# Patient Record
Sex: Female | Born: 1945 | Race: White | Hispanic: No | Smoking: Former smoker
Health system: Southern US, Community
[De-identification: ages and names within clinical notes are randomized; demographics above are authoritative.]

## PROBLEM LIST (undated history)

## (undated) DIAGNOSIS — I82409 Acute embolism and thrombosis of unspecified deep veins of unspecified lower extremity: Secondary | ICD-10-CM

## (undated) DIAGNOSIS — R112 Nausea with vomiting, unspecified: Secondary | ICD-10-CM

## (undated) DIAGNOSIS — S3992XA Unspecified injury of lower back, initial encounter: Secondary | ICD-10-CM

## (undated) DIAGNOSIS — K3189 Other diseases of stomach and duodenum: Secondary | ICD-10-CM

## (undated) DIAGNOSIS — I10 Essential (primary) hypertension: Secondary | ICD-10-CM

## (undated) DIAGNOSIS — I484 Atypical atrial flutter: Secondary | ICD-10-CM

## (undated) DIAGNOSIS — D490 Neoplasm of unspecified behavior of digestive system: Secondary | ICD-10-CM

## (undated) DIAGNOSIS — D689 Coagulation defect, unspecified: Secondary | ICD-10-CM

## (undated) DIAGNOSIS — M109 Gout, unspecified: Secondary | ICD-10-CM

## (undated) DIAGNOSIS — R1013 Epigastric pain: Secondary | ICD-10-CM

## (undated) DIAGNOSIS — M069 Rheumatoid arthritis, unspecified: Secondary | ICD-10-CM

## (undated) DIAGNOSIS — K269 Duodenal ulcer, unspecified as acute or chronic, without hemorrhage or perforation: Secondary | ICD-10-CM

## (undated) DIAGNOSIS — F411 Generalized anxiety disorder: Secondary | ICD-10-CM

## (undated) DIAGNOSIS — K8309 Other cholangitis: Secondary | ICD-10-CM

## (undated) DIAGNOSIS — E039 Hypothyroidism, unspecified: Secondary | ICD-10-CM

## (undated) DIAGNOSIS — I4891 Unspecified atrial fibrillation: Secondary | ICD-10-CM

## (undated) DIAGNOSIS — K219 Gastro-esophageal reflux disease without esophagitis: Secondary | ICD-10-CM

## (undated) HISTORY — PX: ABDOMINAL HYSTERECTOMY: SHX81

## (undated) HISTORY — DX: Coagulation defect, unspecified: D68.9

## (undated) HISTORY — DX: Duodenal ulcer, unspecified as acute or chronic, without hemorrhage or perforation: K26.9

## (undated) HISTORY — DX: Other diseases of stomach and duodenum: K31.89

## (undated) HISTORY — DX: Unspecified injury of lower back, initial encounter: S39.92XA

## (undated) HISTORY — DX: Epigastric pain: R10.13

## (undated) HISTORY — PX: CHOLECYSTECTOMY: SHX55

## (undated) HISTORY — DX: Nausea with vomiting, unspecified: R11.2

## (undated) HISTORY — DX: Essential (primary) hypertension: I10

## (undated) HISTORY — DX: Other cholangitis: K83.09

## (undated) HISTORY — DX: Neoplasm of unspecified behavior of digestive system: D49.0

## (undated) HISTORY — PX: HERNIA REPAIR: SHX51

## (undated) HISTORY — DX: Atypical atrial flutter: I48.4

---

## 2015-08-18 ENCOUNTER — Encounter (HOSPITAL_COMMUNITY): Payer: Self-pay | Admitting: Emergency Medicine

## 2015-08-18 ENCOUNTER — Emergency Department (HOSPITAL_COMMUNITY)
Admission: EM | Admit: 2015-08-18 | Discharge: 2015-08-18 | Disposition: A | Discharge status: Home / Self Care | Payer: Medicare Other | Attending: Emergency Medicine | Admitting: Emergency Medicine

## 2015-08-18 ENCOUNTER — Other Ambulatory Visit (HOSPITAL_COMMUNITY): Payer: Self-pay | Admitting: Rheumatology

## 2015-08-18 ENCOUNTER — Ambulatory Visit (HOSPITAL_BASED_OUTPATIENT_CLINIC_OR_DEPARTMENT_OTHER)
Admission: RE | Admit: 2015-08-18 | Discharge: 2015-08-18 | Disposition: A | Discharge status: Home / Self Care | Payer: Medicare Other | Source: Ambulatory Visit | Attending: Rheumatology | Admitting: Rheumatology

## 2015-08-18 ENCOUNTER — Other Ambulatory Visit: Payer: Self-pay | Admitting: Rheumatology

## 2015-08-18 DIAGNOSIS — M79661 Pain in right lower leg: Secondary | ICD-10-CM

## 2015-08-18 DIAGNOSIS — M79604 Pain in right leg: Secondary | ICD-10-CM | POA: Insufficient documentation

## 2015-08-18 DIAGNOSIS — I82431 Acute embolism and thrombosis of right popliteal vein: Secondary | ICD-10-CM

## 2015-08-18 DIAGNOSIS — M7989 Other specified soft tissue disorders: Principal | ICD-10-CM

## 2015-08-18 DIAGNOSIS — I82491 Acute embolism and thrombosis of other specified deep vein of right lower extremity: Secondary | ICD-10-CM | POA: Insufficient documentation

## 2015-08-18 DIAGNOSIS — I82441 Acute embolism and thrombosis of right tibial vein: Secondary | ICD-10-CM | POA: Insufficient documentation

## 2015-08-18 DIAGNOSIS — R609 Edema, unspecified: Secondary | ICD-10-CM

## 2015-08-18 DIAGNOSIS — I82411 Acute embolism and thrombosis of right femoral vein: Secondary | ICD-10-CM | POA: Insufficient documentation

## 2015-08-18 DIAGNOSIS — I10 Essential (primary) hypertension: Secondary | ICD-10-CM | POA: Insufficient documentation

## 2015-08-18 DIAGNOSIS — L539 Erythematous condition, unspecified: Secondary | ICD-10-CM

## 2015-08-18 HISTORY — DX: Essential (primary) hypertension: I10

## 2015-08-18 HISTORY — DX: Rheumatoid arthritis, unspecified: M06.9

## 2015-08-18 HISTORY — DX: Gout, unspecified: M10.9

## 2015-08-18 NOTE — ED Notes (Signed)
Pt. reports right leg DVT with pain for 4 days , vascular ultrasound done this evening , denies fever / no SOB . Right pedal pulses present.

## 2015-08-18 NOTE — ED Notes (Signed)
This RN went to pull patient from the lobby, and patient's daughter was with her.  [name] was on phone with PCP and discussed patient's symptoms.  Patient now has an appointment for tomorrow morning with her PCP and daughter states "prescriptions are being called in".  This RN asked patient and daughter if they were sure they wanted to leave, and patient and daughter agreed.

## 2015-08-18 NOTE — Progress Notes (Signed)
VASCULAR LAB PRELIMINARY  PRELIMINARY  PRELIMINARY  PRELIMINARY  Bilateral lower extremity venous duplex completed.     Right:acute thrombus  DVT noted in femoral vein popliteal vein PTV and peroneal veins .  No evidence of superficial thrombosis.  No Baker's cyst.  Left:  No evidence of DVT, superficial thrombosis, or Baker's cyst. Called doctor with results @6 :30 pm  Kedric Bumgarner, RVT, RDMS 08/18/2015, 6:27 PM

## 2015-09-02 ENCOUNTER — Telehealth: Payer: Self-pay | Admitting: Hematology and Oncology

## 2015-09-02 NOTE — Telephone Encounter (Signed)
Completed pt intake. Pt aware of appt 09/07/15 @2 :00

## 2015-09-07 ENCOUNTER — Encounter: Payer: Self-pay | Admitting: Hematology and Oncology

## 2015-09-07 ENCOUNTER — Ambulatory Visit (HOSPITAL_BASED_OUTPATIENT_CLINIC_OR_DEPARTMENT_OTHER): Payer: Medicare Other | Admitting: Hematology and Oncology

## 2015-09-07 VITALS — BP 136/73 | HR 69 | Temp 98.0°F | Resp 17 | Ht 61.0 in | Wt 231.4 lb

## 2015-09-07 DIAGNOSIS — I82401 Acute embolism and thrombosis of unspecified deep veins of right lower extremity: Secondary | ICD-10-CM | POA: Diagnosis not present

## 2015-09-07 DIAGNOSIS — M0579 Rheumatoid arthritis with rheumatoid factor of multiple sites without organ or systems involvement: Secondary | ICD-10-CM | POA: Diagnosis not present

## 2015-09-07 DIAGNOSIS — M069 Rheumatoid arthritis, unspecified: Secondary | ICD-10-CM | POA: Insufficient documentation

## 2015-09-07 NOTE — Assessment & Plan Note (Signed)
The recent right lower extremity DVT could be provoked by mild dehydration and possibly presence of lupus anticoagulant, although unproven. Even though I did not order the blood test to confirm this, the presence of strong evidence of rheumatoid factor and anti-CCP antibiotic in 2015 suggested that she may have lingering lupus anticoagulant, especially the fact that she is not treated. We discussed about the pros and cons about testing for thrombophilia disorder/lupus anticoagulant teating. her current anticoagulation therapy will interfere with some the tests and it is not possible to interpret the test results.  I do not see a reason to order excessive testing to screen for thrombophilia disorder as it would not change our management. With her significant obesity, recurrent dehydration and untreated rheumatoid arthritis made me very concerned about recurrent blood clots. Her risk of recurrence of blood clot if we discontinue anticoagulation therapy after 3 months could be as high as 20% or more. After prolonged discussion, where in agreement for her to remain on anticoagulation indefinitely. First 3 months of anticoagulation therapy is to treat her recent blood clot and any pain beyond that is for secondary prevention. She needs minimum twice a year CBC and renal function study for close monitoring as Eliquis can be associated with 3% risk of bleeding and is cleared by the kidneys. If her creatinine clearance dropped under 35 ml/min, I recommend reducing the dose to 2.5 mg twice a day. Finally, at the end of our consultation today, I reinforced the importance of preventive strategies such as avoiding hormonal supplement, avoiding cigarette smoking, keeping up-to-date with screening programs for early cancer detection, frequent ambulation for long distance travel and aggressive DVT prophylaxis in all surgical settings. I have not made a return appointment for the patient to come back.

## 2015-09-07 NOTE — Progress Notes (Signed)
[facility] NOTE  Patient Care Team: Thurman Coyer, MD as PCP - General (Internal Medicine) Valinda Party, MD as Consulting Physician (Rheumatology)  CHIEF COMPLAINTS/PURPOSE OF CONSULTATION:  Acute right lower extremity DVT  HISTORY OF PRESENTING ILLNESS:  Peggy Armstrong 70 y.o. 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 In 2015, she tested positive for rheumatoid factor and anti-CCP antibodies and saw a rheumatologist but was never started on treatment for rheumatoid arthritis She has been complaining of intermittent right lower extremity pain around the knee and the back of the knee since January 2017. She was originally diagnosed with an acute gout attack but subsequently saw a different physician for follow-up and venous Doppler was ordered on 08/18/2015 which showed findings consistent with acute deep vein thrombosis involving the right lower extremity. Acute thrombus identified in right femoral vein, popliteal vein, posterior tibial veins and peroneal veins. She denies immobility, history of trauma, long distance travel, recent surgery, smoking or prolonged immobilization. She was anticoagulated with Eliquis. The patient denies any recent signs or symptoms of bleeding such as spontaneous epistaxis, hematuria or hematochezia.  She denies further lower extremity swelling, warmth, tenderness & erythema.  She denies recent chest pain on exertion, shortness of breath on minimal exertion, pre-syncopal episodes, hemoptysis, or palpitation. She had no prior history or diagnosis of cancer. Her age appropriate screening programs are not up-to-date as she refuses mammogram and colonoscopy. She had prior surgeries before and never had perioperative thromboembolic events. The patient had been exposed to birth control pills and never had thrombotic events. The patient had been  pregnant before and denies history of peripartum thromboembolic event or history of recurrent miscarriages. There is no family history of blood clots or miscarriages.  MEDICAL HISTORY:    SURGICAL HISTORY: Past Surgical History  Procedure Laterality Date  . Cholecystectomy    . Abdominal hysterectomy    . Hernia repair      SOCIAL HISTORY: Social History   Social History  . Marital Status: Divorced    Spouse Name: N/A  . Number of Children: N/A  . Years of Education: N/A   Occupational History  . Not on file.   Social History Main Topics  . Smoking status: Never Smoker   . Smokeless tobacco: Never Used  . Alcohol Use: No  . Drug Use: No  . 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   Other Topics Concern  . Not on file   Social History Narrative    FAMILY HISTORY: Family History  Problem Relation Age of Onset  . Cancer Father     prostate ca    ALLERGIES:  is allergic to codeine.  MEDICATIONS:  Current Outpatient Prescriptions  Medication Sig Dispense Refill  . allopurinol (ZYLOPRIM) 300 MG tablet Take 450 mg by mouth daily.    Marland Kitchen ALPRAZolam (XANAX) 0.25 MG tablet Take 0.25 mg by mouth 3 (three) times daily as needed.  2  . apixaban (ELIQUIS) 5 MG TABS tablet Take 5 mg by mouth 2 (two) times daily.    . chlorthalidone (HYGROTON) 25 MG tablet Take 25 mg by mouth daily.    . cholecalciferol (VITAMIN D) 1000 units tablet Take 1,000 Units by mouth daily.    . cloNIDine (CATAPRES) 0.2 MG tablet Take 0.2 mg by mouth 2 (two) times daily.    Marland Kitchen diltiazem (CARDIZEM  CD) 300 MG 24 hr capsule Take 300 mg by mouth daily.    Marland Kitchen lisinopril (PRINIVIL,ZESTRIL) 10 MG tablet Take 10 mg by mouth daily.    . vitamin B-12 (CYANOCOBALAMIN) 1000 MCG tablet Take 1,000 mcg by mouth daily.     No current facility-administered medications for this visit.    REVIEW OF SYSTEMS:   Constitutional: Denies fevers, chills or abnormal night sweats Eyes: Denies  blurriness of vision, double vision or watery eyes Ears, nose, mouth, throat, and face: Denies mucositis or sore throat Respiratory: Denies cough, dyspnea or wheezes Cardiovascular: Denies palpitation, chest discomfort  Gastrointestinal:  Denies nausea, heartburn or change in bowel habits Skin: Denies abnormal skin rashes Lymphatics: Denies new lymphadenopathy or easy bruising Neurological:Denies numbness, tingling or new weaknesses Behavioral/Psych: Mood is stable, no new changes  All other systems were reviewed with the patient and are negative.  PHYSICAL EXAMINATION: ECOG PERFORMANCE STATUS: 1 - Symptomatic but completely ambulatory  Filed Vitals:   09/07/15 1407  BP: 136/73  Pulse: 69  Temp: 98 F (36.7 C)  Resp: 17   Filed Weights   09/07/15 1407  Weight: 231 lb 6.4 oz (104.962 kg)    GENERAL:alert, no distress and comfortable. She is morbidly obese SKIN: skin color, texture, turgor are normal, no rashes or significant lesions EYES: normal, conjunctiva are pink and non-injected, sclera clear OROPHARYNX:no exudate, no erythema and lips, buccal mucosa, and tongue normal  NECK: supple, thyroid normal size, non-tender, without nodularity LYMPH:  no palpable lymphadenopathy in the cervical, axillary or inguinal LUNGS: clear to auscultation and percussion with normal breathing effort HEART: regular rate & rhythm and no murmurs and no lower extremity edema ABDOMEN:abdomen soft, non-tender and normal bowel sounds Musculoskeletal:no cyanosis of digits and no clubbing  PSYCH: alert & oriented x 3 with fluent speech NEURO: no focal motor/sensory deficits  LABORATORY DATA:  I have reviewed the data as listed  ASSESSMENT & PLAN:  I reviewed with the patient about the plan for care for acute right DVT.  Right leg DVT (HCC) The recent right lower extremity DVT could be provoked by mild dehydration and possibly presence of lupus anticoagulant, although unproven. Even though I did  not order the blood test to confirm this, the presence of strong evidence of rheumatoid factor and anti-CCP antibiotic in 2015 suggested that she may have lingering lupus anticoagulant, especially the fact that she is not treated. We discussed about the pros and cons about testing for thrombophilia disorder/lupus anticoagulant teating. her current anticoagulation therapy will interfere with some the tests and it is not possible to interpret the test results.  I do not see a reason to order excessive testing to screen for thrombophilia disorder as it would not change our management. With her significant obesity, recurrent dehydration and untreated rheumatoid arthritis made me very concerned about recurrent blood clots. Her risk of recurrence of blood clot if we discontinue anticoagulation therapy after 3 months could be as high as 20% or more. After prolonged discussion, where in agreement for her to remain on anticoagulation indefinitely. First 3 months of anticoagulation therapy is to treat her recent blood clot and any pain beyond that is for secondary prevention. She needs minimum twice a year CBC and renal function study for close monitoring as Eliquis can be associated with 3% risk of bleeding and is cleared by the kidneys. If her creatinine clearance dropped under 35 ml/min, I recommend reducing the dose to 2.5 mg twice a day. Finally, at the end  of our consultation today, I reinforced the importance of preventive strategies such as avoiding hormonal supplement, avoiding cigarette smoking, keeping up-to-date with screening programs for early cancer detection, frequent ambulation for long distance travel and aggressive DVT prophylaxis in all surgical settings. I have not made a return appointment for the patient to come back.   RA (rheumatoid arthritis) ([place]) She is not symptomatic is currently taking calcium and vitamin D only. I would defer to her primary care doctor and rheumatologist for further  follow-up. Even though not proven, I suspect she may have lupus anticoagulant that may have precipitated recent diagnosis of acute DVT.    All questions were answered. The patient knows to call the clinic with any problems, questions or concerns. I spent 40 minutes counseling the patient face to face. The total time spent in the appointment was 55 minutes and more than 50% was on counseling.     [facility], Jaramiah Bossard, MD 09/07/2015 3:07 PM

## 2015-09-07 NOTE — Assessment & Plan Note (Signed)
She is not symptomatic is currently taking calcium and vitamin D only. I would defer to her primary care doctor and rheumatologist for further follow-up. Even though not proven, I suspect she may have lupus anticoagulant that may have precipitated recent diagnosis of acute DVT.

## 2016-08-01 ENCOUNTER — Telehealth: Payer: Self-pay

## 2016-08-01 NOTE — Telephone Encounter (Signed)
Patient called and states  Dr. Thea Silversmith wants to refer her to get IV iron, and made a referral to [facility] But she does not want to see anyone in [place]. She has seen Dr. Alvy Bimler in the past and would like to come her to get her iron. Dr. Alvy Bimler last saw her in March 2017.

## 2016-08-02 ENCOUNTER — Telehealth: Payer: Self-pay | Admitting: Hematology and Oncology

## 2016-08-02 ENCOUNTER — Other Ambulatory Visit: Payer: Self-pay | Admitting: Hematology and Oncology

## 2016-08-02 DIAGNOSIS — D500 Iron deficiency anemia secondary to blood loss (chronic): Secondary | ICD-10-CM

## 2016-08-02 DIAGNOSIS — D509 Iron deficiency anemia, unspecified: Secondary | ICD-10-CM

## 2016-08-02 HISTORY — DX: Iron deficiency anemia, unspecified: D50.9

## 2016-08-02 NOTE — Telephone Encounter (Signed)
I have placed scheduling msg to see her on 2/15 for labs, see me and IV iron

## 2016-08-02 NOTE — Telephone Encounter (Signed)
sw pt to confirm 2/15 appts at 915 am per LOS

## 2016-08-02 NOTE — Telephone Encounter (Signed)
"  My family Practice Dr. Thea Silversmith in [place] says I need sequential  iron infusions. My iron level is low and keeps dropping.  My Hgb = 10 in November and last week Hgb = 10.8.  He was going to send me to a [place] provider but [facility] is convenient.  I live in [place] and my daughter works at [employer].   Dr. Thea Silversmith thinks it's fine for me to see Dr. Alvy Bimler."  Denies chest pain, S.O.B., diaphoresis.  "I feel blah for the past several months.  I have to rest and take breaks after thirty minutes walking in [place], I'm worn out.  I vacuum and rest immediately.   Rest after doing things around the house.  I started iron 65 mg once a day with vitamin C last week.  Return number [phone]."    Will notify provider and also asked she contact PCP for labs, records, treatment ideas/orders to be faxed to [facility] for this new diagnosis.

## 2016-08-03 ENCOUNTER — Telehealth: Payer: Self-pay | Admitting: Hematology and Oncology

## 2016-08-03 NOTE — Telephone Encounter (Signed)
Girard and made aware of 02/15 appt.  Moved office note over into EPIC.

## 2016-08-09 ENCOUNTER — Telehealth: Payer: Self-pay | Admitting: *Deleted

## 2016-08-09 NOTE — Telephone Encounter (Signed)
Pt is very nervous about her upcoming appt. She has a rash on the back of her neck. A shower feels good, she is using dermacell lotion. She will be getting some benadryl cream today. She will see Dr Alvy Bimler on 2/15 before her infusion. She states she feels better for having talked to [place].

## 2016-08-09 NOTE — Telephone Encounter (Signed)
"  How long will I be there for treatment?  My daughter needs to work and if It's not an injection, she can drop me off to go to work." Peggy Armstrong will be in a 100 ml bag.  Appointments begin at 9:15 am and should end by 1:00 pm.

## 2016-08-11 ENCOUNTER — Ambulatory Visit (HOSPITAL_BASED_OUTPATIENT_CLINIC_OR_DEPARTMENT_OTHER): Payer: Medicare Other

## 2016-08-11 ENCOUNTER — Encounter: Payer: Self-pay | Admitting: Hematology and Oncology

## 2016-08-11 ENCOUNTER — Ambulatory Visit (HOSPITAL_BASED_OUTPATIENT_CLINIC_OR_DEPARTMENT_OTHER): Payer: Medicare Other | Admitting: Hematology and Oncology

## 2016-08-11 ENCOUNTER — Other Ambulatory Visit (HOSPITAL_BASED_OUTPATIENT_CLINIC_OR_DEPARTMENT_OTHER): Payer: Medicare Other

## 2016-08-11 ENCOUNTER — Other Ambulatory Visit: Payer: Self-pay | Admitting: Hematology and Oncology

## 2016-08-11 VITALS — BP 117/54 | HR 51 | Temp 97.5°F | Resp 18

## 2016-08-11 DIAGNOSIS — D500 Iron deficiency anemia secondary to blood loss (chronic): Secondary | ICD-10-CM

## 2016-08-11 DIAGNOSIS — K089 Disorder of teeth and supporting structures, unspecified: Secondary | ICD-10-CM

## 2016-08-11 DIAGNOSIS — I82401 Acute embolism and thrombosis of unspecified deep veins of right lower extremity: Secondary | ICD-10-CM

## 2016-08-11 DIAGNOSIS — D509 Iron deficiency anemia, unspecified: Secondary | ICD-10-CM | POA: Diagnosis not present

## 2016-08-11 DIAGNOSIS — I82501 Chronic embolism and thrombosis of unspecified deep veins of right lower extremity: Secondary | ICD-10-CM | POA: Diagnosis not present

## 2016-08-11 LAB — COMPREHENSIVE METABOLIC PANEL
ALT: 18 U/L (ref 0–55)
AST: 16 U/L (ref 5–34)
Albumin: 3.3 g/dL — ABNORMAL LOW (ref 3.5–5.0)
Alkaline Phosphatase: 121 U/L (ref 40–150)
Anion Gap: 10 mEq/L (ref 3–11)
BUN: 23.3 mg/dL (ref 7.0–26.0)
CALCIUM: 9.6 mg/dL (ref 8.4–10.4)
CHLORIDE: 106 meq/L (ref 98–109)
CO2: 25 meq/L (ref 22–29)
Creatinine: 1.3 mg/dL — ABNORMAL HIGH (ref 0.6–1.1)
EGFR: 42 mL/min/{1.73_m2} — ABNORMAL LOW (ref >90)
Glucose: 119 mg/dl (ref 70–140)
POTASSIUM: 3.4 meq/L — AB (ref 3.5–5.1)
SODIUM: 142 meq/L (ref 136–145)
Total Bilirubin: 0.36 mg/dL (ref 0.20–1.20)
Total Protein: 6.8 g/dL (ref 6.4–8.3)

## 2016-08-11 LAB — CBC & DIFF AND RETIC
BASO%: 0.1 % (ref 0.0–2.0)
Basophils Absolute: 0 10*3/uL (ref 0.0–0.1)
EOS%: 5.9 % (ref 0.0–7.0)
Eosinophils Absolute: 0.4 10*3/uL (ref 0.0–0.5)
HCT: 32.8 % — ABNORMAL LOW (ref 34.8–46.6)
HGB: 10.2 g/dL — ABNORMAL LOW (ref 11.6–15.9)
Immature Retic Fract: 11.5 % — ABNORMAL HIGH (ref 1.60–10.00)
LYMPH#: 1 10*3/uL (ref 0.9–3.3)
LYMPH%: 14.4 % (ref 14.0–49.7)
MCH: 26.2 pg (ref 25.1–34.0)
MCHC: 31.1 g/dL — AB (ref 31.5–36.0)
MCV: 84.1 fL (ref 79.5–101.0)
MONO#: 0.6 10*3/uL (ref 0.1–0.9)
MONO%: 8.8 % (ref 0.0–14.0)
NEUT%: 70.8 % (ref 38.4–76.8)
NEUTROS ABS: 5 10*3/uL (ref 1.5–6.5)
PLATELETS: 227 10*3/uL (ref 145–400)
RBC: 3.9 10*6/uL (ref 3.70–5.45)
RDW: 18.5 % — AB (ref 11.2–14.5)
RETIC %: 2.11 % — AB (ref 0.70–2.10)
RETIC CT ABS: 82.29 10*3/uL (ref 33.70–90.70)
WBC: 7.1 10*3/uL (ref 3.9–10.3)

## 2016-08-11 LAB — FERRITIN: FERRITIN: 20 ng/mL (ref 9–269)

## 2016-08-11 MED ORDER — SODIUM CHLORIDE 0.9 % IV SOLN
Freq: Once | INTRAVENOUS | Status: AC
  Administered 2016-08-11: 11:00:00 via INTRAVENOUS

## 2016-08-11 MED ORDER — SODIUM CHLORIDE 0.9 % IV SOLN
510.0000 mg | Freq: Once | INTRAVENOUS | Status: AC
  Administered 2016-08-11: 510 mg via INTRAVENOUS
  Filled 2016-08-11: qty 17

## 2016-08-11 NOTE — Patient Instructions (Signed)
Ferumoxytol injection What is this medicine? FERUMOXYTOL is an iron complex. Iron is used to make healthy red blood cells, which carry oxygen and nutrients throughout the body. This medicine is used to treat iron deficiency anemia in people with chronic kidney disease. COMMON BRAND NAME(S): Feraheme What should I tell my health care provider before I take this medicine? They need to know if you have any of these conditions: -anemia not caused by low iron levels -high levels of iron in the blood -magnetic resonance imaging (MRI) test scheduled -an unusual or allergic reaction to iron, other medicines, foods, dyes, or preservatives -pregnant or trying to get pregnant -breast-feeding How should I use this medicine? This medicine is for injection into a vein. It is given by a health care professional in a hospital or clinic setting. Talk to your pediatrician regarding the use of this medicine in children. Special care may be needed. What if I miss a dose? It is important not to miss your dose. Call your doctor or health care professional if you are unable to keep an appointment. What may interact with this medicine? This medicine may interact with the following medications: -other iron products What should I watch for while using this medicine? Visit your doctor or healthcare professional regularly. Tell your doctor or healthcare professional if your symptoms do not start to get better or if they get worse. You may need blood work done while you are taking this medicine. You may need to follow a special diet. Talk to your doctor. Foods that contain iron include: whole grains/cereals, dried fruits, beans, or peas, leafy green vegetables, and organ meats (liver, kidney). What side effects may I notice from receiving this medicine? Side effects that you should report to your doctor or health care professional as soon as possible: -allergic reactions like skin rash, itching or hives, swelling of the  face, lips, or tongue -breathing problems -changes in blood pressure -feeling faint or lightheaded, falls -fever or chills -flushing, sweating, or hot feelings -swelling of the ankles or feet Side effects that usually do not require medical attention (report to your doctor or health care professional if they continue or are bothersome): -diarrhea -headache -nausea, vomiting -stomach pain Where should I keep my medicine? This drug is given in a hospital or clinic and will not be stored at home.  2017 Elsevier/Gold Standard (2015-07-16 12:41:49)  

## 2016-08-14 DIAGNOSIS — K089 Disorder of teeth and supporting structures, unspecified: Secondary | ICD-10-CM

## 2016-08-14 HISTORY — DX: Disorder of teeth and supporting structures, unspecified: K08.9

## 2016-08-14 NOTE — Assessment & Plan Note (Addendum)
The recent right lower extremity DVT could be provoked by mild dehydration and possibly presence of lupus anticoagulant, although unproven. Even though I did not order the blood test to confirm this, the presence of strong evidence of rheumatoid factor and anti-CCP antibiotic in 2015 suggested that she may have lingering lupus anticoagulant, especially the fact that she is not treated. We discussed about the pros and cons about testing for thrombophilia disorder/lupus anticoagulant teating. her current anticoagulation therapy will interfere with some the tests and it is not possible to interpret the test results.  I do not see a reason to order excessive testing to screen for thrombophilia disorder as it would not change our management. With her significant obesity, recurrent dehydration and untreated rheumatoid arthritis made me very concerned about recurrent blood clots. Her risk of recurrence of blood clot if we discontinue anticoagulation therapy after 3 months could be as high as 20% or more. After prolonged discussion, we are in agreement for her to remain on anticoagulation indefinitely. First 3 months of anticoagulation therapy is to treat her recent blood clot and any pain beyond that is for secondary prevention. She needs minimum twice a year CBC and renal function study for close monitoring as Eliquis can be associated with 3% risk of bleeding and is cleared by the kidneys. Due to recent bleeding and borderline kidney function, I recommend reducing the dose of Eliquis to 2.5 mg twice a day, indefinitely Finally, at the end of our consultation today, I reinforced the importance of preventive strategies such as avoiding hormonal supplement, avoiding cigarette smoking, keeping up-to-date with screening programs for early cancer detection, frequent ambulation for long distance travel and aggressive DVT prophylaxis in all surgical settings. I have not made a return appointment for the patient to come  back.

## 2016-08-14 NOTE — Assessment & Plan Note (Signed)
The most likely cause of her anemia is due to chronic blood loss/malabsorption syndrome. We discussed some of the risks, benefits, and alternatives of intravenous iron infusions. The patient is symptomatic from anemia and the iron level is critically low. She tolerated oral iron supplement poorly and desires to achieved higher levels of iron faster for adequate hematopoesis. Some of the side-effects to be expected including risks of infusion reactions, phlebitis, headaches, nausea and fatigue.  The patient is willing to proceed. Patient education material was dispensed.  Goal is to keep ferritin level greater than 50 After 2 doses of IV iron, I recommend close follow-up with primary care doctor for blood count monitoring. I have not made a return appointment to see her back but will see her back again in the future if needed for further IV iron treatment

## 2016-08-14 NOTE — Progress Notes (Signed)
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 In 2015, she tested positive for rheumatoid factor and anti-CCP antibodies and saw a rheumatologist but was never started on treatment for rheumatoid arthritis She has been complaining of intermittent right lower extremity pain around the knee and the back of the knee since January 2017. She was originally diagnosed with an acute gout attack but subsequently saw a different physician for follow-up and venous Doppler was ordered on 08/18/2015 which showed findings consistent with acute deep vein thrombosis involving the right lower extremity. Acute thrombus identified in right femoral vein, popliteal vein, posterior tibial veins and peroneal veins. She denies immobility, history of trauma, long distance travel, recent surgery, smoking or prolonged immobilization. She was anticoagulated with Eliquis. The patient denies any recent signs or symptoms of bleeding such as spontaneous epistaxis, hematuria or hematochezia.  She denies further lower extremity swelling, warmth, tenderness & erythema.  She denies recent chest pain on exertion, shortness of breath on minimal exertion, pre-syncopal episodes, hemoptysis, or palpitation. She had no prior history or diagnosis of cancer. Her age appropriate screening programs are not up-to-date as she refuses mammogram and colonoscopy. She had prior surgeries before and never had perioperative thromboembolic events. The patient had been exposed to birth control pills and never had thrombotic events. The patient had been pregnant before and denies history of peripartum  thromboembolic event or history of recurrent miscarriages. There is no family history of blood clots or miscarriages.  INTERVAL HISTORY: Peggy Armstrong 71 y.o. female returns for further follow-up. She was recently found to have iron deficiency anemia. She wake up with blood on her pillow.  Subsequently, it was found that she has very poor dentition requiring dental extraction. She attempted to take oral iron supplement but unable to tolerate iron supplement.  She is seen for possible intravenous iron infusion. Apart from oral bleeding, she denies epistaxis, hematuria or hematochezia. Her mobility is poor.  She is morbidly obese with chronic rheumatoid arthritis/joint pain  I have reviewed the past medical history, past surgical history, social history and family history with the patient and they are unchanged from previous note.  ALLERGIES:  is allergic to codeine.  MEDICATIONS:  Current Outpatient Prescriptions  Medication Sig Dispense Refill  . allopurinol (ZYLOPRIM) 300 MG tablet Take 450 mg by mouth daily.    Marland Kitchen ALPRAZolam (XANAX) 0.25 MG tablet Take 0.25 mg by mouth 3 (three) times daily as needed.  2  . apixaban (ELIQUIS) 5 MG TABS tablet Take 5 mg by mouth 2 (two) times daily.    Marland Kitchen atorvastatin (LIPITOR) 10 MG tablet Take 10 mg by mouth daily.  3  . chlorthalidone (HYGROTON) 25 MG tablet Take 25 mg by mouth daily.    . cholecalciferol (VITAMIN D) 1000 units tablet Take 1,000 Units by mouth daily.    . cloNIDine (CATAPRES) 0.2 MG tablet Take 0.2 mg by mouth 2 (two) times daily.    Marland Kitchen diltiazem (CARDIZEM CD) 300 MG 24 hr capsule Take 300 mg by mouth daily.    Marland Kitchen lisinopril (PRINIVIL,ZESTRIL) 10 MG tablet Take 10 mg by mouth daily.    . penicillin v potassium (VEETID) 500 MG tablet Take  500 mg by mouth 4 (four) times daily.  0  . vitamin B-12 (CYANOCOBALAMIN) 1000 MCG tablet Take 1,000 mcg by mouth daily.    . sertraline (ZOLOFT) 50 MG tablet Take 50 mg by mouth daily.     No  current facility-administered medications for this visit.      REVIEW OF SYSTEMS:   Constitutional: Denies fevers, chills or night sweats Eyes: Denies blurriness of vision Ears, nose, mouth, throat, and face: Denies mucositis or sore throat Respiratory: Denies cough, dyspnea or wheezes Cardiovascular: Denies palpitation, chest discomfort or lower extremity swelling Gastrointestinal:  Denies nausea, heartburn or change in bowel habits Skin: Denies abnormal skin rashes Lymphatics: Denies new lymphadenopathy or easy bruising Neurological:Denies numbness, tingling or new weaknesses Behavioral/Psych: Mood is stable, no new changes  All other systems were reviewed with the patient and are negative.  PHYSICAL EXAMINATION: ECOG PERFORMANCE STATUS: 1 - Symptomatic but completely ambulatory  Vitals:   08/11/16 0948  BP: 122/70  Pulse: 79  Resp: 18  Temp: 98.4 F (36.9 C)   Filed Weights   08/11/16 0948  Weight: 239 lb 8 oz (108.6 kg)    GENERAL:alert, no distress and comfortable SKIN: skin color, texture, turgor are normal, no rashes or significant lesions EYES: normal, Conjunctiva are pink and non-injected, sclera clear OROPHARYNX:no exudate, no erythema and lips, buccal mucosa, and tongue normal  NECK: supple, thyroid normal size, non-tender, without nodularity LYMPH:  no palpable lymphadenopathy in the cervical, axillary or inguinal LUNGS: clear to auscultation and percussion with normal breathing effort HEART: regular rate & rhythm and no murmurs and no lower extremity edema ABDOMEN:abdomen soft, non-tender and normal bowel sounds Musculoskeletal:no cyanosis of digits and no clubbing  NEURO: alert & oriented x 3 with fluent speech, no focal motor/sensory deficits  LABORATORY DATA:  I have reviewed the data as listed     Component Value Date/Time   NA 142 08/11/2016 0936   K 3.4 (L) 08/11/2016 0936   CO2 25 08/11/2016 0936   GLUCOSE 119 08/11/2016 0936   BUN 23.3  08/11/2016 0936   CREATININE 1.3 (H) 08/11/2016 0936   CALCIUM 9.6 08/11/2016 0936   PROT 6.8 08/11/2016 0936   ALBUMIN 3.3 (L) 08/11/2016 0936   AST 16 08/11/2016 0936   ALT 18 08/11/2016 0936   ALKPHOS 121 08/11/2016 0936   BILITOT 0.36 08/11/2016 0936    No results found for: SPEP, UPEP  Lab Results  Component Value Date   WBC 7.1 08/11/2016   NEUTROABS 5.0 08/11/2016   HGB 10.2 (L) 08/11/2016   HCT 32.8 (L) 08/11/2016   MCV 84.1 08/11/2016   PLT 227 08/11/2016      Chemistry      Component Value Date/Time   NA 142 08/11/2016 0936   K 3.4 (L) 08/11/2016 0936   CO2 25 08/11/2016 0936   BUN 23.3 08/11/2016 0936   CREATININE 1.3 (H) 08/11/2016 0936      Component Value Date/Time   CALCIUM 9.6 08/11/2016 0936   ALKPHOS 121 08/11/2016 0936   AST 16 08/11/2016 0936   ALT 18 08/11/2016 0936   BILITOT 0.36 08/11/2016 0936      ASSESSMENT & PLAN:  Iron deficiency anemia The most likely cause of her anemia is due to chronic blood loss/malabsorption syndrome. We discussed some of the risks, benefits, and alternatives of intravenous iron infusions. The patient is symptomatic from anemia and the iron level is critically low. She tolerated oral iron supplement poorly and desires to achieved  higher levels of iron faster for adequate hematopoesis. Some of the side-effects to be expected including risks of infusion reactions, phlebitis, headaches, nausea and fatigue.  The patient is willing to proceed. Patient education material was dispensed.  Goal is to keep ferritin level greater than 50 After 2 doses of IV iron, I recommend close follow-up with primary care doctor for blood count monitoring. I have not made a return appointment to see her back but will see her back again in the future if needed for further IV iron treatment   Right leg DVT ([place]) The recent right lower extremity DVT could be provoked by mild dehydration and possibly presence of lupus anticoagulant, although  unproven. Even though I did not order the blood test to confirm this, the presence of strong evidence of rheumatoid factor and anti-CCP antibiotic in 2015 suggested that she may have lingering lupus anticoagulant, especially the fact that she is not treated. We discussed about the pros and cons about testing for thrombophilia disorder/lupus anticoagulant teating. her current anticoagulation therapy will interfere with some the tests and it is not possible to interpret the test results.  I do not see a reason to order excessive testing to screen for thrombophilia disorder as it would not change our management. With her significant obesity, recurrent dehydration and untreated rheumatoid arthritis made me very concerned about recurrent blood clots. Her risk of recurrence of blood clot if we discontinue anticoagulation therapy after 3 months could be as high as 20% or more. After prolonged discussion, we are in agreement for her to remain on anticoagulation indefinitely. First 3 months of anticoagulation therapy is to treat her recent blood clot and any pain beyond that is for secondary prevention. She needs minimum twice a year CBC and renal function study for close monitoring as Eliquis can be associated with 3% risk of bleeding and is cleared by the kidneys. Due to recent bleeding and borderline kidney function, I recommend reducing the dose of Eliquis to 2.5 mg twice a day, indefinitely Finally, at the end of our consultation today, I reinforced the importance of preventive strategies such as avoiding hormonal supplement, avoiding cigarette smoking, keeping up-to-date with screening programs for early cancer detection, frequent ambulation for long distance travel and aggressive DVT prophylaxis in all surgical settings. I have not made a return appointment for the patient to come back.   Poor dentition She has significant poor dentition.  Dental extraction is recommended. She requested a letter to bring to  her dentist for clearance. I furnished her a letter of clearance from the hematology standpoint. I recommend discontinuation of oral anticoagulation therapy for at least 48 hours before surgery and resume the following day. There is no bridging therapy needed   No orders of the defined types were placed in this encounter.   All questions were answered. The patient knows to call the clinic with any problems, questions or concerns. No barriers to learning was detected.  I spent 30 minutes counseling the patient face to face. The total time spent in the appointment was 40 minutes and more than 50% was on counseling.     Heath Lark, MD 2/18/20185:53 AM

## 2016-08-14 NOTE — Assessment & Plan Note (Signed)
She has significant poor dentition.  Dental extraction is recommended. She requested a letter to bring to her dentist for clearance. I furnished her a letter of clearance from the hematology standpoint. I recommend discontinuation of oral anticoagulation therapy for at least 48 hours before surgery and resume the following day. There is no bridging therapy needed

## 2016-08-18 ENCOUNTER — Ambulatory Visit (HOSPITAL_BASED_OUTPATIENT_CLINIC_OR_DEPARTMENT_OTHER): Payer: Medicare Other

## 2016-08-18 VITALS — BP 124/75 | HR 62 | Temp 98.1°F | Resp 18

## 2016-08-18 DIAGNOSIS — D500 Iron deficiency anemia secondary to blood loss (chronic): Secondary | ICD-10-CM

## 2016-08-18 DIAGNOSIS — D509 Iron deficiency anemia, unspecified: Secondary | ICD-10-CM

## 2016-08-18 MED ORDER — SODIUM CHLORIDE 0.9 % IV SOLN
510.0000 mg | Freq: Once | INTRAVENOUS | Status: AC
  Administered 2016-08-18: 510 mg via INTRAVENOUS
  Filled 2016-08-18: qty 17

## 2016-08-18 MED ORDER — SODIUM CHLORIDE 0.9 % IV SOLN
Freq: Once | INTRAVENOUS | Status: AC
  Administered 2016-08-18: 16:00:00 via INTRAVENOUS

## 2016-08-18 NOTE — Patient Instructions (Signed)
Ferumoxytol injection What is this medicine? FERUMOXYTOL is an iron complex. Iron is used to make healthy red blood cells, which carry oxygen and nutrients throughout the body. This medicine is used to treat iron deficiency anemia in people with chronic kidney disease. COMMON BRAND NAME(S): Feraheme What should I tell my health care provider before I take this medicine? They need to know if you have any of these conditions: -anemia not caused by low iron levels -high levels of iron in the blood -magnetic resonance imaging (MRI) test scheduled -an unusual or allergic reaction to iron, other medicines, foods, dyes, or preservatives -pregnant or trying to get pregnant -breast-feeding How should I use this medicine? This medicine is for injection into a vein. It is given by a health care professional in a hospital or clinic setting. Talk to your pediatrician regarding the use of this medicine in children. Special care may be needed. What if I miss a dose? It is important not to miss your dose. Call your doctor or health care professional if you are unable to keep an appointment. What may interact with this medicine? This medicine may interact with the following medications: -other iron products What should I watch for while using this medicine? Visit your doctor or healthcare professional regularly. Tell your doctor or healthcare professional if your symptoms do not start to get better or if they get worse. You may need blood work done while you are taking this medicine. You may need to follow a special diet. Talk to your doctor. Foods that contain iron include: whole grains/cereals, dried fruits, beans, or peas, leafy green vegetables, and organ meats (liver, kidney). What side effects may I notice from receiving this medicine? Side effects that you should report to your doctor or health care professional as soon as possible: -allergic reactions like skin rash, itching or hives, swelling of the  face, lips, or tongue -breathing problems -changes in blood pressure -feeling faint or lightheaded, falls -fever or chills -flushing, sweating, or hot feelings -swelling of the ankles or feet Side effects that usually do not require medical attention (report to your doctor or health care professional if they continue or are bothersome): -diarrhea -headache -nausea, vomiting -stomach pain Where should I keep my medicine? This drug is given in a hospital or clinic and will not be stored at home.  2017 Elsevier/Gold Standard (2015-07-16 12:41:49)  

## 2017-02-28 ENCOUNTER — Other Ambulatory Visit: Payer: Self-pay | Admitting: Hematology and Oncology

## 2017-02-28 ENCOUNTER — Telehealth: Payer: Self-pay | Admitting: *Deleted

## 2017-02-28 NOTE — Telephone Encounter (Signed)
LM to see if pt can come in on 9/14 @ 1030 for labs, see Dr Alvy Bimler at 1115 and then have iron infusion.  Requested return call to confirm

## 2017-03-01 NOTE — Telephone Encounter (Signed)
Called patient regarding lab results. Dr Alvy Bimler would like her to come on 9/14 @ 1015 for labs, see Dr Alvy Bimler and then receive IV Iron. Pt verbalized understanding. She will call her daughter to arrange transportation  Message to scheduler

## 2017-03-03 ENCOUNTER — Telehealth: Payer: Self-pay | Admitting: Hematology and Oncology

## 2017-03-03 NOTE — Telephone Encounter (Signed)
Spoke with patient regarding having to schedule her iv [facility].

## 2017-03-10 ENCOUNTER — Non-Acute Institutional Stay (HOSPITAL_COMMUNITY): Payer: Medicare Other

## 2017-03-10 ENCOUNTER — Encounter: Payer: Self-pay | Admitting: Hematology and Oncology

## 2017-03-10 ENCOUNTER — Telehealth: Payer: Self-pay | Admitting: Hematology and Oncology

## 2017-03-10 ENCOUNTER — Other Ambulatory Visit (HOSPITAL_BASED_OUTPATIENT_CLINIC_OR_DEPARTMENT_OTHER): Payer: Medicare Other

## 2017-03-10 ENCOUNTER — Other Ambulatory Visit: Payer: Self-pay | Admitting: Hematology and Oncology

## 2017-03-10 ENCOUNTER — Ambulatory Visit (HOSPITAL_BASED_OUTPATIENT_CLINIC_OR_DEPARTMENT_OTHER): Payer: Medicare Other | Admitting: Hematology and Oncology

## 2017-03-10 ENCOUNTER — Ambulatory Visit (HOSPITAL_BASED_OUTPATIENT_CLINIC_OR_DEPARTMENT_OTHER): Payer: Medicare Other

## 2017-03-10 VITALS — BP 103/62 | HR 55 | Temp 97.8°F | Resp 18

## 2017-03-10 DIAGNOSIS — M0579 Rheumatoid arthritis with rheumatoid factor of multiple sites without organ or systems involvement: Secondary | ICD-10-CM

## 2017-03-10 DIAGNOSIS — D500 Iron deficiency anemia secondary to blood loss (chronic): Secondary | ICD-10-CM

## 2017-03-10 DIAGNOSIS — D509 Iron deficiency anemia, unspecified: Secondary | ICD-10-CM | POA: Diagnosis not present

## 2017-03-10 DIAGNOSIS — I82501 Chronic embolism and thrombosis of unspecified deep veins of right lower extremity: Secondary | ICD-10-CM

## 2017-03-10 LAB — CBC WITH DIFFERENTIAL/PLATELET
BASO%: 0.3 % (ref 0.0–2.0)
Basophils Absolute: 0 10*3/uL (ref 0.0–0.1)
EOS ABS: 0.3 10*3/uL (ref 0.0–0.5)
EOS%: 3.9 % (ref 0.0–7.0)
HEMATOCRIT: 32.5 % — AB (ref 34.8–46.6)
HEMOGLOBIN: 10 g/dL — AB (ref 11.6–15.9)
LYMPH#: 1 10*3/uL (ref 0.9–3.3)
LYMPH%: 14 % (ref 14.0–49.7)
MCH: 25.5 pg (ref 25.1–34.0)
MCHC: 30.8 g/dL — ABNORMAL LOW (ref 31.5–36.0)
MCV: 82.9 fL (ref 79.5–101.0)
MONO#: 0.7 10*3/uL (ref 0.1–0.9)
MONO%: 9.8 % (ref 0.0–14.0)
NEUT%: 72 % (ref 38.4–76.8)
NEUTROS ABS: 5.3 10*3/uL (ref 1.5–6.5)
PLATELETS: 251 10*3/uL (ref 145–400)
RBC: 3.92 10*6/uL (ref 3.70–5.45)
RDW: 16.3 % — AB (ref 11.2–14.5)
WBC: 7.4 10*3/uL (ref 3.9–10.3)

## 2017-03-10 LAB — FERRITIN: Ferritin: 14 ng/ml (ref 9–269)

## 2017-03-10 MED ORDER — SODIUM CHLORIDE 0.9 % IV SOLN
510.0000 mg | Freq: Once | INTRAVENOUS | Status: AC
  Administered 2017-03-10: 510 mg via INTRAVENOUS
  Filled 2017-03-10: qty 17

## 2017-03-10 MED ORDER — SODIUM CHLORIDE 0.9 % IV SOLN
Freq: Once | INTRAVENOUS | Status: AC
  Administered 2017-03-10: 11:00:00 via INTRAVENOUS

## 2017-03-10 NOTE — Assessment & Plan Note (Signed)
She is not symptomatic is currently taking calcium and vitamin D only. I would defer to her primary care doctor and rheumatologist for further follow-up. Even though not proven, I suspect she may have lupus anticoagulant that may have precipitated recent diagnosis of DVT. With her poor mobility from rheumatoid arthritis, I felt that it is best to leave her on low-dose chronic anticoagulation therapy indefinitely

## 2017-03-10 NOTE — Patient Instructions (Signed)
Ferumoxytol injection What is this medicine? FERUMOXYTOL is an iron complex. Iron is used to make healthy red blood cells, which carry oxygen and nutrients throughout the body. This medicine is used to treat iron deficiency anemia in people with chronic kidney disease. COMMON BRAND NAME(S): Feraheme What should I tell my health care provider before I take this medicine? They need to know if you have any of these conditions: -anemia not caused by low iron levels -high levels of iron in the blood -magnetic resonance imaging (MRI) test scheduled -an unusual or allergic reaction to iron, other medicines, foods, dyes, or preservatives -pregnant or trying to get pregnant -breast-feeding How should I use this medicine? This medicine is for injection into a vein. It is given by a health care professional in a hospital or clinic setting. Talk to your pediatrician regarding the use of this medicine in children. Special care may be needed. What if I miss a dose? It is important not to miss your dose. Call your doctor or health care professional if you are unable to keep an appointment. What may interact with this medicine? This medicine may interact with the following medications: -other iron products What should I watch for while using this medicine? Visit your doctor or healthcare professional regularly. Tell your doctor or healthcare professional if your symptoms do not start to get better or if they get worse. You may need blood work done while you are taking this medicine. You may need to follow a special diet. Talk to your doctor. Foods that contain iron include: whole grains/cereals, dried fruits, beans, or peas, leafy green vegetables, and organ meats (liver, kidney). What side effects may I notice from receiving this medicine? Side effects that you should report to your doctor or health care professional as soon as possible: -allergic reactions like skin rash, itching or hives, swelling of the  face, lips, or tongue -breathing problems -changes in blood pressure -feeling faint or lightheaded, falls -fever or chills -flushing, sweating, or hot feelings -swelling of the ankles or feet Side effects that usually do not require medical attention (report to your doctor or health care professional if they continue or are bothersome): -diarrhea -headache -nausea, vomiting -stomach pain Where should I keep my medicine? This drug is given in a hospital or clinic and will not be stored at home.  2017 Elsevier/Gold Standard (2015-07-16 12:41:49)  

## 2017-03-10 NOTE — Assessment & Plan Note (Signed)
The recent right lower extremity DVT could be provoked by mild dehydration and possibly presence of lupus anticoagulant, although unproven. Even though I did not order the blood test to confirm this, the presence of strong evidence of rheumatoid factor and anti-CCP antibiotic in 2015 suggested that she may have lingering lupus anticoagulant, especially the fact that she is not treated. We discussed about the pros and cons about testing for thrombophilia disorder/lupus anticoagulant teating. her current anticoagulation therapy will interfere with some the tests and it is not possible to interpret the test results.  I do not see a reason to order excessive testing to screen for thrombophilia disorder as it would not change our management. With her significant obesity, recurrent dehydration and untreated rheumatoid arthritis made me very concerned about recurrent blood clots. Her risk of recurrence of blood clot if we discontinue anticoagulation therapy after 3 months could be as high as 20% or more. After prolonged discussion, we are in agreement for her to remain on anticoagulation indefinitely. First 3 months of anticoagulation therapy is to treat her recent blood clot and any pain beyond that is for secondary prevention. She needs minimum twice a year CBC and renal function study for close monitoring as Eliquis can be associated with 3% risk of bleeding and is cleared by the kidneys. Due to recent bleeding and borderline kidney function, I recommend reducing the dose of Eliquis to 2.5 mg twice a day, indefinitely Finally, at the end of our consultation today, I reinforced the importance of preventive strategies such as avoiding hormonal supplement, avoiding cigarette smoking, keeping up-to-date with screening programs for early cancer detection, frequent ambulation for long distance travel and aggressive DVT prophylaxis in all surgical settings. I told her that the benefit of chronic anticoagulation therapy  will outweigh the risk of not being on anticoagulation therapy

## 2017-03-10 NOTE — Assessment & Plan Note (Signed)
The most likely cause of her anemia is due to chronic blood loss/malabsorption syndrome. We discussed some of the risks, benefits, and alternatives of intravenous iron infusions. The patient is symptomatic from anemia and the iron level is critically low. She tolerated oral iron supplement poorly and desires to achieved higher levels of iron faster for adequate hematopoesis. Some of the side-effects to be expected including risks of infusion reactions, phlebitis, headaches, nausea and fatigue.  The patient is willing to proceed. Patient education material was dispensed.  Goal is to keep ferritin level greater than 50 and resolution of anemia She will get second dose of IV iron in 2 weeks and a plan to recheck her blood count again in December

## 2017-03-10 NOTE — Telephone Encounter (Signed)
Gave patient avs and calendar with appts.  °

## 2017-03-10 NOTE — Progress Notes (Signed)
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 In 2015, she tested positive for rheumatoid factor and anti-CCP antibodies and saw a rheumatologist but was never started on treatment for rheumatoid arthritis She has been complaining of intermittent right lower extremity pain around the knee and the back of the knee since January 2017. She was originally diagnosed with an acute gout attack but subsequently saw a different physician for follow-up and venous Doppler was ordered on 08/18/2015 which showed findings consistent with acute deep vein thrombosis involving the right lower extremity. Acute thrombus identified in right femoral vein, popliteal vein, posterior tibial veins and peroneal veins. She denies immobility, history of trauma, long distance travel, recent surgery, smoking or prolonged immobilization. She was anticoagulated with Eliquis. The patient denies any recent signs or symptoms of bleeding such as spontaneous epistaxis, hematuria or hematochezia.  She denies further lower extremity swelling, warmth, tenderness & erythema.  She denies recent chest pain on exertion, shortness of breath on minimal exertion, pre-syncopal episodes, hemoptysis, or palpitation. She had no prior history or diagnosis of cancer. Her age appropriate screening programs are not up-to-date as she refuses mammogram and colonoscopy. She had prior surgeries before and never had perioperative thromboembolic events. The patient had been exposed to birth control pills and never had thrombotic events. The patient had been pregnant before and denies history of peripartum  thromboembolic event or history of recurrent miscarriages. There is no family history of blood clots or miscarriages. The cause of DVT is likely due to possible lupus anticoagulant and prolonged immobilization.  She was ultimately placed on long-term treatment with Eliquis, low-dose The patient also receive intermittent IV iron for iron deficiency anemia INTERVAL HISTORY: Peggy Armstrong 71 y.o. female returns for further follow-up. She felt fatigue She have concerned of chronic intermittent leg discomfort but recent ultrasound venous Doppler excluded DVT The patient denies any recent signs or symptoms of bleeding such as spontaneous epistaxis, hematuria or hematochezia. She has poor baseline mobility due to chronic arthritis She denies recent chest pain or shortness of breath  I have reviewed the past medical history, past surgical history, social history and family history with the patient and they are unchanged from previous note.  ALLERGIES:  is allergic to codeine.  MEDICATIONS:  Current Outpatient Prescriptions  Medication Sig Dispense Refill  . allopurinol (ZYLOPRIM) 300 MG tablet Take 450 mg by mouth daily.    Marland Kitchen ALPRAZolam (XANAX) 0.25 MG tablet Take 0.25 mg by mouth 3 (three) times daily as needed.  2  . apixaban (ELIQUIS) 5 MG TABS tablet Take 5 mg by mouth 2 (two) times daily.    Marland Kitchen atorvastatin (LIPITOR) 10 MG tablet Take 10 mg by mouth daily.  3  . chlorthalidone (HYGROTON) 25 MG tablet Take 25 mg by mouth daily.    . cholecalciferol (VITAMIN D) 1000 units tablet Take 1,000 Units by mouth daily.    . cloNIDine (CATAPRES) 0.2 MG tablet Take 0.2 mg by mouth 2 (two) times daily.    Marland Kitchen diltiazem (CARDIZEM CD) 300 MG 24 hr capsule Take 300 mg by mouth daily.    Marland Kitchen lisinopril (PRINIVIL,ZESTRIL) 10 MG tablet Take 10 mg by mouth  daily.    . penicillin v potassium (VEETID) 500 MG tablet Take 500 mg by mouth 4 (four) times daily.  0  . sertraline (ZOLOFT) 50 MG tablet Take 50 mg by  mouth daily.    . vitamin B-12 (CYANOCOBALAMIN) 1000 MCG tablet Take 1,000 mcg by mouth daily.     No current facility-administered medications for this visit.      REVIEW OF SYSTEMS:   Constitutional: Denies fevers, chills or night sweats Eyes: Denies blurriness of vision Ears, nose, mouth, throat, and face: Denies mucositis or sore throat Respiratory: Denies cough, dyspnea or wheezes Cardiovascular: Denies palpitation, chest discomfort or lower extremity swelling Gastrointestinal:  Denies nausea, heartburn or change in bowel habits Skin: Denies abnormal skin rashes Lymphatics: Denies new lymphadenopathy or easy bruising Neurological:Denies numbness, tingling or new weaknesses Behavioral/Psych: Mood is stable, no new changes  All other systems were reviewed with the patient and are negative.  PHYSICAL EXAMINATION: ECOG PERFORMANCE STATUS: 1 - Symptomatic but completely ambulatory  Vitals:   03/10/17 0954  BP: 103/72  Pulse: 83  Resp: 18  Temp: 98.4 F (36.9 C)  SpO2: 100%   Filed Weights   03/10/17 0954  Weight: 241 lb 11.2 oz (109.6 kg)    GENERAL:alert, no distress and comfortable.  She is moderately obese and frail in appearance SKIN: skin color, texture, turgor are normal, no rashes or significant lesions EYES: normal, Conjunctiva are pink and non-injected, sclera clear OROPHARYNX:no exudate, no erythema and lips, buccal mucosa, and tongue normal  NECK: supple, thyroid normal size, non-tender, without nodularity LYMPH:  no palpable lymphadenopathy in the cervical, axillary or inguinal LUNGS: clear to auscultation and percussion with normal breathing effort HEART: regular rate & rhythm and no murmurs and no lower extremity edema ABDOMEN:abdomen soft, non-tender and normal bowel sounds Musculoskeletal:no cyanosis of digits and no clubbing  NEURO: alert & oriented x 3 with fluent speech, no focal motor/sensory deficits  LABORATORY DATA:  I have reviewed the data as  listed     Component Value Date/Time   NA 142 08/11/2016 0936   K 3.4 (L) 08/11/2016 0936   CO2 25 08/11/2016 0936   GLUCOSE 119 08/11/2016 0936   BUN 23.3 08/11/2016 0936   CREATININE 1.3 (H) 08/11/2016 0936   CALCIUM 9.6 08/11/2016 0936   PROT 6.8 08/11/2016 0936   ALBUMIN 3.3 (L) 08/11/2016 0936   AST 16 08/11/2016 0936   ALT 18 08/11/2016 0936   ALKPHOS 121 08/11/2016 0936   BILITOT 0.36 08/11/2016 0936    No results found for: SPEP, UPEP  Lab Results  Component Value Date   WBC 7.4 03/10/2017   NEUTROABS 5.3 03/10/2017   HGB 10.0 (L) 03/10/2017   HCT 32.5 (L) 03/10/2017   MCV 82.9 03/10/2017   PLT 251 03/10/2017      Chemistry      Component Value Date/Time   NA 142 08/11/2016 0936   K 3.4 (L) 08/11/2016 0936   CO2 25 08/11/2016 0936   BUN 23.3 08/11/2016 0936   CREATININE 1.3 (H) 08/11/2016 0936      Component Value Date/Time   CALCIUM 9.6 08/11/2016 0936   ALKPHOS 121 08/11/2016 0936   AST 16 08/11/2016 0936   ALT 18 08/11/2016 0936   BILITOT 0.36 08/11/2016 0936       ASSESSMENT & PLAN:  Iron deficiency anemia The most likely cause of her anemia is due to chronic blood loss/malabsorption syndrome. We discussed some of the risks, benefits, and alternatives of  intravenous iron infusions. The patient is symptomatic from anemia and the iron level is critically low. She tolerated oral iron supplement poorly and desires to achieved higher levels of iron faster for adequate hematopoesis. Some of the side-effects to be expected including risks of infusion reactions, phlebitis, headaches, nausea and fatigue.  The patient is willing to proceed. Patient education material was dispensed.  Goal is to keep ferritin level greater than 50 and resolution of anemia She will get second dose of IV iron in 2 weeks and a plan to recheck her blood count again in December   Right leg DVT ([place]) The recent right lower extremity DVT could be provoked by mild dehydration and  possibly presence of lupus anticoagulant, although unproven. Even though I did not order the blood test to confirm this, the presence of strong evidence of rheumatoid factor and anti-CCP antibiotic in 2015 suggested that she may have lingering lupus anticoagulant, especially the fact that she is not treated. We discussed about the pros and cons about testing for thrombophilia disorder/lupus anticoagulant teating. her current anticoagulation therapy will interfere with some the tests and it is not possible to interpret the test results.  I do not see a reason to order excessive testing to screen for thrombophilia disorder as it would not change our management. With her significant obesity, recurrent dehydration and untreated rheumatoid arthritis made me very concerned about recurrent blood clots. Her risk of recurrence of blood clot if we discontinue anticoagulation therapy after 3 months could be as high as 20% or more. After prolonged discussion, we are in agreement for her to remain on anticoagulation indefinitely. First 3 months of anticoagulation therapy is to treat her recent blood clot and any pain beyond that is for secondary prevention. She needs minimum twice a year CBC and renal function study for close monitoring as Eliquis can be associated with 3% risk of bleeding and is cleared by the kidneys. Due to recent bleeding and borderline kidney function, I recommend reducing the dose of Eliquis to 2.5 mg twice a day, indefinitely Finally, at the end of our consultation today, I reinforced the importance of preventive strategies such as avoiding hormonal supplement, avoiding cigarette smoking, keeping up-to-date with screening programs for early cancer detection, frequent ambulation for long distance travel and aggressive DVT prophylaxis in all surgical settings. I told her that the benefit of chronic anticoagulation therapy will outweigh the risk of not being on anticoagulation therapy  RA (rheumatoid  arthritis) ([place]) She is not symptomatic is currently taking calcium and vitamin D only. I would defer to her primary care doctor and rheumatologist for further follow-up. Even though not proven, I suspect she may have lupus anticoagulant that may have precipitated recent diagnosis of DVT. With her poor mobility from rheumatoid arthritis, I felt that it is best to leave her on low-dose chronic anticoagulation therapy indefinitely   Orders Placed This Encounter  Procedures  . CBC with Differential/Platelet    Standing Status:   Standing    Number of Occurrences:   22    Standing Expiration Date:   03/10/2018  . Ferritin    Standing Status:   Standing    Number of Occurrences:   9    Standing Expiration Date:   03/10/2018  . Basic metabolic panel    Standing Status:   Standing    Number of Occurrences:   9    Standing Expiration Date:   03/10/2018    All questions were answered. The patient knows  to call the clinic with any problems, questions or concerns. No barriers to learning was detected.  I spent 15 minutes counseling the patient face to face. The total time spent in the appointment was 20 minutes and more than 50% was on counseling.     Heath Lark, MD 9/14/201810:14 AM

## 2017-03-22 ENCOUNTER — Inpatient Hospital Stay (HOSPITAL_COMMUNITY)
Admission: EM | Admit: 2017-03-22 | Discharge: 2017-03-30 | DRG: 871 | Disposition: A | Discharge status: Home Health | Payer: Medicare Other | Attending: Internal Medicine | Admitting: Internal Medicine

## 2017-03-22 ENCOUNTER — Emergency Department (HOSPITAL_COMMUNITY): Payer: Medicare Other

## 2017-03-22 ENCOUNTER — Encounter (HOSPITAL_COMMUNITY): Payer: Self-pay

## 2017-03-22 DIAGNOSIS — R12 Heartburn: Secondary | ICD-10-CM | POA: Diagnosis not present

## 2017-03-22 DIAGNOSIS — K831 Obstruction of bile duct: Secondary | ICD-10-CM | POA: Diagnosis present

## 2017-03-22 DIAGNOSIS — R7989 Other specified abnormal findings of blood chemistry: Secondary | ICD-10-CM

## 2017-03-22 DIAGNOSIS — D509 Iron deficiency anemia, unspecified: Secondary | ICD-10-CM | POA: Diagnosis present

## 2017-03-22 DIAGNOSIS — Z79899 Other long term (current) drug therapy: Secondary | ICD-10-CM

## 2017-03-22 DIAGNOSIS — N179 Acute kidney failure, unspecified: Secondary | ICD-10-CM | POA: Diagnosis present

## 2017-03-22 DIAGNOSIS — E876 Hypokalemia: Secondary | ICD-10-CM | POA: Diagnosis present

## 2017-03-22 DIAGNOSIS — D132 Benign neoplasm of duodenum: Secondary | ICD-10-CM

## 2017-03-22 DIAGNOSIS — M0579 Rheumatoid arthritis with rheumatoid factor of multiple sites without organ or systems involvement: Secondary | ICD-10-CM | POA: Diagnosis not present

## 2017-03-22 DIAGNOSIS — R1013 Epigastric pain: Secondary | ICD-10-CM

## 2017-03-22 DIAGNOSIS — I82401 Acute embolism and thrombosis of unspecified deep veins of right lower extremity: Secondary | ICD-10-CM | POA: Diagnosis present

## 2017-03-22 DIAGNOSIS — K449 Diaphragmatic hernia without obstruction or gangrene: Secondary | ICD-10-CM | POA: Diagnosis present

## 2017-03-22 DIAGNOSIS — I10 Essential (primary) hypertension: Secondary | ICD-10-CM | POA: Diagnosis present

## 2017-03-22 DIAGNOSIS — K317 Polyp of stomach and duodenum: Secondary | ICD-10-CM | POA: Diagnosis present

## 2017-03-22 DIAGNOSIS — J189 Pneumonia, unspecified organism: Secondary | ICD-10-CM | POA: Diagnosis not present

## 2017-03-22 DIAGNOSIS — Z7901 Long term (current) use of anticoagulants: Secondary | ICD-10-CM | POA: Diagnosis not present

## 2017-03-22 DIAGNOSIS — D696 Thrombocytopenia, unspecified: Secondary | ICD-10-CM | POA: Diagnosis not present

## 2017-03-22 DIAGNOSIS — K219 Gastro-esophageal reflux disease without esophagitis: Secondary | ICD-10-CM | POA: Diagnosis present

## 2017-03-22 DIAGNOSIS — Z86718 Personal history of other venous thrombosis and embolism: Secondary | ICD-10-CM | POA: Diagnosis not present

## 2017-03-22 DIAGNOSIS — I440 Atrioventricular block, first degree: Secondary | ICD-10-CM | POA: Diagnosis present

## 2017-03-22 DIAGNOSIS — K8050 Calculus of bile duct without cholangitis or cholecystitis without obstruction: Secondary | ICD-10-CM | POA: Diagnosis present

## 2017-03-22 DIAGNOSIS — R000 Tachycardia, unspecified: Secondary | ICD-10-CM | POA: Diagnosis present

## 2017-03-22 DIAGNOSIS — M79609 Pain in unspecified limb: Secondary | ICD-10-CM | POA: Diagnosis not present

## 2017-03-22 DIAGNOSIS — K8309 Other cholangitis: Secondary | ICD-10-CM | POA: Diagnosis present

## 2017-03-22 DIAGNOSIS — R6510 Systemic inflammatory response syndrome (SIRS) of non-infectious origin without acute organ dysfunction: Secondary | ICD-10-CM | POA: Diagnosis present

## 2017-03-22 DIAGNOSIS — I82501 Chronic embolism and thrombosis of unspecified deep veins of right lower extremity: Secondary | ICD-10-CM | POA: Diagnosis not present

## 2017-03-22 DIAGNOSIS — Z6841 Body Mass Index (BMI) 40.0 and over, adult: Secondary | ICD-10-CM

## 2017-03-22 DIAGNOSIS — M109 Gout, unspecified: Secondary | ICD-10-CM | POA: Diagnosis present

## 2017-03-22 DIAGNOSIS — A4159 Other Gram-negative sepsis: Secondary | ICD-10-CM | POA: Diagnosis not present

## 2017-03-22 DIAGNOSIS — Z23 Encounter for immunization: Secondary | ICD-10-CM | POA: Diagnosis present

## 2017-03-22 DIAGNOSIS — K830 Cholangitis: Secondary | ICD-10-CM | POA: Diagnosis not present

## 2017-03-22 DIAGNOSIS — I484 Atypical atrial flutter: Secondary | ICD-10-CM | POA: Diagnosis present

## 2017-03-22 DIAGNOSIS — K3189 Other diseases of stomach and duodenum: Secondary | ICD-10-CM | POA: Diagnosis not present

## 2017-03-22 DIAGNOSIS — Y95 Nosocomial condition: Secondary | ICD-10-CM | POA: Diagnosis not present

## 2017-03-22 DIAGNOSIS — K315 Obstruction of duodenum: Secondary | ICD-10-CM | POA: Diagnosis not present

## 2017-03-22 DIAGNOSIS — R0600 Dyspnea, unspecified: Secondary | ICD-10-CM

## 2017-03-22 DIAGNOSIS — I4892 Unspecified atrial flutter: Secondary | ICD-10-CM | POA: Diagnosis not present

## 2017-03-22 DIAGNOSIS — R933 Abnormal findings on diagnostic imaging of other parts of digestive tract: Secondary | ICD-10-CM

## 2017-03-22 DIAGNOSIS — R945 Abnormal results of liver function studies: Secondary | ICD-10-CM

## 2017-03-22 DIAGNOSIS — R112 Nausea with vomiting, unspecified: Secondary | ICD-10-CM

## 2017-03-22 DIAGNOSIS — B961 Klebsiella pneumoniae [K. pneumoniae] as the cause of diseases classified elsewhere: Secondary | ICD-10-CM | POA: Diagnosis present

## 2017-03-22 DIAGNOSIS — R1011 Right upper quadrant pain: Secondary | ICD-10-CM | POA: Diagnosis not present

## 2017-03-22 DIAGNOSIS — M069 Rheumatoid arthritis, unspecified: Secondary | ICD-10-CM | POA: Diagnosis present

## 2017-03-22 HISTORY — DX: Calculus of bile duct without cholangitis or cholecystitis without obstruction: K80.50

## 2017-03-22 HISTORY — DX: Systemic inflammatory response syndrome (sirs) of non-infectious origin without acute organ dysfunction: R65.10

## 2017-03-22 LAB — CBC WITH DIFFERENTIAL/PLATELET
BASOS PCT: 0 %
Basophils Absolute: 0 10*3/uL (ref 0.0–0.1)
Eosinophils Absolute: 0 10*3/uL (ref 0.0–0.7)
Eosinophils Relative: 0 %
HEMATOCRIT: 34.9 % — AB (ref 36.0–46.0)
HEMOGLOBIN: 10.9 g/dL — AB (ref 12.0–15.0)
LYMPHS PCT: 3 %
Lymphs Abs: 0.5 10*3/uL — ABNORMAL LOW (ref 0.7–4.0)
MCH: 25.7 pg — AB (ref 26.0–34.0)
MCHC: 31.2 g/dL (ref 30.0–36.0)
MCV: 82.3 fL (ref 78.0–100.0)
MONOS PCT: 1 %
Monocytes Absolute: 0.2 10*3/uL (ref 0.1–1.0)
NEUTROS ABS: 16.2 10*3/uL — AB (ref 1.7–7.7)
NEUTROS PCT: 96 %
Platelets: 262 10*3/uL (ref 150–400)
RBC: 4.24 MIL/uL (ref 3.87–5.11)
RDW: 18.1 % — ABNORMAL HIGH (ref 11.5–15.5)
WBC: 16.9 10*3/uL — ABNORMAL HIGH (ref 4.0–10.5)

## 2017-03-22 LAB — URINALYSIS, ROUTINE W REFLEX MICROSCOPIC
Bilirubin Urine: NEGATIVE
Glucose, UA: NEGATIVE mg/dL
Hgb urine dipstick: NEGATIVE
Ketones, ur: NEGATIVE mg/dL
Leukocytes, UA: NEGATIVE
Nitrite: NEGATIVE
Protein, ur: NEGATIVE mg/dL
Specific Gravity, Urine: 1.017 (ref 1.005–1.030)
pH: 5 (ref 5.0–8.0)

## 2017-03-22 LAB — COMPREHENSIVE METABOLIC PANEL
ALT: 215 U/L — ABNORMAL HIGH (ref 14–54)
AST: 396 U/L — ABNORMAL HIGH (ref 15–41)
Albumin: 3.5 g/dL (ref 3.5–5.0)
Alkaline Phosphatase: 318 U/L — ABNORMAL HIGH (ref 38–126)
Anion gap: 12 (ref 5–15)
BUN: 28 mg/dL — ABNORMAL HIGH (ref 6–20)
CO2: 26 mmol/L (ref 22–32)
Calcium: 9.3 mg/dL (ref 8.9–10.3)
Chloride: 101 mmol/L (ref 101–111)
Creatinine, Ser: 1.39 mg/dL — ABNORMAL HIGH (ref 0.44–1.00)
GFR calc Af Amer: 43 mL/min — ABNORMAL LOW (ref >60)
GFR calc non Af Amer: 37 mL/min — ABNORMAL LOW (ref >60)
Glucose, Bld: 153 mg/dL — ABNORMAL HIGH (ref 65–99)
Potassium: 3 mmol/L — ABNORMAL LOW (ref 3.5–5.1)
Sodium: 139 mmol/L (ref 135–145)
Total Bilirubin: 1.8 mg/dL — ABNORMAL HIGH (ref 0.3–1.2)
Total Protein: 7.3 g/dL (ref 6.5–8.1)

## 2017-03-22 LAB — LIPASE, BLOOD: Lipase: 37 U/L (ref 11–51)

## 2017-03-22 MED ORDER — IOPAMIDOL (ISOVUE-300) INJECTION 61%
80.0000 mL | Freq: Once | INTRAVENOUS | Status: AC | PRN
  Administered 2017-03-22: 80 mL via INTRAVENOUS

## 2017-03-22 MED ORDER — SODIUM CHLORIDE 0.9 % IV SOLN
INTRAVENOUS | Status: AC
  Administered 2017-03-23 (×3): via INTRAVENOUS

## 2017-03-22 MED ORDER — IOPAMIDOL (ISOVUE-300) INJECTION 61%
INTRAVENOUS | Status: AC
  Administered 2017-03-22: 30 mL via ORAL
  Filled 2017-03-22: qty 30

## 2017-03-22 MED ORDER — GI COCKTAIL ~~LOC~~
30.0000 mL | Freq: Once | ORAL | Status: AC
  Administered 2017-03-22: 30 mL via ORAL
  Filled 2017-03-22: qty 30

## 2017-03-22 MED ORDER — ONDANSETRON HCL 4 MG/2ML IJ SOLN
4.0000 mg | Freq: Once | INTRAMUSCULAR | Status: AC
  Administered 2017-03-22: 4 mg via INTRAVENOUS
  Filled 2017-03-22: qty 2

## 2017-03-22 MED ORDER — PIPERACILLIN-TAZOBACTAM 3.375 G IVPB 30 MIN
3.3750 g | Freq: Once | INTRAVENOUS | Status: AC
  Administered 2017-03-23: 3.375 g via INTRAVENOUS
  Filled 2017-03-22: qty 50

## 2017-03-22 MED ORDER — ONDANSETRON HCL 4 MG/2ML IJ SOLN: 4.0000 mg | Freq: Once | INTRAMUSCULAR | Status: DC | PRN

## 2017-03-22 MED ORDER — ALBUTEROL SULFATE (2.5 MG/3ML) 0.083% IN NEBU
2.5000 mg | INHALATION_SOLUTION | Freq: Four times a day (QID) | RESPIRATORY_TRACT | Status: DC | PRN
  Administered 2017-03-25: 2.5 mg via RESPIRATORY_TRACT
  Filled 2017-03-22: qty 3

## 2017-03-22 MED ORDER — FAMOTIDINE IN NACL 20-0.9 MG/50ML-% IV SOLN
20.0000 mg | Freq: Once | INTRAVENOUS | Status: AC
  Administered 2017-03-22: 20 mg via INTRAVENOUS
  Filled 2017-03-22: qty 50

## 2017-03-22 MED ORDER — IOPAMIDOL (ISOVUE-300) INJECTION 61%
INTRAVENOUS | Status: AC
  Filled 2017-03-22: qty 100

## 2017-03-22 MED ORDER — ACETAMINOPHEN 325 MG PO TABS
650.0000 mg | ORAL_TABLET | Freq: Four times a day (QID) | ORAL | Status: DC | PRN
  Administered 2017-03-28 – 2017-03-29 (×4): 650 mg via ORAL
  Filled 2017-03-22 (×5): qty 2

## 2017-03-22 MED ORDER — IOPAMIDOL (ISOVUE-300) INJECTION 61%
30.0000 mL | Freq: Once | INTRAVENOUS | Status: AC | PRN
  Administered 2017-03-22: 30 mL via ORAL

## 2017-03-22 MED ORDER — SODIUM CHLORIDE 0.9 % IV BOLUS (SEPSIS)
1000.0000 mL | Freq: Once | INTRAVENOUS | Status: AC
  Administered 2017-03-22: 1000 mL via INTRAVENOUS

## 2017-03-22 MED ORDER — ONDANSETRON HCL 4 MG/2ML IJ SOLN
4.0000 mg | Freq: Four times a day (QID) | INTRAMUSCULAR | Status: DC | PRN
  Administered 2017-03-24 – 2017-03-27 (×6): 4 mg via INTRAVENOUS
  Filled 2017-03-22 (×5): qty 2

## 2017-03-22 MED ORDER — ACETAMINOPHEN 650 MG RE SUPP: 650.0000 mg | Freq: Four times a day (QID) | RECTAL | Status: DC | PRN

## 2017-03-22 MED ORDER — ALBUTEROL SULFATE HFA 108 (90 BASE) MCG/ACT IN AERS: 2.0000 | INHALATION_SPRAY | Freq: Four times a day (QID) | RESPIRATORY_TRACT | Status: DC | PRN

## 2017-03-22 MED ORDER — HYDROMORPHONE HCL 1 MG/ML IJ SOLN
0.5000 mg | Freq: Once | INTRAMUSCULAR | Status: AC
  Administered 2017-03-22: 0.5 mg via INTRAVENOUS
  Filled 2017-03-22: qty 1

## 2017-03-22 MED ORDER — ONDANSETRON HCL 4 MG PO TABS
4.0000 mg | ORAL_TABLET | Freq: Four times a day (QID) | ORAL | Status: DC | PRN
  Administered 2017-03-23: 4 mg via ORAL
  Filled 2017-03-22: qty 1

## 2017-03-22 NOTE — ED Triage Notes (Addendum)
Patient BIB EMS from home. Patient c/o abd pain starting at 0900. Patient has history of abdominal pain and "milk usually helps it." Patient reports milk did not help the pain this time. Patient reports taking "half a yellow pain pill that belongs to my daughter, I dont know the name of it" at 1230. Patient vomited once for EMS with clear output. Patient reports 10/10 pain in triage. CBG for EMS = 101.

## 2017-03-22 NOTE — H&P (Addendum)
History and Physical    Peggy Armstrong OEV:[id number] DOB: 1945/11/21 DOA: 03/22/2017  PCP: Thurman Coyer, MD  Patient coming from: Home.  Chief Complaint: Abdominal pain.  HPI: Peggy Armstrong is a 71 y.o. female with history of DVT, rheumatoid arthritis, hypertension presents to the ER with complaints of epigastric pain since today morning. Has had some nausea vomiting. Denies any diarrhea. Pain is mostly in the epigastric area radiating to the right upper quadrant. Patient otherwise denies any chest pain or shortness of breath.   ED Course: In the ER patient's labs show elevated liver function tests. CT of the abdomen and pelvis done shows mild intra-and extrahepatic biliary ductal dilation. On-call gastroenterologist Dr. Ardis Hughs was consulted. Dr. Ardis Hughs requested MRCP. Patient blood pressures in the low normal concerning for developing sepsis. Blood pressure improved with fluids.  Review of Systems: As per HPI, rest all negative.   Past Medical History:  Diagnosis Date  . Clotting disorder ([place])    right DVT  . Gout   . Gout   . Hypertension   . RA (rheumatoid arthritis) ([place])     Past Surgical History:  Procedure Laterality Date  . ABDOMINAL HYSTERECTOMY    . CHOLECYSTECTOMY    . HERNIA REPAIR       reports that she has never smoked. She has never used smokeless tobacco. She reports that she does not drink alcohol or use drugs.  Allergies  Allergen Reactions  . Codeine Nausea And Vomiting    Made "very sick"    Family History  Problem Relation Age of Onset  . Cancer Father        prostate ca    Prior to Admission medications   Medication Sig Start Date End Date Taking? Authorizing Provider  albuterol (PROVENTIL HFA;VENTOLIN HFA) 108 (90 Base) MCG/ACT inhaler Inhale 2 puffs into the lungs every 6 (six) hours as needed for wheezing or shortness of breath.  10/28/16 10/28/17 Yes [provider]  allopurinol (ZYLOPRIM) 300 MG tablet Take 300 mg by  mouth daily.  05/06/15  Yes [provider]  ALPRAZolam (XANAX) 0.25 MG tablet Take 0.25 mg by mouth at bedtime.  07/21/15  Yes [provider]  apixaban (ELIQUIS) 2.5 MG TABS tablet Take 2.5 mg by mouth 2 (two) times daily.   Yes [provider]  atorvastatin (LIPITOR) 10 MG tablet Take 10 mg by mouth daily. 07/12/16  Yes [provider]  chlorthalidone (HYGROTON) 25 MG tablet Take 25 mg by mouth daily. 07/22/15  Yes [provider]  cholecalciferol (VITAMIN D) 1000 units tablet Take 1,000 Units by mouth daily.   Yes [provider]  cloNIDine (CATAPRES) 0.2 MG tablet Take 0.2 mg by mouth 2 (two) times daily. 07/22/15  Yes [provider]  diltiazem (CARDIZEM CD) 300 MG 24 hr capsule Take 300 mg by mouth daily. 07/22/15  Yes [provider]  lisinopril (PRINIVIL,ZESTRIL) 10 MG tablet Take 10 mg by mouth daily. 07/22/15  Yes [provider]  vitamin B-12 (CYANOCOBALAMIN) 1000 MCG tablet Take 1,000 mcg by mouth daily.   Yes [provider]    Physical Exam: Vitals:   03/22/17 2000 03/22/17 2100 03/22/17 2200 03/22/17 2300  BP: (!) 118/45 (!) 102/54 (!) 118/58 99/69  Pulse: 74 88 95 92  Resp: (!) 25 (!) 26 (!) 23 (!) 36  Temp:      TempSrc:      SpO2: (!) 87% 90% (!) 89% 90%  Constitutional: Moderately built and nourished. Vitals:   03/22/17 2000 03/22/17 2100 03/22/17 2200 03/22/17 2300  BP: (!) 118/45 (!) 102/54 (!) 118/58 99/69  Pulse: 74 88 95 92  Resp: (!) 25 (!) 26 (!) 23 (!) 36  Temp:      TempSrc:      SpO2: (!) 87% 90% (!) 89% 90%   Eyes: Anicteric no pallor. ENMT: No discharge from the ears eyes nose or mouth. Neck: No mass felt. No JVD appreciated. Respiratory: No rhonchi or crepitations. Cardiovascular: S1-S2 regular no murmurs appreciated. Abdomen: Soft nontender bowel sounds present. Musculoskeletal: No edema. No joint effusion. Skin: No rash. Skin appears warm. Neurologic:  Alert awake oriented to time place and person. Moves all extremities. Psychiatric: Appears normal. Normal affect.   Labs on Admission: I have personally reviewed following labs and imaging studies  CBC:  Recent Labs Lab 03/22/17 1712  WBC 16.9*  NEUTROABS 16.2*  HGB 10.9*  HCT 34.9*  MCV 82.3  PLT 810   Basic Metabolic Panel:  Recent Labs Lab 03/22/17 1530  NA 139  K 3.0*  CL 101  CO2 26  GLUCOSE 153*  BUN 28*  CREATININE 1.39*  CALCIUM 9.3   GFR: Estimated Creatinine Clearance: 42.5 mL/min (A) (by C-G formula based on SCr of 1.39 mg/dL (H)). Liver Function Tests:  Recent Labs Lab 03/22/17 1530  AST 396*  ALT 215*  ALKPHOS 318*  BILITOT 1.8*  PROT 7.3  ALBUMIN 3.5    Recent Labs Lab 03/22/17 1530  LIPASE 37   No results for input(s): AMMONIA in the last 168 hours. Coagulation Profile: No results for input(s): INR, PROTIME in the last 168 hours. Cardiac Enzymes: No results for input(s): CKTOTAL, CKMB, CKMBINDEX, TROPONINI in the last 168 hours. BNP (last 3 results) No results for input(s): PROBNP in the last 8760 hours. HbA1C: No results for input(s): HGBA1C in the last 72 hours. CBG: No results for input(s): GLUCAP in the last 168 hours. Lipid Profile: No results for input(s): CHOL, HDL, LDLCALC, TRIG, CHOLHDL, LDLDIRECT in the last 72 hours. Thyroid Function Tests: No results for input(s): TSH, T4TOTAL, FREET4, T3FREE, THYROIDAB in the last 72 hours. Anemia Panel: No results for input(s): VITAMINB12, FOLATE, FERRITIN, TIBC, IRON, RETICCTPCT in the last 72 hours. Urine analysis:    Component Value Date/Time   COLORURINE AMBER (A) 03/22/2017 1530   APPEARANCEUR HAZY (A) 03/22/2017 1530   LABSPEC 1.017 03/22/2017 1530   PHURINE 5.0 03/22/2017 1530   GLUCOSEU NEGATIVE 03/22/2017 1530   HGBUR NEGATIVE 03/22/2017 1530   BILIRUBINUR NEGATIVE 03/22/2017 1530   KETONESUR NEGATIVE 03/22/2017 1530   PROTEINUR NEGATIVE 03/22/2017 1530   NITRITE  NEGATIVE 03/22/2017 1530   LEUKOCYTESUR NEGATIVE 03/22/2017 1530   Sepsis Labs: @LABRCNTIP (procalcitonin:4,lacticidven:4) )No results found for this or any previous visit (from the past 240 hour(s)).   Radiological Exams on Admission: Ct Abdomen Pelvis W Contrast  Result Date: 03/22/2017 CLINICAL DATA:  Mid abdominal pain and right upper quadrant pain with nausea and vomiting EXAM: CT ABDOMEN AND PELVIS WITH CONTRAST TECHNIQUE: Multidetector CT imaging of the abdomen and pelvis was performed using the standard protocol following bolus administration of intravenous contrast. CONTRAST:  27mL ISOVUE-300 IOPAMIDOL (ISOVUE-300) INJECTION 61% COMPARISON:  None. FINDINGS: Lower chest: Lung bases demonstrate linear scarring. No acute consolidation or pleural effusion. Normal heart size. Hepatobiliary: Mild intra hepatic and moderate extrahepatic biliary dilatation with common duct measuring up to 18 mm. Surgical absence of the gallbladder. Pancreas: Unremarkable. No pancreatic ductal dilatation  or surrounding inflammatory changes. Spleen: Normal in size without focal abnormality. Adrenals/Urinary Tract: Adrenal glands are within normal limits. Small cyst in the left kidney. No hydronephrosis. The bladder is normal Stomach/Bowel: The stomach is nonenlarged. No dilated small bowel. No colon wall thickening. Sigmoid colon diverticular disease without acute inflammation Vascular/Lymphatic: Aortic atherosclerosis. No enlarged abdominal or pelvic lymph nodes. Reproductive: Status post hysterectomy. No adnexal masses. Other: Negative for free air or free fluid. Musculoskeletal: Degenerative changes. 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 IMPRESSION: 1. Mild intra hepatic and moderate extrahepatic biliary dilatation, post cholecystectomy. Recommend correlation with laboratory values, follow-up MR or ERCP as indicated 2. Sigmoid colon diverticular disease without acute inflammation. Electronically Signed   By: Donavan Foil M.D.   On: 03/22/2017 21:48     Assessment/Plan Principal Problem:   SIRS (systemic inflammatory response syndrome) ([facility]) Active Problems:   Right leg DVT ([facility])   RA (rheumatoid arthritis) ([facility])   Iron deficiency anemia   Choledocholithiasis    1. SIRS/developing sepsis likely from intra-abdominal cause likely biliary in origin concerning for cholangitis - patient has been placed on Zosyn. Follow blood cultures. Will keep patient nothing by mouth in anticipation of possible GI procedure. Follow MRCP. 2. History of DVT on Apixaban - patient had taken Apixaban this morning. Will await MRCP results and if no procedure planned then we will start patient on heparin while inpatient as soon as possible. 3. History of hypertension presently in the low-normal blood pressure holding off antihypertensive since patient looks like being septic. Will keep patient on when necessary IV hydralazine. 4. Chronic kidney disease stage III - creatinine appears to be at baseline. 5. History of rheumatoid arthritis presently not on any medications. 6. History of I deficiency anemia. Patient is to receive iron infusion later in the week.  I have reviewed patient's old charts and labs.   DVT prophylaxis: Has taken Apixaban this morning. Will start heparin if no procedure anticipated after MRCP. Avoiding SCDs due to history of DVT. Code Status: Full code.  Family Communication: Daughter.  Disposition Plan: Home.  Consults called: Gastroenterology.  Admission status: Inpatient patient.    Rise Patience MD [facility] Pager [phone].  If 7PM-7AM, please contact night-coverage [email or web address] Password [email or web address]  03/22/2017, 11:40 PM

## 2017-03-22 NOTE — ED Provider Notes (Addendum)
Medical screening examination/treatment/procedure(s) were conducted as a shared visit with non-physician practitioner(s) and myself.  I personally evaluated the patient during the encounter.   EKG Interpretation None      71yF with abdominal pain. Epigastric but tenderness seems more periumbilical on exam. Protuberant abdomen, doesn't necessarily seems distended. N/v. S/o chole. She seems pretty uncomfortable. No improvement with GI cocktail. Pain meds. Needs imaging. Complained several times of being cold while I was in the room. She is afebrile though.    CT with intra and extrahepatic dilatation. LFTs elevated. Leukocytosis. Likely choledocholithiasis. Abx. BP a little soft. Additional IVF. Lactic acid. Blood cultures. GI consultation. Admission.   Ct Abdomen Pelvis W Contrast  Result Date: 03/22/2017 CLINICAL DATA:  Mid abdominal pain and right upper quadrant pain with nausea and vomiting EXAM: CT ABDOMEN AND PELVIS WITH CONTRAST TECHNIQUE: Multidetector CT imaging of the abdomen and pelvis was performed using the standard protocol following bolus administration of intravenous contrast. CONTRAST:  105mL ISOVUE-300 IOPAMIDOL (ISOVUE-300) INJECTION 61% COMPARISON:  None. FINDINGS: Lower chest: Lung bases demonstrate linear scarring. No acute consolidation or pleural effusion. Normal heart size. Hepatobiliary: Mild intra hepatic and moderate extrahepatic biliary dilatation with common duct measuring up to 18 mm. Surgical absence of the gallbladder. Pancreas: Unremarkable. No pancreatic ductal dilatation or surrounding inflammatory changes. Spleen: Normal in size without focal abnormality. Adrenals/Urinary Tract: Adrenal glands are within normal limits. Small cyst in the left kidney. No hydronephrosis. The bladder is normal Stomach/Bowel: The stomach is nonenlarged. No dilated small bowel. No colon wall thickening. Sigmoid colon diverticular disease without acute inflammation Vascular/Lymphatic: Aortic  atherosclerosis. No enlarged abdominal or pelvic lymph nodes. Reproductive: Status post hysterectomy. No adnexal masses. Other: Negative for free air or free fluid. Musculoskeletal: Degenerative changes. 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 IMPRESSION: 1. Mild intra hepatic and moderate extrahepatic biliary dilatation, post cholecystectomy. Recommend correlation with laboratory values, follow-up MR or ERCP as indicated 2. Sigmoid colon diverticular disease without acute inflammation. Electronically Signed   By: Donavan Foil M.D.   On: 03/22/2017 21:48     Virgel Manifold, MD 03/22/17 2216

## 2017-03-22 NOTE — ED Notes (Signed)
Hospital phlebotomy called for patient's lab draw. EDPA aware.

## 2017-03-22 NOTE — ED Notes (Signed)
Attempted to call report to floor [phone] unable to take at this time

## 2017-03-22 NOTE — ED Provider Notes (Signed)
[place] DEPT Provider Note   CSN: [id number] Arrival date & time: 03/22/17  1503     History   Chief Complaint Chief Complaint  Patient presents with  . Abdominal Pain    HPI Peggy Armstrong is a 71 y.o. female with history of upper abdominal pain who presents with epigastric and RUQ abdominal pain and vomiting. She reports no usually helps her pain, however that did not help this time. She also took hydrocodone 10 mg without significant relief of her pain. Patient denies any urinary symptoms or diarrhea. She also denies any bloody stools. She denies fever, chest pain, shortness of breath. She is status post cholecystectomy.   Abdominal Pain   Associated symptoms include nausea and vomiting. Pertinent negatives include fever, diarrhea, dysuria and headaches.    Past Medical History:  Diagnosis Date  . Clotting disorder ([place])    right DVT  . Gout   . Gout   . Hypertension   . RA (rheumatoid arthritis) [facility])     Patient Active Problem List   Diagnosis Date Noted  . Choledocholithiasis 03/22/2017  . SIRS (systemic inflammatory response syndrome) ([place]) 03/22/2017  . Poor dentition 08/14/2016  . Iron deficiency anemia 08/02/2016  . 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 RA (rheumatoid arthritis) ([place]) 09/07/2015    Past Surgical History:  Procedure Laterality Date  . ABDOMINAL HYSTERECTOMY    . CHOLECYSTECTOMY    . HERNIA REPAIR      OB History    No data available       Home Medications    Prior to Admission medications   Medication Sig Start Date End Date Taking? Authorizing Provider  albuterol (PROVENTIL HFA;VENTOLIN HFA) 108 (90 Base) MCG/ACT inhaler Inhale 2 puffs into the lungs every 6 (six) hours as needed for wheezing or shortness of breath.  10/28/16 10/28/17 Yes [provider]  allopurinol (ZYLOPRIM) 300 MG tablet Take 300 mg by mouth daily.  05/06/15  Yes [provider]  ALPRAZolam (XANAX) 0.25 MG tablet Take 0.25 mg by mouth at  bedtime.  07/21/15  Yes [provider]  apixaban (ELIQUIS) 2.5 MG TABS tablet Take 2.5 mg by mouth 2 (two) times daily.   Yes [provider]  atorvastatin (LIPITOR) 10 MG tablet Take 10 mg by mouth daily. 07/12/16  Yes [provider]  chlorthalidone (HYGROTON) 25 MG tablet Take 25 mg by mouth daily. 07/22/15  Yes [provider]  cholecalciferol (VITAMIN D) 1000 units tablet Take 1,000 Units by mouth daily.   Yes [provider]  cloNIDine (CATAPRES) 0.2 MG tablet Take 0.2 mg by mouth 2 (two) times daily. 07/22/15  Yes [provider]  diltiazem (CARDIZEM CD) 300 MG 24 hr capsule Take 300 mg by mouth daily. 07/22/15  Yes [provider]  lisinopril (PRINIVIL,ZESTRIL) 10 MG tablet Take 10 mg by mouth daily. 07/22/15  Yes [provider]  vitamin B-12 (CYANOCOBALAMIN) 1000 MCG tablet Take 1,000 mcg by mouth daily.   Yes [provider]    Family History Family History  Problem Relation Age of Onset  . Cancer Father        prostate ca    Social History Social History  Substance Use Topics  . Smoking status: Never Smoker  . Smokeless tobacco: Never Used  . Alcohol use No     Allergies   Codeine   Review of Systems Review of Systems  Constitutional: Negative for chills and fever.  HENT: Negative for facial swelling  and sore throat.   Respiratory: Negative for shortness of breath.   Cardiovascular: Negative for chest pain.  Gastrointestinal: Positive for abdominal pain, nausea and vomiting. Negative for blood in stool and diarrhea.  Genitourinary: Negative for dysuria.  Musculoskeletal: Negative for back pain.  Skin: Negative for rash and wound.  Neurological: Negative for headaches.  Psychiatric/Behavioral: The patient is not nervous/anxious.      Physical Exam Updated Vital Signs BP 99/69   Pulse 92   Temp 99.1 F (37.3 C) (Oral)   Resp (!) 36   SpO2 90%   Physical Exam  Constitutional:  She appears well-developed and well-nourished. No distress.  HENT:  Head: Normocephalic and atraumatic.  Mouth/Throat: Oropharynx is clear and moist. No oropharyngeal exudate.  Eyes: Pupils are equal, round, and reactive to light. Conjunctivae are normal. Right eye exhibits no discharge. Left eye exhibits no discharge. No scleral icterus.  Neck: Normal range of motion. Neck supple. No thyromegaly present.  Cardiovascular: Normal rate, regular rhythm, normal heart sounds and intact distal pulses.  Exam reveals no gallop and no friction rub.   No murmur heard. Pulmonary/Chest: Effort normal and breath sounds normal. No stridor. No respiratory distress. She has no wheezes. She has no rales.  Abdominal: Soft. Bowel sounds are normal. She exhibits no distension. There is tenderness in the right upper quadrant and epigastric area. There is no rebound and no guarding.  Musculoskeletal: She exhibits no edema.  Lymphadenopathy:    She has no cervical adenopathy.  Neurological: She is alert. Coordination normal.  Skin: Skin is warm and dry. No rash noted. She is not diaphoretic. No pallor.  Psychiatric: She has a normal mood and affect.  Nursing note and vitals reviewed.    ED Treatments / Results  Labs (all labs ordered are listed, but only abnormal results are displayed) Labs Reviewed  COMPREHENSIVE METABOLIC PANEL - Abnormal; Notable for the following:       Result Value   Potassium 3.0 (*)    Glucose, Bld 153 (*)    BUN 28 (*)    Creatinine, Ser 1.39 (*)    AST 396 (*)    ALT 215 (*)    Alkaline Phosphatase 318 (*)    Total Bilirubin 1.8 (*)    GFR calc non Af Amer 37 (*)    GFR calc Af Amer 43 (*)    All other components within normal limits  URINALYSIS, ROUTINE W REFLEX MICROSCOPIC - Abnormal; Notable for the following:    Color, Urine AMBER (*)    APPearance HAZY (*)    All other components within normal limits  CBC WITH DIFFERENTIAL/PLATELET - Abnormal; Notable for the  following:    WBC 16.9 (*)    Hemoglobin 10.9 (*)    HCT 34.9 (*)    MCH 25.7 (*)    RDW 18.1 (*)    Neutro Abs 16.2 (*)    Lymphs Abs 0.5 (*)    All other components within normal limits  CBG MONITORING, ED - Abnormal; Notable for the following:    Glucose-Capillary 107 (*)    All other components within normal limits  CULTURE, BLOOD (ROUTINE X 2)  CULTURE, BLOOD (ROUTINE X 2)  LIPASE, BLOOD  CBC WITH DIFFERENTIAL/PLATELET  CBC WITH DIFFERENTIAL/PLATELET  LACTIC ACID, PLASMA  LACTIC ACID, PLASMA  PROCALCITONIN  PROTIME-INR  APTT  BASIC METABOLIC PANEL  HEPATIC FUNCTION PANEL  TYPE AND SCREEN    EKG  EKG Interpretation None  Radiology Ct Abdomen Pelvis W Contrast  Result Date: 03/22/2017 CLINICAL DATA:  Mid abdominal pain and right upper quadrant pain with nausea and vomiting EXAM: CT ABDOMEN AND PELVIS WITH CONTRAST TECHNIQUE: Multidetector CT imaging of the abdomen and pelvis was performed using the standard protocol following bolus administration of intravenous contrast. CONTRAST:  66mL ISOVUE-300 IOPAMIDOL (ISOVUE-300) INJECTION 61% COMPARISON:  None. FINDINGS: Lower chest: Lung bases demonstrate linear scarring. No acute consolidation or pleural effusion. Normal heart size. Hepatobiliary: Mild intra hepatic and moderate extrahepatic biliary dilatation with common duct measuring up to 18 mm. Surgical absence of the gallbladder. Pancreas: Unremarkable. No pancreatic ductal dilatation or surrounding inflammatory changes. Spleen: Normal in size without focal abnormality. Adrenals/Urinary Tract: Adrenal glands are within normal limits. Small cyst in the left kidney. No hydronephrosis. The bladder is normal Stomach/Bowel: The stomach is nonenlarged. No dilated small bowel. No colon wall thickening. Sigmoid colon diverticular disease without acute inflammation Vascular/Lymphatic: Aortic atherosclerosis. No enlarged abdominal or pelvic lymph nodes. Reproductive: Status post  hysterectomy. No adnexal masses. Other: Negative for free air or free fluid. Musculoskeletal: Degenerative changes. 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 IMPRESSION: 1. Mild intra hepatic and moderate extrahepatic biliary dilatation, post cholecystectomy. Recommend correlation with laboratory values, follow-up MR or ERCP as indicated 2. Sigmoid colon diverticular disease without acute inflammation. Electronically Signed   By: Donavan Foil M.D.   On: 03/22/2017 21:48    Procedures Procedures (including critical care time)  Medications Ordered in ED Medications  iopamidol (ISOVUE-300) 61 % injection (not administered)  piperacillin-tazobactam (ZOSYN) IVPB 3.375 g (not administered)  acetaminophen (TYLENOL) tablet 650 mg (not administered)    Or  acetaminophen (TYLENOL) suppository 650 mg (not administered)  ondansetron (ZOFRAN) tablet 4 mg (not administered)    Or  ondansetron (ZOFRAN) injection 4 mg (not administered)  0.9 %  sodium chloride infusion (not administered)  albuterol (PROVENTIL) (2.5 MG/3ML) 0.083% nebulizer solution 2.5 mg (not administered)  sodium chloride 0.9 % bolus 1,000 mL (0 mLs Intravenous Stopped 03/22/17 2147)  ondansetron (ZOFRAN) injection 4 mg (4 mg Intravenous Given 03/22/17 1620)  famotidine (PEPCID) IVPB 20 mg premix (0 mg Intravenous Stopped 03/22/17 1654)  gi cocktail (Maalox,Lidocaine,Donnatal) (30 mLs Oral Given 03/22/17 1620)  HYDROmorphone (DILAUDID) injection 0.5 mg (0.5 mg Intravenous Given 03/22/17 1737)  iopamidol (ISOVUE-300) 61 % injection 30 mL (30 mLs Oral Contrast Given 03/22/17 1733)  iopamidol (ISOVUE-300) 61 % injection 80 mL (80 mLs Intravenous Contrast Given 03/22/17 2111)  sodium chloride 0.9 % bolus 1,000 mL (1,000 mLs Intravenous New Bag/Given 03/22/17 2313)     Initial Impression / Assessment and Plan / ED Course  I have reviewed the triage vital signs and the nursing notes.  Pertinent labs & imaging results that were available during  my care of the patient were reviewed by me and considered in my medical decision making (see chart for details).     Patient with symptoms and findings concerning for choledocholithiasis. LFTs elevated in intrahepatic and extrahepatic dilatation found on CT scan. WBC 16.9. Pain and nausea controlled with GI cocktail, Pepcid, Zofran, Dilaudid. I discussed patient care with gastroenterologist, Dr. Ardis Hughs, who advised hospital admission and will evaluated in the morning for probable MRCP. Zosyn initiated. Blood cultures and Lactic acid pending, as blood pressures becoming softer in the ED. Fluids initiated. I consulted [facility], Dr Hal Hope, who will admit the patient for further evaluation and treatment. Patient also evaluated by Dr. Wilson Singer who guided the patient's management and agrees with plan.  Final Clinical Impressions(s) / ED Diagnoses   Final diagnoses:  Epigastric pain  Non-intractable vomiting with nausea, unspecified vomiting type  Elevated LFTs    New Prescriptions New Prescriptions   No medications on file     Caryl Ada 03/23/17 Karmen Bongo, MD 03/24/17 1446

## 2017-03-23 ENCOUNTER — Inpatient Hospital Stay (HOSPITAL_COMMUNITY): Payer: Medicare Other

## 2017-03-23 DIAGNOSIS — R112 Nausea with vomiting, unspecified: Secondary | ICD-10-CM

## 2017-03-23 DIAGNOSIS — K8309 Other cholangitis: Secondary | ICD-10-CM

## 2017-03-23 DIAGNOSIS — R7989 Other specified abnormal findings of blood chemistry: Secondary | ICD-10-CM

## 2017-03-23 DIAGNOSIS — R1013 Epigastric pain: Secondary | ICD-10-CM

## 2017-03-23 LAB — PROTIME-INR
INR: 1.52
PROTHROMBIN TIME: 18.2 s — AB (ref 11.4–15.2)

## 2017-03-23 LAB — CBC WITH DIFFERENTIAL/PLATELET
BASOS ABS: 0 10*3/uL (ref 0.0–0.1)
Basophils Relative: 0 %
Eosinophils Absolute: 0 10*3/uL (ref 0.0–0.7)
Eosinophils Relative: 0 %
HEMATOCRIT: 32.5 % — AB (ref 36.0–46.0)
HEMOGLOBIN: 10.2 g/dL — AB (ref 12.0–15.0)
LYMPHS PCT: 3 %
Lymphs Abs: 0.8 10*3/uL (ref 0.7–4.0)
MCH: 26.1 pg (ref 26.0–34.0)
MCHC: 31.4 g/dL (ref 30.0–36.0)
MCV: 83.1 fL (ref 78.0–100.0)
MONO ABS: 1.3 10*3/uL — AB (ref 0.1–1.0)
Monocytes Relative: 5 %
NEUTROS ABS: 22.1 10*3/uL — AB (ref 1.7–7.7)
Neutrophils Relative %: 91 %
Platelets: 210 10*3/uL (ref 150–400)
RBC: 3.91 MIL/uL (ref 3.87–5.11)
RDW: 18.3 % — ABNORMAL HIGH (ref 11.5–15.5)
WBC: 24.2 10*3/uL — AB (ref 4.0–10.5)

## 2017-03-23 LAB — BASIC METABOLIC PANEL
Anion gap: 12 (ref 5–15)
BUN: 24 mg/dL — AB (ref 6–20)
CALCIUM: 8.6 mg/dL — AB (ref 8.9–10.3)
CO2: 24 mmol/L (ref 22–32)
Chloride: 102 mmol/L (ref 101–111)
Creatinine, Ser: 1.48 mg/dL — ABNORMAL HIGH (ref 0.44–1.00)
GFR calc Af Amer: 40 mL/min — ABNORMAL LOW (ref >60)
GFR, EST NON AFRICAN AMERICAN: 34 mL/min — AB (ref >60)
GLUCOSE: 118 mg/dL — AB (ref 65–99)
Potassium: 3.3 mmol/L — ABNORMAL LOW (ref 3.5–5.1)
Sodium: 138 mmol/L (ref 135–145)

## 2017-03-23 LAB — BLOOD CULTURE ID PANEL (REFLEXED)

## 2017-03-23 LAB — HEPATIC FUNCTION PANEL
ALK PHOS: 302 U/L — AB (ref 38–126)
ALT: 232 U/L — AB (ref 14–54)
AST: 318 U/L — AB (ref 15–41)
Albumin: 3.1 g/dL — ABNORMAL LOW (ref 3.5–5.0)
BILIRUBIN DIRECT: 2 mg/dL — AB (ref 0.1–0.5)
BILIRUBIN INDIRECT: 1.2 mg/dL — AB (ref 0.3–0.9)
Total Bilirubin: 3.2 mg/dL — ABNORMAL HIGH (ref 0.3–1.2)
Total Protein: 6.4 g/dL — ABNORMAL LOW (ref 6.5–8.1)

## 2017-03-23 LAB — CBG MONITORING, ED: Glucose-Capillary: 107 mg/dL — ABNORMAL HIGH (ref 65–99)

## 2017-03-23 LAB — TYPE AND SCREEN
ABO/RH(D): O POS
Antibody Screen: NEGATIVE

## 2017-03-23 LAB — GLUCOSE, CAPILLARY
GLUCOSE-CAPILLARY: 99 mg/dL (ref 65–99)
Glucose-Capillary: 116 mg/dL — ABNORMAL HIGH (ref 65–99)
Glucose-Capillary: 88 mg/dL (ref 65–99)

## 2017-03-23 LAB — ABO/RH: ABO/RH(D): O POS

## 2017-03-23 LAB — LACTIC ACID, PLASMA
LACTIC ACID, VENOUS: 1.6 mmol/L (ref 0.5–1.9)
Lactic Acid, Venous: 2 mmol/L (ref 0.5–1.9)

## 2017-03-23 LAB — APTT: APTT: 39 s — AB (ref 24–36)

## 2017-03-23 LAB — PROCALCITONIN: PROCALCITONIN: 34.82 ng/mL

## 2017-03-23 MED ORDER — INDOMETHACIN 50 MG RE SUPP
50.0000 mg | Freq: Once | RECTAL | Status: DC
  Filled 2017-03-23: qty 1

## 2017-03-23 MED ORDER — LIP MEDEX EX OINT
TOPICAL_OINTMENT | CUTANEOUS | Status: AC
  Administered 2017-03-23: 05:00:00
  Filled 2017-03-23: qty 7

## 2017-03-23 MED ORDER — PIPERACILLIN-TAZOBACTAM 3.375 G IVPB
3.3750 g | Freq: Three times a day (TID) | INTRAVENOUS | Status: DC
  Administered 2017-03-23 (×3): 3.375 g via INTRAVENOUS
  Filled 2017-03-23 (×4): qty 50

## 2017-03-23 MED ORDER — INFLUENZA VAC SPLIT HIGH-DOSE 0.5 ML IM SUSY
0.5000 mL | PREFILLED_SYRINGE | INTRAMUSCULAR | Status: AC
  Administered 2017-03-26: 0.5 mL via INTRAMUSCULAR
  Filled 2017-03-23 (×2): qty 0.5

## 2017-03-23 MED ORDER — HYDRALAZINE HCL 20 MG/ML IJ SOLN: 10.0000 mg | INTRAMUSCULAR | Status: DC | PRN

## 2017-03-23 MED ORDER — LORAZEPAM 2 MG/ML IJ SOLN
1.0000 mg | Freq: Once | INTRAMUSCULAR | Status: AC
  Administered 2017-03-23: 1 mg via INTRAVENOUS
  Filled 2017-03-23: qty 1

## 2017-03-23 MED ORDER — CEFTRIAXONE SODIUM 2 G IJ SOLR
2.0000 g | Freq: Every day | INTRAMUSCULAR | Status: DC
Start: 2017-03-23 — End: 2017-03-29
  Administered 2017-03-24 – 2017-03-28 (×6): 2 g via INTRAVENOUS
  Filled 2017-03-23 (×7): qty 2

## 2017-03-23 MED ORDER — POTASSIUM CHLORIDE CRYS ER 20 MEQ PO TBCR
40.0000 meq | EXTENDED_RELEASE_TABLET | Freq: Once | ORAL | Status: AC
  Administered 2017-03-23: 40 meq via ORAL
  Filled 2017-03-23: qty 2

## 2017-03-23 MED ORDER — BENZOCAINE 10 % MT GEL
Freq: Four times a day (QID) | OROMUCOSAL | Status: DC | PRN
  Administered 2017-03-23 – 2017-03-24 (×2): via OROMUCOSAL
  Filled 2017-03-23: qty 9.4

## 2017-03-23 MED ORDER — HYDROMORPHONE HCL-NACL 0.5-0.9 MG/ML-% IV SOSY
0.5000 mg | PREFILLED_SYRINGE | INTRAVENOUS | Status: DC | PRN
  Administered 2017-03-24 (×3): 0.5 mg via INTRAVENOUS
  Filled 2017-03-23 (×3): qty 1

## 2017-03-23 MED ORDER — OXYCODONE HCL 5 MG PO TABS
5.0000 mg | ORAL_TABLET | ORAL | Status: DC | PRN
  Administered 2017-03-23 – 2017-03-25 (×3): 5 mg via ORAL
  Filled 2017-03-23 (×3): qty 1

## 2017-03-23 NOTE — ED Notes (Signed)
Gave report to Magda Paganini, RN for assigned room 1521.

## 2017-03-23 NOTE — Progress Notes (Signed)
Pharmacy Antibiotic Note  Peggy Armstrong is a 71 y.o. female admitted on 03/22/2017 with Intra-abdominal infection.  Pharmacy has been consulted for zosyn dosing.  Plan: Zosyn 3.375g IV q8h (4 hour infusion).     Temp (24hrs), Avg:99 F (37.2 C), Min:98.8 F (37.1 C), Max:99.1 F (37.3 C)   Recent Labs Lab 03/22/17 1530 03/22/17 1712 03/23/17 0121 03/23/17 0236  WBC  --  16.9*  --  24.2*  CREATININE 1.39*  --   --  1.48*  LATICACIDVEN  --   --  2.0* 1.6    Estimated Creatinine Clearance: 39.9 mL/min (A) (by C-G formula based on SCr of 1.48 mg/dL (H)).    Allergies  Allergen Reactions  . Codeine Nausea And Vomiting    Made "very sick"    Antimicrobials this admission: Zosyn 03/22/2017 >>   Dose adjustments this admission: -  Microbiology results: pending  Thank you for allowing pharmacy to be a part of this patient's care.  Nani Skillern Crowford 03/23/2017 4:17 AM

## 2017-03-23 NOTE — Consult Note (Signed)
Consultation  Referring Provider:  [facility]/ Hal Hope Primary Care Physician:  Thea Silversmith [name], MD Primary Gastroenterologist:  none  Reason for Consultation:  Acute abdominal pain, elevated LFT's   HPI: Peggy Armstrong is a 71 y.o. female who presented to the emergency room last evening after onset of acute epigastric pain yesterday morning which persisted. This was associated with nausea and vomiting, no documented fever, no diarrhea. Patient has history of DVT, had been on Eliquis at home, history of rheumatoid arthritis, gout, hypertension chronic kidney disease stage III and iron deficiency anemia. She is status post cholecystectomy and hysterectomy.  Evaluation in the emergency room revealed elevated WBC of 16.9, hemoglobin 10.9 lactate 2, INR 1.5 , T bili of 1.8, AST 396, ALT 205, alkaline phosphatase of 318. Potassium was 3 creatinine 1.39. She had CT of the abdomen and pelvis done last night which showed mild intra-and extrahepatic ductal dilation and a CBD of 18 mm, pancreas and liver read as normal. She's been started on Zosyn and blood cultures are pending This morning WBC up to 24.2, T bili up to 3.2, AST 396, ALT 232, alkaline phosphatase 302. Patient has just finished MRCP this morning which shows moderate intrahepatic ductal dilation with a CBD of 1.6 cm and also has left intrahepatic ductal dilation to 10 mm, there is no stone or mass seen, CBD tapers gradually.  Patient is feeling better this morning, she denies any abdominal pain nausea or vomiting. She is hungry. No fever or chills. He is not aware of any fever or chills at onset of her symptoms yesterday. Interestingly she says that over the past year she has had very frequent episodes in the evenings where she is very cold and has chills for 2-3 hours. Patient last took her eliquis yesterday morning, 2.5 mg.   Past Medical History:  Diagnosis Date  . Clotting disorder ([place])    right DVT  . Gout   .  Gout   . Hypertension   . RA (rheumatoid arthritis) ([place])     Past Surgical History:  Procedure Laterality Date  . ABDOMINAL HYSTERECTOMY    . CHOLECYSTECTOMY    . HERNIA REPAIR      Prior to Admission medications   Medication Sig Start Date End Date Taking? Authorizing Provider  albuterol (PROVENTIL HFA;VENTOLIN HFA) 108 (90 Base) MCG/ACT inhaler Inhale 2 puffs into the lungs every 6 (six) hours as needed for wheezing or shortness of breath.  10/28/16 10/28/17 Yes [provider]  allopurinol (ZYLOPRIM) 300 MG tablet Take 300 mg by mouth daily.  05/06/15  Yes [provider]  ALPRAZolam (XANAX) 0.25 MG tablet Take 0.25 mg by mouth at bedtime.  07/21/15  Yes [provider]  apixaban (ELIQUIS) 2.5 MG TABS tablet Take 2.5 mg by mouth 2 (two) times daily.   Yes [provider]  atorvastatin (LIPITOR) 10 MG tablet Take 10 mg by mouth daily. 07/12/16  Yes [provider]  chlorthalidone (HYGROTON) 25 MG tablet Take 25 mg by mouth daily. 07/22/15  Yes [provider]  cholecalciferol (VITAMIN D) 1000 units tablet Take 1,000 Units by mouth daily.   Yes [provider]  cloNIDine (CATAPRES) 0.2 MG tablet Take 0.2 mg by mouth 2 (two) times daily. 07/22/15  Yes [provider]  diltiazem (CARDIZEM CD) 300 MG 24 hr capsule Take 300 mg by mouth daily. 07/22/15  Yes [provider]  lisinopril (PRINIVIL,ZESTRIL) 10 MG tablet Take 10 mg by mouth daily.  07/22/15  Yes [provider]  vitamin B-12 (CYANOCOBALAMIN) 1000 MCG tablet Take 1,000 mcg by mouth daily.   Yes [provider]    Current Facility-Administered Medications  Medication Dose Route Frequency Provider Last Rate Last Dose  . 0.9 %  sodium chloride infusion   Intravenous Continuous Rise Patience, MD 125 mL/hr at 03/23/17 0042    . acetaminophen (TYLENOL) tablet 650 mg  650 mg Oral Q6H PRN Rise Patience, MD       Or  . acetaminophen  (TYLENOL) suppository 650 mg  650 mg Rectal Q6H PRN Rise Patience, MD      . albuterol (PROVENTIL) (2.5 MG/3ML) 0.083% nebulizer solution 2.5 mg  2.5 mg Nebulization Q6H PRN Rise Patience, MD      . hydrALAZINE (APRESOLINE) injection 10 mg  10 mg Intravenous Q4H PRN Rise Patience, MD      . Derrill Memo ON 03/24/2017] Influenza vac split quadrivalent PF (FLUZONE HIGH-DOSE) injection 0.5 mL  0.5 mL Intramuscular Tomorrow-1000 Theodis Blaze, MD      . ondansetron [facility]) tablet 4 mg  4 mg Oral Q6H PRN Rise Patience, MD       Or  . ondansetron [facility]) injection 4 mg  4 mg Intravenous Q6H PRN Rise Patience, MD      . piperacillin-tazobactam (ZOSYN) IVPB 3.375 g  3.375 g Intravenous Q8H Rise Patience, MD 12.5 mL/hr at 03/23/17 0815 3.375 g at 03/23/17 0815    Allergies as of 03/22/2017 - Review Complete 03/22/2017  Allergen Reaction Noted  . Codeine Nausea And Vomiting 08/18/2015    Family History  Problem Relation Age of Onset  . Cancer Father        prostate ca    Social History   Social History  . Marital status: Divorced    Spouse name: N/A  . Number of children: N/A  . Years of education: N/A   Occupational History  . Not on file.   Social History Main Topics  . Smoking status: Never Smoker  . Smokeless tobacco: Never Used  . Alcohol use No  . Drug use: No  . 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   Other Topics Concern  . Not on file   Social History Narrative  . No narrative on file    Review of Systems: Pertinent positive and negative review of systems were noted in the above HPI section.  All other review of systems was otherwise negative. Physical Exam: Vital signs in last 24 hours: Temp:  [97.9 F (36.6 C)-99.1 F (37.3 C)] 97.9 F (36.6 C) (09/27 0532) Pulse Rate:  [71-95] 73 (09/27 0532) Resp:  [17-36] 20 (09/27 0532) BP: (99-142)/(45-108) 111/61 (09/27 0532) SpO2:  [87 %-96 %] 96 %  (09/27 0532) Weight:  [244 lb 11.4 oz (111 kg)] 244 lb 11.4 oz (111 kg) (09/27 0618) Last BM Date: 03/22/17 General:   Alert,  Well-developed, well-nourished, pleasant and cooperative in NAD Head:  Normocephalic and atraumatic. Eyes:  Sclera clear, no icterus.   Conjunctiva pink. Ears:  Normal auditory acuity. Nose:  No deformity, discharge,  or lesions. Mouth:  No deformity or lesions.   Neck:  Supple; no masses or thyromegaly. Lungs:  Clear throughout to auscultation.   occas wheeze. Heart:  Regular rate and rhythm; no murmurs, clicks, rubs,  or gallops. Abdomen:  Soft, basically nontender, BS active,nonpalp mass or hsm.   Rectal:  Deferred  Msk:  Symmetrical without gross deformities. . Pulses:  Normal pulses noted. Extremities:  Without clubbing or edema. Neurologic:  Alert and  oriented x4;  grossly normal neurologically. Skin:  Intact without significant lesions or rashes.. Psych:  Alert and cooperative. Normal mood and affect.  Intake/Output from previous day: 09/26 0701 - 09/27 0700 In: 666.7 [I.V.:616.7; IV Piggyback:50] Out: -  Intake/Output this shift: No intake/output data recorded.  Lab Results:  Recent Labs  03/22/17 1712 03/23/17 0236  WBC 16.9* 24.2*  HGB 10.9* 10.2*  HCT 34.9* 32.5*  PLT 262 210   BMET  Recent Labs  03/22/17 1530 03/23/17 0236  NA 139 138  K 3.0* 3.3*  CL 101 102  CO2 26 24  GLUCOSE 153* 118*  BUN 28* 24*  CREATININE 1.39* 1.48*  CALCIUM 9.3 8.6*   LFT  Recent Labs  03/23/17 0236  PROT 6.4*  ALBUMIN 3.1*  AST 318*  ALT 232*  ALKPHOS 302*  BILITOT 3.2*  BILIDIR 2.0*  IBILI 1.2*   PT/INR  Recent Labs  03/23/17 0121  LABPROT 18.2*  INR 1.52     IMPRESSION:  #25 71 year old white female status post remote cholecystectomy presenting with acute epigastric pain nausea and vomiting onset yesterday morning and progressive. Patient has leukocytosis and elevated LFTs with rise in WBC and total bili today. She is  hemodynamically stable, afebrile and symptomatically feels better today.  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 Wilfrid Lund tomorrow at 12:30. Procedure discussed in detail with the patient including risks and benefits. We discussed potential risk of bleeding and perforation and 5% risk of pancreatitis. She is agreeable to proceed. Patient is stable and currently feeling better, therefore preferred to do procedure tomorrow to allow eliquis to wash out. Should she develop any change in her status, concerning for sepsis would require emergency procedure later today with stenting. We will follow closely.   Lerlene Treadwell  03/23/2017, 9:28 AM

## 2017-03-23 NOTE — Progress Notes (Signed)
CRITICAL VALUE ALERT  Critical Value:  Lactic 2.0  Date & Time Notied:  03/23/17 0240  Provider Notified: Lamar Blinks  Orders Received/Actions taken: awaiting cb

## 2017-03-23 NOTE — Progress Notes (Signed)
Attempted to get report from ED x2 times. On hold with no answer.

## 2017-03-23 NOTE — ED Notes (Signed)
Assisted patient to the restroom and back to stretcher. Patient tolerated it well. Changed patients sheets and incontinence pad. Provided a gown.

## 2017-03-23 NOTE — Progress Notes (Signed)
Patient arrived to room 1521 from ED. Patient is from home, lives with daughter, Alert and Oriented x4. Patient has left forearm IV site infusing NS at 125. Patient has bruises to arms, 1+ edema to lower ext. Patient uses cane at home to ambulate and is fairly steady on her feet. Patient has glasses with her. Patient oriented to room, call bell, bed use and plan of care at this point. Reminded she is NPO. Patient verbalized understanding. Will c/t monitor.

## 2017-03-23 NOTE — Progress Notes (Signed)
MRI called to take patient down for MRI. Advised that patient did say she is claustrophobic so MRI requested that provider on call be paged to request Ativan so test can be performed. Awaiting callback.

## 2017-03-23 NOTE — Progress Notes (Signed)
Patient ID: NEFTALY INZUNZA, female   DOB: 08-Jun-1946, 71 y.o.   MRN: [id number]    PROGRESS NOTE  DAELYNN BLOWER  IWP:[id number] DOB: 1945/10/11 DOA: 03/22/2017  PCP: Thurman Coyer, MD   Brief Narrative:  71 y.o. female with history of DVT, rheumatoid arthritis, hypertension presented to the ER with epigastric pain associated with nausea and non bloody vomiting, poor oral intake. CT abd in ED notable for mild intra and extrahepatic biliary ductal dilation. GI consulted.  Assessment & Plan:   Principal Problem:   SIRS (systemic inflammatory response syndrome) (HCC), transaminitis, cholangitis  - secondary to likely cholangitis - MRCP notable for dilated CBD and left intrahepatic biliary tree - LFT's still elevated and WBC is trending up - pt has been started on Zosyn and will continue same regimen for now, day #2 - GI consulted, plan for ERCP but as pt was on Eliquis, this has been held and timing of ERCP to be determined - will provide analgesia and antiemetics as needed - appreciate GI team following  - will repeat CMET and CBC in AM  Active Problems:   Right leg DVT ([place]) - Eliquis has been on hold in an anticipation of an intervention     Acute kidney injury - appears to be pre renal in etiology in the setting of acute illness - keep on IVF - BMP in AM    Hypokalemia - supplement - BMP in AM    RA (rheumatoid arthritis) ([place]) - outpatient follow up     Iron deficiency anemia - pt reports intermittent bleeding in the past requiring transfusions - Hg overall stable, will repeat CBC in AM    Morbid obesity  - Body mass index is 46.24 kg/m  DVT prophylaxis: Eliquis has been on hold  Code Status: Full  Family Communication: Patient at bedside  Disposition Plan: to be determined   Consultants:   GI  Procedures:   MRCP 9/27 - results below   Antimicrobials:   Zosyn 9/26 -->  Subjective: Pt reports feeling better but still with intermittent  discomfort in epigastric area  Objective: Vitals:   03/23/17 0250 03/23/17 0532 03/23/17 0618 03/23/17 1317  BP: (!) 113/57 111/61  130/75  Pulse: 81 73  81  Resp: 20 20  20   Temp: 99.1 F (37.3 C) 97.9 F (36.6 C)  98.2 F (36.8 C)  TempSrc: Oral Oral  Oral  SpO2: 96% 96%  95%  Weight:   111 kg (244 lb 11.4 oz)   Height:   5\' 1"  (1.549 m)     Intake/Output Summary (Last 24 hours) at 03/23/17 1545 Last data filed at 03/23/17 1200  Gross per 24 hour  Intake           906.67 ml  Output                0 ml  Net           906.67 ml   Filed Weights   03/23/17 0618  Weight: 111 kg (244 lb 11.4 oz)    Examination:  General exam: Appears calm and comfortable  Respiratory system: Clear to auscultation. Respiratory effort normal. Cardiovascular system: S1 & S2 heard, RRR. No JVD, rubs, gallops or clicks. No pedal edema. Gastrointestinal system: Abdomen is soft and nontender. No organomegaly or masses felt.  Central nervous system: Alert and oriented. No focal neurological deficits. Extremities: Symmetric 5 x 5 power. Skin: No rashes, lesions or ulcers Psychiatry: Judgement and insight  appear normal. Mood & affect appropriate.    Data Reviewed: I have personally reviewed following labs and imaging studies  CBC:  Recent Labs Lab 03/22/17 1712 03/23/17 0236  WBC 16.9* 24.2*  NEUTROABS 16.2* 22.1*  HGB 10.9* 10.2*  HCT 34.9* 32.5*  MCV 82.3 83.1  PLT 262 073   Basic Metabolic Panel:  Recent Labs Lab 03/22/17 1530 03/23/17 0236  NA 139 138  K 3.0* 3.3*  CL 101 102  CO2 26 24  GLUCOSE 153* 118*  BUN 28* 24*  CREATININE 1.39* 1.48*  CALCIUM 9.3 8.6*   Liver Function Tests:  Recent Labs Lab 03/22/17 1530 03/23/17 0236  AST 396* 318*  ALT 215* 232*  ALKPHOS 318* 302*  BILITOT 1.8* 3.2*  PROT 7.3 6.4*  ALBUMIN 3.5 3.1*    Recent Labs Lab 03/22/17 1530  LIPASE 37   Coagulation Profile:  Recent Labs Lab 03/23/17 0121  INR 1.52    CBG:  Recent Labs Lab 03/23/17 0010 03/23/17 0538 03/23/17 1137  GLUCAP 107* 116* 99   Urine analysis:    Component Value Date/Time   COLORURINE AMBER (A) 03/22/2017 1530   APPEARANCEUR HAZY (A) 03/22/2017 1530   LABSPEC 1.017 03/22/2017 1530   PHURINE 5.0 03/22/2017 1530   GLUCOSEU NEGATIVE 03/22/2017 1530   HGBUR NEGATIVE 03/22/2017 1530   BILIRUBINUR NEGATIVE 03/22/2017 1530   [place] 03/22/2017 1530   PROTEINUR NEGATIVE 03/22/2017 1530   NITRITE NEGATIVE 03/22/2017 1530   [place] 03/22/2017 1530   Radiology Studies: Ct Abdomen Pelvis W Contrast  Result Date: 03/22/2017 CLINICAL DATA:  Mid abdominal pain and right upper quadrant pain with nausea and vomiting EXAM: CT ABDOMEN AND PELVIS WITH CONTRAST TECHNIQUE: Multidetector CT imaging of the abdomen and pelvis was performed using the standard protocol following bolus administration of intravenous contrast. CONTRAST:  77mL ISOVUE-300 IOPAMIDOL (ISOVUE-300) INJECTION 61% COMPARISON:  None. FINDINGS: Lower chest: Lung bases demonstrate linear scarring. No acute consolidation or pleural effusion. Normal heart size. Hepatobiliary: Mild intra hepatic and moderate extrahepatic biliary dilatation with common duct measuring up to 18 mm. Surgical absence of the gallbladder. Pancreas: Unremarkable. No pancreatic ductal dilatation or surrounding inflammatory changes. Spleen: Normal in size without focal abnormality. Adrenals/Urinary Tract: Adrenal glands are within normal limits. Small cyst in the left kidney. No hydronephrosis. The bladder is normal Stomach/Bowel: The stomach is nonenlarged. No dilated small bowel. No colon wall thickening. Sigmoid colon diverticular disease without acute inflammation Vascular/Lymphatic: Aortic atherosclerosis. No enlarged abdominal or pelvic lymph nodes. Reproductive: Status post hysterectomy. No adnexal masses. Other: Negative for free air or free fluid. Musculoskeletal: Degenerative  changes. 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 IMPRESSION: 1. Mild intra hepatic and moderate extrahepatic biliary dilatation, post cholecystectomy. Recommend correlation with laboratory values, follow-up MR or ERCP as indicated 2. Sigmoid colon diverticular disease without acute inflammation. Electronically Signed   By: Donavan Foil M.D.   On: 03/22/2017 21:48   Mr Abdomen Mrcp Wo Contrast  Result Date: 03/23/2017 CLINICAL DATA:  Abdominal pain with nausea and vomiting. Biliary duct dilatation on CT. EXAM: MRI ABDOMEN WITHOUT CONTRAST  (INCLUDING MRCP) TECHNIQUE: Multiplanar multisequence MR imaging of the abdomen was performed. Heavily T2-weighted images of the biliary and pancreatic ducts were obtained, and three-dimensional MRCP images were rendered by post processing. COMPARISON:  Yesterday CT FINDINGS: Mild to moderate motion degradation throughout. Lower chest: Mild cardiomegaly.  Small hiatal hernia. Hepatobiliary: No focal liver lesion. Mild decreased signal on inphase imaging, suggesting iron deposition in the liver.  Moderate intrahepatic biliary duct dilatation. The left hepatic duct measures 10 mm on image 81/series 4. Moderate common duct dilatation, with the common duct measuring maximally 1.6 cm in the porta hepatis on image 86/ series 4. Tapers gradually distally. No choledocholithiasis. No well-defined obstructive mass. Pancreas: Pancreatic atrophy. Duct size upper normal, including at 4 mm on image 71/ series 4. No acute pancreatitis. Spleen:  Normal in size, without focal abnormality. Adrenals/Urinary Tract: Normal adrenal glands. Mild renal cortical thinning bilaterally. Upper pole 1.6 cm left renal cyst. An interpolar 6 mm T1 hyperintense left renal lesion on image 66/series 12 is likely a complex cyst, but is incompletely characterized. Stomach/Bowel: Normal remainder of the stomach and abdominal bowel loops. Vascular/Lymphatic: Aortic and branch vessel atherosclerosis. Upper normal  size porta hepatis nodes are likely reactive. Multiple small retroperitoneal nodes, none of which are pathologic by size criteria. Other:  No ascites. Musculoskeletal: No acute osseous abnormality. IMPRESSION: 1. Mild to moderately motion degraded exam. 2. Biliary duct dilatation after cholecystectomy. No choledocholithiasis or well-defined obstructive mass. Given the elevated bilirubin, consider further evaluation with ERCP to exclude ampullary stricture or otherwise occult ampullary lesion. 3. Hiatal hernia. 4. 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 Electronically Signed   By: Abigail Miyamoto M.D.   On: 03/23/2017 07:52   Mr 3d Recon At Scanner  Result Date: 03/23/2017 CLINICAL DATA:  Abdominal pain with nausea and vomiting. Biliary duct dilatation on CT. EXAM: MRI ABDOMEN WITHOUT CONTRAST  (INCLUDING MRCP) TECHNIQUE: Multiplanar multisequence MR imaging of the abdomen was performed. Heavily T2-weighted images of the biliary and pancreatic ducts were obtained, and three-dimensional MRCP images were rendered by post processing. COMPARISON:  Yesterday CT FINDINGS: Mild to moderate motion degradation throughout. Lower chest: Mild cardiomegaly.  Small hiatal hernia. Hepatobiliary: No focal liver lesion. Mild decreased signal on inphase imaging, suggesting iron deposition in the liver. Moderate intrahepatic biliary duct dilatation. The left hepatic duct measures 10 mm on image 81/series 4. Moderate common duct dilatation, with the common duct measuring maximally 1.6 cm in the porta hepatis on image 86/ series 4. Tapers gradually distally. No choledocholithiasis. No well-defined obstructive mass. Pancreas: Pancreatic atrophy. Duct size upper normal, including at 4 mm on image 71/ series 4. No acute pancreatitis. Spleen:  Normal in size, without focal abnormality. Adrenals/Urinary Tract: Normal adrenal glands. Mild renal cortical thinning bilaterally. Upper pole 1.6 cm left renal cyst. An interpolar 6 mm T1 hyperintense left  renal lesion on image 66/series 12 is likely a complex cyst, but is incompletely characterized. Stomach/Bowel: Normal remainder of the stomach and abdominal bowel loops. Vascular/Lymphatic: Aortic and branch vessel atherosclerosis. Upper normal size porta hepatis nodes are likely reactive. Multiple small retroperitoneal nodes, none of which are pathologic by size criteria. Other:  No ascites. Musculoskeletal: No acute osseous abnormality. IMPRESSION: 1. Mild to moderately motion degraded exam. 2. Biliary duct dilatation after cholecystectomy. No choledocholithiasis or well-defined obstructive mass. Given the elevated bilirubin, consider further evaluation with ERCP to exclude ampullary stricture or otherwise occult ampullary lesion. 3. Hiatal hernia. 4. 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 Electronically Signed   By: Abigail Miyamoto M.D.   On: 03/23/2017 07:52   Scheduled Meds: . [START ON 03/24/2017] indomethacin  50 mg Rectal Once  . [START ON 03/24/2017] Influenza vac split quadrivalent PF  0.5 mL Intramuscular Tomorrow-1000   Continuous Infusions: . sodium chloride 125 mL/hr at 03/23/17 0948  . piperacillin-tazobactam (ZOSYN)  IV 3.375 g (03/23/17 1442)    LOS: 1 day   Time spent:  35 minutes   Faye Ramsay, MD [facility] Pager [phone]  If 7PM-7AM, please contact night-coverage [email or web address] Password [facility] 03/23/2017, 3:45 PM

## 2017-03-23 NOTE — Care Management Note (Signed)
Case Management Note  Patient Details  Name: Peggy Armstrong MRN: [id number] Date of Birth: January 15, 1946  Subjective/Objective:                  abd pain   Action/Plan: Date:  March 23, 2017 Chart reviewed for concurrent status and case management needs.  Will continue to follow patient progress.  Discharge Planning: following for needs  Expected discharge date: [id number]  Velva Harman, BSN, [place], [place]   Expected Discharge Date:                  Expected Discharge Plan:  Home/Self Care  In-House Referral:     Discharge planning Services  CM Consult  Post Acute Care Choice:    Choice offered to:     DME Arranged:    DME Agency:     HH Arranged:    HH Agency:     Status of Service:  In process, will continue to follow  If discussed at Long Length of Stay Meetings, dates discussed:    Additional Comments:  Leeroy Cha, RN 03/23/2017, 1:44 PM

## 2017-03-24 ENCOUNTER — Inpatient Hospital Stay (HOSPITAL_COMMUNITY): Payer: Medicare Other

## 2017-03-24 ENCOUNTER — Inpatient Hospital Stay (HOSPITAL_COMMUNITY): Payer: Medicare Other | Admitting: Certified Registered Nurse Anesthetist

## 2017-03-24 ENCOUNTER — Encounter (HOSPITAL_COMMUNITY)
Admission: EM | Disposition: A | Discharge status: Home Health | Payer: Self-pay | Source: Home / Self Care | Attending: Internal Medicine

## 2017-03-24 ENCOUNTER — Ambulatory Visit: Payer: Medicare Other

## 2017-03-24 DIAGNOSIS — K317 Polyp of stomach and duodenum: Secondary | ICD-10-CM

## 2017-03-24 DIAGNOSIS — Z7901 Long term (current) use of anticoagulants: Secondary | ICD-10-CM

## 2017-03-24 DIAGNOSIS — I82501 Chronic embolism and thrombosis of unspecified deep veins of right lower extremity: Secondary | ICD-10-CM

## 2017-03-24 DIAGNOSIS — K830 Cholangitis: Secondary | ICD-10-CM

## 2017-03-24 DIAGNOSIS — M0579 Rheumatoid arthritis with rheumatoid factor of multiple sites without organ or systems involvement: Secondary | ICD-10-CM

## 2017-03-24 DIAGNOSIS — K3189 Other diseases of stomach and duodenum: Secondary | ICD-10-CM

## 2017-03-24 HISTORY — PX: ESOPHAGOGASTRODUODENOSCOPY (EGD) WITH PROPOFOL: SHX5813

## 2017-03-24 LAB — COMPREHENSIVE METABOLIC PANEL
ALBUMIN: 3 g/dL — AB (ref 3.5–5.0)
ALT: 148 U/L — ABNORMAL HIGH (ref 14–54)
ANION GAP: 11 (ref 5–15)
AST: 139 U/L — ABNORMAL HIGH (ref 15–41)
Alkaline Phosphatase: 267 U/L — ABNORMAL HIGH (ref 38–126)
BILIRUBIN TOTAL: 3.4 mg/dL — AB (ref 0.3–1.2)
BUN: 18 mg/dL (ref 6–20)
CO2: 25 mmol/L (ref 22–32)
Calcium: 8.4 mg/dL — ABNORMAL LOW (ref 8.9–10.3)
Chloride: 103 mmol/L (ref 101–111)
Creatinine, Ser: 1.43 mg/dL — ABNORMAL HIGH (ref 0.44–1.00)
GFR calc Af Amer: 42 mL/min — ABNORMAL LOW (ref >60)
GFR calc non Af Amer: 36 mL/min — ABNORMAL LOW (ref >60)
GLUCOSE: 114 mg/dL — AB (ref 65–99)
POTASSIUM: 2.9 mmol/L — AB (ref 3.5–5.1)
Sodium: 139 mmol/L (ref 135–145)
Total Protein: 6.4 g/dL — ABNORMAL LOW (ref 6.5–8.1)

## 2017-03-24 LAB — CBC
HCT: 34.1 % — ABNORMAL LOW (ref 36.0–46.0)
HEMOGLOBIN: 10.6 g/dL — AB (ref 12.0–15.0)
MCH: 25.5 pg — ABNORMAL LOW (ref 26.0–34.0)
MCHC: 31.1 g/dL (ref 30.0–36.0)
MCV: 82 fL (ref 78.0–100.0)
Platelets: 171 10*3/uL (ref 150–400)
RBC: 4.16 MIL/uL (ref 3.87–5.11)
RDW: 18.4 % — AB (ref 11.5–15.5)
WBC: 12.5 10*3/uL — AB (ref 4.0–10.5)

## 2017-03-24 LAB — GLUCOSE, CAPILLARY
GLUCOSE-CAPILLARY: 117 mg/dL — AB (ref 65–99)
GLUCOSE-CAPILLARY: 136 mg/dL — AB (ref 65–99)
Glucose-Capillary: 102 mg/dL — ABNORMAL HIGH (ref 65–99)
Glucose-Capillary: 120 mg/dL — ABNORMAL HIGH (ref 65–99)

## 2017-03-24 LAB — MAGNESIUM: MAGNESIUM: 1.5 mg/dL — AB (ref 1.7–2.4)

## 2017-03-24 LAB — PROTIME-INR
INR: 1.3
Prothrombin Time: 16.1 seconds — ABNORMAL HIGH (ref 11.4–15.2)

## 2017-03-24 LAB — POTASSIUM: Potassium: 3.4 mmol/L — ABNORMAL LOW (ref 3.5–5.1)

## 2017-03-24 SURGERY — ESOPHAGOGASTRODUODENOSCOPY (EGD) WITH PROPOFOL
Anesthesia: General

## 2017-03-24 MED ORDER — MIDAZOLAM HCL 2 MG/2ML IJ SOLN
INTRAMUSCULAR | Status: AC
  Filled 2017-03-24: qty 2

## 2017-03-24 MED ORDER — LIDOCAINE HCL 1 % IJ SOLN
INTRAMUSCULAR | Status: AC | PRN
  Administered 2017-03-24: 10 mL

## 2017-03-24 MED ORDER — IOPAMIDOL (ISOVUE-300) INJECTION 61%
50.0000 mL | Freq: Once | INTRAVENOUS | Status: AC | PRN
  Administered 2017-03-24: 20 mL via INTRAVENOUS

## 2017-03-24 MED ORDER — LORAZEPAM 2 MG/ML IJ SOLN
0.5000 mg | Freq: Once | INTRAMUSCULAR | Status: AC
  Administered 2017-03-24: 0.5 mg via INTRAVENOUS
  Filled 2017-03-24: qty 1

## 2017-03-24 MED ORDER — LACTATED RINGERS IV SOLN
INTRAVENOUS | Status: DC | PRN
  Administered 2017-03-24: 12:00:00 via INTRAVENOUS

## 2017-03-24 MED ORDER — PROPOFOL 10 MG/ML IV BOLUS
INTRAVENOUS | Status: AC
  Filled 2017-03-24: qty 20

## 2017-03-24 MED ORDER — MIDAZOLAM HCL 2 MG/2ML IJ SOLN
INTRAMUSCULAR | Status: AC
  Filled 2017-03-24: qty 4

## 2017-03-24 MED ORDER — INDOMETHACIN 50 MG RE SUPP
RECTAL | Status: AC
  Filled 2017-03-24: qty 2

## 2017-03-24 MED ORDER — FENTANYL CITRATE (PF) 100 MCG/2ML IJ SOLN
INTRAMUSCULAR | Status: DC | PRN
  Administered 2017-03-24 (×2): 25 ug via INTRAVENOUS

## 2017-03-24 MED ORDER — FENTANYL CITRATE (PF) 100 MCG/2ML IJ SOLN
INTRAMUSCULAR | Status: AC
  Filled 2017-03-24: qty 4

## 2017-03-24 MED ORDER — LIDOCAINE 2% (20 MG/ML) 5 ML SYRINGE
INTRAMUSCULAR | Status: DC | PRN
  Administered 2017-03-24: 60 mg via INTRAVENOUS

## 2017-03-24 MED ORDER — SODIUM CHLORIDE 0.9 % IV SOLN
INTRAVENOUS | Status: DC
  Administered 2017-03-24: 10:00:00 via INTRAVENOUS
  Filled 2017-03-24 (×2): qty 1000

## 2017-03-24 MED ORDER — ONDANSETRON HCL 4 MG/2ML IJ SOLN
INTRAMUSCULAR | Status: AC
  Filled 2017-03-24: qty 2

## 2017-03-24 MED ORDER — ONDANSETRON HCL 4 MG/2ML IJ SOLN
INTRAMUSCULAR | Status: AC
  Administered 2017-03-24: 4 mg via INTRAVENOUS
  Filled 2017-03-24: qty 2

## 2017-03-24 MED ORDER — PROPOFOL 10 MG/ML IV BOLUS
INTRAVENOUS | Status: DC | PRN
  Administered 2017-03-24 (×2): 50 mg via INTRAVENOUS

## 2017-03-24 MED ORDER — SUCCINYLCHOLINE CHLORIDE 200 MG/10ML IV SOSY
PREFILLED_SYRINGE | INTRAVENOUS | Status: DC | PRN
  Administered 2017-03-24: 120 mg via INTRAVENOUS

## 2017-03-24 MED ORDER — MIDAZOLAM HCL 2 MG/2ML IJ SOLN
INTRAMUSCULAR | Status: DC | PRN
  Administered 2017-03-24 (×2): 1 mg via INTRAVENOUS

## 2017-03-24 MED ORDER — FENTANYL CITRATE (PF) 100 MCG/2ML IJ SOLN
INTRAMUSCULAR | Status: AC
  Filled 2017-03-24: qty 2

## 2017-03-24 MED ORDER — ESMOLOL HCL 100 MG/10ML IV SOLN
INTRAVENOUS | Status: AC
  Filled 2017-03-24: qty 10

## 2017-03-24 MED ORDER — MAGNESIUM SULFATE 4 GM/100ML IV SOLN
4.0000 g | Freq: Once | INTRAVENOUS | Status: AC
  Administered 2017-03-24: 4 g via INTRAVENOUS
  Filled 2017-03-24: qty 100

## 2017-03-24 MED ORDER — IOPAMIDOL (ISOVUE-300) INJECTION 61%
INTRAVENOUS | Status: AC
  Filled 2017-03-24: qty 100

## 2017-03-24 MED ORDER — DILTIAZEM HCL ER COATED BEADS 180 MG PO CP24
300.0000 mg | ORAL_CAPSULE | Freq: Every day | ORAL | Status: DC
  Administered 2017-03-25: 300 mg via ORAL
  Filled 2017-03-24: qty 1

## 2017-03-24 MED ORDER — SODIUM CHLORIDE 0.9% FLUSH
5.0000 mL | Freq: Three times a day (TID) | INTRAVENOUS | Status: DC
  Administered 2017-03-26 – 2017-03-30 (×12): 5 mL via INTRAVENOUS

## 2017-03-24 MED ORDER — PIPERACILLIN-TAZOBACTAM 3.375 G IVPB
3.3750 g | Freq: Once | INTRAVENOUS | Status: AC
  Administered 2017-03-24: 3.375 g via INTRAVENOUS
  Filled 2017-03-24: qty 50

## 2017-03-24 MED ORDER — POTASSIUM CHLORIDE 10 MEQ/100ML IV SOLN
10.0000 meq | INTRAVENOUS | Status: AC
  Administered 2017-03-24 (×4): 10 meq via INTRAVENOUS
  Filled 2017-03-24 (×4): qty 100

## 2017-03-24 MED ORDER — GLUCAGON HCL RDNA (DIAGNOSTIC) 1 MG IJ SOLR
INTRAMUSCULAR | Status: AC
  Filled 2017-03-24: qty 1

## 2017-03-24 MED ORDER — PHENYLEPHRINE 40 MCG/ML (10ML) SYRINGE FOR IV PUSH (FOR BLOOD PRESSURE SUPPORT)
PREFILLED_SYRINGE | INTRAVENOUS | Status: AC
  Filled 2017-03-24: qty 10

## 2017-03-24 MED ORDER — FENTANYL CITRATE (PF) 100 MCG/2ML IJ SOLN
INTRAMUSCULAR | Status: AC | PRN
  Administered 2017-03-24 (×4): 50 ug via INTRAVENOUS

## 2017-03-24 MED ORDER — LACTATED RINGERS IV SOLN
INTRAVENOUS | Status: DC
  Administered 2017-03-24: 19:00:00 via INTRAVENOUS

## 2017-03-24 MED ORDER — POTASSIUM CHLORIDE IN NACL 20-0.9 MEQ/L-% IV SOLN
INTRAVENOUS | Status: DC
  Filled 2017-03-24: qty 1000

## 2017-03-24 MED ORDER — DILTIAZEM HCL 25 MG/5ML IV SOLN
10.0000 mg | Freq: Once | INTRAVENOUS | Status: AC
  Administered 2017-03-24: 10 mg via INTRAVENOUS
  Filled 2017-03-24: qty 5

## 2017-03-24 MED ORDER — IOPAMIDOL (ISOVUE-300) INJECTION 61%
INTRAVENOUS | Status: AC
  Administered 2017-03-24: 20 mL via INTRAVENOUS
  Filled 2017-03-24: qty 50

## 2017-03-24 MED ORDER — LIDOCAINE 2% (20 MG/ML) 5 ML SYRINGE
INTRAMUSCULAR | Status: AC
  Filled 2017-03-24: qty 5

## 2017-03-24 MED ORDER — MIDAZOLAM HCL 2 MG/2ML IJ SOLN
INTRAMUSCULAR | Status: AC | PRN
  Administered 2017-03-24 (×2): 1 mg via INTRAVENOUS

## 2017-03-24 MED ORDER — LIDOCAINE HCL 1 % IJ SOLN
INTRAMUSCULAR | Status: AC
  Filled 2017-03-24: qty 20

## 2017-03-24 MED ORDER — ALLOPURINOL 300 MG PO TABS
300.0000 mg | ORAL_TABLET | Freq: Every day | ORAL | Status: DC
  Administered 2017-03-25 – 2017-03-30 (×6): 300 mg via ORAL
  Filled 2017-03-24 (×2): qty 1
  Filled 2017-03-24 (×2): qty 3
  Filled 2017-03-24: qty 1
  Filled 2017-03-24: qty 3

## 2017-03-24 MED ORDER — PIPERACILLIN-TAZOBACTAM 3.375 G IVPB
INTRAVENOUS | Status: AC
  Administered 2017-03-24: 3.375 g via INTRAVENOUS
  Filled 2017-03-24: qty 50

## 2017-03-24 MED ORDER — METOPROLOL TARTRATE 5 MG/5ML IV SOLN
5.0000 mg | Freq: Once | INTRAVENOUS | Status: AC
  Administered 2017-03-24: 5 mg via INTRAVENOUS
  Filled 2017-03-24: qty 5

## 2017-03-24 NOTE — Procedures (Signed)
Interventional Radiology Procedure Note  Procedure: US guided left bile duct access, with cholangiogram and placement of externalized 62F biliary drain.   Complications: None  Recommendations:  - To gravity drain - Do not submerge  - Routine drain care, with TID sterile saline flush.  Do not aspirate the drain.  - Will schedule for drain injection and drain exchange for placement of int/ext drain next week.    Signed,  Dulcy Fanny. Earleen Newport, DO

## 2017-03-24 NOTE — Anesthesia Preprocedure Evaluation (Signed)
Anesthesia Evaluation  Patient identified by MRN, date of birth, ID band Patient awake    Reviewed: Allergy & Precautions, NPO status , Patient's Chart, lab work & pertinent test results  Airway Mallampati: II  TM Distance: >3 FB Neck ROM: Full    Dental no notable dental hx.    Pulmonary neg pulmonary ROS,    Pulmonary exam normal breath sounds clear to auscultation       Cardiovascular hypertension, Pt. on medications negative cardio ROS Normal cardiovascular exam Rhythm:Regular Rate:Normal     Neuro/Psych negative neurological ROS  negative psych ROS   GI/Hepatic negative GI ROS, Neg liver ROS,   Endo/Other  negative endocrine ROSMorbid obesity  Renal/GU negative Renal ROS  negative genitourinary   Musculoskeletal negative musculoskeletal ROS (+) Arthritis ,   Abdominal   Peds negative pediatric ROS (+)  Hematology negative hematology ROS (+)   Anesthesia Other Findings   Reproductive/Obstetrics negative OB ROS                             Anesthesia Physical Anesthesia Plan  ASA: III  Anesthesia Plan: General   Post-op Pain Management:    Induction: Intravenous  PONV Risk Score and Plan: 3 and Ondansetron, Dexamethasone, Midazolam and Treatment may vary due to age or medical condition  Airway Management Planned: Oral ETT  Additional Equipment:   Intra-op Plan:   Post-operative Plan: Extubation in OR  Informed Consent: I have reviewed the patients History and Physical, chart, labs and discussed the procedure including the risks, benefits and alternatives for the proposed anesthesia with the patient or authorized representative who has indicated his/her understanding and acceptance.   Dental advisory given  Plan Discussed with: CRNA  Anesthesia Plan Comments:         Anesthesia Quick Evaluation

## 2017-03-24 NOTE — Anesthesia Postprocedure Evaluation (Signed)
Anesthesia Post Note  Patient: Peggy Armstrong  Procedure(s) Performed: Procedure(s) (LRB): ESOPHAGOGASTRODUODENOSCOPY (EGD) WITH PROPOFOL (N/A)     Patient location during evaluation: PACU Anesthesia Type: General Level of consciousness: awake and alert Pain management: pain level controlled Vital Signs Assessment: post-procedure vital signs reviewed and stable Respiratory status: spontaneous breathing, nonlabored ventilation and respiratory function stable Cardiovascular status: blood pressure returned to baseline and stable Postop Assessment: no apparent nausea or vomiting Anesthetic complications: no    Last Vitals:  Vitals:   03/24/17 1410 03/24/17 1420  BP: (!) 144/106 139/87  Pulse: (!) 102 (!) 101  Resp: (!) 31 (!) 21  Temp:    SpO2: 95% 94%    Last Pain:  Vitals:   03/24/17 1400  TempSrc: Oral  PainSc:                  Lynda Rainwater

## 2017-03-24 NOTE — Anesthesia Procedure Notes (Signed)
Procedure Name: Intubation Date/Time: 03/24/2017 1:06 PM Performed by: Dione Booze Pre-anesthesia Checklist: Suction available, Patient being monitored, Emergency Drugs available and Patient identified Patient Re-evaluated:Patient Re-evaluated prior to induction Oxygen Delivery Method: Circle system utilized Preoxygenation: Pre-oxygenation with 100% oxygen Induction Type: IV induction Grade View: Grade I Tube type: Subglottic suction tube Tube size: 7.5 mm Number of attempts: 1 Airway Equipment and Method: Stylet Placement Confirmation: ETT inserted through vocal cords under direct vision,  positive ETCO2 and breath sounds checked- equal and bilateral Secured at: 20 cm Tube secured with: Tape Dental Injury: Teeth and Oropharynx as per pre-operative assessment

## 2017-03-24 NOTE — H&P (View-Only) (Signed)
Progress Note   Subjective  Patient reports feeling okay this AM. No abdominal pain. Her main complaint is her mouth is sore, gums bothering her. Otherwise has had some sinus tachycardia overnight, BP normal. She reports feeling quite anxious about ERCP.   Objective   Vital signs in last 24 hours: Temp:  [98.2 F (36.8 C)-99 F (37.2 C)] 98.4 F (36.9 C) (09/28 0555) Pulse Rate:  [81-106] 106 (09/27 2113) Resp:  [20-22] 22 (09/28 0555) BP: (130-145)/(75-95) 145/95 (09/28 0555) SpO2:  [91 %-95 %] 91 % (09/28 0555) Weight:  [245 lb 14.4 oz (111.5 kg)] 245 lb 14.4 oz (111.5 kg) (09/28 0555) Last BM Date: 03/22/17 General:    white female in NAD Heart:  tachycardic Lungs: Respirations even and unlabored Abdomen:  Soft, nontender, protuberant Extremities:  Without edema. Neurologic:  Alert and oriented,  grossly normal neurologically. Psych:  Cooperative. Normal mood and affect.  Intake/Output from previous day: 09/27 0701 - 09/28 0700 In: 740 [P.O.:240; I.V.:500] Out: -  Intake/Output this shift: No intake/output data recorded.  Lab Results:  Recent Labs  03/22/17 1712 03/23/17 0236 03/24/17 0544  WBC 16.9* 24.2* 12.5*  HGB 10.9* 10.2* 10.6*  HCT 34.9* 32.5* 34.1*  PLT 262 210 171   BMET  Recent Labs  03/22/17 1530 03/23/17 0236 03/24/17 0544  NA 139 138 139  K 3.0* 3.3* 2.9*  CL 101 102 103  CO2 26 24 25   GLUCOSE 153* 118* 114*  BUN 28* 24* 18  CREATININE 1.39* 1.48* 1.43*  CALCIUM 9.3 8.6* 8.4*   LFT  Recent Labs  03/23/17 0236 03/24/17 0544  PROT 6.4* 6.4*  ALBUMIN 3.1* 3.0*  AST 318* 139*  ALT 232* 148*  ALKPHOS 302* 267*  BILITOT 3.2* 3.4*  BILIDIR 2.0*  --   IBILI 1.2*  --    PT/INR  Recent Labs  03/23/17 0121 03/24/17 0717  LABPROT 18.2* 16.1*  INR 1.52 1.30    Studies/Results: Ct Abdomen Pelvis W Contrast  Result Date: 03/22/2017 CLINICAL DATA:  Mid abdominal pain and right upper quadrant pain with nausea and  vomiting EXAM: CT ABDOMEN AND PELVIS WITH CONTRAST TECHNIQUE: Multidetector CT imaging of the abdomen and pelvis was performed using the standard protocol following bolus administration of intravenous contrast. CONTRAST:  33mL ISOVUE-300 IOPAMIDOL (ISOVUE-300) INJECTION 61% COMPARISON:  None. FINDINGS: Lower chest: Lung bases demonstrate linear scarring. No acute consolidation or pleural effusion. Normal heart size. Hepatobiliary: Mild intra hepatic and moderate extrahepatic biliary dilatation with common duct measuring up to 18 mm. Surgical absence of the gallbladder. Pancreas: Unremarkable. No pancreatic ductal dilatation or surrounding inflammatory changes. Spleen: Normal in size without focal abnormality. Adrenals/Urinary Tract: Adrenal glands are within normal limits. Small cyst in the left kidney. No hydronephrosis. The bladder is normal Stomach/Bowel: The stomach is nonenlarged. No dilated small bowel. No colon wall thickening. Sigmoid colon diverticular disease without acute inflammation Vascular/Lymphatic: Aortic atherosclerosis. No enlarged abdominal or pelvic lymph nodes. Reproductive: Status post hysterectomy. No adnexal masses. Other: Negative for free air or free fluid. Musculoskeletal: Degenerative changes. 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 IMPRESSION: 1. Mild intra hepatic and moderate extrahepatic biliary dilatation, post cholecystectomy. Recommend correlation with laboratory values, follow-up MR or ERCP as indicated 2. Sigmoid colon diverticular disease without acute inflammation. Electronically Signed   By: Donavan Foil M.D.   On: 03/22/2017 21:48   Mr Abdomen Mrcp Wo Contrast  Result Date: 03/23/2017 CLINICAL DATA:  Abdominal pain with nausea and vomiting.  Biliary duct dilatation on CT. EXAM: MRI ABDOMEN WITHOUT CONTRAST  (INCLUDING MRCP) TECHNIQUE: Multiplanar multisequence MR imaging of the abdomen was performed. Heavily T2-weighted images of the biliary and pancreatic ducts were  obtained, and three-dimensional MRCP images were rendered by post processing. COMPARISON:  Yesterday CT FINDINGS: Mild to moderate motion degradation throughout. Lower chest: Mild cardiomegaly.  Small hiatal hernia. Hepatobiliary: No focal liver lesion. Mild decreased signal on inphase imaging, suggesting iron deposition in the liver. Moderate intrahepatic biliary duct dilatation. The left hepatic duct measures 10 mm on image 81/series 4. Moderate common duct dilatation, with the common duct measuring maximally 1.6 cm in the porta hepatis on image 86/ series 4. Tapers gradually distally. No choledocholithiasis. No well-defined obstructive mass. Pancreas: Pancreatic atrophy. Duct size upper normal, including at 4 mm on image 71/ series 4. No acute pancreatitis. Spleen:  Normal in size, without focal abnormality. Adrenals/Urinary Tract: Normal adrenal glands. Mild renal cortical thinning bilaterally. Upper pole 1.6 cm left renal cyst. An interpolar 6 mm T1 hyperintense left renal lesion on image 66/series 12 is likely a complex cyst, but is incompletely characterized. Stomach/Bowel: Normal remainder of the stomach and abdominal bowel loops. Vascular/Lymphatic: Aortic and branch vessel atherosclerosis. Upper normal size porta hepatis nodes are likely reactive. Multiple small retroperitoneal nodes, none of which are pathologic by size criteria. Other:  No ascites. Musculoskeletal: No acute osseous abnormality. IMPRESSION: 1. Mild to moderately motion degraded exam. 2. Biliary duct dilatation after cholecystectomy. No choledocholithiasis or well-defined obstructive mass. Given the elevated bilirubin, consider further evaluation with ERCP to exclude ampullary stricture or otherwise occult ampullary lesion. 3. Hiatal hernia. 4. 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 Electronically Signed   By: Abigail Miyamoto M.D.   On: 03/23/2017 07:52   Mr 3d Recon At Scanner  Result Date: 03/23/2017 CLINICAL DATA:  Abdominal pain with nausea  and vomiting. Biliary duct dilatation on CT. EXAM: MRI ABDOMEN WITHOUT CONTRAST  (INCLUDING MRCP) TECHNIQUE: Multiplanar multisequence MR imaging of the abdomen was performed. Heavily T2-weighted images of the biliary and pancreatic ducts were obtained, and three-dimensional MRCP images were rendered by post processing. COMPARISON:  Yesterday CT FINDINGS: Mild to moderate motion degradation throughout. Lower chest: Mild cardiomegaly.  Small hiatal hernia. Hepatobiliary: No focal liver lesion. Mild decreased signal on inphase imaging, suggesting iron deposition in the liver. Moderate intrahepatic biliary duct dilatation. The left hepatic duct measures 10 mm on image 81/series 4. Moderate common duct dilatation, with the common duct measuring maximally 1.6 cm in the porta hepatis on image 86/ series 4. Tapers gradually distally. No choledocholithiasis. No well-defined obstructive mass. Pancreas: Pancreatic atrophy. Duct size upper normal, including at 4 mm on image 71/ series 4. No acute pancreatitis. Spleen:  Normal in size, without focal abnormality. Adrenals/Urinary Tract: Normal adrenal glands. Mild renal cortical thinning bilaterally. Upper pole 1.6 cm left renal cyst. An interpolar 6 mm T1 hyperintense left renal lesion on image 66/series 12 is likely a complex cyst, but is incompletely characterized. Stomach/Bowel: Normal remainder of the stomach and abdominal bowel loops. Vascular/Lymphatic: Aortic and branch vessel atherosclerosis. Upper normal size porta hepatis nodes are likely reactive. Multiple small retroperitoneal nodes, none of which are pathologic by size criteria. Other:  No ascites. Musculoskeletal: No acute osseous abnormality. IMPRESSION: 1. Mild to moderately motion degraded exam. 2. Biliary duct dilatation after cholecystectomy. No choledocholithiasis or well-defined obstructive mass. Given the elevated bilirubin, consider further evaluation with ERCP to exclude ampullary stricture or otherwise  occult ampullary lesion. 3. Hiatal hernia. 4.  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 Electronically Signed   By: Abigail Miyamoto M.D.   On: 03/23/2017 07:52       Assessment / Plan:   71 y/o female with history of DVT on Eliquis (last dose 6 AM on 9/26), s/p cholescystectomy, who presented with labs and symptoms of cholangitis. MRCP shows dilated CBD and left intrahepatic biliary tree - no obvious mass lesions or stones noted on MRCP, although ddx includes stones, malignancy, ampullary lesion, etc. LAEs stable, WBC much improved after antibiotics. Her blood cultures are positive for klebsiella / enterobacter. She warrants ERCP however it was not done yesterday given her Eliquis use and her GFR around 40, higher risk for bleeding with sphincterotomy. Now > 2 days since her last dose of Eliquis, she is tentatively scheduled for an ERCP tentatively with Dr. Loletha Carrow at 1230. I have explained the procedure with the patient and her daughter, outlined potential risks with them to include bleeding, pancreatitis, perforation, bile duct injury, etc, and they wish to proceed.   Otherwise, patients has been tachycardic overnight to 130-140s. BP normal and WBC downtrending - unclear if this is due to her infection, or perhaps related to anxiety, she didn't sleep at all and is quite anxious about her procedure today. Will discuss with primary service. Continue antibiotics.   Call with questions.  Hickman Cellar, MD [facility] Pager [phone]

## 2017-03-24 NOTE — Progress Notes (Signed)
PHARMACY - PHYSICIAN COMMUNICATION CRITICAL VALUE ALERT - BLOOD CULTURE IDENTIFICATION (BCID)  Results for orders placed or performed during the hospital encounter of 03/22/17  Blood Culture ID Panel (Reflexed) (Collected: 03/23/2017  1:20 AM)  Result Value Ref Range   Enterococcus species NOT DETECTED NOT DETECTED   Listeria monocytogenes NOT DETECTED NOT DETECTED   Staphylococcus species NOT DETECTED NOT DETECTED   Staphylococcus aureus NOT DETECTED NOT DETECTED   Streptococcus species NOT DETECTED NOT DETECTED   Streptococcus agalactiae NOT DETECTED NOT DETECTED   Streptococcus pneumoniae NOT DETECTED NOT DETECTED   Streptococcus pyogenes NOT DETECTED NOT DETECTED   Acinetobacter baumannii NOT DETECTED NOT DETECTED   Enterobacteriaceae species DETECTED (A) NOT DETECTED   Enterobacter cloacae complex NOT DETECTED NOT DETECTED   Escherichia coli NOT DETECTED NOT DETECTED   Klebsiella oxytoca NOT DETECTED NOT DETECTED   Klebsiella pneumoniae DETECTED (A) NOT DETECTED   Proteus species NOT DETECTED NOT DETECTED   Serratia marcescens NOT DETECTED NOT DETECTED   Carbapenem resistance NOT DETECTED NOT DETECTED   Haemophilus influenzae NOT DETECTED NOT DETECTED   Neisseria meningitidis NOT DETECTED NOT DETECTED   Pseudomonas aeruginosa NOT DETECTED NOT DETECTED   Candida albicans NOT DETECTED NOT DETECTED   Candida glabrata NOT DETECTED NOT DETECTED   Candida krusei NOT DETECTED NOT DETECTED   Candida parapsilosis NOT DETECTED NOT DETECTED   Candida tropicalis NOT DETECTED NOT DETECTED    Name of physician (or Provider) Contacted: X. Blount  Changes to prescribed antibiotics required: Changed to Ceftriaxone 2gm iv q24hr per algorithm.  Peggy Armstrong 03/24/2017  3:25 AM

## 2017-03-24 NOTE — Consult Note (Signed)
Chief Complaint: Patient was seen in consultation today for percutaneous transhepatic cholangiogram with biliary drainage Chief Complaint  Patient presents with  . Abdominal Pain    Referring Physician(s): Danis,H  Supervising Physician: Corrie Mckusick  Patient Status: [facility] - In-pt  History of Present Illness: Peggy Armstrong is a 71 y.o. 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 CT scan revealed mild intrahepatic and moderate extrahepatic biliary dilatation as well as sigmoid colon diverticular disease. She was evaluated by GI service and underwent ERCP today with unsuccessful cannulation secondary to mass in second portion of the duodenum causing high-grade biliary obstruction. Lab studies also significant for blood cultures with Klebsiella and Enterobacter . Current labs include WBC 12.5, hemoglobin 10.6, platelets 171k, creatinine 1.43, PT 16.1, INR 1.3, total bilirubin 3.4. She is currently afebrile. Request now received from GI for PTC with biliary drain placement  Past Medical History:  Diagnosis Date  . Clotting disorder ([place])    right DVT  . Gout   . Gout   . Hypertension   . RA (rheumatoid arthritis) ([place])     Past Surgical History:  Procedure Laterality Date  . ABDOMINAL HYSTERECTOMY    . CHOLECYSTECTOMY    . HERNIA REPAIR      Allergies: Codeine  Medications: Prior to Admission medications   Medication Sig Start Date End Date Taking? Authorizing Provider  albuterol (PROVENTIL HFA;VENTOLIN HFA) 108 (90 Base) MCG/ACT inhaler Inhale 2 puffs into the lungs every 6 (six) hours as needed for wheezing or shortness of breath.  10/28/16 10/28/17 Yes [provider]  allopurinol (ZYLOPRIM) 300 MG tablet Take 300 mg by mouth daily.  05/06/15  Yes [provider]    ALPRAZolam (XANAX) 0.25 MG tablet Take 0.25 mg by mouth at bedtime.  07/21/15  Yes [provider]  apixaban (ELIQUIS) 2.5 MG TABS tablet Take 2.5 mg by mouth 2 (two) times daily.   Yes [provider]  atorvastatin (LIPITOR) 10 MG tablet Take 10 mg by mouth daily. 07/12/16  Yes [provider]  chlorthalidone (HYGROTON) 25 MG tablet Take 25 mg by mouth daily. 07/22/15  Yes [provider]  cholecalciferol (VITAMIN D) 1000 units tablet Take 1,000 Units by mouth daily.   Yes [provider]  cloNIDine (CATAPRES) 0.2 MG tablet Take 0.2 mg by mouth 2 (two) times daily. 07/22/15  Yes [provider]  diltiazem (CARDIZEM CD) 300 MG 24 hr capsule Take 300 mg by mouth daily. 07/22/15  Yes [provider]  lisinopril (PRINIVIL,ZESTRIL) 10 MG tablet Take 10 mg by mouth daily. 07/22/15  Yes [provider]  vitamin B-12 (CYANOCOBALAMIN) 1000 MCG tablet Take 1,000 mcg by mouth daily.   Yes [provider]     Family History  Problem Relation Age of Onset  . Cancer Father        prostate ca    Social History   Social History  . Marital status: Divorced    Spouse name: N/A  . Number of children: N/A  . Years of education: N/A   Social History Main Topics  . Smoking status: Never Smoker  . Smokeless tobacco: Never Used  . Alcohol use No  . Drug use: No  . 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   Other Topics Concern  .  None   Social History Narrative  . None      Review of Systems see above; currently denies HA,CP,cough, worsening abd/back pain,N/V or bleeding; she cont to have some dyspnea with exertion.  Vital Signs: BP 139/87   Pulse (!) 101   Temp 98.6 F (37 C) (Oral)   Resp (!) 21   Ht '5\' 1"'  (1.549 m)   Wt 245 lb (111.1 kg)   SpO2 94%   BMI 46.29 kg/m   Physical Exam awake, answers questions appropriately; chest- sl dim BS bases; heart- sl tachy but regular rhythm;  abd- obese, soft,+BS, currently NT; ext- FROM  Imaging: Ct Abdomen Pelvis W Contrast  Result Date: 03/22/2017 CLINICAL DATA:  Mid abdominal pain and right upper quadrant pain with nausea and vomiting EXAM: CT ABDOMEN AND PELVIS WITH CONTRAST TECHNIQUE: Multidetector CT imaging of the abdomen and pelvis was performed using the standard protocol following bolus administration of intravenous contrast. CONTRAST:  62m ISOVUE-300 IOPAMIDOL (ISOVUE-300) INJECTION 61% COMPARISON:  None. FINDINGS: Lower chest: Lung bases demonstrate linear scarring. No acute consolidation or pleural effusion. Normal heart size. Hepatobiliary: Mild intra hepatic and moderate extrahepatic biliary dilatation with common duct measuring up to 18 mm. Surgical absence of the gallbladder. Pancreas: Unremarkable. No pancreatic ductal dilatation or surrounding inflammatory changes. Spleen: Normal in size without focal abnormality. Adrenals/Urinary Tract: Adrenal glands are within normal limits. Small cyst in the left kidney. No hydronephrosis. The bladder is normal Stomach/Bowel: The stomach is nonenlarged. No dilated small bowel. No colon wall thickening. Sigmoid colon diverticular disease without acute inflammation Vascular/Lymphatic: Aortic atherosclerosis. No enlarged abdominal or pelvic lymph nodes. Reproductive: Status post hysterectomy. No adnexal masses. Other: Negative for free air or free fluid. Musculoskeletal: Degenerative changes. 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 IMPRESSION: 1. Mild intra hepatic and moderate extrahepatic biliary dilatation, post cholecystectomy. Recommend correlation with laboratory values, follow-up MR or ERCP as indicated 2. Sigmoid colon diverticular disease without acute inflammation. Electronically Signed   By: KDonavan FoilM.D.   On: 03/22/2017 21:48   Mr Abdomen Mrcp Wo Contrast  Result Date: 03/23/2017 CLINICAL DATA:  Abdominal pain with nausea and vomiting. Biliary duct dilatation on CT. EXAM:  MRI ABDOMEN WITHOUT CONTRAST  (INCLUDING MRCP) TECHNIQUE: Multiplanar multisequence MR imaging of the abdomen was performed. Heavily T2-weighted images of the biliary and pancreatic ducts were obtained, and three-dimensional MRCP images were rendered by post processing. COMPARISON:  Yesterday CT FINDINGS: Mild to moderate motion degradation throughout. Lower chest: Mild cardiomegaly.  Small hiatal hernia. Hepatobiliary: No focal liver lesion. Mild decreased signal on inphase imaging, suggesting iron deposition in the liver. Moderate intrahepatic biliary duct dilatation. The left hepatic duct measures 10 mm on image 81/series 4. Moderate common duct dilatation, with the common duct measuring maximally 1.6 cm in the porta hepatis on image 86/ series 4. Tapers gradually distally. No choledocholithiasis. No well-defined obstructive mass. Pancreas: Pancreatic atrophy. Duct size upper normal, including at 4 mm on image 71/ series 4. No acute pancreatitis. Spleen:  Normal in size, without focal abnormality. Adrenals/Urinary Tract: Normal adrenal glands. Mild renal cortical thinning bilaterally. Upper pole 1.6 cm left renal cyst. An interpolar 6 mm T1 hyperintense left renal lesion on image 66/series 12 is likely a complex cyst, but is incompletely characterized. Stomach/Bowel: Normal remainder of the stomach and abdominal bowel loops. Vascular/Lymphatic: Aortic and branch vessel atherosclerosis. Upper normal size porta hepatis nodes are likely reactive. Multiple small retroperitoneal nodes, none of which are pathologic by size criteria. Other:  No ascites. Musculoskeletal: No acute osseous abnormality. IMPRESSION: 1. Mild to moderately motion degraded exam. 2. Biliary duct dilatation after cholecystectomy. No choledocholithiasis or well-defined obstructive mass. Given the elevated bilirubin, consider further evaluation with ERCP to exclude ampullary stricture or otherwise occult ampullary lesion. 3. Hiatal hernia. 4. Iron  deposition in the liver. Electronically Signed   By: Abigail Miyamoto M.D.   On: 03/23/2017 07:52   Mr 3d Recon At Scanner  Result Date: 03/23/2017 CLINICAL DATA:  Abdominal pain with nausea and vomiting. Biliary duct dilatation on CT. EXAM: MRI ABDOMEN WITHOUT CONTRAST  (INCLUDING MRCP) TECHNIQUE: Multiplanar multisequence MR imaging of the abdomen was performed. Heavily T2-weighted images of the biliary and pancreatic ducts were obtained, and three-dimensional MRCP images were rendered by post processing. COMPARISON:  Yesterday CT FINDINGS: Mild to moderate motion degradation throughout. Lower chest: Mild cardiomegaly.  Small hiatal hernia. Hepatobiliary: No focal liver lesion. Mild decreased signal on inphase imaging, suggesting iron deposition in the liver. Moderate intrahepatic biliary duct dilatation. The left hepatic duct measures 10 mm on image 81/series 4. Moderate common duct dilatation, with the common duct measuring maximally 1.6 cm in the porta hepatis on image 86/ series 4. Tapers gradually distally. No choledocholithiasis. No well-defined obstructive mass. Pancreas: Pancreatic atrophy. Duct size upper normal, including at 4 mm on image 71/ series 4. No acute pancreatitis. Spleen:  Normal in size, without focal abnormality. Adrenals/Urinary Tract: Normal adrenal glands. Mild renal cortical thinning bilaterally. Upper pole 1.6 cm left renal cyst. An interpolar 6 mm T1 hyperintense left renal lesion on image 66/series 12 is likely a complex cyst, but is incompletely characterized. Stomach/Bowel: Normal remainder of the stomach and abdominal bowel loops. Vascular/Lymphatic: Aortic and branch vessel atherosclerosis. Upper normal size porta hepatis nodes are likely reactive. Multiple small retroperitoneal nodes, none of which are pathologic by size criteria. Other:  No ascites. Musculoskeletal: No acute osseous abnormality. IMPRESSION: 1. Mild to moderately motion degraded exam. 2. Biliary duct dilatation  after cholecystectomy. No choledocholithiasis or well-defined obstructive mass. Given the elevated bilirubin, consider further evaluation with ERCP to exclude ampullary stricture or otherwise occult ampullary lesion. 3. Hiatal hernia. 4. 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 Electronically Signed   By: Abigail Miyamoto M.D.   On: 03/23/2017 07:52    Labs:  CBC:  Recent Labs  03/10/17 0928 03/22/17 1712 03/23/17 0236 03/24/17 0544  WBC 7.4 16.9* 24.2* 12.5*  HGB 10.0* 10.9* 10.2* 10.6*  HCT 32.5* 34.9* 32.5* 34.1*  PLT 251 262 210 171    COAGS:  Recent Labs  03/23/17 0121 03/24/17 0717  INR 1.52 1.30  APTT 39*  --     BMP:  Recent Labs  08/11/16 0936 03/22/17 1530 03/23/17 0236 03/24/17 0544 03/24/17 1131  NA 142 139 138 139  --   K 3.4* 3.0* 3.3* 2.9* 3.4*  CL  --  101 102 103  --   CO2 '25 26 24 25  ' --   GLUCOSE 119 153* 118* 114*  --   BUN 23.3 28* 24* 18  --   CALCIUM 9.6 9.3 8.6* 8.4*  --   CREATININE 1.3* 1.39* 1.48* 1.43*  --   GFRNONAA  --  37* 34* 36*  --   GFRAA  --  43* 40* 42*  --     LIVER FUNCTION TESTS:  Recent Labs  08/11/16 0936 03/22/17 1530 03/23/17 0236 03/24/17 0544  BILITOT 0.36 1.8* 3.2* 3.4*  AST 16 396* 318* 139*  ALT 18 215* 232* 148*  ALKPHOS 121 318* 302* 267*  PROT 6.8 7.3 6.4* 6.4*  ALBUMIN 3.3* 3.5 3.1* 3.0*    TUMOR MARKERS: No results for input(s): AFPTM, CEA, CA199, CHROMGRNA in the last 8760 hours.  Assessment and Plan: 71 y.o. 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 CT scan revealed mild intrahepatic and moderate extrahepatic biliary dilatation as well as sigmoid colon diverticular disease. She was evaluated by GI service and underwent ERCP today with unsuccessful cannulation secondary to mass in second portion of the duodenum causing  high-grade biliary obstruction. Lab studies also significant for blood cultures with Klebsiella and Enterobacter . Current labs include WBC 12.5, hemoglobin 10.6, platelets 171k, creatinine 1.43, PT 16.1, INR 1.3, total bilirubin 3.4. She is currently afebrile. Request now received from GI for PTC with biliary drain placement. Case and imaging studies have been reviewed by Dr. Earleen Newport. Details/risks of procedure, including but not limited to, internal bleeding, infection, injury to adjacent structures, need for prolonged drainage discussed with patient and family with their understanding and consent. Procedure scheduled for this afternoon.   Thank you for this interesting consult.  I greatly enjoyed meeting ARIAL GALLIGAN and look forward to participating in their care.  A copy of this report was sent to the requesting provider on this date.  Electronically Signed: D. Rowe Robert, PA-C 03/24/2017, 3:21 PM   I spent a total of 40 minutes  in face to face in clinical consultation, greater than 50% of which was counseling/coordinating care for percutaneous transhepatic cholangiogram with biliary drain

## 2017-03-24 NOTE — Progress Notes (Signed)
Patient ID: PIERRE CUMPTON, female   DOB: [birth year], 71 y.o.   MRN: [id number]    PROGRESS NOTE  GWENN TEODORO  WUJ:[id number] DOB: [birth year] DOA: 03/22/2017  PCP: Thurman Coyer, MD   Brief Narrative:  71 y.o. female with history of DVT, rheumatoid arthritis, hypertension presented to the ER with epigastric pain associated with nausea and non bloody vomiting, poor oral intake. CT abd in ED notable for mild intra and extrahepatic biliary ductal dilation. GI consulted.  Assessment & Plan:   Principal Problem:   SIRS (systemic inflammatory response syndrome) (HCC), transaminitis, cholangitis  - secondary to likely cholangitis - MRCP notable for dilated CBD and left intrahepatic biliary tree - LFT's still elevated although improving and WBC is trending down - pt is currently on ceftriaxone and it is day #3 of antibiotics. Blood cultures positive for GNR - GI consulted, plans on ERCP today. Eliquis on hold - will provide analgesia and antiemetics as needed - appreciate GI team following  - will repeat CMET and CBC in AM  Active Problems:   Right leg DVT (HCC) - Eliquis has been on hold in an anticipation of an intervention     Acute kidney injury - appears to be pre renal in etiology in the setting of acute illness - keep on IVF - BMP in AM    Hypokalemia - supplement, check Mg - BMP in AM    RA (rheumatoid arthritis) ([place]) - outpatient follow up     Iron deficiency anemia - pt reports intermittent bleeding in the past requiring transfusions - Hg overall stable, will repeat CBC in AM    Morbid obesity  - Body mass index is 46.24 kg/m  Sinus Tachycardia -likely multifactorial -heart rate in 140-150s overnight -she is not in any distress, no shortness of breath or chest pain - blood pressure is elevated -restart home dose of dilitiazem -treat anxiety that may be contributing -continue to follow  DVT prophylaxis: Eliquis has been on hold  Code Status: Full    Family Communication: discussed with daughter at the bedside  Disposition Plan: to be determined   Consultants:   GI  Procedures:   MRCP 9/27 - results below   Antimicrobials:   Zosyn 9/26 -->9/27  Ceftriaxone 9/27>  Subjective: Feels a little anxious. No abdominal pain. No chest pain. She is short of breath on exertion  Objective: Vitals:   03/23/17 0618 03/23/17 1317 03/23/17 2113 03/24/17 0555  BP:  130/75 (!) 145/87 (!) 145/95  Pulse:  81 (!) 106   Resp:  20 20 (!) 22  Temp:  98.2 F (36.8 C) 99 F (37.2 C) 98.4 F (36.9 C)  TempSrc:  Oral Oral Oral  SpO2:  95% 93% 91%  Weight: 111 kg (244 lb 11.4 oz)   111.5 kg (245 lb 14.4 oz)  Height: 5\' 1"  (1.549 m)       Intake/Output Summary (Last 24 hours) at 03/24/17 0911 Last data filed at 03/24/17 0835  Gross per 24 hour  Intake              740 ml  Output                0 ml  Net              740 ml   Filed Weights   03/23/17 0618 03/24/17 0555  Weight: 111 kg (244 lb 11.4 oz) 111.5 kg (245 lb 14.4 oz)    Examination:  General  exam: Appears calm and comfortable  Respiratory system: Clear to auscultation. Respiratory effort normal. Cardiovascular system: S1 & S2 heard, tachycardic. No JVD, rubs, gallops or clicks. No pedal edema. Gastrointestinal system: Abdomen is soft and nontender. No organomegaly or masses felt.  Central nervous system: Alert and oriented. No focal neurological deficits. Extremities: Symmetric 5 x 5 power. Skin: No rashes, lesions or ulcers Psychiatry: Judgement and insight appear normal. Mood & affect appropriate.    Data Reviewed: I have personally reviewed following labs and imaging studies  CBC:  Recent Labs Lab 03/22/17 1712 03/23/17 0236 03/24/17 0544  WBC 16.9* 24.2* 12.5*  NEUTROABS 16.2* 22.1*  --   HGB 10.9* 10.2* 10.6*  HCT 34.9* 32.5* 34.1*  MCV 82.3 83.1 82.0  PLT 262 210 413   Basic Metabolic Panel:  Recent Labs Lab 03/22/17 1530 03/23/17 0236  03/24/17 0544  NA 139 138 139  K 3.0* 3.3* 2.9*  CL 101 102 103  CO2 26 24 25   GLUCOSE 153* 118* 114*  BUN 28* 24* 18  CREATININE 1.39* 1.48* 1.43*  CALCIUM 9.3 8.6* 8.4*   Liver Function Tests:  Recent Labs Lab 03/22/17 1530 03/23/17 0236 03/24/17 0544  AST 396* 318* 139*  ALT 215* 232* 148*  ALKPHOS 318* 302* 267*  BILITOT 1.8* 3.2* 3.4*  PROT 7.3 6.4* 6.4*  ALBUMIN 3.5 3.1* 3.0*    Recent Labs Lab 03/22/17 1530  LIPASE 37   Coagulation Profile:  Recent Labs Lab 03/23/17 0121 03/24/17 0717  INR 1.52 1.30   CBG:  Recent Labs Lab 03/23/17 0538 03/23/17 1137 03/23/17 1819 03/24/17 0007 03/24/17 0549  GLUCAP 116* 99 88 136* 117*   Urine analysis:    Component Value Date/Time   COLORURINE AMBER (A) 03/22/2017 1530   APPEARANCEUR HAZY (A) 03/22/2017 1530   LABSPEC 1.017 03/22/2017 1530   PHURINE 5.0 03/22/2017 1530   GLUCOSEU NEGATIVE 03/22/2017 1530   HGBUR NEGATIVE 03/22/2017 1530   BILIRUBINUR NEGATIVE 03/22/2017 1530   [place] 03/22/2017 1530   PROTEINUR NEGATIVE 03/22/2017 1530   NITRITE NEGATIVE 03/22/2017 1530   [place] 03/22/2017 1530   Radiology Studies: Ct Abdomen Pelvis W Contrast  Result Date: 03/22/2017 CLINICAL DATA:  Mid abdominal pain and right upper quadrant pain with nausea and vomiting EXAM: CT ABDOMEN AND PELVIS WITH CONTRAST TECHNIQUE: Multidetector CT imaging of the abdomen and pelvis was performed using the standard protocol following bolus administration of intravenous contrast. CONTRAST:  39mL ISOVUE-300 IOPAMIDOL (ISOVUE-300) INJECTION 61% COMPARISON:  None. FINDINGS: Lower chest: Lung bases demonstrate linear scarring. No acute consolidation or pleural effusion. Normal heart size. Hepatobiliary: Mild intra hepatic and moderate extrahepatic biliary dilatation with common duct measuring up to 18 mm. Surgical absence of the gallbladder. Pancreas: Unremarkable. No pancreatic ductal dilatation or  surrounding inflammatory changes. Spleen: Normal in size without focal abnormality. Adrenals/Urinary Tract: Adrenal glands are within normal limits. Small cyst in the left kidney. No hydronephrosis. The bladder is normal Stomach/Bowel: The stomach is nonenlarged. No dilated small bowel. No colon wall thickening. Sigmoid colon diverticular disease without acute inflammation Vascular/Lymphatic: Aortic atherosclerosis. No enlarged abdominal or pelvic lymph nodes. Reproductive: Status post hysterectomy. No adnexal masses. Other: Negative for free air or free fluid. Musculoskeletal: Degenerative changes. 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 IMPRESSION: 1. Mild intra hepatic and moderate extrahepatic biliary dilatation, post cholecystectomy. Recommend correlation with laboratory values, follow-up MR or ERCP as indicated 2. Sigmoid colon diverticular disease without acute inflammation. Electronically Signed   By: Maudie Mercury  Francoise Ceo M.D.   On: 03/22/2017 21:48   Mr Abdomen Mrcp Wo Contrast  Result Date: 03/23/2017 CLINICAL DATA:  Abdominal pain with nausea and vomiting. Biliary duct dilatation on CT. EXAM: MRI ABDOMEN WITHOUT CONTRAST  (INCLUDING MRCP) TECHNIQUE: Multiplanar multisequence MR imaging of the abdomen was performed. Heavily T2-weighted images of the biliary and pancreatic ducts were obtained, and three-dimensional MRCP images were rendered by post processing. COMPARISON:  Yesterday CT FINDINGS: Mild to moderate motion degradation throughout. Lower chest: Mild cardiomegaly.  Small hiatal hernia. Hepatobiliary: No focal liver lesion. Mild decreased signal on inphase imaging, suggesting iron deposition in the liver. Moderate intrahepatic biliary duct dilatation. The left hepatic duct measures 10 mm on image 81/series 4. Moderate common duct dilatation, with the common duct measuring maximally 1.6 cm in the porta hepatis on image 86/ series 4. Tapers gradually distally. No choledocholithiasis. No well-defined  obstructive mass. Pancreas: Pancreatic atrophy. Duct size upper normal, including at 4 mm on image 71/ series 4. No acute pancreatitis. Spleen:  Normal in size, without focal abnormality. Adrenals/Urinary Tract: Normal adrenal glands. Mild renal cortical thinning bilaterally. Upper pole 1.6 cm left renal cyst. An interpolar 6 mm T1 hyperintense left renal lesion on image 66/series 12 is likely a complex cyst, but is incompletely characterized. Stomach/Bowel: Normal remainder of the stomach and abdominal bowel loops. Vascular/Lymphatic: Aortic and branch vessel atherosclerosis. Upper normal size porta hepatis nodes are likely reactive. Multiple small retroperitoneal nodes, none of which are pathologic by size criteria. Other:  No ascites. Musculoskeletal: No acute osseous abnormality. IMPRESSION: 1. Mild to moderately motion degraded exam. 2. Biliary duct dilatation after cholecystectomy. No choledocholithiasis or well-defined obstructive mass. Given the elevated bilirubin, consider further evaluation with ERCP to exclude ampullary stricture or otherwise occult ampullary lesion. 3. Hiatal hernia. 4. 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 Electronically Signed   By: Abigail Miyamoto M.D.   On: 03/23/2017 07:52   Mr 3d Recon At Scanner  Result Date: 03/23/2017 CLINICAL DATA:  Abdominal pain with nausea and vomiting. Biliary duct dilatation on CT. EXAM: MRI ABDOMEN WITHOUT CONTRAST  (INCLUDING MRCP) TECHNIQUE: Multiplanar multisequence MR imaging of the abdomen was performed. Heavily T2-weighted images of the biliary and pancreatic ducts were obtained, and three-dimensional MRCP images were rendered by post processing. COMPARISON:  Yesterday CT FINDINGS: Mild to moderate motion degradation throughout. Lower chest: Mild cardiomegaly.  Small hiatal hernia. Hepatobiliary: No focal liver lesion. Mild decreased signal on inphase imaging, suggesting iron deposition in the liver. Moderate intrahepatic biliary duct dilatation. The left  hepatic duct measures 10 mm on image 81/series 4. Moderate common duct dilatation, with the common duct measuring maximally 1.6 cm in the porta hepatis on image 86/ series 4. Tapers gradually distally. No choledocholithiasis. No well-defined obstructive mass. Pancreas: Pancreatic atrophy. Duct size upper normal, including at 4 mm on image 71/ series 4. No acute pancreatitis. Spleen:  Normal in size, without focal abnormality. Adrenals/Urinary Tract: Normal adrenal glands. Mild renal cortical thinning bilaterally. Upper pole 1.6 cm left renal cyst. An interpolar 6 mm T1 hyperintense left renal lesion on image 66/series 12 is likely a complex cyst, but is incompletely characterized. Stomach/Bowel: Normal remainder of the stomach and abdominal bowel loops. Vascular/Lymphatic: Aortic and branch vessel atherosclerosis. Upper normal size porta hepatis nodes are likely reactive. Multiple small retroperitoneal nodes, none of which are pathologic by size criteria. Other:  No ascites. Musculoskeletal: No acute osseous abnormality. IMPRESSION: 1. Mild to moderately motion degraded exam. 2. Biliary duct dilatation after cholecystectomy. No choledocholithiasis  or well-defined obstructive mass. Given the elevated bilirubin, consider further evaluation with ERCP to exclude ampullary stricture or otherwise occult ampullary lesion. 3. Hiatal hernia. 4. 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 Electronically Signed   By: Abigail Miyamoto M.D.   On: 03/23/2017 07:52   Scheduled Meds: . allopurinol  300 mg Oral Daily  . diltiazem  300 mg Oral Daily  . diltiazem  10 mg Intravenous Once  . indomethacin  50 mg Rectal Once  . Influenza vac split quadrivalent PF  0.5 mL Intramuscular Tomorrow-1000  . LORazepam  0.5 mg Intravenous Once   Continuous Infusions: . cefTRIAXone (ROCEPHIN)  IV Stopped (03/24/17 0132)  . potassium chloride    . sodium chloride 0.9 % 1,000 mL with potassium chloride 20 mEq infusion      LOS: 2 days   Time spent:  35 minutes   Duc Crocket, MD [facility] Pager [phone]  If 7PM-7AM, please contact night-coverage [email or web address] Password [email or web address] 03/24/2017, 9:11 AM

## 2017-03-24 NOTE — Interval H&P Note (Signed)
History and Physical Interval Note:  03/24/2017 12:37 PM  Peggy Armstrong  has presented today for surgery, with the diagnosis of cholangitis, dilated CBD  The various methods of treatment have been discussed with the patient and family. After consideration of risks, benefits and other options for treatment, the patient has consented to  Procedure(s): ENDOSCOPIC RETROGRADE CHOLANGIOPANCREATOGRAPHY (ERCP) (N/A) as a surgical intervention .  The patient's history has been reviewed, patient examined.  Potassium 2.9 earlier this AM, 2 doses of 10 meQ have been given, IVF with potassium running.  Repeat potassium level has been sent and currently awaiting anesthesiologist evaluation.  She is anxious-appearing. I have reviewed the patient's chart and labs.  Questions were answered to the patient's satisfaction.     [name]

## 2017-03-24 NOTE — Transfer of Care (Signed)
Immediate Anesthesia Transfer of Care Note  Patient: Peggy Armstrong  Procedure(s) Performed: Procedure(s): ESOPHAGOGASTRODUODENOSCOPY (EGD) WITH PROPOFOL (N/A)  Patient Location: PACU and Endoscopy Unit  Anesthesia Type:General  Level of Consciousness: awake and patient cooperative  Airway & Oxygen Therapy: Patient Spontanous Breathing and Patient connected to face mask oxygen  Post-op Assessment: Report given to RN and Post -op Vital signs reviewed and stable  Post vital signs: Reviewed and stable  Last Vitals:  Vitals:   03/24/17 1211 03/24/17 1400  BP: (!) 136/99 (!) 137/98  Pulse: (!) 103 (!) 105  Resp: (!) 22 (!) 29  Temp: 37.1 C   SpO2: 93% 99%    Last Pain:  Vitals:   03/24/17 1400  TempSrc: Oral  PainSc:       Patients Stated Pain Goal: 0 (67/70/34 0352)  Complications: No apparent anesthesia complications

## 2017-03-24 NOTE — Progress Notes (Signed)
Progress Note   Subjective  Patient reports feeling okay this AM. No abdominal pain. Her main complaint is her mouth is sore, gums bothering her. Otherwise has had some sinus tachycardia overnight, BP normal. She reports feeling quite anxious about ERCP.   Objective   Vital signs in last 24 hours: Temp:  [98.2 F (36.8 C)-99 F (37.2 C)] 98.4 F (36.9 C) (09/28 0555) Pulse Rate:  [81-106] 106 (09/27 2113) Resp:  [20-22] 22 (09/28 0555) BP: (130-145)/(75-95) 145/95 (09/28 0555) SpO2:  [91 %-95 %] 91 % (09/28 0555) Weight:  [245 lb 14.4 oz (111.5 kg)] 245 lb 14.4 oz (111.5 kg) (09/28 0555) Last BM Date: 03/22/17 General:    white female in NAD Heart:  tachycardic Lungs: Respirations even and unlabored Abdomen:  Soft, nontender, protuberant Extremities:  Without edema. Neurologic:  Alert and oriented,  grossly normal neurologically. Psych:  Cooperative. Normal mood and affect.  Intake/Output from previous day: 09/27 0701 - 09/28 0700 In: 740 [P.O.:240; I.V.:500] Out: -  Intake/Output this shift: No intake/output data recorded.  Lab Results:  Recent Labs  03/22/17 1712 03/23/17 0236 03/24/17 0544  WBC 16.9* 24.2* 12.5*  HGB 10.9* 10.2* 10.6*  HCT 34.9* 32.5* 34.1*  PLT 262 210 171   BMET  Recent Labs  03/22/17 1530 03/23/17 0236 03/24/17 0544  NA 139 138 139  K 3.0* 3.3* 2.9*  CL 101 102 103  CO2 26 24 25   GLUCOSE 153* 118* 114*  BUN 28* 24* 18  CREATININE 1.39* 1.48* 1.43*  CALCIUM 9.3 8.6* 8.4*   LFT  Recent Labs  03/23/17 0236 03/24/17 0544  PROT 6.4* 6.4*  ALBUMIN 3.1* 3.0*  AST 318* 139*  ALT 232* 148*  ALKPHOS 302* 267*  BILITOT 3.2* 3.4*  BILIDIR 2.0*  --   IBILI 1.2*  --    PT/INR  Recent Labs  03/23/17 0121 03/24/17 0717  LABPROT 18.2* 16.1*  INR 1.52 1.30    Studies/Results: Ct Abdomen Pelvis W Contrast  Result Date: 03/22/2017 CLINICAL DATA:  Mid abdominal pain and right upper quadrant pain with nausea and  vomiting EXAM: CT ABDOMEN AND PELVIS WITH CONTRAST TECHNIQUE: Multidetector CT imaging of the abdomen and pelvis was performed using the standard protocol following bolus administration of intravenous contrast. CONTRAST:  68mL ISOVUE-300 IOPAMIDOL (ISOVUE-300) INJECTION 61% COMPARISON:  None. FINDINGS: Lower chest: Lung bases demonstrate linear scarring. No acute consolidation or pleural effusion. Normal heart size. Hepatobiliary: Mild intra hepatic and moderate extrahepatic biliary dilatation with common duct measuring up to 18 mm. Surgical absence of the gallbladder. Pancreas: Unremarkable. No pancreatic ductal dilatation or surrounding inflammatory changes. Spleen: Normal in size without focal abnormality. Adrenals/Urinary Tract: Adrenal glands are within normal limits. Small cyst in the left kidney. No hydronephrosis. The bladder is normal Stomach/Bowel: The stomach is nonenlarged. No dilated small bowel. No colon wall thickening. Sigmoid colon diverticular disease without acute inflammation Vascular/Lymphatic: Aortic atherosclerosis. No enlarged abdominal or pelvic lymph nodes. Reproductive: Status post hysterectomy. No adnexal masses. Other: Negative for free air or free fluid. Musculoskeletal: Degenerative changes. 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 IMPRESSION: 1. Mild intra hepatic and moderate extrahepatic biliary dilatation, post cholecystectomy. Recommend correlation with laboratory values, follow-up MR or ERCP as indicated 2. Sigmoid colon diverticular disease without acute inflammation. Electronically Signed   By: Donavan Foil M.D.   On: 03/22/2017 21:48   Mr Abdomen Mrcp Wo Contrast  Result Date: 03/23/2017 CLINICAL DATA:  Abdominal pain with nausea and vomiting.  Biliary duct dilatation on CT. EXAM: MRI ABDOMEN WITHOUT CONTRAST  (INCLUDING MRCP) TECHNIQUE: Multiplanar multisequence MR imaging of the abdomen was performed. Heavily T2-weighted images of the biliary and pancreatic ducts were  obtained, and three-dimensional MRCP images were rendered by post processing. COMPARISON:  Yesterday CT FINDINGS: Mild to moderate motion degradation throughout. Lower chest: Mild cardiomegaly.  Small hiatal hernia. Hepatobiliary: No focal liver lesion. Mild decreased signal on inphase imaging, suggesting iron deposition in the liver. Moderate intrahepatic biliary duct dilatation. The left hepatic duct measures 10 mm on image 81/series 4. Moderate common duct dilatation, with the common duct measuring maximally 1.6 cm in the porta hepatis on image 86/ series 4. Tapers gradually distally. No choledocholithiasis. No well-defined obstructive mass. Pancreas: Pancreatic atrophy. Duct size upper normal, including at 4 mm on image 71/ series 4. No acute pancreatitis. Spleen:  Normal in size, without focal abnormality. Adrenals/Urinary Tract: Normal adrenal glands. Mild renal cortical thinning bilaterally. Upper pole 1.6 cm left renal cyst. An interpolar 6 mm T1 hyperintense left renal lesion on image 66/series 12 is likely a complex cyst, but is incompletely characterized. Stomach/Bowel: Normal remainder of the stomach and abdominal bowel loops. Vascular/Lymphatic: Aortic and branch vessel atherosclerosis. Upper normal size porta hepatis nodes are likely reactive. Multiple small retroperitoneal nodes, none of which are pathologic by size criteria. Other:  No ascites. Musculoskeletal: No acute osseous abnormality. IMPRESSION: 1. Mild to moderately motion degraded exam. 2. Biliary duct dilatation after cholecystectomy. No choledocholithiasis or well-defined obstructive mass. Given the elevated bilirubin, consider further evaluation with ERCP to exclude ampullary stricture or otherwise occult ampullary lesion. 3. Hiatal hernia. 4. 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 Electronically Signed   By: Abigail Miyamoto M.D.   On: 03/23/2017 07:52   Mr 3d Recon At Scanner  Result Date: 03/23/2017 CLINICAL DATA:  Abdominal pain with nausea  and vomiting. Biliary duct dilatation on CT. EXAM: MRI ABDOMEN WITHOUT CONTRAST  (INCLUDING MRCP) TECHNIQUE: Multiplanar multisequence MR imaging of the abdomen was performed. Heavily T2-weighted images of the biliary and pancreatic ducts were obtained, and three-dimensional MRCP images were rendered by post processing. COMPARISON:  Yesterday CT FINDINGS: Mild to moderate motion degradation throughout. Lower chest: Mild cardiomegaly.  Small hiatal hernia. Hepatobiliary: No focal liver lesion. Mild decreased signal on inphase imaging, suggesting iron deposition in the liver. Moderate intrahepatic biliary duct dilatation. The left hepatic duct measures 10 mm on image 81/series 4. Moderate common duct dilatation, with the common duct measuring maximally 1.6 cm in the porta hepatis on image 86/ series 4. Tapers gradually distally. No choledocholithiasis. No well-defined obstructive mass. Pancreas: Pancreatic atrophy. Duct size upper normal, including at 4 mm on image 71/ series 4. No acute pancreatitis. Spleen:  Normal in size, without focal abnormality. Adrenals/Urinary Tract: Normal adrenal glands. Mild renal cortical thinning bilaterally. Upper pole 1.6 cm left renal cyst. An interpolar 6 mm T1 hyperintense left renal lesion on image 66/series 12 is likely a complex cyst, but is incompletely characterized. Stomach/Bowel: Normal remainder of the stomach and abdominal bowel loops. Vascular/Lymphatic: Aortic and branch vessel atherosclerosis. Upper normal size porta hepatis nodes are likely reactive. Multiple small retroperitoneal nodes, none of which are pathologic by size criteria. Other:  No ascites. Musculoskeletal: No acute osseous abnormality. IMPRESSION: 1. Mild to moderately motion degraded exam. 2. Biliary duct dilatation after cholecystectomy. No choledocholithiasis or well-defined obstructive mass. Given the elevated bilirubin, consider further evaluation with ERCP to exclude ampullary stricture or otherwise  occult ampullary lesion. 3. Hiatal hernia. 4.  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 Electronically Signed   By: Abigail Miyamoto M.D.   On: 03/23/2017 07:52       Assessment / Plan:   71 y/o female with history of DVT on Eliquis (last dose 6 AM on 9/26), s/p cholescystectomy, who presented with labs and symptoms of cholangitis. MRCP shows dilated CBD and left intrahepatic biliary tree - no obvious mass lesions or stones noted on MRCP, although ddx includes stones, malignancy, ampullary lesion, etc. LAEs stable, WBC much improved after antibiotics. Her blood cultures are positive for klebsiella / enterobacter. She warrants ERCP however it was not done yesterday given her Eliquis use and her GFR around 40, higher risk for bleeding with sphincterotomy. Now > 2 days since her last dose of Eliquis, she is tentatively scheduled for an ERCP tentatively with Dr. Loletha Carrow at 1230. I have explained the procedure with the patient and her daughter, outlined potential risks with them to include bleeding, pancreatitis, perforation, bile duct injury, etc, and they wish to proceed.   Otherwise, patients has been tachycardic overnight to 130-140s. BP normal and WBC downtrending - unclear if this is due to her infection, or perhaps related to anxiety, she didn't sleep at all and is quite anxious about her procedure today. Will discuss with primary service. Continue antibiotics.   Call with questions.  Auglaize Cellar, MD [facility] Pager [phone]

## 2017-03-24 NOTE — Op Note (Signed)
[facility] Patient Name: Peggy Armstrong Procedure Date: 03/24/2017 MRN: [id number] Attending MD: Estill Cotta. Loletha Carrow , MD Date of Birth: 1945/08/16 CSN: [id number] Age: 71 Admit Type: Inpatient Procedure:                ERCP was intended, but findings change this to an                            upper endoscopy with biopsy Indications:              Ascending cholangitis, Jaundice, Klebsiella                            bacteremia Providers:                Mallie Mussel L. Loletha Carrow, MD, Burtis Junes, RN, Cherylynn Ridges,                            Technician, Dione Booze, CRNA Referring MD:              Medicines:                General Anesthesia Complications:            No immediate complications. Estimated Blood Loss:     Estimated blood loss was minimal. Procedure:                Pre-Anesthesia Assessment:                           - Prior to the procedure, a History and Physical                            was performed, and patient medications and                            allergies were reviewed. The patient's tolerance of                            previous anesthesia was also reviewed. The risks                            and benefits of the procedure and the sedation                            options and risks were discussed with the patient.                            All questions were answered, and informed consent                            was obtained. Prior Anticoagulants: The patient has                            taken Eliquis (apixaban), last dose was 2 days  prior to procedure. ASA Grade Assessment: IV - A                            patient with severe systemic disease that is a                            constant threat to life. After reviewing the risks                            and benefits, the patient was deemed in                            satisfactory condition to undergo the procedure.                           After  obtaining informed consent, the scope was                            passed under direct vision. Throughout the                            procedure, the patient's blood pressure, pulse, and                            oxygen saturations were monitored continuously. The                            KX-[id number] ([id number]) scope was introduced through                            the mouth, The procedure was aborted. The scope was                            not inserted. bile ducts Medications were duodenum.                            The EG-[id number] ([id number]) scope was introduced through                            the and used to inject contrast into. The ERCP was                            accomplished without difficulty. The patient                            tolerated the procedure well. Scope In: Scope Out: Findings:      The esophagus was normal other than a small hiatal hernia.      The stomach was normal in its entirety, including on retroflexion.      A large polypoid and ulcerated mass with no bleeding was found in the       second portion of the duodenum. Part of its surface was ulcerated and       with a few small red spots,  but no active bleeding. It was polypoid and       on an apparent broad base arising from the expected location of the       major papilla. Unfortunately, the major papilla could not be located       with the duodenoscope. That scope was removed, and the regular upper       endoscopy scope was passed to the second portion of the duodenum. That       scope was able to get past this lesion, so it is only partially       obstructing. The major papilla still could not be identified with the       scope.      The polypoid mass was biopsied with a cold forceps for histology. 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                           - Interventional radiology will be consulted for a                            percutaneous cholangiogram and external drain to be                            placed today.                           Continue current antibiotics                           A liquid diet Procedure Code(s):        --- Professional ---                           [phone], Esophagogastroduodenoscopy, flexible,                            transoral; with biopsy, single or multiple Diagnosis Code(s):        --- Professional ---                           K83.0, Cholangitis                           R17, Unspecified jaundice CPT copyright 2016 [employer]. All rights reserved. The codes documented in this report are preliminary and upon coder review may  be revised to meet current compliance requirements. Keaundra Stehle L. Loletha Carrow, MD 03/24/2017 1:59:39 PM This report has been signed electronically. Number of Addenda: 0

## 2017-03-25 ENCOUNTER — Encounter (HOSPITAL_COMMUNITY): Payer: Self-pay | Admitting: Interventional Radiology

## 2017-03-25 DIAGNOSIS — R1011 Right upper quadrant pain: Secondary | ICD-10-CM

## 2017-03-25 DIAGNOSIS — R945 Abnormal results of liver function studies: Secondary | ICD-10-CM

## 2017-03-25 HISTORY — PX: IR BILIARY DRAIN PLACEMENT WITH CHOLANGIOGRAM: IMG6043

## 2017-03-25 LAB — GLUCOSE, CAPILLARY
GLUCOSE-CAPILLARY: 122 mg/dL — AB (ref 65–99)
GLUCOSE-CAPILLARY: 127 mg/dL — AB (ref 65–99)
Glucose-Capillary: 106 mg/dL — ABNORMAL HIGH (ref 65–99)
Glucose-Capillary: 136 mg/dL — ABNORMAL HIGH (ref 65–99)

## 2017-03-25 LAB — CULTURE, BLOOD (ROUTINE X 2): SPECIAL REQUESTS: ADEQUATE

## 2017-03-25 LAB — COMPREHENSIVE METABOLIC PANEL
ALK PHOS: 200 U/L — AB (ref 38–126)
ALT: 99 U/L — AB (ref 14–54)
ANION GAP: 9 (ref 5–15)
AST: 65 U/L — ABNORMAL HIGH (ref 15–41)
Albumin: 2.5 g/dL — ABNORMAL LOW (ref 3.5–5.0)
BUN: 16 mg/dL (ref 6–20)
CALCIUM: 8.1 mg/dL — AB (ref 8.9–10.3)
CO2: 28 mmol/L (ref 22–32)
CREATININE: 1.45 mg/dL — AB (ref 0.44–1.00)
Chloride: 99 mmol/L — ABNORMAL LOW (ref 101–111)
GFR, EST AFRICAN AMERICAN: 41 mL/min — AB (ref >60)
GFR, EST NON AFRICAN AMERICAN: 35 mL/min — AB (ref >60)
Glucose, Bld: 105 mg/dL — ABNORMAL HIGH (ref 65–99)
Potassium: 3.2 mmol/L — ABNORMAL LOW (ref 3.5–5.1)
SODIUM: 136 mmol/L (ref 135–145)
TOTAL PROTEIN: 5.6 g/dL — AB (ref 6.5–8.1)
Total Bilirubin: 1.8 mg/dL — ABNORMAL HIGH (ref 0.3–1.2)

## 2017-03-25 LAB — CBC
HCT: 32.8 % — ABNORMAL LOW (ref 36.0–46.0)
HEMOGLOBIN: 10.1 g/dL — AB (ref 12.0–15.0)
MCH: 25.8 pg — AB (ref 26.0–34.0)
MCHC: 30.8 g/dL (ref 30.0–36.0)
MCV: 83.7 fL (ref 78.0–100.0)
PLATELETS: 120 10*3/uL — AB (ref 150–400)
RBC: 3.92 MIL/uL (ref 3.87–5.11)
RDW: 18.5 % — ABNORMAL HIGH (ref 11.5–15.5)
WBC: 13.2 10*3/uL — AB (ref 4.0–10.5)

## 2017-03-25 LAB — MAGNESIUM: Magnesium: 2.5 mg/dL — ABNORMAL HIGH (ref 1.7–2.4)

## 2017-03-25 MED ORDER — METOPROLOL TARTRATE 5 MG/5ML IV SOLN
5.0000 mg | Freq: Once | INTRAVENOUS | Status: AC
  Administered 2017-03-25: 5 mg via INTRAVENOUS

## 2017-03-25 MED ORDER — METOPROLOL TARTRATE 5 MG/5ML IV SOLN
5.0000 mg | Freq: Once | INTRAVENOUS | Status: AC
  Administered 2017-03-25: 5 mg via INTRAVENOUS
  Filled 2017-03-25: qty 5

## 2017-03-25 MED ORDER — LEVALBUTEROL HCL 1.25 MG/0.5ML IN NEBU
1.2500 mg | INHALATION_SOLUTION | Freq: Three times a day (TID) | RESPIRATORY_TRACT | Status: DC
  Administered 2017-03-26 – 2017-03-28 (×7): 1.25 mg via RESPIRATORY_TRACT
  Filled 2017-03-25 (×7): qty 0.5

## 2017-03-25 MED ORDER — METOPROLOL TARTRATE 5 MG/5ML IV SOLN
5.0000 mg | Freq: Four times a day (QID) | INTRAVENOUS | Status: DC | PRN
  Administered 2017-03-25 – 2017-03-29 (×2): 5 mg via INTRAVENOUS
  Filled 2017-03-25 (×3): qty 5

## 2017-03-25 MED ORDER — DILTIAZEM HCL 25 MG/5ML IV SOLN
10.0000 mg | Freq: Once | INTRAVENOUS | Status: AC
  Administered 2017-03-25: 10 mg via INTRAVENOUS
  Filled 2017-03-25: qty 5

## 2017-03-25 MED ORDER — LEVALBUTEROL HCL 1.25 MG/0.5ML IN NEBU
1.2500 mg | INHALATION_SOLUTION | Freq: Three times a day (TID) | RESPIRATORY_TRACT | Status: DC
  Administered 2017-03-25 (×2): 1.25 mg via RESPIRATORY_TRACT
  Filled 2017-03-25 (×2): qty 0.5

## 2017-03-25 MED ORDER — FUROSEMIDE 10 MG/ML IJ SOLN
20.0000 mg | Freq: Once | INTRAMUSCULAR | Status: AC
  Administered 2017-03-25: 20 mg via INTRAVENOUS
  Filled 2017-03-25: qty 2

## 2017-03-25 MED ORDER — DILTIAZEM HCL 100 MG IV SOLR
5.0000 mg/h | INTRAVENOUS | Status: DC
  Administered 2017-03-26: 5 mg/h via INTRAVENOUS
  Filled 2017-03-25: qty 100

## 2017-03-25 NOTE — Progress Notes (Signed)
[facility] Progress Note  Chief Complaint: cholangitis  Subjective  History:  Peggy Armstrong has done well overnight since placement of a external biliary drain by interventional radiology late yesterday afternoon. She has manageable pain at the tube site. She reports that her deeper right upper quadrant pain has significantly improved, she still feels somewhat short of breath. She has been feeling weak, has not yet gotten out of bed, but is hungry.  ROS: Cardiovascular:  no chest pain  Objective:  Med list reviewed  Vital signs in last 24 hrs: Vitals:   03/25/17 0809 03/25/17 0830  BP:  112/65  Pulse:  80  Resp:  18  Temp:  99 F (37.2 C)  SpO2: 92% 100%    Physical Exam  She is still acutely ill appearing but nontoxic. She is currently receiving a breathing treatment for wheezing, but is breathing comfortably and is speaking full sentences in no distress. 2 family members are at the bedside.  HEENT: sclera mildly icteric, oral mucosa moist without lesions  Neck: supple, no thyromegaly, JVD or lymphadenopathy. Upper airway rhonchorous sounds auscultated in the neck  Cardiac: RRR without murmurs, S1S2 heard, no peripheral edema  Pulm: transmitted upper airway sounds, normal RR and effort noted  Abdomen: soft, obese, no tenderness, with active bowel sounds. annot assess hepatomegaly due to BMI. External biliary drain is exiting the epigastrium,, from the left lobe of the liver. It is draining clear bile into the collection bag.  Skin; warm and dry, mild jaundice , no rash  Recent Labs:   Recent Labs Lab 03/23/17 0236 03/24/17 0544 03/25/17 0623  WBC 24.2* 12.5* 13.2*  HGB 10.2* 10.6* 10.1*  HCT 32.5* 34.1* 32.8*  PLT 210 171 120*    Recent Labs Lab 03/25/17 0623  NA 136  K 3.2*  CL 99*  CO2 28  BUN 16  ALBUMIN 2.5*  ALKPHOS 200*  ALT 99*  AST 65*  GLUCOSE 105*    Recent Labs Lab 03/24/17 0717  INR 1.30    Radiologic studies:  Interventional  radiology report reviewed.  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 It was a large polypoid mass apparently arising from the area around the major papilla,and that structure could not be located. Thus, it is clearly involving the ampulla and causing the high-grade biliary obstruction. Its nature is unclear, biopsies will probably return on Monday. At that point,we will need to decide if further imaging such as endoscopic ultrasound is necessary after a consultation with hepatobiliary surgery. Review of the initial CT abdomen and pelvis reveals this abnormality in the second portion of the duodenum, but there is no current or reported adjacent inflammation, local extension or adenopathy.   Plan: Regular diet Continue IV antibiotics for control of Klebsiella sepsis Daily CBC and CMP We will follow her, further plans pending results of biopsies.  Patient, family and hospital physician updated.  Total time 30 minutes, including chart and imaging review as well as discussion with patient and family and care team.   [name] Pager [phone] Mon-Fri 8a-5p [phone] after 5p, weekends, holidays

## 2017-03-25 NOTE — Progress Notes (Addendum)
Patient ID: Peggy Armstrong, female   DOB: 1945/12/08, 71 y.o.   MRN: [id number]    PROGRESS NOTE  MARJON DOXTATER  AGT:[id number] DOB: [birth year] DOA: 03/22/2017  PCP: Thurman Coyer, MD   Brief Narrative:  71 y.o. female with history of DVT, rheumatoid arthritis, hypertension presented to the ER with epigastric pain associated with nausea and non bloody vomiting, poor oral intake. CT abd in ED notable for mild intra and extrahepatic biliary ductal dilation. GI consulted.  Assessment & Plan:   Principal Problem:   Sepsis due to ascending cholangitis, klebsiella bacteremia  - MRCP notable for dilated CBD and left intrahepatic biliary tree - ERCP 9/28 notable for duodenal mass, causing high grade biliary obstruction, biopsied - pt also underwent percutaneous cholangiogram and ext drain placed 9/28 - pt clinically stable this AM, LFT's trending down  - OK to advance diet - continue rocephin for Klebsiella bacteremia and follow up on sensitivity report - repeat blood cultures in AM to ensure clearing (as WBC is up this AM) - repeat CMET in AM, CBC in AM - appreciate GI team following  Active Problems:   Right leg DVT ([place]) - Eliquis has been on hold and would continue to hold as additional interventions may be needed     Acute kidney injury - appears to be pre renal in etiology in the setting of acute illness - will stop IVF as pt is more dyspneic this AM - repeat BMP in AM    Dyspnea - some crackles on exam and wheezing - stop IVF  - allow BD's as needed - give one dose of lasix     Hypokalemia - with low Mg - will supplement and repeat electrolytes in AM    RA (rheumatoid arthritis) ([place]) - outpatient follow up     Iron deficiency anemia - pt reports intermittent bleeding in the past requiring transfusions, last iron infusion was about two weeks ago - no need for iron infusion at this time - Hg overall stable and no indication for blood transfusion either    Thrombocytopenia - suspect reactive from the above procedures and acute illness - will monitor  - CBC in AM    Morbid obesity  - Body mass index is 46.24 kg/m    Sinus Tachycardia - HR stable this AM - pt on Cardizem   DVT prophylaxis: Eliquis has been on hold  Code Status: Full  Family Communication: discussed with pt and family at bedside  Disposition Plan: to be determined   Consultants:   GI  Procedures:   MRCP 9/27 - results below   ERCP 9/28 - mass in the second portion of the duodenum causing high grade biliary obstruction   Percutaneous cholangiogram and external biliary drain placed 9/28   Antimicrobials:   Zosyn 9/26 --> 9/27   Ceftriaxone 9/27 -->  Subjective: Pt reports feeling better this AM but more dyspnea.   Objective: Vitals:   03/25/17 0641 03/25/17 0809 03/25/17 0830 03/25/17 1219  BP: (!) 144/85  112/65 130/86  Pulse: 96  80   Resp: (!) 24  18   Temp: 98.2 F (36.8 C)  99 F (37.2 C)   TempSrc: Oral  Oral   SpO2: 99% 92% 100%   Weight: 115 kg (253 lb 8.5 oz)     Height:        Intake/Output Summary (Last 24 hours) at 03/25/17 1343 Last data filed at 03/25/17 1108  Gross per 24 hour  Intake  1605 ml  Output             1075 ml  Net              530 ml   Filed Weights   03/24/17 0555 03/24/17 1211 03/25/17 0641  Weight: 111.5 kg (245 lb 14.4 oz) 111.1 kg (245 lb) 115 kg (253 lb 8.5 oz)   Physical Exam  Constitutional: Appears calm, NAD CVS: RRR, S1/S2 +, no murmurs, no gallops, no carotid bruit.  Pulmonary: Effort and breath sounds normal, mild bibasilar crackles with exp wheezing  Abdominal: Soft. BS +,  no distension, ext biliary drain in place, draining clear bile Musculoskeletal: Normal range of motion. No edema and no tenderness.   Data Reviewed: I have personally reviewed following labs and imaging studies  CBC:  Recent Labs Lab 03/22/17 1712 03/23/17 0236 03/24/17 0544 03/25/17 0623  WBC 16.9* 24.2*  12.5* 13.2*  NEUTROABS 16.2* 22.1*  --   --   HGB 10.9* 10.2* 10.6* 10.1*  HCT 34.9* 32.5* 34.1* 32.8*  MCV 82.3 83.1 82.0 83.7  PLT 262 210 171 301*   Basic Metabolic Panel:  Recent Labs Lab 03/22/17 1530 03/23/17 0236 03/24/17 0544 03/24/17 1131 03/25/17 0623  NA 139 138 139  --  136  K 3.0* 3.3* 2.9* 3.4* 3.2*  CL 101 102 103  --  99*  CO2 '26 24 25  ' --  28  GLUCOSE 153* 118* 114*  --  105*  BUN 28* 24* 18  --  16  CREATININE 1.39* 1.48* 1.43*  --  1.45*  CALCIUM 9.3 8.6* 8.4*  --  8.1*  MG  --   --  1.5*  --   --    Liver Function Tests:  Recent Labs Lab 03/22/17 1530 03/23/17 0236 03/24/17 0544 03/25/17 0623  AST 396* 318* 139* 65*  ALT 215* 232* 148* 99*  ALKPHOS 318* 302* 267* 200*  BILITOT 1.8* 3.2* 3.4* 1.8*  PROT 7.3 6.4* 6.4* 5.6*  ALBUMIN 3.5 3.1* 3.0* 2.5*    Recent Labs Lab 03/22/17 1530  LIPASE 37   Coagulation Profile:  Recent Labs Lab 03/23/17 0121 03/24/17 0717  INR 1.52 1.30   CBG:  Recent Labs Lab 03/24/17 1121 03/24/17 1751 03/25/17 0007 03/25/17 0709 03/25/17 1210  GLUCAP 102* 120* 127* 122* 106*   Urine analysis:    Component Value Date/Time   COLORURINE AMBER (A) 03/22/2017 1530   APPEARANCEUR HAZY (A) 03/22/2017 1530   LABSPEC 1.017 03/22/2017 1530   PHURINE 5.0 03/22/2017 1530   GLUCOSEU NEGATIVE 03/22/2017 1530   HGBUR NEGATIVE 03/22/2017 1530   BILIRUBINUR NEGATIVE 03/22/2017 1530   [place] 03/22/2017 1530   PROTEINUR NEGATIVE 03/22/2017 1530   NITRITE NEGATIVE 03/22/2017 1530   LEUKOCYTESUR NEGATIVE 03/22/2017 1530   Radiology Studies: Ir Biliary Drain Placement With Cholangiogram  Result Date: 03/25/2017 INDICATION: 71 year old female with a history of cholangitis 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 ANESTHESIA/SEDATION: Moderate (conscious) sedation was employed during this procedure. A total of Versed 2.0 mg and Fentanyl 100 mcg was administered intravenously. Moderate Sedation Time: 50 minutes. The patient's level of consciousness and vital signs were monitored continuously by radiology nursing throughout the procedure under my direct supervision. FLUOROSCOPY TIME:  Fluoroscopy Time: 5 minutes 12 seconds (218 mGy). COMPLICATIONS: None PROCEDURE: The procedure, risks, benefits, and  alternatives were explained to the patient and the patient's family. A complete informed consent was performed, with risk benefit analysis. Specific risks that were discussed for the procedure include bleeding, infection, biliary sepsis, IC use day, organ injury, need for further procedure, need for further surgery, long-term drain placement, cardiopulmonary collapse, death. Questions regarding the procedure were encouraged and answered. The patient understands and consents to the procedure. Patient is position in supine position on the fluoroscopy table, and the upper abdomen was prepped and draped in the usual sterile fashion. Maximum barrier sterile technique with sterile gowns and gloves were used for the procedure. A timeout was performed prior to the initiation of the procedure. Local anesthesia was provided with 1% lidocaine with epinephrine. Ultrasound survey of the left liver lobe was performed, with then ultrasound of the right liver lobe. 1% lidocaine was used for local anesthesia, with generous infiltration of the skin and subcutaneous tissues in intercostal location. A Chiba needle was advanced under ultrasound guidance into the right liver lobe. Traditional right-sided PTC unsuccessful with 3 attempts of identifying biliary system. Ultrasound guided access was then used for access into the left-sided biliary system, subcostal approach. Once the tip of this needle was confirmed within the biliary system, an  018 wire was advanced centrally. The needle was removed, a small incision was made with an 11 blade scalpel, and then a triaxial Accustick system was advanced into the biliary system. The metal stiffener and dilator were removed, we confirmed placement with contrast infusion. A coaxial Glidewire and 4 French glide cath were then used to navigate across the obstruction at the hilum of the liver. Once the catheter was presumed to be within the duodenum, the wire was removed and contrast confirmed location. A Coons wire was advanced through the system, and the Accustick and Glidewire were removed. Dilation of the subcutaneous tissue tracks was performed with an 8 [place] and then 10 [place] dilator, and then a 10 [place] biliary drain was placed. Upon positioning of the catheter, the pigtail withdrew into the distal common bile duct, which demonstrated tortuosity. The patient tolerated the procedure well and remained hemodynamically stable throughout. No complications were encountered and no significant blood loss was encountered. FINDINGS: Dilated intrahepatic ductal system. Tortuous extrahepatic biliary ductal system with obstruction the distal common bile duct. Upon placement of a standard 10 [place] biliary tube, the pigtail catheter withdrew into the distal common bile duct. In order to adequately position sideholes within the dilated intrahepatic ductal system for decompression, we elected to leave the pigtail catheter as an external drain only. The patient will likely need a modified drain for additional length and sideholes after recovery from her episode of acute cholangitis. IMPRESSION: Status post percutaneous transhepatic cholangiogram with placement of a left approach externalized biliary drain. Signed, Dulcy Fanny. Earleen Newport, DO [facility] Radiology PLAN: Given the tortuosity in length of the intrahepatic and extrahepatic biliary ductal system, the patient will  likely need a modified drain placed via the left biliary approach, in order to position an adequate number of sideholes within the intrahepatic ductal system, and position a drain across the lesion into the duodenum. Plan for repeat through the tube cholangiogram and drain exchange after her initial recovery in a few days to a week. Electronically Signed   By: Corrie Mckusick D.O.   On: 03/25/2017 09:58   Scheduled Meds: . allopurinol  300 mg Oral Daily  . diltiazem  300 mg Oral Daily  . furosemide  20  mg Intravenous Once  . indomethacin  50 mg Rectal Once  . Influenza vac split quadrivalent PF  0.5 mL Intramuscular Tomorrow-1000  . levalbuterol  1.25 mg Nebulization Q8H  . sodium chloride flush  5 mL Intravenous Q8H   Continuous Infusions: . cefTRIAXone (ROCEPHIN)  IV Stopped (03/24/17 2346)    LOS: 3 days   Time spent: 25 minutes   Faye Ramsay, MD [facility] Pager [phone]  If 7PM-7AM, please contact night-coverage [email or web address] Password [email or web address] 03/25/2017, 1:43 PM

## 2017-03-26 DIAGNOSIS — I4892 Unspecified atrial flutter: Secondary | ICD-10-CM

## 2017-03-26 LAB — BASIC METABOLIC PANEL
Anion gap: 9 (ref 5–15)
BUN: 15 mg/dL (ref 6–20)
CALCIUM: 8.5 mg/dL — AB (ref 8.9–10.3)
CHLORIDE: 98 mmol/L — AB (ref 101–111)
CO2: 29 mmol/L (ref 22–32)
CREATININE: 1.29 mg/dL — AB (ref 0.44–1.00)
GFR, EST AFRICAN AMERICAN: 47 mL/min — AB (ref >60)
GFR, EST NON AFRICAN AMERICAN: 41 mL/min — AB (ref >60)
Glucose, Bld: 106 mg/dL — ABNORMAL HIGH (ref 65–99)
Potassium: 3.5 mmol/L (ref 3.5–5.1)
SODIUM: 136 mmol/L (ref 135–145)

## 2017-03-26 LAB — CBC
HEMATOCRIT: 34.8 % — AB (ref 36.0–46.0)
HEMOGLOBIN: 10.6 g/dL — AB (ref 12.0–15.0)
MCH: 25.4 pg — ABNORMAL LOW (ref 26.0–34.0)
MCHC: 30.5 g/dL (ref 30.0–36.0)
MCV: 83.5 fL (ref 78.0–100.0)
Platelets: 186 10*3/uL (ref 150–400)
RBC: 4.17 MIL/uL (ref 3.87–5.11)
RDW: 18.3 % — AB (ref 11.5–15.5)
WBC: 14 10*3/uL — ABNORMAL HIGH (ref 4.0–10.5)

## 2017-03-26 LAB — GLUCOSE, CAPILLARY
GLUCOSE-CAPILLARY: 119 mg/dL — AB (ref 65–99)
GLUCOSE-CAPILLARY: 116 mg/dL — AB (ref 65–99)
Glucose-Capillary: 105 mg/dL — ABNORMAL HIGH (ref 65–99)
Glucose-Capillary: 109 mg/dL — ABNORMAL HIGH (ref 65–99)
Glucose-Capillary: 117 mg/dL — ABNORMAL HIGH (ref 65–99)

## 2017-03-26 LAB — COMPREHENSIVE METABOLIC PANEL
ALBUMIN: 2.8 g/dL — AB (ref 3.5–5.0)
ALT: 74 U/L — ABNORMAL HIGH (ref 14–54)
ANION GAP: 11 (ref 5–15)
AST: 33 U/L (ref 15–41)
Alkaline Phosphatase: 188 U/L — ABNORMAL HIGH (ref 38–126)
BILIRUBIN TOTAL: 1.2 mg/dL (ref 0.3–1.2)
BUN: 16 mg/dL (ref 6–20)
CALCIUM: 8.5 mg/dL — AB (ref 8.9–10.3)
CO2: 32 mmol/L (ref 22–32)
Chloride: 95 mmol/L — ABNORMAL LOW (ref 101–111)
Creatinine, Ser: 1.43 mg/dL — ABNORMAL HIGH (ref 0.44–1.00)
GFR calc Af Amer: 42 mL/min — ABNORMAL LOW (ref >60)
GFR calc non Af Amer: 36 mL/min — ABNORMAL LOW (ref >60)
GLUCOSE: 101 mg/dL — AB (ref 65–99)
Potassium: 2.6 mmol/L — CL (ref 3.5–5.1)
Sodium: 138 mmol/L (ref 135–145)
TOTAL PROTEIN: 6.4 g/dL — AB (ref 6.5–8.1)

## 2017-03-26 LAB — MRSA PCR SCREENING: MRSA BY PCR: NEGATIVE

## 2017-03-26 LAB — TSH: TSH: 4.457 u[IU]/mL (ref 0.350–4.500)

## 2017-03-26 LAB — MAGNESIUM: MAGNESIUM: 1.8 mg/dL (ref 1.7–2.4)

## 2017-03-26 MED ORDER — SODIUM CHLORIDE 0.9 % IV BOLUS (SEPSIS)
500.0000 mL | Freq: Once | INTRAVENOUS | Status: AC
  Administered 2017-03-26: 500 mL via INTRAVENOUS

## 2017-03-26 MED ORDER — SENNOSIDES-DOCUSATE SODIUM 8.6-50 MG PO TABS
1.0000 | ORAL_TABLET | Freq: Two times a day (BID) | ORAL | Status: DC
  Administered 2017-03-26 – 2017-03-29 (×6): 1 via ORAL
  Filled 2017-03-26 (×8): qty 1

## 2017-03-26 MED ORDER — PROMETHAZINE HCL 25 MG/ML IJ SOLN
12.5000 mg | Freq: Once | INTRAMUSCULAR | Status: AC
  Administered 2017-03-26: 12.5 mg via INTRAVENOUS
  Filled 2017-03-26: qty 1

## 2017-03-26 MED ORDER — AMIODARONE LOAD VIA INFUSION
150.0000 mg | Freq: Once | INTRAVENOUS | Status: DC
  Filled 2017-03-26: qty 83.34

## 2017-03-26 MED ORDER — METOPROLOL TARTRATE 5 MG/5ML IV SOLN
10.0000 mg | Freq: Once | INTRAVENOUS | Status: AC
  Administered 2017-03-26: 5 mg via INTRAVENOUS
  Filled 2017-03-26: qty 10

## 2017-03-26 MED ORDER — AMIODARONE HCL IN DEXTROSE 360-4.14 MG/200ML-% IV SOLN
60.0000 mg/h | INTRAVENOUS | Status: DC
  Filled 2017-03-26: qty 200

## 2017-03-26 MED ORDER — AMIODARONE HCL IN DEXTROSE 360-4.14 MG/200ML-% IV SOLN
30.0000 mg/h | INTRAVENOUS | Status: DC
  Administered 2017-03-26: 30 mg/h via INTRAVENOUS

## 2017-03-26 MED ORDER — KETOROLAC TROMETHAMINE 30 MG/ML IJ SOLN
30.0000 mg | Freq: Four times a day (QID) | INTRAMUSCULAR | Status: DC | PRN
  Administered 2017-03-26 – 2017-03-27 (×2): 30 mg via INTRAVENOUS
  Filled 2017-03-26 (×2): qty 1

## 2017-03-26 MED ORDER — MAGNESIUM SULFATE 2 GM/50ML IV SOLN
2.0000 g | Freq: Once | INTRAVENOUS | Status: AC
  Administered 2017-03-26: 2 g via INTRAVENOUS
  Filled 2017-03-26: qty 50

## 2017-03-26 MED ORDER — METOPROLOL TARTRATE 5 MG/5ML IV SOLN
5.0000 mg | Freq: Once | INTRAVENOUS | Status: AC
  Administered 2017-03-26: 5 mg via INTRAVENOUS

## 2017-03-26 MED ORDER — DIGOXIN 0.25 MG/ML IJ SOLN
0.2500 mg | Freq: Once | INTRAMUSCULAR | Status: AC
Start: 2017-03-26 — End: 2017-03-26
  Administered 2017-03-26: 0.25 mg via INTRAVENOUS
  Filled 2017-03-26: qty 1

## 2017-03-26 MED ORDER — POTASSIUM CHLORIDE 10 MEQ/100ML IV SOLN
10.0000 meq | INTRAVENOUS | Status: AC
  Administered 2017-03-26 (×4): 10 meq via INTRAVENOUS
  Filled 2017-03-26 (×4): qty 100

## 2017-03-26 MED ORDER — POTASSIUM CHLORIDE CRYS ER 20 MEQ PO TBCR
40.0000 meq | EXTENDED_RELEASE_TABLET | Freq: Once | ORAL | Status: AC
  Administered 2017-03-26: 40 meq via ORAL
  Filled 2017-03-26: qty 2

## 2017-03-26 MED ORDER — TRAMADOL HCL 50 MG PO TABS: 50.0000 mg | ORAL_TABLET | Freq: Four times a day (QID) | ORAL | Status: DC | PRN

## 2017-03-26 MED ORDER — POLYETHYLENE GLYCOL 3350 17 G PO PACK
17.0000 g | PACK | Freq: Every day | ORAL | Status: DC
  Administered 2017-03-27 – 2017-03-29 (×3): 17 g via ORAL
  Filled 2017-03-26 (×3): qty 1

## 2017-03-26 MED ORDER — SODIUM CHLORIDE 0.9 % IV SOLN
1.0000 g | Freq: Once | INTRAVENOUS | Status: AC
  Administered 2017-03-26: 1 g via INTRAVENOUS
  Filled 2017-03-26: qty 10

## 2017-03-26 MED ORDER — AMIODARONE HCL IN DEXTROSE 360-4.14 MG/200ML-% IV SOLN
30.0000 mg/h | INTRAVENOUS | Status: DC
  Administered 2017-03-26 – 2017-03-28 (×4): 30 mg/h via INTRAVENOUS
  Filled 2017-03-26 (×4): qty 200

## 2017-03-26 NOTE — Progress Notes (Signed)
Patient ID: Peggy Armstrong, female   DOB: [birth year], 71 y.o.   MRN: [id number]    Progress Note   Subjective  Patient was transferred to stepdown last evening after she developed tachycardia secondary to atrial fibrillation. Now with A. Fib/flutter-remains tachycardic, cardiology has seen this a.m. and starting amiodarone drip No current c/o abdominal  pain , able to eat solid food  Biliary drain intact- draining bilious 885 cc yesterday BC + Klebsiella Ampullary mass bx - P   Objective   Vital signs in last 24 hours: Temp:  [98.1 F (36.7 C)-99 F (37.2 C)] 98.8 F (37.1 C) (09/30 0810) Pulse Rate:  [95-157] 150 (09/30 0630) Resp:  [18-33] 32 (09/30 0630) BP: (86-139)/(29-89) 96/58 (09/30 0630) SpO2:  [93 %-99 %] 98 % (09/30 0842) Weight:  [241 lb 13.5 oz (109.7 kg)] 241 lb 13.5 oz (109.7 kg) (09/30 0103) Last BM Date: 03/22/17 General: older white female in NAD chatting with family, Heart:  Very tachy no murmurs Lungs: Respirations even and unlabored, lungs CTA bilaterally Abdomen:  Soft, mildly tenderRUQ at drain site, and nondistended. Normal bowel sounds. Extremities:  Without edema. Neurologic:  Alert and oriented,  grossly normal neurologically. Psych:  Cooperative. Normal mood and affect.  Intake/Output from previous day: 09/29 0701 - 09/30 0700 In: 745 [P.O.:540; IV Piggyback:200] Out: 6378 [Urine:1650; Drains:885] Intake/Output this shift: No intake/output data recorded.  Lab Results:  Recent Labs  03/24/17 0544 03/25/17 0623 03/26/17 0149  WBC 12.5* 13.2* 14.0*  HGB 10.6* 10.1* 10.6*  HCT 34.1* 32.8* 34.8*  PLT 171 120* 186   BMET  Recent Labs  03/25/17 0623 03/26/17 0149 03/26/17 0810  NA 136 138 136  K 3.2* 2.6* 3.5  CL 99* 95* 98*  CO2 28 32 29  GLUCOSE 105* 101* 106*  BUN '16 16 15  ' CREATININE 1.45* 1.43* 1.29*  CALCIUM 8.1* 8.5* 8.5*   LFT  Recent Labs  03/26/17 0149  PROT 6.4*  ALBUMIN 2.8*  AST 33  ALT 74*  ALKPHOS 188*    BILITOT 1.2   PT/INR  Recent Labs  03/24/17 0717  LABPROT 16.1*  INR 1.30    Studies/Results: Ir Biliary Drain Placement With Cholangiogram  Result Date: 03/25/2017 INDICATION: 71 year old female with a history of cholangitis 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 ANESTHESIA/SEDATION: Moderate (conscious) sedation was employed during this procedure. A total of Versed 2.0 mg and Fentanyl 100 mcg was administered intravenously. Moderate Sedation Time: 50 minutes. The patient's level of consciousness and vital signs were monitored continuously by radiology nursing throughout the procedure under my direct supervision. FLUOROSCOPY TIME:  Fluoroscopy Time: 5 minutes 12 seconds (218 mGy). COMPLICATIONS: None PROCEDURE: The procedure, risks, benefits, and alternatives were explained to the patient and the patient's family. A complete informed consent was performed, with risk benefit analysis. Specific risks that were discussed for the procedure include bleeding, infection, biliary sepsis, IC use day, organ injury, need for further procedure, need for further surgery, long-term drain placement, cardiopulmonary collapse, death. Questions regarding the procedure were encouraged and answered. The patient understands and consents to the procedure. Patient is position in supine position on the fluoroscopy table, and the upper abdomen was prepped and draped in the usual sterile fashion. Maximum barrier sterile technique with sterile gowns and gloves were used for the procedure. A timeout was performed prior to  the initiation of the procedure. Local anesthesia was provided with 1% lidocaine with epinephrine. Ultrasound survey of the left liver lobe was performed, with then ultrasound of the right liver lobe. 1%  lidocaine was used for local anesthesia, with generous infiltration of the skin and subcutaneous tissues in intercostal location. A Chiba needle was advanced under ultrasound guidance into the right liver lobe. Traditional right-sided PTC unsuccessful with 3 attempts of identifying biliary system. Ultrasound guided access was then used for access into the left-sided biliary system, subcostal approach. Once the tip of this needle was confirmed within the biliary system, an 018 wire was advanced centrally. The needle was removed, a small incision was made with an 11 blade scalpel, and then a triaxial Accustick system was advanced into the biliary system. The metal stiffener and dilator were removed, we confirmed placement with contrast infusion. A coaxial Glidewire and 4 French glide cath were then used to navigate across the obstruction at the hilum of the liver. Once the catheter was presumed to be within the duodenum, the wire was removed and contrast confirmed location. A Coons wire was advanced through the system, and the Accustick and Glidewire were removed. Dilation of the subcutaneous tissue tracks was performed with an 8 [place] and then 10 [place] dilator, and then a 10 [place] biliary drain was placed. Upon positioning of the catheter, the pigtail withdrew into the distal common bile duct, which demonstrated tortuosity. The patient tolerated the procedure well and remained hemodynamically stable throughout. No complications were encountered and no significant blood loss was encountered. FINDINGS: Dilated intrahepatic ductal system. Tortuous extrahepatic biliary ductal system with obstruction the distal common bile duct. Upon placement of a standard 10 [place] biliary tube, the pigtail catheter withdrew into the distal common bile duct. In order to adequately position sideholes within the dilated intrahepatic ductal system for decompression, we elected to leave the pigtail catheter as an external drain only. The  patient will likely need a modified drain for additional length and sideholes after recovery from her episode of acute cholangitis. IMPRESSION: Status post percutaneous transhepatic cholangiogram with placement of a left approach externalized biliary drain. Signed, Dulcy Fanny. Earleen Newport, DO [facility] PLAN: Given the tortuosity in length of the intrahepatic and extrahepatic biliary ductal system, the patient will likely need a modified drain placed via the left biliary approach, in order to position an adequate number of sideholes within the intrahepatic ductal system, and position a drain across the lesion into the duodenum. Plan for repeat through the tube cholangiogram and drain exchange after her initial recovery in a few days to a week. Electronically Signed   By: Corrie Mckusick D.O.   On: 03/25/2017 09:58       Assessment / Plan:    #30 71 year old female with biliary obstruction, and  ascending cholangitis with Klebsiella/Enterobacter bacteremia. 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    Contact  Amy Esterwood, P.A.-C               [phone]      Principal Problem:   SIRS (systemic inflammatory response syndrome) (HCC) Active Problems:   Right leg DVT (HCC)   RA (rheumatoid arthritis) (HCC)   Iron deficiency anemia   Choledocholithiasis   Cholangitis   Epigastric pain   Duodenal  mass     LOS: 4 days   Amy Esterwood  03/26/2017, 10:15 AM   I have discussed the case with the PA, and that is the plan I formulated. I personally interviewed and examined the patient. Of note, the diagnosis of choledocholithiasis above is erroneous. It was a possible cause of the biliary obstruction, but not established on imaging.the distal common bile duct was noted to be tortuous in the percutaneous cholangiogram, but no stones were described.  This patient's sepsis has resolved, her bacteremia is on appropriate antibiotic coverage. The biliary drain is functioning well with clear bilious output. She is under cardiology care for new onset A. Fib/flutter overnight, probably precipitated by the physiologic stress of this sepsis. I updated her and her family on the plan.I expect that her biopsies will return tomorrow, then we will need a consultation from hepatobiliary surgery. She might need an endoscopic ultrasound, depending upon the biopsy results. She also needs follow-up by interventional radiology this week in case this drain is required after discharge while we are engaging the workup and plans for management of this duodenal mass.  Dr. Fuller Plan will assume the consult service tomorrow, and I will sign this patient out to him in detail.  [name] Pager [phone]  Mon-Fri 8a-5p [phone] after 5p, weekends, holidays

## 2017-03-26 NOTE — Progress Notes (Signed)
This shift RN noted pts  HR was sustaining in 150's.  Call from central telemetry that pt converted to AFIB.Pt asymptomatic with no complaints of chest pain or heart racing. Sitting having a conversation with family in the room. EKG done, rapid response contacted after . BP WNL. No decrease to  HR when metoprolol given. Hospitalist also contacted and new orders given for additional dose of metoprolol. No change. Rapid respone RN paged attending Blount, and received new orders  for IV cardizem push. If no changes advised to transfer to stepdown. No change reported to attending, Blount and order placed to transfer pt. Pt and family advised that room transfer to 1238 will occur.  Will continue to monitor

## 2017-03-26 NOTE — Progress Notes (Signed)
CRITICAL VALUE ALERT  Critical Value: K= 2.6  Date & Time Notied:  03/26/17 @ 0228  Provider Notified: Jeannette Corpus., NP  Orders Received/Actions taken: order for K replacement received

## 2017-03-26 NOTE — Consult Note (Addendum)
Primary cardiologist: n/a Consulting cardiologist: Dr Carlyle Dolly Requesting physician: Dr Doyle Askew Indication: tachycardia  Clinical Summary Peggy Armstrong is a 71 y.o.female history of DVT on eliquis, RA, HTN admitted with epigastric pain and N/V. Diagnosed with sepsis due to ascending cholangitis and klebsiella bacteremia managed by primary team and GI. Admission has also been complicated by AKI, thrombocytopenia, hypokalemia, and tachycardia and afb we are consulted on to help manage.  She denies any prior cardiac history, no prior arrhythmias she is aware of. Denies any palpitations, today just notices some hot flashes.       Allergies  Allergen Reactions  . Codeine Nausea And Vomiting    Made "very sick"    Medications Scheduled Medications: . allopurinol  300 mg Oral Daily  . indomethacin  50 mg Rectal Once  . Influenza vac split quadrivalent PF  0.5 mL Intramuscular Tomorrow-1000  . levalbuterol  1.25 mg Nebulization TID  . sodium chloride flush  5 mL Intravenous Q8H     Infusions: . cefTRIAXone (ROCEPHIN)  IV Stopped (03/25/17 2214)  . diltiazem (CARDIZEM) infusion 15 mg/hr (03/26/17 0230)     PRN Medications:  acetaminophen **OR** acetaminophen, benzocaine, hydrALAZINE, HYDROmorphone (DILAUDID) injection, ketorolac, metoprolol tartrate, ondansetron **OR** ondansetron (ZOFRAN) IV, oxyCODONE, traMADol   Past Medical History:  Diagnosis Date  . Clotting disorder ([place])    right DVT  . Gout   . Gout   . Hypertension   . RA (rheumatoid arthritis) ([place])     Past Surgical History:  Procedure Laterality Date  . ABDOMINAL HYSTERECTOMY    . CHOLECYSTECTOMY    . HERNIA REPAIR    . IR BILIARY DRAIN PLACEMENT WITH CHOLANGIOGRAM  03/25/2017    Family History  Problem Relation Age of Onset  . Cancer Father        prostate ca    Social History Peggy Armstrong reports that she has never smoked. She has never used smokeless tobacco. Peggy Armstrong reports that she  does not drink alcohol.  Review of Systems CONSTITUTIONAL: No weight loss, fever, chills, weakness or fatigue.  HEENT: Eyes: No visual loss, blurred vision, double vision or yellow sclerae. No hearing loss, sneezing, congestion, runny nose or sore throat.  SKIN: No rash or itching.  CARDIOVASCULAR: No chest pain, chest pressure or chest discomfort. No palpitations or edema.  RESPIRATORY: No shortness of breath, cough or sputum.  GASTROINTESTINAL: No anorexia, nausea, vomiting or diarrhea. No abdominal pain or blood.  GENITOURINARY: no polyuria, no dysuria NEUROLOGICAL: No headache, dizziness, syncope, paralysis, ataxia, numbness or tingling in the extremities. No change in bowel or bladder control.  MUSCULOSKELETAL: No muscle, back pain, joint pain or stiffness.  HEMATOLOGIC: No anemia, bleeding or bruising.  LYMPHATICS: No enlarged nodes. No history of splenectomy.  PSYCHIATRIC: No history of depression or anxiety.      Physical Examination Blood pressure (!) 96/58, pulse (!) 150, temperature 98.8 F (37.1 C), temperature source Oral, resp. rate (!) 32, height 5\' 1"  (1.549 m), weight 241 lb 13.5 oz (109.7 kg), SpO2 98 %.  Intake/Output Summary (Last 24 hours) at 03/26/17 0843 Last data filed at 03/26/17 0612  Gross per 24 hour  Intake              745 ml  Output             2135 ml  Net            -1390 ml    HEENT: sclera clear, throat clear  Cardiovascular: regular, tachy 150, no m/r/g, n jvd  Respiratory: CTAB  GI: abdomen soft, NT, ND  MSK: no LE edema  Neuro: no focal deficits  Psych: appropriate affect   Lab Results  Basic Metabolic Panel:  Recent Labs Lab 03/22/17 1530 03/23/17 0236 03/24/17 0544  03/25/17 0623 03/25/17 1430 03/26/17 0149  NA 139 138 139  --  136  --  138  K 3.0* 3.3* 2.9*  < > 3.2*  --  2.6*  CL 101 102 103  --  99*  --  95*  CO2 26 24 25   --  28  --  32  GLUCOSE 153* 118* 114*  --  105*  --  101*  BUN 28* 24* 18  --  16  --  16   CREATININE 1.39* 1.48* 1.43*  --  1.45*  --  1.43*  CALCIUM 9.3 8.6* 8.4*  --  8.1*  --  8.5*  MG  --   --  1.5*  --   --  2.5* 1.8  < > = values in this interval not displayed.  Liver Function Tests:  Recent Labs Lab 03/22/17 1530 03/23/17 0236 03/24/17 0544 03/25/17 0623 03/26/17 0149  AST 396* 318* 139* 65* 33  ALT 215* 232* 148* 99* 74*  ALKPHOS 318* 302* 267* 200* 188*  BILITOT 1.8* 3.2* 3.4* 1.8* 1.2  PROT 7.3 6.4* 6.4* 5.6* 6.4*  ALBUMIN 3.5 3.1* 3.0* 2.5* 2.8*    CBC:  Recent Labs Lab 03/22/17 1712 03/23/17 0236 03/24/17 0544 03/25/17 0623 03/26/17 0149  WBC 16.9* 24.2* 12.5* 13.2* 14.0*  NEUTROABS 16.2* 22.1*  --   --   --   HGB 10.9* 10.2* 10.6* 10.1* 10.6*  HCT 34.9* 32.5* 34.1* 32.8* 34.8*  MCV 82.3 83.1 82.0 83.7 83.5  PLT 262 210 171 120* 186    Cardiac Enzymes: No results for input(s): CKTOTAL, CKMB, CKMBINDEX, TROPONINI in the last 168 hours.  BNP: Invalid input(s): POCBNP      Impression/Recommendations 1. Afib/aflutter with RVR - 03/25/17  22:37 EKG with afib. EKG this AM shows aflutter with RVR - patient has been on dilt drip with poorly controlled rates and soft bp's - K is 2.6, Mg 1.8  - patient with aflutter in setting of sepsis and hypokalemia, rates not controlled with dilt gtt and now with soft bp's - no known prior history of afib/aflutter. Has been on eliquis at home for DVT, on hold due to ongoing invasive GI procedures. - we will start amiodarone drip, likely short course as suspect her drive for tachcyardia will decreas as her infection improves. - short duration of aflutter thus far, off anticoag for needed GI procedures. Will need to complete eliquis course for her DVT, and after that will need to be evaluated to see if indication to continue for afib/aflutter - will need echo once rates better controlled, f/u TSH   2. Hypokalemia - replacement per primary team.   Carlyle Dolly, M.D.

## 2017-03-26 NOTE — Progress Notes (Signed)
Patient ID: Peggy Armstrong, female   DOB: 1946/06/08, 71 y.o.   MRN: [id number]    PROGRESS NOTE  Peggy Armstrong  GYJ:[id number] DOB: January 17, 1946 DOA: 03/22/2017  PCP: Thurman Coyer, MD   Brief Narrative:  71 y.o. female with history of DVT, rheumatoid arthritis, hypertension presented to the ER with epigastric pain associated with nausea and non bloody vomiting, poor oral intake. CT abd in ED notable for mild intra and extrahepatic biliary ductal dilation. GI consulted.  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, causing high grade biliary obstruction, biopsied - pt also underwent percutaneous cholangiogram and ext drain placed 9/28 - continue rocephin for Klebsiella bacteremia and follow up on sensitivity report - repeat blood cultures obtained this AM 9/30 - LFT's trending down - repeat CMET in AM - pt tolerating diet well so far, can keep on same diet  - appreciate GI team following   Active Problems:   A-fib, flutter with RVR 9/29 - 9/30 - attempted to give metoprolol and even started on Cardizem drip but with poor response - cardiology consulted  - pt started on Amiodarone drip - K and Mg are on low end of normal, try to keep K ~4, Mg ~2 - will give additional dose of K and Mg this AM - appreciate cardio assistance     Right leg DVT ([place]) - Eliquis has been on hold and would continue to hold as additional interventions may be needed     Acute kidney injury - appears to be pre renal in etiology in the setting of acute illness - Cr is trending down  - BMP In AM   Dyspnea - some crackles on exam and wheezing 9/29 - IVF stopped and dose of Lasix given - resp status stable this AM     Hypokalemia - on low end of normal - will give additional dose this AM  - BMP and Mg in AM    RA (rheumatoid arthritis) ([place]) - outpatient follow up     Iron deficiency anemia - pt reports intermittent bleeding in the past requiring transfusions, last iron infusion was about two weeks ago - no need for iron or blood transfusion at this time     Thrombocytopenia - suspect reactive from the above procedures and acute illness - resolved     Morbid obesity  - Body mass index is 46.24 kg/m  DVT prophylaxis: Eliquis has been on hold  Code Status: Full  Family Communication: pt and family at bedside  Disposition Plan: to be determined   Consultants:   GI  Cardiology  IR  Procedures:   MRCP 9/27 - results below   ERCP 9/28 - mass in the second portion of the duodenum causing high grade biliary obstruction   Percutaneous cholangiogram and external biliary drain placed 9/28   Antimicrobials:   Zosyn 9/26 --> 9/27   Ceftriaxone 9/27 -->  Subjective: Pt reports feeling anxious about her elevated HR.  Objective: Vitals:   03/26/17 0600 03/26/17 0630 03/26/17 0810 03/26/17 0842  BP:  (!) 96/58    Pulse: (!) 157 Marland Kitchen)  150    Resp: (!) 27 (!) 32    Temp:   98.8 F (37.1 C)   TempSrc:   Oral   SpO2: 95% 95%  98%  Weight:      Height:        Intake/Output Summary (Last 24 hours) at 03/26/17 1017 Last data filed at 03/26/17 0612  Gross per 24 hour  Intake              565 ml  Output             2135 ml  Net            -1570 ml   Filed Weights   03/24/17 1211 03/25/17 0641 03/26/17 0103  Weight: 111.1 kg (245 lb) 115 kg (253 lb 8.5 oz) 109.7 kg (241 lb 13.5 oz)   Physical Exam  Constitutional: Appears stable, NAD CVS: tachycardia, no gallops, no carotid bruit.  Pulmonary: Effort and breath sounds normal, diminished breath sounds at bases   Abdominal: Soft. BS +,  no distension, tenderness, rebound or guarding.  Musculoskeletal: Normal range of motion. No tenderness.   Data Reviewed: I have personally reviewed following labs and imaging studies  CBC:  Recent Labs Lab 03/22/17 1712 03/23/17 0236 03/24/17 0544 03/25/17 0623 03/26/17 0149  WBC 16.9* 24.2* 12.5* 13.2* 14.0*  NEUTROABS 16.2* 22.1*  --   --   --   HGB 10.9* 10.2* 10.6* 10.1* 10.6*  HCT 34.9* 32.5* 34.1* 32.8* 34.8*  MCV 82.3 83.1 82.0 83.7 83.5  PLT 262 210 171 120* 161   Basic Metabolic Panel:  Recent Labs Lab 03/23/17 0236 03/24/17 0544 03/24/17 1131 03/25/17 0623 03/25/17 1430 03/26/17 0149 03/26/17 0810  NA 138 139  --  136  --  138 136  K 3.3* 2.9* 3.4* 3.2*  --  2.6* 3.5  CL 102 103  --  99*  --  95* 98*  CO2 24 25  --  28  --  32 29  GLUCOSE 118* 114*  --  105*  --  101* 106*  BUN 24* 18  --  16  --  16 15  CREATININE 1.48* 1.43*  --  1.45*  --  1.43* 1.29*  CALCIUM 8.6* 8.4*  --  8.1*  --  8.5* 8.5*  MG  --  1.5*  --   --  2.5* 1.8  --    Liver Function Tests:  Recent Labs Lab 03/22/17 1530 03/23/17 0236 03/24/17 0544 03/25/17 0623 03/26/17 0149  AST 396* 318* 139* 65* 33  ALT 215* 232* 148* 99* 74*  ALKPHOS 318* 302* 267* 200* 188*  BILITOT 1.8* 3.2* 3.4* 1.8* 1.2  PROT 7.3 6.4* 6.4* 5.6* 6.4*  ALBUMIN 3.5 3.1* 3.0* 2.5* 2.8*    Recent Labs Lab 03/22/17 1530  LIPASE 37   Coagulation Profile:  Recent Labs Lab 03/23/17 0121 03/24/17 0717  INR 1.52 1.30   CBG:  Recent Labs Lab 03/25/17 0709 03/25/17 1210 03/25/17 1811 03/26/17 0008 03/26/17 0756  GLUCAP 122* 106* 136* 109* 119*   Urine analysis:    Component Value Date/Time   COLORURINE AMBER (A) 03/22/2017 1530   APPEARANCEUR HAZY (A) 03/22/2017 1530   LABSPEC 1.017 03/22/2017 [place] 5.0 03/22/2017 1530   GLUCOSEU NEGATIVE 03/22/2017 1530   HGBUR NEGATIVE 03/22/2017 1530   BILIRUBINUR NEGATIVE 03/22/2017 [place] NEGATIVE  03/22/2017 1530   PROTEINUR NEGATIVE 03/22/2017 1530   NITRITE NEGATIVE 03/22/2017 1530   LEUKOCYTESUR  NEGATIVE 03/22/2017 1530   Radiology Studies: Ir Biliary Drain Placement With Cholangiogram  Result Date: 03/25/2017 INDICATION: 71 year old female with a history of cholangitis 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 ANESTHESIA/SEDATION: Moderate (conscious) sedation was employed during this procedure. A total of Versed 2.0 mg and Fentanyl 100 mcg was administered intravenously. Moderate Sedation Time: 50 minutes. The patient's level of consciousness and vital signs were monitored continuously by radiology nursing throughout the procedure under my direct supervision. FLUOROSCOPY TIME:  Fluoroscopy Time: 5 minutes 12 seconds (218 mGy). COMPLICATIONS: None PROCEDURE: The procedure, risks, benefits, and alternatives were explained to the patient and the patient's family. A complete informed consent was performed, with risk benefit analysis. Specific risks that were discussed for the procedure include bleeding, infection, biliary sepsis, IC use day, organ injury, need for further procedure, need for further surgery, long-term drain placement, cardiopulmonary collapse, death. Questions regarding the procedure were encouraged and answered. The patient understands and consents to the procedure. Patient is position in supine position on the fluoroscopy table, and the upper abdomen was prepped and draped in the usual sterile fashion. Maximum barrier sterile technique with sterile gowns and gloves were used for the procedure. A timeout was performed prior to the initiation of the procedure. Local anesthesia was provided with 1% lidocaine with epinephrine. Ultrasound survey of the left liver lobe was performed,  with then ultrasound of the right liver lobe. 1% lidocaine was used for local anesthesia, with generous infiltration of the skin and subcutaneous tissues in intercostal location. A Chiba needle was advanced under ultrasound guidance into the right liver lobe. Traditional right-sided PTC unsuccessful with 3 attempts of identifying biliary system. Ultrasound guided access was then used for access into the left-sided biliary system, subcostal approach. Once the tip of this needle was confirmed within the biliary system, an 018 wire was advanced centrally. The needle was removed, a small incision was made with an 11 blade scalpel, and then a triaxial Accustick system was advanced into the biliary system. The metal stiffener and dilator were removed, we confirmed placement with contrast infusion. A coaxial Glidewire and 4 French glide cath were then used to navigate across the obstruction at the hilum of the liver. Once the catheter was presumed to be within the duodenum, the wire was removed and contrast confirmed location. A Coons wire was advanced through the system, and the Accustick and Glidewire were removed. Dilation of the subcutaneous tissue tracks was performed with an 8 [place] and then 10 [place] dilator, and then a 10 [place] biliary drain was placed. Upon positioning of the catheter, the pigtail withdrew into the distal common bile duct, which demonstrated tortuosity. The patient tolerated the procedure well and remained hemodynamically stable throughout. No complications were encountered and no significant blood loss was encountered. FINDINGS: Dilated intrahepatic ductal system. Tortuous extrahepatic biliary ductal system with obstruction the distal common bile duct. Upon placement of a standard 10 [place] biliary tube, the pigtail catheter withdrew into the distal common bile duct. In order to adequately position sideholes within the dilated intrahepatic ductal system for decompression, we elected to leave  the pigtail catheter as an external drain only. The patient will likely need a modified drain for additional length and sideholes after recovery from her episode of acute cholangitis. IMPRESSION: Status post percutaneous transhepatic cholangiogram with placement of a left approach externalized biliary  drain. Signed, Dulcy Fanny. Earleen Newport, DO [facility] Radiology PLAN: Given the tortuosity in length of the intrahepatic and extrahepatic biliary ductal system, the patient will likely need a modified drain placed via the left biliary approach, in order to position an adequate number of sideholes within the intrahepatic ductal system, and position a drain across the lesion into the duodenum. Plan for repeat through the tube cholangiogram and drain exchange after her initial recovery in a few days to a week. Electronically Signed   By: Corrie Mckusick D.O.   On: 03/25/2017 09:58   Scheduled Meds: . allopurinol  300 mg Oral Daily  . amiodarone  150 mg Intravenous Once  . indomethacin  50 mg Rectal Once  . Influenza vac split quadrivalent PF  0.5 mL Intramuscular Tomorrow-1000  . levalbuterol  1.25 mg Nebulization TID  . sodium chloride flush  5 mL Intravenous Q8H   Continuous Infusions: . amiodarone     Followed by  . amiodarone    . cefTRIAXone (ROCEPHIN)  IV Stopped (03/25/17 2214)    LOS: 4 days   Time spent: 35 minutes   Faye Ramsay, MD [facility] Pager [phone]  If 7PM-7AM, please contact night-coverage [email or web address] Password [email or web address] 03/26/2017, 10:17 AM

## 2017-03-27 ENCOUNTER — Encounter (HOSPITAL_COMMUNITY): Payer: Self-pay | Admitting: Gastroenterology

## 2017-03-27 DIAGNOSIS — K3189 Other diseases of stomach and duodenum: Secondary | ICD-10-CM

## 2017-03-27 DIAGNOSIS — R12 Heartburn: Secondary | ICD-10-CM

## 2017-03-27 DIAGNOSIS — I484 Atypical atrial flutter: Secondary | ICD-10-CM

## 2017-03-27 DIAGNOSIS — K8309 Other cholangitis: Secondary | ICD-10-CM

## 2017-03-27 DIAGNOSIS — R6510 Systemic inflammatory response syndrome (SIRS) of non-infectious origin without acute organ dysfunction: Secondary | ICD-10-CM

## 2017-03-27 LAB — CBC
HCT: 33.9 % — ABNORMAL LOW (ref 36.0–46.0)
Hemoglobin: 10.4 g/dL — ABNORMAL LOW (ref 12.0–15.0)
MCH: 25.4 pg — AB (ref 26.0–34.0)
MCHC: 30.7 g/dL (ref 30.0–36.0)
MCV: 82.9 fL (ref 78.0–100.0)
PLATELETS: 212 10*3/uL (ref 150–400)
RBC: 4.09 MIL/uL (ref 3.87–5.11)
RDW: 18.2 % — AB (ref 11.5–15.5)
WBC: 11.3 10*3/uL — AB (ref 4.0–10.5)

## 2017-03-27 LAB — BASIC METABOLIC PANEL
Anion gap: 11 (ref 5–15)
BUN: 18 mg/dL (ref 6–20)
CALCIUM: 8.4 mg/dL — AB (ref 8.9–10.3)
CO2: 29 mmol/L (ref 22–32)
Chloride: 98 mmol/L — ABNORMAL LOW (ref 101–111)
Creatinine, Ser: 1.39 mg/dL — ABNORMAL HIGH (ref 0.44–1.00)
GFR calc Af Amer: 43 mL/min — ABNORMAL LOW (ref >60)
GFR, EST NON AFRICAN AMERICAN: 37 mL/min — AB (ref >60)
Glucose, Bld: 90 mg/dL (ref 65–99)
POTASSIUM: 3.2 mmol/L — AB (ref 3.5–5.1)
SODIUM: 138 mmol/L (ref 135–145)

## 2017-03-27 LAB — GLUCOSE, CAPILLARY
GLUCOSE-CAPILLARY: 135 mg/dL — AB (ref 65–99)
GLUCOSE-CAPILLARY: 108 mg/dL — AB (ref 65–99)
Glucose-Capillary: 123 mg/dL — ABNORMAL HIGH (ref 65–99)
Glucose-Capillary: 120 mg/dL — ABNORMAL HIGH (ref 65–99)

## 2017-03-27 LAB — MAGNESIUM: MAGNESIUM: 2.2 mg/dL (ref 1.7–2.4)

## 2017-03-27 MED ORDER — ENOXAPARIN SODIUM 60 MG/0.6ML ~~LOC~~ SOLN
55.0000 mg | SUBCUTANEOUS | Status: DC
  Administered 2017-03-27 – 2017-03-28 (×2): 55 mg via SUBCUTANEOUS
  Filled 2017-03-27: qty 0.55
  Filled 2017-03-27: qty 0.6

## 2017-03-27 MED ORDER — METOCLOPRAMIDE HCL 5 MG/ML IJ SOLN
10.0000 mg | Freq: Once | INTRAMUSCULAR | Status: AC
Start: 2017-03-27 — End: 2017-03-27
  Administered 2017-03-27: 10 mg via INTRAVENOUS
  Filled 2017-03-27: qty 2

## 2017-03-27 MED ORDER — POTASSIUM CHLORIDE CRYS ER 20 MEQ PO TBCR
40.0000 meq | EXTENDED_RELEASE_TABLET | Freq: Once | ORAL | Status: AC
  Administered 2017-03-27: 40 meq via ORAL
  Filled 2017-03-27: qty 2

## 2017-03-27 MED ORDER — PANTOPRAZOLE SODIUM 40 MG PO TBEC
40.0000 mg | DELAYED_RELEASE_TABLET | Freq: Every day | ORAL | Status: DC
  Administered 2017-03-27 – 2017-03-30 (×4): 40 mg via ORAL
  Filled 2017-03-27 (×4): qty 1

## 2017-03-27 NOTE — Progress Notes (Signed)
Referring Physician(s): Danis,H  Supervising Physician: Daryll Brod  Patient Status:  [facility] - In-pt  Chief Complaint: Biliary obstruction, abdominal pain   Subjective: Pt looks and appears much better today; denies sig abd pain, N/V, CP or worsening cough; occ heartburn   Allergies: Codeine  Medications: Prior to Admission medications   Medication Sig Start Date End Date Taking? Authorizing Provider  albuterol (PROVENTIL HFA;VENTOLIN HFA) 108 (90 Base) MCG/ACT inhaler Inhale 2 puffs into the lungs every 6 (six) hours as needed for wheezing or shortness of breath.  10/28/16 10/28/17 Yes [provider]  allopurinol (ZYLOPRIM) 300 MG tablet Take 300 mg by mouth daily.  05/06/15  Yes [provider]  ALPRAZolam (XANAX) 0.25 MG tablet Take 0.25 mg by mouth at bedtime.  07/21/15  Yes [provider]  apixaban (ELIQUIS) 2.5 MG TABS tablet Take 2.5 mg by mouth 2 (two) times daily.   Yes [provider]  atorvastatin (LIPITOR) 10 MG tablet Take 10 mg by mouth daily. 07/12/16  Yes [provider]  chlorthalidone (HYGROTON) 25 MG tablet Take 25 mg by mouth daily. 07/22/15  Yes [provider]  cholecalciferol (VITAMIN D) 1000 units tablet Take 1,000 Units by mouth daily.   Yes [provider]  cloNIDine (CATAPRES) 0.2 MG tablet Take 0.2 mg by mouth 2 (two) times daily. 07/22/15  Yes [provider]  diltiazem (CARDIZEM CD) 300 MG 24 hr capsule Take 300 mg by mouth daily. 07/22/15  Yes [provider]  lisinopril (PRINIVIL,ZESTRIL) 10 MG tablet Take 10 mg by mouth daily. 07/22/15  Yes [provider]  vitamin B-12 (CYANOCOBALAMIN) 1000 MCG tablet Take 1,000 mcg by mouth daily.   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 ANESTHESIA/SEDATION: Moderate (conscious) sedation was employed during this procedure. A total of Versed 2.0 mg and Fentanyl 100 mcg was administered intravenously. Moderate Sedation Time: 50 minutes. The patient's level of consciousness and vital signs were monitored continuously by radiology nursing throughout the procedure under my direct supervision. FLUOROSCOPY TIME:  Fluoroscopy Time: 5 minutes 12 seconds (218 mGy). COMPLICATIONS: None PROCEDURE: The procedure, risks, benefits, and alternatives were explained to the patient and the patient's family. A complete informed consent was performed, with risk benefit analysis. Specific risks that were discussed for the procedure include bleeding, infection, biliary sepsis, IC use day, organ injury, need for further procedure, need for further surgery, long-term drain placement, cardiopulmonary collapse, death. Questions regarding the procedure were encouraged and answered. The patient understands and consents to the procedure. Patient is position in supine position on the fluoroscopy table, and the upper abdomen was prepped and draped in the usual sterile fashion. Maximum barrier sterile technique with sterile gowns and gloves were used for the procedure. A timeout was performed prior to the initiation of the procedure.  Local anesthesia was provided with 1% lidocaine with epinephrine. Ultrasound survey of the left liver lobe was performed, with then ultrasound of the right liver lobe. 1% lidocaine was  used for local anesthesia, with generous infiltration of the skin and subcutaneous tissues in intercostal location. A Chiba needle was advanced under ultrasound guidance into the right liver lobe. Traditional right-sided PTC unsuccessful with 3 attempts of identifying biliary system. Ultrasound guided access was then used for access into the left-sided biliary system, subcostal approach. Once the tip of this needle was confirmed within the biliary system, an 018 wire was advanced centrally. The needle was removed, a small incision was made with an 11 blade scalpel, and then a triaxial Accustick system was advanced into the biliary system. The metal stiffener and dilator were removed, we confirmed placement with contrast infusion. A coaxial Glidewire and 4 French glide cath were then used to navigate across the obstruction at the hilum of the liver. Once the catheter was presumed to be within the duodenum, the wire was removed and contrast confirmed location. A Coons wire was advanced through the system, and the Accustick and Glidewire were removed. Dilation of the subcutaneous tissue tracks was performed with an 8 [place] and then 10 [place] dilator, and then a 10 [place] biliary drain was placed. Upon positioning of the catheter, the pigtail withdrew into the distal common bile duct, which demonstrated tortuosity. The patient tolerated the procedure well and remained hemodynamically stable throughout. No complications were encountered and no significant blood loss was encountered. FINDINGS: Dilated intrahepatic ductal system. Tortuous extrahepatic biliary ductal system with obstruction the distal common bile duct. Upon placement of a standard 10 [place] biliary tube, the pigtail catheter withdrew into the distal common bile duct. In order to adequately position sideholes within the dilated intrahepatic ductal system for decompression, we elected to leave the pigtail catheter as an external drain only. The patient will  likely need a modified drain for additional length and sideholes after recovery from her episode of acute cholangitis. IMPRESSION: Status post percutaneous transhepatic cholangiogram with placement of a left approach externalized biliary drain. Signed, Dulcy Fanny. Earleen Newport, DO [facility] Radiology PLAN: Given the tortuosity in length of the intrahepatic and extrahepatic biliary ductal system, the patient will likely need a modified drain placed via the left biliary approach, in order to position an adequate number of sideholes within the intrahepatic ductal system, and position a drain across the lesion into the duodenum. Plan for repeat through the tube cholangiogram and drain exchange after her initial recovery in a few days to a week. Electronically Signed   By: Corrie Mckusick D.O.   On: 03/25/2017 09:58    Labs:  CBC:  Recent Labs  03/24/17 0544 03/25/17 0623 03/26/17 0149 03/27/17 0320  WBC 12.5* 13.2* 14.0* 11.3*  HGB 10.6* 10.1* 10.6* 10.4*  HCT 34.1* 32.8* 34.8* 33.9*  PLT 171 120* 186 212    COAGS:  Recent Labs  03/23/17 0121 03/24/17 0717  INR 1.52 1.30  APTT 39*  --     BMP:  Recent Labs  03/25/17 0623 03/26/17 0149 03/26/17 0810 03/27/17 0320  NA 136 138 136 138  K 3.2* 2.6* 3.5 3.2*  CL 99* 95* 98* 98*  CO2 28 32 29 29  GLUCOSE 105* 101* 106* 90  BUN '16 16 15 18  ' CALCIUM 8.1* 8.5* 8.5* 8.4*  CREATININE 1.45* 1.43* 1.29* 1.39*  GFRNONAA 35* 36* 41* 37*  GFRAA 41* 42* 47* 43*  LIVER FUNCTION TESTS:  Recent Labs  03/23/17 0236 03/24/17 0544 03/25/17 0623 03/26/17 0149  BILITOT 3.2* 3.4* 1.8* 1.2  AST 318* 139* 65* 33  ALT 232* 148* 99* 74*  ALKPHOS 302* 267* 200* 188*  PROT 6.4* 6.4* 5.6* 6.4*  ALBUMIN 3.1* 3.0* 2.5* 2.8*    Assessment and Plan: Pt with hx abd pain, biliary obstruction/cholangitis secondary to duodenal mass, failed ERCP cannulation ; s/p left external biliary drain on 9/28;  afebrile; WBC 11.3(14),hgb 10.4 stable, K 3.2,creat 1.39(1.29), repeat blood cx neg to date (initially enterobacter/klebsiella); duodenal mass bx- adenoma; for EUS with additional bx on 10/4; cont with drain irrigation, monitor LFT's, send bile for cx/cyt; f/u cholangiogram with poss I/E drain after EUS/new bx results finalized .   Electronically Signed: D. Rowe Robert, PA-C 03/27/2017, 2:40 PM   I spent a total of 15 minutes at the the patient's bedside AND on the patient's hospital floor or unit, greater than 50% of which was counseling/coordinating care for biliary drain    Patient ID: Randel Books, female   DOB: 1946/01/27, 71 y.o.   MRN: [id number]

## 2017-03-27 NOTE — Progress Notes (Signed)
Patient ID: Peggy Armstrong, female   DOB: March 18, 1946, 71 y.o.   MRN: [id number]    PROGRESS NOTE  LIVIAN VANDERBECK  HKV:[id number] DOB: [birth year] DOA: 03/22/2017  PCP: Thurman Coyer, MD   Brief Narrative:  71 y.o. female with history of DVT, rheumatoid arthritis, hypertension presented to the ER with epigastric pain associated with nausea and non bloody vomiting, poor oral intake. CT abd in ED notable for mild intra and extrahepatic biliary ductal dilation. GI consulted.  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, causing high grade biliary obstruction, biopsied - pt also underwent percutaneous cholangiogram and ext drain placed 9/28 - continue rocephin for Klebsiella bacteremia and follow up on sensitivity report - repeat blood cultures obtained 9/30 and NGTD - repeat CMET in AM - pt tolerating diet well so far, can keep on same diet  - appreciate GI team following, plan for EUS sometimes this week    Active Problems:   A-fib, flutter with RVR 9/29 - 9/30 - has not responded to Cardizem or metoprolol initially  - cardiology consulted  - pt started on Amiodarone drip and currently in NSR this AM with HR in 80's - appreciate cardiology team following     Right leg DVT ([place]) - Eliquis has been on hold and would continue to hold as additional interventions may be needed     Acute kidney injury - appears to be pre renal in etiology in the setting of acute illness - will monitor     Dyspnea - some crackles on exam and wheezing 9/29 - resp status stable this AM     Hypokalemia -  still low, will supplement  - BMP In MA    RA (rheumatoid arthritis) ([place]) - outpatient follow up     Iron deficiency anemia - pt reports intermittent bleeding in the past requiring transfusions, last iron infusion was about two weeks ago - no need for iron or blood transfusion at this time     Thrombocytopenia - suspect reactive from the above procedures and acute illness - resolved     Morbid obesity  - Body mass index is 46.24 kg/m  DVT prophylaxis: Eliquis has been on hold  Code Status: Full  Family Communication: pt and family at bedside   Disposition Plan: to be determined   Consultants:   GI  Cardiology  IR  Surgery   Procedures:   MRCP 9/27 - results below   ERCP 9/28 - mass in the second portion of the duodenum causing high grade biliary obstruction   Percutaneous cholangiogram and external biliary drain placed 9/28   Antimicrobials:   Zosyn 9/26 --> 9/27   Ceftriaxone 9/27 -->  Subjective: Pt reports feeling better.   Objective: Vitals:   03/27/17 1352 03/27/17 1400 03/27/17 1500 03/27/17 1600  BP:  127/78  139/72  Pulse:  89 91 90  Resp:  16 (!) 27 19  Temp:    98.5 F (36.9 C)  TempSrc:    Oral  SpO2: 93% 94% 93% 92%  Weight:      Height:  Intake/Output Summary (Last 24 hours) at 03/27/17 1637 Last data filed at 03/27/17 1600  Gross per 24 hour  Intake           878.09 ml  Output             1410 ml  Net          -531.91 ml   Filed Weights   03/25/17 0641 03/26/17 0103 03/27/17 0430  Weight: 115 kg (253 lb 8.5 oz) 109.7 kg (241 lb 13.5 oz) 109.5 kg (241 lb 6.5 oz)   Physical Exam  Constitutional: Appears NAD  CVS: RRR, S1/S2 +, no murmurs, no gallops, no carotid bruit.  Pulmonary: Effort and breath sounds normal, diminished breath sounds at bases  Abdominal: Soft. BS +,  no distension.  Musculoskeletal: Normal range of motion. No edema and no tenderness.  Neuro: Alert. Normal reflexes, muscle tone coordination. No  cranial nerve deficit. Skin: Skin is warm and dry. No rash noted. Not diaphoretic. No erythema. No pallor.  Psychiatric: Normal mood and affect. Behavior, judgment, thought content normal.   Data Reviewed: I have personally reviewed following labs and imaging studies  CBC:  Recent Labs Lab 03/22/17 1712 03/23/17 0236 03/24/17 0544 03/25/17 0623 03/26/17 0149 03/27/17 0320  WBC 16.9* 24.2* 12.5* 13.2* 14.0* 11.3*  NEUTROABS 16.2* 22.1*  --   --   --   --   HGB 10.9* 10.2* 10.6* 10.1* 10.6* 10.4*  HCT 34.9* 32.5* 34.1* 32.8* 34.8* 33.9*  MCV 82.3 83.1 82.0 83.7 83.5 82.9  PLT 262 210 171 120* 186 833   Basic Metabolic Panel:  Recent Labs Lab 03/24/17 0544 03/24/17 1131 03/25/17 0623 03/25/17 1430 03/26/17 0149 03/26/17 0810 03/27/17 0320  NA 139  --  136  --  138 136 138  K 2.9* 3.4* 3.2*  --  2.6* 3.5 3.2*  CL 103  --  99*  --  95* 98* 98*  CO2 25  --  28  --  32 29 29  GLUCOSE 114*  --  105*  --  101* 106* 90  BUN 18  --  16  --  16 15 18   CREATININE 1.43*  --  1.45*  --  1.43* 1.29* 1.39*  CALCIUM 8.4*  --  8.1*  --  8.5* 8.5* 8.4*  MG 1.5*  --   --  2.5* 1.8  --  2.2   Liver Function Tests:  Recent Labs Lab 03/22/17 1530 03/23/17 0236 03/24/17 0544 03/25/17 0623 03/26/17 0149  AST 396* 318* 139* 65* 33  ALT 215* 232* 148* 99* 74*  ALKPHOS 318* 302* 267* 200* 188*  BILITOT 1.8* 3.2* 3.4* 1.8* 1.2  PROT 7.3 6.4* 6.4* 5.6* 6.4*  ALBUMIN 3.5 3.1* 3.0* 2.5* 2.8*    Recent Labs Lab 03/22/17 1530  LIPASE 37   Coagulation Profile:  Recent Labs Lab 03/23/17 0121 03/24/17 0717  INR 1.52 1.30   CBG:  Recent Labs Lab 03/26/17 1329 03/26/17 1600 03/26/17 2312 03/27/17 0553 03/27/17 1126  GLUCAP 116* 117* 105* 123* 135*   Urine analysis:    Component Value Date/Time   COLORURINE AMBER (A) 03/22/2017 1530   APPEARANCEUR HAZY (A) 03/22/2017 1530   LABSPEC 1.017 03/22/2017 1530   PHURINE 5.0 03/22/2017 1530   GLUCOSEU NEGATIVE 03/22/2017  1530   HGBUR NEGATIVE 03/22/2017 [place] 03/22/2017 1530   KETONESUR NEGATIVE 03/22/2017 1530   PROTEINUR NEGATIVE 03/22/2017 1530   NITRITE NEGATIVE 03/22/2017 1530   LEUKOCYTESUR NEGATIVE  03/22/2017 1530   Radiology Studies: No results found. Scheduled Meds: . allopurinol  300 mg Oral Daily  . amiodarone  150 mg Intravenous Once  . indomethacin  50 mg Rectal Once  . levalbuterol  1.25 mg Nebulization TID  . pantoprazole  40 mg Oral Daily  . polyethylene glycol  17 g Oral Daily  . senna-docusate  1 tablet Oral BID  . sodium chloride flush  5 mL Intravenous Q8H   Continuous Infusions: . amiodarone 30 mg/hr (03/27/17 1524)  . cefTRIAXone (ROCEPHIN)  IV Stopped (03/26/17 2209)    LOS: 5 days   Time spent: 25 minutes   Faye Ramsay, MD [facility] Pager [phone]  If 7PM-7AM, please contact night-coverage [email or web address] Password [facility] 03/27/2017, 4:37 PM

## 2017-03-27 NOTE — Progress Notes (Signed)
[facility] Progress Note  Chief Complaint:   Jaundice, nausea, vomiting   Subjective: Feels okay today. No abdominal pain. She has intermittent vomiting following inhalers / nebulizers but sounds like vomiting follows vigorous cough. Complains of heartburn.   Objective:  Vital signs in last 24 hours: Temp:  [97.8 F (36.6 C)-98.5 F (36.9 C)] 98.4 F (36.9 C) (10/01 0700) Pulse Rate:  [84-154] 84 (10/01 0700) Resp:  [17-30] 19 (10/01 0700) BP: (72-126)/(30-87) 111/64 (10/01 0350) SpO2:  [91 %-99 %] 99 % (10/01 0806) Weight:  [241 lb 6.5 oz (109.5 kg)] 241 lb 6.5 oz (109.5 kg) (10/01 0430) Last BM Date: 03/22/17   General:   Alert, overweight white female in NAD EENT:  Normal hearing, non icteric sclera, conjunctive pink.  Heart:  Regular rate and rhythm nolower extremity edema Pulm: Normal respiratory effort, lungs CTA bilaterally without wheezes or crackles. Abdomen:  Soft, nondistended, nontender.  Normal bowel sounds, no masses felt. Neurologic:  Alert and  oriented x4;  grossly normal neurologically. Psych:  Pleasant, cooperative.  Normal mood and affect.   Intake/Output from previous day: 09/30 0701 - 10/01 0700 In: 467.4 [I.V.:362.4; IV Piggyback:100] Out: 975 [Urine:500; Drains:475] Intake/Output this shift: No intake/output data recorded.  Lab Results:  Recent Labs  03/25/17 0623 03/26/17 0149 03/27/17 0320  WBC 13.2* 14.0* 11.3*  HGB 10.1* 10.6* 10.4*  HCT 32.8* 34.8* 33.9*  PLT 120* 186 212   BMET  Recent Labs  03/26/17 0149 03/26/17 0810 03/27/17 0320  NA 138 136 138  K 2.6* 3.5 3.2*  CL 95* 98* 98*  CO2 32 29 29  GLUCOSE 101* 106* 90  BUN '16 15 18  ' CREATININE 1.43* 1.29* 1.39*  CALCIUM 8.5* 8.5* 8.4*   LFT  Recent Labs  03/26/17 0149  PROT 6.4*  ALBUMIN 2.8*  AST 33  ALT 74*  ALKPHOS 188*  BILITOT 1.2     Ir Biliary Drain Placement With Cholangiogram  Result Date: 03/25/2017 INDICATION: 71 year old  female with a history of cholangitis 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 ANESTHESIA/SEDATION: Moderate (conscious) sedation was employed during this procedure. A total of Versed 2.0 mg and Fentanyl 100 mcg was administered intravenously. Moderate Sedation Time: 50 minutes. The patient's level of consciousness and vital signs were monitored continuously by radiology nursing throughout the procedure under my direct supervision. FLUOROSCOPY TIME:  Fluoroscopy Time: 5 minutes 12 seconds (218 mGy). COMPLICATIONS: None PROCEDURE: The procedure, risks, benefits, and alternatives were explained to the patient and the patient's family. A complete informed consent was performed, with risk benefit analysis. Specific risks that were discussed for the procedure include bleeding, infection, biliary sepsis, IC use day, organ injury, need for further procedure, need for further surgery, long-term drain placement, cardiopulmonary collapse, death. Questions regarding the procedure were encouraged and answered. The patient understands and consents to the procedure. Patient is position in supine position on the fluoroscopy table, and the upper abdomen was prepped and draped in the usual sterile fashion. Maximum barrier sterile technique with sterile gowns and gloves were used for the procedure. A timeout was performed prior to the initiation of the procedure. Local anesthesia was provided with 1% lidocaine with epinephrine. Ultrasound survey of the left liver lobe was performed, with then ultrasound of the right liver lobe. 1% lidocaine was used for local anesthesia, with generous  infiltration of the skin and subcutaneous tissues in intercostal location. A Chiba needle was advanced under ultrasound guidance into the right liver  lobe. Traditional right-sided PTC unsuccessful with 3 attempts of identifying biliary system. Ultrasound guided access was then used for access into the left-sided biliary system, subcostal approach. Once the tip of this needle was confirmed within the biliary system, an 018 wire was advanced centrally. The needle was removed, a small incision was made with an 11 blade scalpel, and then a triaxial Accustick system was advanced into the biliary system. The metal stiffener and dilator were removed, we confirmed placement with contrast infusion. A coaxial Glidewire and 4 French glide cath were then used to navigate across the obstruction at the hilum of the liver. Once the catheter was presumed to be within the duodenum, the wire was removed and contrast confirmed location. A Coons wire was advanced through the system, and the Accustick and Glidewire were removed. Dilation of the subcutaneous tissue tracks was performed with an 8 [place] and then 10 [place] dilator, and then a 10 [place] biliary drain was placed. Upon positioning of the catheter, the pigtail withdrew into the distal common bile duct, which demonstrated tortuosity. The patient tolerated the procedure well and remained hemodynamically stable throughout. No complications were encountered and no significant blood loss was encountered. FINDINGS: Dilated intrahepatic ductal system. Tortuous extrahepatic biliary ductal system with obstruction the distal common bile duct. Upon placement of a standard 10 [place] biliary tube, the pigtail catheter withdrew into the distal common bile duct. In order to adequately position sideholes within the dilated intrahepatic ductal system for decompression, we elected to leave the pigtail catheter as an external drain only. The patient will likely need a modified drain for additional length and sideholes after recovery from her episode of acute cholangitis. IMPRESSION: Status post percutaneous transhepatic cholangiogram with  placement of a left approach externalized biliary drain. Signed, Dulcy Fanny. Earleen Newport, DO [facility] PLAN: Given the tortuosity in length of the intrahepatic and extrahepatic biliary ductal system, the patient will likely need a modified drain placed via the left biliary approach, in order to position an adequate number of sideholes within the intrahepatic ductal system, and position a drain across the lesion into the duodenum. Plan for repeat through the tube cholangiogram and drain exchange after her initial recovery in a few days to a week. Electronically Signed   By: Corrie Mckusick D.O.   On: 03/25/2017 09:58    ASSESSMENT / PLAN:   38. 71 yo female with abnormal liver chemistries, CBD duct dilation. 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 She is s/p perc drain by IR. Biopsies pending. If unrevealing then may need repeat EGD with biopsies or may just proceed with EUS.   2. New AFib / flutter with RVR. Cardiology following  3. Heartburn. -will start Protonix  Principal Problem:   SIRS (systemic inflammatory response syndrome) (HCC) Active Problems:   Right leg DVT (HCC)   RA (rheumatoid arthritis) (HCC)   Iron deficiency anemia   Choledocholithiasis   Cholangitis   Epigastric pain   Duodenal mass    LOS: 5 days   Peggy Armstrong ,NP 03/27/2017, 8:55 AM  Pager number [phone]     Attending physician's note   I have taken an interval history, reviewed the chart and examined the patient. I agree with the Advanced Practitioner's note, impression and recommendations. Biliary drainage is working well - IR managing. Awaiting  biopsies of duodenal mass. If biopsies negative may need repeat EGD. May need EUS to further evaluate, stage - will review with Dr. Ardis Hughs. Adding PPI for heartburn. Surgical consult to evaluate options for the mass.   Lucio Edward, MD [facility] [phone] Mon-Fri 8a-5p [phone] after  5p, weekends, holidays

## 2017-03-27 NOTE — Consult Note (Signed)
[facility] Surgery Consult/Admission Note  Peggy Armstrong 07/13/1945  [id number].    Requesting MD: Tye Savoy, GI Chief Complaint/Reason for Consult: duodenal mass  HPI:   Pt is a 71 year old female with a history of iron deficiency anemia, DVT on Eliquis, HTN, who presented to the ED with complaints of progressively worsening, severe, epigastric abdominal pain, with nausea and vomiting that began last Wednesday (09/26). Pain, nausea and vomiting has resolved, she is currently comfortable. Pt states a history of heartburn. She has had decreased appetite, chills followed by night sweats, and fatigue for the last few months. Bowel movements have been normal. She is being treated for iron deficiency anemia by Dr. Alvy Bimler. Pt denies HA, SOB, CP, fevers, blood in her stool, melena, hematochezia, diarrhea, weight loss, urinary symptoms.   Pt was found to have elevated LFT's, mild intra and extrahepatic ductal dilation. IR placed a biliary drain and preformed a cholangiogram which showed Tortuous extrahepatic biliary ductal system with obstruction the distal common bile duct. ERCP was performed which showed a partially obstructing polypoid and ulcerated mass in the second portion of the duodenum and the major papilla could not be visualized. Biopsy revealed adenoma. GI plans an EUS this Thursday.    ROS:  Review of Systems  Constitutional: Positive for chills, diaphoresis and malaise/fatigue. Negative for fever and weight loss.  Respiratory: Negative for shortness of breath.   Cardiovascular: Positive for leg swelling (chronic). Negative for chest pain.  Gastrointestinal: Positive for abdominal pain, nausea and vomiting. Negative for blood in stool, constipation, diarrhea and melena.  Genitourinary: Negative for dysuria and hematuria.  Skin: Negative for rash.  Neurological: Negative for dizziness, focal weakness and loss of consciousness.  All other systems reviewed and are  negative.    Family History  Problem Relation Age of Onset  . Cancer Father        prostate ca    Past Medical History:  Diagnosis Date  . Clotting disorder ([place])    right DVT  . Gout   . Gout   . Hypertension   . RA (rheumatoid arthritis) ([place])     Past Surgical History:  Procedure Laterality Date  . ABDOMINAL HYSTERECTOMY    . CHOLECYSTECTOMY    . ESOPHAGOGASTRODUODENOSCOPY (EGD) WITH PROPOFOL N/A 03/24/2017   Procedure: ESOPHAGOGASTRODUODENOSCOPY (EGD) WITH PROPOFOL;  Surgeon: Doran Stabler, MD;  Location: [facility] ENDOSCOPY;  Service: Gastroenterology;  Laterality: N/A;  . HERNIA REPAIR    . IR BILIARY DRAIN PLACEMENT WITH CHOLANGIOGRAM  03/25/2017    Social History:  reports that she has never smoked. She has never used smokeless tobacco. She reports that she does not drink alcohol or use drugs.  Allergies:  Allergies  Allergen Reactions  . Codeine Nausea And Vomiting    Made "very sick"    Medications Prior to Admission  Medication Sig Dispense Refill  . albuterol (PROVENTIL HFA;VENTOLIN HFA) 108 (90 Base) MCG/ACT inhaler Inhale 2 puffs into the lungs every 6 (six) hours as needed for wheezing or shortness of breath.     . allopurinol (ZYLOPRIM) 300 MG tablet Take 300 mg by mouth daily.     Marland Kitchen ALPRAZolam (XANAX) 0.25 MG tablet Take 0.25 mg by mouth at bedtime.   2  . apixaban (ELIQUIS) 2.5 MG TABS tablet Take 2.5 mg by mouth 2 (two) times daily.    Marland Kitchen atorvastatin (LIPITOR) 10 MG tablet Take 10 mg by mouth daily.  3  . chlorthalidone (HYGROTON) 25 MG tablet Take  25 mg by mouth daily.    . cholecalciferol (VITAMIN D) 1000 units tablet Take 1,000 Units by mouth daily.    . cloNIDine (CATAPRES) 0.2 MG tablet Take 0.2 mg by mouth 2 (two) times daily.    Marland Kitchen diltiazem (CARDIZEM CD) 300 MG 24 hr capsule Take 300 mg by mouth daily.    Marland Kitchen lisinopril (PRINIVIL,ZESTRIL) 10 MG tablet Take 10 mg by mouth daily.    . vitamin B-12 (CYANOCOBALAMIN) 1000 MCG tablet Take 1,000 mcg by  mouth daily.      Blood pressure 125/68, pulse 84, temperature 97.7 F (36.5 C), temperature source Oral, resp. rate (!) 23, height '5\' 1"'  (1.549 m), weight 241 lb 6.5 oz (109.5 kg), SpO2 93 %.  Physical Exam  Constitutional: She is oriented to person, place, and time and well-developed, well-nourished, and in no distress. Vital signs are normal. No distress.  HENT:  Head: Normocephalic and atraumatic.  Nose: Nose normal.  Mouth/Throat: Oropharynx is clear and moist. No oropharyngeal exudate.  Eyes: Pupils are equal, round, and reactive to light. Conjunctivae and EOM are normal. Right eye exhibits no discharge. Left eye exhibits no discharge. No scleral icterus.  Neck: Normal range of motion. Neck supple. No tracheal deviation present. No thyromegaly present.  Cardiovascular: Normal rate, regular rhythm, normal heart sounds and intact distal pulses.  Exam reveals no gallop and no friction rub.   No murmur heard. Pulses:      Radial pulses are 2+ on the right side, and 2+ on the left side.       Dorsalis pedis pulses are 2+ on the right side, and 2+ on the left side.  Pulmonary/Chest: Effort normal and breath sounds normal. No respiratory distress. She has no decreased breath sounds. She has no wheezes. She has no rhonchi. She has no rales.  Abdominal: Soft. Bowel sounds are normal. She exhibits no distension and no mass. There is no tenderness. There is no rebound and no guarding. No hernia.  Perc drain in place and site dressed  Musculoskeletal: Normal range of motion. She exhibits edema (mild of BLE). She exhibits no deformity.  Lymphadenopathy:    She has no cervical adenopathy.  Neurological: She is alert and oriented to person, place, and time.  Skin: Skin is warm and dry. No rash noted. She is not diaphoretic.  Psychiatric: Mood and affect normal.  Nursing note and vitals reviewed.   Results for orders placed or performed during the hospital encounter of 03/22/17 (from the past 48  hour(s))  Magnesium     Status: Abnormal   Collection Time: 03/25/17  2:30 PM  Result Value Ref Range   Magnesium 2.5 (H) 1.7 - 2.4 mg/dL  Glucose, capillary     Status: Abnormal   Collection Time: 03/25/17  6:11 PM  Result Value Ref Range   Glucose-Capillary 136 (H) 65 - 99 mg/dL   Comment 1 Notify RN   Glucose, capillary     Status: Abnormal   Collection Time: 03/26/17 12:08 AM  Result Value Ref Range   Glucose-Capillary 109 (H) 65 - 99 mg/dL  MRSA PCR Screening     Status: None   Collection Time: 03/26/17  1:12 AM  Result Value Ref Range   MRSA by PCR NEGATIVE NEGATIVE    Comment:        The GeneXpert MRSA Assay (FDA approved for NASAL specimens only), is one component of a comprehensive MRSA colonization surveillance program. It is not intended to diagnose MRSA infection nor  to guide or monitor treatment for MRSA infections.   CBC     Status: Abnormal   Collection Time: 03/26/17  1:49 AM  Result Value Ref Range   WBC 14.0 (H) 4.0 - 10.5 K/uL   RBC 4.17 3.87 - 5.11 MIL/uL   Hemoglobin 10.6 (L) 12.0 - 15.0 g/dL   HCT 34.8 (L) 36.0 - 46.0 %   MCV 83.5 78.0 - 100.0 fL   MCH 25.4 (L) 26.0 - 34.0 pg   MCHC 30.5 30.0 - 36.0 g/dL   RDW 18.3 (H) 11.5 - 15.5 %   Platelets 186 150 - 400 K/uL  Comprehensive metabolic panel     Status: Abnormal   Collection Time: 03/26/17  1:49 AM  Result Value Ref Range   Sodium 138 135 - 145 mmol/L   Potassium 2.6 (LL) 3.5 - 5.1 mmol/L    Comment: DELTA CHECK NOTED CRITICAL RESULT CALLED TO, READ BACK BY AND VERIFIED WITH: Q MBENEMA AT 0228 ON 09.30.2018 BY NBROOKS    Chloride 95 (L) 101 - 111 mmol/L   CO2 32 22 - 32 mmol/L   Glucose, Bld 101 (H) 65 - 99 mg/dL   BUN 16 6 - 20 mg/dL   Creatinine, Ser 1.43 (H) 0.44 - 1.00 mg/dL   Calcium 8.5 (L) 8.9 - 10.3 mg/dL   Total Protein 6.4 (L) 6.5 - 8.1 g/dL   Albumin 2.8 (L) 3.5 - 5.0 g/dL   AST 33 15 - 41 U/L   ALT 74 (H) 14 - 54 U/L   Alkaline Phosphatase 188 (H) 38 - 126 U/L   Total  Bilirubin 1.2 0.3 - 1.2 mg/dL   GFR calc non Af Amer 36 (L) >60 mL/min   GFR calc Af Amer 42 (L) >60 mL/min    Comment: (NOTE) The eGFR has been calculated using the CKD EPI equation. This calculation has not been validated in all clinical situations. eGFR's persistently <60 mL/min signify possible Chronic Kidney Disease.    Anion gap 11 5 - 15  Magnesium     Status: None   Collection Time: 03/26/17  1:49 AM  Result Value Ref Range   Magnesium 1.8 1.7 - 2.4 mg/dL  Glucose, capillary     Status: Abnormal   Collection Time: 03/26/17  7:56 AM  Result Value Ref Range   Glucose-Capillary 119 (H) 65 - 99 mg/dL   Comment 1 Notify RN    Comment 2 Document in Chart   Culture, blood (routine x 2)     Status: None (Preliminary result)   Collection Time: 03/26/17  8:05 AM  Result Value Ref Range   Specimen Description BLOOD RIGHT ARM    Special Requests IN PEDIATRIC BOTTLE Blood Culture adequate volume    Culture      NO GROWTH < 24 HOURS Performed at [facility], [address] [place], [place] [address]    Report Status PENDING   Culture, blood (routine x 2)     Status: None (Preliminary result)   Collection Time: 03/26/17  8:10 AM  Result Value Ref Range   Specimen Description BLOOD RIGHT ARM    Special Requests IN PEDIATRIC BOTTLE Blood Culture adequate volume    Culture      NO GROWTH < 24 HOURS Performed at [facility] [address] [place],  [address]    Report Status PENDING   Basic metabolic panel     Status: Abnormal   Collection Time: 03/26/17  8:10 AM  Result Value Ref Range   Sodium 136 135 - 145 mmol/L   Potassium 3.5 3.5 - 5.1 mmol/L    Comment: DELTA CHECK NOTED   Chloride 98 (L) 101 - 111 mmol/L   CO2 29 22 - 32 mmol/L   Glucose, Bld 106 (H) 65 - 99 mg/dL   BUN 15 6 - 20 mg/dL   Creatinine, Ser 1.29 (H) 0.44 - 1.00 mg/dL   Calcium 8.5 (L) 8.9 - 10.3 mg/dL   GFR calc non Af Amer 41 (L) >60 mL/min   GFR calc Af Amer 47 (L) >60 mL/min     Comment: (NOTE) The eGFR has been calculated using the CKD EPI equation. This calculation has not been validated in all clinical situations. eGFR's persistently <60 mL/min signify possible Chronic Kidney Disease.    Anion gap 9 5 - 15  TSH     Status: None   Collection Time: 03/26/17 12:34 PM  Result Value Ref Range   TSH 4.457 0.350 - 4.500 uIU/mL    Comment: Performed by a 3rd Generation assay with a functional sensitivity of <=0.01 uIU/mL.  Glucose, capillary     Status: Abnormal   Collection Time: 03/26/17  1:29 PM  Result Value Ref Range   Glucose-Capillary 116 (H) 65 - 99 mg/dL  Glucose, capillary     Status: Abnormal   Collection Time: 03/26/17  4:00 PM  Result Value Ref Range   Glucose-Capillary 117 (H) 65 - 99 mg/dL  Glucose, capillary     Status: Abnormal   Collection Time: 03/26/17 11:12 PM  Result Value Ref Range   Glucose-Capillary 105 (H) 65 - 99 mg/dL  CBC     Status: Abnormal   Collection Time: 03/27/17  3:20 AM  Result Value Ref Range   WBC 11.3 (H) 4.0 - 10.5 K/uL   RBC 4.09 3.87 - 5.11 MIL/uL   Hemoglobin 10.4 (L) 12.0 - 15.0 g/dL   HCT 33.9 (L) 36.0 - 46.0 %   MCV 82.9 78.0 - 100.0 fL   MCH 25.4 (L) 26.0 - 34.0 pg   MCHC 30.7 30.0 - 36.0 g/dL   RDW 18.2 (H) 11.5 - 15.5 %   Platelets 212 150 - 400 K/uL  Basic metabolic panel     Status: Abnormal   Collection Time: 03/27/17  3:20 AM  Result Value Ref Range   Sodium 138 135 - 145 mmol/L   Potassium 3.2 (L) 3.5 - 5.1 mmol/L   Chloride 98 (L) 101 - 111 mmol/L   CO2 29 22 - 32 mmol/L   Glucose, Bld 90 65 - 99 mg/dL   BUN 18 6 - 20 mg/dL   Creatinine, Ser 1.39 (H) 0.44 - 1.00 mg/dL   Calcium 8.4 (L) 8.9 - 10.3 mg/dL   GFR calc non Af Amer 37 (L) >60 mL/min   GFR calc Af Amer 43 (L) >60 mL/min    Comment: (NOTE) The eGFR has been calculated using the CKD EPI equation. This calculation has not been validated in all clinical situations. eGFR's persistently <60 mL/min signify possible Chronic  Kidney Disease.    Anion gap 11 5 - 15  Magnesium     Status: None   Collection Time: 03/27/17  3:20 AM  Result Value Ref Range   Magnesium 2.2 1.7 - 2.4 mg/dL  Glucose, capillary     Status: Abnormal   Collection Time: 03/27/17  5:53 AM  Result Value Ref Range   Glucose-Capillary 123 (H) 65 - 99 mg/dL  Comment 1 Notify RN   Glucose, capillary     Status: Abnormal   Collection Time: 03/27/17 11:26 AM  Result Value Ref Range   Glucose-Capillary 135 (H) 65 - 99 mg/dL   No results found.    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 We will continue to follow. Thank you for the consult   Kalman Drape, [facility] Surgery 03/27/2017, 1:58 PM Pager: [phone] Consults: [phone] Mon-Fri 7:00 am-4:30 pm Sat-Sun 7:00 am-11:30 am

## 2017-03-27 NOTE — Progress Notes (Signed)
Progress Note  Patient Name: Peggy Armstrong Date of Encounter: 03/27/2017  Primary Cardiologist: new ([place])  Subjective   She feels well. No further nausea and vomiting. Denies abdominal pain, chest pain or shortness of breath. Converted to sinus rhythm last night. Most of the day has been normal sinus at 85-90 bpm, while I was examining her had a roughly 1 minute episode of probable atrial flutter with 2:1 AV block and ventricular rate 140 bpm. Spontaneously returned to sinus rhythm. Still on amiodarone drip. Still hypokalemic potassium 3.2. 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  Inpatient Medications    Scheduled Meds: . allopurinol  300 mg Oral Daily  . amiodarone  150 mg Intravenous Once  . indomethacin  50 mg Rectal Once  . levalbuterol  1.25 mg Nebulization TID  . pantoprazole  40 mg Oral Daily  . polyethylene glycol  17 g Oral Daily  . potassium chloride  40 mEq Oral Once  . senna-docusate  1 tablet Oral BID  . sodium chloride flush  5 mL Intravenous Q8H   Continuous Infusions: . amiodarone 30 mg/hr (03/27/17 1524)  . cefTRIAXone (ROCEPHIN)  IV Stopped (03/26/17 2209)   PRN Meds: acetaminophen **OR** acetaminophen, benzocaine, hydrALAZINE, HYDROmorphone (DILAUDID) injection, ketorolac, metoprolol tartrate, ondansetron **OR** ondansetron (ZOFRAN) IV, oxyCODONE, traMADol   Vital Signs    Vitals:   03/27/17 1352 03/27/17 1400 03/27/17 1500 03/27/17 1600  BP:  127/78  139/72  Pulse:  89 91 90  Resp:  16 (!) 27 19  Temp:    98.5 F (36.9 C)  TempSrc:    Oral  SpO2: 93% 94% 93% 92%  Weight:      Height:        Intake/Output Summary (Last 24 hours) at 03/27/17 1737 Last data filed at 03/27/17 1600  Gross per 24 hour  Intake           844.79 ml  Output              985 ml  Net          -140.21 ml    Filed Weights   03/25/17 0641 03/26/17 0103 03/27/17 0430  Weight: 253 lb 8.5 oz (115 kg) 241 lb 13.5 oz (109.7 kg) 241 lb 6.5 oz (109.5 kg)    Telemetry    Sinus rhythm with a 1 minute episode of paroxysmal atrial flutter with 2:1 AV block - Personally Reviewed  ECG    Ectopic atrial tachycardia versus atrial flutter with 2:1 AV block - Personally Reviewed  Physical Exam  ooks comfortable, calm. Obesity limits exam GEN: No acute distress.   Neck: No JVD Cardiac: RRR, no murmurs, rubs, or gallops.  Respiratory: Clear to auscultation bilaterally. GI: Soft, nontender, non-distended  MS: No edema; No deformity. Neuro:  Nonfocal  Psych: Normal affect   Labs    Chemistry Recent Labs Lab 03/24/17 0544  03/25/17 0623 03/26/17 0149 03/26/17 0810 03/27/17 0320  NA 139  --  136 138 136 138  K 2.9*  < > 3.2* 2.6* 3.5 3.2*  CL 103  --  99* 95* 98* 98*  CO2 25  --  28 32 29 29  GLUCOSE 114*  --  105* 101* 106* 90  BUN 18  --  16 16 15 18   CREATININE 1.43*  --  1.45* 1.43* 1.29* 1.39*  CALCIUM 8.4*  --  8.1* 8.5* 8.5* 8.4*  PROT 6.4*  --  5.6* 6.4*  --   --   ALBUMIN 3.0*  --  2.5* 2.8*  --   --   AST 139*  --  65* 33  --   --   ALT 148*  --  99* 74*  --   --   ALKPHOS 267*  --  200* 188*  --   --   BILITOT 3.4*  --  1.8* 1.2  --   --   GFRNONAA 36*  --  35* 36* 41* 37*  GFRAA 42*  --  41* 42* 47* 43*  ANIONGAP 11  --  9 11 9 11   < > = values in this interval not displayed.   Hematology Recent Labs Lab 03/25/17 0623 03/26/17 0149 03/27/17 0320  WBC 13.2* 14.0* 11.3*  RBC 3.92 4.17 4.09  HGB 10.1* 10.6* 10.4*  HCT 32.8* 34.8* 33.9*  MCV 83.7 83.5 82.9  MCH 25.8* 25.4* 25.4*  MCHC 30.8 30.5 30.7  RDW 18.5* 18.3* 18.2*  PLT 120* 186 212    Radiology    No results found.  Cardiac Studies   Echo ordered, pending  Patient Profile     71 y.o. female with new onset atrial flutter with 2:1 AV block in the setting of sepsis/cholangitis, hypokalemia.  Previously on Eliquis for DVT, anticoagulation on hold for biopsy of duodenal mass, to be repeated with ultrasound guidance on Thursday.  Assessment & Plan    1. Atrial flutter:I was about recommend discontinuation of amiodarone IV, when she had another 1 minute episode of arrhythmia. We'll keep on the amiodarone intravenously overnight. Plan to switch to oral amiodarone tomorrow morning, if no arrhythmia recurrence. Continue potassium replenishment. She reports being told that she will remain on lifelong anticoagulation for DVT anyway. Echo ordered. 2. 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 She is now off anticoagulants. Recommend prophylactic doses of subcutaneous heparin or enoxaparin until she has her repeat biopsy on Thursday. 3. Duodenal mass/ascending cholangitis: repeat biopsy on Thursday.   For questions or updates, please contact [facility] Please consult [email or web address] for contact info under Cardiology/STEMI.      Signed, Sanda Klein, MD  03/27/2017, 5:37 PM

## 2017-03-27 NOTE — Progress Notes (Signed)
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 Given appearance and size we are still concerned for underlying malignancy. Additional biopsies needed. Plan is for upper EUS with biopsy on Thursday by Dr. Ardis Hughs. Additionally, we have consulted Surgery.

## 2017-03-27 NOTE — Care Management Note (Signed)
Case Management Note  Patient Details  Name: Peggy Armstrong MRN: [id number] Date of Birth: [birth year]  Subjective/Objective:                  71 y.o.femalewith history of DVT, rheumatoid arthritis, hypertension presented to the ER with epigastric pain associated with nausea and non bloody vomiting, poor oral intake. CT abd in ED notable for mild intra and extrahepatic biliary ductal dilation. GI consulted. Iv Amiodarone started  Major events since admission: 9/28 - ERCP, please see results below 9/29 - overnight, a-fib, a-futter, not responding to Metoprolol and Cardizem, pt moved from tele unit to SDU  9/30 - remains with HR in 150's, cardiology consulted   Action/Plan: Date:  March 27, 2017 Chart reviewed for concurrent status and case management needs.  Will continue to follow patient progress.  Discharge Planning: following for needs  Expected discharge date: March 30, 2017  Velva Harman, BSN, [place], [place]   Expected Discharge Date:                  Expected Discharge Plan:  Home/Self Care  In-House Referral:     Discharge planning Services  CM Consult  Post Acute Care Choice:    Choice offered to:     DME Arranged:    DME Agency:     HH Arranged:    HH Agency:     Status of Service:  In process, will continue to follow  If discussed at Long Length of Stay Meetings, dates discussed:    Additional Comments:  Leeroy Cha, RN 03/27/2017, 9:52 AM

## 2017-03-27 NOTE — Progress Notes (Signed)
ANTICOAGULATION CONSULT NOTE - Initial Consult  Pharmacy Consult for enoxaparin Indication: VTE prophylaxis  Allergies  Allergen Reactions  . Codeine Nausea And Vomiting    Made "very sick"    Patient Measurements: Height: 5\' 1"  (154.9 cm) Weight: 241 lb 6.5 oz (109.5 kg) IBW/kg (Calculated) : 47.8 Heparin Dosing Weight:   Vital Signs: Temp: 98.5 F (36.9 C) (10/01 1600) Temp Source: Oral (10/01 1600) BP: 152/102 (10/01 1800) Pulse Rate: 93 (10/01 1800)  Labs:  Recent Labs  03/25/17 0623 03/26/17 0149 03/26/17 0810 03/27/17 0320  HGB 10.1* 10.6*  --  10.4*  HCT 32.8* 34.8*  --  33.9*  PLT 120* 186  --  212  CREATININE 1.45* 1.43* 1.29* 1.39*    Estimated Creatinine Clearance: 42.5 mL/min (A) (by C-G formula based on SCr of 1.39 mg/dL (H)).   Medical History: Past Medical History:  Diagnosis Date  . Clotting disorder ([place])    right DVT  . Gout   . Gout   . Hypertension   . RA (rheumatoid arthritis) (HCC)     Medications:  Scheduled:  . allopurinol  300 mg Oral Daily  . amiodarone  150 mg Intravenous Once  . enoxaparin (LOVENOX) injection  55 mg Subcutaneous Q24H  . indomethacin  50 mg Rectal Once  . levalbuterol  1.25 mg Nebulization TID  . pantoprazole  40 mg Oral Daily  . polyethylene glycol  17 g Oral Daily  . senna-docusate  1 tablet Oral BID  . sodium chloride flush  5 mL Intravenous Q8H    Assessment: Pharmacy is consulted to dose enoxaparin in 71 yo female for VTE prophylaxis. Pt was on apixaban 2.5 mg PO BID PTA , which is currently on hold for upcoming biopsy of duodenum planned for Thursday.     Goal of Therapy:  Anti-Xa level 0.6-1 units/ml 4hrs after LMWH dose given Monitor platelets by anticoagulation protocol: Yes   Plan:   Enoxaparin 55 mg [place] q24h ( 0.5 mg/kg daily)   Monitor for signs and symptoms of bleeding    Royetta Asal, PharmD, BCPS Pager [phone] 03/27/2017 7:04 PM

## 2017-03-28 ENCOUNTER — Inpatient Hospital Stay (HOSPITAL_COMMUNITY): Payer: Medicare Other

## 2017-03-28 DIAGNOSIS — I4892 Unspecified atrial flutter: Secondary | ICD-10-CM

## 2017-03-28 LAB — CBC
HEMATOCRIT: 31.6 % — AB (ref 36.0–46.0)
HEMOGLOBIN: 9.8 g/dL — AB (ref 12.0–15.0)
MCH: 25.5 pg — AB (ref 26.0–34.0)
MCHC: 31 g/dL (ref 30.0–36.0)
MCV: 82.3 fL (ref 78.0–100.0)
PLATELETS: 220 10*3/uL (ref 150–400)
RBC: 3.84 MIL/uL — AB (ref 3.87–5.11)
RDW: 18.6 % — ABNORMAL HIGH (ref 11.5–15.5)
WBC: 9.6 10*3/uL (ref 4.0–10.5)

## 2017-03-28 LAB — COMPREHENSIVE METABOLIC PANEL
ALBUMIN: 2.6 g/dL — AB (ref 3.5–5.0)
ALK PHOS: 143 U/L — AB (ref 38–126)
ALT: 32 U/L (ref 14–54)
ANION GAP: 10 (ref 5–15)
AST: 16 U/L (ref 15–41)
BUN: 18 mg/dL (ref 6–20)
CALCIUM: 8.2 mg/dL — AB (ref 8.9–10.3)
CHLORIDE: 99 mmol/L — AB (ref 101–111)
CO2: 28 mmol/L (ref 22–32)
Creatinine, Ser: 1.22 mg/dL — ABNORMAL HIGH (ref 0.44–1.00)
GFR calc Af Amer: 50 mL/min — ABNORMAL LOW (ref >60)
GFR calc non Af Amer: 43 mL/min — ABNORMAL LOW (ref >60)
GLUCOSE: 87 mg/dL (ref 65–99)
Potassium: 3.2 mmol/L — ABNORMAL LOW (ref 3.5–5.1)
Sodium: 137 mmol/L (ref 135–145)
Total Bilirubin: 0.9 mg/dL (ref 0.3–1.2)
Total Protein: 6 g/dL — ABNORMAL LOW (ref 6.5–8.1)

## 2017-03-28 LAB — GLUCOSE, CAPILLARY
GLUCOSE-CAPILLARY: 92 mg/dL (ref 65–99)
Glucose-Capillary: 96 mg/dL (ref 65–99)
Glucose-Capillary: 103 mg/dL — ABNORMAL HIGH (ref 65–99)

## 2017-03-28 LAB — CULTURE, BLOOD (ROUTINE X 2)
CULTURE: NO GROWTH
Special Requests: ADEQUATE

## 2017-03-28 LAB — ECHOCARDIOGRAM COMPLETE
HEIGHTINCHES: 61 in
Weight: 3862.46 oz

## 2017-03-28 MED ORDER — LEVALBUTEROL HCL 1.25 MG/0.5ML IN NEBU: 1.2500 mg | INHALATION_SOLUTION | Freq: Two times a day (BID) | RESPIRATORY_TRACT | Status: DC

## 2017-03-28 MED ORDER — ENSURE ENLIVE PO LIQD
237.0000 mL | Freq: Two times a day (BID) | ORAL | Status: DC
  Administered 2017-03-29 (×2): 237 mL via ORAL

## 2017-03-28 MED ORDER — POTASSIUM CHLORIDE 10 MEQ/100ML IV SOLN
10.0000 meq | INTRAVENOUS | Status: AC
  Administered 2017-03-28 (×2): 10 meq via INTRAVENOUS
  Filled 2017-03-28 (×2): qty 100

## 2017-03-28 MED ORDER — AMIODARONE HCL 200 MG PO TABS
400.0000 mg | ORAL_TABLET | Freq: Two times a day (BID) | ORAL | Status: DC
  Administered 2017-03-28 – 2017-03-30 (×5): 400 mg via ORAL
  Filled 2017-03-28 (×6): qty 2

## 2017-03-28 MED ORDER — LEVALBUTEROL HCL 0.63 MG/3ML IN NEBU
0.6300 mg | INHALATION_SOLUTION | Freq: Four times a day (QID) | RESPIRATORY_TRACT | Status: DC | PRN
  Administered 2017-03-28 – 2017-03-29 (×2): 0.63 mg via RESPIRATORY_TRACT
  Filled 2017-03-28 (×2): qty 3

## 2017-03-28 MED ORDER — POTASSIUM CHLORIDE CRYS ER 20 MEQ PO TBCR
40.0000 meq | EXTENDED_RELEASE_TABLET | Freq: Once | ORAL | Status: AC
  Administered 2017-03-28: 40 meq via ORAL
  Filled 2017-03-28: qty 2

## 2017-03-28 NOTE — Progress Notes (Signed)
    Progress Note   Subjective   No abdominal pain. Heartburn resolving. Poor appetite.    Objective  Vital signs in last 24 hours: Temp:  [97.7 F (36.5 C)-100.4 F (38 C)] 99.3 F (37.4 C) (10/02 0800) Pulse Rate:  [82-101] 84 (10/02 0600) Resp:  [15-32] 25 (10/02 0532) BP: (118-152)/(55-102) 138/59 (10/02 0600) SpO2:  [91 %-98 %] 97 % (10/02 0800) Last BM Date: 03/22/17  General: Alert, well-developed, in NAD Heart:  Regular rate and rhythm; no murmurs Chest: Clear to ascultation bilaterally Abdomen:  Soft, nontender and nondistended. Normal bowel sounds, without guarding, and without rebound.   Extremities:  Without edema. Neurologic:  Alert and  oriented x4; grossly normal neurologically. Psych:  Alert and cooperative. Normal mood and affect.  Intake/Output from previous day: 10/01 0701 - 10/02 0700 In: 1454.8 [P.O.:780; I.V.:514.8; IV Piggyback:150] Out: 1910 [Urine:875; Drains:1035] Intake/Output this shift: Total I/O In: -  Out: 150 [Urine:150]  Lab Results:  Recent Labs  03/26/17 0149 03/27/17 0320 03/28/17 0316  WBC 14.0* 11.3* 9.6  HGB 10.6* 10.4* 9.8*  HCT 34.8* 33.9* 31.6*  PLT 186 212 220   BMET  Recent Labs  03/26/17 0810 03/27/17 0320 03/28/17 0316  NA 136 138 137  K 3.5 3.2* 3.2*  CL 98* 98* 99*  CO2 29 29 28   GLUCOSE 106* 90 87  BUN 15 18 18   CREATININE 1.29* 1.39* 1.22*  CALCIUM 8.5* 8.4* 8.2*   LFT  Recent Labs  03/28/17 0316  PROT 6.0*  ALBUMIN 2.6*  AST 16  ALT 32  ALKPHOS 143*  BILITOT 0.9      Assessment & Plan   1. Biliary obstruction by duodenal mass.  LFTs improving. Obstruction adequately managed. Mass was adenoma on pathology. For EUS on Thursday with Dr. Ardis Hughs. Surgery following for duodenal mass mgmt options. IR managing biliary stents, drains.    2. Cholangitis with bacteremia, resolving. Continue IV antibiotics.  3. GERD. Heartburn improved. Continue PPI.   4. New afib.   Pricilla Riffle. Fuller Plan MD   03/28/2017, 11:22 AM

## 2017-03-28 NOTE — Progress Notes (Addendum)
Patient ID: Peggy Armstrong, female   DOB: 06-Mar-1946, 71 y.o.   MRN: [id number]    PROGRESS NOTE  Peggy Armstrong  ZHY:[id number] DOB: Jan 20, 1946 DOA: 03/22/2017  PCP: Thurman Coyer, MD   Brief Narrative:  71 y.o. female with history of DVT, rheumatoid arthritis, hypertension presented to the ER with epigastric pain associated with nausea and non bloody vomiting, poor oral intake. CT abd in ED notable for mild intra and extrahepatic biliary ductal dilation. GI consulted.  Major events since admission: 09/28 - ERCP, please see results below 09/29 - overnight, a-fib, a-futter, not responding to Metoprolol and Cardizem, pt moved from tele unit to SDU  09/30 - remains with HR in 150's, cardiology consulted  10/02 - in NSR, transfer from SDU to [place]:   Principal Problem:   Sepsis due to ascending cholangitis, klebsiella bacteremia, duodenal mass - MRCP notable for dilated CBD and left intrahepatic biliary tree - ERCP 9/28 notable for duodenal mass, causing high grade biliary obstruction, biopsied - pt also underwent percutaneous cholangiogram and ext drain placed 9/28 - continue rocephin for Klebsiella bacteremia and follow up on sensitivity report - repeat blood cultures obtained 9/30, so far NGTD - LFT's trending down, WBC is now WNL  - so far, pt tolerating diet well  - appreciate GI team following, plan for EUS on Thursday, cont to hold Eliquis   Active Problems:   A-fib, flutter with RVR 9/29 - 9/30 - has not responded to Cardizem or metoprolol initially  - Cardiology assistance is greatly appreciated - Has been on amiodarone IV but now transitioned to 400 mg by mouth twice a day    Right leg DVT ([place]) - Eliquis has been on hold and would continue to hold as additional interventions may be needed  - Placed on prophylactic Lovenox in the meantime    Acute kidney injury - appears to be pre renal in etiology in the setting of acute illness - Overall  improving, will repeat BMP in the morning    Dyspnea - some crackles on exam and wheezing 9/29 - Still congested, I will ask for chest x-ray for clear evaluation - Oxygen saturation is stable and at target range    Hypokalemia - Still low, continue to supplement    RA (rheumatoid arthritis) ([place]) - outpatient follow up     Iron deficiency anemia - pt reports intermittent bleeding in the past requiring transfusions, last iron infusion was about two weeks ago - hg is slightly down in the past 24 hours but no evidence of active bleeding - CBC In AM    Thrombocytopenia - suspect reactive from the above procedures and acute illness - Resolved    Morbid obesity  - Body mass index is 46.24 kg/m  DVT prophylaxis: Eliquis has been on hold, started prophylactic Lovenox Code Status: Full  Family Communication: Patient and family at bedside Disposition Plan: To be determined, for now we can transfer to telemetry unit  Consultants:   GI  Cardiology  IR  Surgery   Procedures:   MRCP 9/27 - results below   ERCP 9/28 - mass in the second portion of the duodenum causing high grade biliary obstruction   Percutaneous cholangiogram and external biliary drain placed 9/28   Antimicrobials:   Zosyn 9/26 --> 9/27   Ceftriaxone 9/27 -->  Subjective: Patient reports feeling better.  Objective: Vitals:   03/28/17 0532 03/28/17 0600 03/28/17 0800 03/28/17 1200  BP:  (!) 138/59  Pulse: 82 84    Resp: (!) 25     Temp:   99.3 F (37.4 C) 100.3 F (37.9 C)  TempSrc:   Oral Oral  SpO2: 97% 98% 97%   Weight:      Height:        Intake/Output Summary (Last 24 hours) at 03/28/17 1220 Last data filed at 03/28/17 1100  Gross per 24 hour  Intake          1454.83 ml  Output             2051 ml  Net          -596.17 ml   Filed Weights   03/25/17 0641 03/26/17 0103 03/27/17 0430  Weight: 115 kg (253 lb 8.5 oz) 109.7 kg (241 lb 13.5 oz) 109.5 kg (241 lb 6.5 oz)   Physical  Exam  Constitutional: Appears Calm, not in acute distress CVS: RRR, S1/S2 +, no gallops, no carotid bruit.  Pulmonary: Rhonchi at bases, diminished breath sounds at bases Abdominal: Soft. BS +,  no distension, tenderness, rebound or guarding.  Neuro: Alert. Normal reflexes, muscle tone coordination. No cranial nerve deficit. Skin: Skin is warm and dry. No rash noted. Not diaphoretic. No erythema. No pallor.  Psychiatric: Normal mood and affect. Behavior, judgment, thought content normal.   Data Reviewed: I have personally reviewed following labs and imaging studies  CBC:  Recent Labs Lab 03/22/17 1712 03/23/17 0236 03/24/17 0544 03/25/17 2706 03/26/17 0149 03/27/17 0320 03/28/17 0316  WBC 16.9* 24.2* 12.5* 13.2* 14.0* 11.3* 9.6  NEUTROABS 16.2* 22.1*  --   --   --   --   --   HGB 10.9* 10.2* 10.6* 10.1* 10.6* 10.4* 9.8*  HCT 34.9* 32.5* 34.1* 32.8* 34.8* 33.9* 31.6*  MCV 82.3 83.1 82.0 83.7 83.5 82.9 82.3  PLT 262 210 171 120* 186 212 237   Basic Metabolic Panel:  Recent Labs Lab 03/24/17 0544  03/25/17 0623 03/25/17 1430 03/26/17 0149 03/26/17 0810 03/27/17 0320 03/28/17 0316  NA 139  --  136  --  138 136 138 137  K 2.9*  < > 3.2*  --  2.6* 3.5 3.2* 3.2*  CL 103  --  99*  --  95* 98* 98* 99*  CO2 25  --  28  --  32 29 29 28   GLUCOSE 114*  --  105*  --  101* 106* 90 87  BUN 18  --  16  --  16 15 18 18   CREATININE 1.43*  --  1.45*  --  1.43* 1.29* 1.39* 1.22*  CALCIUM 8.4*  --  8.1*  --  8.5* 8.5* 8.4* 8.2*  MG 1.5*  --   --  2.5* 1.8  --  2.2  --   < > = values in this interval not displayed. Liver Function Tests:  Recent Labs Lab 03/23/17 0236 03/24/17 0544 03/25/17 0623 03/26/17 0149 03/28/17 0316  AST 318* 139* 65* 33 16  ALT 232* 148* 99* 74* 32  ALKPHOS 302* 267* 200* 188* 143*  BILITOT 3.2* 3.4* 1.8* 1.2 0.9  PROT 6.4* 6.4* 5.6* 6.4* 6.0*  ALBUMIN 3.1* 3.0* 2.5* 2.8* 2.6*    Recent Labs Lab 03/22/17 1530  LIPASE 37   Coagulation  Profile:  Recent Labs Lab 03/23/17 0121 03/24/17 0717  INR 1.52 1.30   CBG:  Recent Labs Lab 03/27/17 1126 03/27/17 1805 03/27/17 2307 03/28/17 0611 03/28/17 1140  GLUCAP 135* 108* 120* 96 103*   Urine  analysis:    Component Value Date/Time   COLORURINE AMBER (A) 03/22/2017 1530   APPEARANCEUR HAZY (A) 03/22/2017 1530   LABSPEC 1.017 03/22/2017 1530   PHURINE 5.0 03/22/2017 1530   GLUCOSEU NEGATIVE 03/22/2017 1530   HGBUR NEGATIVE 03/22/2017 1530   BILIRUBINUR NEGATIVE 03/22/2017 [place] 03/22/2017 1530   PROTEINUR NEGATIVE 03/22/2017 1530   NITRITE NEGATIVE 03/22/2017 1530   [place] 03/22/2017 1530   Radiology Studies: No results found. Scheduled Meds: . allopurinol  300 mg Oral Daily  . amiodarone  400 mg Oral BID  . enoxaparin (LOVENOX) injection  55 mg Subcutaneous Q24H  . feeding supplement (ENSURE ENLIVE)  237 mL Oral BID BM  . indomethacin  50 mg Rectal Once  . levalbuterol  1.25 mg Nebulization TID  . pantoprazole  40 mg Oral Daily  . polyethylene glycol  17 g Oral Daily  . senna-docusate  1 tablet Oral BID  . sodium chloride flush  5 mL Intravenous Q8H   Continuous Infusions: . cefTRIAXone (ROCEPHIN)  IV Stopped (03/27/17 2145)    LOS: 6 days   Time spent: 25 minutes   Faye Ramsay, MD [facility] Pager [phone]  If 7PM-7AM, please contact night-coverage [email or web address] Password [email or web address] 03/28/2017, 12:20 PM

## 2017-03-28 NOTE — Progress Notes (Signed)
Patient transferred from ICU.  Agree with previous RN's assessment of patient.  Patient stable at this time with family at bedside.  Oriented to unit and equipment, placed on telemetry and confirmed with CMT.  Will continue to monitor.

## 2017-03-28 NOTE — Progress Notes (Signed)
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 We will continue to follow.     LOS: 6 days    Subjective: CC: cough  Pt is not having abdominal pain. She is having flatus. No nausea or vomiting. Tolerating diet.   Objective: Vital signs in last 24 hours: Temp:  [97.7 F (36.5 C)-100.4 F (38 C)] 100.4 F (38 C) (10/02 0300) Pulse Rate:  [82-101] 84 (10/02 0600) Resp:  [15-32] 25 (10/02 0532) BP: (118-152)/(55-102) 138/59 (10/02 0600) SpO2:  [91 %-98 %] 97 % (10/02 0800) Last BM Date: 03/22/17  Intake/Output from previous day: 10/01 0701 - 10/02 0700 In: 1454.8 [P.O.:780; I.V.:514.8; IV Piggyback:150] Out: 1910 [Urine:875; Drains:1035] Intake/Output this shift: No intake/output data recorded.  PE: Gen:  Alert, NAD, pleasant, cooperative Card:  RRR, no M/G/R heard Pulm:  Rate and effort normal Abd: Soft, NT/ND, +BS, no hernia appreciated, perc drain in place and site dressed, drainage is bilious Skin: no rashes noted, warm and dry   Anti-infectives: Anti-infectives    Start     Dose/Rate Route Frequency Ordered Stop   03/24/17 1530  piperacillin-tazobactam (ZOSYN) IVPB 3.375 g     3.375 g 12.5 mL/hr over 240 Minutes Intravenous  Once 03/24/17 1515 03/24/17  2036   03/23/17 2345  cefTRIAXone (ROCEPHIN) 2 g in dextrose 5 % 50 mL IVPB     2 g 100 mL/hr over 30 Minutes Intravenous Daily at bedtime 03/23/17 2335     03/23/17 0600  piperacillin-tazobactam (ZOSYN) IVPB 3.375 g  Status:  Discontinued     3.375 g 12.5 mL/hr over 240 Minutes Intravenous Every 8 hours 03/23/17 0417 03/23/17 2335   03/22/17 2215  piperacillin-tazobactam (ZOSYN) IVPB 3.375 g     3.375 g 100 mL/hr over 30 Minutes Intravenous  Once 03/22/17 2206 03/23/17 0211      Lab Results:   Recent Labs  03/27/17 0320 03/28/17 0316  WBC 11.3* 9.6  HGB 10.4* 9.8*  HCT 33.9* 31.6*  PLT 212 220   BMET  Recent Labs  03/27/17 0320 03/28/17 0316  NA 138 137  K 3.2* 3.2*  CL 98* 99*  CO2 29 28  GLUCOSE 90 87  BUN 18 18  CREATININE 1.39* 1.22*  CALCIUM 8.4* 8.2*   PT/INR No results for input(s): LABPROT, INR in the last 72 hours. CMP     Component Value Date/Time   NA 137 03/28/2017 0316   NA 142 08/11/2016 0936   K 3.2 (L) 03/28/2017 0316   K 3.4 (L) 08/11/2016 0936   CL 99 (L) 03/28/2017 0316   CO2 28 03/28/2017 0316   CO2 25 08/11/2016 0936   GLUCOSE 87 03/28/2017 0316   GLUCOSE 119 08/11/2016 0936   BUN 18 03/28/2017 0316   BUN 23.3 08/11/2016 0936   CREATININE 1.22 (H) 03/28/2017 0316  CREATININE 1.3 (H) 08/11/2016 0936   CALCIUM 8.2 (L) 03/28/2017 0316   CALCIUM 9.6 08/11/2016 0936   PROT 6.0 (L) 03/28/2017 0316   PROT 6.8 08/11/2016 0936   ALBUMIN 2.6 (L) 03/28/2017 0316   ALBUMIN 3.3 (L) 08/11/2016 0936   AST 16 03/28/2017 0316   AST 16 08/11/2016 0936   ALT 32 03/28/2017 0316   ALT 18 08/11/2016 0936   ALKPHOS 143 (H) 03/28/2017 0316   ALKPHOS 121 08/11/2016 0936   BILITOT 0.9 03/28/2017 0316   BILITOT 0.36 08/11/2016 0936   GFRNONAA 43 (L) 03/28/2017 0316   GFRAA 50 (L) 03/28/2017 0316   Lipase     Component Value Date/Time   LIPASE 37 03/22/2017 1530    Studies/Results: No results found.    Kalman Drape , [facility] 03/28/2017, 8:10 AM Pager: [phone] Consults: [phone] Mon-Fri 7:00 am-4:30 pm Sat-Sun 7:00 am-11:30 am

## 2017-03-28 NOTE — Progress Notes (Addendum)
  Echocardiogram 2D Echocardiogram has been performed.  Patient experiencing skin sensitivity during length of exam. Due to drain in RUQ, unable to accurately image IVC collapse.   Peggy Armstrong 03/28/2017, 12:42 PM

## 2017-03-28 NOTE — Progress Notes (Signed)
Progress Note  Patient Name: Peggy Armstrong Date of Encounter: 03/28/2017  Primary Cardiologist: New to [facility] - Seen initially by Dr. Harl Bowie but lives in [place], [place]  Subjective   No recurrent chest pain or palpitations. Breathing at baseline. Sat in the chair for several hours yesterday.   Inpatient Medications    Scheduled Meds: . allopurinol  300 mg Oral Daily  . amiodarone  150 mg Intravenous Once  . enoxaparin (LOVENOX) injection  55 mg Subcutaneous Q24H  . feeding supplement (ENSURE ENLIVE)  237 mL Oral BID BM  . indomethacin  50 mg Rectal Once  . levalbuterol  1.25 mg Nebulization TID  . pantoprazole  40 mg Oral Daily  . polyethylene glycol  17 g Oral Daily  . potassium chloride  40 mEq Oral Once  . senna-docusate  1 tablet Oral BID  . sodium chloride flush  5 mL Intravenous Q8H   Continuous Infusions: . amiodarone 30 mg/hr (03/28/17 0406)  . cefTRIAXone (ROCEPHIN)  IV Stopped (03/27/17 2145)   PRN Meds: acetaminophen **OR** acetaminophen, benzocaine, hydrALAZINE, HYDROmorphone (DILAUDID) injection, ketorolac, metoprolol tartrate, ondansetron **OR** ondansetron (ZOFRAN) IV, oxyCODONE, traMADol   Vital Signs    Vitals:   03/28/17 0500 03/28/17 0532 03/28/17 0600 03/28/17 0800  BP:   (!) 138/59   Pulse: 88 82 84   Resp: (!) 23 (!) 25    Temp:    99.3 F (37.4 C)  TempSrc:    Oral  SpO2: 96% 97% 98% 97%  Weight:      Height:        Intake/Output Summary (Last 24 hours) at 03/28/17 0933 Last data filed at 03/28/17 3976  Gross per 24 hour  Intake          1454.83 ml  Output             1750 ml  Net          -295.17 ml   Filed Weights   03/25/17 0641 03/26/17 0103 03/27/17 0430  Weight: 253 lb 8.5 oz (115 kg) 241 lb 13.5 oz (109.7 kg) 241 lb 6.5 oz (109.5 kg)    Telemetry    NSR, HR in 80's - 90's. No ectopic events. - Personally Reviewed  ECG    No new tracings.   Physical Exam   General: Well developed, overweight Caucasian female  appearing in no acute distress. Head: Normocephalic, atraumatic.  Neck: Supple without bruits, JVD difficult to assess secondary to body habitus. Lungs:  Resp regular and unlabored, CTA without wheezing or rales. Heart: RRR, S1, S2, no S3, S4, or murmur; no rub. Abdomen: Soft, non-tender, non-distended with normoactive bowel sounds. No hepatomegaly. No rebound/guarding. No obvious abdominal masses. Extremities: No clubbing, cyanosis, or edema. Distal pedal pulses are 2+ bilaterally. Neuro: Alert and oriented X 3. Moves all extremities spontaneously. Psych: Normal affect.  Labs    Chemistry Recent Labs Lab 03/25/17 [phone] 03/26/17 0149 03/26/17 0810 03/27/17 0320 03/28/17 0316  NA 136 138 136 138 137  K 3.2* 2.6* 3.5 3.2* 3.2*  CL 99* 95* 98* 98* 99*  CO2 28 32 29 29 28   GLUCOSE 105* 101* 106* 90 87  BUN 16 16 15 18 18   CREATININE 1.45* 1.43* 1.29* 1.39* 1.22*  CALCIUM 8.1* 8.5* 8.5* 8.4* 8.2*  PROT 5.6* 6.4*  --   --  6.0*  ALBUMIN 2.5* 2.8*  --   --  2.6*  AST 65* 33  --   --  16  ALT  99* 74*  --   --  32  ALKPHOS 200* 188*  --   --  143*  BILITOT 1.8* 1.2  --   --  0.9  GFRNONAA 35* 36* 41* 37* 43*  GFRAA 41* 42* 47* 43* 50*  ANIONGAP 9 11 9 11 10      Hematology Recent Labs Lab 03/26/17 0149 03/27/17 0320 03/28/17 0316  WBC 14.0* 11.3* 9.6  RBC 4.17 4.09 3.84*  HGB 10.6* 10.4* 9.8*  HCT 34.8* 33.9* 31.6*  MCV 83.5 82.9 82.3  MCH 25.4* 25.4* 25.5*  MCHC 30.5 30.7 31.0  RDW 18.3* 18.2* 18.6*  PLT 186 212 220    Cardiac EnzymesNo results for input(s): TROPONINI in the last 168 hours. No results for input(s): TROPIPOC in the last 168 hours.   BNPNo results for input(s): BNP, PROBNP in the last 168 hours.   DDimer No results for input(s): DDIMER in the last 168 hours.   Radiology    No results found.  Cardiac Studies   Echocardiogram: Pending  Patient Profile     71 y.o. female w/ PMH of DVT (on Eliquis), RA, and HTN admitted for sepsis felt to be  secondary to ascending cholangitis and Klebsiella bacteremia. Cardiology consulted for atrial fibrillation with RVR.  Assessment & Plan    1. New Onset Atrial Flutter - admitted for sepsis secondary to ascending cholangitis and noted to have gone into atrial flutter with RVR, likely triggered by her acute illness.  - she had been started on IV Amiodarone secondary to hypotension and converted to SR overnight on 9/30 with a brief episode of PAF on 10/1. Has maintained NSR since by review of telemetry. Currently still on IV Amiodarone 30mg /hr. Will switch to PO Amiodarone 400mg  BID.   2. Prior DVT - on Eliquis PTA due to the episode being unprovoked. Has been started on full-dose Lovenox until her procedures are performed.   3. Duodenal Mass/ Ascending Cholangitis - underwent cholangiogram and drain placement on 9/28. Plan is for EUS on Thursday with biopsy at that time.  - GI and Surgery following.   4. Hypokalemia - K+ 3.2 this AM.  - supplementation has been ordered by the admitting team.   Signed, [name] , PA-C 9:33 AM 03/28/2017 Pager: [phone]  I have seen and examined the patient along with [name] , PA-C.  I have reviewed the chart, notes and new data.  I agree with PA/NP's note.  Key new complaints: feels well, stronger, no CV complaints Key examination changes: no signs of CHF, RRR Key new findings / data: K 3.2  PLAN: Echo being done right now. Amiodarone for about 30 days. Resume Eliquis when all GI procedures are completed. Reevaluate burden of atrial arrhythmia with 30 day event monitor after we stop amiodarone. If atrial flutter is also seen beyond electrolyte abnormalities/critical illness, can refer for ablation.  Sanda Klein, MD, [place] [phone] 03/28/2017, 12:18 PM

## 2017-03-29 ENCOUNTER — Other Ambulatory Visit: Payer: Self-pay | Admitting: Radiology

## 2017-03-29 ENCOUNTER — Telehealth: Payer: Self-pay

## 2017-03-29 ENCOUNTER — Other Ambulatory Visit: Payer: Self-pay

## 2017-03-29 ENCOUNTER — Inpatient Hospital Stay (HOSPITAL_COMMUNITY): Payer: Medicare Other

## 2017-03-29 DIAGNOSIS — D132 Benign neoplasm of duodenum: Secondary | ICD-10-CM

## 2017-03-29 DIAGNOSIS — K831 Obstruction of bile duct: Secondary | ICD-10-CM

## 2017-03-29 LAB — CBC
HEMATOCRIT: 33.4 % — AB (ref 36.0–46.0)
HEMOGLOBIN: 10 g/dL — AB (ref 12.0–15.0)
MCH: 25.1 pg — ABNORMAL LOW (ref 26.0–34.0)
MCHC: 29.9 g/dL — AB (ref 30.0–36.0)
MCV: 83.9 fL (ref 78.0–100.0)
Platelets: 198 10*3/uL (ref 150–400)
RBC: 3.98 MIL/uL (ref 3.87–5.11)
RDW: 18.6 % — AB (ref 11.5–15.5)
WBC: 7.9 10*3/uL (ref 4.0–10.5)

## 2017-03-29 LAB — COMPREHENSIVE METABOLIC PANEL
ALT: 27 U/L (ref 14–54)
ANION GAP: 11 (ref 5–15)
AST: 22 U/L (ref 15–41)
Albumin: 2.8 g/dL — ABNORMAL LOW (ref 3.5–5.0)
Alkaline Phosphatase: 133 U/L — ABNORMAL HIGH (ref 38–126)
BUN: 16 mg/dL (ref 6–20)
CALCIUM: 8.2 mg/dL — AB (ref 8.9–10.3)
CHLORIDE: 100 mmol/L — AB (ref 101–111)
CO2: 27 mmol/L (ref 22–32)
CREATININE: 1.17 mg/dL — AB (ref 0.44–1.00)
GFR, EST AFRICAN AMERICAN: 53 mL/min — AB (ref >60)
GFR, EST NON AFRICAN AMERICAN: 46 mL/min — AB (ref >60)
Glucose, Bld: 92 mg/dL (ref 65–99)
Potassium: 3.9 mmol/L (ref 3.5–5.1)
SODIUM: 138 mmol/L (ref 135–145)
Total Bilirubin: 1 mg/dL (ref 0.3–1.2)
Total Protein: 6.1 g/dL — ABNORMAL LOW (ref 6.5–8.1)

## 2017-03-29 LAB — STREP PNEUMONIAE URINARY ANTIGEN: STREP PNEUMO URINARY ANTIGEN: NEGATIVE

## 2017-03-29 LAB — MAGNESIUM: MAGNESIUM: 1.8 mg/dL (ref 1.7–2.4)

## 2017-03-29 LAB — GLUCOSE, CAPILLARY
GLUCOSE-CAPILLARY: 97 mg/dL (ref 65–99)
GLUCOSE-CAPILLARY: 100 mg/dL — AB (ref 65–99)
Glucose-Capillary: 92 mg/dL (ref 65–99)
Glucose-Capillary: 96 mg/dL (ref 65–99)

## 2017-03-29 LAB — EXPECTORATED SPUTUM ASSESSMENT W REFEX TO RESP CULTURE

## 2017-03-29 LAB — EXPECTORATED SPUTUM ASSESSMENT W GRAM STAIN, RFLX TO RESP C

## 2017-03-29 MED ORDER — SODIUM CHLORIDE 0.9 % IV SOLN
INTRAVENOUS | Status: DC
  Administered 2017-03-29: 20:00:00 via INTRAVENOUS

## 2017-03-29 MED ORDER — DILTIAZEM HCL 25 MG/5ML IV SOLN
10.0000 mg | Freq: Once | INTRAVENOUS | Status: AC
  Administered 2017-03-29: 10 mg via INTRAVENOUS
  Filled 2017-03-29: qty 5

## 2017-03-29 MED ORDER — VANCOMYCIN HCL 10 G IV SOLR
1250.0000 mg | INTRAVENOUS | Status: DC
  Administered 2017-03-30: 1250 mg via INTRAVENOUS
  Filled 2017-03-29: qty 1250

## 2017-03-29 MED ORDER — METOPROLOL TARTRATE 5 MG/5ML IV SOLN
5.0000 mg | Freq: Once | INTRAVENOUS | Status: AC
  Administered 2017-03-29: 5 mg via INTRAVENOUS
  Filled 2017-03-29: qty 5

## 2017-03-29 MED ORDER — DILTIAZEM LOAD VIA INFUSION: 10.0000 mg | Freq: Once | INTRAVENOUS | Status: DC

## 2017-03-29 MED ORDER — VANCOMYCIN HCL 10 G IV SOLR
2000.0000 mg | Freq: Once | INTRAVENOUS | Status: AC
  Administered 2017-03-29: 2000 mg via INTRAVENOUS
  Filled 2017-03-29: qty 2000

## 2017-03-29 MED ORDER — DEXTROSE 5 % IV SOLN
1.0000 g | Freq: Three times a day (TID) | INTRAVENOUS | Status: DC
  Administered 2017-03-29 – 2017-03-30 (×4): 1 g via INTRAVENOUS
  Filled 2017-03-29 (×5): qty 1

## 2017-03-29 NOTE — Progress Notes (Signed)
Patient ID: Peggy Armstrong, female   DOB: 10/17/1945, 71 y.o.   MRN: [id number]    PROGRESS NOTE  Peggy Armstrong  GEX:[id number] DOB: 1945-10-30 DOA: 03/22/2017  PCP: Thurman Coyer, MD   Brief Narrative:  71 y.o. female with history of DVT, rheumatoid arthritis, hypertension presented to the ER with epigastric pain associated with nausea and non bloody vomiting, poor oral intake. CT abd in ED notable for mild intra and extrahepatic biliary ductal dilation. GI consulted.  Major events since admission: 09/28 - ERCP, please see results below 09/29 - overnight, a-fib, a-futter, not responding to Metoprolol and Cardizem, pt moved from tele unit to SDU  09/30 - remains with HR in 150's, cardiology consulted  10/02 - in NSR, transfer from SDU to [place]:   Principal Problem:   Sepsis due to ascending cholangitis, klebsiella bacteremia, duodenal mass - MRCP notable for dilated CBD and left intrahepatic biliary tree - ERCP 9/28 notable for duodenal mass, causing high grade biliary obstruction, biopsied - pt also underwent percutaneous cholangiogram and ext drain placed 9/28 - continue rocephin for Klebsiella bacteremia and follow up on sensitivity report - repeat blood cultures obtained 9/30, so far NGTD - LFT's trending down, WBC remains WNL  - so far, pt tolerating diet well  - appreciate GI team following, plan for EUS on Thursday if no fevers - cont to hold Eliquis   Active Problems:   A-fib, flutter with RVR 9/29 - 9/30 - has not responded to Cardizem or metoprolol initially  - Per cardiology, continue oral amiodarone 400 mg twice a day through 04/05/2017 - At that time plan to reduce dose to 400 mg by mouth daily for 1 week, continue 200 mg daily until outpatient follow-up - Plan to resume anticoagulation once GI procedures have been completed     Right leg DVT ([place]) - Eliquis has been on hold and would continue to hold as additional interventions may be  needed  - Placed on prophylactic Lovenox in the meantime    Acute kidney injury - appears to be pre renal in etiology in the setting of acute illness - Overall improving, repeat BMP in the morning    Dyspnea, fevers started 03/28/2009 - chest x-ray worrisome for bibasilar pneumonia - Will broaden antibiotic coverage to vancomycin and Maxipime - Request sputum culture and narrow antibiotics as clinically indicated    Hypokalemia - Supplemented and within normal limits this morning    RA (rheumatoid arthritis) ([place]) - outpatient follow up     Iron deficiency anemia - pt reports intermittent bleeding in the past requiring transfusions, last iron infusion was about two weeks ago - Hemoglobin overall stable, CBC in the morning    Thrombocytopenia - suspect reactive from the above procedures and acute illness - Resolved    Morbid obesity  - Body mass index is 46.24 kg/m  DVT prophylaxis: Eliquis has been on hold, started prophylactic Lovenox Code Status: Full  Family Communication: Patient and family at bedside Disposition Plan: To be determined  Consultants:   GI  Cardiology  IR  Surgery   Procedures:   MRCP 9/27 - results below   ERCP 9/28 - mass in the second portion of the duodenum causing high grade biliary obstruction   Percutaneous cholangiogram and external biliary drain placed 9/28   Antimicrobials:   Zosyn 9/26 --> 9/27   Ceftriaxone 9/27 -->  Subjective: Patient reports ongoing dyspnea, mostly exertional.   Objective: Vitals:  03/29/17 0420 03/29/17 0500 03/29/17 0800 03/29/17 1414  BP: 105/70   135/62  Pulse: 90   85  Resp: 20   18  Temp: 98 F (36.7 C)  (!) 101.2 F (38.4 C) 98.1 F (36.7 C)  TempSrc: Oral  Oral Oral  SpO2: 95%   96%  Weight:  109.5 kg (241 lb 6.5 oz)    Height:        Intake/Output Summary (Last 24 hours) at 03/29/17 1717 Last data filed at 03/29/17 1400  Gross per 24 hour  Intake              295 ml  Output               775 ml  Net             -480 ml   Filed Weights   03/26/17 0103 03/27/17 0430 03/29/17 0500  Weight: 109.7 kg (241 lb 13.5 oz) 109.5 kg (241 lb 6.5 oz) 109.5 kg (241 lb 6.5 oz)   Physical Exam  Constitutional: Appears Calm, not in acute distress CVS: RRR, S1/S2 +, no murmurs, no gallops, no carotid bruit.  Pulmonary: Effort and breath sounds normal, rhonchi at bases Abdominal: Soft. BS +,  no distension, tenderness, rebound or guarding.  Musculoskeletal: Normal range of motion. No edema and no tenderness.   Data Reviewed: I have personally reviewed following labs and imaging studies  CBC:  Recent Labs Lab 03/23/17 0236  03/25/17 0623 03/26/17 0149 03/27/17 0320 03/28/17 0316 03/29/17 0459  WBC 24.2*  < > 13.2* 14.0* 11.3* 9.6 7.9  NEUTROABS 22.1*  --   --   --   --   --   --   HGB 10.2*  < > 10.1* 10.6* 10.4* 9.8* 10.0*  HCT 32.5*  < > 32.8* 34.8* 33.9* 31.6* 33.4*  MCV 83.1  < > 83.7 83.5 82.9 82.3 83.9  PLT 210  < > 120* 186 212 220 198  < > = values in this interval not displayed. Basic Metabolic Panel:  Recent Labs Lab 03/24/17 0544  03/25/17 1430 03/26/17 0149 03/26/17 0810 03/27/17 0320 03/28/17 0316 03/29/17 0459  NA 139  < >  --  138 136 138 137 138  K 2.9*  < >  --  2.6* 3.5 3.2* 3.2* 3.9  CL 103  < >  --  95* 98* 98* 99* 100*  CO2 25  < >  --  32 29 29 28 27   GLUCOSE 114*  < >  --  101* 106* 90 87 92  BUN 18  < >  --  16 15 18 18 16   CREATININE 1.43*  < >  --  1.43* 1.29* 1.39* 1.22* 1.17*  CALCIUM 8.4*  < >  --  8.5* 8.5* 8.4* 8.2* 8.2*  MG 1.5*  --  2.5* 1.8  --  2.2  --  1.8  < > = values in this interval not displayed. Liver Function Tests:  Recent Labs Lab 03/24/17 0544 03/25/17 0623 03/26/17 0149 03/28/17 0316 03/29/17 0459  AST 139* 65* 33 16 22  ALT 148* 99* 74* 32 27  ALKPHOS 267* 200* 188* 143* 133*  BILITOT 3.4* 1.8* 1.2 0.9 1.0  PROT 6.4* 5.6* 6.4* 6.0* 6.1*  ALBUMIN 3.0* 2.5* 2.8* 2.6* 2.8*   Coagulation  Profile:  Recent Labs Lab 03/23/17 0121 03/24/17 0717  INR 1.52 1.30   CBG:  Recent Labs Lab 03/28/17 2340 03/29/17 0515 03/29/17 0753 03/29/17 1155  03/29/17 1658  GLUCAP 92 92 97 96 100*   Urine analysis:    Component Value Date/Time   COLORURINE AMBER (A) 03/22/2017 1530   APPEARANCEUR HAZY (A) 03/22/2017 1530   LABSPEC 1.017 03/22/2017 1530   PHURINE 5.0 03/22/2017 1530   GLUCOSEU NEGATIVE 03/22/2017 1530   HGBUR NEGATIVE 03/22/2017 1530   BILIRUBINUR NEGATIVE 03/22/2017 1530   KETONESUR NEGATIVE 03/22/2017 1530   PROTEINUR NEGATIVE 03/22/2017 1530   NITRITE NEGATIVE 03/22/2017 [place] 03/22/2017 1530   Radiology Studies: Dg Chest 2 View  Result Date: 03/28/2017 CLINICAL DATA:  Dyspnea and weakness EXAM: CHEST  2 VIEW COMPARISON:  None in PACs FINDINGS: The lungs are mildly hypoinflated. The lung markings are coarse at both bases greatest on the left. There are small bilateral pleural effusions. Cardiac silhouette is enlarged. The pulmonary vascularity is not clearly engorged. There is calcification in the wall of the aortic arch. There is multilevel degenerative disc disease of the thoracic spine. IMPRESSION: Mild cardiomegaly without significant pulmonary vascular congestion. Bibasilar atelectasis or pneumonia with small bilateral pleural effusions. Thoracic aortic atherosclerosis. Electronically Signed   By: David  Martinique M.D.   On: 03/28/2017 13:19   Ct Chest Wo Contrast  Result Date: 03/29/2017 CLINICAL DATA:  71 year old female with fever of unknown origin. Shortness of breath. Intermittent cough. Cholangitis status post percutaneous transhepatic external drainage on 03/24/2017. EXAM: CT CHEST WITHOUT CONTRAST TECHNIQUE: Multidetector CT imaging of the chest was performed following the standard protocol without IV contrast. COMPARISON:  Chest radiographs 03/28/2017. Abdomen MRI 03/23/2017. CT Abdomen and Pelvis 03/22/2017. FINDINGS:  Cardiovascular: Stable cardiomegaly. Calcified coronary artery and aortic atherosclerosis is evident. Vascular patency is not evaluated in the absence of IV contrast. Mild enlargement of the central pulmonary arteries. No pathologic pericardial effusion. Mediastinum/Nodes: 3 cm rounded area of Low-density (16 Hounsfield units) in the mediastinum at the anterior carina space might be an unusual accumulation of fluid in the superior pericardial recess. Other mediastinal lymph nodes appear within normal limits. No definite hilar lymphadenopathy. Mostly fat containing hiatal hernia is stable from the recent abdomen CT. Lungs/Pleura: New small to moderate size layering bilateral pleural effusions since 03/23/2017. Major airways are patent. There is linear atelectasis in the left upper lobe, and compressive atelectasis in both lower lobes with minimal air bronchograms. Lung parenchyma elsewhere is within normal limits. Upper Abdomen: Left lobe approach percutaneous biliary drain is partially visible. No upper abdominal free fluid or free air. The visible intrahepatic bile ducts now appear decompressed. Otherwise stable visualized upper abdominal viscera. Musculoskeletal: No acute osseous abnormality identified. IMPRESSION: 1. Small to moderate layering bilateral pleural effusions are new since 03/23/2017. Superimposed pulmonary atelectasis with no definite pneumonia. 2. Rounded but fluid density 3 cm area in the mediastinum anterior to the carina is favored to be pericardial fluid in a superior pericardial recess (normal variant). A follow-up Chest CT with IV contrast (such as in 3 months time) is recommended to document stability. 3. Partially visible upper abdomen transhepatic biliary drain with no adverse features. 4. Cardiomegaly. Electronically Signed   By: Genevie Ann M.D.   On: 03/29/2017 14:48   Scheduled Meds: . allopurinol  300 mg Oral Daily  . amiodarone  400 mg Oral BID  . feeding supplement (ENSURE ENLIVE)   237 mL Oral BID BM  . pantoprazole  40 mg Oral Daily  . polyethylene glycol  17 g Oral Daily  . senna-docusate  1 tablet Oral BID  . sodium chloride flush  5 mL Intravenous Q8H   Continuous Infusions: . ceFEPime (MAXIPIME) IV    . [START ON 03/30/2017] vancomycin      LOS: 7 days   Time spent: 25 minutes   Faye Ramsay, MD [facility] Pager [phone]  If 7PM-7AM, please contact night-coverage [email or web address] Password [email or web address] 03/29/2017, 5:17 PM

## 2017-03-29 NOTE — Progress Notes (Signed)
Pharmacy Antibiotic Note  Peggy Armstrong is a 71 y.o. female admitted on 03/22/2017 with jaundice, nausea and vomiting .  Pharmacy has been consulted for vancomycin dosing for HCAP.  Antibiotic day #7.  Zosyn>>Rocepin>>vanc/cefeipme.  WBC WNL, temp 101.2, increase cough, 10/2 CXR: bibasilar atelectasis for PNA w/ small B pleural effusions.   Plan: Vancomycin 2 gm loading dose Vancomycin 1250 mg IV every 24 hours.  Goal trough 15-20 mcg/mL.  Cefepime 1 gm IV q8h per MD F/u renal function, WBC, temp, culture data, CXR Vancomycin trough as needed  Height: 5\' 1"  (154.9 cm) Weight: 241 lb 6.5 oz (109.5 kg) IBW/kg (Calculated) : 47.8  Temp (24hrs), Avg:100 F (37.8 C), Min:98 F (36.7 C), Max:101.2 F (38.4 C)   Recent Labs Lab 03/23/17 0121 03/23/17 0236  03/25/17 4917 03/26/17 0149 03/26/17 0810 03/27/17 0320 03/28/17 0316 03/29/17 0459  WBC  --  24.2*  < > 13.2* 14.0*  --  11.3* 9.6 7.9  CREATININE  --  1.48*  < > 1.45* 1.43* 1.29* 1.39* 1.22* 1.17*  LATICACIDVEN 2.0* 1.6  --   --   --   --   --   --   --   < > = values in this interval not displayed.  Estimated Creatinine Clearance: 50.5 mL/min (A) (by C-G formula based on SCr of 1.17 mg/dL (H)).    Allergies  Allergen Reactions  . Codeine Nausea And Vomiting    Made "very sick"  Antimicrobials this admission:  9/27 zosyn>>9/28 9/28 CTX>>10/3 10/3 cefepime >> 10/3 vanc >>  Dose adjustments this admission:   Microbiology results:  9/27 BCx: 1/2 klebsiella pneumo (R= amp) 9/30 BCx x2: ngtd 9/30 MRSA PCR: neg 10/1 Bile: NGTD 10/2 Bcx1: NGTD 10/3 BCx2: 10/3 sputum: 10/3 strep Uag>>   Thank you for allowing pharmacy to be a part of this patient's care.  Eudelia Bunch, Pharm.D. [phone] 03/29/2017 10:45 AM

## 2017-03-29 NOTE — Progress Notes (Signed)
Pt's HR currently 144, on call Dr. Hal Hope notified and verbally ordered 10mg  IV cardizem and lactic acid to be drawn. This RN will administer IV cardizem as soon as pharmacy verifies medication. This RN will continue to monitor pt.

## 2017-03-29 NOTE — Progress Notes (Signed)
Pt's IV infiltrated. Pt is difficult stick, IV team at bedside recommends PICC line. Will continue to monitor.

## 2017-03-29 NOTE — Progress Notes (Signed)
[facility] Progress Note  Chief Complaint:    Biliary obstruction  Subjective: Productive cough, otherwise feels okay. No abdominal pain. Not really SOB except when moving about.   Objective:  Vital signs in last 24 hours: Temp:  [98 F (36.7 C)-101.2 F (38.4 C)] 101.2 F (38.4 C) (10/03 0800) Pulse Rate:  [90-107] 90 (10/03 0420) Resp:  [20-34] 20 (10/03 0420) BP: (105-156)/(69-81) 105/70 (10/03 0420) SpO2:  [94 %-95 %] 95 % (10/03 0420) Weight:  [241 lb 6.5 oz (109.5 kg)] 241 lb 6.5 oz (109.5 kg) (10/03 0500) Last BM Date: 03/28/17 General:   Alert, obese, white female PNA in NAD EENT:  Normal hearing, non icteric sclera, conjunctive pink.  Heart:  Regular rate and rhythm, trace BLE edema Pulm: Normal respiratory effort, coughing. A few bibasilar crackles and occas wheeze.  Abdomen:  Soft, nondistended, nontender.  Normal bowel sounds, biliary drainage non-purulent.  Neurologic:  Alert and  oriented x4;  grossly normal neurologically. Psych:  Pleasant, cooperative.  Normal mood and affect.   Intake/Output from previous day: 10/02 0701 - 10/03 0700 In: 145.7 [I.V.:90.7; IV Piggyback:50] Out: 1401 [Urine:750; Drains:650; Stool:1] Intake/Output this shift: No intake/output data recorded.  Lab Results:  Recent Labs  03/27/17 0320 03/28/17 0316 03/29/17 0459  WBC 11.3* 9.6 7.9  HGB 10.4* 9.8* 10.0*  HCT 33.9* 31.6* 33.4*  PLT 212 220 198   BMET  Recent Labs  03/27/17 0320 03/28/17 0316 03/29/17 0459  NA 138 137 138  K 3.2* 3.2* 3.9  CL 98* 99* 100*  CO2 '29 28 27  ' GLUCOSE 90 87 92  BUN '18 18 16  ' CREATININE 1.39* 1.22* 1.17*  CALCIUM 8.4* 8.2* 8.2*   LFT  Recent Labs  03/29/17 0459  PROT 6.1*  ALBUMIN 2.8*  AST 22  ALT 27  ALKPHOS 133*  BILITOT 1.0   Dg Chest 2 View  Result Date: 03/28/2017 CLINICAL DATA:  Dyspnea and weakness EXAM: CHEST  2 VIEW COMPARISON:  None in PACs FINDINGS: The lungs are mildly hypoinflated. The  lung markings are coarse at both bases greatest on the left. There are small bilateral pleural effusions. Cardiac silhouette is enlarged. The pulmonary vascularity is not clearly engorged. There is calcification in the wall of the aortic arch. There is multilevel degenerative disc disease of the thoracic spine. IMPRESSION: Mild cardiomegaly without significant pulmonary vascular congestion. Bibasilar atelectasis or pneumonia with small bilateral pleural effusions. Thoracic aortic atherosclerosis. Electronically Signed   By: David  Martinique M.D.   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 She is s/p PTC. Liver chemistries nearly normal now.  -EUS with biopsies by Dr. Ardis Hughs in am. She is febrile and CXR raises concern for PNA. Only on Rocephin for the cholangitis. I discussed with Hospitalist who is already in process of writing orders for HCAP. I am concerned that fever / pna may preclude EUS in am.  -I stopped Lovenox in preparation for EUS, next dose was due tonight.   Cholangitis with Klebsiella bacteremia.  On Rocephin. WBC normal - Contact IR regarding I/E biliary drain timing.   Afib / flutter with RVR, new this admission. RRR now, HR 90's. On Amiodarone  RLE DVT, Eliquis on hold. Getting prophylactic lovenox.   AKI, improving.   Productive cough. CXR>>> bibasilar atelectasis vrs PNA. Hospitalist is activating orders for HCAP. 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 I  agree with the Advanced Practitioner's note, impression and recommendations. New fever and worsening cough - suspected HCAP which may delay EUS and I/E biliary drain. Fever is not related to biliary drainage/cholangitis. Hospitalist is aware and changing antibiotics. EUS could be rescheduled for next week as inpatient or outpatient pending course today.   Lucio Edward, MD [facility] [phone] Mon-Fri 8a-5p [phone] after 5p, weekends, holidays

## 2017-03-29 NOTE — Progress Notes (Signed)
One 13-beat nonsustained episode of atrial tachycardia seen today. Otherwise maintaining normal rhythm. Continue oral amiodarone 400 mg BID through 04/05/2017, then reduce dose to 400 mg once daily for another  week, then 200 mg daily until outpatient follow up.Resume oral anticoagulant once all GI procedures/surgeries have been completed. Will be available for additional questions, but will sign off for now. Please advise [place] of patient's discharge so we can arrange follow up.  Sanda Klein, MD, [facility] [phone] office [phone] pager

## 2017-03-29 NOTE — Telephone Encounter (Signed)
Per Nevin Bloodgood the pt has been inpatient scheduled for EUS but, developed Pneumonia and will need to be rescheduled.  I have scheduled her for 04/06/17 at 11:30 am.  She is to remain off of Eliquis and arrive at [facility] at 10 am.  She will be D/C today and will need to be called on Friday.

## 2017-03-29 NOTE — Care Management Important Message (Signed)
Important Message  Patient Details  Name: Peggy Armstrong MRN: [id number] Date of Birth: [birth year]   Medicare Important Message Given:  Yes    Kerin Salen 03/29/2017, 1:11 [place] Message  Patient Details  Name: Peggy Armstrong MRN: [id number] Date of Birth: [birth year]   Medicare Important Message Given:  Yes    Kerin Salen 03/29/2017, 1:11 PM

## 2017-03-29 NOTE — Progress Notes (Addendum)
Pt's HR at beginning of shift sustained 160s , asymptomatic and this RN gave PRN IV lopressor and scheduled PO amiodarone. Pt's HR came down to 140s but is now sustaining 140-145 and asymptomatic. On call Dr. Hal Hope notified. Awaiting orders. Will continue to monitor pt closely.   @2155  On call Dr. Hal Hope verbally ordered 5mg  IV lopressor. This medication was given by this RN. Pt's current HR 138. Will continue to monitor pt and update MD as necessary.

## 2017-03-29 NOTE — Progress Notes (Signed)
Referring Physician(s): Danis,H  Supervising Physician: Sandi Mariscal  Patient Status:  [facility] - In-pt  Chief Complaint:  Biliary obstruction, abdominal pain  Subjective: Pt feels better today than yesterday; still has occ chill with dyspnea with exertion/occ cough ; denies sig abd pain now or N/V   Allergies: Codeine  Medications: Prior to Admission medications   Medication Sig Start Date End Date Taking? Authorizing Provider  albuterol (PROVENTIL HFA;VENTOLIN HFA) 108 (90 Base) MCG/ACT inhaler Inhale 2 puffs into the lungs every 6 (six) hours as needed for wheezing or shortness of breath.  10/28/16 10/28/17 Yes [provider]  allopurinol (ZYLOPRIM) 300 MG tablet Take 300 mg by mouth daily.  05/06/15  Yes [provider]  ALPRAZolam (XANAX) 0.25 MG tablet Take 0.25 mg by mouth at bedtime.  07/21/15  Yes [provider]  apixaban (ELIQUIS) 2.5 MG TABS tablet Take 2.5 mg by mouth 2 (two) times daily.   Yes [provider]  atorvastatin (LIPITOR) 10 MG tablet Take 10 mg by mouth daily. 07/12/16  Yes [provider]  chlorthalidone (HYGROTON) 25 MG tablet Take 25 mg by mouth daily. 07/22/15  Yes [provider]  cholecalciferol (VITAMIN D) 1000 units tablet Take 1,000 Units by mouth daily.   Yes [provider]  cloNIDine (CATAPRES) 0.2 MG tablet Take 0.2 mg by mouth 2 (two) times daily. 07/22/15  Yes [provider]  diltiazem (CARDIZEM CD) 300 MG 24 hr capsule Take 300 mg by mouth daily. 07/22/15  Yes [provider]  lisinopril (PRINIVIL,ZESTRIL) 10 MG tablet Take 10 mg by mouth daily. 07/22/15  Yes [provider]  vitamin B-12 (CYANOCOBALAMIN) 1000 MCG tablet Take 1,000 mcg by mouth daily.   Yes [provider]     Vital Signs: BP 105/70 (BP Location: Left Arm)   Pulse 90   Temp (!) 101.2 F (38.4 C) (Oral)   Resp 20   Ht 5\' 1"  (1.549 m)   Wt 241 lb 6.5 oz (109.5 kg)   SpO2 95%    BMI 45.61 kg/m   Physical Exam awake/alert; SR in 90's; few bibas crackles, occ wheeze; biliary drain intact, output 125 cc; insertion site ok, not sig tender; abd soft,+BS  Imaging: Dg Chest 2 View  Result Date: 03/28/2017 CLINICAL DATA:  Dyspnea and weakness EXAM: CHEST  2 VIEW COMPARISON:  None in PACs FINDINGS: The lungs are mildly hypoinflated. The lung markings are coarse at both bases greatest on the left. There are small bilateral pleural effusions. Cardiac silhouette is enlarged. The pulmonary vascularity is not clearly engorged. There is calcification in the wall of the aortic arch. There is multilevel degenerative disc disease of the thoracic spine. IMPRESSION: Mild cardiomegaly without significant pulmonary vascular congestion. Bibasilar atelectasis or pneumonia with small bilateral pleural effusions. Thoracic aortic atherosclerosis. Electronically Signed   By: David  Martinique M.D.   On: 03/28/2017 13:19    Labs:  CBC:  Recent Labs  03/26/17 0149 03/27/17 0320 03/28/17 0316 03/29/17 0459  WBC 14.0* 11.3* 9.6 7.9  HGB 10.6* 10.4* 9.8* 10.0*  HCT 34.8* 33.9* 31.6* 33.4*  PLT 186 212 220 198    COAGS:  Recent Labs  03/23/17 0121 03/24/17 0717  INR 1.52 1.30  APTT 39*  --     BMP:  Recent Labs  03/26/17 0810 03/27/17 0320 03/28/17 0316 03/29/17 0459  NA 136 138 137 138  K 3.5 3.2* 3.2* 3.9  CL 98* 98* 99* 100*  CO2 29 29  28 27  GLUCOSE 106* 90 87 92  BUN 15 18 18 16   CALCIUM 8.5* 8.4* 8.2* 8.2*  CREATININE 1.29* 1.39* 1.22* 1.17*  GFRNONAA 41* 37* 43* 46*  GFRAA 47* 43* 50* 53*    LIVER FUNCTION TESTS:  Recent Labs  03/25/17 0623 03/26/17 0149 03/28/17 0316 03/29/17 0459  BILITOT 1.8* 1.2 0.9 1.0  AST 65* 33 16 22  ALT 99* 74* 32 27  ALKPHOS 200* 188* 143* 133*  PROT 5.6* 6.4* 6.0* 6.1*  ALBUMIN 2.5* 2.8* 2.6* 2.8*    Assessment and Plan: Pt with hx abd pain, biliary obstruction/cholangitis secondary to duodenal mass, failed ERCP  cannulation ; s/p left external biliary drain on 9/28; Tm 101.2 today; WBC nl; hgb stable at 10, creat 1.17(1.2), t bili nl, bile cx neg to date, blood cx pend; CXR with small bilat eff/atx/?PNA; duodenal mass bx- adenoma;  EUS with additional bx has been rescheduled for 10/11 as OP secondary to concern for PNA at present time; until then rec that pt cont with external biliary drain with once daily irrigation with 5 cc sterile NS (NO ASPIRATING), recording of output and dressing change every 1-2 days. Maintain adequate hydration. She will tent be scheduled for conversion to I/E biliary drain next week as OP if discharge planned within next 24-48 hrs; if pt remains inhouse then can place drain later this week as long as afebrile.  Electronically Signed: D. Rowe Robert, PA-C 03/29/2017, 1:49 PM   I spent a total of 20 minutes at the the patient's bedside AND on the patient's hospital floor or unit, greater than 50% of which was counseling/coordinating care for biliary drain    Patient ID: Peggy Armstrong, female   DOB: 1946/05/14, 71 y.o.   MRN: [id number]

## 2017-03-30 ENCOUNTER — Inpatient Hospital Stay (HOSPITAL_COMMUNITY): Payer: Medicare Other

## 2017-03-30 DIAGNOSIS — M79609 Pain in unspecified limb: Secondary | ICD-10-CM

## 2017-03-30 LAB — BODY FLUID CULTURE
CULTURE: NO GROWTH
Gram Stain: NONE SEEN

## 2017-03-30 LAB — BASIC METABOLIC PANEL
Anion gap: 7 (ref 5–15)
BUN: 14 mg/dL (ref 6–20)
CALCIUM: 8 mg/dL — AB (ref 8.9–10.3)
CO2: 28 mmol/L (ref 22–32)
CREATININE: 1.16 mg/dL — AB (ref 0.44–1.00)
Chloride: 103 mmol/L (ref 101–111)
GFR calc Af Amer: 54 mL/min — ABNORMAL LOW (ref >60)
GFR calc non Af Amer: 46 mL/min — ABNORMAL LOW (ref >60)
Glucose, Bld: 98 mg/dL (ref 65–99)
Potassium: 3.5 mmol/L (ref 3.5–5.1)
Sodium: 138 mmol/L (ref 135–145)

## 2017-03-30 LAB — CBC
HEMATOCRIT: 31.2 % — AB (ref 36.0–46.0)
Hemoglobin: 9.8 g/dL — ABNORMAL LOW (ref 12.0–15.0)
MCH: 26.3 pg (ref 26.0–34.0)
MCHC: 31.4 g/dL (ref 30.0–36.0)
MCV: 83.6 fL (ref 78.0–100.0)
Platelets: 188 10*3/uL (ref 150–400)
RBC: 3.73 MIL/uL — ABNORMAL LOW (ref 3.87–5.11)
RDW: 18.6 % — AB (ref 11.5–15.5)
WBC: 6.6 10*3/uL (ref 4.0–10.5)

## 2017-03-30 LAB — LACTIC ACID, PLASMA
LACTIC ACID, VENOUS: 1.1 mmol/L (ref 0.5–1.9)
Lactic Acid, Venous: 0.9 mmol/L (ref 0.5–1.9)

## 2017-03-30 LAB — GLUCOSE, CAPILLARY
Glucose-Capillary: 129 mg/dL — ABNORMAL HIGH (ref 65–99)
Glucose-Capillary: 145 mg/dL — ABNORMAL HIGH (ref 65–99)

## 2017-03-30 MED ORDER — LEVOFLOXACIN 500 MG PO TABS: 500.0000 mg | ORAL_TABLET | Freq: Every day | ORAL | 0 refills | Status: DC

## 2017-03-30 MED ORDER — LEVALBUTEROL HCL 0.63 MG/3ML IN NEBU: 0.6300 mg | INHALATION_SOLUTION | Freq: Four times a day (QID) | RESPIRATORY_TRACT | 12 refills | Status: DC | PRN

## 2017-03-30 MED ORDER — ONDANSETRON HCL 4 MG PO TABS: 4.0000 mg | ORAL_TABLET | Freq: Four times a day (QID) | ORAL | 0 refills | Status: DC | PRN

## 2017-03-30 MED ORDER — APIXABAN 5 MG PO TABS
5.0000 mg | ORAL_TABLET | Freq: Two times a day (BID) | ORAL | Status: DC
  Administered 2017-03-30: 5 mg via ORAL
  Filled 2017-03-30: qty 1

## 2017-03-30 MED ORDER — OXYCODONE HCL 5 MG PO TABS: 5.0000 mg | ORAL_TABLET | ORAL | 0 refills | Status: DC | PRN

## 2017-03-30 MED ORDER — POLYETHYLENE GLYCOL 3350 17 G PO PACK: 17.0000 g | PACK | Freq: Every day | ORAL | 0 refills | Status: DC

## 2017-03-30 MED ORDER — PANTOPRAZOLE SODIUM 40 MG PO TBEC: 40.0000 mg | DELAYED_RELEASE_TABLET | Freq: Every day | ORAL | 1 refills | Status: DC

## 2017-03-30 MED ORDER — AMIODARONE HCL 400 MG PO TABS: ORAL_TABLET | ORAL | 1 refills | Status: DC

## 2017-03-30 MED ORDER — SENNOSIDES-DOCUSATE SODIUM 8.6-50 MG PO TABS: 1.0000 | ORAL_TABLET | Freq: Every evening | ORAL | Status: DC | PRN

## 2017-03-30 MED ORDER — APIXABAN 5 MG PO TABS: 5.0000 mg | ORAL_TABLET | Freq: Two times a day (BID) | ORAL | 0 refills | Status: DC

## 2017-03-30 NOTE — Progress Notes (Signed)
6 Days Post-Op    CC:  Abdominal pain  Subjective: Pt says pulmonary congestion is better and if she does not have a clot in her arm she is going home tonight.  EUS to be done as an OP on 04/06/17.    Objective: Vital signs in last 24 hours: Temp:  [98.1 F (36.7 C)-99.1 F (37.3 C)] 99.1 F (37.3 C) (10/04 0551) Pulse Rate:  [75-147] 87 (10/04 0551) Resp:  [18] 18 (10/04 0551) BP: (112-135)/(62-89) 128/74 (10/04 0551) SpO2:  [96 %-100 %] 100 % (10/04 0551) Last BM Date: 03/29/17 360 Po 200 IV Urine x 1 recorded BM x 1 recorded TM 102 7 AM yesterday, no fever since that time. HR up a few times yesterday Labs stable, Creatinine 1.17 WBC 6.6    Intake/Output from previous day: 10/03 0701 - 10/04 0700 In: 528.7 [P.O.:360; I.V.:118.7; IV Piggyback:50] Out: 445 [Drains:445] Intake/Output this shift: No intake/output data recorded.  General appearance: alert, cooperative and no distress GI: soft, drain in place on a regular diet + BM  Lab Results:   Recent Labs  03/29/17 0459 03/30/17 0201  WBC 7.9 6.6  HGB 10.0* 9.8*  HCT 33.4* 31.2*  PLT 198 188    BMET  Recent Labs  03/29/17 0459 03/30/17 0201  NA 138 138  K 3.9 3.5  CL 100* 103  CO2 27 28  GLUCOSE 92 98  BUN 16 14  CREATININE 1.17* 1.16*  CALCIUM 8.2* 8.0*   PT/INR No results for input(s): LABPROT, INR in the last 72 hours.   Recent Labs Lab 03/24/17 0544 03/25/17 0623 03/26/17 0149 03/28/17 0316 03/29/17 0459  AST 139* 65* 33 16 22  ALT 148* 99* 74* 32 27  ALKPHOS 267* 200* 188* 143* 133*  BILITOT 3.4* 1.8* 1.2 0.9 1.0  PROT 6.4* 5.6* 6.4* 6.0* 6.1*  ALBUMIN 3.0* 2.5* 2.8* 2.6* 2.8*     Lipase     Component Value Date/Time   LIPASE 37 03/22/2017 1530     Medications: . allopurinol  300 mg Oral Daily  . amiodarone  400 mg Oral BID  . feeding supplement (ENSURE ENLIVE)  237 mL Oral BID BM  . pantoprazole  40 mg Oral Daily  . polyethylene glycol  17 g Oral Daily  .  senna-docusate  1 tablet Oral BID  . sodium chloride flush  5 mL Intravenous Q8H   . sodium chloride 20 mL/hr at 03/29/17 2004  . ceFEPime (MAXIPIME) IV Stopped (03/30/17 0550)  . vancomycin     Anti-infectives    Start     Dose/Rate Route Frequency Ordered Stop   03/30/17 1200  vancomycin (VANCOCIN) 1,250 mg in sodium chloride 0.9 % 250 mL IVPB     1,250 mg 166.7 mL/hr over 90 Minutes Intravenous Every 24 hours 03/29/17 1051     03/29/17 1400  ceFEPIme (MAXIPIME) 1 g in dextrose 5 % 50 mL IVPB     1 g 100 mL/hr over 30 Minutes Intravenous Every 8 hours 03/29/17 1009 04/06/17 1359   03/29/17 1100  vancomycin (VANCOCIN) 2,000 mg in sodium chloride 0.9 % 500 mL IVPB     2,000 mg 250 mL/hr over 120 Minutes Intravenous  Once 03/29/17 1020 03/29/17 1300   03/24/17 1530  piperacillin-tazobactam (ZOSYN) IVPB 3.375 g     3.375 g 12.5 mL/hr over 240 Minutes Intravenous  Once 03/24/17 1515 03/24/17 2036   03/23/17 2345  cefTRIAXone (ROCEPHIN) 2 g in dextrose 5 % 50 mL IVPB  Status:  Discontinued     2 g 100 mL/hr over 30 Minutes Intravenous Daily at bedtime 03/23/17 2335 03/29/17 1010   03/23/17 0600  piperacillin-tazobactam (ZOSYN) IVPB 3.375 g  Status:  Discontinued     3.375 g 12.5 mL/hr over 240 Minutes Intravenous Every 8 hours 03/23/17 0417 03/23/17 2335   03/22/17 2215  piperacillin-tazobactam (ZOSYN) IVPB 3.375 g     3.375 g 100 mL/hr over 30 Minutes Intravenous  Once 03/22/17 2206 03/23/17 0211      Assessment/Plan Duodenal Mass with biliary obstruction/ascending cholangitis MRCP notable for dilated CBD and left intrahepatic biliary tree - ERCP 9/28 notable for duodenal mass, causing high grade biliary obstruction, biopsied - pt also underwent percutaneous cholangiogram and ext drain placed 9/28 Bacteremia/Sepsis Klebsiella Questionable pneumonia/dyspnea and fever CT 10/3: bilateral pleural effusions/atelectasis - no definite pneumonia AF with RVR  - Eliquis - on hold DVT:   RLE RA Anemia/thrombocytopenia BMI 46 FEN: Regular diet ID:  Zosyn 9/26-28/18, Rocephin 9/27, Maxipime 10/3-10/11/18,  Vancomycin 10/3 =>> day 2 DVT:  SCD   Plan:  I told her to call the office after she has her EUS and be sure Dr. Barry Dienes knows so they can set up a follow up appointment for her.         LOS: 8 days    Peggy Armstrong 03/30/2017 [phone]

## 2017-03-30 NOTE — Progress Notes (Signed)
[facility] Progress Note  CC:  Biliary obstruction  Subjective:  Says that she feels night and day better from yesterday in regards to her cough.  Had a temp of 101.2 this AM, however.  Antibiotics switched to cefepime yesterday.  Also tachy in the 140's earlier.  Wants to go home today.  Objective:  Vital signs in last 24 hours: Temp:  [98.1 F (36.7 C)-99.1 F (37.3 C)] 99.1 F (37.3 C) (10/04 0551) Pulse Rate:  [75-147] 87 (10/04 0551) Resp:  [18] 18 (10/04 0551) BP: (112-135)/(62-89) 128/74 (10/04 0551) SpO2:  [96 %-100 %] 100 % (10/04 0551) Last BM Date: 03/29/17 General:  Alert, Well-developed, in NAD Heart:  Regular rate and rhythm; no murmurs Pulm:  Some B/L wheezing noted posteriorly. Abdomen:  Soft, non-distended.  BS present.  Non-tender.  Biliary drain noted.  Extremities:  B/L LE edema with skin changes noted. Neurologic:  Alert and oriented x 4;  grossly normal neurologically. Psych:  Alert and cooperative. Normal mood and affect.  Intake/Output from previous day: 10/03 0701 - 10/04 0700 In: 528.7 [P.O.:360; I.V.:118.7; IV Piggyback:50] Out: 445 [Drains:445]  Lab Results:  Recent Labs  03/28/17 0316 03/29/17 0459 03/30/17 0201  WBC 9.6 7.9 6.6  HGB 9.8* 10.0* 9.8*  HCT 31.6* 33.4* 31.2*  PLT 220 198 188   BMET  Recent Labs  03/28/17 0316 03/29/17 0459 03/30/17 0201  NA 137 138 138  K 3.2* 3.9 3.5  CL 99* 100* 103  CO2 '28 27 28  ' GLUCOSE 87 92 98  BUN '18 16 14  ' CREATININE 1.22* 1.17* 1.16*  CALCIUM 8.2* 8.2* 8.0*   LFT  Recent Labs  03/29/17 0459  PROT 6.1*  ALBUMIN 2.8*  AST 22  ALT 27  ALKPHOS 133*  BILITOT 1.0   Dg Chest 2 View  Result Date: 03/28/2017 CLINICAL DATA:  Dyspnea and weakness EXAM: CHEST  2 VIEW COMPARISON:  None in PACs FINDINGS: The lungs are mildly hypoinflated. The lung markings are coarse at both bases greatest on the left. There are small bilateral pleural effusions. Cardiac silhouette is  enlarged. The pulmonary vascularity is not clearly engorged. There is calcification in the wall of the aortic arch. There is multilevel degenerative disc disease of the thoracic spine. IMPRESSION: Mild cardiomegaly without significant pulmonary vascular congestion. Bibasilar atelectasis or pneumonia with small bilateral pleural effusions. Thoracic aortic atherosclerosis. Electronically Signed   By: David  Martinique M.D.   On: 03/28/2017 13:19   Ct Chest Wo Contrast  Result Date: 03/29/2017 CLINICAL DATA:  72 year old female with fever of unknown origin. Shortness of breath. Intermittent cough. Cholangitis status post percutaneous transhepatic external drainage on 03/24/2017. EXAM: CT CHEST WITHOUT CONTRAST TECHNIQUE: Multidetector CT imaging of the chest was performed following the standard protocol without IV contrast. COMPARISON:  Chest radiographs 03/28/2017. Abdomen MRI 03/23/2017. CT Abdomen and Pelvis 03/22/2017. FINDINGS: Cardiovascular: Stable cardiomegaly. Calcified coronary artery and aortic atherosclerosis is evident. Vascular patency is not evaluated in the absence of IV contrast. Mild enlargement of the central pulmonary arteries. No pathologic pericardial effusion. Mediastinum/Nodes: 3 cm rounded area of Low-density (16 Hounsfield units) in the mediastinum at the anterior carina space might be an unusual accumulation of fluid in the superior pericardial recess. Other mediastinal lymph nodes appear within normal limits. No definite hilar lymphadenopathy. Mostly fat containing hiatal hernia is stable from the recent abdomen CT. Lungs/Pleura: New small to moderate size layering bilateral pleural effusions since 03/23/2017. Major airways are patent. There is linear  atelectasis in the left upper lobe, and compressive atelectasis in both lower lobes with minimal air bronchograms. Lung parenchyma elsewhere is within normal limits. Upper Abdomen: Left lobe approach percutaneous biliary drain is partially  visible. No upper abdominal free fluid or free air. The visible intrahepatic bile ducts now appear decompressed. Otherwise stable visualized upper abdominal viscera. Musculoskeletal: No acute osseous abnormality identified. IMPRESSION: 1. Small to moderate layering bilateral pleural effusions are new since 03/23/2017. Superimposed pulmonary atelectasis with no definite pneumonia. 2. Rounded but fluid density 3 cm area in the mediastinum anterior to the carina is favored to be pericardial fluid in a superior pericardial recess (normal variant). A follow-up Chest CT with IV contrast (such as in 3 months time) is recommended to document stability. 3. Partially visible upper abdomen transhepatic biliary drain with no adverse features. 4. Cardiomegaly. Electronically Signed   By: Genevie Ann M.D.   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 She is s/p PTC. Liver chemistries nearly normal now.  -EUS with biopsies by Dr. Ardis Hughs has been rescheduled for outpatient on 10/11.  *Cholangitis with Klebsiella bacteremia.  Was on Rocephin but switched to cefepime on 10/3 for HCAP.  *Afib / flutter with RVR, new this admission.  HR in 140's this AM.  On Amiodarone.  *RLE DVT, on Eliquis as outpatient.  *AKI, improving.   *Productive cough. CXR>>> bibasilar atelectasis vrs PNA.  Now on cefepime.  Temp of 101.2 this AM.    LOS: 8 days   ZEHR, JESSICA D.  03/30/2017, 9:14 AM  Pager number [phone]     Attending physician's note   I have taken an interval history, reviewed the chart and examined the patient. I agree with the Advanced Practitioner's note, impression and recommendations.  Biliary obstruction mgmt per IR.  Cholangitis, resolved.  HCAP and afib treatment continues. EUS with biopsies on Thursday 10/11 with Dr. Ardis Hughs. If Eliquis is restarted will need to stop it 2 days prior to EUS.   GI signing off. Please call if needed.     Lucio Edward, MD [facility] [phone] Mon-Fri 8a-5p [phone] after 5p, weekends, holidays

## 2017-03-30 NOTE — Discharge Summary (Signed)
Physician Discharge Summary  Peggy Armstrong IOX:[id number] DOB: [birth year] DOA: 03/22/2017  PCP: Thurman Coyer, MD  Admit date: 03/22/2017 Discharge date: 03/30/2017  Recommendations for Outpatient Follow-up:  1. Pt will have outpatient EUS scheduled for this coming Thursday Oct 11th, 2018 2. Pt advised to stop taking eliquis on Tuesday Oct 9th, 2018, two days prior to scheduled EUS 3. Please note that dose of Eliquis was adjusted as pt also diagnosed with a-fib 4. Tentative plan at this time is to have patient undergo exchange of external biliary drain for internal/external biliary drain early next week as outpatient. Eliquis will need to be held for 48 hrs before procedure. Biliary drain should be flushed (not aspirated) once daily as outpatient, output recorded and dressing changed as needed.  5. Surgery team will arrange follow up for pt 6. 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 CT abd in ED notable for mild intra and extrahepatic biliary ductal dilation. GI consulted.  Major events since admission: 09/28 - ERCP, please see results below 09/29 - overnight, a-fib, a-futter, not responding to Metoprolol and Cardizem, pt moved from tele unit to SDU  09/30 - remains with HR in 150's, cardiology consulted  10/02 - in NSR,  transfer from SDU to [place]:   Principal Problem:   Sepsis due to ascending cholangitis, klebsiella bacteremia, duodenal mass - MRCP notable for dilated CBD and left intrahepatic biliary tree - ERCP 9/28 notable for duodenal mass, causing high grade biliary obstruction, biopsied - pt also underwent percutaneous cholangiogram and ext drain placed 9/28 - continued rocephin for Klebsiella bacteremia, pt was initially treated with Zosyn and transitioned to rocephin on 10/03 abx broadened to Vanc and Maxipime due to concern of PNA - blood culture reports indicates sensitivity to the above mentioned abx and pt has therefore completed total of 9 days of ABX for Klebsiella bacteremia, per ID doctor, this is sufficient - repeat blood cultures obtained 9/30, so far NGTD - LFT's trending down, WBC remains WNL  - so far, pt tolerating diet well   Active Problems:   A-fib, flutter with RVR 9/29 - 9/30 - has not responded to Cardizem or metoprolol initially  - Per cardiology, continue oral amiodarone 400 mg twice a day through 04/05/2017 - At that time plan to reduce dose to 400 mg by mouth daily for 1 week, continue 200 mg daily until outpatient follow-up - resume eliquis but stop two days prior to procedure     Hx of Right leg DVT ([place]), superficial vein thrombosis noted on this admission in the left cephalic vein - visualized veins of the left upper extremity appear negative for deep vein thrombosis - Superficial vein thrombosis is noted in the left cephalic vein. - will continue Eliquis as noted above     Acute kidney injury - appears to be pre  renal in etiology in the setting of acute illness - Overall improving    Dyspnea, fevers started 03/28/2009 - chest x-ray worrisome for bibasilar pneumonia - has been on broader antibiotic coverage vancomycin and Maxipime - pt wants to be discharged so will change ABX to oral ABX, can not do FQ due to interaction with  Amiodarone - will place on doxycycline for pt to complete therapy     Hypokalemia - Supplemented     RA (rheumatoid arthritis) ([place]) - outpatient follow up     Iron deficiency anemia - pt reports intermittent bleeding in the past requiring transfusions, last iron infusion was about two weeks ago - Hemoglobin overall stable    Thrombocytopenia - suspect reactive from the above procedures and acute illness - Resolved    Morbid obesity  - Body mass index is 46.24 kg/m  DVT prophylaxis: Eliquis has been on hold, started prophylactic Lovenox Code Status: Full  Family Communication: Patient and family at bedside Disposition Plan: pt wants to go home   Consultants:   GI  Cardiology  IR  Surgery   Procedures:   MRCP 9/27 - results below   ERCP 9/28 - mass in the second portion of the duodenum causing high grade biliary obstruction   Percutaneous cholangiogram and external biliary drain placed 9/28   Antimicrobials:   Zosyn 9/26 --> 9/27   Ceftriaxone 9/27 -->  Procedures/Studies: Dg Chest 2 View  Result Date: 03/28/2017 CLINICAL DATA:  Dyspnea and weakness EXAM: CHEST  2 VIEW COMPARISON:  None in PACs FINDINGS: The lungs are mildly hypoinflated. The lung markings are coarse at both bases greatest on the left. There are small bilateral pleural effusions. Cardiac silhouette is enlarged. The pulmonary vascularity is not clearly engorged. There is calcification in the wall of the aortic arch. There is multilevel degenerative disc disease of the thoracic spine. IMPRESSION: Mild cardiomegaly without significant pulmonary vascular congestion. Bibasilar atelectasis or pneumonia with small bilateral pleural effusions. Thoracic aortic atherosclerosis. Electronically Signed   By: David  Martinique M.D.   On: 03/28/2017 13:19   Ct Chest Wo Contrast  Result Date: 03/29/2017 CLINICAL DATA:  71 year old female with fever of unknown origin. Shortness of breath. Intermittent cough.  Cholangitis status post percutaneous transhepatic external drainage on 03/24/2017. EXAM: CT CHEST WITHOUT CONTRAST TECHNIQUE: Multidetector CT imaging of the chest was performed following the standard protocol without IV contrast. COMPARISON:  Chest radiographs 03/28/2017. Abdomen MRI 03/23/2017. CT Abdomen and Pelvis 03/22/2017. FINDINGS: Cardiovascular: Stable cardiomegaly. Calcified coronary artery and aortic atherosclerosis is evident. Vascular patency is not evaluated in the absence of IV contrast. Mild enlargement of the central pulmonary arteries. No pathologic pericardial effusion. Mediastinum/Nodes: 3 cm rounded area of Low-density (16 Hounsfield units) in the mediastinum at the anterior carina space might be an unusual accumulation of fluid in the superior pericardial recess. Other mediastinal lymph nodes appear within normal limits. No definite hilar lymphadenopathy. Mostly fat containing hiatal hernia is stable from the recent abdomen CT. Lungs/Pleura: New small to moderate size layering bilateral pleural effusions since 03/23/2017. Major airways are patent. There is linear atelectasis in the left upper lobe, and compressive atelectasis in both lower lobes with minimal air bronchograms. Lung parenchyma elsewhere is within normal limits. Upper Abdomen: Left lobe approach percutaneous biliary drain is partially visible. No upper abdominal free fluid or free air. The visible intrahepatic bile ducts now appear decompressed. Otherwise stable visualized upper abdominal viscera. Musculoskeletal: No acute osseous abnormality identified. IMPRESSION: 1. Small to moderate layering bilateral  pleural effusions are new since 03/23/2017. Superimposed pulmonary atelectasis with no definite pneumonia. 2. Rounded but fluid density 3 cm area in the mediastinum anterior to the carina is favored to be pericardial fluid in a superior pericardial recess (normal variant). A follow-up Chest CT with IV contrast (such as in 3  months time) is recommended to document stability. 3. Partially visible upper abdomen transhepatic biliary drain with no adverse features. 4. Cardiomegaly. Electronically Signed   By: Genevie Ann M.D.   On: 03/29/2017 14:48   Ct Abdomen Pelvis W Contrast  Result Date: 03/22/2017 CLINICAL DATA:  Mid abdominal pain and right upper quadrant pain with nausea and vomiting EXAM: CT ABDOMEN AND PELVIS WITH CONTRAST TECHNIQUE: Multidetector CT imaging of the abdomen and pelvis was performed using the standard protocol following bolus administration of intravenous contrast. CONTRAST:  43m ISOVUE-300 IOPAMIDOL (ISOVUE-300) INJECTION 61% COMPARISON:  None. FINDINGS: Lower chest: Lung bases demonstrate linear scarring. No acute consolidation or pleural effusion. Normal heart size. Hepatobiliary: Mild intra hepatic and moderate extrahepatic biliary dilatation with common duct measuring up to 18 mm. Surgical absence of the gallbladder. Pancreas: Unremarkable. No pancreatic ductal dilatation or surrounding inflammatory changes. Spleen: Normal in size without focal abnormality. Adrenals/Urinary Tract: Adrenal glands are within normal limits. Small cyst in the left kidney. No hydronephrosis. The bladder is normal Stomach/Bowel: The stomach is nonenlarged. No dilated small bowel. No colon wall thickening. Sigmoid colon diverticular disease without acute inflammation Vascular/Lymphatic: Aortic atherosclerosis. No enlarged abdominal or pelvic lymph nodes. Reproductive: Status post hysterectomy. No adnexal masses. Other: Negative for free air or free fluid. Musculoskeletal: Degenerative changes. 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 IMPRESSION: 1. Mild intra hepatic and moderate extrahepatic biliary dilatation, post cholecystectomy. Recommend correlation with laboratory values, follow-up MR or ERCP as indicated 2. Sigmoid colon diverticular disease without acute inflammation. Electronically Signed   By: KDonavan FoilM.D.   On:  03/22/2017 21:48   Mr Abdomen Mrcp Wo Contrast  Result Date: 03/23/2017 CLINICAL DATA:  Abdominal pain with nausea and vomiting. Biliary duct dilatation on CT. EXAM: MRI ABDOMEN WITHOUT CONTRAST  (INCLUDING MRCP) TECHNIQUE: Multiplanar multisequence MR imaging of the abdomen was performed. Heavily T2-weighted images of the biliary and pancreatic ducts were obtained, and three-dimensional MRCP images were rendered by post processing. COMPARISON:  Yesterday CT FINDINGS: Mild to moderate motion degradation throughout. Lower chest: Mild cardiomegaly.  Small hiatal hernia. Hepatobiliary: No focal liver lesion. Mild decreased signal on inphase imaging, suggesting iron deposition in the liver. Moderate intrahepatic biliary duct dilatation. The left hepatic duct measures 10 mm on image 81/series 4. Moderate common duct dilatation, with the common duct measuring maximally 1.6 cm in the porta hepatis on image 86/ series 4. Tapers gradually distally. No choledocholithiasis. No well-defined obstructive mass. Pancreas: Pancreatic atrophy. Duct size upper normal, including at 4 mm on image 71/ series 4. No acute pancreatitis. Spleen:  Normal in size, without focal abnormality. Adrenals/Urinary Tract: Normal adrenal glands. Mild renal cortical thinning bilaterally. Upper pole 1.6 cm left renal cyst. An interpolar 6 mm T1 hyperintense left renal lesion on image 66/series 12 is likely a complex cyst, but is incompletely characterized. Stomach/Bowel: Normal remainder of the stomach and abdominal bowel loops. Vascular/Lymphatic: Aortic and branch vessel atherosclerosis. Upper normal size porta hepatis nodes are likely reactive. Multiple small retroperitoneal nodes, none of which are pathologic by size criteria. Other:  No ascites. Musculoskeletal: No acute osseous abnormality. IMPRESSION: 1. Mild to moderately motion degraded exam. 2. Biliary duct dilatation after  cholecystectomy. No choledocholithiasis or well-defined obstructive  mass. Given the elevated bilirubin, consider further evaluation with ERCP to exclude ampullary stricture or otherwise occult ampullary lesion. 3. Hiatal hernia. 4. 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 Electronically Signed   By: Abigail Miyamoto M.D.   On: 03/23/2017 07:52   Mr 3d Recon At Scanner  Result Date: 03/23/2017 CLINICAL DATA:  Abdominal pain with nausea and vomiting. Biliary duct dilatation on CT. EXAM: MRI ABDOMEN WITHOUT CONTRAST  (INCLUDING MRCP) TECHNIQUE: Multiplanar multisequence MR imaging of the abdomen was performed. Heavily T2-weighted images of the biliary and pancreatic ducts were obtained, and three-dimensional MRCP images were rendered by post processing. COMPARISON:  Yesterday CT FINDINGS: Mild to moderate motion degradation throughout. Lower chest: Mild cardiomegaly.  Small hiatal hernia. Hepatobiliary: No focal liver lesion. Mild decreased signal on inphase imaging, suggesting iron deposition in the liver. Moderate intrahepatic biliary duct dilatation. The left hepatic duct measures 10 mm on image 81/series 4. Moderate common duct dilatation, with the common duct measuring maximally 1.6 cm in the porta hepatis on image 86/ series 4. Tapers gradually distally. No choledocholithiasis. No well-defined obstructive mass. Pancreas: Pancreatic atrophy. Duct size upper normal, including at 4 mm on image 71/ series 4. No acute pancreatitis. Spleen:  Normal in size, without focal abnormality. Adrenals/Urinary Tract: Normal adrenal glands. Mild renal cortical thinning bilaterally. Upper pole 1.6 cm left renal cyst. An interpolar 6 mm T1 hyperintense left renal lesion on image 66/series 12 is likely a complex cyst, but is incompletely characterized. Stomach/Bowel: Normal remainder of the stomach and abdominal bowel loops. Vascular/Lymphatic: Aortic and branch vessel atherosclerosis. Upper normal size porta hepatis nodes are likely reactive. Multiple small retroperitoneal nodes, none of which are pathologic  by size criteria. Other:  No ascites. Musculoskeletal: No acute osseous abnormality. IMPRESSION: 1. Mild to moderately motion degraded exam. 2. Biliary duct dilatation after cholecystectomy. No choledocholithiasis or well-defined obstructive mass. Given the elevated bilirubin, consider further evaluation with ERCP to exclude ampullary stricture or otherwise occult ampullary lesion. 3. Hiatal hernia. 4. 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 Electronically Signed   By: Abigail Miyamoto M.D.   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 ANESTHESIA/SEDATION: Moderate (conscious) sedation was employed during this procedure. A total of Versed 2.0 mg and Fentanyl 100 mcg was administered intravenously. Moderate Sedation Time: 50 minutes. The patient's level of consciousness and vital signs were monitored continuously by radiology nursing throughout the procedure under my direct supervision. FLUOROSCOPY TIME:  Fluoroscopy Time: 5 minutes 12 seconds (218 mGy). COMPLICATIONS: None PROCEDURE: The procedure, risks, benefits, and alternatives were explained to the patient and the patient's family. A complete informed consent was performed, with risk benefit analysis. Specific risks that were discussed for the procedure include bleeding, infection, biliary sepsis, IC use day, organ injury, need for further procedure, need for further surgery, long-term drain placement, cardiopulmonary collapse, death. Questions regarding the procedure were encouraged and answered. The patient understands and consents to the procedure. Patient is position in supine position on the fluoroscopy table, and  the upper abdomen was prepped and draped in the usual sterile fashion. Maximum barrier sterile technique with sterile gowns and gloves were used for the procedure. A timeout was performed prior to the  initiation of the procedure. Local anesthesia was provided with 1% lidocaine with epinephrine. Ultrasound survey of the left liver lobe was performed, with then ultrasound of the right liver lobe. 1% lidocaine was used for local anesthesia, with generous infiltration of the skin and subcutaneous tissues in intercostal location. A Chiba needle was advanced under ultrasound guidance into the right liver lobe. Traditional right-sided PTC unsuccessful with 3 attempts of identifying biliary system. Ultrasound guided access was then used for access into the left-sided biliary system, subcostal approach. Once the tip of this needle was confirmed within the biliary system, an 018 wire was advanced centrally. The needle was removed, a small incision was made with an 11 blade scalpel, and then a triaxial Accustick system was advanced into the biliary system. The metal stiffener and dilator were removed, we confirmed placement with contrast infusion. A coaxial Glidewire and 4 French glide cath were then used to navigate across the obstruction at the hilum of the liver. Once the catheter was presumed to be within the duodenum, the wire was removed and contrast confirmed location. A Coons wire was advanced through the system, and the Accustick and Glidewire were removed. Dilation of the subcutaneous tissue tracks was performed with an 8 [place] and then 10 [place] dilator, and then a 10 [place] biliary drain was placed. Upon positioning of the catheter, the pigtail withdrew into the distal common bile duct, which demonstrated tortuosity. The patient tolerated the procedure well and remained hemodynamically stable throughout. No complications were encountered and no significant blood loss was encountered. FINDINGS: Dilated intrahepatic  ductal system. Tortuous extrahepatic biliary ductal system with obstruction the distal common bile duct. Upon placement of a standard 10 [place] biliary tube, the pigtail catheter withdrew into the distal common bile duct. In order to adequately position sideholes within the dilated intrahepatic ductal system for decompression, we elected to leave the pigtail catheter as an external drain only. The patient will likely need a modified drain for additional length and sideholes after recovery from her episode of acute cholangitis. IMPRESSION: Status post percutaneous transhepatic cholangiogram with placement of a left approach externalized biliary drain. Signed, Dulcy Fanny. Earleen Newport, DO [facility] Radiology PLAN: Given the tortuosity in length of the intrahepatic and extrahepatic biliary ductal system, the patient will likely need a modified drain placed via the left biliary approach, in order to position an adequate number of sideholes within the intrahepatic ductal system, and position a drain across the lesion into the duodenum. Plan for repeat through the tube cholangiogram and drain exchange after her initial recovery in a few days to a week. Electronically Signed   By: Corrie Mckusick D.O.   On: 03/25/2017 09:58     Discharge Exam: Vitals:   03/30/17 0551 03/30/17 1514  BP: 128/74 (!) 144/79  Pulse: 87 75  Resp: 18 18  Temp: 99.1 F (37.3 C) 98.4 F (36.9 C)  SpO2: 100% 97%   Vitals:   03/29/17 2339 03/29/17 2347 03/30/17 0551 03/30/17 1514  BP: 112/78  128/74 (!) 144/79  Pulse: (!) 144 75 87 75  Resp:   18 18  Temp:   99.1 F (37.3 C) 98.4 F (36.9 C)  TempSrc:   Oral Oral  SpO2:   100% 97%  Weight:      Height:        General: Pt is alert, follows commands appropriately, not in acute distress Cardiovascular: Regular rate and rhythm, S1/S2 +, no murmurs, no rubs, no gallops Respiratory:  Clear to auscultation bilaterally, no wheezing, no  crackles, no rhonchi Abdominal: Soft, non tender, non distended, bowel sounds +, no guarding   Discharge Instructions  Discharge Instructions    Diet - low sodium heart healthy    Complete by:  As directed    Increase activity slowly    Complete by:  As directed      Allergies as of 03/30/2017      Reactions   Codeine Nausea And Vomiting   Made "very sick"      Medication List    STOP taking these medications   atorvastatin 10 MG tablet Commonly known as:  LIPITOR   cloNIDine 0.2 MG tablet Commonly known as:  CATAPRES   lisinopril 10 MG tablet Commonly known as:  PRINIVIL,ZESTRIL     TAKE these medications   albuterol 108 (90 Base) MCG/ACT inhaler Commonly known as:  PROVENTIL HFA;VENTOLIN HFA Inhale 2 puffs into the lungs every 6 (six) hours as needed for wheezing or shortness of breath.   allopurinol 300 MG tablet Commonly known as:  ZYLOPRIM Take 300 mg by mouth daily.   ALPRAZolam 0.25 MG tablet Commonly known as:  XANAX Take 0.25 mg by mouth at bedtime.   amiodarone 400 MG tablet Commonly known as:  PACERONE - continue oral amiodarone 400 mg twice a day through 04/05/2017 - At that time plan to reduce dose to 400 mg by mouth daily for 1 week, continue 200 mg daily until outpatient follow-up   apixaban 5 MG Tabs tablet Commonly known as:  ELIQUIS Take 1 tablet (5 mg total) by mouth 2 (two) times daily. Stop taking this medication on October 9th, 2018. What changed:  medication strength  how much to take  additional instructions   chlorthalidone 25 MG tablet Commonly known as:  HYGROTON Take 25 mg by mouth daily.   cholecalciferol 1000 units tablet Commonly known as:  VITAMIN D Take 1,000 Units by mouth daily.   diltiazem 300 MG 24 hr capsule Commonly known as:  CARDIZEM CD Take 300 mg by mouth daily.   doxycycline 100 MG tablet Commonly known as:  VIBRA-TABS Take 1 tablet (100 mg total) by mouth 2 (two) times daily.   levalbuterol 0.63  MG/3ML nebulizer solution Commonly known as:  XOPENEX Take 3 mLs (0.63 mg total) by nebulization every 6 (six) hours as needed for wheezing or shortness of breath.   ondansetron 4 MG tablet Commonly known as:  ZOFRAN Take 1 tablet (4 mg total) by mouth every 6 (six) hours as needed for nausea.   oxyCODONE 5 MG immediate release tablet Commonly known as:  Oxy IR/ROXICODONE Take 1 tablet (5 mg total) by mouth every 4 (four) hours as needed for moderate pain.   pantoprazole 40 MG tablet Commonly known as:  PROTONIX Take 1 tablet (40 mg total) by mouth daily.   polyethylene glycol packet Commonly known as:  MIRALAX / GLYCOLAX Take 17 g by mouth daily.   senna-docusate 8.6-50 MG tablet Commonly known as:  Senokot-S Take 1 tablet by mouth at bedtime as needed for mild constipation.   vitamin B-12 1000 MCG tablet Commonly known as:  CYANOCOBALAMIN Take 1,000 mcg by mouth daily.       Follow-up Information    Barrett, Evelene Croon, PA-C Follow up on 04/20/2017.   Specialties:  Cardiology, Radiology Why:  Cardiology Follow-Up on 04/20/2017 at 3:00PM (with Dr. Victorino December Physicain Assistant) Contact information: [address] [place] [address] [phone]  Stark Klein, MD Follow up.   Specialty:  General Surgery Why:  Call after your GI procedure and set up an appointment with Dr. Barry Dienes.   Contact information: [address] [place] [address] [phone]        [name], [name], MD Follow up.   Specialty:  Internal Medicine Contact information: [address] [place] [address] [phone]            The results of significant diagnostics from this hospitalization (including imaging, microbiology, ancillary and laboratory) are listed below for reference.     Microbiology: Recent Results (from the past 240 hour(s))  Culture, blood (routine x 2)     Status: Abnormal   Collection Time: 03/23/17  1:20 AM  Result  Value Ref Range Status   Specimen Description BLOOD RIGHT FOREARM  Final   Special Requests   Final    BOTTLES DRAWN AEROBIC AND ANAEROBIC Blood Culture adequate volume   Culture  Setup Time   Final    GRAM NEGATIVE RODS ANAEROBIC BOTTLE ONLY CRITICAL RESULT CALLED TO, READ BACK BY AND VERIFIED WITH: J.GRIMSLEY PHARMD 03/23/17 2325 L.CHAMPION Performed at [facility], [place] [address], [place], [place] [address]    Culture KLEBSIELLA PNEUMONIAE (A)  Final   Report Status 03/25/2017 FINAL  Final   Organism ID, Bacteria KLEBSIELLA PNEUMONIAE  Final      Susceptibility   Klebsiella pneumoniae - MIC*    AMPICILLIN >=32 RESISTANT Resistant     CEFAZOLIN <=4 SENSITIVE Sensitive     CEFEPIME <=1 SENSITIVE Sensitive     CEFTAZIDIME <=1 SENSITIVE Sensitive     CEFTRIAXONE <=1 SENSITIVE Sensitive     CIPROFLOXACIN <=0.25 SENSITIVE Sensitive     GENTAMICIN <=1 SENSITIVE Sensitive     IMIPENEM <=0.25 SENSITIVE Sensitive     TRIMETH/SULFA <=20 SENSITIVE Sensitive     AMPICILLIN/SULBACTAM 4 SENSITIVE Sensitive     PIP/TAZO <=4 SENSITIVE Sensitive     Extended ESBL NEGATIVE Sensitive     * KLEBSIELLA PNEUMONIAE  Blood Culture ID Panel (Reflexed)     Status: Abnormal   Collection Time: 03/23/17  1:20 AM  Result Value Ref Range Status   Enterococcus species NOT DETECTED NOT DETECTED Final   Listeria monocytogenes NOT DETECTED NOT DETECTED Final   Staphylococcus species NOT DETECTED NOT DETECTED Final   Staphylococcus aureus NOT DETECTED NOT DETECTED Final   Streptococcus species NOT DETECTED NOT DETECTED Final   Streptococcus agalactiae NOT DETECTED NOT DETECTED Final   Streptococcus pneumoniae NOT DETECTED NOT DETECTED Final   Streptococcus pyogenes NOT DETECTED NOT DETECTED Final   Acinetobacter baumannii NOT DETECTED NOT DETECTED Final   Enterobacteriaceae species DETECTED (A) NOT DETECTED Final    Comment: Enterobacteriaceae represent a large family of gram-negative bacteria, not a  single organism. CRITICAL RESULT CALLED TO, READ BACK BY AND VERIFIED WITH: J.GRIMSLEY PHARMD 03/23/17 2325 L.CHAMPION    Enterobacter cloacae complex NOT DETECTED NOT DETECTED Final   Escherichia coli NOT DETECTED NOT DETECTED Final   Klebsiella oxytoca NOT DETECTED NOT DETECTED Final   Klebsiella pneumoniae DETECTED (A) NOT DETECTED Final    Comment: CRITICAL RESULT CALLED TO, READ BACK BY AND VERIFIED WITH: J.GRIMSLEY PHARMD 03/23/17 2325 L.CHAMPION    Proteus species NOT DETECTED NOT DETECTED Final   Serratia marcescens NOT DETECTED NOT DETECTED Final   Carbapenem resistance NOT DETECTED NOT DETECTED Final   Haemophilus influenzae NOT DETECTED NOT DETECTED Final   Neisseria meningitidis NOT DETECTED NOT  DETECTED Final   Pseudomonas aeruginosa NOT DETECTED NOT DETECTED Final   Candida albicans NOT DETECTED NOT DETECTED Final   Candida glabrata NOT DETECTED NOT DETECTED Final   Candida krusei NOT DETECTED NOT DETECTED Final   Candida parapsilosis NOT DETECTED NOT DETECTED Final   Candida tropicalis NOT DETECTED NOT DETECTED Final    Comment: Performed at [facility], [place] [address] [place], [place] [address]  Culture, blood (routine x 2)     Status: None   Collection Time: 03/23/17  2:36 AM  Result Value Ref Range Status   Specimen Description BLOOD RIGHT HAND  Final   Special Requests   Final    BOTTLES DRAWN AEROBIC ONLY Blood Culture adequate volume   Culture   Final    NO GROWTH 5 DAYS Performed at [facility], [place] [address] [place], [place] [address]    Report Status 03/28/2017 FINAL  Final  MRSA PCR Screening     Status: None   Collection Time: 03/26/17  1:12 AM  Result Value Ref Range Status   MRSA by PCR NEGATIVE NEGATIVE Final    Comment:        The GeneXpert MRSA Assay (FDA approved for NASAL specimens only), is one component of a comprehensive MRSA colonization surveillance program. It is not intended to diagnose MRSA infection nor to guide  or monitor treatment for MRSA infections.   Culture, blood (routine x 2)     Status: None (Preliminary result)   Collection Time: 03/26/17  8:05 AM  Result Value Ref Range Status   Specimen Description BLOOD RIGHT ARM  Final   Special Requests IN PEDIATRIC BOTTLE Blood Culture adequate volume  Final   Culture   Final    NO GROWTH 4 DAYS Performed at [facility], [place] [address], [place], [place] [address]    Report Status PENDING  Incomplete  Culture, blood (routine x 2)     Status: None (Preliminary result)   Collection Time: 03/26/17  8:10 AM  Result Value Ref Range Status   Specimen Description BLOOD RIGHT ARM  Final   Special Requests IN PEDIATRIC BOTTLE Blood Culture adequate volume  Final   Culture   Final    NO GROWTH 4 DAYS Performed at [facility], [place] [address] [place], [place] [address]    Report Status PENDING  Incomplete  Body fluid culture     Status: None   Collection Time: 03/27/17  3:36 PM  Result Value Ref Range Status   Specimen Description BILE  Final   Special Requests NONE  Final   Gram Stain NO WBC SEEN NO ORGANISMS SEEN   Final   Culture   Final    NO GROWTH 3 DAYS Performed at [facility], [place] [address], [place], [place] [address]    Report Status 03/30/2017 FINAL  Final  Culture, blood (single)     Status: None (Preliminary result)   Collection Time: 03/28/17  4:28 PM  Result Value Ref Range Status   Specimen Description BLOOD RIGHT ARM  Final   Special Requests IN PEDIATRIC BOTTLE Blood Culture adequate volume  Final   Culture   Final    NO GROWTH 2 DAYS Performed at [facility], [place] [address], [place], [place] [address]    Report Status PENDING  Incomplete  Culture, blood (routine x 2) Call MD if unable to obtain prior to antibiotics being given  Status: None (Preliminary result)   Collection Time: 03/29/17 10:43 AM  Result Value Ref Range Status   Specimen Description BLOOD LEFT HAND  Final   Special  Requests   Final    BOTTLES DRAWN AEROBIC AND ANAEROBIC Blood Culture adequate volume   Culture   Final    NO GROWTH 1 DAY Performed at [facility], [address], [place], [place] [address]    Report Status PENDING  Incomplete  Culture, blood (routine x 2) Call MD if unable to obtain prior to antibiotics being given     Status: None (Preliminary result)   Collection Time: 03/29/17 10:53 AM  Result Value Ref Range Status   Specimen Description BLOOD LEFT HAND  Final   Special Requests   Final    BOTTLES DRAWN AEROBIC AND ANAEROBIC Blood Culture adequate volume   Culture   Final    NO GROWTH 1 DAY Performed at [facility], [place] [address], [place], [place] [address]    Report Status PENDING  Incomplete  Culture, sputum-assessment     Status: None   Collection Time: 03/29/17 11:03 AM  Result Value Ref Range Status   Specimen Description SPU EXPECTORATED  Final   Special Requests NONE  Final   Sputum evaluation   Final    Sputum specimen not acceptable for testing.  Please recollect.   NOTIFIED A.POWERS 03/29/2017 1300 BY J.ROCK    Report Status 03/29/2017 FINAL  Final     Labs: Basic Metabolic Panel:  Recent Labs Lab 03/24/17 0544  03/25/17 1430 03/26/17 0149 03/26/17 0810 03/27/17 0320 03/28/17 0316 03/29/17 0459 03/30/17 0201  NA 139  < >  --  138 136 138 137 138 138  K 2.9*  < >  --  2.6* 3.5 3.2* 3.2* 3.9 3.5  CL 103  < >  --  95* 98* 98* 99* 100* 103  CO2 25  < >  --  32 '29 29 28 27 28  ' GLUCOSE 114*  < >  --  101* 106* 90 87 92 98  BUN 18  < >  --  '16 15 18 18 16 14  ' CREATININE 1.43*  < >  --  1.43* 1.29* 1.39* 1.22* 1.17* 1.16*  CALCIUM 8.4*  < >  --  8.5* 8.5* 8.4* 8.2* 8.2* 8.0*  MG 1.5*  --  2.5* 1.8  --  2.2  --  1.8  --   < > = values in this interval not displayed. Liver Function Tests:  Recent Labs Lab 03/24/17 0544 03/25/17 0623 03/26/17 0149 03/28/17 0316 03/29/17 0459  AST 139* 65* 33 16 22  ALT 148* 99* 74* 32 27  ALKPHOS  267* 200* 188* 143* 133*  BILITOT 3.4* 1.8* 1.2 0.9 1.0  PROT 6.4* 5.6* 6.4* 6.0* 6.1*  ALBUMIN 3.0* 2.5* 2.8* 2.6* 2.8*   CBC:  Recent Labs Lab 03/26/17 0149 03/27/17 0320 03/28/17 0316 03/29/17 0459 03/30/17 0201  WBC 14.0* 11.3* 9.6 7.9 6.6  HGB 10.6* 10.4* 9.8* 10.0* 9.8*  HCT 34.8* 33.9* 31.6* 33.4* 31.2*  MCV 83.5 82.9 82.3 83.9 83.6  PLT 186 212 220 198 188   CBG:  Recent Labs Lab 03/29/17 0753 03/29/17 1155 03/29/17 1658 03/30/17 0625 03/30/17 1239  GLUCAP 97 96 100* 129* 145*     SIGNED: Time coordinating discharge: 60 minutes  Faye Ramsay, MD  [facility] 03/30/2017, 4:29 PM Pager [phone]  If 7PM-7AM, please contact night-coverage [email or web address] Password [email or web address]

## 2017-03-30 NOTE — Progress Notes (Signed)
*  PRELIMINARY RESULTS* Vascular Ultrasound Left upper extremity venous duplex has been completed.  Preliminary findings: The visualized veins of the left upper extremity appear negative for deep vein thrombosis.  Superficial vein thrombosis is noted in the left cephalic vein.  Preliminary results given to patients nurse @ 15:10  Everrett Coombe 03/30/2017, 3:12 PM

## 2017-03-30 NOTE — Care Management Note (Signed)
Case Management Note  Patient Details  Name: Peggy Armstrong MRN: [id number] Date of Birth: 1945/09/11  Subjective/Objective:    Transfer from SDU. SIRS. From home.                Action/Plan:d/c plan home.   Expected Discharge Date:                  Expected Discharge Plan:  Home/Self Care  In-House Referral:     Discharge planning Services  CM Consult  Post Acute Care Choice:    Choice offered to:     DME Arranged:    DME Agency:     HH Arranged:    HH Agency:     Status of Service:  In process, will continue to follow  If discussed at Long Length of Stay Meetings, dates discussed:    Additional Comments:  Dessa Phi, RN 03/30/2017, 12:53 PM

## 2017-03-30 NOTE — Telephone Encounter (Signed)
Pt still admitted as of 10:17 am this morning.  Will check status on Friday.

## 2017-03-30 NOTE — Discharge Instructions (Signed)
Stop taking Eliquis on October 9th, 2018 Stop taking cholesterol medication until your primary doctor approves   Cholangitis Cholangitis is inflammation of the group of tubes (ducts) that carry digestive juices from the liver, gallbladder, and pancreas to the small intestine. This group of ducts is called the biliary tract. Cholangitis can cause fever, abdominal pain, and yellowish discoloration of the skin, the whites of the eyes, and mucous membranes (jaundice). Cholangitis can get worse very quickly and cause infection throughout the body (sepsis). It is important to diagnose and treat cholangitis as soon as possible. What are the causes? This condition is usually caused by a blockage (obstruction) in the biliary tract. The most common causes of obstruction are:  Formation of hard particles (stones) in the biliary tract.  Damage to the biliary tract from a previous surgical or diagnostic procedure.  Other causes of an obstruction include:  Cysts or tumors in the biliary tract.  A type of liver disease that affects the biliary tract (primary sclerosing cholangitis).  Being born with a narrow biliary tract.  When the flow of digestive juices is blocked, bacteria that normally live in the intestine can grow and spread inside the biliary tract. What increases the risk? The following factors may make you more likely to develop this condition:  Being 42?71 years old.  Having a history of stones in the biliary tract.  Having had cholangitis in the past.  Having HIV.  Having another condition that affects the biliary tract.  Having had a procedure to diagnose or treat problems with the biliary tract, especially endoscopic retrograde cholangiopancreatography (ERCP). These types of procedures may cause scarring and obstruction that can lead to infection.  What are the signs or symptoms? The most common symptoms of this condition are fever, abdominal pain, and jaundice. Most of the  time, all of these symptoms are present. Other symptoms may include:  Chills.  Tiredness.  Nausea.  Dark-colored urine.  Clay-colored stools.  Confusion.  Itchy skin.  How is this diagnosed? This condition may be diagnosed based on:  Your symptoms.  A physical exam.  Your medical history. Your health care provider may ask whether you have had stones, ERCP, or other procedures involving the biliary tract in the past.  Blood tests.  Imaging studies, such as: ? An ultrasound. This uses sound waves to make an image of any obstructions that you have. ? An MRI. ? A CT scan.  ERCP to check the biliary tract for possible causes of cholangitis. During ERCP, a thin, lighted tube (endoscope) is passed through your mouth and down your throat into the first part of your small intestine (duodenum). A small, plastic tube (cannula) is then passed through the endoscope and directed into your bile duct or pancreatic duct. Dye is then injected through the cannula and X-rays are taken.  How is this treated?  This condition is usually treated at a hospital. Treatment may include:  Receiving fluids, nutrition, and antibiotic medicines through an IV tube. You may be given antibiotics that kill most of the bacteria known to cause cholangitis (broad spectrumantibiotics).  ERCP or another surgical procedure to open and drain the biliary tract.  Follow these instructions at home: Medicines  Take over-the-counter and prescription medicines only as told by your health care provider.  Take your antibiotic medicine as told by your health care provider. Do not stop taking the antibiotic even if you start to feel better. General instructions  Return to your normal activities as told by  your health care provider. Ask your health care provider what activities are safe for you.  Follow instructions from your health care provider about eating or drinking restrictions.  Maintain a healthy  weight.  Exercise regularly, as told by your health care provider.  Keep all follow-up visits as told by your health care provider. This is important. Contact a health care provider if:  You have a fever.  Your symptoms return or become more severe.  You suddenly lose weight. This information is not intended to replace advice given to you by your health care provider. Make sure you discuss any questions you have with your health care provider. Document Released: 07/10/2015 Document Revised: 11/19/2015 Document Reviewed: 07/02/2014 Elsevier Interactive Patient Education  [employer].

## 2017-03-30 NOTE — Care Management Note (Signed)
Case Management Note  Patient Details  Name: Peggy Armstrong MRN: [id number] Date of Birth: 03/12/1946  Subjective/Objective: Noted HHC orders placed. TC patient about [place] choice-patient chose [facility] HHR[facility]/PT/aide ordered-rep Santiago Glad aware of d/c & able to accept. No further CM needs.                   Action/Plan:d/c home w/HHC   Expected Discharge Date:  03/30/17               Expected Discharge Plan:  [place]  In-House Referral:     Discharge planning Services  CM Consult  Post Acute Care Choice:   (cane) Choice offered to:  Patient  DME Arranged:    DME Agency:     HH Arranged:  RN, PT, Nurse's Aide [place] Agency:  [facility]  Status of Service:  Completed, signed off  If discussed at [place] of Stay Meetings, dates discussed:    Additional Comments:  Dessa Phi, RN 03/30/2017, 4:53 PM

## 2017-03-30 NOTE — Progress Notes (Signed)
Referring Physician(s): Danis,H  Supervising Physician: Markus Daft  Patient Status:  [facility] - In-pt  Chief Complaint: Biliary obstruction, abdominal pain   Subjective: Patient doing okay today; still some dyspnea with exertion, less cough; no fever or chills; no significant abdominal pain, nausea or vomiting; left forearm with edema   Allergies: Codeine  Medications: Prior to Admission medications   Medication Sig Start Date End Date Taking? Authorizing Provider  albuterol (PROVENTIL HFA;VENTOLIN HFA) 108 (90 Base) MCG/ACT inhaler Inhale 2 puffs into the lungs every 6 (six) hours as needed for wheezing or shortness of breath.  10/28/16 10/28/17 Yes [provider]  allopurinol (ZYLOPRIM) 300 MG tablet Take 300 mg by mouth daily.  05/06/15  Yes [provider]  ALPRAZolam (XANAX) 0.25 MG tablet Take 0.25 mg by mouth at bedtime.  07/21/15  Yes [provider]  apixaban (ELIQUIS) 2.5 MG TABS tablet Take 2.5 mg by mouth 2 (two) times daily.   Yes [provider]  atorvastatin (LIPITOR) 10 MG tablet Take 10 mg by mouth daily. 07/12/16  Yes [provider]  chlorthalidone (HYGROTON) 25 MG tablet Take 25 mg by mouth daily. 07/22/15  Yes [provider]  cholecalciferol (VITAMIN D) 1000 units tablet Take 1,000 Units by mouth daily.   Yes [provider]  cloNIDine (CATAPRES) 0.2 MG tablet Take 0.2 mg by mouth 2 (two) times daily. 07/22/15  Yes [provider]  diltiazem (CARDIZEM CD) 300 MG 24 hr capsule Take 300 mg by mouth daily. 07/22/15  Yes [provider]  lisinopril (PRINIVIL,ZESTRIL) 10 MG tablet Take 10 mg by mouth daily. 07/22/15  Yes [provider]  vitamin B-12 (CYANOCOBALAMIN) 1000 MCG tablet Take 1,000 mcg by mouth daily.   Yes [provider]     Vital Signs: BP 128/74 (BP Location: Right Arm)   Pulse 87   Temp 99.1 F (37.3 C) (Oral)   Resp 18   Ht 5\' 1"  (1.549 m)   Wt 241 lb  6.5 oz (109.5 kg)   SpO2 100%   BMI 45.61 kg/m   Physical [place], alert. Biliary drain intact, dressing dry, insertion site not significantly tender, output 200 mL, bile cultures negative  Imaging: Dg Chest 2 View  Result Date: 03/28/2017 CLINICAL DATA:  Dyspnea and weakness EXAM: CHEST  2 VIEW COMPARISON:  None in PACs FINDINGS: The lungs are mildly hypoinflated. The lung markings are coarse at both bases greatest on the left. There are small bilateral pleural effusions. Cardiac silhouette is enlarged. The pulmonary vascularity is not clearly engorged. There is calcification in the wall of the aortic arch. There is multilevel degenerative disc disease of the thoracic spine. IMPRESSION: Mild cardiomegaly without significant pulmonary vascular congestion. Bibasilar atelectasis or pneumonia with small bilateral pleural effusions. Thoracic aortic atherosclerosis. Electronically Signed   By: David  Martinique M.D.   On: 03/28/2017 13:19   Ct Chest Wo Contrast  Result Date: 03/29/2017 CLINICAL DATA:  71 year old female with fever of unknown origin. Shortness of breath. Intermittent cough. Cholangitis status post percutaneous transhepatic external drainage on 03/24/2017. EXAM: CT CHEST WITHOUT CONTRAST TECHNIQUE: Multidetector CT imaging of the chest was performed following the standard protocol without IV contrast. COMPARISON:  Chest radiographs 03/28/2017. Abdomen MRI 03/23/2017. CT Abdomen and Pelvis 03/22/2017. FINDINGS: Cardiovascular: Stable cardiomegaly. Calcified coronary artery and aortic atherosclerosis is evident. Vascular patency is not evaluated in the absence of IV contrast. Mild enlargement of the central pulmonary arteries. No pathologic pericardial effusion. Mediastinum/Nodes: 3 cm rounded  area of Low-density (16 Hounsfield units) in the mediastinum at the anterior carina space might be an unusual accumulation of fluid in the superior pericardial recess. Other mediastinal lymph nodes appear  within normal limits. No definite hilar lymphadenopathy. Mostly fat containing hiatal hernia is stable from the recent abdomen CT. Lungs/Pleura: New small to moderate size layering bilateral pleural effusions since 03/23/2017. Major airways are patent. There is linear atelectasis in the left upper lobe, and compressive atelectasis in both lower lobes with minimal air bronchograms. Lung parenchyma elsewhere is within normal limits. Upper Abdomen: Left lobe approach percutaneous biliary drain is partially visible. No upper abdominal free fluid or free air. The visible intrahepatic bile ducts now appear decompressed. Otherwise stable visualized upper abdominal viscera. Musculoskeletal: No acute osseous abnormality identified. IMPRESSION: 1. Small to moderate layering bilateral pleural effusions are new since 03/23/2017. Superimposed pulmonary atelectasis with no definite pneumonia. 2. Rounded but fluid density 3 cm area in the mediastinum anterior to the carina is favored to be pericardial fluid in a superior pericardial recess (normal variant). A follow-up Chest CT with IV contrast (such as in 3 months time) is recommended to document stability. 3. Partially visible upper abdomen transhepatic biliary drain with no adverse features. 4. Cardiomegaly. Electronically Signed   By: Genevie Ann M.D.   On: 03/29/2017 14:48    Labs:  CBC:  Recent Labs  03/27/17 0320 03/28/17 0316 03/29/17 0459 03/30/17 0201  WBC 11.3* 9.6 7.9 6.6  HGB 10.4* 9.8* 10.0* 9.8*  HCT 33.9* 31.6* 33.4* 31.2*  PLT 212 220 198 188    COAGS:  Recent Labs  03/23/17 0121 03/24/17 0717  INR 1.52 1.30  APTT 39*  --     BMP:  Recent Labs  03/27/17 0320 03/28/17 0316 03/29/17 0459 03/30/17 0201  NA 138 137 138 138  K 3.2* 3.2* 3.9 3.5  CL 98* 99* 100* 103  CO2 29 28 27 28   GLUCOSE 90 87 92 98  BUN 18 18 16 14   CALCIUM 8.4* 8.2* 8.2* 8.0*  CREATININE 1.39* 1.22* 1.17* 1.16*  GFRNONAA 37* 43* 46* 46*  GFRAA 43* 50* 53*  54*    LIVER FUNCTION TESTS:  Recent Labs  03/25/17 0623 03/26/17 0149 03/28/17 0316 03/29/17 0459  BILITOT 1.8* 1.2 0.9 1.0  AST 65* 33 16 22  ALT 99* 74* 32 27  ALKPHOS 200* 188* 143* 133*  PROT 5.6* 6.4* 6.0* 6.1*  ALBUMIN 2.5* 2.8* 2.6* 2.8*    Assessment and Plan: Pt with hx abd pain, 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 Tentative plan at this time is to have patient undergo exchange of external biliary drain for internal/external biliary drain early next week as outpatient. Eliquis will need to be held for 48 hrs before procedure. Biliary drain should be flushed (not aspirated) once daily as outpatient, output recorded and dressing changed as needed. Pt can contact radiology service at [phone] (speak with IR MD on call) with any questions in interim regarding drain management following discharge.   Electronically Signed: D. Rowe Robert, PA-C 03/30/2017, 1:43 PM   I spent a total of 15 minutes at the the patient's bedside AND on the patient's hospital floor or unit, greater than 50% of which was counseling/coordinating care for biliary  drain    Patient ID: Peggy Armstrong, female   DOB: [birth year], 71 y.o.   MRN: [id number]

## 2017-03-30 NOTE — Progress Notes (Addendum)
ANTICOAGULATION CONSULT NOTE - Initial Consult  Pharmacy Consult for apixaban (resuming home medication) Indication: DVT, atrial fibrillation (new diagnosis of afib this admission)  Allergies  Allergen Reactions  . Codeine Nausea And Vomiting    Made "very sick"    Patient Measurements: Height: 5\' 1"  (154.9 cm) Weight: 241 lb 6.5 oz (109.5 kg) IBW/kg (Calculated) : 47.8 Heparin Dosing Weight:   Vital Signs: Temp: 99.1 F (37.3 C) (10/04 0551) Temp Source: Oral (10/04 0551) BP: 128/74 (10/04 0551) Pulse Rate: 87 (10/04 0551)  Labs:  Recent Labs  03/28/17 0316 03/29/17 0459 03/30/17 0201  HGB 9.8* 10.0* 9.8*  HCT 31.6* 33.4* 31.2*  PLT 220 198 188  CREATININE 1.22* 1.17* 1.16*    Estimated Creatinine Clearance: 50.9 mL/min (A) (by C-G formula based on SCr of 1.16 mg/dL (H)).   Medical History: Past Medical History:  Diagnosis Date  . Clotting disorder ([place])    right DVT  . Gout   . Gout   . Hypertension   . RA (rheumatoid arthritis) (H[facility])    Assessment: 89 YOF with history of DVT and new diagnosis of atrial fibrillation during this admission.  Prior to admission she was on apixaban 2.5mg  PO BID.  Her dose was decreased to 2.5mg  BID by Dr. Alvy Bimler due to bleeding and borderline CrCl.  Pharmacy asked to resume her home apixaban dose as plans for procedure have been postponed  Today, 03/30/2017  CBC: Hgb stable, pltc WNL  SCr elevated but improved (SCr = 1.16)  Currently she does not meet any criteria for dose reduction  Goal of Therapy:  - dose for indication and patient specific parameters   Plan:   Since atrial fibrillation is new diagnosis during this admission, resume apixaban at FDA-approved dose of 5mg  PO BID  Discussed with Cardiology and hospitalist  Monitor for bleeding  Stop apixaban at least 36h prior to rescheduled EUS 10/11  Pharmacy to sign-off from note writing unless change is indicated  Doreene Eland, PharmD, BCPS.   Pager:  [phone] 03/30/2017 1:10 PM

## 2017-03-31 ENCOUNTER — Other Ambulatory Visit: Payer: Self-pay | Admitting: Radiology

## 2017-03-31 LAB — CULTURE, BLOOD (ROUTINE X 2)
CULTURE: NO GROWTH
Culture: NO GROWTH
Special Requests: ADEQUATE
Special Requests: ADEQUATE

## 2017-03-31 NOTE — Telephone Encounter (Signed)
I spoke with the pt and she verbalized understanding of the EUS.  She will call with any concerns

## 2017-04-01 MED ORDER — DOXYCYCLINE HYCLATE 100 MG PO TABS: 100.0000 mg | ORAL_TABLET | Freq: Two times a day (BID) | ORAL | 0 refills | Status: DC

## 2017-04-02 LAB — CULTURE, BLOOD (SINGLE)
CULTURE: NO GROWTH
SPECIAL REQUESTS: ADEQUATE

## 2017-04-03 ENCOUNTER — Ambulatory Visit (HOSPITAL_COMMUNITY)
Admission: RE | Admit: 2017-04-03 | Discharge: 2017-04-03 | Disposition: A | Discharge status: Home / Self Care | Payer: Medicare Other | Source: Ambulatory Visit | Attending: Radiology | Admitting: Radiology

## 2017-04-03 ENCOUNTER — Other Ambulatory Visit: Payer: Self-pay | Admitting: Radiology

## 2017-04-03 ENCOUNTER — Ambulatory Visit (HOSPITAL_COMMUNITY): Admission: RE | Admit: 2017-04-03 | Payer: Medicare Other | Source: Ambulatory Visit

## 2017-04-03 ENCOUNTER — Encounter (HOSPITAL_COMMUNITY): Payer: Self-pay

## 2017-04-03 DIAGNOSIS — Z7901 Long term (current) use of anticoagulants: Secondary | ICD-10-CM | POA: Diagnosis not present

## 2017-04-03 DIAGNOSIS — K831 Obstruction of bile duct: Secondary | ICD-10-CM | POA: Diagnosis not present

## 2017-04-03 DIAGNOSIS — M069 Rheumatoid arthritis, unspecified: Secondary | ICD-10-CM | POA: Diagnosis not present

## 2017-04-03 DIAGNOSIS — Z4682 Encounter for fitting and adjustment of non-vascular catheter: Secondary | ICD-10-CM | POA: Insufficient documentation

## 2017-04-03 DIAGNOSIS — M109 Gout, unspecified: Secondary | ICD-10-CM | POA: Diagnosis not present

## 2017-04-03 DIAGNOSIS — I10 Essential (primary) hypertension: Secondary | ICD-10-CM | POA: Diagnosis not present

## 2017-04-03 HISTORY — PX: IR CONVERT BILIARY DRAIN TO INT EXT BILIARY DRAIN: IMG6045

## 2017-04-03 LAB — COMPREHENSIVE METABOLIC PANEL
ALBUMIN: 2.8 g/dL — AB (ref 3.5–5.0)
ALK PHOS: 145 U/L — AB (ref 38–126)
ALT: 42 U/L (ref 14–54)
AST: 36 U/L (ref 15–41)
Anion gap: 12 (ref 5–15)
BUN: 20 mg/dL (ref 6–20)
CALCIUM: 8.5 mg/dL — AB (ref 8.9–10.3)
CHLORIDE: 98 mmol/L — AB (ref 101–111)
CO2: 25 mmol/L (ref 22–32)
CREATININE: 1.39 mg/dL — AB (ref 0.44–1.00)
GFR calc Af Amer: 43 mL/min — ABNORMAL LOW (ref >60)
GFR calc non Af Amer: 37 mL/min — ABNORMAL LOW (ref >60)
GLUCOSE: 99 mg/dL (ref 65–99)
Potassium: 2.8 mmol/L — ABNORMAL LOW (ref 3.5–5.1)
SODIUM: 135 mmol/L (ref 135–145)
Total Bilirubin: 1.2 mg/dL (ref 0.3–1.2)
Total Protein: 6.3 g/dL — ABNORMAL LOW (ref 6.5–8.1)

## 2017-04-03 LAB — CBC
HCT: 33.4 % — ABNORMAL LOW (ref 36.0–46.0)
Hemoglobin: 10.3 g/dL — ABNORMAL LOW (ref 12.0–15.0)
MCH: 25.3 pg — ABNORMAL LOW (ref 26.0–34.0)
MCHC: 30.8 g/dL (ref 30.0–36.0)
MCV: 82.1 fL (ref 78.0–100.0)
PLATELETS: 304 10*3/uL (ref 150–400)
RBC: 4.07 MIL/uL (ref 3.87–5.11)
RDW: 18.3 % — ABNORMAL HIGH (ref 11.5–15.5)
WBC: 10.3 10*3/uL (ref 4.0–10.5)

## 2017-04-03 LAB — CULTURE, BLOOD (ROUTINE X 2)
CULTURE: NO GROWTH
Culture: NO GROWTH
Special Requests: ADEQUATE
Special Requests: ADEQUATE

## 2017-04-03 LAB — PROTIME-INR
INR: 1.3
Prothrombin Time: 16.1 seconds — ABNORMAL HIGH (ref 11.4–15.2)

## 2017-04-03 LAB — APTT: aPTT: 34 seconds (ref 24–36)

## 2017-04-03 MED ORDER — LIDOCAINE HCL (PF) 1 % IJ SOLN
INTRAMUSCULAR | Status: AC | PRN
  Administered 2017-04-03: 8 mL

## 2017-04-03 MED ORDER — SODIUM CHLORIDE 0.9 % IV SOLN: INTRAVENOUS | Status: DC

## 2017-04-03 MED ORDER — MIDAZOLAM HCL 2 MG/2ML IJ SOLN
INTRAMUSCULAR | Status: AC
  Filled 2017-04-03: qty 2

## 2017-04-03 MED ORDER — IOPAMIDOL (ISOVUE-300) INJECTION 61%
INTRAVENOUS | Status: AC
  Administered 2017-04-03: 20 mL
  Filled 2017-04-03: qty 50

## 2017-04-03 MED ORDER — MIDAZOLAM HCL 2 MG/2ML IJ SOLN
INTRAMUSCULAR | Status: AC | PRN
  Administered 2017-04-03 (×2): 1 mg via INTRAVENOUS

## 2017-04-03 MED ORDER — PIPERACILLIN-TAZOBACTAM 3.375 G IVPB
3.3750 g | INTRAVENOUS | Status: AC
  Administered 2017-04-03: 3.375 g via INTRAVENOUS
  Filled 2017-04-03 (×2): qty 50

## 2017-04-03 MED ORDER — FENTANYL CITRATE (PF) 100 MCG/2ML IJ SOLN
INTRAMUSCULAR | Status: AC
  Filled 2017-04-03: qty 2

## 2017-04-03 MED ORDER — SODIUM CHLORIDE 0.9 % IV SOLN
INTRAVENOUS | Status: AC | PRN
  Administered 2017-04-03: 50 mL/h via INTRAVENOUS

## 2017-04-03 MED ORDER — LIDOCAINE HCL 1 % IJ SOLN
INTRAMUSCULAR | Status: AC
  Filled 2017-04-03: qty 20

## 2017-04-03 MED ORDER — SODIUM CHLORIDE 0.9% FLUSH: 5.0000 mL | Freq: Three times a day (TID) | INTRAVENOUS | Status: DC

## 2017-04-03 MED ORDER — FENTANYL CITRATE (PF) 100 MCG/2ML IJ SOLN
INTRAMUSCULAR | Status: AC | PRN
  Administered 2017-04-03 (×2): 50 ug via INTRAVENOUS

## 2017-04-03 NOTE — Procedures (Signed)
Interventional Radiology Procedure Note  Procedure: Exchange of externalized biliary drain for a new, modified, int/ext 48F biliary drain.  The new catheter was modified with additional side-holes proximal to the radio-opaque marker.  Complications: None Recommendations:  - Drain is capped.  - If any new fever or pain in the abdomen, may uncap and place to new gravity drainage.  Call for follow up if this is the case.  - Routine exchange in  Weeks. - observe next GI appointment.  - Do not submerge - Once daily saline flushes then re-cap the drain.  - Routine care   Signed,  Dulcy Fanny. Earleen Newport, DO

## 2017-04-03 NOTE — Sedation Documentation (Signed)
Patient is resting comfortably. 

## 2017-04-03 NOTE — Discharge Instructions (Signed)
Biliary Drainage Catheter Home Guide A biliary drainage catheter is a thin, flexible tube that is inserted through your skin into the bile ducts in your liver. Bile is a thick yellow or green fluid that helps digest fat in foods. The purpose of a biliary drainage catheter is to keep bile from backing up into your liver. Backup of bile can occur when there is a blockage that prevents bile from moving from the bile ducts into the small intestine as it should. The blockage can be caused by gallstones, a tumor, or scar tissue. There are three types of biliary drainage:  External biliary drainage. With this type, bile is only drained into a collection bag outside your body (external collection bag).  Internal-external biliary drainage. With this type, bile is drained to an external collection bag as well as into your small intestine.  Internal biliary drainage. With this type, bile is only drained into your small intestine.  General home care includes these daily actions:  Inspection of your drainage catheter.  Flushing your drainage catheter with saline.  Emptying drainage from the collection bag (if present).  Recording the amount of drainage.  Checking the catheter insertion site for signs of infection. Check for: ? Redness, swelling, or pain. ? Fluid or blood. ? Warmth. ? Pus or a bad smell. How do I inspect my drainage catheter?  Check the dressing to make sure that it is dry and clean.  Look at the skin around the drainage catheter when changing the dressing for any problems such as redness, rash, or skin breakdown.  Check the drainage bag to make sure that drainage fluid is flowing into the bag well. Note the color and amount compared to other days.  Check the drainage catheter and bag for any cracks or kinks in the tubing. How do I change my dressing? The dressing over the drainage catheter should be changed every other day, or more often if needed to keep the dressing dry. Your  health care provider will instruct you about how often to change your dressing. Supplies needed:  Mild soap and warm water.  Split gauze pads, 4 x 4 inches (10 x 10 cm) to use as a dressing sponge.  Gauze pads, 4 x 4 inches (10 x 10 cm) or adhesive dressing cover.  Paper tape. How to change the dressing: 1. Wash your hands with soap and water. 2. Gently remove the old dressing. Avoid using scissors to remove the dressing because they may damage the drainage catheter. 3. Wash the skin around the insertion site with mild soap and warm water, rinse well, then pat the area dry with a clean cloth. 4. Check the skin around the drainage catheter for redness or swelling, or for yellow or green discharge that has a bad smell. 5. If the drainage catheter was stitched (sutured) to the skin, inspect the suture to make sure it is still anchored in the skin. 6. Do not apply creams, ointments, or alcohol to the site. Allow the skin to air-dry completely before you apply a new dressing. 7. Place the drainage catheter through the slit in a dressing sponge. The dressing sponge should slide under the disk that holds the drainage catheter in place. 8. Cover the drainage catheter and the dressing sponge with a 4 x 4 inch (10 x 10 cm) gauze. The drainage catheter should rest on the gauze and not on the skin. 9. Tape the dressing to the skin. 10. You may be instructed to use an  adhesive dressing covering over the top of this in place of the gauze and tape. 11. Wash your hands with soap and water. How do I flush my drainage catheter? Biliary drainagecatheters should be flushed daily, or as often as told by your health care provider. The end of the drainage catheter is closed using an IV cap. A syringe can be directly connected to the IV cap. Supplies needed:  Alcohol swab.  10 mL prefilled normal saline syringe. How to flush the drainage catheter: 1. Wash your hands with soap and water. 2. If your drainage  catheter has a stopcock attached to it, turn the stopcock toward the drainage bag. This will allow the saline to flow in the direction of your body. 3. Clean the IV cap with an alcohol swab. 4. Screw the tip of a 10 mL normal saline syringe onto the IV cap. 5. Inject the saline over 5-10 seconds. If you feel resistance while injecting, stop immediately. Avoid  pulling back on the plunger. Doing that could increase your risk of infection. 6. Remove the syringe from the cap. Turn the stopcock so that fluid flows from your body into the drainage bag. You may notice more fluid flowing into the bag after you have completed the flush. How do I attach a bag to my drainage catheter? If you are having trouble with your internal biliary drain, you may be directed by your health care provider to use bag drainage until you can be seen to fix the problem. For this reason, you should always have a collection bag and connecting tubing at home. If you do not have these supplies, remember to ask for them at your next appointment. 1. Remove the bag and the connecting tubing from their packaging. 2. Connect the funnel end of the tubing to the bag's cone-shaped stem. 3. Remove the IV cap from the biliary drain. To do this, unscrew it and replace it with the screw-on end of the tubing. 4.   How do I empty my collection bag? Empty the collection bag whenever it becomes 2/3 full. Also empty it before you go to sleep. Most collection bags have a drainage valve at the bottom so the bag can be that allows them to be emptied easily. 1. Wash your hands with soap and water. 2. Hold the collection bag over the toilet, basin, or collection container. Use a measuring container if your health care provider told you to measure the drainage. 3. Unscrew the valve to open it, and allow the bag to drain. 4. Close the valve securely to avoid leakage. 5. Use a tissue or disposable napkin to wipe the valve clean. 6. Wash the measuring  container with soap and water. 7. Record the amount of drainage as told by your health care provider.  Contact a health care provider if:  Your pain gets worse after it had improved, and it is not relieved with pain medicines.  You have any questions about caring for your drainage catheter or collection bag.  You have any of these around your catheter insertion site or coming from it: ? Skin breakdown. ? Redness, swelling, or pain. ? Fluid or blood. ? Warmth to the touch. ? Pus or a bad smell. Get help right away if:  You have a fever or chills.  Your redness, swelling, or pain at the catheter insertion site gets worse, even though you are cleaning it well.  You have leakage of bile around the drainage catheter.  Your drainage catheter becomes  blocked or clogged.  Your drainage catheter comes out. This information is not intended to replace advice given to you by your health care provider. Make sure you discuss any questions you have with your health care provider. Document Released: 04/03/2013 Document Revised: 05/02/2016 Document Reviewed: 05/02/2016 Elsevier Interactive Patient Education  2017 [place].   Moderate Conscious Sedation, Adult, Care After These instructions provide you with information about caring for yourself after your procedure. Your health care provider may also give you more specific instructions. Your treatment has been planned according to current medical practices, but problems sometimes occur. Call your health care provider if you have any problems or questions after your procedure. What can I expect after the procedure? After your procedure, it is common:  To feel sleepy for several hours.  To feel clumsy and have poor balance for several hours.  To have poor judgment for several hours.  To vomit if you eat too soon.  Follow these instructions at home: For at least 24 hours after the procedure:   Do not: ? Participate in activities  where you could fall or become injured. ? Drive. ? Use heavy machinery. ? Drink alcohol. ? Take sleeping pills or medicines that cause drowsiness. ? Make important decisions or sign legal documents. ? Take care of children on your own.  Rest. Eating and drinking  Follow the diet recommended by your health care provider.  If you vomit: ? Drink water, juice, or soup when you can drink without vomiting. ? Make sure you have little or no nausea before eating solid foods. General instructions  Have a responsible adult stay with you until you are awake and alert.  Take over-the-counter and prescription medicines only as told by your health care provider.  If you smoke, do not smoke without supervision.  Keep all follow-up visits as told by your health care provider. This is important. Contact a health care provider if:  You keep feeling nauseous or you keep vomiting.  You feel light-headed.  You develop a rash.  You have a fever. Get help right away if:  You have trouble breathing. This information is not intended to replace advice given to you by your health care provider. Make sure you discuss any questions you have with your health care provider. Document Released: 04/03/2013 Document Revised: 11/16/2015 Document Reviewed: 10/03/2015 Elsevier Interactive Patient Education  [employer].

## 2017-04-03 NOTE — H&P (Signed)
Chief Complaint: Patient was seen in consultation today for biliary drain exchange at the request of Dr Madelon Lips  Referring Physician(s): Dr Madelon Lips Supervising Physician: Corrie Mckusick  Patient Status: [facility] - Out-pt  History of Present Illness: Peggy Armstrong is a 71 y.o. female   Hx cholangitis Duodenal mass Biliary obstruction with external Biliary drain placed 03/24/17 in IR Scheduled now for exchange of drain ---possible internal /external drain placement  Pt doing well Denies N/V Keeping  drain clean and flushing daily   Past Medical History:  Diagnosis Date  . Clotting disorder ([place])    right DVT  . Gout   . Gout   . Hypertension   . RA (rheumatoid arthritis) ([place])     Past Surgical History:  Procedure Laterality Date  . ABDOMINAL HYSTERECTOMY    . CHOLECYSTECTOMY    . ESOPHAGOGASTRODUODENOSCOPY (EGD) WITH PROPOFOL N/A 03/24/2017   Procedure: ESOPHAGOGASTRODUODENOSCOPY (EGD) WITH PROPOFOL;  Surgeon: Doran Stabler, MD;  Location: [facility] ENDOSCOPY;  Service: Gastroenterology;  Laterality: N/A;  . HERNIA REPAIR    . IR BILIARY DRAIN PLACEMENT WITH CHOLANGIOGRAM  03/25/2017    Allergies: Codeine  Medications: Prior to Admission medications   Medication Sig Start Date End Date Taking? Authorizing Provider  albuterol (PROVENTIL HFA;VENTOLIN HFA) 108 (90 Base) MCG/ACT inhaler Inhale 2 puffs into the lungs every 6 (six) hours as needed for wheezing or shortness of breath.  10/28/16 10/28/17  [provider]  allopurinol (ZYLOPRIM) 300 MG tablet Take 300 mg by mouth daily.  05/06/15   [provider]  ALPRAZolam Duanne Moron) 0.25 MG tablet Take 0.25 mg by mouth at bedtime.  07/21/15   [provider]  amiodarone (PACERONE) 400 MG tablet - continue oral amiodarone 400 mg twice a day through 04/05/2017 - At that time plan to reduce dose to 400 mg by mouth daily for 1 week, continue 200 mg daily until outpatient follow-up 03/30/17   Theodis Blaze,  MD  apixaban (ELIQUIS) 5 MG TABS tablet Take 1 tablet (5 mg total) by mouth 2 (two) times daily. Stop taking this medication on October 9th, 2018. 03/30/17   Theodis Blaze, MD  chlorthalidone (HYGROTON) 25 MG tablet Take 25 mg by mouth daily. 07/22/15   [provider]  cholecalciferol (VITAMIN D) 1000 units tablet Take 1,000 Units by mouth daily.    [provider]  diltiazem (CARDIZEM CD) 300 MG 24 hr capsule Take 300 mg by mouth daily. 07/22/15   [provider]  doxycycline (VIBRA-TABS) 100 MG tablet Take 1 tablet (100 mg total) by mouth 2 (two) times daily. 04/01/17   Theodis Blaze, MD  levalbuterol Penne Lash) 0.63 MG/3ML nebulizer solution Take 3 mLs (0.63 mg total) by nebulization every 6 (six) hours as needed for wheezing or shortness of breath. 03/30/17   Theodis Blaze, MD  ondansetron (ZOFRAN) 4 MG tablet Take 1 tablet (4 mg total) by mouth every 6 (six) hours as needed for nausea. 03/30/17   Theodis Blaze, MD  oxyCODONE (OXY IR/ROXICODONE) 5 MG immediate release tablet Take 1 tablet (5 mg total) by mouth every 4 (four) hours as needed for moderate pain. 03/30/17   Theodis Blaze, MD  pantoprazole (PROTONIX) 40 MG tablet Take 1 tablet (40 mg total) by mouth daily. 03/31/17   Theodis Blaze, MD  polyethylene glycol [facility] / Floria Raveling) packet Take 17 g by mouth daily. 03/31/17   Theodis Blaze, MD  senna-docusate (SENOKOT-S) 8.6-50 MG  tablet Take 1 tablet by mouth at bedtime as needed for mild constipation. 03/30/17   Theodis Blaze, MD  vitamin B-12 (CYANOCOBALAMIN) 1000 MCG tablet Take 1,000 mcg by mouth daily.    [provider]     Family History  Problem Relation Age of Onset  . Cancer Father        prostate ca    Social History   Social History  . Marital status: Divorced    Spouse name: N/A  . Number of children: N/A  . Years of education: N/A   Social History Main Topics  . Smoking status: Never Smoker  . Smokeless tobacco: Never Used  .  Alcohol use No  . Drug use: No  . 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   Other Topics Concern  . None   Social History Narrative  . None    Review of Systems: A 12 point ROS discussed and pertinent positives are indicated in the HPI above.  All other systems are negative.  Review of Systems  Constitutional: Positive for fatigue. Negative for activity change.  Respiratory: Positive for cough.   Cardiovascular: Negative for chest pain.  Gastrointestinal: Negative for abdominal pain.  Neurological: Positive for weakness.  Psychiatric/Behavioral: Negative for behavioral problems and confusion.    Vital Signs: BP 139/89   Pulse 79   Temp 97.8 F (36.6 C)   Resp 18   Ht '5\' 1"'  (1.549 m)   Wt 241 lb (109.3 kg)   SpO2 97%   BMI 45.54 kg/m   Physical Exam  Constitutional: She is oriented to person, place, and time.  Cardiovascular: Regular rhythm.   irreg rate  Pulmonary/Chest: Effort normal. She has wheezes.  Right upper wheezes  Abdominal: Soft. Bowel sounds are normal. There is no tenderness.  Musculoskeletal: Normal range of motion.  Neurological: She is alert and oriented to person, place, and time.  Skin: Skin is warm and dry.  Psychiatric: She has a normal mood and affect. Her behavior is normal. Judgment and thought content normal.  Nursing note and vitals reviewed.   Imaging: Dg Chest 2 View  Result Date: 03/28/2017 CLINICAL DATA:  Dyspnea and weakness EXAM: CHEST  2 VIEW COMPARISON:  None in PACs FINDINGS: The lungs are mildly hypoinflated. The lung markings are coarse at both bases greatest on the left. There are small bilateral pleural effusions. Cardiac silhouette is enlarged. The pulmonary vascularity is not clearly engorged. There is calcification in the wall of the aortic arch. There is multilevel degenerative disc disease of the thoracic spine. IMPRESSION: Mild cardiomegaly without significant pulmonary vascular congestion.  Bibasilar atelectasis or pneumonia with small bilateral pleural effusions. Thoracic aortic atherosclerosis. Electronically Signed   By: David  Martinique M.D.   On: 03/28/2017 13:19   Ct Chest Wo Contrast  Result Date: 03/29/2017 CLINICAL DATA:  71 year old female with fever of unknown origin. Shortness of breath. Intermittent cough. Cholangitis status post percutaneous transhepatic external drainage on 03/24/2017. EXAM: CT CHEST WITHOUT CONTRAST TECHNIQUE: Multidetector CT imaging of the chest was performed following the standard protocol without IV contrast. COMPARISON:  Chest radiographs 03/28/2017. Abdomen MRI 03/23/2017. CT Abdomen and Pelvis 03/22/2017. FINDINGS: Cardiovascular: Stable cardiomegaly. Calcified coronary artery and aortic atherosclerosis is evident. Vascular patency is not evaluated in the absence of IV contrast. Mild enlargement of the central pulmonary arteries. No pathologic pericardial effusion. Mediastinum/Nodes: 3 cm rounded area of Low-density (16 Hounsfield units) in the mediastinum at the anterior carina  space might be an unusual accumulation of fluid in the superior pericardial recess. Other mediastinal lymph nodes appear within normal limits. No definite hilar lymphadenopathy. Mostly fat containing hiatal hernia is stable from the recent abdomen CT. Lungs/Pleura: New small to moderate size layering bilateral pleural effusions since 03/23/2017. Major airways are patent. There is linear atelectasis in the left upper lobe, and compressive atelectasis in both lower lobes with minimal air bronchograms. Lung parenchyma elsewhere is within normal limits. Upper Abdomen: Left lobe approach percutaneous biliary drain is partially visible. No upper abdominal free fluid or free air. The visible intrahepatic bile ducts now appear decompressed. Otherwise stable visualized upper abdominal viscera. Musculoskeletal: No acute osseous abnormality identified. IMPRESSION: 1. Small to moderate layering  bilateral pleural effusions are new since 03/23/2017. Superimposed pulmonary atelectasis with no definite pneumonia. 2. Rounded but fluid density 3 cm area in the mediastinum anterior to the carina is favored to be pericardial fluid in a superior pericardial recess (normal variant). A follow-up Chest CT with IV contrast (such as in 3 months time) is recommended to document stability. 3. Partially visible upper abdomen transhepatic biliary drain with no adverse features. 4. Cardiomegaly. Electronically Signed   By: Genevie Ann M.D.   On: 03/29/2017 14:48   Ct Abdomen Pelvis W Contrast  Result Date: 03/22/2017 CLINICAL DATA:  Mid abdominal pain and right upper quadrant pain with nausea and vomiting EXAM: CT ABDOMEN AND PELVIS WITH CONTRAST TECHNIQUE: Multidetector CT imaging of the abdomen and pelvis was performed using the standard protocol following bolus administration of intravenous contrast. CONTRAST:  61m ISOVUE-300 IOPAMIDOL (ISOVUE-300) INJECTION 61% COMPARISON:  None. FINDINGS: Lower chest: Lung bases demonstrate linear scarring. No acute consolidation or pleural effusion. Normal heart size. Hepatobiliary: Mild intra hepatic and moderate extrahepatic biliary dilatation with common duct measuring up to 18 mm. Surgical absence of the gallbladder. Pancreas: Unremarkable. No pancreatic ductal dilatation or surrounding inflammatory changes. Spleen: Normal in size without focal abnormality. Adrenals/Urinary Tract: Adrenal glands are within normal limits. Small cyst in the left kidney. No hydronephrosis. The bladder is normal Stomach/Bowel: The stomach is nonenlarged. No dilated small bowel. No colon wall thickening. Sigmoid colon diverticular disease without acute inflammation Vascular/Lymphatic: Aortic atherosclerosis. No enlarged abdominal or pelvic lymph nodes. Reproductive: Status post hysterectomy. No adnexal masses. Other: Negative for free air or free fluid. Musculoskeletal: Degenerative changes. 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 IMPRESSION: 1. Mild intra hepatic and moderate extrahepatic biliary dilatation, post cholecystectomy. Recommend correlation with laboratory values, follow-up MR or ERCP as indicated 2. Sigmoid colon diverticular disease without acute inflammation. Electronically Signed   By: KDonavan FoilM.D.   On: 03/22/2017 21:48   Mr Abdomen Mrcp Wo Contrast  Result Date: 03/23/2017 CLINICAL DATA:  Abdominal pain with nausea and vomiting. Biliary duct dilatation on CT. EXAM: MRI ABDOMEN WITHOUT CONTRAST  (INCLUDING MRCP) TECHNIQUE: Multiplanar multisequence MR imaging of the abdomen was performed. Heavily T2-weighted images of the biliary and pancreatic ducts were obtained, and three-dimensional MRCP images were rendered by post processing. COMPARISON:  Yesterday CT FINDINGS: Mild to moderate motion degradation throughout. Lower chest: Mild cardiomegaly.  Small hiatal hernia. Hepatobiliary: No focal liver lesion. Mild decreased signal on inphase imaging, suggesting iron deposition in the liver. Moderate intrahepatic biliary duct dilatation. The left hepatic duct measures 10 mm on image 81/series 4. Moderate common duct dilatation, with the common duct measuring maximally 1.6 cm in the porta hepatis on image 86/ series 4. Tapers gradually distally. No choledocholithiasis. No well-defined obstructive  mass. Pancreas: Pancreatic atrophy. Duct size upper normal, including at 4 mm on image 71/ series 4. No acute pancreatitis. Spleen:  Normal in size, without focal abnormality. Adrenals/Urinary Tract: Normal adrenal glands. Mild renal cortical thinning bilaterally. Upper pole 1.6 cm left renal cyst. An interpolar 6 mm T1 hyperintense left renal lesion on image 66/series 12 is likely a complex cyst, but is incompletely characterized. Stomach/Bowel: Normal remainder of the stomach and abdominal bowel loops. Vascular/Lymphatic: Aortic and branch vessel atherosclerosis. Upper normal size porta hepatis  nodes are likely reactive. Multiple small retroperitoneal nodes, none of which are pathologic by size criteria. Other:  No ascites. Musculoskeletal: No acute osseous abnormality. IMPRESSION: 1. Mild to moderately motion degraded exam. 2. Biliary duct dilatation after cholecystectomy. No choledocholithiasis or well-defined obstructive mass. Given the elevated bilirubin, consider further evaluation with ERCP to exclude ampullary stricture or otherwise occult ampullary lesion. 3. Hiatal hernia. 4. 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 Electronically Signed   By: Abigail Miyamoto M.D.   On: 03/23/2017 07:52   Mr 3d Recon At Scanner  Result Date: 03/23/2017 CLINICAL DATA:  Abdominal pain with nausea and vomiting. Biliary duct dilatation on CT. EXAM: MRI ABDOMEN WITHOUT CONTRAST  (INCLUDING MRCP) TECHNIQUE: Multiplanar multisequence MR imaging of the abdomen was performed. Heavily T2-weighted images of the biliary and pancreatic ducts were obtained, and three-dimensional MRCP images were rendered by post processing. COMPARISON:  Yesterday CT FINDINGS: Mild to moderate motion degradation throughout. Lower chest: Mild cardiomegaly.  Small hiatal hernia. Hepatobiliary: No focal liver lesion. Mild decreased signal on inphase imaging, suggesting iron deposition in the liver. Moderate intrahepatic biliary duct dilatation. The left hepatic duct measures 10 mm on image 81/series 4. Moderate common duct dilatation, with the common duct measuring maximally 1.6 cm in the porta hepatis on image 86/ series 4. Tapers gradually distally. No choledocholithiasis. No well-defined obstructive mass. Pancreas: Pancreatic atrophy. Duct size upper normal, including at 4 mm on image 71/ series 4. No acute pancreatitis. Spleen:  Normal in size, without focal abnormality. Adrenals/Urinary Tract: Normal adrenal glands. Mild renal cortical thinning bilaterally. Upper pole 1.6 cm left renal cyst. An interpolar 6 mm T1 hyperintense left renal lesion on image  66/series 12 is likely a complex cyst, but is incompletely characterized. Stomach/Bowel: Normal remainder of the stomach and abdominal bowel loops. Vascular/Lymphatic: Aortic and branch vessel atherosclerosis. Upper normal size porta hepatis nodes are likely reactive. Multiple small retroperitoneal nodes, none of which are pathologic by size criteria. Other:  No ascites. Musculoskeletal: No acute osseous abnormality. IMPRESSION: 1. Mild to moderately motion degraded exam. 2. Biliary duct dilatation after cholecystectomy. No choledocholithiasis or well-defined obstructive mass. Given the elevated bilirubin, consider further evaluation with ERCP to exclude ampullary stricture or otherwise occult ampullary lesion. 3. Hiatal hernia. 4. 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 Electronically Signed   By: Abigail Miyamoto M.D.   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 ANESTHESIA/SEDATION: Moderate (conscious) sedation was employed during this procedure. A total of Versed 2.0 mg and Fentanyl 100 mcg was administered intravenously. Moderate Sedation Time: 50 minutes. The patient's level of consciousness and vital signs were monitored continuously by radiology nursing throughout the procedure under my direct supervision. FLUOROSCOPY TIME:  Fluoroscopy Time: 5  minutes 12 seconds (218 mGy). COMPLICATIONS: None PROCEDURE: The procedure, risks, benefits, and alternatives were explained to the patient and the patient's family. A complete informed consent was performed, with risk benefit analysis. Specific risks that were discussed for the procedure include bleeding, infection, biliary  sepsis, IC use day, organ injury, need for further procedure, need for further surgery, long-term drain placement, cardiopulmonary collapse, death. Questions regarding the procedure were encouraged and answered. The patient understands and consents to the procedure. Patient is position in supine position on the fluoroscopy table, and the upper abdomen was prepped and draped in the usual sterile fashion. Maximum barrier sterile technique with sterile gowns and gloves were used for the procedure. A timeout was performed prior to the initiation of the procedure. Local anesthesia was provided with 1% lidocaine with epinephrine. Ultrasound survey of the left liver lobe was performed, with then ultrasound of the right liver lobe. 1% lidocaine was used for local anesthesia, with generous infiltration of the skin and subcutaneous tissues in intercostal location. A Chiba needle was advanced under ultrasound guidance into the right liver lobe. Traditional right-sided PTC unsuccessful with 3 attempts of identifying biliary system. Ultrasound guided access was then used for access into the left-sided biliary system, subcostal approach. Once the tip of this needle was confirmed within the biliary system, an 018 wire was advanced centrally. The needle was removed, a small incision was made with an 11 blade scalpel, and then a triaxial Accustick system was advanced into the biliary system. The metal stiffener and dilator were removed, we confirmed placement with contrast infusion. A coaxial Glidewire and 4 French glide cath were then used to navigate across the obstruction at the hilum of the liver. Once the catheter was presumed to be within the duodenum, the wire was removed and contrast confirmed location. A Coons wire was advanced through the system, and the Accustick and Glidewire were removed. Dilation of the subcutaneous tissue tracks was performed with an 8 [place] and then 10 [place] dilator, and then a 10 [place] biliary  drain was placed. Upon positioning of the catheter, the pigtail withdrew into the distal common bile duct, which demonstrated tortuosity. The patient tolerated the procedure well and remained hemodynamically stable throughout. No complications were encountered and no significant blood loss was encountered. FINDINGS: Dilated intrahepatic ductal system. Tortuous extrahepatic biliary ductal system with obstruction the distal common bile duct. Upon placement of a standard 10 [place] biliary tube, the pigtail catheter withdrew into the distal common bile duct. In order to adequately position sideholes within the dilated intrahepatic ductal system for decompression, we elected to leave the pigtail catheter as an external drain only. The patient will likely need a modified drain for additional length and sideholes after recovery from her episode of acute cholangitis. IMPRESSION: Status post percutaneous transhepatic cholangiogram with placement of a left approach externalized biliary drain. Signed, Dulcy Fanny. Earleen Newport, DO [facility] PLAN: Given the tortuosity in length of the intrahepatic and extrahepatic biliary ductal system, the patient will likely need a modified drain placed via the left biliary approach, in order to position an adequate number of sideholes within the intrahepatic ductal system, and position a drain across the lesion into the duodenum. Plan for repeat through the tube cholangiogram and drain exchange after her initial recovery in a few days to a week. Electronically Signed   By: Corrie Mckusick D.O.   On: 03/25/2017 09:58    Labs:  CBC:  Recent Labs  03/27/17  0320 03/28/17 0316 03/29/17 0459 03/30/17 0201  WBC 11.3* 9.6 7.9 6.6  HGB 10.4* 9.8* 10.0* 9.8*  HCT 33.9* 31.6* 33.4* 31.2*  PLT 212 220 198 188    COAGS:  Recent Labs  03/23/17 0121 03/24/17 0717  INR 1.52 1.30  APTT 39*  --     BMP:  Recent Labs   03/27/17 0320 03/28/17 0316 03/29/17 0459 03/30/17 0201  NA 138 137 138 138  K 3.2* 3.2* 3.9 3.5  CL 98* 99* 100* 103  CO2 '29 28 27 28  ' GLUCOSE 90 87 92 98  BUN '18 18 16 14  ' CALCIUM 8.4* 8.2* 8.2* 8.0*  CREATININE 1.39* 1.22* 1.17* 1.16*  GFRNONAA 37* 43* 46* 46*  GFRAA 43* 50* 53* 54*    LIVER FUNCTION TESTS:  Recent Labs  03/25/17 0623 03/26/17 0149 03/28/17 0316 03/29/17 0459  BILITOT 1.8* 1.2 0.9 1.0  AST 65* 33 16 22  ALT 99* 74* 32 27  ALKPHOS 200* 188* 143* 133*  PROT 5.6* 6.4* 6.0* 6.1*  ALBUMIN 2.5* 2.8* 2.6* 2.8*    TUMOR MARKERS: No results for input(s): AFPTM, CEA, CA199, CHROMGRNA in the last 8760 hours.  Assessment and Plan:  Cholangitis Duodenal mass Biliary obstruction with external biliary drain placed 9/28 Scheduled now for replacement and possible internal/external drain placement Pt and family aware of procedure benefits and risks including but not limited to Infection; bleeding; damage to biliary ducts or surrounding tissues. Agreeable to proceed Consent signed andin chart  Thank you for this interesting consult.  I greatly enjoyed meeting Peggy Armstrong and look forward to participating in their care.  A copy of this report was sent to the requesting provider on this date.  Electronically Signed: Lavonia Drafts, PA-C 04/03/2017, 10:37 AM   I spent a total of  30 Minutes   in face to face in clinical consultation, greater than 50% of which was counseling/coordinating care for external biliary drain exchange

## 2017-04-04 ENCOUNTER — Encounter (HOSPITAL_COMMUNITY): Payer: Self-pay | Admitting: *Deleted

## 2017-04-04 ENCOUNTER — Other Ambulatory Visit (HOSPITAL_COMMUNITY): Payer: Self-pay | Admitting: *Deleted

## 2017-04-04 NOTE — Progress Notes (Signed)
Spoke with dr Gwyndolyn Saxon fitzgerald anesthesia made aware patient medical history, labs from 04-03-17 cnc, cmet, pt, ptt, ekg from 03-26-17, chest ct from 03-29-17. Order received from dr Gwyndolyn Saxon fitzgerald to repeat bmet and do ekg day of procedure 04-06-17. Orders placed in epic.

## 2017-04-06 ENCOUNTER — Ambulatory Visit (HOSPITAL_COMMUNITY): Payer: Medicare Other | Admitting: Anesthesiology

## 2017-04-06 ENCOUNTER — Encounter (HOSPITAL_COMMUNITY): Payer: Self-pay | Admitting: Emergency Medicine

## 2017-04-06 ENCOUNTER — Ambulatory Visit (HOSPITAL_COMMUNITY)
Admission: RE | Admit: 2017-04-06 | Discharge: 2017-04-06 | Disposition: A | Discharge status: Home / Self Care | Payer: Medicare Other | Source: Ambulatory Visit | Attending: Gastroenterology | Admitting: Gastroenterology

## 2017-04-06 ENCOUNTER — Encounter (HOSPITAL_COMMUNITY)
Admission: RE | Disposition: A | Discharge status: Home / Self Care | Payer: Self-pay | Source: Ambulatory Visit | Attending: Gastroenterology

## 2017-04-06 DIAGNOSIS — K317 Polyp of stomach and duodenum: Secondary | ICD-10-CM | POA: Diagnosis not present

## 2017-04-06 DIAGNOSIS — B961 Klebsiella pneumoniae [K. pneumoniae] as the cause of diseases classified elsewhere: Secondary | ICD-10-CM | POA: Insufficient documentation

## 2017-04-06 DIAGNOSIS — K8309 Other cholangitis: Secondary | ICD-10-CM | POA: Diagnosis not present

## 2017-04-06 DIAGNOSIS — Z86718 Personal history of other venous thrombosis and embolism: Secondary | ICD-10-CM | POA: Diagnosis not present

## 2017-04-06 DIAGNOSIS — K829 Disease of gallbladder, unspecified: Secondary | ICD-10-CM | POA: Diagnosis present

## 2017-04-06 DIAGNOSIS — K833 Fistula of bile duct: Secondary | ICD-10-CM

## 2017-04-06 DIAGNOSIS — D132 Benign neoplasm of duodenum: Secondary | ICD-10-CM | POA: Diagnosis not present

## 2017-04-06 DIAGNOSIS — R7881 Bacteremia: Secondary | ICD-10-CM | POA: Insufficient documentation

## 2017-04-06 DIAGNOSIS — Z6841 Body Mass Index (BMI) 40.0 and over, adult: Secondary | ICD-10-CM | POA: Diagnosis not present

## 2017-04-06 DIAGNOSIS — I4892 Unspecified atrial flutter: Secondary | ICD-10-CM | POA: Insufficient documentation

## 2017-04-06 DIAGNOSIS — I4891 Unspecified atrial fibrillation: Secondary | ICD-10-CM | POA: Diagnosis not present

## 2017-04-06 DIAGNOSIS — M109 Gout, unspecified: Secondary | ICD-10-CM | POA: Diagnosis not present

## 2017-04-06 DIAGNOSIS — Z7901 Long term (current) use of anticoagulants: Secondary | ICD-10-CM | POA: Insufficient documentation

## 2017-04-06 DIAGNOSIS — M069 Rheumatoid arthritis, unspecified: Secondary | ICD-10-CM | POA: Diagnosis not present

## 2017-04-06 DIAGNOSIS — D649 Anemia, unspecified: Secondary | ICD-10-CM | POA: Diagnosis not present

## 2017-04-06 DIAGNOSIS — I10 Essential (primary) hypertension: Secondary | ICD-10-CM | POA: Insufficient documentation

## 2017-04-06 DIAGNOSIS — K3189 Other diseases of stomach and duodenum: Secondary | ICD-10-CM | POA: Diagnosis not present

## 2017-04-06 DIAGNOSIS — D490 Neoplasm of unspecified behavior of digestive system: Secondary | ICD-10-CM

## 2017-04-06 HISTORY — PX: EUS: SHX5427

## 2017-04-06 SURGERY — UPPER ENDOSCOPIC ULTRASOUND (EUS) LINEAR
Anesthesia: Monitor Anesthesia Care

## 2017-04-06 MED ORDER — PROPOFOL 10 MG/ML IV BOLUS
INTRAVENOUS | Status: DC | PRN
  Administered 2017-04-06 (×3): 20 mg via INTRAVENOUS

## 2017-04-06 MED ORDER — ONDANSETRON HCL 4 MG/2ML IJ SOLN
INTRAMUSCULAR | Status: AC
  Filled 2017-04-06: qty 2

## 2017-04-06 MED ORDER — MIDAZOLAM HCL 5 MG/5ML IJ SOLN
INTRAMUSCULAR | Status: DC | PRN
  Administered 2017-04-06: 1 mg via INTRAVENOUS

## 2017-04-06 MED ORDER — LACTATED RINGERS IV SOLN
INTRAVENOUS | Status: DC
  Administered 2017-04-06: 10:00:00 via INTRAVENOUS

## 2017-04-06 MED ORDER — MIDAZOLAM HCL 2 MG/2ML IJ SOLN
INTRAMUSCULAR | Status: AC
  Filled 2017-04-06: qty 2

## 2017-04-06 MED ORDER — PROPOFOL 10 MG/ML IV BOLUS
INTRAVENOUS | Status: AC
  Filled 2017-04-06: qty 60

## 2017-04-06 MED ORDER — LIDOCAINE 2% (20 MG/ML) 5 ML SYRINGE
INTRAMUSCULAR | Status: DC | PRN
  Administered 2017-04-06: 100 mg via INTRAVENOUS

## 2017-04-06 MED ORDER — ONDANSETRON HCL 4 MG/2ML IJ SOLN
INTRAMUSCULAR | Status: DC | PRN
  Administered 2017-04-06: 4 mg via INTRAVENOUS

## 2017-04-06 MED ORDER — LIDOCAINE 2% (20 MG/ML) 5 ML SYRINGE
INTRAMUSCULAR | Status: AC
  Filled 2017-04-06: qty 5

## 2017-04-06 MED ORDER — PROPOFOL 500 MG/50ML IV EMUL
INTRAVENOUS | Status: DC | PRN
  Administered 2017-04-06: 140 ug/kg/min via INTRAVENOUS

## 2017-04-06 NOTE — Anesthesia Procedure Notes (Signed)
Date/Time: 04/06/2017 10:40 AM Performed by: Danley Danker L Oxygen Delivery Method: Simple face mask

## 2017-04-06 NOTE — Transfer of Care (Signed)
Immediate Anesthesia Transfer of Care Note  Patient: Peggy Armstrong  Procedure(s) Performed: UPPER ENDOSCOPIC ULTRASOUND (EUS) LINEAR (N/A )  Patient Location: Endoscopy Unit  Anesthesia Type:MAC  Level of Consciousness: awake, alert  and oriented  Airway & Oxygen Therapy: Patient Spontanous Breathing and Patient connected to nasal cannula oxygen  Post-op Assessment: Report given to RN and Post -op Vital signs reviewed and stable  Post vital signs: Reviewed and stable  Last Vitals:  Vitals:   04/06/17 0959  BP: (!) 144/75  Pulse: 68  Resp: (!) 21  Temp: (!) 36.1 C  SpO2: 94%    Last Pain:  Vitals:   04/06/17 0959  TempSrc: Oral         Complications: No apparent anesthesia complications

## 2017-04-06 NOTE — Anesthesia Preprocedure Evaluation (Addendum)
Anesthesia Evaluation  Patient identified by MRN, date of birth, ID band Patient awake    Reviewed: Allergy & Precautions, NPO status , Patient's Chart, lab work & pertinent test results  Airway Mallampati: II  TM Distance: >3 FB Neck ROM: Full    Dental  (+) Edentulous Upper, Edentulous Lower   Pulmonary neg pulmonary ROS,    Pulmonary exam normal breath sounds clear to auscultation       Cardiovascular hypertension, Pt. on medications Normal cardiovascular exam Rhythm:Regular Rate:Normal  ECHO: LV EF: 60% -   65%   Neuro/Psych negative neurological ROS  negative psych ROS   GI/Hepatic negative GI ROS, Neg liver ROS,   Endo/Other  Morbid obesity  Renal/GU negative Renal ROS     Musculoskeletal  (+) Arthritis , Rheumatoid disorders,    Abdominal   Peds  Hematology  (+) anemia , right DVT   Anesthesia Other Findings Gout  Reproductive/Obstetrics                            Anesthesia Physical  Anesthesia Plan  ASA: III  Anesthesia Plan: MAC   Post-op Pain Management:    Induction: Intravenous  PONV Risk Score and Plan: 3 and Propofol infusion  Airway Management Planned: Natural Airway  Additional Equipment:   Intra-op Plan:   Post-operative Plan:   Informed Consent: I have reviewed the patients History and Physical, chart, labs and discussed the procedure including the risks, benefits and alternatives for the proposed anesthesia with the patient or authorized representative who has indicated his/her understanding and acceptance.   Dental advisory given  Plan Discussed with: CRNA  Anesthesia Plan Comments:         Anesthesia Quick Evaluation

## 2017-04-06 NOTE — Discharge Instructions (Signed)

## 2017-04-06 NOTE — Anesthesia Postprocedure Evaluation (Signed)
Anesthesia Post Note  Patient: Peggy Armstrong  Procedure(s) Performed: UPPER ENDOSCOPIC ULTRASOUND (EUS) LINEAR (N/A )     Patient location during evaluation: PACU Anesthesia Type: MAC Level of consciousness: awake and alert Pain management: pain level controlled Vital Signs Assessment: post-procedure vital signs reviewed and stable Respiratory status: spontaneous breathing, nonlabored ventilation, respiratory function stable and patient connected to nasal cannula oxygen Cardiovascular status: stable and blood pressure returned to baseline Postop Assessment: no apparent nausea or vomiting Anesthetic complications: no    Last Vitals:  Vitals:   04/06/17 1150 04/06/17 1200  BP: 118/64 127/63  Pulse: 61 63  Resp: (!) 23 20  Temp:    SpO2: 94% 93%    Last Pain:  Vitals:   04/06/17 1137  TempSrc: Oral                 Ryan P Ellender

## 2017-04-06 NOTE — Interval H&P Note (Signed)
History and Physical Interval Note:  04/06/2017 10:12 AM  Peggy Armstrong  has presented today for surgery, with the diagnosis of Duodenal adenoma  The various methods of treatment have been discussed with the patient and family. After consideration of risks, benefits and other options for treatment, the patient has consented to  Procedure(s) with comments: UPPER ENDOSCOPIC ULTRASOUND (EUS) LINEAR (N/A) - to evaluate duodenal mass as a surgical intervention .  The patient's history has been reviewed, patient examined, no change in status, stable for surgery.  I have reviewed the patient's chart and labs.  Questions were answered to the patient's satisfaction.     [name]

## 2017-04-06 NOTE — H&P (View-Only) (Signed)
[facility] Progress Note  CC:  Biliary obstruction  Subjective:  Says that she feels night and day better from yesterday in regards to her cough.  Had a temp of 101.2 this AM, however.  Antibiotics switched to cefepime yesterday.  Also tachy in the 140's earlier.  Wants to go home today.  Objective:  Vital signs in last 24 hours: Temp:  [98.1 F (36.7 C)-99.1 F (37.3 C)] 99.1 F (37.3 C) (10/04 0551) Pulse Rate:  [75-147] 87 (10/04 0551) Resp:  [18] 18 (10/04 0551) BP: (112-135)/(62-89) 128/74 (10/04 0551) SpO2:  [96 %-100 %] 100 % (10/04 0551) Last BM Date: 03/29/17 General:  Alert, Well-developed, in NAD Heart:  Regular rate and rhythm; no murmurs Pulm:  Some B/L wheezing noted posteriorly. Abdomen:  Soft, non-distended.  BS present.  Non-tender.  Biliary drain noted.  Extremities:  B/L LE edema with skin changes noted. Neurologic:  Alert and oriented x 4;  grossly normal neurologically. Psych:  Alert and cooperative. Normal mood and affect.  Intake/Output from previous day: 10/03 0701 - 10/04 0700 In: 528.7 [P.O.:360; I.V.:118.7; IV Piggyback:50] Out: 445 [Drains:445]  Lab Results:  Recent Labs  03/28/17 0316 03/29/17 0459 03/30/17 0201  WBC 9.6 7.9 6.6  HGB 9.8* 10.0* 9.8*  HCT 31.6* 33.4* 31.2*  PLT 220 198 188   BMET  Recent Labs  03/28/17 0316 03/29/17 0459 03/30/17 0201  NA 137 138 138  K 3.2* 3.9 3.5  CL 99* 100* 103  CO2 '28 27 28  ' GLUCOSE 87 92 98  BUN '18 16 14  ' CREATININE 1.22* 1.17* 1.16*  CALCIUM 8.2* 8.2* 8.0*   LFT  Recent Labs  03/29/17 0459  PROT 6.1*  ALBUMIN 2.8*  AST 22  ALT 27  ALKPHOS 133*  BILITOT 1.0   Dg Chest 2 View  Result Date: 03/28/2017 CLINICAL DATA:  Dyspnea and weakness EXAM: CHEST  2 VIEW COMPARISON:  None in PACs FINDINGS: The lungs are mildly hypoinflated. The lung markings are coarse at both bases greatest on the left. There are small bilateral pleural effusions. Cardiac silhouette is  enlarged. The pulmonary vascularity is not clearly engorged. There is calcification in the wall of the aortic arch. There is multilevel degenerative disc disease of the thoracic spine. IMPRESSION: Mild cardiomegaly without significant pulmonary vascular congestion. Bibasilar atelectasis or pneumonia with small bilateral pleural effusions. Thoracic aortic atherosclerosis. Electronically Signed   By: David  Martinique M.D.   On: 03/28/2017 13:19   Ct Chest Wo Contrast  Result Date: 03/29/2017 CLINICAL DATA:  71 year old female with fever of unknown origin. Shortness of breath. Intermittent cough. Cholangitis status post percutaneous transhepatic external drainage on 03/24/2017. EXAM: CT CHEST WITHOUT CONTRAST TECHNIQUE: Multidetector CT imaging of the chest was performed following the standard protocol without IV contrast. COMPARISON:  Chest radiographs 03/28/2017. Abdomen MRI 03/23/2017. CT Abdomen and Pelvis 03/22/2017. FINDINGS: Cardiovascular: Stable cardiomegaly. Calcified coronary artery and aortic atherosclerosis is evident. Vascular patency is not evaluated in the absence of IV contrast. Mild enlargement of the central pulmonary arteries. No pathologic pericardial effusion. Mediastinum/Nodes: 3 cm rounded area of Low-density (16 Hounsfield units) in the mediastinum at the anterior carina space might be an unusual accumulation of fluid in the superior pericardial recess. Other mediastinal lymph nodes appear within normal limits. No definite hilar lymphadenopathy. Mostly fat containing hiatal hernia is stable from the recent abdomen CT. Lungs/Pleura: New small to moderate size layering bilateral pleural effusions since 03/23/2017. Major airways are patent. There is linear  atelectasis in the left upper lobe, and compressive atelectasis in both lower lobes with minimal air bronchograms. Lung parenchyma elsewhere is within normal limits. Upper Abdomen: Left lobe approach percutaneous biliary drain is partially  visible. No upper abdominal free fluid or free air. The visible intrahepatic bile ducts now appear decompressed. Otherwise stable visualized upper abdominal viscera. Musculoskeletal: No acute osseous abnormality identified. IMPRESSION: 1. Small to moderate layering bilateral pleural effusions are new since 03/23/2017. Superimposed pulmonary atelectasis with no definite pneumonia. 2. Rounded but fluid density 3 cm area in the mediastinum anterior to the carina is favored to be pericardial fluid in a superior pericardial recess (normal variant). A follow-up Chest CT with IV contrast (such as in 3 months time) is recommended to document stability. 3. Partially visible upper abdomen transhepatic biliary drain with no adverse features. 4. Cardiomegaly. Electronically Signed   By: Genevie Ann M.D.   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 She is s/p PTC. Liver chemistries nearly normal now.  -EUS with biopsies by Dr. Ardis Hughs has been rescheduled for outpatient on 10/11.  *Cholangitis with Klebsiella bacteremia.  Was on Rocephin but switched to cefepime on 10/3 for HCAP.  *Afib / flutter with RVR, new this admission.  HR in 140's this AM.  On Amiodarone.  *RLE DVT, on Eliquis as outpatient.  *AKI, improving.   *Productive cough. CXR>>> bibasilar atelectasis vrs PNA.  Now on cefepime.  Temp of 101.2 this AM.    LOS: 8 days   ZEHR, JESSICA D.  03/30/2017, 9:14 AM  Pager number [phone]     Attending physician's note   I have taken an interval history, reviewed the chart and examined the patient. I agree with the Advanced Practitioner's note, impression and recommendations.  Biliary obstruction mgmt per IR.  Cholangitis, resolved.  HCAP and afib treatment continues. EUS with biopsies on Thursday 10/11 with Dr. Ardis Hughs. If Eliquis is restarted will need to stop it 2 days prior to EUS.   GI signing off. Please call if needed.     Lucio Edward, MD [facility] [phone] Mon-Fri 8a-5p [phone] after 5p, weekends, holidays

## 2017-04-06 NOTE — Op Note (Addendum)
[facility] Patient Name: Peggy Armstrong Procedure Date: 04/06/2017 MRN: [id number] Attending MD: [name] , MD Date of Birth: 04/01/1946 CSN: [id number] Age: 71 Admit Type: Outpatient Procedure:                Upper EUS Indications:              Common bile duct dilation (acquired) seen on ERCP Providers:                [name], MD, Elmer Ramp. Tilden Dome, RN,                            William Dalton, Technician Referring MD:             Wilfrid Lund, MD Medicines:                Monitored Anesthesia Care Complications:            No immediate complications. Estimated blood loss:                            None. Estimated Blood Loss:     Estimated blood loss: none. Procedure:                Pre-Anesthesia Assessment:                           - Prior to the procedure, a History and Physical                            was performed, and patient medications and                            allergies were reviewed. The patient's tolerance of                            previous anesthesia was also reviewed. The risks                            and benefits of the procedure and the sedation                            options and risks were discussed with the patient.                            All questions were answered, and informed consent                            was obtained. Prior Anticoagulants: The patient has                            taken Eliquis (apixaban), last dose was 3 days                            prior to procedure. ASA Grade Assessment: III - A  patient with severe systemic disease. After                            reviewing the risks and benefits, the patient was                            deemed in satisfactory condition to undergo the                            procedure.                           After obtaining informed consent, the endoscope was                            passed under direct vision.  Throughout the                            procedure, the patient's blood pressure, pulse, and                            oxygen saturations were monitored continuously. The                            was introduced through the mouth, and advanced to                            the second part of duodenum. The [id number]                            ([id number]) scope was introduced through the mouth,                            and advanced to the second part of duodenum. The                            [id number]) scope was introduced through                            the mouth, and advanced to the second part of                            duodenum. The upper EUS was accomplished without                            difficulty. The patient tolerated the procedure                            well. Scope In: Scope Out: Findings:      Endoscopic Finding: :      There was a large, round, soft mucosal mass at the region of the major       papilla. The recently placed, internalized biliary drain extends outward       from the mass (see pictures). The mass  is fairly mobile, there is       clearly extension of this neoplastic lesion along one of the side walls       of the duodenum. Mucosal biopsies were taken through the side viewing       duodenoscope.      EUS findings:      1. The ampullary mass decribed above correlated with a 3.5cm maximum       dimension heterogeneous (mixed isoechoic, hypoechoic) lesion that does       not show obvious signs of deep invasion into the pancreas, pancreatic       duct or bile duct. Fine needle aspiration for cytology was performed       from the central region of the mass. Color Doppler imaging was utilized       prior to needle puncture to confirm a lack of significant vascular       structures within the needle path. Two passes were made with the 25       gauge needle using a transduodenal approach. A cytotechnologist was       present to evaluate the  adequacy of the specimen. Final cytology results       are pending.      2. The internal/external biliary drain lays within a slightly dilated       main bile duct (32mm).      3. The pancreatic parenchyma was normal.      4. The main pancreatic duct was slightly dilated into the ampullary mass       above (4-63mm in head, neck).      5. No periportal adenopathy.      6. Limited views of the liver, spleen, portal and splenic vessels were       all normal. 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 I repeated mucosal biopsies of the mass which                            previously showed adenoma without HGD. I also                            performed EUS FNA from deeper within the mass,                            await final cytology results.                           - Capped internal/external biliary drain is in good                            position extending into the duodenum lumen from                            within the mass.  Moderate Sedation:      N/A- Per Anesthesia Care Recommendation:           - Discharge patient to home (ambulatory).                           - Await final cytology results. Procedure Code(s):        --- Professional ---                           [id number], Esophagogastroduodenoscopy, flexible,                            transoral; with transendoscopic ultrasound-guided                            intramural or transmural fine needle                            aspiration/biopsy(s), (includes endoscopic                            ultrasound examination limited to the esophagus,                            stomach or duodenum, and adjacent structures) Diagnosis Code(s):        --- Professional ---                           K31.89, Other diseases of stomach and duodenum                           K83.8, Other specified  diseases of biliary tract CPT copyright 2016 American [employer] Association. All rights reserved. The codes documented in this report are preliminary and upon coder review may  be revised to meet current compliance requirements. [name], MD 04/06/2017 12:04:04 PM This report has been signed electronically. Number of Addenda: 0

## 2017-04-07 ENCOUNTER — Encounter (HOSPITAL_COMMUNITY): Payer: Self-pay | Admitting: Gastroenterology

## 2017-04-10 ENCOUNTER — Telehealth: Payer: Self-pay | Admitting: Gastroenterology
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
## 2017-04-14 ENCOUNTER — Other Ambulatory Visit: Payer: Self-pay | Admitting: General Surgery

## 2017-04-17 ENCOUNTER — Telehealth: Payer: Self-pay

## 2017-04-17 NOTE — Telephone Encounter (Signed)
Thanks Julie

## 2017-04-17 NOTE — Telephone Encounter (Signed)
Hi all,  I just wanted to follow up for this patient and see if she had a referral in to see Dr. Lysle Rubens at [facility] for endoscopic attempt at removing large duodenal polyp.   Patty do you know if she has been referred yet? Dr. Ardis Hughs may have mentioned it a few weeks ago. If not, Almyra Free can you help initiate the consult? Thanks    I could not see where a referral has been sent to [facility] attn: Dr. Lysle Rubens. Faxed over information and referral form to [phone].

## 2017-04-18 ENCOUNTER — Telehealth: Payer: Self-pay

## 2017-04-18 NOTE — Telephone Encounter (Signed)
-----   Message from Yetta Flock, MD sent at 04/16/2017  9:32 PM EDT ----- Hi all, I just wanted to follow up for this patient and see if she had a referral in to see Dr. Lysle Rubens at [facility] for endoscopic attempt at removing large duodenal polyp.   Lanea Vankirk do you know if she has been referred yet? Dr. Ardis Hughs may have mentioned it a few weeks ago. If not, Almyra Free can you help initiate the consult? Thanks   ----- Message ----- From: [name], MD Sent: 04/14/2017   9:59 AM To: Stark Klein, MD, Yetta Flock, MD  Margie Ege working on referral to [facility].  It is a very big polyp, not even sure that the cowboys at [facility] will be willing but we'll see.  Thanks   ----- Message ----- From: Stark Klein, MD Sent: 04/14/2017   9:48 AM To: [name], MD  I would try to get the endoscopic removal since it has no dysplasia and she is currently not a good surgical candidate.  I am going to send her to PT in order to try to get her in better shape so she can be a surgical candidate in the future if needed.    tx FB

## 2017-04-19 ENCOUNTER — Telehealth: Payer: Self-pay | Admitting: Cardiovascular Disease

## 2017-04-19 NOTE — Telephone Encounter (Signed)
Closed encounter °

## 2017-04-20 ENCOUNTER — Ambulatory Visit: Payer: Medicare Other | Admitting: Physician Assistant

## 2017-04-24 ENCOUNTER — Encounter: Payer: Self-pay | Admitting: Physician Assistant

## 2017-04-24 ENCOUNTER — Ambulatory Visit (INDEPENDENT_AMBULATORY_CARE_PROVIDER_SITE_OTHER): Payer: Medicare Other | Admitting: Physician Assistant

## 2017-04-24 VITALS — BP 104/76 | HR 78 | Ht 61.0 in | Wt 236.0 lb

## 2017-04-24 DIAGNOSIS — I480 Paroxysmal atrial fibrillation: Secondary | ICD-10-CM

## 2017-04-24 DIAGNOSIS — I5032 Chronic diastolic (congestive) heart failure: Secondary | ICD-10-CM

## 2017-04-24 DIAGNOSIS — I10 Essential (primary) hypertension: Secondary | ICD-10-CM

## 2017-04-24 DIAGNOSIS — Z7901 Long term (current) use of anticoagulants: Secondary | ICD-10-CM | POA: Diagnosis not present

## 2017-04-24 MED ORDER — DILTIAZEM HCL ER COATED BEADS 240 MG PO CP24: 240.0000 mg | ORAL_CAPSULE | Freq: Every day | ORAL | 5 refills | Status: DC

## 2017-04-24 MED ORDER — AMIODARONE HCL 200 MG PO TABS: ORAL_TABLET | ORAL | 5 refills | Status: DC

## 2017-04-24 NOTE — Patient Instructions (Signed)
Medication Instructions:  CHANGE DILTIAZEM 240MG  DAILY If you need a refill on your cardiac medications before your next appointment, please call your pharmacy.  Follow-Up: Your physician wants you to follow-up in: [place].   Thank you for choosing [facility] at [facility]!!

## 2017-04-24 NOTE — Telephone Encounter (Signed)
Per Almyra Free the pt has been referred to [facility]

## 2017-04-24 NOTE — Progress Notes (Signed)
Cardiology Office Note   Date:  04/24/2017   ID:  Peggy Armstrong, DOB [birth year], MRN [id number]  PCP:  Thurman Coyer, MD  Cardiologist:  Dr Sallyanne Kuster, 03/29/2017 in-hospital   [place], [address]   Chief Complaint  Patient presents with  . Follow-up    PT HAS NO COMPLAINTS    History of Present Illness: Peggy Armstrong is a 71 y.o. female with a history of DVT on Eliquis, RA, HTN  Admitted 09/26-10/09/2016 w/ SIRS and Sepsis due to ascending cholangitis, klebsiella bacteremia, duodenal mass. Cards saw for rapid atrial fib/flutter not responsive to metoprolol or Cardizem>>amio 400 mg bid thru 10/10, then 400 mg qd x 1 week, then 200 mg qd  Peggy Armstrong presents for cardiology follow up  Her breathing has gradually gotten better. She had a great deal of congestion at first but that has gotten better. She is starting to get out and walk a little with her granddaughter.   She had been told to drink Gatorade, that is when the LE swelling started. She is not drinking Gatorade any more. Lower extremity edema has improved. She has chronic dyspnea on exertion that has improved some from discharge. She denies orthopnea or PND. She has scales at home and can do daily weights.  She has not had chest pain. She has not had palpitations.   She has been a little lightheaded or dizzy at times, orthostatic in nature. She has a blood pressure cuff at home and can check her blood pressure. She is no longer taking the Lasix, but is still taking the potassium, her PCP told her her potassium level was still low.  No bleeding issues on the Eliquis.    Past Medical History:  Diagnosis Date  . Clotting disorder ([place])    right DVT  . Gout   . Gout   . Hypertension   . RA (rheumatoid arthritis) ([place])     Past Surgical History:  Procedure Laterality Date  . ABDOMINAL HYSTERECTOMY    . CHOLECYSTECTOMY    . ESOPHAGOGASTRODUODENOSCOPY (EGD) WITH PROPOFOL N/A 03/24/2017   Procedure: ESOPHAGOGASTRODUODENOSCOPY (EGD) WITH PROPOFOL;  Surgeon: Doran Stabler, MD;  Location: [facility];  Service: Gastroenterology;  Laterality: N/A;  . EUS N/A 04/06/2017   Procedure: UPPER ENDOSCOPIC ULTRASOUND (EUS) LINEAR;  Surgeon: [name], MD;  Location: [facility];  Service: Endoscopy;  Laterality: N/A;  to evaluate duodenal mass  . HERNIA REPAIR    . IR BILIARY DRAIN PLACEMENT WITH CHOLANGIOGRAM  03/25/2017  . IR CONVERT BILIARY DRAIN TO INT EXT BILIARY DRAIN  04/03/2017    Current Outpatient Prescriptions  Medication Sig Dispense Refill  . acetaminophen (TYLENOL) 650 MG CR tablet Take 650 mg by mouth every 8 (eight) hours as needed for pain.    Marland Kitchen albuterol (PROVENTIL HFA;VENTOLIN HFA) 108 (90 Base) MCG/ACT inhaler Inhale 2 puffs into the lungs every 6 (six) hours as needed for wheezing or shortness of breath.     . allopurinol (ZYLOPRIM) 300 MG tablet Take 450 mg by mouth daily.     Marland Kitchen ALPRAZolam (XANAX) 0.25 MG tablet Take 0.25 mg by mouth at bedtime.   2  . amiodarone (PACERONE) 400 MG tablet - continue oral amiodarone 400 mg twice a day through 04/05/2017 - At that time plan to reduce dose to 400 mg by mouth daily for 1 week, continue 200 mg daily until outpatient follow-up 20 tablet 1  . apixaban (ELIQUIS) 5 MG TABS tablet  Take 1 tablet (5 mg total) by mouth 2 (two) times daily. Stop taking this medication on October 9th, 2018. 60 tablet 0  . chlorthalidone (HYGROTON) 25 MG tablet Take 25 mg by mouth daily.    . cholecalciferol (VITAMIN D) 1000 units tablet Take 1,000 Units by mouth daily.    Marland Kitchen diltiazem (CARDIZEM CD) 300 MG 24 hr capsule Take 300 mg by mouth daily.    . furosemide (LASIX) 20 MG tablet Take 20 mg by mouth.    . levalbuterol (XOPENEX) 0.63 MG/3ML nebulizer solution Take 3 mLs (0.63 mg total) by nebulization every 6 (six) hours as needed for wheezing or shortness of breath. 3 mL 12  . losartan (COZAAR) 25 MG tablet Take 25 mg by mouth daily.      . ondansetron (ZOFRAN) 4 MG tablet Take 1 tablet (4 mg total) by mouth every 6 (six) hours as needed for nausea. 20 tablet 0  . oxyCODONE (OXY IR/ROXICODONE) 5 MG immediate release tablet Take 1 tablet (5 mg total) by mouth every 4 (four) hours as needed for moderate pain. 30 tablet 0  . pantoprazole (PROTONIX) 40 MG tablet Take 1 tablet (40 mg total) by mouth daily. 30 tablet 1  . potassium chloride (K-DUR) 10 MEQ tablet Take 10 mEq by mouth daily. TAKE WITH LASIX    . senna-docusate (SENOKOT-S) 8.6-50 MG tablet Take 1 tablet by mouth at bedtime as needed for mild constipation.    . vitamin B-12 (CYANOCOBALAMIN) 1000 MCG tablet Take 1,000 mcg by mouth daily.     No current facility-administered medications for this visit.     Allergies:   Codeine    Social History:  The patient  reports that she has never smoked. She has never used smokeless tobacco. She reports that she does not drink alcohol or use drugs.   Family History:  The patient's family history includes Cancer in her father.    ROS:  Please see the history of present illness. All other systems are reviewed and negative.    PHYSICAL EXAM: VS:  BP 104/76   Pulse 78   Ht 5\' 1"  (1.549 m)   Wt 236 lb (107 kg)   BMI 44.59 kg/m  , BMI Body mass index is 44.59 kg/m. GEN: Well nourished, well developed, female in no acute distress  HEENT: normal for age  Neck: no JVD Seen, no carotid bruit, no masses Cardiac: RRR; no murmur, no rubs, or gallops Respiratory:  Decreased breath sounds bases bilaterally, normal work of breathing GI: soft, nontender, nondistended, + BS MS: no deformity or atrophy; no edema; distal pulses are 2+ in all 4 extremities   Skin: warm and dry, no rash Neuro:  Strength and sensation are intact Psych: euthymic mood, full affect   EKG:  EKG is not ordered today. ECG from 04/06/2017 is reviewed and is sinus rhythm  ECHO: 03/28/2017 - Left ventricle: The cavity size was normal. Wall thickness was    increased in a pattern of mild LVH. Systolic function was normal.   The estimated ejection fraction was in the range of 60% to 65%.   Wall motion was normal; there were no regional wall motion   abnormalities. Features are consistent with a pseudonormal left   ventricular filling pattern, with concomitant abnormal relaxation   and increased filling pressure (grade 2 diastolic dysfunction). - Aortic valve: Valve area (VTI): 1.61 cm^2. Valve area (Vmax):   1.64 cm^2. Valve area (Vmean): 1.41 cm^2. - Right ventricle: The cavity size  was moderately dilated. Systolic   function was moderately reduced. - Right atrium: The atrium was mildly dilated.   Recent Labs: 03/26/2017: TSH 4.457 03/29/2017: Magnesium 1.8 04/03/2017: ALT 42; BUN 20; Creatinine, Ser 1.39; Hemoglobin 10.3; Platelets 304; Potassium 2.8; Sodium 135    Lipid Panel No results found for: CHOL, TRIG, HDL, CHOLHDL, VLDL, LDLCALC, LDLDIRECT   Wt Readings from Last 3 Encounters:  04/24/17 236 lb (107 kg)  04/06/17 241 lb (109.3 kg)  04/03/17 241 lb (109.3 kg)     Other studies Reviewed: Additional studies/ records that were reviewed today include: Hospital records and testing.  ASSESSMENT AND PLAN:  1.  PAF: She is not currently having any palpitations. She is not having any presyncope. She agrees to check her blood pressure and heart rate occasionally on her she. She is to let us know if her heart rate is elevated or if she is having any palpitations or presyncope.  Since she has an upcoming procedure at [facility], I will hold off on the event monitor for now. She seems a bit overwhelmed by all of her medical issues. Continue amiodarone at 200 mg daily.  2. Chronic anticoagulation: She is not having any bleeding issues on the full strength Eliquis.  3. Hypertension: Her blood pressures on the low side of normal. She's had some orthostatic dizziness. I'll decrease the Cardizem from 300 mg to 240 mg daily, follow her blood  pressure.  4. Chronic diastolic CHF: She had grade 2 diastolic dysfunction on her echocardiogram. She is not taking the Lasix daily anymore. Her volume status is good by exam. Continue the chlorthalidone. Start daily weights.   Current medicines are reviewed at length with the patient today.  The patient does not have concerns regarding medicines.  The following changes have been made:  Decrease Cardizem  Labs/ tests ordered today include:  No orders of the defined types were placed in this encounter.    Disposition:   FU with Dr Sallyanne Kuster  Signed, Lenoard Aden  04/24/2017 9:27 AM    [place] Phone: [phone]; Fax: [phone]  This note was written with the assistance of speech recognition software. Please excuse any transcriptional errors.

## 2017-05-05 ENCOUNTER — Encounter: Payer: Self-pay | Admitting: Gastroenterology

## 2017-05-05 DIAGNOSIS — Z9889 Other specified postprocedural states: Secondary | ICD-10-CM

## 2017-05-05 HISTORY — DX: Other specified postprocedural states: Z98.890

## 2017-05-10 ENCOUNTER — Telehealth: Payer: Self-pay | Admitting: Cardiovascular Disease

## 2017-05-10 MED ORDER — AMIODARONE HCL 200 MG PO TABS: ORAL_TABLET | ORAL | 2 refills | Status: DC

## 2017-05-10 NOTE — Telephone Encounter (Signed)
Returned the call to the patient. Message was left, per dpr, that the prescription has been filled per the patient's request.

## 2017-05-10 NOTE — Telephone Encounter (Signed)
New message     *STAT* If patient is at the pharmacy, call can be transferred to refill team.   1. Which medications need to be refilled? (please list name of each medication and dose if known) amiodarone (PACERONE) 200 MG tablet  2. Which pharmacy/location (including street and city if local pharmacy) is medication to be sent to? [facility] - [phone] [address]   3. Do they need a 30 day or 90 day supply?[place]

## 2017-05-11 ENCOUNTER — Telehealth: Payer: Self-pay

## 2017-05-11 ENCOUNTER — Telehealth: Payer: Self-pay | Admitting: Gastroenterology

## 2017-05-11 NOTE — Telephone Encounter (Signed)
Received records and they are in your office. These have not been scanned yet.

## 2017-05-11 NOTE — Telephone Encounter (Signed)
Called regarding appts scheduled in December. She recently had surgery at 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 She is not sure that she will be able to make appts. She will call when she knows more.

## 2017-05-11 NOTE — Telephone Encounter (Signed)
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  I'm not sure about the margins or if she was left with any stents.  He was going to fax the information over but it's probably a good idea that we reach out to their office for all that to make sure it gets here for review and then referrals as appropriate. Thanks   Patty, See above.  Thanks

## 2017-05-11 NOTE — Telephone Encounter (Signed)
Thanks for letting me know Linna Hoff. We will follow up on it.   Almyra Free can you reach out to Dr. Glenna Durand office at [facility] to obtain the results from his endoscopy and pathology results? Thanks much

## 2017-05-12 ENCOUNTER — Other Ambulatory Visit: Payer: Self-pay

## 2017-05-12 DIAGNOSIS — D490 Neoplasm of unspecified behavior of digestive system: Secondary | ICD-10-CM

## 2017-05-12 NOTE — Telephone Encounter (Signed)
Called and left below message. Ask to call nurse back with questions.

## 2017-05-12 NOTE — Telephone Encounter (Signed)
Based on that findings she will likely need chemo. I would probably refer her to either Dr. Learta Codding or Dr. Burr Medico if she wants to be treated here. Let me know what she thinks

## 2017-05-12 NOTE — Telephone Encounter (Signed)
Discussed with Dr. Ardis Hughs - patient is going to be presented at GI cancer conference, she will need a follow up appointment with surgery and also consultation Oncology, as well as likely repeat endoscopy / ERCP. Appointments to be coordinated. He is going to take over her GI care given her need for follow up ERCPs, etc.

## 2017-05-17 ENCOUNTER — Other Ambulatory Visit: Payer: Self-pay

## 2017-05-17 ENCOUNTER — Telehealth: Payer: Self-pay | Admitting: Gastroenterology

## 2017-05-17 DIAGNOSIS — D490 Neoplasm of unspecified behavior of digestive system: Secondary | ICD-10-CM

## 2017-05-17 NOTE — Telephone Encounter (Signed)
Gi tumor board meeting today:    Peggy Armstrong: She needs follow up apt with Dr. Barry Dienes (consider whipple) She needs sooner office visit with me (early to mid December, double book in afternoon if needed and add to wait list).  Planning on repeat ERCP in 6-7 weeks from now, remove stent (?replace with new one?), biopsy periampullary to attempt to confirm if still cancer present in her.  Thank s

## 2017-05-17 NOTE — Telephone Encounter (Signed)
Patient's daughter Jenny Reichmann calling back states she has other questions regarding this and requesting a call back. Best call back # is [phone].

## 2017-05-17 NOTE — Telephone Encounter (Signed)
Dr Barry Dienes 11/27 @ 11:30 am arrive at 11 am  Follow up with Ardis Hughs on 06/12/17 @ 2 pm  Needs CBC, CMET 11/29  ERCP 12/20 730 am (is pt on Eliquis)  The pt's daughter has been advised of the above information.  She was also given instructions for ERCP.  The pt is on Eliquis I will send letter to PCP regarding holding Eliquis.

## 2017-05-17 NOTE — Progress Notes (Signed)
  Oncology Nurse Navigator Documentation  Navigator Location: [facility] (05/17/17 1200)   )Navigator Encounter Type: Telephone (05/17/17 1200) Telephone: Incoming Call (05/17/17 1200)                           Interventions: Psycho-social support (05/17/17 1200)    Patient's daughter called to ask me if patient could be set up to see a medical oncologist. I explained to patient that Dr. Alvy Bimler is an oncologist. Patient is scheduled to see Dr. Alvy Bimler on 06/09/17.     Acuity: Level 1 (05/17/17 1200) Acuity Level 1: Initial guidance, education and coordination as needed (05/17/17 1200)       Time Spent with Patient: 15 (05/17/17 1200)

## 2017-05-17 NOTE — Telephone Encounter (Signed)
I spoke with the daughter Jenny Reichmann and she will advise her mother of the appts and will call back if she needs to change anything.

## 2017-05-17 NOTE — Telephone Encounter (Signed)
Thanks for your help with her case Linna Hoff

## 2017-05-24 NOTE — Telephone Encounter (Signed)
The pt's daughter was advised that the pt can hold Eliquis 2 days prior to procedure

## 2017-05-25 ENCOUNTER — Other Ambulatory Visit (INDEPENDENT_AMBULATORY_CARE_PROVIDER_SITE_OTHER): Payer: Medicare Other

## 2017-05-25 DIAGNOSIS — D490 Neoplasm of unspecified behavior of digestive system: Secondary | ICD-10-CM | POA: Diagnosis not present

## 2017-05-25 LAB — CBC WITH DIFFERENTIAL/PLATELET
BASOS ABS: 0.1 10*3/uL (ref 0.0–0.1)
Basophils Relative: 1.1 % (ref 0.0–3.0)
EOS PCT: 2.7 % (ref 0.0–5.0)
Eosinophils Absolute: 0.2 10*3/uL (ref 0.0–0.7)
HEMATOCRIT: 34.5 % — AB (ref 36.0–46.0)
HEMOGLOBIN: 10.8 g/dL — AB (ref 12.0–15.0)
LYMPHS PCT: 13 % (ref 12.0–46.0)
Lymphs Abs: 1 10*3/uL (ref 0.7–4.0)
MCHC: 31.4 g/dL (ref 30.0–36.0)
MCV: 79.6 fl (ref 78.0–100.0)
MONOS PCT: 11.1 % (ref 3.0–12.0)
Monocytes Absolute: 0.9 10*3/uL (ref 0.1–1.0)
NEUTROS PCT: 72.1 % (ref 43.0–77.0)
Neutro Abs: 5.8 10*3/uL (ref 1.4–7.7)
Platelets: 307 10*3/uL (ref 150.0–400.0)
RBC: 4.33 Mil/uL (ref 3.87–5.11)
RDW: 19.4 % — ABNORMAL HIGH (ref 11.5–15.5)
WBC: 8.1 10*3/uL (ref 4.0–10.5)

## 2017-05-25 LAB — COMPREHENSIVE METABOLIC PANEL
ALBUMIN: 3.7 g/dL (ref 3.5–5.2)
ALK PHOS: 110 U/L (ref 39–117)
ALT: 9 U/L (ref 0–35)
AST: 10 U/L (ref 0–37)
BILIRUBIN TOTAL: 0.4 mg/dL (ref 0.2–1.2)
BUN: 31 mg/dL — ABNORMAL HIGH (ref 6–23)
CALCIUM: 9.1 mg/dL (ref 8.4–10.5)
CO2: 31 mEq/L (ref 19–32)
Chloride: 98 mEq/L (ref 96–112)
Creatinine, Ser: 1.93 mg/dL — ABNORMAL HIGH (ref 0.40–1.20)
GFR: 27.14 mL/min — AB (ref >60.00)
GLUCOSE: 96 mg/dL (ref 70–99)
POTASSIUM: 3.3 meq/L — AB (ref 3.5–5.1)
Sodium: 137 mEq/L (ref 135–145)
TOTAL PROTEIN: 7.3 g/dL (ref 6.0–8.3)

## 2017-05-30 ENCOUNTER — Other Ambulatory Visit: Payer: Self-pay

## 2017-05-30 DIAGNOSIS — D500 Iron deficiency anemia secondary to blood loss (chronic): Secondary | ICD-10-CM

## 2017-05-31 ENCOUNTER — Other Ambulatory Visit: Payer: Self-pay

## 2017-05-31 DIAGNOSIS — D500 Iron deficiency anemia secondary to blood loss (chronic): Secondary | ICD-10-CM

## 2017-06-02 ENCOUNTER — Other Ambulatory Visit: Payer: Medicare Other

## 2017-06-02 ENCOUNTER — Other Ambulatory Visit (INDEPENDENT_AMBULATORY_CARE_PROVIDER_SITE_OTHER): Payer: Medicare Other

## 2017-06-02 DIAGNOSIS — D500 Iron deficiency anemia secondary to blood loss (chronic): Secondary | ICD-10-CM

## 2017-06-02 LAB — CBC WITH DIFFERENTIAL/PLATELET
BASOS PCT: 1.5 % (ref 0.0–3.0)
Basophils Absolute: 0.1 10*3/uL (ref 0.0–0.1)
EOS ABS: 0.2 10*3/uL (ref 0.0–0.7)
Eosinophils Relative: 2.7 % (ref 0.0–5.0)
HEMATOCRIT: 35.4 % — AB (ref 36.0–46.0)
Hemoglobin: 11 g/dL — ABNORMAL LOW (ref 12.0–15.0)
LYMPHS ABS: 0.9 10*3/uL (ref 0.7–4.0)
LYMPHS PCT: 12.8 % (ref 12.0–46.0)
MCHC: 31.2 g/dL (ref 30.0–36.0)
MCV: 80.2 fl (ref 78.0–100.0)
Monocytes Absolute: 0.9 10*3/uL (ref 0.1–1.0)
Monocytes Relative: 13 % — ABNORMAL HIGH (ref 3.0–12.0)
NEUTROS ABS: 4.9 10*3/uL (ref 1.4–7.7)
NEUTROS PCT: 70 % (ref 43.0–77.0)
PLATELETS: 323 10*3/uL (ref 150.0–400.0)
RBC: 4.41 Mil/uL (ref 3.87–5.11)
RDW: 18.9 % — AB (ref 11.5–15.5)
WBC: 7 10*3/uL (ref 4.0–10.5)

## 2017-06-02 LAB — BASIC METABOLIC PANEL
BUN: 23 mg/dL (ref 6–23)
CO2: 32 mEq/L (ref 19–32)
CREATININE: 1.36 mg/dL — AB (ref 0.40–1.20)
Calcium: 9.2 mg/dL (ref 8.4–10.5)
Chloride: 99 mEq/L (ref 96–112)
GFR: 40.64 mL/min — AB (ref >60.00)
Glucose, Bld: 89 mg/dL (ref 70–99)
Potassium: 3.1 mEq/L — ABNORMAL LOW (ref 3.5–5.1)
Sodium: 139 mEq/L (ref 135–145)

## 2017-06-05 ENCOUNTER — Encounter (HOSPITAL_COMMUNITY): Payer: Self-pay | Admitting: Emergency Medicine

## 2017-06-05 ENCOUNTER — Other Ambulatory Visit: Payer: Self-pay

## 2017-06-09 ENCOUNTER — Ambulatory Visit: Payer: Medicare Other

## 2017-06-09 ENCOUNTER — Ambulatory Visit (HOSPITAL_BASED_OUTPATIENT_CLINIC_OR_DEPARTMENT_OTHER): Payer: Medicare Other | Admitting: Hematology and Oncology

## 2017-06-09 ENCOUNTER — Encounter: Payer: Self-pay | Admitting: Hematology and Oncology

## 2017-06-09 ENCOUNTER — Telehealth: Payer: Self-pay | Admitting: Hematology and Oncology

## 2017-06-09 DIAGNOSIS — D509 Iron deficiency anemia, unspecified: Secondary | ICD-10-CM | POA: Diagnosis not present

## 2017-06-09 DIAGNOSIS — C241 Malignant neoplasm of ampulla of Vater: Secondary | ICD-10-CM

## 2017-06-09 DIAGNOSIS — I82501 Chronic embolism and thrombosis of unspecified deep veins of right lower extremity: Secondary | ICD-10-CM

## 2017-06-09 DIAGNOSIS — N183 Chronic kidney disease, stage 3 unspecified: Secondary | ICD-10-CM

## 2017-06-09 DIAGNOSIS — D500 Iron deficiency anemia secondary to blood loss (chronic): Secondary | ICD-10-CM

## 2017-06-09 HISTORY — DX: Chronic kidney disease, stage 3 unspecified: N18.30

## 2017-06-09 HISTORY — DX: Malignant neoplasm of ampulla of Vater: C24.1

## 2017-06-09 NOTE — Telephone Encounter (Signed)
Gave avs and calendar for January 2019 °

## 2017-06-09 NOTE — Assessment & Plan Note (Signed)
She has history of blood clot in the past and is currently taking anticoagulation therapy She has been instructed to hold treatment in view of anticipated invasive procedure in the near future

## 2017-06-09 NOTE — Assessment & Plan Note (Signed)
She will be undergoing further evaluation next week Based on recent imaging study, she have early stage disease The patient has been evaluated by general surgery and is deemed not a surgical candidate We discussed briefly about the role of palliative chemotherapy and I will bring her back after the new year to review test results and plan of care

## 2017-06-09 NOTE — Assessment & Plan Note (Addendum)
This is stable.  She will continue medical management

## 2017-06-09 NOTE — Progress Notes (Signed)
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 Mild intra hepatic and moderate extrahepatic biliary dilatation, post cholecystectomy. Recommend correlation with laboratory values, follow-up MR or ERCP as indicated 2. Sigmoid colon diverticular disease without acute inflammation.       03/22/2017 - 03/30/2017 Hospital Admission    She was admitted for management of biliary obstruction      03/23/2017 Imaging    1. Mild to moderately motion degraded exam. 2. Biliary duct dilatation after cholecystectomy. No choledocholithiasis or well-defined obstructive mass. Given the elevated bilirubin, consider further evaluation with ERCP to exclude ampullary stricture or otherwise occult ampullary lesion. 3. Hiatal hernia. 4. Iron deposition in the liver      03/24/2017 Procedure    She underwent EGD which revealed ulcerated mass in 2nd portion of duodenum      03/24/2017 Pathology Results    Duodenum, Biopsy, duodenum mass - ADENOMA. - NO HIGH GRADE DYSPLASIA OR MALIGNANCY      03/24/2017 Procedure    Status post percutaneous transhepatic cholangiogram with placement of a left approach externalized biliary drain.      03/29/2017 Imaging    CT chest 1. Small to moderate layering bilateral pleural effusions are new since 03/23/2017. Superimposed pulmonary atelectasis with no definite pneumonia. 2. Rounded but fluid density 3 cm area in the mediastinum anterior to the carina is favored to be pericardial fluid in a superior pericardial recess (normal variant). A follow-up Chest CT with IV contrast (such as in 3 months time) is recommended to document stability. 3. Partially visible upper abdomen transhepatic biliary drain with no adverse features. 4.  Cardiomegaly.      04/03/2017 Procedure    Status post routine exchange of left hepatic approach external biliary drain for a new, modified, 10 [place] internal/ external drain.       04/06/2017 Pathology Results    Duodenum, Biopsy, peri ampullary mass - DUODENAL TUBULOVILLOUS ADENOMA. - NO HIGH GRADE DYSPLASIA OR INVASIVE CARCINOMA      04/06/2017 Procedure    EUS - 3.5cm soft, round ampullary neoplasm without evident deep invasion into the pancreas, duodenal wall. I repeated mucosal biopsies of the mass which previously showed adenoma without HGD. I also performed EUS FNA from deeper within the mass, await final cytology results. - Capped internal/external biliary drain is in good position extending into the duodenum lumen from within the mass.      04/06/2017 Pathology Results    FINE NEEDLE ASPIRATION, ENDOSCOPIC PERI AMPULLARY MASS ( SPECIMEN 1 OF 1 COLLECTED 04/06/2017) ATYPICAL GLANDULAR CELLS, SEE COMMENT. Preliminary Diagnosis Intraoperative Diagnosis: No overt malignant features (JM)      05/02/2017 Pathology Results    Biopsy showed high grade dysplasia/intramucosal adenocarcinoma with areas suspicious for submucosal invasion      05/02/2017 Procedure    She underwent sphincterotomy, biopsy and biliary stent placement      This patient is originally seen because of history of DVT, possible lupus anticoagulant, on chronic anticoagulation therapy. 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 In 2015, she tested positive for rheumatoid factor and anti-CCP antibodies and saw a rheumatologist but was  never started on treatment for rheumatoid arthritis She has been complaining of intermittent right lower extremity pain around the knee and the back of the knee since January 2017. She was originally diagnosed with an acute gout attack but  subsequently saw a different physician for follow-up and venous Doppler was ordered on 08/18/2015 which showed findings consistent with acute deep vein thrombosis involving the right lower extremity. Acute thrombus identified in right femoral vein, popliteal vein, posterior tibial veins and peroneal veins. She denies immobility, history of trauma, long distance travel, recent surgery, smoking or prolonged immobilization. She was anticoagulated with Eliquis. The patient denies any recent signs or symptoms of bleeding such as spontaneous epistaxis, hematuria or hematochezia.  She denies further lower extremity swelling, warmth, tenderness & erythema.  She denies recent chest pain on exertion, shortness of breath on minimal exertion, pre-syncopal episodes, hemoptysis, or palpitation. She had no prior history or diagnosis of cancer. Her age appropriate screening programs are not up-to-date as she refuses mammogram and colonoscopy. She had prior surgeries before and never had perioperative thromboembolic events. The patient had been exposed to birth control pills and never had thrombotic events. The patient had been pregnant before and denies history of peripartum thromboembolic event or history of recurrent miscarriages. There is no family history of blood clots or miscarriages. The cause of DVT is likely due to possible lupus anticoagulant and prolonged immobilization.  She was ultimately placed on long-term treatment with Eliquis, low-dose The patient also receive intermittent IV iron for iron deficiency anemia  INTERVAL HISTORY: I have reviewed numerous outside records Please see below for problem oriented charting. She was recently found to have recurrent iron deficiency anemia again and has received IV iron in September Further workup revealed duodenal mass when she presented with obstructive jaundice She had numerous investigation as outlined above and the most recent biopsy from [facility]  showed evidence of malignancy At present time, she feels well Denies abdominal pain, nausea or changes in bowel habits The patient denies any recent signs or symptoms of bleeding such as spontaneous epistaxis, hematuria or hematochezia. Her appetite is stable  REVIEW OF SYSTEMS:   Constitutional: Denies fevers, chills or abnormal weight loss Eyes: Denies blurriness of vision Ears, nose, mouth, throat, and face: Denies mucositis or sore throat Respiratory: Denies cough, dyspnea or wheezes Cardiovascular: Denies palpitation, chest discomfort or lower extremity swelling Gastrointestinal:  Denies nausea, heartburn or change in bowel habits Skin: Denies abnormal skin rashes Lymphatics: Denies new lymphadenopathy or easy bruising Neurological:Denies numbness, tingling or new weaknesses Behavioral/Psych: Mood is stable, no new changes  All other systems were reviewed with the patient and are negative.  I have reviewed the past medical history, past surgical history, social history and family history with the patient and they are unchanged from previous note.  ALLERGIES:  is allergic to codeine.  MEDICATIONS:  Current Outpatient Medications  Medication Sig Dispense Refill  . acetaminophen (TYLENOL) 650 MG CR tablet Take 650 mg by mouth every 8 (eight) hours as needed for pain.    Marland Kitchen albuterol (PROVENTIL HFA;VENTOLIN HFA) 108 (90 Base) MCG/ACT inhaler Inhale 2 puffs into the lungs every 6 (six) hours as needed for wheezing or shortness of breath.     . allopurinol (ZYLOPRIM) 300 MG tablet Take 450 mg by mouth daily.     Marland Kitchen ALPRAZolam (XANAX) 0.25 MG tablet Take 0.25 mg by mouth at bedtime.   2  . amiodarone (PACERONE) 200 MG tablet One tablet daily 30 tablet  2  . apixaban (ELIQUIS) 5 MG TABS tablet Take 1 tablet (5 mg total) by mouth 2 (two) times daily. Stop taking this medication on October 9th, 2018. 60 tablet 0  . chlorthalidone (HYGROTON) 25 MG tablet Take 25 mg by mouth daily.    .  cholecalciferol (VITAMIN D) 1000 units tablet Take 1,000 Units by mouth daily.    Marland Kitchen diltiazem (CARDIZEM CD) 240 MG 24 hr capsule Take 1 capsule (240 mg total) by mouth daily. 30 capsule 5  . losartan (COZAAR) 25 MG tablet Take 25 mg by mouth daily.    . pantoprazole (PROTONIX) 40 MG tablet Take 1 tablet (40 mg total) by mouth daily. 30 tablet 1  . vitamin B-12 (CYANOCOBALAMIN) 1000 MCG tablet Take 1,000 mcg by mouth daily.     No current facility-administered medications for this visit.     PHYSICAL EXAMINATION: ECOG PERFORMANCE STATUS: 2 - Symptomatic, <50% confined to bed  Vitals:   06/09/17 1053  BP: 134/81  Pulse: 83  Resp: 18  Temp: 97.6 F (36.4 C)  SpO2: 100%   Filed Weights   06/09/17 1053  Weight: 244 lb 12.8 oz (111 kg)    GENERAL:alert, no distress and comfortable.  She appears frail.  She is moderately obese SKIN: skin color, texture, turgor are normal, no rashes or significant lesions EYES: normal, Conjunctiva are pink and non-injected, sclera clear OROPHARYNX:no exudate, no erythema and lips, buccal mucosa, and tongue normal  NECK: supple, thyroid normal size, non-tender, without nodularity LYMPH:  no palpable lymphadenopathy in the cervical, axillary or inguinal LUNGS: clear to auscultation and percussion with normal breathing effort HEART: regular rate & rhythm and no murmurs and no lower extremity edema ABDOMEN:abdomen soft, non-tender and normal bowel sounds Musculoskeletal:no cyanosis of digits and no clubbing  NEURO: alert & oriented x 3 with fluent speech, no focal motor/sensory deficits  LABORATORY DATA:  I have reviewed the data as listed    Component Value Date/Time   NA 139 06/02/2017 1247   NA 142 08/11/2016 0936   K 3.1 (L) 06/02/2017 1247   K 3.4 (L) 08/11/2016 0936   CL 99 06/02/2017 1247   CO2 32 06/02/2017 1247   CO2 25 08/11/2016 0936   GLUCOSE 89 06/02/2017 1247   GLUCOSE 119 08/11/2016 0936   BUN 23 06/02/2017 1247   BUN 23.3  08/11/2016 0936   CREATININE 1.36 (H) 06/02/2017 1247   CREATININE 1.3 (H) 08/11/2016 0936   CALCIUM 9.2 06/02/2017 1247   CALCIUM 9.6 08/11/2016 0936   PROT 7.3 05/25/2017 1549   PROT 6.8 08/11/2016 0936   ALBUMIN 3.7 05/25/2017 1549   ALBUMIN 3.3 (L) 08/11/2016 0936   AST 10 05/25/2017 1549   AST 16 08/11/2016 0936   ALT 9 05/25/2017 1549   ALT 18 08/11/2016 0936   ALKPHOS 110 05/25/2017 1549   ALKPHOS 121 08/11/2016 0936   BILITOT 0.4 05/25/2017 1549   BILITOT 0.36 08/11/2016 0936   GFRNONAA 37 (L) 04/03/2017 1030   GFRAA 43 (L) 04/03/2017 1030    No results found for: SPEP, UPEP  Lab Results  Component Value Date   WBC 7.0 06/02/2017   NEUTROABS 4.9 06/02/2017   HGB 11.0 (L) 06/02/2017   HCT 35.4 (L) 06/02/2017   MCV 80.2 06/02/2017   PLT 323.0 06/02/2017      Chemistry      Component Value Date/Time   NA 139 06/02/2017 1247   NA 142 08/11/2016 0936   K 3.1 (L) 06/02/2017  1247   K 3.4 (L) 08/11/2016 0936   CL 99 06/02/2017 1247   CO2 32 06/02/2017 1247   CO2 25 08/11/2016 0936   BUN 23 06/02/2017 1247   BUN 23.3 08/11/2016 0936   CREATININE 1.36 (H) 06/02/2017 1247   CREATININE 1.3 (H) 08/11/2016 0936      Component Value Date/Time   CALCIUM 9.2 06/02/2017 1247   CALCIUM 9.6 08/11/2016 0936   ALKPHOS 110 05/25/2017 1549   ALKPHOS 121 08/11/2016 0936   AST 10 05/25/2017 1549   AST 16 08/11/2016 0936   ALT 9 05/25/2017 1549   ALT 18 08/11/2016 0936   BILITOT 0.4 05/25/2017 1549   BILITOT 0.36 08/11/2016 0936       RADIOGRAPHIC STUDIES: I have reviewed her recent imaging studyI have reviewed her recent CT imaginghave reviewed her recent imaging I have personally reviewed the radiological images as listed and agreed with the findings in the report.   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[place]) She will be undergoing further evaluation next week Based on recent imaging study, she have early stage disease The patient has been evaluated by  general surgery and is deemed not a surgical candidate We discussed briefly about the role of palliative chemotherapy and I will bring her back after the new year to review test results and plan of care  Iron deficiency anemia Since recent intravenous iron, anemia has almost completely resolved I will cancel her intravenous iron treatment today in view of possible MRI imaging in the future She is not symptomatic from mild anemia  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  She will continue medical management   No orders of the defined types were placed in this encounter.  All questions were answered. The patient knows to call the clinic with any problems, questions or concerns. No barriers to learning was detected. I spent 25 minutes counseling the patient face to face. The total time spent in the appointment was 40 minutes and more than 50% was on counseling and review of test results     Heath Lark, MD 06/09/2017 5:55 PM

## 2017-06-09 NOTE — Assessment & Plan Note (Signed)
Since recent intravenous iron, anemia has almost completely resolved I will cancel her intravenous iron treatment today in view of possible MRI imaging in the future She is not symptomatic from mild anemia

## 2017-06-12 ENCOUNTER — Encounter: Payer: Self-pay | Admitting: Gastroenterology

## 2017-06-12 ENCOUNTER — Ambulatory Visit (INDEPENDENT_AMBULATORY_CARE_PROVIDER_SITE_OTHER): Payer: Medicare Other | Admitting: Gastroenterology

## 2017-06-12 VITALS — HR 82 | Ht 60.0 in | Wt 246.0 lb

## 2017-06-12 DIAGNOSIS — C241 Malignant neoplasm of ampulla of Vater: Secondary | ICD-10-CM

## 2017-06-12 NOTE — Progress Notes (Signed)
HPI: This is a very pleasant 71 year old woman who is here with her daughter.  She presented 2 or 3 months ago with relatively painless jaundice and was found to have a large periampullary mass.  She underwent incomplete ERCP due to the mass.  Eventual interventional radiology drain and then stenting of the distal bile duct stricture caused by the large ampullary mass.  She was referred to [facility] Dr. Gayleen Orem who did piecemeal polypectomy of the mass.  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  Margin is indeterminant, specimen is received fragmented.  Evaluation is limited by cautery effect" he placed an ERCP biliary stent at the end of the case.  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  She sought medical oncology last week and there is a discussion about potential palliative chemotherapy.   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  She is eating normally.  She is having no GI bleeding symptoms.  She is not having any significant abdominal pains.  No nausea or vomiting.  I recently recommended that she discuss potassium supplementation with her PCP and she was given a prescription for K-Lor, 10 mEq pills.  She has not started them yet.  ROS: complete GI ROS as described in HPI, all other review negative.  Constitutional:  No unintentional weight loss   Past Medical History:  Diagnosis Date  . Clotting disorder ([place])    right DVT  . GERD (gastroesophageal reflux disease)   . Gout   . Gout   . Hypertension   . RA (rheumatoid arthritis) ([place])     Past Surgical History:  Procedure Laterality Date  . ABDOMINAL HYSTERECTOMY    .  CHOLECYSTECTOMY    . ESOPHAGOGASTRODUODENOSCOPY (EGD) WITH PROPOFOL N/A 03/24/2017   Procedure: ESOPHAGOGASTRODUODENOSCOPY (EGD) WITH PROPOFOL;  Surgeon: Doran Stabler, MD;  Location: [facility] ENDOSCOPY;  Service: Gastroenterology;  Laterality: N/A;  . EUS N/A 04/06/2017   Procedure: UPPER ENDOSCOPIC ULTRASOUND (EUS) LINEAR;  Surgeon: [name], MD;  Location: [facility] ENDOSCOPY;  Service: Endoscopy;  Laterality: N/A;  to evaluate duodenal mass  . HERNIA REPAIR    . IR BILIARY DRAIN PLACEMENT WITH CHOLANGIOGRAM  03/25/2017  . IR CONVERT BILIARY DRAIN TO INT EXT BILIARY DRAIN  04/03/2017    Current Outpatient Medications  Medication Sig Dispense Refill  . allopurinol (ZYLOPRIM) 300 MG tablet Take 300 mg by mouth daily.     Marland Kitchen ALPRAZolam (XANAX) 0.25 MG tablet Take 0.25 mg by mouth at bedtime.   2  . amiodarone (PACERONE) 200 MG tablet One tablet daily 30 tablet 2  . apixaban (ELIQUIS) 5 MG TABS tablet Take 1 tablet (5 mg total) by mouth 2 (two) times daily. Stop taking this medication on October 9th, 2018. 60 tablet 0  . chlorthalidone (HYGROTON) 25 MG tablet Take 25 mg by mouth daily.    . cholecalciferol (VITAMIN D) 1000 units tablet Take 1,000 Units by mouth daily.    Marland Kitchen diltiazem (CARDIZEM CD) 240 MG 24 hr capsule Take 1 capsule (240 mg total) by mouth daily. 30 capsule 5  . losartan (COZAAR) 25 MG tablet Take 25 mg by mouth daily.    . pantoprazole (PROTONIX) 40 MG tablet Take 1 tablet (40 mg total) by  mouth daily. 30 tablet 1  . vitamin B-12 (CYANOCOBALAMIN) 1000 MCG tablet Take 1,000 mcg by mouth daily.     No current facility-administered medications for this visit.     Allergies as of 06/12/2017 - Review Complete 06/12/2017  Allergen Reaction Noted  . Codeine Nausea And Vomiting 08/18/2015    Family History  Problem Relation Age of Onset  . Cancer Father        prostate ca    Social History   Socioeconomic History  . Marital status: Divorced    Spouse name: Not on file  .  Number of children: 2  . Years of education: Not on file  . Highest education level: Not on file  Social Needs  . Financial resource strain: Not on file  . Food insecurity - worry: Not on file  . Food insecurity - inability: Not on file  . Transportation needs - medical: Not on file  . Transportation needs - non-medical: Not on file  Occupational History  . Not on file  Tobacco Use  . Smoking status: Never Smoker  . Smokeless tobacco: Never Used  Substance and Sexual Activity  . Alcohol use: No  . Drug use: No  . 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  Other Topics Concern  . Not on file  Social History Narrative  . 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  She will hold her Eliquis starting today.  My goal is to biopsy the ampullary region to see if we can prove whether or not she still has neoplasm in situ.  She understands there is a good chance that that is the case given the pathological findings in the size of the lesion endoscopically removed 2 months ago.  I will also plan to take the ERCP placed biliary stent out and reexamine with cholangiogram to see if she is developing a stricture or if she can go without a stent.  Please see the "Patient Instructions" section for addition details about the plan.  Owens Loffler, MD [facility] 06/12/2017, 2:30 PM

## 2017-06-12 NOTE — Patient Instructions (Addendum)
ERCP later this week.  Hold eliquis for now.  Start the potassium supplement that was previously prescribed by your PCP.  Normal BMI (Body Mass Index- based on height and weight) is between 23 and 30. Your BMI today is Body mass index is 48.04 kg/m. Marland Kitchen Please consider follow up  regarding your BMI with your Primary Care Provider.

## 2017-06-12 NOTE — H&P (View-Only) (Signed)
HPI: This is a very pleasant 71 year old woman who is here with her daughter.  She presented 2 or 3 months ago with relatively painless jaundice and was found to have a large periampullary mass.  She underwent incomplete ERCP due to the mass.  Eventual interventional radiology drain and then stenting of the distal bile duct stricture caused by the large ampullary mass.  She was referred to [facility] Dr. Gayleen Orem who did piecemeal polypectomy of the mass.  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  Margin is indeterminant, specimen is received fragmented.  Evaluation is limited by cautery effect" he placed an ERCP biliary stent at the end of the case.  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  She sought medical oncology last week and there is a discussion about potential palliative chemotherapy.   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  She is eating normally.  She is having no GI bleeding symptoms.  She is not having any significant abdominal pains.  No nausea or vomiting.  I recently recommended that she discuss potassium supplementation with her PCP and she was given a prescription for K-Lor, 10 mEq pills.  She has not started them yet.  ROS: complete GI ROS as described in HPI, all other review negative.  Constitutional:  No unintentional weight loss   Past Medical History:  Diagnosis Date  . Clotting disorder ([place])    right DVT  . GERD (gastroesophageal reflux disease)   . Gout   . Gout   . Hypertension   . RA (rheumatoid arthritis) ([place])     Past Surgical History:  Procedure Laterality Date  . ABDOMINAL HYSTERECTOMY    .  CHOLECYSTECTOMY    . ESOPHAGOGASTRODUODENOSCOPY (EGD) WITH PROPOFOL N/A 03/24/2017   Procedure: ESOPHAGOGASTRODUODENOSCOPY (EGD) WITH PROPOFOL;  Surgeon: Doran Stabler, MD;  Location: [facility];  Service: Gastroenterology;  Laterality: N/A;  . EUS N/A 04/06/2017   Procedure: UPPER ENDOSCOPIC ULTRASOUND (EUS) LINEAR;  Surgeon: [name], MD;  Location: [facility];  Service: Endoscopy;  Laterality: N/A;  to evaluate duodenal mass  . HERNIA REPAIR    . IR BILIARY DRAIN PLACEMENT WITH CHOLANGIOGRAM  03/25/2017  . IR CONVERT BILIARY DRAIN TO INT EXT BILIARY DRAIN  04/03/2017    Current Outpatient Medications  Medication Sig Dispense Refill  . allopurinol (ZYLOPRIM) 300 MG tablet Take 300 mg by mouth daily.     Marland Kitchen ALPRAZolam (XANAX) 0.25 MG tablet Take 0.25 mg by mouth at bedtime.   2  . amiodarone (PACERONE) 200 MG tablet One tablet daily 30 tablet 2  . apixaban (ELIQUIS) 5 MG TABS tablet Take 1 tablet (5 mg total) by mouth 2 (two) times daily. Stop taking this medication on October 9th, 2018. 60 tablet 0  . chlorthalidone (HYGROTON) 25 MG tablet Take 25 mg by mouth daily.    . cholecalciferol (VITAMIN D) 1000 units tablet Take 1,000 Units by mouth daily.    Marland Kitchen diltiazem (CARDIZEM CD) 240 MG 24 hr capsule Take 1 capsule (240 mg total) by mouth daily. 30 capsule 5  . losartan (COZAAR) 25 MG tablet Take 25 mg by mouth daily.    . pantoprazole (PROTONIX) 40 MG tablet Take 1 tablet (40 mg total) by  mouth daily. 30 tablet 1  . vitamin B-12 (CYANOCOBALAMIN) 1000 MCG tablet Take 1,000 mcg by mouth daily.     No current facility-administered medications for this visit.     Allergies as of 06/12/2017 - Review Complete 06/12/2017  Allergen Reaction Noted  . Codeine Nausea And Vomiting 08/18/2015    Family History  Problem Relation Age of Onset  . Cancer Father        prostate ca    Social History   Socioeconomic History  . Marital status: Divorced    Spouse name: Not on file  .  Number of children: 2  . Years of education: Not on file  . Highest education level: Not on file  Social Needs  . Financial resource strain: Not on file  . Food insecurity - worry: Not on file  . Food insecurity - inability: Not on file  . Transportation needs - medical: Not on file  . Transportation needs - non-medical: Not on file  Occupational History  . Not on file  Tobacco Use  . Smoking status: Never Smoker  . Smokeless tobacco: Never Used  Substance and Sexual Activity  . Alcohol use: No  . Drug use: No  . 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  Other Topics Concern  . Not on file  Social History Narrative  . 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  She will hold her Eliquis starting today.  My goal is to biopsy the ampullary region to see if we can prove whether or not she still has neoplasm in situ.  She understands there is a good chance that that is the case given the pathological findings in the size of the lesion endoscopically removed 2 months ago.  I will also plan to take the ERCP placed biliary stent out and reexamine with cholangiogram to see if she is developing a stricture or if she can go without a stent.  Please see the "Patient Instructions" section for addition details about the plan.  Owens Loffler, MD [facility] 06/12/2017, 2:30 PM

## 2017-06-15 ENCOUNTER — Encounter (HOSPITAL_COMMUNITY)
Admission: RE | Disposition: A | Discharge status: Home / Self Care | Payer: Self-pay | Source: Ambulatory Visit | Attending: Gastroenterology

## 2017-06-15 ENCOUNTER — Ambulatory Visit (HOSPITAL_COMMUNITY): Payer: Medicare Other | Admitting: Registered Nurse

## 2017-06-15 ENCOUNTER — Ambulatory Visit (HOSPITAL_COMMUNITY): Payer: Medicare Other

## 2017-06-15 ENCOUNTER — Other Ambulatory Visit: Payer: Self-pay

## 2017-06-15 ENCOUNTER — Ambulatory Visit (HOSPITAL_COMMUNITY)
Admission: RE | Admit: 2017-06-15 | Discharge: 2017-06-15 | Disposition: A | Discharge status: Home / Self Care | Payer: Medicare Other | Source: Ambulatory Visit | Attending: Gastroenterology | Admitting: Gastroenterology

## 2017-06-15 ENCOUNTER — Telehealth: Payer: Self-pay

## 2017-06-15 ENCOUNTER — Encounter (HOSPITAL_COMMUNITY): Payer: Self-pay | Admitting: Registered Nurse

## 2017-06-15 DIAGNOSIS — M069 Rheumatoid arthritis, unspecified: Secondary | ICD-10-CM | POA: Diagnosis not present

## 2017-06-15 DIAGNOSIS — K219 Gastro-esophageal reflux disease without esophagitis: Secondary | ICD-10-CM | POA: Insufficient documentation

## 2017-06-15 DIAGNOSIS — M109 Gout, unspecified: Secondary | ICD-10-CM | POA: Diagnosis not present

## 2017-06-15 DIAGNOSIS — Z6841 Body Mass Index (BMI) 40.0 and over, adult: Secondary | ICD-10-CM | POA: Diagnosis not present

## 2017-06-15 DIAGNOSIS — K8309 Other cholangitis: Secondary | ICD-10-CM

## 2017-06-15 DIAGNOSIS — K8050 Calculus of bile duct without cholangitis or cholecystitis without obstruction: Secondary | ICD-10-CM

## 2017-06-15 DIAGNOSIS — Z79899 Other long term (current) drug therapy: Secondary | ICD-10-CM | POA: Insufficient documentation

## 2017-06-15 DIAGNOSIS — I82401 Acute embolism and thrombosis of unspecified deep veins of right lower extremity: Secondary | ICD-10-CM | POA: Insufficient documentation

## 2017-06-15 DIAGNOSIS — Z85068 Personal history of other malignant neoplasm of small intestine: Secondary | ICD-10-CM | POA: Diagnosis not present

## 2017-06-15 DIAGNOSIS — R17 Unspecified jaundice: Secondary | ICD-10-CM | POA: Insufficient documentation

## 2017-06-15 DIAGNOSIS — Z8042 Family history of malignant neoplasm of prostate: Secondary | ICD-10-CM | POA: Diagnosis not present

## 2017-06-15 DIAGNOSIS — D689 Coagulation defect, unspecified: Secondary | ICD-10-CM | POA: Insufficient documentation

## 2017-06-15 DIAGNOSIS — D490 Neoplasm of unspecified behavior of digestive system: Secondary | ICD-10-CM

## 2017-06-15 DIAGNOSIS — Z90710 Acquired absence of both cervix and uterus: Secondary | ICD-10-CM | POA: Diagnosis not present

## 2017-06-15 DIAGNOSIS — I10 Essential (primary) hypertension: Secondary | ICD-10-CM | POA: Diagnosis not present

## 2017-06-15 DIAGNOSIS — K831 Obstruction of bile duct: Secondary | ICD-10-CM | POA: Diagnosis not present

## 2017-06-15 DIAGNOSIS — R1013 Epigastric pain: Secondary | ICD-10-CM

## 2017-06-15 DIAGNOSIS — Z9049 Acquired absence of other specified parts of digestive tract: Secondary | ICD-10-CM | POA: Insufficient documentation

## 2017-06-15 DIAGNOSIS — K319 Disease of stomach and duodenum, unspecified: Secondary | ICD-10-CM

## 2017-06-15 DIAGNOSIS — Z885 Allergy status to narcotic agent status: Secondary | ICD-10-CM | POA: Insufficient documentation

## 2017-06-15 DIAGNOSIS — D132 Benign neoplasm of duodenum: Secondary | ICD-10-CM | POA: Insufficient documentation

## 2017-06-15 DIAGNOSIS — D500 Iron deficiency anemia secondary to blood loss (chronic): Secondary | ICD-10-CM

## 2017-06-15 DIAGNOSIS — C801 Malignant (primary) neoplasm, unspecified: Secondary | ICD-10-CM

## 2017-06-15 DIAGNOSIS — K3189 Other diseases of stomach and duodenum: Secondary | ICD-10-CM

## 2017-06-15 DIAGNOSIS — I739 Peripheral vascular disease, unspecified: Secondary | ICD-10-CM | POA: Insufficient documentation

## 2017-06-15 HISTORY — PX: ENDOSCOPIC RETROGRADE CHOLANGIOPANCREATOGRAPHY (ERCP) WITH PROPOFOL: SHX5810

## 2017-06-15 HISTORY — DX: Gastro-esophageal reflux disease without esophagitis: K21.9

## 2017-06-15 SURGERY — ENDOSCOPIC RETROGRADE CHOLANGIOPANCREATOGRAPHY (ERCP) WITH PROPOFOL
Anesthesia: General

## 2017-06-15 MED ORDER — LIDOCAINE 2% (20 MG/ML) 5 ML SYRINGE
INTRAMUSCULAR | Status: DC | PRN
  Administered 2017-06-15: 100 mg via INTRAVENOUS

## 2017-06-15 MED ORDER — ONDANSETRON HCL 4 MG/2ML IJ SOLN
INTRAMUSCULAR | Status: DC | PRN
  Administered 2017-06-15: 4 mg via INTRAVENOUS

## 2017-06-15 MED ORDER — PROPOFOL 10 MG/ML IV BOLUS
INTRAVENOUS | Status: DC | PRN
  Administered 2017-06-15: 150 mg via INTRAVENOUS
  Administered 2017-06-15: 50 mg via INTRAVENOUS

## 2017-06-15 MED ORDER — INDOMETHACIN 50 MG RE SUPP
RECTAL | Status: AC
  Filled 2017-06-15: qty 2

## 2017-06-15 MED ORDER — DEXAMETHASONE SODIUM PHOSPHATE 10 MG/ML IJ SOLN
INTRAMUSCULAR | Status: DC | PRN
  Administered 2017-06-15: 10 mg via INTRAVENOUS

## 2017-06-15 MED ORDER — INDOMETHACIN 50 MG RE SUPP
RECTAL | Status: DC | PRN
  Administered 2017-06-15: 100 mg via RECTAL

## 2017-06-15 MED ORDER — SODIUM CHLORIDE 0.9 % IV SOLN: INTRAVENOUS | Status: DC

## 2017-06-15 MED ORDER — FENTANYL CITRATE (PF) 100 MCG/2ML IJ SOLN
INTRAMUSCULAR | Status: AC
  Filled 2017-06-15: qty 2

## 2017-06-15 MED ORDER — GLUCAGON HCL RDNA (DIAGNOSTIC) 1 MG IJ SOLR
INTRAMUSCULAR | Status: AC
  Filled 2017-06-15: qty 1

## 2017-06-15 MED ORDER — SODIUM CHLORIDE 0.9 % IV SOLN
INTRAVENOUS | Status: DC | PRN
  Administered 2017-06-15: 30 mL

## 2017-06-15 MED ORDER — FENTANYL CITRATE (PF) 250 MCG/5ML IJ SOLN
INTRAMUSCULAR | Status: DC | PRN
Start: 2017-06-15 — End: 2017-06-15
  Administered 2017-06-15 (×2): 50 ug via INTRAVENOUS

## 2017-06-15 MED ORDER — MIDAZOLAM HCL 5 MG/5ML IJ SOLN
INTRAMUSCULAR | Status: DC | PRN
  Administered 2017-06-15: 2 mg via INTRAVENOUS

## 2017-06-15 MED ORDER — SUCCINYLCHOLINE CHLORIDE 200 MG/10ML IV SOSY
PREFILLED_SYRINGE | INTRAVENOUS | Status: DC | PRN
  Administered 2017-06-15: 100 mg via INTRAVENOUS

## 2017-06-15 MED ORDER — PROPOFOL 10 MG/ML IV BOLUS
INTRAVENOUS | Status: AC
  Filled 2017-06-15: qty 40

## 2017-06-15 MED ORDER — MIDAZOLAM HCL 2 MG/2ML IJ SOLN
INTRAMUSCULAR | Status: AC
  Filled 2017-06-15: qty 2

## 2017-06-15 MED ORDER — LACTATED RINGERS IV SOLN
INTRAVENOUS | Status: DC
  Administered 2017-06-15: 1000 mL via INTRAVENOUS

## 2017-06-15 NOTE — Discharge Instructions (Signed)
Monitored Anesthesia Care, Care After These instructions provide you with information about caring for yourself after your procedure. Your health care provider may also give you more specific instructions. Your treatment has been planned according to current medical practices, but problems sometimes occur. Call your health care provider if you have any problems or questions after your procedure. What can I expect after the procedure? After your procedure, it is common to:  Feel sleepy for several hours.  Feel clumsy and have poor balance for several hours.  Feel forgetful about what happened after the procedure.  Have poor judgment for several hours.  Feel nauseous or vomit.  Have a sore throat if you had a breathing tube during the procedure.  Follow these instructions at home: For at least 24 hours after the procedure:   Do not: ? Participate in activities in which you could fall or become injured. ? Drive. ? Use heavy machinery. ? Drink alcohol. ? Take sleeping pills or medicines that cause drowsiness. ? Make important decisions or sign legal documents. ? Take care of children on your own.  Rest. Eating and drinking  Follow the diet that is recommended by your health care provider.  If you vomit, drink water, juice, or soup when you can drink without vomiting.  Make sure you have little or no nausea before eating solid foods. General instructions  Have a responsible adult stay with you until you are awake and alert.  Take over-the-counter and prescription medicines only as told by your health care provider.  If you smoke, do not smoke without supervision.  Keep all follow-up visits as told by your health care provider. This is important. Contact a health care provider if:  You keep feeling nauseous or you keep vomiting.  You feel light-headed.  You develop a rash.  You have a fever. Get help right away if:  You have trouble breathing. This information is  not intended to replace advice given to you by your health care provider. Make sure you discuss any questions you have with your health care provider. Document Released: 10/04/2015 Document Revised: 02/03/2016 Document Reviewed: 10/04/2015 Elsevier Interactive Patient Education  2018 [employer]. Endoscopic Retrograde Cholangiopancreatogram, Care After This sheet gives you information about how to care for yourself after your procedure. Your health care provider may also give you more specific instructions. If you have problems or questions, contact your health care provider. What can I expect after the procedure? After the procedure, it is common to have:  Soreness in your throat.  Nausea.  Bloating.  Dizziness.  Tiredness (fatigue).  Follow these instructions at home:  Take over-the-counter and prescription medicines only as told by your health care provider.  Do not drive for 24 hours if you were given a medicine to help you relax (sedative) during your procedure. Have someone stay with you for 24 hours after the procedure.  Return to your normal activities as told by your health care provider. Ask your health care provider what activities are safe for you.  Return to eating what you normally do as soon as you feel well enough or as told by your health care provider.  Keep all follow-up visits as told by your health care provider. This is important. Contact a health care provider if:  You have pain in your abdomen that does not get better with medicine.  You develop signs of infection, such as: ? Chills. ? Feeling unwell. Get help right away if:  You have difficulty swallowing.  You have worsening pain in your throat, chest, or abdomen.  You vomit bright red blood or a substance that looks like coffee grounds.  You have bloody or very black stools.  You have a fever.  You have a sudden increase in swelling (bloating) in your abdomen. Summary  After the  procedure, it is common to feel tired and to have some discomfort in your throat.  Contact your health care provider if you have signs of infection--such as chills or feeling unwell--or if you have pain that does not improve with medicine.  Get help right away if you have trouble swallowing, worsening pain, bloody or black vomit, bloody or black stools, a fever, or increased swelling in your abdomen.  Keep all follow-up visits as told by your health care provider. This is important. This information is not intended to replace advice given to you by your health care provider. Make sure you discuss any questions you have with your health care provider. Document Released: 04/03/2013 Document Revised: 05/02/2016 Document Reviewed: 05/02/2016 Elsevier Interactive Patient Education  2017 [employer].

## 2017-06-15 NOTE — Transfer of Care (Signed)
Immediate Anesthesia Transfer of Care Note  Patient: Peggy Armstrong  Procedure(s) Performed: ENDOSCOPIC RETROGRADE CHOLANGIOPANCREATOGRAPHY (ERCP) WITH PROPOFOL (N/A )  Patient Location: PACU  Anesthesia Type:General  Level of Consciousness: sedated  Airway & Oxygen Therapy: Patient Spontanous Breathing and Patient connected to face mask oxygen  Post-op Assessment: Report given to RN and Post -op Vital signs reviewed and stable  Post vital signs: Reviewed and stable  Last Vitals:  Vitals:   06/15/17 0622  BP: 131/73  Pulse: 74  Resp: 20  Temp: 36.6 C  SpO2: 97%    Last Pain:  Vitals:   06/15/17 0622  TempSrc: Oral         Complications: No apparent anesthesia complications

## 2017-06-15 NOTE — Anesthesia Postprocedure Evaluation (Signed)
Anesthesia Post Note  Patient: Peggy Armstrong  Procedure(s) Performed: ENDOSCOPIC RETROGRADE CHOLANGIOPANCREATOGRAPHY (ERCP) WITH PROPOFOL (N/A )     Patient location during evaluation: PACU Anesthesia Type: General Level of consciousness: awake and alert Pain management: pain level controlled Vital Signs Assessment: post-procedure vital signs reviewed and stable Respiratory status: spontaneous breathing, nonlabored ventilation, respiratory function stable and patient connected to nasal cannula oxygen Cardiovascular status: blood pressure returned to baseline and stable Postop Assessment: no apparent nausea or vomiting Anesthetic complications: no    Last Vitals:  Vitals:   06/15/17 0930 06/15/17 0940  BP: 139/65 138/89  Pulse:  (!) 58  Resp:  19  Temp:    SpO2:  93%    Last Pain:  Vitals:   06/15/17 0840  TempSrc: Oral                 Lennette Fader S

## 2017-06-15 NOTE — Op Note (Signed)
[facility] Patient Name: Peggy Armstrong Procedure Date: 06/15/2017 MRN: [id number] Attending MD: [name] , MD Date of Birth: 1945/09/26 CSN: [id number] Age: 71 Admit Type: Outpatient Procedure:                ERCP Indications:              large ampullary neoplasm presented with painless                            jaundice, ERCP unable to cannulate, eventual IR                            in/ext biliary drain; mucosal biopsies and FNA of                            the ampullary mass showed adenomatous change but                            not frank cancer; Dr. Zenia Resides ERCP, polypectomy                            at [facility]; pathology showed adenomatous lesion with                            extensive HGD, intramucosal cancer with areas                            suspicious for submucosal invasion (extensive                            cautery effect). Very poor surgical candidate. Providers:                [name], MD, Cleda Daub, RN, Alan Mulder, Technician Referring MD:              Medicines:                General Anesthesia, Indomethacin 408 mg PR Complications:            No immediate complications. Estimated blood loss:                            None Estimated Blood Loss:     Estimated blood loss: none. Procedure:                Pre-Anesthesia Assessment:                           - Prior to the procedure, a History and Physical                            was performed, and patient medications and                            allergies were  reviewed. The patient's tolerance of                            previous anesthesia was also reviewed. The risks                            and benefits of the procedure and the sedation                            options and risks were discussed with the patient.                            All questions were answered, and informed consent                            was  obtained. Prior Anticoagulants: The patient has                            taken Eliquis (apixaban), last dose was 3 days                            prior to procedure. ASA Grade Assessment: III - A                            patient with severe systemic disease. After                            reviewing the risks and benefits, the patient was                            deemed in satisfactory condition to undergo the                            procedure.                           After obtaining informed consent, the scope was                            passed under direct vision. Throughout the                            procedure, the patient's blood pressure, pulse, and                            oxygen saturations were monitored continuously. The                            Duodenoscope was introduced through the mouth, and                            used to inject contrast into and used to inject  contrast into the bile duct. The ERCP was                            accomplished without difficulty. The patient                            tolerated the procedure well. Scope In: Scope Out: Findings:      The previously placed plastic biliary stent was extending into the       duodenum in good position. There was a single endoscopic clip in place       from previous polypectomy procedure. There was clear remaining       neoplastic mucosa at the site (approximately 1.5cm) which I sampled       extensively with biospy forceps. This was not overly malignant       appearing. I removed the previous plastic biliary stent and sent if for       cytology testing. I used a 4 Autotome over a .035 hydrawire to       cannulate the bile duct and injected contrast. Cholangiogram revealed a       diffusely dilated extrahepatic biliary tree (CBD 41mm, CHD 39mm) which       tapered into the head of pancreas until a point of stricture that was       about 5-27mm long in the most  distal CBD. I used a 12-38mm biliary       retrieval balloon to clear the bile duct of a small amount of       biodrebris, sludge. I then placed a 4cm long, 93mm diameter covered SEMS       across the distal CBD stricture extending about 1.5cm into the duodenum       in good position. The main pancreatic duct was never cannulated with       wire or injected with dye. Impression:               - 1.5cm residual neoplastic periampullary lesion                            with adjacent previously placed endoclip. The                            lesion was sampled with forceps extensively.                           - The previously placed platic biliary stent was                            removed with a snare and sent for cytology review.                           - Cholangiogram revealed diffusely dilated                            extrahepatic bile duct with a distal 5-77mm CBD                            stricture.                           -  4cm long, 90mm covered SEMS was placed across the                            stricture in good position Moderate Sedation:      N/A- Per Anesthesia Care Recommendation:           - Discharge patient to home (ambulatory).                           - Await final pathology, cytology.                           - Follow liver tests serially (my office will order                            for 1 month from now).                           - Will forward these results to Drs. Arnell Asal.                           - Could consider further polypectomy pending                            clinical course, pathology results. Procedure Code(s):        --- Professional ---                           [phone], Endoscopic retrograde                            cholangiopancreatography (ERCP); with removal and                            exchange of stent(s), biliary or pancreatic duct,                            including pre- and  post-dilation and guide wire                            passage, when performed, including sphincterotomy,                            when performed, each stent exchanged                           [phone], Unlisted procedure, biliary tract Diagnosis Code(s):        --- Professional ---                           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 All rights reserved. The codes documented in this report are preliminary and upon coder review may  be revised to  meet current compliance requirements. [name], MD 06/15/2017 8:51:40 AM This report has been signed electronically. Number of Addenda: 0

## 2017-06-15 NOTE — Interval H&P Note (Signed)
History and Physical Interval Note:  06/15/2017 7:12 AM  Peggy Armstrong  has presented today for surgery, with the diagnosis of remove/replace stent biopsy periampullar  The various methods of treatment have been discussed with the patient and family. After consideration of risks, benefits and other options for treatment, the patient has consented to  Procedure(s): ENDOSCOPIC RETROGRADE CHOLANGIOPANCREATOGRAPHY (ERCP) WITH PROPOFOL (N/A) as a surgical intervention .  The patient's history has been reviewed, patient examined, no change in status, stable for surgery.  I have reviewed the patient's chart and labs.  Questions were answered to the patient's satisfaction.     [name]

## 2017-06-15 NOTE — Anesthesia Preprocedure Evaluation (Signed)
Anesthesia Evaluation  Patient identified by MRN, date of birth, ID band Patient awake    Reviewed: Allergy & Precautions, NPO status , Patient's Chart, lab work & pertinent test results  Airway Mallampati: II   Neck ROM: full    Dental   Pulmonary neg pulmonary ROS,    breath sounds clear to auscultation       Cardiovascular hypertension, + Peripheral Vascular Disease and + DVT   Rhythm:regular Rate:Normal  Takes eliquis   Neuro/Psych    GI/Hepatic GERD  ,  Endo/Other  Morbid obesity  Renal/GU      Musculoskeletal  (+) Arthritis , Rheumatoid disorders,    Abdominal   Peds  Hematology   Anesthesia Other Findings   Reproductive/Obstetrics                             Anesthesia Physical Anesthesia Plan  ASA: III  Anesthesia Plan: General   Post-op Pain Management:    Induction: Intravenous  PONV Risk Score and Plan: 3 and Ondansetron, Dexamethasone and Treatment may vary due to age or medical condition  Airway Management Planned: Oral ETT  Additional Equipment:   Intra-op Plan:   Post-operative Plan: Extubation in OR  Informed Consent: I have reviewed the patients History and Physical, chart, labs and discussed the procedure including the risks, benefits and alternatives for the proposed anesthesia with the patient or authorized representative who has indicated his/her understanding and acceptance.     Plan Discussed with: CRNA, Anesthesiologist and Surgeon  Anesthesia Plan Comments:         Anesthesia Quick Evaluation

## 2017-06-15 NOTE — Telephone Encounter (Signed)
ERCP has been faxed to Dr Lysle Rubens, also labs in [place] for 1 month

## 2017-06-15 NOTE — Telephone Encounter (Signed)
-----   Message from [name], MD sent at 06/15/2017  8:53 AM EST ----- Dorris Fetch and Ni, See ERCP report.  Thanks. I'll let you know about final path   Lex Linhares, Can you forward a copy of this ERCP to Dr. Zenia Resides at [facility].  Also she needs LFTs in 1 month.  Thanks

## 2017-06-15 NOTE — Anesthesia Procedure Notes (Signed)
Procedure Name: Intubation Date/Time: 06/15/2017 7:45 AM Performed by: Talbot Grumbling, CRNA Pre-anesthesia Checklist: Patient identified, Emergency Drugs available, Suction available and Patient being monitored Patient Re-evaluated:Patient Re-evaluated prior to induction Oxygen Delivery Method: Circle system utilized Preoxygenation: Pre-oxygenation with 100% oxygen Induction Type: IV induction Ventilation: Mask ventilation without difficulty Laryngoscope Size: Mac and 3 Grade View: Grade I Tube size: 7.5 mm Number of attempts: 1 Airway Equipment and Method: Stylet Placement Confirmation: ETT inserted through vocal cords under direct vision,  positive ETCO2 and breath sounds checked- equal and bilateral Secured at: 21 cm Tube secured with: Tape Dental Injury: Teeth and Oropharynx as per pre-operative assessment

## 2017-06-18 ENCOUNTER — Encounter (HOSPITAL_COMMUNITY): Payer: Self-pay | Admitting: Gastroenterology

## 2017-06-21 NOTE — Progress Notes (Signed)
The pt's daughter has been advised of the appt and will wait to hear from Dr Stephania Fragmin office.  ( I sent a message to Dr Okey Dupre regarding appt)

## 2017-06-26 DIAGNOSIS — D689 Coagulation defect, unspecified: Secondary | ICD-10-CM | POA: Insufficient documentation

## 2017-06-26 DIAGNOSIS — K219 Gastro-esophageal reflux disease without esophagitis: Secondary | ICD-10-CM | POA: Insufficient documentation

## 2017-06-26 DIAGNOSIS — I10 Essential (primary) hypertension: Secondary | ICD-10-CM | POA: Insufficient documentation

## 2017-06-26 DIAGNOSIS — M109 Gout, unspecified: Secondary | ICD-10-CM | POA: Insufficient documentation

## 2017-07-03 ENCOUNTER — Encounter: Payer: Self-pay | Admitting: Hematology and Oncology

## 2017-07-03 ENCOUNTER — Inpatient Hospital Stay: Payer: Medicare Other | Attending: Hematology and Oncology | Admitting: Hematology and Oncology

## 2017-07-03 ENCOUNTER — Inpatient Hospital Stay: Payer: Medicare Other

## 2017-07-03 ENCOUNTER — Telehealth: Payer: Self-pay | Admitting: Hematology and Oncology

## 2017-07-03 DIAGNOSIS — M0579 Rheumatoid arthritis with rheumatoid factor of multiple sites without organ or systems involvement: Secondary | ICD-10-CM

## 2017-07-03 DIAGNOSIS — Z7901 Long term (current) use of anticoagulants: Secondary | ICD-10-CM | POA: Diagnosis not present

## 2017-07-03 DIAGNOSIS — C241 Malignant neoplasm of ampulla of Vater: Secondary | ICD-10-CM | POA: Diagnosis present

## 2017-07-03 DIAGNOSIS — I82501 Chronic embolism and thrombosis of unspecified deep veins of right lower extremity: Secondary | ICD-10-CM

## 2017-07-03 DIAGNOSIS — D500 Iron deficiency anemia secondary to blood loss (chronic): Secondary | ICD-10-CM

## 2017-07-03 DIAGNOSIS — D509 Iron deficiency anemia, unspecified: Secondary | ICD-10-CM | POA: Diagnosis not present

## 2017-07-03 DIAGNOSIS — E786 Lipoprotein deficiency: Secondary | ICD-10-CM | POA: Diagnosis not present

## 2017-07-03 DIAGNOSIS — E876 Hypokalemia: Secondary | ICD-10-CM | POA: Insufficient documentation

## 2017-07-03 DIAGNOSIS — Z79899 Other long term (current) drug therapy: Secondary | ICD-10-CM

## 2017-07-03 DIAGNOSIS — Z885 Allergy status to narcotic agent status: Secondary | ICD-10-CM | POA: Diagnosis not present

## 2017-07-03 DIAGNOSIS — N183 Chronic kidney disease, stage 3 unspecified: Secondary | ICD-10-CM

## 2017-07-03 HISTORY — DX: Hypokalemia: E87.6

## 2017-07-03 LAB — CBC WITH DIFFERENTIAL/PLATELET
ABS GRANULOCYTE: 5.1 10*3/uL (ref 1.5–6.5)
BASOS PCT: 0 %
Basophils Absolute: 0 10*3/uL (ref 0.0–0.1)
Eosinophils Absolute: 0.2 10*3/uL (ref 0.0–0.5)
Eosinophils Relative: 3 %
HEMATOCRIT: 35.1 % (ref 34.8–46.6)
HEMOGLOBIN: 10.7 g/dL — AB (ref 11.6–15.9)
Lymphocytes Relative: 13 %
Lymphs Abs: 0.9 10*3/uL (ref 0.9–3.3)
MCH: 24.6 pg — ABNORMAL LOW (ref 25.1–34.0)
MCHC: 30.5 g/dL — AB (ref 31.5–36.0)
MCV: 80.7 fL (ref 79.5–101.0)
MONOS PCT: 9 %
Monocytes Absolute: 0.6 10*3/uL (ref 0.1–0.9)
NEUTROS ABS: 5.1 10*3/uL (ref 1.5–6.5)
Neutrophils Relative %: 75 %
Platelets: 244 10*3/uL (ref 145–400)
RBC: 4.35 MIL/uL (ref 3.70–5.45)
RDW: 17.8 % — ABNORMAL HIGH (ref 11.2–16.1)
WBC: 6.8 10*3/uL (ref 3.9–10.3)

## 2017-07-03 LAB — BASIC METABOLIC PANEL
ANION GAP: 11 (ref 3–11)
BUN: 23 mg/dL (ref 7–26)
CO2: 28 mmol/L (ref 22–29)
Calcium: 9.3 mg/dL (ref 8.4–10.4)
Chloride: 101 mmol/L (ref 98–109)
Creatinine, Ser: 1.69 mg/dL — ABNORMAL HIGH (ref 0.60–1.10)
GFR calc Af Amer: 34 mL/min — ABNORMAL LOW (ref >60)
GFR, EST NON AFRICAN AMERICAN: 29 mL/min — AB (ref >60)
GLUCOSE: 102 mg/dL (ref 70–140)
POTASSIUM: 3.1 mmol/L — AB (ref 3.3–4.7)
Sodium: 140 mmol/L (ref 136–145)

## 2017-07-03 MED ORDER — OXYCODONE HCL 5 MG PO TABS: 5.0000 mg | ORAL_TABLET | ORAL | 0 refills | Status: DC | PRN

## 2017-07-03 NOTE — Assessment & Plan Note (Addendum)
Fortunately, repeat ERCP and biopsy excluded malignancy although a residual polyp is present after the biopsy. She will continue close follow-up with GI service

## 2017-07-03 NOTE — Telephone Encounter (Signed)
Gave patient AVS and calendar of upcoming April appointments.  °

## 2017-07-03 NOTE — Assessment & Plan Note (Signed)
She has mild persistent anemia I will call her once iron studies available She may need future intravenous iron treatment if she have recurrence of iron deficiency anemia again in the future I plan to see her back in 3 months

## 2017-07-03 NOTE — Progress Notes (Signed)
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 Mild intra hepatic and moderate extrahepatic biliary dilatation, post cholecystectomy. Recommend correlation with laboratory values, follow-up MR or ERCP as indicated 2. Sigmoid colon diverticular disease without acute inflammation.       03/22/2017 - 03/30/2017 Hospital Admission    She was admitted for management of biliary obstruction      03/23/2017 Imaging    1. Mild to moderately motion degraded exam. 2. Biliary duct dilatation after cholecystectomy. No choledocholithiasis or well-defined obstructive mass. Given the elevated bilirubin, consider further evaluation with ERCP to exclude ampullary stricture or otherwise occult ampullary lesion. 3. Hiatal hernia. 4. Iron deposition in the liver      03/24/2017 Procedure    She underwent EGD which revealed ulcerated mass in 2nd portion of duodenum      03/24/2017 Pathology Results    Duodenum, Biopsy, duodenum mass - ADENOMA. - NO HIGH GRADE DYSPLASIA OR MALIGNANCY      03/24/2017 Procedure    Status post percutaneous transhepatic cholangiogram with placement of a left approach externalized biliary drain.      03/29/2017 Imaging    CT chest 1. Small to moderate layering bilateral pleural effusions are new since 03/23/2017. Superimposed pulmonary atelectasis with no definite pneumonia. 2. Rounded but fluid density 3 cm area in the mediastinum anterior to the carina is favored to be pericardial fluid in a superior pericardial recess (normal variant). A follow-up Chest CT with IV contrast (such as in 3 months time) is recommended to document stability. 3. Partially visible upper abdomen transhepatic biliary drain with no adverse features. 4. Cardiomegaly.       04/03/2017 Procedure    Status post routine exchange of left hepatic approach external biliary drain for a new, modified, 10 [place] internal/ external drain.       04/06/2017 Pathology Results    Duodenum, Biopsy, peri ampullary mass - DUODENAL TUBULOVILLOUS ADENOMA. - NO HIGH GRADE DYSPLASIA OR INVASIVE CARCINOMA      04/06/2017 Procedure    EUS - 3.5cm soft, round ampullary neoplasm without evident deep invasion into the pancreas, duodenal wall. I repeated mucosal biopsies of the mass which previously showed adenoma without HGD. I also performed EUS FNA from deeper within the mass, await final cytology results. - Capped internal/external biliary drain is in good position extending into the duodenum lumen from within the mass.      04/06/2017 Pathology Results    FINE NEEDLE ASPIRATION, ENDOSCOPIC PERI AMPULLARY MASS ( SPECIMEN 1 OF 1 COLLECTED 04/06/2017) ATYPICAL GLANDULAR CELLS, SEE COMMENT. Preliminary Diagnosis Intraoperative Diagnosis: No overt malignant features (JM)      05/02/2017 Pathology Results    Biopsy showed high grade dysplasia/intramucosal adenocarcinoma with areas suspicious for submucosal invasion      05/02/2017 Procedure    She underwent sphincterotomy, biopsy and biliary stent placement      06/15/2017 Procedure    - 1.5cm residual neoplastic periampullary lesion with adjacent previously placed endoclip. The lesion was sampled with forceps extensively.  - The previously placed platic biliary stent was removed with a snare and sent for cytology review. 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 - 4cm long, 88mm covered SEMS was placed across the stricture  in good position        06/16/2017 Pathology Results    Duodenum, Biopsy, ampullary - DUODENAL TUBULOVILLOUS ADENOMA. - THERE IS NO EVIDENCE OF HIGH GRADE DYSPLASIA.       INTERVAL HISTORY: Please see below for problem oriented  charting. She returns with her daughter for further follow-up She has mild intermittent pain once in a while but denies significant nausea, epigastric pain or right upper quadrant pain. She tolerated recent ERCP well The patient denies any recent signs or symptoms of bleeding such as spontaneous epistaxis, hematuria or hematochezia.  REVIEW OF SYSTEMS:   Constitutional: Denies fevers, chills or abnormal weight loss Eyes: Denies blurriness of vision Ears, nose, mouth, throat, and face: Denies mucositis or sore throat Respiratory: Denies cough, dyspnea or wheezes Cardiovascular: Denies palpitation, chest discomfort or lower extremity swelling Gastrointestinal:  Denies nausea, heartburn or change in bowel habits Skin: Denies abnormal skin rashes Lymphatics: Denies new lymphadenopathy or easy bruising Neurological:Denies numbness, tingling or new weaknesses Behavioral/Psych: Mood is stable, no new changes  All other systems were reviewed with the patient and are negative.  I have reviewed the past medical history, past surgical history, social history and family history with the patient and they are unchanged from previous note.  ALLERGIES:  is allergic to codeine.  MEDICATIONS:  Current Outpatient Medications  Medication Sig Dispense Refill  . allopurinol (ZYLOPRIM) 300 MG tablet Take 300 mg by mouth daily.     Marland Kitchen ALPRAZolam (XANAX) 0.25 MG tablet Take 0.25 mg by mouth at bedtime.   2  . amiodarone (PACERONE) 200 MG tablet One tablet daily 30 tablet 2  . apixaban (ELIQUIS) 5 MG TABS tablet Take 1 tablet (5 mg total) by mouth 2 (two) times daily. Stop taking this medication on October 9th, 2018. 60 tablet 0  . chlorthalidone (HYGROTON) 25 MG tablet Take 25 mg by mouth daily.    . cholecalciferol (VITAMIN D) 1000 units tablet Take 1,000 Units by mouth daily.    Marland Kitchen diltiazem (CARDIZEM CD) 240 MG 24 hr capsule Take 1 capsule (240 mg total) by mouth daily. 30 capsule 5  . losartan (COZAAR) 25  MG tablet Take 25 mg by mouth daily.    Marland Kitchen oxyCODONE (OXY IR/ROXICODONE) 5 MG immediate release tablet Take 1 tablet (5 mg total) by mouth every 4 (four) hours as needed for severe pain. 30 tablet 0  . pantoprazole (PROTONIX) 40 MG tablet Take 1 tablet (40 mg total) by mouth daily. 30 tablet 1  . potassium chloride (K-DUR,KLOR-CON) 10 MEQ tablet Take 10 mEq by mouth daily.    . vitamin B-12 (CYANOCOBALAMIN) 1000 MCG tablet Take 1,000 mcg by mouth daily.     No current facility-administered medications for this visit.     PHYSICAL EXAMINATION: ECOG PERFORMANCE STATUS: 1 - Symptomatic but completely ambulatory  Vitals:   07/03/17 1524  BP: (!) 155/78  Pulse: 68  Resp: 18  Temp: 97.7 F (36.5 C)  SpO2: 100%   Filed Weights   07/03/17 1524  Weight: 245 lb 9.6 oz (111.4 kg)    GENERAL:alert, no distress and comfortable SKIN: skin color, texture, turgor are normal, no rashes or significant lesions EYES: normal, Conjunctiva are pink and non-injected, sclera clear OROPHARYNX:no exudate, no erythema and lips, buccal mucosa, and tongue normal  NECK: supple, thyroid normal size, non-tender, without nodularity LYMPH:  no palpable lymphadenopathy in the cervical, axillary or inguinal LUNGS: clear to auscultation and percussion with normal breathing effort HEART: regular rate &  rhythm and no murmurs and no lower extremity edema ABDOMEN:abdomen soft, non-tender and normal bowel sounds Musculoskeletal:no cyanosis of digits and no clubbing  NEURO: alert & oriented x 3 with fluent speech, no focal motor/sensory deficits  LABORATORY DATA:  I have reviewed the data as listed    Component Value Date/Time   NA 140 07/03/2017 1540   NA 142 08/11/2016 0936   K 3.1 (L) 07/03/2017 1540   K 3.4 (L) 08/11/2016 0936   CL 101 07/03/2017 1540   CO2 28 07/03/2017 1540   CO2 25 08/11/2016 0936   GLUCOSE 102 07/03/2017 1540   GLUCOSE 119 08/11/2016 0936   BUN 23 07/03/2017 1540   BUN 23.3 08/11/2016  0936   CREATININE 1.69 (H) 07/03/2017 1540   CREATININE 1.3 (H) 08/11/2016 0936   CALCIUM 9.3 07/03/2017 1540   CALCIUM 9.6 08/11/2016 0936   PROT 7.3 05/25/2017 1549   PROT 6.8 08/11/2016 0936   ALBUMIN 3.7 05/25/2017 1549   ALBUMIN 3.3 (L) 08/11/2016 0936   AST 10 05/25/2017 1549   AST 16 08/11/2016 0936   ALT 9 05/25/2017 1549   ALT 18 08/11/2016 0936   ALKPHOS 110 05/25/2017 1549   ALKPHOS 121 08/11/2016 0936   BILITOT 0.4 05/25/2017 1549   BILITOT 0.36 08/11/2016 0936   GFRNONAA 29 (L) 07/03/2017 1540   GFRAA 34 (L) 07/03/2017 1540    No results found for: SPEP, UPEP  Lab Results  Component Value Date   WBC 6.8 07/03/2017   NEUTROABS 5.1 07/03/2017   HGB 10.7 (L) 07/03/2017   HCT 35.1 07/03/2017   MCV 80.7 07/03/2017   PLT 244 07/03/2017      Chemistry      Component Value Date/Time   NA 140 07/03/2017 1540   NA 142 08/11/2016 0936   K 3.1 (L) 07/03/2017 1540   K 3.4 (L) 08/11/2016 0936   CL 101 07/03/2017 1540   CO2 28 07/03/2017 1540   CO2 25 08/11/2016 0936   BUN 23 07/03/2017 1540   BUN 23.3 08/11/2016 0936   CREATININE 1.69 (H) 07/03/2017 1540   CREATININE 1.3 (H) 08/11/2016 0936      Component Value Date/Time   CALCIUM 9.3 07/03/2017 1540   CALCIUM 9.6 08/11/2016 0936   ALKPHOS 110 05/25/2017 1549   ALKPHOS 121 08/11/2016 0936   AST 10 05/25/2017 1549   AST 16 08/11/2016 0936   ALT 9 05/25/2017 1549   ALT 18 08/11/2016 0936   BILITOT 0.4 05/25/2017 1549   BILITOT 0.36 08/11/2016 0936       RADIOGRAPHIC STUDIES: I have personally reviewed the radiological images as listed and agreed with the findings in the report. Dg Ercp  Result Date: 06/15/2017 CLINICAL DATA:  History of obstructing ampullary mass. Biliary obstruction EXAM: ERCP with removal of the endoscopic stent and insertion of a covered self expanding stent. TECHNIQUE: Multiple spot images obtained with the fluoroscopic device and submitted for interpretation post-procedure.  FLUOROSCOPY TIME:  Fluoroscopy Time:  3 minutes 48 seconds COMPARISON:  04/03/2017 FINDINGS: Spot fluoroscopic intraoperative views during the ERCP demonstrate removal of the endoscopic stent. Cholangiogram demonstrates biliary dilatation extending to the ampulla. Successful insertion of a distal CBD self expanding covered stent. IMPRESSION: Limited imaging during ERCP with stent exchange as above. These images were submitted for radiologic interpretation only. Please see the procedural report for the amount of contrast and the fluoroscopy time utilized. Electronically Signed   By: Jerilynn Mages.  Shick M.D.   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  All questions were answered. The patient knows to call the clinic with any problems, questions or concerns. No barriers to learning was detected. I spent 15 minutes counseling the patient face to face. The total time spent in the appointment was 20 minutes and more than 50% was on counseling and review of test results     Heath Lark, MD 07/03/2017 4:46 PM

## 2017-07-03 NOTE — Assessment & Plan Note (Signed)
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

## 2017-07-03 NOTE — Assessment & Plan Note (Signed)
This is stable.  She will continue medical management

## 2017-07-04 ENCOUNTER — Telehealth: Payer: Self-pay | Admitting: *Deleted

## 2017-07-04 ENCOUNTER — Other Ambulatory Visit: Payer: Self-pay

## 2017-07-04 LAB — FERRITIN: Ferritin: 15 ng/mL (ref 9–269)

## 2017-07-04 MED ORDER — AMIODARONE HCL 200 MG PO TABS: ORAL_TABLET | ORAL | 0 refills | Status: DC

## 2017-07-04 NOTE — Telephone Encounter (Signed)
-----   Message from Heath Lark, MD sent at 07/04/2017 10:43 AM EST ----- Regarding: labs Pls call daughter/pt. She is still anemic and low potassium.  Call if she is interested for repeat IV iron? Also, recommend potassium rich diet. I think she is already on potassium supplement

## 2017-07-04 NOTE — Telephone Encounter (Signed)
LM for daughter with note below. Requested call back to discuss

## 2017-07-05 ENCOUNTER — Other Ambulatory Visit: Payer: Self-pay | Admitting: Hematology and Oncology

## 2017-07-05 ENCOUNTER — Telehealth: Payer: Self-pay | Admitting: *Deleted

## 2017-07-05 NOTE — Telephone Encounter (Signed)
-----   Message from Heath Lark, MD sent at 07/04/2017 10:43 AM EST ----- Regarding: labs Pls call daughter/pt. She is still anemic and low potassium.  Call if she is interested for repeat IV iron? Also, recommend potassium rich diet. I think she is already on potassium supplement

## 2017-07-05 NOTE — Telephone Encounter (Signed)
Pt is willing to come in for iron. Please have schedulers call daughter to make appt due to her work schedule.

## 2017-07-05 NOTE — Telephone Encounter (Signed)
I placed scheduling msg 

## 2017-07-07 ENCOUNTER — Telehealth: Payer: Self-pay | Admitting: Hematology and Oncology

## 2017-07-07 NOTE — Telephone Encounter (Signed)
Spoke to patients daughter regarding upcoming January appointments per 1/9 sch message.

## 2017-07-10 ENCOUNTER — Inpatient Hospital Stay: Payer: Medicare Other

## 2017-07-10 VITALS — BP 146/59 | HR 55 | Resp 18

## 2017-07-10 DIAGNOSIS — C241 Malignant neoplasm of ampulla of Vater: Secondary | ICD-10-CM | POA: Diagnosis not present

## 2017-07-10 DIAGNOSIS — D500 Iron deficiency anemia secondary to blood loss (chronic): Secondary | ICD-10-CM

## 2017-07-10 MED ORDER — SODIUM CHLORIDE 0.9 % IV SOLN
Freq: Once | INTRAVENOUS | Status: AC
  Administered 2017-07-10: 16:00:00 via INTRAVENOUS

## 2017-07-10 MED ORDER — SODIUM CHLORIDE 0.9 % IV SOLN
510.0000 mg | Freq: Once | INTRAVENOUS | Status: AC
  Administered 2017-07-10: 510 mg via INTRAVENOUS
  Filled 2017-07-10: qty 17

## 2017-07-11 ENCOUNTER — Ambulatory Visit: Payer: Medicare Other | Admitting: Gastroenterology

## 2017-07-17 ENCOUNTER — Inpatient Hospital Stay: Payer: Medicare Other

## 2017-07-17 ENCOUNTER — Telehealth: Payer: Self-pay | Admitting: Hematology and Oncology

## 2017-07-17 ENCOUNTER — Other Ambulatory Visit: Payer: Self-pay | Admitting: Hematology and Oncology

## 2017-07-17 VITALS — BP 131/63 | HR 51 | Temp 98.2°F | Resp 18

## 2017-07-17 DIAGNOSIS — D500 Iron deficiency anemia secondary to blood loss (chronic): Secondary | ICD-10-CM

## 2017-07-17 DIAGNOSIS — M0579 Rheumatoid arthritis with rheumatoid factor of multiple sites without organ or systems involvement: Secondary | ICD-10-CM

## 2017-07-17 DIAGNOSIS — C241 Malignant neoplasm of ampulla of Vater: Secondary | ICD-10-CM | POA: Diagnosis not present

## 2017-07-17 DIAGNOSIS — I82501 Chronic embolism and thrombosis of unspecified deep veins of right lower extremity: Secondary | ICD-10-CM

## 2017-07-17 LAB — CBC WITH DIFFERENTIAL/PLATELET
BASOS ABS: 0 10*3/uL (ref 0.0–0.1)
Basophils Relative: 0 %
EOS ABS: 0.1 10*3/uL (ref 0.0–0.5)
EOS PCT: 2 %
HCT: 36.3 % (ref 34.8–46.6)
Hemoglobin: 11 g/dL — ABNORMAL LOW (ref 11.6–15.9)
Lymphocytes Relative: 9 %
Lymphs Abs: 0.6 10*3/uL — ABNORMAL LOW (ref 0.9–3.3)
MCH: 25.1 pg (ref 25.1–34.0)
MCHC: 30.3 g/dL — ABNORMAL LOW (ref 31.5–36.0)
MCV: 82.7 fL (ref 79.5–101.0)
MONO ABS: 0.6 10*3/uL (ref 0.1–0.9)
Monocytes Relative: 9 %
Neutro Abs: 5.4 10*3/uL (ref 1.5–6.5)
Neutrophils Relative %: 80 %
Platelets: 218 10*3/uL (ref 145–400)
RBC: 4.39 MIL/uL (ref 3.70–5.45)
RDW: 19.8 % — AB (ref 11.2–16.1)
WBC: 6.7 10*3/uL (ref 3.9–10.3)

## 2017-07-17 LAB — BASIC METABOLIC PANEL
ANION GAP: 11 (ref 3–11)
BUN: 22 mg/dL (ref 7–26)
CALCIUM: 9.2 mg/dL (ref 8.4–10.4)
CO2: 26 mmol/L (ref 22–29)
Chloride: 103 mmol/L (ref 98–109)
Creatinine, Ser: 1.52 mg/dL — ABNORMAL HIGH (ref 0.60–1.10)
GFR calc Af Amer: 39 mL/min — ABNORMAL LOW (ref >60)
GFR, EST NON AFRICAN AMERICAN: 33 mL/min — AB (ref >60)
Glucose, Bld: 116 mg/dL (ref 70–140)
POTASSIUM: 3.4 mmol/L (ref 3.3–4.7)
SODIUM: 140 mmol/L (ref 136–145)

## 2017-07-17 MED ORDER — SODIUM CHLORIDE 0.9 % IV SOLN
510.0000 mg | Freq: Once | INTRAVENOUS | Status: AC
  Administered 2017-07-17: 510 mg via INTRAVENOUS
  Filled 2017-07-17: qty 17

## 2017-07-17 NOTE — Patient Instructions (Addendum)
Anemia Anemia is a condition in which you do not have enough red blood cells or hemoglobin. Hemoglobin is a substance in red blood cells that carries oxygen. When you do not have enough red blood cells or hemoglobin (are anemic), your body cannot get enough oxygen and your organs may not work properly. As a result, you may feel very tired or have other problems. What are the causes? Common causes of anemia include:  Excessive bleeding. Anemia can be caused by excessive bleeding inside or outside the body, including bleeding from the intestine or from periods in women.  Poor nutrition.  Long-lasting (chronic) kidney, thyroid, and liver disease.  Bone marrow disorders.  Cancer and treatments for cancer.  HIV (human immunodeficiency virus) and AIDS (acquired immunodeficiency syndrome).  Treatments for HIV and AIDS.  Spleen problems.  Blood disorders.  Infections, medicines, and autoimmune disorders that destroy red blood cells.  What are the signs or symptoms? Symptoms of this condition include:  Minor weakness.  Dizziness.  Headache.  Feeling heartbeats that are irregular or faster than normal (palpitations).  Shortness of breath, especially with exercise.  Paleness.  Cold sensitivity.  Indigestion.  Nausea.  Difficulty sleeping.  Difficulty concentrating.  Symptoms may occur suddenly or develop slowly. If your anemia is mild, you may not have symptoms. How is this diagnosed? This condition is diagnosed based on:  Blood tests.  Your medical history.  A physical exam.  Bone marrow biopsy.  Your health care provider may also check your stool (feces) for blood and may do additional testing to look for the cause of your bleeding. You may also have other tests, including:  Imaging tests, such as a CT scan or MRI.  Endoscopy.  Colonoscopy.  How is this treated? Treatment for this condition depends on the cause. If you continue to lose a lot of blood,  you may need to be treated at a hospital. Treatment may include:  Taking supplements of iron, vitamin B12, or folic acid.  Taking a hormone medicine (erythropoietin) that can help to stimulate red blood cell growth.  Having a blood transfusion. This may be needed if you lose a lot of blood.  Making changes to your diet.  Having surgery to remove your spleen.  Follow these instructions at home:  Take over-the-counter and prescription medicines only as told by your health care provider.  Take supplements only as told by your health care provider.  Follow any diet instructions that you were given.  Keep all follow-up visits as told by your health care provider. This is important. Contact a health care provider if:  You develop new bleeding anywhere in the body. Get help right away if:  You are very weak.  You are short of breath.  You have pain in your abdomen or chest.  You are dizzy or feel faint.  You have trouble concentrating.  You have bloody or black, tarry stools.  You vomit repeatedly or you vomit up blood. Summary  Anemia is a condition in which you do not have enough red blood cells or enough of a substance in your red blood cells that carries oxygen (hemoglobin).  Symptoms may occur suddenly or develop slowly.  If your anemia is mild, you may not have symptoms.  This condition is diagnosed with blood tests as well as a medical history and physical exam. Other tests may be needed.  Treatment for this condition depends on the cause of the anemia. This information is not intended to replace advice   given to you by your health care provider. Make sure you discuss any questions you have with your health care provider. Document Released: 07/21/2004 Document Revised: 07/15/2016 Document Reviewed: 07/15/2016 Elsevier Interactive Patient Education  2018 [employer].   Ferumoxytol injection (feraheme) What is this medicine? FERUMOXYTOL is an iron complex. Iron  is used to make healthy red blood cells, which carry oxygen and nutrients throughout the body. This medicine is used to treat iron deficiency anemia in people with chronic kidney disease. This medicine may be used for other purposes; ask your health care provider or pharmacist if you have questions. COMMON BRAND NAME(S): Feraheme What should I tell my health care provider before I take this medicine? They need to know if you have any of these conditions: -anemia not caused by low iron levels -high levels of iron in the blood -magnetic resonance imaging (MRI) test scheduled -an unusual or allergic reaction to iron, other medicines, foods, dyes, or preservatives -pregnant or trying to get pregnant -breast-feeding How should I use this medicine? This medicine is for injection into a vein. It is given by a health care professional in a hospital or clinic setting. Talk to your pediatrician regarding the use of this medicine in children. Special care may be needed. Overdosage: If you think you have taken too much of this medicine contact a poison control center or emergency room at once. NOTE: This medicine is only for you. Do not share this medicine with others. What if I miss a dose? It is important not to miss your dose. Call your doctor or health care professional if you are unable to keep an appointment. What may interact with this medicine? This medicine may interact with the following medications: -other iron products This list may not describe all possible interactions. Give your health care provider a list of all the medicines, herbs, non-prescription drugs, or dietary supplements you use. Also tell them if you smoke, drink alcohol, or use illegal drugs. Some items may interact with your medicine. What should I watch for while using this medicine? Visit your doctor or healthcare professional regularly. Tell your doctor or healthcare professional if your symptoms do not start to get better  or if they get worse. You may need blood work done while you are taking this medicine. You may need to follow a special diet. Talk to your doctor. Foods that contain iron include: whole grains/cereals, dried fruits, beans, or peas, leafy green vegetables, and organ meats (liver, kidney). What side effects may I notice from receiving this medicine? Side effects that you should report to your doctor or health care professional as soon as possible: -allergic reactions like skin rash, itching or hives, swelling of the face, lips, or tongue -breathing problems -changes in blood pressure -feeling faint or lightheaded, falls -fever or chills -flushing, sweating, or hot feelings -swelling of the ankles or feet Side effects that usually do not require medical attention (report to your doctor or health care professional if they continue or are bothersome): -diarrhea -headache -nausea, vomiting -stomach pain This list may not describe all possible side effects. Call your doctor for medical advice about side effects. You may report side effects to FDA at 1-800-FDA-1088. Where should I keep my medicine? This drug is given in a hospital or clinic and will not be stored at home. NOTE: This sheet is a summary. It may not cover all possible information. If you have questions about this medicine, talk to your doctor, pharmacist, or health care provider.  2018 Elsevier/Gold Standard (2015-07-16 12:41:49) Ferumoxytol injection What is this medicine? FERUMOXYTOL is an iron complex. Iron is used to make healthy red blood cells, which carry oxygen and nutrients throughout the body. This medicine is used to treat iron deficiency anemia in people with chronic kidney disease. This medicine may be used for other purposes; ask your health care provider or pharmacist if you have questions. COMMON BRAND NAME(S): Feraheme What should I tell my health care provider before I take this medicine? They need to know if you  have any of these conditions: -anemia not caused by low iron levels -high levels of iron in the blood -magnetic resonance imaging (MRI) test scheduled -an unusual or allergic reaction to iron, other medicines, foods, dyes, or preservatives -pregnant or trying to get pregnant -breast-feeding How should I use this medicine? This medicine is for injection into a vein. It is given by a health care professional in a hospital or clinic setting. Talk to your pediatrician regarding the use of this medicine in children. Special care may be needed. Overdosage: If you think you have taken too much of this medicine contact a poison control center or emergency room at once. NOTE: This medicine is only for you. Do not share this medicine with others. What if I miss a dose? It is important not to miss your dose. Call your doctor or health care professional if you are unable to keep an appointment. What may interact with this medicine? This medicine may interact with the following medications: -other iron products This list may not describe all possible interactions. Give your health care provider a list of all the medicines, herbs, non-prescription drugs, or dietary supplements you use. Also tell them if you smoke, drink alcohol, or use illegal drugs. Some items may interact with your medicine. What should I watch for while using this medicine? Visit your doctor or healthcare professional regularly. Tell your doctor or healthcare professional if your symptoms do not start to get better or if they get worse. You may need blood work done while you are taking this medicine. You may need to follow a special diet. Talk to your doctor. Foods that contain iron include: whole grains/cereals, dried fruits, beans, or peas, leafy green vegetables, and organ meats (liver, kidney). What side effects may I notice from receiving this medicine? Side effects that you should report to your doctor or health care professional as  soon as possible: -allergic reactions like skin rash, itching or hives, swelling of the face, lips, or tongue -breathing problems -changes in blood pressure -feeling faint or lightheaded, falls -fever or chills -flushing, sweating, or hot feelings -swelling of the ankles or feet Side effects that usually do not require medical attention (report to your doctor or health care professional if they continue or are bothersome): -diarrhea -headache -nausea, vomiting -stomach pain This list may not describe all possible side effects. Call your doctor for medical advice about side effects. You may report side effects to FDA at 1-800-FDA-1088. Where should I keep my medicine? This drug is given in a hospital or clinic and will not be stored at home. NOTE: This sheet is a summary. It may not cover all possible information. If you have questions about this medicine, talk to your doctor, pharmacist, or health care provider.  2018 Elsevier/Gold Standard (2015-07-16 12:41:49)

## 2017-07-17 NOTE — Telephone Encounter (Signed)
Patient called in she can not come in earlier but she will try

## 2017-07-18 LAB — FERRITIN: Ferritin: 294 ng/mL — ABNORMAL HIGH (ref 9–269)

## 2017-07-21 ENCOUNTER — Ambulatory Visit: Payer: Medicare Other | Admitting: Cardiovascular Disease

## 2017-08-07 ENCOUNTER — Encounter: Payer: Self-pay | Admitting: Physician Assistant

## 2017-08-07 ENCOUNTER — Ambulatory Visit (INDEPENDENT_AMBULATORY_CARE_PROVIDER_SITE_OTHER): Payer: Medicare Other | Admitting: Physician Assistant

## 2017-08-07 ENCOUNTER — Other Ambulatory Visit: Payer: Self-pay | Admitting: Physician Assistant

## 2017-08-07 VITALS — BP 140/76 | HR 55 | Ht 61.0 in | Wt 247.0 lb

## 2017-08-07 DIAGNOSIS — I10 Essential (primary) hypertension: Secondary | ICD-10-CM | POA: Diagnosis not present

## 2017-08-07 DIAGNOSIS — N183 Chronic kidney disease, stage 3 unspecified: Secondary | ICD-10-CM

## 2017-08-07 DIAGNOSIS — E876 Hypokalemia: Secondary | ICD-10-CM | POA: Diagnosis not present

## 2017-08-07 DIAGNOSIS — I480 Paroxysmal atrial fibrillation: Secondary | ICD-10-CM | POA: Diagnosis not present

## 2017-08-07 MED ORDER — LOSARTAN POTASSIUM 50 MG PO TABS: 50.0000 mg | ORAL_TABLET | Freq: Every day | ORAL | 3 refills | Status: DC

## 2017-08-07 MED ORDER — DILTIAZEM HCL ER COATED BEADS 120 MG PO CP24: 120.0000 mg | ORAL_CAPSULE | Freq: Every day | ORAL | 3 refills | Status: DC

## 2017-08-07 NOTE — Progress Notes (Signed)
Cardiology Office Note   Date:  08/07/2017   ID:  Peggy Armstrong, DOB 14-Apr-1946, MRN [id number]  PCP:  Peggy Coyer, MD  Cardiologist: Dr. Sallyanne Armstrong, 03/29/2017 Peggy Ferries, PA-C 04/24/2017  Chief Complaint  Patient presents with  . Follow-up    History of Present Illness: Peggy Armstrong is a 72 y.o. female with a history of DVT on Eliquis, RA, HTN, persistent A. Fib dx 03/2017, patient started on amiodarone and was in sinus rhythm at follow-up, started on Eliquis, grade 2 diastolic dysfunction on echo  04/24/2017 office visit, patient seemed a bit overwhelmed so was not given an event monitor, continue amiodarone, continue Eliquis, Cardizem decreased from 300 mg daily to 240 mg daily, do daily weights and continue chlorthalidone  Peggy Armstrong presents for cardiology follow up.  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 She is anemic, followed by Dr Peggy Armstrong.  Her breathing has been good, she was told she was wheezing last week, got better after a glass of water. Her throat was sore after the last procedure.   Her K+ has been low, oral and dietary supplement recommended.   She does not get chest pain.   She is trying to eat better.   She feels tired quite a bit, feels she can sleep all day.  She is taking hydrocodone prn, does not take > 1 per day.  Her activity level is getting a little better, she gets tired easily.  She still has some DOE, chronic. No LE edema, no orthopnea or PND. She sleeps on 2 pillows since her GI issues started.    Past Medical History:  Diagnosis Date  . Clotting disorder ([place])    right DVT  . GERD (gastroesophageal reflux disease)   . Gout   . Gout   . Hypertension   . RA (rheumatoid arthritis) ([place])     Past Surgical History:  Procedure Laterality Date  . ABDOMINAL HYSTERECTOMY    .  CHOLECYSTECTOMY    . ENDOSCOPIC RETROGRADE CHOLANGIOPANCREATOGRAPHY (ERCP) WITH PROPOFOL N/A 06/15/2017   Procedure: ENDOSCOPIC RETROGRADE CHOLANGIOPANCREATOGRAPHY (ERCP) WITH PROPOFOL;  Surgeon: [name], MD;  Location: [facility];  Service: Endoscopy;  Laterality: N/A;  . ESOPHAGOGASTRODUODENOSCOPY (EGD) WITH PROPOFOL N/A 03/24/2017   Procedure: ESOPHAGOGASTRODUODENOSCOPY (EGD) WITH PROPOFOL;  Surgeon: Doran Stabler, MD;  Location: [facility];  Service: Gastroenterology;  Laterality: N/A;  . EUS N/A 04/06/2017   Procedure: UPPER ENDOSCOPIC ULTRASOUND (EUS) LINEAR;  Surgeon: [name], MD;  Location: [facility];  Service: Endoscopy;  Laterality: N/A;  to evaluate duodenal mass  . HERNIA REPAIR    . IR BILIARY DRAIN PLACEMENT WITH CHOLANGIOGRAM  03/25/2017  . IR CONVERT BILIARY DRAIN TO INT EXT BILIARY DRAIN  04/03/2017    Current Outpatient Medications  Medication Sig Dispense Refill  . allopurinol (ZYLOPRIM) 300 MG tablet Take 300 mg by mouth daily.     Marland Kitchen ALPRAZolam (XANAX) 0.25 MG tablet Take 0.25 mg by mouth at bedtime.   2  . amiodarone (PACERONE) 200 MG tablet One tablet daily 90 tablet 0  . apixaban (ELIQUIS) 5 MG TABS tablet Take 1 tablet (5 mg total) by mouth 2 (two) times daily. Stop taking this medication on October 9th, 2018. 60 tablet 0  . chlorthalidone (HYGROTON) 25 MG tablet Take 25 mg by mouth daily.    . cholecalciferol (VITAMIN D) 1000  units tablet Take 1,000 Units by mouth daily.    Marland Kitchen diltiazem (CARDIZEM CD) 240 MG 24 hr capsule Take 1 capsule (240 mg total) by mouth daily. 30 capsule 5  . losartan (COZAAR) 25 MG tablet Take 25 mg by mouth daily.    Marland Kitchen oxyCODONE (OXY IR/ROXICODONE) 5 MG immediate release tablet Take 1 tablet (5 mg total) by mouth every 4 (four) hours as needed for severe pain. 30 tablet 0  . pantoprazole (PROTONIX) 40 MG tablet Take 1 tablet (40 mg total) by mouth daily. 30 tablet 1  . potassium chloride (K-DUR,KLOR-CON) 10 MEQ tablet  Take 10 mEq by mouth daily.    . sertraline (ZOLOFT) 25 MG tablet Take by mouth.    . vitamin B-12 (CYANOCOBALAMIN) 1000 MCG tablet Take 1,000 mcg by mouth daily.     No current facility-administered medications for this visit.     Allergies:   Codeine    Social History:  The patient  reports that  has never smoked. she has never used smokeless tobacco. She reports that she does not drink alcohol or use drugs.   Family History:  The patient's family history includes Cancer in her father.    ROS:  Please see the history of present illness. All other systems are reviewed and negative.    PHYSICAL EXAM: VS:  BP 140/76   Pulse (!) 55   Ht 5\' 1"  (1.549 m)   Wt 247 lb (112 kg)   SpO2 98%   BMI 46.67 kg/m  , BMI Body mass index is 46.67 kg/m. GEN: Well nourished, well developed, female in no acute distress  HEENT: normal for age  Neck: no JVD seen, difficult to assess 2nd body habitus, no carotid bruit, no masses Cardiac: RRR; no murmur, no rubs, or gallops Respiratory:  clear to auscultation bilaterally, normal work of breathing GI: soft, nontender, nondistended, + BS MS: no deformity or atrophy; no edema; distal pulses are 2+ in all 4 extremities   Skin: warm and dry, no rash Neuro:  Strength and sensation are intact Psych: euthymic mood, full affect   EKG:  EKG is not ordered today. The ekg ordered today demonstrates sinus bradycardia, HR 55, no acute ischemic changes. Minor morphology changes from 04/06/2017   Recent Labs: 03/26/2017: TSH 4.457 03/29/2017: Magnesium 1.8 05/25/2017: ALT 9 07/17/2017: BUN 22; Creatinine, Ser 1.52; Hemoglobin 11.0; Platelets 218; Potassium 3.4; Sodium 140    Lipid Panel No results found for: CHOL, TRIG, HDL, CHOLHDL, VLDL, LDLCALC, LDLDIRECT   Wt Readings from Last 3 Encounters:  08/07/17 247 lb (112 kg)  07/03/17 245 lb 9.6 oz (111.4 kg)  06/15/17 246 lb (111.6 kg)     Other studies Reviewed: Additional studies/ records that were  reviewed today include: office notes, hospital records and testing.  ASSESSMENT AND PLAN:  1.  PAF: Pt maintaining SR on amio and Diltiazem 240 mg qd.  However, she is describing fatigue and her resting heart rate is in the 50s.  I will decrease the diltiazem further to 180 mg a day to try and get her resting heart rate up and possibly help her fatigue.  2.  Hypertension: She is on losartan 25 mg, will increase this to 50 mg.  Continue chlorthalidone 25 mg daily.  If her renal function requires this to be stopped or the losartan to be stopped, would start amlodipine and hydralazine.  3.  CKD 3: Her GFR is in the 30s.  With the increase in Cozaar, she must  get labs done within 2 weeks.  She has an appointment on February 20 with Dr. Ardis Hughs.  She is asked to come by our office first and get the blood work done.   4.  Hypokalemia: She is on potassium 10 mEq daily.  She has trouble swallowing this.  I advised that it is okay to dissolve it in applesauce.  Her potassium was barely below normal the last time it was checked.  She is encouraged to continue to search out high potassium foods that she likes and eat them regularly.  Current medicines are reviewed at length with the patient today.  The patient does not have concerns regarding medicines.  The following changes have been made: Decrease diltiazem, increase Cozaar  Labs/ tests ordered today include:   Orders Placed This Encounter  Procedures  . Basic metabolic panel  . EKG 12-Lead     Disposition:   FU with Dr. Sallyanne Armstrong  Signed, Peggy Ferries, PA-C  08/07/2017 6:08 PM    [facility] Phone: [phone]; Fax: [phone]  This note was written with the assistance of speech recognition software. Please excuse any transcriptional errors.

## 2017-08-07 NOTE — Patient Instructions (Signed)
Medication Instructions:   DECREASE DILTIAZEM TO 120 MG ONCE DAILY  INCREASE LOSARTAN TO 50 MG ONCE DAILY= 2 OF THE 25 MG TABLETS ONCE DAILY  Labwork:  Your physician recommends that you return for lab work in: ONE WEEK  Follow-Up:  Your physician recommends that you schedule a follow-up appointment in: [place] A BLOOD PRESSURE MONITOR AND CHECK YOU BLOOD PRESSURE WHEN YOU FEEL BAD=CALL IF HEART RATE IS <55 OR >120

## 2017-08-15 LAB — BASIC METABOLIC PANEL
BUN / CREAT RATIO: 14 (ref 12–28)
BUN: 20 mg/dL (ref 8–27)
CHLORIDE: 102 mmol/L (ref 96–106)
CO2: 24 mmol/L (ref 20–29)
Calcium: 9.2 mg/dL (ref 8.7–10.3)
Creatinine, Ser: 1.38 mg/dL — ABNORMAL HIGH (ref 0.57–1.00)
GFR calc non Af Amer: 38 mL/min/{1.73_m2} — ABNORMAL LOW (ref >59)
GFR, EST AFRICAN AMERICAN: 44 mL/min/{1.73_m2} — AB (ref >59)
GLUCOSE: 85 mg/dL (ref 65–99)
Potassium: 3.3 mmol/L — ABNORMAL LOW (ref 3.5–5.2)
Sodium: 144 mmol/L (ref 134–144)

## 2017-08-16 ENCOUNTER — Ambulatory Visit: Payer: Medicare Other | Admitting: Gastroenterology

## 2017-09-06 ENCOUNTER — Telehealth: Payer: Self-pay

## 2017-09-06 DIAGNOSIS — N183 Chronic kidney disease, stage 3 unspecified: Secondary | ICD-10-CM

## 2017-09-06 NOTE — Telephone Encounter (Signed)
Attempted to contact patient x2. Detailed letter mailed to patient. Repeat lab slips enclosed in letter.

## 2017-09-06 NOTE — Telephone Encounter (Signed)
-----   Message from Sanda Klein, MD sent at 08/15/2017 12:01 PM EST ----- Kidney parameters are better and seem to be at baseline.  Potassium is again low.  Please confirm the dose of potassium that she is taking and have her double it (she is currently reportedly on 10 mEq, please have her take 28mEq and recheck in a month).

## 2017-09-19 ENCOUNTER — Other Ambulatory Visit: Payer: Self-pay

## 2017-09-19 DIAGNOSIS — I480 Paroxysmal atrial fibrillation: Secondary | ICD-10-CM

## 2017-09-19 MED ORDER — DILTIAZEM HCL ER COATED BEADS 120 MG PO CP24: 120.0000 mg | ORAL_CAPSULE | Freq: Every day | ORAL | 3 refills | Status: DC

## 2017-10-02 ENCOUNTER — Other Ambulatory Visit: Payer: Self-pay | Admitting: Cardiovascular Disease

## 2017-10-02 ENCOUNTER — Inpatient Hospital Stay: Payer: Medicare Other | Attending: Hematology and Oncology

## 2017-10-02 ENCOUNTER — Telehealth: Payer: Self-pay | Admitting: Hematology and Oncology

## 2017-10-02 ENCOUNTER — Inpatient Hospital Stay (HOSPITAL_BASED_OUTPATIENT_CLINIC_OR_DEPARTMENT_OTHER): Payer: Medicare Other | Admitting: Hematology and Oncology

## 2017-10-02 ENCOUNTER — Encounter: Payer: Self-pay | Admitting: Hematology and Oncology

## 2017-10-02 DIAGNOSIS — K449 Diaphragmatic hernia without obstruction or gangrene: Secondary | ICD-10-CM | POA: Diagnosis not present

## 2017-10-02 DIAGNOSIS — Z79899 Other long term (current) drug therapy: Secondary | ICD-10-CM | POA: Diagnosis not present

## 2017-10-02 DIAGNOSIS — N183 Chronic kidney disease, stage 3 unspecified: Secondary | ICD-10-CM

## 2017-10-02 DIAGNOSIS — D500 Iron deficiency anemia secondary to blood loss (chronic): Secondary | ICD-10-CM

## 2017-10-02 DIAGNOSIS — D509 Iron deficiency anemia, unspecified: Secondary | ICD-10-CM | POA: Diagnosis not present

## 2017-10-02 DIAGNOSIS — Z885 Allergy status to narcotic agent status: Secondary | ICD-10-CM

## 2017-10-02 DIAGNOSIS — C241 Malignant neoplasm of ampulla of Vater: Secondary | ICD-10-CM | POA: Insufficient documentation

## 2017-10-02 DIAGNOSIS — I82501 Chronic embolism and thrombosis of unspecified deep veins of right lower extremity: Secondary | ICD-10-CM

## 2017-10-02 DIAGNOSIS — M0579 Rheumatoid arthritis with rheumatoid factor of multiple sites without organ or systems involvement: Secondary | ICD-10-CM

## 2017-10-02 LAB — BASIC METABOLIC PANEL
ANION GAP: 11 (ref 3–11)
BUN: 17 mg/dL (ref 7–26)
CALCIUM: 9.5 mg/dL (ref 8.4–10.4)
CO2: 27 mmol/L (ref 22–29)
Chloride: 103 mmol/L (ref 98–109)
Creatinine, Ser: 1.33 mg/dL — ABNORMAL HIGH (ref 0.60–1.10)
GFR, EST AFRICAN AMERICAN: 45 mL/min — AB (ref >60)
GFR, EST NON AFRICAN AMERICAN: 39 mL/min — AB (ref >60)
GLUCOSE: 95 mg/dL (ref 70–140)
POTASSIUM: 3.7 mmol/L (ref 3.5–5.1)
SODIUM: 141 mmol/L (ref 136–145)

## 2017-10-02 LAB — CBC WITH DIFFERENTIAL/PLATELET
BASOS ABS: 0 10*3/uL (ref 0.0–0.1)
BASOS PCT: 0 %
Eosinophils Absolute: 0.2 10*3/uL (ref 0.0–0.5)
Eosinophils Relative: 3 %
HEMATOCRIT: 41.1 % (ref 34.8–46.6)
HEMOGLOBIN: 13.3 g/dL (ref 11.6–15.9)
Lymphocytes Relative: 15 %
Lymphs Abs: 1 10*3/uL (ref 0.9–3.3)
MCH: 28.6 pg (ref 25.1–34.0)
MCHC: 32.4 g/dL (ref 31.5–36.0)
MCV: 88.4 fL (ref 79.5–101.0)
MONOS PCT: 9 %
Monocytes Absolute: 0.6 10*3/uL (ref 0.1–0.9)
NEUTROS ABS: 4.9 10*3/uL (ref 1.5–6.5)
NEUTROS PCT: 73 %
Platelets: 179 10*3/uL (ref 145–400)
RBC: 4.65 MIL/uL (ref 3.70–5.45)
RDW: 17.3 % — ABNORMAL HIGH (ref 11.2–14.5)
WBC: 6.7 10*3/uL (ref 3.9–10.3)

## 2017-10-02 LAB — FERRITIN: FERRITIN: 65 ng/mL (ref 9–269)

## 2017-10-02 NOTE — Assessment & Plan Note (Signed)
She is undergoing close follow-up between [facility] and GI service locally Recent evaluation did not show recurrence of disease I would defer to GI service for further follow-up

## 2017-10-02 NOTE — Telephone Encounter (Signed)
Gave avs and calendar ° °

## 2017-10-02 NOTE — Assessment & Plan Note (Signed)
She has received numerous doses of intravenous iron recently Recheck iron studies and blood count today is within normal limits Plan to see her back in 3 months for further assessment and follow-up

## 2017-10-02 NOTE — Assessment & Plan Note (Signed)
This is stable.  She will continue medical management

## 2017-10-02 NOTE — Progress Notes (Signed)
[place] OFFICE PROGRESS NOTE  Patient Care Team: [name], [name], MD as PCP - General (Internal Medicine) Croitoru, Dani Gobble, MD as PCP - Cardiology (Cardiology) Valinda Party, MD as Consulting Physician (Rheumatology)  ASSESSMENT & PLAN:  Iron deficiency anemia She has received numerous doses of intravenous iron recently Recheck iron studies and blood count today is within normal limits Plan to see her back in 3 months for further assessment and follow-up  Cancer of ampulla of Vater [facility]) She is undergoing close follow-up between [facility] and GI service locally Recent evaluation did not show recurrence of disease I would defer to GI service for further follow-up  CKD (chronic kidney disease), stage III ([place]) This is stable.  She will continue medical management   No orders of the defined types were placed in this encounter.   INTERVAL HISTORY: Please see below for problem oriented charting. She returns for further follow-up Her recent evaluation at [facility] was negative for malignancy The patient denies any recent signs or symptoms of bleeding such as spontaneous epistaxis, hematuria or hematochezia.   SUMMARY OF ONCOLOGIC HISTORY:   Cancer of ampulla of Vater ([place])   03/22/2017 Imaging    1. Mild intra hepatic and moderate extrahepatic biliary dilatation, post cholecystectomy. Recommend correlation with laboratory values, follow-up MR or ERCP as indicated 2. Sigmoid colon diverticular disease without acute inflammation.       03/22/2017 - 03/30/2017 Hospital Admission    She was admitted for management of biliary obstruction      03/23/2017 Imaging    1. Mild to moderately motion degraded exam. 2. Biliary duct dilatation after cholecystectomy. No choledocholithiasis or well-defined obstructive mass. Given the elevated bilirubin, consider further evaluation with ERCP to exclude ampullary stricture or otherwise occult ampullary lesion. 3. Hiatal  hernia. 4. Iron deposition in the liver      03/24/2017 Procedure    She underwent EGD which revealed ulcerated mass in 2nd portion of duodenum      03/24/2017 Pathology Results    Duodenum, Biopsy, duodenum mass - ADENOMA. - NO HIGH GRADE DYSPLASIA OR MALIGNANCY      03/24/2017 Procedure    Status post percutaneous transhepatic cholangiogram with placement of a left approach externalized biliary drain.      03/29/2017 Imaging    CT chest 1. Small to moderate layering bilateral pleural effusions are new since 03/23/2017. Superimposed pulmonary atelectasis with no definite pneumonia. 2. Rounded but fluid density 3 cm area in the mediastinum anterior to the carina is favored to be pericardial fluid in a superior pericardial recess (normal variant). A follow-up Chest CT with IV contrast (such as in 3 months time) is recommended to document stability. 3. Partially visible upper abdomen transhepatic biliary drain with no adverse features. 4. Cardiomegaly.      04/03/2017 Procedure    Status post routine exchange of left hepatic approach external biliary drain for a new, modified, 10 [place] internal/ external drain.       04/06/2017 Pathology Results    Duodenum, Biopsy, peri ampullary mass - DUODENAL TUBULOVILLOUS ADENOMA. - NO HIGH GRADE DYSPLASIA OR INVASIVE CARCINOMA      04/06/2017 Procedure    EUS - 3.5cm soft, round ampullary neoplasm without evident deep invasion into the pancreas, duodenal wall. I repeated mucosal biopsies of the mass which previously showed adenoma without HGD. I also performed EUS FNA from deeper within the mass, await final cytology results. - Capped internal/external biliary drain is in good position extending  into the duodenum lumen from within the mass.      04/06/2017 Pathology Results    FINE NEEDLE ASPIRATION, ENDOSCOPIC PERI AMPULLARY MASS ( SPECIMEN 1 OF 1 COLLECTED 04/06/2017) ATYPICAL GLANDULAR CELLS, SEE COMMENT. Preliminary  Diagnosis Intraoperative Diagnosis: No overt malignant features ([facility])      05/02/2017 Pathology Results    Biopsy showed high grade dysplasia/intramucosal adenocarcinoma with areas suspicious for submucosal invasion      05/02/2017 Procedure    She underwent sphincterotomy, biopsy and biliary stent placement      06/15/2017 Procedure    - 1.5cm residual neoplastic periampullary lesion with adjacent previously placed endoclip. The lesion was sampled with forceps extensively.  - The previously placed platic biliary stent was removed with a snare and sent for cytology review. 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 - 4cm long, 57mm covered SEMS was placed across the stricture in good position        06/16/2017 Pathology Results    Duodenum, Biopsy, ampullary - DUODENAL TUBULOVILLOUS ADENOMA. - THERE IS NO EVIDENCE OF HIGH GRADE DYSPLASIA.       REVIEW OF SYSTEMS:   Constitutional: Denies fevers, chills or abnormal weight loss Eyes: Denies blurriness of vision Ears, nose, mouth, throat, and face: Denies mucositis or sore throat Respiratory: Denies cough, dyspnea or wheezes Cardiovascular: Denies palpitation, chest discomfort or lower extremity swelling Gastrointestinal:  Denies nausea, heartburn or change in bowel habits Skin: Denies abnormal skin rashes Lymphatics: Denies new lymphadenopathy or easy bruising Neurological:Denies numbness, tingling or new weaknesses Behavioral/Psych: Mood is stable, no new changes  All other systems were reviewed with the patient and are negative.  I have reviewed the past medical history, past surgical history, social history and family history with the patient and they are unchanged from previous note.  ALLERGIES:  is allergic to codeine.  MEDICATIONS:  Current Outpatient Medications  Medication Sig Dispense Refill  . allopurinol (ZYLOPRIM) 300 MG tablet Take 300 mg by mouth  daily.     Marland Kitchen ALPRAZolam (XANAX) 0.25 MG tablet Take 0.25 mg by mouth at bedtime.   2  . amiodarone (PACERONE) 200 MG tablet One tablet daily 90 tablet 0  . apixaban (ELIQUIS) 5 MG TABS tablet Take 1 tablet (5 mg total) by mouth 2 (two) times daily. Stop taking this medication on October 9th, 2018. 60 tablet 0  . chlorthalidone (HYGROTON) 25 MG tablet Take 25 mg by mouth daily.    . cholecalciferol (VITAMIN D) 1000 units tablet Take 1,000 Units by mouth daily.    Marland Kitchen diltiazem (CARDIZEM CD) 120 MG 24 hr capsule Take 1 capsule (120 mg total) by mouth daily. 90 capsule 3  . losartan (COZAAR) 50 MG tablet Take 1 tablet (50 mg total) by mouth daily. 90 tablet 3  . oxyCODONE (OXY IR/ROXICODONE) 5 MG immediate release tablet Take 1 tablet (5 mg total) by mouth every 4 (four) hours as needed for severe pain. 30 tablet 0  . pantoprazole (PROTONIX) 40 MG tablet Take 1 tablet (40 mg total) by mouth daily. 30 tablet 1  . potassium chloride (K-DUR,KLOR-CON) 10 MEQ tablet Take 10 mEq by mouth daily.    . sertraline (ZOLOFT) 25 MG tablet Take by mouth.    . vitamin B-12 (CYANOCOBALAMIN) 1000 MCG tablet Take 1,000 mcg by mouth daily.     No current facility-administered medications for this visit.     PHYSICAL EXAMINATION: ECOG PERFORMANCE STATUS: 1 - Symptomatic but completely ambulatory  Vitals:   10/02/17 1450  BP: (!) 159/97  Pulse: 65  Resp: 18  Temp: 98 F (36.7 C)  SpO2: 98%   Filed Weights   10/02/17 1450  Weight: 253 lb 1.6 oz (114.8 kg)    GENERAL:alert, no distress and comfortable SKIN: skin color, texture, turgor are normal, no rashes or significant lesions EYES: normal, Conjunctiva are pink and non-injected, sclera clear OROPHARYNX:no exudate, no erythema and lips, buccal mucosa, and tongue normal  NECK: supple, thyroid normal size, non-tender, without nodularity LYMPH:  no palpable lymphadenopathy in the cervical, axillary or inguinal LUNGS: clear to auscultation and percussion  with normal breathing effort HEART: regular rate & rhythm and no murmurs and no lower extremity edema ABDOMEN:abdomen soft, non-tender and normal bowel sounds Musculoskeletal:no cyanosis of digits and no clubbing  NEURO: alert & oriented x 3 with fluent speech, no focal motor/sensory deficits  LABORATORY DATA:  I have reviewed the data as listed    Component Value Date/Time   NA 141 10/02/2017 1414   NA 144 08/14/2017 1651   NA 142 08/11/2016 0936   K 3.7 10/02/2017 1414   K 3.4 (L) 08/11/2016 0936   CL 103 10/02/2017 1414   CO2 27 10/02/2017 1414   CO2 25 08/11/2016 0936   GLUCOSE 95 10/02/2017 1414   GLUCOSE 119 08/11/2016 0936   BUN 17 10/02/2017 1414   BUN 20 08/14/2017 1651   BUN 23.3 08/11/2016 0936   CREATININE 1.33 (H) 10/02/2017 1414   CREATININE 1.3 (H) 08/11/2016 0936   CALCIUM 9.5 10/02/2017 1414   CALCIUM 9.6 08/11/2016 0936   PROT 7.3 05/25/2017 1549   PROT 6.8 08/11/2016 0936   ALBUMIN 3.7 05/25/2017 1549   ALBUMIN 3.3 (L) 08/11/2016 0936   AST 10 05/25/2017 1549   AST 16 08/11/2016 0936   ALT 9 05/25/2017 1549   ALT 18 08/11/2016 0936   ALKPHOS 110 05/25/2017 1549   ALKPHOS 121 08/11/2016 0936   BILITOT 0.4 05/25/2017 1549   BILITOT 0.36 08/11/2016 0936   GFRNONAA 39 (L) 10/02/2017 1414   GFRAA 45 (L) 10/02/2017 1414    No results found for: SPEP, UPEP  Lab Results  Component Value Date   WBC 6.7 10/02/2017   NEUTROABS 4.9 10/02/2017   HGB 13.3 10/02/2017   HCT 41.1 10/02/2017   MCV 88.4 10/02/2017   PLT 179 10/02/2017      Chemistry      Component Value Date/Time   NA 141 10/02/2017 1414   NA 144 08/14/2017 1651   NA 142 08/11/2016 0936   K 3.7 10/02/2017 1414   K 3.4 (L) 08/11/2016 0936   CL 103 10/02/2017 1414   CO2 27 10/02/2017 1414   CO2 25 08/11/2016 0936   BUN 17 10/02/2017 1414   BUN 20 08/14/2017 1651   BUN 23.3 08/11/2016 0936   CREATININE 1.33 (H) 10/02/2017 1414   CREATININE 1.3 (H) 08/11/2016 0936      Component  Value Date/Time   CALCIUM 9.5 10/02/2017 1414   CALCIUM 9.6 08/11/2016 0936   ALKPHOS 110 05/25/2017 1549   ALKPHOS 121 08/11/2016 0936   AST 10 05/25/2017 1549   AST 16 08/11/2016 0936   ALT 9 05/25/2017 1549   ALT 18 08/11/2016 0936   BILITOT 0.4 05/25/2017 1549   BILITOT 0.36 08/11/2016 0936       All questions were answered. The patient knows to call the clinic with any problems, questions or concerns. No barriers to learning was detected.  I spent 10 minutes counseling the patient face to face. The total time spent in the appointment was 15 minutes and more than 50% was on counseling and review of test results  Heath Lark, MD 10/02/2017 4:15 PM

## 2017-10-03 NOTE — Telephone Encounter (Signed)
REFILL 

## 2017-10-16 ENCOUNTER — Other Ambulatory Visit: Payer: Self-pay | Admitting: Physician Assistant

## 2017-10-16 NOTE — Telephone Encounter (Signed)
Rx request sent to pharmacy.  

## 2017-10-23 ENCOUNTER — Ambulatory Visit: Payer: Medicare Other | Admitting: Gastroenterology

## 2017-11-04 ENCOUNTER — Encounter (HOSPITAL_COMMUNITY): Payer: Self-pay | Admitting: Emergency Medicine

## 2017-11-04 ENCOUNTER — Other Ambulatory Visit: Payer: Self-pay

## 2017-11-04 ENCOUNTER — Inpatient Hospital Stay (HOSPITAL_COMMUNITY)
Admission: EM | Admit: 2017-11-04 | Discharge: 2017-11-09 | DRG: 919 | Disposition: A | Discharge status: Home / Self Care | Payer: Medicare Other | Attending: Internal Medicine | Admitting: Internal Medicine

## 2017-11-04 DIAGNOSIS — F419 Anxiety disorder, unspecified: Secondary | ICD-10-CM | POA: Diagnosis present

## 2017-11-04 DIAGNOSIS — I82401 Acute embolism and thrombosis of unspecified deep veins of right lower extremity: Secondary | ICD-10-CM | POA: Diagnosis present

## 2017-11-04 DIAGNOSIS — E861 Hypovolemia: Secondary | ICD-10-CM | POA: Diagnosis present

## 2017-11-04 DIAGNOSIS — K219 Gastro-esophageal reflux disease without esophagitis: Secondary | ICD-10-CM | POA: Diagnosis present

## 2017-11-04 DIAGNOSIS — Z90710 Acquired absence of both cervix and uterus: Secondary | ICD-10-CM

## 2017-11-04 DIAGNOSIS — E876 Hypokalemia: Secondary | ICD-10-CM | POA: Diagnosis present

## 2017-11-04 DIAGNOSIS — Y838 Other surgical procedures as the cause of abnormal reaction of the patient, or of later complication, without mention of misadventure at the time of the procedure: Secondary | ICD-10-CM | POA: Diagnosis present

## 2017-11-04 DIAGNOSIS — K449 Diaphragmatic hernia without obstruction or gangrene: Secondary | ICD-10-CM | POA: Diagnosis present

## 2017-11-04 DIAGNOSIS — F329 Major depressive disorder, single episode, unspecified: Secondary | ICD-10-CM | POA: Diagnosis present

## 2017-11-04 DIAGNOSIS — D539 Nutritional anemia, unspecified: Secondary | ICD-10-CM | POA: Diagnosis present

## 2017-11-04 DIAGNOSIS — K91840 Postprocedural hemorrhage and hematoma of a digestive system organ or structure following a digestive system procedure: Principal | ICD-10-CM | POA: Diagnosis present

## 2017-11-04 DIAGNOSIS — N183 Chronic kidney disease, stage 3 unspecified: Secondary | ICD-10-CM | POA: Diagnosis present

## 2017-11-04 DIAGNOSIS — K269 Duodenal ulcer, unspecified as acute or chronic, without hemorrhage or perforation: Secondary | ICD-10-CM

## 2017-11-04 DIAGNOSIS — K264 Chronic or unspecified duodenal ulcer with hemorrhage: Secondary | ICD-10-CM | POA: Diagnosis present

## 2017-11-04 DIAGNOSIS — Z79899 Other long term (current) drug therapy: Secondary | ICD-10-CM

## 2017-11-04 DIAGNOSIS — C241 Malignant neoplasm of ampulla of Vater: Secondary | ICD-10-CM | POA: Diagnosis present

## 2017-11-04 DIAGNOSIS — D62 Acute posthemorrhagic anemia: Secondary | ICD-10-CM | POA: Diagnosis present

## 2017-11-04 DIAGNOSIS — M109 Gout, unspecified: Secondary | ICD-10-CM | POA: Diagnosis present

## 2017-11-04 DIAGNOSIS — K921 Melena: Secondary | ICD-10-CM | POA: Diagnosis not present

## 2017-11-04 DIAGNOSIS — I129 Hypertensive chronic kidney disease with stage 1 through stage 4 chronic kidney disease, or unspecified chronic kidney disease: Secondary | ICD-10-CM | POA: Diagnosis present

## 2017-11-04 DIAGNOSIS — Z6841 Body Mass Index (BMI) 40.0 and over, adult: Secondary | ICD-10-CM

## 2017-11-04 DIAGNOSIS — Z885 Allergy status to narcotic agent status: Secondary | ICD-10-CM

## 2017-11-04 DIAGNOSIS — I951 Orthostatic hypotension: Secondary | ICD-10-CM | POA: Diagnosis present

## 2017-11-04 DIAGNOSIS — R112 Nausea with vomiting, unspecified: Secondary | ICD-10-CM

## 2017-11-04 DIAGNOSIS — M069 Rheumatoid arthritis, unspecified: Secondary | ICD-10-CM | POA: Diagnosis present

## 2017-11-04 DIAGNOSIS — Z7901 Long term (current) use of anticoagulants: Secondary | ICD-10-CM

## 2017-11-04 DIAGNOSIS — Z86718 Personal history of other venous thrombosis and embolism: Secondary | ICD-10-CM

## 2017-11-04 DIAGNOSIS — I484 Atypical atrial flutter: Secondary | ICD-10-CM | POA: Diagnosis present

## 2017-11-04 DIAGNOSIS — D649 Anemia, unspecified: Secondary | ICD-10-CM | POA: Diagnosis present

## 2017-11-04 DIAGNOSIS — K920 Hematemesis: Secondary | ICD-10-CM | POA: Diagnosis present

## 2017-11-04 DIAGNOSIS — D509 Iron deficiency anemia, unspecified: Secondary | ICD-10-CM | POA: Diagnosis present

## 2017-11-04 HISTORY — DX: Acute embolism and thrombosis of unspecified deep veins of unspecified lower extremity: I82.409

## 2017-11-04 LAB — CBC
HCT: 34.9 % — ABNORMAL LOW (ref 36.0–46.0)
HEMOGLOBIN: 11.1 g/dL — AB (ref 12.0–15.0)
MCH: 28.4 pg (ref 26.0–34.0)
MCHC: 31.8 g/dL (ref 30.0–36.0)
MCV: 89.3 fL (ref 78.0–100.0)
PLATELETS: 220 10*3/uL (ref 150–400)
RBC: 3.91 MIL/uL (ref 3.87–5.11)
RDW: 15.7 % — ABNORMAL HIGH (ref 11.5–15.5)
WBC: 6.8 10*3/uL (ref 4.0–10.5)

## 2017-11-04 LAB — COMPREHENSIVE METABOLIC PANEL
ALT: 14 U/L (ref 14–54)
ANION GAP: 12 (ref 5–15)
AST: 16 U/L (ref 15–41)
Albumin: 3.4 g/dL — ABNORMAL LOW (ref 3.5–5.0)
Alkaline Phosphatase: 94 U/L (ref 38–126)
BUN: 33 mg/dL — ABNORMAL HIGH (ref 6–20)
CHLORIDE: 103 mmol/L (ref 101–111)
CO2: 28 mmol/L (ref 22–32)
Calcium: 8.6 mg/dL — ABNORMAL LOW (ref 8.9–10.3)
Creatinine, Ser: 1.25 mg/dL — ABNORMAL HIGH (ref 0.44–1.00)
GFR, EST AFRICAN AMERICAN: 49 mL/min — AB (ref >60)
GFR, EST NON AFRICAN AMERICAN: 42 mL/min — AB (ref >60)
Glucose, Bld: 110 mg/dL — ABNORMAL HIGH (ref 65–99)
Potassium: 3.7 mmol/L (ref 3.5–5.1)
SODIUM: 143 mmol/L (ref 135–145)
Total Bilirubin: 0.5 mg/dL (ref 0.3–1.2)
Total Protein: 6.8 g/dL (ref 6.5–8.1)

## 2017-11-04 LAB — POC OCCULT BLOOD, ED: FECAL OCCULT BLD: POSITIVE — AB

## 2017-11-04 LAB — PROTIME-INR
INR: 1.1
Prothrombin Time: 14.2 seconds (ref 11.4–15.2)

## 2017-11-04 NOTE — ED Provider Notes (Addendum)
[place] DEPT Provider Note  CSN: [id number] Arrival date & time: 11/04/17 2209  Chief Complaint(s) Hematemesis and Post-op Problem  HPI Peggy Armstrong is a 72 y.o. female  extensive past medical history listed below including DVT on Eliquis, mass in the  ampulla [place] that was biopsied yesterday by [facility], presents to the emergency department with one episode of hematemesis and episode of melenic stool. Last episode was this am, but states that she has been leaking small amount of stools.  Patient contacted [facility] who recommended she present to the nearest emergency department.  She reports that she is been off her Eliquis for several days in anticipation for the biopsy.  Denies any lightheadedness, headache, current nausea, chest pain, shortness of breath, abdominal pain, urinary symptoms.   The history is provided by the patient and a relative.    Past Medical History Past Medical History:  Diagnosis Date  . DVT (deep venous thrombosis) (HCC)    right DVT  . GERD (gastroesophageal reflux disease)   . Gout   . Gout   . Hypertension   . RA (rheumatoid arthritis) [facility])    Patient Active Problem List   Diagnosis Date Noted  . Hematemesis 11/05/2017  . Hypokalemia due to inadequate potassium intake 07/03/2017  . Hypertension   . Gout   . Clotting disorder ([place])   . GERD (gastroesophageal reflux disease)   . 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 CKD (chronic kidney disease), stage III ([place]) 06/09/2017  . Peri-ampullary neoplasm   . 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 Duodenal mass   . Cholangitis   . Epigastric pain   . Choledocholithiasis 03/22/2017  . SIRS (systemic inflammatory response syndrome) ([place]) 03/22/2017  . Poor dentition 08/14/2016  . Iron deficiency anemia 08/02/2016  . Right leg DVT ([place]) 09/07/2015  . RA (rheumatoid arthritis) ([place]) 09/07/2015   Home Medication(s) Prior to Admission medications   Medication Sig  Start Date End Date Taking? Authorizing Provider  allopurinol (ZYLOPRIM) 300 MG tablet Take 300 mg by mouth daily.  05/06/15  Yes [provider]  ALPRAZolam (XANAX) 0.25 MG tablet Take 0.25 mg by mouth at bedtime.  07/21/15  Yes [provider]  amiodarone (PACERONE) 200 MG tablet TAKE 1 TABLET BY MOUTH EVERY DAY 10/03/17  Yes Croitoru, Mihai, MD  apixaban (ELIQUIS) 5 MG TABS tablet Take 1 tablet (5 mg total) by mouth 2 (two) times daily. Stop taking this medication on October 9th, 2018. 03/30/17  Yes Theodis Blaze, MD  chlorthalidone (HYGROTON) 25 MG tablet Take 25 mg by mouth daily. 07/22/15  Yes [provider]  cholecalciferol (VITAMIN D) 1000 units tablet Take 1,000 Units by mouth daily.   Yes [provider]  diltiazem (CARDIZEM CD) 120 MG 24 hr capsule Take 1 capsule (120 mg total) by mouth daily. 09/19/17  Yes Croitoru, Mihai, MD  furosemide (LASIX) 20 MG tablet Take 20 mg by mouth 2 (two) times daily.   Yes [provider]  losartan (COZAAR) 50 MG tablet Take 1 tablet (50 mg total) by mouth daily. 08/07/17  Yes Croitoru, Mihai, MD  pantoprazole (PROTONIX) 40 MG tablet Take 1 tablet (40 mg total) by mouth daily. 03/31/17  Yes Theodis Blaze, MD  potassium chloride (K-DUR,KLOR-CON) 10 MEQ tablet Take 20 mEq by mouth daily.    Yes [provider]  PROAIR HFA 108 (90 Base) MCG/ACT inhaler Inhale 2 puffs into the lungs every 6 (six)  hours as needed for wheezing or shortness of breath.  10/24/17  Yes [provider]  sertraline (ZOLOFT) 25 MG tablet Take 25 mg by mouth daily.  07/07/17  Yes [provider]  vitamin B-12 (CYANOCOBALAMIN) 1000 MCG tablet Take 1,000 mcg by mouth daily.   Yes [provider]  diltiazem (CARDIZEM CD) 240 MG 24 hr capsule TAKE 1 CAPSULE BY MOUTH EVERY DAY Patient not taking: Reported on 11/05/2017 10/16/17   Croitoru, Mihai, MD  oxyCODONE (OXY IR/ROXICODONE) 5 MG immediate release tablet Take 1  tablet (5 mg total) by mouth every 4 (four) hours as needed for severe pain. Patient not taking: Reported on 11/05/2017 07/03/17   Heath Lark, MD                                                                                                                                    Past Surgical History Past Surgical History:  Procedure Laterality Date  . ABDOMINAL HYSTERECTOMY    . CHOLECYSTECTOMY    . ENDOSCOPIC RETROGRADE CHOLANGIOPANCREATOGRAPHY (ERCP) WITH PROPOFOL N/A 06/15/2017   Procedure: ENDOSCOPIC RETROGRADE CHOLANGIOPANCREATOGRAPHY (ERCP) WITH PROPOFOL;  Surgeon: [name], MD;  Location: [facility] ENDOSCOPY;  Service: Endoscopy;  Laterality: N/A;  . ESOPHAGOGASTRODUODENOSCOPY (EGD) WITH PROPOFOL N/A 03/24/2017   Procedure: ESOPHAGOGASTRODUODENOSCOPY (EGD) WITH PROPOFOL;  Surgeon: Doran Stabler, MD;  Location: [facility] ENDOSCOPY;  Service: Gastroenterology;  Laterality: N/A;  . EUS N/A 04/06/2017   Procedure: UPPER ENDOSCOPIC ULTRASOUND (EUS) LINEAR;  Surgeon: [name], MD;  Location: [facility] ENDOSCOPY;  Service: Endoscopy;  Laterality: N/A;  to evaluate duodenal mass  . HERNIA REPAIR    . IR BILIARY DRAIN PLACEMENT WITH CHOLANGIOGRAM  03/25/2017  . IR CONVERT BILIARY DRAIN TO INT EXT BILIARY DRAIN  04/03/2017   Family History Family History  Problem Relation Age of Onset  . Cancer Father        prostate ca    Social History Social History   Tobacco Use  . Smoking status: Never Smoker  . Smokeless tobacco: Never Used  Substance Use Topics  . Alcohol use: No  . Drug use: No   Allergies Codeine  Review of Systems Review of Systems All other systems are reviewed and are negative for acute change except as noted in the HPI  Physical Exam Vital Signs  I have reviewed the triage vital signs BP (!) 141/104 (BP Location: Right Arm)   Pulse 71   Temp 97.9 F (36.6 C) (Oral)   Resp 16   Ht 5\' 1"  (1.549 m)   Wt 108.9 kg (240 lb)   SpO2 97%   BMI 45.35 kg/m   Physical  Exam  Constitutional: She is oriented to person, place, and time. She appears well-developed and well-nourished. No distress.  HENT:  Head: Normocephalic and atraumatic.  Nose: Nose normal.  Eyes: Pupils are equal, round, and reactive to light. Conjunctivae and EOM are normal.  Right eye exhibits no discharge. Left eye exhibits no discharge. No scleral icterus.  Neck: Normal range of motion. Neck supple.  Cardiovascular: Normal rate and regular rhythm. Exam reveals no gallop and no friction rub.  No murmur heard. Pulmonary/Chest: Effort normal and breath sounds normal. No stridor. No respiratory distress. She has no rales.  Abdominal: Soft. She exhibits no distension. There is no tenderness.  Musculoskeletal: She exhibits no edema or tenderness.  Neurological: She is alert and oriented to person, place, and time.  Skin: Skin is warm and dry. No rash noted. She is not diaphoretic. No erythema.  Psychiatric: She has a normal mood and affect.  Vitals reviewed.   ED Results and Treatments Labs (all labs ordered are listed, but only abnormal results are displayed) Labs Reviewed  COMPREHENSIVE METABOLIC PANEL - Abnormal; Notable for the following components:      Result Value   Glucose, Bld 110 (*)    BUN 33 (*)    Creatinine, Ser 1.25 (*)    Calcium 8.6 (*)    Albumin 3.4 (*)    GFR calc non Af Amer 42 (*)    GFR calc Af Amer 49 (*)    All other components within normal limits  CBC - Abnormal; Notable for the following components:   Hemoglobin 11.1 (*)    HCT 34.9 (*)    RDW 15.7 (*)    All other components within normal limits  POC OCCULT BLOOD, ED - Abnormal; Notable for the following components:   Fecal Occult Bld POSITIVE (*)    All other components within normal limits  PROTIME-INR  CBC  COMPREHENSIVE METABOLIC PANEL  TYPE AND SCREEN                                                                                                                         EKG  EKG  Interpretation  Date/Time:    Ventricular Rate:    PR Interval:    QRS Duration:   QT Interval:    QTC Calculation:   R Axis:     Text Interpretation:        Radiology No results found. Pertinent labs & imaging results that were available during my care of the patient were reviewed by me and considered in my medical decision making (see chart for details).  Medications Ordered in ED Medications - No data to display  Procedures Procedures  (including critical care time)  Medical Decision Making / ED Course I have reviewed the nursing notes for this encounter and the patient's prior records (if available in EHR or on provided paperwork).    Patient here with melenic stools status post EGD for tumor biopsy.  Hemoccult positive.  Hemoglobin with a 2 g drop from 1 month ago.   Patient is hemodynamically stable.  Asymptomatic.  No active bleeding at this time but will require admission for hemoglobin trending. No need for transfusion at this time.  Will discuss with medicine for admission.  GI consult paged per medicine request. No response after 2 pages.  Final Clinical Impression(s) / ED Diagnoses Final diagnoses:  Melena      This chart was dictated using voice recognition software.  Despite best efforts to proofread,  errors can occur which can change the documentation meaning.   Fatima Blank, MD 11/04/17 2344    Fatima Blank, MD 11/05/17 [phone]

## 2017-11-04 NOTE — ED Notes (Signed)
Pt with near syncopal episode. Patient pale and clammy and unable to respond. Charge nurse and EDP made aware. EDPA Mia at bedside

## 2017-11-04 NOTE — ED Triage Notes (Signed)
Pt comes in with complaints of blood in stool and vomit s/p endoscopy yesterday 5/10. Patient stated her bowel movements started out normal and then began to turn black. Patient stated she vomited one time today and it was brown but no coffee ground emesis. Patient does endorse melena. Patient has no complaints of abdominal pain.

## 2017-11-05 ENCOUNTER — Encounter (HOSPITAL_COMMUNITY): Payer: Self-pay | Admitting: Internal Medicine

## 2017-11-05 ENCOUNTER — Inpatient Hospital Stay (HOSPITAL_COMMUNITY): Payer: Medicare Other

## 2017-11-05 DIAGNOSIS — K449 Diaphragmatic hernia without obstruction or gangrene: Secondary | ICD-10-CM | POA: Diagnosis present

## 2017-11-05 DIAGNOSIS — K920 Hematemesis: Secondary | ICD-10-CM | POA: Diagnosis present

## 2017-11-05 DIAGNOSIS — K264 Chronic or unspecified duodenal ulcer with hemorrhage: Secondary | ICD-10-CM | POA: Diagnosis present

## 2017-11-05 DIAGNOSIS — K269 Duodenal ulcer, unspecified as acute or chronic, without hemorrhage or perforation: Secondary | ICD-10-CM | POA: Diagnosis not present

## 2017-11-05 DIAGNOSIS — D509 Iron deficiency anemia, unspecified: Secondary | ICD-10-CM | POA: Diagnosis present

## 2017-11-05 DIAGNOSIS — Z90710 Acquired absence of both cervix and uterus: Secondary | ICD-10-CM | POA: Diagnosis not present

## 2017-11-05 DIAGNOSIS — D62 Acute posthemorrhagic anemia: Secondary | ICD-10-CM | POA: Diagnosis present

## 2017-11-05 DIAGNOSIS — K921 Melena: Secondary | ICD-10-CM | POA: Diagnosis not present

## 2017-11-05 DIAGNOSIS — I82401 Acute embolism and thrombosis of unspecified deep veins of right lower extremity: Secondary | ICD-10-CM | POA: Diagnosis present

## 2017-11-05 DIAGNOSIS — E876 Hypokalemia: Secondary | ICD-10-CM | POA: Diagnosis present

## 2017-11-05 DIAGNOSIS — Z86718 Personal history of other venous thrombosis and embolism: Secondary | ICD-10-CM | POA: Diagnosis not present

## 2017-11-05 DIAGNOSIS — Z6841 Body Mass Index (BMI) 40.0 and over, adult: Secondary | ICD-10-CM | POA: Diagnosis not present

## 2017-11-05 DIAGNOSIS — E861 Hypovolemia: Secondary | ICD-10-CM | POA: Diagnosis present

## 2017-11-05 DIAGNOSIS — D539 Nutritional anemia, unspecified: Secondary | ICD-10-CM

## 2017-11-05 DIAGNOSIS — K219 Gastro-esophageal reflux disease without esophagitis: Secondary | ICD-10-CM | POA: Diagnosis present

## 2017-11-05 DIAGNOSIS — K91840 Postprocedural hemorrhage and hematoma of a digestive system organ or structure following a digestive system procedure: Secondary | ICD-10-CM | POA: Diagnosis present

## 2017-11-05 DIAGNOSIS — D649 Anemia, unspecified: Secondary | ICD-10-CM | POA: Diagnosis present

## 2017-11-05 DIAGNOSIS — R112 Nausea with vomiting, unspecified: Secondary | ICD-10-CM | POA: Diagnosis not present

## 2017-11-05 DIAGNOSIS — Z885 Allergy status to narcotic agent status: Secondary | ICD-10-CM | POA: Diagnosis not present

## 2017-11-05 DIAGNOSIS — K922 Gastrointestinal hemorrhage, unspecified: Secondary | ICD-10-CM | POA: Diagnosis not present

## 2017-11-05 DIAGNOSIS — I484 Atypical atrial flutter: Secondary | ICD-10-CM | POA: Diagnosis present

## 2017-11-05 DIAGNOSIS — M069 Rheumatoid arthritis, unspecified: Secondary | ICD-10-CM | POA: Diagnosis present

## 2017-11-05 DIAGNOSIS — I129 Hypertensive chronic kidney disease with stage 1 through stage 4 chronic kidney disease, or unspecified chronic kidney disease: Secondary | ICD-10-CM | POA: Diagnosis present

## 2017-11-05 DIAGNOSIS — F329 Major depressive disorder, single episode, unspecified: Secondary | ICD-10-CM | POA: Diagnosis present

## 2017-11-05 DIAGNOSIS — Y838 Other surgical procedures as the cause of abnormal reaction of the patient, or of later complication, without mention of misadventure at the time of the procedure: Secondary | ICD-10-CM | POA: Diagnosis present

## 2017-11-05 DIAGNOSIS — M109 Gout, unspecified: Secondary | ICD-10-CM | POA: Diagnosis present

## 2017-11-05 DIAGNOSIS — N183 Chronic kidney disease, stage 3 (moderate): Secondary | ICD-10-CM | POA: Diagnosis present

## 2017-11-05 DIAGNOSIS — C241 Malignant neoplasm of ampulla of Vater: Secondary | ICD-10-CM | POA: Diagnosis present

## 2017-11-05 DIAGNOSIS — F419 Anxiety disorder, unspecified: Secondary | ICD-10-CM | POA: Diagnosis present

## 2017-11-05 DIAGNOSIS — I951 Orthostatic hypotension: Secondary | ICD-10-CM | POA: Diagnosis present

## 2017-11-05 HISTORY — DX: Nutritional anemia, unspecified: D53.9

## 2017-11-05 HISTORY — DX: Hematemesis: K92.0

## 2017-11-05 HISTORY — DX: Melena: K92.1

## 2017-11-05 LAB — CBC
HCT: 31.8 % — ABNORMAL LOW (ref 36.0–46.0)
HCT: 29.3 % — ABNORMAL LOW (ref 36.0–46.0)
HEMATOCRIT: 30.6 % — AB (ref 36.0–46.0)
HEMOGLOBIN: 9.4 g/dL — AB (ref 12.0–15.0)
Hemoglobin: 10.1 g/dL — ABNORMAL LOW (ref 12.0–15.0)
Hemoglobin: 9.7 g/dL — ABNORMAL LOW (ref 12.0–15.0)
MCH: 28.2 pg (ref 26.0–34.0)
MCH: 28.7 pg (ref 26.0–34.0)
MCH: 28.4 pg (ref 26.0–34.0)
MCHC: 31.8 g/dL (ref 30.0–36.0)
MCHC: 32.1 g/dL (ref 30.0–36.0)
MCHC: 31.7 g/dL (ref 30.0–36.0)
MCV: 88.8 fL (ref 78.0–100.0)
MCV: 89.6 fL (ref 78.0–100.0)
MCV: 89.5 fL (ref 78.0–100.0)
PLATELETS: 168 10*3/uL (ref 150–400)
PLATELETS: 159 10*3/uL (ref 150–400)
Platelets: 172 10*3/uL (ref 150–400)
RBC: 3.58 MIL/uL — ABNORMAL LOW (ref 3.87–5.11)
RBC: 3.27 MIL/uL — AB (ref 3.87–5.11)
RBC: 3.42 MIL/uL — ABNORMAL LOW (ref 3.87–5.11)
RDW: 15.6 % — AB (ref 11.5–15.5)
RDW: 15.5 % (ref 11.5–15.5)
RDW: 15.6 % — AB (ref 11.5–15.5)
WBC: 6 10*3/uL (ref 4.0–10.5)
WBC: 6.2 10*3/uL (ref 4.0–10.5)
WBC: 6.1 10*3/uL (ref 4.0–10.5)

## 2017-11-05 LAB — COMPREHENSIVE METABOLIC PANEL
ALT: 13 U/L — ABNORMAL LOW (ref 14–54)
AST: 15 U/L (ref 15–41)
Albumin: 3.1 g/dL — ABNORMAL LOW (ref 3.5–5.0)
Alkaline Phosphatase: 84 U/L (ref 38–126)
Anion gap: 10 (ref 5–15)
BUN: 36 mg/dL — ABNORMAL HIGH (ref 6–20)
CHLORIDE: 104 mmol/L (ref 101–111)
CO2: 27 mmol/L (ref 22–32)
Calcium: 8.4 mg/dL — ABNORMAL LOW (ref 8.9–10.3)
Creatinine, Ser: 1.25 mg/dL — ABNORMAL HIGH (ref 0.44–1.00)
GFR, EST AFRICAN AMERICAN: 49 mL/min — AB (ref >60)
GFR, EST NON AFRICAN AMERICAN: 42 mL/min — AB (ref >60)
Glucose, Bld: 106 mg/dL — ABNORMAL HIGH (ref 65–99)
POTASSIUM: 3.5 mmol/L (ref 3.5–5.1)
Sodium: 141 mmol/L (ref 135–145)
Total Bilirubin: 0.7 mg/dL (ref 0.3–1.2)
Total Protein: 6 g/dL — ABNORMAL LOW (ref 6.5–8.1)

## 2017-11-05 LAB — MRSA PCR SCREENING: MRSA BY PCR: NEGATIVE

## 2017-11-05 MED ORDER — FUROSEMIDE 20 MG PO TABS
20.0000 mg | ORAL_TABLET | Freq: Two times a day (BID) | ORAL | Status: DC
  Filled 2017-11-05: qty 1

## 2017-11-05 MED ORDER — VITAMIN B-12 1000 MCG PO TABS
1000.0000 ug | ORAL_TABLET | Freq: Every day | ORAL | Status: DC
  Administered 2017-11-05 – 2017-11-09 (×5): 1000 ug via ORAL
  Filled 2017-11-05 (×5): qty 1

## 2017-11-05 MED ORDER — CHLORTHALIDONE 25 MG PO TABS
25.0000 mg | ORAL_TABLET | Freq: Every day | ORAL | Status: DC
Start: 2017-11-06 — End: 2017-11-06
  Filled 2017-11-05: qty 1

## 2017-11-05 MED ORDER — AMIODARONE HCL 200 MG PO TABS
200.0000 mg | ORAL_TABLET | Freq: Every day | ORAL | Status: DC
  Administered 2017-11-05 – 2017-11-09 (×5): 200 mg via ORAL
  Filled 2017-11-05 (×5): qty 1

## 2017-11-05 MED ORDER — ALPRAZOLAM 0.25 MG PO TABS
0.2500 mg | ORAL_TABLET | Freq: Every day | ORAL | Status: DC
  Administered 2017-11-05 – 2017-11-08 (×4): 0.25 mg via ORAL
  Filled 2017-11-05 (×4): qty 1

## 2017-11-05 MED ORDER — LOSARTAN POTASSIUM 50 MG PO TABS
50.0000 mg | ORAL_TABLET | Freq: Every day | ORAL | Status: DC
  Filled 2017-11-05: qty 1

## 2017-11-05 MED ORDER — DILTIAZEM HCL ER COATED BEADS 120 MG PO CP24
120.0000 mg | ORAL_CAPSULE | Freq: Every day | ORAL | Status: DC
  Administered 2017-11-05 – 2017-11-09 (×5): 120 mg via ORAL
  Filled 2017-11-05 (×5): qty 1

## 2017-11-05 MED ORDER — POTASSIUM CHLORIDE CRYS ER 20 MEQ PO TBCR
20.0000 meq | EXTENDED_RELEASE_TABLET | Freq: Every day | ORAL | Status: DC
  Administered 2017-11-05 – 2017-11-06 (×2): 20 meq via ORAL
  Filled 2017-11-05 (×2): qty 1

## 2017-11-05 MED ORDER — FUROSEMIDE 20 MG PO TABS
20.0000 mg | ORAL_TABLET | Freq: Two times a day (BID) | ORAL | Status: DC
  Administered 2017-11-05 – 2017-11-06 (×2): 20 mg via ORAL
  Filled 2017-11-05 (×2): qty 1

## 2017-11-05 MED ORDER — VITAMIN D3 25 MCG (1000 UNIT) PO TABS
1000.0000 [IU] | ORAL_TABLET | Freq: Every day | ORAL | Status: DC
Start: 2017-11-05 — End: 2017-11-09
  Administered 2017-11-05 – 2017-11-09 (×5): 1000 [IU] via ORAL
  Filled 2017-11-05 (×5): qty 1

## 2017-11-05 MED ORDER — SERTRALINE HCL 50 MG PO TABS
25.0000 mg | ORAL_TABLET | Freq: Every day | ORAL | Status: DC
  Administered 2017-11-05 – 2017-11-09 (×5): 25 mg via ORAL
  Filled 2017-11-05 (×5): qty 1

## 2017-11-05 MED ORDER — SODIUM CHLORIDE 0.9 % IV SOLN
8.0000 mg/h | INTRAVENOUS | Status: DC
  Administered 2017-11-05 (×3): 8 mg/h via INTRAVENOUS
  Filled 2017-11-05 (×6): qty 80

## 2017-11-05 MED ORDER — ALLOPURINOL 100 MG PO TABS
300.0000 mg | ORAL_TABLET | Freq: Every day | ORAL | Status: DC
  Administered 2017-11-05 – 2017-11-09 (×5): 300 mg via ORAL
  Filled 2017-11-05 (×5): qty 3

## 2017-11-05 MED ORDER — CHLORTHALIDONE 25 MG PO TABS
25.0000 mg | ORAL_TABLET | Freq: Every day | ORAL | Status: DC
  Administered 2017-11-05: 25 mg via ORAL
  Filled 2017-11-05: qty 1

## 2017-11-05 MED ORDER — SODIUM CHLORIDE 0.9 % IV SOLN
INTRAVENOUS | Status: AC
  Administered 2017-11-05: 03:00:00 via INTRAVENOUS

## 2017-11-05 MED ORDER — SODIUM CHLORIDE 0.9 % IV SOLN
80.0000 mg | Freq: Once | INTRAVENOUS | Status: AC
  Administered 2017-11-05: 80 mg via INTRAVENOUS
  Filled 2017-11-05: qty 80

## 2017-11-05 MED ORDER — LOSARTAN POTASSIUM 50 MG PO TABS
50.0000 mg | ORAL_TABLET | Freq: Every day | ORAL | Status: DC
  Administered 2017-11-05: 50 mg via ORAL
  Filled 2017-11-05: qty 1

## 2017-11-05 MED ORDER — FUROSEMIDE 20 MG PO TABS: 20.0000 mg | ORAL_TABLET | Freq: Two times a day (BID) | ORAL | Status: DC

## 2017-11-05 NOTE — Progress Notes (Signed)
Patient admitted early this morning after midnight and H&P has been reviewed and I am in current agreement with assessment and plan done by Dr. Jani Gravel.  Additional changes to the plan of care been made accordingly.  Patient is a 72 year old female with a past medical history of hypertension, CKD stage III, History of DVT, History of of Cancer of the Ampulla of Vater, Gout, Rhuematoid Arthritis, and other comorbidities who presented to [facility] emergency room with chief complaint of melanotic stools as well as one episode of coffee-ground emesis.  She recently underwent biopsy of her ampulla of Vater at [facility] on 11/03/2017 and subsequently presented with the above complaints. She was admitted for likely upper GI bleeding from where she had a biopsy site and was placed on a Protonix drip.  Gastroenterology was consulted for further evaluation and recommendations.  Dr. Collene Mares evaluated the patient and placed patient on a full liquid diet currently upper endoscopic evaluation and felt that the patient to be closely monitored.  We will continue Protonix drip for now and continue to monitor patient's response to clinical intervention and repeat blood work in the AM..

## 2017-11-05 NOTE — ED Notes (Signed)
ED TO INPATIENT HANDOFF REPORT  Name/Age/Gender Peggy Armstrong 72 y.o. female  Code Status Code Status History    Date Active Date Inactive Code Status Order ID Comments User Context   03/22/2017 2340 03/30/2017 2110 Full Code [id number]  Rise Patience, MD ED      Home/SNF/Other Home  Chief Complaint vomiting blood, blood in stool  Level of Care/Admitting Diagnosis ED Disposition    ED Disposition Condition [place]: [place] [100102]  Level of Care: Stepdown [14]  Admit to SDU based on following criteria: Hemodynamic compromise or significant risk of instability:  Patient requiring short term acute titration and management of vasoactive drips, and invasive monitoring (i.e., CVP and Arterial line).  Diagnosis: Hematemesis [578.0.ICD-9-CM]  Admitting Physician: Jani Gravel [[id number]]  Attending Physician: Jani Gravel [phone]  Estimated length of stay: past midnight tomorrow  Certification:: I certify this patient will need inpatient services for at least 2 midnights  PT Class (Do Not Modify): Inpatient [101]  PT Acc Code (Do Not Modify): Private [1]       Medical History Past Medical History:  Diagnosis Date  . DVT (deep venous thrombosis) (HCC)    right DVT  . GERD (gastroesophageal reflux disease)   . Gout   . Gout   . Hypertension   . RA (rheumatoid arthritis) (HCC)     Allergies Allergies  Allergen Reactions  . Codeine Nausea And Vomiting    Made "very sick"    IV Location/Drains/Wounds Patient Lines/Drains/Airways Status   Active Line/Drains/Airways    Name:   Placement date:   Placement time:   Site:   Days:   Peripheral IV 11/04/17 Left Forearm   11/04/17    2257    Forearm   1   Biliary Tube Cook slip-coat 10.2 Fr. Other (comment)   04/03/17    1242    Other (comment)   216   GI Stent    06/15/17    0830    -   143          Labs/Imaging Results for orders placed or performed during the hospital  encounter of 11/04/17 (from the past 48 hour(s))  Comprehensive metabolic panel     Status: Abnormal   Collection Time: 11/04/17 10:40 PM  Result Value Ref Range   Sodium 143 135 - 145 mmol/L   Potassium 3.7 3.5 - 5.1 mmol/L   Chloride 103 101 - 111 mmol/L   CO2 28 22 - 32 mmol/L   Glucose, Bld 110 (H) 65 - 99 mg/dL   BUN 33 (H) 6 - 20 mg/dL   Creatinine, Ser 1.25 (H) 0.44 - 1.00 mg/dL   Calcium 8.6 (L) 8.9 - 10.3 mg/dL   Total Protein 6.8 6.5 - 8.1 g/dL   Albumin 3.4 (L) 3.5 - 5.0 g/dL   AST 16 15 - 41 U/L   ALT 14 14 - 54 U/L   Alkaline Phosphatase 94 38 - 126 U/L   Total Bilirubin 0.5 0.3 - 1.2 mg/dL   GFR calc non Af Amer 42 (L) >60 mL/min   GFR calc Af Amer 49 (L) >60 mL/min    Comment: (NOTE) The eGFR has been calculated using the CKD EPI equation. This calculation has not been validated in all clinical situations. eGFR's persistently <60 mL/min signify possible Chronic Kidney Disease.    Anion gap 12 5 - 15    Comment: Performed at [facility], [place]  [address], [place], [place] [address]  CBC     Status: Abnormal   Collection Time: 11/04/17 10:40 PM  Result Value Ref Range   WBC 6.8 4.0 - 10.5 K/uL   RBC 3.91 3.87 - 5.11 MIL/uL   Hemoglobin 11.1 (L) 12.0 - 15.0 g/dL   HCT 34.9 (L) 36.0 - 46.0 %   MCV 89.3 78.0 - 100.0 fL   MCH 28.4 26.0 - 34.0 pg   MCHC 31.8 30.0 - 36.0 g/dL   RDW 15.7 (H) 11.5 - 15.5 %   Platelets 220 150 - 400 K/uL    Comment: Performed at [facility], [place] [address], [place], [place] [address]  Protime-INR - (order if Patient is taking Coumadin / Warfarin)     Status: None   Collection Time: 11/04/17 10:40 PM  Result Value Ref Range   Prothrombin Time 14.2 11.4 - 15.2 seconds   INR 1.10     Comment: Performed at [facility], [place] [address], [place], [place] [address]  POC occult blood, ED     Status: Abnormal   Collection Time: 11/04/17 11:20 PM  Result Value Ref Range   Fecal  Occult Bld POSITIVE (A) NEGATIVE   No results found.  Pending Labs Unresulted Labs (From admission, onward)   Start     Ordered   11/05/17 0500  CBC  Tomorrow morning,   R     11/05/17 0002   11/05/17 0500  Comprehensive metabolic panel  Tomorrow morning,   R     11/05/17 0002   11/04/17 2229  Type and screen [place]  STAT,   STAT    Comments:  [place]    11/04/17 2228      Vitals/Pain Today's Vitals   11/04/17 2228 11/04/17 2230 11/05/17 0002  BP: (!) 141/104  110/74  Pulse: 71  68  Resp: 16  20  Temp: 97.9 F (36.6 C)    TempSrc: Oral    SpO2: 97%  96%  Weight:  240 lb (108.9 kg)   Height:  _0  (1.549 m)   PainSc:  0-No pain 4     Isolation Precautions No active isolations  Medications Medications - No data to display  Mobility walks

## 2017-11-05 NOTE — ED Notes (Signed)
ED TO INPATIENT HANDOFF REPORT  Name/Age/Gender Peggy Armstrong 72 y.o. female  Code Status    Code Status Orders  (From admission, onward)        Start     Ordered   11/05/17 0126  Full code  Continuous     11/05/17 0127    Code Status History    Date Active Date Inactive Code Status Order ID Comments User Context   03/22/2017 2340 03/30/2017 2110 Full Code [id number]  Rise Patience, MD ED      Home/SNF/Other Home  Chief Complaint vomiting blood, blood in stool  Level of Care/Admitting Diagnosis ED Disposition    ED Disposition Condition [place]: [place] [100102]  Level of Care: Stepdown [14]  Admit to SDU based on following criteria: Hemodynamic compromise or significant risk of instability:  Patient requiring short term acute titration and management of vasoactive drips, and invasive monitoring (i.e., CVP and Arterial line).  Diagnosis: Melena [170500]  Admitting Physician: Jani Gravel [place]  Attending Physician: Jani Gravel [phone]  Estimated length of stay: past midnight tomorrow  Certification:: I certify this patient will need inpatient services for at least 2 midnights  PT Class (Do Not Modify): Inpatient [101]  PT Acc Code (Do Not Modify): Private [1]       Medical History Past Medical History:  Diagnosis Date  . DVT (deep venous thrombosis) (HCC)    right DVT  . GERD (gastroesophageal reflux disease)   . Gout   . Gout   . Hypertension   . RA (rheumatoid arthritis) (HCC)     Allergies Allergies  Allergen Reactions  . Codeine Nausea And Vomiting    Made "very sick"    IV Location/Drains/Wounds Patient Lines/Drains/Airways Status   Active Line/Drains/Airways    Name:   Placement date:   Placement time:   Site:   Days:   Peripheral IV 11/04/17 Left Forearm   11/04/17    2257    Forearm   1   Biliary Tube Cook slip-coat 10.2 Fr. Other (comment)   04/03/17    1242    Other (comment)   216   GI Stent    06/15/17    0830    -   143          Labs/Imaging Results for orders placed or performed during the hospital encounter of 11/04/17 (from the past 48 hour(s))  Comprehensive metabolic panel     Status: Abnormal   Collection Time: 11/04/17 10:40 PM  Result Value Ref Range   Sodium 143 135 - 145 mmol/L   Potassium 3.7 3.5 - 5.1 mmol/L   Chloride 103 101 - 111 mmol/L   CO2 28 22 - 32 mmol/L   Glucose, Bld 110 (H) 65 - 99 mg/dL   BUN 33 (H) 6 - 20 mg/dL   Creatinine, Ser 1.25 (H) 0.44 - 1.00 mg/dL   Calcium 8.6 (L) 8.9 - 10.3 mg/dL   Total Protein 6.8 6.5 - 8.1 g/dL   Albumin 3.4 (L) 3.5 - 5.0 g/dL   AST 16 15 - 41 U/L   ALT 14 14 - 54 U/L   Alkaline Phosphatase 94 38 - 126 U/L   Total Bilirubin 0.5 0.3 - 1.2 mg/dL   GFR calc non Af Amer 42 (L) >60 mL/min   GFR calc Af Amer 49 (L) >60 mL/min    Comment: (NOTE) The eGFR has been calculated using the CKD EPI equation.  This calculation has not been validated in all clinical situations. eGFR's persistently <60 mL/min signify possible Chronic Kidney Disease.    Anion gap 12 5 - 15    Comment: Performed at [facility], [place] [address], [place], [place] [address]  CBC     Status: Abnormal   Collection Time: 11/04/17 10:40 PM  Result Value Ref Range   WBC 6.8 4.0 - 10.5 K/uL   RBC 3.91 3.87 - 5.11 MIL/uL   Hemoglobin 11.1 (L) 12.0 - 15.0 g/dL   HCT 34.9 (L) 36.0 - 46.0 %   MCV 89.3 78.0 - 100.0 fL   MCH 28.4 26.0 - 34.0 pg   MCHC 31.8 30.0 - 36.0 g/dL   RDW 15.7 (H) 11.5 - 15.5 %   Platelets 220 150 - 400 K/uL    Comment: Performed at [facility], [place] [address], [place], [place] [address]  Protime-INR - (order if Patient is taking Coumadin / Warfarin)     Status: None   Collection Time: 11/04/17 10:40 PM  Result Value Ref Range   Prothrombin Time 14.2 11.4 - 15.2 seconds   INR 1.10     Comment: Performed at [facility], [place] [address] [place], [place]  [address]  Type and screen [place]     Status: None   Collection Time: 11/04/17 10:42 PM  Result Value Ref Range   ABO/RH(D) O POS    Antibody Screen NEG    Sample Expiration      11/07/2017 Performed at [facility], [place] [address], [place], [place] [address]   POC occult blood, ED     Status: Abnormal   Collection Time: 11/04/17 11:20 PM  Result Value Ref Range   Fecal Occult Bld POSITIVE (A) NEGATIVE   No results found.  Pending Labs Unresulted Labs (From admission, onward)   Start     Ordered   11/05/17 1200  CBC  Once,   R     11/05/17 0127   11/05/17 0500  CBC  Tomorrow morning,   R     11/05/17 0002   11/05/17 0500  Comprehensive metabolic panel  Tomorrow morning,   R     11/05/17 0002      Vitals/Pain Today's Vitals   11/04/17 2228 11/04/17 2230 11/05/17 0002 11/05/17 0107  BP: (!) 141/104  110/74   Pulse: 71  68   Resp: 16  20   Temp: 97.9 F (36.6 C)     TempSrc: Oral     SpO2: 97%  96%   Weight:  240 lb (108.9 kg)    Height:  '5\' 1"'  (1.549 m)    PainSc:  0-No pain 4  0-No pain    Isolation Precautions No active isolations  Medications Medications  pantoprazole (PROTONIX) 80 mg in sodium chloride 0.9 % 100 mL IVPB (has no administration in time range)  pantoprazole (PROTONIX) 80 mg in sodium chloride 0.9 % 250 mL (0.32 mg/mL) infusion (has no administration in time range)  0.9 %  sodium chloride infusion (has no administration in time range)    Mobility walks with device

## 2017-11-05 NOTE — Progress Notes (Signed)
Patient had very large black ,tarry stool. Reported to Dr. Alfredia Ferguson and cbc ordered for 1800. Peggy Armstrong

## 2017-11-05 NOTE — H&P (Signed)
TRH H&P   Patient Demographics:    Peggy Armstrong, is a 72 y.o. female  MRN: [id number]   DOB - 07-01-1945  Admit Date - 11/04/2017  Outpatient Primary MD for the patient is [name], [name], MD  Referring MD/NP/PA: Addison Lank  Outpatient Specialists:    [facility]  Patient coming from: home  Chief Complaint  Patient presents with  . Hematemesis  . Post-op Problem      HPI:    Peggy Armstrong  is a 72 y.o. female, w Hypertension, ckd 3,  ? Aflutter, DVT, Jerrye Bushy, cancer of ampulla of vater, presents with black stool this am and then n/v later in the day x1, ? Brown liquid.  No bright red blood.  Pt just had biopsy yesterday of her ampulla of vater at [facility].  She apparently called [facility] with her symptoms and was told to come to ER.   In Ed,  Xray abd/ pending   Na 143, K 3.7, Bun 33, Creatinine 1.25, Ast 16, Alt 14 Wbc 6.8, Hgb 11.1, Plt 220 INR 1.10  I requested that ED notify [facility] that patient admitted for ? GI bleeding.   Pt will be admitted for ? Bleeding from biopsy site.      Review of systems:    In addition to the HPI above,  No Fever-chills, No Headache, No changes with Vision or hearing, No problems swallowing food or Liquids, No Chest pain, Cough or Shortness of Breath, Slight abdominal discomfort since procedure epigastric No Blood in  Urine, No dysuria, No new skin rashes or bruises, No new joints pains-aches,  No new weakness, tingling, numbness in any extremity, No recent weight gain or loss, No polyuria, polydypsia or polyphagia, No significant Mental Stressors.  A full 10 point Review of Systems was done, except as stated above, all other Review of Systems were negative.   With Past History of the following :    Past Medical History:  Diagnosis Date  . DVT (deep venous thrombosis)  (HCC)    right DVT  . GERD (gastroesophageal reflux disease)   . Gout   . Gout   . Hypertension   . RA (rheumatoid arthritis) ([place])       Past Surgical History:  Procedure Laterality Date  . ABDOMINAL HYSTERECTOMY    . CHOLECYSTECTOMY    . ENDOSCOPIC RETROGRADE CHOLANGIOPANCREATOGRAPHY (ERCP) WITH PROPOFOL N/A 06/15/2017   Procedure: ENDOSCOPIC RETROGRADE CHOLANGIOPANCREATOGRAPHY (ERCP) WITH PROPOFOL;  Surgeon: [name], MD;  [facility];  Service: Endoscopy;  Laterality: N/A;  . ESOPHAGOGASTRODUODENOSCOPY (EGD) WITH PROPOFOL N/A 03/24/2017   Procedure: ESOPHAGOGASTRODUODENOSCOPY (EGD) WITH PROPOFOL;  Surgeon: Doran Stabler, MD;  [facility];  Service: Gastroenterology;  Laterality: N/A;  . EUS N/A 04/06/2017   Procedure: UPPER ENDOSCOPIC ULTRASOUND (EUS) LINEAR;  Surgeon: [name], MD;  [facility];  Service: Endoscopy;  Laterality: N/A;  to evaluate duodenal mass  . HERNIA REPAIR    . IR BILIARY DRAIN PLACEMENT WITH CHOLANGIOGRAM  03/25/2017  . IR CONVERT BILIARY DRAIN TO INT EXT BILIARY DRAIN  04/03/2017      Social History:     Social History   Tobacco Use  . Smoking status: Never Smoker  . Smokeless tobacco: Never Used  Substance Use Topics  . Alcohol use: No     Lives - at home  Mobility - walks by self   Family History :     Family History  Problem Relation Age of Onset  . Cancer Father        prostate ca      Home Medications:   Prior to Admission medications   Medication Sig Start Date End Date Taking? Authorizing Provider  allopurinol (ZYLOPRIM) 300 MG tablet Take 300 mg by mouth daily.  05/06/15  Yes [provider]  ALPRAZolam (XANAX) 0.25 MG tablet Take 0.25 mg by mouth at bedtime.  07/21/15  Yes [provider]  amiodarone (PACERONE) 200 MG tablet TAKE 1 TABLET BY MOUTH EVERY DAY 10/03/17  Yes Croitor[facility], Mihai, MD  apixaban (ELIQUIS) 5 MG TABS tablet Take 1 tablet (5 mg total) by  mouth 2 (two) times daily. Stop taking this medication on October 9th, 2018. 03/30/17  Yes Theodis Blaze, MD  chlorthalidone (HYGROTON) 25 MG tablet Take 25 mg by mouth daily. 07/22/15  Yes [provider]  cholecalciferol (VITAMIN D) 1000 units tablet Take 1,000 Units by mouth daily.   Yes [provider]  diltiazem (CARDIZEM CD) 120 MG 24 hr capsule Take 1 capsule (120 mg total) by mouth daily. 09/19/17  Yes Croitoru, Mihai, MD  furosemide (LASIX) 20 MG tablet Take 20 mg by mouth 2 (two) times daily.   Yes [provider]  losartan (COZAAR) 50 MG tablet Take 1 tablet (50 mg total) by mouth daily. 08/07/17  Yes Croitoru, Mihai, MD  pantoprazole (PROTONIX) 40 MG tablet Take 1 tablet (40 mg total) by mouth daily. 03/31/17  Yes Theodis Blaze, MD  potassium chloride (K-DUR,KLOR-CON) 10 MEQ tablet Take 20 mEq by mouth daily.    Yes [provider]  PROAIR HFA 108 (90 Base) MCG/ACT inhaler Inhale 2 puffs into the lungs every 6 (six) hours as needed for wheezing or shortness of breath.  10/24/17  Yes [provider]  sertraline (ZOLOFT) 25 MG tablet Take 25 mg by mouth daily.  07/07/17  Yes [provider]  vitamin B-12 (CYANOCOBALAMIN) 1000 MCG tablet Take 1,000 mcg by mouth daily.   Yes [provider]  diltiazem (CARDIZEM CD) 240 MG 24 hr capsule TAKE 1 CAPSULE BY MOUTH EVERY DAY Patient not taking: Reported on 11/05/2017 10/16/17   Croitoru, Mihai, MD  oxyCODONE (OXY IR/ROXICODONE) 5 MG immediate release tablet Take 1 tablet (5 mg total) by mouth every 4 (four) hours as needed for severe pain. Patient not taking: Reported on 11/05/2017 07/03/17   Heath Lark, MD     Allergies:     Allergies  Allergen Reactions  . Codeine Nausea And Vomiting    Made "very sick"     Physical Exam:   Vitals  Blood pressure 110/74, pulse 68, temperature 97.9 F (36.6 C), temperature source Oral, resp. rate 20, height 5\' 1"  (1.549 m), weight 108.9 kg (240  lb), SpO2 96 %.   1. General lying in bed in NAD,  2. Normal affect and insight, Not Suicidal or Homicidal, Awake Alert, Oriented X 3.  3. No F.N deficits, ALL C.Nerves Intact, Strength 5/5 all 4 extremities, Sensation intact all 4 extremities, Plantars down going.  4. Ears and Eyes appear Normal, Conjunctivae clear, PERRLA. Moist Oral Mucosa.  5. Supple Neck, No JVD, No cervical lymphadenopathy appriciated, No Carotid Bruits.  6. Symmetrical Chest wall movement, Good air movement bilaterally, CTAB.  7. RRR, No Gallops, Rubs or Murmurs, No Parasternal Heave.  8. Positive Bowel Sounds, Abdomen Soft, No tenderness, No organomegaly appriciated,No rebound -guarding or rigidity.  9.  No Cyanosis, Normal Skin Turgor, No Skin Rash or Bruise.  10. Good muscle tone,  joints appear normal , no effusions, Normal ROM.  11. No Palpable Lymph Nodes in Neck or Axillae Morbid obeisity Varicose veins 1+ edema     Data Review:    CBC Recent Labs  Lab 11/04/17 2240  WBC 6.8  HGB 11.1*  HCT 34.9*  PLT 220  MCV 89.3  MCH 28.4  MCHC 31.8  RDW 15.7*   ------------------------------------------------------------------------------------------------------------------  Chemistries  Recent Labs  Lab 11/04/17 2240  NA 143  K 3.7  CL 103  CO2 28  GLUCOSE 110*  BUN 33*  CREATININE 1.25*  CALCIUM 8.6*  AST 16  ALT 14  ALKPHOS 94  BILITOT 0.5   ------------------------------------------------------------------------------------------------------------------ estimated creatinine clearance is 46.4 mL/min (A) (by C-G formula based on SCr of 1.25 mg/dL (H)). ------------------------------------------------------------------------------------------------------------------ No results for input(s): TSH, T4TOTAL, T3FREE, THYROIDAB in the last 72 hours.  Invalid input(s): FREET3  Coagulation profile Recent Labs  Lab 11/04/17 2240  INR 1.10    ------------------------------------------------------------------------------------------------------------------- No results for input(s): DDIMER in the last 72 hours. -------------------------------------------------------------------------------------------------------------------  Cardiac Enzymes No results for input(s): CKMB, TROPONINI, MYOGLOBIN in the last 168 hours.  Invalid input(s): CK ------------------------------------------------------------------------------------------------------------------ No results found for: BNP   ---------------------------------------------------------------------------------------------------------------  Urinalysis    Component Value Date/Time   COLORURINE AMBER (A) 03/22/2017 1530   APPEARANCEUR HAZY (A) 03/22/2017 1530   LABSPEC 1.017 03/22/2017 1530   PHURINE 5.0 03/22/2017 1530   GLUCOSEU NEGATIVE 03/22/2017 1530   HGBUR NEGATIVE 03/22/2017 1530   BILIRUBINUR NEGATIVE 03/22/2017 1530   [place] 03/22/2017 1530   PROTEINUR NEGATIVE 03/22/2017 1530   NITRITE NEGATIVE 03/22/2017 1530   LEUKOCYTESUR NEGATIVE 03/22/2017 1530    ----------------------------------------------------------------------------------------------------------------   Imaging Results:    No results found.    Assessment & Plan:    Principal Problem:   Hematemesis Active Problems:   Atypical atrial flutter (HCC)   CKD (chronic kidney disease), stage III (HCC)   Anemia    Black stool N/v? hematemesis NPO except for medication Type and screen in place Zofran 4mg  iv q6h prn Protonix 80mg  iv x1 then 8mg / hr Please contact [facility] in am to ensure she is on consult list Cbc in am [facility] at [facility]  Anemia Check cbc in am If worsening, consider transfusion  Aflutter ? Cont Amiodarone Cont Cardizem  HOLD Eliquis, do not resume since ? GI bleeding  DVT HOLD Eliquis, do not resume since ? GI bleeding  Hypertension Cont  chlorthalidone 25mg  po qday Cont Lasix 20mg  po bid Cont Losartan 50mg  po qday Check cmp in am  CKD stage 3 Check cmp in am  Anxiety Cont Zoloft Cont xanax 0.25mg  po qhs  DVT Prophylaxis  SCDs   AM Labs Ordered, also please review Full Orders  Family Communication: Admission, patients condition and plan of care including tests being ordered  have been discussed with the patient  who indicate understanding and agree with the plan and Code Status.  Code Status FULL CODE  Likely DC to  home  Condition GUARDED    Consults called: GI by ED, please call GI in AM   Admission status: inpatient  Time spent in minutes : 45   Jani Gravel M.D on 11/05/2017 at 1:04 AM  Between 7am to 7pm - Pager - [phone]   After 7pm go to [email or web address] - password [email or web address]  [facility] - Office  [phone]

## 2017-11-05 NOTE — Consult Note (Signed)
Cross cover [facility] Reason for Consult: Melena with hematemesis. Referring Physician: THP  Peggy Armstrong is an 72 y.o. female.  HPI: as Peggy Armstrong is a 71 year old white female with multiple medical problems listed below was recently referred to [facility] for a mass at the [facility]. The the biopsies as per the patient were done on Friday05/03/2018 and the patient was advised to call them back if she at any melena or hematemesis. Patient claims she did well till the next morning after her biopsy and when she noted a melenic stool. She denied having any abdominal pain at that time yesterday evening she felt queasy and nauseated and then vomited a large amount of black emesis and had some melenic stools as well. This prompted her to come to the emergency room for further evaluation and treatment. Her history is complicated by a right leg DVT requiring anticoagulation. She's been on Eliquis which has been held over the last week as she was scheduled to undergo biopsy at the ampullary mass at [facility].   Past Medical History:  Diagnosis Date  . DVT (deep venous thrombosis) (HCC)    right DVT  . GERD (gastroesophageal reflux disease)   . Gout   . Hypertension   . RA (rheumatoid arthritis) ([place])    Past Surgical History:  Procedure Laterality Date  . ABDOMINAL HYSTERECTOMY    . CHOLECYSTECTOMY    . ENDOSCOPIC RETROGRADE CHOLANGIOPANCREATOGRAPHY (ERCP) WITH PROPOFOL N/A 06/15/2017   Procedure: ENDOSCOPIC RETROGRADE CHOLANGIOPANCREATOGRAPHY (ERCP) WITH PROPOFOL;  Surgeon: [name], MD;  [facility];  Service: Endoscopy;  Laterality: N/A;  . ESOPHAGOGASTRODUODENOSCOPY (EGD) WITH PROPOFOL N/A 03/24/2017   Procedure: ESOPHAGOGASTRODUODENOSCOPY (EGD) WITH PROPOFOL;  Surgeon: Doran Stabler, MD;  [facility];  Service: Gastroenterology;  Laterality: N/A;  . EUS N/A 04/06/2017   Procedure: UPPER ENDOSCOPIC ULTRASOUND (EUS) LINEAR;  Surgeon: [name], MD;  [facility];  Service: Endoscopy;  Laterality: N/A;  to evaluate duodenal mass  . HERNIA REPAIR    . IR BILIARY DRAIN PLACEMENT WITH CHOLANGIOGRAM  03/25/2017  . IR CONVERT BILIARY DRAIN TO INT EXT BILIARY DRAIN  04/03/2017   Family History  Problem Relation Age of Onset  . Cancer Father        prostate ca   Social History:  reports that she has never smoked. She has never used smokeless tobacco. She reports that she does not drink alcohol or use drugs.  Allergies:  Allergies  Allergen Reactions  . Codeine Nausea And Vomiting    Made "very sick"   Medications: I have reviewed the patient's current medications.  Results for orders placed or performed during the hospital encounter of 11/04/17 (from the past 48 hour(s))  Comprehensive metabolic panel     Status: Abnormal   Collection Time: 11/04/17 10:40 PM  Result Value Ref Range   Sodium 143 135 - 145 mmol/L   Potassium 3.7 3.5 - 5.1 mmol/L   Chloride 103 101 - 111 mmol/L   CO2 28 22 - 32 mmol/L   Glucose, Bld 110 (H) 65 - 99 mg/dL   BUN 33 (H) 6 - 20 mg/dL   Creatinine, Ser 1.25 (H) 0.44 - 1.00 mg/dL   Calcium 8.6 (L) 8.9 - 10.3 mg/dL   Total Protein 6.8 6.5 - 8.1 g/dL   Albumin 3.4 (L) 3.5 - 5.0 g/dL   AST 16 15 - 41 U/L   ALT 14 14 - 54 U/L  Alkaline Phosphatase 94 38 - 126 U/L   Total Bilirubin 0.5 0.3 - 1.2 mg/dL   GFR calc non Af Amer 42 (L) >60 mL/min   GFR calc Af Amer 49 (L) >60 mL/min    Comment: (NOTE) The eGFR has been calculated using the CKD EPI equation. This calculation has not been validated in all clinical situations. eGFR's persistently <60 mL/min signify possible Chronic Kidney Disease.    Anion gap 12 5 - 15    Comment: Performed at [facility], [place] [address] [place], [place] [address]  CBC     Status: Abnormal   Collection Time: 11/04/17 10:40 PM  Result Value Ref Range   WBC 6.8 4.0 - 10.5 K/uL   RBC 3.91 3.87 - 5.11 MIL/uL   Hemoglobin 11.1 (L)  12.0 - 15.0 g/dL   HCT 34.9 (L) 36.0 - 46.0 %   MCV 89.3 78.0 - 100.0 fL   MCH 28.4 26.0 - 34.0 pg   MCHC 31.8 30.0 - 36.0 g/dL   RDW 15.7 (H) 11.5 - 15.5 %   Platelets 220 150 - 400 K/uL    Comment: Performed at [facility], [place] [address], [place], [place] [address]  Protime-INR - (order if Patient is taking Coumadin / Warfarin)     Status: None   Collection Time: 11/04/17 10:40 PM  Result Value Ref Range   Prothrombin Time 14.2 11.4 - 15.2 seconds   INR 1.10     Comment: Performed at [facility], [place] [address], [place], [place] [address]  Type and screen [place]     Status: None   Collection Time: 11/04/17 10:42 PM  Result Value Ref Range   ABO/RH(D) O POS    Antibody Screen NEG    Sample Expiration      11/07/2017 Performed at [facility], [place] [address] [place], [place] [address]   POC occult blood, ED     Status: Abnormal   Collection Time: 11/04/17 11:20 PM  Result Value Ref Range   Fecal Occult Bld POSITIVE (A) NEGATIVE  MRSA PCR Screening     Status: None   Collection Time: 11/05/17  2:02 AM  Result Value Ref Range   MRSA by PCR NEGATIVE NEGATIVE    Comment:        The GeneXpert MRSA Assay (FDA approved for NASAL specimens only), is one component of a comprehensive MRSA colonization surveillance program. It is not intended to diagnose MRSA infection nor to guide or monitor treatment for MRSA infections. Performed at [facility], [place] [address] [place], [place] [address]   CBC     Status: Abnormal   Collection Time: 11/05/17  3:35 AM  Result Value Ref Range   WBC 6.0 4.0 - 10.5 K/uL   RBC 3.58 (L) 3.87 - 5.11 MIL/uL   Hemoglobin 10.1 (L) 12.0 - 15.0 g/dL   HCT 31.8 (L) 36.0 - 46.0 %   MCV 88.8 78.0 - 100.0 fL   MCH 28.2 26.0 - 34.0 pg   MCHC 31.8 30.0 - 36.0 g/dL   RDW 15.6 (H) 11.5 - 15.5 %   Platelets 168 150 - 400 K/uL    Comment: Performed at  [facility], [place] [address] [place], [place] [address]  Comprehensive metabolic panel     Status: Abnormal   Collection Time: 11/05/17  3:35 AM  Result Value Ref Range   Sodium 141 135 - 145  mmol/L   Potassium 3.5 3.5 - 5.1 mmol/L   Chloride 104 101 - 111 mmol/L   CO2 27 22 - 32 mmol/L   Glucose, Bld 106 (H) 65 - 99 mg/dL   BUN 36 (H) 6 - 20 mg/dL   Creatinine, Ser 1.25 (H) 0.44 - 1.00 mg/dL   Calcium 8.4 (L) 8.9 - 10.3 mg/dL   Total Protein 6.0 (L) 6.5 - 8.1 g/dL   Albumin 3.1 (L) 3.5 - 5.0 g/dL   AST 15 15 - 41 U/L   ALT 13 (L) 14 - 54 U/L   Alkaline Phosphatase 84 38 - 126 U/L   Total Bilirubin 0.7 0.3 - 1.2 mg/dL   GFR calc non Af Amer 42 (L) >60 mL/min   GFR calc Af Amer 49 (L) >60 mL/min    Comment: (NOTE) The eGFR has been calculated using the CKD EPI equation. This calculation has not been validated in all clinical situations. eGFR's persistently <60 mL/min signify possible Chronic Kidney Disease.    Anion gap 10 5 - 15    Comment: Performed at [facility], [place] [address], [place],  [address]    Dg Abd 2 Views  Result Date: 11/05/2017 CLINICAL DATA:  72 year old female with nausea vomiting. EXAM: ABDOMEN - 2 VIEW COMPARISON:  CT of the abdomen pelvis dated 03/22/2017 FINDINGS: There is moderate stool throughout the colon. No bowel dilatation or evidence of obstruction. No free air or radiopaque calculi. Aortoiliac atherosclerotic disease. The soft tissues and osseous structures are grossly unremarkable. IMPRESSION: No evidence of bowel obstruction. Electronically Signed   By: Anner Crete M.D.   On: 11/05/2017 01:58   Review of Systems  Constitutional: Negative.   HENT: Negative.   Eyes: Negative.   Respiratory: Negative.   Cardiovascular: Negative.   Gastrointestinal: Positive for blood in stool, heartburn, melena, nausea and vomiting. Negative for abdominal pain, constipation and diarrhea.  Musculoskeletal:  Positive for joint pain.  Skin: Negative.   Neurological: Negative.   Endo/Heme/Allergies: Negative.   Psychiatric/Behavioral: Negative.    Blood pressure (!) 165/66, pulse 60, temperature 98 F (36.7 C), temperature source Oral, resp. rate 20, height '5\' 1"'  (1.549 m), weight 108.9 kg (240 lb), SpO2 97 %. Physical Exam  Constitutional: She is oriented to person, place, and time. She appears well-developed and well-nourished.  HENT:  Head: Normocephalic and atraumatic.  Eyes: Pupils are equal, round, and reactive to light. Conjunctivae and EOM are normal.  Neck: Neck supple.  Cardiovascular: Normal rate and regular rhythm.  Respiratory: Effort normal and breath sounds normal.  GI: Soft. Bowel sounds are normal.  Neurological: She is alert and oriented to person, place, and time.  Skin: Skin is warm and dry.  Psychiatric: She has a normal mood and affect. Her behavior is normal. Judgment and thought content normal.   Assessment/Plan: 1) Melena and hematemesis after biopsy of an ampullary mass-recommend supportive care for now transfuse as needed; will do serial CBCs.  2) GERD.  3) Iron deficiency anemia.  4) HTN. 5) Right leg DVT-was on Eliquis till recently. 6) Rheumatoid arthritis/Gout. 7) CKD. Marland KitchenMANN,Waylon Hershey 11/05/2017, 9:48 AM

## 2017-11-06 DIAGNOSIS — K922 Gastrointestinal hemorrhage, unspecified: Secondary | ICD-10-CM

## 2017-11-06 LAB — CBC WITH DIFFERENTIAL/PLATELET
BASOS ABS: 0 10*3/uL (ref 0.0–0.1)
BASOS PCT: 0 %
BASOS PCT: 0 %
Basophils Absolute: 0 10*3/uL (ref 0.0–0.1)
EOS ABS: 0.2 10*3/uL (ref 0.0–0.7)
EOS PCT: 2 %
Eosinophils Absolute: 0.2 10*3/uL (ref 0.0–0.7)
Eosinophils Relative: 3 %
HCT: 30.2 % — ABNORMAL LOW (ref 36.0–46.0)
HEMATOCRIT: 27.3 % — AB (ref 36.0–46.0)
HEMOGLOBIN: 8.7 g/dL — AB (ref 12.0–15.0)
Hemoglobin: 9.5 g/dL — ABNORMAL LOW (ref 12.0–15.0)
Lymphocytes Relative: 16 %
Lymphocytes Relative: 11 %
Lymphs Abs: 0.9 10*3/uL (ref 0.7–4.0)
Lymphs Abs: 0.8 10*3/uL (ref 0.7–4.0)
MCH: 28.6 pg (ref 26.0–34.0)
MCH: 28.4 pg (ref 26.0–34.0)
MCHC: 31.9 g/dL (ref 30.0–36.0)
MCHC: 31.5 g/dL (ref 30.0–36.0)
MCV: 89.8 fL (ref 78.0–100.0)
MCV: 90.4 fL (ref 78.0–100.0)
MONO ABS: 0.5 10*3/uL (ref 0.1–1.0)
Monocytes Absolute: 0.5 10*3/uL (ref 0.1–1.0)
Monocytes Relative: 8 %
Monocytes Relative: 7 %
NEUTROS ABS: 4.1 10*3/uL (ref 1.7–7.7)
NEUTROS PCT: 73 %
Neutro Abs: 6 10*3/uL (ref 1.7–7.7)
Neutrophils Relative %: 80 %
PLATELETS: 186 10*3/uL (ref 150–400)
Platelets: 155 10*3/uL (ref 150–400)
RBC: 3.04 MIL/uL — AB (ref 3.87–5.11)
RBC: 3.34 MIL/uL — ABNORMAL LOW (ref 3.87–5.11)
RDW: 15.7 % — ABNORMAL HIGH (ref 11.5–15.5)
RDW: 15.6 % — AB (ref 11.5–15.5)
WBC: 5.6 10*3/uL (ref 4.0–10.5)
WBC: 7.5 10*3/uL (ref 4.0–10.5)

## 2017-11-06 LAB — COMPREHENSIVE METABOLIC PANEL
ALK PHOS: 77 U/L (ref 38–126)
ALT: 13 U/L — AB (ref 14–54)
AST: 14 U/L — AB (ref 15–41)
Albumin: 3 g/dL — ABNORMAL LOW (ref 3.5–5.0)
Anion gap: 13 (ref 5–15)
BILIRUBIN TOTAL: 0.8 mg/dL (ref 0.3–1.2)
BUN: 36 mg/dL — AB (ref 6–20)
CALCIUM: 8.7 mg/dL — AB (ref 8.9–10.3)
CO2: 27 mmol/L (ref 22–32)
CREATININE: 1.31 mg/dL — AB (ref 0.44–1.00)
Chloride: 103 mmol/L (ref 101–111)
GFR calc Af Amer: 46 mL/min — ABNORMAL LOW (ref >60)
GFR calc non Af Amer: 40 mL/min — ABNORMAL LOW (ref >60)
Glucose, Bld: 83 mg/dL (ref 65–99)
Potassium: 2.9 mmol/L — ABNORMAL LOW (ref 3.5–5.1)
Sodium: 143 mmol/L (ref 135–145)
TOTAL PROTEIN: 5.9 g/dL — AB (ref 6.5–8.1)

## 2017-11-06 LAB — MAGNESIUM: Magnesium: 1.6 mg/dL — ABNORMAL LOW (ref 1.7–2.4)

## 2017-11-06 LAB — HEMOGLOBIN AND HEMATOCRIT, BLOOD
HCT: 30.4 % — ABNORMAL LOW (ref 36.0–46.0)
Hemoglobin: 10 g/dL — ABNORMAL LOW (ref 12.0–15.0)

## 2017-11-06 LAB — PHOSPHORUS: Phosphorus: 3.6 mg/dL (ref 2.5–4.6)

## 2017-11-06 LAB — PREPARE RBC (CROSSMATCH)

## 2017-11-06 MED ORDER — MAGNESIUM SULFATE 2 GM/50ML IV SOLN
2.0000 g | Freq: Once | INTRAVENOUS | Status: AC
  Administered 2017-11-06: 2 g via INTRAVENOUS
  Filled 2017-11-06: qty 50

## 2017-11-06 MED ORDER — SODIUM CHLORIDE 0.9 % IV BOLUS
1000.0000 mL | Freq: Once | INTRAVENOUS | Status: AC
  Administered 2017-11-06: 1000 mL via INTRAVENOUS

## 2017-11-06 MED ORDER — SODIUM CHLORIDE 0.9 % IV SOLN
INTRAVENOUS | Status: DC
  Administered 2017-11-06: 1000 mL via INTRAVENOUS
  Administered 2017-11-07 – 2017-11-08 (×3): via INTRAVENOUS

## 2017-11-06 MED ORDER — PANTOPRAZOLE SODIUM 40 MG IV SOLR
40.0000 mg | Freq: Two times a day (BID) | INTRAVENOUS | Status: DC
  Administered 2017-11-06 – 2017-11-07 (×2): 40 mg via INTRAVENOUS
  Filled 2017-11-06 (×2): qty 40

## 2017-11-06 MED ORDER — POTASSIUM CHLORIDE 10 MEQ/100ML IV SOLN
10.0000 meq | INTRAVENOUS | Status: AC
  Administered 2017-11-06 (×4): 10 meq via INTRAVENOUS
  Filled 2017-11-06 (×4): qty 100

## 2017-11-06 MED ORDER — POTASSIUM CHLORIDE CRYS ER 20 MEQ PO TBCR
40.0000 meq | EXTENDED_RELEASE_TABLET | Freq: Every day | ORAL | Status: DC
  Administered 2017-11-07 – 2017-11-09 (×3): 40 meq via ORAL
  Filled 2017-11-06 (×3): qty 2

## 2017-11-06 MED ORDER — PANTOPRAZOLE SODIUM 40 MG PO TBEC
40.0000 mg | DELAYED_RELEASE_TABLET | Freq: Two times a day (BID) | ORAL | Status: DC
  Administered 2017-11-06: 40 mg via ORAL
  Filled 2017-11-06: qty 1

## 2017-11-06 MED ORDER — SODIUM CHLORIDE 0.9 % IV SOLN
Freq: Once | INTRAVENOUS | Status: AC
  Administered 2017-11-06: 16:00:00 via INTRAVENOUS

## 2017-11-06 NOTE — Progress Notes (Signed)
Patient said she felt like she had earlier. She was seeing spots, clammy and pale. Denied pain. Sb/p 57. MD called- 1000 ml NS bolus started. B/p improved and patient said she felt better.She did receive one unit of blood. EKG was done.

## 2017-11-06 NOTE — Progress Notes (Signed)
[facility] Progress Note    Since last GI note: Slept fairly well.  Last BM was melenic, 14 hours ago. None since then. No abdominal pains.  Objective: Vital signs in last 24 hours: Temp:  [97.6 F (36.4 C)-98.7 F (37.1 C)] 97.9 F (36.6 C) (05/13 0312) Pulse Rate:  [55-71] 71 (05/13 0400) Resp:  [15-27] 27 (05/13 0400) BP: (126-168)/(54-81) 126/61 (05/13 0400) SpO2:  [96 %-97 %] 97 % (05/13 0400) Weight:  [250 lb 3.6 oz (113.5 kg)] 250 lb 3.6 oz (113.5 kg) (05/13 0500) Last BM Date: 11/05/17 General: alert and oriented times 3 Heart: regular rate and rythm Abdomen: soft, non-tender, non-distended, normal bowel sounds   Lab Results: Recent Labs    11/05/17 0335 11/05/17 1156 11/05/17 1803  WBC 6.0 6.2 6.1  HGB 10.1* 9.4* 9.7*  PLT 168 159 172  MCV 88.8 89.6 89.5   Recent Labs    11/04/17 2240 11/05/17 0335  NA 143 141  K 3.7 3.5  CL 103 104  CO2 28 27  GLUCOSE 110* 106*  BUN 33* 36*  CREATININE 1.25* 1.25*  CALCIUM 8.6* 8.4*   Recent Labs    11/04/17 2240 11/05/17 0335  PROT 6.8 6.0*  ALBUMIN 3.4* 3.1*  AST 16 15  ALT 14 13*  ALKPHOS 94 84  BILITOT 0.5 0.7   Recent Labs    11/04/17 2240  INR 1.10    Medications: Scheduled Meds: . allopurinol  300 mg Oral Daily  . ALPRAZolam  0.25 mg Oral QHS  . amiodarone  200 mg Oral Daily  . chlorthalidone  25 mg Oral Daily  . cholecalciferol  1,000 Units Oral Daily  . diltiazem  120 mg Oral Daily  . furosemide  20 mg Oral BID  . losartan  50 mg Oral Daily  . potassium chloride  20 mEq Oral Daily  . sertraline  25 mg Oral Daily  . vitamin B-12  1,000 mcg Oral Daily   Continuous Infusions: . pantoprozole (PROTONIX) infusion 8 mg/hr (11/05/17 2100)   PRN Meds:.    Assessment/Plan: 72 y.o. female with GI bleeding.  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 ERCP 05/2017 Dr. Ardis Hughs removed [facility] placed plastic biliary stent, observed 1.5cm residual periamp polyp (TVA on path), noted ovescoe clip at the site.  EUS/ERCP [facility] Dr. Okey Dupre 07/2017 biliary stent noted (removed?), ovesco clip removed, APC applied to the ampulla; EGD with side viewing duodenoscope [facility] Dr. Okey Dupre 10/2017 snare/cautery 1.5cm polyp at ampulla, also biopsies from site.    The EGD from Friday with snare/cautery and biopsy was complicated by delayed bleeding (1 day post op). She has not resumed her eliquis since the EGD.  She has been HD stable and has not required blood transfusion.    Would continue to hold her eliquis for now.  Contineu full liquids today, follow clinically. PPI IBD orally, will stop IV PPI infusion for now given critical pharmacy needs. No plans for endoscopy unless she has overt rebleeding.    [name], MD  11/06/2017, 8:09 AM [facility] Pager [phone]

## 2017-11-06 NOTE — H&P (View-Only) (Signed)
[facility] Progress Note    Since last GI note: Slept fairly well.  Last BM was melenic, 14 hours ago. None since then. No abdominal pains.  Objective: Vital signs in last 24 hours: Temp:  [97.6 F (36.4 C)-98.7 F (37.1 C)] 97.9 F (36.6 C) (05/13 0312) Pulse Rate:  [55-71] 71 (05/13 0400) Resp:  [15-27] 27 (05/13 0400) BP: (126-168)/(54-81) 126/61 (05/13 0400) SpO2:  [96 %-97 %] 97 % (05/13 0400) Weight:  [250 lb 3.6 oz (113.5 kg)] 250 lb 3.6 oz (113.5 kg) (05/13 0500) Last BM Date: 11/05/17 General: alert and oriented times 3 Heart: regular rate and rythm Abdomen: soft, non-tender, non-distended, normal bowel sounds   Lab Results: Recent Labs    11/05/17 0335 11/05/17 1156 11/05/17 1803  WBC 6.0 6.2 6.1  HGB 10.1* 9.4* 9.7*  PLT 168 159 172  MCV 88.8 89.6 89.5   Recent Labs    11/04/17 2240 11/05/17 0335  NA 143 141  K 3.7 3.5  CL 103 104  CO2 28 27  GLUCOSE 110* 106*  BUN 33* 36*  CREATININE 1.25* 1.25*  CALCIUM 8.6* 8.4*   Recent Labs    11/04/17 2240 11/05/17 0335  PROT 6.8 6.0*  ALBUMIN 3.4* 3.1*  AST 16 15  ALT 14 13*  ALKPHOS 94 84  BILITOT 0.5 0.7   Recent Labs    11/04/17 2240  INR 1.10    Medications: Scheduled Meds: . allopurinol  300 mg Oral Daily  . ALPRAZolam  0.25 mg Oral QHS  . amiodarone  200 mg Oral Daily  . chlorthalidone  25 mg Oral Daily  . cholecalciferol  1,000 Units Oral Daily  . diltiazem  120 mg Oral Daily  . furosemide  20 mg Oral BID  . losartan  50 mg Oral Daily  . potassium chloride  20 mEq Oral Daily  . sertraline  25 mg Oral Daily  . vitamin B-12  1,000 mcg Oral Daily   Continuous Infusions: . pantoprozole (PROTONIX) infusion 8 mg/hr (11/05/17 2100)   PRN Meds:.    Assessment/Plan: 72 y.o. female with GI bleeding.  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 Zenia Resides ERCP 04/2017, polypectomy at [facility]; pathology showed adenomatous lesion with extensive HGD, intramucosal cancer with areas suspicious for submucosal invasion (however extensive cautery effect). ERCP 05/2017 Dr. Ardis Hughs removed [facility] placed plastic biliary stent, observed 1.5cm residual periamp polyp (TVA on path), noted ovescoe clip at the site.  EUS/ERCP [facility] Dr. Okey Dupre 07/2017 biliary stent noted (removed?), ovesco clip removed, APC applied to the ampulla; EGD with side viewing duodenoscope [facility] Dr. Okey Dupre 10/2017 snare/cautery 1.5cm polyp at ampulla, also biopsies from site.    The EGD from Friday with snare/cautery and biopsy was complicated by delayed bleeding (1 day post op). She has not resumed her eliquis since the EGD.  She has been HD stable and has not required blood transfusion.    Would continue to hold her eliquis for now.  Contineu full liquids today, follow clinically. PPI IBD orally, will stop IV PPI infusion for now given critical pharmacy needs. No plans for endoscopy unless she has overt rebleeding.    [name], MD  11/06/2017, 8:09 AM [facility] Pager [phone]

## 2017-11-06 NOTE — Progress Notes (Signed)
Patient was in the recliner. She said she was seeing spots. She was clammy, became pale. She wanted to lie down in bed. I was unable to get a b/p. Patient sat up on edge of recliner, became unresponsive for 2-3 minutes. She was repositioned in the recliner. She was talking, alert. Her b/p was then 90/43. Dr Alfredia Ferguson was called. One liter NS bolus was started. She denied pain. She said she felt better. Her b/p improved.

## 2017-11-06 NOTE — Progress Notes (Signed)
PROGRESS NOTE    Peggy Armstrong  ZGY:[id number] DOB: [birth year] DOA: 11/04/2017 PCP: Thurman Coyer, MD   Brief Narrative:  Peggy Armstrong  is a 72 y.o. female, w Hypertension, ckd 3,  ? Aflutter, DVT, Gerd, Cancer of ampulla of vater, presents with black stool this am and then n/v later in the day x1 with coffee ground emesis  No bright red blood.  Pt just had biopsy 11/03/17 of her Ampulla of Vater at [facility].  She apparently called [facility] with her symptoms and was told to come to ER. Admitted for her Melena and bleeding suspected from Recent Biopsy site.  Oncology is been consulted and following and may do EGD in morning if hemoglobin hematocrit still continues to drop.  Hospitalization has been complicated by orthostatic hypotension all patient's antihypertensives have been stopped except the ones for rate control for her heart.  Assessment & Plan:   Principal Problem:   Hematemesis Active Problems:   Atypical atrial flutter (HCC)   CKD (chronic kidney disease), stage III (HCC)   Anemia   Melena  Acute Melena with Coffee Ground Emesis likely 2/2 to GIB from Biopsy Site at Ampulla of Vater -C/w Full Liquid Diet and NPO at [place] in case for EGD -Type and screen in place and will transfuse 1 unit of pRBC's -C/w Zofran 4mg  iv q6h prn -Protonix 80mg  iv x1 then 8mg / hr changed to po by Gastroenterology but now back on IV 40 mg q12h given drop of Hb/Hct -[facility] following and appreciate further evaluation recommendations\ -Continue to monitor for further signs of bleeding and if patient's hemoglobin hematocrit dropped further than we will likely pursue endoscopy in the morning if she has overt bleeding  Normocytic Anemia -Globin hematocrit went from 11.1/34.9 and dropped all the way down to 8.7/27.3 -We will type and screen and transfuse 1 unit of PRBCs -Follows up with Dr. need for such for intravenous iron infusions -Continue to monitor signs and symptoms of bleeding  and trend H&H every 6 -Repeat CMP in a.m.  ? A Flutter -C/w Amiodarone 200 mg p.o. daily, as well as Diltiazem 120 mg p.o. daily -Continue with Telemetry -Continue to HOLD Eliquis for now   Right Leg DVT -Continue to HOLD Eliquis, do not resume since likely GI bleeding  Orthostatic Hypotension with a Hx of HTN -Chlorthalidone 25mg  po qday, Lasix 20mg  po bid, and  Losartan 50mg  po qday stopped given her Orthostasis -Bolused 2 L of IV fluid and continue maintenance fluid at 75 mL's per hour -She has blood pressure keeps dropping and likely is from hypovolemia secondary to GI bleeding. -Recheck orthostatic vital signs after adequately hydrated  Hypokalemia -Patient's potassium level this morning was 2.9 -Replete with IV potassium chloride 40 mEq -Continue monitor and replete as necessary -Repeat CMP in AM  Hypomagnesemia -Patient's magnesium level this morning is 1.6 -Replete with IV mag sulfate 2 g -Continue to monitor and replete as necessary -Repeat magnesium level in a.m.  CKD stage 3 -Patient's BUN/creatinine is stable currently.  On admission BUN/creatinine was 33/1.25 and is now 36/1.31 -Continue to hold nephrotoxic medications including Chlorthalidone, Losartan, and Furosemide -Repeat CMP in a.m.  Depression and Anxiety -C/w Sertaline 25 mg po Daily and Alprazolam 0.25 mg po qHS   Cancer/Neoplasm of the Ampulla of Vater -Recently biopsied at [facility] had 1.5 cm polyp at the ampulla that was removed and biopsied -Continues to have follow-up between [facility] with the GI service with the  [facility] locally -Appreciate GI evaluation and follow-up.  DVT prophylaxis: SCD's Code Status: FULL CODE Family Communication: Discussed with family at bedside  Disposition Plan: Remain Inpatient for   Consultants:   Gastroenterology    Procedures: None   Antimicrobials:  Anti-infectives (From admission, onward)   None     Subjective: Seen  and examined at bedside and feels okay however later on the day she became very clammy and dropped her blood pressure twice throughout the day.  She was bolused 2 L normal saline will be transfused 1 unit of blood.  Patient felt like this whenever she got up to ambulate to the restroom.  Will continue IV fluid rehydration and because of this and concern for her continuous bleeding she is placed back on IV Protonix.  Patient denies any shortness of breath at this time.   Objective: Vitals:   11/06/17 1410 11/06/17 1412 11/06/17 1545 11/06/17 1609  BP: (!) 99/46 (!) 101/53 (!) 151/66 (!) 150/62  Pulse: (!) 59 (!) 58 (!) 58 63  Resp: 14 20 20 18   Temp:   98.1 F (36.7 C) 97.6 F (36.4 C)  TempSrc:   Oral Oral  SpO2: 97% 98% 97% 100%  Weight:      Height:        Intake/Output Summary (Last 24 hours) at 11/06/2017 1720 Last data filed at 11/06/2017 1609 Gross per 24 hour  Intake 562.5 ml  Output 1600 ml  Net -1037.5 ml   Filed Weights   11/04/17 2230 11/06/17 0500  Weight: 108.9 kg (240 lb) 113.5 kg (250 lb 3.6 oz)   Examination: Physical Exam:  Constitutional: WN/WD morbidly obese Caucasian female in NAD and appears calm  Eyes: Lids and conjunctivae normal, sclerae anicteric  ENMT: External Ears, Nose appear normal. Grossly normal hearing. Mucous membranes are moist.  Neck: Appears normal, supple, no cervical masses, normal ROM, no appreciable thyromegaly, no JVD Respiratory: Diminished to auscultation bilaterally, no wheezing, rales, rhonchi or crackles. Normal respiratory effort and patient is not tachypenic. No accessory muscle use.  Cardiovascular: RRR, no murmurs / rubs / gallops. S1 and S2 auscultated. No extremity edema.   Abdomen: Soft, non-tender, Distended due to body habitus. No masses palpated. No appreciable hepatosplenomegaly. Bowel sounds positive.  GU: Deferred. Musculoskeletal: No clubbing / cyanosis of digits/nails. No joint deformity upper and lower  extremities. Skin: No rashes, lesions, ulcers on a limited skin evaluation. No induration; Warm and dry.  Neurologic: CN 2-12 grossly intact with no focal deficits. Romberg sign and cerebellar reflexes not assessed.  Psychiatric: Normal judgment and insight. Alert and oriented x 3. Normal mood and appropriate affect.   Data Reviewed: I have personally reviewed following labs and imaging studies  CBC: Recent Labs  Lab 11/05/17 0335 11/05/17 1156 11/05/17 1803 11/06/17 0853 11/06/17 1440  WBC 6.0 6.2 6.1 5.6 7.5  NEUTROABS  --   --   --  4.1 6.0  HGB 10.1* 9.4* 9.7* 8.7* 9.5*  HCT 31.8* 29.3* 30.6* 27.3* 30.2*  MCV 88.8 89.6 89.5 89.8 90.4  PLT 168 159 172 155 341   Basic Metabolic Panel: Recent Labs  Lab 11/04/17 2240 11/05/17 0335 11/06/17 0853  NA 143 141 143  K 3.7 3.5 2.9*  CL 103 104 103  CO2 28 27 27   GLUCOSE 110* 106* 83  BUN 33* 36* 36*  CREATININE 1.25* 1.25* 1.31*  CALCIUM 8.6* 8.4* 8.7*  MG  --   --  1.6*  PHOS  --   --  3.6   GFR: Estimated Creatinine Clearance: 45.4 mL/min (A) (by C-G formula based on SCr of 1.31 mg/dL (H)). Liver Function Tests: Recent Labs  Lab 11/04/17 2240 11/05/17 0335 11/06/17 0853  AST 16 15 14*  ALT 14 13* 13*  ALKPHOS 94 84 77  BILITOT 0.5 0.7 0.8  PROT 6.8 6.0* 5.9*  ALBUMIN 3.4* 3.1* 3.0*   No results for input(s): LIPASE, AMYLASE in the last 168 hours. No results for input(s): AMMONIA in the last 168 hours. Coagulation Profile: Recent Labs  Lab 11/04/17 2240  INR 1.10   Cardiac Enzymes: No results for input(s): CKTOTAL, CKMB, CKMBINDEX, TROPONINI in the last 168 hours. BNP (last 3 results) No results for input(s): PROBNP in the last 8760 hours. HbA1C: No results for input(s): HGBA1C in the last 72 hours. CBG: No results for input(s): GLUCAP in the last 168 hours. Lipid Profile: No results for input(s): CHOL, HDL, LDLCALC, TRIG, CHOLHDL, LDLDIRECT in the last 72 hours. Thyroid Function Tests: No results  for input(s): TSH, T4TOTAL, FREET4, T3FREE, THYROIDAB in the last 72 hours. Anemia Panel: No results for input(s): VITAMINB12, FOLATE, FERRITIN, TIBC, IRON, RETICCTPCT in the last 72 hours. Sepsis Labs: No results for input(s): PROCALCITON, LATICACIDVEN in the last 168 hours.  Recent Results (from the past 240 hour(s))  MRSA PCR Screening     Status: None   Collection Time: 11/05/17  2:02 AM  Result Value Ref Range Status   MRSA by PCR NEGATIVE NEGATIVE Final    Comment:        The GeneXpert MRSA Assay (FDA approved for NASAL specimens only), is one component of a comprehensive MRSA colonization surveillance program. It is not intended to diagnose MRSA infection nor to guide or monitor treatment for MRSA infections. Performed at [facility], [place] [address], [place], [place] [address]     Radiology Studies: Dg Abd 2 Views  Result Date: 11/05/2017 CLINICAL DATA:  72 year old female with nausea vomiting. EXAM: ABDOMEN - 2 VIEW COMPARISON:  CT of the abdomen pelvis dated 03/22/2017 FINDINGS: There is moderate stool throughout the colon. No bowel dilatation or evidence of obstruction. No free air or radiopaque calculi. Aortoiliac atherosclerotic disease. The soft tissues and osseous structures are grossly unremarkable. IMPRESSION: No evidence of bowel obstruction. Electronically Signed   By: Anner Crete M.D.   On: 11/05/2017 01:58   Scheduled Meds: . allopurinol  300 mg Oral Daily  . ALPRAZolam  0.25 mg Oral QHS  . amiodarone  200 mg Oral Daily  . cholecalciferol  1,000 Units Oral Daily  . diltiazem  120 mg Oral Daily  . pantoprazole (PROTONIX) IV  40 mg Intravenous Q12H  . [START ON 11/07/2017] potassium chloride  40 mEq Oral Daily  . sertraline  25 mg Oral Daily  . vitamin B-12  1,000 mcg Oral Daily   Continuous Infusions: . sodium chloride 1,000 mL (11/06/17 1010)  . potassium chloride 10 mEq (11/06/17 1629)    LOS: 1 day   Kerney Elbe,  DO [facility] Pager [phone]  If 7PM-7AM, please contact night-coverage [email or web address] Password [email or web address] 11/06/2017, 5:20 PM

## 2017-11-06 NOTE — Anesthesia Preprocedure Evaluation (Signed)
Anesthesia Evaluation  Patient identified by MRN, date of birth, ID band Patient awake    Reviewed: Allergy & Precautions, NPO status , Patient's Chart, lab work & pertinent test results  Airway Mallampati: II   Neck ROM: full    Dental   Pulmonary neg pulmonary ROS,    breath sounds clear to auscultation       Cardiovascular hypertension, + Peripheral Vascular Disease and + DVT   Rhythm:regular Rate:Normal  Takes eliquis   Neuro/Psych    GI/Hepatic GERD  ,  Endo/Other  Morbid obesity  Renal/GU Renal disease     Musculoskeletal  (+) Arthritis , Rheumatoid disorders,    Abdominal   Peds  Hematology  (+) anemia ,   Anesthesia Other Findings   Reproductive/Obstetrics                             Anesthesia Physical  Anesthesia Plan  ASA: III  Anesthesia Plan: MAC   Post-op Pain Management:    Induction: Intravenous  PONV Risk Score and Plan: 3 and Ondansetron, Dexamethasone and Treatment may vary due to age or medical condition  Airway Management Planned: Natural Airway  Additional Equipment:   Intra-op Plan:   Post-operative Plan: Extubation in OR  Informed Consent: I have reviewed the patients History and Physical, chart, labs and discussed the procedure including the risks, benefits and alternatives for the proposed anesthesia with the patient or authorized representative who has indicated his/her understanding and acceptance.     Plan Discussed with: CRNA, Anesthesiologist and Surgeon  Anesthesia Plan Comments:         Anesthesia Quick Evaluation

## 2017-11-07 ENCOUNTER — Ambulatory Visit: Payer: Medicare Other | Admitting: Cardiovascular Disease

## 2017-11-07 ENCOUNTER — Encounter (HOSPITAL_COMMUNITY): Payer: Self-pay | Admitting: *Deleted

## 2017-11-07 ENCOUNTER — Inpatient Hospital Stay (HOSPITAL_COMMUNITY): Payer: Medicare Other | Admitting: Anesthesiology

## 2017-11-07 ENCOUNTER — Encounter (HOSPITAL_COMMUNITY)
Admission: EM | Disposition: A | Discharge status: Home / Self Care | Payer: Self-pay | Source: Home / Self Care | Attending: Internal Medicine

## 2017-11-07 DIAGNOSIS — K269 Duodenal ulcer, unspecified as acute or chronic, without hemorrhage or perforation: Secondary | ICD-10-CM

## 2017-11-07 DIAGNOSIS — K449 Diaphragmatic hernia without obstruction or gangrene: Secondary | ICD-10-CM

## 2017-11-07 HISTORY — PX: ESOPHAGOGASTRODUODENOSCOPY (EGD) WITH PROPOFOL: SHX5813

## 2017-11-07 LAB — COMPREHENSIVE METABOLIC PANEL
ALK PHOS: 74 U/L (ref 38–126)
ALT: 17 U/L (ref 14–54)
AST: 22 U/L (ref 15–41)
Albumin: 2.9 g/dL — ABNORMAL LOW (ref 3.5–5.0)
Anion gap: 10 (ref 5–15)
BILIRUBIN TOTAL: 0.9 mg/dL (ref 0.3–1.2)
BUN: 24 mg/dL — ABNORMAL HIGH (ref 6–20)
CALCIUM: 8.6 mg/dL — AB (ref 8.9–10.3)
CHLORIDE: 107 mmol/L (ref 101–111)
CO2: 24 mmol/L (ref 22–32)
CREATININE: 1.17 mg/dL — AB (ref 0.44–1.00)
GFR, EST AFRICAN AMERICAN: 53 mL/min — AB (ref >60)
GFR, EST NON AFRICAN AMERICAN: 45 mL/min — AB (ref >60)
Glucose, Bld: 87 mg/dL (ref 65–99)
Potassium: 3.5 mmol/L (ref 3.5–5.1)
Sodium: 141 mmol/L (ref 135–145)
TOTAL PROTEIN: 5.5 g/dL — AB (ref 6.5–8.1)

## 2017-11-07 LAB — CBC WITH DIFFERENTIAL/PLATELET
Basophils Absolute: 0 10*3/uL (ref 0.0–0.1)
Basophils Relative: 0 %
EOS ABS: 0.2 10*3/uL (ref 0.0–0.7)
EOS PCT: 4 %
HEMATOCRIT: 20.6 % — AB (ref 36.0–46.0)
Hemoglobin: 6.4 g/dL — CL (ref 12.0–15.0)
LYMPHS ABS: 0.6 10*3/uL (ref 0.7–4.0)
Lymphocytes Relative: 13 %
MCH: 27.4 pg (ref 26.0–34.0)
MCHC: 31.1 g/dL (ref 30.0–36.0)
MCV: 88 fL (ref 78.0–100.0)
Monocytes Absolute: 0.4 10*3/uL (ref 0.1–1.0)
Monocytes Relative: 9 %
Neutro Abs: 3.5 10*3/uL (ref 1.7–7.7)
Neutrophils Relative %: 74 %
PLATELETS: 119 10*3/uL — AB (ref 150–400)
RBC: 2.34 MIL/uL — ABNORMAL LOW (ref 3.87–5.11)
RDW: 15.3 % (ref 11.5–15.5)
WBC: 4.7 10*3/uL (ref 4.0–10.5)

## 2017-11-07 LAB — PREPARE RBC (CROSSMATCH)

## 2017-11-07 LAB — HEMOGLOBIN AND HEMATOCRIT, BLOOD
HCT: 33.9 % — ABNORMAL LOW (ref 36.0–46.0)
HEMATOCRIT: 36.4 % (ref 36.0–46.0)
Hemoglobin: 11.4 g/dL — ABNORMAL LOW (ref 12.0–15.0)
Hemoglobin: 12.1 g/dL (ref 12.0–15.0)

## 2017-11-07 LAB — PHOSPHORUS: PHOSPHORUS: 3.3 mg/dL (ref 2.5–4.6)

## 2017-11-07 LAB — MAGNESIUM: MAGNESIUM: 1.8 mg/dL (ref 1.7–2.4)

## 2017-11-07 SURGERY — ESOPHAGOGASTRODUODENOSCOPY (EGD) WITH PROPOFOL
Anesthesia: Monitor Anesthesia Care

## 2017-11-07 MED ORDER — PROPOFOL 10 MG/ML IV BOLUS
INTRAVENOUS | Status: DC | PRN
  Administered 2017-11-07: 30 mg via INTRAVENOUS
  Administered 2017-11-07: 20 mg via INTRAVENOUS
  Administered 2017-11-07 (×3): 30 mg via INTRAVENOUS
  Administered 2017-11-07: 40 mg via INTRAVENOUS
  Administered 2017-11-07 (×2): 30 mg via INTRAVENOUS

## 2017-11-07 MED ORDER — PROPOFOL 10 MG/ML IV BOLUS
INTRAVENOUS | Status: AC
  Filled 2017-11-07: qty 40

## 2017-11-07 MED ORDER — ACETAMINOPHEN 325 MG PO TABS
650.0000 mg | ORAL_TABLET | Freq: Four times a day (QID) | ORAL | Status: DC | PRN
  Administered 2017-11-07 – 2017-11-09 (×6): 650 mg via ORAL
  Filled 2017-11-07 (×7): qty 2

## 2017-11-07 MED ORDER — SODIUM CHLORIDE 0.9 % IV SOLN
8.0000 mg/h | INTRAVENOUS | Status: DC
  Administered 2017-11-07 – 2017-11-09 (×5): 8 mg/h via INTRAVENOUS
  Filled 2017-11-07 (×7): qty 80

## 2017-11-07 MED ORDER — HYDRALAZINE HCL 20 MG/ML IJ SOLN
10.0000 mg | Freq: Once | INTRAMUSCULAR | Status: AC
  Administered 2017-11-07: 10 mg via INTRAVENOUS
  Filled 2017-11-07: qty 1

## 2017-11-07 MED ORDER — PHENYLEPHRINE 40 MCG/ML (10ML) SYRINGE FOR IV PUSH (FOR BLOOD PRESSURE SUPPORT)
PREFILLED_SYRINGE | INTRAVENOUS | Status: DC | PRN
  Administered 2017-11-07: 80 ug via INTRAVENOUS

## 2017-11-07 MED ORDER — SODIUM CHLORIDE 0.9 % IV SOLN: INTRAVENOUS | Status: DC

## 2017-11-07 MED ORDER — PROPOFOL 10 MG/ML IV BOLUS
INTRAVENOUS | Status: AC
  Filled 2017-11-07: qty 80

## 2017-11-07 MED ORDER — PANTOPRAZOLE SODIUM 40 MG IV SOLR: 40.0000 mg | Freq: Two times a day (BID) | INTRAVENOUS | Status: DC

## 2017-11-07 MED ORDER — PROPOFOL 10 MG/ML IV BOLUS
INTRAVENOUS | Status: AC
  Filled 2017-11-07: qty 20

## 2017-11-07 MED ORDER — SODIUM CHLORIDE 0.9 % IV SOLN
Freq: Once | INTRAVENOUS | Status: AC
  Administered 2017-11-07: 06:00:00 via INTRAVENOUS

## 2017-11-07 MED ORDER — ACETAMINOPHEN 650 MG RE SUPP: 650.0000 mg | Freq: Four times a day (QID) | RECTAL | Status: DC | PRN

## 2017-11-07 MED ORDER — LIDOCAINE 2% (20 MG/ML) 5 ML SYRINGE
INTRAMUSCULAR | Status: DC | PRN
  Administered 2017-11-07: 60 mg via INTRAVENOUS

## 2017-11-07 SURGICAL SUPPLY — 15 items

## 2017-11-07 NOTE — Anesthesia Postprocedure Evaluation (Signed)
Anesthesia Post Note  Patient: Peggy Armstrong  Procedure(s) Performed: ESOPHAGOGASTRODUODENOSCOPY (EGD) WITH PROPOFOL (N/A )     Patient location during evaluation: PACU Anesthesia Type: MAC Level of consciousness: awake and alert Pain management: pain level controlled Vital Signs Assessment: post-procedure vital signs reviewed and stable Respiratory status: spontaneous breathing Cardiovascular status: stable Anesthetic complications: no    Last Vitals:  Vitals:   11/07/17 1130 11/07/17 1150  BP:  (!) 150/60  Pulse:  (!) 57  Resp:  20  Temp: 36.7 C 36.9 C  SpO2:  97%    Last Pain:  Vitals:   11/07/17 1150  TempSrc: Oral  PainSc:                  Nolon Nations

## 2017-11-07 NOTE — Interval H&P Note (Signed)
History and Physical Interval Note:  11/07/2017 7:21 AM  Peggy Armstrong  has presented today for surgery, with the diagnosis of Upper Gi Bleed  The various methods of treatment have been discussed with the patient and family. After consideration of risks, benefits and other options for treatment, the patient has consented to  Procedure(s): ESOPHAGOGASTRODUODENOSCOPY (EGD) WITH PROPOFOL (N/A) as a surgical intervention .  The patient's history has been reviewed, patient examined, no change in status, stable for surgery.  I have reviewed the patient's chart and labs.  Questions were answered to the patient's satisfaction.     [name]

## 2017-11-07 NOTE — Progress Notes (Signed)
PROGRESS NOTE    Peggy Armstrong  EHU:[id number] DOB: 05-19-1946 DOA: 11/04/2017 PCP: Thurman Coyer, MD   Brief Narrative:  Peggy Armstrong  is a 72 y.o. female, w Hypertension, ckd 3,  ? Aflutter, DVT, Gerd, Cancer of ampulla of vater, presents with black stool this am and then n/v later in the day x1 with coffee ground emesis  No bright red blood.  Pt just had biopsy 11/03/17 of her Ampulla of Vater at [facility].  She apparently called [facility] with her symptoms and was told to come to ER. Admitted for her Melena and bleeding suspected from Recent Biopsy site.  Oncology is been consulted and following and may do EGD in morning if hemoglobin hematocrit still continues to drop.  Hospitalization has been complicated by orthostatic hypotension all patient's antihypertensives have been stopped except the ones for rate control for her heart. It was found that patient continued to be bleeding and Hb/Hct dropped overnight to 6.4/20.6. She was taken for EGD this AM and transfused 2 units of pRBC's; EGD showed ulcer at the site of the recent duodenal periampullary polypectomy and a medium sized hiatal hernia.   Assessment & Plan:   Principal Problem:   Hematemesis Active Problems:   Atypical atrial flutter (HCC)   CKD (chronic kidney disease), stage III (HCC)   Anemia   Melena   Duodenal ulcer  Acute Melena with Coffee Ground Emesis likely 2/2 to GIB from Biopsy Site at Ampulla of Vater -Was on Full Liquid Diet and NPO at [place] for EGD this AM; Now on Clear Liquid Diet  -Type and scree in place and  transfusef 1 unit of pRBC's yesterday and will be given 2 units of pRBC's today as her Hb/Hct dropped overnight to 6.4/20.6; Repeat Hb/Hct this Evening was 12.1/36.4 -C/w Zofran 4mg  iv q6h prn -Protonix 80mg  iv x1 then 8mg / hr changed to po by Gastroenterology yesterday but now back IV Protonix gtt -[facility] following and appreciate further evaluation recommendations -Continue to monitor for  further signs of bleeding and she had a melanotic stool yesterday and because Hb/Hct dropped acutely EGD done this AM -EGD per Dr. Ardis Hughs showed "Medium sized hiatal hernia. Ulcer from recent duodenal periampulla  polypectomy: this is clearly the site of her recent UGI bleeding." Currently not bleeding and given the limitations of the typical ampullary landmarks being obliterated by previous polypectomies, Dr. Ginger Organ was uncertain that he would avoid the pancreatic duct, bile duct with clip or cautery.  -Per Gastroenterology continue IV PPI infusion and clear liquids for now as well as continue to transfuse as needed.  Patient to have a repeat CBC this evening and tomorrow a.m. and will need to continue to hold blood thinners  Normocytic Anemia with Acute Blood Loss Anemia -Globin hematocrit went from 11.1/34.9 and dropped all the way down to 6.4/20.6 -S/p Transfuse 1 unit of PRBCs already and will get 2 more -Follows up with Dr. Heath Lark for intravenous iron infusions -Continue to monitor signs and symptoms of bleeding and trend H&H every 6; Repeat Hb/Hct this Evening was 12.1/36.4  -Repeat CBC in a.m.  ? A Flutter -C/w Amiodarone 200 mg p.o. daily, as well as Diltiazem 120 mg p.o. daily -Continue with Telemetry -Continue to HOLD Eliquis for now given Bleeding   Right Leg DVT -Continue to HOLD Eliquis, do not resume since Melanotic Stool   Orthostatic Hypotension with a Hx of HTN -Chlorthalidone 25mg  po qday, Lasix 20mg  po bid, and  Losartan 50mg  po  qday stopped given her Orthostasis -Bolused 2 L of IV fluid and continue maintenance fluid at 75 mL's per hour -She has blood pressure keeps dropping and likely is from hypovolemia secondary to GI bleeding. Hb/Hct Dropped to 6.4/20.6 -Recheck orthostatic vital signs after adequately hydrated -PT OT to evaluate  Hypokalemia -Patient's potassium level was 2.9 and improved to 3.5 -Replete with IV potassium chloride 40 mEq  yesterday -Continue monitor and replete as necessary -Repeat CMP in AM  Hypomagnesemia -Patient's magnesium level was 1.6 improved to 1.8 -Replete with IV mag sulfate 2 g yesterday -Continue to monitor and replete as necessary -Repeat magnesium level in a.m.  CKD stage 3 -Patient's BUN/creatinine is stable currently.  On admission BUN/creatinine was 33/1.25 and is now 24/1.17 -Continue to hold nephrotoxic medications including Chlorthalidone, Losartan, and Furosemide given hypotension -Repeat CMP in a.m.  Depression and Anxiety -C/w Sertaline 25 mg po Daily and Alprazolam 0.25 mg po qHS   Cancer/Neoplasm of the Ampulla of Vater -[facility]ops[facility] had 1.5 cm polyp at the ampulla that was removed and biopsied -Continues to have follow-up between [facility] with the GI service with the [place] locally -Appreciate GI evaluation and follow-up. -Continue to monitor and follow GI recommendations  DVT prophylaxis: SCD's Code Status: FULL CODE Family Communication: Discussed with family at bedside  Disposition Plan: Remain Inpatient for work-up and treatment  Consultants:   Gastroenterology    Procedures: EGD Findings:      Medium sized hiatal hernia.      1.5-2cm non-bleeding, cratered ulcer in the region of the major papilla.       There was a small pigmented protuberance within the ulcer crater that       may represent a visible vessel. The mucosa surrounding the ulcer was       edematous, somewhat neoplastic appearing. I did not treat the ulcer       because it was not bleeding and the typical ampullary landmarks were       obliterated by previous polypectomies; I was was not certain that I       would avoid pancreatic duct, bile duct with clip or cautery.      The exam was otherwise without abnormality. Impression:               - Medium sized hiatal hernia.                           - Ulcer from recent duodenal periampulla                             polypectomy: this is clearly the site of her recent                            UGI bleeding. Currently not bleeding and given the                            limitations noted above I did not treat the ulcer.   Antimicrobials:  Anti-infectives (From admission, onward)   None     Subjective: Seen and examined at bedside and was feeling better.  Had a melanotic stool yesterday.  No chest pain, shortness breath, nausea, vomiting today.  Had just come back from the EGD and was happy that the  ulcer was not bleeding.  No other concerns or complaints at this time and is getting blood transfusions.  Objective: Vitals:   11/07/17 1100 11/07/17 1130 11/07/17 1150 11/07/17 1600  BP: (!) 154/61  (!) 150/60 (!) 169/85  Pulse: 60  (!) 57 (!) 56  Resp: (!) 26  20 (!) 21  Temp:  98 F (36.7 C) 98.5 F (36.9 C) 97.6 F (36.4 C)  TempSrc:  Oral Oral Oral  SpO2: 95%  97% 97%  Weight:      Height:        Intake/Output Summary (Last 24 hours) at 11/07/2017 1718 Last data filed at 11/07/2017 1600 Gross per 24 hour  Intake 3805.83 ml  Output 3400 ml  Net 405.83 ml   Filed Weights   11/06/17 0500 11/07/17 0500 11/07/17 0715  Weight: 113.5 kg (250 lb 3.6 oz) 120 kg (264 lb 8.8 oz) 120 kg (264 lb 8.8 oz)   Examination: Physical Exam:  Constitutional: Well-nourished, well-developed morbidly obese Caucasian female who is currently in no acute distress appears calm and has just come back from her upper endoscopy Eyes: Lids and conjunctive are normal.  Sclera are anicteric ENMT: External ears and nose appear normal.  Grossly normal hearing Neck: Supple with no JVD  Respiratory: Diminished to auscultation bilaterally with no appreciable wheezing, rales, rhonchi.  Patient is not tachypneic or using any accessory muscles to breathe Cardiovascular: Regular rate and rhythm.  No appreciable murmurs, rubs, gallops.  No extremity edema noted Abdomen: Soft, nontender, distended secondary to  body habitus.  Bowel sounds positive GU: Deferred Musculoskeletal: No contractures or cyanosis.  No joint deformities noted Skin: Warm and dry with no appreciable rashes or lesions on limited skin evaluation Neurologic: Cranial nerves II through XII grossly intact with no appreciable focal deficits Psychiatric: Normal judgment and insight.  Awake and alert and oriented x3.  Normal mood and affect  Data Reviewed: I have personally reviewed following labs and imaging studies  CBC: Recent Labs  Lab 11/05/17 1156 11/05/17 1803 11/06/17 0853 11/06/17 1440 11/06/17 2044 11/07/17 0332 11/07/17 1354  WBC 6.2 6.1 5.6 7.5  --  4.7  --   NEUTROABS  --   --  4.1 6.0  --  3.5  --   HGB 9.4* 9.7* 8.7* 9.5* 10.0* 6.4* 11.4*  HCT 29.3* 30.6* 27.3* 30.2* 30.4* 20.6* 33.9*  MCV 89.6 89.5 89.8 90.4  --  88.0  --   PLT 159 172 155 186  --  119*  --    Basic Metabolic Panel: Recent Labs  Lab 11/04/17 2240 11/05/17 0335 11/06/17 0853 11/07/17 0332  NA 143 141 143 141  K 3.7 3.5 2.9* 3.5  CL 103 104 103 107  CO2 28 27 27 24   GLUCOSE 110* 106* 83 87  BUN 33* 36* 36* 24*  CREATININE 1.25* 1.25* 1.31* 1.17*  CALCIUM 8.6* 8.4* 8.7* 8.6*  MG  --   --  1.6* 1.8  PHOS  --   --  3.6 3.3   GFR: Estimated Creatinine Clearance: 52.6 mL/min (A) (by C-G formula based on SCr of 1.17 mg/dL (H)). Liver Function Tests: Recent Labs  Lab 11/04/17 2240 11/05/17 0335 11/06/17 0853 11/07/17 0332  AST 16 15 14* 22  ALT 14 13* 13* 17  ALKPHOS 94 84 77 74  BILITOT 0.5 0.7 0.8 0.9  PROT 6.8 6.0* 5.9* 5.5*  ALBUMIN 3.4* 3.1* 3.0* 2.9*   No results for input(s): LIPASE, AMYLASE in the last 168 hours.  No results for input(s): AMMONIA in the last 168 hours. Coagulation Profile: Recent Labs  Lab 11/04/17 2240  INR 1.10   Cardiac Enzymes: No results for input(s): CKTOTAL, CKMB, CKMBINDEX, TROPONINI in the last 168 hours. BNP (last 3 results) No results for input(s): PROBNP in the last 8760  hours. HbA1C: No results for input(s): HGBA1C in the last 72 hours. CBG: No results for input(s): GLUCAP in the last 168 hours. Lipid Profile: No results for input(s): CHOL, HDL, LDLCALC, TRIG, CHOLHDL, LDLDIRECT in the last 72 hours. Thyroid Function Tests: No results for input(s): TSH, T4TOTAL, FREET4, T3FREE, THYROIDAB in the last 72 hours. Anemia Panel: No results for input(s): VITAMINB12, FOLATE, FERRITIN, TIBC, IRON, RETICCTPCT in the last 72 hours. Sepsis Labs: No results for input(s): PROCALCITON, LATICACIDVEN in the last 168 hours.  Recent Results (from the past 240 hour(s))  MRSA PCR Screening     Status: None   Collection Time: 11/05/17  2:02 AM  Result Value Ref Range Status   MRSA by PCR NEGATIVE NEGATIVE Final    Comment:        The GeneXpert MRSA Assay (FDA approved for NASAL specimens only), is one component of a comprehensive MRSA colonization surveillance program. It is not intended to diagnose MRSA infection nor to guide or monitor treatment for MRSA infections. Performed at [facility], [place] [address], [place], [place] [address]     Radiology Studies: No results found. Scheduled Meds: . allopurinol  300 mg Oral Daily  . ALPRAZolam  0.25 mg Oral QHS  . amiodarone  200 mg Oral Daily  . cholecalciferol  1,000 Units Oral Daily  . diltiazem  120 mg Oral Daily  . [START ON 11/10/2017] pantoprazole  40 mg Intravenous Q12H  . potassium chloride  40 mEq Oral Daily  . sertraline  25 mg Oral Daily  . vitamin B-12  1,000 mcg Oral Daily   Continuous Infusions: . sodium chloride 75 mL/hr at 11/07/17 1124  . pantoprozole (PROTONIX) infusion 8 mg/hr (11/07/17 1053)    LOS: 2 days   Kerney Elbe, DO [facility] Pager [phone]  If 7PM-7AM, please contact night-coverage [email or web address] Password [email or web address] 11/07/2017, 5:18 PM

## 2017-11-07 NOTE — Discharge Instructions (Signed)
Esophagogastroduodenoscopy, Care After °Refer to this sheet in the next few weeks. These instructions provide you with information about caring for yourself after your procedure. Your health care provider may also give you more specific instructions. Your treatment has been planned according to current medical practices, but problems sometimes occur. Call your health care provider if you have any problems or questions after your procedure. °What can I expect after the procedure? °After the procedure, it is common to have: °· A sore throat. °· Nausea. °· Bloating. °· Dizziness. °· Fatigue. ° °Follow these instructions at home: °· Do not eat or drink anything until the numbing medicine (local anesthetic) has worn off and your gag reflex has returned. You will know that the local anesthetic has worn off when you can swallow comfortably. °· Do not drive for 24 hours if you received a medicine to help you relax (sedative). °· If your health care provider took a tissue sample for testing during the procedure, make sure to get your test results. This is your responsibility. Ask your health care provider or the department performing the test when your results will be ready. °· Keep all follow-up visits as told by your health care provider. This is important. °Contact a health care provider if: °· You cannot stop coughing. °· You are not urinating. °· You are urinating less than usual. °Get help right away if: °· You have trouble swallowing. °· You cannot eat or drink. °· You have throat or chest pain that gets worse. °· You are dizzy or light-headed. °· You faint. °· You have nausea or vomiting. °· You have chills. °· You have a fever. °· You have severe abdominal pain. °· You have black, tarry, or bloody stools. °This information is not intended to replace advice given to you by your health care provider. Make sure you discuss any questions you have with your health care provider. °Document Released: 05/30/2012 Document  Revised: 11/19/2015 Document Reviewed: 05/07/2015 °Elsevier Interactive Patient Education © 2018 [employer] ° °

## 2017-11-07 NOTE — Op Note (Signed)
[facility] Patient Name: Peggy Armstrong Procedure Date: 11/07/2017 MRN: [id number] Attending MD: [name] , MD Date of Birth: 05-28-1946 CSN: [id number] Age: 72 Admit Type: Inpatient Procedure:                Upper GI endoscopy Indications:              Melena; 11/03/2017 periampullary duodenal                            polypectomy at [facility] Providers:                [name], MD, Cleda Daub, RN, Tinnie Gens, Technician Referring MD:              Medicines:                Monitored Anesthesia Care Complications:            No immediate complications. Estimated blood loss:                            None. Estimated Blood Loss:     Estimated blood loss: none. Procedure:                Pre-Anesthesia Assessment:                           - Prior to the procedure, a History and Physical                            was performed, and patient medications and                            allergies were reviewed. The patient's tolerance of                            previous anesthesia was also reviewed. The risks                            and benefits of the procedure and the sedation                            options and risks were discussed with the patient.                            All questions were answered, and informed consent                            was obtained. Prior Anticoagulants: The patient has                            taken Eliquis (apixaban), last dose was 7 days                            prior to procedure. ASA  Grade Assessment: IV - A                            patient with severe systemic disease that is a                            constant threat to life. After reviewing the risks                            and benefits, the patient was deemed in                            satisfactory condition to undergo the procedure.                           After obtaining informed consent, the endoscope was                        passed under direct vision. Throughout the                            procedure, the patient's blood pressure, pulse, and                            oxygen saturations were monitored continuously. The                            [id number]) scope was introduced through the                            mouth, and advanced to the second part of duodenum.                            The [id number] ([id number]) scope was introduced                            through the and advanced to the. The upper GI                            endoscopy was accomplished without difficulty. The                            patient tolerated the procedure well. Scope In: Scope Out: Findings:      Medium sized hiatal hernia.      1.5-2cm non-bleeding, cratered ulcer in the region of the major papilla.       There was a small pigmented protuberance within the ulcer crater that       may represent a visible vessel. The mucosa surrounding the ulcer was       edematous, somewhat neoplastic appearing. I did not treat the ulcer       because it was not bleeding and the typical ampullary landmarks were       obliterated by previous polypectomies; I was was not certain that I       would avoid pancreatic duct, bile  duct with clip or cautery.      The exam was otherwise without abnormality. Impression:               - Medium sized hiatal hernia.                           - Ulcer from recent duodenal periampulla                            polypectomy: this is clearly the site of her recent                            UGI bleeding. Currently not bleeding and given the                            limitations noted above I did not treat the ulcer. Moderate Sedation:      N/A- Per Anesthesia Care Recommendation:           - Return patient to hospital ward for ongoing care.                           - IV PPI infusion                           - Clear liquids for now.                           - Complete  her blood tranfusions ordered this                            morning.                           - CBC 6pm and tomorrow AM.                           - Continue holding blood thinners. Procedure Code(s):        --- Professional ---                           [phone], Esophagogastroduodenoscopy, flexible,                            transoral; diagnostic, including collection of                            specimen(s) by brushing or washing, when performed                            (separate procedure) Diagnosis Code(s):        --- Professional ---                           K26.9, Duodenal ulcer, unspecified as acute or  chronic, without hemorrhage or perforation                           K92.1, Melena (includes Hematochezia) CPT copyright 2017 American Medical Association. All rights reserved. The codes documented in this report are preliminary and upon coder review may  be revised to meet current compliance requirements. [name], MD 11/07/2017 8:25:50 AM This report has been signed electronically. Number of Addenda: 0

## 2017-11-07 NOTE — Transfer of Care (Signed)
Immediate Anesthesia Transfer of Care Note  Patient: Peggy Armstrong  Procedure(s) Performed: ESOPHAGOGASTRODUODENOSCOPY (EGD) WITH PROPOFOL (N/A )  Patient Location: Endoscopy Unit  Anesthesia Type:MAC  Level of Consciousness: awake, alert , oriented and patient cooperative  Airway & Oxygen Therapy: Patient Spontanous Breathing and Patient connected to face mask oxygen  Post-op Assessment: Report given to RN, Post -op Vital signs reviewed and stable and Patient moving all extremities  Post vital signs: Reviewed and stable  Last Vitals:  Vitals Value Taken Time  BP    Temp    Pulse 66 11/07/2017  8:20 AM  Resp 21 11/07/2017  8:20 AM  SpO2 96 % 11/07/2017  8:20 AM  Vitals shown include unvalidated device data.  Last Pain:  Vitals:   11/07/17 0715  TempSrc: Oral  PainSc: 0-No pain         Complications: No apparent anesthesia complications

## 2017-11-08 ENCOUNTER — Encounter (HOSPITAL_COMMUNITY): Payer: Self-pay | Admitting: Gastroenterology

## 2017-11-08 DIAGNOSIS — N183 Chronic kidney disease, stage 3 (moderate): Secondary | ICD-10-CM

## 2017-11-08 DIAGNOSIS — D649 Anemia, unspecified: Secondary | ICD-10-CM

## 2017-11-08 DIAGNOSIS — K920 Hematemesis: Secondary | ICD-10-CM

## 2017-11-08 DIAGNOSIS — I484 Atypical atrial flutter: Secondary | ICD-10-CM

## 2017-11-08 DIAGNOSIS — K921 Melena: Secondary | ICD-10-CM

## 2017-11-08 LAB — BPAM RBC
Blood Product Expiration Date: 201906042359
Blood Product Expiration Date: 201906042359
Blood Product Expiration Date: 201906042359
Blood Product Expiration Date: 201906042359
ISSUE DATE / TIME: 201905140544
ISSUE DATE / TIME: 201905140915
ISSUE DATE / TIME: 201905131544
UNIT TYPE AND RH: 5100
UNIT TYPE AND RH: 5100
Unit Type and Rh: 5100
Unit Type and Rh: 5100

## 2017-11-08 LAB — HEMOGLOBIN AND HEMATOCRIT, BLOOD
HCT: 34.9 % — ABNORMAL LOW (ref 36.0–46.0)
HCT: 35.4 % — ABNORMAL LOW (ref 36.0–46.0)
HEMATOCRIT: 36 % (ref 36.0–46.0)
HEMATOCRIT: 38.2 % (ref 36.0–46.0)
HEMOGLOBIN: 11.5 g/dL — AB (ref 12.0–15.0)
HEMOGLOBIN: 11.7 g/dL — AB (ref 12.0–15.0)
Hemoglobin: 12 g/dL (ref 12.0–15.0)
Hemoglobin: 12.8 g/dL (ref 12.0–15.0)

## 2017-11-08 LAB — BASIC METABOLIC PANEL
ANION GAP: 10 (ref 5–15)
BUN: 13 mg/dL (ref 6–20)
CO2: 23 mmol/L (ref 22–32)
Calcium: 8.8 mg/dL — ABNORMAL LOW (ref 8.9–10.3)
Chloride: 108 mmol/L (ref 101–111)
Creatinine, Ser: 1.14 mg/dL — ABNORMAL HIGH (ref 0.44–1.00)
GFR, EST AFRICAN AMERICAN: 54 mL/min — AB (ref >60)
GFR, EST NON AFRICAN AMERICAN: 47 mL/min — AB (ref >60)
Glucose, Bld: 83 mg/dL (ref 65–99)
POTASSIUM: 3.4 mmol/L — AB (ref 3.5–5.1)
SODIUM: 141 mmol/L (ref 135–145)

## 2017-11-08 LAB — TYPE AND SCREEN
ABO/RH(D): O POS
Antibody Screen: NEGATIVE
UNIT DIVISION: 0
UNIT DIVISION: 0
UNIT DIVISION: 0
Unit division: 0

## 2017-11-08 MED ORDER — HYDRALAZINE HCL 20 MG/ML IJ SOLN
10.0000 mg | Freq: Four times a day (QID) | INTRAMUSCULAR | Status: DC | PRN
  Administered 2017-11-08: 10 mg via INTRAVENOUS
  Filled 2017-11-08: qty 1

## 2017-11-08 MED ORDER — TRAMADOL HCL 50 MG PO TABS
50.0000 mg | ORAL_TABLET | Freq: Once | ORAL | Status: AC
  Administered 2017-11-08: 50 mg via ORAL
  Filled 2017-11-08: qty 1

## 2017-11-08 MED ORDER — ACETAMINOPHEN 325 MG PO TABS
650.0000 mg | ORAL_TABLET | Freq: Once | ORAL | Status: AC
  Administered 2017-11-08: 650 mg via ORAL

## 2017-11-08 MED ORDER — HYDRALAZINE HCL 20 MG/ML IJ SOLN: 5.0000 mg | Freq: Four times a day (QID) | INTRAMUSCULAR | Status: DC | PRN

## 2017-11-08 NOTE — Evaluation (Signed)
Physical Therapy Evaluation Patient Details Name: Peggy Armstrong MRN: [id number] DOB: 1946/05/19 Today's Date: 11/08/2017   History of Present Illness  72 y.o. female, w Hypertension, ckd 3,  ? Aflutter, DVT, Jerrye Bushy, cancer of ampulla of vater and admitted for Acute blood loss anemia/normocytic anemia/GI Beed/Acute melena with hematemesis likely secondary to GI bleed from recent biopsy site at the ampulla of Vator  Clinical Impression  Pt admitted with above diagnosis. Pt currently with functional limitations due to the deficits listed below (see PT Problem List).  Pt will benefit from skilled PT to increase their independence and safety with mobility to allow discharge to the venue listed below.  Pt reports feeling much better today and tolerated ambulating around unit well.  Pt feels close to her mobility baseline except for use of RW today (for safety since she has been in bed, hx of low blood this admission however received PRBCs today).  Pt typically uses cane for community ambulation however does have RW at home if needed.  Pt hopeful for d/c home tomorrow.     Follow Up Recommendations No PT follow up    Equipment Recommendations  None recommended by PT    Recommendations for Other Services       Precautions / Restrictions Precautions Precautions: Fall Restrictions Weight Bearing Restrictions: No      Mobility  Bed Mobility Overal bed mobility: Needs Assistance Bed Mobility: Supine to Sit     Supine to sit: HOB elevated     General bed mobility comments: provided a hand for pt to self assist trunk upright  Transfers Overall transfer level: Needs assistance Equipment used: Rolling walker (2 wheeled) Transfers: Sit to/from Stand Sit to Stand: Min guard         General transfer comment: verbal cues for hand placement with RW  Ambulation/Gait Ambulation/Gait assistance: Min guard Ambulation Distance (Feet): 360 Feet Assistive device: Rolling walker (2  wheeled) Gait Pattern/deviations: Step-through pattern;Decreased stride length     General Gait Details: verbal cues for slowing pace, HR and SpO2 WNL, RR 23-38 during session  Stairs            Wheelchair Mobility    Modified Rankin (Stroke Patients Only)       Balance Overall balance assessment: Needs assistance         Standing balance support: Bilateral upper extremity supported Standing balance-Leahy Scale: Poor Standing balance comment: steady with RW, denies hx of falls                             Pertinent Vitals/Pain Pain Assessment: No/denies pain    Home Living Family/patient expects to be discharged to:: Private residence Living Arrangements: Children(granddaughter helps pt most) Available Help at Discharge: Family Type of Home: House Home Access: Stairs to enter Entrance Stairs-Rails: None Entrance Stairs-Number of Steps: 1 Home Layout: One level Home Equipment: Environmental consultant - 2 wheels;Cane - single point      Prior Function Level of Independence: Independent with assistive device(s)         Comments: typically ambulates in home without assistive device but uses SPC for community     Hand Dominance        Extremity/Trunk Assessment        Lower Extremity Assessment Lower Extremity Assessment: Generalized weakness       Communication   Communication: No difficulties  Cognition Arousal/Alertness: Awake/alert Behavior During Therapy: WFL for tasks assessed/performed Overall Cognitive Status: Within  Functional Limits for tasks assessed                                        General Comments      Exercises     Assessment/Plan    PT Assessment Patient needs continued PT services  PT Problem List Decreased strength;Decreased mobility;Decreased knowledge of use of DME;Decreased activity tolerance       PT Treatment Interventions DME instruction;Therapeutic activities;Gait training;Therapeutic  exercise;Patient/family education;Functional mobility training    PT Goals (Current goals can be found in the Care Plan section)  Acute Rehab PT Goals PT Goal Formulation: With patient Time For Goal Achievement: 11/15/17 Potential to Achieve Goals: Good    Frequency Min 3X/week   Barriers to discharge        Co-evaluation               AM-PAC PT "6 Clicks" Daily Activity  Outcome Measure Difficulty turning over in bed (including adjusting bedclothes, sheets and blankets)?: A Little Difficulty moving from lying on back to sitting on the side of the bed? : A Little Difficulty sitting down on and standing up from a chair with arms (e.g., wheelchair, bedside commode, etc,.)?: A Little Help needed moving to and from a bed to chair (including a wheelchair)?: A Little Help needed walking in hospital room?: A Little Help needed climbing 3-5 steps with a railing? : A Little 6 Click Score: 18    End of Session Equipment Utilized During Treatment: Gait belt Activity Tolerance: Patient tolerated treatment well Patient left: in chair;with chair alarm set;with call bell/phone within reach Nurse Communication: Mobility status PT Visit Diagnosis: Other abnormalities of gait and mobility (R26.89)    Time: 3212-2482 PT Time Calculation (min) (ACUTE ONLY): 20 min   Charges:   PT Evaluation $PT Eval Low Complexity: 1 Low     PT G CodesCarmelia Bake, PT, DPT 11/08/2017 Pager: [phone]  York Ram E 11/08/2017, 3:51 PM

## 2017-11-08 NOTE — Progress Notes (Signed)
PROGRESS NOTE    DONETTE MAINWARING  BMW:[id number] DOB: 01-Mar-1946 DOA: 11/04/2017 PCP: Thurman Coyer, MD   Brief Narrative:  HPI On 11/05/2017 by Dr. Jani Gravel Peggy Armstrong  is a 72 y.o. female, w Hypertension, ckd 3,  ? Aflutter, DVT, Jerrye Bushy, cancer of ampulla of vater, presents with black stool this am and then n/v later in the day x1, ? Brown liquid.  No bright red blood.  Pt just had biopsy yesterday of her ampulla of vater at [facility].  She apparently called [facility] with her symptoms and was told to come to ER.   Interim history Admitted for her Melena and bleeding suspected from Recent Biopsy site.  Oncology is been consulted and following and may do EGD in morning if hemoglobin hematocrit still continues to drop.  Hospitalization has been complicated by orthostatic hypotension all patient's antihypertensives have been stopped except the ones for rate control for her heart. It was found that patient continued to be bleeding and Hb/Hct dropped overnight to 6.4/20.6. She was taken for EGD this AM and transfused 2 units of pRBC's; EGD showed ulcer at the site of the recent duodenal periampullary polypectomy and a medium sized hiatal hernia.  Assessment & Plan   Acute blood loss anemia/normocytic anemia/GI Beed/Acute melena with hematemesis  -likely secondary to GI bleed from recent biopsy site at the ampulla of Vator -Gastroenterology consulted and appreciated -Hemoglobin dropped to 6.4, patient received 3 units PRBC -Currently hemoglobin 12 -Status post EGD: Medium sized hiatal hernia.  Ulcer from recent duodenal.  Ampulla polypectomy: This is clearly the site of her recent UGI bleeding. -Currently on IV Protonix, which will continue for an additional 24 hours; if hemoglobin remains stable, will advance diet tomorrow afternoon -Patient follows with Dr. Alvy Bimler for intravenous iron infusions -Continue to monitor CBC  Questionable atrial flutter -Currently in sinus rhythm -Continue amiodarone,  diltiazem -Eliquis held due to bleeding  Right lower extremity DVT -hold Eliquis as above  Orthostatic hypotension -Prior to admission, suspect combination of blood loss as well as multiple blood pressure medications -Chlorthalidone, Lasix, losartan have been held -Patient was given IV fluids as well as blood transfusions, currently BP stable  Essential hypertension -Continue diltiazem -In the setting of anemia, will hold patient's other home medications and placed on IV hydralazine as needed  Hypokalemia -Potassium 3.4, will continue to replace and monitor BMP  Hypomagnesemia -resolved  Chronic kidney disease, stage III -Appears to be stable at baseline, continue to monitor BMP  Depression/anxiety -Continue sertraline, Xanax  Cancer/neoplasm of the ampulla of Vater -Recently had ERCP/biopsy at [facility], 1.5 cm polyp at the ampulla was removed and biopsied.  Patient will continue to follow-up with [facility] as well as a GI service locally  DVT Prophylaxis  SCDs  Code Status: Full  Family Communication: Family at bedside  Disposition Plan: Admitted.  Continue monitoring of hemoglobin, if stable will advance patient's diet on 11/09/2017.  Home in stable  Consultants Gastroenterology   Procedures  EGD  Antibiotics   Anti-infectives (From admission, onward)   None      Subjective:   [name] seen and examined today.  States she is feeling better.  Would like to have more than just a clear liquid diet.  Denies current chest pain, shortness of breath, abdominal pain, nausea or vomiting, is her headache.  Has not had any further bleeding.  Objective:   Vitals:   11/08/17 1200 11/08/17 1228 11/08/17 1300 11/08/17 1425  BP:  Marland Kitchen)  188/87 (!) 162/70   Pulse: (!) 58  (!) 59 (!) 58  Resp: 14  19 19   Temp:      TempSrc:      SpO2: 96%  96% 96%  Weight:      Height:        Intake/Output Summary (Last 24 hours) at 11/08/2017 1427 Last data filed at  11/08/2017 1400 Gross per 24 hour  Intake 2917.92 ml  Output 4300 ml  Net -1382.08 ml   Filed Weights   11/06/17 0500 11/07/17 0500 11/07/17 0715  Weight: 113.5 kg (250 lb 3.6 oz) 120 kg (264 lb 8.8 oz) 120 kg (264 lb 8.8 oz)    Exam  General: Well developed, well nourished, NAD, appears stated age  HEENT: NCAT, mucous membranes moist.   Neck: Supple  Cardiovascular: S1 S2 auscultated, no rubs, murmurs or gallops. Regular rate and rhythm.  Respiratory: Clear to auscultation bilaterally with equal chest rise  Abdomen: Soft, obese, nontender, nondistended, + bowel sounds  Extremities: warm dry without cyanosis clubbing or edema  Neuro: AAOx3, nonfocal  Psych: Normal affect and demeanor with intact judgement and insight   Data Reviewed: I have personally reviewed following labs and imaging studies  CBC: Recent Labs  Lab 11/05/17 1156 11/05/17 1803 11/06/17 0853 11/06/17 1440  11/07/17 0332 11/07/17 1354 11/07/17 1949 11/08/17 0201 11/08/17 0805  WBC 6.2 6.1 5.6 7.5  --  4.7  --   --   --   --   NEUTROABS  --   --  4.1 6.0  --  3.5  --   --   --   --   HGB 9.4* 9.7* 8.7* 9.5*   < > 6.4* 11.4* 12.1 11.5* 12.0  HCT 29.3* 30.6* 27.3* 30.2*   < > 20.6* 33.9* 36.4 34.9* 36.0  MCV 89.6 89.5 89.8 90.4  --  88.0  --   --   --   --   PLT 159 172 155 186  --  119*  --   --   --   --    < > = values in this interval not displayed.   Basic Metabolic Panel: Recent Labs  Lab 11/04/17 2240 11/05/17 0335 11/06/17 0853 11/07/17 0332 11/08/17 0805  NA 143 141 143 141 141  K 3.7 3.5 2.9* 3.5 3.4*  CL 103 104 103 107 108  CO2 28 27 27 24 23   GLUCOSE 110* 106* 83 87 83  BUN 33* 36* 36* 24* 13  CREATININE 1.25* 1.25* 1.31* 1.17* 1.14*  CALCIUM 8.6* 8.4* 8.7* 8.6* 8.8*  MG  --   --  1.6* 1.8  --   PHOS  --   --  3.6 3.3  --    GFR: Estimated Creatinine Clearance: 54 mL/min (A) (by C-G formula based on SCr of 1.14 mg/dL (H)). Liver Function Tests: Recent Labs  Lab  11/04/17 2240 11/05/17 0335 11/06/17 0853 11/07/17 0332  AST 16 15 14* 22  ALT 14 13* 13* 17  ALKPHOS 94 84 77 74  BILITOT 0.5 0.7 0.8 0.9  PROT 6.8 6.0* 5.9* 5.5*  ALBUMIN 3.4* 3.1* 3.0* 2.9*   No results for input(s): LIPASE, AMYLASE in the last 168 hours. No results for input(s): AMMONIA in the last 168 hours. Coagulation Profile: Recent Labs  Lab 11/04/17 2240  INR 1.10   Cardiac Enzymes: No results for input(s): CKTOTAL, CKMB, CKMBINDEX, TROPONINI in the last 168 hours. BNP (last 3 results) No results for input(s): PROBNP in  the last 8760 hours. HbA1C: No results for input(s): HGBA1C in the last 72 hours. CBG: No results for input(s): GLUCAP in the last 168 hours. Lipid Profile: No results for input(s): CHOL, HDL, LDLCALC, TRIG, CHOLHDL, LDLDIRECT in the last 72 hours. Thyroid Function Tests: No results for input(s): TSH, T4TOTAL, FREET4, T3FREE, THYROIDAB in the last 72 hours. Anemia Panel: No results for input(s): VITAMINB12, FOLATE, FERRITIN, TIBC, IRON, RETICCTPCT in the last 72 hours. Urine analysis:    Component Value Date/Time   COLORURINE AMBER (A) 03/22/2017 1530   APPEARANCEUR HAZY (A) 03/22/2017 1530   LABSPEC 1.017 03/22/2017 1530   PHURINE 5.0 03/22/2017 1530   GLUCOSEU NEGATIVE 03/22/2017 1530   HGBUR NEGATIVE 03/22/2017 1530   BILIRUBINUR NEGATIVE 03/22/2017 1530   [place] 03/22/2017 1530   PROTEINUR NEGATIVE 03/22/2017 1530   NITRITE NEGATIVE 03/22/2017 1530   LEUKOCYTESUR NEGATIVE 03/22/2017 1530   Sepsis Labs: @LABRCNTIP (procalcitonin:4,lacticidven:4)  ) Recent Results (from the past 240 hour(s))  MRSA PCR Screening     Status: None   Collection Time: 11/05/17  2:02 AM  Result Value Ref Range Status   MRSA by PCR NEGATIVE NEGATIVE Final    Comment:        The GeneXpert MRSA Assay (FDA approved for NASAL specimens only), is one component of a comprehensive MRSA colonization surveillance program. It is not intended to  diagnose MRSA infection nor to guide or monitor treatment for MRSA infections. Performed at [facility], [place] [address], [place], [place] [address]       Radiology Studies: No results found.   Scheduled Meds: . allopurinol  300 mg Oral Daily  . ALPRAZolam  0.25 mg Oral QHS  . amiodarone  200 mg Oral Daily  . cholecalciferol  1,000 Units Oral Daily  . diltiazem  120 mg Oral Daily  . [START ON 11/10/2017] pantoprazole  40 mg Intravenous Q12H  . potassium chloride  40 mEq Oral Daily  . sertraline  25 mg Oral Daily  . vitamin B-12  1,000 mcg Oral Daily   Continuous Infusions: . sodium chloride 75 mL/hr at 11/08/17 1400  . pantoprozole (PROTONIX) infusion 8 mg/hr (11/08/17 1400)     LOS: 3 days   Time Spent in minutes   45 minutes  Staysha Truby D.O. on 11/08/2017 at 2:27 PM  Between 7am to 7pm - Pager - [phone]  After 7pm go to [email or web address] - password [email or web address]  And look for the night coverage person covering for me after hours  [facility] Office  [phone]

## 2017-11-08 NOTE — Progress Notes (Signed)
    Progress Note   Subjective  Chief Complaint: Upper GI Bleed  This morning found with grand-daughter by her side. Reports feeling well. No stools at all since Monday, no abdominal pain. No concerns.    Objective   Vital signs in last 24 hours: Temp:  [97.6 F (36.4 C)-98.6 F (37 C)] 98.2 F (36.8 C) (05/15 0800) Pulse Rate:  [56-65] 58 (05/15 0800) Resp:  [14-33] 14 (05/15 0800) BP: (136-208)/(47-96) 192/85 (05/15 0800) SpO2:  [92 %-97 %] 96 % (05/15 0800) Last BM Date: 11/06/17 General:    Overweight white female in NAD Heart:  Regular rate and rhythm; no murmurs Lungs: Respirations even and unlabored, lungs CTA bilaterally Abdomen:  Soft, nontender and nondistended. Normal bowel sounds. Extremities:  Without edema. Neurologic:  Alert and oriented,  grossly normal neurologically. Psych:  Cooperative. Normal mood and affect.  Intake/Output from previous day: 05/14 0701 - 05/15 0700 In: 3621.7 [P.O.:240; I.V.:2751.7; Blood:630] Out: 3400 [Urine:3400] Intake/Output this shift: Total I/O In: 400 [P.O.:200; I.V.:200] Out: -   Lab Results: Recent Labs    11/06/17 0853 11/06/17 1440  11/07/17 0332  11/07/17 1949 11/08/17 0201 11/08/17 0805  WBC 5.6 7.5  --  4.7  --   --   --   --   HGB 8.7* 9.5*   < > 6.4*   < > 12.1 11.5* 12.0  HCT 27.3* 30.2*   < > 20.6*   < > 36.4 34.9* 36.0  PLT 155 186  --  119*  --   --   --   --    < > = values in this interval not displayed.   BMET Recent Labs    11/06/17 0853 11/07/17 0332 11/08/17 0805  NA 143 141 141  K 2.9* 3.5 3.4*  CL 103 107 108  CO2 27 24 23   GLUCOSE 83 87 83  BUN 36* 24* 13  CREATININE 1.31* 1.17* 1.14*  CALCIUM 8.7* 8.6* 8.8*   LFT Recent Labs    11/07/17 0332  PROT 5.5*  ALBUMIN 2.9*  AST 22  ALT 17  ALKPHOS 74  BILITOT 0.9     Assessment / Plan:   Assessment: 1. UGI Bleeding: most certainly bleeding from ulcer witnessed at region of major papilla via EGD 11/07/17, has received 3 u  prbcs and hgb stable currently, no further signs of bleeding overnight  2. H/o large ampullary neoplasm  Plan: 1. Increasing diet to full liquids for lunch today 2. Will decrease hgb check to q8h, if next check ok, could back off to q12 3. Continue to monitor hgb with transfusion as needed 4. Continue Protonix infusion for now 5. Continue to hold blood thinner 6. Please await any further recommendations from Dr. Ardis Hughs later today  Thank you for your kind consultation, we will continue to follow along.    LOS: 3 days   Lavone Nian [facility]  11/08/2017, 9:09 AM  Pager # [phone]

## 2017-11-09 DIAGNOSIS — K91840 Postprocedural hemorrhage and hematoma of a digestive system organ or structure following a digestive system procedure: Principal | ICD-10-CM

## 2017-11-09 DIAGNOSIS — E876 Hypokalemia: Secondary | ICD-10-CM

## 2017-11-09 LAB — BASIC METABOLIC PANEL
ANION GAP: 10 (ref 5–15)
BUN: 12 mg/dL (ref 6–20)
CALCIUM: 8.5 mg/dL — AB (ref 8.9–10.3)
CO2: 25 mmol/L (ref 22–32)
CREATININE: 1.1 mg/dL — AB (ref 0.44–1.00)
Chloride: 109 mmol/L (ref 101–111)
GFR calc Af Amer: 57 mL/min — ABNORMAL LOW (ref >60)
GFR, EST NON AFRICAN AMERICAN: 49 mL/min — AB (ref >60)
GLUCOSE: 84 mg/dL (ref 65–99)
Potassium: 3.3 mmol/L — ABNORMAL LOW (ref 3.5–5.1)
Sodium: 144 mmol/L (ref 135–145)

## 2017-11-09 LAB — MAGNESIUM
Magnesium: 1.4 mg/dL — ABNORMAL LOW (ref 1.7–2.4)
Magnesium: 2.7 mg/dL — ABNORMAL HIGH (ref 1.7–2.4)

## 2017-11-09 LAB — POTASSIUM: Potassium: 3.5 mmol/L (ref 3.5–5.1)

## 2017-11-09 LAB — HEMOGLOBIN AND HEMATOCRIT, BLOOD
HEMATOCRIT: 34.6 % — AB (ref 36.0–46.0)
Hemoglobin: 11.4 g/dL — ABNORMAL LOW (ref 12.0–15.0)

## 2017-11-09 MED ORDER — APIXABAN 5 MG PO TABS: 5.0000 mg | ORAL_TABLET | Freq: Two times a day (BID) | ORAL | Status: DC

## 2017-11-09 MED ORDER — CHLORTHALIDONE 25 MG PO TABS
25.0000 mg | ORAL_TABLET | Freq: Every day | ORAL | Status: DC
  Administered 2017-11-09: 25 mg via ORAL
  Filled 2017-11-09: qty 1

## 2017-11-09 MED ORDER — PANTOPRAZOLE SODIUM 40 MG PO TBEC: 40.0000 mg | DELAYED_RELEASE_TABLET | Freq: Two times a day (BID) | ORAL | 0 refills | Status: DC

## 2017-11-09 MED ORDER — LOSARTAN POTASSIUM 50 MG PO TABS
50.0000 mg | ORAL_TABLET | Freq: Every day | ORAL | Status: DC
  Administered 2017-11-09: 50 mg via ORAL
  Filled 2017-11-09: qty 1

## 2017-11-09 MED ORDER — BUTALBITAL-APAP-CAFFEINE 50-325-40 MG PO TABS
1.0000 | ORAL_TABLET | ORAL | Status: AC | PRN
  Administered 2017-11-09 (×2): 1 via ORAL
  Filled 2017-11-09 (×2): qty 1

## 2017-11-09 MED ORDER — FUROSEMIDE 20 MG PO TABS
20.0000 mg | ORAL_TABLET | Freq: Two times a day (BID) | ORAL | Status: DC
  Administered 2017-11-09: 20 mg via ORAL
  Filled 2017-11-09: qty 1

## 2017-11-09 MED ORDER — FUROSEMIDE 20 MG PO TABS: 20.0000 mg | ORAL_TABLET | Freq: Two times a day (BID) | ORAL | Status: DC

## 2017-11-09 MED ORDER — PANTOPRAZOLE SODIUM 40 MG PO TBEC
40.0000 mg | DELAYED_RELEASE_TABLET | Freq: Two times a day (BID) | ORAL | Status: DC
  Administered 2017-11-09: 40 mg via ORAL
  Filled 2017-11-09: qty 1

## 2017-11-09 MED ORDER — MAGNESIUM SULFATE 4 GM/100ML IV SOLN
4.0000 g | Freq: Once | INTRAVENOUS | Status: AC
  Administered 2017-11-09: 4 g via INTRAVENOUS
  Filled 2017-11-09: qty 100

## 2017-11-09 NOTE — Progress Notes (Signed)
PROGRESS NOTE    Peggy Armstrong  POE:[id number] DOB: 08-30-1945 DOA: 11/04/2017 PCP: Thurman Coyer, MD   Brief Narrative:  HPI On 11/05/2017 by Dr. Jani Gravel Peggy Armstrong  is a 72 y.o. female, w Hypertension, ckd 3,  ? Aflutter, DVT, Jerrye Bushy, cancer of ampulla of vater, presents with black stool this am and then n/v later in the day x1, ? Brown liquid.  No bright red blood.  Pt just had biopsy yesterday of her ampulla of vater at [facility].  She apparently called [facility] with her symptoms and was told to come to ER.   Interim history Admitted for her Melena and bleeding suspected from Recent Biopsy site.  Oncology is been consulted and following and may do EGD in morning if hemoglobin hematocrit still continues to drop.  Hospitalization has been complicated by orthostatic hypotension all patient's antihypertensives have been stopped except the ones for rate control for her heart. It was found that patient continued to be bleeding and Hb/Hct dropped overnight to 6.4/20.6. She was taken for EGD this AM and transfused 2 units of pRBC's; EGD showed ulcer at the site of the recent duodenal periampullary polypectomy and a medium sized hiatal hernia.  Assessment & Plan   Acute blood loss anemia/normocytic anemia/GI Beed/Acute melena with hematemesis  -likely secondary to GI bleed from recent biopsy site at the ampulla of Vator -Gastroenterology consulted and appreciated -Hemoglobin dropped to 6.4, patient received 3 units PRBC -Currently hemoglobin 11.4 -Status post EGD: Medium sized hiatal hernia.  Ulcer from recent duodenal.  Ampulla polypectomy: This is clearly the site of her recent UGI bleeding. -Currently on IV Protonix, which will continue for an additional 24 hours; if hemoglobin remains stable, will advance diet tomorrow afternoon -Patient follows with Dr. Alvy Bimler for intravenous iron infusions -Continue to monitor CBC  Questionable atrial flutter -Currently in sinus rhythm -Continue  amiodarone, diltiazem -Eliquis held due to bleeding- per GI restart on Saturday  Right lower extremity DVT -hold Eliquis as above  Orthostatic hypotension -Prior to admission, suspect combination of blood loss as well as multiple blood pressure medications -Chlorthalidone, Lasix, losartan have been held -Patient was given IV fluids as well as blood transfusions, currently BP stable  Essential hypertension -Continue diltiazem -In the setting of anemia, will hold patient's other home medications and placed on IV hydralazine as needed  Hypokalemia -Potassium 3.3, will continue to replace and monitor BMP  Hypomagnesemia -Magnesium 1.4, will replace and continue to monitor   Chronic kidney disease, stage III -Appears to be stable at baseline, continue to monitor BMP  Depression/anxiety -Continue sertraline, Xanax  Cancer/neoplasm of the ampulla of Vater -Recently had ERCP/biopsy at [facility], 1.5 cm polyp at the ampulla was removed and biopsied.  Patient will continue to follow-up with [facility] as well as a GI service locally  DVT Prophylaxis  SCDs  Code Status: Full  Family Communication: Family at bedside  Disposition Plan: Admitted.  Continue monitoring of hemoglobin, if stable will advance patient's diet on 11/09/2017.  Home in stable  Consultants Gastroenterology   Procedures  EGD  Antibiotics   Anti-infectives (From admission, onward)   None      Subjective:   Peggy Armstrong seen and examined today. States she is feeling better. No longer having abdominal pain, nausea, vomiting. Denies chest pain, shortness of breath, headache, dizziness. Worried about her blood pressure.   Objective:   Vitals:   11/09/17 0600 11/09/17 0735 11/09/17 0800 11/09/17 1000  BP: (!) 170/81  Marland Kitchen)  183/86 (!) 194/91  Pulse: (!) 58  73 65  Resp: 19  14 (!) 25  Temp:  97.8 F (36.6 C)    TempSrc:  Oral    SpO2: 91%  94% 96%  Weight:      Height:        Intake/Output  Summary (Last 24 hours) at 11/09/2017 1051 Last data filed at 11/09/2017 0954 Gross per 24 hour  Intake 1746.67 ml  Output 1825 ml  Net -78.33 ml   Filed Weights   11/06/17 0500 11/07/17 0500 11/07/17 0715  Weight: 113.5 kg (250 lb 3.6 oz) 120 kg (264 lb 8.8 oz) 120 kg (264 lb 8.8 oz)   Exam  General: Well developed, well nourished, NAD, appears stated age  HEENT: NCAT, mucous membranes moist.   Neck: Supple  Cardiovascular: S1 S2 auscultated, no rubs, murmurs or gallops.   Respiratory: Clear to auscultation bilaterally with equal chest rise  Abdomen: Soft, nontender, nondistended, + bowel sounds  Extremities: warm dry without cyanosis clubbing or edema  Neuro: AAOx3, nonfocal  Skin: Without rashes exudates or nodules  Psych: Normal affect and demeanor with intact judgement and insight    Data Reviewed: I have personally reviewed following labs and imaging studies  CBC: Recent Labs  Lab 11/05/17 1156 11/05/17 1803 11/06/17 0853 11/06/17 1440  11/07/17 0332  11/08/17 0201 11/08/17 0805 11/08/17 1612 11/08/17 2247 11/09/17 0716  WBC 6.2 6.1 5.6 7.5  --  4.7  --   --   --   --   --   --   NEUTROABS  --   --  4.1 6.0  --  3.5  --   --   --   --   --   --   HGB 9.4* 9.7* 8.7* 9.5*   < > 6.4*   < > 11.5* 12.0 12.8 11.7* 11.4*  HCT 29.3* 30.6* 27.3* 30.2*   < > 20.6*   < > 34.9* 36.0 38.2 35.4* 34.6*  MCV 89.6 89.5 89.8 90.4  --  88.0  --   --   --   --   --   --   PLT 159 172 155 186  --  119*  --   --   --   --   --   --    < > = values in this interval not displayed.   Basic Metabolic Panel: Recent Labs  Lab 11/05/17 0335 11/06/17 0853 11/07/17 0332 11/08/17 0805 11/09/17 0312  NA 141 143 141 141 144  K 3.5 2.9* 3.5 3.4* 3.3*  CL 104 103 107 108 109  CO2 27 27 24 23 25   GLUCOSE 106* 83 87 83 84  BUN 36* 36* 24* 13 12  CREATININE 1.25* 1.31* 1.17* 1.14* 1.10*  CALCIUM 8.4* 8.7* 8.6* 8.8* 8.5*  MG  --  1.6* 1.8  --  1.4*  PHOS  --  3.6 3.3  --   --      GFR: Estimated Creatinine Clearance: 56 mL/min (A) (by C-G formula based on SCr of 1.1 mg/dL (H)). Liver Function Tests: Recent Labs  Lab 11/04/17 2240 11/05/17 0335 11/06/17 0853 11/07/17 0332  AST 16 15 14* 22  ALT 14 13* 13* 17  ALKPHOS 94 84 77 74  BILITOT 0.5 0.7 0.8 0.9  PROT 6.8 6.0* 5.9* 5.5*  ALBUMIN 3.4* 3.1* 3.0* 2.9*   No results for input(s): LIPASE, AMYLASE in the last 168 hours. No results for input(s): AMMONIA in the last  168 hours. Coagulation Profile: Recent Labs  Lab 11/04/17 2240  INR 1.10   Cardiac Enzymes: No results for input(s): CKTOTAL, CKMB, CKMBINDEX, TROPONINI in the last 168 hours. BNP (last 3 results) No results for input(s): PROBNP in the last 8760 hours. HbA1C: No results for input(s): HGBA1C in the last 72 hours. CBG: No results for input(s): GLUCAP in the last 168 hours. Lipid Profile: No results for input(s): CHOL, HDL, LDLCALC, TRIG, CHOLHDL, LDLDIRECT in the last 72 hours. Thyroid Function Tests: No results for input(s): TSH, T4TOTAL, FREET4, T3FREE, THYROIDAB in the last 72 hours. Anemia Panel: No results for input(s): VITAMINB12, FOLATE, FERRITIN, TIBC, IRON, RETICCTPCT in the last 72 hours. Urine analysis:    Component Value Date/Time   COLORURINE AMBER (A) 03/22/2017 1530   APPEARANCEUR HAZY (A) 03/22/2017 1530   LABSPEC 1.017 03/22/2017 1530   PHURINE 5.0 03/22/2017 1530   GLUCOSEU NEGATIVE 03/22/2017 1530   HGBUR NEGATIVE 03/22/2017 1530   BILIRUBINUR NEGATIVE 03/22/2017 1530   [place] 03/22/2017 1530   PROTEINUR NEGATIVE 03/22/2017 1530   NITRITE NEGATIVE 03/22/2017 1530   LEUKOCYTESUR NEGATIVE 03/22/2017 1530   Sepsis Labs: @LABRCNTIP (procalcitonin:4,lacticidven:4)  ) Recent Results (from the past 240 hour(s))  MRSA PCR Screening     Status: None   Collection Time: 11/05/17  2:02 AM  Result Value Ref Range Status   MRSA by PCR NEGATIVE NEGATIVE Final    Comment:        The GeneXpert MRSA Assay  (FDA approved for NASAL specimens only), is one component of a comprehensive MRSA colonization surveillance program. It is not intended to diagnose MRSA infection nor to guide or monitor treatment for MRSA infections. Performed at [facility], [place] [address], [place], [place] [address]       Radiology Studies: No results found.   Scheduled Meds: . allopurinol  300 mg Oral Daily  . ALPRAZolam  0.25 mg Oral QHS  . amiodarone  200 mg Oral Daily  . cholecalciferol  1,000 Units Oral Daily  . diltiazem  120 mg Oral Daily  . losartan  50 mg Oral Daily  . pantoprazole  40 mg Oral BID AC  . potassium chloride  40 mEq Oral Daily  . sertraline  25 mg Oral Daily  . vitamin B-12  1,000 mcg Oral Daily   Continuous Infusions: . sodium chloride 75 mL/hr at 11/08/17 1400     LOS: 4 days   Time Spent in minutes   45 minutes  Zoey Bidwell D.O. on 11/09/2017 at 10:51 AM  Between 7am to 7pm - Pager - [phone]  After 7pm go to [email or web address] - password [email or web address]  And look for the night coverage person covering for me after hours  [facility] Office  [phone]

## 2017-11-09 NOTE — Progress Notes (Signed)
Patient ready for discharge home with family. Reviewed all discharge information including prescriptions and f/u appointments.

## 2017-11-09 NOTE — Discharge Summary (Signed)
Physician Discharge Summary  Peggy Armstrong MWN:[id number] DOB: [birth year] DOA: 11/04/2017  PCP: Peggy Coyer, MD  Admit date: 11/04/2017 Discharge date: 11/09/2017  Time spent: 45 minutes  Recommendations for Outpatient Follow-up:  Patient will be discharged to home.  Patient will need to follow up with primary care provider within one week of discharge, repeat CBC, BMP, magnesium.  Follow up with Peggy Armstrong. Ardis Armstrong, gastroenterology, in 2-3 weeks. Patient should continue medications as prescribed.  Patient should follow a heart healthy diet.   Discharge Diagnoses:  Acute blood loss anemia/normocytic anemia/GI Beed/Acute melena with hematemesis  Questionable atrial flutter Right lower extremity DVT Orthostatic hypotension Essential hypertension Hypokalemia Hypomagnesemia Chronic kidney disease, stage III Depression/anxiety Cancer/neoplasm of the ampulla of Vater  Discharge Condition: stable  Diet recommendation: heart healthy  Filed Weights   11/06/17 0500 11/07/17 0500 11/07/17 0715  Weight: 113.5 kg (250 lb 3.6 oz) 120 kg (264 lb 8.8 oz) 120 kg (264 lb 8.8 oz)    History of present illness:  On 11/05/2017 by Peggy Armstrong. Jani Gravel PatriciaMalloyis a72 y.o.female,w Hypertension, ckd 3, ? Aflutter, DVT, [facility], cancer of ampulla of vater, presents with black stool this am and then n/v later in the day x1, ? Brown liquid. No bright red blood. Pt just had biopsy yesterday of her ampulla of vater at [facility]. She apparently called [facility] with her symptoms and was told to come to ER.   Hospital Course:  Acute blood loss anemia/normocytic anemia/GI Beed/Acute melena with hematemesis  -likely secondary to GI bleed from recent biopsy site at the ampulla of Vator -Gastroenterology consulted and appreciated -Hemoglobin dropped to 6.4, patient received 3 units PRBC -Currently hemoglobin 11.4 -Status post EGD: Medium sized hiatal hernia.  Ulcer from recent duodenal.  Ampulla polypectomy: This is  clearly the site of her recent UGI bleeding. -Currently on IV Protonix, which will continue for an additional 24 hours; if hemoglobin remains stable, will advance diet tomorrow afternoon -Patient follows with Peggy Armstrong. Alvy Armstrong for intravenous iron infusions  Questionable atrial flutter -Currently in sinus rhythm -Continue amiodarone, diltiazem -Eliquis held due to bleeding- per GI restart on Saturday  Right lower extremity DVT -hold Eliquis as above  Orthostatic hypotension -Prior to admission, suspect combination of blood loss as well as multiple blood pressure medications -Chlorthalidone, Lasix, losartan were held -BP stable now  Essential hypertension -Continue diltiazem, lasix, losartan, chlorthalidone  Hypokalemia -Potassium 3.5 after replacement -repeat in one week  Hypomagnesemia -Magnesium 2.7 after replacement -Repeat in one week  Chronic kidney disease, stage III -Appears to be stable at baseline, currently 1.1  Depression/anxiety -Continue sertraline, Xanax  Cancer/neoplasm of the ampulla of Vater -Recently had ERCP/biopsy at [facility], 1.5 cm polyp at the ampulla was removed and biopsied.  Patient will continue to follow-up with [facility] as well as a GI service locally  Consultants Gastroenterology   Procedures  EGD  Discharge Exam: Vitals:   11/09/17 1400 11/09/17 1535  BP: (!) 148/75   Pulse: (!) 56   Resp: (!) 27   Temp:  (!) 97.5 F (36.4 C)  SpO2: 96%    Better today.  Denies current chest pain, shortness breath, abdominal pain, nausea vomiting, diarrhea or constipation.  Did have a dark bowel movement this morning.   General: Well developed, well nourished, NAD, appears stated age  HEENT: NCAT, mucous membranes moist.  Neck: Supple  Cardiovascular: S1 S2 auscultated, RRR, no murmur  Respiratory: Clear to auscultation bilaterally with equal chest rise  Abdomen: Soft, obese, nontender,  nondistended, + bowel  sounds  Extremities: warm dry without cyanosis clubbing or edema  Neuro: AAOx3, nonfocal  Psych: Normal affect and demeanor with intact judgement and insight  Discharge Instructions Discharge Instructions    Discharge instructions   Complete by:  As directed    Patient will be discharged to home.  Patient will need to follow up with primary care provider within one week of discharge, repeat CBC, BMP, magnesium.  Follow up with Peggy Armstrong. Ardis Armstrong, gastroenterology, in 2-3 weeks. Patient should continue medications as prescribed.  Patient should follow a heart healthy diet.  Restart Eliquis on 11/11/2017.     Allergies as of 11/09/2017      Reactions   Codeine Nausea And Vomiting   Made "very sick"      Medication List    TAKE these medications   allopurinol 300 MG tablet Commonly known as:  ZYLOPRIM Take 300 mg by mouth daily.   ALPRAZolam 0.25 MG tablet Commonly known as:  XANAX Take 0.25 mg by mouth at bedtime.   amiodarone 200 MG tablet Commonly known as:  PACERONE TAKE 1 TABLET BY MOUTH EVERY DAY   apixaban 5 MG Tabs tablet Commonly known as:  ELIQUIS Take 1 tablet (5 mg total) by mouth 2 (two) times daily. Restart on 11/11/2017 What changed:  additional instructions   chlorthalidone 25 MG tablet Commonly known as:  HYGROTON Take 25 mg by mouth daily.   cholecalciferol 1000 units tablet Commonly known as:  VITAMIN D Take 1,000 Units by mouth daily.   diltiazem 120 MG 24 hr capsule Commonly known as:  CARDIZEM CD Take 1 capsule (120 mg total) by mouth daily.   furosemide 20 MG tablet Commonly known as:  LASIX Take 20 mg by mouth 2 (two) times daily.   losartan 50 MG tablet Commonly known as:  COZAAR Take 1 tablet (50 mg total) by mouth daily.   pantoprazole 40 MG tablet Commonly known as:  PROTONIX Take 1 tablet (40 mg total) by mouth 2 (two) times daily before a meal. What changed:  when to take this   potassium chloride 10 MEQ tablet Commonly known as:   K-DUR,KLOR-CON Take 20 mEq by mouth daily.   PROAIR HFA 108 (90 Base) MCG/ACT inhaler Generic drug:  albuterol Inhale 2 puffs into the lungs every 6 (six) hours as needed for wheezing or shortness of breath.   sertraline 25 MG tablet Commonly known as:  ZOLOFT Take 25 mg by mouth daily.   vitamin B-12 1000 MCG tablet Commonly known as:  CYANOCOBALAMIN Take 1,000 mcg by mouth daily.      Allergies  Allergen Reactions  . Codeine Nausea And Vomiting    Made "very sick"   Follow-up Information    [name], [name], MD. Schedule an appointment as soon as possible for a visit in 1 week(s).   Specialty:  Internal Medicine Why:  Hospital follow up Contact information: [address] [place] [address] [phone]        Sanda Klein, MD .   Specialty:  Cardiology Contact information: [address] [place] [address] [phone]        [name], MD. Schedule an appointment as soon as possible for a visit in 3 week(s).   Specialty:  Gastroenterology Why:  Hospital follow up Contact information: [address] [place] [address] [phone]            The results of significant diagnostics from this hospitalization (including imaging,  microbiology, ancillary and laboratory) are listed below for reference.    Significant Diagnostic Studies: Dg Abd 2 Views  Result Date: 11/05/2017 CLINICAL DATA:  72 year old female with nausea vomiting. EXAM: ABDOMEN - 2 VIEW COMPARISON:  CT of the abdomen pelvis dated 03/22/2017 FINDINGS: There is moderate stool throughout the colon. No bowel dilatation or evidence of obstruction. No free air or radiopaque calculi. Aortoiliac atherosclerotic disease. The soft tissues and osseous structures are grossly unremarkable. IMPRESSION: No evidence of bowel obstruction. Electronically Signed   By: Anner Crete M.D.   On: 11/05/2017 01:58    Microbiology: Recent Results (from the past 240  hour(s))  MRSA PCR Screening     Status: None   Collection Time: 11/05/17  2:02 AM  Result Value Ref Range Status   MRSA by PCR NEGATIVE NEGATIVE Final    Comment:        The GeneXpert MRSA Assay (FDA approved for NASAL specimens only), is one component of a comprehensive MRSA colonization surveillance program. It is not intended to diagnose MRSA infection nor to guide or monitor treatment for MRSA infections. Performed at [facility], [place] [address] [place], [place] [address]      Labs: Basic Metabolic Panel: Recent Labs  Lab 11/05/17 0335 11/06/17 0853 11/07/17 0332 11/08/17 0805 11/09/17 0312 11/09/17 1539  NA 141 143 141 141 144  --   K 3.5 2.9* 3.5 3.4* 3.3* 3.5  CL 104 103 107 108 109  --   CO2 27 27 24 23 25   --   GLUCOSE 106* 83 87 83 84  --   BUN 36* 36* 24* 13 12  --   CREATININE 1.25* 1.31* 1.17* 1.14* 1.10*  --   CALCIUM 8.4* 8.7* 8.6* 8.8* 8.5*  --   MG  --  1.6* 1.8  --  1.4* 2.7*  PHOS  --  3.6 3.3  --   --   --    Liver Function Tests: Recent Labs  Lab 11/04/17 2240 11/05/17 0335 11/06/17 0853 11/07/17 0332  AST 16 15 14* 22  ALT 14 13* 13* 17  ALKPHOS 94 84 77 74  BILITOT 0.5 0.7 0.8 0.9  PROT 6.8 6.0* 5.9* 5.5*  ALBUMIN 3.4* 3.1* 3.0* 2.9*   No results for input(s): LIPASE, AMYLASE in the last 168 hours. No results for input(s): AMMONIA in the last 168 hours. CBC: Recent Labs  Lab 11/05/17 1156 11/05/17 1803 11/06/17 0853 11/06/17 1440  11/07/17 0332  11/08/17 0201 11/08/17 0805 11/08/17 1612 11/08/17 2247 11/09/17 0716  WBC 6.2 6.1 5.6 7.5  --  4.7  --   --   --   --   --   --   NEUTROABS  --   --  4.1 6.0  --  3.5  --   --   --   --   --   --   HGB 9.4* 9.7* 8.7* 9.5*   < > 6.4*   < > 11.5* 12.0 12.8 11.7* 11.4*  HCT 29.3* 30.6* 27.3* 30.2*   < > 20.6*   < > 34.9* 36.0 38.2 35.4* 34.6*  MCV 89.6 89.5 89.8 90.4  --  88.0  --   --   --   --   --   --   PLT 159 172 155 186  --  119*  --   --   --   --   --    --    < > = values  in this interval not displayed.   Cardiac Enzymes: No results for input(s): CKTOTAL, CKMB, CKMBINDEX, TROPONINI in the last 168 hours. BNP: BNP (last 3 results) No results for input(s): BNP in the last 8760 hours.  ProBNP (last 3 results) No results for input(s): PROBNP in the last 8760 hours.  CBG: No results for input(s): GLUCAP in the last 168 hours.     Signed:  Cristal Ford  [facility] 11/09/2017, 5:41 PM

## 2017-11-09 NOTE — Progress Notes (Signed)
[facility] Progress Note    Since last GI note: NO overt rebleeding. She is ambulating in halls, feels well.  Objective: Vital signs in last 24 hours: Temp:  [97.7 F (36.5 C)-98.4 F (36.9 C)] 97.7 F (36.5 C) (05/16 0329) Pulse Rate:  [52-71] 58 (05/16 0600) Resp:  [14-35] 19 (05/16 0600) BP: (140-188)/(45-91) 170/81 (05/16 0600) SpO2:  [91 %-98 %] 91 % (05/16 0600) Last BM Date: 11/06/17 General: alert and oriented times 3 Heart: regular rate and rythm Abdomen: soft, non-tender, non-distended, normal bowel sounds   Lab Results: Recent Labs    11/06/17 0853 11/06/17 1440  11/07/17 0332  11/08/17 1612 11/08/17 2247 11/09/17 0716  WBC 5.6 7.5  --  4.7  --   --   --   --   HGB 8.7* 9.5*   < > 6.4*   < > 12.8 11.7* 11.4*  PLT 155 186  --  119*  --   --   --   --   MCV 89.8 90.4  --  88.0  --   --   --   --    < > = values in this interval not displayed.   Recent Labs    11/07/17 0332 11/08/17 0805 11/09/17 0312  NA 141 141 144  K 3.5 3.4* 3.3*  CL 107 108 109  CO2 24 23 25   GLUCOSE 87 83 84  BUN 24* 13 12  CREATININE 1.17* 1.14* 1.10*  CALCIUM 8.6* 8.8* 8.5*   Recent Labs    11/06/17 0853 11/07/17 0332  PROT 5.9* 5.5*  ALBUMIN 3.0* 2.9*  AST 14* 22  ALT 13* 17  ALKPHOS 77 74  BILITOT 0.8 0.9    Medications: Scheduled Meds: . allopurinol  300 mg Oral Daily  . ALPRAZolam  0.25 mg Oral QHS  . amiodarone  200 mg Oral Daily  . cholecalciferol  1,000 Units Oral Daily  . diltiazem  120 mg Oral Daily  . losartan  50 mg Oral Daily  . [START ON 11/10/2017] pantoprazole  40 mg Intravenous Q12H  . potassium chloride  40 mEq Oral Daily  . sertraline  25 mg Oral Daily  . vitamin B-12  1,000 mcg Oral Daily   Continuous Infusions: . sodium chloride 75 mL/hr at 11/08/17 1400  . pantoprozole (PROTONIX) infusion 8 mg/hr (11/09/17 0327)   PRN Meds:.acetaminophen, acetaminophen, hydrALAZINE    Assessment/Plan: 72 y.o. female with post  polypectomy bleeding (residual duodenal polyp removed at [facility] 11/03/2017)  OK to d/c IV PPI infusion, start PPI twice daily. Will advance diet to heart healthy.  OK from GI perspective to go home today.  She should resume her eliquis on Saturday.  She knows to follow up with [facility].  Please call, page with any further questions or concerns.    [name], MD  11/09/2017, 8:16 AM [facility] Pager [phone]

## 2017-11-10 ENCOUNTER — Telehealth: Payer: Self-pay | Admitting: Gastroenterology

## 2017-11-10 NOTE — Telephone Encounter (Signed)
Patient daughter states pt was seen in hosp this week and had a procedure with Dr.Jaocbs on 5.14.19. Pt daughter states pt was suppose to follow up in 1-2 weeks and wants to know what to do since Dr.Jacobs doesn't have an opening until July.

## 2017-11-10 NOTE — Telephone Encounter (Signed)
OK to d/c IV PPI infusion, start PPI twice daily. Will advance diet to heart healthy.  OK from GI perspective to go home today.  She should resume her eliquis on Saturday.  She knows to follow up with [facility] GI.  Please call, page with any further questions or concerns.

## 2017-11-10 NOTE — Telephone Encounter (Signed)
The daughter was advised to have the pt f/u with [facility].  She states she will do that and call back with any further complaints

## 2017-11-14 ENCOUNTER — Encounter: Payer: Self-pay | Admitting: Cardiovascular Disease

## 2017-11-14 ENCOUNTER — Ambulatory Visit (INDEPENDENT_AMBULATORY_CARE_PROVIDER_SITE_OTHER): Payer: Medicare Other | Admitting: Cardiovascular Disease

## 2017-11-14 VITALS — BP 186/89 | HR 61 | Ht 61.0 in | Wt 251.2 lb

## 2017-11-14 DIAGNOSIS — I5032 Chronic diastolic (congestive) heart failure: Secondary | ICD-10-CM

## 2017-11-14 DIAGNOSIS — Z5181 Encounter for therapeutic drug level monitoring: Secondary | ICD-10-CM | POA: Diagnosis not present

## 2017-11-14 DIAGNOSIS — Z79899 Other long term (current) drug therapy: Secondary | ICD-10-CM | POA: Diagnosis not present

## 2017-11-14 DIAGNOSIS — I10 Essential (primary) hypertension: Secondary | ICD-10-CM

## 2017-11-14 DIAGNOSIS — I483 Typical atrial flutter: Secondary | ICD-10-CM | POA: Diagnosis not present

## 2017-11-14 DIAGNOSIS — Z7901 Long term (current) use of anticoagulants: Secondary | ICD-10-CM

## 2017-11-14 DIAGNOSIS — Z86718 Personal history of other venous thrombosis and embolism: Secondary | ICD-10-CM | POA: Diagnosis not present

## 2017-11-14 MED ORDER — CHLORTHALIDONE 25 MG PO TABS: 25.0000 mg | ORAL_TABLET | Freq: Every day | ORAL | 3 refills | Status: DC

## 2017-11-14 NOTE — Patient Instructions (Signed)
Dr Sallyanne Kuster has recommended making the following medication changes: 1. STOP Hydrochlorothiazide 2. START Chlorthalidone 25 mg - take 1 tablet daily  Your physician has requested that you regularly monitor your blood pressure at home. Please use the same machine to check your blood pressure daily. Keep a record of your blood pressures using the log sheet provided. In 2 weeks, please report your readings back to Dr C. You may use our online patient portal 'MyChart' or you can call the office to speak with a nurse.  A referral has been placed for one of our electrophysiologists to discuss atrial flutter ablation.  Dr Sallyanne Kuster recommends that you schedule a follow-up appointment in 6 months. You will receive a reminder letter in the mail two months in advance. If you don't receive a letter, please call our office to schedule the follow-up appointment.  If you need a refill on your cardiac medications before your next appointment, please call your pharmacy.

## 2017-11-14 NOTE — Progress Notes (Signed)
Cardiology Office Note:    Date:  11/15/2017   ID:  ARIES KASA, DOB 11-Apr-1946, MRN [id number]  PCP:  Thurman Coyer, MD  Cardiologist:  Sanda Klein, MD   Referring MD: Thurman Coyer, MD   Chief Complaint  Patient presents with  . Atrial Flutter    Follow-up    History of Present Illness:    Peggy Armstrong is a 72 y.o. female with a hx of atrial flutter (typical counterclockwise right atrial flutter recorded September 28 and March 26, 2017 + possible transient atrial fibrillation March 25, 2017), chronic diastolic heart failure, remote history of DVT right leg in February 2017, morbid obesity, returning for follow-up.  Atrial arrhythmia occurred in the setting of ascending cholangitis in September 2018 and was refractory to rate control.  Required amiodarone for conversion.  ECG from September 30 is convincingly consistent with atrial flutter.  There is an ECG from September 29 that is concerning for possible atrial fibrillation rapid ventricular response.  She has been on amiodarone and Eliquis since then.  She has had problems with GI bleeding.  Last November at [facility] she underwent EGD and had endoscopic removal of a "polyp".  This was diagnosed as being a malignant, but she was told that the procedure "got it all".  A follow-up EGD in February 4431 was uncomplicated and showed normal findings.  Another endoscopy performed for follow-up on Nov 03, 5398 was complicated by bleeding after removal of a small polyp.  Even though her Eliquis was stopped for the procedure and had not been restarted, few days later she developed hematemesis and required transfusions.  On May 14 her hemoglobin was as low as 6.4.  I believe she received 4 units of PRBCs.  Bleeding stopped and she was restarted on Eliquis on Nov 12, 2017.  Her most recent hemoglobin was 11.4 on May 16.  Notes from Dr. Alvy Bimler in the oncology clinic report that the diagnosis was cancer of the ampulla  of Vater, without evidence of recurrence on repeat endoscopies.  Additional important comorbidities include rheumatoid arthritis, asthma and systemic hypertension.  At some point during the recent events with GI bleeding her chlorthalidone was discontinued.  She was recently restarted on a medicine that starts with the letter H, presumably hydrochlorothiazide, but this has not been as good at maintaining normal blood pressure.  Notes mention problems with hypokalemia, but her potassium was 3.5 on May 16.  Her renal function is normal.  She has mild leg edema.  She is very sedentary and is hard to say if she is short of breath, but she does not have orthopnea or PND.  She is on losartan and diltiazem as well.  Her heart rate is 60 bpm.  Past Medical History:  Diagnosis Date  . DVT (deep venous thrombosis) (HCC)    right DVT  . GERD (gastroesophageal reflux disease)   . Gout   . Gout   . Hypertension   . RA (rheumatoid arthritis) ([place])     Past Surgical History:  Procedure Laterality Date  . ABDOMINAL HYSTERECTOMY    . CHOLECYSTECTOMY    . ENDOSCOPIC RETROGRADE CHOLANGIOPANCREATOGRAPHY (ERCP) WITH PROPOFOL N/A 06/15/2017   Procedure: ENDOSCOPIC RETROGRADE CHOLANGIOPANCREATOGRAPHY (ERCP) WITH PROPOFOL;  Surgeon: [name], MD;  Location: [facility];  Service: Endoscopy;  Laterality: N/A;  . ESOPHAGOGASTRODUODENOSCOPY (EGD) WITH PROPOFOL N/A 03/24/2017   Procedure: ESOPHAGOGASTRODUODENOSCOPY (EGD) WITH PROPOFOL;  Surgeon: Doran Stabler, MD;  Location: [facility];  Service: Gastroenterology;  Laterality: N/A;  . ESOPHAGOGASTRODUODENOSCOPY (EGD) WITH PROPOFOL N/A 11/07/2017   Procedure: ESOPHAGOGASTRODUODENOSCOPY (EGD) WITH PROPOFOL;  Surgeon: [name], MD;  Location: [facility];  Service: Endoscopy;  Laterality: N/A;  . EUS N/A 04/06/2017   Procedure: UPPER ENDOSCOPIC ULTRASOUND (EUS) LINEAR;  Surgeon: [name], MD;  Location: [facility];  Service:  Endoscopy;  Laterality: N/A;  to evaluate duodenal mass  . HERNIA REPAIR    . IR BILIARY DRAIN PLACEMENT WITH CHOLANGIOGRAM  03/25/2017  . IR CONVERT BILIARY DRAIN TO INT EXT BILIARY DRAIN  04/03/2017    Current Medications: Current Meds  Medication Sig  . allopurinol (ZYLOPRIM) 300 MG tablet Take 300 mg by mouth daily.   Marland Kitchen ALPRAZolam (XANAX) 0.25 MG tablet Take 0.25 mg by mouth at bedtime.   Marland Kitchen amiodarone (PACERONE) 200 MG tablet TAKE 1 TABLET BY MOUTH EVERY DAY  . apixaban (ELIQUIS) 5 MG TABS tablet Take 1 tablet (5 mg total) by mouth 2 (two) times daily. Restart on 11/11/2017  . cholecalciferol (VITAMIN D) 1000 units tablet Take 1,000 Units by mouth daily.  Marland Kitchen diltiazem (CARDIZEM CD) 120 MG 24 hr capsule Take 1 capsule (120 mg total) by mouth daily.  Marland Kitchen losartan (COZAAR) 50 MG tablet Take 1 tablet (50 mg total) by mouth daily.  . pantoprazole (PROTONIX) 40 MG tablet Take 1 tablet (40 mg total) by mouth 2 (two) times daily before a meal.  . potassium chloride (K-DUR,KLOR-CON) 10 MEQ tablet Take 20 mEq by mouth daily.   Marland Kitchen PROAIR HFA 108 (90 Base) MCG/ACT inhaler Inhale 2 puffs into the lungs every 6 (six) hours as needed for wheezing or shortness of breath.   . sertraline (ZOLOFT) 25 MG tablet Take 25 mg by mouth daily.   . vitamin B-12 (CYANOCOBALAMIN) 1000 MCG tablet Take 1,000 mcg by mouth daily.  . [DISCONTINUED] chlorthalidone (HYGROTON) 25 MG tablet Take 25 mg by mouth daily.  . [DISCONTINUED] furosemide (LASIX) 20 MG tablet Take 20 mg by mouth 2 (two) times daily.     Allergies:   Codeine   Social History   Socioeconomic History  . Marital status: Divorced    Spouse name: Not on file  . Number of children: 2  . Years of education: Not on file  . Highest education level: Not on file  Occupational History  . Not on file  Social Needs  . Financial resource strain: Not on file  . Food insecurity:    Worry: Not on file    Inability: Not on file  . Transportation needs:     Medical: Not on file    Non-medical: Not on file  Tobacco Use  . Smoking status: Never Smoker  . Smokeless tobacco: Never Used  Substance and Sexual Activity  . Alcohol use: No  . Drug use: No  . 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  Lifestyle  . Physical activity:    Days per week: Not on file    Minutes per session: Not on file  . Stress: Not on file  Relationships  . Social connections:    Talks on phone: Not on file    Gets together: Not on file    Attends religious service: Not on file    Active member of club or organization: Not on file    Attends meetings of clubs or organizations: Not on file    Relationship status: Not on file  Other Topics Concern  .  Not on file  Social History Narrative  . Not on file     Family History: The patient's family history includes Cancer in her father.  ROS:   Please see the history of present illness.     All other systems reviewed and are negative.  EKGs/Labs/Other Studies Reviewed:    The following studies were reviewed today: Extensive notes from [facility], multiple endoscopy procedures and hospitalization notes, notes from the: Oncology clinic from Dr. Alvy Bimler  EKG:  EKG is not ordered today.  The ekg ordered Nov 06, 2017 demonstrates sinus rhythm nonspecific ST-T wave changes, prolonged QT consistent with amiodarone effect  Recent Labs: 03/26/2017: TSH 4.457 11/07/2017: ALT 17; Platelets 119 11/09/2017: BUN 12; Creatinine, Ser 1.10; Hemoglobin 11.4; Magnesium 2.7; Potassium 3.5; Sodium 144  Recent Lipid Panel No results found for: CHOL, TRIG, HDL, CHOLHDL, VLDL, LDLCALC, LDLDIRECT  Physical Exam:    VS:  BP (!) 186/89   Pulse 61   Ht 5\' 1"  (1.549 m)   Wt 251 lb 3.2 oz (113.9 kg)   BMI 47.46 kg/m     Wt Readings from Last 3 Encounters:  11/14/17 251 lb 3.2 oz (113.9 kg)  11/07/17 264 lb 8.8 oz (120 kg)  10/02/17 253 lb 1.6 oz (114.8 kg)     GEN: Morbidly obese, well  nourished, well developed in no acute distress HEENT: Normal NECK: No JVD; No carotid bruits LYMPHATICS: No lymphadenopathy CARDIAC: RRR, no murmurs, rubs, gallops RESPIRATORY:  Clear to auscultation without rales, wheezing or rhonchi  ABDOMEN: Soft, non-tender, non-distended MUSCULOSKELETAL:  No edema; No deformity  SKIN: Warm and dry NEUROLOGIC:  Alert and oriented x 3 PSYCHIATRIC:  Normal affect   ASSESSMENT:    1. Atypical atrial flutter (HCC)    PLAN:    In order of problems listed above:  1. Atrial flutter: She has not had clinical recurrence of the arrhythmia while on amiodarone, but I am concerned about her future risk for GI bleeding.  Her tracings mostly showed typical counterclockwise right atrial flutter and would make her an excellent candidate for ablation.  She does have one electrocardiogram from March 25, 2017 that may represent atrial fibrillation with rapid ventricular response.  If she could have curative ablation of the atrial flutter, this would allow [place] to stop to potentially dangerous medications: Amiodarone and Eliquis.  I am particularly concerned that she is at risk for future GI bleeding.  Another option is to simply discontinue the amiodarone and the anticoagulation and monitor for recurrence of the arrhythmia, occurred during the setting of acute illness (sepsis, ascending cholangitis).  For the time being she will remain on both amiodarone and Eliquis and I will arrange evaluation in the EP clinic. 2. Amiodarone: Normal TSH in September 2018, has not been checked since.  Multiple numerous assays with normal transaminases in the recent weeks. 3. Eliquis: While it is comforting that she has no evidence of GI malignancy at this time, I wonder about the potential for recurrence of carcinoma of the ampulla of Vater. She will require future GI endoscopy to assess for this disorder. This would again put her at risk for GI bleeding.  It would be great if he could find  a long-term solution that would allow [place] to discontinue anticoagulation. 4. CHF: Today she appears to be mildly hypervolemic.  She weighs roughly 10 pounds more than her baseline 241 pounds last fall.  Continue the diuretic even a week thiazide diuretic, would be beneficial. 5. DVT  R femoral-popliteal leg: She has already been on anticoagulation for 2 years since this diagnosis and in and of itself would not be a reason to continue Eliquis.  The DVT was 6. HTN: Blood pressure is consistently elevated.  Currently on diltiazem (dose cannot be increased due to bradycardia) and losartan. I am confused about her medications.  Her family members state that she has been started on a fluid pill that begins with H, presumably hydrochlorothiazide.  This does not seem to be working.  She did very well from a blood pressure point of view on chlorthalidone and will restart this.  If she develops hypokalemia again, I would add spironolactone.  Plan to check her potassium level in about 2 weeks when we will also check her TSH. 7. Morbid obesity: Increases her risk of recurrent atrial arrhythmia and makes the option for electrophysiology study/radiofrequency ablation a little less appealing.   Medication Adjustments/Labs and Tests Ordered: Current medicines are reviewed at length with the patient today.  Concerns regarding medicines are outlined above.  Orders Placed This Encounter  Procedures  . Ambulatory referral to Cardiac Electrophysiology   Meds ordered this encounter  Medications  . chlorthalidone (HYGROTON) 25 MG tablet    Sig: Take 1 tablet (25 mg total) by mouth daily.    Dispense:  90 tablet    Refill:  3    Patient Instructions  Dr Sallyanne Kuster has recommended making the following medication changes: 1. STOP Hydrochlorothiazide 2. START Chlorthalidone 25 mg - take 1 tablet daily  Your physician has requested that you regularly monitor your blood pressure at home. Please use the same machine to  check your blood pressure daily. Keep a record of your blood pressures using the log sheet provided. In 2 weeks, please report your readings back to Dr C. You may use our online patient portal 'MyChart' or you can call the office to speak with a nurse.  A referral has been placed for one of our electrophysiologists to discuss atrial flutter ablation.  Dr Sallyanne Kuster recommends that you schedule a follow-up appointment in 6 months. You will receive a reminder letter in the mail two months in advance. If you don't receive a letter, please call our office to schedule the follow-up appointment.  If you need a refill on your cardiac medications before your next appointment, please call your pharmacy.    Signed, Sanda Klein, MD  11/15/2017 6:02 PM    [facility]

## 2017-11-15 ENCOUNTER — Encounter: Payer: Self-pay | Admitting: Cardiovascular Disease

## 2017-12-01 ENCOUNTER — Institutional Professional Consult (permissible substitution): Payer: Medicare Other | Admitting: Internal Medicine

## 2017-12-06 ENCOUNTER — Other Ambulatory Visit: Payer: Self-pay | Admitting: Cardiovascular Disease

## 2017-12-07 ENCOUNTER — Institutional Professional Consult (permissible substitution): Payer: Medicare Other | Admitting: Internal Medicine

## 2017-12-20 ENCOUNTER — Institutional Professional Consult (permissible substitution): Payer: Medicare Other | Admitting: Internal Medicine

## 2017-12-27 ENCOUNTER — Institutional Professional Consult (permissible substitution): Payer: Medicare Other | Admitting: Internal Medicine

## 2018-01-01 ENCOUNTER — Inpatient Hospital Stay: Payer: Medicare Other

## 2018-01-01 ENCOUNTER — Encounter: Payer: Self-pay | Admitting: Hematology and Oncology

## 2018-01-01 ENCOUNTER — Inpatient Hospital Stay: Payer: Medicare Other | Attending: Hematology and Oncology | Admitting: Hematology and Oncology

## 2018-01-01 NOTE — Progress Notes (Signed)
n

## 2018-01-08 ENCOUNTER — Telehealth: Payer: Self-pay | Admitting: Hematology and Oncology

## 2018-01-08 NOTE — Telephone Encounter (Signed)
Patient called.  She missed her appt on 7/8.  Had previously been in the hospital.  Wanted to reschedule in Sept. Mailed calendar.

## 2018-03-01 ENCOUNTER — Encounter: Payer: Self-pay | Admitting: Hematology and Oncology

## 2018-03-01 ENCOUNTER — Telehealth: Payer: Self-pay | Admitting: Hematology and Oncology

## 2018-03-01 ENCOUNTER — Encounter (INDEPENDENT_AMBULATORY_CARE_PROVIDER_SITE_OTHER): Payer: Self-pay

## 2018-03-01 ENCOUNTER — Inpatient Hospital Stay: Payer: Medicare Other | Attending: Hematology and Oncology

## 2018-03-01 ENCOUNTER — Inpatient Hospital Stay (HOSPITAL_BASED_OUTPATIENT_CLINIC_OR_DEPARTMENT_OTHER): Payer: Medicare Other | Admitting: Hematology and Oncology

## 2018-03-01 DIAGNOSIS — C241 Malignant neoplasm of ampulla of Vater: Secondary | ICD-10-CM | POA: Insufficient documentation

## 2018-03-01 DIAGNOSIS — N183 Chronic kidney disease, stage 3 unspecified: Secondary | ICD-10-CM

## 2018-03-01 DIAGNOSIS — I82501 Chronic embolism and thrombosis of unspecified deep veins of right lower extremity: Secondary | ICD-10-CM

## 2018-03-01 DIAGNOSIS — Z862 Personal history of diseases of the blood and blood-forming organs and certain disorders involving the immune mechanism: Secondary | ICD-10-CM | POA: Insufficient documentation

## 2018-03-01 DIAGNOSIS — M0579 Rheumatoid arthritis with rheumatoid factor of multiple sites without organ or systems involvement: Secondary | ICD-10-CM

## 2018-03-01 DIAGNOSIS — I484 Atypical atrial flutter: Secondary | ICD-10-CM | POA: Diagnosis not present

## 2018-03-01 DIAGNOSIS — D500 Iron deficiency anemia secondary to blood loss (chronic): Secondary | ICD-10-CM

## 2018-03-01 DIAGNOSIS — I480 Paroxysmal atrial fibrillation: Secondary | ICD-10-CM

## 2018-03-01 DIAGNOSIS — Z7901 Long term (current) use of anticoagulants: Secondary | ICD-10-CM | POA: Insufficient documentation

## 2018-03-01 LAB — BASIC METABOLIC PANEL
ANION GAP: 10 (ref 5–15)
BUN: 19 mg/dL (ref 8–23)
CHLORIDE: 104 mmol/L (ref 98–111)
CO2: 30 mmol/L (ref 22–32)
Calcium: 9.3 mg/dL (ref 8.9–10.3)
Creatinine, Ser: 1.36 mg/dL — ABNORMAL HIGH (ref 0.44–1.00)
GFR calc Af Amer: 44 mL/min — ABNORMAL LOW (ref >60)
GFR, EST NON AFRICAN AMERICAN: 38 mL/min — AB (ref >60)
GLUCOSE: 85 mg/dL (ref 70–99)
POTASSIUM: 3.3 mmol/L — AB (ref 3.5–5.1)
SODIUM: 144 mmol/L (ref 135–145)

## 2018-03-01 LAB — FERRITIN: FERRITIN: 70 ng/mL (ref 11–307)

## 2018-03-01 LAB — CBC WITH DIFFERENTIAL/PLATELET
BASOS ABS: 0 10*3/uL (ref 0.0–0.1)
Basophils Relative: 0 %
EOS ABS: 0.2 10*3/uL (ref 0.0–0.5)
Eosinophils Relative: 4 %
HCT: 37.4 % (ref 34.8–46.6)
HEMOGLOBIN: 12 g/dL (ref 11.6–15.9)
LYMPHS PCT: 16 %
Lymphs Abs: 0.8 10*3/uL — ABNORMAL LOW (ref 0.9–3.3)
MCH: 29.4 pg (ref 25.1–34.0)
MCHC: 32.1 g/dL (ref 31.5–36.0)
MCV: 91.7 fL (ref 79.5–101.0)
MONO ABS: 0.5 10*3/uL (ref 0.1–0.9)
Monocytes Relative: 9 %
NEUTROS ABS: 3.8 10*3/uL (ref 1.5–6.5)
NEUTROS PCT: 71 %
PLATELETS: 183 10*3/uL (ref 145–400)
RBC: 4.08 MIL/uL (ref 3.70–5.45)
RDW: 15.4 % — ABNORMAL HIGH (ref 11.2–14.5)
WBC: 5.3 10*3/uL (ref 3.9–10.3)

## 2018-03-01 MED ORDER — LOSARTAN POTASSIUM 25 MG PO TABS: 25.0000 mg | ORAL_TABLET | Freq: Every day | ORAL | 0 refills | Status: DC

## 2018-03-01 NOTE — Assessment & Plan Note (Signed)
She had history of recurrent GI bleed secondary to chronic anticoagulation therapy and ulcers Her iron studies were within normal limits and she is not anemic I will continue close follow-up with history, physical examination, blood work and see her back in 3 months

## 2018-03-01 NOTE — Progress Notes (Signed)
[place] OFFICE PROGRESS NOTE  Patient Care Team: [name], [name], MD as PCP - General (Internal Medicine) Croitoru, Dani Gobble, MD as PCP - Cardiology (Cardiology) Valinda Party, MD as Consulting Physician (Rheumatology)  ASSESSMENT & PLAN:  Iron deficiency anemia She had history of recurrent GI bleed secondary to chronic anticoagulation therapy and ulcers Her iron studies were within normal limits and she is not anemic I will continue close follow-up with history, physical examination, blood work and see her back in 3 months  Cancer of ampulla of Vater ([place]) She followed closely at [facility] Recent polyp biopsy showed dysplasia but no evidence of malignancy She will continue follow-up there in 6 months, due around November of this year  CKD (chronic kidney disease), stage III ([place]) This is stable.  She will continue medical management Her calculated creatinine clearance is adequate We will monitor carefully while she is on chronic anticoagulation therapy.  Atypical atrial flutter ([place]) I have reviewed documentation by cardiologist Due to her irregular heartbeat, she is taking chronic anticoagulation therapy for stroke prevention She is wondering whether she should proceed with EP evaluation and possible ablation therapy I think it is an excellent idea to explore the possibility of being off anticoagulation in the future due to predisposition to recurrent GI bleed   No orders of the defined types were placed in this encounter.   INTERVAL HISTORY: Please see below for problem oriented charting. She returns for further follow-up She is feeling well She has gained a lot of weight Denies recent abdominal pain, nausea or changes in bowel habits She needed blood transfusion in May but since then has been doing well She is contemplating whether she should get EP evaluation to get ablation.  She was told there is a possibility she can stop anticoagulation therapy The  patient denies any recent signs or symptoms of bleeding such as spontaneous epistaxis, hematuria or hematochezia.  SUMMARY OF ONCOLOGIC HISTORY:   Cancer of ampulla of Vater ([place])   03/22/2017 Imaging    1. Mild intra hepatic and moderate extrahepatic biliary dilatation, post cholecystectomy. Recommend correlation with laboratory values, follow-up MR or ERCP as indicated 2. Sigmoid colon diverticular disease without acute inflammation.     03/22/2017 - 03/30/2017 Hospital Admission    She was admitted for management of biliary obstruction    03/23/2017 Imaging    1. Mild to moderately motion degraded exam. 2. Biliary duct dilatation after cholecystectomy. No choledocholithiasis or well-defined obstructive mass. Given the elevated bilirubin, consider further evaluation with ERCP to exclude ampullary stricture or otherwise occult ampullary lesion. 3. Hiatal hernia. 4. Iron deposition in the liver    03/24/2017 Procedure    She underwent EGD which revealed ulcerated mass in 2nd portion of duodenum    03/24/2017 Pathology Results    Duodenum, Biopsy, duodenum mass - ADENOMA. - NO HIGH GRADE DYSPLASIA OR MALIGNANCY    03/24/2017 Procedure    Status post percutaneous transhepatic cholangiogram with placement of a left approach externalized biliary drain.    03/29/2017 Imaging    CT chest 1. Small to moderate layering bilateral pleural effusions are new since 03/23/2017. Superimposed pulmonary atelectasis with no definite pneumonia. 2. Rounded but fluid density 3 cm area in the mediastinum anterior to the carina is favored to be pericardial fluid in a superior pericardial recess (normal variant). A follow-up Chest CT with IV contrast (such as in 3 months time) is recommended to document stability. 3. Partially visible upper abdomen transhepatic biliary drain  with no adverse features. 4. Cardiomegaly.    04/03/2017 Procedure    Status post routine exchange of left hepatic approach external  biliary drain for a new, modified, 10 [place] internal/ external drain.     04/06/2017 Pathology Results    Duodenum, Biopsy, peri ampullary mass - DUODENAL TUBULOVILLOUS ADENOMA. - NO HIGH GRADE DYSPLASIA OR INVASIVE CARCINOMA    04/06/2017 Procedure    EUS - 3.5cm soft, round ampullary neoplasm without evident deep invasion into the pancreas, duodenal wall. I repeated mucosal biopsies of the mass which previously showed adenoma without HGD. I also performed EUS FNA from deeper within the mass, await final cytology results. - Capped internal/external biliary drain is in good position extending into the duodenum lumen from within the mass.    04/06/2017 Pathology Results    FINE NEEDLE ASPIRATION, ENDOSCOPIC PERI AMPULLARY MASS ( SPECIMEN 1 OF 1 COLLECTED 04/06/2017) ATYPICAL GLANDULAR CELLS, SEE COMMENT. 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 The lesion was sampled with forceps extensively.  - The previously placed platic biliary stent was removed with a snare and sent for cytology review. 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 - 4cm long, 72mm covered SEMS was placed across the stricture in good position      06/16/2017 Pathology Results    Duodenum, Biopsy, ampullary - DUODENAL TUBULOVILLOUS ADENOMA. - THERE IS NO EVIDENCE OF HIGH GRADE DYSPLASIA.     REVIEW OF SYSTEMS:   Constitutional: Denies fevers, chills or abnormal weight loss Eyes: Denies blurriness of vision Ears, nose, mouth, throat, and face: Denies mucositis  or sore throat Respiratory: Denies cough, dyspnea or wheezes Cardiovascular: Denies palpitation, chest discomfort or lower extremity swelling Gastrointestinal:  Denies nausea, heartburn or change in bowel habits Skin: Denies abnormal skin rashes Lymphatics: Denies new lymphadenopathy Neurological:Denies numbness, tingling or new weaknesses Behavioral/Psych: Mood is stable, no new changes  All other systems were reviewed with the patient and are negative.  I have reviewed the past medical history, past surgical history, social history and family history with the patient and they are unchanged from previous note.  ALLERGIES:  is allergic to codeine.  MEDICATIONS:  Current Outpatient Medications  Medication Sig Dispense Refill  . allopurinol (ZYLOPRIM) 300 MG tablet Take 300 mg by mouth daily.     Marland Kitchen ALPRAZolam (XANAX) 0.25 MG tablet Take 0.25 mg by mouth at bedtime.   2  . amiodarone (PACERONE) 200 MG tablet TAKE 1 TABLET BY MOUTH EVERY DAY 90 tablet 1  . apixaban (ELIQUIS) 5 MG TABS tablet Take 1 tablet (5 mg total) by mouth 2 (two) times daily. Restart on 11/11/2017    . chlorthalidone (HYGROTON) 25 MG tablet Take 1 tablet (25 mg total) by mouth daily. 90 tablet 3  . cholecalciferol (VITAMIN D) 1000 units tablet Take 1,000 Units by mouth daily.    Marland Kitchen diltiazem (CARDIZEM CD) 120 MG 24 hr capsule Take 1 capsule (120 mg total) by mouth daily. 90 capsule 3  . losartan (COZAAR) 25 MG tablet Take 1 tablet (25 mg total) by mouth daily. 30 tablet 0  . pantoprazole (PROTONIX) 40 MG tablet TAKE  1 TABLET (40 MG TOTAL) BY MOUTH 2 (TWO) TIMES DAILY BEFORE A MEAL. 60 tablet 4  . potassium chloride (K-DUR,KLOR-CON) 10 MEQ tablet Take 20 mEq by mouth daily.     Marland Kitchen PROAIR HFA 108 (90 Base) MCG/ACT inhaler Inhale 2 puffs into the lungs every 6 (six) hours as needed for wheezing or shortness of breath.   2  . sertraline (ZOLOFT) 25 MG tablet Take 25 mg by mouth daily.     . vitamin B-12 (CYANOCOBALAMIN) 1000  MCG tablet Take 1,000 mcg by mouth daily.     No current facility-administered medications for this visit.     PHYSICAL EXAMINATION: ECOG PERFORMANCE STATUS: 1 - Symptomatic but completely ambulatory  Vitals:   03/01/18 1516  BP: (!) 178/97  Pulse: (!) 58  Resp: 18  Temp: (!) 97.5 F (36.4 C)  SpO2: 99%   Filed Weights   03/01/18 1516  Weight: 262 lb (118.8 kg)    GENERAL:alert, no distress and comfortable SKIN: skin color, texture, turgor are normal, no rashes or significant lesions. Noted significant bruising Musculoskeletal:no cyanosis of digits and no clubbing  NEURO: alert & oriented x 3 with fluent speech, no focal motor/sensory deficits  LABORATORY DATA:  I have reviewed the data as listed    Component Value Date/Time   NA 144 03/01/2018 1416   NA 144 08/14/2017 1651   NA 142 08/11/2016 0936   K 3.3 (L) 03/01/2018 1416   K 3.4 (L) 08/11/2016 0936   CL 104 03/01/2018 1416   CO2 30 03/01/2018 1416   CO2 25 08/11/2016 0936   GLUCOSE 85 03/01/2018 1416   GLUCOSE 119 08/11/2016 0936   BUN 19 03/01/2018 1416   BUN 20 08/14/2017 1651   BUN 23.3 08/11/2016 0936   CREATININE 1.36 (H) 03/01/2018 1416   CREATININE 1.3 (H) 08/11/2016 0936   CALCIUM 9.3 03/01/2018 1416   CALCIUM 9.6 08/11/2016 0936   PROT 5.5 (L) 11/07/2017 0332   PROT 6.8 08/11/2016 0936   ALBUMIN 2.9 (L) 11/07/2017 0332   ALBUMIN 3.3 (L) 08/11/2016 0936   AST 22 11/07/2017 0332   AST 16 08/11/2016 0936   ALT 17 11/07/2017 0332   ALT 18 08/11/2016 0936   ALKPHOS 74 11/07/2017 0332   ALKPHOS 121 08/11/2016 0936   BILITOT 0.9 11/07/2017 0332   BILITOT 0.36 08/11/2016 0936   GFRNONAA 38 (L) 03/01/2018 1416   GFRAA 44 (L) 03/01/2018 1416    No results found for: SPEP, UPEP  Lab Results  Component Value Date   WBC 5.3 03/01/2018   NEUTROABS 3.8 03/01/2018   HGB 12.0 03/01/2018   HCT 37.4 03/01/2018   MCV 91.7 03/01/2018   PLT 183 03/01/2018      Chemistry      Component Value  Date/Time   NA 144 03/01/2018 1416   NA 144 08/14/2017 1651   NA 142 08/11/2016 0936   K 3.3 (L) 03/01/2018 1416   K 3.4 (L) 08/11/2016 0936   CL 104 03/01/2018 1416   CO2 30 03/01/2018 1416   CO2 25 08/11/2016 0936   BUN 19 03/01/2018 1416   BUN 20 08/14/2017 1651   BUN 23.3 08/11/2016 0936   CREATININE 1.36 (H) 03/01/2018 1416   CREATININE 1.3 (H) 08/11/2016 0936      Component Value Date/Time   CALCIUM 9.3 03/01/2018 1416   CALCIUM 9.6 08/11/2016 0936   ALKPHOS 74 11/07/2017 0332   ALKPHOS 121 08/11/2016 0936   AST 22 11/07/2017 0332  AST 16 08/11/2016 0936   ALT 17 11/07/2017 0332   ALT 18 08/11/2016 0936   BILITOT 0.9 11/07/2017 0332   BILITOT 0.36 08/11/2016 0936      All questions were answered. The patient knows to call the clinic with any problems, questions or concerns. No barriers to learning was detected.  I spent 15 minutes counseling the patient face to face. The total time spent in the appointment was 20 minutes and more than 50% was on counseling and review of test results  Heath Lark, MD 03/01/2018 4:01 PM

## 2018-03-01 NOTE — Assessment & Plan Note (Signed)
She followed closely at [facility] Recent polyp biopsy showed dysplasia but no evidence of malignancy She will continue follow-up there in 6 months, due around November of this year

## 2018-03-01 NOTE — Assessment & Plan Note (Addendum)
This is stable.  She will continue medical management Her calculated creatinine clearance is adequate We will monitor carefully while she is on chronic anticoagulation therapy.

## 2018-03-01 NOTE — Telephone Encounter (Signed)
Gave pt avs and calendar  °

## 2018-03-01 NOTE — Assessment & Plan Note (Signed)
I have reviewed documentation by cardiologist Due to her irregular heartbeat, she is taking chronic anticoagulation therapy for stroke prevention She is wondering whether she should proceed with EP evaluation and possible ablation therapy I think it is an excellent idea to explore the possibility of being off anticoagulation in the future due to predisposition to recurrent GI bleed

## 2018-04-03 ENCOUNTER — Other Ambulatory Visit: Payer: Self-pay | Admitting: Cardiovascular Disease

## 2018-04-23 DIAGNOSIS — Z23 Encounter for immunization: Secondary | ICD-10-CM | POA: Insufficient documentation

## 2018-04-23 HISTORY — DX: Encounter for immunization: Z23

## 2018-05-01 ENCOUNTER — Other Ambulatory Visit: Payer: Self-pay | Admitting: Cardiovascular Disease

## 2018-05-01 DIAGNOSIS — I480 Paroxysmal atrial fibrillation: Secondary | ICD-10-CM

## 2018-05-01 MED ORDER — DILTIAZEM HCL ER COATED BEADS 120 MG PO CP24: 120.0000 mg | ORAL_CAPSULE | Freq: Every day | ORAL | 0 refills | Status: DC

## 2018-05-01 NOTE — Telephone Encounter (Signed)
° ° ° °*  STAT* If patient is at the pharmacy, call can be transferred to refill team.   1. Which medications need to be refilled? (please list name of each medication and dose if known) diltiazem (CARDIZEM CD) 120 MG 24 hr capsule  2. Which pharmacy/location (including street and city if local pharmacy) is medication to be sent to? [id number] - [place], [place] - [address]. AT [address]  3. Do they need a 30 day or 90 day supply? [place]

## 2018-05-10 ENCOUNTER — Encounter: Payer: Self-pay | Admitting: *Deleted

## 2018-05-11 ENCOUNTER — Ambulatory Visit: Payer: Medicare Other | Admitting: Cardiovascular Disease

## 2018-05-22 ENCOUNTER — Ambulatory Visit: Payer: Medicare Other | Admitting: Cardiovascular Disease

## 2018-05-31 ENCOUNTER — Other Ambulatory Visit: Payer: Self-pay | Admitting: Hematology and Oncology

## 2018-05-31 ENCOUNTER — Inpatient Hospital Stay: Payer: Medicare Other | Attending: Hematology and Oncology

## 2018-05-31 ENCOUNTER — Encounter: Payer: Self-pay | Admitting: Hematology and Oncology

## 2018-05-31 ENCOUNTER — Inpatient Hospital Stay: Payer: Medicare Other | Admitting: Hematology and Oncology

## 2018-05-31 DIAGNOSIS — D500 Iron deficiency anemia secondary to blood loss (chronic): Secondary | ICD-10-CM

## 2018-06-07 ENCOUNTER — Ambulatory Visit: Payer: Medicare Other | Admitting: Physician Assistant

## 2018-07-01 IMAGING — CR DG ABDOMEN 2V
3 series · 3 of 3 positions shown · non-contrast
Comparison: CT of the abdomen pelvis dated 03/22/2017

CLINICAL DATA: 72-year-old female with nausea vomiting.

EXAM:
ABDOMEN - 2 VIEW

[w abdomen upright]
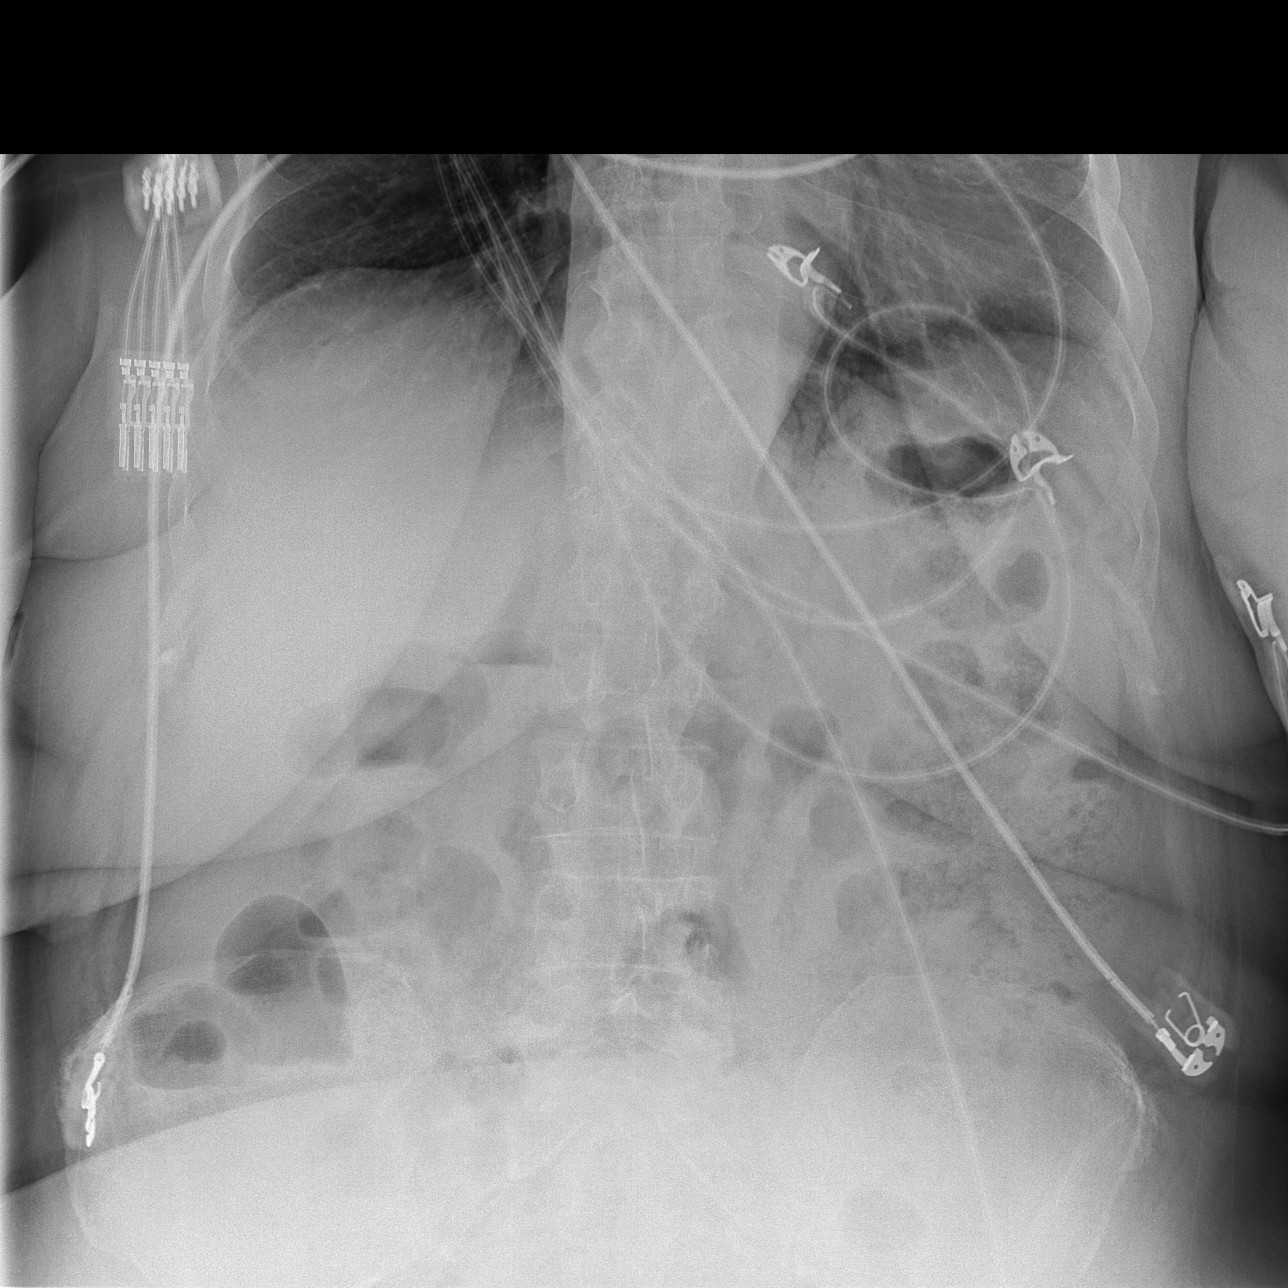

[t abdomen supine (1 of 2)]
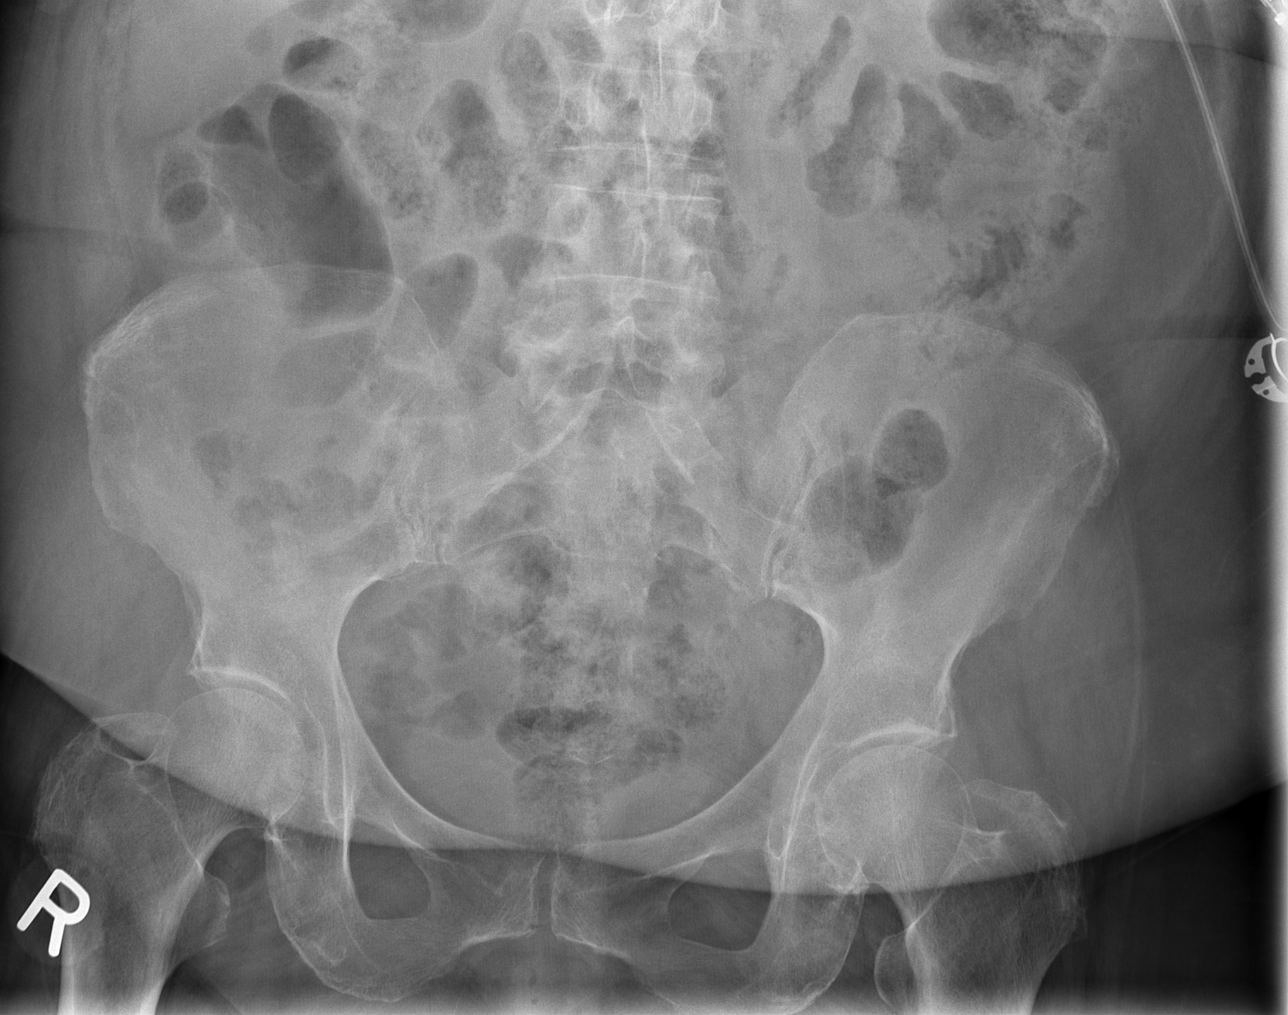

[t abdomen supine (2 of 2)]
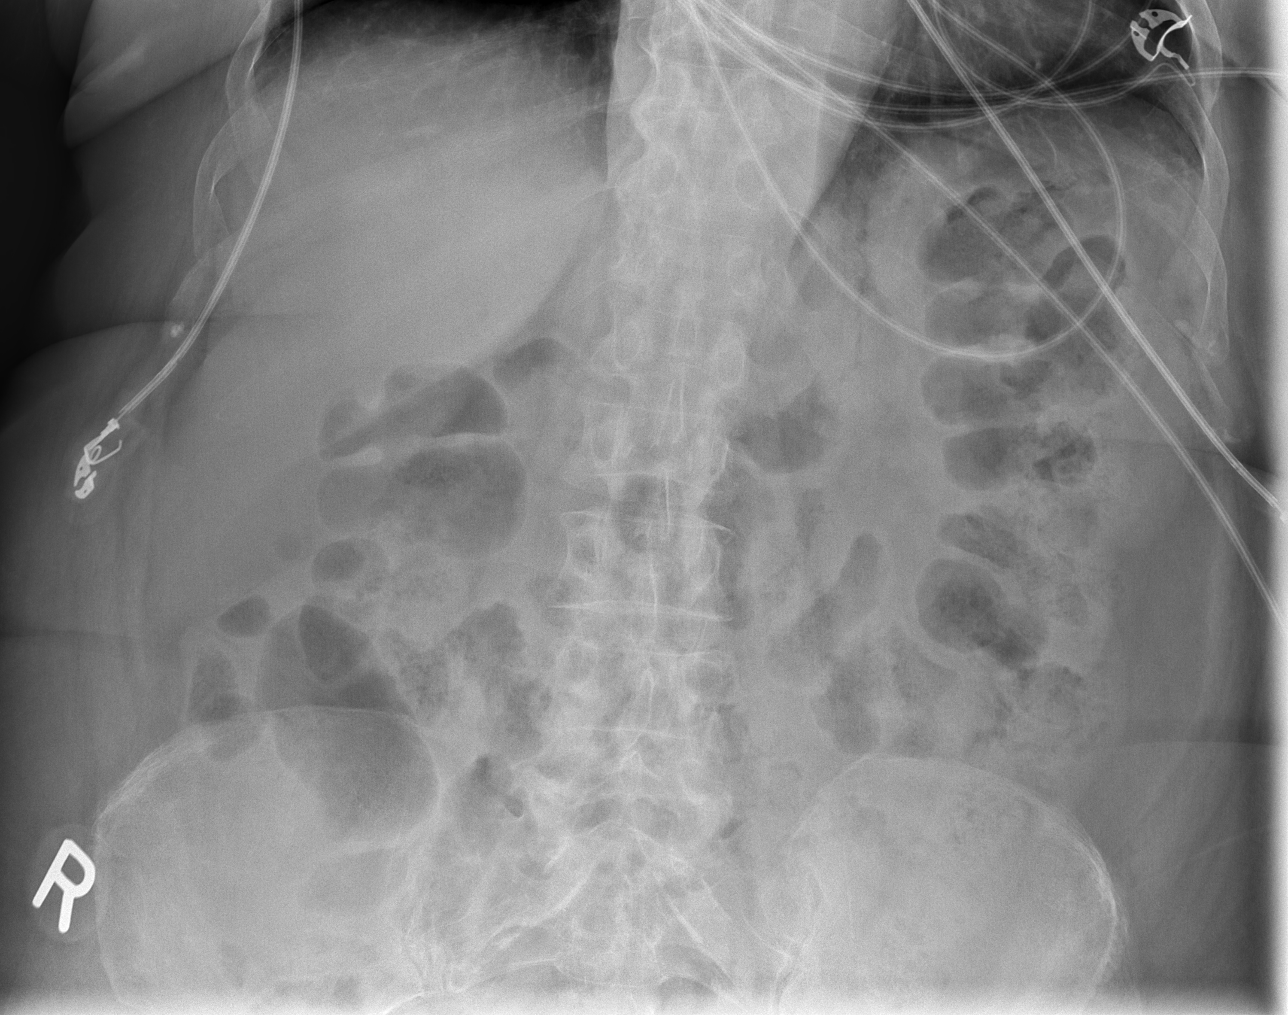

[3 of 3 positions shown; findings below may reference images not displayed]

FINDINGS: There is moderate stool throughout the colon. No bowel dilatation or
evidence of obstruction. No free air or radiopaque calculi.
Aortoiliac atherosclerotic disease. The soft tissues and osseous
structures are grossly unremarkable.
IMPRESSION: No evidence of bowel obstruction.

## 2018-07-09 ENCOUNTER — Telehealth: Payer: Self-pay

## 2018-07-09 ENCOUNTER — Telehealth: Payer: Self-pay | Admitting: Hematology and Oncology

## 2018-07-09 NOTE — Telephone Encounter (Signed)
She called and left a message that she needs to schedule follow up appt. Ask that the office call her daughter to schedule appt.

## 2018-07-09 NOTE — Telephone Encounter (Signed)
Called pt daughter per 1/13 sch message - pt daughter did not want appt given - will come in and set up appt .

## 2018-07-13 ENCOUNTER — Telehealth: Payer: Self-pay | Admitting: Hematology and Oncology

## 2018-07-13 NOTE — Telephone Encounter (Signed)
Returned call to patient dtr re rescheduling appointment. Spoke with dtr Jenny Reichmann re new date/time for 07/26/2018 @ 12:30 pm for lab/NG. Daughter expressed trouble getting new appointment and was given my name and direct number should she have any concerns with scheduling appointments.

## 2018-07-17 ENCOUNTER — Telehealth: Payer: Self-pay

## 2018-07-17 DIAGNOSIS — E876 Hypokalemia: Secondary | ICD-10-CM

## 2018-07-17 DIAGNOSIS — D508 Other iron deficiency anemias: Secondary | ICD-10-CM

## 2018-07-17 DIAGNOSIS — Z79899 Other long term (current) drug therapy: Secondary | ICD-10-CM

## 2018-07-17 NOTE — Telephone Encounter (Signed)
-----   Message from Sanda Klein, MD sent at 11/15/2017  6:05 PM EDT ----- Please schedule for CBC, basic metabolic panel and TSH in 2 weeks.  Reason: anemia, hypokalemia and monitoring of amiodarone therapy.  Okay to coordinate blood draws with her other physicians. MCr

## 2018-07-17 NOTE — Telephone Encounter (Signed)
Patient is scheduled for labs on 07/26/18 (CBC, CMET). Will mail patient a TSH to take with her on the 30th with instruction to take paperwork with her to the appointment.

## 2018-07-19 ENCOUNTER — Ambulatory Visit: Payer: Medicare Other | Admitting: Hematology and Oncology

## 2018-07-19 ENCOUNTER — Other Ambulatory Visit: Payer: Medicare Other

## 2018-07-25 ENCOUNTER — Encounter: Payer: Self-pay | Admitting: Physician Assistant

## 2018-07-25 ENCOUNTER — Ambulatory Visit (INDEPENDENT_AMBULATORY_CARE_PROVIDER_SITE_OTHER): Payer: Medicare Other | Admitting: Physician Assistant

## 2018-07-25 VITALS — BP 140/85 | HR 57 | Ht 61.0 in | Wt 259.6 lb

## 2018-07-25 DIAGNOSIS — Z79899 Other long term (current) drug therapy: Secondary | ICD-10-CM | POA: Diagnosis not present

## 2018-07-25 DIAGNOSIS — R5383 Other fatigue: Secondary | ICD-10-CM

## 2018-07-25 DIAGNOSIS — I5032 Chronic diastolic (congestive) heart failure: Secondary | ICD-10-CM | POA: Diagnosis not present

## 2018-07-25 DIAGNOSIS — I484 Atypical atrial flutter: Secondary | ICD-10-CM | POA: Diagnosis not present

## 2018-07-25 MED ORDER — CHLORTHALIDONE 25 MG PO TABS: 25.0000 mg | ORAL_TABLET | Freq: Every day | ORAL | 3 refills | Status: DC

## 2018-07-25 MED ORDER — DILTIAZEM HCL ER COATED BEADS 120 MG PO CP24: 120.0000 mg | ORAL_CAPSULE | Freq: Every day | ORAL | 3 refills | Status: DC

## 2018-07-25 NOTE — Patient Instructions (Signed)
Medication Instructions:  Your physician recommends that you continue on your current medications as directed. Please refer to the Current Medication list given to you today. If you need a refill on your cardiac medications before your next appointment, please call your pharmacy.   Lab work: PATIENT WILL COMPLETE LABS AT PCP OFFICE If you have labs (blood work) drawn today and your tests are completely normal, you will receive your results only by: Marland Kitchen MyChart Message (if you have MyChart) OR . A paper copy in the mail If you have any lab test that is abnormal or we need to change your treatment, we will call you to review the results.  Testing/Procedures: NONE   Follow-Up: At [facility], you and your health needs are our priority.  As part of our continuing mission to provide you with exceptional heart care, we have created designated Provider Care Teams.  These Care Teams include your primary Cardiologist (physician) and Advanced Practice Providers (APPs -  Physician Assistants and Nurse Practitioners) who all work together to provide you with the care you need, when you need it. You will need a follow up appointment in 6 months.  Please call our office 2 months in advance to schedule this appointment.  You may see Sanda Klein, MD or one of the following Advanced Practice Providers on your designated Care Team: [place], [place] . Fabian Sharp, PA-C  Any Other Special Instructions Will Be Listed Below (If Applicable).

## 2018-07-25 NOTE — Progress Notes (Signed)
Cardiology Office Note    Date:  07/27/2018   ID:  Peggy Armstrong, DOB Feb 18, 1946, MRN [id number]  PCP:  Margarito Courser, MD  Cardiologist:  Dr. Sallyanne Kuster  Chief Complaint  Patient presents with  . Follow-up    seen for Dr. Sallyanne Kuster    History of Present Illness:  Peggy Armstrong is a 73 y.o. female with PMH of atrial flutter, possible transient atrial fibrillation, chronic diastolic heart failure, remote history of right lower extremity DVT in 2017, morbid obesity.  Patient had a 2 arrhythmia occurred in the setting of ascending cholangitis in September 2018 and was refractory to rate control.  She required amiodarone for conversion.  EKG in September is consistent with atrial flutter.  There was a EKG in September 2018 concerning for possible atrial fibrillation as well.  She was placed on amiodarone and Eliquis since.  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  Follow-up EGD in February 6384 was uncomplicated and that was normal.  Endoscopy was complicated by bleeding after the removal of small polyp.  Eliquis was temporarily stopped and by restart it by May 2019.  She has been followed by oncology since.  Patient was last seen by Dr. Sallyanne Kuster on 11/14/2017, her blood pressure was elevated, her hydrochlorothiazide was stopped and she was started on chlorthalidone instead.  There is some consideration of adding spironolactone if she does develop hypokalemia in the future.  Patient presents today for cardiology office visit.  Her blood pressure remain borderline elevated.  She says her recent potassium level was also low.  She was prescribed a higher amount of potassium supplement and plan to obtain a repeat blood work at PCPs office.  She has been fatigued recently.  There is no sign of significant anemia.  A TSH will be obtained.  Otherwise she has no lower extremity edema, orthopnea or PND.   Past Medical History:  Diagnosis Date  .  DVT (deep venous thrombosis) (HCC)    right DVT  . GERD (gastroesophageal reflux disease)   . Gout   . Gout   . Hypertension   . RA (rheumatoid arthritis) ([place])     Past Surgical History:  Procedure Laterality Date  . ABDOMINAL HYSTERECTOMY    . CHOLECYSTECTOMY    . ENDOSCOPIC RETROGRADE CHOLANGIOPANCREATOGRAPHY (ERCP) WITH PROPOFOL N/A 06/15/2017   Procedure: ENDOSCOPIC RETROGRADE CHOLANGIOPANCREATOGRAPHY (ERCP) WITH PROPOFOL;  Surgeon: [name], MD;  Location: [facility];  Service: Endoscopy;  Laterality: N/A;  . ESOPHAGOGASTRODUODENOSCOPY (EGD) WITH PROPOFOL N/A 03/24/2017   Procedure: ESOPHAGOGASTRODUODENOSCOPY (EGD) WITH PROPOFOL;  Surgeon: Doran Stabler, MD;  Location: [facility];  Service: Gastroenterology;  Laterality: N/A;  . ESOPHAGOGASTRODUODENOSCOPY (EGD) WITH PROPOFOL N/A 11/07/2017   Procedure: ESOPHAGOGASTRODUODENOSCOPY (EGD) WITH PROPOFOL;  Surgeon: [name], MD;  Location: [facility];  Service: Endoscopy;  Laterality: N/A;  . EUS N/A 04/06/2017   Procedure: UPPER ENDOSCOPIC ULTRASOUND (EUS) LINEAR;  Surgeon: [name], MD;  Location: [facility];  Service: Endoscopy;  Laterality: N/A;  to evaluate duodenal mass  . HERNIA REPAIR    . IR BILIARY DRAIN PLACEMENT WITH CHOLANGIOGRAM  03/25/2017  . IR CONVERT BILIARY DRAIN TO INT EXT BILIARY DRAIN  04/03/2017    Current Medications: Outpatient Medications Prior to Visit  Medication Sig Dispense Refill  . allopurinol (ZYLOPRIM) 300 MG tablet Take 300 mg by mouth daily.     Marland Kitchen amiodarone (PACERONE) 200 MG tablet TAKE 1 TABLET  BY MOUTH EVERY DAY 90 tablet 1  . apixaban (ELIQUIS) 5 MG TABS tablet Take 1 tablet (5 mg total) by mouth 2 (two) times daily. Restart on 11/11/2017    . atorvastatin (LIPITOR) 20 MG tablet Take 1 tablet by mouth daily.    . busPIRone (BUSPAR) 10 MG tablet Take 1 tablet by mouth 2 (two) times daily.    . cholecalciferol (VITAMIN D) 1000 units tablet Take 1,000 Units by  mouth daily.    . famotidine (PEPCID) 20 MG tablet Take 1 tablet by mouth every evening.    Marland Kitchen losartan (COZAAR) 25 MG tablet Take 1 tablet (25 mg total) by mouth daily. 30 tablet 0  . pantoprazole (PROTONIX) 40 MG tablet TAKE 1 TABLET (40 MG TOTAL) BY MOUTH 2 (TWO) TIMES DAILY BEFORE A MEAL. 60 tablet 4  . potassium chloride (K-DUR,KLOR-CON) 10 MEQ tablet Take 20 mEq by mouth daily.     Marland Kitchen PROAIR HFA 108 (90 Base) MCG/ACT inhaler Inhale 2 puffs into the lungs every 6 (six) hours as needed for wheezing or shortness of breath.   2  . sertraline (ZOLOFT) 25 MG tablet Take 25 mg by mouth daily.     Marland Kitchen tiZANidine (ZANAFLEX) 2 MG tablet Take 2 mg by mouth at bedtime.    . vitamin B-12 (CYANOCOBALAMIN) 1000 MCG tablet Take 1,000 mcg by mouth daily.    . chlorthalidone (HYGROTON) 25 MG tablet Take 1 tablet (25 mg total) by mouth daily. 90 tablet 3  . diltiazem (DILTIAZEM CD) 120 MG 24 hr capsule Take 120 mg by mouth daily.    . fluticasone (FLONASE) 50 MCG/ACT nasal spray Place 1 spray into both nostrils daily.    Marland Kitchen ALPRAZolam (XANAX) 0.25 MG tablet Take 0.25 mg by mouth at bedtime.   2  . diltiazem (CARDIZEM CD) 120 MG 24 hr capsule Take 1 capsule (120 mg total) by mouth daily. (Patient not taking: Reported on 07/25/2018) 30 capsule 0   No facility-administered medications prior to visit.      Allergies:   Codeine   Social History   Socioeconomic History  . Marital status: Divorced    Spouse name: Not on file  . Number of children: 2  . Years of education: Not on file  . Highest education level: Not on file  Occupational History  . Not on file  Social Needs  . Financial resource strain: Not on file  . Food insecurity:    Worry: Not on file    Inability: Not on file  . Transportation needs:    Medical: Not on file    Non-medical: Not on file  Tobacco Use  . Smoking status: Never Smoker  . Smokeless tobacco: Never Used  Substance and Sexual Activity  . Alcohol use: No  . Drug use: No    . 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  Lifestyle  . Physical activity:    Days per week: Not on file    Minutes per session: Not on file  . Stress: Not on file  Relationships  . Social connections:    Talks on phone: Not on file    Gets together: Not on file    Attends religious service: Not on file    Active member of club or organization: Not on file    Attends meetings of clubs or organizations: Not on file    Relationship status: Not on file  Other Topics Concern  . Not on  file  Social History Narrative  . Not on file     Family History:  The patient's family history includes Colon cancer in her father; Diabetes in her paternal grandmother; Heart disease in her paternal grandfather; Hypertension in her mother; Prostate cancer in her father; Rheum arthritis in her maternal grandmother.   ROS:   Please see the history of present illness.    ROS All other systems reviewed and are negative.   PHYSICAL EXAM:   VS:  BP 140/85   Pulse (!) 57   Ht 5\' 1"  (1.549 m)   Wt 259 lb 9.6 oz (117.8 kg)   SpO2 98%   BMI 49.05 kg/m    GEN: Well nourished, well developed, in no acute distress  HEENT: normal  Neck: no JVD, carotid bruits, or masses Cardiac: RRR; no murmurs, rubs, or gallops,no edema  Respiratory:  clear to auscultation bilaterally, normal work of breathing GI: soft, nontender, nondistended, + BS MS: no deformity or atrophy  Skin: warm and dry, no rash Neuro:  Alert and Oriented x 3, Strength and sensation are intact Psych: euthymic mood, full affect  Wt Readings from Last 3 Encounters:  07/26/18 260 lb 3.2 oz (118 kg)  07/25/18 259 lb 9.6 oz (117.8 kg)  03/01/18 262 lb (118.8 kg)      Studies/Labs Reviewed:   EKG:  EKG is ordered today.  The ekg ordered today demonstrates sinus bradycardia.  Underlying flutter wave due to tremor  Recent Labs: 11/09/2017: Magnesium 2.7 07/26/2018: ALT 11; BUN 22; Creatinine, Ser 1.45;  Hemoglobin 12.8; Platelets 214; Potassium 3.6; Sodium 141; TSH 21.170   Lipid Panel No results found for: CHOL, TRIG, HDL, CHOLHDL, VLDL, LDLCALC, LDLDIRECT  Additional studies/ records that were reviewed today include:   Echo 03/28/2017 LV EF: 60% -   65% Study Conclusions  - Left ventricle: The cavity size was normal. Wall thickness was   increased in a pattern of mild LVH. Systolic function was normal.   The estimated ejection fraction was in the range of 60% to 65%.   Wall motion was normal; there were no regional wall motion   abnormalities. Features are consistent with a pseudonormal left   ventricular filling pattern, with concomitant abnormal relaxation   and increased filling pressure (grade 2 diastolic dysfunction). - Aortic valve: Valve area (VTI): 1.61 cm^2. Valve area (Vmax):   1.64 cm^2. Valve area (Vmean): 1.41 cm^2. - Right ventricle: The cavity size was moderately dilated. Systolic   function was moderately reduced. - Right atrium: The atrium was mildly dilated.   ASSESSMENT:    1. Other fatigue   2. On amiodarone therapy   3. Atypical atrial flutter ([place])   4. Chronic diastolic (congestive) heart failure ([place])   5. Morbid obesity ([place])      PLAN:  In order of problems listed above:  1. Fatigue: No sign of anemia based on blood work.  I will obtain TSH since patient is on amiodarone  2. Atrial flutter: History of difficult to rate control atrial flutter and a transient episode of atrial fibrillation as well.  She has not had any further issues since started on amiodarone, continue Eliquis and diltiazem.  3. Hypokalemia: Recently, potassium has been low.  She was started on higher dose of potassium supplement.  Plan to obtain repeat lab tomorrow, if still low, may consider spironolactone to replace the potassium supplement  4. Chronic diastolic heart failure: Euvolemic on physical exam  5. Morbid obesity: Weight loss is recommended.  Medication  Adjustments/Labs and Tests Ordered: Current medicines are reviewed at length with the patient today.  Concerns regarding medicines are outlined above.  Medication changes, Labs and Tests ordered today are listed in the Patient Instructions below. Patient Instructions  Medication Instructions:  Your physician recommends that you continue on your current medications as directed. Please refer to the Current Medication list given to you today. If you need a refill on your cardiac medications before your next appointment, please call your pharmacy.   Lab work: PATIENT WILL COMPLETE LABS AT PCP OFFICE If you have labs (blood work) drawn today and your tests are completely normal, you will receive your results only by: Marland Kitchen MyChart Message (if you have MyChart) OR . A paper copy in the mail If you have any lab test that is abnormal or we need to change your treatment, we will call you to review the results.  Testing/Procedures: NONE   Follow-Up: At [facility], you and your health needs are our priority.  As part of our continuing mission to provide you with exceptional heart care, we have created designated Provider Care Teams.  These Care Teams include your primary Cardiologist (physician) and Advanced Practice Providers (APPs -  Physician Assistants and Nurse Practitioners) who all work together to provide you with the care you need, when you need it. You will need a follow up appointment in 6 months.  Please call our office 2 months in advance to schedule this appointment.  You may see Sanda Klein, MD or one of the following Advanced Practice Providers on your designated Care Team: [place], [place] . Fabian Sharp, PA-C  Any Other Special Instructions Will Be Listed Below (If Applicable).       Hilbert Corrigan, [place]  07/27/2018 2:52 PM    [facility] [address], [place], [place]  [address] Phone: [phone]; Fax: [phone]

## 2018-07-26 ENCOUNTER — Telehealth: Payer: Self-pay

## 2018-07-26 ENCOUNTER — Inpatient Hospital Stay: Payer: Medicare Other

## 2018-07-26 ENCOUNTER — Encounter: Payer: Self-pay | Admitting: Hematology and Oncology

## 2018-07-26 ENCOUNTER — Inpatient Hospital Stay: Payer: Medicare Other | Attending: Hematology and Oncology | Admitting: Hematology and Oncology

## 2018-07-26 DIAGNOSIS — K922 Gastrointestinal hemorrhage, unspecified: Secondary | ICD-10-CM | POA: Diagnosis not present

## 2018-07-26 DIAGNOSIS — D500 Iron deficiency anemia secondary to blood loss (chronic): Secondary | ICD-10-CM | POA: Diagnosis not present

## 2018-07-26 DIAGNOSIS — M069 Rheumatoid arthritis, unspecified: Secondary | ICD-10-CM

## 2018-07-26 DIAGNOSIS — C241 Malignant neoplasm of ampulla of Vater: Secondary | ICD-10-CM | POA: Diagnosis not present

## 2018-07-26 DIAGNOSIS — D508 Other iron deficiency anemias: Secondary | ICD-10-CM

## 2018-07-26 DIAGNOSIS — Z7901 Long term (current) use of anticoagulants: Secondary | ICD-10-CM | POA: Insufficient documentation

## 2018-07-26 DIAGNOSIS — M25511 Pain in right shoulder: Secondary | ICD-10-CM | POA: Insufficient documentation

## 2018-07-26 DIAGNOSIS — Z79899 Other long term (current) drug therapy: Secondary | ICD-10-CM | POA: Diagnosis not present

## 2018-07-26 HISTORY — DX: Pain in right shoulder: M25.511

## 2018-07-26 LAB — CBC WITH DIFFERENTIAL/PLATELET
Abs Immature Granulocytes: 0.06 10*3/uL (ref 0.00–0.07)
Basophils Absolute: 0 10*3/uL (ref 0.0–0.1)
Basophils Relative: 0 %
Eosinophils Absolute: 0.2 10*3/uL (ref 0.0–0.5)
Eosinophils Relative: 2 %
HCT: 40.5 % (ref 36.0–46.0)
Hemoglobin: 12.8 g/dL (ref 12.0–15.0)
IMMATURE GRANULOCYTES: 1 %
Lymphocytes Relative: 12 %
Lymphs Abs: 0.9 10*3/uL (ref 0.7–4.0)
MCH: 28.7 pg (ref 26.0–34.0)
MCHC: 31.6 g/dL (ref 30.0–36.0)
MCV: 90.8 fL (ref 80.0–100.0)
Monocytes Absolute: 0.7 10*3/uL (ref 0.1–1.0)
Monocytes Relative: 9 %
Neutro Abs: 5.8 10*3/uL (ref 1.7–7.7)
Neutrophils Relative %: 76 %
Platelets: 214 10*3/uL (ref 150–400)
RBC: 4.46 MIL/uL (ref 3.87–5.11)
RDW: 14.9 % (ref 11.5–15.5)
WBC: 7.6 10*3/uL (ref 4.0–10.5)
nRBC: 0 % (ref 0.0–0.2)

## 2018-07-26 LAB — COMPREHENSIVE METABOLIC PANEL
ALT: 11 U/L (ref 0–44)
AST: 13 U/L — ABNORMAL LOW (ref 15–41)
Albumin: 3.8 g/dL (ref 3.5–5.0)
Alkaline Phosphatase: 111 U/L (ref 38–126)
Anion gap: 11 (ref 5–15)
BUN: 22 mg/dL (ref 8–23)
CHLORIDE: 102 mmol/L (ref 98–111)
CO2: 28 mmol/L (ref 22–32)
Calcium: 9.7 mg/dL (ref 8.9–10.3)
Creatinine, Ser: 1.45 mg/dL — ABNORMAL HIGH (ref 0.44–1.00)
GFR calc Af Amer: 41 mL/min — ABNORMAL LOW (ref >60)
GFR calc non Af Amer: 36 mL/min — ABNORMAL LOW (ref >60)
Glucose, Bld: 105 mg/dL — ABNORMAL HIGH (ref 70–99)
POTASSIUM: 3.6 mmol/L (ref 3.5–5.1)
SODIUM: 141 mmol/L (ref 135–145)
Total Bilirubin: 0.6 mg/dL (ref 0.3–1.2)
Total Protein: 7.5 g/dL (ref 6.5–8.1)

## 2018-07-26 LAB — IRON AND TIBC
Iron: 56 ug/dL (ref 41–142)
Saturation Ratios: 20 % — ABNORMAL LOW (ref 21–57)
TIBC: 283 ug/dL (ref 236–444)
UIBC: 227 ug/dL (ref 120–384)

## 2018-07-26 LAB — FERRITIN: Ferritin: 63 ng/mL (ref 11–307)

## 2018-07-26 NOTE — Telephone Encounter (Signed)
Left below message and potassium results. Ask her to call the office for questions.

## 2018-07-26 NOTE — Assessment & Plan Note (Signed)
She had history of recurrent iron deficiency anemia due to intermittent GI bleed Her blood count has been stable for a long time and she has not needed intravenous iron for almost a year Since that she has close follow-up with multiple different doctors, I recommend blood count monitoring through her primary care doctor's office If she developed recurrent iron deficiency anemia again requiring intravenous iron infusion, I will see her back.  She agree with the plan of care

## 2018-07-26 NOTE — Progress Notes (Signed)
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[facility]) She is not symptomatic and has no signs of anemia She has appointment to follow at [facility] I will defer to them for further follow-up  Iron deficiency anemia She had history of recurrent iron deficiency anemia due to intermittent GI bleed Her blood count has been stable for a long time and she has not needed intravenous iron for almost a year Since that she has close follow-up with multiple different doctors, I recommend blood count monitoring through her primary care doctor's office If she developed recurrent iron deficiency anemia again requiring intravenous iron infusion, I will see her back.  She agree with the plan of care  Right shoulder pain She had recent right shoulder pain.  The patient had background history of rheumatoid arthritis She has appointment to see her primary care doctor again soon.  I would defer to them for further management   No orders of the defined types were placed in this encounter.   INTERVAL HISTORY: Please see below for problem oriented charting. She returns for further follow-up She complains of right shoulder pain Her appetite has been stable Denies significant weight loss No recent changes in bowel habits The patient denies any recent signs or symptoms of bleeding such as spontaneous epistaxis, hematuria or hematochezia.   SUMMARY OF ONCOLOGIC HISTORY:   Cancer of ampulla of Vater ([place])   03/22/2017 Imaging    1. Mild intra hepatic and moderate extrahepatic biliary dilatation, post cholecystectomy. Recommend correlation with laboratory values, follow-up MR or ERCP as indicated 2. Sigmoid colon diverticular disease without acute inflammation.     03/22/2017 -  03/30/2017 Hospital Admission    She was admitted for management of biliary obstruction    03/23/2017 Imaging    1. Mild to moderately motion degraded exam. 2. Biliary duct dilatation after cholecystectomy. No choledocholithiasis or well-defined obstructive mass. Given the elevated bilirubin, consider further evaluation with ERCP to exclude ampullary stricture or otherwise occult ampullary lesion. 3. Hiatal hernia. 4. Iron deposition in the liver    03/24/2017 Procedure    She underwent EGD which revealed ulcerated mass in 2nd portion of duodenum    03/24/2017 Pathology Results    Duodenum, Biopsy, duodenum mass - ADENOMA. - NO HIGH GRADE DYSPLASIA OR MALIGNANCY    03/24/2017 Procedure    Status post percutaneous transhepatic cholangiogram with placement of a left approach externalized biliary drain.    03/29/2017 Imaging    CT chest 1. Small to moderate layering bilateral pleural effusions are new since 03/23/2017. Superimposed pulmonary atelectasis with no definite pneumonia. 2. Rounded but fluid density 3 cm area in the mediastinum anterior to the carina is favored to be pericardial fluid in a superior pericardial recess (normal variant). A follow-up Chest CT with IV contrast (such as in 3 months time) is recommended to document stability. 3. Partially visible upper abdomen transhepatic biliary drain with no adverse features. 4. Cardiomegaly.    04/03/2017 Procedure    Status post routine exchange of left hepatic approach external biliary drain for a new, modified, 10 [place] internal/ external drain.     04/06/2017 Pathology Results    Duodenum, Biopsy, peri ampullary mass - DUODENAL TUBULOVILLOUS ADENOMA. - NO HIGH GRADE DYSPLASIA OR INVASIVE CARCINOMA    04/06/2017  Procedure    EUS - 3.5cm soft, round ampullary neoplasm without evident deep invasion into the pancreas, duodenal wall. I repeated mucosal biopsies of the mass which previously showed adenoma without HGD. I also  performed EUS FNA from deeper within the mass, await final cytology results. - Capped internal/external biliary drain is in good position extending into the duodenum lumen from within the mass.    04/06/2017 Pathology Results    FINE NEEDLE ASPIRATION, ENDOSCOPIC PERI AMPULLARY MASS ( SPECIMEN 1 OF 1 COLLECTED 04/06/2017) ATYPICAL GLANDULAR CELLS, SEE COMMENT. 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 The lesion was sampled with forceps extensively.  - The previously placed platic biliary stent was removed with a snare and sent for cytology review. - Cholangiogram revealed diffusely dilated extrahepatic bile duct with a distal 5-71mm CBD stricture. - 4cm long, 68mm covered SEMS was placed across the stricture in good position      06/16/2017 Pathology Results    Duodenum, Biopsy, ampullary - DUODENAL TUBULOVILLOUS ADENOMA. - THERE IS NO EVIDENCE OF HIGH GRADE DYSPLASIA.     REVIEW OF SYSTEMS:   Constitutional: Denies fevers, chills or abnormal weight loss Eyes: Denies blurriness of vision Ears, nose, mouth, throat, and face: Denies mucositis or sore throat Respiratory: Denies cough, dyspnea or wheezes Cardiovascular: Denies palpitation, chest discomfort or lower extremity swelling Gastrointestinal:  Denies nausea, heartburn or change in bowel habits Skin: Denies abnormal skin rashes Lymphatics: Denies new lymphadenopathy or easy bruising Neurological:Denies numbness, tingling or new weaknesses Behavioral/Psych: Mood is stable, no new changes  All other systems were reviewed with the patient  and are negative.  I have reviewed the past medical history, past surgical history, social history and family history with the patient and they are unchanged from previous note.  ALLERGIES:  is allergic to codeine.  MEDICATIONS:  Current Outpatient Medications  Medication Sig Dispense Refill  . allopurinol (ZYLOPRIM) 300 MG tablet Take 300 mg by mouth daily.     Marland Kitchen amiodarone (PACERONE) 200 MG tablet TAKE 1 TABLET BY MOUTH EVERY DAY 90 tablet 1  . apixaban (ELIQUIS) 5 MG TABS tablet Take 1 tablet (5 mg total) by mouth 2 (two) times daily. Restart on 11/11/2017    . atorvastatin (LIPITOR) 20 MG tablet Take 1 tablet by mouth daily.    . busPIRone (BUSPAR) 10 MG tablet Take 1 tablet by mouth 2 (two) times daily.    . chlorthalidone (HYGROTON) 25 MG tablet Take 1 tablet (25 mg total) by mouth daily. 90 tablet 3  . cholecalciferol (VITAMIN D) 1000 units tablet Take 1,000 Units by mouth daily.    Marland Kitchen diltiazem (DILTIAZEM CD) 120 MG 24 hr capsule Take 1 capsule (120 mg total) by mouth daily. 90 capsule 3  . famotidine (PEPCID) 20 MG tablet Take 1 tablet by mouth every evening.    Marland Kitchen losartan (COZAAR) 25 MG tablet Take 1 tablet (25 mg total) by mouth daily. 30 tablet 0  . pantoprazole (PROTONIX) 40 MG tablet TAKE 1 TABLET (40 MG TOTAL) BY MOUTH 2 (TWO) TIMES DAILY BEFORE A MEAL. 60 tablet 4  . potassium chloride (K-DUR,KLOR-CON) 10 MEQ tablet Take 20 mEq by mouth  daily.     Marland Kitchen PROAIR HFA 108 (90 Base) MCG/ACT inhaler Inhale 2 puffs into the lungs every 6 (six) hours as needed for wheezing or shortness of breath.   2  . sertraline (ZOLOFT) 25 MG tablet Take 25 mg by mouth daily.     Marland Kitchen tiZANidine (ZANAFLEX) 2 MG tablet Take 2 mg by mouth at bedtime.    . vitamin B-12 (CYANOCOBALAMIN) 1000 MCG tablet Take 1,000 mcg by mouth daily.     No current facility-administered medications for this visit.     PHYSICAL EXAMINATION: ECOG PERFORMANCE STATUS: 1 - Symptomatic but completely ambulatory  Vitals:    07/26/18 1301  BP: (!) 153/93  Pulse: 67  Resp: 18  Temp: (!) 97.5 F (36.4 C)  SpO2: 98%   Filed Weights   07/26/18 1301  Weight: 260 lb 3.2 oz (118 kg)    GENERAL:alert, no distress and comfortable NEURO: alert & oriented x 3 with fluent speech, no focal motor/sensory deficits  LABORATORY DATA:  I have reviewed the data as listed    Component Value Date/Time   NA 141 07/26/2018 1232   NA 144 08/14/2017 1651   NA 142 08/11/2016 0936   K 3.6 07/26/2018 1232   K 3.4 (L) 08/11/2016 0936   CL 102 07/26/2018 1232   CO2 28 07/26/2018 1232   CO2 25 08/11/2016 0936   GLUCOSE 105 (H) 07/26/2018 1232   GLUCOSE 119 08/11/2016 0936   BUN 22 07/26/2018 1232   BUN 20 08/14/2017 1651   BUN 23.3 08/11/2016 0936   CREATININE 1.45 (H) 07/26/2018 1232   CREATININE 1.3 (H) 08/11/2016 0936   CALCIUM 9.7 07/26/2018 1232   CALCIUM 9.6 08/11/2016 0936   PROT 7.5 07/26/2018 1232   PROT 6.8 08/11/2016 0936   ALBUMIN 3.8 07/26/2018 1232   ALBUMIN 3.3 (L) 08/11/2016 0936   AST 13 (L) 07/26/2018 1232   AST 16 08/11/2016 0936   ALT 11 07/26/2018 1232   ALT 18 08/11/2016 0936   ALKPHOS 111 07/26/2018 1232   ALKPHOS 121 08/11/2016 0936   BILITOT 0.6 07/26/2018 1232   BILITOT 0.36 08/11/2016 0936   GFRNONAA 36 (L) 07/26/2018 1232   GFRAA 41 (L) 07/26/2018 1232    No results found for: SPEP, UPEP  Lab Results  Component Value Date   WBC 7.6 07/26/2018   NEUTROABS 5.8 07/26/2018   HGB 12.8 07/26/2018   HCT 40.5 07/26/2018   MCV 90.8 07/26/2018   PLT 214 07/26/2018      Chemistry      Component Value Date/Time   NA 141 07/26/2018 1232   NA 144 08/14/2017 1651   NA 142 08/11/2016 0936   K 3.6 07/26/2018 1232   K 3.4 (L) 08/11/2016 0936   CL 102 07/26/2018 1232   CO2 28 07/26/2018 1232   CO2 25 08/11/2016 0936   BUN 22 07/26/2018 1232   BUN 20 08/14/2017 1651   BUN 23.3 08/11/2016 0936   CREATININE 1.45 (H) 07/26/2018 1232   CREATININE 1.3 (H) 08/11/2016 0936       Component Value Date/Time   CALCIUM 9.7 07/26/2018 1232   CALCIUM 9.6 08/11/2016 0936   ALKPHOS 111 07/26/2018 1232   ALKPHOS 121 08/11/2016 0936   AST 13 (L) 07/26/2018 1232   AST 16 08/11/2016 0936   ALT 11 07/26/2018 1232   ALT 18 08/11/2016 0936   BILITOT 0.6 07/26/2018 1232   BILITOT 0.36 08/11/2016 0936       All questions  were answered. The patient knows to call the clinic with any problems, questions or concerns. No barriers to learning was detected.  I spent 10 minutes counseling the patient face to face. The total time spent in the appointment was 15 minutes and more than 50% was on counseling and review of test results  Heath Lark, MD 07/26/2018 3:49 PM

## 2018-07-26 NOTE — Telephone Encounter (Signed)
-----   Message from Heath Lark, MD sent at 07/26/2018  1:20 PM EST ----- Regarding: potassium ok Let her know her potassium is ok ----- Message ----- From: Interface, Lab In [facility] Sent: 07/26/2018  12:44 PM EST To: Heath Lark, MD

## 2018-07-26 NOTE — Assessment & Plan Note (Signed)
She is not symptomatic and has no signs of anemia She has appointment to follow at [facility] I will defer to them for further follow-up

## 2018-07-26 NOTE — Assessment & Plan Note (Signed)
She had recent right shoulder pain.  The patient had background history of rheumatoid arthritis She has appointment to see her primary care doctor again soon.  I would defer to them for further management

## 2018-07-27 ENCOUNTER — Encounter: Payer: Self-pay | Admitting: Physician Assistant

## 2018-07-27 ENCOUNTER — Telehealth: Payer: Self-pay | Admitting: Cardiovascular Disease

## 2018-07-27 LAB — TSH: TSH: 21.17 u[IU]/mL — ABNORMAL HIGH (ref 0.450–4.500)

## 2018-07-27 NOTE — Telephone Encounter (Signed)
Patient called w/results. She voiced understanding. She sees PCP on Monday. Routed lab results to him via [place] fax.   Status:  Final result  Visible to patient:  No (Not Released)  Next appt:  None  Dx:  Other fatigue   Ref Range & Units 1d ago  TSH 0.450 - 4.500 uIU/mL 21.170High    Resulting Agency  [facility]    Narrative  Performed by: [facility]  Performed at: 01 - [facility] [address] [place], [place] [id number] Lab Director: Rush Farmer MD, Phone: [phone]    Specimen Collected: 07/26/18 00:00 Last Resulted: 07/27/18 05:36

## 2018-07-27 NOTE — Telephone Encounter (Signed)
Routed to [place] PA to review labs in [facility]

## 2018-07-27 NOTE — Telephone Encounter (Signed)
For some reason, the lab was not routed back to me. TSH very high, suggest low thyroid level. (as TSH is opposite of the true thyroid level). This is likely what causes her fatigue. Please forward to PCP and have them manage. Culprit maybe amiodarone which can cause hypothyroidism, will forward to Dr. Sallyanne Kuster to see if amiodarone can be switched to another medication

## 2018-07-27 NOTE — Telephone Encounter (Signed)
New message     Pt wants test results for thyroid. Pt got tests done at cancer center

## 2018-07-28 NOTE — Telephone Encounter (Signed)
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 However, the best way to manage it is probably just to provide levothyroxine supplementation. She had atypical atrial flutter, unlikely to be easy to ablate. She was very hard to rate control. Only amio helped. MCr

## 2018-07-30 ENCOUNTER — Telehealth: Payer: Self-pay | Admitting: Cardiovascular Disease

## 2018-07-30 NOTE — Telephone Encounter (Signed)
New message   Patient's daughter is returning call for lab results.

## 2018-07-30 NOTE — Telephone Encounter (Signed)
I called patient, advised of lab results, and to have PCP notified of levothyroxine medication. Patient daughter verbalized understanding. PCP appointment was today, labs were faxed to PCP office to his attention. Patient daughter will call back to notify if the PCP received and if medication was started as mentioned by Dr.Croitoru.

## 2018-07-31 ENCOUNTER — Other Ambulatory Visit: Payer: Self-pay

## 2018-07-31 MED ORDER — LEVOTHYROXINE SODIUM 50 MCG PO TABS: 50.0000 ug | ORAL_TABLET | Freq: Every day | ORAL | 6 refills | Status: DC

## 2019-01-08 ENCOUNTER — Telehealth: Payer: Self-pay | Admitting: *Deleted

## 2019-01-08 NOTE — Telephone Encounter (Signed)
A message was left, re: follow up visit. 

## 2019-01-20 ENCOUNTER — Other Ambulatory Visit: Payer: Self-pay | Admitting: Cardiovascular Disease

## 2019-02-07 ENCOUNTER — Other Ambulatory Visit: Payer: Self-pay | Admitting: Cardiovascular Disease

## 2019-05-09 ENCOUNTER — Ambulatory Visit: Payer: Medicare Other | Admitting: Cardiovascular Disease

## 2019-05-10 ENCOUNTER — Telehealth: Payer: Self-pay

## 2019-05-10 ENCOUNTER — Encounter: Payer: Self-pay | Admitting: Physician Assistant

## 2019-05-10 ENCOUNTER — Telehealth (INDEPENDENT_AMBULATORY_CARE_PROVIDER_SITE_OTHER): Payer: Medicare Other | Admitting: Physician Assistant

## 2019-05-10 VITALS — BP 138/72 | Ht 61.0 in

## 2019-05-10 DIAGNOSIS — I484 Atypical atrial flutter: Secondary | ICD-10-CM

## 2019-05-10 DIAGNOSIS — I824Y9 Acute embolism and thrombosis of unspecified deep veins of unspecified proximal lower extremity: Secondary | ICD-10-CM

## 2019-05-10 DIAGNOSIS — I5032 Chronic diastolic (congestive) heart failure: Secondary | ICD-10-CM

## 2019-05-10 NOTE — Patient Instructions (Signed)
Medication Instructions:   Your physician recommends that you continue on your current medications as directed. Please refer to the Current Medication list given to you today.  *If you need a refill on your cardiac medications before your next appointment, please call your pharmacy*  Lab Work:  NONE ordered at this time of appointment   If you have labs (blood work) drawn today and your tests are completely normal, you will receive your results only by: Marland Kitchen MyChart Message (if you have MyChart) OR . A paper copy in the mail If you have any lab test that is abnormal or we need to change your treatment, we will call you to review the results.  Testing/Procedures:  NONE ordered at this time of appointment   Follow-Up: At [facility], you and your health needs are our priority.  As part of our continuing mission to provide you with exceptional heart care, we have created designated Provider Care Teams.  These Care Teams include your primary Cardiologist (physician) and Advanced Practice Providers (APPs -  Physician Assistants and Nurse Practitioners) who all work together to provide you with the care you need, when you need it.  Your next appointment:   6 months  The format for your next appointment:   Virtual Visit   Provider:   Sanda Klein, MD  Other Instructions

## 2019-05-10 NOTE — Telephone Encounter (Signed)
Called patient to discuss AVS instructions gave Peggy Armstrong's recommendations and patient voiced understanding. AVS summary mailed to patient.  AVS mailed out  

## 2019-05-10 NOTE — Progress Notes (Signed)
Virtual Visit via Telephone Note   This visit type was conducted due to national recommendations for restrictions regarding the COVID-19 Pandemic (e.g. social distancing) in an effort to limit this patient's exposure and mitigate transmission in our community.  Due to her co-morbid illnesses, this patient is at least at moderate risk for complications without adequate follow up.  This format is felt to be most appropriate for this patient at this time.  The patient did not have access to video technology/had technical difficulties with video requiring transitioning to audio format only (telephone).  All issues noted in this document were discussed and addressed.  No physical exam could be performed with this format.  Please refer to the patient's chart for her  consent to telehealth for [facility].   Date:  05/12/2019   ID:  Peggy Armstrong, DOB 15-Jun-1946, MRN [id number]  Patient Location: Home Provider Location: Home  PCP:  Peggy Courser, MD  Cardiologist:  Peggy Klein, MD  Electrophysiologist:  None   Evaluation Performed:  Follow-Up Visit  Chief Complaint:  followup  History of Present Illness:    Peggy Armstrong is a 73 y.o. female with PMH of atrial flutter, possible transient atrial fibrillation, chronic diastolic heart failure, remote history of right lower extremity DVT in 2017, morbid obesity.  Patient had arrhythmia occurred in the setting of ascending cholangitis in September 2018 and was refractory to rate control.  She required amiodarone for conversion.  EKG in September is consistent with atrial flutter.  There was a EKG in September 2018 concerning for possible atrial fibrillation as well.  She was placed on amiodarone and Eliquis since.  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  Follow-up EGD in February [id number] was uncomplicated and that was normal.  Endoscopy was complicated by bleeding after the removal of small  polyp.  Eliquis was temporarily stopped and by restart it by May 2019.  She has been followed by oncology since.  Patient was last seen by Peggy Armstrong on 11/14/2017, her blood pressure was elevated, her hydrochlorothiazide was stopped and she was started on chlorthalidone instead.  There is some consideration of adding spironolactone if she does develop hypokalemia in the future.  Patient was contacted today via telephone visit.  She denies any recent chest pain, shortness of breath or palpitation.  She has no lower extremity edema, orthopnea or PND.  The patient does not have symptoms concerning for COVID-19 infection (fever, chills, cough, or new shortness of breath).    Past Medical History:  Diagnosis Date  . DVT (deep venous thrombosis) (H[facility])    right DVT  . GERD (gastroesophageal reflux disease)   . Gout   . Gout   . Hypertension   . RA (rheumatoid arthritis) ([place])    Past Surgical History:  Procedure Laterality Date  . ABDOMINAL HYSTERECTOMY    . CHOLECYSTECTOMY    . ENDOSCOPIC RETROGRADE CHOLANGIOPANCREATOGRAPHY (ERCP) WITH PROPOFOL N/A 06/15/2017   Procedure: ENDOSCOPIC RETROGRADE CHOLANGIOPANCREATOGRAPHY (ERCP) WITH PROPOFOL;  Surgeon: Peggy Banister, MD;  Location: [facility] ENDOSCOPY;  Service: Endoscopy;  Laterality: N/A;  . ESOPHAGOGASTRODUODENOSCOPY (EGD) WITH PROPOFOL N/A 03/24/2017   Procedure: ESOPHAGOGASTRODUODENOSCOPY (EGD) WITH PROPOFOL;  Surgeon: Peggy Stabler, MD;  Location: [facility] END[facility];  Service: Gastroenterology;  Laterality: N/A;  . ESOPHAGOGASTRODUODENOSCOPY (EGD) WITH PROPOFOL N/A 11/07/2017   Procedure: ESOPHAGOGASTRODUODENOSCOPY (EGD) WITH PROPOFOL;  Surgeon: Peggy Banister, MD;  Location: [facility] END[facility];  Service: Endoscopy;  Laterality:  N/A;  . EUS N/A 04/06/2017   Procedure: UPPER ENDOSCOPIC ULTRASOUND (EUS) LINEAR;  Surgeon: Peggy Banister, MD;  Location: [facility] ENDOSCOPY;  Service: Endoscopy;  Laterality: N/A;  to evaluate duodenal mass  . HERNIA REPAIR     . IR BILIARY DRAIN PLACEMENT WITH CHOLANGIOGRAM  03/25/2017  . IR CONVERT BILIARY DRAIN TO INT EXT BILIARY DRAIN  04/03/2017     Current Meds  Medication Sig  . allopurinol (ZYLOPRIM) 300 MG tablet Take 300 mg by mouth daily.   Marland Kitchen amiodarone (PACERONE) 200 MG tablet TAKE 1 TABLET BY MOUTH EVERY DAY  . apixaban (ELIQUIS) 5 MG TABS tablet Take 1 tablet (5 mg total) by mouth 2 (two) times daily. Restart on 11/11/2017  . atorvastatin (LIPITOR) 20 MG tablet Take 1 tablet by mouth daily.  . busPIRone (BUSPAR) 10 MG tablet Take 1 tablet by mouth 2 (two) times daily.  . chlorthalidone (HYGROTON) 25 MG tablet Take 1 tablet (25 mg total) by mouth daily.  . cholecalciferol (VITAMIN D) 1000 units tablet Take 1,000 Units by mouth daily.  Marland Kitchen diltiazem (DILTIAZEM CD) 120 MG 24 hr capsule Take 1 capsule (120 mg total) by mouth daily.  . famotidine (PEPCID) 20 MG tablet Take 1 tablet by mouth every evening.  Marland Kitchen levothyroxine (SYNTHROID) 50 MCG tablet TAKE 1 TABLET BY MOUTH DAILY BEFORE BREAKFAST  . losartan (COZAAR) 25 MG tablet Take 1 tablet (25 mg total) by mouth daily.  . pantoprazole (PROTONIX) 40 MG tablet TAKE 1 TABLET (40 MG TOTAL) BY MOUTH 2 (TWO) TIMES DAILY BEFORE A MEAL.  Marland Kitchen potassium chloride (K-DUR,KLOR-CON) 10 MEQ tablet Take 20 mEq by mouth daily.   Marland Kitchen PROAIR HFA 108 (90 Base) MCG/ACT inhaler Inhale 2 puffs into the lungs every 6 (six) hours as needed for wheezing or shortness of breath.   . sertraline (ZOLOFT) 25 MG tablet Take 25 mg by mouth daily.   . vitamin B-12 (CYANOCOBALAMIN) 1000 MCG tablet Take 1,000 mcg by mouth daily.     Allergies:   Codeine   Social History   Tobacco Use  . Smoking status: Never Smoker  . Smokeless tobacco: Never Used  Substance Use Topics  . Alcohol use: No  . Drug use: No     Family Hx: The patient's family history includes Colon cancer in her father; Diabetes in her paternal grandmother; Heart disease in her paternal grandfather; Hypertension in her  mother; Prostate cancer in her father; Rheum arthritis in her maternal grandmother.  ROS:   Please see the history of present illness.     All other systems reviewed and are negative.   Prior CV studies:   The following studies were reviewed today:  Echo 03/28/2017 LV EF: 60% -   65% Study Conclusions  - Left ventricle: The cavity size was normal. Wall thickness was   increased in a pattern of mild LVH. Systolic function was normal.   The estimated ejection fraction was in the range of 60% to 65%.   Wall motion was normal; there were no regional wall motion   abnormalities. Features are consistent with a pseudonormal left   ventricular filling pattern, with concomitant abnormal relaxation   and increased filling pressure (grade 2 diastolic dysfunction). - Aortic valve: Valve area (VTI): 1.61 cm^2. Valve area (Vmax):   1.64 cm^2. Valve area (Vmean): 1.41 cm^2. - Right ventricle: The cavity size was moderately dilated. Systolic   function was moderately reduced. - Right atrium: The atrium was mildly dilated.   Labs/Other  Tests and Data Reviewed:    EKG:  An ECG dated 07/25/2018 was personally reviewed today and demonstrated:  Atrial flutter  Recent Labs: 07/26/2018: ALT 11; BUN 22; Creatinine, Ser 1.45; Hemoglobin 12.8; Platelets 214; Potassium 3.6; Sodium 141; TSH 21.170   Recent Lipid Panel No results found for: CHOL, TRIG, HDL, CHOLHDL, LDLCALC, LDLDIRECT  Wt Readings from Last 3 Encounters:  07/26/18 260 lb 3.2 oz (118 kg)  07/25/18 259 lb 9.6 oz (117.8 kg)  03/01/18 262 lb (118.8 kg)     Objective:    Vital Signs:  BP 138/72   Ht 5\' 1"  (1.549 m)   BMI 49.16 kg/m    VITAL SIGNS:  reviewed  ASSESSMENT & PLAN:    1. Atrial flutter: Rate controlled.  Continue amiodarone and Eliquis.  2. Chronic diastolic heart failure: Euvolemic on physical exam  3. History of DVT: No recurrence  4. Morbid obesity: Diet and exercise   COVID-19 Education: The signs and  symptoms of COVID-19 were discussed with the patient and how to seek care for testing (follow up with PCP or arrange E-visit).  The importance of social distancing was discussed today.  Time:   Today, I have spent 8 minutes with the patient with telehealth technology discussing the above problems.     Medication Adjustments/Labs and Tests Ordered: Current medicines are reviewed at length with the patient today.  Concerns regarding medicines are outlined above.   Tests Ordered: No orders of the defined types were placed in this encounter.   Medication Changes: No orders of the defined types were placed in this encounter.   Follow Up:  Either In Person or Virtual in 6 month(s)  Signed, Almyra Deforest, [place]  05/12/2019 12:18 AM    [place]

## 2019-05-10 NOTE — Telephone Encounter (Signed)

## 2019-07-18 DIAGNOSIS — Z23 Encounter for immunization: Secondary | ICD-10-CM | POA: Insufficient documentation

## 2019-07-18 HISTORY — DX: Encounter for immunization: Z23

## 2019-10-28 ENCOUNTER — Other Ambulatory Visit: Payer: Self-pay | Admitting: Cardiovascular Disease

## 2019-11-08 ENCOUNTER — Encounter: Payer: Self-pay | Admitting: Cardiovascular Disease

## 2019-11-08 ENCOUNTER — Telehealth (INDEPENDENT_AMBULATORY_CARE_PROVIDER_SITE_OTHER): Payer: Medicare Other | Admitting: Cardiovascular Disease

## 2019-11-08 VITALS — BP 130/76 | Ht 61.0 in

## 2019-11-08 DIAGNOSIS — N1832 Chronic kidney disease, stage 3b: Secondary | ICD-10-CM

## 2019-11-08 DIAGNOSIS — Z86718 Personal history of other venous thrombosis and embolism: Secondary | ICD-10-CM

## 2019-11-08 DIAGNOSIS — I5033 Acute on chronic diastolic (congestive) heart failure: Secondary | ICD-10-CM | POA: Insufficient documentation

## 2019-11-08 DIAGNOSIS — I484 Atypical atrial flutter: Secondary | ICD-10-CM | POA: Diagnosis not present

## 2019-11-08 DIAGNOSIS — Z79899 Other long term (current) drug therapy: Secondary | ICD-10-CM

## 2019-11-08 DIAGNOSIS — I5032 Chronic diastolic (congestive) heart failure: Secondary | ICD-10-CM

## 2019-11-08 DIAGNOSIS — C241 Malignant neoplasm of ampulla of Vater: Secondary | ICD-10-CM

## 2019-11-08 DIAGNOSIS — I10 Essential (primary) hypertension: Secondary | ICD-10-CM

## 2019-11-08 DIAGNOSIS — Z5181 Encounter for therapeutic drug level monitoring: Secondary | ICD-10-CM

## 2019-11-08 HISTORY — DX: Morbid (severe) obesity due to excess calories: E66.01

## 2019-11-08 HISTORY — DX: Personal history of other venous thrombosis and embolism: Z86.718

## 2019-11-08 HISTORY — DX: Chronic diastolic (congestive) heart failure: I50.32

## 2019-11-08 NOTE — Progress Notes (Signed)
Virtual Visit via Telephone Note   This visit type was conducted due to national recommendations for restrictions regarding the COVID-19 Pandemic (e.g. social distancing) in an effort to limit this patient's exposure and mitigate transmission in our community.  Due to her co-morbid illnesses, this patient is at least at moderate risk for complications without adequate follow up.  This format is felt to be most appropriate for this patient at this time.  The patient did not have access to video technology/had technical difficulties with video requiring transitioning to audio format only (telephone).  All issues noted in this document were discussed and addressed.  No physical exam could be performed with this format.  Please refer to the patient's chart for her  consent to telehealth for [facility]   Date:  11/08/2019   ID:  Peggy Armstrong, DOB 03-22-1946, MRN [id number]  Patient Location: Home Provider Location: Home  PCP:  Margarito Courser, MD  Cardiologist:  Sanda Klein, MD  Electrophysiologist:  None   Evaluation Performed:  Follow-Up Visit  Chief Complaint:  followup  History of Present Illness:    Peggy Armstrong is a 74 y.o. female with PMH of atrial flutter, possible transient atrial fibrillation, chronic anticoagulation and amiodarone therapy with treated hypothyroidism, cancer of the ampulla of Vater, history of recurrent iron deficiency anemia, chronic diastolic heart failure, CKD stage IIIb, remote history of right lower extremity DVT in 2017, morbid obesity.    Patient had arrhythmia occurred in the setting of ascending cholangitis in September 2018 and was refractory to rate control.  She required amiodarone for conversion.  EKG in September is consistent with atrial flutter.  There was a EKG in September 2018 concerning for possible atrial fibrillation as well.  She was placed on amiodarone and Eliquis since.    She is generally done quite well from a cardiovascular  point of view.  She has mild exertional dyspnea which is attributable to morbid obesity, but denies orthopnea, PND, leg edema.  She does not have problems with dizziness or syncope.  She has not been aware of palpitations and has not seen extremely low or extremely high heart rates when she checks her blood pressure.  Her major complaints are related to arthritis which has bothered her in her legs and in her back and currently is most severe in her right shoulder.  She had a fall last October but none in the last 6 months and has not had any serious injuries or bleeding problems.  Most recent blood counts were normal without evidence of iron deficiency.  After some adjustment in her levothyroxine dose her most recent TSH in January 2021 was in normal range.  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  The patient does not have symptoms concerning for COVID-19 infection (fever, chills, cough, or new shortness of breath).    Past Medical History:  Diagnosis Date  . DVT (deep venous thrombosis) (HCC)    right DVT  . GERD (gastroesophageal reflux disease)   . Gout   . Gout   . Hypertension   . RA (rheumatoid arthritis) ([place])    Past Surgical History:  Procedure Laterality Date  . ABDOMINAL HYSTERECTOMY    . CHOLECYSTECTOMY    . ENDOSCOPIC RETROGRADE CHOLANGIOPANCREATOGRAPHY (ERCP) WITH PROPOFOL N/A 06/15/2017   Procedure: ENDOSCOPIC RETROGRADE CHOLANGIOPANCREATOGRAPHY (ERCP) WITH PROPOFOL;  Surgeon: [name], MD;  Location: [facility];  Service: Endoscopy;  Laterality: N/A;  . ESOPHAGOGASTRODUODENOSCOPY (EGD) WITH  PROPOFOL N/A 03/24/2017   Procedure: ESOPHAGOGASTRODUODENOSCOPY (EGD) WITH PROPOFOL;  Surgeon: Doran Stabler, MD;  Location: [facility];  Service: Gastroenterology;  Laterality: N/A;  . ESOPHAGOGASTRODUODENOSCOPY (EGD) WITH PROPOFOL N/A 11/07/2017   Procedure: ESOPHAGOGASTRODUODENOSCOPY (EGD) WITH PROPOFOL;  Surgeon: [name], MD;   [facility];  Service: Endoscopy;  Laterality: N/A;  . EUS N/A 04/06/2017   Procedure: UPPER ENDOSCOPIC ULTRASOUND (EUS) LINEAR;  Surgeon: [name], MD;  [facility];  Service: Endoscopy;  Laterality: N/A;  to evaluate duodenal mass  . HERNIA REPAIR    . IR BILIARY DRAIN PLACEMENT WITH CHOLANGIOGRAM  03/25/2017  . IR CONVERT BILIARY DRAIN TO INT EXT BILIARY DRAIN  04/03/2017     Current Meds  Medication Sig  . allopurinol (ZYLOPRIM) 300 MG tablet Take 300 mg by mouth daily.   Marland Kitchen amiodarone (PACERONE) 200 MG tablet TAKE 1 TABLET BY MOUTH EVERY DAY  . apixaban (ELIQUIS) 5 MG TABS tablet Take 1 tablet (5 mg total) by mouth 2 (two) times daily. Restart on 11/11/2017  . atorvastatin (LIPITOR) 20 MG tablet Take 1 tablet by mouth daily.  . busPIRone (BUSPAR) 10 MG tablet Take 1 tablet by mouth 2 (two) times daily.  . cholecalciferol (VITAMIN D) 1000 units tablet Take 1,000 Units by mouth daily.  Marland Kitchen diltiazem (CARDIZEM CD) 120 MG 24 hr capsule Take 1 capsule (120 mg total) by mouth daily.  . famotidine (PEPCID) 20 MG tablet Take 1 tablet by mouth every evening.  Marland Kitchen levothyroxine (SYNTHROID) 75 MCG tablet Take 75 mcg by mouth daily before breakfast.  . losartan (COZAAR) 25 MG tablet Take 1 tablet (25 mg total) by mouth daily.  . pantoprazole (PROTONIX) 40 MG tablet TAKE 1 TABLET (40 MG TOTAL) BY MOUTH 2 (TWO) TIMES DAILY BEFORE A MEAL.  Marland Kitchen potassium chloride (K-DUR,KLOR-CON) 10 MEQ tablet Take 20 mEq by mouth daily.   Marland Kitchen PROAIR HFA 108 (90 Base) MCG/ACT inhaler Inhale 2 puffs into the lungs every 6 (six) hours as needed for wheezing or shortness of breath.   . vitamin B-12 (CYANOCOBALAMIN) 1000 MCG tablet Take 1,000 mcg by mouth daily.     Allergies:   Codeine   Social History   Tobacco Use  . Smoking status: Never Smoker  . Smokeless tobacco: Never Used  Substance Use Topics  . Alcohol use: No  . Drug use: No     Family Hx: The patient's family history includes  Colon cancer in her father; Diabetes in her paternal grandmother; Heart disease in her paternal grandfather; Hypertension in her mother; Prostate cancer in her father; Rheum arthritis in her maternal grandmother.  ROS:   Please see the history of present illness.     All other systems reviewed and are negative.   Prior CV studies:   The following studies were reviewed today:  Echo 03/28/2017 LV EF: 60% -   65% Study Conclusions  - Left ventricle: The cavity size was normal. Wall thickness was   increased in a pattern of mild LVH. Systolic function was normal.   The estimated ejection fraction was in the range of 60% to 65%.   Wall motion was normal; there were no regional wall motion   abnormalities. Features are consistent with a pseudonormal left   ventricular filling pattern, with concomitant abnormal relaxation   and increased filling pressure (grade 2 diastolic dysfunction). - Aortic valve: Valve area (VTI): 1.61 cm^2. Valve area (Vmax):   1.64 cm^2. Valve area (Vmean): 1.41 cm^2. -  Right ventricle: The cavity size was moderately dilated. Systolic   function was moderately reduced. - Right atrium: The atrium was mildly dilated.   Labs/Other Tests and Data Reviewed:    EKG:  Sinus bradycardia at 57 bpm, pronounced artifact due to tremor.  Recent Labs: No results found for requested labs within last 8760 hours.  07/18/2019 Glucose 91, creatinine 1.49, normal electrolytes and liver function tests, hemoglobin 11.5, TSH 3.25 Recent Lipid Panel No results found for: CHOL, TRIG, HDL, CHOLHDL, LDLCALC, LDLDIRECT 07/18/2019 Total cholesterol 101, HDL 50, LDL 64, triglycerides 101  Wt Readings from Last 3 Encounters:  07/26/18 260 lb 3.2 oz (118 kg)  07/25/18 259 lb 9.6 oz (117.8 kg)  03/01/18 262 lb (118.8 kg)     Objective:    Vital Signs:  BP 130/76   Ht 5\' 1"  (1.549 m)   BMI 49.16 kg/m    VITAL SIGNS:  reviewed  ASSESSMENT & PLAN:    1. Atypical atrial  flutter ([place])   2. Chronic diastolic heart failure ([place])   3. History of DVT (deep vein thrombosis)   4. Morbid obesity ([place])   5. Encounter for monitoring amiodarone therapy   6. Cancer of ampulla of Vater ([place])   7. Essential hypertension   8. Stage 3b chronic kidney disease      1. Atrial flutter: It is unclear whether she is in sinus rhythm or atrial flutter at this point but regardless her rate is well controlled and she is asymptomatic.  Continue Eliquis and amiodarone.  I asked her to make sure to call us if she notices a heart rate less than 50 or greater than 100 since this may require adjustment in her dose of amiodarone and/or diltiazem. 2. Chronic diastolic heart failure: Unable to examine, but she does not have any symptoms of volume overload. 3. History of DVT: On chronic anticoagulation with Eliquis.  Asymptomatic 4. Morbid obesity: Likely underlies her tendency to develop atrial flutter.  No overt symptoms of daytime hypersomnolence or other evidence of sleep apnea, although she is clearly at risk for this disorder. 5. Amiodarone: Is quite likely that her hypothyroidism is related to treatment with amiodarone.  Normal TSH on levothyroxine supplementation, most recently checked in January 2021.  Check every 6 months. 6. History of carcinoma of ampulla of Vater: She is not recently anemic, despite chronic anticoagulation.  Followed at [facility]. 7. HTN: Adequate control.  Continue losartan, chlorthalidone, diltiazem (the latter can be discontinued and replaced with amlodipine if she develops excessive bradycardia). 8. CKD 3B: Creatinine is stable or slightly improved from past assays.   COVID-19 Education: The signs and symptoms of COVID-19 were discussed with the patient and how to seek care for testing (follow up with PCP or arrange E-visit).  The importance of social distancing was discussed today.  Time:   Today, I have spent 22 minutes with the patient with telehealth technology  discussing the above problems.     Medication Adjustments/Labs and Tests Ordered: Current medicines are reviewed at length with the patient today.  Concerns regarding medicines are outlined above.   Tests Ordered: No orders of the defined types were placed in this encounter.   Medication Changes: No orders of the defined types were placed in this encounter.   Follow Up: In person in 6 months  Signed, Sanda Klein, MD  11/08/2019 8:14 AM    [place]

## 2019-11-08 NOTE — Patient Instructions (Addendum)
Medication Instructions:  No changes *If you need a refill on your cardiac medications before your next appointment, please call your pharmacy*   Lab Work: None ordered If you have labs (blood work) drawn today and your tests are completely normal, you will receive your results only by: Marland Kitchen MyChart Message (if you have MyChart) OR . A paper copy in the mail If you have any lab test that is abnormal or we need to change your treatment, we will call you to review the results.   Testing/Procedures: None ordered   Follow-Up: At [facility], you and your health needs are our priority.  As part of our continuing mission to provide you with exceptional heart care, we have created designated Provider Care Teams.  These Care Teams include your primary Cardiologist (physician) and Advanced Practice Providers (APPs -  Physician Assistants and Nurse Practitioners) who all work together to provide you with the care you need, when you need it.  We recommend signing up for the patient portal called "MyChart".  Sign up information is provided on this After Visit Summary.  MyChart is used to connect with patients for Virtual Visits (Telemedicine).  Patients are able to view lab/test results, encounter notes, upcoming appointments, etc.  Non-urgent messages can be sent to your provider as well.   To learn more about what you can do with MyChart, go to [email or web address].    Your next appointment:   12 month(s)  The format for your next appointment:   In Person  Provider:   You may see Sanda Klein, MD or one of the following Advanced Practice Providers on your designated Care Team:    Almyra Deforest, PA-C  Fabian Sharp, [place] or   Roby Lofts, [place]    Other Instructions Please call the office at [phone] if your heart rate gets below 50 or over 100 beats per minute.

## 2019-12-13 DIAGNOSIS — Z0000 Encounter for general adult medical examination without abnormal findings: Secondary | ICD-10-CM

## 2019-12-13 HISTORY — DX: Encounter for general adult medical examination without abnormal findings: Z00.00

## 2020-01-05 ENCOUNTER — Other Ambulatory Visit: Payer: Self-pay | Admitting: Cardiovascular Disease

## 2020-03-31 ENCOUNTER — Other Ambulatory Visit: Payer: Self-pay | Admitting: Cardiovascular Disease

## 2020-05-11 ENCOUNTER — Telehealth: Payer: Self-pay | Admitting: Cardiovascular Disease

## 2020-05-11 NOTE — Telephone Encounter (Signed)
Patient confirmed and OK'd virtual appt on 11/17 with Almyra Deforest. It said consent was needed in appt notes.

## 2020-05-13 ENCOUNTER — Telehealth (INDEPENDENT_AMBULATORY_CARE_PROVIDER_SITE_OTHER): Payer: Medicare Other | Admitting: Physician Assistant

## 2020-05-13 ENCOUNTER — Telehealth: Payer: Self-pay

## 2020-05-13 VITALS — BP 128/72 | HR 88

## 2020-05-13 DIAGNOSIS — Z86718 Personal history of other venous thrombosis and embolism: Secondary | ICD-10-CM | POA: Diagnosis not present

## 2020-05-13 DIAGNOSIS — I5032 Chronic diastolic (congestive) heart failure: Secondary | ICD-10-CM

## 2020-05-13 DIAGNOSIS — I484 Atypical atrial flutter: Secondary | ICD-10-CM

## 2020-05-13 NOTE — Progress Notes (Signed)
Virtual Visit via Telephone Note   This visit type was conducted due to national recommendations for restrictions regarding the COVID-19 Pandemic (e.g. social distancing) in an effort to limit this patient's exposure and mitigate transmission in our community.  Due to her co-morbid illnesses, this patient is at least at moderate risk for complications without adequate follow up.  This format is felt to be most appropriate for this patient at this time.  The patient did not have access to video technology/had technical difficulties with video requiring transitioning to audio format only (telephone).  All issues noted in this document were discussed and addressed.  No physical exam could be performed with this format.  Please refer to the patient's chart for her  consent to telehealth for [facility].    Date:  05/15/2020   ID:  Peggy Armstrong, DOB Dec 14, 1945, MRN [id number] The patient was identified using 2 identifiers.  Patient Location: Home Provider Location: Home Office  PCP:  Margarito Courser, MD  Cardiologist:  Sanda Klein, MD  Electrophysiologist:  None   Evaluation Performed:  Follow-Up Visit  Chief Complaint:  Follow up  History of Present Illness:    Peggy Armstrong is a 74 y.o. female with PMH ofatrial flutter, possible transient atrial fibrillation, chronic diastolic heart failure, remote history of right lower extremity DVT in 2017, morbid obesity. Patient had arrhythmia occurred in the setting of ascending cholangitis in September 2018 and was refractory to rate control. She required amiodarone for conversion. EKG in Sept 2018 was consistent with atrial flutter. There was a EKG in September 2018 concerning for possible atrial fibrillation as well. She was placed on amiodarone and Eliquis since. 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 Follow-up EGD in February 0109 was uncomplicated and was normal. Endoscopy  was complicated by bleeding after the removal of small polyp. Eliquis was temporarily stopped and but restarted by May 2019. She has been followed by oncology since.   Patient was last seen virtually by Dr. Sallyanne Kuster on 11/08/2019 at which time she was doing well.  Patient presents today for follow-up.  She denies any recent palpitation.  Her lab work is being followed closely by her PCP.  She denies any chest pain or shortness of breath.  Overall, she is doing well from cardiac perspective and can follow-up in 6 months.  She does have a lot of pain in her joints including her left shoulder and her knee.  She is doing conservative management at this time.  She may discuss with her PCP about her joint pain to see if it is osteoarthritis versus rheumatoid arthritis.  She got her flu shot on November 14 and plan to get her Covid booster shot on December 1.   The patient does not have symptoms concerning for COVID-19 infection (fever, chills, cough, or new shortness of breath).    Past Medical History:  Diagnosis Date  . DVT (deep venous thrombosis) (HCC)    right DVT  . GERD (gastroesophageal reflux disease)   . Gout   . Gout   . Hypertension   . RA (rheumatoid arthritis) ([place])    Past Surgical History:  Procedure Laterality Date  . ABDOMINAL HYSTERECTOMY    . CHOLECYSTECTOMY    . ENDOSCOPIC RETROGRADE CHOLANGIOPANCREATOGRAPHY (ERCP) WITH PROPOFOL N/A 06/15/2017   Procedure: ENDOSCOPIC RETROGRADE CHOLANGIOPANCREATOGRAPHY (ERCP) WITH PROPOFOL;  Surgeon: [name], MD;  [facility];  Service: Endoscopy;  Laterality: N/A;  .  ESOPHAGOGASTRODUODENOSCOPY (EGD) WITH PROPOFOL N/A 03/24/2017   Procedure: ESOPHAGOGASTRODUODENOSCOPY (EGD) WITH PROPOFOL;  Surgeon: Doran Stabler, MD;  [facility];  Service: Gastroenterology;  Laterality: N/A;  . ESOPHAGOGASTRODUODENOSCOPY (EGD) WITH PROPOFOL N/A 11/07/2017   Procedure: ESOPHAGOGASTRODUODENOSCOPY (EGD) WITH PROPOFOL;   Surgeon: [name], MD;  [facility];  Service: Endoscopy;  Laterality: N/A;  . EUS N/A 04/06/2017   Procedure: UPPER ENDOSCOPIC ULTRASOUND (EUS) LINEAR;  Surgeon: [name], MD;  [facility];  Service: Endoscopy;  Laterality: N/A;  to evaluate duodenal mass  . HERNIA REPAIR    . IR BILIARY DRAIN PLACEMENT WITH CHOLANGIOGRAM  03/25/2017  . IR CONVERT BILIARY DRAIN TO INT EXT BILIARY DRAIN  04/03/2017     Current Meds  Medication Sig  . allopurinol (ZYLOPRIM) 300 MG tablet Take 300 mg by mouth daily.   Marland Kitchen amiodarone (PACERONE) 200 MG tablet TAKE 1 TABLET BY MOUTH EVERY DAY  . apixaban (ELIQUIS) 5 MG TABS tablet Take 1 tablet (5 mg total) by mouth 2 (two) times daily. Restart on 11/11/2017  . atorvastatin (LIPITOR) 20 MG tablet Take 1 tablet by mouth daily.  . busPIRone (BUSPAR) 10 MG tablet Take 1 tablet by mouth 2 (two) times daily.  . cholecalciferol (VITAMIN D) 1000 units tablet Take 1,000 Units by mouth daily.  Marland Kitchen diltiazem (CARDIZEM CD) 120 MG 24 hr capsule TAKE 1 CAPSULE BY MOUTH EVERY DAY  . famotidine (PEPCID) 20 MG tablet Take 1 tablet by mouth every evening.  Marland Kitchen levothyroxine (SYNTHROID) 75 MCG tablet Take 75 mcg by mouth daily before breakfast.  . losartan (COZAAR) 25 MG tablet Take 1 tablet (25 mg total) by mouth daily.  . pantoprazole (PROTONIX) 40 MG tablet TAKE 1 TABLET (40 MG TOTAL) BY MOUTH 2 (TWO) TIMES DAILY BEFORE A MEAL. (Patient taking differently: Take 40 mg by mouth daily. )  . potassium chloride (K-DUR,KLOR-CON) 10 MEQ tablet Take 20 mEq by mouth daily.   . sertraline (ZOLOFT) 25 MG tablet Take 25 mg by mouth daily.   . vitamin B-12 (CYANOCOBALAMIN) 1000 MCG tablet Take 1,000 mcg by mouth daily.     Allergies:   Codeine   Social History   Tobacco Use  . Smoking status: Never Smoker  . Smokeless tobacco: Never Used  Vaping Use  . Vaping Use: Never used  Substance Use Topics  . Alcohol use: No  . Drug use: No     Family  Hx: The patient's family history includes Colon cancer in her father; Diabetes in her paternal grandmother; Heart disease in her paternal grandfather; Hypertension in her mother; Prostate cancer in her father; Rheum arthritis in her maternal grandmother.  ROS:   Please see the history of present illness.     All other systems reviewed and are negative.   Prior CV studies:   The following studies were reviewed today:  Echo 03/28/2017 LV EF: 60% -  65%   -------------------------------------------------------------------  Indications:   Atrial flutter 427.32.   -------------------------------------------------------------------  History:  PMH: No prior cardiac history.   -------------------------------------------------------------------  Study Conclusions   - Left ventricle: The cavity size was normal. Wall thickness was  increased in a pattern of mild LVH. Systolic function was normal.  The estimated ejection fraction was in the range of 60% to 65%.  Wall motion was normal; there were no regional wall motion  abnormalities. Features are consistent with a pseudonormal left  ventricular filling pattern, with concomitant abnormal relaxation  and increased filling  pressure (grade 2 diastolic dysfunction).  - Aortic valve: Valve area (VTI): 1.61 cm^2. Valve area (Vmax):  1.64 cm^2. Valve area (Vmean): 1.41 cm^2.  - Right ventricle: The cavity size was moderately dilated. Systolic  function was moderately reduced.  - Right atrium: The atrium was mildly dilated.   Labs/Other Tests and Data Reviewed:    EKG:  An ECG dated 07/25/2018 was personally reviewed today and demonstrated:  questionable sinus rhythm with underlying flutter wave due to tremor  Recent Labs: No results found for requested labs within last 8760 hours.   Recent Lipid Panel No results found for: CHOL, TRIG, HDL, CHOLHDL, LDLCALC, LDLDIRECT  Wt Readings from Last 3 Encounters:  07/26/18 260  lb 3.2 oz (118 kg)  07/25/18 259 lb 9.6 oz (117.8 kg)  03/01/18 262 lb (118.8 kg)     Risk Assessment/Calculations:     CHA2DS2-VASc Score = 3  This indicates a 3.2% annual risk of stroke. The patient's score is based upon: CHF History: 1 HTN History: 0 Diabetes History: 0 Stroke History: 0 Vascular Disease History: 0 Age Score: 1 Gender Score: 1     Objective:    Vital Signs:  BP 128/72   Pulse 88    VITAL SIGNS:  reviewed  ASSESSMENT & PLAN:    1. Atrial flutter: Continue on current therapy with Eliquis and the diltiazem  2. Chronic diastolic heart failure: No sign of significant heart failure symptoms recently  3. History of DVT: No recent recurrence.  On Eliquis  COVID-19 Education: The signs and symptoms of COVID-19 were discussed with the patient and how to seek care for testing (follow up with PCP or arrange E-visit).  The importance of social distancing was discussed today.  Time:   Today, I have spent 10 minutes with the patient with telehealth technology discussing the above problems.     Medication Adjustments/Labs and Tests Ordered: Current medicines are reviewed at length with the patient today.  Concerns regarding medicines are outlined above.   Tests Ordered: No orders of the defined types were placed in this encounter.   Medication Changes: No orders of the defined types were placed in this encounter.   Follow Up:  In Person in 6 month(s)  Signed, Almyra Deforest, [place]  05/15/2020 11:39 PM    [facility]

## 2020-05-13 NOTE — Patient Instructions (Addendum)
Medication Instructions:  No changes *If you need a refill on your cardiac medications before your next appointment, please call your pharmacy*   Lab Work: None ordered If you have labs (blood work) drawn today and your tests are completely normal, you will receive your results only by: Marland Kitchen MyChart Message (if you have MyChart) OR . A paper copy in the mail If you have any lab test that is abnormal or we need to change your treatment, we will call you to review the results.   Testing/Procedures: None ordered   Follow-Up: At [facility], you and your health needs are our priority.  As part of our continuing mission to provide you with exceptional heart care, we have created designated Provider Care Teams.  These Care Teams include your primary Cardiologist (physician) and Advanced Practice Providers (APPs -  Physician Assistants and Nurse Practitioners) who all work together to provide you with the care you need, when you need it.  We recommend signing up for the patient portal called "MyChart".  Sign up information is provided on this After Visit Summary.  MyChart is used to connect with patients for Virtual Visits (Telemedicine).  Patients are able to view lab/test results, encounter notes, upcoming appointments, etc.  Non-urgent messages can be sent to your provider as well.   To learn more about what you can do with MyChart, go to [email or web address].    Your next appointment:   6 month(s)  The format for your next appointment:   In Person  Provider:   Sanda Klein, MD   Other Instructions None

## 2020-05-13 NOTE — Telephone Encounter (Signed)
Ms. Peggy Armstrong, Peggy Armstrong are scheduled for a virtual visit with your provider today.    Just as we do with appointments in the office, we must obtain your consent to participate.  Your consent will be active for this visit and any virtual visit you may have with one of our providers in the next 365 days.    If you have a MyChart account, I can also send a copy of this consent to you electronically.  All virtual visits are billed to your insurance company just like a traditional visit in the office.  As this is a virtual visit, video technology does not allow for your provider to perform a traditional examination.  This may limit your provider's ability to fully assess your condition.  If your provider identifies any concerns that need to be evaluated in person or the need to arrange testing such as labs, EKG, etc, we will make arrangements to do so.    Although advances in technology are sophisticated, we cannot ensure that it will always work on either your end or our end.  If the connection with a video visit is poor, we may have to switch to a telephone visit.  With either a video or telephone visit, we are not always able to ensure that we have a secure connection.   I need to obtain your verbal consent now.   Are you willing to proceed with your visit today?   Peggy Armstrong has provided verbal consent on 05/13/2020 for a virtual visit (video or telephone).   [place], RN 05/13/2020  9:00 AM

## 2020-05-13 NOTE — Telephone Encounter (Signed)
Left message for pt stating that her AVS would be mailed to her home and to please call our office if she has any questions or concerns.

## 2020-09-28 ENCOUNTER — Ambulatory Visit: Payer: Medicare Other | Admitting: Dermatology

## 2020-09-28 ENCOUNTER — Other Ambulatory Visit: Payer: Self-pay

## 2020-10-11 ENCOUNTER — Other Ambulatory Visit: Payer: Self-pay | Admitting: Cardiovascular Disease

## 2020-10-12 NOTE — Telephone Encounter (Signed)
Rx has been sent to the pharmacy electronically. ° °

## 2020-10-28 ENCOUNTER — Telehealth: Payer: Self-pay

## 2020-10-28 ENCOUNTER — Telehealth (INDEPENDENT_AMBULATORY_CARE_PROVIDER_SITE_OTHER): Payer: Medicare Other | Admitting: Physician Assistant

## 2020-10-28 ENCOUNTER — Encounter: Payer: Self-pay | Admitting: Physician Assistant

## 2020-10-28 VITALS — BP 128/78 | HR 84 | Ht 61.0 in | Wt 225.0 lb

## 2020-10-28 DIAGNOSIS — I5032 Chronic diastolic (congestive) heart failure: Secondary | ICD-10-CM

## 2020-10-28 DIAGNOSIS — Z86718 Personal history of other venous thrombosis and embolism: Secondary | ICD-10-CM

## 2020-10-28 DIAGNOSIS — I484 Atypical atrial flutter: Secondary | ICD-10-CM | POA: Diagnosis not present

## 2020-10-28 NOTE — Patient Instructions (Signed)
Medication Instructions:  Your physician recommends that you continue on your current medications as directed. Please refer to the Current Medication list given to you today.  *If you need a refill on your cardiac medications before your next appointment, please call your pharmacy*  Lab Work: NONE ordered at this time of appointment   If you have labs (blood work) drawn today and your tests are completely normal, you will receive your results only by: . MyChart Message (if you have MyChart) OR . A paper copy in the mail If you have any lab test that is abnormal or we need to change your treatment, we will call you to review the results.  Testing/Procedures: NONE ordered at this time of appointment   Follow-Up: At [facility], you and your health needs are our priority.  As part of our continuing mission to provide you with exceptional heart care, we have created designated Provider Care Teams.  These Care Teams include your primary Cardiologist (physician) and Advanced Practice Providers (APPs -  Physician Assistants and Nurse Practitioners) who all work together to provide you with the care you need, when you need it.  We recommend signing up for the patient portal called "MyChart".  Sign up information is provided on this After Visit Summary.  MyChart is used to connect with patients for Virtual Visits (Telemedicine).  Patients are able to view lab/test results, encounter notes, upcoming appointments, etc.  Non-urgent messages can be sent to your provider as well.   To learn more about what you can do with MyChart, go to [email or web address].    Your next appointment:   6 month(s)  The format for your next appointment:   In Person  Provider:   Mihai Croitoru, MD  Other Instructions   

## 2020-10-28 NOTE — Progress Notes (Signed)
Virtual Visit via Telephone Note   This visit type was conducted due to national recommendations for restrictions regarding the COVID-19 Pandemic (e.g. social distancing) in an effort to limit this patient's exposure and mitigate transmission in our community.  Due to her co-morbid illnesses, this patient is at least at moderate risk for complications without adequate follow up.  This format is felt to be most appropriate for this patient at this time.  The patient did not have access to video technology/had technical difficulties with video requiring transitioning to audio format only (telephone).  All issues noted in this document were discussed and addressed.  No physical exam could be performed with this format.  Please refer to the patient's chart for her  consent to telehealth for [facility].    Date:  10/30/2020   ID:  Peggy Armstrong, DOB [birth year], MRN [id number] The patient was identified using 2 identifiers.  Patient Location: Home Provider Location: Office/Clinic   PCP:  Margarito Courser, MD   [facility] Providers Cardiologist:  Sanda Klein, MD {  Evaluation Performed:  Follow-Up Visit  Chief Complaint:  Follow up  History of Present Illness:    Peggy Armstrong is a 75 y.o. female with PMH ofatrial flutter, possible transient atrial fibrillation, chronic diastolic heart failure, remote history of right lower extremity DVT in 2017, and morbid obesity. Patient had arrhythmia occurred in the setting of ascending cholangitis in September 2018 and was refractory to rate control. She required amiodarone for conversion. EKG in Sept 2018 was consistent with atrial flutter. There was a EKG in September 2018 concerning for possible atrial fibrillation as well. She was placed on amiodarone and Eliquis since. 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 Follow-up EGD in February 4034 was uncomplicated and was normal.  Endoscopy was complicated by bleeding after the removal of small polyp. Eliquis was temporarily stopped but restarted by May 2019. She has been followed by oncology since.   She was last seen via virtual visit in November 2021 at which time she was doing well.  Patient presents today for virtual visit.  She denies any chest discomfort, palpitation, lower extremity edema, orthopnea or PND.  Overall she has been doing well from cardiology perspective.  The patient does not have symptoms concerning for COVID-19 infection (fever, chills, cough, or new shortness of breath).    Past Medical History:  Diagnosis Date  . DVT (deep venous thrombosis) (HCC)    right DVT  . GERD (gastroesophageal reflux disease)   . Gout   . Gout   . Hypertension   . RA (rheumatoid arthritis) ([place])    Past Surgical History:  Procedure Laterality Date  . ABDOMINAL HYSTERECTOMY    . CHOLECYSTECTOMY    . ENDOSCOPIC RETROGRADE CHOLANGIOPANCREATOGRAPHY (ERCP) WITH PROPOFOL N/A 06/15/2017   Procedure: ENDOSCOPIC RETROGRADE CHOLANGIOPANCREATOGRAPHY (ERCP) WITH PROPOFOL;  Surgeon: [name], MD;  Location: [facility];  Service: Endoscopy;  Laterality: N/A;  . ESOPHAGOGASTRODUODENOSCOPY (EGD) WITH PROPOFOL N/A 03/24/2017   Procedure: ESOPHAGOGASTRODUODENOSCOPY (EGD) WITH PROPOFOL;  Surgeon: Doran Stabler, MD;  Location: [facility];  Service: Gastroenterology;  Laterality: N/A;  . ESOPHAGOGASTRODUODENOSCOPY (EGD) WITH PROPOFOL N/A 11/07/2017   Procedure: ESOPHAGOGASTRODUODENOSCOPY (EGD) WITH PROPOFOL;  Surgeon: [name], MD;  Location: [facility];  Service: Endoscopy;  Laterality: N/A;  . EUS N/A 04/06/2017   Procedure: UPPER ENDOSCOPIC ULTRASOUND (EUS) LINEAR;  Surgeon: [name], MD;  Location: [facility];  Service: Endoscopy;  Laterality: N/A;  to evaluate duodenal mass  . HERNIA REPAIR    . IR BILIARY DRAIN PLACEMENT WITH CHOLANGIOGRAM  03/25/2017  . IR CONVERT BILIARY DRAIN TO INT EXT  BILIARY DRAIN  04/03/2017     Current Meds  Medication Sig  . allopurinol (ZYLOPRIM) 300 MG tablet Take 300 mg by mouth daily.   Marland Kitchen amiodarone (PACERONE) 200 MG tablet TAKE 1 TABLET BY MOUTH EVERY DAY  . apixaban (ELIQUIS) 5 MG TABS tablet Take 1 tablet (5 mg total) by mouth 2 (two) times daily. Restart on 11/11/2017  . atorvastatin (LIPITOR) 20 MG tablet Take 1 tablet by mouth daily.  . busPIRone (BUSPAR) 10 MG tablet Take 1 tablet by mouth 2 (two) times daily.  . chlorthalidone (HYGROTON) 25 MG tablet Take 1 tablet (25 mg total) by mouth daily.  . cholecalciferol (VITAMIN D) 1000 units tablet Take 1,000 Units by mouth daily.  Marland Kitchen diltiazem (CARDIZEM CD) 120 MG 24 hr capsule TAKE 1 CAPSULE BY MOUTH EVERY DAY  . famotidine (PEPCID) 20 MG tablet Take 1 tablet by mouth every evening.  Marland Kitchen levothyroxine (SYNTHROID) 75 MCG tablet Take 75 mcg by mouth daily before breakfast.  . losartan (COZAAR) 25 MG tablet Take 1 tablet (25 mg total) by mouth daily.  . pantoprazole (PROTONIX) 40 MG tablet Take 40 mg by mouth daily.  . potassium chloride (K-DUR,KLOR-CON) 10 MEQ tablet Take 20 mEq by mouth daily.   . sertraline (ZOLOFT) 25 MG tablet Take 25 mg by mouth daily.   Marland Kitchen tiZANidine (ZANAFLEX) 2 MG tablet Take by mouth. 1/2 tablet TID PRN  . vitamin B-12 (CYANOCOBALAMIN) 1000 MCG tablet Take 1,000 mcg by mouth daily.     Allergies:   Codeine   Social History   Tobacco Use  . Smoking status: Never Smoker  . Smokeless tobacco: Never Used  Vaping Use  . Vaping Use: Never used  Substance Use Topics  . Alcohol use: No  . Drug use: No     Family Hx: The patient's family history includes Colon cancer in her father; Diabetes in her paternal grandmother; Heart disease in her paternal grandfather; Hypertension in her mother; Prostate cancer in her father; Rheum arthritis in her maternal grandmother.  ROS:   Please see the history of present illness.     All other systems reviewed and are  negative.   Prior CV studies:   The following studies were reviewed today:  Echo 03/28/2017 LV EF: 60% -  65%   Study Conclusions   - Left ventricle: The cavity size was normal. Wall thickness was  increased in a pattern of mild LVH. Systolic function was normal.  The estimated ejection fraction was in the range of 60% to 65%.  Wall motion was normal; there were no regional wall motion  abnormalities. Features are consistent with a pseudonormal left  ventricular filling pattern, with concomitant abnormal relaxation  and increased filling pressure (grade 2 diastolic dysfunction).  - Aortic valve: Valve area (VTI): 1.61 cm^2. Valve area (Vmax):  1.64 cm^2. Valve area (Vmean): 1.41 cm^2.  - Right ventricle: The cavity size was moderately dilated. Systolic  function was moderately reduced.  - Right atrium: The atrium was mildly dilated.   Labs/Other Tests and Data Reviewed:    EKG:  An ECG dated 07/25/2018 was personally reviewed today and demonstrated:  Sinus bradycardia without significant ST-T wave changes.  Recent Labs: No results found for requested labs within last 8760 hours.   Recent Lipid  Panel No results found for: CHOL, TRIG, HDL, CHOLHDL, LDLCALC, LDLDIRECT  Wt Readings from Last 3 Encounters:  10/28/20 225 lb (102.1 kg)  07/26/18 260 lb 3.2 oz (118 kg)  07/25/18 259 lb 9.6 oz (117.8 kg)     Risk Assessment/Calculations:      Objective:    Vital Signs:  BP 128/78   Pulse 84   Ht 5\' 1"  (1.549 m)   Wt 225 lb (102.1 kg)   BMI 42.51 kg/m    VITAL SIGNS:  reviewed  ASSESSMENT & PLAN:    1. Atrial flutter: Continue Eliquis and diltiazem  2. Chronic diastolic heart failure: No recent heart failure symptoms  3. History of DVT: No recurrence.  On Eliquis.   COVID-19 Education: The signs and symptoms of COVID-19 were discussed with the patient and how to seek care for testing (follow up with PCP or arrange E-visit).  The importance of social  distancing was discussed today.  Time:   Today, I have spent 5 minutes with the patient with telehealth technology discussing the above problems.     Medication Adjustments/Labs and Tests Ordered: Current medicines are reviewed at length with the patient today.  Concerns regarding medicines are outlined above.   Tests Ordered: No orders of the defined types were placed in this encounter.   Medication Changes: No orders of the defined types were placed in this encounter.   Follow Up:  In Person in 6 month(s)  Signed, Almyra Deforest, [place]  10/30/2020 10:42 PM    [place]

## 2020-10-28 NOTE — Telephone Encounter (Signed)
Called patient to discuss AVS instructions gave Hao Meng, PA-C recommendations and patient voiced understanding. AVS summary mailed to patient.    

## 2020-11-03 NOTE — Progress Notes (Signed)
Thank you.  Patients on amiodarone need to have liver and thyroid function test checked every 6 months (she had normal labs on 05/27/2020 and will be due to have a recheck early next month).  I also remind them they need a yearly eye exam, remind him that amiodarone has a lot of drug interactions that they should always be wary of, remind him that amiodarone causes photosensitivity and they should avoid prolonged sun exposure.  Finally it is always a good idea to remind him that any unexplained respiratory complaints should be promptly reported to their physician.

## 2021-03-11 ENCOUNTER — Telehealth: Payer: Self-pay | Admitting: Cardiovascular Disease

## 2021-03-11 NOTE — Telephone Encounter (Signed)
Left message to call back  

## 2021-03-11 NOTE — Telephone Encounter (Signed)
Ew Message:    Daughter would like to know if patient can have a Virtual Visit instead of coming in?Marland Kitchen

## 2021-03-11 NOTE — Telephone Encounter (Signed)
Daughter calling to request if pt's December appointment can be switched to virtual. She state pt fell in March and hasn't fully recovered. She report pt is not having any cardiac concerns but feels a virtual visit would be much easier.   Will forward to nurse for approval.

## 2021-03-12 NOTE — Telephone Encounter (Signed)
Left a message for the patient's daughter to call back. The patient will need to be seen in the office for an EKG. She is currently on amiodarone and has not been seen in the office for two years.

## 2021-03-12 NOTE — Telephone Encounter (Signed)
Patient's daughter returned call. Informed her the patient will need to come in for an ekg. She had no further questions.

## 2021-05-27 ENCOUNTER — Ambulatory Visit: Payer: Medicare Other | Admitting: Cardiovascular Disease

## 2021-07-22 ENCOUNTER — Ambulatory Visit: Payer: Medicare Other | Admitting: Physician Assistant

## 2021-08-30 ENCOUNTER — Other Ambulatory Visit: Payer: Self-pay | Admitting: Cardiovascular Disease

## 2021-10-28 ENCOUNTER — Ambulatory Visit: Payer: Medicare Other | Admitting: Cardiovascular Disease

## 2021-11-26 ENCOUNTER — Ambulatory Visit (INDEPENDENT_AMBULATORY_CARE_PROVIDER_SITE_OTHER): Payer: Medicare Other | Admitting: Family

## 2021-11-26 VITALS — BP 142/88 | HR 61 | Ht 61.0 in | Wt 230.4 lb

## 2021-11-26 DIAGNOSIS — I5032 Chronic diastolic (congestive) heart failure: Secondary | ICD-10-CM

## 2021-11-26 DIAGNOSIS — Z79899 Other long term (current) drug therapy: Secondary | ICD-10-CM

## 2021-11-26 DIAGNOSIS — D6859 Other primary thrombophilia: Secondary | ICD-10-CM

## 2021-11-26 DIAGNOSIS — I10 Essential (primary) hypertension: Secondary | ICD-10-CM | POA: Diagnosis not present

## 2021-11-26 DIAGNOSIS — I484 Atypical atrial flutter: Secondary | ICD-10-CM

## 2021-11-26 DIAGNOSIS — Z86718 Personal history of other venous thrombosis and embolism: Secondary | ICD-10-CM

## 2021-11-26 MED ORDER — DILTIAZEM HCL ER COATED BEADS 120 MG PO CP24: 120.0000 mg | ORAL_CAPSULE | Freq: Every day | ORAL | 3 refills | Status: DC

## 2021-11-26 MED ORDER — AMIODARONE HCL 200 MG PO TABS: 200.0000 mg | ORAL_TABLET | Freq: Every day | ORAL | 3 refills | Status: DC

## 2021-11-26 MED ORDER — CHLORTHALIDONE 25 MG PO TABS: 25.0000 mg | ORAL_TABLET | Freq: Every day | ORAL | 3 refills | Status: DC

## 2021-11-26 NOTE — Patient Instructions (Signed)
Medication Instructions:  Continue your current medications.   *If you need a refill on your cardiac medications before your next appointment, please call your pharmacy*   Lab Work: Your physician recommends that you return for lab work today: liver enzymes  If you have labs (blood work) drawn today and your tests are completely normal, you will receive your results only by: MyChart Message (if you have MyChart) OR A paper copy in the mail If you have any lab test that is abnormal or we need to change your treatment, we will call you to review the results.   Testing/Procedures: Your EKG today showed normal sinus rhythm.    Follow-Up: At [facility], you and your health needs are our priority.  As part of our continuing mission to provide you with exceptional heart care, we have created designated Provider Care Teams.  These Care Teams include your primary Cardiologist (physician) and Advanced Practice Providers (APPs -  Physician Assistants and Nurse Practitioners) who all work together to provide you with the care you need, when you need it.  We recommend signing up for the patient portal called "MyChart".  Sign up information is provided on this After Visit Summary.  MyChart is used to connect with patients for Virtual Visits (Telemedicine).  Patients are able to view lab/test results, encounter notes, upcoming appointments, etc.  Non-urgent messages can be sent to your provider as well.   To learn more about what you can do with MyChart, go to [email or web address].    Your next appointment:   1 year(s)  The format for your next appointment:   In Person  Provider:   Sanda Klein, MD     Other Instructions  Heart Healthy Diet Recommendations: A low-salt diet is recommended. Meats should be grilled, baked, or boiled. Avoid fried foods. Focus on lean protein sources like fish or chicken with vegetables and fruits. The American Heart Association is a Microbiologist!   American Heart Association Diet and Lifeystyle Recommendations   Exercise recommendations: The American Heart Association recommends 150 minutes of moderate intensity exercise weekly. Try 30 minutes of moderate intensity exercise 4-5 times per week. This could include walking, jogging, or swimming.   Important Information About Sugar

## 2021-11-26 NOTE — Progress Notes (Unsigned)
Office Visit    Patient Name: Peggy Armstrong Date of Encounter: 11/27/2021  PCP:  Margarito Courser, MD   [place]  Cardiologist:  Sanda Klein, MD  Advanced Practice Provider:  No care team member to display Electrophysiologist:  None      Chief Complaint    Peggy Armstrong is a 76 y.o. female with a hx of atrial flutter, possible transient atrial fibrillation, chronic colic heart failure, remote history of RLE DVT 2017, morbid obesity presents today for heart failure follow-up  Past Medical History    Past Medical History:  Diagnosis Date   DVT (deep venous thrombosis) (HCC)    right DVT   GERD (gastroesophageal reflux disease)    Gout    Gout    Hypertension    RA (rheumatoid arthritis) ([place])    Past Surgical History:  Procedure Laterality Date   ABDOMINAL HYSTERECTOMY     CHOLECYSTECTOMY     ENDOSCOPIC RETROGRADE CHOLANGIOPANCREATOGRAPHY (ERCP) WITH PROPOFOL N/A 06/15/2017   Procedure: ENDOSCOPIC RETROGRADE CHOLANGIOPANCREATOGRAPHY (ERCP) WITH PROPOFOL;  Surgeon: [name], MD;  Location: [facility];  Service: Endoscopy;  Laterality: N/A;   ESOPHAGOGASTRODUODENOSCOPY (EGD) WITH PROPOFOL N/A 03/24/2017   Procedure: ESOPHAGOGASTRODUODENOSCOPY (EGD) WITH PROPOFOL;  Surgeon: Doran Stabler, MD;  Location: [facility];  Service: Gastroenterology;  Laterality: N/A;   ESOPHAGOGASTRODUODENOSCOPY (EGD) WITH PROPOFOL N/A 11/07/2017   Procedure: ESOPHAGOGASTRODUODENOSCOPY (EGD) WITH PROPOFOL;  Surgeon: [name], MD;  Location: [facility];  Service: Endoscopy;  Laterality: N/A;   EUS N/A 04/06/2017   Procedure: UPPER ENDOSCOPIC ULTRASOUND (EUS) LINEAR;  Surgeon: [name], MD;  Location: [facility];  Service: Endoscopy;  Laterality: N/A;  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 female with a hx of  atrial flutter, possible transient atrial fibrillation, chronic colic heart failure, remote history of RLE DVT 2017, morbid obesity  last seen 10/28/2020 via virtual visit.  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  She required amiodarone for conversion.  EKG September 2018 consistent with atrial flutter.  September 2018 another EKG concerning for possible atrial fibrillation.  She has been on amiodarone and Eliquis was temporarily stopped February 2019 after endoscopy was complicated by bleeding but was able to be resumed May 2019.  She has followed with oncology since that time.  She was last seen 10/28/2020 via virtual visit doing well from a cardiac perspective and no change  Presents today for follow-up with her daughter.  She enjoys watching videos on the about crafting and is excited about her granddaughter come to visit for 1 month.  She continues to have tremor and episodes of shaking and is following with her primary care regarding this.  She reports no chest pain, pressure, tightness.  No shortness of breath at rest though does endorse dyspnea on exertion which is overall stable at her baseline and she related to inactivity.  No formal exercise routine but has been interested in purchasing pedals that she can use while in her home.  She does use a [employer] around  her home for improved stability.  No edema, orthopnea, PND.   EKGs/Labs/Other Studies Reviewed:   The following studies were reviewed today:   EKG:  EKG is  ordered today.  The ekg ordered today demonstrates SB 61 bpm with LAD and poor R wave progression.  Recent Labs: 11/26/2021: ALT 39  Recent Lipid Panel No results found for: CHOL, TRIG, HDL, CHOLHDL, VLDL, LDLCALC,  LDLDIRECT  Risk Assessment/Calculations:   CHA2DS2-VASc Score = 5   This indicates a 7.2% annual risk of stroke. The patient's score is based upon: CHF History: 1 HTN History: 1 Diabetes History: 0 Stroke History: 0 Vascular Disease History: 0 Age Score: 2 Gender Score: 1     Home Medications   Current Meds  Medication Sig   allopurinol (ZYLOPRIM) 300 MG tablet Take 300 mg by mouth daily.    apixaban (ELIQUIS) 5 MG TABS tablet Take 1 tablet (5 mg total) by mouth 2 (two) times daily. Restart on 11/11/2017   atorvastatin (LIPITOR) 20 MG tablet Take 1 tablet by mouth daily.   busPIRone (BUSPAR) 10 MG tablet Take 1 tablet by mouth 2 (two) times daily.   cholecalciferol (VITAMIN D) 1000 units tablet Take 1,000 Units by mouth daily.   famotidine (PEPCID) 20 MG tablet Take 1 tablet by mouth every evening.   levothyroxine (SYNTHROID) 75 MCG tablet Take 75 mcg by mouth daily before breakfast.   losartan (COZAAR) 25 MG tablet Take 1 tablet (25 mg total) by mouth daily.   pantoprazole (PROTONIX) 40 MG tablet Take 40 mg by mouth daily.   potassium chloride (K-DUR,KLOR-CON) 10 MEQ tablet Take 20 mEq by mouth daily.    sertraline (ZOLOFT) 25 MG tablet Take 25 mg by mouth daily.    tiZANidine (ZANAFLEX) 2 MG tablet Take by mouth. 1/2 tablet TID PRN   vitamin B-12 (CYANOCOBALAMIN) 1000 MCG tablet Take 1,000 mcg by mouth daily.   [DISCONTINUED] amiodarone (PACERONE) 200 MG tablet TAKE 1 TABLET BY MOUTH EVERY DAY   [DISCONTINUED] chlorthalidone (HYGROTON) 25 MG tablet Take 1 tablet (25 mg total) by mouth daily.   [DISCONTINUED] diltiazem (CARDIZEM CD) 120 MG 24 hr capsule TAKE 1 CAPSULE BY MOUTH EVERY DAY     Review of Systems      All other systems reviewed and are otherwise negative except as noted above.  Physical Exam    VS:  BP (!) 142/88 (BP Location: Right Arm, Patient Position: Sitting, Cuff Size: Normal)   Pulse 61   Ht '5\' 1"'$  (1.549 m)   Wt 230 lb 6.4 oz (104.5 kg)   BMI 43.53  kg/m  , BMI Body mass index is 43.53 kg/m.  Wt Readings from Last 3 Encounters:  11/26/21 230 lb 6.4 oz (104.5 kg)  10/28/20 225 lb (102.1 kg)  07/26/18 260 lb 3.2 oz (118 kg)     GEN: Well nourished,overweight,  well developed, in no acute distress. HEENT: normal. Neck: Supple, no JVD, carotid bruits, or masses. Cardiac: RRR, no murmurs, rubs, or gallops. No clubbing, cyanosis, edema. Radials/PT 2+ and equal bilaterally.  Respiratory:  Respirations regular and unlabored, clear to auscultation bilaterally. GI: Soft, nontender, nondistended. MS: No deformity or atrophy. Skin: Warm and dry, no rash. Neuro:  Strength and sensation are intact. Psych: Normal affect.  Assessment & Plan    Atrial flutter/atrial fibrillation/on amiodarone therapy/hypercoagulable state -denies palpitations.  EKG today sinus bradycardia.  Continue diltiazem 120 mg daily, amiodarone 200 mg daily, Eliquis 5 mg twice daily.  Does not meet dose  reduction criteria for Eliquis. CHA2DS2-VASc Score = 5 [CHF History: 1, HTN History: 1, Diabetes History: 0, Stroke History: 0, Vascular Disease History: 0, Age Score: 2, Gender Score: 1].  Therefore, the patient's annual risk of stroke is 7.2 %.    LFT today for monitoring on amiodarone. TSH and levothyroxine managed by PCP.   Chronic diastolic heart failure -no recent heart failure symptoms.  Volume status difficult to ascertain due to body habitus.  Not currently requiring diuretic. Low sodium diet, fluid restriction <2L, and daily weights encouraged. Educated to contact our office for weight gain of 2 lbs overnight or 5 lbs in one week.   HTN -elevated in clinic though endorses she is nervous today.  Better controlled at home.  She will monitor at home for the next 3 weeks consistently and if the 130/80 she will contact us if needed in the future her losartan dose could be increased.  History of DVT -no recurrence.  On Eliquis as detailed above.  Obesity - Weight loss via  diet and exercise encouraged. Discussed the impact being overweight would have on cardiovascular risk. No formal exercise routine. Encouraged to purchase pedals that she was interested in so she can exercise both arms and legs. [facility] program too far of a commute. She will consider [place] therapy and contact us if she decides to participate. PCP had previously tried to order but could not locate agency with availability.     Disposition: Follow up in 1 year(s) with Sanda Klein, MD or APP.  Signed, Loel Dubonnet, NP 11/27/2021, 3:11 PM [place]

## 2021-11-27 ENCOUNTER — Encounter (HOSPITAL_BASED_OUTPATIENT_CLINIC_OR_DEPARTMENT_OTHER): Payer: Self-pay | Admitting: Family

## 2021-11-27 LAB — HEPATIC FUNCTION PANEL
ALT: 39 IU/L — ABNORMAL HIGH (ref 0–32)
AST: 41 IU/L — ABNORMAL HIGH (ref 0–40)
Albumin: 4 g/dL (ref 3.7–4.7)
Alkaline Phosphatase: 112 IU/L (ref 44–121)
Bilirubin Total: 0.6 mg/dL (ref 0.0–1.2)
Bilirubin, Direct: 0.18 mg/dL (ref 0.00–0.40)
Total Protein: 6.7 g/dL (ref 6.0–8.5)

## 2021-11-29 ENCOUNTER — Telehealth (HOSPITAL_BASED_OUTPATIENT_CLINIC_OR_DEPARTMENT_OTHER): Payer: Self-pay

## 2021-11-29 DIAGNOSIS — I10 Essential (primary) hypertension: Secondary | ICD-10-CM

## 2021-11-29 NOTE — Telephone Encounter (Addendum)
Results called to patient who verbalizes understanding!   ----- Message from Loel Dubonnet, NP sent at 11/29/2021  8:19 AM EDT ----- Liver enzymes slightly elevated. Ensure avoiding alcohol, acetaminophen (tylenol), fried foods. Continue current medications. Repeat liver enzymes in 4-6 weeks for monitoring.

## 2022-11-07 ENCOUNTER — Other Ambulatory Visit (HOSPITAL_BASED_OUTPATIENT_CLINIC_OR_DEPARTMENT_OTHER): Payer: Self-pay | Admitting: Family

## 2022-11-07 NOTE — Telephone Encounter (Signed)
Patient of Dr. Croitoru. Please review for refill. Thank you!  

## 2022-12-02 DIAGNOSIS — G2581 Restless legs syndrome: Secondary | ICD-10-CM | POA: Insufficient documentation

## 2022-12-02 HISTORY — DX: Restless legs syndrome: G25.81

## 2022-12-14 ENCOUNTER — Other Ambulatory Visit (HOSPITAL_BASED_OUTPATIENT_CLINIC_OR_DEPARTMENT_OTHER): Payer: Self-pay | Admitting: Family

## 2022-12-15 NOTE — Telephone Encounter (Signed)
Patient of Dr. Croitoru. Please review for refill. Thank you!  

## 2022-12-19 ENCOUNTER — Telehealth: Payer: Self-pay | Admitting: Cardiovascular Disease

## 2022-12-19 DIAGNOSIS — I480 Paroxysmal atrial fibrillation: Secondary | ICD-10-CM

## 2022-12-19 MED ORDER — APIXABAN 5 MG PO TABS: 5.0000 mg | ORAL_TABLET | Freq: Two times a day (BID) | ORAL | 0 refills | Status: DC

## 2022-12-19 MED ORDER — AMIODARONE HCL 200 MG PO TABS: 200.0000 mg | ORAL_TABLET | Freq: Every day | ORAL | 0 refills | Status: DC

## 2022-12-19 MED ORDER — LOSARTAN POTASSIUM 25 MG PO TABS
25.0000 mg | ORAL_TABLET | Freq: Every day | ORAL | 0 refills | Status: DC
Start: 2022-12-19 — End: 2023-05-18

## 2022-12-19 MED ORDER — DILTIAZEM HCL ER COATED BEADS 120 MG PO CP24: 120.0000 mg | ORAL_CAPSULE | Freq: Every day | ORAL | 0 refills | Status: DC

## 2022-12-19 MED ORDER — CHLORTHALIDONE 25 MG PO TABS
25.0000 mg | ORAL_TABLET | Freq: Every day | ORAL | 0 refills | Status: DC
Start: 2022-12-19 — End: 2023-02-15

## 2022-12-19 MED ORDER — ATORVASTATIN CALCIUM 20 MG PO TABS
20.0000 mg | ORAL_TABLET | Freq: Every day | ORAL | 1 refills | Status: DC
  Filled 2024-04-06 – 2024-04-26 (×2): qty 60, 60d supply, fill #0

## 2022-12-19 NOTE — Telephone Encounter (Signed)
Last seen 11/2021 recommended to follow up with Dr. Alanson Puls or APP in 1 year.   Unclear why she is calling.   Alver Sorrow, NP

## 2022-12-19 NOTE — Telephone Encounter (Signed)
Calling to speak to the nurse about what's been going on with her and drs appt. Please advise

## 2022-12-19 NOTE — Telephone Encounter (Signed)
Returned call to patient,   Patient states she saw Gillian Shields, NP last year and believes she is overdue for her follow up. She states she is unable to schedule her follow up at this time due to some other health conditions. Her thyroid has been very elevated, ,her hemoglobin and iron have been very low. She saw a hematologist Wednesday. She will call us when she is able to schedule again. Per Gillian Shields, NP okay to give refills.

## 2023-02-14 ENCOUNTER — Other Ambulatory Visit (HOSPITAL_BASED_OUTPATIENT_CLINIC_OR_DEPARTMENT_OTHER): Payer: Self-pay | Admitting: Family

## 2023-05-11 ENCOUNTER — Emergency Department (HOSPITAL_COMMUNITY): Payer: 59

## 2023-05-11 ENCOUNTER — Other Ambulatory Visit: Payer: Self-pay

## 2023-05-11 ENCOUNTER — Inpatient Hospital Stay (HOSPITAL_COMMUNITY)
Admission: EM | Admit: 2023-05-11 | Discharge: 2023-05-18 | DRG: 543 | Disposition: A | Discharge status: Skilled Nursing Facility | Payer: 59 | Attending: Internal Medicine | Admitting: Internal Medicine

## 2023-05-11 ENCOUNTER — Encounter (HOSPITAL_COMMUNITY): Payer: Self-pay

## 2023-05-11 DIAGNOSIS — D539 Nutritional anemia, unspecified: Secondary | ICD-10-CM

## 2023-05-11 DIAGNOSIS — R6884 Jaw pain: Secondary | ICD-10-CM | POA: Diagnosis present

## 2023-05-11 DIAGNOSIS — E039 Hypothyroidism, unspecified: Secondary | ICD-10-CM | POA: Diagnosis present

## 2023-05-11 DIAGNOSIS — Z6841 Body Mass Index (BMI) 40.0 and over, adult: Secondary | ICD-10-CM

## 2023-05-11 DIAGNOSIS — D508 Other iron deficiency anemias: Secondary | ICD-10-CM | POA: Diagnosis present

## 2023-05-11 DIAGNOSIS — K449 Diaphragmatic hernia without obstruction or gangrene: Secondary | ICD-10-CM | POA: Diagnosis present

## 2023-05-11 DIAGNOSIS — M5127 Other intervertebral disc displacement, lumbosacral region: Secondary | ICD-10-CM | POA: Diagnosis present

## 2023-05-11 DIAGNOSIS — R195 Other fecal abnormalities: Secondary | ICD-10-CM | POA: Diagnosis not present

## 2023-05-11 DIAGNOSIS — N3000 Acute cystitis without hematuria: Secondary | ICD-10-CM | POA: Diagnosis present

## 2023-05-11 DIAGNOSIS — I714 Abdominal aortic aneurysm, without rupture, unspecified: Secondary | ICD-10-CM | POA: Diagnosis present

## 2023-05-11 DIAGNOSIS — F39 Unspecified mood [affective] disorder: Secondary | ICD-10-CM | POA: Diagnosis present

## 2023-05-11 DIAGNOSIS — D696 Thrombocytopenia, unspecified: Secondary | ICD-10-CM | POA: Diagnosis present

## 2023-05-11 DIAGNOSIS — D7589 Other specified diseases of blood and blood-forming organs: Secondary | ICD-10-CM | POA: Diagnosis present

## 2023-05-11 DIAGNOSIS — I10 Essential (primary) hypertension: Secondary | ICD-10-CM | POA: Diagnosis not present

## 2023-05-11 DIAGNOSIS — K5900 Constipation, unspecified: Secondary | ICD-10-CM | POA: Diagnosis not present

## 2023-05-11 DIAGNOSIS — Z8261 Family history of arthritis: Secondary | ICD-10-CM

## 2023-05-11 DIAGNOSIS — Z90710 Acquired absence of both cervix and uterus: Secondary | ICD-10-CM

## 2023-05-11 DIAGNOSIS — Z79899 Other long term (current) drug therapy: Secondary | ICD-10-CM

## 2023-05-11 DIAGNOSIS — E785 Hyperlipidemia, unspecified: Secondary | ICD-10-CM | POA: Diagnosis present

## 2023-05-11 DIAGNOSIS — I7143 Infrarenal abdominal aortic aneurysm, without rupture: Secondary | ICD-10-CM | POA: Diagnosis not present

## 2023-05-11 DIAGNOSIS — Z833 Family history of diabetes mellitus: Secondary | ICD-10-CM

## 2023-05-11 DIAGNOSIS — I5033 Acute on chronic diastolic (congestive) heart failure: Secondary | ICD-10-CM | POA: Diagnosis present

## 2023-05-11 DIAGNOSIS — I5032 Chronic diastolic (congestive) heart failure: Secondary | ICD-10-CM

## 2023-05-11 DIAGNOSIS — R5383 Other fatigue: Secondary | ICD-10-CM | POA: Diagnosis present

## 2023-05-11 DIAGNOSIS — M4856XA Collapsed vertebra, not elsewhere classified, lumbar region, initial encounter for fracture: Secondary | ICD-10-CM | POA: Diagnosis present

## 2023-05-11 DIAGNOSIS — D131 Benign neoplasm of stomach: Secondary | ICD-10-CM | POA: Diagnosis present

## 2023-05-11 DIAGNOSIS — D631 Anemia in chronic kidney disease: Secondary | ICD-10-CM | POA: Diagnosis present

## 2023-05-11 DIAGNOSIS — D509 Iron deficiency anemia, unspecified: Secondary | ICD-10-CM | POA: Diagnosis not present

## 2023-05-11 DIAGNOSIS — B9620 Unspecified Escherichia coli [E. coli] as the cause of diseases classified elsewhere: Secondary | ICD-10-CM | POA: Diagnosis present

## 2023-05-11 DIAGNOSIS — Z9049 Acquired absence of other specified parts of digestive tract: Secondary | ICD-10-CM

## 2023-05-11 DIAGNOSIS — K2100 Gastro-esophageal reflux disease with esophagitis, without bleeding: Secondary | ICD-10-CM | POA: Diagnosis present

## 2023-05-11 DIAGNOSIS — E860 Dehydration: Secondary | ICD-10-CM | POA: Diagnosis present

## 2023-05-11 DIAGNOSIS — D500 Iron deficiency anemia secondary to blood loss (chronic): Secondary | ICD-10-CM | POA: Diagnosis not present

## 2023-05-11 DIAGNOSIS — C170 Malignant neoplasm of duodenum: Secondary | ICD-10-CM | POA: Diagnosis present

## 2023-05-11 DIAGNOSIS — N1831 Chronic kidney disease, stage 3a: Secondary | ICD-10-CM | POA: Diagnosis present

## 2023-05-11 DIAGNOSIS — Z79890 Hormone replacement therapy (postmenopausal): Secondary | ICD-10-CM

## 2023-05-11 DIAGNOSIS — M069 Rheumatoid arthritis, unspecified: Secondary | ICD-10-CM | POA: Diagnosis present

## 2023-05-11 DIAGNOSIS — R531 Weakness: Secondary | ICD-10-CM

## 2023-05-11 DIAGNOSIS — Z7901 Long term (current) use of anticoagulants: Secondary | ICD-10-CM

## 2023-05-11 DIAGNOSIS — E782 Mixed hyperlipidemia: Secondary | ICD-10-CM

## 2023-05-11 DIAGNOSIS — N19 Unspecified kidney failure: Secondary | ICD-10-CM | POA: Diagnosis not present

## 2023-05-11 DIAGNOSIS — E66813 Obesity, class 3: Secondary | ICD-10-CM | POA: Diagnosis present

## 2023-05-11 DIAGNOSIS — K760 Fatty (change of) liver, not elsewhere classified: Secondary | ICD-10-CM | POA: Diagnosis present

## 2023-05-11 DIAGNOSIS — Z800 Family history of malignant neoplasm of digestive organs: Secondary | ICD-10-CM

## 2023-05-11 DIAGNOSIS — I7141 Pararenal abdominal aortic aneurysm, without rupture: Secondary | ICD-10-CM | POA: Diagnosis present

## 2023-05-11 DIAGNOSIS — D62 Acute posthemorrhagic anemia: Secondary | ICD-10-CM | POA: Diagnosis not present

## 2023-05-11 DIAGNOSIS — R262 Difficulty in walking, not elsewhere classified: Secondary | ICD-10-CM | POA: Diagnosis present

## 2023-05-11 DIAGNOSIS — I4892 Unspecified atrial flutter: Secondary | ICD-10-CM | POA: Diagnosis present

## 2023-05-11 DIAGNOSIS — E876 Hypokalemia: Secondary | ICD-10-CM | POA: Diagnosis present

## 2023-05-11 DIAGNOSIS — F411 Generalized anxiety disorder: Secondary | ICD-10-CM

## 2023-05-11 DIAGNOSIS — M8448XA Pathological fracture, other site, initial encounter for fracture: Secondary | ICD-10-CM | POA: Diagnosis present

## 2023-05-11 DIAGNOSIS — J0110 Acute frontal sinusitis, unspecified: Secondary | ICD-10-CM | POA: Diagnosis present

## 2023-05-11 DIAGNOSIS — I130 Hypertensive heart and chronic kidney disease with heart failure and stage 1 through stage 4 chronic kidney disease, or unspecified chronic kidney disease: Secondary | ICD-10-CM | POA: Diagnosis present

## 2023-05-11 DIAGNOSIS — Z8042 Family history of malignant neoplasm of prostate: Secondary | ICD-10-CM

## 2023-05-11 DIAGNOSIS — S32020A Wedge compression fracture of second lumbar vertebra, initial encounter for closed fracture: Principal | ICD-10-CM

## 2023-05-11 DIAGNOSIS — Z8249 Family history of ischemic heart disease and other diseases of the circulatory system: Secondary | ICD-10-CM

## 2023-05-11 DIAGNOSIS — K3189 Other diseases of stomach and duodenum: Secondary | ICD-10-CM | POA: Diagnosis not present

## 2023-05-11 DIAGNOSIS — Z86718 Personal history of other venous thrombosis and embolism: Secondary | ICD-10-CM

## 2023-05-11 DIAGNOSIS — M25552 Pain in left hip: Secondary | ICD-10-CM | POA: Diagnosis present

## 2023-05-11 DIAGNOSIS — I480 Paroxysmal atrial fibrillation: Secondary | ICD-10-CM | POA: Diagnosis present

## 2023-05-11 DIAGNOSIS — K317 Polyp of stomach and duodenum: Secondary | ICD-10-CM | POA: Diagnosis not present

## 2023-05-11 HISTORY — DX: Hypothyroidism, unspecified: E03.9

## 2023-05-11 HISTORY — DX: Generalized anxiety disorder: F41.1

## 2023-05-11 HISTORY — DX: Unspecified atrial fibrillation: I48.91

## 2023-05-11 HISTORY — DX: Wedge compression fracture of second lumbar vertebra, initial encounter for closed fracture: S32.020A

## 2023-05-11 LAB — CBC
HCT: 30.3 % — ABNORMAL LOW (ref 36.0–46.0)
Hemoglobin: 8.8 g/dL — ABNORMAL LOW (ref 12.0–15.0)
MCH: 30.9 pg (ref 26.0–34.0)
MCHC: 29 g/dL — ABNORMAL LOW (ref 30.0–36.0)
MCV: 106.3 fL — ABNORMAL HIGH (ref 80.0–100.0)
Platelets: 99 10*3/uL — ABNORMAL LOW (ref 150–400)
RBC: 2.85 MIL/uL — ABNORMAL LOW (ref 3.87–5.11)
RDW: 16.4 % — ABNORMAL HIGH (ref 11.5–15.5)
WBC: 8.3 10*3/uL (ref 4.0–10.5)
nRBC: 0 % (ref 0.0–0.2)

## 2023-05-11 LAB — BASIC METABOLIC PANEL
Anion gap: 10 (ref 5–15)
BUN: 32 mg/dL — ABNORMAL HIGH (ref 8–23)
CO2: 26 mmol/L (ref 22–32)
Calcium: 8.8 mg/dL — ABNORMAL LOW (ref 8.9–10.3)
Chloride: 101 mmol/L (ref 98–111)
Creatinine, Ser: 1.01 mg/dL — ABNORMAL HIGH (ref 0.44–1.00)
GFR, Estimated: 57 mL/min — ABNORMAL LOW (ref >60)
Glucose, Bld: 106 mg/dL — ABNORMAL HIGH (ref 70–99)
Potassium: 3 mmol/L — ABNORMAL LOW (ref 3.5–5.1)
Sodium: 137 mmol/L (ref 135–145)

## 2023-05-11 LAB — URINALYSIS, ROUTINE W REFLEX MICROSCOPIC
Bilirubin Urine: NEGATIVE
Glucose, UA: NEGATIVE mg/dL
Ketones, ur: NEGATIVE mg/dL
Nitrite: POSITIVE — AB
Protein, ur: NEGATIVE mg/dL
Specific Gravity, Urine: 1.012 (ref 1.005–1.030)
pH: 7 (ref 5.0–8.0)

## 2023-05-11 LAB — MAGNESIUM: Magnesium: 1.7 mg/dL (ref 1.7–2.4)

## 2023-05-11 MED ORDER — ONDANSETRON 4 MG PO TBDP
4.0000 mg | ORAL_TABLET | Freq: Once | ORAL | Status: AC
  Administered 2023-05-11: 4 mg via ORAL
  Filled 2023-05-11: qty 1

## 2023-05-11 MED ORDER — OXYCODONE-ACETAMINOPHEN 5-325 MG PO TABS
1.0000 | ORAL_TABLET | Freq: Once | ORAL | Status: AC
  Administered 2023-05-11: 1 via ORAL
  Filled 2023-05-11: qty 1

## 2023-05-11 MED ORDER — POTASSIUM CHLORIDE CRYS ER 20 MEQ PO TBCR
40.0000 meq | EXTENDED_RELEASE_TABLET | Freq: Once | ORAL | Status: AC
  Administered 2023-05-11: 40 meq via ORAL
  Filled 2023-05-11: qty 2

## 2023-05-11 MED ORDER — NALOXONE HCL 0.4 MG/ML IJ SOLN: 0.4000 mg | INTRAMUSCULAR | Status: DC | PRN

## 2023-05-11 MED ORDER — ACETAMINOPHEN 650 MG RE SUPP: 650.0000 mg | Freq: Four times a day (QID) | RECTAL | Status: DC | PRN

## 2023-05-11 MED ORDER — SODIUM CHLORIDE 0.9 % IV SOLN
1.0000 g | Freq: Once | INTRAVENOUS | Status: AC
  Administered 2023-05-11: 1 g via INTRAVENOUS
  Filled 2023-05-11: qty 10

## 2023-05-11 MED ORDER — MORPHINE SULFATE (PF) 2 MG/ML IV SOLN
2.0000 mg | Freq: Once | INTRAVENOUS | Status: AC
  Administered 2023-05-11: 2 mg via INTRAVENOUS
  Filled 2023-05-11: qty 1

## 2023-05-11 MED ORDER — ACETAMINOPHEN 325 MG PO TABS
650.0000 mg | ORAL_TABLET | Freq: Once | ORAL | Status: AC
  Administered 2023-05-11: 650 mg via ORAL
  Filled 2023-05-11: qty 2

## 2023-05-11 MED ORDER — MELATONIN 3 MG PO TABS: 3.0000 mg | ORAL_TABLET | Freq: Every evening | ORAL | Status: DC | PRN

## 2023-05-11 MED ORDER — OXYCODONE-ACETAMINOPHEN 5-325 MG PO TABS
1.0000 | ORAL_TABLET | ORAL | Status: DC | PRN
  Administered 2023-05-12: 1 via ORAL
  Filled 2023-05-11: qty 1

## 2023-05-11 MED ORDER — MORPHINE SULFATE (PF) 4 MG/ML IV SOLN
3.0000 mg | INTRAVENOUS | Status: DC | PRN
  Administered 2023-05-12: 3 mg via INTRAVENOUS
  Filled 2023-05-11: qty 1

## 2023-05-11 MED ORDER — LIDOCAINE 5 % EX PTCH
1.0000 | MEDICATED_PATCH | Freq: Once | CUTANEOUS | Status: AC
  Administered 2023-05-11: 1 via TRANSDERMAL
  Filled 2023-05-11: qty 1

## 2023-05-11 MED ORDER — ACETAMINOPHEN 325 MG PO TABS
650.0000 mg | ORAL_TABLET | Freq: Four times a day (QID) | ORAL | Status: DC | PRN
Start: 2023-05-11 — End: 2023-05-12

## 2023-05-11 MED ORDER — ONDANSETRON HCL 4 MG/2ML IJ SOLN: 4.0000 mg | Freq: Four times a day (QID) | INTRAMUSCULAR | Status: DC | PRN

## 2023-05-11 NOTE — H&P (Addendum)
History and Physical      Peggy Armstrong WJX:[id number] DOB: April 28, 1946 DOA: 05/11/2023; DOS: 05/11/2023  PCP: Wilfred Curtis, MD  Patient coming from: home   I have personally briefly reviewed patient's old medical records in [facility]  Chief Complaint: Low back pain  HPI: Peggy Armstrong is a 77 y.o. female with medical history significant for essential pretension, paroxysmal atrial fibrillation chronically anticoagulated on Eliquis, hyperlipidemia, chronic diastolic heart failure, who is admitted to [facility] on 05/11/2023 with L2 burst fracture after presenting from home to [facility] ED complaining of low back pain.   The patient reports 4 to 5 days of new low back discomfort, which she describes as sharp, with radiation into the left lower extremity terminating at the level of the left knee, in the absence of any recent preceding trauma.  Denies any associated acute focal weakness or any acute focal numbness/paresthesias, including no recent saddle anesthesia.  She denies any recent fecal incontinence or urinary retention.  While she denies any associated acute focal weakness, she notes difficulty with ambulating over the last 4 to 5 days due to associated poor pain control relating to her low back discomfort, and, as a consequence, notes requirement for increased assistance with ADL completion over that timeframe.  In addition to her poor pain control over the last 4 to 5 days, she also notes generalized weakness over that timeframe.  Given difficulty with ambulation over the last few days, she reports relative decline in oral intake of both food and water over that timeframe.  Not associate with any recent nausea, vomiting, loose stool.  Denies any associated subjective fever, chills, rigors, or generalized myalgias.  Per chart review, prior hemoglobin data points notable for the following: Hemoglobin in January 2020 was 12.8, 12.8 in October 2022, 8.5 on 12/02/2022, and most  recently 8.4 on 04/14/2023.  Denies any recent melena or hematochezia.  Is chronically anticoagulated on Eliquis in the setting of a history of paroxysmal atrial fibrillation.     ED Course:  Vital signs in the ED were notable for the following: Afebrile; heart rates in the 70s to 80s; systolic blood pressures in the 140s to 150s; respiratory rate 16-18, oxygen saturation 94 to 100% on room air.  Labs were notable for the following: BMP notable for the following: Sodium 137, potassium 3.0, creatinine 1.01, BUN/creatinine ratio greater than 20, glucose 106.  Magnesium level 1.7.  CBC notable for white blood cell count 8300, hemoglobin 8.8 associated with macrocytic finding, platelet count 99.  Urinalysis associated with cloudy appearance and notable for 21-50 white blood cells, many bacteria, moderate leukocyte esterase, positive nitrates and no evidence of squamous epithelial cells.  Per my interpretation, EKG in ED demonstrated the following: No EKG performed today.  Imaging in the ED, per corresponding formal radiology read, was notable for the following: Plain film of the left knee showed no evidence of acute bony abnormality.  Plain film of the pelvis showed no evidence of acute bony abnormality.  CT lumbar spine without contrast showed an age-indeterminate burst fracture of L2 vertebral body with up to 99% vertebral body height loss and 6 mm retropulsion.  CT of the left femur showed no evidence of acute traumatic injury.  EDP discussed case/imaging with on-call [place] neurosurgery (Dr. Conchita Paris was on-call), who recommend [facility] admission for conservative measures, including TLSO brace, PT, OT, pain control, followed by outpatient follow-up in neurosurgery clinic.    While in the ED, the following were  administered: Acetaminophen 650 mg p.o. x 1 dose, Lidoderm patch, morphine 2 mg IV x 1, Percocet 5/325 mg p.o. x 1 dose, Zofran 4 mg ODT, potassium chloride 40 mill colons p.o. x 1,  Rocephin.  Subsequently, the patient was admitted for further evaluation management of L2 burst fracture, including initiation conservative measures, with presenting labs notable for hypokalemia, urinary tract infection, along with clinical evidence of dehydration and presenting generalized weakness as well as subacute anemia.    Review of Systems: As per HPI otherwise 10 point review of systems negative.   Past Medical History:  Diagnosis Date   DVT (deep venous thrombosis) (HCC)    right DVT   GERD (gastroesophageal reflux disease)    Gout    Gout    Hypertension    RA (rheumatoid arthritis) (HCC)     Past Surgical History:  Procedure Laterality Date   ABDOMINAL HYSTERECTOMY     CHOLECYSTECTOMY     ENDOSCOPIC RETROGRADE CHOLANGIOPANCREATOGRAPHY (ERCP) WITH PROPOFOL N/A 06/15/2017   Procedure: ENDOSCOPIC RETROGRADE CHOLANGIOPANCREATOGRAPHY (ERCP) WITH PROPOFOL;  Surgeon: Rachael Fee, MD;  Location: [facility] ENDOSCOPY;  Service: Endoscopy;  Laterality: N/A;   ESOPHAGOGASTRODUODENOSCOPY (EGD) WITH PROPOFOL N/A 03/24/2017   Procedure: ESOPHAGOGASTRODUODENOSCOPY (EGD) WITH PROPOFOL;  Surgeon: Sherrilyn Rist, MD;  Location: [facility] ENDOSCOPY;  Service: Gastroenterology;  Laterality: N/A;   ESOPHAGOGASTRODUODENOSCOPY (EGD) WITH PROPOFOL N/A 11/07/2017   Procedure: ESOPHAGOGASTRODUODENOSCOPY (EGD) WITH PROPOFOL;  Surgeon: Rachael Fee, MD;  Location: [facility] ENDOSCOPY;  Service: Endoscopy;  Laterality: N/A;   EUS N/A 04/06/2017   Procedure: UPPER ENDOSCOPIC ULTRASOUND (EUS) LINEAR;  Surgeon: Rachael Fee, MD;  Location: [facility] ENDOSCOPY;  Service: Endoscopy;  Laterality: N/A;  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 She has never used smokeless tobacco. She reports that she does not drink alcohol and does not use  drugs.   Allergies  Allergen Reactions   Codeine Nausea And Vomiting    Made "very sick"    Family History  Problem Relation Age of Onset   Prostate cancer Father    Colon cancer Father    Hypertension Mother    Rheum arthritis Maternal Grandmother    Diabetes Paternal Grandmother    Heart disease Paternal Grandfather     Family history reviewed and not pertinent    Prior to Admission medications   Medication Sig Start Date End Date Taking? Authorizing Provider  acetaminophen (TYLENOL) 325 MG tablet Take 650 mg by mouth every 6 (six) hours as needed for mild pain (pain score 1-3) or moderate pain (pain score 4-6).   Yes [provider]  allopurinol (ZYLOPRIM) 300 MG tablet Take 300 mg by mouth daily.  05/06/15  Yes [provider]  amiodarone (PACERONE) 200 MG tablet Take 1 tablet (200 mg total) by mouth daily. NEED APPOINTMENT 02/15/23  Yes Croitoru, Mihai, MD  apixaban (ELIQUIS) 5 MG TABS tablet Take 1 tablet (5 mg total) by mouth 2 (two) times daily. Restart on 11/11/2017 12/19/22  Yes Alver Sorrow, NP  atorvastatin (LIPITOR) 20 MG tablet Take 1 tablet (20 mg total) by mouth daily. 12/19/22  Yes Alver Sorrow, NP  busPIRone (BUSPAR) 10 MG tablet Take 1 tablet by mouth 2 (two) times daily. 07/18/18  Yes [provider]  chlorthalidone (HYGROTON)  25 MG tablet Take 1 tablet (25 mg total) by mouth daily. NEED APPOINTMENT 02/15/23 02/10/24 Yes Croitoru, Mihai, MD  cholecalciferol (VITAMIN D) 1000 units tablet Take 1,000 Units by mouth daily.   Yes [provider]  diltiazem (CARDIZEM CD) 120 MG 24 hr capsule Take 1 capsule (120 mg total) by mouth daily. 12/19/22  Yes Alver Sorrow, NP  famotidine (PEPCID) 20 MG tablet Take 1 tablet by mouth every evening. 07/10/18  Yes [provider]  ferrous sulfate 325 (65 FE) MG tablet Take 325 mg by mouth daily with breakfast.   Yes [provider]  KLOR-CON M20 20 MEQ tablet Take 20 mEq  by mouth daily.   Yes [provider]  levothyroxine (SYNTHROID) 75 MCG tablet Take 75 mcg by mouth daily before breakfast.   Yes [provider]  losartan (COZAAR) 25 MG tablet Take 1 tablet (25 mg total) by mouth daily. 12/19/22  Yes Alver Sorrow, NP  pantoprazole (PROTONIX) 40 MG tablet Take 40 mg by mouth daily.   Yes [provider]  sertraline (ZOLOFT) 25 MG tablet Take 25 mg by mouth daily.  07/07/17  Yes [provider]  tiZANidine (ZANAFLEX) 2 MG tablet Take by mouth. 1/2 tablet TID PRN 10/01/20  Yes [provider]  vitamin B-12 (CYANOCOBALAMIN) 1000 MCG tablet Take 1,000 mcg by mouth daily.   Yes [provider]  furosemide (LASIX) 20 MG tablet Take 20 mg by mouth daily. Patient not taking: Reported on 05/11/2023 05/03/23   [provider]     Objective    Physical Exam: Vitals:   05/11/23 1652 05/11/23 2101 05/11/23 2140 05/11/23 2155  BP:  (!) 149/99    Pulse:  82 74   Resp:  16 16   Temp:    97.9 F (36.6 C)  TempSrc:    Oral  SpO2:  99% 94%   Weight: 104 kg       General: appears to be stated age; alert, oriented Skin: warm, dry, no rash Head:  AT/[place] Mouth:  Oral mucosa membranes appear dry, normal dentition Neck: supple; trachea midline Heart:  RRR; did not appreciate any M/R/G Lungs: CTAB, did not appreciate any wheezes, rales, or rhonchi Abdomen: + BS; soft, ND, NT Vascular: 2+ pedal pulses b/l; 2+ radial pulses b/l Extremities: no peripheral edema, no muscle wasting Neuro: strength and sensation intact in upper and lower extremities b/l   Labs on Admission: I have personally reviewed following labs and imaging studies  CBC: Recent Labs  Lab 05/11/23 1944  WBC 8.3  HGB 8.8*  HCT 30.3*  MCV 106.3*  PLT 99*   Basic Metabolic Panel: Recent Labs  Lab 05/11/23 1944  NA 137  K 3.0*  CL 101  CO2 26  GLUCOSE 106*  BUN 32*  CREATININE 1.01*  CALCIUM 8.8*   GFR: CrCl cannot be  calculated (Unknown ideal weight.). Liver Function Tests: No results for input(s): "AST", "ALT", "ALKPHOS", "BILITOT", "PROT", "ALBUMIN" in the last 168 hours. No results for input(s): "LIPASE", "AMYLASE" in the last 168 hours. No results for input(s): "AMMONIA" in the last 168 hours. Coagulation Profile: No results for input(s): "INR", "PROTIME" in the last 168 hours. Cardiac Enzymes: No results for input(s): "CKTOTAL", "CKMB", "CKMBINDEX", "TROPONINI" in the last 168 hours. BNP (last 3 results) No results for input(s): "PROBNP" in the last 8760 hours. HbA1C: No results for input(s): "HGBA1C" in the last 72 hours. CBG: No results for input(s): "GLUCAP" in the last  168 hours. Lipid Profile: No results for input(s): "CHOL", "HDL", "LDLCALC", "TRIG", "CHOLHDL", "LDLDIRECT" in the last 72 hours. Thyroid Function Tests: No results for input(s): "TSH", "T4TOTAL", "FREET4", "T3FREE", "THYROIDAB" in the last 72 hours. Anemia Panel: No results for input(s): "VITAMINB12", "FOLATE", "FERRITIN", "TIBC", "IRON", "RETICCTPCT" in the last 72 hours. Urine analysis:    Component Value Date/Time   COLORURINE YELLOW 05/11/2023 1721   APPEARANCEUR CLOUDY (A) 05/11/2023 1721   LABSPEC 1.012 05/11/2023 1721   PHURINE 7.0 05/11/2023 1721   GLUCOSEU NEGATIVE 05/11/2023 1721   HGBUR LARGE (A) 05/11/2023 1721   BILIRUBINUR NEGATIVE 05/11/2023 1721   KETONESUR NEGATIVE 05/11/2023 1721   PROTEINUR NEGATIVE 05/11/2023 1721   NITRITE POSITIVE (A) 05/11/2023 1721   LEUKOCYTESUR MODERATE (A) 05/11/2023 1721    Radiological Exams on Admission: CT FEMUR LEFT WO CONTRAST  Result Date: 05/11/2023 CLINICAL DATA:  Fracture, femur C/o left hip pain x5 days. Denies fall/injury. Left pedal pulses noted. EXAM: CT OF THE LOWER LEFT EXTREMITY WITHOUT CONTRAST TECHNIQUE: Multidetector CT imaging of the lower left extremity was performed according to the standard protocol. RADIATION DOSE REDUCTION: This exam was  performed according to the departmental dose-optimization program which includes automated exposure control, adjustment of the mA and/or kV according to patient size and/or use of iterative reconstruction technique. COMPARISON:  X-ray pelvis and left knee 05/11/2023 FINDINGS: Bones/Joint/Cartilage No evidence of fracture, dislocation, or joint effusion. No evidence of severe arthropathy. No aggressive appearing focal bone abnormality. Ligaments Suboptimally assessed by CT. Muscles and Tendons Grossly unremarkable. Soft tissues Mild left hip/gluteal subcutaneus soft tissue edema with associated fat density lesion within the soft tissues measuring 3.6 x 2.4 cm. Associated central calcification and heterogeneity of this fat density within the left gluteal soft tissues. Otherwise atherosclerotic plaque. IMPRESSION: 1. A 3.3 cm lipomatous lesion that demonstrates calcification and slight complexity. Recommend attention on follow-up. 2.  Negative for acute traumatic injury. Electronically Signed   By: Tish Frederickson M.D.   On: 05/11/2023 20:47   CT Lumbar Spine Wo Contrast  Result Date: 05/11/2023 CLINICAL DATA:  Low back pain, increased fracture risk C/o left hip pain x5 days. Denies fall/injury. Left pedal pulses noted. EXAM: CT LUMBAR SPINE WITHOUT CONTRAST TECHNIQUE: Multidetector CT imaging of the lumbar spine was performed without intravenous contrast administration. Multiplanar CT image reconstructions were also generated. RADIATION DOSE REDUCTION: This exam was performed according to the departmental dose-optimization program which includes automated exposure control, adjustment of the mA and/or kV according to patient size and/or use of iterative reconstruction technique. COMPARISON:  CT abdomen pelvis 03/22/2017, x-ray lumbar spine 09/13/2020 FINDINGS: Segmentation: 5 lumbar type vertebrae. Alignment: Mild retrolisthesis of L1 on L2. Vertebrae: Diffusely decreased bone density. Multilevel vertebral body  hemangiomas. Interval development of an age-indeterminate complete burst fracture of the L2 vertebral body with a sclerotic appearance of the remaining vertebra with up to 99% vertebral body height loss and 6 mm retropulsion into the central canal. No acute fracture or focal pathologic process. Paraspinal and other soft tissues: Negative. Disc levels: Intervertebral disc space vacuum phenomenon at the L1-L2 and L2-L3 level. Otherwise maintained. Other: Severe atherosclerotic plaque of the aorta. Interval development of an aneurysmal suprarenal abdominal aorta with a caliber up to 3.2 cm. The infrarenal abdominal aorta is normal in caliber. Multilevel thoracic vertebral body hemangioma. Subacute to chronic healing right posterior tenth, eleventh, twelfth rib fractures. Chronic slightly more conspicuous lucent benign-appearing lesion of the right iliac bone (5:106). Please see separately dictated CT left femur 05/11/2023.  IMPRESSION: 1. Interval development of an age-indeterminate complete burst fracture of the L2 vertebral body with a sclerotic appearance of the remaining vertebra with up to 99% vertebral body height loss and 6 mm retropulsion into the central canal. Correlate with point tenderness to palpation to evaluate for an acute component. Pathologic fracture is not excluded given sclerosis of the majority of the vertebra. 2. 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 Recommend follow-up ultrasound every 3 years. (Ref.: J Vasc Surg. 2018; 67:2-77 and J Am Coll Radiol 2013;10(10):789-794.) Electronically Signed   By: Tish Frederickson M.D.   On: 05/11/2023 20:44   DG Pelvis 1-2 Views  Result Date: 05/11/2023 CLINICAL DATA:  Left hip pain for 5 days No known injury EXAM: LEFT KNEE - 1-2 VIEW; PELVIS - 1-2 VIEW COMPARISON:  None available FINDINGS: Pelvis: No fracture or dislocation. Evaluation of the left hip is limited due to internal rotation. Extensive Atherosclerotic changes seen  throughout visualized arterial segments. Left knee: No fracture or dislocation. Atherosclerotic calcifications noted throughout the visualized vessels. IMPRESSION: No acute abnormality of the pelvis or left knee. Electronically Signed   By: Acquanetta Belling M.D.   On: 05/11/2023 20:25   DG Knee 2 Views Left  Result Date: 05/11/2023 CLINICAL DATA:  Left hip pain for 5 days No known injury EXAM: LEFT KNEE - 1-2 VIEW; PELVIS - 1-2 VIEW COMPARISON:  None available FINDINGS: Pelvis: No fracture or dislocation. Evaluation of the left hip is limited due to internal rotation. Extensive Atherosclerotic changes seen throughout visualized arterial segments. Left knee: No fracture or dislocation. Atherosclerotic calcifications noted throughout the visualized vessels. IMPRESSION: No acute abnormality of the pelvis or left knee. Electronically Signed   By: Acquanetta Belling M.D.   On: 05/11/2023 20:25      Assessment/Plan    Principal Problem:   Closed compression fracture of L2 vertebra (HCC) Active Problems:   Essential hypertension   Hypokalemia   Macrocytic anemia   Chronic diastolic CHF (congestive heart failure) (HCC)   Generalized weakness   Acute cystitis   Dehydration   Acute prerenal azotemia   Paroxysmal atrial fibrillation (HCC)   AAA (abdominal aortic aneurysm) (HCC)   Hyperlipidemia   GAD (generalized anxiety disorder)   Acquired hypothyroidism     #) L2 burst fracture: In the setting of patient's reported new onset low back pain over the last 4 to 5 days, today CT lumbar spine shows an age-indeterminate burst fracture of the L2 vertebral body with 99% vertebral body height loss and 6 mm retropulsion.  Not associate with any overt red flag signs, as above.  EDP discussed patient's case with on-call neurosurgery, who did not feel that there is an indication for urgent surgical intervention, but rather recommended initiation of conservative measures, including T LSO brace, pain control, PT/OT  consults, and recommended outpatient follow-up in neurosurgery clinic.  Of note, will presentation is not associate any acute focal weakness, the patient conveys poor pain control, limiting her mobility at this time, and also impacting her ability to independently perform her ADLs.  Plan: Neurosurgery consulted, as above.  TLSO brace.  Percocet 5/225 mg p.o. every 4 hours as needed, morphine 3 mg IV every 2 hours as needed for pain refractory to Percocet.  Fall precautions.  PT/OT consults ordered.  Incentive spirometry.  Resume outpatient as needed Zanaflex.  Continue the Lidoderm patch initiated in the ED this evening.                 #)  Hypokalemia: presenting potassium level noted to be 3.0.  Corresponding magnesium level 1.7.  She received 40 mill equivalents of oral potassium chloride in the ED this evening  Plan: monitor on tele. Will provide an additional KCl  40  meq  po x 1 dose at 2 AM on 05/12/2023. CMP, mag level in the AM.                   #) Generalized weakness:  4-5 day  duration of generalized weakness, in the absence of any evidence of acute focal neurologic deficits, including no evidence of acute focal weakness to suggest acute CVA.  Suspect multifactorial contributions from physiologic stress stemming from presenting suspected urinary tract infection, as well as contribution from clinical evidence of dehydration as well as the physiologic consequences of prolonged discomfort relating to her low back pain in the setting of L2 burst fracture.  No clinical evidence of additional infectious process at this time.  Her anemia is noted, although this appears to be on at least a subacute timeframe as opposed to representing an acute process, as further detailed below.  Will further eval for any additional contributions from endocrine/metabolic sources, as detailed below.   Plan: work-up and management of presenting  uti and dehydration, as described below,  including continuation of Rocephin as well as initiation of IV fluids. PT/OT consults ordered for the AM. Fall precautions. CMP/CBC in the AM. Check TSH, check B12 level.                #) Acute cystitis: Suspect that presentation and laboratory findings are consistent with urinary tract infection in the setting of the patient's new onset low back discomfort and generalized weakness, with presenting urinalysis demonstrating significant pyuria, many bacteria, moderate leukocyte esterase as well as positive nitrate findings in the absence of any squamous epithelial cells to suggest a contaminated specimen.  Given her overall presentation with these acute symptoms, will treat as a acute urinary tract infection, providing 3 to 5 days of associated antibiotic coverage for uncomplicated urinary tract infection.  Of note, she received a dose of Rocephin in the ED this evening.  In the absence of objective fever and in the absence of leukocytosis, SIRS criteria not met for sepsis at this time.  Plan: Continue Rocephin.  Add on urine culture.  Repeat CBC in the morning.               #) Suprarenal abdominal aortic aneurysm: Observed on tonight's CT L-spine, measuring up to 3.2 cm, without evidence of rupture/dissection, with associated radiology recommendation for follow-up ultrasound every 3 years.  Plan: Will convey radiology recommendation for every 3 year ultrasound follow-up, as above.             #) Subacute anemia: From 2028-2022 it appears that her baseline hemoglobin was in the range of 12-13, however, after a 18-month gap in hemoglobin data points, it appears that her hemoglobin since June 2024 has been between 8.4-8.8 and associated with macrocytic findings.  Therefore, the timeframe for her anemia appears most consistent with either subacute versus chronic duration.  No evidence of active bleed.  In the setting of macrocytic findings, will check B12, folic acid  level, INR to evaluate hepatic synthetic function.  Consideration could also be given to pursuit of peripheral smear.  Alcohol does not appear to be providing contributory role.  It is noted that she is on daily oral iron supplementation as an outpatient.  Plan: Check iron  studies, B12, folic acid level, reticulocyte count, INR, PTT.  Repeat CBC in the morning.  Continue outpatient daily oral iron supplementation.                     #) Dehydration: Clinical suspicion for such, including the appearance of dry oral mucous membranes as well as laboratory findings notable for acute prerenal azotemia.  Her report of a recent incline in oral intake over the last 4 to 5 days Appears to be in the setting of   as a result of diminished ambulatory activity stemming from poor pain control relating to her new onset low back discomfort.  No e/o associated hypotension.  This is compounded by her outpatient use of chlorthalidone, which will be held for now, with pursuing gentle IV fluids, as outlined below.   Plan: Monitor strict I's and O's.  Daily weights.  CMP in the morning. IVF's in form of normal saline at 75 cc/h x 8 hours.  Hold home chlorthalidone for now.                   #) Essential Hypertension: documented h/o such, with outpatient antihypertensive regimen including chlorthalidone, losartan, diltiazem.  SBP's in the ED today: 140s to 150s mmHg.   Plan: Close monitoring of subsequent BP via routine VS. in the setting of presenting dehydration, will hold home chlorthalidone for now.  Resume home diltiazem and losartan.                    #) Paroxysmal atrial fibrillation: Documented history of such. In setting of CHA2DS2-VASc score of 5, there is an indication for chronic anticoagulation for thromboembolic prophylaxis. Consistent with this, patient is chronically anticoagulated on Eliquis. Home AV nodal blocking regimen: Diltiazem.  Additionally, she is  on daily amiodarone as an outpatient.  Most recent echocardiogram occurred in October 2018 and was notable for LVEF 60 to 65%, no evidence of focal wall motion abnormalities, grade 2 diastolic dysfunction, moderately reduced right ventricular systolic function, mildly dilated right atrium.   Plan: monitor strict I's & O's and daily weights. CMP/CBC in AM.  Recheck serum mag level in the morning.  Further evaluation management of presenting hypokalemia, as above.  Continue home AV nodal blocking regimen.  Continue outpatient amiodarone and Eliquis.                   #) Chronic diastolic heart failure: documented history of such, with most recent echocardiogram performed October 2018, notable for grade 2 diastolic dysfunction, with additional results as described above. No clinical evidence to suggest acutely decompensated heart failure at this time. home diuretic regimen reportedly consists of the following: Chlorthalidone, which will be held for now given clinical evidence to suggest presenting dehydration in the context of recent decline in oral intake.   Plan: monitor strict I's & O's and daily weights. Repeat CMP in AM. Check serum mag level.  Hold home chlorthalidone for now, as above.                 #) Hyperlipidemia: documented h/o such. On atorvastatin as outpatient.   Plan: continue home statin.                   #) Generalized anxiety disorder: documented h/o such. On Zoloft as well as BuSpar as outpatient.    Plan: Continue outpatient Zoloft and BuSpar.                 #)  acquired hypothyroidism: documented h/o such, on Synthroid as outpatient.   Plan: cont home Synthroid.  Check TSH in the setting of her presenting generalized weakness.         DVT prophylaxis: SCD's plus continuation of outpatient Eliquis  Code Status: Full code Family Communication: none Disposition Plan: Per Rounding Team Consults called: EDP  discussed patient's case and imaging with on-call neurosurgery, as further detailed above;  Admission status: Inpatient    I SPENT GREATER THAN 75  MINUTES IN CLINICAL CARE TIME/MEDICAL DECISION-MAKING IN COMPLETING THIS ADMISSION.     Chaney Born Clyde Zarrella DO [facility] From 7PM - 7AM   05/11/2023, 11:30 PM

## 2023-05-11 NOTE — ED Notes (Signed)
ED TO INPATIENT HANDOFF REPORT  Name/Age/Gender Peggy Armstrong 77 y.o. female  Code Status    Code Status Orders  (From admission, onward)           Start     Ordered   05/11/23 2326  Full code  Continuous       Question:  By:  Answer:  Consent: discussion documented in EHR   05/11/23 2325           Code Status History     Date Active Date Inactive Code Status Order ID Comments User Context   11/05/2017 0127 11/09/2017 2147 Full Code [id number]  Pearson Grippe, MD ED   03/22/2017 2340 03/30/2017 2110 Full Code [id number]  Eduard Clos, MD ED       Home/SNF/Other Rehab  Chief Complaint Closed compression fracture of L2 vertebra (HCC) [S32.020A]  Level of Care/Admitting Diagnosis ED Disposition     ED Disposition  Admit   Condition  --   Comment  Hospital Area: [facility] [[id number]]  Level of Care: Telemetry [5]  Admit to tele based on following criteria: Monitor for Ischemic changes  May admit patient to [facility] or [facility] if equivalent level of care is available:: No  Covid Evaluation: Asymptomatic - no recent exposure (last 10 days) testing not required  Diagnosis: Closed compression fracture of L2 vertebra [facility]) [[id number]]  Admitting Physician: Angie Fava [[id number]]  Attending Physician: Angie Fava [[id number]]  Certification:: I certify this patient will need inpatient services for at least 2 midnights  Expected Medical Readiness: 05/13/2023          Medical History Past Medical History:  Diagnosis Date   Acquired hypothyroidism    Atrial fibrillation (HCC)    DVT (deep venous thrombosis) (HCC)    right DVT   GAD (generalized anxiety disorder)    GERD (gastroesophageal reflux disease)    Gout    Gout    Hypertension    RA (rheumatoid arthritis) (HCC)     Allergies Allergies  Allergen Reactions   Codeine Nausea And Vomiting    Made "very sick"    IV Location/Drains/Wounds Patient  Lines/Drains/Airways Status     Active Line/Drains/Airways     Name Placement date Placement time Site Days   Peripheral IV 05/11/23 20 G 1" Anterior;Left Forearm 05/11/23  2147  Forearm  less than 1            Labs/Imaging Results for orders placed or performed during the hospital encounter of 05/11/23 (from the past 48 hour(s))  Urinalysis, Routine w reflex microscopic -Urine, Clean Catch     Status: Abnormal   Collection Time: 05/11/23  5:21 PM  Result Value Ref Range   Color, Urine YELLOW YELLOW   APPearance CLOUDY (A) CLEAR   Specific Gravity, Urine 1.012 1.005 - 1.030   pH 7.0 5.0 - 8.0   Glucose, UA NEGATIVE NEGATIVE mg/dL   Hgb urine dipstick LARGE (A) NEGATIVE   Bilirubin Urine NEGATIVE NEGATIVE   Ketones, ur NEGATIVE NEGATIVE mg/dL   Protein, ur NEGATIVE NEGATIVE mg/dL   Nitrite POSITIVE (A) NEGATIVE   Leukocytes,Ua MODERATE (A) NEGATIVE   RBC / HPF 11-20 0 - 5 RBC/hpf   WBC, UA 21-50 0 - 5 WBC/hpf   Bacteria, UA MANY (A) NONE SEEN   Squamous Epithelial / HPF 0-5 0 - 5 /HPF   Mucus PRESENT     Comment: Performed at [facility], [address]  [place], [place] [address]  CBC     Status: Abnormal   Collection Time: 05/11/23  7:44 PM  Result Value Ref Range   WBC 8.3 4.0 - 10.5 K/uL   RBC 2.85 (L) 3.87 - 5.11 MIL/uL   Hemoglobin 8.8 (L) 12.0 - 15.0 g/dL   HCT 13.2 (L) 44.0 - 10.2 %   MCV 106.3 (H) 80.0 - 100.0 fL   MCH 30.9 26.0 - 34.0 pg   MCHC 29.0 (L) 30.0 - 36.0 g/dL   RDW 72.5 (H) 36.6 - 44.0 %   Platelets 99 (L) 150 - 400 K/uL   nRBC 0.0 0.0 - 0.2 %    Comment: Performed at [facility], [address], [place], [place] [address]  Basic metabolic panel     Status: Abnormal   Collection Time: 05/11/23  7:44 PM  Result Value Ref Range   Sodium 137 135 - 145 mmol/L   Potassium 3.0 (L) 3.5 - 5.1 mmol/L   Chloride 101 98 - 111 mmol/L   CO2 26 22 - 32 mmol/L   Glucose, Bld 106 (H) 70 - 99 mg/dL    Comment: Glucose  reference range applies only to samples taken after fasting for at least 8 hours.   BUN 32 (H) 8 - 23 mg/dL   Creatinine, Ser 5.95 (H) 0.44 - 1.00 mg/dL   Calcium 8.8 (L) 8.9 - 10.3 mg/dL   GFR, Estimated 57 (L) >60 mL/min    Comment: (NOTE) Calculated using the CKD-EPI Creatinine Equation (2021)    Anion gap 10 5 - 15    Comment: Performed at [facility], [address], [place], [place] [address]   CT FEMUR LEFT WO CONTRAST  Result Date: 05/11/2023 CLINICAL DATA:  Fracture, femur C/o left hip pain x5 days. Denies fall/injury. Left pedal pulses noted. EXAM: CT OF THE LOWER LEFT EXTREMITY WITHOUT CONTRAST TECHNIQUE: Multidetector CT imaging of the lower left extremity was performed according to the standard protocol. RADIATION DOSE REDUCTION: This exam was performed according to the departmental dose-optimization program which includes automated exposure control, adjustment of the mA and/or kV according to patient size and/or use of iterative reconstruction technique. COMPARISON:  X-ray pelvis and left knee 05/11/2023 FINDINGS: Bones/Joint/Cartilage No evidence of fracture, dislocation, or joint effusion. No evidence of severe arthropathy. No aggressive appearing focal bone abnormality. Ligaments Suboptimally assessed by CT. Muscles and Tendons Grossly unremarkable. Soft tissues Mild left hip/gluteal subcutaneus soft tissue edema with associated fat density lesion within the soft tissues measuring 3.6 x 2.4 cm. Associated central calcification and heterogeneity of this fat density within the left gluteal soft tissues. Otherwise atherosclerotic plaque. IMPRESSION: 1. A 3.3 cm lipomatous lesion that demonstrates calcification and slight complexity. Recommend attention on follow-up. 2.  Negative for acute traumatic injury. Electronically Signed   By: Tish Frederickson M.D.   On: 05/11/2023 20:47   CT Lumbar Spine Wo Contrast  Result Date: 05/11/2023 CLINICAL DATA:  Low back pain,  increased fracture risk C/o left hip pain x5 days. Denies fall/injury. Left pedal pulses noted. EXAM: CT LUMBAR SPINE WITHOUT CONTRAST TECHNIQUE: Multidetector CT imaging of the lumbar spine was performed without intravenous contrast administration. Multiplanar CT image reconstructions were also generated. RADIATION DOSE REDUCTION: This exam was performed according to the departmental dose-optimization program which includes automated exposure control, adjustment of the mA and/or kV according to patient size and/or use of iterative reconstruction technique. COMPARISON:  CT abdomen pelvis 03/22/2017, x-ray lumbar spine 09/13/2020 FINDINGS: Segmentation: 5  lumbar type vertebrae. Alignment: Mild retrolisthesis of L1 on L2. Vertebrae: Diffusely decreased bone density. Multilevel vertebral body hemangiomas. Interval development of an age-indeterminate complete burst fracture of the L2 vertebral body with a sclerotic appearance of the remaining vertebra with up to 99% vertebral body height loss and 6 mm retropulsion into the central canal. No acute fracture or focal pathologic process. Paraspinal and other soft tissues: Negative. Disc levels: Intervertebral disc space vacuum phenomenon at the L1-L2 and L2-L3 level. Otherwise maintained. Other: Severe atherosclerotic plaque of the aorta. Interval development of an aneurysmal suprarenal abdominal aorta with a caliber up to 3.2 cm. The infrarenal abdominal aorta is normal in caliber. Multilevel thoracic vertebral body hemangioma. Subacute to chronic healing right posterior tenth, eleventh, twelfth rib fractures. Chronic slightly more conspicuous lucent benign-appearing lesion of the right iliac bone (5:106). Please see separately dictated CT left femur 05/11/2023. IMPRESSION: 1. Interval development of an age-indeterminate complete burst fracture of the L2 vertebral body with a sclerotic appearance of the remaining vertebra with up to 99% vertebral body height loss and 6 mm  retropulsion into the central canal. Correlate with point tenderness to palpation to evaluate for an acute component. Pathologic fracture is not excluded given sclerosis of the majority of the vertebra. 2. 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 Recommend follow-up ultrasound every 3 years. (Ref.: J Vasc Surg. 2018; 67:2-77 and J Am Coll Radiol 2013;10(10):789-794.) Electronically Signed   By: Tish Frederickson M.D.   On: 05/11/2023 20:44   DG Pelvis 1-2 Views  Result Date: 05/11/2023 CLINICAL DATA:  Left hip pain for 5 days No known injury EXAM: LEFT KNEE - 1-2 VIEW; PELVIS - 1-2 VIEW COMPARISON:  None available FINDINGS: Pelvis: No fracture or dislocation. Evaluation of the left hip is limited due to internal rotation. Extensive Atherosclerotic changes seen throughout visualized arterial segments. Left knee: No fracture or dislocation. Atherosclerotic calcifications noted throughout the visualized vessels. IMPRESSION: No acute abnormality of the pelvis or left knee. Electronically Signed   By: Acquanetta Belling M.D.   On: 05/11/2023 20:25   DG Knee 2 Views Left  Result Date: 05/11/2023 CLINICAL DATA:  Left hip pain for 5 days No known injury EXAM: LEFT KNEE - 1-2 VIEW; PELVIS - 1-2 VIEW COMPARISON:  None available FINDINGS: Pelvis: No fracture or dislocation. Evaluation of the left hip is limited due to internal rotation. Extensive Atherosclerotic changes seen throughout visualized arterial segments. Left knee: No fracture or dislocation. Atherosclerotic calcifications noted throughout the visualized vessels. IMPRESSION: No acute abnormality of the pelvis or left knee. Electronically Signed   By: Acquanetta Belling M.D.   On: 05/11/2023 20:25    Pending Labs Unresulted Labs (From admission, onward)     Start     Ordered   05/12/23 0500  CBC with Differential/Platelet  Tomorrow morning,   R        05/11/23 2326   05/12/23 0500  Comprehensive metabolic panel  Tomorrow morning,   R         05/11/23 2326   05/12/23 0500  Magnesium  Tomorrow morning,   R        05/11/23 2326   05/11/23 2327  Magnesium  Add-on,   AD        05/11/23 2326            Vitals/Pain Today's Vitals   05/11/23 1832 05/11/23 2101 05/11/23 2140 05/11/23 2155  BP:  (!) 149/99    Pulse:  82  74   Resp:  16 16   Temp:    97.9 F (36.6 C)  TempSrc:    Oral  SpO2:  99% 94%   Weight:      PainSc: 2        Isolation Precautions No active isolations  Medications Medications  lidocaine (LIDODERM) 5 % 1 patch (1 patch Transdermal Patch Applied 05/11/23 1801)  acetaminophen (TYLENOL) tablet 650 mg (has no administration in time range)    Or  acetaminophen (TYLENOL) suppository 650 mg (has no administration in time range)  melatonin tablet 3 mg (has no administration in time range)  ondansetron (ZOFRAN) injection 4 mg (has no administration in time range)  naloxone (NARCAN) injection 0.4 mg (has no administration in time range)  oxyCODONE-acetaminophen (PERCOCET/ROXICET) 5-325 MG per tablet 1 tablet (has no administration in time range)  morphine (PF) 4 MG/ML injection 3 mg (has no administration in time range)  oxyCODONE-acetaminophen (PERCOCET/ROXICET) 5-325 MG per tablet 1 tablet (1 tablet Oral Given 05/11/23 1801)  acetaminophen (TYLENOL) tablet 650 mg (650 mg Oral Given 05/11/23 1802)  ondansetron (ZOFRAN-ODT) disintegrating tablet 4 mg (4 mg Oral Given 05/11/23 1802)  morphine (PF) 2 MG/ML injection 2 mg (2 mg Intravenous Given 05/11/23 2147)  potassium chloride SA (KLOR-CON M) CR tablet 40 mEq (40 mEq Oral Given 05/11/23 2148)  cefTRIAXone (ROCEPHIN) 1 g in sodium chloride 0.9 % 100 mL IVPB (0 g Intravenous Stopped 05/11/23 2341)    Mobility non-ambulatory

## 2023-05-11 NOTE — ED Triage Notes (Signed)
C/o left hip pain x5 days.  Denies fall/injury.  Left pedal pulses noted.

## 2023-05-11 NOTE — ED Notes (Signed)
Pt placed on 2lpm O2 via [place] due to O2 sats in the high 80s after morphine administration

## 2023-05-11 NOTE — ED Notes (Signed)
Patient up to bedside commode.

## 2023-05-11 NOTE — ED Provider Notes (Signed)
[place] EMERGENCY DEPARTMENT AT [facility] Provider Note   CSN: [id number] Arrival date & time: 05/11/23  1645     History  Chief Complaint  Patient presents with   Hip Pain    Peggy Armstrong is a 77 y.o. female.  77 year old female present emergency department for left knee/hip pain.  Reports some low back pain as well.  Symptoms started 3 days ago.  No trauma or inciting events that she can recall.  Intensified last night and today.  Denies numbness tingling changes in sensation in lower extremities.  No bowel or bladder incontinence.   Hip Pain       Home Medications Prior to Admission medications   Medication Sig Start Date End Date Taking? Authorizing Provider  acetaminophen (TYLENOL) 325 MG tablet Take 650 mg by mouth every 6 (six) hours as needed for mild pain (pain score 1-3) or moderate pain (pain score 4-6).   Yes [provider]  allopurinol (ZYLOPRIM) 300 MG tablet Take 300 mg by mouth daily.  05/06/15  Yes [provider]  amiodarone (PACERONE) 200 MG tablet Take 1 tablet (200 mg total) by mouth daily. NEED APPOINTMENT 02/15/23  Yes Croitoru, Mihai, MD  apixaban (ELIQUIS) 5 MG TABS tablet Take 1 tablet (5 mg total) by mouth 2 (two) times daily. Restart on 11/11/2017 12/19/22  Yes Alver Sorrow, NP  atorvastatin (LIPITOR) 20 MG tablet Take 1 tablet (20 mg total) by mouth daily. 12/19/22  Yes Alver Sorrow, NP  busPIRone (BUSPAR) 10 MG tablet Take 1 tablet by mouth 2 (two) times daily. 07/18/18  Yes [provider]  chlorthalidone (HYGROTON) 25 MG tablet Take 1 tablet (25 mg total) by mouth daily. NEED APPOINTMENT 02/15/23 02/10/24 Yes Croitoru, Mihai, MD  cholecalciferol (VITAMIN D) 1000 units tablet Take 1,000 Units by mouth daily.   Yes [provider]  diltiazem (CARDIZEM CD) 120 MG 24 hr capsule Take 1 capsule (120 mg total) by mouth daily. 12/19/22  Yes Alver Sorrow, NP  famotidine (PEPCID) 20 MG tablet  Take 1 tablet by mouth every evening. 07/10/18  Yes [provider]  ferrous sulfate 325 (65 FE) MG tablet Take 325 mg by mouth daily with breakfast.   Yes [provider]  KLOR-CON M20 20 MEQ tablet Take 20 mEq by mouth daily.   Yes [provider]  levothyroxine (SYNTHROID) 75 MCG tablet Take 75 mcg by mouth daily before breakfast.   Yes [provider]  losartan (COZAAR) 25 MG tablet Take 1 tablet (25 mg total) by mouth daily. 12/19/22  Yes Alver Sorrow, NP  pantoprazole (PROTONIX) 40 MG tablet Take 40 mg by mouth daily.   Yes [provider]  sertraline (ZOLOFT) 25 MG tablet Take 25 mg by mouth daily.  07/07/17  Yes [provider]  tiZANidine (ZANAFLEX) 2 MG tablet Take by mouth. 1/2 tablet TID PRN 10/01/20  Yes [provider]  vitamin B-12 (CYANOCOBALAMIN) 1000 MCG tablet Take 1,000 mcg by mouth daily.   Yes [provider]  furosemide (LASIX) 20 MG tablet Take 20 mg by mouth daily. Patient not taking: Reported on 05/11/2023 05/03/23   [provider]      Allergies    Codeine    Review of Systems   Review of Systems  Physical Exam Updated Vital Signs BP (!) 149/99   Pulse 74   Temp 97.9 F (36.6 C) (Oral)   Resp 16   Wt 104 kg  SpO2 94%   BMI 43.32 kg/m  Physical Exam Vitals and nursing note reviewed.  Constitutional:      General: She is not in acute distress.    Appearance: She is not toxic-appearing.  HENT:     Nose: Nose normal.     Mouth/Throat:     Mouth: Mucous membranes are moist.  Eyes:     Conjunctiva/sclera: Conjunctivae normal.  Cardiovascular:     Rate and Rhythm: Normal rate and regular rhythm.  Pulmonary:     Effort: Pulmonary effort is normal.  Abdominal:     General: Abdomen is flat. There is no distension.     Palpations: Abdomen is soft.     Tenderness: There is no abdominal tenderness. There is no guarding or rebound.  Musculoskeletal:     Comments: Bruising  noted to the left knee.  Diffusely tender.  Tenderness to the lateral aspect of the thigh as well.  Minor tenderness to the joint.  Some minor tenderness diffusely across low back.  Skin:    General: Skin is warm and dry.     Capillary Refill: Capillary refill takes less than 2 seconds.  Neurological:     General: No focal deficit present.     Mental Status: She is alert and oriented to person, place, and time.  Psychiatric:        Mood and Affect: Mood normal.        Behavior: Behavior normal.     ED Results / Procedures / Treatments   Labs (all labs ordered are listed, but only abnormal results are displayed) Labs Reviewed  URINALYSIS, ROUTINE W REFLEX MICROSCOPIC - Abnormal; Notable for the following components:      Result Value   APPearance CLOUDY (*)    Hgb urine dipstick LARGE (*)    Nitrite POSITIVE (*)    Leukocytes,Ua MODERATE (*)    Bacteria, UA MANY (*)    All other components within normal limits  CBC - Abnormal; Notable for the following components:   RBC 2.85 (*)    Hemoglobin 8.8 (*)    HCT 30.3 (*)    MCV 106.3 (*)    MCHC 29.0 (*)    RDW 16.4 (*)    Platelets 99 (*)    All other components within normal limits  BASIC METABOLIC PANEL - Abnormal; Notable for the following components:   Potassium 3.0 (*)    Glucose, Bld 106 (*)    BUN 32 (*)    Creatinine, Ser 1.01 (*)    Calcium 8.8 (*)    GFR, Estimated 57 (*)    All other components within normal limits  MAGNESIUM  CBC WITH DIFFERENTIAL/PLATELET  COMPREHENSIVE METABOLIC PANEL  MAGNESIUM    EKG None  Radiology CT FEMUR LEFT WO CONTRAST  Result Date: 05/11/2023 CLINICAL DATA:  Fracture, femur C/o left hip pain x5 days. Denies fall/injury. Left pedal pulses noted. EXAM: CT OF THE LOWER LEFT EXTREMITY WITHOUT CONTRAST TECHNIQUE: Multidetector CT imaging of the lower left extremity was performed according to the standard protocol. RADIATION DOSE REDUCTION: This exam was performed according to the  departmental dose-optimization program which includes automated exposure control, adjustment of the mA and/or kV according to patient size and/or use of iterative reconstruction technique. COMPARISON:  X-ray pelvis and left knee 05/11/2023 FINDINGS: Bones/Joint/Cartilage No evidence of fracture, dislocation, or joint effusion. No evidence of severe arthropathy. No aggressive appearing focal bone abnormality. Ligaments Suboptimally assessed by CT. Muscles and Tendons Grossly unremarkable. Soft tissues Mild  left hip/gluteal subcutaneus soft tissue edema with associated fat density lesion within the soft tissues measuring 3.6 x 2.4 cm. Associated central calcification and heterogeneity of this fat density within the left gluteal soft tissues. Otherwise atherosclerotic plaque. IMPRESSION: 1. A 3.3 cm lipomatous lesion that demonstrates calcification and slight complexity. Recommend attention on follow-up. 2.  Negative for acute traumatic injury. Electronically Signed   By: Tish Frederickson M.D.   On: 05/11/2023 20:47   CT Lumbar Spine Wo Contrast  Result Date: 05/11/2023 CLINICAL DATA:  Low back pain, increased fracture risk C/o left hip pain x5 days. Denies fall/injury. Left pedal pulses noted. EXAM: CT LUMBAR SPINE WITHOUT CONTRAST TECHNIQUE: Multidetector CT imaging of the lumbar spine was performed without intravenous contrast administration. Multiplanar CT image reconstructions were also generated. RADIATION DOSE REDUCTION: This exam was performed according to the departmental dose-optimization program which includes automated exposure control, adjustment of the mA and/or kV according to patient size and/or use of iterative reconstruction technique. COMPARISON:  CT abdomen pelvis 03/22/2017, x-ray lumbar spine 09/13/2020 FINDINGS: Segmentation: 5 lumbar type vertebrae. Alignment: Mild retrolisthesis of L1 on L2. Vertebrae: Diffusely decreased bone density. Multilevel vertebral body hemangiomas. Interval  development of an age-indeterminate complete burst fracture of the L2 vertebral body with a sclerotic appearance of the remaining vertebra with up to 99% vertebral body height loss and 6 mm retropulsion into the central canal. No acute fracture or focal pathologic process. Paraspinal and other soft tissues: Negative. Disc levels: Intervertebral disc space vacuum phenomenon at the L1-L2 and L2-L3 level. Otherwise maintained. Other: Severe atherosclerotic plaque of the aorta. Interval development of an aneurysmal suprarenal abdominal aorta with a caliber up to 3.2 cm. The infrarenal abdominal aorta is normal in caliber. Multilevel thoracic vertebral body hemangioma. Subacute to chronic healing right posterior tenth, eleventh, twelfth rib fractures. Chronic slightly more conspicuous lucent benign-appearing lesion of the right iliac bone (5:106). Please see separately dictated CT left femur 05/11/2023. IMPRESSION: 1. Interval development of an age-indeterminate complete burst fracture of the L2 vertebral body with a sclerotic appearance of the remaining vertebra with up to 99% vertebral body height loss and 6 mm retropulsion into the central canal. Correlate with point tenderness to palpation to evaluate for an acute component. Pathologic fracture is not excluded given sclerosis of the majority of the vertebra. 2. 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 Recommend follow-up ultrasound every 3 years. (Ref.: J Vasc Surg. 2018; 67:2-77 and J Am Coll Radiol 2013;10(10):789-794.) Electronically Signed   By: Tish Frederickson M.D.   On: 05/11/2023 20:44   DG Pelvis 1-2 Views  Result Date: 05/11/2023 CLINICAL DATA:  Left hip pain for 5 days No known injury EXAM: LEFT KNEE - 1-2 VIEW; PELVIS - 1-2 VIEW COMPARISON:  None available FINDINGS: Pelvis: No fracture or dislocation. Evaluation of the left hip is limited due to internal rotation. Extensive Atherosclerotic changes seen throughout visualized  arterial segments. Left knee: No fracture or dislocation. Atherosclerotic calcifications noted throughout the visualized vessels. IMPRESSION: No acute abnormality of the pelvis or left knee. Electronically Signed   By: Acquanetta Belling M.D.   On: 05/11/2023 20:25   DG Knee 2 Views Left  Result Date: 05/11/2023 CLINICAL DATA:  Left hip pain for 5 days No known injury EXAM: LEFT KNEE - 1-2 VIEW; PELVIS - 1-2 VIEW COMPARISON:  None available FINDINGS: Pelvis: No fracture or dislocation. Evaluation of the left hip is limited due to internal rotation. Extensive Atherosclerotic changes seen throughout  visualized arterial segments. Left knee: No fracture or dislocation. Atherosclerotic calcifications noted throughout the visualized vessels. IMPRESSION: No acute abnormality of the pelvis or left knee. Electronically Signed   By: Acquanetta Belling M.D.   On: 05/11/2023 20:25    Procedures Procedures    Medications Ordered in ED Medications  lidocaine (LIDODERM) 5 % 1 patch (1 patch Transdermal Patch Applied 05/11/23 1801)  acetaminophen (TYLENOL) tablet 650 mg (has no administration in time range)    Or  acetaminophen (TYLENOL) suppository 650 mg (has no administration in time range)  melatonin tablet 3 mg (has no administration in time range)  ondansetron (ZOFRAN) injection 4 mg (has no administration in time range)  naloxone (NARCAN) injection 0.4 mg (has no administration in time range)  oxyCODONE-acetaminophen (PERCOCET/ROXICET) 5-325 MG per tablet 1 tablet (has no administration in time range)  morphine (PF) 4 MG/ML injection 3 mg (has no administration in time range)  oxyCODONE-acetaminophen (PERCOCET/ROXICET) 5-325 MG per tablet 1 tablet (1 tablet Oral Given 05/11/23 1801)  acetaminophen (TYLENOL) tablet 650 mg (650 mg Oral Given 05/11/23 1802)  ondansetron (ZOFRAN-ODT) disintegrating tablet 4 mg (4 mg Oral Given 05/11/23 1802)  morphine (PF) 2 MG/ML injection 2 mg (2 mg Intravenous Given 05/11/23  2147)  potassium chloride SA (KLOR-CON M) CR tablet 40 mEq (40 mEq Oral Given 05/11/23 2148)  cefTRIAXone (ROCEPHIN) 1 g in sodium chloride 0.9 % 100 mL IVPB (0 g Intravenous Stopped 05/11/23 2341)    ED Course/ Medical Decision Making/ A&P Clinical Course as of 05/11/23 2351  Thu May 11, 2023  1944 Pain somewhat improved after pain medications.  Attempted to get patient up to bedside commode, however took significant assistance per nursing report and patient complained of significant pain.  Will get CT's images to further evaluate for fracture. [TY]  2152 Urinalysis, Routine w reflex microscopic -Urine, Clean Catch(!) C/w UTI; antibiotics ordered.  [TY]  2152 CBC(!) Anemia noted; last to compare was 4 years ago and normal. Patient reports recent hgb 8.5 by PCP. No over bleeding.  [TY]  2153 Basic metabolic panel(!) Mild hypokalemia; repleted. Baseline CKD.  [TY]  2155 Repaging NSGY as I have not received call back.  [TY]  2253 Spoke with neurosurgery regarding CT scan findings.  Recommending pain control, physical therapy and TLSO brace.  No acute intervention needed at this time.  No need for MRI either. [TY]  2351 Spoke with hospitalist who agrees to admit patient for pain control in the setting of her acute L2 fracture.  [TY]    Clinical Course User Index [TY] Coral Spikes, DO                                 Medical Decision Making Well-appearing 77 year old female present emergency department for left hip pain.  Afebrile vital signs reassuring.  Left knee with some bruising, but no reported trauma.  Does not appear erythematous or swollen to suggest septic joint.  Pain does radiate up towards left hip and also having some low back pain.  Will get x-rays, pain meds and attempt to ambulate.  If unable to do so we will get CT scans.  See ED course for further MDM disposition.  Amount and/or Complexity of Data Reviewed Independent Historian:     Details: Daughter notes patient with  history of arthritis and somewhat chronic knee pain. External Data Reviewed:     Details: Patient taking Eliquis per chart  review and history of rheumatoid arthritis. Labs: ordered. Decision-making details documented in ED Course. Radiology: ordered.  Risk OTC drugs. Prescription drug management. Decision regarding hospitalization.         Final Clinical Impression(s) / ED Diagnoses Final diagnoses:  None    Rx / DC Orders ED Discharge Orders     None         Coral Spikes, DO 05/11/23 2351

## 2023-05-12 ENCOUNTER — Inpatient Hospital Stay (HOSPITAL_COMMUNITY): Payer: 59

## 2023-05-12 DIAGNOSIS — I480 Paroxysmal atrial fibrillation: Secondary | ICD-10-CM

## 2023-05-12 DIAGNOSIS — S32020A Wedge compression fracture of second lumbar vertebra, initial encounter for closed fracture: Secondary | ICD-10-CM | POA: Diagnosis not present

## 2023-05-12 DIAGNOSIS — N3000 Acute cystitis without hematuria: Secondary | ICD-10-CM

## 2023-05-12 DIAGNOSIS — E860 Dehydration: Secondary | ICD-10-CM | POA: Diagnosis present

## 2023-05-12 DIAGNOSIS — N19 Unspecified kidney failure: Secondary | ICD-10-CM | POA: Diagnosis present

## 2023-05-12 DIAGNOSIS — E039 Hypothyroidism, unspecified: Secondary | ICD-10-CM

## 2023-05-12 DIAGNOSIS — R531 Weakness: Secondary | ICD-10-CM

## 2023-05-12 DIAGNOSIS — F411 Generalized anxiety disorder: Secondary | ICD-10-CM | POA: Diagnosis present

## 2023-05-12 DIAGNOSIS — I714 Abdominal aortic aneurysm, without rupture, unspecified: Secondary | ICD-10-CM

## 2023-05-12 DIAGNOSIS — E785 Hyperlipidemia, unspecified: Secondary | ICD-10-CM | POA: Diagnosis present

## 2023-05-12 HISTORY — DX: Acute cystitis without hematuria: N30.00

## 2023-05-12 HISTORY — DX: Paroxysmal atrial fibrillation: I48.0

## 2023-05-12 HISTORY — DX: Abdominal aortic aneurysm, without rupture, unspecified: I71.40

## 2023-05-12 HISTORY — DX: Hyperlipidemia, unspecified: E78.5

## 2023-05-12 HISTORY — DX: Weakness: R53.1

## 2023-05-12 HISTORY — DX: Dehydration: E86.0

## 2023-05-12 HISTORY — DX: Unspecified kidney failure: N19

## 2023-05-12 HISTORY — DX: Hypothyroidism, unspecified: E03.9

## 2023-05-12 LAB — COMPREHENSIVE METABOLIC PANEL
ALT: 57 U/L — ABNORMAL HIGH (ref 0–44)
AST: 57 U/L — ABNORMAL HIGH (ref 15–41)
Albumin: 2.9 g/dL — ABNORMAL LOW (ref 3.5–5.0)
Alkaline Phosphatase: 105 U/L (ref 38–126)
Anion gap: 8 (ref 5–15)
BUN: 30 mg/dL — ABNORMAL HIGH (ref 8–23)
CO2: 25 mmol/L (ref 22–32)
Calcium: 8.7 mg/dL — ABNORMAL LOW (ref 8.9–10.3)
Chloride: 103 mmol/L (ref 98–111)
Creatinine, Ser: 0.93 mg/dL (ref 0.44–1.00)
GFR, Estimated: >60 mL/min (ref >60)
Glucose, Bld: 96 mg/dL (ref 70–99)
Potassium: 3.6 mmol/L (ref 3.5–5.1)
Sodium: 136 mmol/L (ref 135–145)
Total Bilirubin: 0.6 mg/dL (ref <1.2)
Total Protein: 5.7 g/dL — ABNORMAL LOW (ref 6.5–8.1)

## 2023-05-12 LAB — CBC WITH DIFFERENTIAL/PLATELET
Abs Immature Granulocytes: 0.06 10*3/uL (ref 0.00–0.07)
Basophils Absolute: 0 10*3/uL (ref 0.0–0.1)
Basophils Relative: 0 %
Eosinophils Absolute: 0 10*3/uL (ref 0.0–0.5)
Eosinophils Relative: 0 %
HCT: 29.1 % — ABNORMAL LOW (ref 36.0–46.0)
Hemoglobin: 8.5 g/dL — ABNORMAL LOW (ref 12.0–15.0)
Immature Granulocytes: 1 %
Lymphocytes Relative: 7 %
Lymphs Abs: 0.5 10*3/uL — ABNORMAL LOW (ref 0.7–4.0)
MCH: 31.3 pg (ref 26.0–34.0)
MCHC: 29.2 g/dL — ABNORMAL LOW (ref 30.0–36.0)
MCV: 107 fL — ABNORMAL HIGH (ref 80.0–100.0)
Monocytes Absolute: 0.6 10*3/uL (ref 0.1–1.0)
Monocytes Relative: 8 %
Neutro Abs: 5.9 10*3/uL (ref 1.7–7.7)
Neutrophils Relative %: 84 %
Platelets: 99 10*3/uL — ABNORMAL LOW (ref 150–400)
RBC: 2.72 MIL/uL — ABNORMAL LOW (ref 3.87–5.11)
RDW: 16.5 % — ABNORMAL HIGH (ref 11.5–15.5)
WBC: 7.1 10*3/uL (ref 4.0–10.5)
nRBC: 0 % (ref 0.0–0.2)

## 2023-05-12 LAB — IRON AND TIBC
Iron: 20 ug/dL — ABNORMAL LOW (ref 28–170)
Saturation Ratios: 8 % — ABNORMAL LOW (ref 10.4–31.8)
TIBC: 258 ug/dL (ref 250–450)
UIBC: 238 ug/dL

## 2023-05-12 LAB — VITAMIN B12: Vitamin B-12: 1052 pg/mL — ABNORMAL HIGH (ref 180–914)

## 2023-05-12 LAB — FERRITIN: Ferritin: 277 ng/mL (ref 11–307)

## 2023-05-12 LAB — RETICULOCYTES
Immature Retic Fract: 22.1 % — ABNORMAL HIGH (ref 2.3–15.9)
RBC.: 2.73 MIL/uL — ABNORMAL LOW (ref 3.87–5.11)
Retic Count, Absolute: 121.5 10*3/uL (ref 19.0–186.0)
Retic Ct Pct: 4.5 % — ABNORMAL HIGH (ref 0.4–3.1)

## 2023-05-12 LAB — TSH: TSH: 6.718 u[IU]/mL — ABNORMAL HIGH (ref 0.350–4.500)

## 2023-05-12 LAB — FOLATE: Folate: 7.7 ng/mL (ref >5.9)

## 2023-05-12 LAB — APTT: aPTT: 43 s — ABNORMAL HIGH (ref 24–36)

## 2023-05-12 LAB — MAGNESIUM: Magnesium: 1.9 mg/dL (ref 1.7–2.4)

## 2023-05-12 LAB — PROTIME-INR
INR: 1.7 — ABNORMAL HIGH (ref 0.8–1.2)
Prothrombin Time: 20 s — ABNORMAL HIGH (ref 11.4–15.2)

## 2023-05-12 MED ORDER — LEVOTHYROXINE SODIUM 50 MCG PO TABS
75.0000 ug | ORAL_TABLET | Freq: Every day | ORAL | Status: DC
  Administered 2023-05-12 – 2023-05-14 (×3): 75 ug via ORAL
  Filled 2023-05-12 (×3): qty 1

## 2023-05-12 MED ORDER — TIZANIDINE HCL 4 MG PO TABS: 2.0000 mg | ORAL_TABLET | Freq: Three times a day (TID) | ORAL | Status: DC | PRN

## 2023-05-12 MED ORDER — SODIUM CHLORIDE 0.9 % IV SOLN: INTRAVENOUS | Status: DC

## 2023-05-12 MED ORDER — LOSARTAN POTASSIUM 25 MG PO TABS
25.0000 mg | ORAL_TABLET | Freq: Every day | ORAL | Status: DC
  Filled 2023-05-12: qty 1

## 2023-05-12 MED ORDER — POTASSIUM CHLORIDE CRYS ER 20 MEQ PO TBCR
40.0000 meq | EXTENDED_RELEASE_TABLET | Freq: Once | ORAL | Status: AC
  Administered 2023-05-12: 40 meq via ORAL
  Filled 2023-05-12: qty 2

## 2023-05-12 MED ORDER — ATORVASTATIN CALCIUM 10 MG PO TABS
20.0000 mg | ORAL_TABLET | Freq: Every day | ORAL | Status: DC
  Administered 2023-05-12 – 2023-05-18 (×7): 20 mg via ORAL
  Filled 2023-05-12 (×7): qty 2

## 2023-05-12 MED ORDER — SERTRALINE HCL 25 MG PO TABS
25.0000 mg | ORAL_TABLET | Freq: Every day | ORAL | Status: DC
  Administered 2023-05-12 – 2023-05-18 (×7): 25 mg via ORAL
  Filled 2023-05-12 (×7): qty 1

## 2023-05-12 MED ORDER — FOLIC ACID 5 MG/ML IJ SOLN
1.0000 mg | Freq: Once | INTRAMUSCULAR | Status: AC
  Administered 2023-05-12: 1 mg via INTRAVENOUS
  Filled 2023-05-12: qty 0.2

## 2023-05-12 MED ORDER — FOLIC ACID 1 MG PO TABS
1.0000 mg | ORAL_TABLET | Freq: Every day | ORAL | Status: DC
  Administered 2023-05-13 – 2023-05-18 (×6): 1 mg via ORAL
  Filled 2023-05-12 (×6): qty 1

## 2023-05-12 MED ORDER — ACETAMINOPHEN 500 MG PO TABS
1000.0000 mg | ORAL_TABLET | Freq: Three times a day (TID) | ORAL | Status: DC
  Administered 2023-05-12 – 2023-05-18 (×18): 1000 mg via ORAL
  Filled 2023-05-12 (×18): qty 2

## 2023-05-12 MED ORDER — PANTOPRAZOLE SODIUM 40 MG PO TBEC
40.0000 mg | DELAYED_RELEASE_TABLET | Freq: Every day | ORAL | Status: DC
  Administered 2023-05-12 – 2023-05-14 (×3): 40 mg via ORAL
  Filled 2023-05-12 (×3): qty 1

## 2023-05-12 MED ORDER — BUSPIRONE HCL 10 MG PO TABS
10.0000 mg | ORAL_TABLET | Freq: Two times a day (BID) | ORAL | Status: DC
  Administered 2023-05-12 – 2023-05-18 (×14): 10 mg via ORAL
  Filled 2023-05-12 (×14): qty 1

## 2023-05-12 MED ORDER — SODIUM CHLORIDE 0.9 % IV SOLN
1.0000 g | INTRAVENOUS | Status: DC
  Administered 2023-05-12 – 2023-05-13 (×2): 1 g via INTRAVENOUS
  Filled 2023-05-12 (×2): qty 10

## 2023-05-12 MED ORDER — OXYCODONE HCL 5 MG PO TABS
5.0000 mg | ORAL_TABLET | ORAL | Status: DC | PRN
  Administered 2023-05-13 – 2023-05-18 (×7): 5 mg via ORAL
  Filled 2023-05-12 (×7): qty 1

## 2023-05-12 MED ORDER — ORAL CARE MOUTH RINSE: 15.0000 mL | OROMUCOSAL | Status: DC | PRN

## 2023-05-12 MED ORDER — ACETAMINOPHEN 650 MG RE SUPP: 650.0000 mg | Freq: Two times a day (BID) | RECTAL | Status: DC | PRN

## 2023-05-12 MED ORDER — ACETAMINOPHEN 325 MG PO TABS: 650.0000 mg | ORAL_TABLET | Freq: Two times a day (BID) | ORAL | Status: DC | PRN

## 2023-05-12 MED ORDER — LIDOCAINE 5 % EX PTCH
2.0000 | MEDICATED_PATCH | CUTANEOUS | Status: DC
  Administered 2023-05-12 – 2023-05-17 (×6): 2 via TRANSDERMAL
  Filled 2023-05-12 (×6): qty 2

## 2023-05-12 MED ORDER — FAMOTIDINE 20 MG PO TABS
20.0000 mg | ORAL_TABLET | Freq: Every evening | ORAL | Status: DC
  Administered 2023-05-12 – 2023-05-17 (×6): 20 mg via ORAL
  Filled 2023-05-12 (×6): qty 1

## 2023-05-12 MED ORDER — TIZANIDINE HCL 2 MG PO TABS
1.0000 mg | ORAL_TABLET | Freq: Three times a day (TID) | ORAL | Status: DC
  Administered 2023-05-12 – 2023-05-14 (×6): 1 mg via ORAL
  Filled 2023-05-12 (×7): qty 0.5

## 2023-05-12 MED ORDER — MORPHINE SULFATE (PF) 2 MG/ML IV SOLN
2.0000 mg | INTRAVENOUS | Status: DC | PRN
  Administered 2023-05-12 – 2023-05-14 (×3): 2 mg via INTRAVENOUS
  Filled 2023-05-12 (×3): qty 1

## 2023-05-12 MED ORDER — APIXABAN 5 MG PO TABS
5.0000 mg | ORAL_TABLET | Freq: Two times a day (BID) | ORAL | Status: DC
  Administered 2023-05-12 – 2023-05-14 (×6): 5 mg via ORAL
  Filled 2023-05-12 (×6): qty 1

## 2023-05-12 MED ORDER — SENNOSIDES-DOCUSATE SODIUM 8.6-50 MG PO TABS
1.0000 | ORAL_TABLET | Freq: Two times a day (BID) | ORAL | Status: DC
  Administered 2023-05-12 – 2023-05-18 (×13): 1 via ORAL
  Filled 2023-05-12 (×13): qty 1

## 2023-05-12 MED ORDER — AMIODARONE HCL 200 MG PO TABS
200.0000 mg | ORAL_TABLET | Freq: Every day | ORAL | Status: DC
  Administered 2023-05-12 – 2023-05-18 (×7): 200 mg via ORAL
  Filled 2023-05-12 (×7): qty 1

## 2023-05-12 MED ORDER — DILTIAZEM HCL ER COATED BEADS 120 MG PO CP24
120.0000 mg | ORAL_CAPSULE | Freq: Every day | ORAL | Status: DC
  Administered 2023-05-12: 120 mg via ORAL
  Filled 2023-05-12: qty 1

## 2023-05-12 MED ORDER — FERROUS SULFATE 325 (65 FE) MG PO TABS
325.0000 mg | ORAL_TABLET | Freq: Every day | ORAL | Status: DC
  Administered 2023-05-12 – 2023-05-18 (×7): 325 mg via ORAL
  Filled 2023-05-12 (×7): qty 1

## 2023-05-12 NOTE — Evaluation (Addendum)
Physical Therapy Evaluation Patient Details Name: Peggy Armstrong MRN: [id number] DOB: 1945-10-24 Today's Date: 05/12/2023  History of Present Illness  Patient is a 77 yo female presenting to the ED for L knee and hip pain and minimal low back pain for the last 5 days on 05/11/23. Found to have burst L2 fracture with CT. Neurosurgery recommended TLSO and conservative management. PMH includes: essential pretension, paroxysmal atrial fibrillation chronically anticoagulated on Eliquis, hyperlipidemia, chronic diastolic heart failure  Clinical Impression  Pt admitted with above diagnosis. At baseline, pt is ambulatory with RW.  She was able to climb a flight of stairs with min A.  Today, pt limited by pain.  She had received pain meds but still in 10/10 pain at EOB. Pain eases at rest.  Required total x 2 to transfer to EOB and unable to further progress.   Pt currently with functional limitations due to the deficits listed below (see PT Problem List). Pt will benefit from acute skilled PT to increase their independence and safety with mobility to allow discharge.  Patient will benefit from continued inpatient follow up therapy, <3 hours/day at dc/         If plan is discharge home, recommend the following: Two people to help with walking and/or transfers;Two people to help with bathing/dressing/bathroom   Can travel by private vehicle   No    Equipment Recommendations Wheelchair cushion (measurements PT);Wheelchair (measurements PT)  Recommendations for Other Services       Functional Status Assessment Patient has had a recent decline in their functional status and demonstrates the ability to make significant improvements in function in a reasonable and predictable amount of time.     Precautions / Restrictions Precautions Precautions: Fall;Back Precaution Booklet Issued: Yes (comment) Precaution Comments: reviewed and provided handout Required Braces or Orthoses: Spinal Brace Spinal  Brace: Thoracolumbosacral orthotic;Applied in sitting position Restrictions Weight Bearing Restrictions: No      Mobility  Bed Mobility Overal bed mobility: Needs Assistance Bed Mobility: Rolling, Sidelying to Sit, Sit to Supine Rolling: Total assist, +2 for physical assistance, +2 for safety/equipment Sidelying to sit: Total assist, +2 for physical assistance, +2 for safety/equipment   Sit to supine: Total assist, +2 for physical assistance, +2 for safety/equipment   General bed mobility comments: Patient requiring significant assist to roll, unable to bring legs off of bed without total A, and then total A to elevate trunk and move legs off of bed. Patient yelling stating, "its too much, I cant do this" and then returned to supine with total A of 2    Transfers                   General transfer comment: unable to transition due to pain    Ambulation/Gait                  Stairs            Wheelchair Mobility     Tilt Bed    Modified Rankin (Stroke Patients Only)       Balance Overall balance assessment: Needs assistance Sitting-balance support: Bilateral upper extremity supported, Feet supported Sitting balance-Leahy Scale: Poor Sitting balance - Comments: significant bracing sitting EOB due to pain; providing mod support for pain control                                     Pertinent Vitals/Pain  Pain Assessment Pain Assessment: Faces Faces Pain Scale: Hurts worst Pain Location: with movement Low back did not radiate Pain Descriptors / Indicators: Discomfort, Grimacing, Guarding Pain Intervention(s): Limited activity within patient's tolerance, Monitored during session, Repositioned, Premedicated before session    Home Living Family/patient expects to be discharged to:: Private residence Living Arrangements: Children Available Help at Discharge: Family;Available PRN/intermittently (dtr works nearby) Type of Home:  Apartment Home Access: Stairs to enter Entrance Stairs-Rails: Doctor, general practice of Steps: flight   Home Layout: One level Home Equipment: Shower seat;Grab bars - Chartered loss adjuster (2 wheels);Cane - single point Additional Comments: Has borrowed a w/c but does not have leg rest.  Wants to look into getting w/c    Prior Function Prior Level of Function : Needs assist             Mobility Comments: Before she injured back - walked with RW or w/c in apt independently (w/c if gout flair); for stairs does have min A at all times ADLs Comments: Could dress self and toilet (slip on shoes); had supervision in/out of tub but then bathed independently     Extremity/Trunk Assessment   Upper Extremity Assessment Upper Extremity Assessment: Generalized weakness    Lower Extremity Assessment Lower Extremity Assessment: Generalized weakness    Cervical / Trunk Assessment Cervical / Trunk Assessment: Kyphotic (burst L2 fx)  Communication   Communication Communication: No apparent difficulties Cueing Techniques: Verbal cues  Cognition Arousal: Alert Behavior During Therapy: Anxious Overall Cognitive Status: Within Functional Limits for tasks assessed                                          General Comments  NT was checking vitals at arrival. MD had recently removed O2 as pts sats were >92% on 2 L.  On RA sats were 83%.  Rechecked with second pulse ox, sats still 83%.  Reapplied O2 at 2L and sats 96%.  Notified RN.     Exercises     Assessment/Plan    PT Assessment Patient needs continued PT services  PT Problem List Decreased strength;Pain;Decreased range of motion;Cardiopulmonary status limiting activity;Decreased activity tolerance;Decreased balance;Decreased mobility;Decreased knowledge of use of DME;Decreased knowledge of precautions       PT Treatment Interventions DME instruction;Therapeutic exercise;Gait training;Balance  training;Modalities;Functional mobility training;Therapeutic activities;Patient/family education;Wheelchair mobility training    PT Goals (Current goals can be found in the Care Plan section)  Acute Rehab PT Goals Patient Stated Goal: decrease pain PT Goal Formulation: With patient/family Time For Goal Achievement: 05/26/23 Potential to Achieve Goals: Good    Frequency Min 1X/week     Co-evaluation   Reason for Co-Treatment: Complexity of the patient's impairments (multi-system involvement);For patient/therapist safety;To address functional/ADL transfers           AM-PAC PT "6 Clicks" Mobility  Outcome Measure Help needed turning from your back to your side while in a flat bed without using bedrails?: Total Help needed moving from lying on your back to sitting on the side of a flat bed without using bedrails?: Total Help needed moving to and from a bed to a chair (including a wheelchair)?: Total Help needed standing up from a chair using your arms (e.g., wheelchair or bedside chair)?: Total Help needed to walk in hospital room?: Total Help needed climbing 3-5 steps with a railing? : Total 6 Click Score: 6    End  of Session   Activity Tolerance: Patient limited by pain Patient left: in bed;with call bell/phone within reach;with bed alarm set (R sidelying) Nurse Communication: Mobility status PT Visit Diagnosis: Other abnormalities of gait and mobility (R26.89);Muscle weakness (generalized) (M62.81);Pain Pain - part of body:  (back)    Time: 1326-1350 PT Time Calculation (min) (ACUTE ONLY): 24 min   Charges:   PT Evaluation $PT Eval Moderate Complexity: 1 Mod   PT General Charges $$ ACUTE PT VISIT: 1 Visit         Anise Salvo, PT Acute Rehab [facility] [phone]   Rayetta Humphrey 05/12/2023, 3:32 PM

## 2023-05-12 NOTE — Progress Notes (Signed)
Orthopedic Tech Progress Note Patient Details:  Peggy Armstrong [birth year] [id number] LSO has been sized and patient was given instructions on how to apply this brace.  Ortho Devices Type of Ortho Device: Lumbar corsett Ortho Device/Splint Location: Lumbar spine Ortho Device/Splint Interventions: Ordered, Adjustment   Post Interventions Instructions Provided: Adjustment of device  Avrey Hyser E Verenise Moulin 05/12/2023, 10:24 AM

## 2023-05-12 NOTE — Hospital Course (Signed)
Brief hospital course: PMH of HTN, atrial flutter/chronic A-fib on Eliquis, HLD, chronic HFpEF, GERD, DVT 2017. Presents with complaints of 5 days of left hip pain and generalized fatigue. Seen recently by PCP on 11/6 for bilateral leg swelling and was started on Lasix. No fall no trauma reported by the patient.  Assessment and Plan: Subacute L2 burst fracture. Acute insufficiency fracture of bilateral sacral alla Subacute right 10, 11, 12 rib fracture Etiology of the fracture is not clear. Concern for pathological fracture of per report. 6 mm retropulsion seen. ED provider discussed with Dr. Conchita Paris who recommended conservative measures.  No indication to transfer to [facility].  TLSO brace pain control and PT OT. Outpatient follow-up with neurosurgery recommended. Patient with ongoing severe pain right now. MRI reassuring for any cord damage.  But also incidentally shows other subacute or acute fractures.  He  L5-S1 disc protrusion. Possibly causing nerve root irritation on left S1 although currently asymptomatic. Monitor for now.  E. coli UTI. Acute cystitis. Possible frontal sinusitis Receiving IV ceftriaxone. Monitor.  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 Patient sees hematology outpatient for chronic iron deficiency anemia.  Prior workup for myeloma has been negative. Iron transfusion done on at 10/18. Has macrocytosis as well. Platelet count was 144 in October 2024 and hemoglobin was 8.4. Currently platelet count is 99 and hemoglobin 8.8. Iron level actually lower than what it was at the time of October evaluation when she actually received IV iron therapy. Given IV folic acid. Consider IV iron therapy. May require hematology consultation if count continues to trend down further. Transfuse for hemoglobin less than 7. May require to hold Eliquis if the platelet count continues to trend down further.  Hypothyroidism. Continuing Synthroid.  GERD. Continuing Pepcid  and PPI.  Mood disorder. On Zoloft.  BuSpar. Continue.  Chronic A-fib/atrial flutter. Currently rate controlled. On amiodarone, continue. Due to bradycardia I will be holding Cardizem. Also on Eliquis. Will continue the same for now.  HTN. Along with above medication also on chlorthalidone.  Will hold that for now.  Hypokalemia. Being replaced.  Obesity Class 3 Body mass index is 44.78 kg/m.  Placing the pt at higher risk of poor outcomes.  Chronic HFpEF  Volume status is adequate. Holding diuretic.  HLD. On Lipitor, continue.  Jaw pain. Patient reports that the right jaw has been hurting severely limiting her ability to eat and chew. Given that she had multiple fractures which were identified incidentally a CT scan of the maxillofacial area was also recommended to ensure that she does not have any fracture of the area as well as fracture of the head. Reassuringly both CT scans are negative. May require ENT evaluation if the pain remains severe.

## 2023-05-12 NOTE — Progress Notes (Signed)
[facility] Progress Note Patient: Peggy Armstrong ZOX:[id number] DOB: [birth year] DOA: 05/11/2023  DOS: the patient was seen and examined on 05/12/2023  Brief hospital course: PMH of HTN, atrial flutter/chronic A-fib on Eliquis, HLD, chronic HFpEF, GERD, DVT 2017. Presents with complaints of 5 days of left hip pain and generalized fatigue. Seen recently by PCP on 11/6 for bilateral leg swelling and was started on Lasix. No fall no trauma reported by the patient.  Assessment and Plan: L2 burst fracture. Etiology of the fracture is not clear. Concern for pathological fracture of per report. 6 mm retropulsion seen. ED provider discussed with Dr. Conchita Paris who recommended conservative measures.  No indication to transfer to [facility].  TLSO brace pain control and PT OT. Outpatient follow-up with neurosurgery recommended. Patient with ongoing severe pain right now. Will modify pain regimen and monitor for improvement. Given her report of intermittent numbness while changing her position I will check an MRI to rule out other abnormality.  UTI. Acute cystitis. Receiving IV ceftriaxone. Monitor.  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 Patient sees hematology outpatient for chronic iron deficiency anemia.  Prior workup for myeloma has been negative. Iron transfusion done on at 10/18. Has macrocytosis as well. Platelet count was 144 in October 2024 and hemoglobin was 8.4. Currently platelet count is 99 and hemoglobin 8.8. Iron level actually lower than what it was at the time of October evaluation when she actually received IV iron therapy. Will provide IV folic acid. Consider IV iron therapy. May require hematology consultation if related count continues to trend down further. Transfuse for hemoglobin less than 7. May require to hold Eliquis if the platelet count continues to trend down further.  Hypothyroidism. Continuing Synthroid.  GERD. Continuing Pepcid and PPI.  Mood  disorder. On Zoloft.  BuSpar. Continue.  Chronic A-fib/atrial flutter. Currently rate controlled. On amiodarone, Cardizem. Will continue the same for now. Also on Eliquis. Will continue the same for now.  HTN. Along with above medication also on chlorthalidone.  Will hold that for now.  Hypokalemia. Being replaced.  Obesity Class 3 Body mass index is 44.78 kg/m.  Placing the pt at higher risk of poor outcomes.  Chronic HFpEF  Volume status is adequate. Holding diuretic.  HLD. On Lipitor, continue.   Subjective: Reports back pain.  Reports intermittent numbness of the right leg.  No nausea no vomiting no fever no chills.  Pain controlled with medication but not adequately.  Physical Exam: General: in severe distress, No Rash Cardiovascular: S1 and S2 Present, No Murmur Respiratory: Good respiratory effort, Bilateral Air entry present. No Crackles, No wheezes Abdomen: Bowel Sound present, No tenderness Extremities: No edema Neuro: Alert and oriented x3, no new focal deficit, sensation equal to light touch bilaterally.  Proprioception is actually normal.  Equal bilateral strength in both upper and lower extremity seen as well.  Babies get difficult to be elicited but withdraws to painful stimuli bilaterally.  Data Reviewed: I have Reviewed nursing notes, Vitals, and Lab results. Since last encounter, pertinent lab results CBC and BMP   . I have ordered test including CBC and BMP  . I have ordered imaging MRI lumbar spine  .   Disposition: Status is: Inpatient Remains inpatient appropriate because: Monitor for improvement in pain control.  Will require SNF.  SCDs Start: 05/11/23 2326 apixaban (ELIQUIS) tablet 5 mg   Family Communication: Family at bedside Level of care: Telemetry   Vitals:   05/12/23 1325 05/12/23 1334 05/12/23 1340 05/12/23 1423  BP: (!) 128/102  (!) 116/59 (!) 144/129  Pulse: 65  67 67  Resp: 20   17  Temp: 99.3 F (37.4 C)  98.4 F (36.9 C)  98.7 F (37.1 C)  TempSrc: Oral  Oral Oral  SpO2: (!) 83% 96%  98%  Weight:      Height:         Author: Lynden Oxford, MD 05/12/2023 6:03 PM  Please look on [email or web address] to find out who is on call.

## 2023-05-12 NOTE — Evaluation (Signed)
Occupational Therapy Evaluation Patient Details Name: Peggy Armstrong MRN: [id number] DOB: 1946/06/20 Today's Date: 05/12/2023   History of Present Illness Patient is a 77 yo female presenting to the ED for L knee and hip pain and minimal low back pain for the last 5 days on 05/11/23. Found to have burst L2 fracture with CT. PMH includes: essential pretension, paroxysmal atrial fibrillation chronically anticoagulated on Eliquis, hyperlipidemia, chronic diastolic heart failure   Clinical Impression   Prior to this admission, patient ambulatory with RW or using w/c when gout flared up. Patient could complete ADLs, but had supervision to shower. Patient presenting with significant back pain, and unable to tolerate sitting EOB. Patient with need for total A of 2 for bed mobility, and unable to don brace or proceed forward with OT/PT due to pain. Given prior level and current needs, OT recommending lesser intensive therapy setting prior to discharge home. OT will follow acutely.    If plan is discharge home, recommend the following: Two people to help with walking and/or transfers;Two people to help with bathing/dressing/bathroom;Assist for transportation;Help with stairs or ramp for entrance;Assistance with cooking/housework    Functional Status Assessment  Patient has had a recent decline in their functional status and demonstrates the ability to make significant improvements in function in a reasonable and predictable amount of time.  Equipment Recommendations  Other (comment) (defer to next venue)    Recommendations for Other Services       Precautions / Restrictions Precautions Precautions: Fall;Back Precaution Booklet Issued: Yes (comment) Precaution Comments: reviewed and provided handout Required Braces or Orthoses: Spinal Brace Spinal Brace: Thoracolumbosacral orthotic;Applied in sitting position Restrictions Weight Bearing Restrictions: No      Mobility Bed Mobility Overal  bed mobility: Needs Assistance Bed Mobility: Rolling, Sidelying to Sit, Sit to Supine Rolling: Total assist, +2 for physical assistance, +2 for safety/equipment Sidelying to sit: Total assist, +2 for physical assistance, +2 for safety/equipment   Sit to supine: Total assist, +2 for physical assistance, +2 for safety/equipment   General bed mobility comments: Patient requiring significant assist to roll, unable to bring legs off of bed without total A, and then total A to elevate trunk and move legs off of bed. Patient yelling stating, "its too much, I cant do this" and then returned to supine with total A of 2    Transfers Overall transfer level: Needs assistance                 General transfer comment: unable to transition due to pain      Balance Overall balance assessment: Needs assistance Sitting-balance support: Bilateral upper extremity supported, Feet supported Sitting balance-Leahy Scale: Fair Sitting balance - Comments: significant bracing sitting EOB                                   ADL either performed or assessed with clinical judgement   ADL Overall ADL's : Needs assistance/impaired Eating/Feeding: Set up;Bed level   Grooming: Set up;Bed level   Upper Body Bathing: Minimal assistance;Bed level   Lower Body Bathing: Total assistance;Bed level   Upper Body Dressing : Minimal assistance;Bed level   Lower Body Dressing: Total assistance;Bed level   Toilet Transfer: Total assistance;+2 for physical assistance;+2 for safety/equipment Toilet Transfer Details (indicate cue type and reason): unable to maintain sitting position EOB, total A of 2 Toileting- Clothing Manipulation and Hygiene: Total assistance;+2 for physical assistance;+2 for safety/equipment;Bed  level       Functional mobility during ADLs: Total assistance;+2 for physical assistance;+2 for safety/equipment General ADL Comments: Patient presenting with significant back pain, and  unable to tolerate sitting EOB. Patient with need for total A of 2 for bed mobility, and unable to don brace or proceed forward with OT/PT due to pain. Given prior level and current needs, OT recommending lesser intensive therapy setting prior to discharge home. OT will follow acutely.     Vision Ability to See in Adequate Light: 0 Adequate Patient Visual Report: No change from baseline Vision Assessment?: No apparent visual deficits     Perception Perception: Not tested       Praxis Praxis: Not tested       Pertinent Vitals/Pain Pain Assessment Pain Assessment: Faces Faces Pain Scale: Hurts worst Pain Location: Low back Pain Descriptors / Indicators: Discomfort, Grimacing, Guarding Pain Intervention(s): Limited activity within patient's tolerance, Monitored during session, Repositioned     Extremity/Trunk Assessment Upper Extremity Assessment Upper Extremity Assessment: Generalized weakness   Lower Extremity Assessment Lower Extremity Assessment: Defer to PT evaluation   Cervical / Trunk Assessment Cervical / Trunk Assessment: Kyphotic;Other exceptions (Burst L2)   Communication Communication Communication: No apparent difficulties Cueing Techniques: Verbal cues   Cognition Arousal: Alert Behavior During Therapy: Anxious Overall Cognitive Status: Within Functional Limits for tasks assessed                                       General Comments       Exercises     Shoulder Instructions      Home Living Family/patient expects to be discharged to:: Private residence Living Arrangements: Children Available Help at Discharge: Family;Available PRN/intermittently (dtr nearby when at work if needed) Type of Home: Apartment Home Access: Stairs to enter [employer] of Steps: flight Entrance Stairs-Rails: Right;Left Home Layout: One level     Bathroom Shower/Tub: Tub/shower unit         Home Equipment: Shower seat;Grab bars -  tub/shower;Rolling Environmental consultant (2 wheels);Cane - single point   Additional Comments: Has borrowed a w/c but does not have leg rest.  Wants to look into getting w/c      Prior Functioning/Environment Prior Level of Function : Needs assist             Mobility Comments: Before she injured back - walked with RW or w/c in apt independently (w/c if gout flair); for stairs does have min A at all times ADLs Comments: Could dress self and toilet (slip on shoes); had supervision in/out of tub but then bathed independently        OT Problem List: Decreased strength;Decreased range of motion;Decreased activity tolerance;Impaired balance (sitting and/or standing);Decreased coordination;Decreased safety awareness;Decreased knowledge of use of DME or AE;Decreased knowledge of precautions;Pain      OT Treatment/Interventions: Self-care/ADL training;Therapeutic exercise;Energy conservation;DME and/or AE instruction;Manual therapy;Therapeutic activities;Patient/family education;Balance training    OT Goals(Current goals can be found in the care plan section) Acute Rehab OT Goals Patient Stated Goal: to be in less pain OT Goal Formulation: With patient Time For Goal Achievement: 05/26/23 Potential to Achieve Goals: Fair  OT Frequency: Min 1X/week    Co-evaluation   Reason for Co-Treatment: Complexity of the patient's impairments (multi-system involvement);For patient/therapist safety;To address functional/ADL transfers          AM-PAC OT "6 Clicks" Daily Activity     Outcome  Measure Help from another person eating meals?: A Little Help from another person taking care of personal grooming?: A Little Help from another person toileting, which includes using toliet, bedpan, or urinal?: Total Help from another person bathing (including washing, rinsing, drying)?: A Lot Help from another person to put on and taking off regular upper body clothing?: A Little Help from another person to put on and  taking off regular lower body clothing?: Total 6 Click Score: 13   End of Session Equipment Utilized During Treatment: Back brace Nurse Communication: Mobility status  Activity Tolerance: Patient limited by pain Patient left: in bed;with call bell/phone within reach;with bed alarm set;with family/visitor present  OT Visit Diagnosis: Unsteadiness on feet (R26.81);Other abnormalities of gait and mobility (R26.89);Repeated falls (R29.6);Muscle weakness (generalized) (M62.81);Pain Pain - part of body:  (low back)                Time: 1326-1350 OT Time Calculation (min): 24 min Charges:  OT General Charges $OT Visit: 1 Visit OT Evaluation $OT Eval Moderate Complexity: 1 Mod  Pollyann Glen E. Idania Desouza, OTR/L Acute Rehabilitation Services [phone]   Cherlyn Cushing 05/12/2023, 3:16 PM

## 2023-05-12 NOTE — Plan of Care (Signed)

## 2023-05-13 ENCOUNTER — Inpatient Hospital Stay (HOSPITAL_COMMUNITY): Payer: 59

## 2023-05-13 DIAGNOSIS — S32020A Wedge compression fracture of second lumbar vertebra, initial encounter for closed fracture: Secondary | ICD-10-CM | POA: Diagnosis not present

## 2023-05-13 MED ORDER — ENSURE ENLIVE PO LIQD
237.0000 mL | Freq: Three times a day (TID) | ORAL | Status: DC
  Administered 2023-05-13 – 2023-05-18 (×12): 237 mL via ORAL

## 2023-05-13 MED ORDER — CARMEX CLASSIC LIP BALM EX OINT
TOPICAL_OINTMENT | CUTANEOUS | Status: DC | PRN
  Filled 2023-05-13: qty 10

## 2023-05-13 NOTE — Progress Notes (Signed)
[facility] Progress Note Patient: Peggy Armstrong VOZ:[id number] DOB: 01/01/1946 DOA: 05/11/2023  DOS: the patient was seen and examined on 05/13/2023  Brief hospital course: PMH of HTN, atrial flutter/chronic A-fib on Eliquis, HLD, chronic HFpEF, GERD, DVT 2017. Presents with complaints of 5 days of left hip pain and generalized fatigue. Seen recently by PCP on 11/6 for bilateral leg swelling and was started on Lasix. No fall no trauma reported by the patient.  Assessment and Plan: Subacute L2 burst fracture. Acute insufficiency fracture of bilateral sacral alla Subacute right 10, 11, 12 rib fracture Etiology of the fracture is not clear. Concern for pathological fracture of per report. 6 mm retropulsion seen. ED provider discussed with Dr. Conchita Paris who recommended conservative measures.  No indication to transfer to [facility].  TLSO brace pain control and PT OT. Outpatient follow-up with neurosurgery recommended. Patient with ongoing severe pain right now. MRI reassuring for any cord damage.  But also incidentally shows other subacute or acute fractures.  He  L5-S1 disc protrusion. Possibly causing nerve root irritation on left S1 although currently asymptomatic. Monitor for now.  E. coli UTI. Acute cystitis. Possible frontal sinusitis Receiving IV ceftriaxone. Monitor.  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 Patient sees hematology outpatient for chronic iron deficiency anemia.  Prior workup for myeloma has been negative. Iron transfusion done on at 10/18. Has macrocytosis as well. Platelet count was 144 in October 2024 and hemoglobin was 8.4. Currently platelet count is 99 and hemoglobin 8.8. Iron level actually lower than what it was at the time of October evaluation when she actually received IV iron therapy. Given IV folic acid. Consider IV iron therapy. May require hematology consultation if count continues to trend down further. Transfuse for hemoglobin  less than 7. May require to hold Eliquis if the platelet count continues to trend down further.  Hypothyroidism. Continuing Synthroid.  GERD. Continuing Pepcid and PPI.  Mood disorder. On Zoloft.  BuSpar. Continue.  Chronic A-fib/atrial flutter. Currently rate controlled. On amiodarone, continue. Due to bradycardia I will be holding Cardizem. Also on Eliquis. Will continue the same for now.  HTN. Along with above medication also on chlorthalidone.  Will hold that for now.  Hypokalemia. Being replaced.  Obesity Class 3 Body mass index is 44.78 kg/m.  Placing the pt at higher risk of poor outcomes.  Chronic HFpEF  Volume status is adequate. Holding diuretic.  HLD. On Lipitor, continue.  Jaw pain. Patient reports that the right jaw has been hurting severely limiting her ability to eat and chew. Given that she had multiple fractures which were identified incidentally a CT scan of the maxillofacial area was also recommended to ensure that she does not have any fracture of the area as well as fracture of the head. Reassuringly both CT scans are negative. May require ENT evaluation if the pain remains severe.   Subjective: No nausea no vomiting.  Reports jaw pain.  No fever no chills.  Passing gas.  Physical Exam: General: in moderate distress, No Rash Cardiovascular: S1 and S2 Present, No Murmur Respiratory: Good respiratory effort, Bilateral Air entry present. No Crackles, No wheezes Abdomen: Bowel Sound present, No tenderness Extremities: Bilateral edema Neuro: Alert and oriented x3, no new focal deficit  Data Reviewed: I have Reviewed nursing notes, Vitals, and Lab results. Since last encounter, pertinent lab results CBC and BMP   . I have ordered test including CBC and BMP  .  Ordered CT head as well as CT maxillofacial.  Disposition:  Status is: Inpatient Remains inpatient appropriate because: Need for pain control.  SCDs Start: 05/11/23 2326 apixaban  (ELIQUIS) tablet 5 mg   Family Communication: Discussed with daughter on the phone Level of care: Telemetry   Vitals:   05/12/23 2000 05/13/23 0500 05/13/23 0632 05/13/23 1326  BP: 116/67  (!) 140/56 (!) 99/57  Pulse: (!) 59  (!) 59 (!) 56  Resp: 18  20 16   Temp: (!) 97.3 F (36.3 C)  98.5 F (36.9 C) 98.5 F (36.9 C)  TempSrc: Oral  Oral   SpO2: 100%  100% 100%  Weight:  76.7 kg    Height:         Author: Lynden Oxford, MD 05/13/2023 6:19 PM  Please look on [email or web address] to find out who is on call.

## 2023-05-13 NOTE — Plan of Care (Signed)
  Problem: Education: Goal: Knowledge of General Education information will improve Description: Including pain rating scale, medication(s)/side effects and non-pharmacologic comfort measures Outcome: Progressing   Problem: Health Behavior/Discharge Planning: Goal: Ability to manage health-related needs will improve Outcome: Progressing   Problem: Clinical Measurements: Goal: Will remain free from infection Outcome: Progressing Goal: Diagnostic test results will improve Outcome: Progressing Goal: Respiratory complications will improve Outcome: Progressing   Problem: Activity: Goal: Risk for activity intolerance will decrease Outcome: Progressing   Problem: Nutrition: Goal: Adequate nutrition will be maintained Outcome: Progressing   Problem: Elimination: Goal: Will not experience complications related to bowel motility Outcome: Progressing Goal: Will not experience complications related to urinary retention Outcome: Progressing   Problem: Pain Management: Goal: General experience of comfort will improve Outcome: Progressing   Problem: Safety: Goal: Ability to remain free from injury will improve Outcome: Progressing   Problem: Skin Integrity: Goal: Risk for impaired skin integrity will decrease Outcome: Progressing   Problem: Activity: Goal: Ability to tolerate increased activity will improve Outcome: Progressing   Problem: Cardiac: Goal: Ability to achieve and maintain adequate cardiopulmonary perfusion will improve Outcome: Progressing

## 2023-05-13 NOTE — Plan of Care (Signed)
  Problem: Education: Goal: Knowledge of General Education information will improve Description: Including pain rating scale, medication(s)/side effects and non-pharmacologic comfort measures Outcome: Progressing   Problem: Health Behavior/Discharge Planning: Goal: Ability to manage health-related needs will improve Outcome: Progressing   Problem: Clinical Measurements: Goal: Ability to maintain clinical measurements within normal limits will improve Outcome: Progressing Goal: Will remain free from infection Outcome: Progressing Goal: Diagnostic test results will improve Outcome: Progressing Goal: Respiratory complications will improve Outcome: Progressing Goal: Cardiovascular complication will be avoided Outcome: Progressing   Problem: Activity: Goal: Risk for activity intolerance will decrease Outcome: Progressing   Problem: Nutrition: Goal: Adequate nutrition will be maintained Outcome: Progressing   Problem: Coping: Goal: Level of anxiety will decrease Outcome: Progressing   Problem: Elimination: Goal: Will not experience complications related to bowel motility Outcome: Progressing Goal: Will not experience complications related to urinary retention Outcome: Progressing   Problem: Pain Management: Goal: General experience of comfort will improve Outcome: Progressing   Problem: Safety: Goal: Ability to remain free from injury will improve Outcome: Progressing   Problem: Skin Integrity: Goal: Risk for impaired skin integrity will decrease Outcome: Progressing   Problem: Activity: Goal: Ability to tolerate increased activity will improve Outcome: Progressing   Problem: Cardiac: Goal: Ability to achieve and maintain adequate cardiopulmonary perfusion will improve Outcome: Progressing

## 2023-05-14 DIAGNOSIS — S32020A Wedge compression fracture of second lumbar vertebra, initial encounter for closed fracture: Secondary | ICD-10-CM | POA: Diagnosis not present

## 2023-05-14 LAB — BASIC METABOLIC PANEL
Anion gap: 6 (ref 5–15)
BUN: 28 mg/dL — ABNORMAL HIGH (ref 8–23)
CO2: 28 mmol/L (ref 22–32)
Calcium: 8.5 mg/dL — ABNORMAL LOW (ref 8.9–10.3)
Chloride: 104 mmol/L (ref 98–111)
Creatinine, Ser: 0.94 mg/dL (ref 0.44–1.00)
GFR, Estimated: >60 mL/min (ref >60)
Glucose, Bld: 101 mg/dL — ABNORMAL HIGH (ref 70–99)
Potassium: 3.9 mmol/L (ref 3.5–5.1)
Sodium: 138 mmol/L (ref 135–145)

## 2023-05-14 LAB — CBC
HCT: 26.6 % — ABNORMAL LOW (ref 36.0–46.0)
HCT: 25.7 % — ABNORMAL LOW (ref 36.0–46.0)
HCT: 30.1 % — ABNORMAL LOW (ref 36.0–46.0)
HCT: 27.7 % — ABNORMAL LOW (ref 36.0–46.0)
Hemoglobin: 7.8 g/dL — ABNORMAL LOW (ref 12.0–15.0)
Hemoglobin: 7.3 g/dL — ABNORMAL LOW (ref 12.0–15.0)
Hemoglobin: 8.7 g/dL — ABNORMAL LOW (ref 12.0–15.0)
Hemoglobin: 8.1 g/dL — ABNORMAL LOW (ref 12.0–15.0)
MCH: 31.5 pg (ref 26.0–34.0)
MCH: 31.3 pg (ref 26.0–34.0)
MCH: 31.4 pg (ref 26.0–34.0)
MCH: 31 pg (ref 26.0–34.0)
MCHC: 29.3 g/dL — ABNORMAL LOW (ref 30.0–36.0)
MCHC: 28.4 g/dL — ABNORMAL LOW (ref 30.0–36.0)
MCHC: 28.9 g/dL — ABNORMAL LOW (ref 30.0–36.0)
MCHC: 29.2 g/dL — ABNORMAL LOW (ref 30.0–36.0)
MCV: 107.3 fL — ABNORMAL HIGH (ref 80.0–100.0)
MCV: 110.3 fL — ABNORMAL HIGH (ref 80.0–100.0)
MCV: 108.7 fL — ABNORMAL HIGH (ref 80.0–100.0)
MCV: 106.1 fL — ABNORMAL HIGH (ref 80.0–100.0)
Platelets: 118 10*3/uL — ABNORMAL LOW (ref 150–400)
Platelets: 135 10*3/uL — ABNORMAL LOW (ref 150–400)
Platelets: 136 10*3/uL — ABNORMAL LOW (ref 150–400)
Platelets: 140 10*3/uL — ABNORMAL LOW (ref 150–400)
RBC: 2.48 MIL/uL — ABNORMAL LOW (ref 3.87–5.11)
RBC: 2.33 MIL/uL — ABNORMAL LOW (ref 3.87–5.11)
RBC: 2.77 MIL/uL — ABNORMAL LOW (ref 3.87–5.11)
RBC: 2.61 MIL/uL — ABNORMAL LOW (ref 3.87–5.11)
RDW: 15.6 % — ABNORMAL HIGH (ref 11.5–15.5)
RDW: 15.4 % (ref 11.5–15.5)
RDW: 15.3 % (ref 11.5–15.5)
RDW: 15.4 % (ref 11.5–15.5)
WBC: 5.1 10*3/uL (ref 4.0–10.5)
WBC: 5.8 10*3/uL (ref 4.0–10.5)
WBC: 6.5 10*3/uL (ref 4.0–10.5)
WBC: 6.1 10*3/uL (ref 4.0–10.5)
nRBC: 0 % (ref 0.0–0.2)
nRBC: 0 % (ref 0.0–0.2)
nRBC: 0 % (ref 0.0–0.2)
nRBC: 0 % (ref 0.0–0.2)

## 2023-05-14 LAB — HEPATIC FUNCTION PANEL
ALT: 49 U/L — ABNORMAL HIGH (ref 0–44)
AST: 58 U/L — ABNORMAL HIGH (ref 15–41)
Albumin: 2.4 g/dL — ABNORMAL LOW (ref 3.5–5.0)
Alkaline Phosphatase: 107 U/L (ref 38–126)
Bilirubin, Direct: 0.1 mg/dL (ref 0.0–0.2)
Indirect Bilirubin: 0.3 mg/dL (ref 0.3–0.9)
Total Bilirubin: 0.4 mg/dL (ref <1.2)
Total Protein: 5.2 g/dL — ABNORMAL LOW (ref 6.5–8.1)

## 2023-05-14 LAB — CULTURE, OB URINE: Culture: >=100000 — AB

## 2023-05-14 LAB — PROTIME-INR
INR: 1.8 — ABNORMAL HIGH (ref 0.8–1.2)
Prothrombin Time: 20.8 s — ABNORMAL HIGH (ref 11.4–15.2)

## 2023-05-14 LAB — TYPE AND SCREEN
ABO/RH(D): O POS
Antibody Screen: NEGATIVE

## 2023-05-14 LAB — RETICULOCYTES
Immature Retic Fract: 16.4 % — ABNORMAL HIGH (ref 2.3–15.9)
RBC.: 2.53 MIL/uL — ABNORMAL LOW (ref 3.87–5.11)
Retic Count, Absolute: 65 10*3/uL (ref 19.0–186.0)
Retic Ct Pct: 2.6 % (ref 0.4–3.1)

## 2023-05-14 LAB — ABO/RH: ABO/RH(D): O POS

## 2023-05-14 LAB — OCCULT BLOOD X 1 CARD TO LAB, STOOL: Fecal Occult Bld: POSITIVE — AB

## 2023-05-14 LAB — APTT: aPTT: 44 s — ABNORMAL HIGH (ref 24–36)

## 2023-05-14 LAB — LACTATE DEHYDROGENASE: LDH: 102 U/L (ref 98–192)

## 2023-05-14 LAB — MAGNESIUM: Magnesium: 1.7 mg/dL (ref 1.7–2.4)

## 2023-05-14 MED ORDER — PANTOPRAZOLE SODIUM 40 MG IV SOLR
40.0000 mg | INTRAVENOUS | Status: AC
  Administered 2023-05-14 (×2): 40 mg via INTRAVENOUS
  Filled 2023-05-14: qty 10

## 2023-05-14 MED ORDER — FUROSEMIDE 20 MG PO TABS
20.0000 mg | ORAL_TABLET | Freq: Every day | ORAL | Status: DC
  Administered 2023-05-14: 20 mg via ORAL
  Filled 2023-05-14: qty 1

## 2023-05-14 MED ORDER — PANTOPRAZOLE SODIUM 40 MG IV SOLR
40.0000 mg | Freq: Two times a day (BID) | INTRAVENOUS | Status: DC
Start: 2023-05-15 — End: 2023-05-17
  Administered 2023-05-15 – 2023-05-17 (×5): 40 mg via INTRAVENOUS
  Filled 2023-05-14 (×5): qty 10

## 2023-05-14 MED ORDER — CEFADROXIL 500 MG PO CAPS
500.0000 mg | ORAL_CAPSULE | Freq: Two times a day (BID) | ORAL | Status: DC
  Administered 2023-05-14 – 2023-05-18 (×9): 500 mg via ORAL
  Filled 2023-05-14 (×9): qty 1

## 2023-05-14 MED ORDER — LEVOTHYROXINE SODIUM 88 MCG PO TABS
88.0000 ug | ORAL_TABLET | Freq: Every day | ORAL | Status: DC
  Administered 2023-05-15 – 2023-05-18 (×4): 88 ug via ORAL
  Filled 2023-05-14 (×4): qty 1

## 2023-05-14 MED ORDER — ALBUTEROL SULFATE (2.5 MG/3ML) 0.083% IN NEBU: 2.5000 mg | INHALATION_SOLUTION | RESPIRATORY_TRACT | Status: DC | PRN

## 2023-05-14 MED ORDER — TIZANIDINE HCL 2 MG PO TABS
1.0000 mg | ORAL_TABLET | Freq: Three times a day (TID) | ORAL | Status: DC
  Administered 2023-05-14 – 2023-05-17 (×10): 1 mg via ORAL
  Filled 2023-05-14 (×2): qty 1
  Filled 2023-05-14 (×3): qty 0.5
  Filled 2023-05-14 (×4): qty 1
  Filled 2023-05-14: qty 0.5
  Filled 2023-05-14 (×2): qty 1
  Filled 2023-05-14: qty 0.5
  Filled 2023-05-14: qty 1
  Filled 2023-05-14: qty 0.5

## 2023-05-14 NOTE — Plan of Care (Signed)
  Problem: Education: Goal: Knowledge of General Education information will improve Description: Including pain rating scale, medication(s)/side effects and non-pharmacologic comfort measures Outcome: Progressing   Problem: Health Behavior/Discharge Planning: Goal: Ability to manage health-related needs will improve Outcome: Progressing   Problem: Clinical Measurements: Goal: Will remain free from infection Outcome: Progressing Goal: Respiratory complications will improve Outcome: Progressing   Problem: Nutrition: Goal: Adequate nutrition will be maintained Outcome: Progressing   Problem: Coping: Goal: Level of anxiety will decrease Outcome: Progressing   Problem: Elimination: Goal: Will not experience complications related to bowel motility Outcome: Progressing Goal: Will not experience complications related to urinary retention Outcome: Progressing   Problem: Pain Management: Goal: General experience of comfort will improve Outcome: Progressing   Problem: Safety: Goal: Ability to remain free from injury will improve Outcome: Progressing   Problem: Skin Integrity: Goal: Risk for impaired skin integrity will decrease Outcome: Progressing   Problem: Cardiac: Goal: Ability to achieve and maintain adequate cardiopulmonary perfusion will improve Outcome: Progressing

## 2023-05-14 NOTE — [place] FL2 (Signed)
[place] MEDICAID FL2 LEVEL OF CARE FORM     IDENTIFICATION  Patient Name: Peggy Armstrong Birthdate: 1945/12/22 Sex: female Admission Date (Current Location): 05/11/2023  [facility] and [place] Number:  Producer, television/film/video and Address:  [facility],  [address] [place], [place] [address]      Provider Number: [id number]  Attending Physician Name and Address:  Rolly Salter, MD  Relative Name and Phone Number:  Stahly,Cindy Daughter [phone]  [phone]    Current Level of Care: Hospital Recommended Level of Care: Skilled Nursing Facility Prior Approval Number:    Date Approved/Denied:   PASRR Number: [id number]  Discharge Plan: SNF    Current Diagnoses: Patient Active Problem List   Diagnosis Date Noted   Generalized weakness 05/12/2023   Acute cystitis 05/12/2023   Dehydration 05/12/2023   Acute prerenal azotemia 05/12/2023   Paroxysmal atrial fibrillation (HCC) 05/12/2023   AAA (abdominal aortic aneurysm) (HCC) 05/12/2023   Hyperlipidemia 05/12/2023   GAD (generalized anxiety disorder) 05/12/2023   Acquired hypothyroidism 05/12/2023   Closed compression fracture of L2 lumbar vertebra, initial encounter (HCC) 05/11/2023   Chronic diastolic CHF (congestive heart failure) (HCC) 11/08/2019   Morbid obesity (HCC) 11/08/2019   History of DVT (deep vein thrombosis) 11/08/2019   Right shoulder pain 07/26/2018   Duodenal ulcer    Hematemesis 11/05/2017   Macrocytic anemia 11/05/2017   Melena 11/05/2017   Nausea & vomiting    Hypokalemia 07/03/2017   Essential hypertension    Gout    Clotting disorder (HCC)    GERD (gastroesophageal reflux disease)    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   CKD (chronic kidney disease), stage III (HCC) 06/09/2017   Peri-ampullary neoplasm    Atypical atrial flutter (HCC)    Duodenal mass    Cholangitis    Epigastric pain    Choledocholithiasis 03/22/2017   SIRS (systemic inflammatory response  syndrome) (HCC) 03/22/2017   Poor dentition 08/14/2016   Iron deficiency anemia 08/02/2016   RA (rheumatoid arthritis) (HCC) 09/07/2015    Orientation RESPIRATION BLADDER Height & Weight     Self, Time, Situation, Place  O2 (2L) Incontinent Weight: 196 lb 10.4 oz (89.2 kg) Height:  5' (152.4 cm)  BEHAVIORAL SYMPTOMS/MOOD NEUROLOGICAL BOWEL NUTRITION STATUS      Continent Diet (Thin Clear Liquid diet)  AMBULATORY STATUS COMMUNICATION OF NEEDS Skin   Limited Assist Verbally Normal                       Personal Care Assistance Level of Assistance  Bathing, Feeding, Dressing Bathing Assistance: Limited assistance Feeding assistance: Independent Dressing Assistance: Limited assistance     Functional Limitations Info  Sight, Hearing, Speech Sight Info: Adequate Hearing Info: Adequate Speech Info: Adequate    SPECIAL CARE FACTORS FREQUENCY  PT (By licensed PT), OT (By licensed OT)     PT Frequency: Minimum 5x a week OT Frequency: Minimum 5x a week            Contractures Contractures Info: Not present    Additional Factors Info  Code Status, Allergies, Psychotropic Code Status Info: Full Code Allergies Info: Codeine Psychotropic Info: busPIRone (BUSPAR) tablet 10 mg         Current Medications (05/14/2023):  This is the current hospital active medication list Current Facility-Administered Medications  Medication Dose Route Frequency Provider Last Rate Last Admin   acetaminophen (TYLENOL) tablet 650 mg  650 mg Oral Q12H PRN Rolly Salter,  MD       Or   acetaminophen (TYLENOL) suppository 650 mg  650 mg Rectal Q12H PRN Rolly Salter, MD       acetaminophen (TYLENOL) tablet 1,000 mg  1,000 mg Oral TID Rolly Salter, MD   1,000 mg at 05/14/23 1633   albuterol (PROVENTIL) (2.5 MG/3ML) 0.083% nebulizer solution 2.5 mg  2.5 mg Nebulization Q4H PRN Rolly Salter, MD       amiodarone (PACERONE) tablet 200 mg  200 mg Oral Daily Howerter, Justin B, DO   200 mg  at 05/14/23 1032   atorvastatin (LIPITOR) tablet 20 mg  20 mg Oral Daily Howerter, Justin B, DO   20 mg at 05/14/23 1032   busPIRone (BUSPAR) tablet 10 mg  10 mg Oral BID Howerter, Justin B, DO   10 mg at 05/14/23 1033   cefadroxil (DURICEF) capsule 500 mg  500 mg Oral BID Rolly Salter, MD   500 mg at 05/14/23 1344   famotidine (PEPCID) tablet 20 mg  20 mg Oral QPM Howerter, Justin B, DO   20 mg at 05/14/23 1743   feeding supplement (ENSURE ENLIVE / ENSURE PLUS) liquid 237 mL  237 mL Oral TID BM Rolly Salter, MD   237 mL at 05/14/23 1345   ferrous sulfate tablet 325 mg  325 mg Oral Q breakfast Howerter, Justin B, DO   325 mg at 05/14/23 0749   folic acid (FOLVITE) tablet 1 mg  1 mg Oral Daily Rolly Salter, MD   1 mg at 05/14/23 1033   furosemide (LASIX) tablet 20 mg  20 mg Oral Daily Rolly Salter, MD   20 mg at 05/14/23 1743   [START ON 05/15/2023] levothyroxine (SYNTHROID) tablet 88 mcg  88 mcg Oral Q0600 Rolly Salter, MD       lidocaine (LIDODERM) 5 % 2 patch  2 patch Transdermal Q24H Rolly Salter, MD   2 patch at 05/14/23 1345   lip balm (CARMEX) ointment   Topical PRN Rolly Salter, MD       melatonin tablet 3 mg  3 mg Oral QHS PRN Howerter, Justin B, DO       morphine (PF) 2 MG/ML injection 2 mg  2 mg Intravenous Q2H PRN Rolly Salter, MD   2 mg at 05/14/23 0816   naloxone [facility]) injection 0.4 mg  0.4 mg Intravenous PRN Howerter, Justin B, DO       ondansetron (ZOFRAN) injection 4 mg  4 mg Intravenous Q6H PRN Howerter, Justin B, DO       Oral care mouth rinse  15 mL Mouth Rinse PRN Howerter, Justin B, DO       oxyCODONE (Oxy IR/ROXICODONE) immediate release tablet 5 mg  5 mg Oral Q4H PRN Rolly Salter, MD   5 mg at 05/14/23 0611   pantoprazole (PROTONIX) injection 40 mg  40 mg Intravenous Q5 min Rolly Salter, MD       Followed by   Melene Muller ON 05/15/2023] pantoprazole (PROTONIX) injection 40 mg  40 mg Intravenous Q12H Rolly Salter, MD       senna-docusate  (Senokot-S) tablet 1 tablet  1 tablet Oral BID Rolly Salter, MD   1 tablet at 05/14/23 1032   sertraline (ZOLOFT) tablet 25 mg  25 mg Oral Daily Howerter, Justin B, DO   25 mg at 05/14/23 1033   tiZANidine (ZANAFLEX) tablet 1 mg  1 mg Oral TID Allena Katz,  Zachery Conch, MD         Discharge Medications: Please see discharge summary for a list of discharge medications.  Relevant Imaging Results:  Relevant Lab Results:   Additional Information SSN [id number]  Darleene Cleaver, LCSW

## 2023-05-14 NOTE — Plan of Care (Signed)

## 2023-05-14 NOTE — Progress Notes (Signed)
[facility] Progress Note Patient: Peggy Armstrong LKG:[id number] DOB: 1945/08/08 DOA: 05/11/2023  DOS: the patient was seen and examined on 05/14/2023  Brief hospital course: PMH of HTN, atrial flutter/chronic A-fib on Eliquis, HLD, chronic HFpEF, GERD, DVT 2017. Presents with complaints of 5 days of left hip pain and generalized fatigue. Seen recently by PCP on 11/6 for bilateral leg swelling and was started on Lasix. No fall no trauma reported by the patient.  Assessment and Plan: Subacute L2 burst fracture. Acute insufficiency fracture of bilateral sacral alla Subacute right 10, 11, 12 rib fracture Etiology of the fracture is not clear. Concern for pathological fracture of per report. 6 mm retropulsion seen. ED provider discussed with Dr. Conchita Paris who recommended conservative measures.  No indication to transfer to [facility].  TLSO brace pain control and PT OT. Outpatient follow-up with neurosurgery recommended. Patient with ongoing severe pain right now. MRI reassuring for any cord damage.  But also incidentally shows other subacute or acute fractures.  He  L5-S1 disc protrusion. Possibly causing nerve root irritation on left S1 although currently asymptomatic. Monitor for now.  E. coli UTI. Acute cystitis. Possible frontal sinusitis Receiving IV ceftriaxone. Monitor.  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 ?  Melena Patient sees hematology outpatient for chronic iron deficiency anemia.  Prior workup for myeloma has been negative. Iron transfusion done on at 10/18. Has macrocytosis as well. Platelet count was 144 in October 2024 and hemoglobin was 8.4. On admission platelet count is 99 and hemoglobin 8.8.  No platelets are improving but hemoglobin continues to worsen.  Iron level actually lower than what it was at the time of October evaluation when she actually received IV iron therapy. Given IV folic acid. Consider IV iron therapy. Transfuse for hemoglobin  less than 7. Hemoglobin continues to trend down.  RN reported that the patient actually had black-colored bowel movement.  Will check FOBT.  Frequent monitoring of CBC.  Hold Eliquis.  Will also keep her on clear liquid diet. Will also initiate IV Protonix with a bolus.  Given no significant melena as well, will check CT abdomen. May require GI consultation in the morning.Marland Kitchen  Hypothyroidism. Continuing Synthroid.  GERD. Continuing Pepcid and PPI.  Mood disorder. On Zoloft.  BuSpar. Continue.  Chronic A-fib/atrial flutter. Currently rate controlled. On amiodarone, continue. Due to bradycardia I will be holding Cardizem. Also on Eliquis. Will continue the same for now.  HTN. Along with above medication also on chlorthalidone.  Will hold that for now.  Hypokalemia. Being replaced.  Obesity Class 3 Body mass index is 44.78 kg/m.  Placing the pt at higher risk of poor outcomes.  Chronic HFpEF  Volume status is adequate. Holding diuretic.  HLD. On Lipitor, continue.  Jaw pain. Patient reports that the right jaw has been hurting severely limiting her ability to eat and chew. Given that she had multiple fractures which were identified incidentally a CT scan of the maxillofacial area was also recommended to ensure that she does not have any fracture of the area as well as fracture of the head. Reassuringly both CT scans are negative. May require ENT evaluation if the pain remains severe.   Subjective: No new issue no vomiting no fever no chills.  Neck pain resolved.  Able to chew.  Physical Exam: In mild distress for No rash. S1-S2 present.  No murmur. Bilateral expiratory wheeze heard. Trace edema bilaterally.  Data Reviewed: I have Reviewed nursing notes, Vitals, and Lab results. Reviewed CBC and CMP.  Reordered CBC and CMP.  Disposition: Status is: Inpatient Remains inpatient appropriate because: Need for H&H stability.  SCDs Start: 05/11/23 2326   Family  Communication: Discussed with daughter on the phone Level of care: Telemetry   Vitals:   05/13/23 2022 05/14/23 0500 05/14/23 0500 05/14/23 1113  BP: 119/67  121/72 131/70  Pulse: 64  (!) 51 (!) 57  Resp: 18  18   Temp: 98.8 F (37.1 C)  98 F (36.7 C) 98.6 F (37 C)  TempSrc: Oral  Oral   SpO2: 100%  100% 98%  Weight:  89.2 kg    Height:         Author: Lynden [place], MD 05/14/2023 7:12 PM  Please look on [email or web address] to find out who is on call.

## 2023-05-14 NOTE — TOC Initial Note (Signed)
Transition of Care [facility]) - Initial/Assessment Note    Patient Details  Name: Peggy Armstrong MRN: [id number] Date of Birth: 12-29-1945  [facility] CM/SW Contact:    Darleene Cleaver, LCSW Phone Number: 05/14/2023, 6:58 PM  Clinical Narrative:                  CSW attempted to speak to patient, but unable to.  CSW spoke to patient's daughter Peggy Armstrong, [phone] to complete assessment.  Per patient's daughter Peggy Armstrong, patient is normally alert and oriented x4, able to complete her ADLs, feed herself, take her medications, walk around apartment with walker, and clean her room.    Per patient's daughter she has not been to SNF before.  CSW explained to patient's daughter what to expect at SNF, the process to get her placed, and also how insurance will pay for stay at [facility].  Patient's daughter asked about what would happen when patient is ready for discharge from SNF, CSW explained to her that there is a Child psychotherapist at SNF who can assist with getting home health services set up and any equipment patient may need to discharge home.  Patient's daughter asked about what would happen if patient is not able to return back home, CSW explained that typically SNF social worker would have to help with trying to find a LTC bed for patient, or ALF if appropriate.  Patient's daughter asked would patient's check have to be turned over while she is at [facility].  CSW informed her that only if patient had to stay longer then short term rehab.  If patient needs to stay at a facility, the facility will discuss options with her.    Patient's daughter asked if PCP would follow patient at SNF, CSW informed her that typically not, but sometimes they will.  Patient's daughter also asked if a PCP could be assigned to her closer to where they live in [place].  CSW recommend she call the insurance company to see who is in network, or to speak to the Child psychotherapist at the SNF and they may have some recommendations for  patient to get a different PCP.  CSW discussed with daughter about looking for SNF placement, patient's daughter gave CSW permission to begin bed search in [facility].  CSW informed her that there are a few facilities in [facility], it would just depend on bed availability on which facility can accept her.  Patient's daughter did not express any other questions or concerns.  TOC to begin bed search in [facility] area.     Expected Discharge Plan: Skilled Nursing Facility Barriers to Discharge: Continued Medical Work up   Patient Goals and CMS Choice Patient states their goals for this hospitalization and ongoing recovery are:: To go to SNF for short term rehab, then return back home. CMS Medicare.gov Compare Post Acute Care list provided to:: Patient Represenative (must comment) (Patient's daughter Peggy Armstrong) Choice offered to / list presented to : Adult Children [place] ownership interest in [facility]provided to:: Adult Children    Expected Discharge Plan and Services     Post Acute Care Choice: Skilled Nursing Facility Living arrangements for the past 2 months: Apartment                                      Prior Living Arrangements/Services Living arrangements for the past 2 months: Apartment  Lives with:: Adult Children Patient language and need for interpreter reviewed:: Yes Do you feel safe going back to the place where you live?: No   Patient and daughter feel she needs short term rehab prior to returning back home.  Need for Family Participation in Patient Care: No (Comment) Care giver support system in place?: No (comment)   Criminal Activity/Legal Involvement Pertinent to Current Situation/Hospitalization: No - Comment as needed  Activities of Daily Living   ADL Screening (condition at time of admission) Independently performs ADLs?: No Does the patient have a NEW difficulty with bathing/dressing/toileting/self-feeding that  is expected to last >3 days?: No Does the patient have a NEW difficulty with getting in/out of bed, walking, or climbing stairs that is expected to last >3 days?: No Does the patient have a NEW difficulty with communication that is expected to last >3 days?: No Is the patient deaf or have difficulty hearing?: No Does the patient have difficulty seeing, even when wearing glasses/contacts?: Yes Does the patient have difficulty concentrating, remembering, or making decisions?: No  Permission Sought/Granted Permission sought to share information with : Case Manager, Magazine features editor, Family Supports Permission granted to share information with : Yes, Release of Information Signed, Yes, Verbal Permission Granted  Share Information with NAME: Lozinski,Cindy Daughter [phone]  [phone]  Permission granted to share info w AGENCY: SNF admissions        Emotional Assessment Appearance:: Appears stated age Attitude/Demeanor/Rapport: Engaged Affect (typically observed): Accepting, Calm, Stable Orientation: : Oriented to Self, Oriented to Place, Oriented to  Time, Oriented to Situation Alcohol / Substance Use: Not Applicable Psych Involvement: No (comment)  Admission diagnosis:  Closed compression fracture of L2 lumbar vertebra, initial encounter (HCC) [S32.020A] Closed compression fracture of L2 vertebra (HCC) [S32.020A] Patient Active Problem List   Diagnosis Date Noted   Generalized weakness 05/12/2023   Acute cystitis 05/12/2023   Dehydration 05/12/2023   Acute prerenal azotemia 05/12/2023   Paroxysmal atrial fibrillation (HCC) 05/12/2023   AAA (abdominal aortic aneurysm) (HCC) 05/12/2023   Hyperlipidemia 05/12/2023   GAD (generalized anxiety disorder) 05/12/2023   Acquired hypothyroidism 05/12/2023   Closed compression fracture of L2 lumbar vertebra, initial encounter (HCC) 05/11/2023   Chronic diastolic CHF (congestive heart failure) (HCC) 11/08/2019   Morbid  obesity (HCC) 11/08/2019   History of DVT (deep vein thrombosis) 11/08/2019   Right shoulder pain 07/26/2018   Duodenal ulcer    Hematemesis 11/05/2017   Macrocytic anemia 11/05/2017   Melena 11/05/2017   Nausea & vomiting    Hypokalemia 07/03/2017   Essential hypertension    Gout    Clotting disorder (HCC)    GERD (gastroesophageal reflux disease)    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 [facility] [place] [address] Phone: [phone] Fax: [phone]     Social Determinants of Health (SDOH) Social History: SDOH Screenings   Food Insecurity: No Food Insecurity (05/12/2023)  Housing: Low Risk  (05/12/2023)  Transportation Needs: No Transportation Needs (05/12/2023)  Utilities: Not At Risk (05/12/2023)  Financial Resource Strain: Low Risk  (12/02/2022)   Received from [facility]  Physical Activity: Unknown (04/05/2022)   Received from [facility], [facility]  Social Connections: Unknown (04/06/2023)   Received from [facility]  Stress: No Stress Concern Present (04/05/2022)   Received from [facility], [facility]  Tobacco Use: Low Risk  (05/11/2023)  Recent Concern: Tobacco Use - Medium Risk (05/04/2023)   Received from [facility]   SDOH Interventions:     Readmission Risk Interventions    05/14/2023    6:44 PM  Readmission Risk Prevention Plan  Transportation Screening Complete  PCP or Specialist Appt within 3-5 Days Complete  HRI or Home  Care Consult Complete  Social Work Consult for Recovery Care Planning/Counseling Complete  Palliative Care Screening Complete  Medication Review Oceanographer) Referral to Pharmacy

## 2023-05-15 ENCOUNTER — Encounter (HOSPITAL_COMMUNITY): Payer: Self-pay | Admitting: Internal Medicine

## 2023-05-15 ENCOUNTER — Inpatient Hospital Stay (HOSPITAL_COMMUNITY): Payer: 59

## 2023-05-15 DIAGNOSIS — Z7901 Long term (current) use of anticoagulants: Secondary | ICD-10-CM

## 2023-05-15 DIAGNOSIS — D500 Iron deficiency anemia secondary to blood loss (chronic): Secondary | ICD-10-CM

## 2023-05-15 DIAGNOSIS — R195 Other fecal abnormalities: Secondary | ICD-10-CM | POA: Diagnosis not present

## 2023-05-15 DIAGNOSIS — S32020A Wedge compression fracture of second lumbar vertebra, initial encounter for closed fracture: Secondary | ICD-10-CM | POA: Diagnosis not present

## 2023-05-15 LAB — CBC
HCT: 26.9 % — ABNORMAL LOW (ref 36.0–46.0)
HCT: 26.8 % — ABNORMAL LOW (ref 36.0–46.0)
HCT: 26.7 % — ABNORMAL LOW (ref 36.0–46.0)
Hemoglobin: 7.7 g/dL — ABNORMAL LOW (ref 12.0–15.0)
Hemoglobin: 8 g/dL — ABNORMAL LOW (ref 12.0–15.0)
Hemoglobin: 8.2 g/dL — ABNORMAL LOW (ref 12.0–15.0)
MCH: 30.3 pg (ref 26.0–34.0)
MCH: 31.1 pg (ref 26.0–34.0)
MCH: 31.8 pg (ref 26.0–34.0)
MCHC: 28.6 g/dL — ABNORMAL LOW (ref 30.0–36.0)
MCHC: 29.9 g/dL — ABNORMAL LOW (ref 30.0–36.0)
MCHC: 30.7 g/dL (ref 30.0–36.0)
MCV: 105.9 fL — ABNORMAL HIGH (ref 80.0–100.0)
MCV: 104.3 fL — ABNORMAL HIGH (ref 80.0–100.0)
MCV: 103.5 fL — ABNORMAL HIGH (ref 80.0–100.0)
Platelets: 130 10*3/uL — ABNORMAL LOW (ref 150–400)
Platelets: 135 10*3/uL — ABNORMAL LOW (ref 150–400)
Platelets: 142 10*3/uL — ABNORMAL LOW (ref 150–400)
RBC: 2.54 MIL/uL — ABNORMAL LOW (ref 3.87–5.11)
RBC: 2.57 MIL/uL — ABNORMAL LOW (ref 3.87–5.11)
RBC: 2.58 MIL/uL — ABNORMAL LOW (ref 3.87–5.11)
RDW: 15.2 % (ref 11.5–15.5)
RDW: 15.2 % (ref 11.5–15.5)
RDW: 15.4 % (ref 11.5–15.5)
WBC: 5.2 10*3/uL (ref 4.0–10.5)
WBC: 5.3 10*3/uL (ref 4.0–10.5)
WBC: 5.7 10*3/uL (ref 4.0–10.5)
nRBC: 0 % (ref 0.0–0.2)
nRBC: 0 % (ref 0.0–0.2)
nRBC: 0 % (ref 0.0–0.2)

## 2023-05-15 LAB — BASIC METABOLIC PANEL
Anion gap: 8 (ref 5–15)
BUN: 42 mg/dL — ABNORMAL HIGH (ref 8–23)
CO2: 28 mmol/L (ref 22–32)
Calcium: 8.1 mg/dL — ABNORMAL LOW (ref 8.9–10.3)
Chloride: 102 mmol/L (ref 98–111)
Creatinine, Ser: 1.07 mg/dL — ABNORMAL HIGH (ref 0.44–1.00)
GFR, Estimated: 53 mL/min — ABNORMAL LOW (ref >60)
Glucose, Bld: 100 mg/dL — ABNORMAL HIGH (ref 70–99)
Potassium: 3.3 mmol/L — ABNORMAL LOW (ref 3.5–5.1)
Sodium: 138 mmol/L (ref 135–145)

## 2023-05-15 LAB — MAGNESIUM: Magnesium: 1.7 mg/dL (ref 1.7–2.4)

## 2023-05-15 MED ORDER — POTASSIUM CHLORIDE CRYS ER 20 MEQ PO TBCR
40.0000 meq | EXTENDED_RELEASE_TABLET | ORAL | Status: AC
  Administered 2023-05-15 (×2): 40 meq via ORAL
  Filled 2023-05-15 (×2): qty 2

## 2023-05-15 MED ORDER — FUROSEMIDE 20 MG PO TABS
20.0000 mg | ORAL_TABLET | Freq: Every day | ORAL | Status: DC
  Administered 2023-05-15 – 2023-05-18 (×4): 20 mg via ORAL
  Filled 2023-05-15 (×4): qty 1

## 2023-05-15 NOTE — Progress Notes (Signed)
Physical Therapy Treatment Patient Details Name: Peggy Armstrong MRN: [id number] DOB: May 25, 1946 Today's Date: 05/15/2023   History of Present Illness Patient is a 77 yo female presenting to the ED for L knee and hip pain and minimal low back pain for the last 5 days on 05/11/23. Found to have burst L2 fracture with CT. Neurosurgery recommended TLSO and conservative management. PMH includes: essential pretension, paroxysmal atrial fibrillation chronically anticoagulated on Eliquis, hyperlipidemia, chronic diastolic heart failure    PT Comments  Pt with much improved pain control and was able to tolerate OOB transfers.  Had +2 for safety but needing mod A log roll to sit , mod to stand, min to pivot to chair.  Increased time with cues and education on back pain.  Continue POC.  Patient will benefit from continued inpatient follow up therapy, <3 hours/day at d/c.    If plan is discharge home, recommend the following: A lot of help with walking and/or transfers;A lot of help with bathing/dressing/bathroom   Can travel by private vehicle     No  Equipment Recommendations  Wheelchair cushion (measurements PT);Wheelchair (measurements PT)    Recommendations for Other Services       Precautions / Restrictions Precautions Precautions: Fall;Back Precaution Booklet Issued: Yes (comment) Precaution Comments: reviewed and provided handout Required Braces or Orthoses: Spinal Brace Spinal Brace: Thoracolumbosacral orthotic;Applied in sitting position Restrictions Weight Bearing Restrictions: No     Mobility  Bed Mobility Overal bed mobility: Needs Assistance Bed Mobility: Rolling, Sidelying to Sit Rolling: Mod assist Sidelying to sit: Mod assist       General bed mobility comments: Cues for log roll, pt getting legs off bed then mod A to lift trunk    Transfers Overall transfer level: Needs assistance Equipment used: Rolling walker (2 wheels) Transfers: Sit to/from Stand, Bed to  chair/wheelchair/BSC Sit to Stand: Mod assist, +2 safety/equipment   Step pivot transfers: +2 safety/equipment, Min assist       General transfer comment: Performed x 3, cues for hand placement, mod A to rise; Min A to steady and provide cues for step pivot to chair    Ambulation/Gait               General Gait Details: only tolerated step pivot   Stairs             Wheelchair Mobility     Tilt Bed    Modified Rankin (Stroke Patients Only)       Balance Overall balance assessment: Needs assistance Sitting-balance support: Feet supported, No upper extremity supported Sitting balance-Leahy Scale: Fair     Standing balance support: Reliant on assistive device for balance, Bilateral upper extremity supported Standing balance-Leahy Scale: Poor Standing balance comment: RW and miN A                            Cognition Arousal: Alert Behavior During Therapy: WFL for tasks assessed/performed Overall Cognitive Status: Within Functional Limits for tasks assessed                                          Exercises      General Comments General comments (skin integrity, edema, etc.): on 2 L O2 with VSS      Pertinent Vitals/Pain Pain Assessment Pain Assessment: 0-10 Pain Score: 4  Pain Location: Low back  Pain Descriptors / Indicators: Sore Pain Intervention(s): Limited activity within patient's tolerance, Monitored during session, Premedicated before session, Repositioned    Home Living                          Prior Function            PT Goals (current goals can now be found in the care plan section) Progress towards PT goals: Progressing toward goals    Frequency    Min 1X/week      PT Plan      Co-evaluation              AM-PAC PT "6 Clicks" Mobility   Outcome Measure  Help needed turning from your back to your side while in a flat bed without using bedrails?: A Lot Help needed moving  from lying on your back to sitting on the side of a flat bed without using bedrails?: A Lot Help needed moving to and from a bed to a chair (including a wheelchair)?: A Lot Help needed standing up from a chair using your arms (e.g., wheelchair or bedside chair)?: A Lot Help needed to walk in hospital room?: Total Help needed climbing 3-5 steps with a railing? : Total 6 Click Score: 10    End of Session Equipment Utilized During Treatment: Gait belt; LSO Activity Tolerance: Patient tolerated treatment well Patient left: with chair alarm set;in chair;with call bell/phone within reach Nurse Communication: Mobility status PT Visit Diagnosis: Other abnormalities of gait and mobility (R26.89);Muscle weakness (generalized) (M62.81);Pain     Time: 1107-1130 PT Time Calculation (min) (ACUTE ONLY): 23 min  Charges:    $Therapeutic Activity: 23-37 mins PT General Charges $$ ACUTE PT VISIT: 1 Visit                     Anise Salvo, PT Acute Rehab [facility] [phone]    Rayetta Humphrey 05/15/2023, 12:23 PM

## 2023-05-15 NOTE — TOC Progression Note (Signed)
Transition of Care [facility]) - Progression Note    Patient Details  Name: Peggy Armstrong MRN: [id number] Date of Birth: Sep 16, 1945  [facility]) CM/SW Contact  Otelia Santee, LCSW Phone Number: 05/15/2023, 2:54 PM  Clinical Narrative:    Met with pt and granddaughter at bedside with pt's daughter present via t/c. Reviewed bed offer for SNF. Family has accepted bed offer for [facility]. TOC will request insurance auth closer to pt's expected discharge date.    Expected Discharge Plan: Skilled Nursing Facility Barriers to Discharge: Continued Medical Work up  Expected Discharge Plan and Services     Post Acute Care Choice: Skilled Nursing Facility Living arrangements for the past 2 months: Apartment                                       Social Determinants of Health (SDOH) Interventions SDOH Screenings   Food Insecurity: No Food Insecurity (05/12/2023)  Housing: Low Risk  (05/12/2023)  Transportation Needs: No Transportation Needs (05/12/2023)  Utilities: Not At Risk (05/12/2023)  Financial Resource Strain: Low Risk  (12/02/2022)   Received from [facility], [facility]  Physical Activity: Unknown (04/05/2022)   Received from [facility]  Social Connections: Unknown (04/06/2023)   Received from [facility]  Stress: No Stress Concern Present (04/05/2022)   Received from [facility], [facility]  Tobacco Use: Low Risk  (05/11/2023)  Recent Concern: Tobacco Use - Medium Risk (05/04/2023)   Received from [facility]    Readmission Risk Interventions    05/14/2023    6:44 PM  Readmission Risk Prevention Plan  Transportation Screening Complete  PCP or Specialist Appt within 3-5 Days Complete  HRI or Home Care Consult Complete  Social Work Consult for Recovery Care Planning/Counseling Complete  Palliative Care Screening Complete  Medication Review [employer]) Referral to  Pharmacy

## 2023-05-15 NOTE — Progress Notes (Signed)
OT Cancellation Note  Patient Details Name: Peggy Armstrong MRN: [id number] DOB: [birth year]   Cancelled Treatment:    Reason Eval/Treat Not Completed: Fatigue/lethargy limiting ability to participate Patient reported that she just got back to bed with nursing. Educated on keeping pillow between legs for alignment patient verbalized understanding. OT to continue to follow and check back as schedule will allow.  [name], MS Acute Rehabilitation Department Office# [phone]  05/15/2023, 2:42 PM

## 2023-05-15 NOTE — Plan of Care (Signed)
  Problem: Education: Goal: Knowledge of General Education information will improve Description: Including pain rating scale, medication(s)/side effects and non-pharmacologic comfort measures Outcome: Progressing   Problem: Health Behavior/Discharge Planning: Goal: Ability to manage health-related needs will improve Outcome: Progressing   Problem: Clinical Measurements: Goal: Ability to maintain clinical measurements within normal limits will improve Outcome: Progressing Goal: Will remain free from infection Outcome: Progressing Goal: Diagnostic test results will improve Outcome: Progressing Goal: Respiratory complications will improve Outcome: Progressing Goal: Cardiovascular complication will be avoided Outcome: Progressing   Problem: Activity: Goal: Risk for activity intolerance will decrease Outcome: Progressing   Problem: Nutrition: Goal: Adequate nutrition will be maintained Outcome: Progressing   Problem: Coping: Goal: Level of anxiety will decrease Outcome: Progressing   Problem: Elimination: Goal: Will not experience complications related to bowel motility Outcome: Progressing Goal: Will not experience complications related to urinary retention Outcome: Progressing   Problem: Pain Management: Goal: General experience of comfort will improve Outcome: Progressing   Problem: Safety: Goal: Ability to remain free from injury will improve Outcome: Progressing   Problem: Skin Integrity: Goal: Risk for impaired skin integrity will decrease Outcome: Progressing   Problem: Activity: Goal: Ability to tolerate increased activity will improve Outcome: Progressing   Problem: Cardiac: Goal: Ability to achieve and maintain adequate cardiopulmonary perfusion will improve Outcome: Progressing

## 2023-05-15 NOTE — Consult Note (Addendum)
Consultation  Referring Provider:   [facility] Dr. Allena Katz Primary Care Physician:  Wilfred Curtis, MD Primary Gastroenterologist:  Dr. Christella Hartigan       Reason for Consultation:   Anemia, melenic stools  DOA: 05/11/2023         Hospital Day: 5         HPI:   Peggy Armstrong is a 77 y.o. 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  Presents to the ER on 11/14 with lower back pain found to have L2 burst fracture, CT lumbar age-indeterminate burst fracture L2 vertebral body up to 99% vertebral body height loss and 6 millimeter retropulsion.  Neurosurgery following for pain control, TLSO brace, PT and OT.  Gastroenterology was consulted when patient's hemoglobin drifted down to 7.3, and she had 3 melenic stools.  Patient was diuresed for diastolic heart failure and hemoglobin went up without intervention to 8. FOBT + Admitting hemoglobin 8.8 drifted down to 7.3 on 11/17, back up without intervention, drifted back to 7.7 with reports of melenic stools.  With diuresis increased to 8.2.  Platelets consistently low at 142. 11/18 CT abdomen pelvis without contrast to rule out retroperitoneal bleed showed unenhanced liver unremarkable status post cholecystectomy no dilation, normal pancreas spleen is borderline enlarged stomach notable for moderate hiatal hernia and intrathoracic stomach no bowel obstruction, diverticulosis without diverticulitis small bilateral pleural effusions, stable L2 fracture. 11/17 albumin 2.4, AST 58, ALT 49, alk phos 107, total bilirubin 0.4 INR 1.8, patient is on Eliquis On 05/12/2023 BUN 30, creatinine 0.93, currently today BUN 42, creatinine 1.07, potassium 3.3.  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  Margin is indeterminant, specimen is received fragmented.  Evaluation is limited by cautery effect"  07/2017 repeat ERCP/EUS with Dr. Edyth Gunnels at [facility] visibly patent stent from biliary tree, tissue destruction the area for the papula with APC, EUS showed normal esophagus normal stomach, no significant pathology in pancreatic head, abnormal echogenicity left lower lobe no tissues obtained suspicious for fatty infiltration. 10/2017 EGD at [facility] with Dr. Jamse Mead showed normal esophagus normal stomach previous ampullectomy, single duodenal polyp removed 2020 office visit with hematology Dr. Tanya Nones 04/14/2023 hematology at [facility] for IDA previous iron infusion 01/04/2023 hemoglobin 8.4,  discussed bone marrow biopsy. She had normal B12 and folate at the time, Hgb 8.4, MCV 101, platelets 144 iron was 25, saturation 9, ferritin 82 and she was set up for repeat IV iron which she had 50 mg iron dextran 11/1  Patient with family at bedside, granddaughter Tresa Endo. Provided some of the history.  Patient was sitting in recliner states she was feeling better than when she first arrived on Friday. She states she has had anemia since at least 2018, she has been on oral iron daily and has had dark stools while she takes oral iron.  Despite oral iron she is still needed intermittent IV infusions of iron. She states there is been short stent where she was taken off the oral iron and she had brown stools at  that time, but put herself back on oral iron. Patient is on Protonix 40 mg daily and Pepcid 20 mg daily outpatient and states she has had no GERD, nausea, vomiting, dysphagia.  She has a good  appetite.  She denies any abdominal pain.  She has a bowel movement daily denies any loose stools.  Last bowel movement was last night she had 3 been more constipated/formed stools denies any hematochezia. Patient has been having worsening lower back pain and some generalized weakness. Patient's had some leg swelling was given fluid pills which helped decrease swelling but she has had to take potassium.   Patient is been on 2 L nasal cannula since being here and not normally on oxygen at home.  Did physical therapy recently with drop in oxygen so they placed her back on it. She tries to watch her sodium intake.  She denies any abdominal swelling, generalized pruritus, jaundice. Patient's daughter send the Child psychotherapist and caregiver. Father with history of pancreatic cancer no history of colon cancer or liver disease in her family. Denies NSAIDs, takes Tylenol, no alcohol history remote smoking history. Patient is never had a colonoscopy and states she never wants 1.  Abnormal ED labs: Abnormal Labs Reviewed  CULTURE, OB URINE - Abnormal; Notable for the following components:      Result Value   Culture   (*)    Value: >=100,000 COLONIES/mL ESCHERICHIA COLI NO GROUP B STREP (S.AGALACTIAE) ISOLATED Performed at [facility], [address], [place], [place] [address]    Organism ID, Bacteria ESCHERICHIA COLI (*)    All other components within normal limits  URINALYSIS, ROUTINE W REFLEX MICROSCOPIC - Abnormal; Notable for the following components:   APPearance CLOUDY (*)    Hgb urine dipstick LARGE (*)    Nitrite POSITIVE (*)    Leukocytes,Ua MODERATE (*)    Bacteria, UA MANY (*)    All other components within normal limits  CBC - Abnormal; Notable for the following components:   RBC 2.85 (*)    Hemoglobin 8.8 (*)    HCT 30.3 (*)    MCV 106.3 (*)    MCHC 29.0 (*)    RDW 16.4 (*)    Platelets 99 (*)    All other components within normal limits  BASIC METABOLIC PANEL -  Abnormal; Notable for the following components:   Potassium 3.0 (*)    Glucose, Bld 106 (*)    BUN 32 (*)    Creatinine, Ser 1.01 (*)    Calcium 8.8 (*)    GFR, Estimated 57 (*)    All other components within normal limits  CBC WITH DIFFERENTIAL/PLATELET - Abnormal; Notable for the following components:   RBC 2.72 (*)    Hemoglobin 8.5 (*)    HCT 29.1 (*)    MCV 107.0 (*)    MCHC 29.2 (*)    RDW 16.5 (*)    Platelets 99 (*)    Lymphs Abs 0.5 (*)    All other components within normal limits  COMPREHENSIVE METABOLIC PANEL - Abnormal; Notable for the following components:   BUN 30 (*)    Calcium 8.7 (*)    Total Protein 5.7 (*)    Albumin 2.9 (*)    AST 57 (*)    ALT 57 (*)    All other components within normal limits  VITAMIN B12 - Abnormal; Notable for the following components:   Vitamin B-12 1,052 (*)    All other components within normal  limits  IRON AND TIBC - Abnormal; Notable for the following components:   Iron 20 (*)    Saturation Ratios 8 (*)    All other components within normal limits  RETICULOCYTES - Abnormal; Notable for the following components:   Retic Ct Pct 4.5 (*)    RBC. 2.73 (*)    Immature Retic Fract 22.1 (*)    All other components within normal limits  PROTIME-INR - Abnormal; Notable for the following components:   Prothrombin Time 20.0 (*)    INR 1.7 (*)    All other components within normal limits  APTT - Abnormal; Notable for the following components:   aPTT 43 (*)    All other components within normal limits  TSH - Abnormal; Notable for the following components:   TSH 6.718 (*)    All other components within normal limits  CBC - Abnormal; Notable for the following components:   RBC 2.48 (*)    Hemoglobin 7.8 (*)    HCT 26.6 (*)    MCV 107.3 (*)    MCHC 29.3 (*)    RDW 15.6 (*)    Platelets 118 (*)    All other components within normal limits  BASIC METABOLIC PANEL - Abnormal; Notable for the following components:   Glucose, Bld 101  (*)    BUN 28 (*)    Calcium 8.5 (*)    All other components within normal limits  CBC - Abnormal; Notable for the following components:   RBC 2.33 (*)    Hemoglobin 7.3 (*)    HCT 25.7 (*)    MCV 110.3 (*)    MCHC 28.4 (*)    Platelets 135 (*)    All other components within normal limits  APTT - Abnormal; Notable for the following components:   aPTT 44 (*)    All other components within normal limits  PROTIME-INR - Abnormal; Notable for the following components:   Prothrombin Time 20.8 (*)    INR 1.8 (*)    All other components within normal limits  RETICULOCYTES - Abnormal; Notable for the following components:   RBC. 2.53 (*)    Immature Retic Fract 16.4 (*)    All other components within normal limits  HEPATIC FUNCTION PANEL - Abnormal; Notable for the following components:   Total Protein 5.2 (*)    Albumin 2.4 (*)    AST 58 (*)    ALT 49 (*)    All other components within normal limits  BASIC METABOLIC PANEL - Abnormal; Notable for the following components:   Potassium 3.3 (*)    Glucose, Bld 100 (*)    BUN 42 (*)    Creatinine, Ser 1.07 (*)    Calcium 8.1 (*)    GFR, Estimated 53 (*)    All other components within normal limits  CBC - Abnormal; Notable for the following components:   RBC 2.77 (*)    Hemoglobin 8.7 (*)    HCT 30.1 (*)    MCV 108.7 (*)    MCHC 28.9 (*)    Platelets 136 (*)    All other components within normal limits  CBC - Abnormal; Notable for the following components:   RBC 2.61 (*)    Hemoglobin 8.1 (*)    HCT 27.7 (*)    MCV 106.1 (*)    MCHC 29.2 (*)    Platelets 140 (*)    All other components within normal limits  CBC - Abnormal; Notable for the following components:  RBC 2.54 (*)    Hemoglobin 7.7 (*)    HCT 26.9 (*)    MCV 105.9 (*)    MCHC 28.6 (*)    Platelets 130 (*)    All other components within normal limits  CBC - Abnormal; Notable for the following components:   RBC 2.57 (*)    Hemoglobin 8.0 (*)    HCT 26.8 (*)     MCV 104.3 (*)    MCHC 29.9 (*)    Platelets 135 (*)    All other components within normal limits  OCCULT BLOOD X 1 CARD TO LAB, STOOL - Abnormal; Notable for the following components:   Fecal Occult Bld POSITIVE (*)    All other components within normal limits    Past Medical History:  Diagnosis Date   Acquired hypothyroidism    Atrial fibrillation (HCC)    DVT (deep venous thrombosis) (HCC)    right DVT   GAD (generalized anxiety disorder)    GERD (gastroesophageal reflux disease)    Gout    Gout    Hypertension    RA (rheumatoid arthritis) (HCC)     Surgical History:  She  has a past surgical history that includes Cholecystectomy; Abdominal hysterectomy; Hernia repair; IR BILIARY DRAIN PLACEMENT WITH CHOLANGIOGRAM (03/25/2017); Esophagogastroduodenoscopy (egd) with propofol (N/A, 03/24/2017); IR CONVERT BILIARY DRAIN TO INT EXT BILIARY DRAIN (04/03/2017); EUS (N/A, 04/06/2017); Endoscopic retrograde cholangiopancreatography (ercp) with propofol (N/A, 06/15/2017); and Esophagogastroduodenoscopy (egd) with propofol (N/A, 11/07/2017). Family History:  Her family history includes Colon cancer in her father; Diabetes in her paternal grandmother; Heart disease in her paternal grandfather; Hypertension in her mother; Prostate cancer in her father; Rheum arthritis in her maternal grandmother. Social History:   reports that she has never smoked. She has never used smokeless tobacco. She reports that she does not drink alcohol and does not use drugs.  Prior to Admission medications   Medication Sig Start Date End Date Taking? Authorizing Provider  acetaminophen (TYLENOL) 325 MG tablet Take 650 mg by mouth every 6 (six) hours as needed for mild pain (pain score 1-3) or moderate pain (pain score 4-6).   Yes [provider]  allopurinol (ZYLOPRIM) 300 MG tablet Take 300 mg by mouth daily.  05/06/15  Yes [provider]  amiodarone (PACERONE) 200 MG tablet Take 1 tablet (200 mg  total) by mouth daily. NEED APPOINTMENT 02/15/23  Yes Croitoru, Mihai, MD  apixaban (ELIQUIS) 5 MG TABS tablet Take 1 tablet (5 mg total) by mouth 2 (two) times daily. Restart on 11/11/2017 12/19/22  Yes Alver Sorrow, NP  atorvastatin (LIPITOR) 20 MG tablet Take 1 tablet (20 mg total) by mouth daily. 12/19/22  Yes Alver Sorrow, NP  busPIRone (BUSPAR) 10 MG tablet Take 1 tablet by mouth 2 (two) times daily. 07/18/18  Yes [provider]  chlorthalidone (HYGROTON) 25 MG tablet Take 1 tablet (25 mg total) by mouth daily. NEED APPOINTMENT 02/15/23 02/10/24 Yes Croitoru, Mihai, MD  cholecalciferol (VITAMIN D) 1000 units tablet Take 1,000 Units by mouth daily.   Yes [provider]  diltiazem (CARDIZEM CD) 120 MG 24 hr capsule Take 1 capsule (120 mg total) by mouth daily. 12/19/22  Yes Alver Sorrow, NP  famotidine (PEPCID) 20 MG tablet Take 1 tablet by mouth every evening. 07/10/18  Yes [provider]  ferrous sulfate 325 (65 FE) MG tablet Take 325 mg by mouth daily with breakfast.   Yes [provider]  KLOR-CON M20 20 MEQ  tablet Take 20 mEq by mouth daily.   Yes [provider]  levothyroxine (SYNTHROID) 75 MCG tablet Take 75 mcg by mouth daily before breakfast.   Yes [provider]  losartan (COZAAR) 25 MG tablet Take 1 tablet (25 mg total) by mouth daily. 12/19/22  Yes Alver Sorrow, NP  pantoprazole (PROTONIX) 40 MG tablet Take 40 mg by mouth daily.   Yes [provider]  sertraline (ZOLOFT) 25 MG tablet Take 25 mg by mouth daily.  07/07/17  Yes [provider]  tiZANidine (ZANAFLEX) 2 MG tablet Take by mouth. 1/2 tablet TID PRN 10/01/20  Yes [provider]  vitamin B-12 (CYANOCOBALAMIN) 1000 MCG tablet Take 1,000 mcg by mouth daily.   Yes [provider]  furosemide (LASIX) 20 MG tablet Take 20 mg by mouth daily. Patient not taking: Reported on 05/11/2023 05/03/23   [provider]     Current Facility-Administered Medications  Medication Dose Route Frequency Provider Last Rate Last Admin   acetaminophen (TYLENOL) tablet 650 mg  650 mg Oral Q12H PRN Rolly Salter, MD       Or   acetaminophen (TYLENOL) suppository 650 mg  650 mg Rectal Q12H PRN Rolly Salter, MD       acetaminophen (TYLENOL) tablet 1,000 mg  1,000 mg Oral TID Rolly Salter, MD   1,000 mg at 05/15/23 0911   albuterol (PROVENTIL) (2.5 MG/3ML) 0.083% nebulizer solution 2.5 mg  2.5 mg Nebulization Q4H PRN Rolly Salter, MD       amiodarone (PACERONE) tablet 200 mg  200 mg Oral Daily Howerter, Justin B, DO   200 mg at 05/15/23 0911   atorvastatin (LIPITOR) tablet 20 mg  20 mg Oral Daily Howerter, Justin B, DO   20 mg at 05/15/23 0912   busPIRone (BUSPAR) tablet 10 mg  10 mg Oral BID Howerter, Justin B, DO   10 mg at 05/15/23 0911   cefadroxil (DURICEF) capsule 500 mg  500 mg Oral BID Rolly Salter, MD   500 mg at 05/15/23 0912   famotidine (PEPCID) tablet 20 mg  20 mg Oral QPM Howerter, Justin B, DO   20 mg at 05/14/23 1743   feeding supplement (ENSURE ENLIVE / ENSURE PLUS) liquid 237 mL  237 mL Oral TID BM Rolly Salter, MD   237 mL at 05/15/23 0912   ferrous sulfate tablet 325 mg  325 mg Oral Q breakfast Howerter, Justin B, DO   325 mg at 05/15/23 0900   folic acid (FOLVITE) tablet 1 mg  1 mg Oral Daily Rolly Salter, MD   1 mg at 05/15/23 0911   levothyroxine (SYNTHROID) tablet 88 mcg  88 mcg Oral Q0600 Rolly Salter, MD   88 mcg at 05/15/23 0618   lidocaine (LIDODERM) 5 % 2 patch  2 patch Transdermal Q24H Rolly Salter, MD   2 patch at 05/14/23 1345   lip balm (CARMEX) ointment   Topical PRN Rolly Salter, MD       melatonin tablet 3 mg  3 mg Oral QHS PRN Howerter, Justin B, DO       morphine (PF) 2 MG/ML injection 2 mg  2 mg Intravenous Q2H PRN Rolly Salter, MD   2 mg at 05/14/23 0816   naloxone [facility]) injection 0.4 mg  0.4 mg Intravenous PRN Howerter, Justin B, DO        ondansetron (ZOFRAN) injection 4 mg  4 mg Intravenous Q6H  PRN Howerter, Chaney Born, DO       Oral care mouth rinse  15 mL Mouth Rinse PRN Howerter, Justin B, DO       oxyCODONE (Oxy IR/ROXICODONE) immediate release tablet 5 mg  5 mg Oral Q4H PRN Rolly Salter, MD   5 mg at 05/15/23 1031   pantoprazole (PROTONIX) injection 40 mg  40 mg Intravenous Q12H Rolly Salter, MD   40 mg at 05/15/23 2440   potassium chloride SA (KLOR-CON M) CR tablet 40 mEq  40 mEq Oral Q2H Rolly Salter, MD   40 mEq at 05/15/23 1109   senna-docusate (Senokot-S) tablet 1 tablet  1 tablet Oral BID Rolly Salter, MD   1 tablet at 05/15/23 0910   sertraline (ZOLOFT) tablet 25 mg  25 mg Oral Daily Howerter, Justin B, DO   25 mg at 05/15/23 1027   tiZANidine (ZANAFLEX) tablet 1 mg  1 mg Oral TID Rolly Salter, MD   1 mg at 05/15/23 2536    Allergies as of 05/11/2023 - Review Complete 05/11/2023  Allergen Reaction Noted   Codeine Nausea And Vomiting 04/18/2011    Review of Systems:    Constitutional: No weight loss, fever, chills, weakness or fatigue HEENT: Eyes: No change in vision               Ears, Nose, Throat:  No change in hearing or congestion Skin: No rash or itching Cardiovascular: No chest pain, chest pressure or palpitations   Respiratory: No SOB or cough Gastrointestinal: See HPI and otherwise negative Genitourinary: No dysuria or change in urinary frequency Neurological: No headache, dizziness or syncope Musculoskeletal: No new muscle or joint pain Hematologic: No bleeding or bruising Psychiatric: No history of depression or anxiety     Physical Exam:  Vital signs in last 24 hours: Temp:  [98.5 F (36.9 C)-99.2 F (37.3 C)] 98.5 F (36.9 C) (11/18 0520) Pulse Rate:  [54-63] 63 (11/18 0520) Resp:  [17] 17 (11/18 0520) BP: (135-139)/(59-109) 139/59 (11/18 0520) SpO2:  [92 %-99 %] 99 % (11/18 0520) Weight:  [118.3 kg] 118.3 kg (11/18 0500) Last BM Date : 05/14/23 Last BM recorded by nurses  in past 5 days Stool Type: Type 6 (Mushy consistency with ragged edges) (05/14/2023  9:15 PM)  General:   Chronically ill-appearing female in no acute distress Head:  Normocephalic and atraumatic. Eyes: sclerae anicteric,conjunctive pink  Heart: Irregular regular rhythm no murmur Pulm: Clear anteriorly; no wheezing, on 2 L nasal cannula Abdomen:  Soft, Obese AB, Active bowel sounds. No tenderness . Without guarding and Without rebound, No organomegaly appreciated. Extremities:  With edema trace, bilateral ecchymoses Msk:  Symmetrical without gross deformities. Peripheral pulses intact.  Neurologic:  Alert and  oriented x4;  No focal deficits.  No clonus or asterixis, mild left nonintention tremor. Skin:   Dry and intact without significant lesions or rashes.  Bilateral ecchymoses Psychiatric:  Cooperative. Normal mood and affect.  LAB RESULTS: Recent Labs    05/14/23 2202 05/15/23 0326 05/15/23 0537  WBC 6.1 5.2 5.3  HGB 8.1* 7.7* 8.0*  HCT 27.7* 26.9* 26.8*  PLT 140* 130* 135*   BMET Recent Labs    05/14/23 0553 05/15/23 0327  NA 138 138  K 3.9 3.3*  CL 104 102  CO2 28 28  GLUCOSE 101* 100*  BUN 28* 42*  CREATININE 0.94 1.07*  CALCIUM 8.5* 8.1*   LFT Recent Labs    05/14/23 1313  PROT 5.2*  ALBUMIN 2.4*  AST 58*  ALT 49*  ALKPHOS 107  BILITOT 0.4  BILIDIR 0.1  IBILI 0.3   PT/INR Recent Labs    05/14/23 1313  LABPROT 20.8*  INR 1.8*    STUDIES: CT ABDOMEN PELVIS WO CONTRAST  Result Date: 05/15/2023 CLINICAL DATA:  Anemia, concern for retroperitoneal bleed EXAM: CT ABDOMEN AND PELVIS WITHOUT CONTRAST TECHNIQUE: Multidetector CT imaging of the abdomen and pelvis was performed following the standard protocol without IV contrast. RADIATION DOSE REDUCTION: This exam was performed according to the departmental dose-optimization program which includes automated exposure control, adjustment of the mA and/or kV according to patient size and/or use of iterative  reconstruction technique. COMPARISON:  CT lumbar spine dated 05/11/2023. CT abdomen/pelvis dated 03/22/2017. FINDINGS: Lower chest: Small bilateral pleural effusions with associated right basilar atelectasis. Hepatobiliary: Unenhanced liver is unremarkable. Status post cholecystectomy. No intrahepatic or extrahepatic dilatation. Pancreas: Within normal limits. Spleen: Borderline enlarged, measuring 14.2 cm in maximal craniocaudal dimension. Adrenals/Urinary Tract: Adrenal glands are within normal limits. Kidneys are within normal limits. No renal, ureteral, or bladder calculi. No hydronephrosis. Bladder is within normal limits. Stomach/Bowel: Stomach is notable for a moderate hiatal hernia/intrathoracic stomach. No evidence of bowel obstruction. Appendix is not discretely visualized. Mild sigmoid diverticulosis, without evidence of diverticulitis. Vascular/Lymphatic: No evidence of abdominal aortic aneurysm. Specifically, no evidence of suprarenal abdominal aortic aneurysm when correlating with recent CT. Atherosclerotic calcifications of the abdominal aorta and branch vessels. No suspicious abdominopelvic lymphadenopathy. Reproductive: Status post hysterectomy. No adnexal masses. Other: No abdominopelvic ascites. No hemoperitoneum or free air. Musculoskeletal: No evidence of intramuscular/retroperitoneal hematoma. Moderate burst fracture at L2 with mild retropulsion, unchanged from recent CT, new from 2018. IMPRESSION: No evidence of intramuscular/retroperitoneal hematoma. Small bilateral pleural effusions with associated right basilar atelectasis. Stable L2 fracture, as above. Electronically Signed   By: Charline Bills M.D.   On: 05/15/2023 09:55   CT MAXILLOFACIAL WO CONTRAST  Result Date: 05/13/2023 CLINICAL DATA:  TMJ pain.  Limited movement of the right jaw. EXAM: CT MAXILLOFACIAL WITHOUT CONTRAST TECHNIQUE: Multidetector CT imaging of the maxillofacial structures was performed. Multiplanar CT image  reconstructions were also generated. RADIATION DOSE REDUCTION: This exam was performed according to the departmental dose-optimization program which includes automated exposure control, adjustment of the mA and/or kV according to patient size and/or use of iterative reconstruction technique. COMPARISON:  None Available. FINDINGS: Osseous: No fracture or mandibular dislocation. No destructive process. Patient is edentulous. TMJ is within normal limits bilaterally. Degenerative changes are present at C1-2. Orbits: The globes and orbits are within normal limits. Sinuses: Secretions are present in the left frontal sinus and anterior ethmoid air cells. The paranasal sinuses and mastoid air cells are otherwise clear. Soft tissues: Facial soft tissues are within normal limits. No focal inflammatory changes are present. Limited intracranial: Within normal limits. IMPRESSION: 1. No acute or focal lesion to explain the patient's symptoms. 2. Secretions in the left frontal sinus and anterior ethmoid air cells. 3. Degenerative changes at C1-2. Electronically Signed   By: Marin Roberts M.D.   On: 05/13/2023 17:00   CT HEAD WO CONTRAST ( )  Result Date: 05/13/2023 CLINICAL DATA:  Headache, new onset.  Right jaw pain. EXAM: CT HEAD WITHOUT CONTRAST TECHNIQUE: Contiguous axial images were obtained from the base of the skull through the vertex without intravenous contrast. RADIATION DOSE REDUCTION: This exam was performed according to the departmental dose-optimization program which includes automated exposure control, adjustment of the mA and/or kV according to patient size  and/or use of iterative reconstruction technique. COMPARISON:  None available. FINDINGS: Brain: Mild atrophy and white matter changes are present bilaterally. No acute infarct, hemorrhage, or mass lesion is present. Deep brain nuclei are within normal limits. The ventricles are of normal size. No significant extraaxial fluid collection is present.  The brainstem and cerebellum are within normal limits. Midline structures are within normal limits. Vascular: Atherosclerotic calcifications are present at the cavernous internal carotid arteries bilaterally and at the dural margin of both vertebral arteries. No hyperdense vessel is present. Skull: Calvarium is intact. No focal lytic or blastic lesions are present. No significant extracranial soft tissue lesion is present. Sinuses/Orbits: Fluid is present in the scratched at scree shins are present in the left frontal sinus and anterior ethmoid air cells. The paranasal sinuses and mastoid air cells are otherwise clear. The globes and orbits are within normal limits. IMPRESSION: 1. No acute intracranial abnormality. 2. Mild atrophy and white matter disease likely reflects the sequela of chronic microvascular ischemia. 3. Fluid in the left frontal sinus and anterior ethmoid air cells. This may reflect acute sinusitis. Electronically Signed   By: Marin Roberts M.D.   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  Thrombocytopenia Fatty liver seen 2019, mild splenomegaly on current CT  noncontrast Potentially nutritional/bone marrow related, being followed by [facility] hematology  Elevated LFTs Patient with known fatty liver, on amiodarone  History of atrial fibrillation On Eliquis, currently on hold  Urinary tract infection E. Coli On cefadroxil   Morbid obesity BMI greater than 50  Close compression fracture L2-unknown cause Getting PT/OT/supportive care Will follow-up with neurosurgery outpatient  Chronic diastolic heart failure/AAA Monitor fluid status, per medicine Currently on 2 L nasal cannula  Principal Problem:   Closed compression fracture of L2 lumbar vertebra, initial encounter [facility]) Active Problems:   Essential hypertension   Hypokalemia   Macrocytic anemia   Chronic diastolic CHF (congestive heart failure) (HCC)   Generalized weakness   Acute cystitis   Dehydration   Acute prerenal azotemia   Paroxysmal atrial fibrillation (HCC)   AAA (abdominal aortic aneurysm) (HCC)   Hyperlipidemia   GAD (generalized anxiety disorder)   Acquired hypothyroidism    LOS: 4 days     Plan   -Right upper quadrant ultrasound -Monitor platelets -Continue follow-up hematology - last dose of Eliquis 11/17 in the morning, continue to hold -Protonix 40 mg twice daily IV --Continue to monitor H&H with transfusion as needed to maintain hemoglobin greater than 7. -Patient may benefit from EGD this admission but will defer procedure until her cardiopulmonary status is optimized /stabilized. -Clear liquid diet today and tomorrow, will reevaluate tomorrow and discuss further with Dr. Russella Dar if EGD  is appropriate -Patient has never had colonoscopy, declines  Thank you for your kind consultation, we will continue to follow.   Doree Albee  05/15/2023, 11:14 AM   Attending Physician Note   I have taken a history, reviewed the chart and examined the patient. I performed a substantive portion of this encounter, including complete performance of at least one  of the key components, in conjunction with the APP. I agree with the APP's note, impression and recommendations with my edits. My additional impressions and recommendations are as follows.   Acute on chronic anemia, iron deficiency anemia, FOBT + stool, dark stools on Fe. R/O ulcer, Cameron erosions, AVMs. Continue PPI bid. Trend CBC. Continue to hold Eliquis.  Pt declines colonoscopy.  Tentatively plan for EGD tomorrow afternoon and reassess cardiopulmonary status tomorrow morning.   Claudette Head, MD [facility] See AMION, [facility], for our on call provider

## 2023-05-15 NOTE — Progress Notes (Signed)
[facility] Progress Note Patient: Peggy Armstrong YQM:[id number] DOB: [birth year] DOA: 05/11/2023  DOS: the patient was seen and examined on 05/15/2023  Brief hospital course: PMH of HTN, atrial flutter/chronic A-fib on Eliquis, HLD, chronic HFpEF, GERD, DVT 2017. Presents with complaints of 5 days of left hip pain and generalized fatigue. Seen recently by PCP on 11/6 for bilateral leg swelling and was started on Lasix. No fall no trauma reported by the patient.  Assessment and Plan: Subacute L2 burst fracture. Acute insufficiency fracture of bilateral sacral alla Subacute right 10, 11, 12 rib fracture Etiology of the fracture is not clear. Concern for pathological fracture of per report. 6 mm retropulsion seen. ED provider discussed with Dr. Conchita Paris who recommended conservative measures.  No indication to transfer to [facility].  TLSO brace pain control and PT OT. Outpatient follow-up with neurosurgery recommended. Patient with ongoing severe pain right now. MRI reassuring for any cord damage.  But also incidentally shows other subacute or acute fractures.  He  L5-S1 disc protrusion. Possibly causing nerve root irritation on left S1 although currently asymptomatic. Monitor for now.  E. coli UTI. Acute cystitis. Possible frontal sinusitis Receiving IV ceftriaxone.  Now switched to cefadroxil.  Will complete 5-day treatment course. Monitor.  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 Patient sees hematology outpatient for chronic iron deficiency anemia.  Prior workup for myeloma has been negative. Iron transfusion done on at 10/18. Has macrocytosis as well. Platelet count was 144 in October 2024 and hemoglobin was 8.4. Currently platelet count is 99 and hemoglobin 8.8. Iron level actually lower than what it was at the time of October evaluation when she actually received IV iron therapy. Given IV folic acid. Consider IV iron therapy. May require hematology consultation if  count continues to trend down further. Transfuse for hemoglobin less than 7. Hold Eliquis.  GI consulted.  Will require EGD. Continue PPI twice daily.  Hypothyroidism. Continuing Synthroid.  GERD. Continuing Pepcid and PPI.  Mood disorder. On Zoloft.  BuSpar. Continue.  Chronic A-fib/atrial flutter. Currently rate controlled. On amiodarone, continue. Due to bradycardia I will be holding Cardizem. Also on Eliquis.  For now on hold due to bleeding.  HTN. Along with above medication also on chlorthalidone.  Will hold that for now.  Hypokalemia. Being replaced.  Obesity Class 3 Body mass index is 44.78 kg/m.  Placing the pt at higher risk of poor outcomes.  Chronic HFpEF  Volume status is adequate. Holding diuretic.  HLD. On Lipitor, continue.  Jaw pain. Patient reports that the right jaw has been hurting severely limiting her ability to eat and chew. Given that she had multiple fractures which were identified incidentally a CT scan of the maxillofacial area was also recommended to ensure that she does not have any fracture of the area as well as fracture of the head. Reassuringly both CT scans are negative. May require ENT evaluation if the pain remains severe.   Subjective: No nausea no vomiting no fever no chills.  No jaw pain.  Physical Exam: General: in Mild distress, No Rash Cardiovascular: S1 and S2 Present, No Murmur Respiratory: Good respiratory effort, Bilateral Air entry present. No Crackles, occasional expiratory wheezes Abdomen: Bowel Sound present, No tenderness Extremities: No edema Neuro: Alert and oriented x3, no new focal deficit  Data Reviewed: I have Reviewed nursing notes, Vitals, and Lab results. Since last encounter, pertinent lab results CBC and CMP   . I have ordered test including CBC and BMP  . I have  discussed pt's care plan and test results with [facility]  .   Disposition: Status is: Inpatient Remains inpatient appropriate  because: Monitor for improvement in H&H  SCDs Start: 05/11/23 2326   Family Communication: Family at bedside Level of care: Telemetry continue due to anemia Vitals:   05/14/23 2013 05/15/23 0500 05/15/23 0520 05/15/23 1420  BP: (!) 135/109  (!) 139/59 (!) 122/58  Pulse: (!) 54  63 (!) 59  Resp: 17  17 16   Temp: 99.2 F (37.3 C)  98.5 F (36.9 C) 98.1 F (36.7 C)  TempSrc: Oral  Oral Oral  SpO2: 92%  99% 98%  Weight:  118.3 kg    Height:         Author: Lynden Oxford, MD 05/15/2023 7:40 PM  Please look on [email or web address] to find out who is on call.

## 2023-05-16 ENCOUNTER — Encounter (HOSPITAL_COMMUNITY): Payer: Self-pay | Admitting: Internal Medicine

## 2023-05-16 ENCOUNTER — Inpatient Hospital Stay (HOSPITAL_COMMUNITY): Payer: 59 | Admitting: Anesthesiology

## 2023-05-16 ENCOUNTER — Encounter (HOSPITAL_COMMUNITY)
Admission: EM | Disposition: A | Discharge status: Skilled Nursing Facility | Payer: Self-pay | Source: Home / Self Care | Attending: Internal Medicine

## 2023-05-16 DIAGNOSIS — R195 Other fecal abnormalities: Secondary | ICD-10-CM

## 2023-05-16 DIAGNOSIS — K449 Diaphragmatic hernia without obstruction or gangrene: Secondary | ICD-10-CM | POA: Diagnosis not present

## 2023-05-16 DIAGNOSIS — K2100 Gastro-esophageal reflux disease with esophagitis, without bleeding: Secondary | ICD-10-CM

## 2023-05-16 DIAGNOSIS — K317 Polyp of stomach and duodenum: Secondary | ICD-10-CM

## 2023-05-16 DIAGNOSIS — D509 Iron deficiency anemia, unspecified: Secondary | ICD-10-CM | POA: Diagnosis not present

## 2023-05-16 DIAGNOSIS — D500 Iron deficiency anemia secondary to blood loss (chronic): Secondary | ICD-10-CM | POA: Diagnosis not present

## 2023-05-16 HISTORY — PX: BIOPSY: SHX5522

## 2023-05-16 HISTORY — DX: Polyp of stomach and duodenum: K31.7

## 2023-05-16 HISTORY — PX: POLYPECTOMY: SHX5525

## 2023-05-16 HISTORY — PX: ESOPHAGOGASTRODUODENOSCOPY (EGD) WITH PROPOFOL: SHX5813

## 2023-05-16 HISTORY — DX: Other fecal abnormalities: R19.5

## 2023-05-16 LAB — CBC
HCT: 31.1 % — ABNORMAL LOW (ref 36.0–46.0)
Hemoglobin: 9.4 g/dL — ABNORMAL LOW (ref 12.0–15.0)
MCH: 31.9 pg (ref 26.0–34.0)
MCHC: 30.2 g/dL (ref 30.0–36.0)
MCV: 105.4 fL — ABNORMAL HIGH (ref 80.0–100.0)
Platelets: 145 10*3/uL — ABNORMAL LOW (ref 150–400)
RBC: 2.95 MIL/uL — ABNORMAL LOW (ref 3.87–5.11)
RDW: 15.4 % (ref 11.5–15.5)
WBC: 4.3 10*3/uL (ref 4.0–10.5)
nRBC: 0 % (ref 0.0–0.2)

## 2023-05-16 LAB — BASIC METABOLIC PANEL
Anion gap: 8 (ref 5–15)
BUN: 33 mg/dL — ABNORMAL HIGH (ref 8–23)
CO2: 28 mmol/L (ref 22–32)
Calcium: 8.5 mg/dL — ABNORMAL LOW (ref 8.9–10.3)
Chloride: 99 mmol/L (ref 98–111)
Creatinine, Ser: 0.99 mg/dL (ref 0.44–1.00)
GFR, Estimated: 59 mL/min — ABNORMAL LOW (ref >60)
Glucose, Bld: 83 mg/dL (ref 70–99)
Potassium: 3.7 mmol/L (ref 3.5–5.1)
Sodium: 135 mmol/L (ref 135–145)

## 2023-05-16 LAB — MAGNESIUM: Magnesium: 1.7 mg/dL (ref 1.7–2.4)

## 2023-05-16 SURGERY — ESOPHAGOGASTRODUODENOSCOPY (EGD) WITH PROPOFOL
Anesthesia: Monitor Anesthesia Care

## 2023-05-16 MED ORDER — SODIUM CHLORIDE 0.9 % IV SOLN: INTRAVENOUS | Status: DC

## 2023-05-16 MED ORDER — PROPOFOL 500 MG/50ML IV EMUL
INTRAVENOUS | Status: DC | PRN
  Administered 2023-05-16: 100 ug/kg/min via INTRAVENOUS

## 2023-05-16 MED ORDER — PROPOFOL 10 MG/ML IV BOLUS
INTRAVENOUS | Status: AC
  Filled 2023-05-16: qty 20

## 2023-05-16 MED ORDER — PROPOFOL 500 MG/50ML IV EMUL
INTRAVENOUS | Status: AC
  Filled 2023-05-16: qty 50

## 2023-05-16 MED ORDER — PHENYLEPHRINE 80 MCG/ML (10ML) SYRINGE FOR IV PUSH (FOR BLOOD PRESSURE SUPPORT)
PREFILLED_SYRINGE | INTRAVENOUS | Status: DC | PRN
  Administered 2023-05-16 (×3): 80 ug via INTRAVENOUS

## 2023-05-16 MED ORDER — EPHEDRINE SULFATE-NACL 50-0.9 MG/10ML-% IV SOSY
PREFILLED_SYRINGE | INTRAVENOUS | Status: DC | PRN
  Administered 2023-05-16: 5 mg via INTRAVENOUS
  Administered 2023-05-16: 10 mg via INTRAVENOUS
  Administered 2023-05-16: 5 mg via INTRAVENOUS

## 2023-05-16 MED ORDER — PROPOFOL 10 MG/ML IV BOLUS
INTRAVENOUS | Status: DC | PRN
  Administered 2023-05-16: 50 mg via INTRAVENOUS

## 2023-05-16 SURGICAL SUPPLY — 14 items

## 2023-05-16 NOTE — TOC Progression Note (Signed)
Transition of Care [facility]) - Progression Note    Patient Details  Name: Peggy Armstrong MRN: [id number] Date of Birth: Apr 04, 1946  Transition of Care [facility]) CM/SW Contact  Otelia Santee, LCSW Phone Number: 05/16/2023, 10:57 AM  Clinical Narrative:    Pt potentially to be medically stable for discharge on 11/21. Insurance Berkley Harvey has been requested and is currently pending decision.    Expected Discharge Plan: Skilled Nursing Facility Barriers to Discharge: Continued Medical Work up  Expected Discharge Plan and Services     Post Acute Care Choice: Skilled Nursing Facility Living arrangements for the past 2 months: Apartment                                       Social Determinants of Health (SDOH) Interventions SDOH Screenings   Food Insecurity: No Food Insecurity (05/12/2023)  Housing: Low Risk  (05/12/2023)  Transportation Needs: No Transportation Needs (05/12/2023)  Utilities: Not At Risk (05/12/2023)  Financial Resource Strain: Low Risk  (12/02/2022)   Received from [facility]  Physical Activity: Unknown (04/05/2022)   Received from [facility]  Social Connections: Unknown (04/06/2023)   Received from [facility]  Stress: No Stress Concern Present (04/05/2022)   Received from [facility]  Tobacco Use: Low Risk  (05/11/2023)  Recent Concern: Tobacco Use - Medium Risk (05/04/2023)   Received from [facility]    Readmission Risk Interventions    05/14/2023    6:44 PM  Readmission Risk Prevention Plan  Transportation Screening Complete  PCP or Specialist Appt within 3-5 Days Complete  HRI or Home Care Consult Complete  Social Work Consult for Recovery Care Planning/Counseling Complete  Palliative Care Screening Complete  Medication Review Oceanographer) Referral to Pharmacy

## 2023-05-16 NOTE — Op Note (Addendum)
[facility] Patient Name: Peggy Armstrong Procedure Date: 05/16/2023 MRN: [id number] Attending MD: Meryl Dare , MD, [phone] Date of Birth: 1945/12/26 CSN: [id number] Age: 77 Admit Type: Inpatient Procedure:                Upper GI endoscopy Indications:              Unexplained iron deficiency anemia, Heme positive                            stool, History of ampullary adenoma / carcinoma S/P                            endoscopy resection at [facility] Providers:                Venita Lick. Russella Dar, MD, Beryle Beams, Technician,                            Rozetta Nunnery, Technician, Derinda Late,                            Technician Referring MD:             [facility] Medicines:                Monitored Anesthesia Care Complications:            No immediate complications. Estimated Blood Loss:     Estimated blood loss was minimal. Procedure:                Pre-Anesthesia Assessment:                           - Prior to the procedure, a History and Physical                            was performed, and patient medications and                            allergies were reviewed. The patient's tolerance of                            previous anesthesia was also reviewed. The risks                            and benefits of the procedure and the sedation                            options and risks were discussed with the patient.                            All questions were answered, and informed consent                            was obtained. Prior Anticoagulants: The patient has  taken Eliquis (apixaban), last dose was 2 days                            prior to procedure. ASA Grade Assessment: III - A                            patient with severe systemic disease. After                            reviewing the risks and benefits, the patient was                            deemed in satisfactory condition to undergo the                             procedure.                           After obtaining informed consent, the endoscope was                            passed under direct vision. Throughout the                            procedure, the patient's blood pressure, pulse, and                            oxygen saturations were monitored continuously. The                            [id number]-[id number] ([id number]) Olympus endoscope was introduced                            through the mouth, and advanced to the third part                            of duodenum. The upper GI endoscopy was                            accomplished without difficulty. The patient                            tolerated the procedure well. Scope In: Scope Out: Findings:      LA Grade B (one or more mucosal breaks greater than 5 mm, not extending       between the tops of two mucosal folds) esophagitis with no bleeding was       found in the distal esophagus.      The exam of the esophagus was otherwise normal.      A medium-sized hiatal hernia was present.      The gastroesophageal flap valve was visualized endoscopically and       classified as Hill Grade IV (no fold, wide open lumen, hiatal hernia       present).      Multiple 6 to 8 mm sessile  polyps with bleeding and 2 with stigmata of       recent bleeding were found in the gastric antrum. The 2 polyps were       removed with a cold snare. Resection and retrieval were complete.      The exam of the stomach was otherwise normal.      A medium-sized fungating multilobulated mass with no bleeding was found       in the second portion of the duodenum. Biopsies were taken with a cold       forceps for histology.      The exam of the duodenum was otherwise normal. Impression:               - LA Grade B reflux esophagitis with no bleeding.                           - Medium-sized hiatal hernia.                           - Gastroesophageal flap valve classified as Hill                            Grade IV (no fold,  wide open lumen, hiatal hernia                            present).                           - Multiple gastric polyps. Two were bleeding.                            Resected and retrieved.                           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 Moderate Sedation:      Not Applicable - Patient had care per Anesthesia. Recommendation:           - Return patient to hospital ward for ongoing care.                           - Soft diet today.                           - Continue present medications.                           - Await pathology results.                           - Resume Eliquis (apixaban) at prior dose no sooner                            than 3 days. Refer to managing physician for                            further adjustment of therapy. Risk of persisent,  recurrent bleeding wtih dudoenal mass, gastric                            polyps.                           - Discuss outpatient follow up with Dr. Corliss Parish,                            [facility], for mgmt of duodenal mass. Procedure Code(s):        --- Professional ---                           [phone], Esophagogastroduodenoscopy, flexible,                            transoral; with removal of tumor(s), polyp(s), or                            other lesion(s) by snare technique                           43239, 59, Esophagogastroduodenoscopy, flexible,                            transoral; with biopsy, single or multiple Diagnosis Code(s):        --- Professional ---                           K21.00, Gastro-esophageal reflux disease with                            esophagitis, without bleeding                           K44.9, Diaphragmatic hernia without obstruction or                            gangrene                           K31.7, Polyp of stomach and duodenum                           K31.89, Other diseases of stomach and duodenum                           D50.9, Iron deficiency  anemia, unspecified                           R19.5, Other fecal abnormalities CPT copyright 2022 American Medical Association. All rights reserved. The codes documented in this report are preliminary and upon coder review may  be revised to meet current compliance requirements. Meryl Dare, MD 05/16/2023 1:55:50 PM This report has been signed electronically. Number of Addenda: 0

## 2023-05-16 NOTE — Progress Notes (Signed)
[facility] Progress Note Patient: Peggy Armstrong [id number]:[id number] DOB: [birth year] DOA: 05/11/2023  DOS: the patient was seen and examined on 05/16/2023  Brief hospital course: PMH of HTN, atrial flutter/chronic A-fib on Eliquis, HLD, chronic HFpEF, GERD, DVT 2017. Presents with complaints of 5 days of left hip pain and generalized fatigue. Seen recently by PCP on 11/6 for bilateral leg swelling and was started on Lasix. No fall no trauma reported by the patient.  Assessment and Plan: Subacute L2 burst fracture. Acute insufficiency fracture of bilateral sacral alla Subacute right 10, 11, 12 rib fracture Etiology of the fracture is not clear. Concern for pathological fracture of per report. 6 mm retropulsion seen. ED provider discussed with Dr. Conchita Paris who recommended conservative measures.  No indication to transfer to [facility].  TLSO brace pain control and PT OT. Outpatient follow-up with neurosurgery recommended. Patient with ongoing severe pain right now. MRI reassuring for any cord damage.  But also incidentally shows other subacute or acute fractures.  He  L5-S1 disc protrusion. Possibly causing nerve root irritation on left S1 although currently asymptomatic. Monitor for now.  E. coli UTI. Acute cystitis. Possible frontal sinusitis Receiving IV ceftriaxone.  Now switched to cefadroxil.  Will complete 5-day treatment course. Monitor.  Acute blood loss anemia chronic iron deficiency anemia. Mild thrombocytopenia. Bleeding gastric polyps.  Esophagitis.  Hiatal hernia.  Duodenal mass. Patient sees hematology outpatient for chronic iron deficiency anemia.  Prior workup for myeloma has been negative. Iron transfusion done on at 10/18. Has macrocytosis as well. Platelet count was 144 in October 2024 and hemoglobin was 8.4. Currently platelet count is 99 and hemoglobin 8.8. Iron level actually lower than what it was at the time of October evaluation when she  actually received IV iron therapy. Given IV folic acid.  Will give IV iron as well. Transfuse for hemoglobin less than 7. Hold Eliquis. GI consulted.  Underwent EGD on 11/19.  Found to have a duodenal mass.  Gastric polyps which were bleeding as well as esophagitis which was not bleeding. Currently on PPI.  Soft diet.  Recommended starting Eliquis on 11/23.  Hypothyroidism. Continuing Synthroid.  GERD. Continuing Pepcid and PPI.  Mood disorder. On Zoloft.  BuSpar. Continue.  Chronic A-fib/atrial flutter. Currently rate controlled. On amiodarone, continue. Due to bradycardia I will be holding Cardizem. Also on Eliquis.  For now on hold due to bleeding.  HTN. Chronic diastolic CHF. Home regimen includes Cardizem, Lasix, losartan. Currently losartan is on hold.  Cardizem was on hold for bradycardia. Lasix resumed.  Monitor.  Hypokalemia. Being replaced.  Obesity Class 3 Body mass index is 44.78 kg/m.  Placing the pt at higher risk of poor outcomes.  Chronic HFpEF  Volume status is adequate. Holding diuretic.  HLD. On Lipitor, continue.  Jaw pain. Patient reports that the right jaw has been hurting severely limiting her ability to eat and chew. Given that she had multiple fractures which were identified incidentally a CT scan of the maxillofacial area was also recommended to ensure that she does not have any fracture of the area as well as fracture of the head. Reassuringly both CT scans are negative.   Subjective: No nausea no vomiting.  No other acute complaint.  No fever no chills.  Physical Exam:  General: in Mild distress, No Rash Cardiovascular: S1 and S2 Present, No Murmur Respiratory: Good respiratory effort, Bilateral Air entry present. No Crackles, No wheezes Abdomen: Bowel Sound present, No tenderness Extremities: No edema Neuro: Alert  and oriented x3, no new focal deficit  Data Reviewed: I have Reviewed nursing notes, Vitals, and Lab results. Since  last encounter, pertinent lab results CBC and CMP   . I have ordered test including CBC and CMP  .  Discussed with  GI  Disposition: Status is: Inpatient Remains inpatient appropriate because: Monitor for stabilization of H&H  SCDs Start: 05/11/23 2326   Family Communication: Family at bedside Level of care: Telemetry continue due to anemia Vitals:   05/16/23 1440 05/16/23 1458 05/16/23 1500 05/16/23 1526  BP: 103/65 117/66 (!) 113/57 117/75  Pulse: 69 68 66 67  Resp: 15   16  Temp:    98.6 F (37 C)  TempSrc:      SpO2: 96% 99% 99% 100%  Weight:      Height:         Author: Lynden Oxford, MD 05/16/2023 5:47 PM  Please look on [email or web address] to find out who is on call.

## 2023-05-16 NOTE — Plan of Care (Signed)
  Problem: Education: Goal: Knowledge of General Education information will improve Description: Including pain rating scale, medication(s)/side effects and non-pharmacologic comfort measures Outcome: Progressing   Problem: Health Behavior/Discharge Planning: Goal: Ability to manage health-related needs will improve Outcome: Progressing   Problem: Clinical Measurements: Goal: Ability to maintain clinical measurements within normal limits will improve Outcome: Progressing Goal: Will remain free from infection Outcome: Progressing Goal: Diagnostic test results will improve Outcome: Progressing Goal: Respiratory complications will improve Outcome: Progressing Goal: Cardiovascular complication will be avoided Outcome: Progressing   Problem: Activity: Goal: Risk for activity intolerance will decrease Outcome: Progressing   Problem: Nutrition: Goal: Adequate nutrition will be maintained Outcome: Progressing   Problem: Coping: Goal: Level of anxiety will decrease Outcome: Progressing   Problem: Elimination: Goal: Will not experience complications related to bowel motility Outcome: Progressing Goal: Will not experience complications related to urinary retention Outcome: Progressing   Problem: Pain Management: Goal: General experience of comfort will improve Outcome: Progressing   Problem: Safety: Goal: Ability to remain free from injury will improve Outcome: Progressing   Problem: Skin Integrity: Goal: Risk for impaired skin integrity will decrease Outcome: Progressing   Problem: Activity: Goal: Ability to tolerate increased activity will improve Outcome: Progressing   Problem: Cardiac: Goal: Ability to achieve and maintain adequate cardiopulmonary perfusion will improve Outcome: Progressing

## 2023-05-16 NOTE — Interval H&P Note (Signed)
History and Physical Interval Note:  05/16/2023 1:11 PM  Peggy Armstrong  has presented today for surgery, with the diagnosis of anemia, melena.  The various methods of treatment have been discussed with the patient and family. After consideration of risks, benefits and other options for treatment, the patient has consented to  Procedure(s): ESOPHAGOGASTRODUODENOSCOPY (EGD) WITH PROPOFOL (N/A) as a surgical intervention.  The patient's history has been reviewed, patient examined, no change in status, stable for surgery.  I have reviewed the patient's chart and labs.  Questions were answered to the patient's satisfaction.     Venita Lick. Russella Dar

## 2023-05-16 NOTE — Anesthesia Preprocedure Evaluation (Addendum)
Anesthesia Evaluation  Patient identified by MRN, date of birth, ID band Patient awake    Reviewed: Allergy & Precautions, H&P , NPO status , Patient's Chart, lab work & pertinent test results  Airway Mallampati: III  TM Distance: >3 FB Neck ROM: Full    Dental no notable dental hx. (+) Edentulous Upper, Edentulous Lower, Dental Advisory Given   Pulmonary neg pulmonary ROS   Pulmonary exam normal breath sounds clear to auscultation       Cardiovascular hypertension, Pt. on medications +CHF  + dysrhythmias Atrial Fibrillation  Rhythm:Regular Rate:Normal     Neuro/Psych   Anxiety     negative neurological ROS     GI/Hepatic Neg liver ROS, PUD,GERD  Medicated,,  Endo/Other  Hypothyroidism  Class 3 obesity  Renal/GU negative Renal ROS  negative genitourinary   Musculoskeletal  (+) Arthritis , Rheumatoid disorders,    Abdominal   Peds  Hematology  (+) Blood dyscrasia, anemia   Anesthesia Other Findings   Reproductive/Obstetrics negative OB ROS                             Anesthesia Physical Anesthesia Plan  ASA: 3  Anesthesia Plan: MAC   Post-op Pain Management: Minimal or no pain anticipated   Induction: Intravenous  PONV Risk Score and Plan: 2 and Propofol infusion and Treatment may vary due to age or medical condition  Airway Management Planned: Simple Face Mask and Natural Airway  Additional Equipment:   Intra-op Plan:   Post-operative Plan:   Informed Consent: I have reviewed the patients History and Physical, chart, labs and discussed the procedure including the risks, benefits and alternatives for the proposed anesthesia with the patient or authorized representative who has indicated his/her understanding and acceptance.     Dental advisory given  Plan Discussed with: CRNA  Anesthesia Plan Comments:        Anesthesia Quick Evaluation

## 2023-05-16 NOTE — Transfer of Care (Signed)
Immediate Anesthesia Transfer of Care Note  Patient: [name]  Procedure(s) Performed: ESOPHAGOGASTRODUODENOSCOPY (EGD) WITH PROPOFOL BIOPSY  Patient Location: PACU and Endoscopy Unit  Anesthesia Type:MAC  Level of Consciousness: drowsy and patient cooperative  Airway & Oxygen Therapy: Patient Spontanous Breathing and Patient connected to face mask oxygen  Post-op Assessment: Report given to RN and Post -op Vital signs reviewed and stable  Post vital signs: Reviewed and stable  Last Vitals:  Vitals Value Taken Time  BP 101/35 05/16/23 1357  Temp    Pulse 70 05/16/23 1400  Resp 31 05/16/23 1400  SpO2 100 % 05/16/23 1400  Vitals shown include unfiled device data.  Last Pain:  Vitals:   05/16/23 1220  TempSrc: Tympanic  PainSc: 0-No pain      Patients Stated Pain Goal: 0 (05/15/23 2200)  Complications: No notable events documented.

## 2023-05-16 NOTE — Progress Notes (Signed)
OT Cancellation Note  Patient Details Name: Peggy Armstrong MRN: [id number] DOB: [birth year]   Cancelled Treatment:    Reason Eval/Treat Not Completed: Patient at procedure or test/ unavailable Patient is off hall at procedure. OT to continue to follow and check back on 11/20 [facility] OTR/L, MS Acute Rehabilitation Department Office# [phone] 05/16/2023, 12:19 PM

## 2023-05-16 NOTE — Anesthesia Postprocedure Evaluation (Signed)
Anesthesia Post Note  Patient: Peggy Armstrong  Procedure(s) Performed: ESOPHAGOGASTRODUODENOSCOPY (EGD) WITH PROPOFOL BIOPSY     Patient location during evaluation: Endoscopy Anesthesia Type: MAC Level of consciousness: awake and alert Pain management: pain level controlled Vital Signs Assessment: post-procedure vital signs reviewed and stable Respiratory status: spontaneous breathing, nonlabored ventilation, respiratory function stable and patient connected to nasal cannula oxygen Cardiovascular status: stable and blood pressure returned to baseline Postop Assessment: no apparent nausea or vomiting Anesthetic complications: no  No notable events documented.  Last Vitals:  Vitals:   05/16/23 1430 05/16/23 1440  BP: (!) 101/50 103/65  Pulse: 67 69  Resp: 16 15  Temp:    SpO2: 97% 96%    Last Pain:  Vitals:   05/16/23 1440  TempSrc:   PainSc: 0-No pain                 Toris Laverdiere,W. EDMOND

## 2023-05-16 NOTE — Plan of Care (Signed)
  Problem: Education: Goal: Knowledge of General Education information will improve Description: Including pain rating scale, medication(s)/side effects and non-pharmacologic comfort measures Outcome: Progressing   Problem: Health Behavior/Discharge Planning: Goal: Ability to manage health-related needs will improve Outcome: Progressing   Problem: Clinical Measurements: Goal: Will remain free from infection Outcome: Progressing Goal: Diagnostic test results will improve Outcome: Progressing Goal: Respiratory complications will improve Outcome: Progressing   Problem: Nutrition: Goal: Adequate nutrition will be maintained Outcome: Progressing   Problem: Elimination: Goal: Will not experience complications related to bowel motility Outcome: Progressing Goal: Will not experience complications related to urinary retention Outcome: Progressing   Problem: Pain Management: Goal: General experience of comfort will improve Outcome: Progressing   Problem: Safety: Goal: Ability to remain free from injury will improve Outcome: Progressing   Problem: Activity: Goal: Ability to tolerate increased activity will improve Outcome: Progressing   Problem: Cardiac: Goal: Ability to achieve and maintain adequate cardiopulmonary perfusion will improve Outcome: Progressing

## 2023-05-16 NOTE — Progress Notes (Addendum)
   Patient Name: Peggy Armstrong Date of Encounter: 05/16/2023, 11:05 AM Primary Gastroenterologist:  Dr. Christella Hartigan       Complaint:   Anemia, melenic stools    Subjective   No family at bedside. Patient still on 2 L nasal cannula but states her breathing has improved significantly, lungs are clear to auscultation bilaterally. Did have black stool last night. Biggest complaint is leg pain and from the oxygen she is having nosebleeds and states the ears to dry. Denies nausea, vomiting, abdominal pain.  No chest pain.   Objective  BP (!) 109/49 (BP Location: Right Wrist)   Pulse (!) 55   Temp 98 F (36.7 C) (Oral)   Resp 16   Ht 5' (1.524 m)   Wt 92.6 kg   SpO2 100%   BMI 39.87 kg/m   General:   Chronically ill-appearing female in no acute distress Head:  Normocephalic and atraumatic. Heart: Irregular regular rhythm no murmur Pulm: CTAB; no wheezing, on 2 L nasal cannula Abdomen:  Soft, Obese AB, Active bowel sounds. No tenderness Extremities:  With edema trace, bilateral ecchymoses Msk:  Symmetrical without gross deformities. Peripheral pulses intact.  Neurologic:  Alert and  oriented x4;  No focal deficits.  No clonus or asterixis, mild left nonintention tremor. Psychiatric:  Cooperative. Normal mood and affect.   Assessment and Plan   Acute on chronic anemia, FOBT positive, on Eliquis, questionable melenic stools but has had since being on oral iron 05/15/2023  HGB 8.2 MCV 103.5 Platelets 142--> 05/16/2023  HGB 9.4 MCV 105.4 Platelets 145 05/12/2023 Iron 20 Ferritin 277 B12 1,052, folate 7.7 No blood transfusion history, increase in hemoglobin. 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  However  still with darker stools and another dark stool last night, moderate hiatal hernia, previous cancer of ampulla will proceed with upper endoscopy today with Dr. Russella Dar. -continue NPO - hold eliquid - monitor CBC, transfuse less than 7 -Patient remains on oxygen however lungs are clear to auscultation and discussed with hospitalist will DC oxygen today, patient should be appropriate for endoscopic valuation today and wishes to proceed..   Thrombocytopenia Fatty liver seen 2019, mild splenomegaly on current CT noncontrast RUQ [place] with fatty liver filtration mild, previous cholecystectomy no ductal dilation -Proceed with endoscopy evaluate for portal hypertension if this is unremarkable most likely thrombocytopenia bone marrow/nutritional related and continue following with [facility] hematology   Elevated LFTs Patient with known fatty liver, on amiodarone Continue to monitor LFTs   History of atrial fibrillation On Eliquis, currently on hold   Urinary tract infection E. Coli On cefadroxil    Morbid obesity BMI greater than 50   Close compression fracture L2 Getting PT/OT/supportive care Will follow-up with neurosurgery outpatient   Chronic diastolic heart failure/AAA Monitor fluid status, per medicine  Quentin Mulling, PA-C   Attending Physician Note   I have taken an interval history, reviewed the chart and examined the patient. I performed a substantive portion of this encounter, including complete performance of at least one of the key components, in conjunction with the APP. I agree with the APP's note, impression and recommendations.   Claudette Head, MD [facility], [facility], for our on call provider

## 2023-05-16 NOTE — Anesthesia Procedure Notes (Signed)
Procedure Name: MAC Date/Time: 05/16/2023 1:27 PM  Performed by: Sindy Guadeloupe, CRNAPre-anesthesia Checklist: Patient identified, Emergency Drugs available, Suction available, Patient being monitored and Timeout performed Oxygen Delivery Method: Simple face mask Placement Confirmation: positive ETCO2

## 2023-05-16 NOTE — H&P (View-Only) (Signed)
   Patient Name: Peggy Armstrong Date of Encounter: 05/16/2023, 11:05 AM Primary Gastroenterologist:  Dr. Christella Hartigan       Complaint:   Anemia, melenic stools    Subjective   No family at bedside. Patient still on 2 L nasal cannula but states her breathing has improved significantly, lungs are clear to auscultation bilaterally. Did have black stool last night. Biggest complaint is leg pain and from the oxygen she is having nosebleeds and states the ears to dry. Denies nausea, vomiting, abdominal pain.  No chest pain.   Objective  BP (!) 109/49 (BP Location: Right Wrist)   Pulse (!) 55   Temp 98 F (36.7 C) (Oral)   Resp 16   Ht 5' (1.524 m)   Wt 92.6 kg   SpO2 100%   BMI 39.87 kg/m   General:   Chronically ill-appearing female in no acute distress Head:  Normocephalic and atraumatic. Heart: Irregular regular rhythm no murmur Pulm: CTAB; no wheezing, on 2 L nasal cannula Abdomen:  Soft, Obese AB, Active bowel sounds. No tenderness Extremities:  With edema trace, bilateral ecchymoses Msk:  Symmetrical without gross deformities. Peripheral pulses intact.  Neurologic:  Alert and  oriented x4;  No focal deficits.  No clonus or asterixis, mild left nonintention tremor. Psychiatric:  Cooperative. Normal mood and affect.   Assessment and Plan   Acute on chronic anemia, FOBT positive, on Eliquis, questionable melenic stools but has had since being on oral iron 05/15/2023  HGB 8.2 MCV 103.5 Platelets 142--> 05/16/2023  HGB 9.4 MCV 105.4 Platelets 145 05/12/2023 Iron 20 Ferritin 277 B12 1,052, folate 7.7 No blood transfusion history, increase in hemoglobin. 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  However  still with darker stools and another dark stool last night, moderate hiatal hernia, previous cancer of ampulla will proceed with upper endoscopy today with Dr. Russella Dar. -continue NPO - hold eliquid - monitor CBC, transfuse less than 7 -Patient remains on oxygen however lungs are clear to auscultation and discussed with hospitalist will DC oxygen today, patient should be appropriate for endoscopic valuation today and wishes to proceed..   Thrombocytopenia Fatty liver seen 2019, mild splenomegaly on current CT noncontrast RUQ [place] with fatty liver filtration mild, previous cholecystectomy no ductal dilation -Proceed with endoscopy evaluate for portal hypertension if this is unremarkable most likely thrombocytopenia bone marrow/nutritional related and continue following with [facility] hematology   Elevated LFTs Patient with known fatty liver, on amiodarone Continue to monitor LFTs   History of atrial fibrillation On Eliquis, currently on hold   Urinary tract infection E. Coli On cefadroxil    Morbid obesity BMI greater than 50   Close compression fracture L2 Getting PT/OT/supportive care Will follow-up with neurosurgery outpatient   Chronic diastolic heart failure/AAA Monitor fluid status, per medicine  Quentin Mulling, PA-C   Attending Physician Note   I have taken an interval history, reviewed the chart and examined the patient. I performed a substantive portion of this encounter, including complete performance of at least one of the key components, in conjunction with the APP. I agree with the APP's note, impression and recommendations.   Claudette Head, MD [facility], [facility], for our on call provider

## 2023-05-17 ENCOUNTER — Telehealth: Payer: Self-pay | Admitting: Nurse Practitioner

## 2023-05-17 DIAGNOSIS — E876 Hypokalemia: Secondary | ICD-10-CM | POA: Diagnosis not present

## 2023-05-17 DIAGNOSIS — I10 Essential (primary) hypertension: Secondary | ICD-10-CM | POA: Diagnosis not present

## 2023-05-17 DIAGNOSIS — K2100 Gastro-esophageal reflux disease with esophagitis, without bleeding: Secondary | ICD-10-CM | POA: Diagnosis not present

## 2023-05-17 DIAGNOSIS — D500 Iron deficiency anemia secondary to blood loss (chronic): Secondary | ICD-10-CM | POA: Diagnosis not present

## 2023-05-17 DIAGNOSIS — S32020A Wedge compression fracture of second lumbar vertebra, initial encounter for closed fracture: Secondary | ICD-10-CM | POA: Diagnosis not present

## 2023-05-17 DIAGNOSIS — K3189 Other diseases of stomach and duodenum: Secondary | ICD-10-CM | POA: Diagnosis not present

## 2023-05-17 DIAGNOSIS — K317 Polyp of stomach and duodenum: Secondary | ICD-10-CM | POA: Diagnosis not present

## 2023-05-17 LAB — CBC WITH DIFFERENTIAL/PLATELET
Abs Immature Granulocytes: 0.04 10*3/uL (ref 0.00–0.07)
Basophils Absolute: 0 10*3/uL (ref 0.0–0.1)
Basophils Relative: 0 %
Eosinophils Absolute: 0.1 10*3/uL (ref 0.0–0.5)
Eosinophils Relative: 2 %
HCT: 27.9 % — ABNORMAL LOW (ref 36.0–46.0)
Hemoglobin: 8.3 g/dL — ABNORMAL LOW (ref 12.0–15.0)
Immature Granulocytes: 1 %
Lymphocytes Relative: 8 %
Lymphs Abs: 0.5 10*3/uL — ABNORMAL LOW (ref 0.7–4.0)
MCH: 31.1 pg (ref 26.0–34.0)
MCHC: 29.7 g/dL — ABNORMAL LOW (ref 30.0–36.0)
MCV: 104.5 fL — ABNORMAL HIGH (ref 80.0–100.0)
Monocytes Absolute: 0.4 10*3/uL (ref 0.1–1.0)
Monocytes Relative: 7 %
Neutro Abs: 4.8 10*3/uL (ref 1.7–7.7)
Neutrophils Relative %: 82 %
Platelets: 151 10*3/uL (ref 150–400)
RBC: 2.67 MIL/uL — ABNORMAL LOW (ref 3.87–5.11)
RDW: 15.4 % (ref 11.5–15.5)
WBC: 5.8 10*3/uL (ref 4.0–10.5)
nRBC: 0 % (ref 0.0–0.2)

## 2023-05-17 LAB — COMPREHENSIVE METABOLIC PANEL
ALT: 49 U/L — ABNORMAL HIGH (ref 0–44)
AST: 61 U/L — ABNORMAL HIGH (ref 15–41)
Albumin: 2.7 g/dL — ABNORMAL LOW (ref 3.5–5.0)
Alkaline Phosphatase: 119 U/L (ref 38–126)
Anion gap: 8 (ref 5–15)
BUN: 38 mg/dL — ABNORMAL HIGH (ref 8–23)
CO2: 30 mmol/L (ref 22–32)
Calcium: 8.3 mg/dL — ABNORMAL LOW (ref 8.9–10.3)
Chloride: 99 mmol/L (ref 98–111)
Creatinine, Ser: 1.04 mg/dL — ABNORMAL HIGH (ref 0.44–1.00)
GFR, Estimated: 55 mL/min — ABNORMAL LOW (ref >60)
Glucose, Bld: 109 mg/dL — ABNORMAL HIGH (ref 70–99)
Potassium: 3.3 mmol/L — ABNORMAL LOW (ref 3.5–5.1)
Sodium: 137 mmol/L (ref 135–145)
Total Bilirubin: 0.4 mg/dL (ref <1.2)
Total Protein: 5.5 g/dL — ABNORMAL LOW (ref 6.5–8.1)

## 2023-05-17 LAB — MAGNESIUM: Magnesium: 1.7 mg/dL (ref 1.7–2.4)

## 2023-05-17 LAB — SURGICAL PATHOLOGY

## 2023-05-17 MED ORDER — PANTOPRAZOLE SODIUM 40 MG PO TBEC
40.0000 mg | DELAYED_RELEASE_TABLET | Freq: Two times a day (BID) | ORAL | Status: DC
  Administered 2023-05-17 – 2023-05-18 (×2): 40 mg via ORAL
  Filled 2023-05-17 (×2): qty 1

## 2023-05-17 MED ORDER — CAMPHOR-MENTHOL 0.5-0.5 % EX LOTN
TOPICAL_LOTION | CUTANEOUS | Status: DC | PRN
  Filled 2023-05-17 (×2): qty 222

## 2023-05-17 MED ORDER — HYDROXYZINE HCL 10 MG PO TABS
10.0000 mg | ORAL_TABLET | Freq: Once | ORAL | Status: AC
  Administered 2023-05-17: 10 mg via ORAL
  Filled 2023-05-17: qty 1

## 2023-05-17 MED ORDER — POTASSIUM CHLORIDE CRYS ER 20 MEQ PO TBCR
40.0000 meq | EXTENDED_RELEASE_TABLET | Freq: Three times a day (TID) | ORAL | Status: AC
Start: 2023-05-17 — End: 2023-05-17
  Administered 2023-05-17 (×2): 40 meq via ORAL
  Filled 2023-05-17 (×2): qty 2

## 2023-05-17 NOTE — Telephone Encounter (Signed)
Peggy Armstrong, patient is currently admitted to [facility], will likely be discharged home soon. 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 Pls facilitate an urgent  referral to  Dr. 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 THX.

## 2023-05-17 NOTE — TOC Progression Note (Addendum)
Transition of Care [facility]) - Progression Note    Patient Details  Name: Peggy Armstrong MRN: [id number] Date of Birth: 04-29-1946  Transition of Care [facility]) CM/SW Contact  Otelia Santee, LCSW Phone Number: 05/17/2023, 9:20 AM  Clinical Narrative:    Pt's insurance has been approved for SNF placement at [facility]. Pt able to transfer when medically ready. Insurance auth valid from 11/20 to 11/22.    Expected Discharge Plan: Skilled Nursing Facility Barriers to Discharge: Continued Medical Work up  Expected Discharge Plan and Services     Post Acute Care Choice: Skilled Nursing Facility Living arrangements for the past 2 months: Apartment                                       Social Determinants of Health (SDOH) Interventions SDOH Screenings   Food Insecurity: No Food Insecurity (05/12/2023)  Housing: Low Risk  (05/12/2023)  Transportation Needs: No Transportation Needs (05/12/2023)  Utilities: Not At Risk (05/12/2023)  Financial Resource Strain: Low Risk  (12/02/2022)   Received from [facility]  Physical Activity: Unknown (04/05/2022)   Received from [facility], [facility]  Social Connections: Unknown (04/06/2023)   Received from [facility]  Stress: No Stress Concern Present (04/05/2022)   Received from [facility], [facility]  Tobacco Use: Low Risk  (05/16/2023)  Recent Concern: Tobacco Use - Medium Risk (05/04/2023)   Received from [facility]    Readmission Risk Interventions    05/14/2023    6:44 PM  Readmission Risk Prevention Plan  Transportation Screening Complete  PCP or Specialist Appt within 3-5 Days Complete  HRI or Home Care Consult Complete  Social Work Consult for Recovery Care Planning/Counseling Complete  Palliative Care Screening Complete  Medication Review Oceanographer) Referral to Pharmacy

## 2023-05-17 NOTE — Plan of Care (Signed)
  Problem: Education: Goal: Knowledge of General Education information will improve Description: Including pain rating scale, medication(s)/side effects and non-pharmacologic comfort measures Outcome: Progressing   Problem: Health Behavior/Discharge Planning: Goal: Ability to manage health-related needs will improve Outcome: Progressing   Problem: Clinical Measurements: Goal: Ability to maintain clinical measurements within normal limits will improve Outcome: Progressing Goal: Will remain free from infection Outcome: Progressing Goal: Diagnostic test results will improve Outcome: Progressing Goal: Respiratory complications will improve Outcome: Progressing Goal: Cardiovascular complication will be avoided Outcome: Progressing   Problem: Activity: Goal: Risk for activity intolerance will decrease Outcome: Progressing   Problem: Nutrition: Goal: Adequate nutrition will be maintained Outcome: Progressing   Problem: Coping: Goal: Level of anxiety will decrease Outcome: Progressing   Problem: Elimination: Goal: Will not experience complications related to bowel motility Outcome: Progressing Goal: Will not experience complications related to urinary retention Outcome: Progressing   Problem: Pain Management: Goal: General experience of comfort will improve Outcome: Progressing   Problem: Safety: Goal: Ability to remain free from injury will improve Outcome: Progressing   Problem: Skin Integrity: Goal: Risk for impaired skin integrity will decrease Outcome: Progressing   Problem: Activity: Goal: Ability to tolerate increased activity will improve Outcome: Progressing   Problem: Cardiac: Goal: Ability to achieve and maintain adequate cardiopulmonary perfusion will improve Outcome: Progressing

## 2023-05-17 NOTE — Progress Notes (Signed)
Physical Therapy Treatment Patient Details Name: Peggy Armstrong MRN: [id number] DOB: Jan 24, 1946 Today's Date: 05/17/2023   History of Present Illness Patient is a 77 yo female presenting to the ED for L knee and hip pain and minimal low back pain for the last 5 days on 05/11/23. Found to have burst L2 fracture with CT. Neurosurgery recommended TLSO and conservative management. PMH includes: essential pretension, paroxysmal atrial fibrillation chronically anticoagulated on Eliquis, hyperlipidemia, chronic diastolic heart failure    PT Comments  Pt reports wanting to try PT session with just Tylenol for pain.  She was able to tolerate transfer and step pivot to chair with mod A (+2 for safety) but had increased pain and not able to go further.  Required increased time and cues for relaxation and deep breaths with transfers.   Was able to tolerate on RA with sats >93% when OOB - notified RN pt left on RA.  Cont POC with recommendation Patient will benefit from continued inpatient follow up therapy, <3 hours/day at d/c.    If plan is discharge home, recommend the following: A lot of help with walking and/or transfers;A lot of help with bathing/dressing/bathroom   Can travel by private vehicle     Yes  Equipment Recommendations  Wheelchair cushion (measurements PT);Wheelchair (measurements PT)    Recommendations for Other Services       Precautions / Restrictions Precautions Precautions: Fall;Back Precaution Booklet Issued: Yes (comment) Precaution Comments: reviewed and provided handout Required Braces or Orthoses: Spinal Brace Spinal Brace: Thoracolumbosacral orthotic;Applied in sitting position     Mobility  Bed Mobility Overal bed mobility: Needs Assistance Bed Mobility: Rolling, Sidelying to Sit Rolling: Mod assist Sidelying to sit: Mod assist       General bed mobility comments: Cues for log roll, pt getting legs off bed then mod A to lift trunk    Transfers Overall  transfer level: Needs assistance Equipment used: Rolling walker (2 wheels) Transfers: Sit to/from Stand, Bed to chair/wheelchair/BSC Sit to Stand: Mod assist, +2 safety/equipment   Step pivot transfers: +2 safety/equipment, Min assist       General transfer comment: STS Performed x 2, cues for hand placement, mod A to rise; Min A to steady and provide cues for step pivot to chair    Ambulation/Gait               General Gait Details: only tolerated step pivot   Stairs             Wheelchair Mobility     Tilt Bed    Modified Rankin (Stroke Patients Only)       Balance Overall balance assessment: Needs assistance Sitting-balance support: Feet supported, No upper extremity supported Sitting balance-Leahy Scale: Fair     Standing balance support: Reliant on assistive device for balance, Bilateral upper extremity supported Standing balance-Leahy Scale: Poor Standing balance comment: RW and miN A                            Cognition Arousal: Alert Behavior During Therapy: WFL for tasks assessed/performed Overall Cognitive Status: Within Functional Limits for tasks assessed                                 General Comments: Educated on back precautions - needs reinforcement        Exercises      General Comments  General comments (skin integrity, edema, etc.): Pt on 2 L O2 with sats 99%.  Tried RA and sats maintained 94% with activity.  Left on RA (checked back 20 min later still 93% on RA in chair), notified RN on RA.      Pertinent Vitals/Pain Pain Assessment Pain Assessment: 0-10 Pain Score:  (2/10 rest; 6/10 when up) Pain Location: Low back Pain Descriptors / Indicators: Sharp, Discomfort Pain Intervention(s): Limited activity within patient's tolerance, Monitored during session, Premedicated before session (pt had tylenol prior - reports wanting to try without heavier duty pain med)    Home Living                           Prior Function            PT Goals (current goals can now be found in the care plan section) Progress towards PT goals: Progressing toward goals    Frequency    Min 1X/week      PT Plan      Co-evaluation              AM-PAC PT "6 Clicks" Mobility   Outcome Measure  Help needed turning from your back to your side while in a flat bed without using bedrails?: A Lot Help needed moving from lying on your back to sitting on the side of a flat bed without using bedrails?: A Lot Help needed moving to and from a bed to a chair (including a wheelchair)?: A Lot Help needed standing up from a chair using your arms (e.g., wheelchair or bedside chair)?: A Lot Help needed to walk in hospital room?: Total Help needed climbing 3-5 steps with a railing? : Total 6 Click Score: 10    End of Session Equipment Utilized During Treatment: Gait belt Activity Tolerance: Patient limited by pain Patient left: with chair alarm set;in chair;with call bell/phone within reach Nurse Communication: Mobility status PT Visit Diagnosis: Other abnormalities of gait and mobility (R26.89);Muscle weakness (generalized) (M62.81);Pain     Time: 0981-1914 PT Time Calculation (min) (ACUTE ONLY): 22 min  Charges:    $Therapeutic Activity: 8-22 mins PT General Charges $$ ACUTE PT VISIT: 1 Visit                     Anise Salvo, PT Acute Rehab [facility] [phone]    Rayetta Humphrey 05/17/2023, 12:10 PM

## 2023-05-17 NOTE — Plan of Care (Signed)
  Problem: Education: Goal: Knowledge of General Education information will improve Description: Including pain rating scale, medication(s)/side effects and non-pharmacologic comfort measures Outcome: Progressing   Problem: Clinical Measurements: Goal: Ability to maintain clinical measurements within normal limits will improve Outcome: Progressing Goal: Will remain free from infection Outcome: Progressing Goal: Diagnostic test results will improve Outcome: Progressing Goal: Respiratory complications will improve Outcome: Progressing Goal: Cardiovascular complication will be avoided Outcome: Progressing   Problem: Activity: Goal: Risk for activity intolerance will decrease Outcome: Progressing   Problem: Nutrition: Goal: Adequate nutrition will be maintained Outcome: Progressing   Problem: Coping: Goal: Level of anxiety will decrease Outcome: Progressing   Problem: Elimination: Goal: Will not experience complications related to bowel motility Outcome: Progressing Goal: Will not experience complications related to urinary retention Outcome: Progressing   Problem: Safety: Goal: Ability to remain free from injury will improve Outcome: Progressing   Problem: Skin Integrity: Goal: Risk for impaired skin integrity will decrease Outcome: Progressing   Problem: Activity: Goal: Ability to tolerate increased activity will improve Outcome: Progressing

## 2023-05-17 NOTE — Progress Notes (Addendum)
[facility] Progress Note  CC:   Anemia, melenic stools   Subjective: She ate a small amount of scrambled eggs, bacon and yogurt this morning.  No nausea or vomiting.  No abdominal pain.  She endorsed passing a large bowel movement yesterday evening, she did not look at the stool but stated her stools have consistently been dark.  No chest pain or shortness of breath.  No family at the bedside.   Objective:  EGD 05/16/2023: - LA Grade B reflux esophagitis with no bleeding.  - Medium-sized hiatal hernia.  - Gastroesophageal flap valve classified as Hill Grade IV (no fold, wide open lumen, hiatal hernia present).  - Multiple gastric polyps. Two were bleeding. Resected and retrieved.  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  Vital signs in last 24 hours: Temp:  [97.9 F (36.6 C)-98.6 F (37 C)] 98.1 F (36.7 C) (11/20 0445) Pulse Rate:  [55-72] 55 (11/20 0445) Resp:  [15-25] 16 (11/20 0445) BP: (94-138)/(35-86) 123/52 (11/20 0445) SpO2:  [91 %-100 %] 100 % (11/20 0445) Weight:  [88.2 kg-92.6 kg] 88.2 kg (11/20 0445) Last BM Date : 05/16/23 General: 77 year old female chronically ill-appearing in no acute distress. Heart: Regular rate and rhythm, no murmurs. Pulm: Breath sounds clear throughout.  On oxygen 2 L nasal cannula. Abdomen: Soft, nondistended.  Nontender.  Positive bowel sounds to all 4 quadrants.  No bruit.  No palpable mass. Extremities: No lower extremity edema. Neurologic:  Alert and  oriented x 4.  Speech is clear.  Moves all extremities. Psych:  Alert and cooperative. Normal mood and affect.  Intake/Output from previous day: 11/19 0701 - 11/20 0700 In: 175 [I.V.:175] Out: 750 [Urine:750] Intake/Output this shift: No intake/output data recorded.  Lab Results: Recent Labs    05/15/23 1059 05/16/23 0535 05/17/23 0537  WBC 5.7 4.3 5.8  HGB 8.2* 9.4* 8.3*  HCT 26.7* 31.1* 27.9*  PLT 142* 145* 151   BMET Recent Labs     05/15/23 0327 05/16/23 0535 05/17/23 0537  NA 138 135 137  K 3.3* 3.7 3.3*  CL 102 99 99  CO2 28 28 30   GLUCOSE 100* 83 109*  BUN 42* 33* 38*  CREATININE 1.07* 0.99 1.04*  CALCIUM 8.1* 8.5* 8.3*   LFT Recent Labs    05/14/23 1313 05/17/23 0537  PROT 5.2* 5.5*  ALBUMIN 2.4* 2.7*  AST 58* 61*  ALT 49* 49*  ALKPHOS 107 119  BILITOT 0.4 0.4  BILIDIR 0.1  --   IBILI 0.3  --    PT/INR Recent Labs    05/14/23 1313  LABPROT 20.8*  INR 1.8*   Hepatitis Panel No results for input(s): "HEPBSAG", "HCVAB", "HEPAIGM", "HEPBIGM" in the last 72 hours.  US ABDOMEN LIMITED RUQ (LIVER/GB)  Result Date: 05/15/2023 CLINICAL DATA:  Elevated liver function tests EXAM: ULTRASOUND ABDOMEN LIMITED RIGHT UPPER QUADRANT COMPARISON:  CT 05/15/2023. FINDINGS: Gallbladder: Previous cholecystectomy. Common bile duct: Diameter: 5 mm Liver: Mildly echogenic hepatic parenchyma consistent with fatty liver infiltration. Portal vein is patent on color Doppler imaging with normal direction of blood flow towards the liver. Other: None. IMPRESSION: Previous cholecystectomy. No ductal dilatation. Fatty liver infiltration. Electronically Signed   By: Karen Kays M.D.   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 Jamse Mead  [facility]  who was admitted to [facility] on 05/11/2023 with lower back pain secondary to L2 burst fracture.  Hemoglobin dropped to 8.8 down to 7.3 with associated melenic stools.  Assessment / Plan:  Acute on chronic anemia with melena. Positive FOBT.  On Eliquis and oral iron. Admission hemoglobin 8.8 -> 7.3 on 11/17 -> 8.7 -> 7.7 -> 9.4 -> today Hg 8.3. CTAP without contrast 05/04/1013 showed a moderate hiatal  hernia/intrathoracic stomach without evidence of a retroperitoneal bleed. EGD 05/16/2023 showed reflux esophagitis, a medium hiatal hernia, multiple gastric polyps, two gastric polyps were actively bleeding and resected, and a malignant appearing duodenal mass. -Await pathology results   - Resume Eliquis (apixaban) at prior dose no sooner than 3 days post EGD. Refer to managing physician for further adjustment of therapy. Risk of persisent, recurrent bleeding wtih dudoenal mass, gastric polyps.  - Discuss outpatient follow up with Dr. Corliss Parish, [facility], for managment of duodenal mass -CBC in am -Continue to monitor the patient closely for active GI bleeding -Continue Pantoprazole 40 mg IV twice daily -Continue Famotidine 20 mg po QD -Continue soft diet -Pain management and IV fluids per the hospitalist -Further recommendations per Dr. Russella Dar  Thrombocytopenia. CTAP without contrast 05/15/2023 showed borderline splenomegaly with a normal liver.  Continue following with [facility] hematology   Elevated LFTs. Normal T.  Bili and Alk phos levels. AST 58 -> 61. ALT 49. RUQ sono 05/15/2023 showed post cholecystectomy physiology without ductal dilatation, hepatic steatosis noted and the portal vein was patent. CTAP without contrast 05/15/2023 showed a normal liver.  Patient with known fatty liver, on Amiodarone Continue to monitor LFTs  Hypokalemia. K+ 3.3. -KCl replacement per the hospitalist   History of atrial fibrillation On Eliquis, currently on hold   Urinary tract infection E. Coli On Cefadroxil    Morbid obesity BMI greater than 50   Close compression fracture L2 Getting PT/OT/supportive care Will follow-up with neurosurgery outpatient   Chronic diastolic heart failure/AAA Monitor fluid status, per medicine    Principal Problem:   Closed compression fracture of L2 lumbar vertebra, initial encounter [facility] Active Problems:   Essential hypertension   Hypokalemia   Macrocytic anemia    Chronic diastolic CHF (congestive heart failure) (HCC)   Generalized weakness   Acute cystitis   Dehydration   Acute prerenal azotemia   Paroxysmal atrial fibrillation (HCC)   AAA (abdominal aortic aneurysm) (HCC)   Hyperlipidemia   GAD (generalized anxiety disorder)   Acquired hypothyroidism   Occult blood in stools   Gastric polyps    LOS: 6 days   Peggy Armstrong  05/17/2023, 9:35AM  Attending Physician Note   I have taken an interval history, reviewed the chart and examined the patient. I performed a substantive portion of this encounter, including complete performance of at least one of the key components, in conjunction with the APP. I agree with the APP's note, impression and recommendations with my edits. My additional impressions and recommendations are as follows.   Large duodenal adenoma. R/O carcinoma within the large adenoma. Duodenal lesion was not noted on CT AP. Outpatient referral to Dr. Corliss Parish, [facility].   Hyperplastic gastric polyps, oozing at EGD, removed. Would wait at least 2 more days before resuming Eliquis as indicated.   IDA. Supplement Fe. Follow up with PCP.  GERD, LA Grade B esophagitis, moderate HH. Pantoprazole 40 mg bid for 1 month then 40 mg qd and continue famotidine 20 mg hs long term.  GI  signing off. Outpatient GI follow up with Dr. Adela Lank.   Claudette Head, MD [facility] See AMION, [facility], for our on call provider

## 2023-05-17 NOTE — Progress Notes (Signed)
[facility] PROGRESS NOTE   NIEKA BLAES ZOX:[id number] DOB: [birth year] DOA: 05/11/2023  PCP: Wilfred Curtis, MD  Brief History: PMH of HTN, atrial flutter/chronic A-fib on Eliquis, HLD, chronic HFpEF, GERD, DVT 2017.  Presented with 5-day history of left hip pain and generalized fatigue.  Apparently also mention of back pain.  Was found to have a L2 burst fracture.  Was hospitalized for pain control.  Subsequently developed anemia with heme positive stools.  Underwent GI evaluation.  Consultants: Gastroenterology  Procedures: Upper endoscopy    Subjective/Interval History: Patient mentions that her back pain is better.  Denies any abdominal pain.  Still has a lot of difficulty ambulating.    Assessment/Plan:  Subacute L2 burst fracture. Acute insufficiency fracture of bilateral sacral alla Subacute right 10, 11, 12 rib fracture Etiology of the fracture is not clear. Concern for pathological fracture of per report. 6 mm retropulsion seen. ED provider discussed with Dr. Conchita Paris who recommended conservative measures.  No indication to transfer to [facility].  TLSO brace pain control and PT OT. Outpatient follow-up with neurosurgery recommended. Patient was given pain medications.  No obvious neurological deficits noted but significant physical deconditioning noted.  Seen by PT and OT.  Short-term rehab was recommended. Pain seems to be improving.  Continue PT and OT.   L5-S1 disc protrusion. Possibly causing nerve root irritation on left S1 although currently asymptomatic.   E. coli UTI. Acute cystitis. Possible frontal sinusitis Received IV ceftriaxone and then changed over to cefadroxil for 5 days.     Acute blood loss anemia chronic iron deficiency anemia. Bleeding gastric polyps.  Esophagitis.  Hiatal hernia.   Duodenal mass. Patient sees hematology outpatient for chronic iron deficiency anemia.  Prior workup for myeloma has been negative. Iron  transfusion done on at 10/18. Iron level actually lower than what it was at the time of October evaluation when she actually received IV iron therapy. Given IV folic acid.  Will give IV iron as well. Eliquis was placed on hold. Gastroenterology was consulted Concern for duodenal mass.  Pathology is pending.  Currently on PPI.  Remains intravenous.  Await further GI input.  The patient apparently has oncology providers in [facility] with whom she will need to follow-up.  Discussed in detail with patient and daughter this morning.  Patient will need to be stronger before any further intervention such as surgery or chemotherapy can be considered. Resumption of Eliquis per gastroenterology.   Hypothyroidism. Continuing Synthroid.   GERD. Continuing Pepcid and PPI.   Mood disorder. On Zoloft.  BuSpar. Continue.   Chronic A-fib/atrial flutter. Currently rate controlled. On amiodarone, continue. Due to bradycardia I will be holding Cardizem. Also on Eliquis.  For now on hold due to bleeding.  Resumption of apixaban as per gastroenterology.   Essential hypertension/Chronic diastolic CHF. Home regimen includes Cardizem, Lasix, losartan. Currently losartan is on hold.  Cardizem was on hold for bradycardia. Lasix resumed.  Monitor.   Hypokalemia. Continue to supplement.  Magnesium 1.7.  Mild transaminitis Stable for the most part.  Noted to be on statin also on amiodarone.  These could be contributing.  As long as levels do not rise significantly we can continue to monitor while patient is on these medications.   Obesity Class 3 Body mass index is 44.78 kg/m.  Placing the pt at higher risk of poor outcomes.   Chronic HFpEF  Volume status is adequate. Holding diuretic.   HLD. On Lipitor, continue.  Jaw pain. Patient reports that the right jaw has been hurting severely limiting her ability to eat and chew. Given that she had multiple fractures which were identified incidentally a  CT scan of the maxillofacial area was also recommended to ensure that she does not have any fracture of the area as well as fracture of the head. Reassuringly both CT scans are negative.    DVT Prophylaxis: Apixaban on hold Code Status: Full code Family Communication: Discussed with patient and her daughter Disposition Plan: SNF when medically stable     Medications: Scheduled:  acetaminophen  1,000 mg Oral TID   amiodarone  200 mg Oral Daily   atorvastatin  20 mg Oral Daily   busPIRone  10 mg Oral BID   cefadroxil  500 mg Oral BID   famotidine  20 mg Oral QPM   feeding supplement  237 mL Oral TID BM   ferrous sulfate  325 mg Oral Q breakfast   folic acid  1 mg Oral Daily   furosemide  20 mg Oral Daily   levothyroxine  88 mcg Oral Q0600   lidocaine  2 patch Transdermal Q24H   pantoprazole (PROTONIX) IV  40 mg Intravenous Q12H   potassium chloride  40 mEq Oral TID   senna-docusate  1 tablet Oral BID   sertraline  25 mg Oral Daily   tiZANidine  1 mg Oral TID   Continuous: WGN:[id number] **OR** acetaminophen, albuterol, lip balm, melatonin, morphine injection, naLOXone (NARCAN)  injection, ondansetron (ZOFRAN) IV, mouth rinse, oxyCODONE  Antibiotics: Anti-infectives (From admission, onward)    Start     Dose/Rate Route Frequency Ordered Stop   05/14/23 1400  cefadroxil (DURICEF) capsule 500 mg        500 mg Oral 2 times daily 05/14/23 1240 05/19/23 0959   05/12/23 1900  cefTRIAXone (ROCEPHIN) 1 g in sodium chloride 0.9 % 100 mL IVPB  Status:  Discontinued        1 g 200 mL/hr over 30 Minutes Intravenous Every 24 hours 05/12/23 0001 05/14/23 1240   05/11/23 2130  cefTRIAXone (ROCEPHIN) 1 g in sodium chloride 0.9 % 100 mL IVPB        1 g 200 mL/hr over 30 Minutes Intravenous  Once 05/11/23 2118 05/11/23 2341       Objective:  Vital Signs  Vitals:   05/16/23 1500 05/16/23 1526 05/16/23 2159 05/17/23 0445  BP: (!) 113/57 117/75 118/60 (!) 123/52  Pulse: 66 67 71  (!) 55  Resp:  16 (!) 24 16  Temp:  98.6 F (37 C) 98.5 F (36.9 C) 98.1 F (36.7 C)  TempSrc:   Oral Oral  SpO2: 99% 100% 95% 100%  Weight:    88.2 kg  Height:        Intake/Output Summary (Last 24 hours) at 05/17/2023 1003 Last data filed at 05/17/2023 0447 Gross per 24 hour  Intake 175 ml  Output 750 ml  Net -575 ml   Filed Weights   05/16/23 0500 05/16/23 1220 05/17/23 0445  Weight: 92.6 kg 92.6 kg 88.2 kg    General appearance: Awake alert.  In no distress Resp: Clear to auscultation bilaterally.  Normal effort Cardio: S1-S2 is normal regular.  No S3-S4.  No rubs murmurs or bruit GI: Abdomen is soft.  Nontender nondistended.  Bowel sounds are present normal.  No masses organomegaly Extremities: No edema.  Significant limitation in movement of the lower extremities noted. Neurologic: Alert and oriented x3.  No focal neurological deficits.  Lab Results:  Data Reviewed: I have personally reviewed following labs and reports of the imaging studies  CBC: Recent Labs  Lab 05/12/23 0516 05/14/23 0553 05/15/23 0326 05/15/23 0537 05/15/23 1059 05/16/23 0535 05/17/23 0537  WBC 7.1   < > 5.2 5.3 5.7 4.3 5.8  NEUTROABS 5.9  --   --   --   --   --  4.8  HGB 8.5*   < > 7.7* 8.0* 8.2* 9.4* 8.3*  HCT 29.1*   < > 26.9* 26.8* 26.7* 31.1* 27.9*  MCV 107.0*   < > 105.9* 104.3* 103.5* 105.4* 104.5*  PLT 99*   < > 130* 135* 142* 145* 151   < > = values in this interval not displayed.    Basic Metabolic Panel: Recent Labs  Lab 05/12/23 0516 05/14/23 0553 05/15/23 0327 05/16/23 0535 05/17/23 0537  NA 136 138 138 135 137  K 3.6 3.9 3.3* 3.7 3.3*  CL 103 104 102 99 99  CO2 25 28 28 28 30   GLUCOSE 96 101* 100* 83 109*  BUN 30* 28* 42* 33* 38*  CREATININE 0.93 0.94 1.07* 0.99 1.04*  CALCIUM 8.7* 8.5* 8.1* 8.5* 8.3*  MG 1.9 1.7 1.7 1.7 1.7    GFR: Estimated Creatinine Clearance: 44.8 mL/min (A) (by C-G formula based on SCr of 1.04 mg/dL (H)).  Liver Function  Tests: Recent Labs  Lab 05/12/23 0516 05/14/23 1313 05/17/23 0537  AST 57* 58* 61*  ALT 57* 49* 49*  ALKPHOS 105 107 119  BILITOT 0.6 0.4 0.4  PROT 5.7* 5.2* 5.5*  ALBUMIN 2.9* 2.4* 2.7*     Coagulation Profile: Recent Labs  Lab 05/12/23 0516 05/14/23 1313  INR 1.7* 1.8*    Anemia Panel: Recent Labs    05/14/23 1313  RETICCTPCT 2.6    Recent Results (from the past 240 hour(s))  Culture, OB Urine     Status: Abnormal   Collection Time: 05/11/23  5:21 PM   Specimen: Urine, Clean Catch  Result Value Ref Range Status   Specimen Description   Final    URINE, CLEAN CATCH Performed at [facility], [address] [place], [place] [address]    Special Requests   Final    NONE Performed at [facility], [address], [place], [place] [address]    Culture (A)  Final    >=100,000 COLONIES/mL ESCHERICHIA COLI NO GROUP B STREP (S.AGALACTIAE) ISOLATED Performed at [facility], [address], [place], [place] [address]    Report Status 05/14/2023 FINAL  Final   Organism ID, Bacteria ESCHERICHIA COLI (A)  Final      Susceptibility   Escherichia coli - MIC*    AMPICILLIN >=32 RESISTANT Resistant     CEFEPIME <=0.12 SENSITIVE Sensitive     CEFTRIAXONE <=0.25 SENSITIVE Sensitive     CIPROFLOXACIN <=0.25 SENSITIVE Sensitive     GENTAMICIN 2 SENSITIVE Sensitive     IMIPENEM <=0.25 SENSITIVE Sensitive     NITROFURANTOIN <=16 SENSITIVE Sensitive     TRIMETH/SULFA >=320 RESISTANT Resistant     AMPICILLIN/SULBACTAM 8 SENSITIVE Sensitive     PIP/TAZO <=4 SENSITIVE Sensitive ug/mL    * >=100,000 COLONIES/mL ESCHERICHIA COLI      Radiology Studies: US ABDOMEN LIMITED RUQ (LIVER/GB)  Result Date: 05/15/2023 CLINICAL DATA:  Elevated liver function tests EXAM: ULTRASOUND ABDOMEN LIMITED RIGHT UPPER QUADRANT COMPARISON:  CT 05/15/2023. FINDINGS: Gallbladder: Previous cholecystectomy. Common bile duct: Diameter: 5 mm Liver: Mildly  echogenic hepatic  parenchyma consistent with fatty liver infiltration. Portal vein is patent on color Doppler imaging with normal direction of blood flow towards the liver. Other: None. IMPRESSION: Previous cholecystectomy. No ductal dilatation. Fatty liver infiltration. Electronically Signed   By: Karen Kays M.D.   On: 05/15/2023 20:14       LOS: 6 days   Mathayus Stanbery Rito Ehrlich  [facility] Pager on [email or web address]  05/17/2023, 10:03 AM

## 2023-05-18 DIAGNOSIS — I509 Heart failure, unspecified: Secondary | ICD-10-CM | POA: Insufficient documentation

## 2023-05-18 DIAGNOSIS — F0280 Dementia in other diseases classified elsewhere without behavioral disturbance: Secondary | ICD-10-CM | POA: Insufficient documentation

## 2023-05-18 DIAGNOSIS — S32020A Wedge compression fracture of second lumbar vertebra, initial encounter for closed fracture: Secondary | ICD-10-CM | POA: Diagnosis not present

## 2023-05-18 DIAGNOSIS — F32A Depression, unspecified: Secondary | ICD-10-CM | POA: Insufficient documentation

## 2023-05-18 LAB — CBC
HCT: 28.1 % — ABNORMAL LOW (ref 36.0–46.0)
Hemoglobin: 8 g/dL — ABNORMAL LOW (ref 12.0–15.0)
MCH: 30.2 pg (ref 26.0–34.0)
MCHC: 28.5 g/dL — ABNORMAL LOW (ref 30.0–36.0)
MCV: 106 fL — ABNORMAL HIGH (ref 80.0–100.0)
Platelets: 166 10*3/uL (ref 150–400)
RBC: 2.65 MIL/uL — ABNORMAL LOW (ref 3.87–5.11)
RDW: 15.6 % — ABNORMAL HIGH (ref 11.5–15.5)
WBC: 5.3 10*3/uL (ref 4.0–10.5)
nRBC: 0 % (ref 0.0–0.2)

## 2023-05-18 LAB — COMPREHENSIVE METABOLIC PANEL
ALT: 47 U/L — ABNORMAL HIGH (ref 0–44)
AST: 56 U/L — ABNORMAL HIGH (ref 15–41)
Albumin: 2.5 g/dL — ABNORMAL LOW (ref 3.5–5.0)
Alkaline Phosphatase: 114 U/L (ref 38–126)
Anion gap: 10 (ref 5–15)
BUN: 39 mg/dL — ABNORMAL HIGH (ref 8–23)
CO2: 29 mmol/L (ref 22–32)
Calcium: 8.3 mg/dL — ABNORMAL LOW (ref 8.9–10.3)
Chloride: 99 mmol/L (ref 98–111)
Creatinine, Ser: 1 mg/dL (ref 0.44–1.00)
GFR, Estimated: 58 mL/min — ABNORMAL LOW (ref >60)
Glucose, Bld: 98 mg/dL (ref 70–99)
Potassium: 4.1 mmol/L (ref 3.5–5.1)
Sodium: 138 mmol/L (ref 135–145)
Total Bilirubin: 0.6 mg/dL (ref <1.2)
Total Protein: 5.3 g/dL — ABNORMAL LOW (ref 6.5–8.1)

## 2023-05-18 MED ORDER — LEVOTHYROXINE SODIUM 88 MCG PO TABS: 88.0000 ug | ORAL_TABLET | Freq: Every day | ORAL | Status: DC

## 2023-05-18 MED ORDER — MELATONIN 3 MG PO TABS: 3.0000 mg | ORAL_TABLET | Freq: Every evening | ORAL | Status: AC | PRN

## 2023-05-18 MED ORDER — LIDOCAINE 5 % EX PTCH: 2.0000 | MEDICATED_PATCH | CUTANEOUS | Status: DC

## 2023-05-18 MED ORDER — OXYCODONE HCL 5 MG PO TABS: 5.0000 mg | ORAL_TABLET | ORAL | 0 refills | Status: DC | PRN

## 2023-05-18 MED ORDER — METHOCARBAMOL 500 MG PO TABS: 500.0000 mg | ORAL_TABLET | Freq: Four times a day (QID) | ORAL | Status: DC | PRN

## 2023-05-18 MED ORDER — METHOCARBAMOL 500 MG PO TABS: 500.0000 mg | ORAL_TABLET | Freq: Four times a day (QID) | ORAL | 0 refills | Status: DC | PRN

## 2023-05-18 MED ORDER — PANTOPRAZOLE SODIUM 40 MG PO TBEC: 40.0000 mg | DELAYED_RELEASE_TABLET | Freq: Two times a day (BID) | ORAL | Status: DC

## 2023-05-18 MED ORDER — ENSURE ENLIVE PO LIQD: 237.0000 mL | Freq: Three times a day (TID) | ORAL | Status: DC

## 2023-05-18 MED ORDER — SENNOSIDES-DOCUSATE SODIUM 8.6-50 MG PO TABS: 1.0000 | ORAL_TABLET | Freq: Two times a day (BID) | ORAL | Status: DC

## 2023-05-18 MED ORDER — APIXABAN 5 MG PO TABS: 5.0000 mg | ORAL_TABLET | Freq: Two times a day (BID) | ORAL | Status: DC

## 2023-05-18 MED ORDER — FOLIC ACID 1 MG PO TABS: 1.0000 mg | ORAL_TABLET | Freq: Every day | ORAL | Status: DC

## 2023-05-18 NOTE — TOC Transition Note (Signed)
Transition of Care [facility] - CM/SW Discharge Note   Patient Details  Name: Peggy Armstrong MRN: [id number] Date of Birth: 07-18-1945  Transition of Care [facility]) CM/SW Contact:  Otelia Santee, LCSW Phone Number: 05/18/2023, 1:56 PM   Clinical Narrative:    Pt able to transfer to [facility] for SNF. Pt will be going to room 215. RN to call report to [phone]. PTAR called at 1:56pm.    Final next level of care: Skilled Nursing Facility Barriers to Discharge: Barriers Resolved   Patient Goals and CMS Choice CMS Medicare.gov Compare Post Acute Care list provided to:: Patient Represenative (must comment) (Patient's daughter [name]) Choice offered to / list presented to : Adult Children  Discharge Placement     Existing PASRR number confirmed : 05/14/23          Patient chooses bed at: Other - please specify in the comment section below: ( Rehab) Patient to be transferred to facility by: PTAR Name of family member notified: Pt and daughter Patient and family notified of of transfer: 05/18/23  Discharge Plan and Services Additional resources added to the After Visit Summary for       Post Acute Care Choice: Skilled Nursing Facility          DME Arranged: N/A DME Agency: NA                  Social Determinants of Health (SDOH) Interventions SDOH Screenings   Food Insecurity: No Food Insecurity (05/12/2023)  Housing: Low Risk  (05/12/2023)  Transportation Needs: No Transportation Needs (05/12/2023)  Utilities: Not At Risk (05/12/2023)  Financial Resource Strain: Low Risk  (12/02/2022)   Received from [facility], [facility]  Physical Activity: Unknown (04/05/2022)   Received from [facility], [facility]  Social Connections: Unknown (04/06/2023)   Received from [facility]  Stress: No Stress Concern Present (04/05/2022)   Received from [facility], [facility]  Tobacco Use: Low Risk  (05/16/2023)  Recent Concern: Tobacco Use -  Medium Risk (05/04/2023)   Received from [facility]     Readmission Risk Interventions    05/14/2023    6:44 PM  Readmission Risk Prevention Plan  Transportation Screening Complete  PCP or Specialist Appt within 3-5 Days Complete  HRI or Home Care Consult Complete  Social Work Consult for Recovery Care Planning/Counseling Complete  Palliative Care Screening Complete  Medication Review Oceanographer) Referral to Pharmacy

## 2023-05-18 NOTE — Discharge Summary (Signed)
[facility]  Physician Discharge Summary   Patient ID: Peggy Armstrong MRN: [id number] DOB/AGE: 77/05/1946 77 y.o.  Admit date: 05/11/2023 Discharge date:   05/18/2023   PCP: Wilfred Curtis, MD  DISCHARGE DIAGNOSES:    Closed compression fracture of L2 lumbar vertebra, initial encounter (HCC) Duodenal mass GI bleeding Acute blood loss anemia   Essential hypertension   Hypokalemia   Chronic diastolic CHF (congestive heart failure) (HCC)   Acute cystitis   Paroxysmal atrial fibrillation (HCC)   AAA (abdominal aortic aneurysm) (HCC)   Hyperlipidemia   GAD (generalized anxiety disorder)   Acquired hypothyroidism   Gastric polyps   RECOMMENDATIONS FOR OUTPATIENT FOLLOW UP: Please resume Eliquis on 05/21/2023 Please check CBC on 05/22/2023 and then weekly Gastroenterology has referred patient to her providers in [place] for further management of her duodenal mass Patient needs follow-up with Dr. Conchita Paris with neurosurgery in 3 to 4 weeks    Home Health: Going to SNF Equipment/Devices: None  CODE STATUS: Full code  DISCHARGE CONDITION: fair  Diet recommendation: Soft diet  INITIAL HISTORY: PMH of HTN, atrial flutter/chronic A-fib on Eliquis, HLD, chronic HFpEF, GERD, DVT 2017.  Presented with 5-day history of left hip pain and generalized fatigue.  Apparently also mention of back pain.  Was found to have a L2 burst fracture.  Was hospitalized for pain control.  Subsequently developed anemia with heme positive stools.  Underwent GI evaluation.   Consultants: Gastroenterology   Procedures: Upper endoscopy  HOSPITAL COURSE:   Subacute L2 burst fracture. Acute insufficiency fracture of bilateral sacral alla Subacute right 10, 11, 12 rib fracture Etiology of the fracture is not clear. Concern for pathological fracture of per report. 6 mm retropulsion seen. ED provider discussed with Dr. Conchita Paris who recommended conservative measures.  No indication to  transfer to [facility].  TLSO brace pain control and PT OT. Outpatient follow-up with neurosurgery recommended. Patient was given pain medications.  No obvious neurological deficits noted but significant physical deconditioning noted.  Seen by PT and OT.  Short-term rehab was recommended. Pain seems to be improving.  Continue PT and OT.   L5-S1 disc protrusion. Possibly causing nerve root irritation on left S1 although currently asymptomatic.   E. coli UTI. Acute cystitis. Possible frontal sinusitis Received IV ceftriaxone and then changed over to cefadroxil for 5 days.  She will complete the course during this hospital stay.   Acute blood loss anemia chronic iron deficiency anemia. Bleeding gastric polyps.  Esophagitis.  Hiatal hernia.   Duodenal mass. Patient sees hematology outpatient for chronic iron deficiency anemia.  Prior workup for myeloma has been negative. Iron transfusion done on at 10/18. Iron level actually lower than what it was at the time of October evaluation when she actually received IV iron therapy. Given IV folic acid.  Will give IV iron as well. Eliquis was placed on hold. Gastroenterology was consulted Concern for duodenal mass.  Pathology is pending.  Gastroenterology has referred her to her providers in [place] for further management of this duodenal mass.  PPI changed to twice a day.  Eliquis can be resumed 11/24.  CBCs will need to be monitored at skilled nursing facility.     Hypothyroidism. Continuing Synthroid.  Dose was increased due to elevated TSH.   GERD. Continuing Pepcid and PPI.   Mood disorder. On Zoloft.  BuSpar. Continue.   Chronic A-fib/atrial flutter. Currently rate controlled. On amiodarone, continue. Holding Cardizem due to bradycardia.   Eliquis can be  resumed on 05/17/2023.   Essential hypertension/Chronic diastolic CHF. Home regimen includes Cardizem, Lasix, losartan. Currently losartan is on hold for borderline low  blood pressures.  Cardizem was on hold for bradycardia. Lasix resumed.     Hypokalemia. Supplemented   Mild transaminitis Stable for the most part.  Noted to be on statin also on amiodarone.  These could be contributing.  As long as levels do not rise significantly we can continue to monitor while patient is on these medications.   Obesity Class 3 Body mass index is 44.78 kg/m.  Placing the pt at higher risk of poor outcomes.   Chronic HFpEF  Volume status is adequate. Holding diuretic.   HLD. On Lipitor, continue.   Jaw pain. Patient reports that the right jaw has been hurting severely limiting her ability to eat and chew. Given that she had multiple fractures which were identified incidentally a CT scan of the maxillofacial area was also recommended to ensure that she does not have any fracture of the area as well as fracture of the head. Reassuringly both CT scans are negative.   Class III obesity Estimated body mass index is 40.3 kg/m as calculated from the following:   Height as of this encounter: 5' (1.524 m).   Weight as of this encounter: 93.6 kg.  Patient is stable.  No further recurrence of GI bleed.  Hemoglobin is low but stable.  Okay for discharge to SNF for short-term rehab.  Discussed with her daughter over the phone.   PERTINENT LABS:  The results of significant diagnostics from this hospitalization (including imaging, microbiology, ancillary and laboratory) are listed below for reference.    Microbiology: Recent Results (from the past 240 hour(s))  Culture, OB Urine     Status: Abnormal   Collection Time: 05/11/23  5:21 PM   Specimen: Urine, Clean Catch  Result Value Ref Range Status   Specimen Description   Final    URINE, CLEAN CATCH Performed at [facility], [address], [place], [place] [address]    Special Requests   Final    NONE Performed at [facility], [address], [place], [place] [address]     Culture (A)  Final    >=100,000 COLONIES/mL ESCHERICHIA COLI NO GROUP B STREP (S.AGALACTIAE) ISOLATED Performed at [facility], [address], [place], [place] [address]    Report Status 05/14/2023 FINAL  Final   Organism ID, Bacteria ESCHERICHIA COLI (A)  Final      Susceptibility   Escherichia coli - MIC*    AMPICILLIN >=32 RESISTANT Resistant     CEFEPIME <=0.12 SENSITIVE Sensitive     CEFTRIAXONE <=0.25 SENSITIVE Sensitive     CIPROFLOXACIN <=0.25 SENSITIVE Sensitive     GENTAMICIN 2 SENSITIVE Sensitive     IMIPENEM <=0.25 SENSITIVE Sensitive     NITROFURANTOIN <=16 SENSITIVE Sensitive     TRIMETH/SULFA >=320 RESISTANT Resistant     AMPICILLIN/SULBACTAM 8 SENSITIVE Sensitive     PIP/TAZO <=4 SENSITIVE Sensitive ug/mL    * >=100,000 COLONIES/mL ESCHERICHIA COLI     Labs:   Basic Metabolic Panel: Recent Labs  Lab 05/12/23 0516 05/14/23 0553 05/15/23 0327 05/16/23 0535 05/17/23 0537 05/18/23 0626  NA 136 138 138 135 137 138  K 3.6 3.9 3.3* 3.7 3.3* 4.1  CL 103 104 102 99 99 99  CO2 25 28 28 28 30 29   GLUCOSE 96 101* 100* 83 109* 98  BUN 30* 28* 42* 33* 38*  39*  CREATININE 0.93 0.94 1.07* 0.99 1.04* 1.00  CALCIUM 8.7* 8.5* 8.1* 8.5* 8.3* 8.3*  MG 1.9 1.7 1.7 1.7 1.7  --    Liver Function Tests: Recent Labs  Lab 05/12/23 0516 05/14/23 1313 05/17/23 0537 05/18/23 0626  AST 57* 58* 61* 56*  ALT 57* 49* 49* 47*  ALKPHOS 105 107 119 114  BILITOT 0.6 0.4 0.4 0.6  PROT 5.7* 5.2* 5.5* 5.3*  ALBUMIN 2.9* 2.4* 2.7* 2.5*    CBC: Recent Labs  Lab 05/12/23 0516 05/14/23 0553 05/15/23 0537 05/15/23 1059 05/16/23 0535 05/17/23 0537 05/18/23 0626  WBC 7.1   < > 5.3 5.7 4.3 5.8 5.3  NEUTROABS 5.9  --   --   --   --  4.8  --   HGB 8.5*   < > 8.0* 8.2* 9.4* 8.3* 8.0*  HCT 29.1*   < > 26.8* 26.7* 31.1* 27.9* 28.1*  MCV 107.0*   < > 104.3* 103.5* 105.4* 104.5* 106.0*  PLT 99*   < > 135* 142* 145* 151 166   < > = values in this interval not displayed.      IMAGING STUDIES US ABDOMEN LIMITED RUQ (LIVER/GB)  Result Date: 05/15/2023 CLINICAL DATA:  Elevated liver function tests EXAM: ULTRASOUND ABDOMEN LIMITED RIGHT UPPER QUADRANT COMPARISON:  CT 05/15/2023. FINDINGS: Gallbladder: Previous cholecystectomy. Common bile duct: Diameter: 5 mm Liver: Mildly echogenic hepatic parenchyma consistent with fatty liver infiltration. Portal vein is patent on color Doppler imaging with normal direction of blood flow towards the liver. Other: None. IMPRESSION: Previous cholecystectomy. No ductal dilatation. Fatty liver infiltration. Electronically Signed   By: Karen Kays M.D.   On: 05/15/2023 20:14   CT ABDOMEN PELVIS WO CONTRAST  Result Date: 05/15/2023 CLINICAL DATA:  Anemia, concern for retroperitoneal bleed EXAM: CT ABDOMEN AND PELVIS WITHOUT CONTRAST TECHNIQUE: Multidetector CT imaging of the abdomen and pelvis was performed following the standard protocol without IV contrast. RADIATION DOSE REDUCTION: This exam was performed according to the departmental dose-optimization program which includes automated exposure control, adjustment of the mA and/or kV according to patient size and/or use of iterative reconstruction technique. COMPARISON:  CT lumbar spine dated 05/11/2023. CT abdomen/pelvis dated 03/22/2017. FINDINGS: Lower chest: Small bilateral pleural effusions with associated right basilar atelectasis. Hepatobiliary: Unenhanced liver is unremarkable. Status post cholecystectomy. No intrahepatic or extrahepatic dilatation. Pancreas: Within normal limits. Spleen: Borderline enlarged, measuring 14.2 cm in maximal craniocaudal dimension. Adrenals/Urinary Tract: Adrenal glands are within normal limits. Kidneys are within normal limits. No renal, ureteral, or bladder calculi. No hydronephrosis. Bladder is within normal limits. Stomach/Bowel: Stomach is notable for a moderate hiatal hernia/intrathoracic stomach. No evidence of bowel obstruction. Appendix is not  discretely visualized. Mild sigmoid diverticulosis, without evidence of diverticulitis. Vascular/Lymphatic: No evidence of abdominal aortic aneurysm. Specifically, no evidence of suprarenal abdominal aortic aneurysm when correlating with recent CT. Atherosclerotic calcifications of the abdominal aorta and branch vessels. No suspicious abdominopelvic lymphadenopathy. Reproductive: Status post hysterectomy. No adnexal masses. Other: No abdominopelvic ascites. No hemoperitoneum or free air. Musculoskeletal: No evidence of intramuscular/retroperitoneal hematoma. Moderate burst fracture at L2 with mild retropulsion, unchanged from recent CT, new from 2018. IMPRESSION: No evidence of intramuscular/retroperitoneal hematoma. Small bilateral pleural effusions with associated right basilar atelectasis. Stable L2 fracture, as above. Electronically Signed   By: Charline Bills M.D.   On: 05/15/2023 09:55   CT MAXILLOFACIAL WO CONTRAST  Result Date: 05/13/2023 CLINICAL DATA:  TMJ pain.  Limited movement of the right jaw. EXAM: CT MAXILLOFACIAL WITHOUT  CONTRAST TECHNIQUE: Multidetector CT imaging of the maxillofacial structures was performed. Multiplanar CT image reconstructions were also generated. RADIATION DOSE REDUCTION: This exam was performed according to the departmental dose-optimization program which includes automated exposure control, adjustment of the mA and/or kV according to patient size and/or use of iterative reconstruction technique. COMPARISON:  None Available. FINDINGS: Osseous: No fracture or mandibular dislocation. No destructive process. Patient is edentulous. TMJ is within normal limits bilaterally. Degenerative changes are present at C1-2. Orbits: The globes and orbits are within normal limits. Sinuses: Secretions are present in the left frontal sinus and anterior ethmoid air cells. The paranasal sinuses and mastoid air cells are otherwise clear. Soft tissues: Facial soft tissues are within normal  limits. No focal inflammatory changes are present. Limited intracranial: Within normal limits. IMPRESSION: 1. No acute or focal lesion to explain the patient's symptoms. 2. Secretions in the left frontal sinus and anterior ethmoid air cells. 3. Degenerative changes at C1-2. Electronically Signed   By: Marin Roberts M.D.   On: 05/13/2023 17:00   CT HEAD WO CONTRAST ( )  Result Date: 05/13/2023 CLINICAL DATA:  Headache, new onset.  Right jaw pain. EXAM: CT HEAD WITHOUT CONTRAST TECHNIQUE: Contiguous axial images were obtained from the base of the skull through the vertex without intravenous contrast. RADIATION DOSE REDUCTION: This exam was performed according to the departmental dose-optimization program which includes automated exposure control, adjustment of the mA and/or kV according to patient size and/or use of iterative reconstruction technique. COMPARISON:  None available. FINDINGS: Brain: Mild atrophy and white matter changes are present bilaterally. No acute infarct, hemorrhage, or mass lesion is present. Deep brain nuclei are within normal limits. The ventricles are of normal size. No significant extraaxial fluid collection is present. The brainstem and cerebellum are within normal limits. Midline structures are within normal limits. Vascular: Atherosclerotic calcifications are present at the cavernous internal carotid arteries bilaterally and at the dural margin of both vertebral arteries. No hyperdense vessel is present. Skull: Calvarium is intact. No focal lytic or blastic lesions are present. No significant extracranial soft tissue lesion is present. Sinuses/Orbits: Fluid is present in the scratched at scree shins are present in the left frontal sinus and anterior ethmoid air cells. The paranasal sinuses and mastoid air cells are otherwise clear. The globes and orbits are within normal limits. IMPRESSION: 1. No acute intracranial abnormality. 2. Mild atrophy and white matter disease likely  reflects the sequela of chronic microvascular ischemia. 3. Fluid in the left frontal sinus and anterior ethmoid air cells. This may reflect acute sinusitis. Electronically Signed   By: Marin Roberts M.D.   On: 05/13/2023 16:55   MR LUMBAR SPINE WO CONTRAST  Result Date: 05/13/2023 CLINICAL DATA:  Follow-up examination for compression fracture. EXAM: MRI LUMBAR SPINE WITHOUT CONTRAST TECHNIQUE: Multiplanar, multisequence MR imaging of the lumbar spine was performed. No intravenous contrast was administered. COMPARISON:  Comparison made with prior CT from 05/11/2023. FINDINGS: Segmentation: Standard. Lowest well-formed disc space labeled the L5-S1 level. Alignment: 3 mm facet mediated anterolisthesis of L4 on L5. Alignment otherwise normal with preservation of the normal lumbar lordosis. Vertebrae: Burst type compression fracture involving the L2 vertebral body again seen. There is near complete central height loss with 6 mm bony retropulsion. Associated mild marrow edema. Overall appearance is most consistent with a subacute compression fracture. No visible underlying pathologic lesion. Additional subacute healing fractures of the right posterior tenth, eleventh, and twelfth ribs, also seen on prior CT. Additionally, there are acute insufficiency  type fractures involving the bilateral sacral ala (series 11, image 16). No other acute or recent fracture. Vertebral body height otherwise maintained. Bone marrow signal intensity diffusely heterogeneous. Multiple scattered benign hemangiomata noted, most prominent of which seen within the T11, T12, and L3 vertebral bodies. No worrisome osseous lesions. Conus medullaris and cauda equina: Conus extends to the L2 level. Conus and cauda equina appear normal. Paraspinal and other soft tissues: Mild diffuse edema within the subcutaneous fat of the posterior lower back, likely related to overall volume status. Edema seen within the presacral space due to adjacent  insufficiency fractures. No loculated collections. Few scattered T2 hyperintense cyst noted about the visualized kidneys, largest of which measures 3.3 cm at the lower left upper pole. These are benign in appearance, with no follow-up imaging recommended. Disc levels: L1-2: Disc desiccation with mild disc bulge. Superimposed 6 mm bony retropulsion related to the L2 compression fracture. Mild bilateral facet hypertrophy. Resultant moderate spinal stenosis. Mild to moderate bilateral L1 foraminal narrowing. L2-3: Disc desiccation with mild disc bulge. Associated annular fissure noted. Mild facet and ligament flavum hypertrophy. No significant spinal stenosis. Mild to moderate left L2 foraminal narrowing. Right neural foramen remains patent. L3-4: Disc desiccation with mild disc bulge, slightly asymmetric to the right. Moderate bilateral facet hypertrophy. Resultant mild narrowing of the right lateral recess. Central canal remains patent. Mild bilateral L3 foraminal stenosis. L4-5: 3 mm anterolisthesis. Disc desiccation with mild disc bulge. Superimposed left foraminal disc protrusion with annular fissure (series 6, image 11). Moderate bilateral facet arthrosis. Resultant mild canal with mild to moderate bilateral subarticular stenosis. Mild to moderate bilateral L4 foraminal narrowing. L5-S1: Disc desiccation. Broad-based left paracentral to subarticular disc protrusion with annular fissure closely approximates the descending left S1 nerve root (series 9, image 33). Moderate right worse than left facet hypertrophy. No spinal stenosis. Mild to moderate right with mild left L5 foraminal narrowing. IMPRESSION: 1. Subacute burst type compression fracture involving the L2 vertebral body with near complete central height loss and 6 mm bony retropulsion. Resultant moderate spinal stenosis at this level. No underlying pathologic lesion identified. 2. Acute insufficiency type fractures involving the bilateral sacral ala. 3.  Subacute healing fractures of the right posterior tenth, eleventh, and twelfth ribs. 4. Broad-based left subarticular disc protrusion at L5-S1, closely approximating and potentially irritating the descending left S1 nerve root. 5. Mild to moderate multilevel foraminal due to disc bulging and facet disease as above. Electronically Signed   By: Rise Mu M.D.   On: 05/13/2023 05:29   DG CHEST PORT 1 VIEW  Result Date: 05/12/2023 CLINICAL DATA:  Short of breath EXAM: PORTABLE CHEST 1 VIEW COMPARISON:  03/28/2017 FINDINGS: Single frontal view of the chest demonstrates stable enlargement of the cardiac silhouette. No airspace disease, effusion, or pneumothorax. No acute bony abnormalities. IMPRESSION: 1. No acute intrathoracic process. Electronically Signed   By: Sharlet Salina M.D.   On: 05/12/2023 18:51   CT FEMUR LEFT WO CONTRAST  Result Date: 05/11/2023 CLINICAL DATA:  Fracture, femur C/o left hip pain x5 days. Denies fall/injury. Left pedal pulses noted. EXAM: CT OF THE LOWER LEFT EXTREMITY WITHOUT CONTRAST TECHNIQUE: Multidetector CT imaging of the lower left extremity was performed according to the standard protocol. RADIATION DOSE REDUCTION: This exam was performed according to the departmental dose-optimization program which includes automated exposure control, adjustment of the mA and/or kV according to patient size and/or use of iterative reconstruction technique. COMPARISON:  X-ray pelvis and left knee 05/11/2023 FINDINGS: Bones/Joint/Cartilage No  evidence of fracture, dislocation, or joint effusion. No evidence of severe arthropathy. No aggressive appearing focal bone abnormality. Ligaments Suboptimally assessed by CT. Muscles and Tendons Grossly unremarkable. Soft tissues Mild left hip/gluteal subcutaneus soft tissue edema with associated fat density lesion within the soft tissues measuring 3.6 x 2.4 cm. Associated central calcification and heterogeneity of this fat density within the  left gluteal soft tissues. Otherwise atherosclerotic plaque. IMPRESSION: 1. A 3.3 cm lipomatous lesion that demonstrates calcification and slight complexity. Recommend attention on follow-up. 2.  Negative for acute traumatic injury. Electronically Signed   By: Tish Frederickson M.D.   On: 05/11/2023 20:47   CT Lumbar Spine Wo Contrast  Result Date: 05/11/2023 CLINICAL DATA:  Low back pain, increased fracture risk C/o left hip pain x5 days. Denies fall/injury. Left pedal pulses noted. EXAM: CT LUMBAR SPINE WITHOUT CONTRAST TECHNIQUE: Multidetector CT imaging of the lumbar spine was performed without intravenous contrast administration. Multiplanar CT image reconstructions were also generated. RADIATION DOSE REDUCTION: This exam was performed according to the departmental dose-optimization program which includes automated exposure control, adjustment of the mA and/or kV according to patient size and/or use of iterative reconstruction technique. COMPARISON:  CT abdomen pelvis 03/22/2017, x-ray lumbar spine 09/13/2020 FINDINGS: Segmentation: 5 lumbar type vertebrae. Alignment: Mild retrolisthesis of L1 on L2. Vertebrae: Diffusely decreased bone density. Multilevel vertebral body hemangiomas. Interval development of an age-indeterminate complete burst fracture of the L2 vertebral body with a sclerotic appearance of the remaining vertebra with up to 99% vertebral body height loss and 6 mm retropulsion into the central canal. No acute fracture or focal pathologic process. Paraspinal and other soft tissues: Negative. Disc levels: Intervertebral disc space vacuum phenomenon at the L1-L2 and L2-L3 level. Otherwise maintained. Other: Severe atherosclerotic plaque of the aorta. Interval development of an aneurysmal suprarenal abdominal aorta with a caliber up to 3.2 cm. The infrarenal abdominal aorta is normal in caliber. Multilevel thoracic vertebral body hemangioma. Subacute to chronic healing right posterior tenth,  eleventh, twelfth rib fractures. Chronic slightly more conspicuous lucent benign-appearing lesion of the right iliac bone (5:106). Please see separately dictated CT left femur 05/11/2023. IMPRESSION: 1. Interval development of an age-indeterminate complete burst fracture of the L2 vertebral body with a sclerotic appearance of the remaining vertebra with up to 99% vertebral body height loss and 6 mm retropulsion into the central canal. Correlate with point tenderness to palpation to evaluate for an acute component. Pathologic fracture is not excluded given sclerosis of the majority of the vertebra. 2. 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 Recommend follow-up ultrasound every 3 years. (Ref.: J Vasc Surg. 2018; 67:2-77 and J Am Coll Radiol 2013;10(10):789-794.) Electronically Signed   By: Tish Frederickson M.D.   On: 05/11/2023 20:44   DG Pelvis 1-2 Views  Result Date: 05/11/2023 CLINICAL DATA:  Left hip pain for 5 days No known injury EXAM: LEFT KNEE - 1-2 VIEW; PELVIS - 1-2 VIEW COMPARISON:  None available FINDINGS: Pelvis: No fracture or dislocation. Evaluation of the left hip is limited due to internal rotation. Extensive Atherosclerotic changes seen throughout visualized arterial segments. Left knee: No fracture or dislocation. Atherosclerotic calcifications noted throughout the visualized vessels. IMPRESSION: No acute abnormality of the pelvis or left knee. Electronically Signed   By: Acquanetta Belling M.D.   On: 05/11/2023 20:25   DG Knee 2 Views Left  Result Date: 05/11/2023 CLINICAL DATA:  Left hip pain for 5 days No known injury EXAM: LEFT KNEE - 1-2  VIEW; PELVIS - 1-2 VIEW COMPARISON:  None available FINDINGS: Pelvis: No fracture or dislocation. Evaluation of the left hip is limited due to internal rotation. Extensive Atherosclerotic changes seen throughout visualized arterial segments. Left knee: No fracture or dislocation. Atherosclerotic calcifications noted throughout the  visualized vessels. IMPRESSION: No acute abnormality of the pelvis or left knee. Electronically Signed   By: Acquanetta Belling M.D.   On: 05/11/2023 20:25    DISCHARGE EXAMINATION: Vitals:   05/17/23 1214 05/17/23 2000 05/18/23 0500 05/18/23 0511  BP: 100/68 119/72  (!) 106/57  Pulse: 66 (!) 59  (!) 56  Resp:  18  18  Temp: 99.4 F (37.4 C) (!) 97.5 F (36.4 C)  (!) 97.5 F (36.4 C)  TempSrc:      SpO2: 95% 100%  100%  Weight:   93.6 kg   Height:       General appearance: Awake alert.  In no distress Resp: Clear to auscultation bilaterally.  Normal effort Cardio: S1-S2 is normal regular.  No S3-S4.  No rubs murmurs or bruit GI: Abdomen is soft.  Nontender nondistended.  Bowel sounds are present normal.  No masses organomegaly    DISPOSITION: SNF  Discharge Instructions     Call MD for:  difficulty breathing, headache or visual disturbances   Complete by: As directed    Call MD for:  extreme fatigue   Complete by: As directed    Call MD for:  persistant dizziness or light-headedness   Complete by: As directed    Call MD for:  persistant nausea and vomiting   Complete by: As directed    Call MD for:  severe uncontrolled pain   Complete by: As directed    Call MD for:  temperature >100.4   Complete by: As directed    Discharge instructions   Complete by: As directed    Please review instructions on the discharge summary.  You were cared for by a hospitalist during your hospital stay. If you have any questions about your discharge medications or the care you received while you were in the hospital after you are discharged, you can call the unit and asked to speak with the hospitalist on call if the hospitalist that took care of you is not available. Once you are discharged, your primary care physician will handle any further medical issues. Please note that NO REFILLS for any discharge medications will be authorized once you are discharged, as it is imperative that you return to  your primary care physician (or establish a relationship with a primary care physician if you do not have one) for your aftercare needs so that they can reassess your need for medications and monitor your lab values. If you do not have a primary care physician, you can call [phone] for a physician referral.   Increase activity slowly   Complete by: As directed          Allergies as of 05/18/2023       Reactions   Codeine Nausea And Vomiting   Made "very sick"        Medication List     STOP taking these medications    chlorthalidone 25 MG tablet Commonly known as: HYGROTON   diltiazem 120 MG 24 hr capsule Commonly known as: CARDIZEM CD   famotidine 20 MG tablet Commonly known as: PEPCID   losartan 25 MG tablet Commonly known as: COZAAR   tiZANidine 2 MG tablet Commonly known as: ZANAFLEX  TAKE these medications    acetaminophen 325 MG tablet Commonly known as: TYLENOL Take 650 mg by mouth every 6 (six) hours as needed for mild pain (pain score 1-3) or moderate pain (pain score 4-6).   allopurinol 300 MG tablet Commonly known as: ZYLOPRIM Take 300 mg by mouth daily.   amiodarone 200 MG tablet Commonly known as: PACERONE Take 1 tablet (200 mg total) by mouth daily. NEED APPOINTMENT   apixaban 5 MG Tabs tablet Commonly known as: ELIQUIS Take 1 tablet (5 mg total) by mouth 2 (two) times daily. Please resume on 05/17/2023. Start taking on: May 21, 2023 What changed:  additional instructions These instructions start on May 21, 2023. If you are unsure what to do until then, ask your doctor or other care provider.   atorvastatin 20 MG tablet Commonly known as: LIPITOR Take 1 tablet (20 mg total) by mouth daily.   busPIRone 10 MG tablet Commonly known as: BUSPAR Take 1 tablet by mouth 2 (two) times daily.   cholecalciferol 1000 units tablet Commonly known as: VITAMIN D Take 1,000 Units by mouth daily.   cyanocobalamin 1000 MCG  tablet Commonly known as: VITAMIN B12 Take 1,000 mcg by mouth daily.   feeding supplement Liqd Take 237 mLs by mouth 3 (three) times daily between meals.   ferrous sulfate 325 (65 FE) MG tablet Take 325 mg by mouth daily with breakfast.   folic acid 1 MG tablet Commonly known as: FOLVITE Take 1 tablet (1 mg total) by mouth daily.   furosemide 20 MG tablet Commonly known as: LASIX Take 20 mg by mouth daily.   Klor-Con M20 20 MEQ tablet Generic drug: potassium chloride SA Take 20 mEq by mouth daily.   levothyroxine 88 MCG tablet Commonly known as: SYNTHROID Take 1 tablet (88 mcg total) by mouth daily at 6 (six) AM. Start taking on: May 19, 2023 What changed:  medication strength how much to take when to take this   lidocaine 5 % Commonly known as: LIDODERM Place 2 patches onto the skin daily. Remove & Discard patch within 12 hours or as directed by MD   melatonin 3 MG Tabs tablet Take 1 tablet (3 mg total) by mouth at bedtime as needed (insomnia).   methocarbamol 500 MG tablet Commonly known as: ROBAXIN Take 1 tablet (500 mg total) by mouth every 6 (six) hours as needed for muscle spasms.   oxyCODONE 5 MG immediate release tablet Commonly known as: Oxy IR/ROXICODONE Take 1-2 tablets (5-10 mg total) by mouth every 4 (four) hours as needed for severe pain (pain score 7-10) or moderate pain (pain score 4-6).   pantoprazole 40 MG tablet Commonly known as: PROTONIX Take 1 tablet (40 mg total) by mouth 2 (two) times daily before a meal. What changed: when to take this   senna-docusate 8.6-50 MG tablet Commonly known as: Senokot-S Take 1 tablet by mouth 2 (two) times daily.   sertraline 25 MG tablet Commonly known as: ZOLOFT Take 25 mg by mouth daily.          Contact information for follow-up providers     Lisbeth Renshaw, MD Follow up.   Specialty: Neurosurgery Contact information: [address] [place]  [address] [phone]              Contact information for after-discharge care     Destination     [facility] .   Service: Skilled Nursing Contact information: [address] [place] [place] [address] [phone]                     TOTAL DISCHARGE TIME: 35 minutes  Bri Wakeman Rito Ehrlich  [facility] Pager on [email or web address]  05/18/2023, 9:02 AM

## 2023-05-18 NOTE — Care Management Important Message (Signed)
Important Message  Patient Details IM Letter given Name: Peggy Armstrong MRN: [id number] Date of Birth: 02-25-1946   Important Message Given:  Yes - Medicare IM     [name] 05/18/2023, 10:14 AM

## 2023-05-18 NOTE — Plan of Care (Signed)
  Problem: Education: Goal: Knowledge of General Education information will improve Description: Including pain rating scale, medication(s)/side effects and non-pharmacologic comfort measures Outcome: Progressing   Problem: Health Behavior/Discharge Planning: Goal: Ability to manage health-related needs will improve Outcome: Progressing   Problem: Clinical Measurements: Goal: Ability to maintain clinical measurements within normal limits will improve Outcome: Progressing Goal: Will remain free from infection Outcome: Progressing Goal: Diagnostic test results will improve Outcome: Progressing Goal: Respiratory complications will improve Outcome: Progressing Goal: Cardiovascular complication will be avoided Outcome: Progressing   Problem: Activity: Goal: Risk for activity intolerance will decrease Outcome: Progressing   Problem: Nutrition: Goal: Adequate nutrition will be maintained Outcome: Progressing   Problem: Coping: Goal: Level of anxiety will decrease Outcome: Progressing   Problem: Elimination: Goal: Will not experience complications related to bowel motility Outcome: Progressing Goal: Will not experience complications related to urinary retention Outcome: Progressing   Problem: Pain Management: Goal: General experience of comfort will improve Outcome: Progressing   Problem: Safety: Goal: Ability to remain free from injury will improve Outcome: Progressing   Problem: Skin Integrity: Goal: Risk for impaired skin integrity will decrease Outcome: Progressing   Problem: Activity: Goal: Ability to tolerate increased activity will improve Outcome: Progressing   Problem: Cardiac: Goal: Ability to achieve and maintain adequate cardiopulmonary perfusion will improve Outcome: Progressing

## 2023-05-18 NOTE — Telephone Encounter (Signed)
Refer to Dr. Ardell Isaacs note as well from path result 05/16/23. Fax has been sent to Dr. Edyth Gunnels with [facility].

## 2023-05-19 ENCOUNTER — Encounter (HOSPITAL_COMMUNITY): Payer: Self-pay | Admitting: Gastroenterology

## 2023-05-19 DIAGNOSIS — M6281 Muscle weakness (generalized): Secondary | ICD-10-CM | POA: Insufficient documentation

## 2023-05-19 DIAGNOSIS — R489 Unspecified symbolic dysfunctions: Secondary | ICD-10-CM | POA: Insufficient documentation

## 2023-05-19 DIAGNOSIS — R1310 Dysphagia, unspecified: Secondary | ICD-10-CM | POA: Insufficient documentation

## 2023-05-19 DIAGNOSIS — R262 Difficulty in walking, not elsewhere classified: Secondary | ICD-10-CM | POA: Insufficient documentation

## 2023-05-21 DIAGNOSIS — R2681 Unsteadiness on feet: Secondary | ICD-10-CM | POA: Insufficient documentation

## 2023-05-21 DIAGNOSIS — Z741 Need for assistance with personal care: Secondary | ICD-10-CM | POA: Insufficient documentation

## 2023-06-02 ENCOUNTER — Telehealth: Payer: Self-pay | Admitting: *Deleted

## 2023-06-02 NOTE — Telephone Encounter (Signed)
   Name: Peggy Armstrong  DOB: [birth year]  MRN: [id number]  Primary Cardiologist: Thurmon Fair, MD  Chart reviewed as part of pre-operative protocol coverage. Because of Peggy Armstrong's past medical history and time since last visit, she will require a follow-up in-office visit in order to better assess preoperative cardiovascular risk.  Pre-op covering staff: - Please schedule appointment and call patient to inform them. If patient already had an upcoming appointment within acceptable timeframe, please add "pre-op clearance" to the appointment notes so provider is aware. - Please contact requesting surgeon's office via preferred method (i.e, phone, fax) to inform them of need for appointment prior to surgery.  This message will also be routed to pharmacy pool        Roe Rutherford [place], [place]  06/02/2023, 2:14 PM

## 2023-06-02 NOTE — Telephone Encounter (Signed)
Patient with diagnosis of Atrial flutter/atrial fibrillation and hx of DVT in 2017  on Eliquis for anticoagulation.    Procedure:  EGD / ENDOSCOPIC ULTRASOUND Date of procedure: TBD     CHA2DS2-VASc Score = 5   This indicates a 7.2% annual risk of stroke. The patient's score is based upon: CHF History: 1 HTN History: 1 Diabetes History: 0 Stroke History: 0 Vascular Disease History: 0 Age Score: 2 Gender Score: 1    CrCl 59 ml/min (based on Adj BW) Platelet count 166   Per office protocol, patient can hold Eliquis for 2 days prior to procedure.     **This guidance is not considered finalized until pre-operative APP has relayed final recommendations.**

## 2023-06-02 NOTE — Telephone Encounter (Signed)
1st attempt to reach pt regarding surgical clearance and the need for a IN OFFICE appointment.  Left pt a message to call back and whoever answers can assist her.

## 2023-06-02 NOTE — Telephone Encounter (Signed)
   Pre-operative Risk Assessment    Patient Name: Peggy Armstrong  DOB: 1945/12/27 MRN: [id number]    LAST APPOINTMENT 6/*07/2021 NO FUTURE APPOINTMENT SCHEDULED  Request for Surgical Clearance    Procedure:   EGD / ENDOSCOPIC ULTRASOUND  Date of Surgery:  Clearance TBD                                 Surgeon:   Surgeon's Group or Practice Name:  [facility]  Phone number:  [phone] Fax number:  [phone]   Type of Clearance Requested:   - Medical  - Pharmacy:  Hold Apixaban (Eliquis) IF CRCL >60 X'S 2 DAYS, IF CRCL 30-59 X'S 3 DAYS, AND IF CRCL 15-29 X'S 4 DAYS   Type of Anesthesia:  Not Indicated   Additional requests/questions:    Signed, Elliot Cousin   06/02/2023, 2:09 PM

## 2023-06-07 NOTE — Telephone Encounter (Signed)
2nd attmept to reach the pt to schedule in office appt fore preop clearance. While leaving vm the phone disconnected.   Pt needs IN OFFICE PREOP APPT .

## 2023-06-08 NOTE — Telephone Encounter (Signed)
3rd attempt to reach the pt to schedule an IN OFFICE APPT FOR PREOP CLEARANCE.   I will update the requesting office the needs to call the office to schedule an appt.

## 2023-06-20 ENCOUNTER — Encounter (HOSPITAL_COMMUNITY): Payer: Self-pay

## 2023-06-20 ENCOUNTER — Observation Stay (HOSPITAL_COMMUNITY)
Admission: EM | Admit: 2023-06-20 | Discharge: 2023-06-21 | Disposition: A | Discharge status: Skilled Nursing Facility | Payer: 59 | Attending: Internal Medicine | Admitting: Internal Medicine

## 2023-06-20 ENCOUNTER — Other Ambulatory Visit: Payer: Self-pay

## 2023-06-20 ENCOUNTER — Observation Stay (HOSPITAL_COMMUNITY): Payer: 59

## 2023-06-20 DIAGNOSIS — I5032 Chronic diastolic (congestive) heart failure: Secondary | ICD-10-CM | POA: Insufficient documentation

## 2023-06-20 DIAGNOSIS — E669 Obesity, unspecified: Secondary | ICD-10-CM | POA: Insufficient documentation

## 2023-06-20 DIAGNOSIS — D696 Thrombocytopenia, unspecified: Secondary | ICD-10-CM | POA: Diagnosis not present

## 2023-06-20 DIAGNOSIS — Z86718 Personal history of other venous thrombosis and embolism: Secondary | ICD-10-CM | POA: Diagnosis not present

## 2023-06-20 DIAGNOSIS — R531 Weakness: Secondary | ICD-10-CM | POA: Diagnosis present

## 2023-06-20 DIAGNOSIS — E785 Hyperlipidemia, unspecified: Secondary | ICD-10-CM | POA: Diagnosis not present

## 2023-06-20 DIAGNOSIS — K922 Gastrointestinal hemorrhage, unspecified: Secondary | ICD-10-CM

## 2023-06-20 DIAGNOSIS — Z7409 Other reduced mobility: Secondary | ICD-10-CM | POA: Insufficient documentation

## 2023-06-20 DIAGNOSIS — I480 Paroxysmal atrial fibrillation: Secondary | ICD-10-CM

## 2023-06-20 DIAGNOSIS — D132 Benign neoplasm of duodenum: Secondary | ICD-10-CM | POA: Insufficient documentation

## 2023-06-20 DIAGNOSIS — E876 Hypokalemia: Secondary | ICD-10-CM | POA: Diagnosis not present

## 2023-06-20 DIAGNOSIS — I482 Chronic atrial fibrillation, unspecified: Secondary | ICD-10-CM | POA: Insufficient documentation

## 2023-06-20 DIAGNOSIS — Z79899 Other long term (current) drug therapy: Secondary | ICD-10-CM | POA: Diagnosis not present

## 2023-06-20 DIAGNOSIS — Z01810 Encounter for preprocedural cardiovascular examination: Secondary | ICD-10-CM

## 2023-06-20 DIAGNOSIS — F419 Anxiety disorder, unspecified: Secondary | ICD-10-CM | POA: Insufficient documentation

## 2023-06-20 DIAGNOSIS — I130 Hypertensive heart and chronic kidney disease with heart failure and stage 1 through stage 4 chronic kidney disease, or unspecified chronic kidney disease: Secondary | ICD-10-CM | POA: Insufficient documentation

## 2023-06-20 DIAGNOSIS — D649 Anemia, unspecified: Principal | ICD-10-CM | POA: Insufficient documentation

## 2023-06-20 DIAGNOSIS — Z7901 Long term (current) use of anticoagulants: Secondary | ICD-10-CM | POA: Insufficient documentation

## 2023-06-20 DIAGNOSIS — F32A Depression, unspecified: Secondary | ICD-10-CM | POA: Insufficient documentation

## 2023-06-20 DIAGNOSIS — N183 Chronic kidney disease, stage 3 unspecified: Secondary | ICD-10-CM | POA: Diagnosis not present

## 2023-06-20 DIAGNOSIS — E039 Hypothyroidism, unspecified: Secondary | ICD-10-CM | POA: Insufficient documentation

## 2023-06-20 HISTORY — DX: Long term (current) use of anticoagulants: Z79.01

## 2023-06-20 HISTORY — DX: Gastrointestinal hemorrhage, unspecified: K92.2

## 2023-06-20 LAB — HEPATIC FUNCTION PANEL
ALT: 43 U/L (ref 0–44)
AST: 52 U/L — ABNORMAL HIGH (ref 15–41)
Albumin: 2.7 g/dL — ABNORMAL LOW (ref 3.5–5.0)
Alkaline Phosphatase: 116 U/L (ref 38–126)
Bilirubin, Direct: 0.1 mg/dL (ref 0.0–0.2)
Indirect Bilirubin: 0.6 mg/dL (ref 0.3–0.9)
Total Bilirubin: 0.7 mg/dL (ref <1.2)
Total Protein: 5.1 g/dL — ABNORMAL LOW (ref 6.5–8.1)

## 2023-06-20 LAB — ECHOCARDIOGRAM COMPLETE
AR max vel: 1.67 cm2
AV Area VTI: 1.95 cm2
AV Area mean vel: 1.43 cm2
AV Mean grad: 4 mm[Hg]
AV Peak grad: 8.2 mm[Hg]
Ao pk vel: 1.43 m/s
Area-P 1/2: 4.31 cm2
Calc EF: 55.6 %
Height: 60 in
MV VTI: 1.69 cm2
S' Lateral: 4.1 cm
Single Plane A2C EF: 59 %
Single Plane A4C EF: 51.2 %
Weight: 3118.4 [oz_av]

## 2023-06-20 LAB — BASIC METABOLIC PANEL
Anion gap: 8 (ref 5–15)
BUN: 29 mg/dL — ABNORMAL HIGH (ref 8–23)
CO2: 25 mmol/L (ref 22–32)
Calcium: 8.4 mg/dL — ABNORMAL LOW (ref 8.9–10.3)
Chloride: 107 mmol/L (ref 98–111)
Creatinine, Ser: 0.73 mg/dL (ref 0.44–1.00)
GFR, Estimated: >60 mL/min (ref >60)
Glucose, Bld: 87 mg/dL (ref 70–99)
Potassium: 3.4 mmol/L — ABNORMAL LOW (ref 3.5–5.1)
Sodium: 140 mmol/L (ref 135–145)

## 2023-06-20 LAB — CBC WITH DIFFERENTIAL/PLATELET
Abs Immature Granulocytes: 0.07 10*3/uL (ref 0.00–0.07)
Basophils Absolute: 0 10*3/uL (ref 0.0–0.1)
Basophils Relative: 0 %
Eosinophils Absolute: 0.1 10*3/uL (ref 0.0–0.5)
Eosinophils Relative: 1 %
HCT: 20.4 % — ABNORMAL LOW (ref 36.0–46.0)
Hemoglobin: 5.9 g/dL — CL (ref 12.0–15.0)
Immature Granulocytes: 1 %
Lymphocytes Relative: 16 %
Lymphs Abs: 0.8 10*3/uL (ref 0.7–4.0)
MCH: 30.3 pg (ref 26.0–34.0)
MCHC: 28.9 g/dL — ABNORMAL LOW (ref 30.0–36.0)
MCV: 104.6 fL — ABNORMAL HIGH (ref 80.0–100.0)
Monocytes Absolute: 0.4 10*3/uL (ref 0.1–1.0)
Monocytes Relative: 8 %
Neutro Abs: 3.6 10*3/uL (ref 1.7–7.7)
Neutrophils Relative %: 74 %
Platelets: 100 10*3/uL — ABNORMAL LOW (ref 150–400)
RBC: 1.95 MIL/uL — ABNORMAL LOW (ref 3.87–5.11)
RDW: 18.1 % — ABNORMAL HIGH (ref 11.5–15.5)
WBC: 4.9 10*3/uL (ref 4.0–10.5)
nRBC: 0 % (ref 0.0–0.2)

## 2023-06-20 LAB — HEMOGLOBIN AND HEMATOCRIT, BLOOD
HCT: 25.4 % — ABNORMAL LOW (ref 36.0–46.0)
HCT: 28.5 % — ABNORMAL LOW (ref 36.0–46.0)
Hemoglobin: 7.8 g/dL — ABNORMAL LOW (ref 12.0–15.0)
Hemoglobin: 8.6 g/dL — ABNORMAL LOW (ref 12.0–15.0)

## 2023-06-20 LAB — PREPARE RBC (CROSSMATCH)

## 2023-06-20 LAB — ABO/RH: ABO/RH(D): O POS

## 2023-06-20 LAB — TSH: TSH: 11.209 u[IU]/mL — ABNORMAL HIGH (ref 0.350–4.500)

## 2023-06-20 LAB — POC OCCULT BLOOD, ED: Fecal Occult Bld: POSITIVE — AB

## 2023-06-20 MED ORDER — PANTOPRAZOLE SODIUM 40 MG IV SOLR
40.0000 mg | Freq: Two times a day (BID) | INTRAVENOUS | Status: DC
  Administered 2023-06-20 – 2023-06-21 (×2): 40 mg via INTRAVENOUS
  Filled 2023-06-20 (×2): qty 10

## 2023-06-20 MED ORDER — OXYCODONE HCL 5 MG PO TABS: 5.0000 mg | ORAL_TABLET | ORAL | Status: DC | PRN

## 2023-06-20 MED ORDER — METHOCARBAMOL 500 MG PO TABS: 500.0000 mg | ORAL_TABLET | Freq: Four times a day (QID) | ORAL | Status: DC | PRN

## 2023-06-20 MED ORDER — TRAZODONE HCL 50 MG PO TABS
50.0000 mg | ORAL_TABLET | Freq: Every evening | ORAL | Status: DC | PRN
  Filled 2023-06-20: qty 1

## 2023-06-20 MED ORDER — ATORVASTATIN CALCIUM 20 MG PO TABS
20.0000 mg | ORAL_TABLET | Freq: Every day | ORAL | Status: DC
  Administered 2023-06-20 – 2023-06-21 (×2): 20 mg via ORAL
  Filled 2023-06-20: qty 1
  Filled 2023-06-20: qty 2

## 2023-06-20 MED ORDER — SODIUM CHLORIDE 0.9% IV SOLUTION: Freq: Once | INTRAVENOUS | Status: AC

## 2023-06-20 MED ORDER — ONDANSETRON HCL 4 MG/2ML IJ SOLN: 4.0000 mg | Freq: Four times a day (QID) | INTRAMUSCULAR | Status: DC | PRN

## 2023-06-20 MED ORDER — ACETAMINOPHEN 325 MG PO TABS: 650.0000 mg | ORAL_TABLET | Freq: Four times a day (QID) | ORAL | Status: DC | PRN

## 2023-06-20 MED ORDER — ACETAMINOPHEN 650 MG RE SUPP: 650.0000 mg | Freq: Four times a day (QID) | RECTAL | Status: DC | PRN

## 2023-06-20 MED ORDER — AMIODARONE HCL 200 MG PO TABS
200.0000 mg | ORAL_TABLET | Freq: Every day | ORAL | Status: DC
  Administered 2023-06-20 – 2023-06-21 (×2): 200 mg via ORAL
  Filled 2023-06-20 (×2): qty 1

## 2023-06-20 MED ORDER — PANTOPRAZOLE SODIUM 40 MG IV SOLR
80.0000 mg | Freq: Once | INTRAVENOUS | Status: AC
  Administered 2023-06-20: 80 mg via INTRAVENOUS
  Filled 2023-06-20: qty 20

## 2023-06-20 MED ORDER — ONDANSETRON HCL 4 MG PO TABS: 4.0000 mg | ORAL_TABLET | Freq: Four times a day (QID) | ORAL | Status: DC | PRN

## 2023-06-20 MED ORDER — ALBUTEROL SULFATE (2.5 MG/3ML) 0.083% IN NEBU: 2.5000 mg | INHALATION_SOLUTION | RESPIRATORY_TRACT | Status: DC | PRN

## 2023-06-20 NOTE — Consult Note (Signed)
Cardiology Consultation   Patient ID: Peggy Armstrong MRN: [id number]; DOB: 1946-01-02  Admit date: 06/20/2023 Date of Consult: 06/20/2023  PCP:  Wilfred Curtis, MD    [facility] Providers Cardiologist:  Thurmon Fair, MD   {    Patient Profile:   Peggy Armstrong is a 77 y.o. female with a hx of paroxysmal A fib/flutter on Eliquis, chronic diastolic heart failure, HTN, obesity, anxiety, GERD, DVT 2017,  L2 burst fracture and multiple ribs fracture 04/2023,duodenal mass, history of ampullary adenocarcinoma status post ampullectomy 2019, hypothyroidism, chronic iron deficiency anemia,    who is being seen 06/20/2023 for the evaluation of pre-op evaluation at the request of Dr Kirby Crigler.  History of Present Illness:   Peggy Armstrong with above PMH who presented to ER today for evaluation of low Hgb on lab. She endorses mild lightheadedness and SOB with exertion. She is current at [facility] rehab and had labs collected showing Hgb 5.8. She reports having dark stools intermittently. She states she has been at Rehab since her last hospitalization, was doing well with PT, states her pain was much better and her arthritis is limiting her activity at baseline.  She was able to walk 160 feet far and 16 flights of stairs yesterday. She noted SOB with exertion, denied any chest pain. She stopped taking lasix 2-3 months ago due to no edema of her legs. She is not on diltiazem for 4-6 weeks since her rehab stay for unclear reason. She does not have symptoms with A fib at baseline.   She was recently hospitalized here 05/11/23 -05/18/23 for subacute L2 burst fracture, acute insufficiency fracture of bilateral sacral alla, and subacute right 10, 11, 12 rib fracture. She was managed conservatively with pain meds, TLSO brace, and PT/OT. She was given antibiotic for E coli UTI and possible sinusitis. Course was complicated by acute blood loss anemia requiring iron infusion. She was seen by GI,  underwent upper endoscopy 05/16/23 which showed LA Grade B reflux esophagitis with no bleeding. Medium sized hiatal hernia. 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 Multiple gastric polyps. Two were bleeding. Resected and retrieved. 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 Biopsied. 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 She was resumed on Eliquis 3 days after EGD and was recommended to [facility] Dr Edyth Gunnels for duodenal adenoma resection. She needed pre-op surgery risk evaluation but has not been able to get into cardiology office and surgery has not been scheduled.   She follows Dr Royann Shivers outpatient, last seen 11/26/21 by Ms Dan Humphreys NP-C, reportedly developed A fib/flutter back in 02/2017, has been managed on diltiazem, amiodarone and Eliquis since 2019. Eliquis was temporarily stopped due to GI bleeding after removal of small polyp during EGD at one point and this was resumed since May 2019. Most recent Echo was from 03/28/2017  showed LVEF 60-65%, mild LVH, no RWMA, grade 2 DD, moderately dilated RV, mild RAE. She was on lasix 20mg  daily previously.    Admission labs today showed K 3.4, BUN 29. Hgb 5.9. PLT 100k. FOBT +. EKG today showed sinus rhythm 68bpm, QT 468 msec, limited by artifacts.  She was seen by GI, recommenced blood transfusion, HOLD Eliquis. Cardiology consult is requested today for pre-op evaluation for endoscopic resection of the duodenal adenoma at [facility]      Past Medical History:  Diagnosis Date  Acquired hypothyroidism    Atrial fibrillation (HCC)    DVT (deep venous thrombosis) (HCC)    right DVT   GAD (generalized anxiety disorder)    GERD (gastroesophageal reflux disease)    Gout    Gout    Hypertension    RA (rheumatoid arthritis) (HCC)     Past Surgical History:  Procedure  Laterality Date   ABDOMINAL HYSTERECTOMY     BIOPSY  05/16/2023   Procedure: BIOPSY;  Surgeon: Meryl Dare, MD;  Location: [facility];  Service: Gastroenterology;;   CHOLECYSTECTOMY     ENDOSCOPIC RETROGRADE CHOLANGIOPANCREATOGRAPHY (ERCP) WITH PROPOFOL N/A 06/15/2017   Procedure: ENDOSCOPIC RETROGRADE CHOLANGIOPANCREATOGRAPHY (ERCP) WITH PROPOFOL;  Surgeon: Rachael Fee, MD;  Location: [facility];  Service: Endoscopy;  Laterality: N/A;   ESOPHAGOGASTRODUODENOSCOPY (EGD) WITH PROPOFOL N/A 03/24/2017   Procedure: ESOPHAGOGASTRODUODENOSCOPY (EGD) WITH PROPOFOL;  Surgeon: Sherrilyn Rist, MD;  Location: [facility];  Service: Gastroenterology;  Laterality: N/A;   ESOPHAGOGASTRODUODENOSCOPY (EGD) WITH PROPOFOL N/A 11/07/2017   Procedure: ESOPHAGOGASTRODUODENOSCOPY (EGD) WITH PROPOFOL;  Surgeon: Rachael Fee, MD;  Location: [facility];  Service: Endoscopy;  Laterality: N/A;   ESOPHAGOGASTRODUODENOSCOPY (EGD) WITH PROPOFOL N/A 05/16/2023   Procedure: ESOPHAGOGASTRODUODENOSCOPY (EGD) WITH PROPOFOL;  Surgeon: Meryl Dare, MD;  Location: [facility];  Service: Gastroenterology;  Laterality: N/A;   EUS N/A 04/06/2017   Procedure: UPPER ENDOSCOPIC ULTRASOUND (EUS) LINEAR;  Surgeon: Rachael Fee, MD;  Location: [facility];  Service: Endoscopy;  Laterality: N/A;  to evaluate duodenal mass   HERNIA REPAIR     IR BILIARY DRAIN PLACEMENT WITH CHOLANGIOGRAM  03/25/2017   IR CONVERT BILIARY DRAIN TO INT EXT BILIARY DRAIN  04/03/2017   POLYPECTOMY  05/16/2023   Procedure: POLYPECTOMY;  Surgeon: Meryl Dare, MD;  Location: [facility];  Service: Gastroenterology;;     Home Medications:  Prior to Admission medications   Medication Sig Start Date End Date Taking? Authorizing Provider  allopurinol (ZYLOPRIM) 300 MG tablet Take 300 mg by mouth daily.  05/06/15  Yes [provider]  amiodarone (PACERONE) 200 MG tablet Take 1 tablet (200 mg total) by mouth daily. NEED  APPOINTMENT 02/15/23  Yes Croitoru, Mihai, MD  atorvastatin (LIPITOR) 20 MG tablet Take 1 tablet (20 mg total) by mouth daily. 12/19/22  Yes Alver Sorrow, NP  calcium carbonate (OS-CAL - DOSED IN MG OF ELEMENTAL CALCIUM) 1250 (500 Ca) MG tablet Take 1 tablet by mouth daily.   Yes [provider]  cholecalciferol (VITAMIN D) 1000 units tablet Take 1,000 Units by mouth daily.   Yes [provider]  ferrous sulfate 325 (65 FE) MG tablet Take 325 mg by mouth daily with breakfast.   Yes [provider]  folic acid (FOLVITE) 1 MG tablet Take 1 tablet (1 mg total) by mouth daily. 05/18/23  Yes Osvaldo Shipper, MD  furosemide (LASIX) 20 MG tablet Take 20 mg by mouth daily. 05/03/23  Yes [provider]  levothyroxine (SYNTHROID) 88 MCG tablet Take 1 tablet (88 mcg total) by mouth daily at 6 (six) AM. 05/19/23  Yes Osvaldo Shipper, MD  vitamin B-12 (CYANOCOBALAMIN) 1000 MCG tablet Take 1,000 mcg by mouth daily.   Yes [provider]  acetaminophen (TYLENOL) 325 MG tablet Take 650 mg by mouth every 6 (six) hours as needed for mild pain (pain score 1-3) or moderate pain (pain score 4-6).    [provider]  apixaban (ELIQUIS) 5 MG TABS tablet Take 1 tablet (5 mg total)  by mouth 2 (two) times daily. Please resume on 05/17/2023. 05/21/23   Osvaldo Shipper, MD  busPIRone (BUSPAR) 10 MG tablet Take 1 tablet by mouth 2 (two) times daily. 07/18/18   [provider]  feeding supplement (ENSURE ENLIVE / ENSURE PLUS) LIQD Take 237 mLs by mouth 3 (three) times daily between meals. 05/18/23   Osvaldo Shipper, MD  KLOR-CON M20 20 MEQ tablet Take 20 mEq by mouth daily.    [provider]  lidocaine (LIDODERM) 5 % Place 2 patches onto the skin daily. Remove & Discard patch within 12 hours or as directed by MD 05/18/23   Osvaldo Shipper, MD  melatonin 3 MG TABS tablet Take 1 tablet (3 mg total) by mouth at bedtime as needed (insomnia). 05/18/23    Osvaldo Shipper, MD  methocarbamol (ROBAXIN) 500 MG tablet Take 1 tablet (500 mg total) by mouth every 6 (six) hours as needed for muscle spasms. 05/18/23   Osvaldo Shipper, MD  oxyCODONE (OXY IR/ROXICODONE) 5 MG immediate release tablet Take 1-2 tablets (5-10 mg total) by mouth every 4 (four) hours as needed for severe pain (pain score 7-10) or moderate pain (pain score 4-6). 05/18/23   Osvaldo Shipper, MD  pantoprazole (PROTONIX) 40 MG tablet Take 1 tablet (40 mg total) by mouth 2 (two) times daily before a meal. 05/18/23   Osvaldo Shipper, MD  senna-docusate (SENOKOT-S) 8.6-50 MG tablet Take 1 tablet by mouth 2 (two) times daily. 05/18/23   Osvaldo Shipper, MD  sertraline (ZOLOFT) 25 MG tablet Take 25 mg by mouth daily.  07/07/17   [provider]    Inpatient Medications: Scheduled Meds:  sodium chloride   Intravenous Once   amiodarone  200 mg Oral Daily   atorvastatin  20 mg Oral Daily   pantoprazole (PROTONIX) IV  40 mg Intravenous Q12H   Continuous Infusions:  PRN Meds: acetaminophen **OR** acetaminophen, albuterol, methocarbamol, ondansetron **OR** ondansetron (ZOFRAN) IV, oxyCODONE, traZODone  Allergies:    Allergies  Allergen Reactions   Codeine Nausea And Vomiting    Made "very sick"   Tape     Social History:   Social History   Socioeconomic History   Marital status: Divorced    Spouse name: Not on file   Number of children: 2   Years of education: Not on file   Highest education level: Not on file  Occupational History   Not on file  Tobacco Use   Smoking status: Never   Smokeless tobacco: Never  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  Other Topics Concern   Not on file  Social History Narrative   Not on file   Social Drivers of Health   Financial Resource Strain: Low Risk  (12/02/2022)   Received from [facility], [facility]   Overall Financial Resource Strain (CARDIA)    Difficulty of Paying Living Expenses: Not very hard  Food Insecurity: No Food Insecurity (05/12/2023)   Hunger Vital Sign    Worried About Running Out of Food in the Last Year: Never true    Ran Out of Food in the Last Year: Never true  Transportation Needs: No Transportation Needs (05/12/2023)   PRAPARE - Administrator, Civil Service (Medical): No    Lack of Transportation (Non-Medical): No  Physical Activity: Unknown (04/05/2022)  Received from [facility], [facility]   Exercise Vital Sign    Days of Exercise per Week: 0 days    Minutes of Exercise per Session: Not on file  Stress: No Stress Concern Present (04/05/2022)   Received from [facility], [facility] - Occupational Stress Questionnaire    Feeling of Stress : Not at all  Social Connections: Unknown (04/06/2023)   Received from [facility]   Social Network    Social Network: Not on file  Intimate Partner Violence: Not At Risk (05/12/2023)   Humiliation, Afraid, Rape, and Kick questionnaire    Fear of Current or Ex-Partner: No    Emotionally Abused: No    Physically Abused: No    Sexually Abused: No    Family History:    Family History  Problem Relation Age of Onset   Prostate cancer Father    Colon cancer Father    Hypertension Mother    Rheum arthritis Maternal Grandmother    Diabetes Paternal Grandmother    Heart disease Paternal Grandfather      ROS:  Constitutional: Denied fever, chills, malaise, night sweats Eyes: Denied vision change or loss Ears/Nose/Mouth/Throat: Denied ear ache, sore throat, coughing, sinus pain Cardiovascular: denied chest pain/pressure Respiratory:see HPI  Gastrointestinal:see HPI  Genital/Urinary: Denied dysuria, hematuria, urinary frequency/urgency Musculoskeletal: arthritis  Skin: Denied rash, wound Neuro: Denied headache, dizziness,  syncope Psych: history of depression/anxiety  Endocrine: Denied history of diabetes   Physical Exam/Data:   Vitals:   06/20/23 1045 06/20/23 1100 06/20/23 1115 06/20/23 1130  BP: (!) 158/77 (!) 169/72 (!) 160/74 129/77  Pulse: (!) 58 68 62 (!) 58  Resp: 19 20 (!) 22 18  Temp:      TempSrc:      SpO2: 100% 100% 100% 99%  Weight:      Height:       No intake or output data in the 24 hours ending 06/20/23 1226    06/20/2023    4:08 AM 05/18/2023    5:00 AM 05/17/2023    4:45 AM  Last 3 Weights  Weight (lbs) 194 lb 14.4 oz 206 lb 5.6 oz 194 lb 7.1 oz  Weight (kg) 88.406 kg 93.6 kg 88.2 kg     Body mass index is 38.06 kg/m.   Vitals:  Vitals:   06/20/23 1115 06/20/23 1130  BP: (!) 160/74 129/77  Pulse: 62 (!) 58  Resp: (!) 22 18  Temp:    SpO2: 100% 99%   General Appearance: In no apparent distress, laying in bed, well nourished  HEENT: Normocephalic, atraumatic.  Neck: Supple, trachea midline, no JVDs Cardiovascular: Regular rate and rhythm, normal S1-S2,  no murmur  Respiratory: Resting breathing unlabored, lungs sounds clear to auscultation bilaterally, no use of accessory muscles. On room air.  No wheezes, rales or rhonchi.   Gastrointestinal: Bowel sounds positive, abdomen soft, non-tender, non-distended.   Extremities: Able to move all extremities in bed without difficulty, no edema of BLE Musculoskeletal: Normal muscle bulk and tone Skin: Intact, warm, dry.  Neurologic: Alert, oriented to person, place and time.no gross focal neuro deficit Psychiatric: Normal affect. Mood is appropriate.     EKG:  The EKG was personally reviewed and demonstrates:    EKG today showed sinus rhythm 68bpm, manual QT 468 msec, poor R wave progression, review is limited by artifacts.    Telemetry:  Telemetry was personally reviewed and demonstrates:    Sinus rhythm, occasional PVCs  Relevant CV Studies:   Echo from 03/28/2017:   Study Conclusions   - Left ventricle:  The cavity size was normal. Wall thickness was    increased in a pattern of mild LVH. Systolic function was normal.    The estimated ejection fraction was in the range of 60% to 65%.    Wall motion was normal; there were no regional wall motion    abnormalities. Features are consistent with a pseudonormal left    ventricular filling pattern, with concomitant abnormal relaxation    and increased filling pressure (grade 2 diastolic dysfunction).  - Aortic valve: Valve area (VTI): 1.61 cm^2. Valve area (Vmax):    1.64 cm^2. Valve area (Vmean): 1.41 cm^2.  - Right ventricle: The cavity size was moderately dilated. Systolic    function was moderately reduced.  - Right atrium: The atrium was mildly dilated.    Laboratory Data:  High Sensitivity Troponin:  No results for input(s): "TROPONINIHS" in the last 720 hours.   Chemistry Recent Labs  Lab 06/20/23 0439  NA 140  K 3.4*  CL 107  CO2 25  GLUCOSE 87  BUN 29*  CREATININE 0.73  CALCIUM 8.4*  GFRNONAA >60  ANIONGAP 8    No results for input(s): "PROT", "ALBUMIN", "AST", "ALT", "ALKPHOS", "BILITOT" in the last 168 hours. Lipids No results for input(s): "CHOL", "TRIG", "HDL", "LABVLDL", "LDLCALC", "CHOLHDL" in the last 168 hours.  Hematology Recent Labs  Lab 06/20/23 0439  WBC 4.9  RBC 1.95*  HGB 5.9*  HCT 20.4*  MCV 104.6*  MCH 30.3  MCHC 28.9*  RDW 18.1*  PLT 100*   Thyroid No results for input(s): "TSH", "FREET4" in the last 168 hours.  BNPNo results for input(s): "BNP", "PROBNP" in the last 168 hours.  DDimer No results for input(s): "DDIMER" in the last 168 hours.   Radiology/Studies:  No results found.   Assessment and Plan:   Pre-op risk evaluation for endoscopic resection of duodenal adenoma - RCRI is 1, 0.9% Perioperative Risk of Major Cardiac Event  - DASI is 12.7, 4.3 Functional METS - Will update Echo given hx of diastolic heart failure  - There is no absolute contraindication for planned low risk  surgery (once she is transfused with Hgb >7), may hold Eliquis 3 days before OR (if resumed per GI clearance), no bridge needed, no further workup needed   Acute blood loss anemia 2/2 GI bleed  - stop Eliquis, transfuse per GI, resume Eliquis upon clearance of GI   Paroxysmal A fib/flutter  - currently in sinus rhythm, continue PTA amiodarone, no longer on diltiazem 120mg  (this was mentioned on 11/26/21 office note) for unclear reason, OK to hold off adding back until Echo results  - stop Eliquis, resume Eliquis upon clearance of GI  - TSH and LFT both elevated in 04/2023, will repeat now   Chronic diastolic heart failure  - clinically euvolemic - Hold Lasix 20mg  in the setting of GI bleed - GDMT: BP high on trend, will likely add Coreg if remain high for the next 24 hours     Risk Assessment/Risk Scores:   New York Heart Association (NYHA) Functional Class NYHA Class I  CHA2DS2-VASc Score = 5   This indicates a 7.2% annual risk of stroke. The patient's score is based upon: CHF History: 1 HTN History: 1 Diabetes History: 0 Stroke History: 0 Vascular Disease History: 0 Age Score: 2 Gender Score: 1        For questions or updates, please  contact [facility] Please consult [email or web address] for contact info under    Signed, Cyndi Bender, NP  06/20/2023 12:26 PM

## 2023-06-20 NOTE — ED Triage Notes (Signed)
Pt BIBA from [facility] for abnormal labs. Noted that Hgb was 5.8. No reports of discomfort, pain, n/v ,lightheadedness, or dizziness. Pt on Eliquis for A-Fib but no reports of bleeding anywhere.  A&O x4 GCS 15  69-157/81-97%RA

## 2023-06-20 NOTE — Progress Notes (Signed)
  Echocardiogram 2D Echocardiogram has been performed.  Ocie Doyne RDCS 06/20/2023, 3:00 PM

## 2023-06-20 NOTE — Plan of Care (Signed)

## 2023-06-20 NOTE — ED Provider Notes (Signed)
[name] Provider Note   CSN: [id number] Arrival date & time: 06/20/23  0345     History  Chief Complaint  Patient presents with   Abnormal Lab    Peggy Armstrong is a 77 y.o. female.  Patient with past medical history significant for RA, CKD stage III, A-flutter, chronic anticoagulation Eliquis, diastolic CHF, DVT, compression fracture of L2 lumbar vertebrae presents to the emergency department via EMS from rehab with concerns over low hemoglobin.  Patient from [facility] rehab and [facility] where hemoglobin was reported to be 5.8.  Patient does endorse some lightheadedness and shortness of breath with exertion but denies chest pain, abdominal pain, nausea, vomiting.  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  She does endorse dark stools but also endorses taking iron supplementation.   Abnormal Lab      Home Medications Prior to Admission medications   Medication Sig Start Date End Date Taking? Authorizing Provider  allopurinol (ZYLOPRIM) 300 MG tablet Take 300 mg by mouth daily.  05/06/15  Yes [provider]  amiodarone (PACERONE) 200 MG tablet Take 1 tablet (200 mg total) by mouth daily. NEED APPOINTMENT 02/15/23  Yes Croitoru, Mihai, MD  atorvastatin (LIPITOR) 20 MG tablet Take 1 tablet (20 mg total) by mouth daily. 12/19/22  Yes Alver Sorrow, NP  calcium carbonate (OS-CAL - DOSED IN MG OF ELEMENTAL CALCIUM) 1250 (500 Ca) MG tablet Take 1 tablet by mouth daily.   Yes [provider]  cholecalciferol (VITAMIN D) 1000 units tablet Take 1,000 Units by mouth daily.   Yes [provider]  ferrous sulfate 325 (65 FE) MG tablet Take 325 mg by mouth daily with breakfast.   Yes [provider]  folic acid (FOLVITE) 1 MG tablet Take 1 tablet (1 mg total) by mouth daily. 05/18/23  Yes Osvaldo Shipper, MD  furosemide (LASIX) 20 MG tablet Take 20 mg by  mouth daily. 05/03/23  Yes [provider]  levothyroxine (SYNTHROID) 88 MCG tablet Take 1 tablet (88 mcg total) by mouth daily at 6 (six) AM. 05/19/23  Yes Osvaldo Shipper, MD  vitamin B-12 (CYANOCOBALAMIN) 1000 MCG tablet Take 1,000 mcg by mouth daily.   Yes [provider]  acetaminophen (TYLENOL) 325 MG tablet Take 650 mg by mouth every 6 (six) hours as needed for mild pain (pain score 1-3) or moderate pain (pain score 4-6).    [provider]  apixaban (ELIQUIS) 5 MG TABS tablet Take 1 tablet (5 mg total) by mouth 2 (two) times daily. Please resume on 05/17/2023. 05/21/23   Osvaldo Shipper, MD  busPIRone (BUSPAR) 10 MG tablet Take 1 tablet by mouth 2 (two) times daily. 07/18/18   [provider]  feeding supplement (ENSURE ENLIVE / ENSURE PLUS) LIQD Take 237 mLs by mouth 3 (three) times daily between meals. 05/18/23   Osvaldo Shipper, MD  KLOR-CON M20 20 MEQ tablet Take 20 mEq by mouth daily.    [provider]  lidocaine (LIDODERM) 5 % Place 2 patches onto the skin daily. Remove & Discard patch within 12 hours or as directed by MD 05/18/23   Osvaldo Shipper, MD  melatonin 3 MG TABS tablet Take 1 tablet (3 mg total) by mouth at bedtime as needed (insomnia). 05/18/23   Osvaldo Shipper, MD  methocarbamol (ROBAXIN) 500 MG tablet Take 1 tablet (500 mg total) by mouth every 6 (six) hours as needed for muscle  spasms. 05/18/23   Osvaldo Shipper, MD  oxyCODONE (OXY IR/ROXICODONE) 5 MG immediate release tablet Take 1-2 tablets (5-10 mg total) by mouth every 4 (four) hours as needed for severe pain (pain score 7-10) or moderate pain (pain score 4-6). 05/18/23   Osvaldo Shipper, MD  pantoprazole (PROTONIX) 40 MG tablet Take 1 tablet (40 mg total) by mouth 2 (two) times daily before a meal. 05/18/23   Osvaldo Shipper, MD  senna-docusate (SENOKOT-S) 8.6-50 MG tablet Take 1 tablet by mouth 2 (two) times daily. 05/18/23   Osvaldo Shipper, MD  sertraline (ZOLOFT) 25 MG  tablet Take 25 mg by mouth daily.  07/07/17   [provider]      Allergies    Codeine and Tape    Review of Systems   Review of Systems  Physical Exam Updated Vital Signs BP (!) 155/79   Pulse 64   Temp 97.8 F (36.6 C) (Oral)   Resp 18   Ht 5' (1.524 m)   Wt 88.4 kg   SpO2 98%   BMI 38.06 kg/m  Physical Exam Vitals and nursing note reviewed.  Constitutional:      General: She is not in acute distress.    Appearance: She is well-developed.  HENT:     Head: Normocephalic and atraumatic.  Eyes:     Conjunctiva/sclera: Conjunctivae normal.  Cardiovascular:     Rate and Rhythm: Normal rate and regular rhythm.     Heart sounds: No murmur heard. Pulmonary:     Effort: Pulmonary effort is normal. No respiratory distress.     Breath sounds: Normal breath sounds.  Abdominal:     Palpations: Abdomen is soft.     Tenderness: There is no abdominal tenderness.  Genitourinary:    Rectum: Guaiac result positive.  Musculoskeletal:        General: No swelling.     Cervical back: Neck supple.     Right lower leg: No edema.     Left lower leg: No edema.  Skin:    General: Skin is warm and dry.     Capillary Refill: Capillary refill takes less than 2 seconds.     Coloration: Skin is pale.  Neurological:     Mental Status: She is alert.  Psychiatric:        Mood and Affect: Mood normal.     ED Results / Procedures / Treatments   Labs (all labs ordered are listed, but only abnormal results are displayed) Labs Reviewed  CBC WITH DIFFERENTIAL/PLATELET - Abnormal; Notable for the following components:      Result Value   RBC 1.95 (*)    Hemoglobin 5.9 (*)    HCT 20.4 (*)    MCV 104.6 (*)    MCHC 28.9 (*)    RDW 18.1 (*)    Platelets 100 (*)    All other components within normal limits  BASIC METABOLIC PANEL - Abnormal; Notable for the following components:   Potassium 3.4 (*)    BUN 29 (*)    Calcium 8.4 (*)    All other components within normal limits   POC OCCULT BLOOD, ED - Abnormal; Notable for the following components:   Fecal Occult Bld POSITIVE (*)    All other components within normal limits  TYPE AND SCREEN  PREPARE RBC (CROSSMATCH)  ABO/RH    EKG EKG Interpretation Date/Time:  Tuesday June 20 2023 04:05:07 EST Ventricular Rate:  68 PR Interval:  156 QRS Duration:  111 QT Interval:  517 QTC Calculation: 550 R Axis:   -29  Text Interpretation: Normal sinus rhythm flutter artifact Inferior infarct, old Prolonged QT interval Confirmed by Kommor, [place] [phone]) on 06/20/2023 5:36:37 AM  Radiology No results found.  Procedures .Critical Care  Performed by: Darrick Grinder, PA-C Authorized by: Darrick Grinder, PA-C   Critical care provider statement:    Critical care time (minutes):  35   Critical care was necessary to treat or prevent imminent or life-threatening deterioration of the following conditions:  Circulatory failure (Symptomatic anemia with hemoglobin <6)   Critical care was time spent personally by me on the following activities:  Development of treatment plan with patient or surrogate, discussions with consultants, evaluation of patient's response to treatment, examination of patient, ordering and review of laboratory studies, ordering and review of radiographic studies, ordering and performing treatments and interventions, pulse oximetry, re-evaluation of patient's condition and review of old charts   Care discussed with: admitting provider       Medications Ordered in ED Medications  0.9 %  sodium chloride infusion (Manually program via Guardrails IV Fluids) (has no administration in time range)  pantoprazole (PROTONIX) injection 80 mg (has no administration in time range)    ED Course/ Medical Decision Making/ A&P                                 Medical Decision Making Amount and/or Complexity of Data Reviewed Labs: ordered.   This patient presents to the ED for concern of low hemoglobin,  this involves an extensive number of treatment options, and is a complaint that carries with it a high risk of complications and morbidity.  The differential diagnosis includes GI bleed, chronic anemia, others   Co morbidities that complicate the patient evaluation  Bleeding duodenal mass, Eliquis usage   Additional history obtained:  Additional history obtained from EMS External records from outside source obtained and reviewed including discharge summary   Lab Tests:  I Ordered, and personally interpreted labs.  The pertinent results include: Hemoglobin 5.9, positive fecal occult blood test    Cardiac Monitoring: / EKG:  The patient was maintained on a cardiac monitor.  I personally viewed and interpreted the cardiac monitored which showed an underlying rhythm of: Sinus rhythm   Consultations Obtained:  I requested consultation with the hospitalist, Dr.Segars,  and discussed lab and imaging findings as well as pertinent plan - they recommend: admission  I messaged Dr. Adela Lank, [facility], to ask to have the patient added to his rounding list   Problem List / ED Course / Critical interventions / Medication management   I ordered medication including 2 units PRBC, IV Protonix for symptomatic anemia  Reevaluation of the patient after these medicines showed that the patient improved I have reviewed the patients home medicines and have made adjustments as needed   Test / Admission - Considered:  Patient with symptomatic anemia in the setting of a known bleeding duodenal mass.  Patient will need admission for stabilization of her hemoglobin with blood transfusions ordered.          Final Clinical Impression(s) / ED Diagnoses Final diagnoses:  Symptomatic anemia    Rx / DC Orders ED Discharge Orders     None         Pamala Duffel 06/20/23 0604    Glendora Score, MD 06/21/23 [phone]

## 2023-06-20 NOTE — Consult Note (Addendum)
Consultation  Referring Provider: ER MD Elie Confer PA-C Primary Care Physician:  Wilfred Curtis, MD Primary Gastroenterologist:  Dr. Adela Lank  Reason for Consultation: Profound anemia, heme positive stool  HPI: Peggy Armstrong is a 77 y.o. female, with history of hypertension, chronic diastolic heart failure, rheumatoid arthritis, prior history of DVT, and atrial fibrillation for which she is on chronic Eliquis, hypothyroidism and GERD. She has history of an ampullary polyp involving the major papilla diagnosed in 2018 for which she underwent ampullectomy in 2019 per Dr. Tawanna Cooler Barron/[facility]/endoscopically.  Path at that time showed a severely cauterized adenomatous polyp with intestinal differentiation containing extensive high-grade dysplasia, intramucosal adenocarcinoma and focal areas highly suspicious for submucosal invasion, margin indeterminant, and further evaluation limited by cautery effect.  She had ERCP/EUS February 2019/Dr. Barron's finding of visibly patent stent from the biliary tree tissue destruction in the area of the papilla with APC and EUS showed normal appearing stomach no significant pathology in the pancreatic head. She also had follow-up EGD May 2019 Dr. Jamse Mead with finding of a normal esophagus, normal stomach, evidence of the prior ampullectomy and a single duodenal polyp was removed  Also with history of iron deficiency anemia, and chronic kidney disease stage III.  She was initially seen in mid November 2024 when she was hospitalized after a lumbar fracture, and found to have a hemoglobin in the 7 range with melenic appearing stool. 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  Referral has been made to Dr. Corliss Parish at [facility] for resection of the ampullary adenoma.  Apparently she needed to cardiology preoperative clearance, and that has not been obtained as yet and therefore procedure is not scheduled according to her daughter.  They are somewhat frustrated.  Her hemoglobin was 8.0 when she left the hospital..  Eliquis was resumed. She has been residing at an assisted living for therapy since that time. Routine labs had been drawn yesterday, and then late last night returned showing hemoglobin of 5.9 and she was brought immediately to the emergency room. She has not been aware of any melena or hematochezia, says her stools stay somewhat dark appearing on iron but she has not noticed any difference over the past few weeks.  She has no complaints of abdominal pain, no nausea or vomiting.  Last dose of Eliquis was yesterday.  Labs today WBC 4.9/hemoglobin 5.9/hematocrit 20.4/MCV 104.6/platelets 100 Potassium 3.4/BUN 29/creatinine 0.73  She has received 1 unit of packed RBCs and is awaiting the second.   Past Medical History:  Diagnosis Date   Acquired hypothyroidism    Atrial fibrillation (HCC)    DVT (deep venous thrombosis) (HCC)    right DVT   GAD (generalized anxiety disorder)    GERD (gastroesophageal reflux disease)    Gout    Gout    Hypertension    RA (rheumatoid arthritis) (HCC)     Past Surgical History:  Procedure Laterality Date   ABDOMINAL HYSTERECTOMY     BIOPSY  05/16/2023   Procedure: BIOPSY;  Surgeon: Meryl Dare, MD;  Location:  [facility];  Service: Gastroenterology;;   CHOLECYSTECTOMY     ENDOSCOPIC RETROGRADE CHOLANGIOPANCREATOGRAPHY (ERCP) WITH PROPOFOL N/A 06/15/2017   Procedure: ENDOSCOPIC RETROGRADE CHOLANGIOPANCREATOGRAPHY  (ERCP) WITH PROPOFOL;  Surgeon: Rachael Fee, MD;  Location: [facility];  Service: Endoscopy;  Laterality: N/A;   ESOPHAGOGASTRODUODENOSCOPY (EGD) WITH PROPOFOL N/A 03/24/2017   Procedure: ESOPHAGOGASTRODUODENOSCOPY (EGD) WITH PROPOFOL;  Surgeon: Sherrilyn Rist, MD;  Location: [facility];  Service: Gastroenterology;  Laterality: N/A;   ESOPHAGOGASTRODUODENOSCOPY (EGD) WITH PROPOFOL N/A 11/07/2017   Procedure: ESOPHAGOGASTRODUODENOSCOPY (EGD) WITH PROPOFOL;  Surgeon: Rachael Fee, MD;  Location: [facility];  Service: Endoscopy;  Laterality: N/A;   ESOPHAGOGASTRODUODENOSCOPY (EGD) WITH PROPOFOL N/A 05/16/2023   Procedure: ESOPHAGOGASTRODUODENOSCOPY (EGD) WITH PROPOFOL;  Surgeon: Meryl Dare, MD;  Location: [facility];  Service: Gastroenterology;  Laterality: N/A;   EUS N/A 04/06/2017   Procedure: UPPER ENDOSCOPIC ULTRASOUND (EUS) LINEAR;  Surgeon: Rachael Fee, MD;  Location: [facility];  Service: Endoscopy;  Laterality: N/A;  to evaluate duodenal mass   HERNIA REPAIR     IR BILIARY DRAIN PLACEMENT WITH CHOLANGIOGRAM  03/25/2017   IR CONVERT BILIARY DRAIN TO INT EXT BILIARY DRAIN  04/03/2017   POLYPECTOMY  05/16/2023   Procedure: POLYPECTOMY;  Surgeon: Meryl Dare, MD;  Location: [facility];  Service: Gastroenterology;;    Prior to Admission medications   Medication Sig Start Date End Date Taking? Authorizing Provider  allopurinol (ZYLOPRIM) 300 MG tablet Take 300 mg by mouth daily.  05/06/15  Yes [provider]  amiodarone (PACERONE) 200 MG tablet Take 1 tablet (200 mg total) by mouth daily. NEED APPOINTMENT 02/15/23  Yes Croitoru, Mihai, MD  atorvastatin (LIPITOR) 20 MG tablet Take 1 tablet (20 mg total) by mouth daily. 12/19/22  Yes Alver Sorrow, NP  calcium carbonate (OS-CAL - DOSED IN MG OF ELEMENTAL CALCIUM) 1250 (500 Ca) MG tablet Take 1 tablet by mouth daily.   Yes [provider]  cholecalciferol (VITAMIN D) 1000 units tablet Take  1,000 Units by mouth daily.   Yes [provider]  ferrous sulfate 325 (65 FE) MG tablet Take 325 mg by mouth daily with breakfast.   Yes [provider]  folic acid (FOLVITE) 1 MG tablet Take 1 tablet (1 mg total) by mouth daily. 05/18/23  Yes Osvaldo Shipper, MD  furosemide (LASIX) 20 MG tablet Take 20 mg by mouth daily. 05/03/23  Yes [provider]  levothyroxine (SYNTHROID) 88 MCG tablet Take 1 tablet (88 mcg total) by mouth daily at 6 (six) AM. 05/19/23  Yes Osvaldo Shipper, MD  vitamin B-12 (CYANOCOBALAMIN) 1000 MCG tablet Take 1,000 mcg by mouth daily.   Yes [provider]  acetaminophen (TYLENOL) 325 MG tablet Take 650 mg by mouth every 6 (six) hours as needed for mild pain (pain score 1-3) or moderate pain (pain score 4-6).    [provider]  apixaban (ELIQUIS) 5 MG TABS tablet Take 1 tablet (5 mg total) by mouth 2 (two) times daily. Please resume on 05/17/2023. 05/21/23   Osvaldo Shipper, MD  busPIRone (BUSPAR) 10 MG tablet Take 1 tablet by mouth 2 (two) times daily. 07/18/18   [provider]  feeding supplement (ENSURE ENLIVE / ENSURE PLUS) LIQD Take 237 mLs by mouth 3 (three) times daily between meals. 05/18/23   Osvaldo Shipper, MD  KLOR-CON M20 20 MEQ tablet Take 20 mEq by mouth daily.    [provider]  lidocaine (LIDODERM) 5 % Place 2 patches onto the  skin daily. Remove & Discard patch within 12 hours or as directed by MD 05/18/23   Osvaldo Shipper, MD  melatonin 3 MG TABS tablet Take 1 tablet (3 mg total) by mouth at bedtime as needed (insomnia). 05/18/23   Osvaldo Shipper, MD  methocarbamol (ROBAXIN) 500 MG tablet Take 1 tablet (500 mg total) by mouth every 6 (six) hours as needed for muscle spasms. 05/18/23   Osvaldo Shipper, MD  oxyCODONE (OXY IR/ROXICODONE) 5 MG immediate release tablet Take 1-2 tablets (5-10 mg total) by mouth every 4 (four) hours as needed for severe pain (pain score 7-10) or moderate pain (pain  score 4-6). 05/18/23   Osvaldo Shipper, MD  pantoprazole (PROTONIX) 40 MG tablet Take 1 tablet (40 mg total) by mouth 2 (two) times daily before a meal. 05/18/23   Osvaldo Shipper, MD  senna-docusate (SENOKOT-S) 8.6-50 MG tablet Take 1 tablet by mouth 2 (two) times daily. 05/18/23   Osvaldo Shipper, MD  sertraline (ZOLOFT) 25 MG tablet Take 25 mg by mouth daily.  07/07/17   [provider]    Current Facility-Administered Medications  Medication Dose Route Frequency Provider Last Rate Last Admin   0.9 %  sodium chloride infusion (Manually program via Guardrails IV Fluids)   Intravenous Once McCauley, Larry B, PA-C       acetaminophen (TYLENOL) tablet 650 mg  650 mg Oral Q6H PRN Kirby Crigler, Mir M, MD       Or   acetaminophen (TYLENOL) suppository 650 mg  650 mg Rectal Q6H PRN Kirby Crigler, Mir M, MD       albuterol (PROVENTIL) (2.5 MG/3ML) 0.083% nebulizer solution 2.5 mg  2.5 mg Nebulization Q2H PRN Kirby Crigler, Mir M, MD       amiodarone (PACERONE) tablet 200 mg  200 mg Oral Daily Kirby Crigler, Mir M, MD       atorvastatin (LIPITOR) tablet 20 mg  20 mg Oral Daily Kirby Crigler, Mir M, MD       methocarbamol (ROBAXIN) tablet 500 mg  500 mg Oral Q6H PRN Kirby Crigler, Mir M, MD       ondansetron [facility]) tablet 4 mg  4 mg Oral Q6H PRN Kirby Crigler, Mir M, MD       Or   ondansetron [facility]) injection 4 mg  4 mg Intravenous Q6H PRN Kirby Crigler, Mir M, MD       oxyCODONE (Oxy IR/ROXICODONE) immediate release tablet 5-10 mg  5-10 mg Oral Q4H PRN Kirby Crigler, Mir M, MD       pantoprazole (PROTONIX) injection 40 mg  40 mg Intravenous Q12H Kirby Crigler, Mir M, MD       traZODone (DESYREL) tablet 50 mg  50 mg Oral QHS PRN Kirby Crigler, Mir M, MD       Current Outpatient Medications  Medication Sig Dispense Refill   allopurinol (ZYLOPRIM) 300 MG tablet Take 300 mg by mouth daily.      amiodarone (PACERONE) 200 MG tablet Take 1 tablet (200 mg total) by mouth daily. NEED APPOINTMENT 30 tablet 0   atorvastatin  (LIPITOR) 20 MG tablet Take 1 tablet (20 mg total) by mouth daily. 60 tablet 0   calcium carbonate (OS-CAL - DOSED IN MG OF ELEMENTAL CALCIUM) 1250 (500 Ca) MG tablet Take 1 tablet by mouth daily.     cholecalciferol (VITAMIN D) 1000 units tablet Take 1,000 Units by mouth daily.     ferrous sulfate 325 (65 FE) MG tablet Take 325 mg by mouth daily with breakfast.     folic acid (FOLVITE) 1 MG  tablet Take 1 tablet (1 mg total) by mouth daily.     furosemide (LASIX) 20 MG tablet Take 20 mg by mouth daily.     levothyroxine (SYNTHROID) 88 MCG tablet Take 1 tablet (88 mcg total) by mouth daily at 6 (six) AM.     vitamin B-12 (CYANOCOBALAMIN) 1000 MCG tablet Take 1,000 mcg by mouth daily.     acetaminophen (TYLENOL) 325 MG tablet Take 650 mg by mouth every 6 (six) hours as needed for mild pain (pain score 1-3) or moderate pain (pain score 4-6).     apixaban (ELIQUIS) 5 MG TABS tablet Take 1 tablet (5 mg total) by mouth 2 (two) times daily. Please resume on 05/17/2023.     busPIRone (BUSPAR) 10 MG tablet Take 1 tablet by mouth 2 (two) times daily.     feeding supplement (ENSURE ENLIVE / ENSURE PLUS) LIQD Take 237 mLs by mouth 3 (three) times daily between meals.     KLOR-CON M20 20 MEQ tablet Take 20 mEq by mouth daily.     lidocaine (LIDODERM) 5 % Place 2 patches onto the skin daily. Remove & Discard patch within 12 hours or as directed by MD     melatonin 3 MG TABS tablet Take 1 tablet (3 mg total) by mouth at bedtime as needed (insomnia).     methocarbamol (ROBAXIN) 500 MG tablet Take 1 tablet (500 mg total) by mouth every 6 (six) hours as needed for muscle spasms. 60 tablet 0   oxyCODONE (OXY IR/ROXICODONE) 5 MG immediate release tablet Take 1-2 tablets (5-10 mg total) by mouth every 4 (four) hours as needed for severe pain (pain score 7-10) or moderate pain (pain score 4-6). 60 tablet 0   pantoprazole (PROTONIX) 40 MG tablet Take 1 tablet (40 mg total) by mouth 2 (two) times daily before a meal.      senna-docusate (SENOKOT-S) 8.6-50 MG tablet Take 1 tablet by mouth 2 (two) times daily.     sertraline (ZOLOFT) 25 MG tablet Take 25 mg by mouth daily.       Allergies as of 06/20/2023 - Review Complete 06/20/2023  Allergen Reaction Noted   Codeine Nausea And Vomiting 04/18/2011   Tape  06/20/2023    Family History  Problem Relation Age of Onset   Prostate cancer Father    Colon cancer Father    Hypertension Mother    Rheum arthritis Maternal Grandmother    Diabetes Paternal Grandmother    Heart disease Paternal Grandfather     Social History   Socioeconomic History   Marital status: Divorced    Spouse name: Not on file   Number of children: 2   Years of education: Not on file   Highest education level: Not on file  Occupational History   Not on file  Tobacco Use   Smoking status: Never   Smokeless tobacco: Never  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  Other Topics Concern   Not on file  Social History Narrative   Not on file   Social Drivers of Health   Financial Resource Strain: Low Risk  (12/02/2022)   Received from [facility], [facility]   Overall Financial Resource Strain (CARDIA)    Difficulty of Paying Living Expenses: Not very hard  Food Insecurity: No Food Insecurity (05/12/2023)  Hunger Vital Sign    Worried About Running Out of Food in the Last Year: Never true    Ran Out of Food in the Last Year: Never true  Transportation Needs: No Transportation Needs (05/12/2023)   PRAPARE - Administrator, Civil Service (Medical): No    Lack of Transportation (Non-Medical): No  Physical Activity: Unknown (04/05/2022)   Received from [facility], [facility]   Exercise Vital Sign    Days of Exercise per Week: 0 days    Minutes of Exercise per Session: Not on file  Stress: No Stress Concern Present  (04/05/2022)   Received from [facility], [facility] of Occupational Health - Occupational Stress Questionnaire    Feeling of Stress : Not at all  Social Connections: Unknown (04/06/2023)   Received from [facility]   Social Network    Social Network: Not on file  Intimate Partner Violence: Not At Risk (05/12/2023)   Humiliation, Afraid, Rape, and Kick questionnaire    Fear of Current or Ex-Partner: No    Emotionally Abused: No    Physically Abused: No    Sexually Abused: No    Review of Systems: Pertinent positive and negative review of systems were noted in the above HPI section.  All other review of systems was otherwise negative.   Physical Exam: Vital signs in last 24 hours: Temp:  [97.1 F (36.2 C)-98.3 F (36.8 C)] 97.7 F (36.5 C) (12/24 1025) Pulse Rate:  [59-67] 59 (12/24 1030) Resp:  [13-28] 23 (12/24 1030) BP: (129-167)/(66-96) 167/75 (12/24 1030) SpO2:  [96 %-100 %] 100 % (12/24 1030) Weight:  [88.4 kg] 88.4 kg (12/24 0408)   General:   Alert,  Well-developed, elderly white female pleasant and cooperative in NAD, daughter at bedside Head:  Normocephalic and atraumatic. Eyes:  Sclera clear, no icterus.   Conjunctiva pale Ears:  Normal auditory acuity. Nose:  No deformity, discharge,  or lesions. Mouth:  No deformity or lesions.   Neck:  Supple; no masses or thyromegaly. Lungs:  Clear throughout to auscultation.   No wheezes, crackles, or rhonchi. Heart:  Irregular rate and rhythm; no murmurs, clicks, rubs,  or gallops. Abdomen:  Soft,nontender, BS active,nonpalp mass or hsm.   Rectal: Not done heme positive per ER provider Msk:  Symmetrical without gross deformities. . Pulses:  Normal pulses noted. Extremities:  Without clubbing or edema. Neurologic:  Alert and  oriented x4;  grossly normal neurologically. Skin:  Intact without significant lesions or rashes.. Psych:  Alert and cooperative. Normal mood and affect.  Intake/Output  from previous day: No intake/output data recorded. Intake/Output this shift: No intake/output data recorded.  Lab Results: Recent Labs    06/20/23 0439  WBC 4.9  HGB 5.9*  HCT 20.4*  PLT 100*   BMET Recent Labs    06/20/23 0439  NA 140  K 3.4*  CL 107  CO2 25  GLUCOSE 87  BUN 29*  CREATININE 0.73  CALCIUM 8.4*   LFT No results for input(s): "PROT", "ALBUMIN", "AST", "ALT", "ALKPHOS", "BILITOT", "BILIDIR", "IBILI" in the last 72 hours. PT/INR No results for input(s): "LABPROT", "INR" in the last 72 hours. Hepatitis Panel No results for input(s): "HEPBSAG", "HCVAB", "HEPAIGM", "HEPBIGM" in the last 72 hours.   IMPRESSION:  #20 77 year old female, on chronic Eliquis with acute worsening of chronic anemia with hemoglobin of 5.9 on outpatient labs yesterday. This is down from hemoglobin of 8 about 1 month ago.  Patient has  a known large friable duodenal adenoma found at EGD during admission 1 month ago when she also had presented with anemia and melena.  She has been referred to Dr. Corliss Parish at [facility] for endoscopic resection of the duodenal adenoma.  Apparently procedure appointment time has not been scheduled as yet as [facility] was waiting for cardiology clearance here in [place].  #2  History of atrial fibrillation/flutter-on chronic Eliquis #3  Prior history of DVT #4  Rheumatoid arthritis #5  Chronic kidney disease stage III #6  Congestive heart failure-no recent echo #7  Iron deficiency anemia followed by [facility] hematology #8  History of ampullary adenocarcinoma S/P ampullectomy 2019, Dr. Jamse Mead at [facility]: Transfuse to keep hemoglobin closer to 8 given advanced age and symptomatic state Hold Eliquis for now Cardiology consultation while inpatient, so we can try to expedite her cardiac clearance and expedite procedure appointment for resection of the duodenal adenoma at [facility]. No plans for repeat EGD at this time Have ordered soft diet  Amy Esterwood PA-C  06/20/2023, 10:46 AM   Attending Physician Note   I have taken a history, reviewed the chart and examined the patient. I performed a substantive portion of this encounter, including complete performance of at least one of the key components, in conjunction with the APP. I agree with the APP's note, impression and recommendations with my edits. My additional impressions and recommendations are as follows.   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 Blood loss exacerbated by Eliquis. History of ampullary adenocarcinoma S/P endoscopic ampullectomy by Dr. Corliss Parish in 2019.   *No plans for repeat EGD at this time. See EGD report from Nov 2024.  *Consider IV iron in hospital - defer to primary service. *Cardiology consult appreciated. Consider continuing to hold Eliquis until appt with Dr. Edyth Gunnels - defer to Cardiology and primary service.  *Appt with Dr. Corliss Parish at [facility] as planned for mgmt of duodenal mass *Local outpatient GI follow up with Dr. Adela Lank *GI signing off, available if needed  Claudette Head, MD [facility] See [facility], [facility], for our on call provider

## 2023-06-20 NOTE — H&P (Signed)
History and Physical  Peggy Armstrong ZOX:[id number] DOB: 25-Feb-1946 DOA: 06/20/2023  PCP: Wilfred Curtis, MD   Chief Complaint: low Hb   HPI: Peggy Armstrong is a 77 y.o. female with medical history significant for HTN, HLD, chronic atrial fibrillation on Eliquis, heart failure with preserved EF being admitted to the hospital with recurrent blood loss anemia.  She was recently hospitalized at [facility] 11/14 to 05/18/2023 for weakness, back pain, found to have L2 compression fracture and recurrent anemia due to GI blood loss.  She had upper endoscopy with a couple of gastric polyps removed, and evidence of recurrent benign duodenal mass.  Patient's hemoglobin remained stable, and she was cleared by GI to resume her Eliquis 3 days after hospital discharge.  She has done so, and since her last hospital discharge has been at rehab and tells me that she has been doing well.  Has been ambulating more and more, at the time of hospital discharge she was not able to walk even a few steps.  She denies any chest pain, shortness of breath, dizziness, is unaware of any GI blood loss though she admits she does not check her stools.  She has had some routine labs done at her rehab, where hemoglobin was reported to be 5.8.  She was therefore brought to the ER for evaluation.  She has been transfused 2 units of blood, GI was consulted, and hospitalist was contacted for admission.  Review of Systems: Please see HPI for pertinent positives and negatives. A complete 10 system review of systems are otherwise negative.  Past Medical History:  Diagnosis Date   Acquired hypothyroidism    Atrial fibrillation (HCC)    DVT (deep venous thrombosis) (HCC)    right DVT   GAD (generalized anxiety disorder)    GERD (gastroesophageal reflux disease)    Gout    Gout    Hypertension    RA (rheumatoid arthritis) (HCC)    Past Surgical History:  Procedure Laterality Date   ABDOMINAL HYSTERECTOMY     BIOPSY  05/16/2023    Procedure: BIOPSY;  Surgeon: Meryl Dare, MD;  Location: [facility];  Service: Gastroenterology;;   CHOLECYSTECTOMY     ENDOSCOPIC RETROGRADE CHOLANGIOPANCREATOGRAPHY (ERCP) WITH PROPOFOL N/A 06/15/2017   Procedure: ENDOSCOPIC RETROGRADE CHOLANGIOPANCREATOGRAPHY (ERCP) WITH PROPOFOL;  Surgeon: Rachael Fee, MD;  Location: [facility];  Service: Endoscopy;  Laterality: N/A;   ESOPHAGOGASTRODUODENOSCOPY (EGD) WITH PROPOFOL N/A 03/24/2017   Procedure: ESOPHAGOGASTRODUODENOSCOPY (EGD) WITH PROPOFOL;  Surgeon: Sherrilyn Rist, MD;  Location: [facility];  Service: Gastroenterology;  Laterality: N/A;   ESOPHAGOGASTRODUODENOSCOPY (EGD) WITH PROPOFOL N/A 11/07/2017   Procedure: ESOPHAGOGASTRODUODENOSCOPY (EGD) WITH PROPOFOL;  Surgeon: Rachael Fee, MD;  Location: [facility];  Service: Endoscopy;  Laterality: N/A;   ESOPHAGOGASTRODUODENOSCOPY (EGD) WITH PROPOFOL N/A 05/16/2023   Procedure: ESOPHAGOGASTRODUODENOSCOPY (EGD) WITH PROPOFOL;  Surgeon: Meryl Dare, MD;  Location: [facility];  Service: Gastroenterology;  Laterality: N/A;   EUS N/A 04/06/2017   Procedure: UPPER ENDOSCOPIC ULTRASOUND (EUS) LINEAR;  Surgeon: Rachael Fee, MD;  Location: [facility];  Service: Endoscopy;  Laterality: N/A;  to evaluate duodenal mass   HERNIA REPAIR     IR BILIARY DRAIN PLACEMENT WITH CHOLANGIOGRAM  03/25/2017   IR CONVERT BILIARY DRAIN TO INT EXT BILIARY DRAIN  04/03/2017   POLYPECTOMY  05/16/2023   Procedure: POLYPECTOMY;  Surgeon: Meryl Dare, MD;  Location: [facility];  Service: Gastroenterology;;   Social History:  reports that she has never  smoked. She has never used smokeless tobacco. She reports that she does not drink alcohol and does not use drugs.  Allergies  Allergen Reactions   Codeine Nausea And Vomiting    Made "very sick"   Tape     Family History  Problem Relation Age of Onset   Prostate cancer Father    Colon cancer Father    Hypertension Mother     Rheum arthritis Maternal Grandmother    Diabetes Paternal Grandmother    Heart disease Paternal Grandfather      Prior to Admission medications   Medication Sig Start Date End Date Taking? Authorizing Provider  allopurinol (ZYLOPRIM) 300 MG tablet Take 300 mg by mouth daily.  05/06/15  Yes [provider]  amiodarone (PACERONE) 200 MG tablet Take 1 tablet (200 mg total) by mouth daily. NEED APPOINTMENT 02/15/23  Yes Croitoru, Mihai, MD  atorvastatin (LIPITOR) 20 MG tablet Take 1 tablet (20 mg total) by mouth daily. 12/19/22  Yes Alver Sorrow, NP  calcium carbonate (OS-CAL - DOSED IN MG OF ELEMENTAL CALCIUM) 1250 (500 Ca) MG tablet Take 1 tablet by mouth daily.   Yes [provider]  cholecalciferol (VITAMIN D) 1000 units tablet Take 1,000 Units by mouth daily.   Yes [provider]  ferrous sulfate 325 (65 FE) MG tablet Take 325 mg by mouth daily with breakfast.   Yes [provider]  folic acid (FOLVITE) 1 MG tablet Take 1 tablet (1 mg total) by mouth daily. 05/18/23  Yes Osvaldo Shipper, MD  furosemide (LASIX) 20 MG tablet Take 20 mg by mouth daily. 05/03/23  Yes [provider]  levothyroxine (SYNTHROID) 88 MCG tablet Take 1 tablet (88 mcg total) by mouth daily at 6 (six) AM. 05/19/23  Yes Osvaldo Shipper, MD  vitamin B-12 (CYANOCOBALAMIN) 1000 MCG tablet Take 1,000 mcg by mouth daily.   Yes [provider]  acetaminophen (TYLENOL) 325 MG tablet Take 650 mg by mouth every 6 (six) hours as needed for mild pain (pain score 1-3) or moderate pain (pain score 4-6).    [provider]  apixaban (ELIQUIS) 5 MG TABS tablet Take 1 tablet (5 mg total) by mouth 2 (two) times daily. Please resume on 05/17/2023. 05/21/23   Osvaldo Shipper, MD  busPIRone (BUSPAR) 10 MG tablet Take 1 tablet by mouth 2 (two) times daily. 07/18/18   [provider]  feeding supplement (ENSURE ENLIVE / ENSURE PLUS) LIQD Take 237 mLs by mouth 3 (three) times  daily between meals. 05/18/23   Osvaldo Shipper, MD  KLOR-CON M20 20 MEQ tablet Take 20 mEq by mouth daily.    [provider]  lidocaine (LIDODERM) 5 % Place 2 patches onto the skin daily. Remove & Discard patch within 12 hours or as directed by MD 05/18/23   Osvaldo Shipper, MD  melatonin 3 MG TABS tablet Take 1 tablet (3 mg total) by mouth at bedtime as needed (insomnia). 05/18/23   Osvaldo Shipper, MD  methocarbamol (ROBAXIN) 500 MG tablet Take 1 tablet (500 mg total) by mouth every 6 (six) hours as needed for muscle spasms. 05/18/23   Osvaldo Shipper, MD  oxyCODONE (OXY IR/ROXICODONE) 5 MG immediate release tablet Take 1-2 tablets (5-10 mg total) by mouth every 4 (four) hours as needed for severe pain (pain score 7-10) or moderate pain (pain score 4-6). 05/18/23   Osvaldo Shipper, MD  pantoprazole (PROTONIX) 40 MG tablet Take 1 tablet (40 mg total) by mouth 2 (two) times daily before  a meal. 05/18/23   Osvaldo Shipper, MD  senna-docusate (SENOKOT-S) 8.6-50 MG tablet Take 1 tablet by mouth 2 (two) times daily. 05/18/23   Osvaldo Shipper, MD  sertraline (ZOLOFT) 25 MG tablet Take 25 mg by mouth daily.  07/07/17   [provider]    Physical Exam: BP (!) 129/96   Pulse 63   Temp 97.8 F (36.6 C) (Oral)   Resp 13   Ht 5' (1.524 m)   Wt 88.4 kg   SpO2 96%   BMI 38.06 kg/m  General:  Alert, oriented, calm, in no acute distress, her daughter is at the bedside Eyes: EOMI, clear conjuctivae, white sclerea Neck: supple, no masses, trachea mildline  Cardiovascular: RRR, no murmurs or rubs, no peripheral edema  Respiratory: clear to auscultation bilaterally, no wheezes, no crackles  Abdomen: soft, nontender, nondistended, normal bowel tones heard  Skin: dry, no rashes  Musculoskeletal: no joint effusions, normal range of motion  Psychiatric: appropriate affect, normal speech  Neurologic: extraocular muscles intact, clear speech, moving all extremities with intact sensorium          Labs on Admission:  Basic Metabolic Panel: Recent Labs  Lab 06/20/23 0439  NA 140  K 3.4*  CL 107  CO2 25  GLUCOSE 87  BUN 29*  CREATININE 0.73  CALCIUM 8.4*   Liver Function Tests: No results for input(s): "AST", "ALT", "ALKPHOS", "BILITOT", "PROT", "ALBUMIN" in the last 168 hours. No results for input(s): "LIPASE", "AMYLASE" in the last 168 hours. No results for input(s): "AMMONIA" in the last 168 hours. CBC: Recent Labs  Lab 06/20/23 0439  WBC 4.9  NEUTROABS 3.6  HGB 5.9*  HCT 20.4*  MCV 104.6*  PLT 100*   Cardiac Enzymes: No results for input(s): "CKTOTAL", "CKMB", "CKMBINDEX", "TROPONINI" in the last 168 hours. BNP (last 3 results) No results for input(s): "BNP" in the last 8760 hours.  ProBNP (last 3 results) No results for input(s): "PROBNP" in the last 8760 hours.  CBG: No results for input(s): "GLUCAP" in the last 168 hours.  Radiological Exams on Admission: No results found.  Assessment/Plan Peggy Armstrong is a 77 y.o. female with medical history significant for HTN, HLD, chronic atrial fibrillation on Eliquis, heart failure with preserved EF being admitted to the hospital with recurrent blood loss anemia.   Recurrent blood loss anemia-with hemoglobin down to 5.9, was 8.0 on last hospital discharge 1 month ago.  Patient without abdominal symptoms, unknown history of melena/hematochezia.  She did resume her Eliquis approximately 05/21/2023.  Possible bleeding from known duodenal mass versus ulceration. -Observation admission to progressive -Telemetry monitor -Transfused 2 units of blood, check post transfusion hemoglobin -Transfuse as necessary to keep hemoglobin greater than 7 -Inpatient GI consultation requested -Keep n.p.o. 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  Recent biopsy shows recurrent adenoma  Hyperlipidemia-Lipitor  Chronic thrombocytopenia-she has been followed by heme-onc at [facility], per their most recent office notes, she may need bone marrow biopsy if thrombocytopenia persists.  Will need to monitor closely in the hospital in case it worsens, recommend close outpatient follow-up, especially in the setting of anticoagulation.  DVT prophylaxis: SCDs     Code Status: Full  Code  Consults called: GI  Admission status: Observation   Time spent: 49 minutes  Kazandra Forstrom Sharlette Dense MD [facility] Pager [phone]  If 7PM-7AM, please contact night-coverage [email or web address] Password [facility]  06/20/2023, 7:56 AM

## 2023-06-21 DIAGNOSIS — D649 Anemia, unspecified: Secondary | ICD-10-CM | POA: Diagnosis not present

## 2023-06-21 DIAGNOSIS — K922 Gastrointestinal hemorrhage, unspecified: Secondary | ICD-10-CM | POA: Diagnosis not present

## 2023-06-21 LAB — BASIC METABOLIC PANEL
Anion gap: 6 (ref 5–15)
BUN: 29 mg/dL — ABNORMAL HIGH (ref 8–23)
CO2: 24 mmol/L (ref 22–32)
Calcium: 8 mg/dL — ABNORMAL LOW (ref 8.9–10.3)
Chloride: 107 mmol/L (ref 98–111)
Creatinine, Ser: 1.1 mg/dL — ABNORMAL HIGH (ref 0.44–1.00)
GFR, Estimated: 52 mL/min — ABNORMAL LOW (ref >60)
Glucose, Bld: 87 mg/dL (ref 70–99)
Potassium: 3.5 mmol/L (ref 3.5–5.1)
Sodium: 137 mmol/L (ref 135–145)

## 2023-06-21 LAB — TYPE AND SCREEN
ABO/RH(D): O POS
Antibody Screen: NEGATIVE
Unit division: 0
Unit division: 0

## 2023-06-21 LAB — CBC
HCT: 27 % — ABNORMAL LOW (ref 36.0–46.0)
Hemoglobin: 8 g/dL — ABNORMAL LOW (ref 12.0–15.0)
MCH: 30.7 pg (ref 26.0–34.0)
MCHC: 29.6 g/dL — ABNORMAL LOW (ref 30.0–36.0)
MCV: 103.4 fL — ABNORMAL HIGH (ref 80.0–100.0)
Platelets: 98 10*3/uL — ABNORMAL LOW (ref 150–400)
RBC: 2.61 MIL/uL — ABNORMAL LOW (ref 3.87–5.11)
RDW: 18.8 % — ABNORMAL HIGH (ref 11.5–15.5)
WBC: 4 10*3/uL (ref 4.0–10.5)
nRBC: 0 % (ref 0.0–0.2)

## 2023-06-21 LAB — BPAM RBC
Blood Product Expiration Date: 202501222359
Blood Product Expiration Date: 202501222359
ISSUE DATE / TIME: 202412240557
ISSUE DATE / TIME: 202412241009
Unit Type and Rh: 5100
Unit Type and Rh: 5100

## 2023-06-21 MED ORDER — PANTOPRAZOLE SODIUM 40 MG PO TBEC: 40.0000 mg | DELAYED_RELEASE_TABLET | Freq: Two times a day (BID) | ORAL | 0 refills | Status: DC

## 2023-06-21 MED ORDER — CAMPHOR-MENTHOL 0.5-0.5 % EX LOTN
TOPICAL_LOTION | CUTANEOUS | Status: DC | PRN
  Filled 2023-06-21: qty 222

## 2023-06-21 NOTE — Discharge Summary (Signed)
Physician Discharge Summary  Peggy Armstrong RSW:[id number] DOB: 05-Feb-1946 DOA: 06/20/2023  PCP: Wilfred Curtis, MD  Admit date: 06/20/2023 Discharge date: 06/21/2023  Admitted From: SNF Disposition: SNF  Recommendations for Outpatient Follow-up:  Follow up with SNF provider at earliest convenience Outpatient follow-up with GI Follow up in ED if symptoms worsen or new appear   Home Health: No Equipment/Devices: None  Discharge Condition: Stable CODE STATUS: Full Diet recommendation: Heart healthy  Brief/Interim Summary: 77 y.o. female, with history of hypertension, hyperlipidemia, chronic diastolic heart failure, rheumatoid arthritis, prior history of DVT, chronic atrial fibrillation on chronic Eliquis, hypothyroidism, GERD, recent hospitalization from 1114 [id number] for L2 compression fracture and recurrent anemia due to GI blood loss with EGD showing gastric polyps and evidence of recurrence benign duodenal mass for which she is supposed to follow-up with [facility] GI as an outpatient.  She presented with low hemoglobin of 5.8 from her facility.  She was transfused 2 unit packed red cells.  GI was consulted.  Cardiology was also consulted.    Cardiology gave clearance for duodenal surgery and was okay for Eliquis to be held for now.  Hemoglobin has remained stable.  She will be discharged back to her facility today.  Outpatient follow-up with PCP/GI/cardiology.  Discharge Diagnoses:   Acute on chronic recurrent blood loss anemia -Hemoglobin 5.9 on presentation.  Hemoglobin was 8 at the time of her last discharge. -Currently on IV Protonix. -She was transfused 2 unit packed red cells.  GI was consulted. GI recommended conservative management, hold Eliquis until appointment with [facility]/Dr. Edyth Gunnels and outpatient follow-up with GI.  -Hemoglobin 8 this morning.  Discharge patient back to her facility.  Outpatient follow-up with GI.  Will keep Eliquis on hold.  Continue PPI twice a day  orally.  Chronic atrial fibrillation -Continue amiodarone.  Currently rate is mostly controlled.  Eliquis on hold  Recurrent duodenal adenoma -Outpatient follow-up with [facility] GI  Hyperlipidemia -Continue statin  Hypothyroidism Continue levothyroxine  Anxiety/depression From continue sertraline and BuSpar  Chronic thrombocytopenia -Outpatient follow-up with heme-onc at [facility].  Hypokalemia -Improved  Obesity -Outpatient follow-up  Discharge Instructions  Discharge Instructions     Diet - low sodium heart healthy   Complete by: As directed    Increase activity slowly   Complete by: As directed       Allergies as of 06/21/2023       Reactions   Codeine Nausea And Vomiting   Made "very sick"   Tape         Medication List     STOP taking these medications    apixaban 5 MG Tabs tablet Commonly known as: ELIQUIS   Omeprazole 20 MG Tbec   oxyCODONE 5 MG immediate release tablet Commonly known as: Oxy IR/ROXICODONE       TAKE these medications    acetaminophen 325 MG tablet Commonly known as: TYLENOL Take 650 mg by mouth every 6 (six) hours as needed for mild pain (pain score 1-3) or moderate pain (pain score 4-6).   allopurinol 300 MG tablet Commonly known as: ZYLOPRIM Take 300 mg by mouth daily.   amiodarone 200 MG tablet Commonly known as: PACERONE Take 1 tablet (200 mg total) by mouth daily. NEED APPOINTMENT   Ascorbic Acid 500 MG Tbcr Take 500 mg by mouth daily.   atorvastatin 20 MG tablet Commonly known as: LIPITOR Take 1 tablet (20 mg total) by mouth daily.   busPIRone 10 MG tablet Commonly known as: BUSPAR  Take 1 tablet by mouth 2 (two) times daily.   calcium carbonate 1250 (500 Ca) MG tablet Commonly known as: OS-CAL - dosed in mg of elemental calcium Take 1 tablet by mouth daily.   cholecalciferol 1000 units tablet Commonly known as: VITAMIN D Take 1,000 Units by mouth daily.   cyanocobalamin 1000 MCG  tablet Commonly known as: VITAMIN B12 Take 1,000 mcg by mouth daily.   diphenhydrAMINE 25 mg capsule Commonly known as: BENADRYL Take 25 mg by mouth every 4 (four) hours as needed for itching.   feeding supplement Liqd Take 237 mLs by mouth 3 (three) times daily between meals.   ferrous sulfate 325 (65 FE) MG tablet Take 325 mg by mouth daily with breakfast.   folic acid 1 MG tablet Commonly known as: FOLVITE Take 1 tablet (1 mg total) by mouth daily.   furosemide 20 MG tablet Commonly known as: LASIX Take 20 mg by mouth daily.   Klor-Con M20 20 MEQ tablet Generic drug: potassium chloride SA Take 20 mEq by mouth daily.   levothyroxine 88 MCG tablet Commonly known as: SYNTHROID Take 1 tablet (88 mcg total) by mouth daily at 6 (six) AM. What changed: how much to take   lidocaine 5 % Commonly known as: LIDODERM Place 2 patches onto the skin daily. Remove & Discard patch within 12 hours or as directed by MD   melatonin 3 MG Tabs tablet Take 1 tablet (3 mg total) by mouth at bedtime as needed (insomnia).   methocarbamol 500 MG tablet Commonly known as: ROBAXIN Take 1 tablet (500 mg total) by mouth every 6 (six) hours as needed for muscle spasms.   ondansetron 4 MG tablet Commonly known as: ZOFRAN Take 4 mg by mouth every 6 (six) hours as needed for nausea or vomiting.   pantoprazole 40 MG tablet Commonly known as: PROTONIX Take 1 tablet (40 mg total) by mouth 2 (two) times daily before a meal.   predniSONE 10 MG tablet Commonly known as: DELTASONE Take 10 mg by mouth daily.   senna-docusate 8.6-50 MG tablet Commonly known as: Senokot-S Take 1 tablet by mouth 2 (two) times daily.   sertraline 25 MG tablet Commonly known as: ZOLOFT Take 25 mg by mouth daily.        Allergies  Allergen Reactions   Codeine Nausea And Vomiting    Made "very sick"   Tape     Consultations: GI/cardiology   Procedures/Studies: ECHOCARDIOGRAM COMPLETE Result Date:  06/20/2023    ECHOCARDIOGRAM REPORT   Patient Name:   Peggy Armstrong Date of Exam: 06/20/2023 Medical Rec #:  [id number]         Height:       60.0 in Accession #:    [id number]        Weight:       194.9 lb Date of Birth:  1946/03/12         BSA:          1.846 m Patient Age:    77 years          BP:           171/86 mmHg Patient Gender: F                 HR:           57 bpm. Exam Location:  Inpatient Procedure: 2D Echo, Color Doppler and Cardiac Doppler Indications:    Pre-operative cardiovascular examination  History:  Patient has prior history of Echocardiogram examinations, most                 recent 03/28/2017. Arrythmias:Atrial Flutter; Risk                 Factors:Hypertension.  Sonographer:    Vern Claude Referring Phys: [id number] Cyndi Bender IMPRESSIONS  1. Left ventricular ejection fraction, by estimation, is 55 to 60%. The left ventricle has normal function. The left ventricle has no regional wall motion abnormalities. The left ventricular internal cavity size was mildly dilated. Left ventricular diastolic parameters were normal.  2. Right ventricular systolic function is normal. The right ventricular size is normal. There is mildly elevated pulmonary artery systolic pressure.  3. Left atrial size was moderately dilated.  4. The mitral valve is degenerative. Trivial mitral valve regurgitation. No evidence of mitral stenosis.  5. The aortic valve was not well visualized. Aortic valve regurgitation is not visualized. No aortic stenosis is present.  6. The inferior vena cava is normal in size with greater than 50% respiratory variability, suggesting right atrial pressure of 3 mmHg. Comparison(s): Unable to load 2018 study. FINDINGS  Left Ventricle: Left ventricular ejection fraction, by estimation, is 55 to 60%. The left ventricle has normal function. The left ventricle has no regional wall motion abnormalities. The left ventricular internal cavity size was mildly dilated. There is  no left  ventricular hypertrophy. Left ventricular diastolic parameters were normal. Right Ventricle: The right ventricular size is normal. No increase in right ventricular wall thickness. Right ventricular systolic function is normal. There is mildly elevated pulmonary artery systolic pressure. The tricuspid regurgitant velocity is 3.15  m/s, and with an assumed right atrial pressure of 3 mmHg, the estimated right ventricular systolic pressure is 42.7 mmHg. Left Atrium: Left atrial size was moderately dilated. Right Atrium: Right atrial size was normal in size. Pericardium: There is no evidence of pericardial effusion. Mitral Valve: The mitral valve is degenerative in appearance. Trivial mitral valve regurgitation. No evidence of mitral valve stenosis. MV peak gradient, 6.4 mmHg. The mean mitral valve gradient is 2.0 mmHg. Tricuspid Valve: The tricuspid valve is normal in structure. Tricuspid valve regurgitation is not demonstrated. No evidence of tricuspid stenosis. Aortic Valve: The aortic valve was not well visualized. Aortic valve regurgitation is not visualized. No aortic stenosis is present. Aortic valve mean gradient measures 4.0 mmHg. Aortic valve peak gradient measures 8.2 mmHg. Aortic valve area, by VTI measures 1.95 cm. Pulmonic Valve: The pulmonic valve was normal in structure. Pulmonic valve regurgitation is not visualized. No evidence of pulmonic stenosis. Aorta: The aortic root and ascending aorta are structurally normal, with no evidence of dilitation. Pulmonary Artery: The pulmonary artery is of normal size. Venous: The inferior vena cava is normal in size with greater than 50% respiratory variability, suggesting right atrial pressure of 3 mmHg. IAS/Shunts: The atrial septum is grossly normal.  LEFT VENTRICLE PLAX 2D LVIDd:         6.00 cm      Diastology LVIDs:         4.10 cm      LV e' medial:    9.79 cm/s LV PW:         1.00 cm      LV E/e' medial:  10.0 LV IVS:        0.80 cm      LV e' lateral:    11.40 cm/s LVOT diam:     1.90 cm  LV E/e' lateral: 8.6 LV SV:         62 LV SV Index:   33 LVOT Area:     2.84 cm  LV Volumes (MOD) LV vol d, MOD A2C: 122.0 ml LV vol d, MOD A4C: 190.0 ml LV vol s, MOD A2C: 50.0 ml LV vol s, MOD A4C: 92.7 ml LV SV MOD A2C:     72.0 ml LV SV MOD A4C:     190.0 ml LV SV MOD BP:      87.7 ml RIGHT VENTRICLE             IVC RV Basal diam:  4.00 cm     IVC diam: 1.20 cm RV Mid diam:    3.40 cm RV S prime:     11.00 cm/s TAPSE (M-mode): 1.8 cm LEFT ATRIUM              Index        RIGHT ATRIUM           Index LA diam:        5.20 cm  2.82 cm/m   RA Area:     13.60 cm LA Vol (A2C):   70.8 ml  38.35 ml/m  RA Volume:   33.40 ml  18.09 ml/m LA Vol (A4C):   102.0 ml 55.25 ml/m LA Biplane Vol: 86.4 ml  46.80 ml/m  AORTIC VALVE                    PULMONIC VALVE AV Area (Vmax):    1.67 cm     PV Vmax:       1.12 m/s AV Area (Vmean):   1.43 cm     PV Peak grad:  5.0 mmHg AV Area (VTI):     1.95 cm AV Vmax:           143.00 cm/s AV Vmean:          94.900 cm/s AV VTI:            0.315 m AV Peak Grad:      8.2 mmHg AV Mean Grad:      4.0 mmHg LVOT Vmax:         84.20 cm/s LVOT Vmean:        48.000 cm/s LVOT VTI:          0.217 m LVOT/AV VTI ratio: 0.69  AORTA Ao Root diam: 3.00 cm Ao Asc diam:  3.30 cm MITRAL VALVE               TRICUSPID VALVE MV Area (PHT): 4.31 cm    TR Peak grad:   39.7 mmHg MV Area VTI:   1.69 cm    TR Vmax:        315.00 cm/s MV Peak grad:  6.4 mmHg MV Mean grad:  2.0 mmHg    SHUNTS MV Vmax:       1.26 m/s    Systemic VTI:  0.22 m MV Vmean:      67.7 cm/s   Systemic Diam: 1.90 cm MV Decel Time: 176 msec MV E velocity: 97.90 cm/s MV A velocity: 79.30 cm/s MV E/A ratio:  1.23 Riley Lam MD Electronically signed by Riley Lam MD Signature Date/Time: 06/20/2023/3:27:17 PM    Final       Subjective: Patient seen and examined at bedside.  No fever, vomiting, black or bloody stools reported.  Discharge Exam: Vitals:   06/20/23 2251  06/21/23 0310  BP: 138/61 (!) 151/78  Pulse: 60 (!) 58  Resp: 14 15  Temp: 98.7 F (37.1 C) 97.8 F (36.6 C)  SpO2: 96% 95%    General: Pt is alert, awake, not in acute distress.  Chronically ill and deconditioned.  On room air. Cardiovascular: Mild intermittent bradycardia present, S1/S2 + Respiratory: bilateral decreased breath sounds at bases Abdominal: Soft, obese, NT, ND, bowel sounds + Extremities: Trace lower extremity edema; no cyanosis    The results of significant diagnostics from this hospitalization (including imaging, microbiology, ancillary and laboratory) are listed below for reference.     Microbiology: No results found for this or any previous visit (from the past 240 hours).   Labs: BNP (last 3 results) No results for input(s): "BNP" in the last 8760 hours. Basic Metabolic Panel: Recent Labs  Lab 06/20/23 0439 06/21/23 0401  NA 140 137  K 3.4* 3.5  CL 107 107  CO2 25 24  GLUCOSE 87 87  BUN 29* 29*  CREATININE 0.73 1.10*  CALCIUM 8.4* 8.0*   Liver Function Tests: Recent Labs  Lab 06/20/23 1351  AST 52*  ALT 43  ALKPHOS 116  BILITOT 0.7  PROT 5.1*  ALBUMIN 2.7*   No results for input(s): "LIPASE", "AMYLASE" in the last 168 hours. No results for input(s): "AMMONIA" in the last 168 hours. CBC: Recent Labs  Lab 06/20/23 0439 06/20/23 1402 06/20/23 2105 06/21/23 0401  WBC 4.9  --   --  4.0  NEUTROABS 3.6  --   --   --   HGB 5.9* 7.8* 8.6* 8.0*  HCT 20.4* 25.4* 28.5* 27.0*  MCV 104.6*  --   --  103.4*  PLT 100*  --   --  98*   Cardiac Enzymes: No results for input(s): "CKTOTAL", "CKMB", "CKMBINDEX", "TROPONINI" in the last 168 hours. BNP: Invalid input(s): "POCBNP" CBG: No results for input(s): "GLUCAP" in the last 168 hours. D-Dimer No results for input(s): "DDIMER" in the last 72 hours. Hgb A1c No results for input(s): "HGBA1C" in the last 72 hours. Lipid Profile No results for input(s): "CHOL", "HDL", "LDLCALC", "TRIG",  "CHOLHDL", "LDLDIRECT" in the last 72 hours. Thyroid function studies Recent Labs    06/20/23 1351  TSH 11.209*   Anemia work up No results for input(s): "VITAMINB12", "FOLATE", "FERRITIN", "TIBC", "IRON", "RETICCTPCT" in the last 72 hours. Urinalysis    Component Value Date/Time   COLORURINE YELLOW 05/11/2023 1721   APPEARANCEUR CLOUDY (A) 05/11/2023 1721   LABSPEC 1.012 05/11/2023 1721   PHURINE 7.0 05/11/2023 1721   GLUCOSEU NEGATIVE 05/11/2023 1721   HGBUR LARGE (A) 05/11/2023 1721   BILIRUBINUR NEGATIVE 05/11/2023 1721   KETONESUR NEGATIVE 05/11/2023 1721   PROTEINUR NEGATIVE 05/11/2023 1721   NITRITE POSITIVE (A) 05/11/2023 1721   LEUKOCYTESUR MODERATE (A) 05/11/2023 1721   Sepsis Labs Recent Labs  Lab 06/20/23 0439 06/21/23 0401  WBC 4.9 4.0   Microbiology No results found for this or any previous visit (from the past 240 hours).   Time coordinating discharge: 35 minutes  SIGNED:   Glade Lloyd, MD  [facility] 06/21/2023, 9:46 AM

## 2023-06-21 NOTE — Care Management Obs Status (Signed)
MEDICARE OBSERVATION STATUS NOTIFICATION   Patient Details  Name: TRANAE EICKMEYER MRN: [id number] Date of Birth: [birth year]   Medicare Observation Status Notification Given:  Yes    Larrie Kass, LCSW 06/21/2023, 9:49 AM

## 2023-06-21 NOTE — TOC Transition Note (Signed)
Transition of Care [facility]) - Discharge Note   Patient Details  Name: Peggy Armstrong MRN: [id number] Date of Birth: 09-05-1945  [facility] of [facility]) CM/SW Contact:  Larrie Kass, LCSW Phone Number: 06/21/2023, 10:30 AM   Clinical Narrative:    CSW met with pt to discuss [place]. Pt reports wanting to return to [place] rehab.   CSW spoke to Lauren with Admission, she stated pt would not need a new insurance authorization. Pt to d./c to [facility], room 215, RN to call report [phone].   CSW spoke with pt's daughter [name], she agrees with plan.  [name] is requesting a copy of the D/C summary. PTAR called no further TOC needs TOC sign off.    Final next level of care: Skilled Nursing Facility Barriers to Discharge: No Barriers Identified   Patient Goals and CMS Choice Patient states their goals for this hospitalization and ongoing recovery are:: retrun to [facility]          Discharge Placement                  Name of family member notified: Pipper, [place] (Daughter)  [phone] (Mobile) Patient and family notified of of transfer: 06/21/23  Discharge Plan and Services Additional resources added to the After Visit Summary for                                       Social Drivers of Health (SDOH) Interventions SDOH Screenings   Food Insecurity: No Food Insecurity (06/20/2023)  Housing: Low Risk  (06/20/2023)  Transportation Needs: No Transportation Needs (06/20/2023)  Utilities: Not At Risk (06/20/2023)  Financial Resource Strain: Low Risk  (12/02/2022)   Received from [facility], [facility]  Physical Activity: Unknown (04/05/2022)   Received from [facility], [facility]  Social Connections: Unknown (04/06/2023)   Received from [facility]  Stress: No Stress Concern Present (04/05/2022)   Received from [facility], [facility]  Tobacco Use: Low Risk  (06/20/2023)  Recent Concern: Tobacco Use - Medium Risk  (05/04/2023)   Received from [facility]     Readmission Risk Interventions    05/14/2023    6:44 PM  Readmission Risk Prevention Plan  Transportation Screening Complete  PCP or Specialist Appt within 3-5 Days Complete  HRI or Home Care Consult Complete  Social Work Consult for Recovery Care Planning/Counseling Complete  Palliative Care Screening Complete  Medication Review [facility]) Referral to Pharmacy

## 2023-06-22 ENCOUNTER — Telehealth: Payer: Self-pay | Admitting: Internal Medicine

## 2023-06-22 ENCOUNTER — Other Ambulatory Visit: Payer: Self-pay

## 2023-06-22 ENCOUNTER — Telehealth: Payer: Self-pay

## 2023-06-22 MED ORDER — AMIODARONE HCL 200 MG PO TABS: 200.0000 mg | ORAL_TABLET | Freq: Every day | ORAL | 2 refills | Status: DC

## 2023-06-22 NOTE — Telephone Encounter (Signed)
Consult note with cardiac exam and recommendations faxed to number requested. Will remove from preop pool.

## 2023-06-22 NOTE — Telephone Encounter (Signed)
Seen in hospital 05/2023 stable on Amiodarone. Will provide 30 day Rx with 2 refills. Recommend scheduling cardiology follow up within next 90 days for further refills. Staff message sent to [facility] scheduling team.

## 2023-06-22 NOTE — Telephone Encounter (Signed)
  Beth from [facility] called to follow up on a clearance request received from [facility] on 06/02/23. The patient was recently hospitalized, consulted by Cyndi Bender NP, and cleared for the procedure, with the attestation signed by Dr. Carolan Clines. However, per [place], [facility] still requires the clearance to be faxed to them. The fax number is [phone]

## 2023-06-22 NOTE — Telephone Encounter (Signed)
-----   Message from Mike Gip sent at 06/20/2023 11:06 AM EST ----- Regarding: appt [facility]/ Dr Richardean Canal, you can give this to Dr. Lanetta Inch nurse if more appropriate, this patient should already have had a referral placed to Dr. Maryln Manuel, for outpatient resection of a duodenal adenoma.  She is now back in the hospital with profound anemia requiring transfusion secondary to oozing from this adenoma in setting of chronic Eliquis  She needed cardiac clearance which is being done while she is in the hospital, we requested cardiology consultation.  Apparently that has been the hold-up and that presents according to the family she does not even have the procedure scheduled at this point  Please reach out to [facility] advanced endoscopy/Dr. Raylene Miyamoto on Thursday, to get her a procedure appointment as soon as possible.  Patient is currently in an assisted living facility in [place] undergoing therapy after a lumbar fracture.  He should be able to fax notes from this hospitalization including the cardiac consultation and clearance from them which should suffice -thank you

## 2023-06-22 NOTE — Telephone Encounter (Signed)
Spoke with [facility] clinic and the procedure team. The original referral from [facility] was received. The [facility] GI team has been in touch with the patient. The EGD/EUS date will be scheduled as soon as cardiology clearance and oncology clearance is provided. The hospital consult will not work per their policy.  Called [facility] on [address]. Asked if they would review the consult made during hospitalization and respond to [facility].

## 2023-06-22 NOTE — Telephone Encounter (Signed)
Will forward this to our pre op APP to review notes from today and if anything further is needed.

## 2023-07-10 ENCOUNTER — Telehealth: Payer: Self-pay | Admitting: Cardiovascular Disease

## 2023-07-10 ENCOUNTER — Encounter: Payer: Self-pay | Admitting: Cardiovascular Disease

## 2023-07-10 NOTE — Telephone Encounter (Signed)
 Patient contacted 3x to schedule 2-3 month cardiology f/u. Unable to schedule appt so will send letter to have patient contact office

## 2023-07-10 NOTE — Telephone Encounter (Signed)
-----   Message from Reche GORMAN Finder sent at 06/22/2023  4:42 PM EST ----- Regarding: Overdue follow up Hi team,  Miss Mousseau was recently discharged. Can we please schedule her a f/u in 2-3 mos for cardiology visit? She is technically Dr. Francyne patient but could also see an APP at [facility] or myself (she's met me once before). Could also schedule with Delon Hoover, NP in [facility] if easier for the patient.   TY! Caitlin S Walker, NP

## 2023-07-18 ENCOUNTER — Ambulatory Visit (INDEPENDENT_AMBULATORY_CARE_PROVIDER_SITE_OTHER): Payer: 59 | Admitting: Student

## 2023-07-18 ENCOUNTER — Encounter (HOSPITAL_BASED_OUTPATIENT_CLINIC_OR_DEPARTMENT_OTHER): Payer: Self-pay | Admitting: Student

## 2023-07-18 VITALS — BP 129/84 | HR 78 | Temp 97.5°F | Ht 60.0 in | Wt 190.0 lb

## 2023-07-18 DIAGNOSIS — F411 Generalized anxiety disorder: Secondary | ICD-10-CM | POA: Diagnosis not present

## 2023-07-18 DIAGNOSIS — E785 Hyperlipidemia, unspecified: Secondary | ICD-10-CM

## 2023-07-18 DIAGNOSIS — I5032 Chronic diastolic (congestive) heart failure: Secondary | ICD-10-CM | POA: Diagnosis not present

## 2023-07-18 DIAGNOSIS — I10 Essential (primary) hypertension: Secondary | ICD-10-CM | POA: Diagnosis not present

## 2023-07-18 DIAGNOSIS — E039 Hypothyroidism, unspecified: Secondary | ICD-10-CM

## 2023-07-18 DIAGNOSIS — Z131 Encounter for screening for diabetes mellitus: Secondary | ICD-10-CM | POA: Diagnosis not present

## 2023-07-18 DIAGNOSIS — Z7689 Persons encountering health services in other specified circumstances: Secondary | ICD-10-CM

## 2023-07-18 DIAGNOSIS — K219 Gastro-esophageal reflux disease without esophagitis: Secondary | ICD-10-CM

## 2023-07-18 DIAGNOSIS — G8929 Other chronic pain: Secondary | ICD-10-CM

## 2023-07-18 DIAGNOSIS — D500 Iron deficiency anemia secondary to blood loss (chronic): Secondary | ICD-10-CM

## 2023-07-18 DIAGNOSIS — M1A9XX0 Chronic gout, unspecified, without tophus (tophi): Secondary | ICD-10-CM | POA: Diagnosis not present

## 2023-07-18 DIAGNOSIS — M545 Low back pain, unspecified: Secondary | ICD-10-CM

## 2023-07-18 HISTORY — DX: Low back pain, unspecified: G89.29

## 2023-07-18 HISTORY — DX: Low back pain, unspecified: M54.50

## 2023-07-18 MED ORDER — TIZANIDINE HCL 4 MG PO TABS: 4.0000 mg | ORAL_TABLET | Freq: Four times a day (QID) | ORAL | 2 refills | Status: DC | PRN

## 2023-07-18 MED ORDER — ALLOPURINOL 300 MG PO TABS
300.0000 mg | ORAL_TABLET | Freq: Every day | ORAL | 3 refills | Status: DC
  Filled 2024-04-06 – 2024-06-14 (×3): qty 90, 90d supply, fill #0

## 2023-07-18 MED ORDER — LEVOTHYROXINE SODIUM 100 MCG PO TABS: 100.0000 ug | ORAL_TABLET | Freq: Every day | ORAL | 5 refills | Status: DC

## 2023-07-18 MED ORDER — PANTOPRAZOLE SODIUM 40 MG PO TBEC: 40.0000 mg | DELAYED_RELEASE_TABLET | Freq: Every day | ORAL | 5 refills | Status: DC

## 2023-07-18 MED ORDER — BUSPIRONE HCL 10 MG PO TABS: 10.0000 mg | ORAL_TABLET | Freq: Two times a day (BID) | ORAL | 4 refills | Status: DC

## 2023-07-18 MED ORDER — SERTRALINE HCL 25 MG PO TABS: 25.0000 mg | ORAL_TABLET | Freq: Every day | ORAL | 5 refills | Status: DC

## 2023-07-18 MED ORDER — FUROSEMIDE 20 MG PO TABS: 20.0000 mg | ORAL_TABLET | Freq: Every day | ORAL | 4 refills | Status: DC

## 2023-07-18 NOTE — Assessment & Plan Note (Signed)
Continue to follow with cardiology. Continue atorvastatin

## 2023-07-18 NOTE — Assessment & Plan Note (Signed)
Refill Protonix, no issues noted.

## 2023-07-18 NOTE — Assessment & Plan Note (Signed)
Discussed taking thyroid medication away from food and all other medications, preferably in the mornings.  Will assess TSH today last values were now within normal limits.

## 2023-07-18 NOTE — Assessment & Plan Note (Signed)
Refill antianxiety medications, no issues noted, stable.

## 2023-07-18 NOTE — Assessment & Plan Note (Signed)
No issues noted on therapy. Refill allopurinol 300 mg once daily for gout prophylaxis.  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

## 2023-07-18 NOTE — Assessment & Plan Note (Signed)
Secondary to multiple recent fractures, patient is currently back brace and wheelchair.  Trying to get PT, OT, and home health back at her house to help her.  Currently utilizing Tylenol and muscle relaxers as needed.

## 2023-07-18 NOTE — Progress Notes (Signed)
New Patient Office Visit  Subjective    Patient ID: Peggy Armstrong, female    DOB: 08-19-1945  Age: 78 y.o. MRN: [id number]  CC:  Chief Complaint  Patient presents with   Establish Care    Here toe establish care. Has cancer in stomach. Had surgery 07/11/2023. Has 5 fractures on back. Was having pain on right leg and thought it was arthritis. Doctors did not know why it happened. Had not had any falls since over 1 year. Was told in 04/2023 that she had unexplained fractures. Would like hemoglobin checked.   HPI Peggy Armstrong presents to establish care. Prior PCP was Dr. Kathryne Gin. Last physical was Last year. she notes that she requires refills of all of her medications since her hospitalization.   Hypertension- Patient notes that she takes a daily fluid pill (furosemide). History of CHF and pleural effusion noted on prior imaging. Patient notes that shortness of breath is better on furosemide. Was being supplemented with klorcon as well.  Hypothyroid- Patient has been taking thyroid medication with all other medications. Educated on proper technique. No noted issues on medication or changes noted to hair, skin, or nails.  Multiple fracture history- 04/2023 had multiple fractures of the spine as below. These fractures were deemed to have an unknown cause. Currently taking tylenol as needed and muscle relaxers. Patient reports that pain is well controlled on this regimen. Patient presents today wearing back brace. No prior DEXA scans noted per chart review. Will discuss DEXA scan at next visit.  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 Originally presented with major abdominal pain, admitted with cholangitis when mass was found. No current issues noted.  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 EGD was done for blood loss anemia in Hgb. Patient is to continue to  follow up outpatient with Dr. Corliss Parish at [facility]. Plan for follow up EGD for complete resection per patho report.  Anemia- Patient has long term history of IDA ruled to be due to lack of dietary iron intake. More recently this was thought to be due to Duodenal mass (as above). Mass has been removed (07/10/22) pathology showed no infiltrating mets, about 50-60% was resected.  HGB/HCT- 12/25 8.0, 12/24- 8.6, 12/24 5.9, 11/21 8.0 11/20 8.3. All with MCV >100.   ADLs- Can get out of bed on her own. Therapy was coming to her house. Home health, occupational, and physical therapy- patient reports that she needs an order signed for this. Has been doing exercises on her own. It is my opinion that the patient would benefit from further Physical Therapy.  Follows with cardiology for Diastolic CHF (EF 57-84), hyperlipidemia (stable on atorvastatin- no issues noted), and Afib (anticoagulated on eliquis). Has been restarted on eliquis as per EGD note (at least 4 days after procedure).  Screenings:  Colon Cancer: denies colonoscopy. Lung Cancer: not indicated Breast Cancer: not indicated Diabetes: Indicated. HLD: Indicated- cannot find last lipid panel. On atorvastatin- no issues noted.  Pertinent imaging: 07/11/23 EGD- 50mm multilobulated duodenal polyp.  Pathology: Intestinal-type adenoma (multiple fragments), with multifocal high grade dysplasia. No invasive carcinoma identified. 05/13/23 MR Lumbar Spine-1. Subacute burst type compression fracture involving the L2 vertebral body with near complete central height loss and 6 mm bony retropulsion. Resultant moderate spinal stenosis at this level. No underlying pathologic lesion identified. 2. Acute insufficiency type fractures involving the bilateral sacral ala. 3.  Subacute healing fractures of the right posterior tenth, eleventh, and twelfth ribs. 4. Broad-based left subarticular disc protrusion at L5-S1, closely approximating and potentially irritating the  descending left S1 nerve root. 5. Mild to moderate multilevel foraminal due to disc bulging and facet disease as above. 05/11/23 CT Femur Left- 1. A 3.3 cm lipomatous lesion that demonstrates calcification and slight complexity. Recommend attention on follow-up. 2.  Negative for acute traumatic injury. 05/11/23 CT L Spine- 1. Interval development of an age-indeterminate complete burst fracture of the L2 vertebral body with a sclerotic appearance of the remaining vertebra with up to 99% vertebral body height loss and 6 mm retropulsion into the central canal. Correlate with point tenderness to palpation to evaluate for an acute component. Pathologic fracture is not excluded given sclerosis of the majority of the vertebra. 2. 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 Recommend follow-up ultrasound every 3 years.  Additional imaging under chart review.  Outpatient Encounter Medications as of 07/18/2023  Medication Sig   acetaminophen (TYLENOL) 325 MG tablet Take 650 mg by mouth every 6 (six) hours as needed for mild pain (pain score 1-3) or moderate pain (pain score 4-6).   amiodarone (PACERONE) 200 MG tablet Take 1 tablet (200 mg total) by mouth daily. Please call [phone] to schedule cardiology follow up for further refills.   Ascorbic Acid (VITAMIN C CR) 500 MG TBCR Take 500 mg by mouth daily.   atorvastatin (LIPITOR) 20 MG tablet Take 1 tablet (20 mg total) by mouth daily.   cholecalciferol (VITAMIN D) 1000 units tablet Take 1,000 Units by mouth daily.   cyanocobalamin (VITAMIN B12) 1000 MCG tablet Take 1 tablet by mouth daily.   ELIQUIS 5 MG TABS tablet Take 5 mg by mouth 2 (two) times daily.   ferrous sulfate 325 (65 FE) MG tablet Take 325 mg by mouth daily with breakfast.   folic acid (FOLVITE) 1 MG tablet Take 1 tablet (1 mg total) by mouth daily.   KLOR-CON M20 20 MEQ tablet Take 20 mEq by mouth daily.   melatonin 3 MG TABS tablet Take 1 tablet (3 mg total) by  mouth at bedtime as needed (insomnia).   ondansetron (ZOFRAN) 4 MG tablet Take 4 mg by mouth every 6 (six) hours as needed for nausea or vomiting.   oxyCODONE (OXY IR/ROXICODONE) 5 MG immediate release tablet Take 5 mg by mouth as needed for severe pain (pain score 7-10).   tiZANidine (ZANAFLEX) 4 MG tablet Take 1 tablet (4 mg total) by mouth every 6 (six) hours as needed for muscle spasms.   [DISCONTINUED] allopurinol (ZYLOPRIM) 100 MG tablet Take 200 mg by mouth daily.   [DISCONTINUED] busPIRone (BUSPAR) 10 MG tablet Take 1 tablet by mouth 2 (two) times daily.   [DISCONTINUED] ferrous sulfate 325 (65 FE) MG tablet Take 1 tablet by mouth daily.   [DISCONTINUED] furosemide (LASIX) 20 MG tablet Take 20 mg by mouth daily.   [DISCONTINUED] levothyroxine (SYNTHROID) 100 MCG tablet Take 1 tablet by mouth daily.   [DISCONTINUED] methocarbamol (ROBAXIN) 500 MG tablet Take 1 tablet (500 mg total) by mouth every 6 (six) hours as needed for muscle spasms.   [DISCONTINUED] pantoprazole (PROTONIX) 40 MG tablet Take 1 tablet (40 mg total) by mouth 2 (two) times daily before a meal.   [DISCONTINUED] pantoprazole (PROTONIX) 40 MG tablet Take 1 tablet by mouth daily.   [DISCONTINUED] potassium chloride SA (KLOR-CON M) 20 MEQ tablet Take 40 mEq by mouth daily.   [  DISCONTINUED] sertraline (ZOLOFT) 25 MG tablet Take 25 mg by mouth daily.    allopurinol (ZYLOPRIM) 300 MG tablet Take 1 tablet (300 mg total) by mouth daily.   busPIRone (BUSPAR) 10 MG tablet Take 1 tablet (10 mg total) by mouth 2 (two) times daily.   furosemide (LASIX) 20 MG tablet Take 1 tablet (20 mg total) by mouth daily.   levothyroxine (SYNTHROID) 100 MCG tablet Take 1 tablet (100 mcg total) by mouth daily before breakfast.   pantoprazole (PROTONIX) 40 MG tablet Take 1 tablet (40 mg total) by mouth daily.   sertraline (ZOLOFT) 25 MG tablet Take 1 tablet (25 mg total) by mouth daily.   [DISCONTINUED] allopurinol (ZYLOPRIM) 300 MG tablet Take 300 mg  by mouth daily.  (Patient not taking: Reported on 07/18/2023)   [DISCONTINUED] amiodarone (PACERONE) 200 MG tablet Take 1 tablet by mouth daily. (Patient not taking: Reported on 07/18/2023)   [DISCONTINUED] Ascorbic Acid 500 MG TBCR Take 500 mg by mouth daily. (Patient not taking: Reported on 07/18/2023)   [DISCONTINUED] atorvastatin (LIPITOR) 20 MG tablet Take 1 tablet by mouth daily.   [DISCONTINUED] busPIRone (BUSPAR) 10 MG tablet Take by mouth 2 (two) times daily.   [DISCONTINUED] calcium carbonate (OS-CAL - DOSED IN MG OF ELEMENTAL CALCIUM) 1250 (500 Ca) MG tablet Take 1 tablet by mouth daily. (Patient not taking: Reported on 07/18/2023)   [DISCONTINUED] diphenhydrAMINE (BENADRYL) 25 mg capsule Take 25 mg by mouth every 4 (four) hours as needed for itching. (Patient not taking: Reported on 07/18/2023)   [DISCONTINUED] feeding supplement (ENSURE ENLIVE / ENSURE PLUS) LIQD Take 237 mLs by mouth 3 (three) times daily between meals.   [DISCONTINUED] folic acid (FOLVITE) 1 MG tablet Take 1 tablet by mouth daily.   [DISCONTINUED] furosemide (LASIX) 20 MG tablet Take 20 mg by mouth daily.   [DISCONTINUED] levothyroxine (SYNTHROID) 88 MCG tablet Take 1 tablet (88 mcg total) by mouth daily at 6 (six) AM. (Patient taking differently: Take 100 mcg by mouth daily at 6 (six) AM.)   [DISCONTINUED] lidocaine (LIDODERM) 5 % Place 2 patches onto the skin daily. Remove & Discard patch within 12 hours or as directed by MD   [DISCONTINUED] predniSONE (DELTASONE) 10 MG tablet Take 10 mg by mouth daily.   [DISCONTINUED] senna-docusate (SENOKOT-S) 8.6-50 MG tablet Take 1 tablet by mouth 2 (two) times daily.   [DISCONTINUED] sertraline (ZOLOFT) 25 MG tablet Take 1 tablet by mouth daily.   [DISCONTINUED] vitamin B-12 (CYANOCOBALAMIN) 1000 MCG tablet Take 1,000 mcg by mouth daily.   No facility-administered encounter medications on file as of 07/18/2023.    Past Medical History:  Diagnosis Date   Acquired hypothyroidism     Atrial fibrillation (HCC)    Back injury    DVT (deep venous thrombosis) (HCC)    right DVT   GAD (generalized anxiety disorder)    GERD (gastroesophageal reflux disease)    Gout    Gout    Hypertension    RA (rheumatoid arthritis) (HCC)     Past Surgical History:  Procedure Laterality Date   ABDOMINAL HYSTERECTOMY     BIOPSY  05/16/2023   Procedure: BIOPSY;  Surgeon: Meryl Dare, MD;  Location: [facility];  Service: Gastroenterology;;   CHOLECYSTECTOMY     ENDOSCOPIC RETROGRADE CHOLANGIOPANCREATOGRAPHY (ERCP) WITH PROPOFOL N/A 06/15/2017   Procedure: ENDOSCOPIC RETROGRADE CHOLANGIOPANCREATOGRAPHY (ERCP) WITH PROPOFOL;  Surgeon: Rachael Fee, MD;  Location: [facility];  Service: Endoscopy;  Laterality: N/A;   ESOPHAGOGASTRODUODENOSCOPY (EGD) WITH PROPOFOL N/A  03/24/2017   Procedure: ESOPHAGOGASTRODUODENOSCOPY (EGD) WITH PROPOFOL;  Surgeon: Sherrilyn Rist, MD;  Location: [facility];  Service: Gastroenterology;  Laterality: N/A;   ESOPHAGOGASTRODUODENOSCOPY (EGD) WITH PROPOFOL N/A 11/07/2017   Procedure: ESOPHAGOGASTRODUODENOSCOPY (EGD) WITH PROPOFOL;  Surgeon: Rachael Fee, MD;  Location: [facility];  Service: Endoscopy;  Laterality: N/A;   ESOPHAGOGASTRODUODENOSCOPY (EGD) WITH PROPOFOL N/A 05/16/2023   Procedure: ESOPHAGOGASTRODUODENOSCOPY (EGD) WITH PROPOFOL;  Surgeon: Meryl Dare, MD;  Location: [facility];  Service: Gastroenterology;  Laterality: N/A;   EUS N/A 04/06/2017   Procedure: UPPER ENDOSCOPIC ULTRASOUND (EUS) LINEAR;  Surgeon: Rachael Fee, MD;  Location: [facility];  Service: Endoscopy;  Laterality: N/A;  to evaluate duodenal mass   HERNIA REPAIR     IR BILIARY DRAIN PLACEMENT WITH CHOLANGIOGRAM  03/25/2017   IR CONVERT BILIARY DRAIN TO INT EXT BILIARY DRAIN  04/03/2017   POLYPECTOMY  05/16/2023   Procedure: POLYPECTOMY;  Surgeon: Meryl Dare, MD;  Location: [facility];  Service: Gastroenterology;;    Family History  Problem  Relation Age of Onset   Hypertension Mother    Prostate cancer Father    Colon cancer Father    Hypertension Brother    Rheum arthritis Maternal Grandmother    Diabetes Paternal Grandmother    Heart disease Paternal Grandfather     Social History   Socioeconomic History   Marital status: Divorced    Spouse name: Not on file   Number of children: 2   Years of education: Not on file   Highest education level: Not on file  Occupational History   Not on file  Tobacco Use   Smoking status: Former    Types: Cigarettes    Passive exposure: Past   Smokeless tobacco: Former   Tobacco comments:    Quit in 2012. Started at 20's. Cannot recall how many.   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  Other Topics Concern   Not on file  Social History Narrative   Not on file   Social Drivers of Health   Financial Resource Strain: Low Risk  (12/02/2022)   Received from [facility], [facility]   Overall Financial Resource Strain (CARDIA)    Difficulty of Paying Living Expenses: Not very hard  Food Insecurity: No Food Insecurity (06/20/2023)   Hunger Vital Sign    Worried About Running Out of Food in the Last Year: Never true    Ran Out of Food in the Last Year: Never true  Transportation Needs: No Transportation Needs (06/20/2023)   PRAPARE - Administrator, Civil Service (Medical): No    Lack of Transportation (Non-Medical): No  Physical Activity: Unknown (04/05/2022)   Received from [facility], [facility]   Exercise Vital Sign    Days of Exercise per Week: 0 days    Minutes of Exercise per Session: Not on file  Stress: No Stress Concern Present (04/05/2022)   Received from [facility], [facility] - Occupational Stress Questionnaire    Feeling of Stress : Not at all  Social  Connections: Unknown (04/06/2023)   Received from [facility]   Social Network    Social Network: Not on file  Intimate Partner Violence: Not At Risk (06/20/2023)   Humiliation, Afraid, Rape, and Kick  questionnaire    Fear of Current or Ex-Partner: No    Emotionally Abused: No    Physically Abused: No    Sexually Abused: No    ROS  Per HPI      Objective    BP 129/84 (BP Location: Right Arm, Patient Position: Sitting, Cuff Size: Normal)   Pulse 78   Temp (!) 97.5 F (36.4 C) (Oral)   Ht 5' (1.524 m)   Wt 190 lb (86.2 kg)   SpO2 97%   BMI 37.11 kg/m   Physical Exam Constitutional:      General: She is not in acute distress.    Appearance: Normal appearance. She is not ill-appearing.     Comments: Exam limited by patient in pain in wheelchair with back brace.  HENT:     Head: Normocephalic and atraumatic.     Nose: Nose normal.  Eyes:     Conjunctiva/sclera: Conjunctivae normal.  Neck:     Vascular: No carotid bruit.  Cardiovascular:     Rate and Rhythm: Normal rate and regular rhythm.     Pulses: Normal pulses.     Heart sounds: Normal heart sounds. No murmur heard.    No friction rub.  Pulmonary:     Effort: Pulmonary effort is normal. No respiratory distress.     Breath sounds: Normal breath sounds. No wheezing, rhonchi or rales.  Abdominal:     General: Abdomen is flat.  Musculoskeletal:        General: Normal range of motion.     Cervical back: Neck supple.     Comments: Slight nonpitting pedal edema bilaterally.  Lymphadenopathy:     Cervical: No cervical adenopathy.  Skin:    Coloration: Skin is pale. Skin is not jaundiced.  Neurological:     General: No focal deficit present.     Mental Status: She is alert.  Psychiatric:        Mood and Affect: Mood normal.        Behavior: Behavior normal.         Assessment & Plan:   Encounter to establish care  Chronic gout involving toe without tophus, unspecified cause, unspecified  laterality Assessment & Plan: No issues noted on therapy. Refill allopurinol 300 mg once daily for gout prophylaxis.  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  Orders: -     Allopurinol; Take 1 tablet (300 mg total) by mouth daily.  Dispense: 90 tablet; Refill: 3  Gastroesophageal reflux disease without esophagitis Assessment & Plan: Refill Protonix, no issues noted.  Orders: -     Pantoprazole Sodium; Take 1 tablet (40 mg total) by mouth daily.  Dispense: 30 tablet; Refill: 5  Essential hypertension Assessment & Plan: Will assess baseline labs today.  Will assess potassium as she is continuing furosemide, as it has controlled her shortness of breath.  Will send in Klor-Con if potassium becomes as low.  Orders: -     Furosemide; Take 1 tablet (20 mg total) by mouth daily.  Dispense: 30 tablet; Refill: 4 -     Comprehensive metabolic panel  GAD (generalized anxiety disorder) Assessment & Plan: Refill antianxiety medications, no issues noted, stable.   Orders: -     busPIRone HCl; Take 1 tablet (10 mg total) by mouth 2 (two) times daily.  Dispense: 60 tablet; Refill: 4 -     Sertraline HCl; Take 1 tablet (25 mg total) by mouth daily.  Dispense: 30 tablet; Refill: 5  Acquired hypothyroidism Assessment & Plan:  Discussed taking thyroid medication away from food and all other medications, preferably in the mornings.  Will assess TSH today last values were now within normal limits.  Orders: -     Levothyroxine Sodium; Take 1 tablet (100 mcg total) by mouth daily before breakfast.  Dispense: 30 tablet; Refill: 5 -     TSH  Iron deficiency anemia due to chronic blood loss Assessment & Plan: Deferring to Hematology- Long term anemia ruled IDA with inadequate reticulocyte index (1.1). MCV>100? Recent duodenal mass was removed (about 50-60% removal with plans to complete resection) but pathology showed no signs of infiltrating malignancy. ED recent 11/14 for compression fractures- ruled  unexplained and lipomatous femur lesion noted. Hospitalization recent 12/24 for duodenal mass, EGD done and mass removed as above, no colonoscopy done.  Patient was seeing hematology with [facility] but would like to switch to the Cancer Center at [facility]- will send in referral. I appreciate their help in this workup.  Orders: -     CBC with Differential/Platelet -     Haptoglobin -     Reticulocytes  Chronic diastolic CHF (congestive heart failure) (HCC) Assessment & Plan: Continue to follow with cardiology.  Suspect that transfusion protocol should be at 8 in light of heart failure.   Hyperlipidemia, unspecified hyperlipidemia type Assessment & Plan: Continue to follow with cardiology. Continue atorvastatin  Orders: -     Lipid panel  Screening for diabetes mellitus -     Hemoglobin A1c  Acute low back pain, unspecified back pain laterality, unspecified whether sciatica present Assessment & Plan: Secondary to multiple recent fractures, patient is currently back brace and wheelchair.  Trying to get PT, OT, and home health back at her house to help her.  Currently utilizing Tylenol and muscle relaxers as needed.  Orders: -     tiZANidine HCl; Take 1 tablet (4 mg total) by mouth every 6 (six) hours as needed for muscle spasms.  Dispense: 60 tablet; Refill: 2   I have spent greater than 60 minutes charting, educating, diagnosing and managing this patient for this visit.  Return in about 4 weeks (around 08/15/2023) for Chronic Followup.   Teryl Lucy Luisdaniel Kenton, PA-C

## 2023-07-18 NOTE — Patient Instructions (Addendum)
It was nice to see you today!  As we discussed in clinic I will refer you to the hematologist here to check out your anemia.  I will send all of your prescriptions in.  If you have any problems before your next visit feel free to message me via MyChart (minor issues or questions) or call the office, otherwise you may reach out to schedule an office visit.  Thank you! Gerilyn Pilgrim Tierria Watson, PA-C

## 2023-07-18 NOTE — Assessment & Plan Note (Addendum)
Deferring to Hematology- Long term anemia ruled IDA with inadequate reticulocyte index (1.1). MCV>100? Recent duodenal mass was removed (about 50-60% removal with plans to complete resection) but pathology showed no signs of infiltrating malignancy. ED recent 11/14 for compression fractures- ruled unexplained and lipomatous femur lesion noted. Hospitalization recent 12/24 for duodenal mass, EGD done and mass removed as above, no colonoscopy done.  Patient was seeing hematology with [facility] but would like to switch to the Cancer Center at [facility]- will send in referral. I appreciate their help in this workup.

## 2023-07-18 NOTE — Assessment & Plan Note (Signed)
" >>  ASSESSMENT AND PLAN FOR HYPOTHYROIDISM WRITTEN ON 07/18/2023  4:52 PM BY Eliasar Hlavaty T, PA-C  Discussed taking thyroid  medication away from food and all other medications, preferably in the mornings.  Will assess TSH today last values were now within normal limits. "

## 2023-07-18 NOTE — Assessment & Plan Note (Addendum)
Continue to follow with cardiology.  Suspect that transfusion protocol should be at 8 in light of heart failure.

## 2023-07-18 NOTE — Assessment & Plan Note (Addendum)
Will assess baseline labs today.  Will assess potassium as she is continuing furosemide, as it has controlled her shortness of breath.  Will send in Klor-Con if potassium becomes as low.

## 2023-07-19 LAB — CBC WITH DIFFERENTIAL/PLATELET
Basophils Absolute: 0 10*3/uL (ref 0.0–0.2)
Basos: 1 %
EOS (ABSOLUTE): 0.1 10*3/uL (ref 0.0–0.4)
Eos: 3 %
Hematocrit: 34.2 % (ref 34.0–46.6)
Hemoglobin: 10.4 g/dL — ABNORMAL LOW (ref 11.1–15.9)
Immature Grans (Abs): 0 10*3/uL (ref 0.0–0.1)
Immature Granulocytes: 1 %
Lymphocytes Absolute: 0.5 10*3/uL — ABNORMAL LOW (ref 0.7–3.1)
Lymphs: 11 %
MCH: 29.5 pg (ref 26.6–33.0)
MCHC: 30.4 g/dL — ABNORMAL LOW (ref 31.5–35.7)
MCV: 97 fL (ref 79–97)
Monocytes Absolute: 0.4 10*3/uL (ref 0.1–0.9)
Monocytes: 8 %
Neutrophils Absolute: 3.8 10*3/uL (ref 1.4–7.0)
Neutrophils: 76 %
Platelets: 116 10*3/uL — ABNORMAL LOW (ref 150–450)
RBC: 3.53 x10E6/uL — ABNORMAL LOW (ref 3.77–5.28)
RDW: 13.5 % (ref 11.7–15.4)
WBC: 4.9 10*3/uL (ref 3.4–10.8)

## 2023-07-19 LAB — COMPREHENSIVE METABOLIC PANEL
ALT: 30 [IU]/L (ref 0–32)
AST: 48 [IU]/L — ABNORMAL HIGH (ref 0–40)
Albumin: 3.3 g/dL — ABNORMAL LOW (ref 3.8–4.8)
Alkaline Phosphatase: 160 [IU]/L — ABNORMAL HIGH (ref 44–121)
BUN/Creatinine Ratio: 19 (ref 12–28)
BUN: 18 mg/dL (ref 8–27)
Bilirubin Total: 0.3 mg/dL (ref 0.0–1.2)
CO2: 23 mmol/L (ref 20–29)
Calcium: 8.5 mg/dL — ABNORMAL LOW (ref 8.7–10.3)
Chloride: 102 mmol/L (ref 96–106)
Creatinine, Ser: 0.93 mg/dL (ref 0.57–1.00)
Globulin, Total: 2.4 g/dL (ref 1.5–4.5)
Glucose: 96 mg/dL (ref 70–99)
Potassium: 4.4 mmol/L (ref 3.5–5.2)
Sodium: 141 mmol/L (ref 134–144)
Total Protein: 5.7 g/dL — ABNORMAL LOW (ref 6.0–8.5)
eGFR: 63 mL/min/{1.73_m2} (ref >59)

## 2023-07-19 LAB — LIPID PANEL
Chol/HDL Ratio: 5.1 {ratio} — ABNORMAL HIGH (ref 0.0–4.4)
Cholesterol, Total: 127 mg/dL (ref 100–199)
HDL: 25 mg/dL — ABNORMAL LOW (ref >39)
LDL Chol Calc (NIH): 80 mg/dL (ref 0–99)
Triglycerides: 122 mg/dL (ref 0–149)
VLDL Cholesterol Cal: 22 mg/dL (ref 5–40)

## 2023-07-19 LAB — HAPTOGLOBIN: Haptoglobin: 118 mg/dL (ref 42–346)

## 2023-07-19 LAB — TSH: TSH: 5.23 u[IU]/mL — ABNORMAL HIGH (ref 0.450–4.500)

## 2023-07-19 LAB — HEMOGLOBIN A1C
Est. average glucose Bld gHb Est-mCnc: 77 mg/dL
Hgb A1c MFr Bld: 4.3 % — ABNORMAL LOW (ref 4.8–5.6)

## 2023-07-20 ENCOUNTER — Emergency Department (HOSPITAL_COMMUNITY): Payer: 59

## 2023-07-20 ENCOUNTER — Other Ambulatory Visit: Payer: Self-pay

## 2023-07-20 ENCOUNTER — Inpatient Hospital Stay (HOSPITAL_COMMUNITY)
Admission: EM | Admit: 2023-07-20 | Discharge: 2023-07-27 | DRG: 356 | Disposition: A | Discharge status: Home Health | Payer: 59 | Attending: Family Medicine | Admitting: Family Medicine

## 2023-07-20 ENCOUNTER — Encounter (HOSPITAL_COMMUNITY): Payer: Self-pay

## 2023-07-20 DIAGNOSIS — K219 Gastro-esophageal reflux disease without esophagitis: Secondary | ICD-10-CM | POA: Diagnosis present

## 2023-07-20 DIAGNOSIS — Z885 Allergy status to narcotic agent status: Secondary | ICD-10-CM

## 2023-07-20 DIAGNOSIS — K317 Polyp of stomach and duodenum: Secondary | ICD-10-CM | POA: Diagnosis present

## 2023-07-20 DIAGNOSIS — K260 Acute duodenal ulcer with hemorrhage: Secondary | ICD-10-CM | POA: Diagnosis not present

## 2023-07-20 DIAGNOSIS — Z79890 Hormone replacement therapy (postmenopausal): Secondary | ICD-10-CM

## 2023-07-20 DIAGNOSIS — Z8261 Family history of arthritis: Secondary | ICD-10-CM

## 2023-07-20 DIAGNOSIS — E876 Hypokalemia: Secondary | ICD-10-CM | POA: Diagnosis present

## 2023-07-20 DIAGNOSIS — Z8249 Family history of ischemic heart disease and other diseases of the circulatory system: Secondary | ICD-10-CM

## 2023-07-20 DIAGNOSIS — M109 Gout, unspecified: Secondary | ICD-10-CM | POA: Diagnosis present

## 2023-07-20 DIAGNOSIS — K922 Gastrointestinal hemorrhage, unspecified: Principal | ICD-10-CM | POA: Diagnosis present

## 2023-07-20 DIAGNOSIS — N1832 Chronic kidney disease, stage 3b: Secondary | ICD-10-CM | POA: Diagnosis present

## 2023-07-20 DIAGNOSIS — R578 Other shock: Secondary | ICD-10-CM | POA: Diagnosis present

## 2023-07-20 DIAGNOSIS — M069 Rheumatoid arthritis, unspecified: Secondary | ICD-10-CM | POA: Diagnosis present

## 2023-07-20 DIAGNOSIS — N179 Acute kidney failure, unspecified: Secondary | ICD-10-CM | POA: Diagnosis present

## 2023-07-20 DIAGNOSIS — Z7901 Long term (current) use of anticoagulants: Secondary | ICD-10-CM

## 2023-07-20 DIAGNOSIS — R571 Hypovolemic shock: Secondary | ICD-10-CM | POA: Diagnosis present

## 2023-07-20 DIAGNOSIS — G4700 Insomnia, unspecified: Secondary | ICD-10-CM | POA: Diagnosis present

## 2023-07-20 DIAGNOSIS — F411 Generalized anxiety disorder: Secondary | ICD-10-CM | POA: Diagnosis present

## 2023-07-20 DIAGNOSIS — R5381 Other malaise: Secondary | ICD-10-CM | POA: Diagnosis present

## 2023-07-20 DIAGNOSIS — I482 Chronic atrial fibrillation, unspecified: Secondary | ICD-10-CM | POA: Diagnosis present

## 2023-07-20 DIAGNOSIS — Z91048 Other nonmedicinal substance allergy status: Secondary | ICD-10-CM

## 2023-07-20 DIAGNOSIS — E669 Obesity, unspecified: Secondary | ICD-10-CM | POA: Diagnosis present

## 2023-07-20 DIAGNOSIS — K449 Diaphragmatic hernia without obstruction or gangrene: Secondary | ICD-10-CM | POA: Diagnosis present

## 2023-07-20 DIAGNOSIS — Z833 Family history of diabetes mellitus: Secondary | ICD-10-CM

## 2023-07-20 DIAGNOSIS — E871 Hypo-osmolality and hyponatremia: Secondary | ICD-10-CM | POA: Diagnosis present

## 2023-07-20 DIAGNOSIS — Z90710 Acquired absence of both cervix and uterus: Secondary | ICD-10-CM

## 2023-07-20 DIAGNOSIS — Z6836 Body mass index (BMI) 36.0-36.9, adult: Secondary | ICD-10-CM

## 2023-07-20 DIAGNOSIS — I130 Hypertensive heart and chronic kidney disease with heart failure and stage 1 through stage 4 chronic kidney disease, or unspecified chronic kidney disease: Secondary | ICD-10-CM | POA: Diagnosis present

## 2023-07-20 DIAGNOSIS — Z8042 Family history of malignant neoplasm of prostate: Secondary | ICD-10-CM

## 2023-07-20 DIAGNOSIS — D62 Acute posthemorrhagic anemia: Secondary | ICD-10-CM | POA: Diagnosis present

## 2023-07-20 DIAGNOSIS — F32A Depression, unspecified: Secondary | ICD-10-CM | POA: Diagnosis present

## 2023-07-20 DIAGNOSIS — D689 Coagulation defect, unspecified: Secondary | ICD-10-CM | POA: Diagnosis present

## 2023-07-20 DIAGNOSIS — I5032 Chronic diastolic (congestive) heart failure: Secondary | ICD-10-CM | POA: Diagnosis present

## 2023-07-20 DIAGNOSIS — Z87891 Personal history of nicotine dependence: Secondary | ICD-10-CM

## 2023-07-20 DIAGNOSIS — D638 Anemia in other chronic diseases classified elsewhere: Secondary | ICD-10-CM | POA: Diagnosis present

## 2023-07-20 DIAGNOSIS — Z86718 Personal history of other venous thrombosis and embolism: Secondary | ICD-10-CM

## 2023-07-20 DIAGNOSIS — Z79899 Other long term (current) drug therapy: Secondary | ICD-10-CM

## 2023-07-20 DIAGNOSIS — L299 Pruritus, unspecified: Secondary | ICD-10-CM | POA: Diagnosis not present

## 2023-07-20 DIAGNOSIS — K921 Melena: Secondary | ICD-10-CM | POA: Diagnosis not present

## 2023-07-20 DIAGNOSIS — E039 Hypothyroidism, unspecified: Secondary | ICD-10-CM | POA: Diagnosis present

## 2023-07-20 DIAGNOSIS — D509 Iron deficiency anemia, unspecified: Secondary | ICD-10-CM | POA: Diagnosis present

## 2023-07-20 DIAGNOSIS — E785 Hyperlipidemia, unspecified: Secondary | ICD-10-CM | POA: Diagnosis present

## 2023-07-20 DIAGNOSIS — Z800 Family history of malignant neoplasm of digestive organs: Secondary | ICD-10-CM

## 2023-07-20 HISTORY — DX: Gastrointestinal hemorrhage, unspecified: K92.2

## 2023-07-20 LAB — CBC WITH DIFFERENTIAL/PLATELET
Abs Immature Granulocytes: 0.1 10*3/uL — ABNORMAL HIGH (ref 0.00–0.07)
Basophils Absolute: 0 10*3/uL (ref 0.0–0.1)
Basophils Relative: 1 %
Eosinophils Absolute: 0.1 10*3/uL (ref 0.0–0.5)
Eosinophils Relative: 1 %
HCT: 26.5 % — ABNORMAL LOW (ref 36.0–46.0)
Hemoglobin: 7.8 g/dL — ABNORMAL LOW (ref 12.0–15.0)
Immature Granulocytes: 1 %
Lymphocytes Relative: 11 %
Lymphs Abs: 1 10*3/uL (ref 0.7–4.0)
MCH: 29.9 pg (ref 26.0–34.0)
MCHC: 29.4 g/dL — ABNORMAL LOW (ref 30.0–36.0)
MCV: 101.5 fL — ABNORMAL HIGH (ref 80.0–100.0)
Monocytes Absolute: 0.5 10*3/uL (ref 0.1–1.0)
Monocytes Relative: 6 %
Neutro Abs: 7.1 10*3/uL (ref 1.7–7.7)
Neutrophils Relative %: 80 %
Platelets: 150 10*3/uL (ref 150–400)
RBC: 2.61 MIL/uL — ABNORMAL LOW (ref 3.87–5.11)
RDW: 14.9 % (ref 11.5–15.5)
WBC: 8.8 10*3/uL (ref 4.0–10.5)
nRBC: 0 % (ref 0.0–0.2)

## 2023-07-20 LAB — COMPREHENSIVE METABOLIC PANEL
ALT: 26 U/L (ref 0–44)
AST: 42 U/L — ABNORMAL HIGH (ref 15–41)
Albumin: 2.4 g/dL — ABNORMAL LOW (ref 3.5–5.0)
Alkaline Phosphatase: 99 U/L (ref 38–126)
Anion gap: 12 (ref 5–15)
BUN: 26 mg/dL — ABNORMAL HIGH (ref 8–23)
CO2: 23 mmol/L (ref 22–32)
Calcium: 7.8 mg/dL — ABNORMAL LOW (ref 8.9–10.3)
Chloride: 105 mmol/L (ref 98–111)
Creatinine, Ser: 1.09 mg/dL — ABNORMAL HIGH (ref 0.44–1.00)
GFR, Estimated: 52 mL/min — ABNORMAL LOW (ref >60)
Glucose, Bld: 147 mg/dL — ABNORMAL HIGH (ref 70–99)
Potassium: 4.2 mmol/L (ref 3.5–5.1)
Sodium: 140 mmol/L (ref 135–145)
Total Bilirubin: 0.6 mg/dL (ref 0.0–1.2)
Total Protein: 4.7 g/dL — ABNORMAL LOW (ref 6.5–8.1)

## 2023-07-20 LAB — HEMOGLOBIN AND HEMATOCRIT, BLOOD
HCT: 22.7 % — ABNORMAL LOW (ref 36.0–46.0)
HCT: 24.4 % — ABNORMAL LOW (ref 36.0–46.0)
Hemoglobin: 6.8 g/dL — CL (ref 12.0–15.0)
Hemoglobin: 7.8 g/dL — ABNORMAL LOW (ref 12.0–15.0)

## 2023-07-20 LAB — PROTIME-INR
INR: 1.6 — ABNORMAL HIGH (ref 0.8–1.2)
Prothrombin Time: 19.5 s — ABNORMAL HIGH (ref 11.4–15.2)

## 2023-07-20 LAB — PREPARE RBC (CROSSMATCH)

## 2023-07-20 MED ORDER — SODIUM CHLORIDE 0.9 % IV SOLN: INTRAVENOUS | Status: AC

## 2023-07-20 MED ORDER — PROTHROMBIN COMPLEX CONC HUMAN 500 UNITS IV KIT
2196.0000 [IU] | PACK | Status: AC
  Administered 2023-07-20: 2196 [IU] via INTRAVENOUS
  Filled 2023-07-20: qty 2196

## 2023-07-20 MED ORDER — ONDANSETRON HCL 4 MG PO TABS: 4.0000 mg | ORAL_TABLET | Freq: Four times a day (QID) | ORAL | Status: DC | PRN

## 2023-07-20 MED ORDER — PANTOPRAZOLE SODIUM 40 MG IV SOLR
40.0000 mg | INTRAVENOUS | Status: AC
  Administered 2023-07-20 (×2): 40 mg via INTRAVENOUS
  Filled 2023-07-20: qty 10

## 2023-07-20 MED ORDER — IOHEXOL 350 MG/ML SOLN
75.0000 mL | Freq: Once | INTRAVENOUS | Status: AC | PRN
  Administered 2023-07-20: 75 mL via INTRAVENOUS

## 2023-07-20 MED ORDER — PANTOPRAZOLE SODIUM 40 MG IV SOLR
40.0000 mg | Freq: Two times a day (BID) | INTRAVENOUS | Status: DC
  Administered 2023-07-21 – 2023-07-27 (×13): 40 mg via INTRAVENOUS
  Filled 2023-07-20 (×14): qty 10

## 2023-07-20 MED ORDER — ONDANSETRON HCL 4 MG/2ML IJ SOLN
4.0000 mg | Freq: Four times a day (QID) | INTRAMUSCULAR | Status: DC | PRN
  Administered 2023-07-21 – 2023-07-23 (×4): 4 mg via INTRAVENOUS
  Filled 2023-07-20 (×3): qty 2

## 2023-07-20 MED ORDER — SODIUM CHLORIDE 0.9% IV SOLUTION: Freq: Once | INTRAVENOUS | Status: AC

## 2023-07-20 MED ORDER — LACTATED RINGERS IV BOLUS
1000.0000 mL | Freq: Once | INTRAVENOUS | Status: AC
Start: 2023-07-20 — End: 2023-07-20
  Administered 2023-07-20: 1000 mL via INTRAVENOUS

## 2023-07-20 MED ORDER — SODIUM CHLORIDE 0.9% IV SOLUTION: Freq: Once | INTRAVENOUS | Status: DC

## 2023-07-20 NOTE — ED Notes (Signed)
This paramedic sat with patient for first 20 minutes of blood administration. No side effects noted.

## 2023-07-20 NOTE — H&P (Addendum)
History and Physical    MALLARIE GLOGOWSKI DJM:[id number] DOB: 11-01-1945 DOA: 07/20/2023  PCP: Dominic Pea, PA-C   Patient coming from: Home  I have personally briefly reviewed patient's old medical records in [facility]  Chief Complaint: Bloody diarrhea  HPI: Peggy Armstrong is a 78 y.o. female with medical history significant of 78 y.o. female, with history of hypertension, hyperlipidemia, chronic diastolic heart failure, rheumatoid arthritis, prior history of DVT, chronic atrial fibrillation, duodenal adenoma, L2 compression fracture with recent admissions for recurrent GI bleeding requiring blood transfusion with most recent admission and discharge from 06/20/2023-06/21/2023 for GI bleeding with hemoglobin of 5.8 requiring 2 units packed red cell transfusion and Eliquis held during and after discharge.  She apparently had an EGD done a week ago at [facility] and Eliquis was subsequently resumed.  She presented with bloody diarrhea.  She apparently woke up this morning, felt nauseated, had diarrhea which had dark red blood.  She denies any vomiting, fever, worsening abdominal pain, chest pain, shortness of breath, cough, dysuria, increased frequency of urination, loss of consciousness or seizures.  ED Course: Blood pressures were initially soft which improved with IV fluids and blood transfusion.  Initial hemoglobin was 7.8 with repeat hemoglobin of 6.8.  1 unit packed red cell transfusion ordered by ED provider.  Eliquis was reversed.  Patient was started on IV Protonix.  CT angio of abdomen and pelvis showed active GI bleeding within second and third portions of the duodenum.  GI and IR consulted by ED provider.  IR was of the opinion that the bleeding source was too proximal for any kind of IR intervention.  GI will evaluate the patient soon.  ED provider spoke to ICU provider on-call who recommended that patient was stable enough for hospitalist admission. Hospitalist service was called  to evaluate the patient.  Review of Systems: As per HPI otherwise all other systems were reviewed and are negative.   Past Medical History:  Diagnosis Date   Acquired hypothyroidism    Atrial fibrillation (HCC)    Back injury    DVT (deep venous thrombosis) (HCC)    right DVT   GAD (generalized anxiety disorder)    GERD (gastroesophageal reflux disease)    Gout    Gout    Hypertension    RA (rheumatoid arthritis) (HCC)     Past Surgical History:  Procedure Laterality Date   ABDOMINAL HYSTERECTOMY     BIOPSY  05/16/2023   Procedure: BIOPSY;  Surgeon: Meryl Dare, MD;  Location: [facility];  Service: Gastroenterology;;   CHOLECYSTECTOMY     ENDOSCOPIC RETROGRADE CHOLANGIOPANCREATOGRAPHY (ERCP) WITH PROPOFOL N/A 06/15/2017   Procedure: ENDOSCOPIC RETROGRADE CHOLANGIOPANCREATOGRAPHY (ERCP) WITH PROPOFOL;  Surgeon: Rachael Fee, MD;  Location: [facility];  Service: Endoscopy;  Laterality: N/A;   ESOPHAGOGASTRODUODENOSCOPY (EGD) WITH PROPOFOL N/A 03/24/2017   Procedure: ESOPHAGOGASTRODUODENOSCOPY (EGD) WITH PROPOFOL;  Surgeon: Sherrilyn Rist, MD;  Location: [facility];  Service: Gastroenterology;  Laterality: N/A;   ESOPHAGOGASTRODUODENOSCOPY (EGD) WITH PROPOFOL N/A 11/07/2017   Procedure: ESOPHAGOGASTRODUODENOSCOPY (EGD) WITH PROPOFOL;  Surgeon: Rachael Fee, MD;  Location: [facility];  Service: Endoscopy;  Laterality: N/A;   ESOPHAGOGASTRODUODENOSCOPY (EGD) WITH PROPOFOL N/A 05/16/2023   Procedure: ESOPHAGOGASTRODUODENOSCOPY (EGD) WITH PROPOFOL;  Surgeon: Meryl Dare, MD;  Location: [facility];  Service: Gastroenterology;  Laterality: N/A;   EUS N/A 04/06/2017   Procedure: UPPER ENDOSCOPIC ULTRASOUND (EUS) LINEAR;  Surgeon: Rachael Fee, MD;  Location: [facility];  Service: Endoscopy;  Laterality: N/A;  to evaluate duodenal mass   HERNIA REPAIR     IR BILIARY DRAIN PLACEMENT WITH CHOLANGIOGRAM  03/25/2017   IR CONVERT BILIARY DRAIN TO INT EXT  BILIARY DRAIN  04/03/2017   POLYPECTOMY  05/16/2023   Procedure: POLYPECTOMY;  Surgeon: Meryl Dare, MD;  Location: [facility];  Service: Gastroenterology;;     reports that she has quit smoking. Her smoking use included cigarettes. She has been exposed to tobacco smoke. She has quit using smokeless tobacco. She reports that she does not drink alcohol and does not use drugs.  Allergies  Allergen Reactions   Codeine Nausea And Vomiting    Made "very sick"   Tape     Family History  Problem Relation Age of Onset   Hypertension Mother    Prostate cancer Father    Colon cancer Father    Hypertension Brother    Rheum arthritis Maternal Grandmother    Diabetes Paternal Grandmother    Heart disease Paternal Grandfather     Prior to Admission medications   Medication Sig Start Date End Date Taking? Authorizing Provider  acetaminophen (TYLENOL) 325 MG tablet Take 650 mg by mouth every 6 (six) hours as needed for mild pain (pain score 1-3) or moderate pain (pain score 4-6).   Yes [provider]  allopurinol (ZYLOPRIM) 300 MG tablet Take 1 tablet (300 mg total) by mouth daily. 07/18/23  Yes Rothfuss, Teryl Lucy, PA-C  amiodarone (PACERONE) 200 MG tablet Take 1 tablet (200 mg total) by mouth daily. Please call [phone] to schedule cardiology follow up for further refills. 06/22/23  Yes Alver Sorrow, NP  Ascorbic Acid (VITAMIN C CR) 500 MG TBCR Take 500 mg by mouth daily.   Yes [provider]  atorvastatin (LIPITOR) 20 MG tablet Take 1 tablet (20 mg total) by mouth daily. 12/19/22  Yes Alver Sorrow, NP  busPIRone (BUSPAR) 10 MG tablet Take 1 tablet (10 mg total) by mouth 2 (two) times daily. 07/18/23  Yes Rothfuss, Teryl Lucy, PA-C  cholecalciferol (VITAMIN D) 1000 units tablet Take 1,000 Units by mouth daily.   Yes [provider]  cyanocobalamin (VITAMIN B12) 1000 MCG tablet Take 1 tablet by mouth daily.   Yes [provider]  ELIQUIS 5 MG TABS  tablet Take 5 mg by mouth 2 (two) times daily. 07/02/23  Yes [provider]  ferrous sulfate 325 (65 FE) MG tablet Take 325 mg by mouth daily with breakfast.   Yes [provider]  folic acid (FOLVITE) 1 MG tablet Take 1 tablet (1 mg total) by mouth daily. 05/18/23  Yes Osvaldo Shipper, MD  furosemide (LASIX) 20 MG tablet Take 1 tablet (20 mg total) by mouth daily. 07/18/23  Yes Rothfuss, Teryl Lucy, PA-C  KLOR-CON M20 20 MEQ tablet Take 20 mEq by mouth daily.   Yes [provider]  levothyroxine (SYNTHROID) 100 MCG tablet Take 1 tablet (100 mcg total) by mouth daily before breakfast. 07/18/23  Yes Rothfuss, Gerilyn Pilgrim T, PA-C  melatonin 3 MG TABS tablet Take 1 tablet (3 mg total) by mouth at bedtime as needed (insomnia). 05/18/23  Yes Osvaldo Shipper, MD  ondansetron (ZOFRAN) 4 MG tablet Take 4 mg by mouth every 6 (six) hours as needed for nausea or vomiting.   Yes [provider]  oxyCODONE (OXY IR/ROXICODONE) 5 MG immediate release tablet Take 5 mg by mouth as needed for severe pain (pain score 7-10).   Yes [provider]  pantoprazole (  PROTONIX) 40 MG tablet Take 1 tablet (40 mg total) by mouth daily. 07/18/23  Yes Rothfuss, Teryl Lucy, PA-C  sertraline (ZOLOFT) 25 MG tablet Take 1 tablet (25 mg total) by mouth daily. 07/18/23  Yes Rothfuss, Teryl Lucy, PA-C  tiZANidine (ZANAFLEX) 4 MG tablet Take 1 tablet (4 mg total) by mouth every 6 (six) hours as needed for muscle spasms. 07/18/23  Yes Rothfuss, Ouida Sills    Physical Exam: Vitals:   07/20/23 1550 07/20/23 1600 07/20/23 1630 07/20/23 1645  BP: 93/83 104/68 100/66 98/69  Pulse: 85 83 83 84  Resp: (!) 25 (!) 22 13 20   Temp:      TempSrc:      SpO2: 100% 100% 100% 99%    Constitutional: NAD, calm, comfortable.  Looks chronically ill and deconditioned.  On room air. Vitals:   07/20/23 1550 07/20/23 1600 07/20/23 1630 07/20/23 1645  BP: 93/83 104/68 100/66 98/69  Pulse: 85 83 83 84  Resp: (!) 25 (!) 22 13  20   Temp:      TempSrc:      SpO2: 100% 100% 100% 99%   Eyes: PERRL, lids and conjunctivae normal ENMT: Mucous membranes are moist. Posterior pharynx clear of any exudate or lesions. Neck: normal, supple, no masses, no thyromegaly Respiratory: bilateral decreased breath sounds at bases, no wheezing, no crackles. Normal respiratory effort. No accessory muscle use.  Cardiovascular: S1 S2 positive, rate controlled. No extremity edema. 2+ pedal pulses.  Abdomen: Obese, no tenderness, no masses palpated. No hepatosplenomegaly. Bowel sounds positive.  Musculoskeletal: no clubbing / cyanosis. No joint deformity upper and lower extremities.  Skin: no rashes, lesions, ulcers. No induration Neurologic: CN 2-12 grossly intact. Moving extremities. No focal neurologic deficits.  Psychiatric: Flat affect.  Not agitated.  Labs on Admission: I have personally reviewed following labs and imaging studies  CBC: Recent Labs  Lab 07/18/23 1542 07/20/23 1213 07/20/23 1401  WBC 4.9 8.8  --   NEUTROABS 3.8 7.1  --   HGB 10.4* 7.8* 6.8*  HCT 34.2 26.5* 22.7*  MCV 97 101.5*  --   PLT 116* 150  --    Basic Metabolic Panel: Recent Labs  Lab 07/18/23 1542 07/20/23 1213  NA 141 140  K 4.4 4.2  CL 102 105  CO2 23 23  GLUCOSE 96 147*  BUN 18 26*  CREATININE 0.93 1.09*  CALCIUM 8.5* 7.8*   GFR: Estimated Creatinine Clearance: 42.2 mL/min (A) (by C-G formula based on SCr of 1.09 mg/dL (H)). Liver Function Tests: Recent Labs  Lab 07/18/23 1542 07/20/23 1213  AST 48* 42*  ALT 30 26  ALKPHOS 160* 99  BILITOT 0.3 0.6  PROT 5.7* 4.7*  ALBUMIN 3.3* 2.4*   No results for input(s): "LIPASE", "AMYLASE" in the last 168 hours. No results for input(s): "AMMONIA" in the last 168 hours. Coagulation Profile: Recent Labs  Lab 07/20/23 1213  INR 1.6*   Cardiac Enzymes: No results for input(s): "CKTOTAL", "CKMB", "CKMBINDEX", "TROPONINI" in the last 168 hours. BNP (last 3 results) No results for  input(s): "PROBNP" in the last 8760 hours. HbA1C: Recent Labs    07/18/23 1542  HGBA1C 4.3*   CBG: No results for input(s): "GLUCAP" in the last 168 hours. Lipid Profile: Recent Labs    07/18/23 1542  CHOL 127  HDL 25*  LDLCALC 80  TRIG 130  CHOLHDL 5.1*   Thyroid Function Tests: Recent Labs    07/18/23 1542  TSH 5.230*   Anemia Panel: No  results for input(s): "VITAMINB12", "FOLATE", "FERRITIN", "TIBC", "IRON", "RETICCTPCT" in the last 72 hours. Urine analysis:    Component Value Date/Time   COLORURINE YELLOW 05/11/2023 1721   APPEARANCEUR CLOUDY (A) 05/11/2023 1721   LABSPEC 1.012 05/11/2023 1721   PHURINE 7.0 05/11/2023 1721   GLUCOSEU NEGATIVE 05/11/2023 1721   HGBUR LARGE (A) 05/11/2023 1721   BILIRUBINUR NEGATIVE 05/11/2023 1721   KETONESUR NEGATIVE 05/11/2023 1721   PROTEINUR NEGATIVE 05/11/2023 1721   NITRITE POSITIVE (A) 05/11/2023 1721   LEUKOCYTESUR MODERATE (A) 05/11/2023 1721    Radiological Exams on Admission: CT Angio Abd/Pel W and/or Wo Contrast Result Date: 07/20/2023 CLINICAL DATA:  Dark red blood in stool since this morning, dizziness, anticoagulated EXAM: CTA ABDOMEN AND PELVIS WITHOUT AND WITH CONTRAST TECHNIQUE: Multidetector CT imaging of the abdomen and pelvis was performed using the standard protocol during bolus administration of intravenous contrast. Multiplanar reconstructed images and MIPs were obtained and reviewed to evaluate the vascular anatomy. RADIATION DOSE REDUCTION: This exam was performed according to the departmental dose-optimization program which includes automated exposure control, adjustment of the mA and/or kV according to patient size and/or use of iterative reconstruction technique. CONTRAST:  75mL OMNIPAQUE IOHEXOL 350 MG/ML SOLN COMPARISON:  05/15/2023 FINDINGS: VASCULAR Aorta: Normal caliber aorta without aneurysm, dissection, vasculitis or significant stenosis. Diffuse atherosclerosis. Celiac: Patent without evidence of  aneurysm, dissection, vasculitis or significant stenosis. SMA: Patent without evidence of aneurysm, dissection, vasculitis or significant stenosis. 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 Renals: Both renal arteries are patent without evidence of aneurysm, dissection, vasculitis, fibromuscular dysplasia or significant stenosis. Mild atherosclerosis without critical stenosis. IMA: Patent without evidence of aneurysm, dissection, vasculitis or significant stenosis. Inflow: There is diffuse atherosclerosis throughout the iliac vessels. Focal high-grade stenosis at the origin of the left iliac artery estimated greater than 70%. Remaining vessels are patent. No aneurysm, dissection, or vasculitis. Proximal Outflow: Bilateral common femoral and visualized portions of the superficial and profunda femoral arteries are patent without evidence of aneurysm, dissection, vasculitis or significant stenosis. Diffuse atherosclerosis. Veins: No obvious venous abnormality within the limitations of this arterial phase study. Review of the MIP images confirms the above findings. NON-VASCULAR Lower chest: Trace right pleural fluid.  Moderate hiatal hernia. Hepatobiliary: No focal liver abnormality is seen. Status post cholecystectomy. No biliary dilatation. Pancreas: Unremarkable. No pancreatic ductal dilatation or surrounding inflammatory changes. Spleen: Normal in size without focal abnormality. Adrenals/Urinary Tract: Left renal cortical cyst do not require specific imaging follow-up. There is bilateral renal cortical thinning. No urinary tract calculi or obstructive uropathy. Adrenals and bladder are unremarkable. Stomach/Bowel: No bowel obstruction or ileus. Moderate hiatal hernia. There is abnormal mural thickening involving the second and third portions of the duodenum, with mild adjacent fat stranding. Active gastrointestinal bleeding is identified within this segment of the duodenum. Differential  diagnosis would include duodenitis, peptic ulcer disease, or neoplasm. Endoscopy is recommended for further evaluation. Lymphatic: There are several subcentimeter lymph nodes adjacent to the duodenal wall thickening, largest measuring 7 mm reference image 46/5. No pathologic adenopathy within the abdomen or pelvis. Reproductive: Status post hysterectomy. No adnexal masses. Other: Trace pelvic free fluid. No free intraperitoneal gas. No abdominal wall hernia. Musculoskeletal: No acute or destructive bony abnormalities. Chronic L2 compression deformity with resulting vertebra plana unchanged since prior exam. Healing fracture through the superior margin of the S2 vertebral body, with increased callus formation since prior exam. Reconstructed images demonstrate no additional findings. IMPRESSION: VASCULAR 1. Active gastrointestinal bleeding  within the second and third portions of the duodenum, associated with segmental duodenal wall thickening and fat stranding. 2. Aortic Atherosclerosis (ICD10-I70.0). Atherosclerosis throughout the branch vessels as above, with focal high-grade stenosis at the origin of the left internal iliac artery. NON-VASCULAR 1. Wall thickening of the second and third portion duodenum, with mild surrounding fat stranding and subcentimeter lymph nodes. Differential diagnosis includes duodenitis, peptic ulcer disease, or neoplasm. Endoscopy is recommended for further evaluation. 2. Moderate hiatal hernia. 3. Trace pelvic free fluid. Critical Value/emergent results were called by telephone at the time of interpretation on 07/20/2023 at 4:02 pm to provider Marita Kansas, PA, who verbally acknowledged these results. Electronically Signed   By: Sharlet Salina M.D.   On: 07/20/2023 16:05      Assessment/Plan  Acute blood loss anemia on top of anemia of chronic disease Recurrent GI bleeding, most likely duodenal bleeding in a patient with known history of duodenal adenoma -Patient presented with  bloody diarrhea this morning.  Hemoglobin 7.8 on presentation and repeat hemoglobin was 6.8.  Hemoglobin was 10.4 a few days ago and was 8 on 06/21/2023 during discharge. - 1 unit packed red cell transfusion ordered by ED provider.  Eliquis was reversed.  Patient was started on IV Protonix.  CT angio of abdomen and pelvis showed active GI bleeding within second and third portions of the duodenum.  GI and IR consulted by ED provider.  IR was of the opinion that the bleeding source was too proximal for any kind of IR intervention.  GI will evaluate the patient soon.  ED provider spoke to ICU provider on-call who recommended that patient was stable enough for hospitalist admission. -Monitor H&H.  Continue IV Protonix.  Blood pressures currently stable but if blood pressures drop, might need transfer to ICU  Chronic atrial fibrillation -Currently rate controlled.  Eliquis on hold.  Eliquis has been reversed in the ED.  Will resume amiodarone once able to take orally  Hyperlipidemia -Resume statin once able to take orally  Hypothyroidism -Resume levothyroxine once able to take orally  Anxiety/depression -Resume home regimen of sertraline and BuSpar once able to take orally  Obesity -Outpatient follow-up  DVT prophylaxis: SCDs Code Status: Full Family Communication: Daughter and granddaughter at bedside Disposition Plan: Home in 1 to 2 days once clinically improved Consults called: GI and IR consulted by ED provider Admission status: Observation/telemetry  Severity of Illness: The appropriate patient status for this patient is OBSERVATION. Observation status is judged to be reasonable and necessary in order to provide the required intensity of service to ensure the patient's safety. The patient's presenting symptoms, physical exam findings, and initial radiographic and laboratory data in the context of their medical condition is felt to place them at decreased risk for further clinical  deterioration. Furthermore, it is anticipated that the patient will be medically stable for discharge from the hospital within 2 midnights of admission.     Glade Lloyd MD [facility]  07/20/2023, 5:15 PM

## 2023-07-20 NOTE — ED Provider Notes (Signed)
  Physical Exam  BP (!) 120/95   Pulse 85   Temp 98.3 F (36.8 C) (Axillary)   Resp 17   Ht 5' (1.524 m)   Wt 84.6 kg   SpO2 92%   BMI 36.43 kg/m   Physical Exam  Procedures  Procedures  ED Course / MDM   Clinical Course as of 07/21/23 1348  Thu Jul 20, 2023  1518 Assumed care from Dr Andria Meuse. 78 yo F with hx of duodenal adenoma with recent endoscopy who presented with bloody stools. Hgb is dropping so was transfused. Is on eliquis for AF getting Kcentra. Awaiting CTA at this time.  [RP]  1617 CTA shows active extravasation in the second and third portions of the duodenum.  Interventional radiology consulted.  Dr. Elby Showers aware and will call back shortly when he finishes up a case.  Currently patient is hemodynamically stable. [RP]  1647 Amy Esterwood from GI consulted they will see the patient shortly.  [RP]  1648 Dr Fredia Sorrow from IR recommends GI consult.  [RP]  1709 Discussed with Posey Boyer from [facility].  They feel the patient can go to stepdown because she is currently hemodynamically stable. [RP]  1710 Discussed with Dr. Hanley Ben who will admit to hospitalist.  Blood pressure remained stable with IV fluids and single unit of blood. [RP]    Clinical Course User Index [RP] Rondel Baton, MD   Medical Decision Making Amount and/or Complexity of Data Reviewed Labs: ordered. Radiology: ordered.  Risk Prescription drug management. Decision regarding hospitalization.      Rondel Baton, MD 07/21/23 [phone]

## 2023-07-20 NOTE — Consult Note (Addendum)
Consultation  Referring Provider: No ref. provider found Primary Care Physician:  Rothfuss, Teryl Lucy, PA-C Primary Gastroenterologist:  Dr.Armbruster  Reason for Consultation: Acute GI bleed in setting of Eliquis-patient with known duodenal adenoma, status post endoscopic mucosal resection at [facility] on 07/11/2023, incomplete resection   HPI: Peggy Armstrong is a 78 y.o. female known to Dr. Adela Lank who was seen by our service when she was hospitalized in late December with profound anemia and heme positive stool.  She has history of congestive heart failure, rheumatoid arthritis, atrial fibrillation for which she is on chronic Eliquis, hypothyroidism and GERD, and chronic kidney disease. She had history of ampullary polyp involving the major papilla diagnosed in 2018 and underwent ampullectomy in 2019 per Dr. Corliss Parish at [facility].  Path at that time showed an an adenomatous polyp with intestinal differentiation can containing extensive high-grade dysplasia and intramucosal adenocarcinoma and focal areas of highly suspicious for submucosal invasion, margin indeterminant. She had ERCP/EUS February 2019 Dr. Jamse Mead with finding of a visibly patent stent from the biliary.,  Or went tissue destruction in the area of the papilla with APC and EUS showed a normal-appearing stomach and no significant pathology in the pancreatic head But had follow-up EGD May 2019 Dr. Jamse Mead normal stomach, evidence of prior ampullectomy and a single duodenal polyp was removed  She was seen in November 2024 here when she was hospitalized after a lumbar fracture and had hemoglobin in the 7 range with melenic stool.  She had EGD on 05/16/2023 with finding of LA grade B esophagitis in the distal esophagus medium size hiatal hernia, multiple gastric polyps 2 of which were oozing and were resected and a likely malignant duodenal mass, medium sized fungating and multilobulated with no obvious bleeding. 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 She was referred to Raylene Miyamoto for resection. Was delay in obtaining cardiac clearance, which was done when she was hospitalized on 06/20/2023.  Time hemoglobin was 5.9, she was transfused. She was seen by cardiology and cleared for her procedure  She underwent GD with endoscopic mucosal resection of a very large multilobulated duodenal polyp in the second portion of the duodenum this measured greater than 50 mm.  She had snare mucosal resection, then Lucina Mellow net and snare retrieval 40 mm area was resected the resection was incomplete and felt to have about 50 to 60% of the lesion resected. 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  She presented to the emergency room, was mildly hypotensive noted to have dark blood on digital exam. Initial labs show WBC of 8.8/hemoglobin 7.8 (down from 10.4 on 07/18/2023)  BUN26/creatinine 1.09 Pro time 19.5/INR 1.6  She was given Theodoro Parma She underwent CT angio abdomen /pelvis around 2 pm which shows abnormal mural thickening involving the second and third portion of the duodenum with mild adjacent fat stranding active bleeding identified within this segment of the duodenum and endoscopy recommended for further evaluation  Repeat hemoglobin down to 6.8 and she is currently being transfused 1 unit of packed RBCs  Stable in the low 100s, again she has not had any bowel movement since midmorning.  Denies any nausea or abdominal pain  Past Medical History:  Diagnosis Date   Acquired hypothyroidism    Atrial fibrillation (HCC)    Back injury    DVT (deep venous thrombosis) (HCC)    right DVT    GAD (generalized anxiety disorder)    GERD (gastroesophageal reflux disease)    Gout    Gout    Hypertension    RA (rheumatoid arthritis) (HCC)     Past Surgical History:  Procedure Laterality Date   ABDOMINAL HYSTERECTOMY     BIOPSY  05/16/2023   Procedure: BIOPSY;  Surgeon: Meryl Dare, MD;  Location: [facility];  Service: Gastroenterology;;   CHOLECYSTECTOMY     ENDOSCOPIC RETROGRADE CHOLANGIOPANCREATOGRAPHY (ERCP) WITH PROPOFOL N/A 06/15/2017   Procedure: ENDOSCOPIC RETROGRADE CHOLANGIOPANCREATOGRAPHY (ERCP) WITH PROPOFOL;  Surgeon: Rachael Fee, MD;  Location: [facility];  Service: Endoscopy;  Laterality: N/A;   ESOPHAGOGASTRODUODENOSCOPY (EGD) WITH PROPOFOL N/A 03/24/2017   Procedure: ESOPHAGOGASTRODUODENOSCOPY (EGD) WITH PROPOFOL;  Surgeon: Sherrilyn Rist, MD;  Location: [facility];  Service: Gastroenterology;  Laterality: N/A;   ESOPHAGOGASTRODUODENOSCOPY (EGD) WITH PROPOFOL N/A 11/07/2017   Procedure: ESOPHAGOGASTRODUODENOSCOPY (EGD) WITH PROPOFOL;  Surgeon: Rachael Fee, MD;  Location: [facility];  Service: Endoscopy;  Laterality: N/A;   ESOPHAGOGASTRODUODENOSCOPY (EGD) WITH PROPOFOL N/A 05/16/2023   Procedure: ESOPHAGOGASTRODUODENOSCOPY (EGD) WITH PROPOFOL;  Surgeon: Meryl Dare, MD;  Location: [facility];  Service: Gastroenterology;  Laterality: N/A;   EUS N/A 04/06/2017   Procedure: UPPER ENDOSCOPIC ULTRASOUND (EUS) LINEAR;  Surgeon: Rachael Fee, MD;  Location: [facility];  Service: Endoscopy;  Laterality: N/A;  to evaluate duodenal mass   HERNIA REPAIR     IR BILIARY DRAIN PLACEMENT WITH CHOLANGIOGRAM  03/25/2017   IR CONVERT BILIARY DRAIN TO INT EXT BILIARY DRAIN  04/03/2017   POLYPECTOMY  05/16/2023   Procedure: POLYPECTOMY;  Surgeon: Meryl Dare, MD;  Location: [facility];  Service: Gastroenterology;;    Prior to Admission medications   Medication Sig Start Date End Date Taking? Authorizing Provider  acetaminophen  (TYLENOL) 325 MG tablet Take 650 mg by mouth every 6 (six) hours as needed for mild pain (pain score 1-3) or moderate pain (pain score 4-6).   Yes [provider]  allopurinol (ZYLOPRIM) 300 MG tablet Take 1 tablet (300 mg total) by mouth daily. 07/18/23  Yes Rothfuss, Teryl Lucy, PA-C  amiodarone (PACERONE) 200 MG tablet Take 1 tablet (200 mg total) by mouth daily. Please call [phone] to schedule cardiology follow up for further refills. 06/22/23  Yes Alver Sorrow, NP  Ascorbic Acid (VITAMIN C CR) 500 MG TBCR Take 500 mg by mouth daily.   Yes [provider]  atorvastatin (LIPITOR) 20 MG tablet Take 1 tablet (20 mg total) by mouth daily. 12/19/22  Yes Alver Sorrow, NP  busPIRone (BUSPAR) 10 MG tablet Take 1 tablet (10 mg total) by mouth 2 (two) times daily. 07/18/23  Yes Rothfuss, Teryl Lucy, PA-C  cholecalciferol (VITAMIN D) 1000 units tablet Take 1,000 Units by mouth daily.   Yes [provider]  cyanocobalamin (VITAMIN B12) 1000 MCG tablet Take 1 tablet by mouth daily.   Yes [provider]  ELIQUIS 5 MG TABS tablet Take 5 mg by mouth 2 (two) times daily. 07/02/23  Yes [provider]  ferrous sulfate 325 (65 FE) MG tablet Take 325 mg by mouth daily with breakfast.   Yes [provider]  folic acid (FOLVITE) 1 MG tablet Take 1 tablet (1 mg total) by mouth daily. 05/18/23  Yes Osvaldo Shipper,  MD  furosemide (LASIX) 20 MG tablet Take 1 tablet (20 mg total) by mouth daily. 07/18/23  Yes Rothfuss, Teryl Lucy, PA-C  KLOR-CON M20 20 MEQ tablet Take 20 mEq by mouth daily.   Yes [provider]  levothyroxine (SYNTHROID) 100 MCG tablet Take 1 tablet (100 mcg total) by mouth daily before breakfast. 07/18/23  Yes Rothfuss, Gerilyn Pilgrim T, PA-C  melatonin 3 MG TABS tablet Take 1 tablet (3 mg total) by mouth at bedtime as needed (insomnia). 05/18/23  Yes Osvaldo Shipper, MD  ondansetron (ZOFRAN) 4 MG tablet Take 4 mg by mouth every 6 (six) hours as  needed for nausea or vomiting.   Yes [provider]  oxyCODONE (OXY IR/ROXICODONE) 5 MG immediate release tablet Take 5 mg by mouth as needed for severe pain (pain score 7-10).   Yes [provider]  pantoprazole (PROTONIX) 40 MG tablet Take 1 tablet (40 mg total) by mouth daily. 07/18/23  Yes Rothfuss, Teryl Lucy, PA-C  sertraline (ZOLOFT) 25 MG tablet Take 1 tablet (25 mg total) by mouth daily. 07/18/23  Yes Rothfuss, Teryl Lucy, PA-C  tiZANidine (ZANAFLEX) 4 MG tablet Take 1 tablet (4 mg total) by mouth every 6 (six) hours as needed for muscle spasms. 07/18/23  Yes Rothfuss, Teryl Lucy, PA-C    Current Facility-Administered Medications  Medication Dose Route Frequency Provider Last Rate Last Admin   0.9 %  sodium chloride infusion   Intravenous Continuous Alekh, Kshitiz, MD       ondansetron (ZOFRAN) tablet 4 mg  4 mg Oral Q6H PRN Glade Lloyd, MD       Or   ondansetron (ZOFRAN) injection 4 mg  4 mg Intravenous Q6H PRN Glade Lloyd, MD       Melene Muller ON 07/21/2023] pantoprazole (PROTONIX) injection 40 mg  40 mg Intravenous Q12H Glade Lloyd, MD       Current Outpatient Medications  Medication Sig Dispense Refill   acetaminophen (TYLENOL) 325 MG tablet Take 650 mg by mouth every 6 (six) hours as needed for mild pain (pain score 1-3) or moderate pain (pain score 4-6).     allopurinol (ZYLOPRIM) 300 MG tablet Take 1 tablet (300 mg total) by mouth daily. 90 tablet 3   amiodarone (PACERONE) 200 MG tablet Take 1 tablet (200 mg total) by mouth daily. Please call [phone] to schedule cardiology follow up for further refills. 30 tablet 2   Ascorbic Acid (VITAMIN C CR) 500 MG TBCR Take 500 mg by mouth daily.     atorvastatin (LIPITOR) 20 MG tablet Take 1 tablet (20 mg total) by mouth daily. 60 tablet 0   busPIRone (BUSPAR) 10 MG tablet Take 1 tablet (10 mg total) by mouth 2 (two) times daily. 60 tablet 4   cholecalciferol (VITAMIN D) 1000 units tablet Take 1,000 Units by mouth daily.      cyanocobalamin (VITAMIN B12) 1000 MCG tablet Take 1 tablet by mouth daily.     ELIQUIS 5 MG TABS tablet Take 5 mg by mouth 2 (two) times daily.     ferrous sulfate 325 (65 FE) MG tablet Take 325 mg by mouth daily with breakfast.     folic acid (FOLVITE) 1 MG tablet Take 1 tablet (1 mg total) by mouth daily.     furosemide (LASIX) 20 MG tablet Take 1 tablet (20 mg total) by mouth daily. 30 tablet 4   KLOR-CON M20 20 MEQ tablet Take 20 mEq by mouth daily.     levothyroxine (SYNTHROID) 100 MCG tablet  Take 1 tablet (100 mcg total) by mouth daily before breakfast. 30 tablet 5   melatonin 3 MG TABS tablet Take 1 tablet (3 mg total) by mouth at bedtime as needed (insomnia).     ondansetron (ZOFRAN) 4 MG tablet Take 4 mg by mouth every 6 (six) hours as needed for nausea or vomiting.     oxyCODONE (OXY IR/ROXICODONE) 5 MG immediate release tablet Take 5 mg by mouth as needed for severe pain (pain score 7-10).     pantoprazole (PROTONIX) 40 MG tablet Take 1 tablet (40 mg total) by mouth daily. 30 tablet 5   sertraline (ZOLOFT) 25 MG tablet Take 1 tablet (25 mg total) by mouth daily. 30 tablet 5   tiZANidine (ZANAFLEX) 4 MG tablet Take 1 tablet (4 mg total) by mouth every 6 (six) hours as needed for muscle spasms. 60 tablet 2    Allergies as of 07/20/2023 - Review Complete 07/20/2023  Allergen Reaction Noted   Codeine Nausea And Vomiting 04/18/2011   Tape  06/20/2023    Family History  Problem Relation Age of Onset   Hypertension Mother    Prostate cancer Father    Colon cancer Father    Hypertension Brother    Rheum arthritis Maternal Grandmother    Diabetes Paternal Grandmother    Heart disease Paternal Grandfather     Social History   Socioeconomic History   Marital status: Divorced    Spouse name: Not on file   Number of children: 2   Years of education: Not on file   Highest education level: Not on file  Occupational History   Not on file  Tobacco Use   Smoking status: Former     Types: Cigarettes    Passive exposure: Past   Smokeless tobacco: Former   Tobacco comments:    Quit in 2012. Started at 20's. Cannot recall how many.   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  Other Topics Concern   Not on file  Social History Narrative   Not on file   Social Drivers of Health   Financial Resource Strain: Low Risk  (12/02/2022)   Received from [facility], [facility]   Overall Financial Resource Strain (CARDIA)    Difficulty of Paying Living Expenses: Not very hard  Food Insecurity: No Food Insecurity (06/20/2023)   Hunger Vital Sign    Worried About Running Out of Food in the Last Year: Never true    Ran Out of Food in the Last Year: Never true  Transportation Needs: No Transportation Needs (06/20/2023)   PRAPARE - Administrator, Civil Service (Medical): No    Lack of Transportation (Non-Medical): No  Physical Activity: Unknown (04/05/2022)   Received from [facility], [facility]   Exercise Vital Sign    Days of Exercise per Week: 0 days    Minutes of Exercise per Session: Not on file  Stress: No Stress Concern Present (04/05/2022)   Received from [facility], [facility] of Occupational [facility] - Occupational Stress Questionnaire    Feeling of Stress : Not at all  Social Connections: Unknown (04/06/2023)   Received from [facility]   Social Network    Social Network: Not on file  Intimate Partner Violence: Not At Risk (06/20/2023)   Humiliation,  Afraid, Rape, and Kick questionnaire    Fear of Current or Ex-Partner: No    Emotionally Abused: No    Physically Abused: No    Sexually Abused: No    Review of Systems: Pertinent positive and negative review of systems were noted in the above HPI section.  All other review of systems was otherwise negative.  Physical  Exam: Vital signs in last 24 hours: Temp:  [97.8 F (36.6 C)] 97.8 F (36.6 C) (01/23 1519) Pulse Rate:  [76-92] 84 (01/23 1645) Resp:  [13-27] 20 (01/23 1645) BP: (78-113)/(32-89) 98/69 (01/23 1645) SpO2:  [94 %-100 %] 99 % (01/23 1645)   General:   Alert,  Well-developed, well-nourished, elderly white female pleasant and cooperative in NAD pale- family at bedside Head:  Normocephalic and atraumatic. Eyes:  Sclera clear, no icterus.   Conjunctiva pale Ears:  Normal auditory acuity. Nose:  No deformity, discharge,  or lesions. Mouth:  No deformity or lesions.   Neck:  Supple; no masses or thyromegaly. Lungs:  Clear throughout to auscultation.   No wheezes, crackles, or rhonchi.  Heart:  Regular rate and rhythm; no murmurs, clicks, rubs,  or gallops. Abdomen:  Soft, obese, nontender, BS active,nonpalp mass or hsm.   Rectal: Done per ER MD with dark red blood noted Msk:  Symmetrical without gross deformities. . Pulses:  Normal pulses noted. Extremities:  Without clubbing or edema. Neurologic:  Alert and  oriented x4;  grossly normal neurologically. Skin:  Intact without significant lesions or rashes.. Psych:  Alert and cooperative. Normal mood and affect.  Intake/Output from previous day: No intake/output data recorded. Intake/Output this shift: No intake/output data recorded.  Lab Results: Recent Labs    07/18/23 1542 07/20/23 1213 07/20/23 1401  WBC 4.9 8.8  --   HGB 10.4* 7.8* 6.8*  HCT 34.2 26.5* 22.7*  PLT 116* 150  --    BMET Recent Labs    07/18/23 1542 07/20/23 1213  NA 141 140  K 4.4 4.2  CL 102 105  CO2 23 23  GLUCOSE 96 147*  BUN 18 26*  CREATININE 0.93 1.09*  CALCIUM 8.5* 7.8*   LFT Recent Labs    07/20/23 1213  PROT 4.7*  ALBUMIN 2.4*  AST 42*  ALT 26  ALKPHOS 99  BILITOT 0.6   PT/INR Recent Labs    07/20/23 1213  LABPROT 19.5*  INR 1.6*   Hepatitis Panel No results for input(s): "HEPBSAG", "HCVAB", "HEPAIGM", "HEPBIGM" in the  last 72 hours.   IMPRESSION:  #19 78 year old white female with acute GI bleed in setting of Eliquis, with onset of dark red blood this morning per rectum with 2-3 episodes at home and associated dizziness/lightheadedness.  Patient just underwent an endoscopic mucosal resection of a very large duodenal multilobulated polyp located in the second portion of the duodenum on 07/11/2023 per Abbie Sons. Follow-up 50 to 60% of this lesion was able to be resected Path shows focal high-grade dysplasia but no invasive adenocarcinoma  Was resumed on Eliquis 4 days postprocedure.  CT angio this afternoon does show active extravasation as well as abnormal mural thickening involving the second and third portion of the duodenum  Hemoglobin down to 6.8 from baseline of 10.8 a few days ago.  Fortunately she has already been given Kcentra, and is being transfused 1 unit of packed RBCs. He is currently hemodynamically stable and has not had any further bowel movements over the past 8 hours  Certainly she is bleeding from the polyp  resection site in the second portion of the duodenum.  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 Jamse Mead in 2019  #3 atrial fibrillation-on Eliquis #4 history of chronic anemia/iron deficiency #5 chronic kidney disease #6 rheumatoid arthritis #7 congestive heart failure  PLAN: Sips of clears this evening, n.p.o. after midnight Transfuse 1 additional unit of packed RBCs now, then serial hemoglobins every 6 hours and transfuse as needed to keep her hemoglobin 7.5-8 Patient has been scheduled for EGD with Dr. Chales Abrahams tomorrow morning 07/21/2023.  Procedure was discussed in detail with the patient including indications risks and benefits and she is agreeable to proceed. She will need to remain off of Eliquis, we will communicate with Dr. Jamse Mead at [facility] and further discuss timing of resection of the remaining polyp as her path did not show  any invasive adenocarcinoma.  GI will follow with you  If she were to have further active bleeding this evening, then she would need emergent EGD by GI MD on-call.   Amy Esterwood  07/20/2023, 5:33 PM     Attending physician's note   I have taken history, reviewed the chart and examined the patient. I performed a substantive portion of this encounter, including complete performance of at least one of the key components, in conjunction with the APP. I agree with the Advanced Practitioner's note, impression and recommendations.   Post EMR bleeding on eliquis. Hb 10 to 6.8.INR 1.6,  + CTA. Dr Fredia Sorrow was kind enough to review films.  Advised against embolization for now.  EMR of large 5 cm multilobulated duodenal polyp in 2nd portion of duodenum by Dr. Jamse Mead 07/11/2023, 50-60% resected. Purastat was applied. Eliquis restarted 07/15/2023. Bx-intestinal type adenoma with multifocal high-grade dysplasia but no carcinoma.  Prior H/O ampullary adenoma s/p ampullectomy 2019  A-fib on Eliquis (last dose 1/22)  Plan: -Agree with Kcentra.  Hold Eliquis. -IV Protonix -Trend CBC.  Transfuse to keep hemoglobin around 8. -EGD tentatively in AM.  However, if active brisk bleeding, she needs to be transferred to ICU for emergent EGD.  Dr. Marina Goodell on-call.  He is aware.   Edman Circle, MD [facility] [phone]

## 2023-07-20 NOTE — ED Triage Notes (Signed)
Dark red blood in stools since this AM with dizziness. On Eliquis.   EMS VS: BP 88/60 HR 86 97% on RA

## 2023-07-20 NOTE — ED Provider Notes (Addendum)
[place] EMERGENCY DEPARTMENT AT [facility] Provider Note  CSN: [id number] Arrival date & time: 07/20/23 1148  Chief Complaint(s) GI Bleeding  HPI Peggy Armstrong is a 78 y.o. female here today for bloody diarrhea.  Patient on Eliquis.  She has a history of a duodenal adenoma, had an endoscopy performed 1 week ago.  She says that this morning she woke up, felt nauseated, had diarrhea that she described as having dark red blood.  She has not vomited.   Past Medical History Past Medical History:  Diagnosis Date   Acquired hypothyroidism    Atrial fibrillation (HCC)    Back injury    DVT (deep venous thrombosis) (HCC)    right DVT   GAD (generalized anxiety disorder)    GERD (gastroesophageal reflux disease)    Gout    Gout    Hypertension    RA (rheumatoid arthritis) (HCC)    Patient Active Problem List   Diagnosis Date Noted   Acute low back pain 07/18/2023   Upper GI bleed 06/20/2023   Long term current use of anticoagulant therapy 06/20/2023   Occult blood in stools 05/16/2023   Gastric polyps 05/16/2023   Generalized weakness 05/12/2023   Acute cystitis 05/12/2023   Dehydration 05/12/2023   Acute prerenal azotemia 05/12/2023   Paroxysmal atrial fibrillation (HCC) 05/12/2023   AAA (abdominal aortic aneurysm) (HCC) 05/12/2023   Hyperlipidemia 05/12/2023   GAD (generalized anxiety disorder) 05/12/2023   Acquired hypothyroidism 05/12/2023   Closed compression fracture of L2 lumbar vertebra, initial encounter (HCC) 05/11/2023   Chronic diastolic CHF (congestive heart failure) (HCC) 11/08/2019   Morbid obesity (HCC) 11/08/2019   History of DVT (deep vein thrombosis) 11/08/2019   Right shoulder pain 07/26/2018   Duodenal ulcer    Hematemesis 11/05/2017   Macrocytic anemia 11/05/2017   Melena 11/05/2017   Nausea & vomiting    Hypokalemia 07/03/2017   Essential hypertension    Gout    Clotting disorder (HCC)    GERD (gastroesophageal reflux disease)     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 Authorizing Provider  acetaminophen (TYLENOL) 325 MG tablet Take 650 mg by mouth every 6 (six) hours as needed for mild pain (pain score 1-3) or moderate pain (pain score 4-6).   Yes [provider]  allopurinol (ZYLOPRIM) 300 MG tablet Take 1 tablet (300 mg total) by mouth daily. 07/18/23  Yes Rothfuss, Teryl Lucy, PA-C  amiodarone (PACERONE) 200 MG tablet Take 1 tablet (200 mg total) by mouth daily. Please call [phone] to schedule cardiology follow up for further refills. 06/22/23  Yes Alver Sorrow, NP  Ascorbic Acid (VITAMIN C CR) 500 MG TBCR Take 500 mg by mouth daily.   Yes [provider]  atorvastatin (LIPITOR) 20 MG tablet Take 1 tablet (20 mg total) by mouth daily. 12/19/22  Yes Alver Sorrow, NP  busPIRone (BUSPAR) 10 MG tablet Take 1 tablet (10 mg total) by mouth 2 (two) times daily. 07/18/23  Yes Rothfuss, Gerilyn Pilgrim  T, PA-C  cholecalciferol (VITAMIN D) 1000 units tablet Take 1,000 Units by mouth daily.   Yes [provider]  cyanocobalamin (VITAMIN B12) 1000 MCG tablet Take 1 tablet by mouth daily.   Yes [provider]  ferrous sulfate 325 (65 FE) MG tablet Take 325 mg by mouth daily with breakfast.   Yes [provider]  folic acid (FOLVITE) 1 MG tablet Take 1 tablet (1 mg total) by mouth daily. 05/18/23  Yes Osvaldo Shipper, MD  furosemide (LASIX) 20 MG tablet Take 1 tablet (20 mg total) by mouth daily. 07/18/23  Yes  Rothfuss, Teryl Lucy, PA-C  KLOR-CON M20 20 MEQ tablet Take 20 mEq by mouth daily.   Yes [provider]  levothyroxine (SYNTHROID) 100 MCG tablet Take 1 tablet (100 mcg total) by mouth daily before breakfast. 07/18/23  Yes Rothfuss, Gerilyn Pilgrim T, PA-C  melatonin 3 MG TABS tablet Take 1 tablet (3 mg total) by mouth at bedtime as needed (insomnia). 05/18/23  Yes Osvaldo Shipper, MD  ondansetron (ZOFRAN) 4 MG tablet Take 4 mg by mouth every 6 (six) hours as needed for nausea or vomiting.   Yes [provider]  oxyCODONE (OXY IR/ROXICODONE) 5 MG immediate release tablet Take 5 mg by mouth as needed for severe pain (pain score 7-10).   Yes [provider]  pantoprazole (PROTONIX) 40 MG tablet Take 1 tablet (40 mg total) by mouth daily. 07/18/23  Yes Rothfuss, Teryl Lucy, PA-C  sertraline (ZOLOFT) 25 MG tablet Take 1 tablet (25 mg total) by mouth daily. 07/18/23  Yes Rothfuss, Teryl Lucy, PA-C  tiZANidine (ZANAFLEX) 4 MG tablet Take 1 tablet (4 mg total) by mouth every 6 (six) hours as needed for muscle spasms. 07/18/23  Yes Rothfuss, Teryl Lucy, PA-C  ELIQUIS 5 MG TABS tablet Take 5 mg by mouth 2 (two) times daily. Patient not taking: Reported on 07/20/2023 07/02/23   [provider]                                                                                                                                    Past Surgical History Past Surgical History:  Procedure Laterality Date   ABDOMINAL HYSTERECTOMY     BIOPSY  05/16/2023   Procedure: BIOPSY;  Surgeon: Meryl Dare, MD;  Location: [facility];  Service: Gastroenterology;;   CHOLECYSTECTOMY     ENDOSCOPIC RETROGRADE CHOLANGIOPANCREATOGRAPHY (ERCP) WITH PROPOFOL N/A 06/15/2017   Procedure: ENDOSCOPIC RETROGRADE CHOLANGIOPANCREATOGRAPHY (ERCP) WITH PROPOFOL;  Surgeon: Rachael Fee, MD;  Location: [facility];  Service: Endoscopy;  Laterality: N/A;   ESOPHAGOGASTRODUODENOSCOPY (EGD) WITH PROPOFOL N/A 03/24/2017   Procedure:  ESOPHAGOGASTRODUODENOSCOPY (EGD) WITH PROPOFOL;  Surgeon: Sherrilyn Rist, MD;  Location: [facility];  Service: Gastroenterology;  Laterality: N/A;   ESOPHAGOGASTRODUODENOSCOPY (EGD) WITH PROPOFOL N/A 11/07/2017   Procedure: ESOPHAGOGASTRODUODENOSCOPY (EGD) WITH PROPOFOL;  Surgeon: Rachael Fee, MD;  Location: [facility];  Service: Endoscopy;  Laterality: N/A;   ESOPHAGOGASTRODUODENOSCOPY (EGD) WITH PROPOFOL N/A 05/16/2023   Procedure: ESOPHAGOGASTRODUODENOSCOPY (EGD) WITH PROPOFOL;  Surgeon: Meryl Dare, MD;  Location: [facility];  Service: Gastroenterology;  Laterality: N/A;   EUS N/A 04/06/2017   Procedure: UPPER ENDOSCOPIC ULTRASOUND (EUS) LINEAR;  Surgeon: Rachael Fee, MD;  Location: [facility];  Service: Endoscopy;  Laterality: N/A;  to evaluate duodenal mass   HERNIA REPAIR     IR BILIARY DRAIN PLACEMENT WITH CHOLANGIOGRAM  03/25/2017   IR CONVERT BILIARY DRAIN TO INT EXT BILIARY DRAIN  04/03/2017   POLYPECTOMY  05/16/2023   Procedure: POLYPECTOMY;  Surgeon: Meryl Dare, MD;  Location: [facility];  Service: Gastroenterology;;   Family History Family History  Problem Relation Age of Onset   Hypertension Mother    Prostate cancer Father    Colon cancer Father    Hypertension Brother    Rheum arthritis Maternal Grandmother    Diabetes Paternal Grandmother    Heart disease Paternal Grandfather     Social History Social History   Tobacco Use   Smoking status: Former    Types: Cigarettes    Passive exposure: Past   Smokeless tobacco: Former   Tobacco comments:    Quit in 2012. Started at 20's. Cannot recall how many.   Vaping Use   Vaping status: Never Used  Substance Use Topics   Alcohol use: No   Drug use: No   Allergies Codeine and Tape  Review of Systems Review of Systems  Physical Exam Vital Signs  I have reviewed the triage vital signs BP 96/62   Pulse 76   Temp 97.8 F (36.6 C) (Oral)   Resp (!) 26   SpO2 99%   Physical  Exam Vitals reviewed.  Pulmonary:     Effort: Pulmonary effort is normal.  Abdominal:     General: Abdomen is flat. There is no distension.     Palpations: Abdomen is soft.     Tenderness: There is no abdominal tenderness. There is no guarding.  Genitourinary:    Comments: Nurse chaperone present.  Dark blood and digital rectal exam.  No brisk bleeding appreciated Musculoskeletal:     Cervical back: Normal range of motion.  Neurological:     Mental Status: She is alert.     ED Results and Treatments Labs (all labs ordered are listed, but only abnormal results are displayed) Labs Reviewed  COMPREHENSIVE METABOLIC PANEL - Abnormal; Notable for the following components:      Result Value   Glucose, Bld 147 (*)    BUN 26 (*)    Creatinine, Ser 1.09 (*)    Calcium 7.8 (*)    Total Protein 4.7 (*)    Albumin 2.4 (*)    AST 42 (*)    GFR, Estimated 52 (*)    All other components within normal limits  CBC WITH DIFFERENTIAL/PLATELET - Abnormal; Notable for the following components:   RBC 2.61 (*)    Hemoglobin 7.8 (*)    HCT 26.5 (*)    MCV 101.5 (*)    MCHC 29.4 (*)    Abs Immature Granulocytes 0.10 (*)    All other components within normal limits  PROTIME-INR - Abnormal; Notable for the following components:   Prothrombin Time 19.5 (*)    INR 1.6 (*)    All other components within normal limits  HEMOGLOBIN AND HEMATOCRIT, BLOOD - Abnormal; Notable for the following components:   Hemoglobin 6.8 (*)    HCT 22.7 (*)  All other components within normal limits  TYPE AND SCREEN  PREPARE RBC (CROSSMATCH)                                                                                                                          Radiology No results found.  Pertinent labs & imaging results that were available during my care of the patient were reviewed by me and considered in my medical decision making (see MDM for details).  Medications Ordered in ED Medications  0.9 %   sodium chloride infusion (Manually program via Guardrails IV Fluids) (has no administration in time range)  prothrombin complex conc human (KCENTRA) IVPB 4,310 Units (has no administration in time range)  lactated ringers bolus 1,000 mL (0 mLs Intravenous Stopped 07/20/23 1338)                                                                                                                                     Procedures .Critical Care  Performed by: Arletha Pili, DO Authorized by: Arletha Pili, DO   Critical care provider statement:    Critical care time (minutes):  30   Critical care was necessary to treat or prevent imminent or life-threatening deterioration of the following conditions:  Shock   Critical care was time spent personally by me on the following activities:  Development of treatment plan with patient or surrogate, discussions with consultants, evaluation of patient's response to treatment, examination of patient, ordering and review of laboratory studies, ordering and review of radiographic studies, ordering and performing treatments and interventions, pulse oximetry, re-evaluation of patient's condition and review of old charts   (including critical care time)  Medical Decision Making / ED Course   This patient presents to the ED for concern of lower GI bleed, this involves an extensive number of treatment options, and is a complaint that carries with it a high risk of complications and morbidity.  The differential diagnosis includes chronic lower GI bleed, duodenal adenoma, diverticular bleed, less likely internal hemorrhoids, less likely upper GI bleed.  MDM: Patient's blood pressures on the softer side.  Will start with fluids we will get an H&H on her.  She does not have any active bleeding at this time.  Will obtain CT angiography of the patient's abdomen and pelvis.  Type and screen ordered.  Considered reversing the patient's Eliquis, however on exam the patient  overall looks well, good pulses, no warm extremities, mentating well.   Reassessment 2:30 PM-patient's hemoglobin 7.8.  Have ordered a repeat H&H to see if we have a drop.  Patient still pending CT abdomen pelvis.  Patient's blood pressure stable.  Overall continues to look well.    Repeat H&H showed hemoglobin of 6.8.  Will reverse patient.  Will transfuse patient.  Patient will be signed out to Dr. Jarold Motto for CT imaging and admission.  Additional history obtained: -Additional history obtained from daughter and granddaughter at bedside -External records from outside source obtained and reviewed including: Chart review including previous notes, labs, imaging, consultation notes   Lab Tests: -I ordered, reviewed, and interpreted labs.   The pertinent results include:   Labs Reviewed  COMPREHENSIVE METABOLIC PANEL - Abnormal; Notable for the following components:      Result Value   Glucose, Bld 147 (*)    BUN 26 (*)    Creatinine, Ser 1.09 (*)    Calcium 7.8 (*)    Total Protein 4.7 (*)    Albumin 2.4 (*)    AST 42 (*)    GFR, Estimated 52 (*)    All other components within normal limits  CBC WITH DIFFERENTIAL/PLATELET - Abnormal; Notable for the following components:   RBC 2.61 (*)    Hemoglobin 7.8 (*)    HCT 26.5 (*)    MCV 101.5 (*)    MCHC 29.4 (*)    Abs Immature Granulocytes 0.10 (*)    All other components within normal limits  PROTIME-INR - Abnormal; Notable for the following components:   Prothrombin Time 19.5 (*)    INR 1.6 (*)    All other components within normal limits  HEMOGLOBIN AND HEMATOCRIT, BLOOD - Abnormal; Notable for the following components:   Hemoglobin 6.8 (*)    HCT 22.7 (*)    All other components within normal limits  TYPE AND SCREEN  PREPARE RBC (CROSSMATCH)      EKG   EKG Interpretation Date/Time:    Ventricular Rate:    PR Interval:    QRS Duration:    QT Interval:    QTC Calculation:   R Axis:      Text Interpretation:            Imaging Studies ordered: I ordered imaging studies including CT imaging of the abdomen pelvis I independently visualized and interpreted imaging. I agree with the radiologist interpretation   Medicines ordered and prescription drug management: Meds ordered this encounter  Medications   lactated ringers bolus 1,000 mL   0.9 %  sodium chloride infusion (Manually program via Guardrails IV Fluids)   prothrombin complex conc human (KCENTRA) IVPB 4,310 Units    Administer at a rate of 3 units/kg/minute up to a maximum rate of 8.4 mL/min (~210 units/minute). Consider changing administration time to weight-based on a case-by-case basis (ie life-threatening bleed or lack of IV access sites).    -I have reviewed the patients home medicines and have made adjustments as needed   Cardiac Monitoring: The patient was maintained on a cardiac monitor.  I personally viewed and interpreted the cardiac monitored which showed an underlying rhythm of: Normal sinus rhythm  Social Determinants of Health:  Factors impacting patients care include: Assisted living resident   Reevaluation: After the interventions noted above, I reevaluated the patient and found that they have :improved  Co morbidities that complicate the patient evaluation  Past Medical History:  Diagnosis Date   Acquired hypothyroidism  Atrial fibrillation (HCC)    Back injury    DVT (deep venous thrombosis) (HCC)    right DVT   GAD (generalized anxiety disorder)    GERD (gastroesophageal reflux disease)    Gout    Gout    Hypertension    RA (rheumatoid arthritis) (HCC)       Dispostion: Patient was signed out to Dr. Jarold Motto pending labs, admission     Final Clinical Impression(s) / ED Diagnoses Final diagnoses:  Acute GI bleeding     @PCDICTATION @    Anders Simmonds T, DO 07/20/23 1436    Anders Simmonds T, DO 07/20/23 1448

## 2023-07-20 NOTE — ED Notes (Signed)
Assumed pt care from [facility]

## 2023-07-21 ENCOUNTER — Encounter (HOSPITAL_COMMUNITY)
Admission: EM | Disposition: A | Discharge status: Home Health | Payer: Self-pay | Source: Home / Self Care | Attending: Family Medicine

## 2023-07-21 ENCOUNTER — Inpatient Hospital Stay (HOSPITAL_COMMUNITY): Payer: 59 | Admitting: Certified Registered"

## 2023-07-21 DIAGNOSIS — D689 Coagulation defect, unspecified: Secondary | ICD-10-CM | POA: Diagnosis present

## 2023-07-21 DIAGNOSIS — E039 Hypothyroidism, unspecified: Secondary | ICD-10-CM | POA: Diagnosis present

## 2023-07-21 DIAGNOSIS — K449 Diaphragmatic hernia without obstruction or gangrene: Secondary | ICD-10-CM

## 2023-07-21 DIAGNOSIS — D62 Acute posthemorrhagic anemia: Secondary | ICD-10-CM

## 2023-07-21 DIAGNOSIS — C170 Malignant neoplasm of duodenum: Secondary | ICD-10-CM | POA: Diagnosis not present

## 2023-07-21 DIAGNOSIS — E876 Hypokalemia: Secondary | ICD-10-CM | POA: Diagnosis present

## 2023-07-21 DIAGNOSIS — R571 Hypovolemic shock: Secondary | ICD-10-CM | POA: Diagnosis present

## 2023-07-21 DIAGNOSIS — G4700 Insomnia, unspecified: Secondary | ICD-10-CM | POA: Diagnosis present

## 2023-07-21 DIAGNOSIS — K921 Melena: Secondary | ICD-10-CM | POA: Diagnosis not present

## 2023-07-21 DIAGNOSIS — Z8249 Family history of ischemic heart disease and other diseases of the circulatory system: Secondary | ICD-10-CM | POA: Diagnosis not present

## 2023-07-21 DIAGNOSIS — E871 Hypo-osmolality and hyponatremia: Secondary | ICD-10-CM | POA: Diagnosis present

## 2023-07-21 DIAGNOSIS — K260 Acute duodenal ulcer with hemorrhage: Secondary | ICD-10-CM | POA: Diagnosis present

## 2023-07-21 DIAGNOSIS — K922 Gastrointestinal hemorrhage, unspecified: Principal | ICD-10-CM

## 2023-07-21 DIAGNOSIS — K317 Polyp of stomach and duodenum: Secondary | ICD-10-CM | POA: Diagnosis not present

## 2023-07-21 DIAGNOSIS — N1832 Chronic kidney disease, stage 3b: Secondary | ICD-10-CM | POA: Diagnosis present

## 2023-07-21 DIAGNOSIS — D638 Anemia in other chronic diseases classified elsewhere: Secondary | ICD-10-CM | POA: Diagnosis present

## 2023-07-21 DIAGNOSIS — E785 Hyperlipidemia, unspecified: Secondary | ICD-10-CM | POA: Diagnosis present

## 2023-07-21 DIAGNOSIS — K264 Chronic or unspecified duodenal ulcer with hemorrhage: Secondary | ICD-10-CM | POA: Diagnosis not present

## 2023-07-21 DIAGNOSIS — I5032 Chronic diastolic (congestive) heart failure: Secondary | ICD-10-CM | POA: Diagnosis present

## 2023-07-21 DIAGNOSIS — R578 Other shock: Secondary | ICD-10-CM | POA: Diagnosis present

## 2023-07-21 DIAGNOSIS — E669 Obesity, unspecified: Secondary | ICD-10-CM | POA: Diagnosis present

## 2023-07-21 DIAGNOSIS — F32A Depression, unspecified: Secondary | ICD-10-CM | POA: Diagnosis present

## 2023-07-21 DIAGNOSIS — Z79890 Hormone replacement therapy (postmenopausal): Secondary | ICD-10-CM | POA: Diagnosis not present

## 2023-07-21 DIAGNOSIS — Z7901 Long term (current) use of anticoagulants: Secondary | ICD-10-CM | POA: Diagnosis not present

## 2023-07-21 DIAGNOSIS — N179 Acute kidney failure, unspecified: Secondary | ICD-10-CM

## 2023-07-21 DIAGNOSIS — M069 Rheumatoid arthritis, unspecified: Secondary | ICD-10-CM | POA: Diagnosis present

## 2023-07-21 DIAGNOSIS — I4891 Unspecified atrial fibrillation: Secondary | ICD-10-CM

## 2023-07-21 DIAGNOSIS — I130 Hypertensive heart and chronic kidney disease with heart failure and stage 1 through stage 4 chronic kidney disease, or unspecified chronic kidney disease: Secondary | ICD-10-CM | POA: Diagnosis present

## 2023-07-21 DIAGNOSIS — I482 Chronic atrial fibrillation, unspecified: Secondary | ICD-10-CM | POA: Diagnosis present

## 2023-07-21 DIAGNOSIS — Z833 Family history of diabetes mellitus: Secondary | ICD-10-CM | POA: Diagnosis not present

## 2023-07-21 HISTORY — PX: HEMOSTASIS CONTROL: SHX6838

## 2023-07-21 HISTORY — DX: Gastrointestinal hemorrhage, unspecified: K92.2

## 2023-07-21 HISTORY — PX: ESOPHAGOGASTRODUODENOSCOPY (EGD) WITH PROPOFOL: SHX5813

## 2023-07-21 HISTORY — DX: Other shock: R57.8

## 2023-07-21 HISTORY — PX: HEMOSTASIS CLIP PLACEMENT: SHX6857

## 2023-07-21 HISTORY — DX: Acute posthemorrhagic anemia: D62

## 2023-07-21 HISTORY — DX: Acute kidney failure, unspecified: N17.9

## 2023-07-21 LAB — COMPREHENSIVE METABOLIC PANEL
ALT: 22 U/L (ref 0–44)
AST: 29 U/L (ref 15–41)
Albumin: 2 g/dL — ABNORMAL LOW (ref 3.5–5.0)
Alkaline Phosphatase: 65 U/L (ref 38–126)
Anion gap: 9 (ref 5–15)
BUN: 40 mg/dL — ABNORMAL HIGH (ref 8–23)
CO2: 23 mmol/L (ref 22–32)
Calcium: 7.7 mg/dL — ABNORMAL LOW (ref 8.9–10.3)
Chloride: 105 mmol/L (ref 98–111)
Creatinine, Ser: 1.4 mg/dL — ABNORMAL HIGH (ref 0.44–1.00)
GFR, Estimated: 39 mL/min — ABNORMAL LOW (ref >60)
Glucose, Bld: 114 mg/dL — ABNORMAL HIGH (ref 70–99)
Potassium: 5.1 mmol/L (ref 3.5–5.1)
Sodium: 137 mmol/L (ref 135–145)
Total Bilirubin: 1.3 mg/dL — ABNORMAL HIGH (ref 0.0–1.2)
Total Protein: 3.8 g/dL — ABNORMAL LOW (ref 6.5–8.1)

## 2023-07-21 LAB — HEMOGLOBIN AND HEMATOCRIT, BLOOD
HCT: 29.1 % — ABNORMAL LOW (ref 36.0–46.0)
HCT: 26.9 % — ABNORMAL LOW (ref 36.0–46.0)
HCT: 29.5 % — ABNORMAL LOW (ref 36.0–46.0)
Hemoglobin: 9.9 g/dL — ABNORMAL LOW (ref 12.0–15.0)
Hemoglobin: 9.1 g/dL — ABNORMAL LOW (ref 12.0–15.0)
Hemoglobin: 9.8 g/dL — ABNORMAL LOW (ref 12.0–15.0)

## 2023-07-21 LAB — MAGNESIUM: Magnesium: 1.2 mg/dL — ABNORMAL LOW (ref 1.7–2.4)

## 2023-07-21 LAB — CBC
HCT: 30.2 % — ABNORMAL LOW (ref 36.0–46.0)
Hemoglobin: 10.3 g/dL — ABNORMAL LOW (ref 12.0–15.0)
MCH: 29.7 pg (ref 26.0–34.0)
MCHC: 34.1 g/dL (ref 30.0–36.0)
MCV: 87 fL (ref 80.0–100.0)
Platelets: 99 10*3/uL — ABNORMAL LOW (ref 150–400)
RBC: 3.47 MIL/uL — ABNORMAL LOW (ref 3.87–5.11)
RDW: 16.1 % — ABNORMAL HIGH (ref 11.5–15.5)
WBC: 10.8 10*3/uL — ABNORMAL HIGH (ref 4.0–10.5)
nRBC: 0 % (ref 0.0–0.2)

## 2023-07-21 LAB — MRSA NEXT GEN BY PCR, NASAL: MRSA by PCR Next Gen: NOT DETECTED

## 2023-07-21 LAB — GLUCOSE, CAPILLARY
Glucose-Capillary: 110 mg/dL — ABNORMAL HIGH (ref 70–99)
Glucose-Capillary: 115 mg/dL — ABNORMAL HIGH (ref 70–99)
Glucose-Capillary: 111 mg/dL — ABNORMAL HIGH (ref 70–99)
Glucose-Capillary: 113 mg/dL — ABNORMAL HIGH (ref 70–99)
Glucose-Capillary: 117 mg/dL — ABNORMAL HIGH (ref 70–99)
Glucose-Capillary: 107 mg/dL — ABNORMAL HIGH (ref 70–99)

## 2023-07-21 LAB — PROTIME-INR
INR: 1.2 (ref 0.8–1.2)
Prothrombin Time: 15.7 s — ABNORMAL HIGH (ref 11.4–15.2)

## 2023-07-21 LAB — PREPARE RBC (CROSSMATCH)

## 2023-07-21 LAB — LACTIC ACID, PLASMA: Lactic Acid, Venous: 1.4 mmol/L (ref 0.5–1.9)

## 2023-07-21 SURGERY — ESOPHAGOGASTRODUODENOSCOPY (EGD) WITH PROPOFOL
Anesthesia: General

## 2023-07-21 MED ORDER — MAGNESIUM SULFATE 4 GM/100ML IV SOLN
4.0000 g | Freq: Once | INTRAVENOUS | Status: AC
  Administered 2023-07-21: 4 g via INTRAVENOUS
  Filled 2023-07-21: qty 100

## 2023-07-21 MED ORDER — SUCCINYLCHOLINE CHLORIDE 200 MG/10ML IV SOSY
PREFILLED_SYRINGE | INTRAVENOUS | Status: DC | PRN
  Administered 2023-07-21: 100 mg via INTRAVENOUS

## 2023-07-21 MED ORDER — SODIUM CHLORIDE 0.9 % IV BOLUS
500.0000 mL | Freq: Once | INTRAVENOUS | Status: AC
Start: 2023-07-21 — End: 2023-07-21
  Administered 2023-07-21: 500 mL via INTRAVENOUS

## 2023-07-21 MED ORDER — MELATONIN 3 MG PO TABS
3.0000 mg | ORAL_TABLET | Freq: Every evening | ORAL | Status: AC | PRN
  Administered 2023-07-21 – 2023-07-23 (×3): 3 mg via ORAL
  Filled 2023-07-21 (×3): qty 1

## 2023-07-21 MED ORDER — CALCIUM GLUCONATE-NACL 2-0.675 GM/100ML-% IV SOLN
2.0000 g | Freq: Once | INTRAVENOUS | Status: AC
  Administered 2023-07-21: 2000 mg via INTRAVENOUS
  Filled 2023-07-21: qty 100

## 2023-07-21 MED ORDER — LIDOCAINE 2% (20 MG/ML) 5 ML SYRINGE
INTRAMUSCULAR | Status: DC | PRN
  Administered 2023-07-21: 70 mg via INTRAVENOUS

## 2023-07-21 MED ORDER — PROTHROMBIN COMPLEX CONC HUMAN 500 UNITS IV KIT
2159.0000 [IU] | PACK | Status: AC
  Administered 2023-07-21: 2159 [IU] via INTRAVENOUS
  Filled 2023-07-21: qty 2159

## 2023-07-21 MED ORDER — ORAL CARE MOUTH RINSE: 15.0000 mL | OROMUCOSAL | Status: DC | PRN

## 2023-07-21 MED ORDER — PHENYLEPHRINE 80 MCG/ML (10ML) SYRINGE FOR IV PUSH (FOR BLOOD PRESSURE SUPPORT)
PREFILLED_SYRINGE | INTRAVENOUS | Status: DC | PRN
  Administered 2023-07-21 (×8): 80 ug via INTRAVENOUS

## 2023-07-21 MED ORDER — SODIUM ZIRCONIUM CYCLOSILICATE 10 G PO PACK
10.0000 g | PACK | Freq: Once | ORAL | Status: AC
  Administered 2023-07-21: 10 g via ORAL
  Filled 2023-07-21: qty 1

## 2023-07-21 MED ORDER — BUSPIRONE HCL 5 MG PO TABS
10.0000 mg | ORAL_TABLET | Freq: Two times a day (BID) | ORAL | Status: DC
  Administered 2023-07-21 – 2023-07-27 (×11): 10 mg via ORAL
  Filled 2023-07-21 (×12): qty 2

## 2023-07-21 MED ORDER — SODIUM CHLORIDE 0.9% IV SOLUTION: Freq: Once | INTRAVENOUS | Status: DC

## 2023-07-21 MED ORDER — TIZANIDINE HCL 4 MG PO TABS
4.0000 mg | ORAL_TABLET | Freq: Three times a day (TID) | ORAL | Status: DC | PRN
  Administered 2023-07-21: 4 mg via ORAL
  Filled 2023-07-21 (×2): qty 1

## 2023-07-21 MED ORDER — LACTATED RINGERS IV BOLUS
500.0000 mL | Freq: Once | INTRAVENOUS | Status: AC
  Administered 2023-07-21: 500 mL via INTRAVENOUS

## 2023-07-21 MED ORDER — DEXAMETHASONE SODIUM PHOSPHATE 10 MG/ML IJ SOLN
INTRAMUSCULAR | Status: DC | PRN
  Administered 2023-07-21: 4 mg via INTRAVENOUS

## 2023-07-21 MED ORDER — PROPOFOL 10 MG/ML IV BOLUS
INTRAVENOUS | Status: DC | PRN
  Administered 2023-07-21: 140 mg via INTRAVENOUS
  Administered 2023-07-21: 40 mg via INTRAVENOUS
  Administered 2023-07-21: 10 mg via INTRAVENOUS

## 2023-07-21 MED ORDER — CHLORHEXIDINE GLUCONATE CLOTH 2 % EX PADS
6.0000 | MEDICATED_PAD | Freq: Every day | CUTANEOUS | Status: DC
  Administered 2023-07-21 – 2023-07-27 (×6): 6 via TOPICAL

## 2023-07-21 SURGICAL SUPPLY — 14 items

## 2023-07-21 NOTE — ED Notes (Addendum)
Dr Marina Goodell made aware of patient having 5 large red/maroon stools in the last 45 min. Pt had not had a BM since the morning prior to coming in to hospital. Pt VS are stable at this time but is still continuing to have red stools. I notified Dr Marina Goodell that hospitalist ordered 1 unit of PRBC, he stated to ask them to order 3 units PRBC total and to have the hospitalist check if the patient needed any more doses of kcentra. Also he is requesting that pt be admitted to the ICU. Whitney charge RN has been made aware at this time

## 2023-07-21 NOTE — Transfer of Care (Signed)
Immediate Anesthesia Transfer of Care Note  Patient: Peggy Armstrong  Procedure(s) Performed: ESOPHAGOGASTRODUODENOSCOPY (EGD) WITH PROPOFOL HEMOSTASIS CLIP PLACEMENT HEMOSTASIS CONTROL  Patient Location: PACU  Anesthesia Type:General  Level of Consciousness: awake, alert , oriented, drowsy, and patient cooperative  Airway & Oxygen Therapy: Patient Spontanous Breathing and Patient connected to face mask oxygen  Post-op Assessment: Report given to RN and Post -op Vital signs reviewed and stable  Post vital signs: Reviewed and stable  Last Vitals:  Vitals Value Taken Time  BP 94/51 07/21/23 1037  Temp    Pulse 78 07/21/23 1040  Resp 24 07/21/23 1040  SpO2 99 % 07/21/23 1040  Vitals shown include unfiled device data.  Last Pain:  Vitals:   07/21/23 1037  TempSrc:   PainSc: Asleep      Patients Stated Pain Goal: 0 (07/21/23 0800)  Complications: No notable events documented.

## 2023-07-21 NOTE — Consult Note (Signed)
NAME:  Peggy Armstrong, MRN:  [id number], DOB:  09-Sep-1945, LOS: 0 ADMISSION DATE:  07/20/2023, CONSULTATION DATE:  07/21/23 REFERRING MD:  Arlean Hopping , CHIEF COMPLAINT:  UGIB   History of Present Illness:  78 yo F PMH HTN HLD diastolic HF, hx DVT, Afib on eliquis and recurrent GIB, who presented to ED 07/20/23 with bloody diarrhea beginning morning of presentation. 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 Eliquis resumed 4d later.  In ED she initially had borderline soft pressures and was given IVF while awaiting labs and CT angio. H/H 7.8, then 6.8 (with baseline around 11) and was transfused 1 PRBC and given Kcentra. CT anio with active bleeding within second and third portions of duodenum. GI and IR were engaged. Felt too proximal for IR intervention. Seen by GI. At that time she was not having active bloody BM and it was rec for 1 additional PRBC and EGD 1/24 AM with Dr. Chales Abrahams.   1/23-24 night, the patient starting having frequent dark bloody bowel movements. Hgb 7.8 (after 2 PRBC) She became hypotensive and was transfused 4 PRBC (5th pending) and given a second dose of Kcentra. TRH updated GI, and consulted PCCM for transfer to ICU.    Pertinent  Medical History  GIB Afib on eliquis DVT dHF HTN HLD  Significant Hospital Events: Including procedures, antibiotic start and stop dates in addition to other pertinent events   1/14 incomplete mucosal resection of duodenal adenoma at [facility]. 1/18 eliquis resumed 1/23 to ED w dark bloody BM, CT angio with active extrav. admit to [facility]. Kcentra and 2 PRBC. GI consult 1/24 incr output. Hypotensive. 2nd dose K Centra. Additional 5 PRBC. PCCM consult   Interim History / Subjective:  Has received 2nd dose of K centra and 4 of 5 ordered PRBC  Nursing have been working on her constantly the past several hours and it sounds like this is better than she had been a little while ago   Objective    Blood pressure (!) 124/106, pulse 84, temperature 97.9 F (36.6 C), temperature source Oral, resp. rate (!) 26, SpO2 98%.        Intake/Output Summary (Last 24 hours) at 07/21/2023 0141 Last data filed at 07/20/2023 2147 Gross per 24 hour  Intake 1021.67 ml  Output --  Net 1021.67 ml   There were no vitals filed for this visit.  Examination: General: Chronically and acutely ill F, very uncomfortable appearing   HENT: NCAT pink mm  Lungs: symmetrical chest expansion  Cardiovascular: rr cap refill is 3 seconds  Abdomen: Round soft. Dark maroon BM   Extremities: No acute joint deformity Skin: pale. Scattered ecchymosis  Neuro: Awake, a little lethargic. 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 Did not have appropriate response, f/u hgb 7.8 Subsequently started having frequent dark stools, soft Bps. 2nd dose Kcentra given and total of 5 PRBCs were ordered (4 of 5 have completed at time of consultation)  -- in total this will be 7 PRBC  P -will admit to ICU -has 3x 20g PIV which is great from resuscitation standpoint  -  complete ordered PRBC, f/u H/H & trend q6hr -completing 2g Ca -PPI -NPO -on call GI has been updated by [facility] and ED RN re status change, it sounds like there are plans to scope more urgently than initially planned-- I discussed this with her and she became fairly upset and does not want to do anything procedurally until her daughter arrives. I empathized with this, but also talked to her about the possibility of procedure being needed prior to daughter's arrival depending on GI's assessment & her daughter's travel plans.   AKI -AM  BMP -follow UOP   Hx Afib Chronic anticoagulation -holding eliquis  -cardiac monitoring -optimize lytes  -amio when taking PO  Hx HTN HLD Hx diastolic HF -holding home meds in setting of above   Elevated LFTs, mild and improving -AM CMP   Hypothyroidism -resume synthroid when taking PO  Best Practice (right click and "Reselect all SmartList Selections" daily)   Diet/type: NPO DVT prophylaxis SCD Pressure ulcer(s): pressure ulcer assessment deferred . She became profoundly hypotensive the last time RN cleaned her up, I did not feel appropriate to turn her at this time Lines: N/A Foley:  N/A Code Status:  full code Last date of multidisciplinary goals of care discussion [1/24 ]  Labs   CBC: Recent Labs  Lab 07/18/23 1542 07/20/23 1213 07/20/23 1401 07/20/23 2305  WBC 4.9 8.8  --   --   NEUTROABS 3.8 7.1  --   --   HGB 10.4* 7.8* 6.8* 7.8*  HCT 34.2 26.5* 22.7* 24.4*  MCV 97 101.5*  --   --   PLT 116* 150  --   --     Basic Metabolic Panel: Recent Labs  Lab 07/18/23 1542 07/20/23 1213  NA 141 140  K 4.4 4.2  CL 102 105  CO2 23 23  GLUCOSE 96 147*  BUN 18 26*  CREATININE 0.93 1.09*  CALCIUM 8.5* 7.8*   GFR: Estimated Creatinine Clearance: 42.2 mL/min (A) (by C-G formula based on SCr of 1.09 mg/dL (H)). Recent Labs  Lab 07/18/23 1542 07/20/23 1213  WBC 4.9 8.8    Liver Function Tests: Recent Labs  Lab 07/18/23 1542 07/20/23 1213  AST 48* 42*  ALT 30 26  ALKPHOS 160* 99  BILITOT 0.3 0.6  PROT 5.7* 4.7*  ALBUMIN 3.3* 2.4*   No results for input(s): "LIPASE", "AMYLASE" in the last 168 hours. No results for input(s): "AMMONIA" in the last 168 hours.  ABG No results found for: "PHART", "PCO2ART", "PO2ART", "HCO3", "TCO2", "ACIDBASEDEF", "O2SAT"   Coagulation Profile: Recent Labs  Lab 07/20/23 1213  INR 1.6*    Cardiac Enzymes: No results for input(s): "CKTOTAL", "CKMB", "CKMBINDEX", "TROPONINI" in the last 168 hours.  HbA1C: Hgb  A1c MFr Bld  Date/Time Value Ref Range Status  07/18/2023 03:42 PM 4.3 (L) 4.8 - 5.6 % Final    Comment:             Prediabetes: 5.7 - 6.4          Diabetes: >6.4          Glycemic control for adults with diabetes: <7.0     CBG: No results for input(s): "GLUCAP" in the last 168 hours.  Review of Systems:   Review of Systems  Constitutional:  Positive for malaise/fatigue.  HENT: Negative.    Respiratory: Negative.    Cardiovascular: Negative.   Gastrointestinal:  Positive for melena.  Genitourinary: Negative.   Musculoskeletal:  Positive for myalgias.  Neurological:  Positive for weakness.  Endo/Heme/Allergies:  Bruises/bleeds easily.     Past Medical History:  She,  has a past medical history of Acquired hypothyroidism, Atrial fibrillation (HCC), Back injury, DVT (deep venous thrombosis) (HCC), GAD (generalized anxiety disorder), GERD (gastroesophageal reflux disease), Gout, Gout, Hypertension, and RA (rheumatoid arthritis) (HCC).   Surgical History:   Past Surgical History:  Procedure Laterality Date   ABDOMINAL HYSTERECTOMY     BIOPSY  05/16/2023   Procedure: BIOPSY;  Surgeon: Meryl Dare, MD;  Location: [facility];  Service: Gastroenterology;;   CHOLECYSTECTOMY     ENDOSCOPIC RETROGRADE CHOLANGIOPANCREATOGRAPHY (ERCP) WITH PROPOFOL N/A 06/15/2017   Procedure: ENDOSCOPIC RETROGRADE CHOLANGIOPANCREATOGRAPHY (ERCP) WITH PROPOFOL;  Surgeon: Rachael Fee, MD;  Location: [facility];  Service: Endoscopy;  Laterality: N/A;   ESOPHAGOGASTRODUODENOSCOPY (EGD) WITH PROPOFOL N/A 03/24/2017   Procedure: ESOPHAGOGASTRODUODENOSCOPY (EGD) WITH PROPOFOL;  Surgeon: Sherrilyn Rist, MD;  Location: [facility];  Service: Gastroenterology;  Laterality: N/A;   ESOPHAGOGASTRODUODENOSCOPY (EGD) WITH PROPOFOL N/A 11/07/2017   Procedure: ESOPHAGOGASTRODUODENOSCOPY (EGD) WITH PROPOFOL;  Surgeon: Rachael Fee, MD;  Location: [facility];  Service: Endoscopy;  Laterality: N/A;    ESOPHAGOGASTRODUODENOSCOPY (EGD) WITH PROPOFOL N/A 05/16/2023   Procedure: ESOPHAGOGASTRODUODENOSCOPY (EGD) WITH PROPOFOL;  Surgeon: Meryl Dare, MD;  Location: [facility];  Service: Gastroenterology;  Laterality: N/A;   EUS N/A 04/06/2017   Procedure: UPPER ENDOSCOPIC ULTRASOUND (EUS) LINEAR;  Surgeon: Rachael Fee, MD;  Location: [facility];  Service: Endoscopy;  Laterality: N/A;  to evaluate duodenal mass   HERNIA REPAIR     IR BILIARY DRAIN PLACEMENT WITH CHOLANGIOGRAM  03/25/2017   IR CONVERT BILIARY DRAIN TO INT EXT BILIARY DRAIN  04/03/2017   POLYPECTOMY  05/16/2023   Procedure: POLYPECTOMY;  Surgeon: Meryl Dare, MD;  Location: [facility];  Service: Gastroenterology;;     Social History:   reports that she has quit smoking. Her smoking use included cigarettes. She has been exposed to tobacco smoke. She has quit using smokeless tobacco. She reports that she does not drink alcohol and does not use drugs.   Family History:  Her family history includes Colon cancer in her father; Diabetes in her paternal grandmother; Heart disease in her paternal grandfather; Hypertension in her brother and mother; Prostate cancer in her father; Rheum arthritis in her maternal grandmother.   Allergies Allergies  Allergen Reactions   Codeine Nausea And Vomiting    Made "very sick"   Tape      Home Medications  Prior to Admission medications   Medication Sig Start Date End Date Taking? Authorizing Provider  acetaminophen (TYLENOL) 325 MG tablet Take 650 mg by mouth every 6 (six) hours as needed for mild pain (pain score 1-3) or moderate pain (pain score 4-6).   Yes [provider]  allopurinol (ZYLOPRIM) 300 MG tablet Take 1 tablet (300 mg total) by mouth daily. 07/18/23  Yes Rothfuss, Teryl Lucy, PA-C  amiodarone (PACERONE) 200 MG tablet Take 1 tablet (200 mg total) by mouth daily. Please call [phone] to schedule cardiology follow up for further refills. 06/22/23  Yes  Alver Sorrow, NP  Ascorbic Acid (VITAMIN C CR) 500 MG TBCR Take 500 mg by mouth daily.   Yes [provider]  atorvastatin (LIPITOR) 20 MG tablet Take 1 tablet (20 mg total) by mouth daily. 12/19/22  Yes Alver Sorrow, NP  busPIRone (BUSPAR) 10 MG tablet Take 1 tablet (10 mg total) by mouth 2 (two) times daily. 07/18/23  Yes  Rothfuss, Teryl Lucy, PA-C  cholecalciferol (VITAMIN D) 1000 units tablet Take 1,000 Units by mouth daily.   Yes [provider]  cyanocobalamin (VITAMIN B12) 1000 MCG tablet Take 1 tablet by mouth daily.   Yes [provider]  ELIQUIS 5 MG TABS tablet Take 5 mg by mouth 2 (two) times daily. 07/02/23  Yes [provider]  ferrous sulfate 325 (65 FE) MG tablet Take 325 mg by mouth daily with breakfast.   Yes [provider]  folic acid (FOLVITE) 1 MG tablet Take 1 tablet (1 mg total) by mouth daily. 05/18/23  Yes Osvaldo Shipper, MD  furosemide (LASIX) 20 MG tablet Take 1 tablet (20 mg total) by mouth daily. 07/18/23  Yes Rothfuss, Teryl Lucy, PA-C  KLOR-CON M20 20 MEQ tablet Take 20 mEq by mouth daily.   Yes [provider]  levothyroxine (SYNTHROID) 100 MCG tablet Take 1 tablet (100 mcg total) by mouth daily before breakfast. 07/18/23  Yes Rothfuss, Gerilyn Pilgrim T, PA-C  melatonin 3 MG TABS tablet Take 1 tablet (3 mg total) by mouth at bedtime as needed (insomnia). 05/18/23  Yes Osvaldo Shipper, MD  ondansetron (ZOFRAN) 4 MG tablet Take 4 mg by mouth every 6 (six) hours as needed for nausea or vomiting.   Yes [provider]  oxyCODONE (OXY IR/ROXICODONE) 5 MG immediate release tablet Take 5 mg by mouth as needed for severe pain (pain score 7-10).   Yes [provider]  pantoprazole (PROTONIX) 40 MG tablet Take 1 tablet (40 mg total) by mouth daily. 07/18/23  Yes Rothfuss, Teryl Lucy, PA-C  sertraline (ZOLOFT) 25 MG tablet Take 1 tablet (25 mg total) by mouth daily. 07/18/23  Yes Rothfuss, Teryl Lucy, PA-C  tiZANidine  (ZANAFLEX) 4 MG tablet Take 1 tablet (4 mg total) by mouth every 6 (six) hours as needed for muscle spasms. 07/18/23  Yes Rothfuss, Teryl Lucy, PA-C     Critical care time: 55 min        CRITICAL CARE Performed by: Lanier Clam   Total critical care time: 55 minutes  Critical care time was exclusive of separately billable procedures and treating other patients. Critical care was necessary to treat or prevent imminent or life-threatening deterioration.  Critical care was time spent personally by me on the following activities: development of treatment plan with patient and/or surrogate as well as nursing, discussions with consultants, evaluation of patient's response to treatment, examination of patient, obtaining history from patient or surrogate, ordering and performing treatments and interventions, ordering and review of laboratory studies, ordering and review of radiographic studies, pulse oximetry and re-evaluation of patient's condition.  Tessie Fass MSN, AGACNP-BC [facility] Pulmonary/Critical Care Medicine Amion for pager  07/21/2023, 3:08 AM

## 2023-07-21 NOTE — Anesthesia Preprocedure Evaluation (Signed)
Anesthesia Evaluation  Patient identified by MRN, date of birth, ID band Patient awake    Reviewed: Allergy & Precautions, NPO status , Patient's Chart, lab work & pertinent test results  Airway Mallampati: III  TM Distance: >3 FB Neck ROM: Full    Dental no notable dental hx.    Pulmonary neg pulmonary ROS, former smoker   Pulmonary exam normal        Cardiovascular hypertension, Pt. on medications +CHF  + dysrhythmias Atrial Fibrillation  Rhythm:Regular Rate:Normal     Neuro/Psych   Anxiety     negative neurological ROS     GI/Hepatic PUD,GERD  Medicated,,GIB   Endo/Other  Hypothyroidism    Renal/GU   negative genitourinary   Musculoskeletal  (+) Arthritis , Osteoarthritis and Rheumatoid disorders,    Abdominal Normal abdominal exam  (+)   Peds  Hematology  (+) Blood dyscrasia, anemia Lab Results      Component                Value               Date                      WBC                      10.8 (H)            07/21/2023                HGB                      10.3 (L)            07/21/2023                HCT                      30.2 (L)            07/21/2023                MCV                      87.0                07/21/2023                PLT                      99 (L)              07/21/2023              Anesthesia Other Findings   Reproductive/Obstetrics                             Anesthesia Physical Anesthesia Plan  ASA: 3 and emergent  Anesthesia Plan: General   Post-op Pain Management:    Induction: Intravenous and Rapid sequence  PONV Risk Score and Plan: 3 and Ondansetron, Dexamethasone and Treatment may vary due to age or medical condition  Airway Management Planned: Mask and Oral ETT  Additional Equipment: None  Intra-op Plan:   Post-operative Plan: Extubation in OR  Informed Consent: I have reviewed the patients History and Physical, chart, labs  and discussed the procedure including the risks, benefits and alternatives for the proposed  anesthesia with the patient or authorized representative who has indicated his/her understanding and acceptance.     Dental advisory given  Plan Discussed with: CRNA  Anesthesia Plan Comments:        Anesthesia Quick Evaluation

## 2023-07-21 NOTE — ED Notes (Signed)
Hospitalist at bedside assessing pt condition. Per MD, pt will get 3 units of blood and switched to ICU level

## 2023-07-21 NOTE — Progress Notes (Signed)
OT Cancellation Note  Patient Details Name: Peggy Armstrong MRN: [id number] DOB: 11-21-1945   Cancelled Treatment:    Reason Eval/Treat Not Completed: Medical issues which prohibited therapy;Patient at procedure or test/ unavailable Patient off floor for EGD at this time. OT will follow back to complete evaluation when medically appropriate.   Pollyann Glen E. Siniyah Evangelist, OTR/L Acute Rehabilitation Services [phone]   Peggy Armstrong 07/21/2023, 9:43 AM

## 2023-07-21 NOTE — Progress Notes (Signed)
eLink Physician-Brief Progress Note Patient Name: Peggy Armstrong DOB: Nov 24, 1945 MRN: [id number]   Date of Service  07/21/2023  HPI/Events of Note  Anticoagulated patient who presented with GI bleeding, hypotension, acute blood loss anemia and required reversal of her anti-coagulation with KCENTRA, she has received 4 units PRBC so far, not a candidate for IF intervention so EGD is planned for the AM.  eICU Interventions  New Patient Evaluation.        Migdalia Dk 07/21/2023, 3:49 AM

## 2023-07-21 NOTE — ED Notes (Signed)
Delorise Shiner NP at bedside to evaluate patient at this time.

## 2023-07-21 NOTE — Progress Notes (Signed)
NAME:  Peggy Armstrong, MRN:  [id number], DOB:  06-10-1946, LOS: 0 ADMISSION DATE:  07/20/2023, CONSULTATION DATE:  07/21/23 REFERRING MD:  Arlean Hopping , CHIEF COMPLAINT:  UGIB   History of Present Illness:  78 yo F PMH HTN HLD diastolic HF, hx DVT, Afib on eliquis and recurrent GIB, who presented to ED 07/20/23 with bloody diarrhea beginning morning of presentation. 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 Eliquis resumed 4d later.  In ED she initially had borderline soft pressures and was given IVF while awaiting labs and CT angio. H/H 7.8, then 6.8 (with baseline around 11) and was transfused 1 PRBC and given Kcentra. CT anio with active bleeding within second and third portions of duodenum. GI and IR were engaged. Felt too proximal for IR intervention. Seen by GI. At that time she was not having active bloody BM and it was rec for 1 additional PRBC and EGD 1/24 AM with Dr. Chales Abrahams.   1/23-24 night, the patient starting having frequent dark bloody bowel movements. Hgb 7.8 (after 2 PRBC) She became hypotensive and was transfused 4 PRBC (5th pending) and given a second dose of Kcentra. TRH updated GI, and consulted PCCM for transfer to ICU.    Pertinent  Medical History  GIB Afib on eliquis DVT 2017 dHF HTN HLD  Hypothyroidism GERD Ampullary adenoma s/p ampullectomy 2019 [facility] Duodenal adenoma esophagitits  Significant Hospital Events: Including procedures, antibiotic start and stop dates in addition to other pertinent events   1/14 incomplete mucosal resection of duodenal adenoma at [facility]. 1/18 eliquis resumed 1/23 to ED w dark bloody BM, CT angio with active extrav. admit to [facility]. Kcentra and 2 PRBC. GI consult 1/24 incr output. Hypotensive. 2nd dose K Centra. Additional 5 PRBC. PCCM consult   Interim History / Subjective:  No further Bms since transfer to ICU, but feels like she's about to have one.  Had some nausea, improved  with zofran, denies abd pain or SOB.  C/o of chronic restless legs Labs pending post PRBC overnight  Objective   Blood pressure (!) 142/126, pulse 88, temperature 98.4 F (36.9 C), temperature source Oral, resp. rate (!) 23, height 5' (1.524 m), weight 84.6 kg, SpO2 98%.        Intake/Output Summary (Last 24 hours) at 07/21/2023 0735 Last data filed at 07/21/2023 0500 Gross per 24 hour  Intake 2573.17 ml  Output --  Net 2573.17 ml   Filed Weights   07/21/23 0456  Weight: 84.6 kg    Examination: General:  chronically ill appearing elderly female lying in bed, mildly anxious otherwise in NAD HEENT: MM pink/moist, edentulous  Neuro: Alert, oriented x 4, MAE- generalized weakness, resting tremor in UEs CV: rr, NSR, good distal/ warm pulses, no murmur PULM:  non labored, clear, RA GI: soft, obese, +bs, ND/ NT, purwick and rectal pouch Extremities: warm/dry, no pitting LE edema  Skin: pale, no rashes, scattered bruising to LE  Reports unmeasured UOP with prior Bms overnight Repeat AM labs pending  Resolved Hospital Problem list     Assessment & Plan:   Hemorrhagic shock, resolved Active duodenal bleeds  ABLA on chronic anemia Medication induced coagulopathy Known duodenal adenoma, 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 Chales Abrahams this am.   - cont PPI BID - CBC now and  trend, transfuse for Hgb < 7 or significant blood loss - will also need further f/u at [facility] for remaining duodenal resection   AKI - unmeasured urinary occurrences with prior Bms overnight per pt - BMET pending - cont purwick - hemodynamic support - Trend BMP / urinary output - Replace electrolytes as indicated - Avoid nephrotoxic agents, ensure adequate renal perfusion   Hx Afib Chronic anticoagulation - last dose eliquis 1/22 s/p Kcentra 1/23 and 1/24 P:  - remains in NSR, monitor on tele -  resume amio when taking PO, IV not needed at this time - optimize lytes - cont to hold eliquis> timing to restart to be deferred to GI as will need further duodenal resection.    Hx HTN HLD Hx rHFpEF - 05/2023 TTE LVEF 55-60%, normal diastolic, normal RV, mildly elevated RVSP 42.7 P:  - cont to hold home lasix with hypotension overnight.  Remains on room air.  Does not appear to be on other antihypertensives - hold lipitor  Elevated LFTs, mild and improving - repeat LFTs/ coags pending  Hypothyroidism - resume synthroid when taking PO  Depression/ anxiety - resume zoloft/ buspar when taking POs  Debility, chronic  Hx lumbar fx/ chronic pain - d/c'd from rehab early Jan.  Uses wheelchair, walker at home - PT/ OT - tylenol prn, rarely uses oxy at home per pt, gets more benefit from tylenol/ muscle relaxer's   Best Practice (right click and "Reselect all SmartList Selections" daily)   Diet/type: NPO DVT prophylaxis SCD Pressure ulcer(s): pressure ulcer assessment deferred . She became profoundly hypotensive the last time RN cleaned her up, I did not feel appropriate to turn her at this time Lines: N/A Foley:  N/A Code Status:  full code Last date of multidisciplinary goals of care discussion [1/24 ]  Labs   CBC: Recent Labs  Lab 07/18/23 1542 07/20/23 1213 07/20/23 1401 07/20/23 2305  WBC 4.9 8.8  --   --   NEUTROABS 3.8 7.1  --   --   HGB 10.4* 7.8* 6.8* 7.8*  HCT 34.2 26.5* 22.7* 24.4*  MCV 97 101.5*  --   --   PLT 116* 150  --   --     Basic Metabolic Panel: Recent Labs  Lab 07/18/23 1542 07/20/23 1213  NA 141 140  K 4.4 4.2  CL 102 105  CO2 23 23  GLUCOSE 96 147*  BUN 18 26*  CREATININE 0.93 1.09*  CALCIUM 8.5* 7.8*   GFR: Estimated Creatinine Clearance: 41.7 mL/min (A) (by C-G formula based on SCr of 1.09 mg/dL (H)). Recent Labs  Lab 07/18/23 1542 07/20/23 1213  WBC 4.9 8.8    Liver Function Tests: Recent Labs  Lab 07/18/23 1542  07/20/23 1213  AST 48* 42*  ALT 30 26  ALKPHOS 160* 99  BILITOT 0.3 0.6  PROT 5.7* 4.7*  ALBUMIN 3.3* 2.4*   No results for input(s): "LIPASE", "AMYLASE" in the last 168 hours. No results for input(s): "AMMONIA" in the last 168 hours.  ABG No results found for: "PHART", "PCO2ART", "PO2ART", "HCO3", "TCO2", "ACIDBASEDEF", "O2SAT"   Coagulation Profile: Recent Labs  Lab 07/20/23 1213  INR 1.6*    Cardiac Enzymes: No results for input(s): "CKTOTAL", "CKMB", "CKMBINDEX", "TROPONINI" in the last 168 hours.  HbA1C: Hgb A1c MFr Bld  Date/Time Value Ref Range Status  07/18/2023 03:42 PM 4.3 (L) 4.8 - 5.6 % Final    Comment:  Prediabetes: 5.7 - 6.4          Diabetes: >6.4          Glycemic control for adults with diabetes: <7.0     CBG: Recent Labs  Lab 07/21/23 0327  GLUCAP 110*    Allergies Allergies  Allergen Reactions   Codeine Nausea And Vomiting    Made "very sick"   Tape      Home Medications  Prior to Admission medications   Medication Sig Start Date End Date Taking? Authorizing Provider  acetaminophen (TYLENOL) 325 MG tablet Take 650 mg by mouth every 6 (six) hours as needed for mild pain (pain score 1-3) or moderate pain (pain score 4-6).   Yes [provider]  allopurinol (ZYLOPRIM) 300 MG tablet Take 1 tablet (300 mg total) by mouth daily. 07/18/23  Yes Rothfuss, Teryl Lucy, PA-C  amiodarone (PACERONE) 200 MG tablet Take 1 tablet (200 mg total) by mouth daily. Please call [phone] to schedule cardiology follow up for further refills. 06/22/23  Yes Alver Sorrow, NP  Ascorbic Acid (VITAMIN C CR) 500 MG TBCR Take 500 mg by mouth daily.   Yes [provider]  atorvastatin (LIPITOR) 20 MG tablet Take 1 tablet (20 mg total) by mouth daily. 12/19/22  Yes Alver Sorrow, NP  busPIRone (BUSPAR) 10 MG tablet Take 1 tablet (10 mg total) by mouth 2 (two) times daily. 07/18/23  Yes Rothfuss, Teryl Lucy, PA-C  cholecalciferol (VITAMIN  D) 1000 units tablet Take 1,000 Units by mouth daily.   Yes [provider]  cyanocobalamin (VITAMIN B12) 1000 MCG tablet Take 1 tablet by mouth daily.   Yes [provider]  ELIQUIS 5 MG TABS tablet Take 5 mg by mouth 2 (two) times daily. 07/02/23  Yes [provider]  ferrous sulfate 325 (65 FE) MG tablet Take 325 mg by mouth daily with breakfast.   Yes [provider]  folic acid (FOLVITE) 1 MG tablet Take 1 tablet (1 mg total) by mouth daily. 05/18/23  Yes Osvaldo Shipper, MD  furosemide (LASIX) 20 MG tablet Take 1 tablet (20 mg total) by mouth daily. 07/18/23  Yes Rothfuss, Teryl Lucy, PA-C  KLOR-CON M20 20 MEQ tablet Take 20 mEq by mouth daily.   Yes [provider]  levothyroxine (SYNTHROID) 100 MCG tablet Take 1 tablet (100 mcg total) by mouth daily before breakfast. 07/18/23  Yes Rothfuss, Gerilyn Pilgrim T, PA-C  melatonin 3 MG TABS tablet Take 1 tablet (3 mg total) by mouth at bedtime as needed (insomnia). 05/18/23  Yes Osvaldo Shipper, MD  ondansetron (ZOFRAN) 4 MG tablet Take 4 mg by mouth every 6 (six) hours as needed for nausea or vomiting.   Yes [provider]  oxyCODONE (OXY IR/ROXICODONE) 5 MG immediate release tablet Take 5 mg by mouth as needed for severe pain (pain score 7-10).   Yes [provider]  pantoprazole (PROTONIX) 40 MG tablet Take 1 tablet (40 mg total) by mouth daily. 07/18/23  Yes Rothfuss, Teryl Lucy, PA-C  sertraline (ZOLOFT) 25 MG tablet Take 1 tablet (25 mg total) by mouth daily. 07/18/23  Yes Rothfuss, Teryl Lucy, PA-C  tiZANidine (ZANAFLEX) 4 MG tablet Take 1 tablet (4 mg total) by mouth every 6 (six) hours as needed for muscle spasms. 07/18/23  Yes Rothfuss, Teryl Lucy, PA-C     Critical care time: 38 min         Posey Boyer, MSN, AG-ACNP-BC [facility] 07/21/2023, 7:36 AM  See Amion for pager If no response to pager , please call [phone] until 7pm After 7:00 pm call Elink   [phone]

## 2023-07-21 NOTE — Anesthesia Procedure Notes (Signed)
Procedure Name: Intubation Date/Time: 07/21/2023 9:52 AM  Performed by: Wilder Glade, CRNAPre-anesthesia Checklist: Patient identified, Emergency Drugs available, Suction available, Patient being monitored and Timeout performed Patient Re-evaluated:Patient Re-evaluated prior to induction Oxygen Delivery Method: Circle system utilized Preoxygenation: Pre-oxygenation with 100% oxygen Induction Type: IV induction Ventilation: Mask ventilation without difficulty Laryngoscope Size: Miller and 2 Grade View: Grade I Tube type: Oral Tube size: 7.0 mm Number of attempts: 1 Airway Equipment and Method: Stylet Placement Confirmation: ETT inserted through vocal cords under direct vision, positive ETCO2 and breath sounds checked- equal and bilateral Secured at: 20 cm Tube secured with: Tape Dental Injury: Teeth and Oropharynx as per pre-operative assessment  Comments: Brief atraumatic oral gums no change from preop

## 2023-07-21 NOTE — Progress Notes (Signed)
2000 Upon initial assessment, pt's urine noted to contain bright red blood. Pt with 100 mL UOP on day shift, around 50 mL in canister at this time. Notified [facility]. Order received for 500 mL NS bolus.  0000 Pt with transient hypotension in the 70s/50s, lowest readings 50s/20s. Difficult to obtain consistent readings due pt edematous, as shown below. Pt almost 5L + since admit per I/O flowsheet. Remains w/ poor UOP at this time. Notified [facility], received orders for levophed infusion, LR bolus, new lab orders. New IV placed for pressors from IV team. Levophed order placed for 0-40 mcg, pt with no central access. MD made aware.   07/21/23 2337 07/21/23 2341 07/22/23 0000  Vitals  BP (!) 87/42 (!) 72/56 (!) 50/29  MAP (mmHg) (!) 57 (!) 62 (!) 37    07/22/23 0003 07/22/23 0008 07/22/23 0013  Vitals  BP (!) 82/36 (!) 65/29 (!) 95/51  MAP (mmHg) (!) 49 (!) 41 (!) 59    07/22/23 0018 07/22/23 0020 07/22/23 0030  Vitals  BP (!) 53/27 134/82 (!) 117/107  MAP (mmHg) (!) 36 90 112    07/22/23 0037 07/22/23 0040  Vitals  BP 120/63 (!) 112/51  MAP (mmHg) 81 70

## 2023-07-21 NOTE — Progress Notes (Signed)
UOP 100 cc  Bladder scan = 0 cc  Clear liquid diet started at 1800

## 2023-07-21 NOTE — Op Note (Signed)
[facility] Patient Name: Peggy Armstrong Procedure Date : 07/21/2023 MRN: [id number] Attending MD: Lynann Bologna , MD, [id number] Date of Birth: [birth year] CSN: [id number] Age: 78 Admit Type: Inpatient Procedure:                Upper GI endoscopy Indications:              1. S/P EMR bleeding on eliquis. S/P 5U, Kcentra. 2.                            EMR of large 5 cm multilobulated duodenal polyp in                            2nd portion of duodenum by Dr. Jamse Mead 07/11/2023,                            50-60% resected. Purastat was applied. Eliquis                            restarted 07/15/2023. Bx-intestinal type adenoma                            with multifocal high-grade dysplasia but no                            carcinoma. 3. Prior H/O ampullary adenoma s/p                            ampullectomy 2019 complicated by bleeding. Providers:                Lynann Bologna, MD, Fransisca Connors, Geoffery Lyons,                            Technician Referring MD:              Medicines:                General Anesthesia Complications:            No immediate complications. Estimated Blood Loss:     Estimated blood loss was minimal. Procedure:                Pre-Anesthesia Assessment:                           - Prior to the procedure, a History and Physical                            was performed, and patient medications and                            allergies were reviewed. The patient's tolerance of                            previous anesthesia was also reviewed. The risks  and benefits of the procedure and the sedation                            options and risks were discussed with the patient.                            All questions were answered, and informed consent                            was obtained. Prior Anticoagulants: The patient has                            taken Eliquis (apixaban), last dose was 2 days                             prior to procedure. ASA Grade Assessment: E -                            Emergency. After reviewing the risks and benefits,                            the patient was deemed in satisfactory condition to                            undergo the procedure.                           After obtaining informed consent, the endoscope was                            passed under direct vision. Throughout the                            procedure, the patient's blood pressure, pulse, and                            oxygen saturations were monitored continuously. The                            [id number]-H190 ([id number] Olympus endoscope was introduced                            through the mouth, and advanced to the second part                            of duodenum. The upper GI endoscopy was                            accomplished without difficulty. The patient                            tolerated the procedure well.  At first EGD scope was used followed by ERCP                            side-viewing scope. Therapeutic intervention was                            done using ERCP scope. Scope In: Scope Out: Findings:      The examined esophagus was normal.      A medium-sized hiatal hernia was present.      Multiple 6 to 8 mm sessile polyps with no stigmata of recent bleeding       were found in the stomach.      Residual tumor was noted in the second portion of the duodenum. This has       been partially resected. 3 distinct ulcers were noted. One over the area       of previous ampullectomy. I could see bile drainage. Purastat was       applied. The second ulcer was in the distal duodenum with visible vessel       and evidence of recent bleeding in form of clot. The clot was gently       removed. A single clip was placed across the visible vessel with good       results. 3 cc of Purastat was applied to this area. Another small ulcer       in the proximal second portion of the  duodenum along anterior wall with       a small clot. The clot was removed. Purastat was applied.      There was no bleeding at the end of the procedure. Impression:               - Post EMR ulcers -1 with visible vessel s/p                            clipping/purastat.                           - About 50% of the residual tumor remains.                           - Medium-sized hiatal hernia.                           - Multiple gastric polyps.                           - No specimens collected. Recommendation:           - Return patient to ICU for ongoing care.                           - NPO until 6 PM, then clear liquid diet.                           - Trend CBC. Keep Hb >7                           - Hold Eliquis                           -  Continue present medications.                           - Will follow.                           - If any recurrent bleeding, recommend IR                            embolization. Can use clip as target.                           - The findings and recommendations were discussed                            with the patient's family.                           - Will get in touch with Dr Jamse Mead. Procedure Code(s):        --- Professional ---                           [id number], Esophagogastroduodenoscopy, flexible,                            transoral; with control of bleeding, any method Diagnosis Code(s):        --- Professional ---                           K44.9, Diaphragmatic hernia without obstruction or                            gangrene                           K31.7, Polyp of stomach and duodenum                           K26.4, Chronic or unspecified duodenal ulcer with                            hemorrhage                           K92.1, Melena (includes Hematochezia) CPT copyright 2022 [employer]. All rights reserved. The codes documented in this report are preliminary and upon coder review may  be revised to meet  current compliance requirements. Lynann Bologna, MD 07/21/2023 10:55:08 AM This report has been signed electronically. Number of Addenda: 0

## 2023-07-21 NOTE — ED Notes (Signed)
Notified and updated patient daughter on patient status at this time

## 2023-07-21 NOTE — ED Notes (Addendum)
Dr Chales Abrahams notified of patients 2 large red/maroon stools at this time. Pt VS are stable.

## 2023-07-21 NOTE — ED Notes (Signed)
Blood handoff done with ICU RN at bedside

## 2023-07-21 NOTE — Progress Notes (Addendum)
[facility] night cross cover note:   I was notified by RN that this patient, who was admitted earlier in the day for suspected recurrent upper GIB complicated by acute on chronic blood loss anemia, has been experiencing additional episodes of melena.  He is chronically anticoagulated on Eliquis, and received Kcentra earlier in the day.  Her gastroenterology has consulted for EGD in the morning.  She is receiving IV Protonix.  Noted to have Hgb of 6.8 this afternoon.  Following transfusion of 2 units PRBC, hemoglobin increased to 7.8.  Most recent blood pressures been in the low 100s over 70s, with most recent blood pressure noted to be 103/77, relative to systolic blood pressures in the 90s to low 100s throughout her ED course.  Most recent heart rates in the 80s.   While her vital signs appear stable at this time, in light of the additional melena, and less than anticipated interval increase in hgb following aforementioned transfusion of 2 units PRBC, will proceed with transfusion of two additional units prbc,with CBC ordered to be checked post-transfusion. Continue with iv protonix.  Existing orders for acute 6-hour H&H's are noted. Additionally, patient's RN updating [facility] at this time.    Update: Patient with continued episodes of dark to the red-colored bleeding per rectum, now with at least 4-5 such episodes over the course of the last hour.  She has now become symptomatic, reporting new onset dizziness, diaphoresis, as well as some nausea.  Has also been associate with interval decline in the patient's systolic blood pressures, which decreased from the 90s to low 100s into the 70s to 80s mmHg.  After starting on third unit PRBC, her systolic blood pressure has improved into the low 100s, but she does continue to have additional episodes of bleeding per rectum, as above.  RN updated on-call [facility], Dr. Marina Goodell, who requested transfusion of a total of 3 additional units prbc (units 3, 4, and 5), requested that  patient be transferred to the critical care service, with plan for urgent EGD. I also discussed case with pharmacist, Ivery Quale, [facility], who, in the setting of the patient's continued bleeding, recommended an additional dose of Kcentra to which I provided verbal order to initiate.   I subsequently discussed the patient's case along with the above updates, with on-call PCCM, Dr. Lonzo Candy, following which the patient has been transferred from the hospitalist service to the critical care service.    Newton Pigg, DO Hospitalist

## 2023-07-21 NOTE — ED Notes (Addendum)
Spoke with hospitalist about pt condition, pt has had 5 large red/maroon stools in the last 45 mins and is pale and weak. He ordered 1 unit of blood to be given and to notify GI MD at this time.

## 2023-07-21 NOTE — ED Notes (Addendum)
This RN called to assigned RN to inform her of pt hypotension and recurrent bloody stools. This RN called charge to inform her of patient condition while assigned RN was retrieving blood for pt. After assigned RN arrival with blood, this RN with the help of charge RN signed off and getting ready to administer blood.

## 2023-07-21 NOTE — Plan of Care (Signed)
  Problem: Education: Goal: Knowledge of General Education information will improve Description: Including pain rating scale, medication(s)/side effects and non-pharmacologic comfort measures Outcome: Progressing   Problem: Health Behavior/Discharge Planning: Goal: Ability to manage health-related needs will improve Outcome: Progressing   Problem: Clinical Measurements: Goal: Ability to maintain clinical measurements within normal limits will improve Outcome: Progressing Goal: Will remain free from infection Outcome: Progressing Goal: Diagnostic test results will improve Outcome: Progressing Goal: Respiratory complications will improve Outcome: Progressing Goal: Cardiovascular complication will be avoided Outcome: Progressing   Problem: Activity: Goal: Risk for activity intolerance will decrease Outcome: Progressing   Problem: Nutrition: Goal: Adequate nutrition will be maintained Outcome: Progressing   Problem: Nutrition: Goal: Adequate nutrition will be maintained Outcome: Progressing Problem: Coping: Goal: Level of anxiety will decrease Outcome: Progressing   Problem: Elimination: Goal: Will not experience complications related to bowel motility Outcome: Progressing Goal: Will not experience complications related to urinary retention Outcome: Progressing   Problem: Pain Managment: Goal: General experience of comfort will improve and/or be controlled Outcome: Progressing

## 2023-07-21 NOTE — ED Notes (Addendum)
Report called to Abby RN

## 2023-07-21 NOTE — Progress Notes (Addendum)
Patient ID: Peggy Armstrong, female   DOB: [birth year], 78 y.o.   MRN: [id number]    Progress Note   Subjective   Day # 1 CC;acute GI bleed, major  Patient unfortunately had active bleeding during the night, several dark bloody bowel movements, and hemoglobin down to 7.8 after 2 units of packed RBCs with associated hypotension. Transfused a total of 5 units of packed RBCs and got second dose of Kcentra  Rectal pouch placed, now with black stool small amount over the past couple of hours since she has been in 68M. Is alert, mentating well, feels weak  Stat hemoglobin pending/INR pending Blood pressure currently 105/85    Objective   Vital signs in last 24 hours: Temp:  [97.4 F (36.3 C)-98.6 F (37 C)] 97.8 F (36.6 C) (01/24 0800) Pulse Rate:  [76-289] 88 (01/24 0800) Resp:  [11-30] 11 (01/24 0800) BP: (63-143)/(32-126) 118/103 (01/24 0800) SpO2:  [87 %-100 %] 98 % (01/24 0800) Weight:  [84.6 kg] 84.6 kg (01/24 0456) Last BM Date : 07/21/23 General: Elderly white female in NAD, fatigued appearing Heart:  Regular rate and rhythm; no murmurs Lungs: Respirations even and unlabored, lungs CTA bilaterally Abdomen:  Soft, nontender and nondistended. Normal bowel sounds. Extremities:  Without edema. Neurologic:  Alert and oriented,  grossly normal neurologically. Psych:  Cooperative. Normal mood and affect.  Intake/Output from previous day: 01/23 0701 - 01/24 0700 In: 2573.2 [I.V.:287; Blood:2125.8; IV Piggyback:160.3] Out: -  Intake/Output this shift: No intake/output data recorded.  Lab Results: Recent Labs    07/18/23 1542 07/20/23 1213 07/20/23 1401 07/20/23 2305  WBC 4.9 8.8  --   --   HGB 10.4* 7.8* 6.8* 7.8*  HCT 34.2 26.5* 22.7* 24.4*  PLT 116* 150  --   --    BMET Recent Labs    07/18/23 1542 07/20/23 1213  NA 141 140  K 4.4 4.2  CL 102 105  CO2 23 23  GLUCOSE 96 147*  BUN 18 26*  CREATININE 0.93 1.09*  CALCIUM 8.5* 7.8*   LFT Recent Labs     07/20/23 1213  PROT 4.7*  ALBUMIN 2.4*  AST 42*  ALT 26  ALKPHOS 99  BILITOT 0.6   PT/INR Recent Labs    07/20/23 1213 07/21/23 0804  LABPROT 19.5* 15.7*  INR 1.6* 1.2    Studies/Results: CT Angio Abd/Pel W and/or Wo Contrast Result Date: 07/20/2023 CLINICAL DATA:  Dark red blood in stool since this morning, dizziness, anticoagulated EXAM: CTA ABDOMEN AND PELVIS WITHOUT AND WITH CONTRAST TECHNIQUE: Multidetector CT imaging of the abdomen and pelvis was performed using the standard protocol during bolus administration of intravenous contrast. Multiplanar reconstructed images and MIPs were obtained and reviewed to evaluate the vascular anatomy. RADIATION DOSE REDUCTION: This exam was performed according to the departmental dose-optimization program which includes automated exposure control, adjustment of the mA and/or kV according to patient size and/or use of iterative reconstruction technique. CONTRAST:  75mL OMNIPAQUE IOHEXOL 350 MG/ML SOLN COMPARISON:  05/15/2023 FINDINGS: VASCULAR Aorta: Normal caliber aorta without aneurysm, dissection, vasculitis or significant stenosis. Diffuse atherosclerosis. Celiac: Patent without evidence of aneurysm, dissection, vasculitis or significant stenosis. SMA: Patent without evidence of aneurysm, dissection, vasculitis or significant stenosis. 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 Renals: Both renal arteries are patent without evidence of aneurysm, dissection, vasculitis, fibromuscular dysplasia or significant stenosis. Mild atherosclerosis without critical stenosis. IMA: Patent without evidence of aneurysm, dissection, vasculitis or significant stenosis. Inflow: There is  diffuse atherosclerosis throughout the iliac vessels. Focal high-grade stenosis at the origin of the left iliac artery estimated greater than 70%. Remaining vessels are patent. No aneurysm, dissection, or vasculitis. Proximal Outflow: Bilateral common femoral  and visualized portions of the superficial and profunda femoral arteries are patent without evidence of aneurysm, dissection, vasculitis or significant stenosis. Diffuse atherosclerosis. Veins: No obvious venous abnormality within the limitations of this arterial phase study. Review of the MIP images confirms the above findings. NON-VASCULAR Lower chest: Trace right pleural fluid.  Moderate hiatal hernia. Hepatobiliary: No focal liver abnormality is seen. Status post cholecystectomy. No biliary dilatation. Pancreas: Unremarkable. No pancreatic ductal dilatation or surrounding inflammatory changes. Spleen: Normal in size without focal abnormality. Adrenals/Urinary Tract: Left renal cortical cyst do not require specific imaging follow-up. There is bilateral renal cortical thinning. No urinary tract calculi or obstructive uropathy. Adrenals and bladder are unremarkable. Stomach/Bowel: No bowel obstruction or ileus. Moderate hiatal hernia. There is abnormal mural thickening involving the second and third portions of the duodenum, with mild adjacent fat stranding. Active gastrointestinal bleeding is identified within this segment of the duodenum. Differential diagnosis would include duodenitis, peptic ulcer disease, or neoplasm. Endoscopy is recommended for further evaluation. Lymphatic: There are several subcentimeter lymph nodes adjacent to the duodenal wall thickening, largest measuring 7 mm reference image 46/5. No pathologic adenopathy within the abdomen or pelvis. Reproductive: Status post hysterectomy. No adnexal masses. Other: Trace pelvic free fluid. No free intraperitoneal gas. No abdominal wall hernia. Musculoskeletal: No acute or destructive bony abnormalities. Chronic L2 compression deformity with resulting vertebra plana unchanged since prior exam. Healing fracture through the superior margin of the S2 vertebral body, with increased callus formation since prior exam. Reconstructed images demonstrate no  additional findings. IMPRESSION: VASCULAR 1. Active gastrointestinal bleeding within the second and third portions of the duodenum, associated with segmental duodenal wall thickening and fat stranding. 2. Aortic Atherosclerosis (ICD10-I70.0). Atherosclerosis throughout the branch vessels as above, with focal high-grade stenosis at the origin of the left internal iliac artery. NON-VASCULAR 1. Wall thickening of the second and third portion duodenum, with mild surrounding fat stranding and subcentimeter lymph nodes. Differential diagnosis includes duodenitis, peptic ulcer disease, or neoplasm. Endoscopy is recommended for further evaluation. 2. Moderate hiatal hernia. 3. Trace pelvic free fluid. Critical Value/emergent results were called by telephone at the time of interpretation on 07/20/2023 at 4:02 pm to provider Marita Kansas, PA, who verbally acknowledged these results. Electronically Signed   By: Sharlet Salina M.D.   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  She  is now status post 5 units packed RBCs and 2 doses of Kcentra Currently stable with black stool via rectal tube  #2 anemia acute on chronic secondary to acute GI blood loss #3 acute kidney injury #4 atrial fibrillation on chronic anticoagulation/Eliquis  #5 congestive heart failure third EF #6 history of lumbar fracture with chronic back pain  Plan; will proceed with EGD shortly with Dr. Chales Abrahams for hemostasis Await stat  labs continue to  transfuse as needed Plan to resume Eliquis at least a couple of weeks and not until we have discussed with [facility] regarding plans for further endoscopic Mus coastal resection.  Principal Problem:   GI bleed Active Problems:   Hemorrhagic shock (HCC)   ABLA (acute blood loss anemia)   Acute GI bleeding   AKI (acute kidney injury) (HCC)     LOS: 0 days   Amy EsterwoodPA-C  07/21/2023, 8:39 AM     Attending physician's note   I have taken history, reviewed the chart and examined the patient. I performed a substantive portion of this encounter, including complete performance of at least one of the key components, in conjunction with the APP. I agree with the Advanced Practitioner's note, impression and recommendations.   Pt With continued bleeding S/P 5U  PRBC,  Kcentra  Emergent EGD IV protonix Family aware   Edman Circle, MD [facility] [phone]

## 2023-07-22 ENCOUNTER — Inpatient Hospital Stay (HOSPITAL_COMMUNITY): Payer: 59

## 2023-07-22 ENCOUNTER — Encounter (HOSPITAL_COMMUNITY): Payer: Self-pay | Admitting: Pulmonary Disease

## 2023-07-22 DIAGNOSIS — R571 Hypovolemic shock: Secondary | ICD-10-CM | POA: Diagnosis not present

## 2023-07-22 DIAGNOSIS — D62 Acute posthemorrhagic anemia: Secondary | ICD-10-CM | POA: Diagnosis not present

## 2023-07-22 DIAGNOSIS — K922 Gastrointestinal hemorrhage, unspecified: Secondary | ICD-10-CM | POA: Diagnosis not present

## 2023-07-22 HISTORY — PX: IR US GUIDE VASC ACCESS RIGHT: IMG2390

## 2023-07-22 HISTORY — PX: IR ANGIOGRAM VISCERAL SELECTIVE: IMG657

## 2023-07-22 HISTORY — PX: IR EMBO ART  VEN HEMORR LYMPH EXTRAV  INC GUIDE ROADMAPPING: IMG5450

## 2023-07-22 LAB — BASIC METABOLIC PANEL
Anion gap: 7 (ref 5–15)
Anion gap: 8 (ref 5–15)
BUN: 42 mg/dL — ABNORMAL HIGH (ref 8–23)
BUN: 35 mg/dL — ABNORMAL HIGH (ref 8–23)
CO2: 23 mmol/L (ref 22–32)
CO2: 24 mmol/L (ref 22–32)
Calcium: 7.4 mg/dL — ABNORMAL LOW (ref 8.9–10.3)
Calcium: 7.9 mg/dL — ABNORMAL LOW (ref 8.9–10.3)
Chloride: 104 mmol/L (ref 98–111)
Chloride: 106 mmol/L (ref 98–111)
Creatinine, Ser: 1.37 mg/dL — ABNORMAL HIGH (ref 0.44–1.00)
Creatinine, Ser: 1.17 mg/dL — ABNORMAL HIGH (ref 0.44–1.00)
GFR, Estimated: 40 mL/min — ABNORMAL LOW (ref >60)
GFR, Estimated: 48 mL/min — ABNORMAL LOW (ref >60)
Glucose, Bld: 120 mg/dL — ABNORMAL HIGH (ref 70–99)
Glucose, Bld: 107 mg/dL — ABNORMAL HIGH (ref 70–99)
Potassium: 4.7 mmol/L (ref 3.5–5.1)
Potassium: 3.7 mmol/L (ref 3.5–5.1)
Sodium: 134 mmol/L — ABNORMAL LOW (ref 135–145)
Sodium: 138 mmol/L (ref 135–145)

## 2023-07-22 LAB — HEPATIC FUNCTION PANEL
ALT: 19 U/L (ref 0–44)
AST: 24 U/L (ref 15–41)
Albumin: 1.8 g/dL — ABNORMAL LOW (ref 3.5–5.0)
Alkaline Phosphatase: 58 U/L (ref 38–126)
Bilirubin, Direct: 0.2 mg/dL (ref 0.0–0.2)
Indirect Bilirubin: 0.6 mg/dL (ref 0.3–0.9)
Total Bilirubin: 0.8 mg/dL (ref 0.0–1.2)
Total Protein: 3.4 g/dL — ABNORMAL LOW (ref 6.5–8.1)

## 2023-07-22 LAB — CBC WITH DIFFERENTIAL/PLATELET
Abs Immature Granulocytes: 0.34 10*3/uL — ABNORMAL HIGH (ref 0.00–0.07)
Basophils Absolute: 0.1 10*3/uL (ref 0.0–0.1)
Basophils Relative: 0 %
Eosinophils Absolute: 0 10*3/uL (ref 0.0–0.5)
Eosinophils Relative: 0 %
HCT: 30.6 % — ABNORMAL LOW (ref 36.0–46.0)
Hemoglobin: 10 g/dL — ABNORMAL LOW (ref 12.0–15.0)
Immature Granulocytes: 3 %
Lymphocytes Relative: 14 %
Lymphs Abs: 1.6 10*3/uL (ref 0.7–4.0)
MCH: 30 pg (ref 26.0–34.0)
MCHC: 32.7 g/dL (ref 30.0–36.0)
MCV: 91.9 fL (ref 80.0–100.0)
Monocytes Absolute: 1 10*3/uL (ref 0.1–1.0)
Monocytes Relative: 9 %
Neutro Abs: 8.4 10*3/uL — ABNORMAL HIGH (ref 1.7–7.7)
Neutrophils Relative %: 74 %
Platelets: 161 10*3/uL (ref 150–400)
RBC: 3.33 MIL/uL — ABNORMAL LOW (ref 3.87–5.11)
RDW: 16 % — ABNORMAL HIGH (ref 11.5–15.5)
WBC: 11.4 10*3/uL — ABNORMAL HIGH (ref 4.0–10.5)
nRBC: 0.4 % — ABNORMAL HIGH (ref 0.0–0.2)

## 2023-07-22 LAB — DIC (DISSEMINATED INTRAVASCULAR COAGULATION)PANEL
D-Dimer, Quant: 0.33 ug{FEU}/mL (ref 0.00–0.50)
Fibrinogen: 163 mg/dL — ABNORMAL LOW (ref 210–475)
INR: 1.2 (ref 0.8–1.2)
Platelets: 159 10*3/uL (ref 150–400)
Prothrombin Time: 15.2 s (ref 11.4–15.2)
Smear Review: NONE SEEN
aPTT: 29 s (ref 24–36)

## 2023-07-22 LAB — GLUCOSE, CAPILLARY
Glucose-Capillary: 133 mg/dL — ABNORMAL HIGH (ref 70–99)
Glucose-Capillary: 122 mg/dL — ABNORMAL HIGH (ref 70–99)
Glucose-Capillary: 111 mg/dL — ABNORMAL HIGH (ref 70–99)
Glucose-Capillary: 96 mg/dL (ref 70–99)

## 2023-07-22 LAB — HEMOGLOBIN AND HEMATOCRIT, BLOOD
HCT: 21.9 % — ABNORMAL LOW (ref 36.0–46.0)
HCT: 28.5 % — ABNORMAL LOW (ref 36.0–46.0)
HCT: 28.9 % — ABNORMAL LOW (ref 36.0–46.0)
Hemoglobin: 7.4 g/dL — ABNORMAL LOW (ref 12.0–15.0)
Hemoglobin: 9.6 g/dL — ABNORMAL LOW (ref 12.0–15.0)
Hemoglobin: 9.9 g/dL — ABNORMAL LOW (ref 12.0–15.0)

## 2023-07-22 LAB — PHOSPHORUS: Phosphorus: 4.7 mg/dL — ABNORMAL HIGH (ref 2.5–4.6)

## 2023-07-22 LAB — PREPARE RBC (CROSSMATCH)

## 2023-07-22 LAB — MAGNESIUM: Magnesium: 2.2 mg/dL (ref 1.7–2.4)

## 2023-07-22 LAB — APTT: aPTT: 29 s (ref 24–36)

## 2023-07-22 MED ORDER — MIDAZOLAM HCL 2 MG/2ML IJ SOLN
INTRAMUSCULAR | Status: AC | PRN
  Administered 2023-07-22: .5 mg via INTRAVENOUS
  Administered 2023-07-22: 1 mg via INTRAVENOUS
  Administered 2023-07-22: .5 mg via INTRAVENOUS

## 2023-07-22 MED ORDER — LIDOCAINE HCL 1 % IJ SOLN
INTRAMUSCULAR | Status: AC
Start: 2023-07-22 — End: ?
  Filled 2023-07-22: qty 20

## 2023-07-22 MED ORDER — NOREPINEPHRINE 4 MG/250ML-% IV SOLN
0.0000 ug/min | INTRAVENOUS | Status: DC
Start: 2023-07-22 — End: 2023-07-23
  Administered 2023-07-22: 4 ug/min via INTRAVENOUS
  Administered 2023-07-22: 8 ug/min via INTRAVENOUS
  Filled 2023-07-22 (×3): qty 250

## 2023-07-22 MED ORDER — IOHEXOL 300 MG/ML  SOLN
150.0000 mL | Freq: Once | INTRAMUSCULAR | Status: AC | PRN
  Administered 2023-07-22: 100 mL via INTRA_ARTERIAL

## 2023-07-22 MED ORDER — FENTANYL CITRATE (PF) 100 MCG/2ML IJ SOLN
INTRAMUSCULAR | Status: AC | PRN
  Administered 2023-07-22: 25 ug via INTRAVENOUS
  Administered 2023-07-22: 50 ug via INTRAVENOUS
  Administered 2023-07-22: 25 ug via INTRAVENOUS

## 2023-07-22 MED ORDER — AMIODARONE HCL 200 MG PO TABS
200.0000 mg | ORAL_TABLET | Freq: Every day | ORAL | Status: DC
  Administered 2023-07-22 – 2023-07-27 (×4): 200 mg via ORAL
  Filled 2023-07-22 (×6): qty 1

## 2023-07-22 MED ORDER — POTASSIUM CHLORIDE CRYS ER 20 MEQ PO TBCR
20.0000 meq | EXTENDED_RELEASE_TABLET | Freq: Once | ORAL | Status: AC
  Administered 2023-07-22: 20 meq via ORAL
  Filled 2023-07-22: qty 1

## 2023-07-22 MED ORDER — LIDOCAINE HCL 1 % IJ SOLN
20.0000 mL | Freq: Once | INTRAMUSCULAR | Status: AC
  Administered 2023-07-22: 5 mL

## 2023-07-22 MED ORDER — LACTATED RINGERS IV BOLUS
500.0000 mL | Freq: Once | INTRAVENOUS | Status: AC
  Administered 2023-07-22: 500 mL via INTRAVENOUS

## 2023-07-22 MED ORDER — CALCIUM GLUCONATE-NACL 1-0.675 GM/50ML-% IV SOLN: 1.0000 g | Freq: Once | INTRAVENOUS | Status: DC

## 2023-07-22 MED ORDER — MIDAZOLAM HCL 2 MG/2ML IJ SOLN
INTRAMUSCULAR | Status: AC
  Filled 2023-07-22: qty 2

## 2023-07-22 MED ORDER — SIMETHICONE 80 MG PO CHEW
80.0000 mg | CHEWABLE_TABLET | Freq: Once | ORAL | Status: AC
  Administered 2023-07-22: 80 mg via ORAL
  Filled 2023-07-22: qty 1

## 2023-07-22 MED ORDER — NOREPINEPHRINE 4 MG/250ML-% IV SOLN
INTRAVENOUS | Status: AC
  Administered 2023-07-22: 10 ug/min via INTRAVENOUS
  Filled 2023-07-22: qty 250

## 2023-07-22 MED ORDER — FENTANYL CITRATE (PF) 100 MCG/2ML IJ SOLN
INTRAMUSCULAR | Status: AC
  Filled 2023-07-22: qty 2

## 2023-07-22 MED ORDER — SODIUM CHLORIDE 0.9% IV SOLUTION: Freq: Once | INTRAVENOUS | Status: AC

## 2023-07-22 MED ORDER — IOHEXOL 300 MG/ML  SOLN
100.0000 mL | Freq: Once | INTRAMUSCULAR | Status: DC | PRN
Start: 2023-07-22 — End: 2023-07-27

## 2023-07-22 MED ORDER — LACTATED RINGERS IV SOLN: INTRAVENOUS | Status: AC

## 2023-07-22 NOTE — Consult Note (Signed)
Chief Complaint: Patient was seen in consultation today for GI bleeding.   at the request of Selmer Dominion NP  Referring Physician(s): Selmer Dominion, NP  Supervising Physician: Oley Balm  Patient Status: [facility] - In-pt  Full Code  History of Present Illness: Peggy Armstrong is a 78 y.o. female with history of a fib-on eliquis, HTN, GERD, GAD, hypothyroidism, RA, diastolic heart failure, prior DVT, duodenal adenoma, and recurrent GI bleed-most recent hospitalization 06/20/23-06/21/23. Patient reported to the ER with c/o bloody diarrhea. Prior to admission, patient had a EGD 1 week prior at [facility]. HGB in the ER as 7.8 and repeat 6.8. Patient received a transfusion and eliquis was placed on hold. CT angio abdomen/pelvis 07/20/23 reported active GI bleeding within the second and third portions of the duodenum, associated with segmental duodenal wall thickening and fat stranding.  GI was consulted and patient underwent EGD 07/21/23 with Dr. Terrilee Croak. Ulceration/bleeding was visualized in the distal duodenum. This area was clipped and purastat was placed. Another ulcer was visualized in the second portion of the duodenum along the anterior wall. Purastat was placed in the area. Purastat was also applied to the previous ampullectomy site where bile drainage was present. Per Dr. Urban Gibson report, if bleeding continued, IR intervention was recommended. IR consult was placed.    Patient is alert and oriented. Patient's granddaughter is present. Patient is able to sign, but is weak. Her granddaughter signed for her today. Patient is NPO and off eliquis. It is the patient's birthday today.    Past Medical History:  Diagnosis Date   Acquired hypothyroidism    Atrial fibrillation (HCC)    Back injury    DVT (deep venous thrombosis) (HCC)    right DVT   GAD (generalized anxiety disorder)    GERD (gastroesophageal reflux disease)    Gout    Gout    Hypertension    RA (rheumatoid arthritis)  (HCC)     Past Surgical History:  Procedure Laterality Date   ABDOMINAL HYSTERECTOMY     BIOPSY  05/16/2023   Procedure: BIOPSY;  Surgeon: Meryl Dare, MD;  Location: [facility];  Service: Gastroenterology;;   CHOLECYSTECTOMY     ENDOSCOPIC RETROGRADE CHOLANGIOPANCREATOGRAPHY (ERCP) WITH PROPOFOL N/A 06/15/2017   Procedure: ENDOSCOPIC RETROGRADE CHOLANGIOPANCREATOGRAPHY (ERCP) WITH PROPOFOL;  Surgeon: Rachael Fee, MD;  Location: [facility];  Service: Endoscopy;  Laterality: N/A;   ESOPHAGOGASTRODUODENOSCOPY (EGD) WITH PROPOFOL N/A 03/24/2017   Procedure: ESOPHAGOGASTRODUODENOSCOPY (EGD) WITH PROPOFOL;  Surgeon: Sherrilyn Rist, MD;  Location: [facility];  Service: Gastroenterology;  Laterality: N/A;   ESOPHAGOGASTRODUODENOSCOPY (EGD) WITH PROPOFOL N/A 11/07/2017   Procedure: ESOPHAGOGASTRODUODENOSCOPY (EGD) WITH PROPOFOL;  Surgeon: Rachael Fee, MD;  Location: [facility];  Service: Endoscopy;  Laterality: N/A;   ESOPHAGOGASTRODUODENOSCOPY (EGD) WITH PROPOFOL N/A 05/16/2023   Procedure: ESOPHAGOGASTRODUODENOSCOPY (EGD) WITH PROPOFOL;  Surgeon: Meryl Dare, MD;  Location: [facility];  Service: Gastroenterology;  Laterality: N/A;   EUS N/A 04/06/2017   Procedure: UPPER ENDOSCOPIC ULTRASOUND (EUS) LINEAR;  Surgeon: Rachael Fee, MD;  Location: [facility];  Service: Endoscopy;  Laterality: N/A;  to evaluate duodenal mass   HERNIA REPAIR     IR BILIARY DRAIN PLACEMENT WITH CHOLANGIOGRAM  03/25/2017   IR CONVERT BILIARY DRAIN TO INT EXT BILIARY DRAIN  04/03/2017   POLYPECTOMY  05/16/2023   Procedure: POLYPECTOMY;  Surgeon: Meryl Dare, MD;  Location: [facility];  Service: Gastroenterology;;    Allergies: Codeine and Tape  Medications: Prior to  Admission medications   Medication Sig Start Date End Date Taking? Authorizing Provider  acetaminophen (TYLENOL) 325 MG tablet Take 650 mg by mouth every 6 (six) hours as needed for mild pain (pain score 1-3) or  moderate pain (pain score 4-6).   Yes [provider]  allopurinol (ZYLOPRIM) 300 MG tablet Take 1 tablet (300 mg total) by mouth daily. 07/18/23  Yes Rothfuss, Teryl Lucy, PA-C  amiodarone (PACERONE) 200 MG tablet Take 1 tablet (200 mg total) by mouth daily. Please call [phone] to schedule cardiology follow up for further refills. 06/22/23  Yes Alver Sorrow, NP  Ascorbic Acid (VITAMIN C CR) 500 MG TBCR Take 500 mg by mouth daily.   Yes [provider]  atorvastatin (LIPITOR) 20 MG tablet Take 1 tablet (20 mg total) by mouth daily. 12/19/22  Yes Alver Sorrow, NP  busPIRone (BUSPAR) 10 MG tablet Take 1 tablet (10 mg total) by mouth 2 (two) times daily. 07/18/23  Yes Rothfuss, Teryl Lucy, PA-C  cholecalciferol (VITAMIN D) 1000 units tablet Take 1,000 Units by mouth daily.   Yes [provider]  cyanocobalamin (VITAMIN B12) 1000 MCG tablet Take 1 tablet by mouth daily.   Yes [provider]  ELIQUIS 5 MG TABS tablet Take 5 mg by mouth 2 (two) times daily. 07/02/23  Yes [provider]  ferrous sulfate 325 (65 FE) MG tablet Take 325 mg by mouth daily with breakfast.   Yes [provider]  folic acid (FOLVITE) 1 MG tablet Take 1 tablet (1 mg total) by mouth daily. 05/18/23  Yes Osvaldo Shipper, MD  furosemide (LASIX) 20 MG tablet Take 1 tablet (20 mg total) by mouth daily. 07/18/23  Yes Rothfuss, Teryl Lucy, PA-C  KLOR-CON M20 20 MEQ tablet Take 20 mEq by mouth daily.   Yes [provider]  levothyroxine (SYNTHROID) 100 MCG tablet Take 1 tablet (100 mcg total) by mouth daily before breakfast. 07/18/23  Yes Rothfuss, Gerilyn Pilgrim T, PA-C  melatonin 3 MG TABS tablet Take 1 tablet (3 mg total) by mouth at bedtime as needed (insomnia). 05/18/23  Yes Osvaldo Shipper, MD  ondansetron (ZOFRAN) 4 MG tablet Take 4 mg by mouth every 6 (six) hours as needed for nausea or vomiting.   Yes [provider]  oxyCODONE (OXY IR/ROXICODONE) 5 MG immediate  release tablet Take 5 mg by mouth as needed for severe pain (pain score 7-10).   Yes [provider]  pantoprazole (PROTONIX) 40 MG tablet Take 1 tablet (40 mg total) by mouth daily. 07/18/23  Yes Rothfuss, Teryl Lucy, PA-C  sertraline (ZOLOFT) 25 MG tablet Take 1 tablet (25 mg total) by mouth daily. 07/18/23  Yes Rothfuss, Teryl Lucy, PA-C  tiZANidine (ZANAFLEX) 4 MG tablet Take 1 tablet (4 mg total) by mouth every 6 (six) hours as needed for muscle spasms. 07/18/23  Yes Rothfuss, Teryl Lucy, PA-C     Family History  Problem Relation Age of Onset   Hypertension Mother    Prostate cancer Father    Colon cancer Father    Hypertension Brother    Rheum arthritis Maternal Grandmother    Diabetes Paternal Grandmother    Heart disease Paternal Grandfather     Social History   Socioeconomic History   Marital status: Divorced    Spouse name: Not on file   Number of children: 2   Years of education: Not on file   Highest education level: Not on file  Occupational History   Not on file  Tobacco Use   Smoking status: Former    Types: Cigarettes    Passive exposure: Past   Smokeless tobacco: Former   Tobacco comments:    Quit in 2012. Started at 20's. Cannot recall how many.   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  Other Topics Concern   Not on file  Social History Narrative   Not on file   Social Drivers of Health   Financial Resource Strain: Low Risk  (12/02/2022)   Received from [facility], [facility]   Overall Financial Resource Strain (CARDIA)    Difficulty of Paying Living Expenses: Not very hard  Food Insecurity: No Food Insecurity (07/20/2023)   Hunger Vital Sign    Worried About Running Out of Food in the Last Year: Never true    Ran Out of Food in the Last Year: Never true  Transportation Needs: No Transportation Needs  (07/20/2023)   PRAPARE - Administrator, Civil Service (Medical): No    Lack of Transportation (Non-Medical): No  Physical Activity: Unknown (04/05/2022)   Received from [facility], [facility]   Exercise Vital Sign    Days of Exercise per Week: 0 days    Minutes of Exercise per Session: Not on file  Stress: No Stress Concern Present (04/05/2022)   Received from [facility], [facility] of Occupational Health - Occupational Stress Questionnaire    Feeling of Stress : Not at all  Social Connections: Socially Isolated (07/20/2023)   Social Connection and Isolation Panel [NHANES]    Frequency of Communication with Friends and Family: More than three times a week    Frequency of Social Gatherings with Friends and Family: Twice a week    Attends Religious Services: Never    Database administrator or Organizations: No    Attends Engineer, structural: Never    Marital Status: Divorced     Review of Systems: A 12 point ROS discussed and pertinent positives are indicated in the HPI above.  All other systems are negative.  Review of Systems  Constitutional:  Positive for fatigue. Negative for fever.  Respiratory:  Negative for shortness of breath.   Cardiovascular:  Negative for chest pain.  Gastrointestinal:  Negative for abdominal pain, nausea and vomiting.  All other systems reviewed and are negative.   Vital Signs: BP 99/71   Pulse (!) 58   Temp (!) 97.5 F (36.4 C) (Axillary)   Resp 18   Ht 5' (1.524 m)   Wt 187 lb 2.7 oz (84.9 kg)   SpO2 99%   BMI 36.55 kg/m   Advance Care Plan: The advanced care plan/surrogate decision maker was discussed at the time of visit and documented in the medical record.    Physical Exam HENT:     Head: Normocephalic.     Mouth/Throat:     Mouth: Mucous membranes are moist.     Pharynx: Oropharynx is clear.  Cardiovascular:     Rate and Rhythm: Normal rate and regular rhythm.  Pulmonary:      Effort: Pulmonary effort is normal. No respiratory distress.     Breath sounds: Normal breath sounds.  Musculoskeletal:     Cervical back: Normal range of motion.  Skin:    General: Skin is warm.  Neurological:  General: No focal deficit present.     Mental Status: She is alert and oriented to person, place, and time.  Psychiatric:        Mood and Affect: Mood normal.        Thought Content: Thought content normal.        Judgment: Judgment normal.     Imaging: CT Angio Abd/Pel W and/or Wo Contrast Result Date: 07/20/2023 CLINICAL DATA:  Dark red blood in stool since this morning, dizziness, anticoagulated EXAM: CTA ABDOMEN AND PELVIS WITHOUT AND WITH CONTRAST TECHNIQUE: Multidetector CT imaging of the abdomen and pelvis was performed using the standard protocol during bolus administration of intravenous contrast. Multiplanar reconstructed images and MIPs were obtained and reviewed to evaluate the vascular anatomy. RADIATION DOSE REDUCTION: This exam was performed according to the departmental dose-optimization program which includes automated exposure control, adjustment of the mA and/or kV according to patient size and/or use of iterative reconstruction technique. CONTRAST:  75mL OMNIPAQUE IOHEXOL 350 MG/ML SOLN COMPARISON:  05/15/2023 FINDINGS: VASCULAR Aorta: Normal caliber aorta without aneurysm, dissection, vasculitis or significant stenosis. Diffuse atherosclerosis. Celiac: Patent without evidence of aneurysm, dissection, vasculitis or significant stenosis. SMA: Patent without evidence of aneurysm, dissection, vasculitis or significant stenosis. 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 Renals: Both renal arteries are patent without evidence of aneurysm, dissection, vasculitis, fibromuscular dysplasia or significant stenosis. Mild atherosclerosis without critical stenosis. IMA: Patent without evidence of aneurysm, dissection, vasculitis or significant  stenosis. Inflow: There is diffuse atherosclerosis throughout the iliac vessels. Focal high-grade stenosis at the origin of the left iliac artery estimated greater than 70%. Remaining vessels are patent. No aneurysm, dissection, or vasculitis. Proximal Outflow: Bilateral common femoral and visualized portions of the superficial and profunda femoral arteries are patent without evidence of aneurysm, dissection, vasculitis or significant stenosis. Diffuse atherosclerosis. Veins: No obvious venous abnormality within the limitations of this arterial phase study. Review of the MIP images confirms the above findings. NON-VASCULAR Lower chest: Trace right pleural fluid.  Moderate hiatal hernia. Hepatobiliary: No focal liver abnormality is seen. Status post cholecystectomy. No biliary dilatation. Pancreas: Unremarkable. No pancreatic ductal dilatation or surrounding inflammatory changes. Spleen: Normal in size without focal abnormality. Adrenals/Urinary Tract: Left renal cortical cyst do not require specific imaging follow-up. There is bilateral renal cortical thinning. No urinary tract calculi or obstructive uropathy. Adrenals and bladder are unremarkable. Stomach/Bowel: No bowel obstruction or ileus. Moderate hiatal hernia. There is abnormal mural thickening involving the second and third portions of the duodenum, with mild adjacent fat stranding. Active gastrointestinal bleeding is identified within this segment of the duodenum. Differential diagnosis would include duodenitis, peptic ulcer disease, or neoplasm. Endoscopy is recommended for further evaluation. Lymphatic: There are several subcentimeter lymph nodes adjacent to the duodenal wall thickening, largest measuring 7 mm reference image 46/5. No pathologic adenopathy within the abdomen or pelvis. Reproductive: Status post hysterectomy. No adnexal masses. Other: Trace pelvic free fluid. No free intraperitoneal gas. No abdominal wall hernia. Musculoskeletal: No acute  or destructive bony abnormalities. Chronic L2 compression deformity with resulting vertebra plana unchanged since prior exam. Healing fracture through the superior margin of the S2 vertebral body, with increased callus formation since prior exam. Reconstructed images demonstrate no additional findings. IMPRESSION: VASCULAR 1. Active gastrointestinal bleeding within the second and third portions of the duodenum, associated with segmental duodenal wall thickening and fat stranding. 2. Aortic Atherosclerosis (ICD10-I70.0). Atherosclerosis throughout the branch vessels as above, with focal high-grade stenosis at the origin of  the left internal iliac artery. NON-VASCULAR 1. Wall thickening of the second and third portion duodenum, with mild surrounding fat stranding and subcentimeter lymph nodes. Differential diagnosis includes duodenitis, peptic ulcer disease, or neoplasm. Endoscopy is recommended for further evaluation. 2. Moderate hiatal hernia. 3. Trace pelvic free fluid. Critical Value/emergent results were called by telephone at the time of interpretation on 07/20/2023 at 4:02 pm to provider Marita Kansas, PA, who verbally acknowledged these results. Electronically Signed   By: Sharlet Salina M.D.   On: 07/20/2023 16:05    Labs:  CBC: Recent Labs    07/18/23 1542 07/20/23 1213 07/20/23 1401 07/21/23 0830 07/21/23 1136 07/21/23 1533 07/21/23 2158 07/22/23 0022 07/22/23 0826 07/22/23 0827  WBC 4.9 8.8  --  10.8*  --   --   --   --  11.4*  --   HGB 10.4* 7.8*   < > 10.3*   < > 9.1* 9.8* 7.4* 10.0*  --   HCT 34.2 26.5*   < > 30.2*   < > 26.9* 29.5* 21.9* 30.6*  --   PLT 116* 150  --  99*  --   --   --   --  161 159   < > = values in this interval not displayed.    COAGS: Recent Labs    05/12/23 0516 05/14/23 1313 07/20/23 1213 07/21/23 0804 07/22/23 0022 07/22/23 0827  INR 1.7* 1.8* 1.6* 1.2  --  1.2  APTT 43* 44*  --   --  29 29    BMP: Recent Labs    06/21/23 0401 07/18/23 1542  07/20/23 1213 07/21/23 0830 07/22/23 0022  NA 137 141 140 137 134*  K 3.5 4.4 4.2 5.1 4.7  CL 107 102 105 105 104  CO2 24 23 23 23 23   GLUCOSE 87 96 147* 114* 120*  BUN 29* 18 26* 40* 42*  CALCIUM 8.0* 8.5* 7.8* 7.7* 7.4*  CREATININE 1.10* 0.93 1.09* 1.40* 1.37*  GFRNONAA 52*  --  52* 39* 40*    LIVER FUNCTION TESTS: Recent Labs    07/18/23 1542 07/20/23 1213 07/21/23 0830 07/22/23 0022  BILITOT 0.3 0.6 1.3* 0.8  AST 48* 42* 29 24  ALT 30 26 22 19   ALKPHOS 160* 99 65 58  PROT 5.7* 4.7* 3.8* 3.4*  ALBUMIN 3.3* 2.4* 2.0* 1.8*    TUMOR MARKERS: No results for input(s): "AFPTM", "CEA", "CA199", "CHROMGRNA" in the last 8760 hours.  Assessment and Plan:  Peggy Armstrong is a 78 y.o. female with history of a fib-on eliquis, HTN, GERD, GAD, hypothyroidism, RA, diastolic heart failure, prior DVT, duodenal adenoma, and recurrent GI bleed-most recent hospitalization 06/20/23-06/21/23. Patient reported to the ER with c/o bloody diarrhea. Prior to admission, patient had a EGD 1 week prior at [facility]. HGB in the ER as 7.8 and repeat 6.8. Patient received a transfusion and eliquis was placed on hold. CT angio abdomen/pelvis 07/20/23 reported active GI bleeding within the second and third portions of the duodenum, associated with segmental duodenal wall thickening and fat stranding.  GI was consulted and patient underwent EGD 07/21/23 with Dr. Terrilee Croak. Ulceration/bleeding was visualized in the distal duodenum. This area was clipped and purastat was placed. Another ulcer was visualized in the second portion of the duodenum along the anterior wall. Purastat was placed in the area. Purastat was also applied to the previous ampullectomy site where bile drainage was present. Per Dr. Urban Gibson report, if bleeding continued, IR intervention was recommended. IR consult was  placed.    Patient is alert and oriented. Patient's granddaughter is present. Patient is able to sign, but is weak. Her granddaughter  signed for her today. Patient is NPO and off eliquis. It is the patient's birthday today.   The Risks and benefits of embolization were discussed with the patient including, but not limited to bleeding, infection, vascular injury, post operative pain, or contrast induced renal failure.  This procedure involves the use of X-rays and because of the nature of the planned procedure, it is possible that we will have prolonged use of X-ray fluoroscopy.  Potential radiation risks to you include (but are not limited to) the following: - A slightly elevated risk for cancer several years later in life. This risk is typically less than 0.5% percent. This risk is low in comparison to the normal incidence of human cancer, which is 33% for women and 50% for men according to the [employer] Society. - Radiation induced injury can include skin redness, resembling a rash, tissue breakdown / ulcers and hair loss (which can be temporary or permanent).   The likelihood of either of these occurring depends on the difficulty of the procedure and whether you are sensitive to radiation due to previous procedures, disease, or genetic conditions.   IF your procedure requires a prolonged use of radiation, you will be notified and given written instructions for further action.  It is your responsibility to monitor the irradiated area for the 2 weeks following the procedure and to notify your physician if you are concerned that you have suffered a radiation induced injury.    All of the patient's questions were answered, patient is agreeable to proceed. Consent signed and in chart.  Thank you for this interesting consult.  I greatly enjoyed meeting Peggy Armstrong and look forward to participating in their care.  A copy of this report was sent to the requesting provider on this date.  Electronically Signed: Rosalita Levan, PA 07/22/2023, 9:45 AM   I spent a total of 20 Minutes    in face to face in clinical  consultation, greater than 50% of which was counseling/coordinating care for GI bleeding.

## 2023-07-22 NOTE — Progress Notes (Signed)
NAME:  ABBEE Armstrong, MRN:  [id number], DOB:  May 01, 1946, LOS: 1 ADMISSION DATE:  07/20/2023, CONSULTATION DATE:  07/21/23 REFERRING MD:  Arlean Hopping , CHIEF COMPLAINT:  UGIB   History of Present Illness:  78 yo F PMH HTN HLD diastolic HF, hx DVT, Afib on eliquis and recurrent GIB, who presented to ED 07/20/23 with bloody diarrhea beginning morning of presentation. 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 Eliquis resumed 4d later.  In ED she initially had borderline soft pressures and was given IVF while awaiting labs and CT angio. H/H 7.8, then 6.8 (with baseline around 11) and was transfused 1 PRBC and given Kcentra. CT anio with active bleeding within second and third portions of duodenum. GI and IR were engaged. Felt too proximal for IR intervention. Seen by GI. At that time she was not having active bloody BM and it was rec for 1 additional PRBC and EGD 1/24 AM with Dr. Chales Abrahams.   1/23-24 night, the patient starting having frequent dark bloody bowel movements. Hgb 7.8 (after 2 PRBC) She became hypotensive and was transfused 4 PRBC (5th pending) and given a second dose of Kcentra. TRH updated GI, and consulted PCCM for transfer to ICU.    Pertinent  Medical History  GIB Afib on eliquis DVT 2017 dHF HTN HLD  Hypothyroidism GERD Ampullary adenoma s/p ampullectomy 2019 [facility] Duodenal adenoma esophagitits  Significant Hospital Events: Including procedures, antibiotic start and stop dates in addition to other pertinent events   1/14 incomplete mucosal resection of duodenal adenoma at [facility]. 1/18 eliquis resumed 1/23 to ED w dark bloody BM, CT angio with active extrav. admit to [facility]. Kcentra and 2 PRBC. GI consult 1/24 incr output. Hypotensive. 2nd dose K Centra. Additional 5 PRBC. PCCM consult.  EGD w/ GI> duodenal ulcer clipped  Interim History / Subjective:  No complaints, feels better today Hgb drop overnight 9.8> 7.4 with  dk bloody stool output transfused 1u, LR given, and started on peripheral NE, currently on 8 mcg [birth year]  Objective   Blood pressure (!) 110/55, pulse (!) 57, temperature (!) 97.5 F (36.4 C), temperature source Axillary, resp. rate 18, height 5' (1.524 m), weight 84.9 kg, SpO2 96%.        Intake/Output Summary (Last 24 hours) at 07/22/2023 0834 Last data filed at 07/22/2023 0630 Gross per 24 hour  Intake 3070.67 ml  Output 875 ml  Net 2195.67 ml   Filed Weights   07/21/23 0456 07/21/23 0858 07/22/23 0254  Weight: 84.6 kg 84.6 kg 84.9 kg    Examination: General:  AoC ill appearing elderly female sitting upright in bed in NAD HEENT: MM pale/ dry Neuro:  Alert, oriented x 3, MAE- generalized weakness CV: rr, SR, no murmur PULM:  non labored, clear anteriorly GI: soft, bs+, NT/ ND, purwick, rectal pouch with dark bloody stools ~50 Extremities: warm/dry, no tibial edema  Skin: no rashes   Labs around midnight> Hgb 9.8> 7.4, Na 134, K 4.7, BUN/ sCr 40/ 1.4> 42/1.37, Mag 2.2, t. 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  If rebleeding, recs for IR embolization - given drop in Hgb overnight with additional dk bloody stools and continued vasopressor ongoing this am> IR consulted for embolization - discussed CVL placement and ongoing GOC> pt ok if needed and remains full code  - will transfuse additional PRBC, plts and FFP given s/p cumulative 6u PRBC without other products.  Recheck CBC and DIC panel pending - cont PPI BID - CBC now and trend, transfuse for  Hgb < 7 or significant blood loss - will also need further f/u at [facility] for remaining duodenal resection.  GI to touch base w/ [facility] GI - cont to hold [facility] indefinitely pending further plans for resection   AKI due to hypoperfusion, s/p IV contrast  - stable sCr overnight, making more urine.  Bicarb ok 23, doubt contributing to hypotension - continue to support hemodynamics - LR at 75 ml while NPO - monitor UOP with purwick, unmeasured urinary occurrences 1/24, I/Os inaccurate  - repeat BMET at 1600 - Trend BMP daily / urinary output/ strict I/Os - Replace electrolytes as indicated - Avoid nephrotoxic agents, ensure adequate renal perfusion   Hx Afib Chronic anticoagulation - last dose eliquis 1/22 s/p Kcentra 1/23 and 1/24 P:  - remains in NSR, monitor on tele - resume amio when taking PO - optimize lytes - cont to hold eliquis> timing to restart to be deferred to GI as will need further duodenal resection.  Will need cardiology input when more stable given AC to hold indefinitely for now pending further GI plans/ need for resection.  Question if candidate for watchmans device    Hx HTN HLD Hx rHFpEF - 05/2023 TTE LVEF 55-60%, normal diastolic, normal RV, mildly elevated RVSP 42.7 P:  - cont to hold home lasix.  At risk for overload given multiple blood products but no signs of overload, looks dry today, clear lungs/ RA, dry mucous membranes, stable AKI - hold lipitor  Elevated LFTs, mild and improving - LFTs/ t. bili normalized.   Hypothyroidism - resume synthroid when taking PO  Depression/ anxiety - resume zoloft/ buspar when taking POs  Debility, chronic  Hx lumbar fx/ chronic pain - d/c'd from rehab early Jan.  Uses wheelchair, walker at home - PT/ OT - tylenol prn, rarely uses oxy at home per pt, gets more benefit from tylenol/ muscle relaxer's  - would avoid home zanaflex for now given hypotension, or if needed, reduced dose.  Given overnight, likely contributed some  to hypotension  Best Practice (right click and "Reselect all SmartList Selections" daily)   Diet/type: NPO DVT prophylaxis SCD Pressure ulcer(s): pressure ulcer assessment deferred .  Lines: N/A Foley:  N/A Code Status:  full code Last date of multidisciplinary goals of care discussion [1/25 ]  Granddaughter updated w/ pt at bedside on plan of care  Labs   CBC: Recent Labs  Lab 07/18/23 1542 07/20/23 1213 07/20/23 1401 07/21/23 0830 07/21/23 1136 07/21/23 1533 07/21/23 2158 07/22/23 0022  WBC 4.9 8.8  --  10.8*  --   --   --   --   NEUTROABS 3.8 7.1  --   --   --   --   --   --   HGB 10.4* 7.8*   < > 10.3* 9.9* 9.1* 9.8* 7.4*  HCT 34.2 26.5*   < > 30.2* 29.1* 26.9* 29.5* 21.9*  MCV 97 101.5*  --  87.0  --   --   --   --  PLT 116* 150  --  99*  --   --   --   --    < > = values in this interval not displayed.    Basic Metabolic Panel: Recent Labs  Lab 07/18/23 1542 07/20/23 1213 07/21/23 0755 07/21/23 0830 07/22/23 0022  NA 141 140  --  137 134*  K 4.4 4.2  --  5.1 4.7  CL 102 105  --  105 104  CO2 23 23  --  23 23  GLUCOSE 96 147*  --  114* 120*  BUN 18 26*  --  40* 42*  CREATININE 0.93 1.09*  --  1.40* 1.37*  CALCIUM 8.5* 7.8*  --  7.7* 7.4*  MG  --   --  1.2*  --  2.2  PHOS  --   --   --   --  4.7*   GFR: Estimated Creatinine Clearance: 32.8 mL/min (A) (by C-G formula based on SCr of 1.37 mg/dL (H)). Recent Labs  Lab 07/18/23 1542 07/20/23 1213 07/21/23 0755 07/21/23 0830  WBC 4.9 8.8  --  10.8*  LATICACIDVEN  --   --  1.4  --     Liver Function Tests: Recent Labs  Lab 07/18/23 1542 07/20/23 1213 07/21/23 0830 07/22/23 0022  AST 48* 42* 29 24  ALT 30 26 22 19   ALKPHOS 160* 99 65 58  BILITOT 0.3 0.6 1.3* 0.8  PROT 5.7* 4.7* 3.8* 3.4*  ALBUMIN 3.3* 2.4* 2.0* 1.8*   No results for input(s): "LIPASE", "AMYLASE" in the last 168 hours. No results for input(s): "AMMONIA" in the last 168 hours.  ABG No results found for: "PHART",  "PCO2ART", "PO2ART", "HCO3", "TCO2", "ACIDBASEDEF", "O2SAT"   Coagulation Profile: Recent Labs  Lab 07/20/23 1213 07/21/23 0804  INR 1.6* 1.2    Cardiac Enzymes: No results for input(s): "CKTOTAL", "CKMB", "CKMBINDEX", "TROPONINI" in the last 168 hours.  HbA1C: Hgb A1c MFr Bld  Date/Time Value Ref Range Status  07/18/2023 03:42 PM 4.3 (L) 4.8 - 5.6 % Final    Comment:             Prediabetes: 5.7 - 6.4          Diabetes: >6.4          Glycemic control for adults with diabetes: <7.0     CBG: Recent Labs  Lab 07/21/23 1556 07/21/23 1925 07/21/23 2320 07/22/23 0324 07/22/23 0710  GLUCAP 113* 117* 107* 133* 122*    Allergies Allergies  Allergen Reactions   Codeine Nausea And Vomiting    Made "very sick"   Tape      Home Medications  Prior to Admission medications   Medication Sig Start Date End Date Taking? Authorizing Provider  acetaminophen (TYLENOL) 325 MG tablet Take 650 mg by mouth every 6 (six) hours as needed for mild pain (pain score 1-3) or moderate pain (pain score 4-6).   Yes [provider]  allopurinol (ZYLOPRIM) 300 MG tablet Take 1 tablet (300 mg total) by mouth daily. 07/18/23  Yes Rothfuss, Teryl Lucy, PA-C  amiodarone (PACERONE) 200 MG tablet Take 1 tablet (200 mg total) by mouth daily. Please call [phone] to schedule cardiology follow up for further refills. 06/22/23  Yes Alver Sorrow, NP  Ascorbic Acid (VITAMIN C CR) 500 MG TBCR Take 500 mg by mouth daily.   Yes [provider]  atorvastatin (LIPITOR) 20 MG tablet Take 1 tablet (20 mg total) by mouth daily. 12/19/22  Yes Alver Sorrow,  NP  busPIRone (BUSPAR) 10 MG tablet Take 1 tablet (10 mg total) by mouth 2 (two) times daily. 07/18/23  Yes Rothfuss, Teryl Lucy, PA-C  cholecalciferol (VITAMIN D) 1000 units tablet Take 1,000 Units by mouth daily.   Yes [provider]  cyanocobalamin (VITAMIN B12) 1000 MCG tablet Take 1 tablet by mouth daily.   Yes [provider]  ELIQUIS 5 MG TABS tablet Take 5 mg by mouth 2 (two) times daily. 07/02/23  Yes [provider]  ferrous sulfate 325 (65 FE) MG tablet Take 325 mg by mouth daily with breakfast.   Yes [provider]  folic acid (FOLVITE) 1 MG tablet Take 1 tablet (1 mg total) by mouth daily. 05/18/23  Yes Osvaldo Shipper, MD  furosemide (LASIX) 20 MG tablet Take 1 tablet (20 mg total) by mouth daily. 07/18/23  Yes Rothfuss, Teryl Lucy, PA-C  KLOR-CON M20 20 MEQ tablet Take 20 mEq by mouth daily.   Yes [provider]  levothyroxine (SYNTHROID) 100 MCG tablet Take 1 tablet (100 mcg total) by mouth daily before breakfast. 07/18/23  Yes Rothfuss, Gerilyn Pilgrim T, PA-C  melatonin 3 MG TABS tablet Take 1 tablet (3 mg total) by mouth at bedtime as needed (insomnia). 05/18/23  Yes Osvaldo Shipper, MD  ondansetron (ZOFRAN) 4 MG tablet Take 4 mg by mouth every 6 (six) hours as needed for nausea or vomiting.   Yes [provider]  oxyCODONE (OXY IR/ROXICODONE) 5 MG immediate release tablet Take 5 mg by mouth as needed for severe pain (pain score 7-10).   Yes [provider]  pantoprazole (PROTONIX) 40 MG tablet Take 1 tablet (40 mg total) by mouth daily. 07/18/23  Yes Rothfuss, Teryl Lucy, PA-C  sertraline (ZOLOFT) 25 MG tablet Take 1 tablet (25 mg total) by mouth daily. 07/18/23  Yes Rothfuss, Teryl Lucy, PA-C  tiZANidine (ZANAFLEX) 4 MG tablet Take 1 tablet (4 mg total) by mouth every 6 (six) hours as needed for muscle spasms. 07/18/23  Yes Rothfuss, Teryl Lucy, PA-C     Critical care time: 38 min         Posey Boyer, MSN, AG-ACNP-BC [facility] 07/22/2023, 8:34 AM  See Amion for pager If no response to pager , please call [phone] until 7pm After 7:00 pm call Elink  [phone]

## 2023-07-22 NOTE — Progress Notes (Signed)
PT Cancellation Note  Patient Details Name: Peggy Armstrong MRN: [id number] DOB: February 09, 1946   Cancelled Treatment:    Reason Eval/Treat Not Completed: Patient not medically ready  Currently on bed rest after procedure. RN reports some bleeding at puncture site that needed to be controlled. Check back tomorrow.   Will follow-up for PT evaluation tomorrow as schedule permits and patient is able/appropriate.  Kathlyn Sacramento, PT, DPT [facility] Physical Therapist Office: [phone] Website: [email or web address]   Berton Mount 07/22/2023, 4:35 PM

## 2023-07-22 NOTE — Sedation Documentation (Signed)
Coiling 57mmX4mm

## 2023-07-22 NOTE — Procedures (Signed)
  Procedure:  Mesenteric angiogram and gastroduodenal artery branch embo x3  no active extrav identified Preprocedure diagnosis: The encounter diagnosis was Acute GI bleeding. Postprocedure diagnosis: same EBL:    minimal Complications:   none immediate  See full dictation in [employer].  Thora Lance MD Main # [phone] Pager  [phone] Mobile [phone]

## 2023-07-22 NOTE — Sedation Documentation (Signed)
Total coiling 5 two different vessels

## 2023-07-22 NOTE — Progress Notes (Addendum)
Patient ID: Peggy Armstrong, female   DOB: 10-19-1945, 78 y.o.   MRN: [id number]    Progress Note   Subjective   Day # 3 CC: acute major Gi bleed  EGD yesterday medium hiatal hernia, multiple sessile gastric polyps, residual tumor was noted in the second portion of the duodenum, partially resected, 3 distinct ulcers found 1 over the area of previous ampullectomy.  Stat applied, second ulcer in the distal duodenum with visible vessel and evidence of recent bleeding/clot clot removed and clip placed in purastat, another small ulcer proximal second portion of the duodenum due to along the anterior wall with small clot clot removed purastat applied.  Unfortunately patient had evidence of rebleeding last night with hypotension, and hemoglobin dropped to 7.4.  Had about 300 cc of dark red blood in her rectal pouch bag earlier this morning and an additional 200 cc since 7 AM Patient says she feels fine she is not having any abdominal pain, had a good sleep last night.  Family at bedside  Pressors started  transfused additional packed RBCs and FFP-hemoglobin up to 10  IR has been consulted and plan to take her for IR embolization this morning     Objective   Vital signs in last 24 hours: Temp:  [97.5 F (36.4 C)-98.5 F (36.9 C)] 97.5 F (36.4 C) (01/25 0713) Pulse Rate:  [51-85] 58 (01/25 0845) Resp:  [6-29] 18 (01/25 0845) BP: (50-135)/(27-107) 99/71 (01/25 0845) SpO2:  [85 %-100 %] 99 % (01/25 0845) Weight:  [84.9 kg] 84.9 kg (01/25 0254) Last BM Date : 07/21/23 General: Elderly white female in NAD Heart:  Regular rate and rhythm; no murmurs Lungs: Respirations even and unlabored, lungs CTA bilaterally Abdomen:  Soft, obese, nontender and nondistended. Normal bowel sounds.-Rectal tube with dark red blood Extremities:  Without edema. Neurologic:  Alert and oriented,  grossly normal neurologically. Psych:  Cooperative. Normal mood and affect.  Intake/Output from previous  day: 01/24 0701 - 01/25 0700 In: 3070.7 [I.V.:1208.5; Blood:286; IV Piggyback:1576.2] Out: 875 [Urine:575; Stool:300] Intake/Output this shift: No intake/output data recorded.  Lab Results: Recent Labs    07/20/23 1213 07/20/23 1401 07/21/23 0830 07/21/23 1136 07/21/23 2158 07/22/23 0022 07/22/23 0826 07/22/23 0827  WBC 8.8  --  10.8*  --   --   --  11.4*  --   HGB 7.8*   < > 10.3*   < > 9.8* 7.4* 10.0*  --   HCT 26.5*   < > 30.2*   < > 29.5* 21.9* 30.6*  --   PLT 150  --  99*  --   --   --  161 159   < > = values in this interval not displayed.   BMET Recent Labs    07/20/23 1213 07/21/23 0830 07/22/23 0022  NA 140 137 134*  K 4.2 5.1 4.7  CL 105 105 104  CO2 23 23 23   GLUCOSE 147* 114* 120*  BUN 26* 40* 42*  CREATININE 1.09* 1.40* 1.37*  CALCIUM 7.8* 7.7* 7.4*   LFT Recent Labs    07/22/23 0022  PROT 3.4*  ALBUMIN 1.8*  AST 24  ALT 19  ALKPHOS 58  BILITOT 0.8  BILIDIR 0.2  IBILI 0.6   PT/INR Recent Labs    07/21/23 0804 07/22/23 0827  LABPROT 15.7* 15.2  INR 1.2 1.2    Studies/Results: CT Angio Abd/Pel W and/or Wo Contrast Result Date: 07/20/2023 CLINICAL DATA:  Dark red blood in stool since this morning,  dizziness, anticoagulated EXAM: CTA ABDOMEN AND PELVIS WITHOUT AND WITH CONTRAST TECHNIQUE: Multidetector CT imaging of the abdomen and pelvis was performed using the standard protocol during bolus administration of intravenous contrast. Multiplanar reconstructed images and MIPs were obtained and reviewed to evaluate the vascular anatomy. RADIATION DOSE REDUCTION: This exam was performed according to the departmental dose-optimization program which includes automated exposure control, adjustment of the mA and/or kV according to patient size and/or use of iterative reconstruction technique. CONTRAST:  75mL OMNIPAQUE IOHEXOL 350 MG/ML SOLN COMPARISON:  05/15/2023 FINDINGS: VASCULAR Aorta: Normal caliber aorta without aneurysm, dissection, vasculitis or  significant stenosis. Diffuse atherosclerosis. Celiac: Patent without evidence of aneurysm, dissection, vasculitis or significant stenosis. SMA: Patent without evidence of aneurysm, dissection, vasculitis or significant stenosis. 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 Renals: Both renal arteries are patent without evidence of aneurysm, dissection, vasculitis, fibromuscular dysplasia or significant stenosis. Mild atherosclerosis without critical stenosis. IMA: Patent without evidence of aneurysm, dissection, vasculitis or significant stenosis. Inflow: There is diffuse atherosclerosis throughout the iliac vessels. Focal high-grade stenosis at the origin of the left iliac artery estimated greater than 70%. Remaining vessels are patent. No aneurysm, dissection, or vasculitis. Proximal Outflow: Bilateral common femoral and visualized portions of the superficial and profunda femoral arteries are patent without evidence of aneurysm, dissection, vasculitis or significant stenosis. Diffuse atherosclerosis. Veins: No obvious venous abnormality within the limitations of this arterial phase study. Review of the MIP images confirms the above findings. NON-VASCULAR Lower chest: Trace right pleural fluid.  Moderate hiatal hernia. Hepatobiliary: No focal liver abnormality is seen. Status post cholecystectomy. No biliary dilatation. Pancreas: Unremarkable. No pancreatic ductal dilatation or surrounding inflammatory changes. Spleen: Normal in size without focal abnormality. Adrenals/Urinary Tract: Left renal cortical cyst do not require specific imaging follow-up. There is bilateral renal cortical thinning. No urinary tract calculi or obstructive uropathy. Adrenals and bladder are unremarkable. Stomach/Bowel: No bowel obstruction or ileus. Moderate hiatal hernia. There is abnormal mural thickening involving the second and third portions of the duodenum, with mild adjacent fat stranding. Active  gastrointestinal bleeding is identified within this segment of the duodenum. Differential diagnosis would include duodenitis, peptic ulcer disease, or neoplasm. Endoscopy is recommended for further evaluation. Lymphatic: There are several subcentimeter lymph nodes adjacent to the duodenal wall thickening, largest measuring 7 mm reference image 46/5. No pathologic adenopathy within the abdomen or pelvis. Reproductive: Status post hysterectomy. No adnexal masses. Other: Trace pelvic free fluid. No free intraperitoneal gas. No abdominal wall hernia. Musculoskeletal: No acute or destructive bony abnormalities. Chronic L2 compression deformity with resulting vertebra plana unchanged since prior exam. Healing fracture through the superior margin of the S2 vertebral body, with increased callus formation since prior exam. Reconstructed images demonstrate no additional findings. IMPRESSION: VASCULAR 1. Active gastrointestinal bleeding within the second and third portions of the duodenum, associated with segmental duodenal wall thickening and fat stranding. 2. Aortic Atherosclerosis (ICD10-I70.0). Atherosclerosis throughout the branch vessels as above, with focal high-grade stenosis at the origin of the left internal iliac artery. NON-VASCULAR 1. Wall thickening of the second and third portion duodenum, with mild surrounding fat stranding and subcentimeter lymph nodes. Differential diagnosis includes duodenitis, peptic ulcer disease, or neoplasm. Endoscopy is recommended for further evaluation. 2. Moderate hiatal hernia. 3. Trace pelvic free fluid. Critical Value/emergent results were called by telephone at the time of interpretation on 07/20/2023 at 4:02 pm to provider Marita Kansas, PA, who verbally acknowledged these results. Electronically Signed   By: Casimiro Needle  Manson Passey M.D.   On: 07/20/2023 16:05       Assessment / Plan:    #68 78 year old white female acute major upper GI bleed status post EMR of a large multilobulated  duodenal polyp at [facility] on 07/11/2023-resumed Eliquis 4 days later.  At onset of bleeding 07/20/2023.  She has required 2 doses of Kcentra and has now had a total of 6 to 7 units of packed RBCs  Hemostasis was achieved with EGD yesterday but still felt to be high risk. Unfortunately she had further active bleeding during the night with hypotension has required additional packed RBCs and pressor support Currently has about 200 cc of dark red blood in the rectal bag   #2 anemia acute on chronic secondary to acute blood loss #3 acute kidney injury #4 history of atrial fibrillation on chronic anticoagulation/Eliquis #5 congestive heart failure with preserved EF  Plan; IR seen patient currently with plans to take her for IR embolization Continue aggressive supportive measurements, transfusions as indicated IV PPI twice daily Plan to leave off Eliquis in the short-term until we have an idea of further plans per Dr. Glena Norfolk for removal of the residual large duodenal adenoma. Appreciate critical care and IR help with this patient  Principal Problem:   GI bleed Active Problems:   Hemorrhagic shock (HCC)   ABLA (acute blood loss anemia)   Acute GI bleeding   AKI (acute kidney injury) (HCC)     LOS: 1 day   Amy EsterwoodPA-C  07/22/2023, 9:43 AM     Attending physician's note   I have taken history, reviewed the chart and examined the patient. I performed a substantive portion of this encounter, including complete performance of at least one of the key components, in conjunction with the APP. I agree with the Advanced Practitioner's note, impression and recommendations.   Post EMR bleeding (7U, 1U FFP) in setting of eliquis s/p endo Rx, GDA embolization x 3 today.  Plan: -Clear liq diet -Continue to trend CBC. Keep Hb>7 -Continue IV Protonix -Continue to hold Eliquis. -D/W family. Have messaged Dr Jamse Mead.    Edman Circle, MD [facility] [phone]

## 2023-07-22 NOTE — Progress Notes (Signed)
eLink Physician-Brief Progress Note Patient Name: Peggy Armstrong DOB: December 10, 1945 MRN: [id number]   Date of Service  07/22/2023  HPI/Events of Note  Hemoglobin dropped from 9.8 to 7.4 within a 3 hour span, there was associated hypotension, although no outwardly signs of ongoing bleeding it is highly probable, given the context.  eICU Interventions  Will order one unit of PRBC transfused.        Peggy Armstrong 07/22/2023, 2:27 AM

## 2023-07-22 NOTE — Sedation Documentation (Signed)
Total of 6 coiling's placed

## 2023-07-23 DIAGNOSIS — R571 Hypovolemic shock: Secondary | ICD-10-CM | POA: Diagnosis not present

## 2023-07-23 DIAGNOSIS — D62 Acute posthemorrhagic anemia: Secondary | ICD-10-CM | POA: Diagnosis not present

## 2023-07-23 DIAGNOSIS — K922 Gastrointestinal hemorrhage, unspecified: Secondary | ICD-10-CM | POA: Diagnosis not present

## 2023-07-23 LAB — TYPE AND SCREEN
ABO/RH(D): O POS
Antibody Screen: NEGATIVE
Unit division: 0
Unit division: 0
Unit division: 0
Unit division: 0
Unit division: 0
Unit division: 0
Unit division: 0

## 2023-07-23 LAB — GLUCOSE, CAPILLARY
Glucose-Capillary: 73 mg/dL (ref 70–99)
Glucose-Capillary: 81 mg/dL (ref 70–99)
Glucose-Capillary: 82 mg/dL (ref 70–99)
Glucose-Capillary: 83 mg/dL (ref 70–99)
Glucose-Capillary: 86 mg/dL (ref 70–99)

## 2023-07-23 LAB — BPAM RBC
Blood Product Expiration Date: 202502132359
Blood Product Expiration Date: 202502152359
Blood Product Expiration Date: 202502152359
Blood Product Expiration Date: 202502152359
Blood Product Expiration Date: 202502152359
Blood Product Expiration Date: 202502152359
Blood Product Expiration Date: 202502162359
ISSUE DATE / TIME: 202501231514
ISSUE DATE / TIME: 202501231812
ISSUE DATE / TIME: 202501240104
ISSUE DATE / TIME: 202501240151
ISSUE DATE / TIME: 202501240151
ISSUE DATE / TIME: 202501250242
ISSUE DATE / TIME: 202501251021
Unit Type and Rh: 5100
Unit Type and Rh: 5100
Unit Type and Rh: 5100
Unit Type and Rh: 5100
Unit Type and Rh: 5100
Unit Type and Rh: 5100
Unit Type and Rh: 5100

## 2023-07-23 LAB — CBC
HCT: 27.4 % — ABNORMAL LOW (ref 36.0–46.0)
Hemoglobin: 9 g/dL — ABNORMAL LOW (ref 12.0–15.0)
MCH: 30.8 pg (ref 26.0–34.0)
MCHC: 32.8 g/dL (ref 30.0–36.0)
MCV: 93.8 fL (ref 80.0–100.0)
Platelets: UNDETERMINED 10*3/uL (ref 150–400)
RBC: 2.92 MIL/uL — ABNORMAL LOW (ref 3.87–5.11)
RDW: 15.6 % — ABNORMAL HIGH (ref 11.5–15.5)
WBC: 5.8 10*3/uL (ref 4.0–10.5)
nRBC: 0 % (ref 0.0–0.2)

## 2023-07-23 LAB — BPAM FFP
Blood Product Expiration Date: 202501262359
ISSUE DATE / TIME: 202501250931
Unit Type and Rh: 6200

## 2023-07-23 LAB — BASIC METABOLIC PANEL
Anion gap: 6 (ref 5–15)
BUN: 26 mg/dL — ABNORMAL HIGH (ref 8–23)
CO2: 24 mmol/L (ref 22–32)
Calcium: 7.8 mg/dL — ABNORMAL LOW (ref 8.9–10.3)
Chloride: 106 mmol/L (ref 98–111)
Creatinine, Ser: 1.12 mg/dL — ABNORMAL HIGH (ref 0.44–1.00)
GFR, Estimated: 50 mL/min — ABNORMAL LOW (ref >60)
Glucose, Bld: 87 mg/dL (ref 70–99)
Potassium: 3.8 mmol/L (ref 3.5–5.1)
Sodium: 136 mmol/L (ref 135–145)

## 2023-07-23 LAB — PREPARE FRESH FROZEN PLASMA

## 2023-07-23 LAB — CBC WITH DIFFERENTIAL/PLATELET
Abs Immature Granulocytes: 0.05 10*3/uL (ref 0.00–0.07)
Basophils Absolute: 0 10*3/uL (ref 0.0–0.1)
Basophils Relative: 0 %
Eosinophils Absolute: 0.2 10*3/uL (ref 0.0–0.5)
Eosinophils Relative: 3 %
HCT: 26.1 % — ABNORMAL LOW (ref 36.0–46.0)
Hemoglobin: 8.7 g/dL — ABNORMAL LOW (ref 12.0–15.0)
Immature Granulocytes: 1 %
Lymphocytes Relative: 9 %
Lymphs Abs: 0.5 10*3/uL — ABNORMAL LOW (ref 0.7–4.0)
MCH: 30.7 pg (ref 26.0–34.0)
MCHC: 33.3 g/dL (ref 30.0–36.0)
MCV: 92.2 fL (ref 80.0–100.0)
Monocytes Absolute: 0.5 10*3/uL (ref 0.1–1.0)
Monocytes Relative: 9 %
Neutro Abs: 4.5 10*3/uL (ref 1.7–7.7)
Neutrophils Relative %: 78 %
Platelets: 71 10*3/uL — ABNORMAL LOW (ref 150–400)
RBC: 2.83 MIL/uL — ABNORMAL LOW (ref 3.87–5.11)
RDW: 15.6 % — ABNORMAL HIGH (ref 11.5–15.5)
WBC: 5.7 10*3/uL (ref 4.0–10.5)
nRBC: 0 % (ref 0.0–0.2)

## 2023-07-23 LAB — MAGNESIUM: Magnesium: 2 mg/dL (ref 1.7–2.4)

## 2023-07-23 LAB — BPAM PLATELET PHERESIS
Blood Product Expiration Date: 202501272359
ISSUE DATE / TIME: 202501250930
Unit Type and Rh: 6200

## 2023-07-23 LAB — PREPARE PLATELET PHERESIS: Unit division: 0

## 2023-07-23 LAB — PROCALCITONIN: Procalcitonin: <0.1 ng/mL

## 2023-07-23 LAB — LACTIC ACID, PLASMA: Lactic Acid, Venous: 0.8 mmol/L (ref 0.5–1.9)

## 2023-07-23 MED ORDER — FUROSEMIDE 10 MG/ML IJ SOLN
20.0000 mg | Freq: Once | INTRAMUSCULAR | Status: AC
  Administered 2023-07-23: 20 mg via INTRAVENOUS
  Filled 2023-07-23: qty 2

## 2023-07-23 MED ORDER — LEVOTHYROXINE SODIUM 100 MCG PO TABS
100.0000 ug | ORAL_TABLET | Freq: Every day | ORAL | Status: DC
  Administered 2023-07-23 – 2023-07-27 (×5): 100 ug via ORAL
  Filled 2023-07-23 (×5): qty 1

## 2023-07-23 MED ORDER — ATORVASTATIN CALCIUM 10 MG PO TABS
20.0000 mg | ORAL_TABLET | Freq: Every day | ORAL | Status: DC
  Administered 2023-07-23 – 2023-07-27 (×5): 20 mg via ORAL
  Filled 2023-07-23 (×5): qty 2

## 2023-07-23 MED ORDER — ACETAMINOPHEN 500 MG PO TABS
1000.0000 mg | ORAL_TABLET | Freq: Four times a day (QID) | ORAL | Status: DC | PRN
  Administered 2023-07-23 – 2023-07-25 (×5): 1000 mg via ORAL
  Filled 2023-07-23 (×5): qty 2

## 2023-07-23 MED ORDER — SERTRALINE HCL 50 MG PO TABS
25.0000 mg | ORAL_TABLET | Freq: Every day | ORAL | Status: DC
  Administered 2023-07-23 – 2023-07-27 (×5): 25 mg via ORAL
  Filled 2023-07-23 (×5): qty 1

## 2023-07-23 MED ORDER — SIMETHICONE 80 MG PO CHEW
80.0000 mg | CHEWABLE_TABLET | Freq: Four times a day (QID) | ORAL | Status: DC | PRN
  Administered 2023-07-23: 80 mg via ORAL
  Filled 2023-07-23: qty 1

## 2023-07-23 NOTE — Progress Notes (Signed)
Chief Complaint: Patient was seen today for GI bleed  Supervising Physician: Oley Balm  Patient Status: [facility] - In-pt  Subjective: S/p angiogram with coil embo GDA branches X 3 Pt feeling much better today. Family at bedside  Decreased pain. Tolerating clears diet. Getting ready to get OOB and work with PT.  Objective: Physical Exam: BP 135/63   Pulse 64   Temp 98.9 F (37.2 C) (Axillary)   Resp (!) 22   Ht 5' (1.524 m)   Wt 187 lb 9.8 oz (85.1 kg)   SpO2 99%   BMI 36.64 kg/m  Abd: soft, NT Ext: (R)groin soft, NT, no hematoma Distal pulses palpable, brisk cap refill   Current Facility-Administered Medications:    amiodarone (PACERONE) tablet 200 mg, 200 mg, Oral, Daily, Selmer Dominion B, NP, 200 mg at 07/23/23 0958   atorvastatin (LIPITOR) tablet 20 mg, 20 mg, Oral, Daily, Selmer Dominion B, NP, 20 mg at 07/23/23 0958   busPIRone (BUSPAR) tablet 10 mg, 10 mg, Oral, BID, Oretha Milch, MD, 10 mg at 07/23/23 1308   Chlorhexidine Gluconate Cloth 2 % PADS 6 each, 6 each, Topical, Daily, Albustami, Flonnie Hailstone, MD, 6 each at 07/21/23 2200   furosemide (LASIX) injection 20 mg, 20 mg, Intravenous, Once, Oretha Milch, MD   iohexol (OMNIPAQUE) 300 MG/ML solution 100 mL, 100 mL, Intra-arterial, Once PRN, Oley Balm, MD   levothyroxine (SYNTHROID) tablet 100 mcg, 100 mcg, Oral, Q0600, Selmer Dominion B, NP, 100 mcg at 07/23/23 0959   melatonin tablet 3 mg, 3 mg, Oral, QHS PRN, Migdalia Dk, MD, 3 mg at 07/22/23 2039   ondansetron (ZOFRAN) tablet 4 mg, 4 mg, Oral, Q6H PRN **OR** ondansetron (ZOFRAN) injection 4 mg, 4 mg, Intravenous, Q6H PRN, Hanley Ben, Kshitiz, MD, 4 mg at 07/22/23 1700   Oral care mouth rinse, 15 mL, Mouth Rinse, PRN, Albustami, Flonnie Hailstone, MD   [COMPLETED] pantoprazole (PROTONIX) injection 40 mg, 40 mg, Intravenous, Q5 min, 40 mg at 07/20/23 1630 **FOLLOWED BY** pantoprazole (PROTONIX) injection 40 mg, 40 mg, Intravenous, Q12H, Alekh, Kshitiz, MD, 40 mg at  07/23/23 0958   sertraline (ZOLOFT) tablet 25 mg, 25 mg, Oral, Daily, Selmer Dominion B, NP, 25 mg at 07/23/23 0959   simethicone (MYLICON) chewable tablet 80 mg, 80 mg, Oral, QID PRN, Carilyn Goodpasture, MD, 80 mg at 07/23/23 0326  Labs: CBC Recent Labs    07/22/23 0826 07/22/23 0827 07/22/23 1553 07/22/23 2208 07/23/23 0913  WBC 11.4*  --   --   --  5.7  HGB 10.0*  --    < > 9.9* 8.7*  HCT 30.6*  --    < > 28.9* 26.1*  PLT 161 159  --   --  71*   < > = values in this interval not displayed.   BMET Recent Labs    07/22/23 1553 07/23/23 0913  NA 138 136  K 3.7 3.8  CL 106 106  CO2 24 24  GLUCOSE 107* 87  BUN 35* 26*  CREATININE 1.17* 1.12*  CALCIUM 7.9* 7.8*   LFT Recent Labs    07/22/23 0022  PROT 3.4*  ALBUMIN 1.8*  AST 24  ALT 19  ALKPHOS 58  BILITOT 0.8  BILIDIR 0.2  IBILI 0.6   PT/INR Recent Labs    07/21/23 0804 07/22/23 0827  LABPROT 15.7* 15.2  INR 1.2 1.2     Studies/Results: IR EMBO ART  VEN HEMORR LYMPH EXTRAV  INC GUIDE ROADMAPPING Result Date: 07/22/2023  CLINICAL DATA:  Distal duodenal polyp, status post partial resection, with active arterial bleeding, status post endoscopic clipping, and evidence of continued bleeding. EXAM: IR ULTRASOUND GUIDANCE VASC ACCESS RIGHT; SELECTIVE VISCERAL ARTERIOGRAPHY; IR EMBO ART VEN HEMORR LYMPH EXTRAV INC GUIDE ROADMAPPING ANESTHESIA/SEDATION: Intravenous Fentanyl and Versed 2mg  were administered by RN during a total moderate (conscious) sedation time of 65 minutes; the patient's level of consciousness and physiological / cardiorespiratory status were monitored continuously by radiology RN under my direct supervision. MEDICATIONS: Lidocaine 1% subcutaneous CONTRAST:  OMNIPAQUE IOHEXOL 300 MG/ML SOLN, <See Chart> OMNIPAQUE IOHEXOL 300 MG/ML SOLN PROCEDURE: The procedure, risks (including but not limited to bleeding, infection, organ damage ), benefits, and alternatives were explained to the patient.  Questions regarding the procedure were encouraged and answered. The patient understands and consents to the procedure. Right femoral region prepped and draped in usual sterile fashion. Maximal barrier sterile technique was utilized including caps, mask, sterile gowns, sterile gloves, sterile drape, hand hygiene and skin antiseptic. The right common femoral artery was localized under ultrasound. Under real-time ultrasound guidance, the vessel was accessed with a 21-gauge micropuncture needle, exchanged over a 018 guidewire for a transitional dilator, through which a 035 guidewire was advanced. Over this, a 5 [place] vascular sheath was placed, through which a 5 [place] C2 catheter was advanced and used to selectively catheterize the splenic artery for selective arteriography. The C2 was then utilized to selectively catheterize the superior mesenteric artery for selective arteriography. Catheter was advanced into the common hepatic artery for additional selective arteriography in multiple projections. Endoscopic clips were localized and feeding vessels were identified. A coaxial Renegade catheter was then advanced with fathom guidewire and used to selectively catheterize a pancreaticoduodenal artery supplying the lesion. Sub selective arteriography was obtained in multiple projections. The vessel was then embolized distally with 2 coils. In similar fashion, the gastroduodenal artery was selectively catheterized with a microcatheter in multiple projections, demonstrating perfusion to the region of concern as well as large right gastroepiploic artery. The vessel was then embolized distal with microcoils to allow continued collateral perfusion to the gastroepiploic artery. A separate superior pancreaticoduodenal artery was identified from the distal common hepatic artery, and this too was selectively catheterized after confirmatory arteriography in multiple projections. Selective microcoil embolization performed.  Microcatheter was removed. Follow-up common hepatic arteriogram shows excellent diminution of flow to the distal duodenal region marked by endoscopic clip, with continued patency of proper hepatic artery distally. The catheter and sheath were removed and hemostasis achieved with manual compression. The patient tolerated the procedure well. COMPLICATIONS: None immediate FINDINGS:: FINDINGS: No active arterial extravasation is identified. Variant gastroduodenal artery anatomy is identified with 3 separate contributing branches from the common hepatic artery. Using the endoscopic clips as confirmatory guidance, supplying branches of the gastroduodenal artery from were successfully embolized with microcoils. No immediate complication. IMPRESSION: Successful selective mesenteric arteriography and selective embolization of gastroduodenal artery branches supplying the distal duodenal polyp. Electronically Signed   By: Corlis Leak M.D.   On: 07/22/2023 19:09   IR US Guide Vasc Access Right Result Date: 07/22/2023 CLINICAL DATA:  Distal duodenal polyp, status post partial resection, with active arterial bleeding, status post endoscopic clipping, and evidence of continued bleeding. EXAM: IR ULTRASOUND GUIDANCE VASC ACCESS RIGHT; SELECTIVE VISCERAL ARTERIOGRAPHY; IR EMBO ART VEN HEMORR LYMPH EXTRAV INC GUIDE ROADMAPPING ANESTHESIA/SEDATION: Intravenous Fentanyl and Versed 2mg  were administered by RN during a total moderate (conscious) sedation time of 65 minutes; the patient's level of consciousness and  physiological / cardiorespiratory status were monitored continuously by radiology RN under my direct supervision. MEDICATIONS: Lidocaine 1% subcutaneous CONTRAST:  OMNIPAQUE IOHEXOL 300 MG/ML SOLN, <See Chart> OMNIPAQUE IOHEXOL 300 MG/ML SOLN PROCEDURE: The procedure, risks (including but not limited to bleeding, infection, organ damage ), benefits, and alternatives were explained to the patient. Questions  regarding the procedure were encouraged and answered. The patient understands and consents to the procedure. Right femoral region prepped and draped in usual sterile fashion. Maximal barrier sterile technique was utilized including caps, mask, sterile gowns, sterile gloves, sterile drape, hand hygiene and skin antiseptic. The right common femoral artery was localized under ultrasound. Under real-time ultrasound guidance, the vessel was accessed with a 21-gauge micropuncture needle, exchanged over a 018 guidewire for a transitional dilator, through which a 035 guidewire was advanced. Over this, a 5 [place] vascular sheath was placed, through which a 5 [place] C2 catheter was advanced and used to selectively catheterize the splenic artery for selective arteriography. The C2 was then utilized to selectively catheterize the superior mesenteric artery for selective arteriography. Catheter was advanced into the common hepatic artery for additional selective arteriography in multiple projections. Endoscopic clips were localized and feeding vessels were identified. A coaxial Renegade catheter was then advanced with fathom guidewire and used to selectively catheterize a pancreaticoduodenal artery supplying the lesion. Sub selective arteriography was obtained in multiple projections. The vessel was then embolized distally with 2 coils. In similar fashion, the gastroduodenal artery was selectively catheterized with a microcatheter in multiple projections, demonstrating perfusion to the region of concern as well as large right gastroepiploic artery. The vessel was then embolized distal with microcoils to allow continued collateral perfusion to the gastroepiploic artery. A separate superior pancreaticoduodenal artery was identified from the distal common hepatic artery, and this too was selectively catheterized after confirmatory arteriography in multiple projections. Selective microcoil embolization performed. Microcatheter was  removed. Follow-up common hepatic arteriogram shows excellent diminution of flow to the distal duodenal region marked by endoscopic clip, with continued patency of proper hepatic artery distally. The catheter and sheath were removed and hemostasis achieved with manual compression. The patient tolerated the procedure well. COMPLICATIONS: None immediate FINDINGS:: FINDINGS: No active arterial extravasation is identified. Variant gastroduodenal artery anatomy is identified with 3 separate contributing branches from the common hepatic artery. Using the endoscopic clips as confirmatory guidance, supplying branches of the gastroduodenal artery from were successfully embolized with microcoils. No immediate complication. IMPRESSION: Successful selective mesenteric arteriography and selective embolization of gastroduodenal artery branches supplying the distal duodenal polyp. Electronically Signed   By: Corlis Leak M.D.   On: 07/22/2023 19:09   IR Angiogram Visceral Selective Result Date: 07/22/2023 CLINICAL DATA:  Distal duodenal polyp, status post partial resection, with active arterial bleeding, status post endoscopic clipping, and evidence of continued bleeding. EXAM: IR ULTRASOUND GUIDANCE VASC ACCESS RIGHT; SELECTIVE VISCERAL ARTERIOGRAPHY; IR EMBO ART VEN HEMORR LYMPH EXTRAV INC GUIDE ROADMAPPING ANESTHESIA/SEDATION: Intravenous Fentanyl and Versed 2mg  were administered by RN during a total moderate (conscious) sedation time of 65 minutes; the patient's level of consciousness and physiological / cardiorespiratory status were monitored continuously by radiology RN under my direct supervision. MEDICATIONS: Lidocaine 1% subcutaneous CONTRAST:  OMNIPAQUE IOHEXOL 300 MG/ML SOLN, <See Chart> OMNIPAQUE IOHEXOL 300 MG/ML SOLN PROCEDURE: The procedure, risks (including but not limited to bleeding, infection, organ damage ), benefits, and alternatives were explained to the patient. Questions regarding the  procedure were encouraged and answered. The patient understands and consents to the procedure.  Right femoral region prepped and draped in usual sterile fashion. Maximal barrier sterile technique was utilized including caps, mask, sterile gowns, sterile gloves, sterile drape, hand hygiene and skin antiseptic. The right common femoral artery was localized under ultrasound. Under real-time ultrasound guidance, the vessel was accessed with a 21-gauge micropuncture needle, exchanged over a 018 guidewire for a transitional dilator, through which a 035 guidewire was advanced. Over this, a 5 [place] vascular sheath was placed, through which a 5 [place] C2 catheter was advanced and used to selectively catheterize the splenic artery for selective arteriography. The C2 was then utilized to selectively catheterize the superior mesenteric artery for selective arteriography. Catheter was advanced into the common hepatic artery for additional selective arteriography in multiple projections. Endoscopic clips were localized and feeding vessels were identified. A coaxial Renegade catheter was then advanced with fathom guidewire and used to selectively catheterize a pancreaticoduodenal artery supplying the lesion. Sub selective arteriography was obtained in multiple projections. The vessel was then embolized distally with 2 coils. In similar fashion, the gastroduodenal artery was selectively catheterized with a microcatheter in multiple projections, demonstrating perfusion to the region of concern as well as large right gastroepiploic artery. The vessel was then embolized distal with microcoils to allow continued collateral perfusion to the gastroepiploic artery. A separate superior pancreaticoduodenal artery was identified from the distal common hepatic artery, and this too was selectively catheterized after confirmatory arteriography in multiple projections. Selective microcoil embolization performed. Microcatheter was removed.  Follow-up common hepatic arteriogram shows excellent diminution of flow to the distal duodenal region marked by endoscopic clip, with continued patency of proper hepatic artery distally. The catheter and sheath were removed and hemostasis achieved with manual compression. The patient tolerated the procedure well. COMPLICATIONS: None immediate FINDINGS:: FINDINGS: No active arterial extravasation is identified. Variant gastroduodenal artery anatomy is identified with 3 separate contributing branches from the common hepatic artery. Using the endoscopic clips as confirmatory guidance, supplying branches of the gastroduodenal artery from were successfully embolized with microcoils. No immediate complication. IMPRESSION: Successful selective mesenteric arteriography and selective embolization of gastroduodenal artery branches supplying the distal duodenal polyp. Electronically Signed   By: Corlis Leak M.D.   On: 07/22/2023 19:09    Assessment/Plan: GI bleed S/p GDA embo Hgb 8.7 but hemodynamically stable. No plans for repeat intervention at this time.     LOS: 2 days   I spent a total of 20 minutes in face to face in clinical consultation, greater than 50% of which was counseling/coordinating care for GI bleed s/p angio/embo  Brayton El PA-C 07/23/2023 10:54 AM

## 2023-07-23 NOTE — Progress Notes (Addendum)
NAME:  Peggy Armstrong, MRN:  [id number], DOB:  05/24/1946, LOS: 2 ADMISSION DATE:  07/20/2023, CONSULTATION DATE:  07/21/23 REFERRING MD:  Arlean Hopping , CHIEF COMPLAINT:  UGIB   History of Present Illness:  78 yo F PMH HTN HLD diastolic HF, hx DVT, Afib on eliquis and recurrent GIB, who presented to ED 07/20/23 with bloody diarrhea beginning morning of presentation. 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 Eliquis resumed 4d later.  In ED she initially had borderline soft pressures and was given IVF while awaiting labs and CT angio. H/H 7.8, then 6.8 (with baseline around 11) and was transfused 1 PRBC and given Kcentra. CT anio with active bleeding within second and third portions of duodenum. GI and IR were engaged. Felt too proximal for IR intervention. Seen by GI. At that time she was not having active bloody BM and it was rec for 1 additional PRBC and EGD 1/24 AM with Dr. Chales Abrahams.   1/23-24 night, the patient starting having frequent dark bloody bowel movements. Hgb 7.8 (after 2 PRBC) She became hypotensive and was transfused 4 PRBC (5th pending) and given a second dose of Kcentra. TRH updated GI, and consulted PCCM for transfer to ICU.    Pertinent  Medical History  GIB Afib on eliquis DVT 2017 dHF HTN HLD  Hypothyroidism GERD Ampullary adenoma s/p ampullectomy 2019 [facility] Duodenal adenoma esophagitits  Significant Hospital Events: Including procedures, antibiotic start and stop dates in addition to other pertinent events   1/14 incomplete mucosal resection of duodenal adenoma at [facility]. 1/18 eliquis resumed 1/23 to ED w dark bloody BM, CT angio with active extrav. admit to [facility]. Kcentra and 2 PRBC. GI consult 1/24 incr output. Hypotensive. 2nd dose K Centra. Additional 5 PRBC. PCCM consult.  EGD w/ GI> duodenal ulcer clipped 1/25 rebled, 2PRBC,1FFP, 1plt, requiring pressors, s/p GDA branches embolization x3  Interim History /  Subjective:  C/o intermittent sharp upper abd pain and starting to pass flatus, better with simethicone overnight.  No nausea.  No significant BM.  Objective   Blood pressure 120/61, pulse 66, temperature 98.9 F (37.2 C), temperature source Axillary, resp. rate 16, height 5' (1.524 m), weight 85.1 kg, SpO2 98%.        Intake/Output Summary (Last 24 hours) at 07/23/2023 0726 Last data filed at 07/23/2023 0600 Gross per 24 hour  Intake 3047.39 ml  Output 1310 ml  Net 1737.39 ml   Filed Weights   07/21/23 0858 07/22/23 0254 07/23/23 0334  Weight: 84.6 kg 84.9 kg 85.1 kg    Examination: General:  AoC ill appearing elderly female sitting upright in bed in NAD HEENT: MM pink/moist, no JVD Neuro: awake, oriented, MAE- generalized weakness, slight resting tremor BUE CV: rr, no murmur, R groin site cdi, good distal pulse PULM:  non labored, clear anteriorly, diminished bases, RA GI: soft, obese, hyperBS, NT, purwick Extremities: warm/dry, no tibial edema  Skin: no rashes    Labs pending today   Resolved Hospital Problem list     Assessment & Plan:   Hemorrhagic shock Active duodenal bleeds  ABLA on chronic anemia Medication induced coagulopathy Known duodenal adenoma, 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  If  rebleeding, recs for IR embolization - s/p IR embolization x3 of GDA branches 1/25 - Appreciate GI and IR assistance and await further input - weaning low dose NE, hopeful to stop as previous labs appear reassuring.  Goal MAP > 65.  Remains afebrile and no obvious infectious symptoms to suggest other cause.  Warm and perfused distally. Check lactic empirically and PCT  - start clears and advance as tolerated if CBC/ lactic  reassuring - pending CBC, transfuse for Hgb < 7 or significant bleeding - cont PPI BID - cont to hold [facility] indefinitely pending further plans for resection TBD   AKI due to hypoperfusion, at risk for contrast nephropathy - stable UOP overnight , await BMET, sCr yesterday evening - stop LR when taking POs - - Trend BMP daily / urinary output/ strict I/Os - Replace electrolytes as indicated - Avoid nephrotoxic agents, ensure adequate renal perfusion   Hx Afib Chronic anticoagulation - last dose eliquis 1/22 s/p Kcentra 1/23 and 1/24 P:  - remains in NSR, currently but at times appears to have flutter waves.   - amio PO resumed 1/25 - optimize electrolytes  - cont to hold eliquis> timing to restart to be deferred to GI as will need further duodenal resection.  Will need cardiology input when more stable given AC to hold indefinitely for now pending further GI plans/ need for resection.  Question if candidate for watchmans device    Hx HTN HLD Hx rHFpEF - 05/2023 TTE LVEF 55-60%, normal diastolic, normal RV, mildly elevated RVSP 42.7 P:  - cont to hold home lasix, no signs of hypervolemia, remains on pressors - can restart lipitor  Elevated LFTs, mild and improving - LFTs/ t. bili normalized.   Hypothyroidism - resume synthroid   Depression/ anxiety - resume zoloft/ buspar   Debility, chronic  Hx lumbar fx/ chronic pain - d/c'd from rehab early Jan.  Uses wheelchair, walker at home - PT/ OT - tylenol prn, rarely uses oxy at home per pt, gets more benefit from tylenol/ muscle relaxer's  - would avoid home dose of zanaflex for now, 4mg  caused hypotension 1/24.  Consider reduced dose if needed for restless legs.    Best Practice (right click and "Reselect all SmartList Selections" daily)   Diet/type: NPO; clears  DVT prophylaxis SCD Pressure ulcer(s): pressure ulcer assessment deferred .  Lines: N/A Foley:  N/A Code Status:  full code Last date of multidisciplinary  goals of care discussion [1/25 ]  Granddaughter updated w/ pt at bedside on plan of care 1/26  Labs   CBC: Recent Labs  Lab 07/18/23 1542 07/20/23 1213 07/20/23 1401 07/21/23 0830 07/21/23 1136 07/21/23 2158 07/22/23 0022 07/22/23 0826 07/22/23 0827 07/22/23 1553 07/22/23 2208  WBC 4.9 8.8  --  10.8*  --   --   --  11.4*  --   --   --   NEUTROABS 3.8 7.1  --   --   --   --   --  8.4*  --   --   --   HGB 10.4* 7.8*   < > 10.3*   < > 9.8* 7.4* 10.0*  --  9.6* 9.9*  HCT 34.2 26.5*   < > 30.2*   < > 29.5* 21.9* 30.6*  --  28.5* 28.9*  MCV 97 101.5*  --  87.0  --   --   --  91.9  --   --   --   PLT 116* 150  --  99*  --   --   --  161 159  --   --    < > = values in this interval not displayed.    Basic Metabolic Panel: Recent Labs  Lab 07/18/23 1542 07/20/23 1213 07/21/23 0755 07/21/23 0830 07/22/23 0022 07/22/23 1553  NA 141 140  --  137 134* 138  K 4.4 4.2  --  5.1 4.7 3.7  CL 102 105  --  105 104 106  CO2 23 23  --  23 23 24   GLUCOSE 96 147*  --  114* 120* 107*  BUN 18 26*  --  40* 42* 35*  CREATININE 0.93 1.09*  --  1.40* 1.37* 1.17*  CALCIUM 8.5* 7.8*  --  7.7* 7.4* 7.9*  MG  --   --  1.2*  --  2.2  --   PHOS  --   --   --   --  4.7*  --    GFR: Estimated Creatinine Clearance: 38.3 mL/min (A) (by C-G formula based on SCr of 1.17 mg/dL (H)). Recent Labs  Lab 07/18/23 1542 07/20/23 1213 07/21/23 0755 07/21/23 0830 07/22/23 0826  WBC 4.9 8.8  --  10.8* 11.4*  LATICACIDVEN  --   --  1.4  --   --     Liver Function Tests: Recent Labs  Lab 07/18/23 1542 07/20/23 1213 07/21/23 0830 07/22/23 0022  AST 48* 42* 29 24  ALT 30 26 22 19   ALKPHOS 160* 99 65 58  BILITOT 0.3 0.6 1.3* 0.8  PROT 5.7* 4.7* 3.8* 3.4*  ALBUMIN 3.3* 2.4* 2.0* 1.8*   No results for input(s): "LIPASE", "AMYLASE" in the last 168 hours. No results for input(s): "AMMONIA" in the last 168 hours.  ABG No results found for: "PHART", "PCO2ART", "PO2ART", "HCO3", "TCO2", "ACIDBASEDEF",  "O2SAT"   Coagulation Profile: Recent Labs  Lab 07/20/23 1213 07/21/23 0804 07/22/23 0827  INR 1.6* 1.2 1.2    Cardiac Enzymes: No results for input(s): "CKTOTAL", "CKMB", "CKMBINDEX", "TROPONINI" in the last 168 hours.  HbA1C: Hgb A1c MFr Bld  Date/Time Value Ref Range Status  07/18/2023 03:42 PM 4.3 (L) 4.8 - 5.6 % Final    Comment:             Prediabetes: 5.7 - 6.4          Diabetes: >6.4          Glycemic control for adults with diabetes: <7.0     CBG: Recent Labs  Lab 07/22/23 0324 07/22/23 0710 07/22/23 1128 07/22/23 1509 07/23/23 0712  GLUCAP 133* 122* 111* 96 86    Allergies Allergies  Allergen Reactions   Codeine Nausea And Vomiting    Made "very sick"   Tape      Home Medications  Prior to Admission medications   Medication Sig Start Date End Date Taking? Authorizing Provider  acetaminophen (TYLENOL) 325 MG tablet Take 650 mg by mouth every 6 (six) hours as needed for mild pain (pain score 1-3) or moderate pain (pain score 4-6).   Yes [provider]  allopurinol (ZYLOPRIM) 300 MG tablet Take 1 tablet (300 mg total) by mouth daily. 07/18/23  Yes Rothfuss, Teryl Lucy, PA-C  amiodarone (PACERONE) 200 MG tablet Take 1 tablet (200 mg total) by mouth daily. Please call [phone] to schedule cardiology follow up for further refills. 06/22/23  Yes Alver Sorrow, NP  Ascorbic Acid (VITAMIN C CR) 500 MG TBCR Take 500 mg by mouth daily.   Yes  [provider]  atorvastatin (LIPITOR) 20 MG tablet Take 1 tablet (20 mg total) by mouth daily. 12/19/22  Yes Alver Sorrow, NP  busPIRone (BUSPAR) 10 MG tablet Take 1 tablet (10 mg total) by mouth 2 (two) times daily. 07/18/23  Yes Rothfuss, Teryl Lucy, PA-C  cholecalciferol (VITAMIN D) 1000 units tablet Take 1,000 Units by mouth daily.   Yes [provider]  cyanocobalamin (VITAMIN B12) 1000 MCG tablet Take 1 tablet by mouth daily.   Yes [provider]  ELIQUIS 5 MG TABS tablet  Take 5 mg by mouth 2 (two) times daily. 07/02/23  Yes [provider]  ferrous sulfate 325 (65 FE) MG tablet Take 325 mg by mouth daily with breakfast.   Yes [provider]  folic acid (FOLVITE) 1 MG tablet Take 1 tablet (1 mg total) by mouth daily. 05/18/23  Yes Osvaldo Shipper, MD  furosemide (LASIX) 20 MG tablet Take 1 tablet (20 mg total) by mouth daily. 07/18/23  Yes Rothfuss, Teryl Lucy, PA-C  KLOR-CON M20 20 MEQ tablet Take 20 mEq by mouth daily.   Yes [provider]  levothyroxine (SYNTHROID) 100 MCG tablet Take 1 tablet (100 mcg total) by mouth daily before breakfast. 07/18/23  Yes Rothfuss, Gerilyn Pilgrim T, PA-C  melatonin 3 MG TABS tablet Take 1 tablet (3 mg total) by mouth at bedtime as needed (insomnia). 05/18/23  Yes Osvaldo Shipper, MD  ondansetron (ZOFRAN) 4 MG tablet Take 4 mg by mouth every 6 (six) hours as needed for nausea or vomiting.   Yes [provider]  oxyCODONE (OXY IR/ROXICODONE) 5 MG immediate release tablet Take 5 mg by mouth as needed for severe pain (pain score 7-10).   Yes [provider]  pantoprazole (PROTONIX) 40 MG tablet Take 1 tablet (40 mg total) by mouth daily. 07/18/23  Yes Rothfuss, Teryl Lucy, PA-C  sertraline (ZOLOFT) 25 MG tablet Take 1 tablet (25 mg total) by mouth daily. 07/18/23  Yes Rothfuss, Teryl Lucy, PA-C  tiZANidine (ZANAFLEX) 4 MG tablet Take 1 tablet (4 mg total) by mouth every 6 (six) hours as needed for muscle spasms. 07/18/23  Yes Rothfuss, Teryl Lucy, PA-C     Critical care time: 35 min         Posey Boyer, MSN, AG-ACNP-BC [facility] 07/23/2023, 7:26 AM  See Amion for pager If no response to pager , please call [phone] until 7pm After 7:00 pm call Elink  [phone]

## 2023-07-23 NOTE — Progress Notes (Addendum)
Patient ID: Peggy Armstrong, female   DOB: 04/10/1946, 78 y.o.   MRN: [id number]    Progress Note   Subjective   Day # 4 CC; acute major upper GI bleed post EMR large multilobulated duodenal adenoma at [facility]/Dr. Corliss Parish 07/11/2023-resumed Eliquis 4 days postprocedure  IV PPI  Patient required 2 doses of Kcentra and 7 units of packed RBCs  EGD on 07/21/2023 with 3 ulcers noted in the area of the EMR, 1 of these was over the area of prior ampullectomy, and a second ulcer in the distal duodenum had visible vessel with overlying clot, this was clipped and treated with puristat  Had recurrent bleeding overnight into yesterday morning, and went to IR yesterday with his enteric angiogram and  embolectomy x 3 of  gastroduodenal artery branch.  She has been complaining of what she describes as gas pain across the upper abdomen She was hemodynamically stable overnight, tried to sit up on the side of the bed a little while ago, became nauseated still complaining of upper abdominal discomfort  Rectal tube with dark melenic stool  Family at bedside, still feeling nauseated  BUN 26/creatinine 1.12 Lactate 0.8 WBC 5.7/hemoglobin 8.7/hematocrit 26.1-down from 9.9 last p.m.     Objective   Vital signs in last 24 hours: Temp:  [97.3 F (36.3 C)-98.9 F (37.2 C)] 98.3 F (36.8 C) (01/26 1127) Pulse Rate:  [53-72] 67 (01/26 1200) Resp:  [10-24] 18 (01/26 1200) BP: (83-153)/(39-108) 125/63 (01/26 1200) SpO2:  [89 %-100 %] 95 % (01/26 1200) Weight:  [85.1 kg] 85.1 kg (01/26 0334) Last BM Date : 07/22/23 General: Elderly white female in NAD very pleasant, little flushed appearing Heart:  Regular rate and rhythm; no murmurs Lungs: Respirations even and unlabored, lungs CTA bilaterally Abdomen:  Soft,  nondistended,, bowel sounds are present, she has some mild tenderness in the epigastrium Extremities: Upper extremities edematous Neurologic:  Alert and oriented,  grossly normal  neurologically. Psych:  Cooperative. Normal mood and affect.  Intake/Output from previous day: 01/25 0701 - 01/26 0700 In: 3047.4 [P.O.:120; I.V.:2093.5; Blood:833.9] Out: 1860 [Urine:1810; Stool:50] Intake/Output this shift: Total I/O In: 439.3 [I.V.:439.3] Out: -   Lab Results: Recent Labs    07/21/23 0830 07/21/23 1136 07/22/23 0826 07/22/23 0827 07/22/23 1553 07/22/23 2208 07/23/23 0913  WBC 10.8*  --  11.4*  --   --   --  5.7  HGB 10.3*   < > 10.0*  --  9.6* 9.9* 8.7*  HCT 30.2*   < > 30.6*  --  28.5* 28.9* 26.1*  PLT 99*  --  161 159  --   --  71*   < > = values in this interval not displayed.   BMET Recent Labs    07/22/23 0022 07/22/23 1553 07/23/23 0913  NA 134* 138 136  K 4.7 3.7 3.8  CL 104 106 106  CO2 23 24 24   GLUCOSE 120* 107* 87  BUN 42* 35* 26*  CREATININE 1.37* 1.17* 1.12*  CALCIUM 7.4* 7.9* 7.8*   LFT Recent Labs    07/22/23 0022  PROT 3.4*  ALBUMIN 1.8*  AST 24  ALT 19  ALKPHOS 58  BILITOT 0.8  BILIDIR 0.2  IBILI 0.6   PT/INR Recent Labs    07/21/23 0804 07/22/23 0827  LABPROT 15.7* 15.2  INR 1.2 1.2    Studies/Results: IR EMBO ART  VEN HEMORR LYMPH EXTRAV  INC GUIDE ROADMAPPING Result Date: 07/22/2023 CLINICAL DATA:  Distal duodenal polyp, status post  partial resection, with active arterial bleeding, status post endoscopic clipping, and evidence of continued bleeding. EXAM: IR ULTRASOUND GUIDANCE VASC ACCESS RIGHT; SELECTIVE VISCERAL ARTERIOGRAPHY; IR EMBO ART VEN HEMORR LYMPH EXTRAV INC GUIDE ROADMAPPING ANESTHESIA/SEDATION: Intravenous Fentanyl and Versed 2mg  were administered by RN during a total moderate (conscious) sedation time of 65 minutes; the patient's level of consciousness and physiological / cardiorespiratory status were monitored continuously by radiology RN under my direct supervision. MEDICATIONS: Lidocaine 1% subcutaneous CONTRAST:  OMNIPAQUE IOHEXOL 300 MG/ML SOLN, <See Chart> OMNIPAQUE IOHEXOL 300  MG/ML SOLN PROCEDURE: The procedure, risks (including but not limited to bleeding, infection, organ damage ), benefits, and alternatives were explained to the patient. Questions regarding the procedure were encouraged and answered. The patient understands and consents to the procedure. Right femoral region prepped and draped in usual sterile fashion. Maximal barrier sterile technique was utilized including caps, mask, sterile gowns, sterile gloves, sterile drape, hand hygiene and skin antiseptic. The right common femoral artery was localized under ultrasound. Under real-time ultrasound guidance, the vessel was accessed with a 21-gauge micropuncture needle, exchanged over a 018 guidewire for a transitional dilator, through which a 035 guidewire was advanced. Over this, a 5 [place] vascular sheath was placed, through which a 5 [place] C2 catheter was advanced and used to selectively catheterize the splenic artery for selective arteriography. The C2 was then utilized to selectively catheterize the superior mesenteric artery for selective arteriography. Catheter was advanced into the common hepatic artery for additional selective arteriography in multiple projections. Endoscopic clips were localized and feeding vessels were identified. A coaxial Renegade catheter was then advanced with fathom guidewire and used to selectively catheterize a pancreaticoduodenal artery supplying the lesion. Sub selective arteriography was obtained in multiple projections. The vessel was then embolized distally with 2 coils. In similar fashion, the gastroduodenal artery was selectively catheterized with a microcatheter in multiple projections, demonstrating perfusion to the region of concern as well as large right gastroepiploic artery. The vessel was then embolized distal with microcoils to allow continued collateral perfusion to the gastroepiploic artery. A separate superior pancreaticoduodenal artery was identified from the distal common  hepatic artery, and this too was selectively catheterized after confirmatory arteriography in multiple projections. Selective microcoil embolization performed. Microcatheter was removed. Follow-up common hepatic arteriogram shows excellent diminution of flow to the distal duodenal region marked by endoscopic clip, with continued patency of proper hepatic artery distally. The catheter and sheath were removed and hemostasis achieved with manual compression. The patient tolerated the procedure well. COMPLICATIONS: None immediate FINDINGS:: FINDINGS: No active arterial extravasation is identified. Variant gastroduodenal artery anatomy is identified with 3 separate contributing branches from the common hepatic artery. Using the endoscopic clips as confirmatory guidance, supplying branches of the gastroduodenal artery from were successfully embolized with microcoils. No immediate complication. IMPRESSION: Successful selective mesenteric arteriography and selective embolization of gastroduodenal artery branches supplying the distal duodenal polyp. Electronically Signed   By: Corlis Leak M.D.   On: 07/22/2023 19:09   IR US Guide Vasc Access Right Result Date: 07/22/2023 CLINICAL DATA:  Distal duodenal polyp, status post partial resection, with active arterial bleeding, status post endoscopic clipping, and evidence of continued bleeding. EXAM: IR ULTRASOUND GUIDANCE VASC ACCESS RIGHT; SELECTIVE VISCERAL ARTERIOGRAPHY; IR EMBO ART VEN HEMORR LYMPH EXTRAV INC GUIDE ROADMAPPING ANESTHESIA/SEDATION: Intravenous Fentanyl and Versed 2mg  were administered by RN during a total moderate (conscious) sedation time of 65 minutes; the patient's level of consciousness and physiological / cardiorespiratory status were monitored continuously by  radiology RN under my direct supervision. MEDICATIONS: Lidocaine 1% subcutaneous CONTRAST:  OMNIPAQUE IOHEXOL 300 MG/ML SOLN, <See Chart> OMNIPAQUE IOHEXOL 300 MG/ML SOLN PROCEDURE:  The procedure, risks (including but not limited to bleeding, infection, organ damage ), benefits, and alternatives were explained to the patient. Questions regarding the procedure were encouraged and answered. The patient understands and consents to the procedure. Right femoral region prepped and draped in usual sterile fashion. Maximal barrier sterile technique was utilized including caps, mask, sterile gowns, sterile gloves, sterile drape, hand hygiene and skin antiseptic. The right common femoral artery was localized under ultrasound. Under real-time ultrasound guidance, the vessel was accessed with a 21-gauge micropuncture needle, exchanged over a 018 guidewire for a transitional dilator, through which a 035 guidewire was advanced. Over this, a 5 [place] vascular sheath was placed, through which a 5 [place] C2 catheter was advanced and used to selectively catheterize the splenic artery for selective arteriography. The C2 was then utilized to selectively catheterize the superior mesenteric artery for selective arteriography. Catheter was advanced into the common hepatic artery for additional selective arteriography in multiple projections. Endoscopic clips were localized and feeding vessels were identified. A coaxial Renegade catheter was then advanced with fathom guidewire and used to selectively catheterize a pancreaticoduodenal artery supplying the lesion. Sub selective arteriography was obtained in multiple projections. The vessel was then embolized distally with 2 coils. In similar fashion, the gastroduodenal artery was selectively catheterized with a microcatheter in multiple projections, demonstrating perfusion to the region of concern as well as large right gastroepiploic artery. The vessel was then embolized distal with microcoils to allow continued collateral perfusion to the gastroepiploic artery. A separate superior pancreaticoduodenal artery was identified from the distal common hepatic artery, and  this too was selectively catheterized after confirmatory arteriography in multiple projections. Selective microcoil embolization performed. Microcatheter was removed. Follow-up common hepatic arteriogram shows excellent diminution of flow to the distal duodenal region marked by endoscopic clip, with continued patency of proper hepatic artery distally. The catheter and sheath were removed and hemostasis achieved with manual compression. The patient tolerated the procedure well. COMPLICATIONS: None immediate FINDINGS:: FINDINGS: No active arterial extravasation is identified. Variant gastroduodenal artery anatomy is identified with 3 separate contributing branches from the common hepatic artery. Using the endoscopic clips as confirmatory guidance, supplying branches of the gastroduodenal artery from were successfully embolized with microcoils. No immediate complication. IMPRESSION: Successful selective mesenteric arteriography and selective embolization of gastroduodenal artery branches supplying the distal duodenal polyp. Electronically Signed   By: Corlis Leak M.D.   On: 07/22/2023 19:09   IR Angiogram Visceral Selective Result Date: 07/22/2023 CLINICAL DATA:  Distal duodenal polyp, status post partial resection, with active arterial bleeding, status post endoscopic clipping, and evidence of continued bleeding. EXAM: IR ULTRASOUND GUIDANCE VASC ACCESS RIGHT; SELECTIVE VISCERAL ARTERIOGRAPHY; IR EMBO ART VEN HEMORR LYMPH EXTRAV INC GUIDE ROADMAPPING ANESTHESIA/SEDATION: Intravenous Fentanyl and Versed 2mg  were administered by RN during a total moderate (conscious) sedation time of 65 minutes; the patient's level of consciousness and physiological / cardiorespiratory status were monitored continuously by radiology RN under my direct supervision. MEDICATIONS: Lidocaine 1% subcutaneous CONTRAST:  OMNIPAQUE IOHEXOL 300 MG/ML SOLN, <See Chart> OMNIPAQUE IOHEXOL 300 MG/ML SOLN PROCEDURE: The procedure, risks  (including but not limited to bleeding, infection, organ damage ), benefits, and alternatives were explained to the patient. Questions regarding the procedure were encouraged and answered. The patient understands and consents to the procedure. Right femoral region prepped and draped in usual  sterile fashion. Maximal barrier sterile technique was utilized including caps, mask, sterile gowns, sterile gloves, sterile drape, hand hygiene and skin antiseptic. The right common femoral artery was localized under ultrasound. Under real-time ultrasound guidance, the vessel was accessed with a 21-gauge micropuncture needle, exchanged over a 018 guidewire for a transitional dilator, through which a 035 guidewire was advanced. Over this, a 5 [place] vascular sheath was placed, through which a 5 [place] C2 catheter was advanced and used to selectively catheterize the splenic artery for selective arteriography. The C2 was then utilized to selectively catheterize the superior mesenteric artery for selective arteriography. Catheter was advanced into the common hepatic artery for additional selective arteriography in multiple projections. Endoscopic clips were localized and feeding vessels were identified. A coaxial Renegade catheter was then advanced with fathom guidewire and used to selectively catheterize a pancreaticoduodenal artery supplying the lesion. Sub selective arteriography was obtained in multiple projections. The vessel was then embolized distally with 2 coils. In similar fashion, the gastroduodenal artery was selectively catheterized with a microcatheter in multiple projections, demonstrating perfusion to the region of concern as well as large right gastroepiploic artery. The vessel was then embolized distal with microcoils to allow continued collateral perfusion to the gastroepiploic artery. A separate superior pancreaticoduodenal artery was identified from the distal common hepatic artery, and this too was selectively  catheterized after confirmatory arteriography in multiple projections. Selective microcoil embolization performed. Microcatheter was removed. Follow-up common hepatic arteriogram shows excellent diminution of flow to the distal duodenal region marked by endoscopic clip, with continued patency of proper hepatic artery distally. The catheter and sheath were removed and hemostasis achieved with manual compression. The patient tolerated the procedure well. COMPLICATIONS: None immediate FINDINGS:: FINDINGS: No active arterial extravasation is identified. Variant gastroduodenal artery anatomy is identified with 3 separate contributing branches from the common hepatic artery. Using the endoscopic clips as confirmatory guidance, supplying branches of the gastroduodenal artery from were successfully embolized with microcoils. No immediate complication. IMPRESSION: Successful selective mesenteric arteriography and selective embolization of gastroduodenal artery branches supplying the distal duodenal polyp. Electronically Signed   By: Corlis Leak M.D.   On: 07/22/2023 19:09       Assessment / Plan:    #67 78 year old white female who underwent EMR/endoscopic mucosal resection of a very large multilobulated duodenal polyp, measuring greater than 5 cm at [facility]/Dr. Corliss Parish on 07/11/2023.  About 50% of the lesion was able to be resected.  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 Patient resumed Eliquis 4 days post procedure as instructed  Presented here on 07/20/2023 after grossly bloody bowel movements at home She required reversal with Kcentra x 2 and has had a total of 7 units of packed RBCs, and 1 unit of FFP 1 unit platelets  EGD with finding of 3 distinct ulcers in the area of the EMR, 1 with a visible vessel that was clipped and treated with Puristat  Rebled overnight and underwent IR angiogram and embolization x 3 of gastroduodenal artery branch yesterday  She has had a drop in  hemoglobin since yesterday of about 1 g She has been hemodynamically stable, complaining of gas pains and became quite nauseated and had to lay back down after trying to sit up earlier  I suspect she may have some ischemia to the embolization procedure was in the nausea and "gas pains"  Unclear at present whether she may still be oozing slowly, melena and rectal tube which is an improvement as yesterday she had gross  dark red blood  #2 anemia secondary to acute major GI bleeding #3 acute kidney injury improving #4 chronic anticoagulation-on Eliquis for atrial fibrillation-on hold since admit  #5 congestive heart failure preserved EF  Plan; full liquids as tolerates Zofran 4 mg IV every 6 hours as needed Continue to watch hemoglobin carefully and transfuse as indicated Continue IV PPI twice daily  GI will continue to follow Plan will be that she will not resume Eliquis on discharge, at least in the short-term until we have further communication with Dr. Corliss Parish regarding plans for removal/resection of the remaining residual tumor and timing of that given acute complications       Principal Problem:   GI bleed Active Problems:   Hemorrhagic shock (HCC)   ABLA (acute blood loss anemia)   Acute GI bleeding   AKI (acute kidney injury) (HCC)     LOS: 2 days   Amy Esterwood PA-C 07/23/2023, 12:33 PM     Attending physician's note   I have taken history, reviewed the chart and examined the patient. I performed a substantive portion of this encounter, including complete performance of at least one of the key components, in conjunction with the APP. I agree with the Advanced Practitioner's note, impression and recommendations.   Post Duodenal EMR bleed with hemorrhagic shock in setting of Eliquis s/p endo Rx, GDA embolization x 3 1/25.  Had some melena but stable Hb  Plan: -Advance diet -Trend CBC. Keep hb >7 -I have messages Dr. Edyth Gunnels.  He is aware. -Hold eliquis.  If it  needs to be resumed, please resume in 2 weeks. Pt in [facility] -D/W daughter and granddaughter.  They have follow-up appointment with Dr. Edyth Gunnels in 2 weeks (as televisit) -Will sign off for now. Pl call if any change in clinical status. -Routine follow-up with Dr. Adela Lank.   Edman Circle, MD [facility] [phone]

## 2023-07-23 NOTE — Progress Notes (Signed)
eLink Physician-Brief Progress Note Patient Name: TRACYE SZUCH DOB: Oct 27, 1945 MRN: [id number]   Date of Service  07/23/2023  HPI/Events of Note  Patient complains of gas pains.  She received simethicone earlier in the shift which give her some relief.  eICU Interventions  Simethicone ordered every 6 hours as needed for gas pain.     Intervention Category Minor Interventions: Routine modifications to care plan (e.g. PRN medications for pain, fever)  Carilyn Goodpasture 07/23/2023, 3:01 AM

## 2023-07-23 NOTE — Evaluation (Signed)
Physical Therapy Evaluation Patient Details Name: Peggy Armstrong MRN: [id number] DOB: 1945-11-06 Today's Date: 07/23/2023  History of Present Illness  Peggy Armstrong is a 78 y.o. female with recent admissions for recurrent GI bleeding requiring blood transfusion with most recent admission and discharge from 06/20/2023-06/21/2023 for GI bleeding with hemoglobin of 5.8 requiring 2 units packed red cell transfusion and Eliquis held during and after discharge.  She apparently had an EGD done about a week before admission at [facility] and Eliquis was subsequently resumed.  She presented with bloody diarrhea.  Clinical Impression   Pt admitted with above diagnosis. Lives at home with her daughter who works days, in a single-level home with a flight of steps to enter; Prior to admission, pt was able to walk short distances with RW and standby assist; when family isn't around, she gets around her home in her wheelchair; Able to scoot transfer wc<>commode in bathroom with modified independence; Pt and dtr describe technique for going up and down stairs with 2 person assist; Presents to PT with generalized weakness, inr fall risk, decr activity tolerance, functional dependencies;  Needed moderate assist to come to sit EOB, light mod assist to stand from EOB with RW, and slight post lean, bracing LEs against the bed; Pt very much hopes to recover to being able to get back home from this hospital stay, and 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 Pt will benefit from skilled PT to increase their independence and safety with mobility to allow discharge to the venue listed below.           If plan is discharge home, recommend the following: A lot of help with walking and/or transfers;A lot of help with bathing/dressing/bathroom;Assistance with cooking/housework;Help with stairs or ramp for entrance   Can travel by private vehicle         Equipment Recommendations Other (comment) (Consider using a shower seat with legs adjusted for a horizontal seat on stairs; in case of fatigue, she can sit down)  Recommendations for Other Services       Functional Status Assessment Patient has had a recent decline in their functional status and demonstrates the ability to make significant improvements in function in a reasonable and predictable amount of time.     Precautions / Restrictions Precautions Precautions: Back Precaution Comments: Back prec for comfort, with L2 compression fx Required Braces or Orthoses: Spinal Brace Spinal Brace: Applied in sitting position Restrictions Weight Bearing Restrictions Per Provider Order: No Other Position/Activity Restrictions: Uses the brace when up and walking; pt indicated she can don brace independently      Mobility  Bed Mobility Overal bed mobility: Needs Assistance Bed Mobility: Supine to Sit, Sit to Supine     Supine to sit: Min assist, +2 for safety/equipment Sit to supine: Mod assist, +2 for safety/equipment   General bed mobility comments: Min handheld assist to pull to sit; close guard for safety with high ICU bed; Mod assist to help Les back into bed    Transfers Overall transfer level: Needs assistance Equipment used: Rolling walker (2 wheels) Transfers: Sit to/from Stand Sit to Stand: Mod assist, +2 safety/equipment           General transfer comment: Mod assist to rise to standing; Noting pt braced backs of LEs against bed for stability; took a few small steps towards [facility]    Ambulation/Gait  Stairs            Wheelchair Mobility     Tilt Bed    Modified Rankin (Stroke Patients Only)       Balance Overall balance assessment: Needs assistance   Sitting balance-Leahy Scale: Fair       Standing balance-Leahy Scale: Poor                               Pertinent Vitals/Pain Pain Assessment Pain  Assessment: Faces Faces Pain Scale: Hurts a little bit Pain Location: Lower abdomen Pain Descriptors / Indicators: Grimacing Pain Intervention(s): Monitored during session    Home Living Family/patient expects to be discharged to:: Private residence Living Arrangements: Children (daughter) Available Help at Discharge: Family;Available PRN/intermittently (dtr works nearby) Type of Home: Apartment Home Access: Stairs to enter Entrance Stairs-Rails: Doctor, general practice of Steps: flight   Home Layout: One level Home Equipment: Shower seat;Grab bars - Chartered loss adjuster (2 wheels);Cane - single point Additional Comments: Has borrowed a w/c but does not have leg rest.  Wants to look into getting w/c    Prior Function Prior Level of Function : Needs assist             Mobility Comments: transfers bed to wc independently, and uses RW to get around home when alone; Only walks when she has standby assist, and she uses a RW; Before she injured back - walked with RW or w/c in apt independently (w/c if gout flair); for stairs does have min A at all times ADLs Comments: Could dress self and toilet (slip on shoes); had supervision in/out of tub but then bathed independently     Extremity/Trunk Assessment   Upper Extremity Assessment Upper Extremity Assessment: Generalized weakness;Defer to OT evaluation    Lower Extremity Assessment Lower Extremity Assessment: Generalized weakness    Cervical / Trunk Assessment Cervical / Trunk Assessment: Other exceptions Cervical / Trunk Exceptions: L2 compression fx, non-op; brace when wlaking  Communication   Communication Communication: No apparent difficulties  Cognition Arousal: Alert Behavior During Therapy: WFL for tasks assessed/performed Overall Cognitive Status: Within Functional Limits for tasks assessed                                 General Comments: Very pleasant adn joking a lot         General Comments General comments (skin integrity, edema, etc.): VSS on Room Air    Exercises     Assessment/Plan    PT Assessment Patient needs continued PT services  PT Problem List Decreased strength;Decreased activity tolerance;Decreased balance;Decreased mobility;Decreased coordination;Decreased knowledge of use of DME;Decreased knowledge of precautions;Obesity       PT Treatment Interventions DME instruction;Gait training;Stair training;Functional mobility training;Therapeutic activities;Therapeutic exercise;Balance training;Neuromuscular re-education;Patient/family education;Wheelchair mobility training    PT Goals (Current goals can be found in the Care Plan section)  Acute Rehab PT Goals Patient Stated Goal: wants to be able to get straight home PT Goal Formulation: With patient Time For Goal Achievement: 08/06/23 Potential to Achieve Goals: Good    Frequency Min 1X/week     Co-evaluation               AM-PAC PT "6 Clicks" Mobility  Outcome Measure Help needed turning from your back to your side while in a flat bed without using bedrails?: A Little Help needed moving from lying on  your back to sitting on the side of a flat bed without using bedrails?: A Lot Help needed moving to and from a bed to a chair (including a wheelchair)?: A Lot Help needed standing up from a chair using your arms (e.g., wheelchair or bedside chair)?: A Lot Help needed to walk in hospital room?: A Lot Help needed climbing 3-5 steps with a railing? : Total 6 Click Score: 12    End of Session   Activity Tolerance: Patient tolerated treatment well Patient left: in bed;with call bell/phone within reach;with family/visitor present Nurse Communication: Mobility status PT Visit Diagnosis: Unsteadiness on feet (R26.81);Other abnormalities of gait and mobility (R26.89);Muscle weakness (generalized) (M62.81)    Time: 8119-1478 PT Time Calculation (min) (ACUTE ONLY): 27 min   Charges:    PT Evaluation $PT Eval Moderate Complexity: 1 Mod PT Treatments $Therapeutic Activity: 8-22 mins PT General Charges $$ ACUTE PT VISIT: 1 Visit         Van Clines, PT  Acute Rehabilitation Services Office [phone] Secure Chat welcomed   Levi Aland 07/23/2023, 4:03 PM

## 2023-07-24 ENCOUNTER — Encounter (HOSPITAL_COMMUNITY): Payer: Self-pay | Admitting: Gastroenterology

## 2023-07-24 DIAGNOSIS — K922 Gastrointestinal hemorrhage, unspecified: Secondary | ICD-10-CM | POA: Diagnosis not present

## 2023-07-24 LAB — CBC
HCT: 26 % — ABNORMAL LOW (ref 36.0–46.0)
Hemoglobin: 8.4 g/dL — ABNORMAL LOW (ref 12.0–15.0)
MCH: 30.2 pg (ref 26.0–34.0)
MCHC: 32.3 g/dL (ref 30.0–36.0)
MCV: 93.5 fL (ref 80.0–100.0)
Platelets: 66 10*3/uL — ABNORMAL LOW (ref 150–400)
RBC: 2.78 MIL/uL — ABNORMAL LOW (ref 3.87–5.11)
RDW: 15.1 % (ref 11.5–15.5)
WBC: 6 10*3/uL (ref 4.0–10.5)
nRBC: 0 % (ref 0.0–0.2)

## 2023-07-24 LAB — GLUCOSE, CAPILLARY
Glucose-Capillary: 104 mg/dL — ABNORMAL HIGH (ref 70–99)
Glucose-Capillary: 113 mg/dL — ABNORMAL HIGH (ref 70–99)
Glucose-Capillary: 79 mg/dL (ref 70–99)
Glucose-Capillary: 84 mg/dL (ref 70–99)
Glucose-Capillary: 85 mg/dL (ref 70–99)
Glucose-Capillary: 95 mg/dL (ref 70–99)

## 2023-07-24 LAB — BASIC METABOLIC PANEL
Anion gap: 8 (ref 5–15)
BUN: 19 mg/dL (ref 8–23)
CO2: 24 mmol/L (ref 22–32)
Calcium: 7.7 mg/dL — ABNORMAL LOW (ref 8.9–10.3)
Chloride: 106 mmol/L (ref 98–111)
Creatinine, Ser: 1.06 mg/dL — ABNORMAL HIGH (ref 0.44–1.00)
GFR, Estimated: 54 mL/min — ABNORMAL LOW (ref >60)
Glucose, Bld: 95 mg/dL (ref 70–99)
Potassium: 2.9 mmol/L — ABNORMAL LOW (ref 3.5–5.1)
Sodium: 138 mmol/L (ref 135–145)

## 2023-07-24 LAB — MAGNESIUM: Magnesium: 1.6 mg/dL — ABNORMAL LOW (ref 1.7–2.4)

## 2023-07-24 MED ORDER — POTASSIUM CHLORIDE CRYS ER 20 MEQ PO TBCR
40.0000 meq | EXTENDED_RELEASE_TABLET | Freq: Once | ORAL | Status: AC
  Administered 2023-07-24: 40 meq via ORAL
  Filled 2023-07-24: qty 2

## 2023-07-24 MED ORDER — POTASSIUM CHLORIDE 10 MEQ/100ML IV SOLN
10.0000 meq | INTRAVENOUS | Status: AC
  Administered 2023-07-24 (×4): 10 meq via INTRAVENOUS
  Filled 2023-07-24 (×4): qty 100

## 2023-07-24 MED ORDER — MAGNESIUM SULFATE 4 GM/100ML IV SOLN
4.0000 g | Freq: Once | INTRAVENOUS | Status: AC
Start: 2023-07-24 — End: 2023-07-24
  Administered 2023-07-24: 4 g via INTRAVENOUS
  Filled 2023-07-24: qty 100

## 2023-07-24 NOTE — Progress Notes (Signed)
[facility] ADULT ICU REPLACEMENT PROTOCOL   The patient does apply for the [facility] Adult ICU Electrolyte Replacment Protocol based on the criteria listed below:   1.Exclusion criteria: TCTS, ECMO, Dialysis, and Myasthenia Gravis patients 2. Is GFR >/= 30 ml/min? Yes.    Patient's GFR today is 54 3. Is SCr </= 2? Yes.   Patient's SCr is 1.06 mg/dL 4. Did SCr increase >/= 0.5 in 24 hours? No. 5.Pt's weight >40kg  Yes.   6. Abnormal electrolyte(s): K+ 2.9, Mg 1.6  7. Electrolytes replaced per protocol 8.  Call MD STAT for K+ </= 2.5, Phos </= 1, or Mag </= 1 Physician:  Dr Cory Roughen, Jettie Booze 07/24/2023 4:39 AM

## 2023-07-24 NOTE — Progress Notes (Addendum)
PROGRESS NOTE    Peggy Armstrong  WUJ:[id number] DOB: 04-19-1946 DOA: 07/20/2023 PCP: Dominic Pea, PA-C   Brief Narrative:  This 78 years old female with PMH significant for HTN, HLD, diastolic HF, hx. DVT, Afib on eliquis and recurrent GIB, who presented to ED on  07/20/23 with bloody diarrhea. 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 Eliquis resumed 4days later.  Patient was hypotensive in the ED and was given 1 unit PRBC and Kcentra.  CT angio shows active bleeding within the second and third portions of duodenum.  GI and IR was consulted.  It was felt too proximal for IR intervention.  Patient was evaluated by GI, was given 1 unit of PRBC.  Patient was scheduled for EGD on 1/24 by Dr. Chales Abrahams.   On 1/23 night patient started having frequent dark bloody bowel movements.  Hemoglobin dropped significantly.  Patient was transfused with 4 units PRBC and was given second dose of Kcentra.  Patient was admitted in the ICU.  Now patient is hemodynamically stable TRH pickup 07/24/2023.  Significant hospital events: 1/14 incomplete mucosal resection of duodenal adenoma at [facility]. 1/18 eliquis resumed 1/23 to ED w dark bloody BM, CT angio with active extrav. admit to [facility]. Kcentra and 2 PRBC. GI consult 1/24 incr output. Hypotensive. 2nd dose K Centra. Additional 5 PRBC. PCCM consult.  EGD w/ GI> duodenal ulcer clipped 1/25 rebled, 2PRBC,1FFP, 1plt, requiring pressors, s/p GDA branches embolization x3 1/27 TRH pickup, H&H remains stable.  Assessment & Plan:   Principal Problem:   GI bleed Active Problems:   Hemorrhagic shock (HCC)   ABLA (acute blood loss anemia)   Acute GI bleeding   AKI (acute kidney injury) (HCC)  Hemorrhagic shock , Acute duodenal bleeding: Acute blood loss anemia on chronic anemia: Patient is status post K centra x 2, cumulative PRBC x 8, FFP, Plt 1. Status post EGD 1/24 shows post EMR ulcers, 1 visible  vessel status post clipping,  multiple gastric polyps. Patient continued to have bleed, status post IR embolization x 3 of GDA branches 1/25. Appreciate GI and IR assistance. Successfully weaned down to Levophed.  Maintaining goal MAP of above 65.   Patient started on clear liquid diet, advance as tolerated. H&H remains stable,  Keep it above 7 gms.  Continue pantoprazole 40 twice daily Continue to hold anticoagulation indefinitely pending further plans for resection.  Acute kidney injury likely due to hypoperfusion: Baseline serum creatinine normal,  presented with creatinine 1.40 Avoid nephrotoxic medications, serum creatinine improving.  History of A-fib on chronic anticoagulation: Patient remains in normal sinus rhythm, but at times has atrial flutter. Continue amiodarone, Continue to hold Eliquis. Follow-up GI as patient will need for further duodenal resection. Will need cardiology input when and more stable given anticoagulation to hold indefinitely for now , pending further GI plans. question if patient needs watchman's device.  Hyperlipidemia: Continue Lipitor.  Essential hypertension: Continue to hold blood pressure medication as patient's blood pressure is on the soft side.  History of  HFpEF: Continue to hold Lasix. Does not appear in acute exacerbation.  Hypothyroidism: Continue levothyroxine.  Depression/anxiety: Continue Zoloft and BuSpar  Chronic debility / Chronic pain: Patient was recently discharged from rehab.  Uses wheelchair or walker at home. PT and OT evaluation. Continue oxycodone as needed for pain control.  Hypokalemia/ Hypomagnesemia:  Replaced.  Continue to monitor  DVT prophylaxis: SCDs Code Status: Full code  Family Communication: Daughter at bedside Disposition Plan:    Status is: Inpatient Remains inpatient appropriate because: Severity of illness.   Consultants:  Gastroenterology IR  Procedures: IR embolization. Antimicrobials:   Anti-infectives (From admission, onward)    None      Subjective: Patient was seen and examined at bedside.  Overnight events noted.   Patient reports doing better.  She denies any further bleeding.  Daughter in the room. H&H remains stable.  Objective: Vitals:   07/24/23 0900 07/24/23 1000 07/24/23 1100 07/24/23 1146  BP: (!) 106/56 (!) 140/68 (!) 110/42   Pulse: (!) 59 (!) 54 63   Resp: 18 16 11    Temp:    98.2 F (36.8 C)  TempSrc:    Oral  SpO2: 98% 100% 95%   Weight:      Height:        Intake/Output Summary (Last 24 hours) at 07/24/2023 1419 Last data filed at 07/24/2023 1307 Gross per 24 hour  Intake 1045.96 ml  Output 3250 ml  Net -2204.04 ml   Filed Weights   07/22/23 0254 07/23/23 0334 07/24/23 0500  Weight: 84.9 kg 85.1 kg 91.8 kg    Examination:  General exam: Appears calm and comfortable, deconditioned, not in any acute distress. Respiratory system: Clear to auscultation. Respiratory effort normal.  RR 15 Cardiovascular system: S1 & S2 heard, RRR. No JVD, murmurs, rubs, gallops or clicks.  Gastrointestinal system: Abdomen is non distended, soft and non tender.  Normal bowel sounds heard. Central nervous system: Alert and oriented X 3. No focal neurological deficits. Extremities: Both Fore arms are bruised, and swollen. Skin: No rashes, lesions or ulcers Psychiatry: Judgement and insight appear normal. Mood & affect appropriate.     Data Reviewed: I have personally reviewed following labs and imaging studies  CBC: Recent Labs  Lab  0000 07/18/23 1542 07/20/23 1213 07/20/23 1401 07/21/23 0830 07/21/23 1136 07/22/23 0826 07/22/23 0827 07/22/23 1553 07/22/23 2208 07/23/23 0913 07/23/23 1607 07/24/23 0312  WBC   < > 4.9 8.8  --  10.8*  --  11.4*  --   --   --  5.7 5.8 6.0  NEUTROABS  --  3.8 7.1  --   --   --  8.4*  --   --   --  4.5  --   --   HGB  --  10.4* 7.8*   < > 10.3*   < > 10.0*  --  9.6* 9.9* 8.7* 9.0* 8.4*  HCT  --  34.2 26.5*    < > 30.2*   < > 30.6*  --  28.5* 28.9* 26.1* 27.4* 26.0*  MCV   < > 97 101.5*  --  87.0  --  91.9  --   --   --  92.2 93.8 93.5  PLT   < > 116* 150  --  99*  --  161 159  --   --  71* PLATELET CLUMPS NOTED ON SMEAR, UNABLE TO ESTIMATE 66*   < > = values in this interval not displayed.   Basic Metabolic Panel: Recent Labs  Lab 07/21/23 0755 07/21/23 0830 07/22/23 0022 07/22/23 1553 07/23/23 0913 07/24/23 0312  NA  --  137 134* 138 136 138  K  --  5.1 4.7 3.7 3.8 2.9*  CL  --  105 104 106 106 106  CO2  --  23 23 24 24 24   GLUCOSE  --  114* 120* 107* 87 95  BUN  --  40* 42* 35* 26* 19  CREATININE  --  1.40* 1.37* 1.17* 1.12* 1.06*  CALCIUM  --  7.7* 7.4* 7.9* 7.8* 7.7*  MG 1.2*  --  2.2  --  2.0 1.6*  PHOS  --   --  4.7*  --   --   --    GFR: Estimated Creatinine Clearance: 44.2 mL/min (A) (by C-G formula based on SCr of 1.06 mg/dL (H)). Liver Function Tests: Recent Labs  Lab 07/18/23 1542 07/20/23 1213 07/21/23 0830 07/22/23 0022  AST 48* 42* 29 24  ALT 30 26 22 19   ALKPHOS 160* 99 65 58  BILITOT 0.3 0.6 1.3* 0.8  PROT 5.7* 4.7* 3.8* 3.4*  ALBUMIN 3.3* 2.4* 2.0* 1.8*   No results for input(s): "LIPASE", "AMYLASE" in the last 168 hours. No results for input(s): "AMMONIA" in the last 168 hours. Coagulation Profile: Recent Labs  Lab 07/20/23 1213 07/21/23 0804 07/22/23 0827  INR 1.6* 1.2 1.2   Cardiac Enzymes: No results for input(s): "CKTOTAL", "CKMB", "CKMBINDEX", "TROPONINI" in the last 168 hours. BNP (last 3 results) No results for input(s): "PROBNP" in the last 8760 hours. HbA1C: No results for input(s): "HGBA1C" in the last 72 hours. CBG: Recent Labs  Lab 07/23/23 2007 07/23/23 2330 07/24/23 0342 07/24/23 0806 07/24/23 1145  GLUCAP 83 82 79 95 84   Lipid Profile: No results for input(s): "CHOL", "HDL", "LDLCALC", "TRIG", "CHOLHDL", "LDLDIRECT" in the last 72 hours. Thyroid Function Tests: No results for input(s): "TSH", "T4TOTAL", "FREET4",  "T3FREE", "THYROIDAB" in the last 72 hours. Anemia Panel: No results for input(s): "VITAMINB12", "FOLATE", "FERRITIN", "TIBC", "IRON", "RETICCTPCT" in the last 72 hours. Sepsis Labs: Recent Labs  Lab 07/21/23 0755 07/23/23 0913  PROCALCITON  --  <0.10  LATICACIDVEN 1.4 0.8    Recent Results (from the past 240 hours)  MRSA Next Gen by PCR, Nasal     Status: None   Collection Time: 07/21/23  3:27 AM   Specimen: Nasal Mucosa; Nasal Swab  Result Value Ref Range Status   MRSA by PCR Next Gen NOT DETECTED NOT DETECTED Final    Comment: (NOTE) The GeneXpert MRSA Assay (FDA approved for NASAL specimens only), is one component of a comprehensive MRSA colonization surveillance program. It is not intended to diagnose MRSA infection nor to guide or monitor treatment for MRSA infections. Test performance is not FDA approved in patients less than 64 years old. Performed at [facility], [address] [place], [place] [address]    Radiology Studies: No results found.  Scheduled Meds:  amiodarone  200 mg Oral Daily   atorvastatin  20 mg Oral Daily   busPIRone  10 mg Oral BID   Chlorhexidine Gluconate Cloth  6 each Topical Daily   levothyroxine  100 mcg Oral Q0600   pantoprazole (PROTONIX) IV  40 mg Intravenous Q12H   sertraline  25 mg Oral Daily   Continuous Infusions:   LOS: 3 days    Time spent: 50 mins    Willeen Niece, MD [facility]   If 7PM-7AM, please contact night-coverage

## 2023-07-24 NOTE — Progress Notes (Signed)
OT Cancellation Note  Patient Details Name: Peggy Armstrong MRN: [id number] DOB: 01-17-1946   Cancelled Treatment:    Reason Eval/Treat Not Completed: Fatigue/lethargy limiting ability to participate. Pt sleeping deeply, granddaughter at bedside. Will continue to follow.   Evern Bio 07/24/2023, 3:40 PM Berna Spare, OTR/L Acute Rehabilitation Services Office: [phone]

## 2023-07-24 NOTE — Anesthesia Postprocedure Evaluation (Signed)
Anesthesia Post Note  Patient: ROBERTA ANGELL  Procedure(s) Performed: ESOPHAGOGASTRODUODENOSCOPY (EGD) WITH PROPOFOL HEMOSTASIS CLIP PLACEMENT HEMOSTASIS CONTROL     Patient location during evaluation: PACU Anesthesia Type: General Level of consciousness: awake and alert Pain management: pain level controlled Vital Signs Assessment: post-procedure vital signs reviewed and stable Respiratory status: spontaneous breathing, nonlabored ventilation, respiratory function stable and patient connected to nasal cannula oxygen Cardiovascular status: stable and blood pressure returned to baseline Postop Assessment: no apparent nausea or vomiting Anesthetic complications: no   No notable events documented.  Last Vitals:  Vitals:   07/24/23 0600 07/24/23 0700  BP: 122/61 (!) 101/51  Pulse: 67 62  Resp: 20 18  Temp:    SpO2: 98% 98%    Last Pain:  Vitals:   07/24/23 0418  TempSrc:   PainSc: Asleep                 Nelle Don Khalea Ventura

## 2023-07-24 NOTE — TOC CM/SW Note (Signed)
Transition of Care [facility]) - Inpatient Brief Assessment   Patient Details  Name: Peggy Armstrong MRN: [id number] Date of Birth: 28-Feb-1946  Transition of Care [facility]) CM/SW Contact:    Tom-Johnson, Hershal Coria, RN Phone Number: 07/24/2023, 5:24 PM   Clinical Narrative:  Patient presented to th ED with dark red blood in stool, admitted with GI Bleed, GI following.   From home with daughter, [name]. Has two supportive children. Has all necessary DME's at home.  PCP is Rothfuss, Teryl Lucy, PA-C and uses [facility] on [address] in [place].   Home health PT recommended, patient states she is active with [facility] and would like to resume disciplines at discharge.  CM call in resumption of care order to [facility] with acceptance noted, info on AVS.  Patient's daughter requesting PTAR at discharge, states patient lives on the second floor of her Apartment and has 15-16 steps. Patient will not be able to climb  stairs to her Apartment.   Patient not Medically ready for discharge.  CM will continue to follow as patient progresses with care towards discharge.           Transition of Care Asessment: Insurance and Status: Insurance coverage has been reviewed Patient has primary care physician: Yes Home environment has been reviewed: Yes Prior level of function:: Modified Independent Prior/Current Home Services: Current home services Social Drivers of Health Review: SDOH reviewed no interventions necessary Readmission risk has been reviewed: Yes Transition of care needs: transition of care needs identified, TOC will continue to follow

## 2023-07-25 DIAGNOSIS — K922 Gastrointestinal hemorrhage, unspecified: Secondary | ICD-10-CM | POA: Diagnosis not present

## 2023-07-25 LAB — GLUCOSE, CAPILLARY
Glucose-Capillary: 76 mg/dL (ref 70–99)
Glucose-Capillary: 84 mg/dL (ref 70–99)
Glucose-Capillary: 90 mg/dL (ref 70–99)
Glucose-Capillary: 87 mg/dL (ref 70–99)
Glucose-Capillary: 85 mg/dL (ref 70–99)

## 2023-07-25 LAB — BASIC METABOLIC PANEL
Anion gap: 6 (ref 5–15)
BUN: 12 mg/dL (ref 8–23)
CO2: 25 mmol/L (ref 22–32)
Calcium: 7.6 mg/dL — ABNORMAL LOW (ref 8.9–10.3)
Chloride: 104 mmol/L (ref 98–111)
Creatinine, Ser: 0.82 mg/dL (ref 0.44–1.00)
GFR, Estimated: >60 mL/min (ref >60)
Glucose, Bld: 87 mg/dL (ref 70–99)
Potassium: 3.5 mmol/L (ref 3.5–5.1)
Sodium: 135 mmol/L (ref 135–145)

## 2023-07-25 LAB — CBC
HCT: 27.2 % — ABNORMAL LOW (ref 36.0–46.0)
Hemoglobin: 8.7 g/dL — ABNORMAL LOW (ref 12.0–15.0)
MCH: 30.5 pg (ref 26.0–34.0)
MCHC: 32 g/dL (ref 30.0–36.0)
MCV: 95.4 fL (ref 80.0–100.0)
Platelets: 73 10*3/uL — ABNORMAL LOW (ref 150–400)
RBC: 2.85 MIL/uL — ABNORMAL LOW (ref 3.87–5.11)
RDW: 15.5 % (ref 11.5–15.5)
WBC: 4.9 10*3/uL (ref 4.0–10.5)
nRBC: 0 % (ref 0.0–0.2)

## 2023-07-25 LAB — MAGNESIUM: Magnesium: 2 mg/dL (ref 1.7–2.4)

## 2023-07-25 LAB — PHOSPHORUS: Phosphorus: 2.8 mg/dL (ref 2.5–4.6)

## 2023-07-25 MED ORDER — MELATONIN 3 MG PO TABS
3.0000 mg | ORAL_TABLET | Freq: Every evening | ORAL | Status: DC | PRN
  Administered 2023-07-25 – 2023-07-26 (×2): 3 mg via ORAL
  Filled 2023-07-25 (×2): qty 1

## 2023-07-25 MED ORDER — DIPHENHYDRAMINE-ZINC ACETATE 2-0.1 % EX CREA
TOPICAL_CREAM | Freq: Three times a day (TID) | CUTANEOUS | Status: DC | PRN
  Filled 2023-07-25 (×2): qty 28

## 2023-07-25 NOTE — Evaluation (Signed)
Occupational Therapy Evaluation Patient Details Name: Peggy Armstrong MRN: [id number] DOB: [birth year] Today's Date: 07/25/2023   History of Present Illness Peggy Armstrong is a 78 y.o. female with recent admissions for recurrent GI bleeding requiring blood transfusion with most recent admission and discharge from 06/20/2023-06/21/2023 for GI bleeding with hemoglobin of 5.8 requiring 2 units packed red cell transfusion and Eliquis held during and after discharge.  She apparently had an EGD done about a week before admission at [facility] and Eliquis was subsequently resumed.  She presented with bloody diarrhea. PHMx: L2 compression fx, A-fib, DVT, HTN, RA, gout.   Clinical Impression   This 78 yo female admitted with above presents to acute OT with PLOF of being able to do her own basic ADLs at a sponge bath level at an Mod I to independent level. Currently she is setup/S-mod A for basic ADLs due to back precautions without the use of AE. She will continue to benefit from acute OT with follow up HHOT.       If plan is discharge home, recommend the following: A little help with walking and/or transfers;A lot of help with bathing/dressing/bathroom;Assistance with cooking/housework;Assist for transportation;Help with stairs or ramp for entrance    Functional Status Assessment  Patient has had a recent decline in their functional status and demonstrates the ability to make significant improvements in function in a reasonable and predictable amount of time.  Equipment Recommendations  None recommended by OT       Precautions / Restrictions Precautions Precautions: Back Precaution Comments: Back prec for comfort, with L2 compression fx Required Braces or Orthoses: Spinal Brace Spinal Brace: Applied in sitting position Restrictions Weight Bearing Restrictions Per Provider Order: No Other Position/Activity Restrictions: Uses the brace when up and walking; pt can don brace independently but was  impeded by tall bed this session and pt with small stature      Mobility Bed Mobility Overal bed mobility: Needs Assistance Bed Mobility: Rolling, Sidelying to Sit Rolling: Contact guard assist, Used rails (HOB) Sidelying to sit: Contact guard assist, HOB elevated, Used rails            Transfers Overall transfer level: Needs assistance Equipment used: Rolling walker (2 wheels) Transfers: Sit to/from Stand Sit to Stand: Contact guard assist, From elevated surface (bed at regular height is tall for her)                  Balance Overall balance assessment: Needs assistance Sitting-balance support: No upper extremity supported, Feet supported Sitting balance-Leahy Scale: Good     Standing balance support: Reliant on assistive device for balance, Bilateral upper extremity supported Standing balance-Leahy Scale: Poor                             ADL either performed or assessed with clinical judgement   ADL Overall ADL's : Needs assistance/impaired Eating/Feeding: Independent;Sitting   Grooming: Set up;Sitting   Upper Body Bathing: Set up;Sitting   Lower Body Bathing: Moderate assistance Lower Body Bathing Details (indicate cue type and reason): CGA a sit<>stand Upper Body Dressing : Set up;Sitting   Lower Body Dressing: Moderate assistance Lower Body Dressing Details (indicate cue type and reason): CGA a sit<>stand Toilet Transfer: Contact guard assist;Ambulation;Rolling walker (2 wheels) Toilet Transfer Details (indicate cue type and reason): simulated bed>around unit (3MW)>sit in chair behind her when she got back to her room Toileting- Clothing Manipulation and Hygiene: Moderate assistance Toileting -  Clothing Manipulation Details (indicate cue type and reason): CGA a sit<>stand             Vision Baseline Vision/History: 1 Wears glasses Ability to See in Adequate Light: 0 Adequate Patient Visual Report: No change from baseline               Pertinent Vitals/Pain Pain Assessment Pain Assessment: No/denies pain     Extremity/Trunk Assessment Upper Extremity Assessment Upper Extremity Assessment: Overall WFL for tasks assessed           Communication Communication Communication: No apparent difficulties   Cognition Arousal: Alert Behavior During Therapy: WFL for tasks assessed/performed Overall Cognitive Status: Within Functional Limits for tasks assessed                                                  Home Living Family/patient expects to be discharged to:: Private residence Living Arrangements: Children (dtr) Available Help at Discharge: Family;Available PRN/intermittently (dtr works nearby) Type of Home: Apartment Home Access: Stairs to enter [employer] of Steps: flight Entrance Stairs-Rails: Right;Left Home Layout: One level     Bathroom Shower/Tub: Tub/shower unit         Home Equipment: Shower seat;Grab bars - Chartered loss adjuster (2 wheels);Cane - single point;Hospital bed   Additional Comments: Has borrowed a w/c but does not have leg rest.  Wants to look into getting w/c      Prior Functioning/Environment Prior Level of Function : Needs assist             Mobility Comments: transfers bed to wc independently, and uses RW to get around home when alone; Only walks when she has standby assist, and she uses a RW; Before she injured back - walked with RW or w/c in apt independently (w/c if gout flair); for stairs does have min A at all times ADLs Comments: Could dress self and toilet (slip on shoes); had supervision in/out of tub but then bathed independently        OT Problem List: Decreased range of motion;Impaired balance (sitting and/or standing);Obesity      OT Treatment/Interventions: Self-care/ADL training;DME and/or AE instruction;Patient/family education;Balance training    OT Goals(Current goals can be found in the care plan section) Acute  Rehab OT Goals Patient Stated Goal: to be able to get back home soon and get up and down stairs OT Goal Formulation: With patient Time For Goal Achievement: 08/07/23 Potential to Achieve Goals: Good  OT Frequency: Min 1X/week    Co-evaluation PT/OT/SLP Co-Evaluation/Treatment: Yes Reason for Co-Treatment: For patient/therapist safety;To address functional/ADL transfers (needed +2 for minimal mobility last time seen) PT goals addressed during session: Mobility/safety with mobility;Balance;Proper use of DME;Strengthening/ROM OT goals addressed during session: Strengthening/ROM;ADL's and self-care      AM-PAC OT "6 Clicks" Daily Activity     Outcome Measure Help from another person eating meals?: None Help from another person taking care of personal grooming?: A Little Help from another person toileting, which includes using toliet, bedpan, or urinal?: A Lot Help from another person bathing (including washing, rinsing, drying)?: A Lot Help from another person to put on and taking off regular upper body clothing?: A Little Help from another person to put on and taking off regular lower body clothing?: A Lot 6 Click Score: 16   End of Session Equipment Utilized During Treatment: Gait  belt;Rolling walker (2 wheels) Nurse Communication: Mobility status  Activity Tolerance: Patient tolerated treatment well Patient left: in chair;with call bell/phone within reach;with chair alarm set;with family/visitor present  OT Visit Diagnosis: Unsteadiness on feet (R26.81);Other abnormalities of gait and mobility (R26.89);Muscle weakness (generalized) (M62.81)                Time: 7829-5621 OT Time Calculation (min): 23 min Charges:  OT General Charges $OT Visit: 1 Visit OT Evaluation $OT Eval Moderate Complexity: 1 Mod  Cathy L. OT Acute Rehabilitation Services Office [phone]    Evette Georges 07/25/2023, 1:49 PM

## 2023-07-25 NOTE — Progress Notes (Signed)
Physical Therapy Treatment Patient Details Name: Peggy Armstrong MRN: [id number] DOB: 1945-12-25 Today's Date: 07/25/2023   History of Present Illness Patient presenting to the ED with recurrent bloody stools on 07/20/23. Admitted with a GI bleed, and has required 4 units PRBCs. EGD on 1/24. GDA branch embolization 1/25. PMH includes: HTN, HLD, chronic diastolic heart failure, RA, prior history of DVT, chronic afib, duodenal adenoma, L2 compression fracture with recent admissions for recurrent GI bleeding requiring blood transfusion in 12/24.    PT Comments  Pt tolerates treatment well, demonstrating much improved mobility quality and tolerance. Pt does not require physical assistance to mobilize this session, only appearing to need assistance for brace management and donning shoes due to taller bed height. Pt will benefit from continued frequent mobilization in an effort to improve activity tolerance and strength. PT recommends discharge home with HHPT when medically appropriate.    If plan is discharge home, recommend the following: A lot of help with bathing/dressing/bathroom;Assistance with cooking/housework;Help with stairs or ramp for entrance;A little help with walking and/or transfers   Can travel by private vehicle        Equipment Recommendations  None recommended by PT    Recommendations for Other Services       Precautions / Restrictions Precautions Precautions: Back Precaution Booklet Issued: Yes (comment) Precaution Comments: Back prec for comfort, with L2 compression fx Required Braces or Orthoses: Spinal Brace Spinal Brace: Applied in sitting position Restrictions Weight Bearing Restrictions Per Provider Order: No Other Position/Activity Restrictions: Uses the brace when up and walking; pt can don brace independently but was impeded by tall bed this session and pt with small stature     Mobility  Bed Mobility Overal bed mobility: Needs Assistance Bed Mobility:  Rolling, Sidelying to Sit Rolling: Contact guard assist, Used rails Sidelying to sit: Contact guard assist, HOB elevated, Used rails            Transfers Overall transfer level: Needs assistance Equipment used: Rolling walker (2 wheels) Transfers: Sit to/from Stand Sit to Stand: Contact guard assist, From elevated surface                Ambulation/Gait Ambulation/Gait assistance: Contact guard assist Gait Distance (Feet): 300 Feet Assistive device: Rolling walker (2 wheels) Gait Pattern/deviations: Step-through pattern Gait velocity: functional Gait velocity interpretation: 1.31 - 2.62 ft/sec, indicative of limited community ambulator   General Gait Details: step-through gait, increased trunk flexion at times however pt stops multiple times to independently correct posture   Stairs             Wheelchair Mobility     Tilt Bed    Modified Rankin (Stroke Patients Only)       Balance Overall balance assessment: Needs assistance Sitting-balance support: No upper extremity supported, Feet supported Sitting balance-Leahy Scale: Good     Standing balance support: Single extremity supported, Reliant on assistive device for balance Standing balance-Leahy Scale: Poor                              Cognition Arousal: Alert Behavior During Therapy: WFL for tasks assessed/performed Overall Cognitive Status: Within Functional Limits for tasks assessed                                          Exercises      General  Comments General comments (skin integrity, edema, etc.): VSS on RA      Pertinent Vitals/Pain Pain Assessment Pain Assessment: No/denies pain    Home Living Family/patient expects to be discharged to:: Private residence Living Arrangements: Children (dtr) Available Help at Discharge: Family;Available PRN/intermittently (dtr works nearby) Type of Home: Apartment Home Access: Stairs to enter Entrance  Stairs-Rails: Doctor, general practice of Steps: flight   Home Layout: One level Home Equipment: Shower seat;Grab bars - Chartered loss adjuster (2 wheels);Cane - single point;Hospital bed Additional Comments: Has borrowed a w/c but does not have leg rest.  Wants to look into getting w/c    Prior Function            PT Goals (current goals can now be found in the care plan section) Acute Rehab PT Goals Patient Stated Goal: wants to be able to get straight home Progress towards PT goals: Progressing toward goals    Frequency    Min 1X/week      PT Plan      Co-evaluation PT/OT/SLP Co-Evaluation/Treatment: Yes Reason for Co-Treatment: For patient/therapist safety;To address functional/ADL transfers PT goals addressed during session: Mobility/safety with mobility;Balance;Proper use of DME;Strengthening/ROM OT goals addressed during session: Strengthening/ROM;ADL's and self-care      AM-PAC PT "6 Clicks" Mobility   Outcome Measure  Help needed turning from your back to your side while in a flat bed without using bedrails?: A Little Help needed moving from lying on your back to sitting on the side of a flat bed without using bedrails?: A Little Help needed moving to and from a bed to a chair (including a wheelchair)?: A Little Help needed standing up from a chair using your arms (e.g., wheelchair or bedside chair)?: A Little Help needed to walk in hospital room?: A Little Help needed climbing 3-5 steps with a railing? : A Lot 6 Click Score: 17    End of Session Equipment Utilized During Treatment: Gait belt;Back brace Activity Tolerance: Patient tolerated treatment well Patient left: in chair;with call bell/phone within reach;with chair alarm set;with family/visitor present Nurse Communication: Mobility status PT Visit Diagnosis: Unsteadiness on feet (R26.81);Other abnormalities of gait and mobility (R26.89);Muscle weakness (generalized) (M62.81)     Time:  1610-9604 PT Time Calculation (min) (ACUTE ONLY): 23 min  Charges:    $Gait Training: 8-22 mins PT General Charges $$ ACUTE PT VISIT: 1 Visit                     Arlyss Gandy, PT, DPT Acute Rehabilitation Office [phone]    Arlyss Gandy 07/25/2023, 1:57 PM

## 2023-07-25 NOTE — Progress Notes (Signed)
TRH night cross cover note:   I was notified by RN of the patient's request for melatonin as sleep aid. I subsequently placed new order for prn melatonin for insomnia.    Newton Pigg, DO Hospitalist

## 2023-07-25 NOTE — Progress Notes (Signed)
PROGRESS NOTE    Peggy Armstrong  ZHY:[id number] DOB: [birth year] DOA: 07/20/2023 PCP: Dominic Pea, PA-C   Brief Narrative:  This 78 years old female with PMH significant for HTN, HLD, diastolic HF, hx. DVT, Afib on eliquis and recurrent GIB, who presented to ED on  07/20/23 with bloody diarrhea. 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 Eliquis resumed 4days later.  Patient was hypotensive in the ED and was given 1 unit PRBC and Kcentra.  CT angio shows active bleeding within the second and third portions of duodenum.  GI and IR was consulted.  It was felt too proximal for IR intervention.  Patient was evaluated by GI, was given 1 unit of PRBC.  Patient was scheduled for EGD on 1/24 by Dr. Chales Abrahams.   On 1/23 night patient started having frequent dark bloody bowel movements.  Hemoglobin dropped significantly.  Patient was transfused with 4 units PRBC and was given second dose of Kcentra.  Patient was admitted in the ICU.  Now patient is hemodynamically stable TRH pickup 07/24/2023.  Significant hospital events: 1/14 incomplete mucosal resection of duodenal adenoma at [facility]. 1/18 eliquis resumed 1/23 to ED w dark bloody BM, CT angio with active extrav. admit to [facility]. Kcentra and 2 PRBC. GI consult 1/24 incr output. Hypotensive. 2nd dose K Centra. Additional 5 PRBC. PCCM consult.  EGD w/ GI> duodenal ulcer clipped 1/25 rebled, 2PRBC,1FFP, 1plt, requiring pressors, s/p GDA branches embolization x3 1/27 TRH pickup, H&H remains stable.  Assessment & Plan:   Principal Problem:   GI bleed Active Problems:   Hemorrhagic shock (HCC)   ABLA (acute blood loss anemia)   Acute GI bleeding   AKI (acute kidney injury) (HCC)  Hemorrhagic shock , Acute duodenal bleeding: Acute blood loss anemia on chronic anemia: Patient is status post K centra x 2, cumulative PRBC x 8, FFP, Plt 1. Status post EGD 1/24 shows post EMR ulcers, 1 visible  vessel status post clipping,  multiple gastric polyps. Patient continued to have bleed, status post IR embolization x 3 of GDA branches 1/25. Appreciate GI and IR assistance. Successfully weaned down to Levophed.  Maintaining goal MAP of above 65.   Patient started on clear liquid diet, advance as tolerated. H&H remains stable,  Keep it above 7 gms.  Continue pantoprazole 40 twice daily Continue to hold anticoagulation indefinitely pending further plans for resection.  Acute kidney injury likely due to hypoperfusion: Baseline serum creatinine normal,  presented with creatinine 1.40 Avoid nephrotoxic medications, serum creatinine improved.  AKI resolved.  History of A-fib on chronic anticoagulation: Patient remains in normal sinus rhythm, but at times has atrial flutter. Continue amiodarone, Continue to hold Eliquis. Follow-up GI as patient will need for further duodenal resection. Will need cardiology input when and more stable given anticoagulation to hold indefinitely for now , pending further GI plans. question if patient needs watchman's device.  Hyperlipidemia: Continue Lipitor.  Essential hypertension: Continue to hold blood pressure medication as patient's blood pressure is on the soft side.  History of  HFpEF: Continue to hold Lasix. Does not appear in acute exacerbation.  Hypothyroidism: Continue levothyroxine.  Depression/anxiety: Continue Zoloft and BuSpar.  Chronic debility / Chronic pain: Patient was recently discharged from rehab.  Uses wheelchair or walker at home. PT and OT evaluation. Continue oxycodone as needed for pain control.  Hypokalemia/ Hypomagnesemia:  Replaced.  Continue to monitor  DVT prophylaxis: SCDs Code  Status: Full code Family Communication: Daughter at bedside Disposition Plan:    Status is: Inpatient Remains inpatient appropriate because: Severity of illness.   Consultants:  Gastroenterology IR  Procedures: IR  embolization. Antimicrobials:  Anti-infectives (From admission, onward)    None      Subjective: Patient was seen and examined at bedside.  Overnight events noted.   Patient reports feeling much improved,  She has slept much better,  she denies any further bleeding.   Daughter in the room. H&H remains stable.  Objective: Vitals:   07/25/23 1000 07/25/23 1100 07/25/23 1200 07/25/23 1215  BP: 126/66  135/65   Pulse: 65 62 65   Resp: (!) 21 17 (!) 21   Temp:    97.6 F (36.4 C)  TempSrc:    Oral  SpO2: 93% 92% 96%   Weight:      Height:        Intake/Output Summary (Last 24 hours) at 07/25/2023 1352 Last data filed at 07/25/2023 0800 Gross per 24 hour  Intake 580 ml  Output 1400 ml  Net -820 ml   Filed Weights   07/22/23 0254 07/23/23 0334 07/24/23 0500  Weight: 84.9 kg 85.1 kg 91.8 kg    Examination:  General exam: Appears comfortable, deconditioned, not in any acute distress. Respiratory system: CTA bilaterally. Respiratory effort normal.  RR 15 Cardiovascular system: S1 & S2 heard, RRR. No JVD, murmurs, rubs, gallops or clicks.  Gastrointestinal system: Abdomen is non distended, soft and non tender.  Normal bowel sounds heard. Central nervous system: Alert and oriented X 3. No focal neurological deficits. Extremities: Both Fore arms are bruised, and swollen. Skin: No rashes, lesions or ulcers Psychiatry: Judgement and insight appear normal. Mood & affect appropriate.     Data Reviewed: I have personally reviewed following labs and imaging studies  CBC: Recent Labs  Lab 07/18/23 1542 07/20/23 1213 07/20/23 1401 07/22/23 0826 07/22/23 0827 07/22/23 1553 07/22/23 2208 07/23/23 0913 07/23/23 1607 07/24/23 0312 07/25/23 0544  WBC 4.9 8.8   < > 11.4*  --   --   --  5.7 5.8 6.0 4.9  NEUTROABS 3.8 7.1  --  8.4*  --   --   --  4.5  --   --   --   HGB 10.4* 7.8*   < > 10.0*  --    < > 9.9* 8.7* 9.0* 8.4* 8.7*  HCT 34.2 26.5*   < > 30.6*  --    < > 28.9*  26.1* 27.4* 26.0* 27.2*  MCV 97 101.5*   < > 91.9  --   --   --  92.2 93.8 93.5 95.4  PLT 116* 150   < > 161 159  --   --  71* PLATELET CLUMPS NOTED ON SMEAR, UNABLE TO ESTIMATE 66* 73*   < > = values in this interval not displayed.   Basic Metabolic Panel: Recent Labs  Lab 07/21/23 0755 07/21/23 0830 07/22/23 0022 07/22/23 1553 07/23/23 0913 07/24/23 0312 07/25/23 0544  NA  --    < > 134* 138 136 138 135  K  --    < > 4.7 3.7 3.8 2.9* 3.5  CL  --    < > 104 106 106 106 104  CO2  --    < > 23 24 24 24 25   GLUCOSE  --    < > 120* 107* 87 95 87  BUN  --    < > 42* 35* 26* 19  12  CREATININE  --    < > 1.37* 1.17* 1.12* 1.06* 0.82  CALCIUM  --    < > 7.4* 7.9* 7.8* 7.7* 7.6*  MG 1.2*  --  2.2  --  2.0 1.6* 2.0  PHOS  --   --  4.7*  --   --   --  2.8   < > = values in this interval not displayed.   GFR: Estimated Creatinine Clearance: 57.1 mL/min (by C-G formula based on SCr of 0.82 mg/dL). Liver Function Tests: Recent Labs  Lab 07/18/23 1542 07/20/23 1213 07/21/23 0830 07/22/23 0022  AST 48* 42* 29 24  ALT 30 26 22 19   ALKPHOS 160* 99 65 58  BILITOT 0.3 0.6 1.3* 0.8  PROT 5.7* 4.7* 3.8* 3.4*  ALBUMIN 3.3* 2.4* 2.0* 1.8*   No results for input(s): "LIPASE", "AMYLASE" in the last 168 hours. No results for input(s): "AMMONIA" in the last 168 hours. Coagulation Profile: Recent Labs  Lab 07/20/23 1213 07/21/23 0804 07/22/23 0827  INR 1.6* 1.2 1.2   Cardiac Enzymes: No results for input(s): "CKTOTAL", "CKMB", "CKMBINDEX", "TROPONINI" in the last 168 hours. BNP (last 3 results) No results for input(s): "PROBNP" in the last 8760 hours. HbA1C: No results for input(s): "HGBA1C" in the last 72 hours. CBG: Recent Labs  Lab 07/24/23 1929 07/24/23 2334 07/25/23 0329 07/25/23 0730 07/25/23 1144  GLUCAP 113* 104* 76 84 90   Lipid Profile: No results for input(s): "CHOL", "HDL", "LDLCALC", "TRIG", "CHOLHDL", "LDLDIRECT" in the last 72 hours. Thyroid Function  Tests: No results for input(s): "TSH", "T4TOTAL", "FREET4", "T3FREE", "THYROIDAB" in the last 72 hours. Anemia Panel: No results for input(s): "VITAMINB12", "FOLATE", "FERRITIN", "TIBC", "IRON", "RETICCTPCT" in the last 72 hours. Sepsis Labs: Recent Labs  Lab 07/21/23 0755 07/23/23 0913  PROCALCITON  --  <0.10  LATICACIDVEN 1.4 0.8    Recent Results (from the past 240 hours)  MRSA Next Gen by PCR, Nasal     Status: None   Collection Time: 07/21/23  3:27 AM   Specimen: Nasal Mucosa; Nasal Swab  Result Value Ref Range Status   MRSA by PCR Next Gen NOT DETECTED NOT DETECTED Final    Comment: (NOTE) The GeneXpert MRSA Assay (FDA approved for NASAL specimens only), is one component of a comprehensive MRSA colonization surveillance program. It is not intended to diagnose MRSA infection nor to guide or monitor treatment for MRSA infections. Test performance is not FDA approved in patients less than 96 years old. Performed at [facility], [address] [place], [place] [address]    Radiology Studies: No results found.  Scheduled Meds:  amiodarone  200 mg Oral Daily   atorvastatin  20 mg Oral Daily   busPIRone  10 mg Oral BID   Chlorhexidine Gluconate Cloth  6 each Topical Daily   levothyroxine  100 mcg Oral Q0600   pantoprazole (PROTONIX) IV  40 mg Intravenous Q12H   sertraline  25 mg Oral Daily   Continuous Infusions:   LOS: 4 days    Time spent: 35 mins    Willeen Niece, MD [facility]   If 7PM-7AM, please contact night-coverage

## 2023-07-26 DIAGNOSIS — K922 Gastrointestinal hemorrhage, unspecified: Secondary | ICD-10-CM | POA: Diagnosis not present

## 2023-07-26 LAB — GLUCOSE, CAPILLARY
Glucose-Capillary: 123 mg/dL — ABNORMAL HIGH (ref 70–99)
Glucose-Capillary: 84 mg/dL (ref 70–99)
Glucose-Capillary: 84 mg/dL (ref 70–99)
Glucose-Capillary: 93 mg/dL (ref 70–99)
Glucose-Capillary: 96 mg/dL (ref 70–99)

## 2023-07-26 LAB — CBC
HCT: 26 % — ABNORMAL LOW (ref 36.0–46.0)
Hemoglobin: 8.4 g/dL — ABNORMAL LOW (ref 12.0–15.0)
MCH: 30.9 pg (ref 26.0–34.0)
MCHC: 32.3 g/dL (ref 30.0–36.0)
MCV: 95.6 fL (ref 80.0–100.0)
Platelets: 76 10*3/uL — ABNORMAL LOW (ref 150–400)
RBC: 2.72 MIL/uL — ABNORMAL LOW (ref 3.87–5.11)
RDW: 15.2 % (ref 11.5–15.5)
WBC: 4.8 10*3/uL (ref 4.0–10.5)
nRBC: 0 % (ref 0.0–0.2)

## 2023-07-26 MED ORDER — DIPHENHYDRAMINE HCL 25 MG PO CAPS
25.0000 mg | ORAL_CAPSULE | Freq: Four times a day (QID) | ORAL | Status: DC | PRN
  Administered 2023-07-26 – 2023-07-27 (×5): 25 mg via ORAL
  Filled 2023-07-26 (×5): qty 1

## 2023-07-26 NOTE — Progress Notes (Signed)
Patient and all belongings - including back brace, cell phone and glasses - transferred to 6N. Vital signs stable at the time and receiving RN in the room before this RN left the patient.

## 2023-07-26 NOTE — Progress Notes (Signed)
PROGRESS NOTE    Peggy Armstrong  ZOX:[id number] DOB: 12/23/1945 DOA: 07/20/2023 PCP: Dominic Pea, PA-C   Brief Narrative:  This 78 years old female with PMH significant for HTN, HLD, diastolic HF, hx. DVT, Afib on eliquis and recurrent GIB, who presented to ED on  07/20/23 with bloody diarrhea. 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 Eliquis resumed 4days later.  Patient was hypotensive in the ED and was given 1 unit PRBC and Kcentra.  CT angio shows active bleeding within the second and third portions of duodenum.  GI and IR was consulted.  It was felt too proximal for IR intervention.  Patient was evaluated by GI, was given 1 unit of PRBC.  Patient was scheduled for EGD on 1/24 by Dr. Chales Abrahams.   On 1/23 night patient started having frequent dark bloody bowel movements.  Hemoglobin dropped significantly.  Patient was transfused with 4 units PRBC and was given second dose of Kcentra.  Patient was admitted in the ICU.  Now patient is hemodynamically stable TRH pickup 07/24/2023.  Significant hospital events: 1/14 incomplete mucosal resection of duodenal adenoma at [facility]. 1/18 eliquis resumed 1/23 to ED w dark bloody BM, CT angio with active extrav. admit to [facility]. Kcentra and 2 PRBC. GI consult 1/24 incr output. Hypotensive. 2nd dose K Centra. Additional 5 PRBC. PCCM consult.  EGD w/ GI> duodenal ulcer clipped 1/25 rebled, 2PRBC,1FFP, 1plt, requiring pressors, s/p GDA branches embolization x3 1/27 TRH pickup, H&H remains stable.  Assessment & Plan:   Principal Problem:   GI bleed Active Problems:   Hemorrhagic shock (HCC)   ABLA (acute blood loss anemia)   Acute GI bleeding   AKI (acute kidney injury) (HCC)  Hemorrhagic shock , Acute duodenal bleeding: Acute blood loss anemia on chronic anemia: Patient is status post K centra x 2, cumulative PRBC x 8, FFP, Plt 1. Status post EGD 1/24 shows post EMR ulcers, 1 visible  vessel status post clipping,  multiple gastric polyps. Patient continued to have bleed, status post IR embolization x 3 of GDA branches 1/25. Appreciate GI and IR assistance. Successfully weaned down to Levophed.  Maintaining goal MAP of above 65.   Patient started on clear liquid diet, advance as tolerated. H&H remains stable,  Keep it above 7 gms.  Continue pantoprazole 40 twice daily As per GI continue to hold anticoagulation,  resume 2 weeks after EGD.  Acute kidney injury likely due to hypoperfusion: Baseline serum creatinine normal,  presented with creatinine 1.40 Avoid nephrotoxic medications, serum creatinine improved.  AKI resolved.  History of A-fib on chronic anticoagulation: Patient remains in normal sinus rhythm, but at times has atrial flutter. Continue amiodarone, Continue to hold Eliquis. Follow-up GI as patient will need for further duodenal resection. Will need cardiology input when and more stable given anticoagulation to hold indefinitely for now , pending further GI plans. question if patient needs watchman's device.  Hyperlipidemia: Continue Lipitor.  Essential hypertension: Continue to hold blood pressure medication as patient's blood pressure is on the soft side.  History of  HFpEF: Continue to hold Lasix. Does not appear in acute exacerbation.  Hypothyroidism: Continue levothyroxine.  Depression/anxiety: Continue Zoloft and BuSpar.  Chronic debility / Chronic pain: Patient was recently discharged from rehab.  Uses wheelchair or walker at home. PT and OT evaluation. Continue oxycodone as needed for pain control.  Hypokalemia/ Hypomagnesemia:  Replaced.  Continue to monitor  DVT  prophylaxis: SCDs Code Status: Full code Family Communication: Daughter at bedside Disposition Plan:    Status is: Inpatient Remains inpatient appropriate because: Severity of illness.   Consultants:  Gastroenterology IR  Procedures: IR  embolization. Antimicrobials:  Anti-infectives (From admission, onward)    None      Subjective: Patient was seen and examined at bedside.  Overnight events noted.   Patient reports feeling much improved.  She denies any further bleeding.  Participated with physical therapy. Daughter in the room. H&H remains stable.  Objective: Vitals:   07/26/23 0845 07/26/23 0922 07/26/23 1147 07/26/23 1200  BP: (!) 161/72 (!) 147/60  (!) 140/60  Pulse: 66 60  62  Resp: (!) 22 (!) 22  17  Temp:   97.9 F (36.6 C)   TempSrc:   Oral   SpO2: 95% 96%  93%  Weight:      Height:        Intake/Output Summary (Last 24 hours) at 07/26/2023 1415 Last data filed at 07/26/2023 1300 Gross per 24 hour  Intake --  Output 1700 ml  Net -1700 ml   Filed Weights   07/22/23 0254 07/23/23 0334 07/24/23 0500  Weight: 84.9 kg 85.1 kg 91.8 kg    Examination:  General exam: Appears comfortable, deconditioned, not in any acute distress. Respiratory system: CTA bilaterally. Respiratory effort normal.  RR 15 Cardiovascular system: S1 & S2 heard, RRR. No JVD, murmurs, rubs, gallops or clicks.  Gastrointestinal system: Abdomen is non distended, soft and non tender.  Normal bowel sounds heard. Central nervous system: Alert and oriented X 3. No focal neurological deficits. Extremities: Both Fore arms are bruised, and swollen. Skin: No rashes, lesions or ulcers Psychiatry: Judgement and insight appear normal. Mood & affect appropriate.     Data Reviewed: I have personally reviewed following labs and imaging studies  CBC: Recent Labs  Lab 07/20/23 1213 07/20/23 1401 07/22/23 0826 07/22/23 0827 07/23/23 0913 07/23/23 1607 07/24/23 0312 07/25/23 0544 07/26/23 0245  WBC 8.8   < > 11.4*  --  5.7 5.8 6.0 4.9 4.8  NEUTROABS 7.1  --  8.4*  --  4.5  --   --   --   --   HGB 7.8*   < > 10.0*   < > 8.7* 9.0* 8.4* 8.7* 8.4*  HCT 26.5*   < > 30.6*   < > 26.1* 27.4* 26.0* 27.2* 26.0*  MCV 101.5*   < > 91.9  --   92.2 93.8 93.5 95.4 95.6  PLT 150   < > 161   < > 71* PLATELET CLUMPS NOTED ON SMEAR, UNABLE TO ESTIMATE 66* 73* 76*   < > = values in this interval not displayed.   Basic Metabolic Panel: Recent Labs  Lab 07/21/23 0755 07/21/23 0830 07/22/23 0022 07/22/23 1553 07/23/23 0913 07/24/23 0312 07/25/23 0544  NA  --    < > 134* 138 136 138 135  K  --    < > 4.7 3.7 3.8 2.9* 3.5  CL  --    < > 104 106 106 106 104  CO2  --    < > 23 24 24 24 25   GLUCOSE  --    < > 120* 107* 87 95 87  BUN  --    < > 42* 35* 26* 19 12  CREATININE  --    < > 1.37* 1.17* 1.12* 1.06* 0.82  CALCIUM  --    < > 7.4* 7.9* 7.8* 7.7* 7.6*  MG 1.2*  --  2.2  --  2.0 1.6* 2.0  PHOS  --   --  4.7*  --   --   --  2.8   < > = values in this interval not displayed.   GFR: Estimated Creatinine Clearance: 57.1 mL/min (by C-G formula based on SCr of 0.82 mg/dL). Liver Function Tests: Recent Labs  Lab 07/20/23 1213 07/21/23 0830 07/22/23 0022  AST 42* 29 24  ALT 26 22 19   ALKPHOS 99 65 58  BILITOT 0.6 1.3* 0.8  PROT 4.7* 3.8* 3.4*  ALBUMIN 2.4* 2.0* 1.8*   No results for input(s): "LIPASE", "AMYLASE" in the last 168 hours. No results for input(s): "AMMONIA" in the last 168 hours. Coagulation Profile: Recent Labs  Lab 07/20/23 1213 07/21/23 0804 07/22/23 0827  INR 1.6* 1.2 1.2   Cardiac Enzymes: No results for input(s): "CKTOTAL", "CKMB", "CKMBINDEX", "TROPONINI" in the last 168 hours. BNP (last 3 results) No results for input(s): "PROBNP" in the last 8760 hours. HbA1C: No results for input(s): "HGBA1C" in the last 72 hours. CBG: Recent Labs  Lab 07/25/23 1958 07/26/23 0003 07/26/23 0412 07/26/23 0759 07/26/23 1146  GLUCAP 85 96 84 93 84   Lipid Profile: No results for input(s): "CHOL", "HDL", "LDLCALC", "TRIG", "CHOLHDL", "LDLDIRECT" in the last 72 hours. Thyroid Function Tests: No results for input(s): "TSH", "T4TOTAL", "FREET4", "T3FREE", "THYROIDAB" in the last 72 hours. Anemia Panel: No  results for input(s): "VITAMINB12", "FOLATE", "FERRITIN", "TIBC", "IRON", "RETICCTPCT" in the last 72 hours. Sepsis Labs: Recent Labs  Lab 07/21/23 0755 07/23/23 0913  PROCALCITON  --  <0.10  LATICACIDVEN 1.4 0.8    Recent Results (from the past 240 hours)  MRSA Next Gen by PCR, Nasal     Status: None   Collection Time: 07/21/23  3:27 AM   Specimen: Nasal Mucosa; Nasal Swab  Result Value Ref Range Status   MRSA by PCR Next Gen NOT DETECTED NOT DETECTED Final    Comment: (NOTE) The GeneXpert MRSA Assay (FDA approved for NASAL specimens only), is one component of a comprehensive MRSA colonization surveillance program. It is not intended to diagnose MRSA infection nor to guide or monitor treatment for MRSA infections. Test performance is not FDA approved in patients less than 33 years old. Performed at [facility], [address], [place], [place] [address]    Radiology Studies: No results found.  Scheduled Meds:  amiodarone  200 mg Oral Daily   atorvastatin  20 mg Oral Daily   busPIRone  10 mg Oral BID   Chlorhexidine Gluconate Cloth  6 each Topical Daily   levothyroxine  100 mcg Oral Q0600   pantoprazole (PROTONIX) IV  40 mg Intravenous Q12H   sertraline  25 mg Oral Daily   Continuous Infusions:   LOS: 5 days    Time spent: 35 mins    Willeen Niece, MD [facility]   If 7PM-7AM, please contact night-coverage

## 2023-07-26 NOTE — Progress Notes (Signed)
TRH night cross cover note:   I was notified by RN that the patient is complaining of pruritus that has been refractory to existing order for Benadryl cream.  I subsequently ordered prn oral Benadryl for itching.     Peggy Pigg, DO Hospitalist

## 2023-07-26 NOTE — Plan of Care (Signed)
  Problem: Education: Goal: Knowledge of General Education information will improve Description: Including pain rating scale, medication(s)/side effects and non-pharmacologic comfort measures Outcome: Progressing   Problem: Health Behavior/Discharge Planning: Goal: Ability to manage health-related needs will improve Outcome: Progressing   Problem: Clinical Measurements: Goal: Ability to maintain clinical measurements within normal limits will improve Outcome: Progressing Goal: Will remain free from infection Outcome: Progressing Goal: Diagnostic test results will improve Outcome: Progressing Goal: Respiratory complications will improve Outcome: Progressing Goal: Cardiovascular complication will be avoided Outcome: Progressing   Problem: Activity: Goal: Risk for activity intolerance will decrease Outcome: Progressing   Problem: Nutrition: Goal: Adequate nutrition will be maintained Outcome: Progressing   Problem: Coping: Goal: Level of anxiety will decrease Outcome: Progressing   Problem: Elimination: Goal: Will not experience complications related to bowel motility Outcome: Progressing Goal: Will not experience complications related to urinary retention Outcome: Progressing   Problem: Pain Managment: Goal: General experience of comfort will improve and/or be controlled Outcome: Progressing   Problem: Safety: Goal: Ability to remain free from injury will improve Outcome: Progressing   Problem: Skin Integrity: Goal: Risk for impaired skin integrity will decrease Outcome: Progressing   Problem: Activity: Goal: Ability to return to baseline activity level will improve Outcome: Progressing   Problem: Cardiovascular: Goal: Vascular access site(s) Level 0-1 will be maintained Outcome: Progressing   Problem: Health Behavior/Discharge Planning: Goal: Ability to safely manage health-related needs after discharge will improve Outcome: Progressing   Patient is  ambulating at a level that is more consistent with her baseline. Patient has had decreased pain and increased appetite. Patient is able to turn herself in the bed and call for help when needed.

## 2023-07-26 NOTE — Progress Notes (Signed)
Received patient from 107M. Patient is alert and oriented, not in any distress.Patient assisted to bed, oriented to unit and staff. Purewick placed, call light within reach.

## 2023-07-27 LAB — CBC
HCT: 30.3 % — ABNORMAL LOW (ref 36.0–46.0)
Hemoglobin: 9.7 g/dL — ABNORMAL LOW (ref 12.0–15.0)
MCH: 30.4 pg (ref 26.0–34.0)
MCHC: 32 g/dL (ref 30.0–36.0)
MCV: 95 fL (ref 80.0–100.0)
Platelets: 88 10*3/uL — ABNORMAL LOW (ref 150–400)
RBC: 3.19 MIL/uL — ABNORMAL LOW (ref 3.87–5.11)
RDW: 15.1 % (ref 11.5–15.5)
WBC: 5.8 10*3/uL (ref 4.0–10.5)
nRBC: 0 % (ref 0.0–0.2)

## 2023-07-27 NOTE — Plan of Care (Signed)
  Problem: Education: Goal: Knowledge of General Education information will improve Description: Including pain rating scale, medication(s)/side effects and non-pharmacologic comfort measures Outcome: Progressing   Problem: Clinical Measurements: Goal: Ability to maintain clinical measurements within normal limits will improve Outcome: Progressing Goal: Diagnostic test results will improve Outcome: Progressing Goal: Respiratory complications will improve Outcome: Progressing Goal: Cardiovascular complication will be avoided Outcome: Progressing   Problem: Activity: Goal: Risk for activity intolerance will decrease Outcome: Progressing   Problem: Nutrition: Goal: Adequate nutrition will be maintained Outcome: Progressing   Problem: Elimination: Goal: Will not experience complications related to urinary retention Outcome: Progressing   Problem: Pain Managment: Goal: General experience of comfort will improve and/or be controlled Outcome: Progressing   Problem: Safety: Goal: Ability to remain free from injury will improve Outcome: Progressing   Problem: Skin Integrity: Goal: Risk for impaired skin integrity will decrease Outcome: Progressing

## 2023-07-27 NOTE — Discharge Summary (Signed)
Physician Discharge Summary  Peggy Armstrong:[id number] DOB: 02/05/1946 DOA: 07/20/2023  PCP: Dominic Pea, PA-C  Admit date: 07/20/2023  Discharge date: 07/27/2023  Admitted From: Home  Disposition:  Home Health Services.  Recommendations for Outpatient Follow-up:  Follow up with PCP in 1-2 weeks. Please obtain BMP/CBC in one week. Advised to hold Eliquis for at least 2 weeks following EGD. Advised to follow-up with gastroenterology for further recommendation regarding duodenal stenosis.  Home Health: Home PT/OT Equipment/Devices:None  Discharge Condition: Stable CODE STATUS:Full code Diet recommendation: Heart Healthy  Brief [facility] Course: This 78 years old female with PMH significant for HTN, HLD, diastolic HF, hx. DVT, Afib on eliquis and recurrent GIB, who presented to ED on  07/20/23 with bloody diarrhea. 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 Eliquis resumed 4days later.  Patient was hypotensive in the ED and was given 1 unit PRBC and Kcentra.  CT angio shows active bleeding within the second and third portions of duodenum.  GI and IR was consulted.  It was felt too proximal for IR intervention.  Patient was evaluated by GI, was given 1 unit of PRBC.  Patient was admitted for further evaluation.  Patient was scheduled for EGD on 1/24 by Dr. Chales Abrahams.   On 1/23 night patient started having frequent dark bloody bowel movements.  Hemoglobin dropped significantly.  Patient was transfused with 4 units PRBC and was given second dose of Kcentra.  Patient was admitted in the ICU.  She was managed in the ICU. Once patient is hemodynamically stable TRH pickup 07/24/2023.  Patient was continued on supportive care. H&H remained stable,  no further episodes of bleeding.  As per GI Eliquis can be resumed in 2 weeks.  Advised follow-up with outpatient gastroenterology Dr. Adela Lank.  Patient feels much better, PT  and OT recommended home health services.  Patient wants to be discharged home.  Patient discharged home.   Discharge Diagnoses:  Principal Problem:   GI bleed Active Problems:   Hemorrhagic shock (HCC)   ABLA (acute blood loss anemia)   Acute GI bleeding   AKI (acute kidney injury) (HCC)  Hemorrhagic shock , Acute duodenal bleeding: Acute blood loss anemia on chronic anemia: Patient is status post K centra x 2, cumulative PRBC x 8, FFP, Plt 1. Status post EGD 1/24 shows post EMR ulcers, 1 visible vessel status post clipping,  multiple gastric polyps. Patient continued to have bleed, status post IR embolization x 3 of GDA branches 1/25. Appreciate GI and IR assistance. Successfully weaned down to Levophed.  Maintaining goal MAP of above 65.   Patient started on clear liquid diet, advance as tolerated. H&H remains stable,  Keep it above 7 gms.   Continue pantoprazole 40 twice daily As per GI continue to hold anticoagulation,  resume 2 weeks after EGD if needed.   Acute kidney injury likely due to hypoperfusion: Baseline serum creatinine normal,  presented with creatinine 1.40 Avoid nephrotoxic medications, serum creatinine improved.  AKI resolved.   History of A-fib on chronic anticoagulation: Patient remains in normal sinus rhythm, but at times has atrial flutter. Continue amiodarone, Continue to hold Eliquis. Follow-up GI as patient will need for further duodenal resection. As per GI continue to hold anticoagulation,  resume 2 weeks after EGD if needed.   Hyperlipidemia: Continue Lipitor.   Essential hypertension: Continue to hold blood pressure medication as patient's blood pressure is on the soft  side.   History of  HFpEF: Does not appear in acute exacerbation.   Hypothyroidism: Continue levothyroxine.   Depression/anxiety: Continue Zoloft and BuSpar.   Chronic debility / Chronic pain: Patient was recently discharged from rehab.  Uses wheelchair or walker at  home. PT and OT evaluation. Continue oxycodone as needed for pain control.   Hypokalemia/ Hypomagnesemia:  Replaced.  Resolved.   Discharge Instructions  Discharge Instructions     Call MD for:  difficulty breathing, headache or visual disturbances   Complete by: As directed    Call MD for:  persistant dizziness or light-headedness   Complete by: As directed    Call MD for:  persistant nausea and vomiting   Complete by: As directed    Diet - low sodium heart healthy   Complete by: As directed    Diet Carb Modified   Complete by: As directed    Discharge instructions   Complete by: As directed    Advised to follow-up with primary care physician in 1 week. Advised to hold Eliquis for at least 2 weeks following EGD. Advised to follow-up with gastroenterology for further recommendation regarding duodenal stenosis.   Increase activity slowly   Complete by: As directed    No dressing needed   Complete by: As directed       Allergies as of 07/27/2023       Reactions   Codeine Nausea And Vomiting   Made "very sick"   Tape         Medication List     STOP taking these medications    Eliquis 5 MG Tabs tablet Generic drug: apixaban       TAKE these medications    acetaminophen 325 MG tablet Commonly known as: TYLENOL Take 650 mg by mouth every 6 (six) hours as needed for mild pain (pain score 1-3) or moderate pain (pain score 4-6).   allopurinol 300 MG tablet Commonly known as: ZYLOPRIM Take 1 tablet (300 mg total) by mouth daily.   amiodarone 200 MG tablet Commonly known as: PACERONE Take 1 tablet (200 mg total) by mouth daily. Please call [phone] to schedule cardiology follow up for further refills.   atorvastatin 20 MG tablet Commonly known as: LIPITOR Take 1 tablet (20 mg total) by mouth daily.   busPIRone 10 MG tablet Commonly known as: BUSPAR Take 1 tablet (10 mg total) by mouth 2 (two) times daily.   cholecalciferol 1000 units  tablet Commonly known as: VITAMIN D Take 1,000 Units by mouth daily.   cyanocobalamin 1000 MCG tablet Commonly known as: VITAMIN B12 Take 1 tablet by mouth daily.   ferrous sulfate 325 (65 FE) MG tablet Take 325 mg by mouth daily with breakfast.   folic acid 1 MG tablet Commonly known as: FOLVITE Take 1 tablet (1 mg total) by mouth daily.   furosemide 20 MG tablet Commonly known as: LASIX Take 1 tablet (20 mg total) by mouth daily.   Klor-Con M20 20 MEQ tablet Generic drug: potassium chloride SA Take 20 mEq by mouth daily.   levothyroxine 100 MCG tablet Commonly known as: SYNTHROID Take 1 tablet (100 mcg total) by mouth daily before breakfast.   melatonin 3 MG Tabs tablet Take 1 tablet (3 mg total) by mouth at bedtime as needed (insomnia).   ondansetron 4 MG tablet Commonly known as: ZOFRAN Take 4 mg by mouth every 6 (six) hours as needed for nausea or vomiting.   oxyCODONE 5 MG immediate release tablet Commonly known  as: Oxy IR/ROXICODONE Take 5 mg by mouth as needed for severe pain (pain score 7-10).   pantoprazole 40 MG tablet Commonly known as: PROTONIX Take 1 tablet (40 mg total) by mouth daily.   sertraline 25 MG tablet Commonly known as: ZOLOFT Take 1 tablet (25 mg total) by mouth daily.   tiZANidine 4 MG tablet Commonly known as: Zanaflex Take 1 tablet (4 mg total) by mouth every 6 (six) hours as needed for muscle spasms.   Vitamin C CR 500 MG Tbcr Take 500 mg by mouth daily.               Discharge Care Instructions  (From admission, onward)           Start     Ordered   07/27/23 0000  No dressing needed        07/27/23 1050            Follow-up Information     [facility] Follow up.   Specialty: Home Health Services Why: Someone will call you to schedule resumption of care visit. Contact information: [address] [place] [address] [phone]         Rothfuss, Teryl Lucy, PA-C Follow up in 1  week(s).   Specialty: Physician Assistant Contact information: [address] [place] [address] [phone]         Benancio Deeds, MD Follow up in 1 week(s).   Specialty: Gastroenterology Contact information: [address] [place] [address] [phone]                Allergies  Allergen Reactions   Codeine Nausea And Vomiting    Made "very sick"   Tape     Consultations: Gastroenterology PCCM   Procedures/Studies: IR EMBO ART  VEN HEMORR LYMPH EXTRAV  INC GUIDE ROADMAPPING Result Date: 07/22/2023 CLINICAL DATA:  Distal duodenal polyp, status post partial resection, with active arterial bleeding, status post endoscopic clipping, and evidence of continued bleeding. EXAM: IR ULTRASOUND GUIDANCE VASC ACCESS RIGHT; SELECTIVE VISCERAL ARTERIOGRAPHY; IR EMBO ART VEN HEMORR LYMPH EXTRAV INC GUIDE ROADMAPPING ANESTHESIA/SEDATION: Intravenous Fentanyl and Versed 2mg  were administered by RN during a total moderate (conscious) sedation time of 65 minutes; the patient's level of consciousness and physiological / cardiorespiratory status were monitored continuously by radiology RN under my direct supervision. MEDICATIONS: Lidocaine 1% subcutaneous CONTRAST:  OMNIPAQUE IOHEXOL 300 MG/ML SOLN, <See Chart> OMNIPAQUE IOHEXOL 300 MG/ML SOLN PROCEDURE: The procedure, risks (including but not limited to bleeding, infection, organ damage ), benefits, and alternatives were explained to the patient. Questions regarding the procedure were encouraged and answered. The patient understands and consents to the procedure. Right femoral region prepped and draped in usual sterile fashion. Maximal barrier sterile technique was utilized including caps, mask, sterile gowns, sterile gloves, sterile drape, hand hygiene and skin antiseptic. The right common femoral artery was localized under ultrasound. Under real-time ultrasound guidance, the vessel was accessed with a 21-gauge  micropuncture needle, exchanged over a 018 guidewire for a transitional dilator, through which a 035 guidewire was advanced. Over this, a 5 [place] vascular sheath was placed, through which a 5 [place] C2 catheter was advanced and used to selectively catheterize the splenic artery for selective arteriography. The C2 was then utilized to selectively catheterize the superior mesenteric artery for selective arteriography. Catheter was advanced into the common hepatic artery for additional selective arteriography in multiple projections. Endoscopic clips were localized and feeding vessels  were identified. A coaxial Renegade catheter was then advanced with fathom guidewire and used to selectively catheterize a pancreaticoduodenal artery supplying the lesion. Sub selective arteriography was obtained in multiple projections. The vessel was then embolized distally with 2 coils. In similar fashion, the gastroduodenal artery was selectively catheterized with a microcatheter in multiple projections, demonstrating perfusion to the region of concern as well as large right gastroepiploic artery. The vessel was then embolized distal with microcoils to allow continued collateral perfusion to the gastroepiploic artery. A separate superior pancreaticoduodenal artery was identified from the distal common hepatic artery, and this too was selectively catheterized after confirmatory arteriography in multiple projections. Selective microcoil embolization performed. Microcatheter was removed. Follow-up common hepatic arteriogram shows excellent diminution of flow to the distal duodenal region marked by endoscopic clip, with continued patency of proper hepatic artery distally. The catheter and sheath were removed and hemostasis achieved with manual compression. The patient tolerated the procedure well. COMPLICATIONS: None immediate FINDINGS:: FINDINGS: No active arterial extravasation is identified. Variant gastroduodenal artery anatomy is  identified with 3 separate contributing branches from the common hepatic artery. Using the endoscopic clips as confirmatory guidance, supplying branches of the gastroduodenal artery from were successfully embolized with microcoils. No immediate complication. IMPRESSION: Successful selective mesenteric arteriography and selective embolization of gastroduodenal artery branches supplying the distal duodenal polyp. Electronically Signed   By: Corlis Leak M.D.   On: 07/22/2023 19:09   IR US Guide Vasc Access Right Result Date: 07/22/2023 CLINICAL DATA:  Distal duodenal polyp, status post partial resection, with active arterial bleeding, status post endoscopic clipping, and evidence of continued bleeding. EXAM: IR ULTRASOUND GUIDANCE VASC ACCESS RIGHT; SELECTIVE VISCERAL ARTERIOGRAPHY; IR EMBO ART VEN HEMORR LYMPH EXTRAV INC GUIDE ROADMAPPING ANESTHESIA/SEDATION: Intravenous Fentanyl and Versed 2mg  were administered by RN during a total moderate (conscious) sedation time of 65 minutes; the patient's level of consciousness and physiological / cardiorespiratory status were monitored continuously by radiology RN under my direct supervision. MEDICATIONS: Lidocaine 1% subcutaneous CONTRAST:  OMNIPAQUE IOHEXOL 300 MG/ML SOLN, <See Chart> OMNIPAQUE IOHEXOL 300 MG/ML SOLN PROCEDURE: The procedure, risks (including but not limited to bleeding, infection, organ damage ), benefits, and alternatives were explained to the patient. Questions regarding the procedure were encouraged and answered. The patient understands and consents to the procedure. Right femoral region prepped and draped in usual sterile fashion. Maximal barrier sterile technique was utilized including caps, mask, sterile gowns, sterile gloves, sterile drape, hand hygiene and skin antiseptic. The right common femoral artery was localized under ultrasound. Under real-time ultrasound guidance, the vessel was accessed with a 21-gauge micropuncture needle,  exchanged over a 018 guidewire for a transitional dilator, through which a 035 guidewire was advanced. Over this, a 5 [place] vascular sheath was placed, through which a 5 [place] C2 catheter was advanced and used to selectively catheterize the splenic artery for selective arteriography. The C2 was then utilized to selectively catheterize the superior mesenteric artery for selective arteriography. Catheter was advanced into the common hepatic artery for additional selective arteriography in multiple projections. Endoscopic clips were localized and feeding vessels were identified. A coaxial Renegade catheter was then advanced with fathom guidewire and used to selectively catheterize a pancreaticoduodenal artery supplying the lesion. Sub selective arteriography was obtained in multiple projections. The vessel was then embolized distally with 2 coils. In similar fashion, the gastroduodenal artery was selectively catheterized with a microcatheter in multiple projections, demonstrating perfusion to the region of concern as well as large right gastroepiploic artery. The  vessel was then embolized distal with microcoils to allow continued collateral perfusion to the gastroepiploic artery. A separate superior pancreaticoduodenal artery was identified from the distal common hepatic artery, and this too was selectively catheterized after confirmatory arteriography in multiple projections. Selective microcoil embolization performed. Microcatheter was removed. Follow-up common hepatic arteriogram shows excellent diminution of flow to the distal duodenal region marked by endoscopic clip, with continued patency of proper hepatic artery distally. The catheter and sheath were removed and hemostasis achieved with manual compression. The patient tolerated the procedure well. COMPLICATIONS: None immediate FINDINGS:: FINDINGS: No active arterial extravasation is identified. Variant gastroduodenal artery anatomy is identified with 3  separate contributing branches from the common hepatic artery. Using the endoscopic clips as confirmatory guidance, supplying branches of the gastroduodenal artery from were successfully embolized with microcoils. No immediate complication. IMPRESSION: Successful selective mesenteric arteriography and selective embolization of gastroduodenal artery branches supplying the distal duodenal polyp. Electronically Signed   By: Corlis Leak M.D.   On: 07/22/2023 19:09   IR Angiogram Visceral Selective Result Date: 07/22/2023 CLINICAL DATA:  Distal duodenal polyp, status post partial resection, with active arterial bleeding, status post endoscopic clipping, and evidence of continued bleeding. EXAM: IR ULTRASOUND GUIDANCE VASC ACCESS RIGHT; SELECTIVE VISCERAL ARTERIOGRAPHY; IR EMBO ART VEN HEMORR LYMPH EXTRAV INC GUIDE ROADMAPPING ANESTHESIA/SEDATION: Intravenous Fentanyl and Versed 2mg  were administered by RN during a total moderate (conscious) sedation time of 65 minutes; the patient's level of consciousness and physiological / cardiorespiratory status were monitored continuously by radiology RN under my direct supervision. MEDICATIONS: Lidocaine 1% subcutaneous CONTRAST:  OMNIPAQUE IOHEXOL 300 MG/ML SOLN, <See Chart> OMNIPAQUE IOHEXOL 300 MG/ML SOLN PROCEDURE: The procedure, risks (including but not limited to bleeding, infection, organ damage ), benefits, and alternatives were explained to the patient. Questions regarding the procedure were encouraged and answered. The patient understands and consents to the procedure. Right femoral region prepped and draped in usual sterile fashion. Maximal barrier sterile technique was utilized including caps, mask, sterile gowns, sterile gloves, sterile drape, hand hygiene and skin antiseptic. The right common femoral artery was localized under ultrasound. Under real-time ultrasound guidance, the vessel was accessed with a 21-gauge micropuncture needle, exchanged over a  018 guidewire for a transitional dilator, through which a 035 guidewire was advanced. Over this, a 5 [place] vascular sheath was placed, through which a 5 [place] C2 catheter was advanced and used to selectively catheterize the splenic artery for selective arteriography. The C2 was then utilized to selectively catheterize the superior mesenteric artery for selective arteriography. Catheter was advanced into the common hepatic artery for additional selective arteriography in multiple projections. Endoscopic clips were localized and feeding vessels were identified. A coaxial Renegade catheter was then advanced with fathom guidewire and used to selectively catheterize a pancreaticoduodenal artery supplying the lesion. Sub selective arteriography was obtained in multiple projections. The vessel was then embolized distally with 2 coils. In similar fashion, the gastroduodenal artery was selectively catheterized with a microcatheter in multiple projections, demonstrating perfusion to the region of concern as well as large right gastroepiploic artery. The vessel was then embolized distal with microcoils to allow continued collateral perfusion to the gastroepiploic artery. A separate superior pancreaticoduodenal artery was identified from the distal common hepatic artery, and this too was selectively catheterized after confirmatory arteriography in multiple projections. Selective microcoil embolization performed. Microcatheter was removed. Follow-up common hepatic arteriogram shows excellent diminution of flow to the distal duodenal region marked by endoscopic clip, with continued patency of proper hepatic  artery distally. The catheter and sheath were removed and hemostasis achieved with manual compression. The patient tolerated the procedure well. COMPLICATIONS: None immediate FINDINGS:: FINDINGS: No active arterial extravasation is identified. Variant gastroduodenal artery anatomy is identified with 3 separate contributing  branches from the common hepatic artery. Using the endoscopic clips as confirmatory guidance, supplying branches of the gastroduodenal artery from were successfully embolized with microcoils. No immediate complication. IMPRESSION: Successful selective mesenteric arteriography and selective embolization of gastroduodenal artery branches supplying the distal duodenal polyp. Electronically Signed   By: Corlis Leak M.D.   On: 07/22/2023 19:09   CT Angio Abd/Pel W and/or Wo Contrast Result Date: 07/20/2023 CLINICAL DATA:  Dark red blood in stool since this morning, dizziness, anticoagulated EXAM: CTA ABDOMEN AND PELVIS WITHOUT AND WITH CONTRAST TECHNIQUE: Multidetector CT imaging of the abdomen and pelvis was performed using the standard protocol during bolus administration of intravenous contrast. Multiplanar reconstructed images and MIPs were obtained and reviewed to evaluate the vascular anatomy. RADIATION DOSE REDUCTION: This exam was performed according to the departmental dose-optimization program which includes automated exposure control, adjustment of the mA and/or kV according to patient size and/or use of iterative reconstruction technique. CONTRAST:  75mL OMNIPAQUE IOHEXOL 350 MG/ML SOLN COMPARISON:  05/15/2023 FINDINGS: VASCULAR Aorta: Normal caliber aorta without aneurysm, dissection, vasculitis or significant stenosis. Diffuse atherosclerosis. Celiac: Patent without evidence of aneurysm, dissection, vasculitis or significant stenosis. SMA: Patent without evidence of aneurysm, dissection, vasculitis or significant stenosis. 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 Renals: Both renal arteries are patent without evidence of aneurysm, dissection, vasculitis, fibromuscular dysplasia or significant stenosis. Mild atherosclerosis without critical stenosis. IMA: Patent without evidence of aneurysm, dissection, vasculitis or significant stenosis. Inflow: There is diffuse  atherosclerosis throughout the iliac vessels. Focal high-grade stenosis at the origin of the left iliac artery estimated greater than 70%. Remaining vessels are patent. No aneurysm, dissection, or vasculitis. Proximal Outflow: Bilateral common femoral and visualized portions of the superficial and profunda femoral arteries are patent without evidence of aneurysm, dissection, vasculitis or significant stenosis. Diffuse atherosclerosis. Veins: No obvious venous abnormality within the limitations of this arterial phase study. Review of the MIP images confirms the above findings. NON-VASCULAR Lower chest: Trace right pleural fluid.  Moderate hiatal hernia. Hepatobiliary: No focal liver abnormality is seen. Status post cholecystectomy. No biliary dilatation. Pancreas: Unremarkable. No pancreatic ductal dilatation or surrounding inflammatory changes. Spleen: Normal in size without focal abnormality. Adrenals/Urinary Tract: Left renal cortical cyst do not require specific imaging follow-up. There is bilateral renal cortical thinning. No urinary tract calculi or obstructive uropathy. Adrenals and bladder are unremarkable. Stomach/Bowel: No bowel obstruction or ileus. Moderate hiatal hernia. There is abnormal mural thickening involving the second and third portions of the duodenum, with mild adjacent fat stranding. Active gastrointestinal bleeding is identified within this segment of the duodenum. Differential diagnosis would include duodenitis, peptic ulcer disease, or neoplasm. Endoscopy is recommended for further evaluation. Lymphatic: There are several subcentimeter lymph nodes adjacent to the duodenal wall thickening, largest measuring 7 mm reference image 46/5. No pathologic adenopathy within the abdomen or pelvis. Reproductive: Status post hysterectomy. No adnexal masses. Other: Trace pelvic free fluid. No free intraperitoneal gas. No abdominal wall hernia. Musculoskeletal: No acute or destructive bony abnormalities.  Chronic L2 compression deformity with resulting vertebra plana unchanged since prior exam. Healing fracture through the superior margin of the S2 vertebral body, with increased callus formation since prior exam. Reconstructed images demonstrate no additional findings. IMPRESSION: VASCULAR  1. Active gastrointestinal bleeding within the second and third portions of the duodenum, associated with segmental duodenal wall thickening and fat stranding. 2. Aortic Atherosclerosis (ICD10-I70.0). Atherosclerosis throughout the branch vessels as above, with focal high-grade stenosis at the origin of the left internal iliac artery. NON-VASCULAR 1. Wall thickening of the second and third portion duodenum, with mild surrounding fat stranding and subcentimeter lymph nodes. Differential diagnosis includes duodenitis, peptic ulcer disease, or neoplasm. Endoscopy is recommended for further evaluation. 2. Moderate hiatal hernia. 3. Trace pelvic free fluid. Critical Value/emergent results were called by telephone at the time of interpretation on 07/20/2023 at 4:02 pm to provider Marita Kansas, PA, who verbally acknowledged these results. Electronically Signed   By: Sharlet Salina M.D.   On: 07/20/2023 16:05     Subjective: Patient was seen and examined at bedside.  Overnight events noted.   Patient reports doing much better,  wants to be discharged.  Home health services arranged.  Discharge Exam: Vitals:   07/27/23 0338 07/27/23 0758  BP: (!) 140/73 132/70  Pulse: 66 67  Resp: 18 18  Temp: 99 F (37.2 C) 98.2 F (36.8 C)  SpO2: 95% 98%   Vitals:   07/26/23 2335 07/27/23 0338 07/27/23 0500 07/27/23 0758  BP: (!) 131/59 (!) 140/73  132/70  Pulse: 66 66  67  Resp:  18  18  Temp: 97.9 F (36.6 C) 99 F (37.2 C)  98.2 F (36.8 C)  TempSrc: Oral Oral  Oral  SpO2: 97% 95%  98%  Weight:   93.5 kg   Height:        General: Pt is alert, awake, not in acute distress Cardiovascular: RRR, S1/S2 +, no rubs, no  gallops Respiratory: CTA bilaterally, no wheezing, no rhonchi Abdominal: Soft, NT, ND, bowel sounds + Extremities: no edema, no cyanosis    The results of significant diagnostics from this hospitalization (including imaging, microbiology, ancillary and laboratory) are listed below for reference.     Microbiology: Recent Results (from the past 240 hours)  MRSA Next Gen by PCR, Nasal     Status: None   Collection Time: 07/21/23  3:27 AM   Specimen: Nasal Mucosa; Nasal Swab  Result Value Ref Range Status   MRSA by PCR Next Gen NOT DETECTED NOT DETECTED Final    Comment: (NOTE) The GeneXpert MRSA Assay (FDA approved for NASAL specimens only), is one component of a comprehensive MRSA colonization surveillance program. It is not intended to diagnose MRSA infection nor to guide or monitor treatment for MRSA infections. Test performance is not FDA approved in patients less than 14 years old. Performed at [facility], [address] [place], [place] [address]      Labs: BNP (last 3 results) No results for input(s): "BNP" in the last 8760 hours. Basic Metabolic Panel: Recent Labs  Lab 07/21/23 0755 07/21/23 0830 07/22/23 0022 07/22/23 1553 07/23/23 0913 07/24/23 0312 07/25/23 0544  NA  --    < > 134* 138 136 138 135  K  --    < > 4.7 3.7 3.8 2.9* 3.5  CL  --    < > 104 106 106 106 104  CO2  --    < > 23 24 24 24 25   GLUCOSE  --    < > 120* 107* 87 95 87  BUN  --    < > 42* 35* 26* 19 12  CREATININE  --    < > 1.37* 1.17* 1.12* 1.06*  0.82  CALCIUM  --    < > 7.4* 7.9* 7.8* 7.7* 7.6*  MG 1.2*  --  2.2  --  2.0 1.6* 2.0  PHOS  --   --  4.7*  --   --   --  2.8   < > = values in this interval not displayed.   Liver Function Tests: Recent Labs  Lab 07/21/23 0830 07/22/23 0022  AST 29 24  ALT 22 19  ALKPHOS 65 58  BILITOT 1.3* 0.8  PROT 3.8* 3.4*  ALBUMIN 2.0* 1.8*   No results for input(s): "LIPASE", "AMYLASE" in the last 168 hours. No results for input(s):  "AMMONIA" in the last 168 hours. CBC: Recent Labs  Lab 07/22/23 0826 07/22/23 0827 07/23/23 0913 07/23/23 1607 07/24/23 0312 07/25/23 0544 07/26/23 0245 07/27/23 0532  WBC 11.4*  --  5.7 5.8 6.0 4.9 4.8 5.8  NEUTROABS 8.4*  --  4.5  --   --   --   --   --   HGB 10.0*   < > 8.7* 9.0* 8.4* 8.7* 8.4* 9.7*  HCT 30.6*   < > 26.1* 27.4* 26.0* 27.2* 26.0* 30.3*  MCV 91.9  --  92.2 93.8 93.5 95.4 95.6 95.0  PLT 161   < > 71* PLATELET CLUMPS NOTED ON SMEAR, UNABLE TO ESTIMATE 66* 73* 76* 88*   < > = values in this interval not displayed.   Cardiac Enzymes: No results for input(s): "CKTOTAL", "CKMB", "CKMBINDEX", "TROPONINI" in the last 168 hours. BNP: Invalid input(s): "POCBNP" CBG: Recent Labs  Lab 07/26/23 0003 07/26/23 0412 07/26/23 0759 07/26/23 1146 07/26/23 1538  GLUCAP 96 84 93 84 123*   D-Dimer No results for input(s): "DDIMER" in the last 72 hours. Hgb A1c No results for input(s): "HGBA1C" in the last 72 hours. Lipid Profile No results for input(s): "CHOL", "HDL", "LDLCALC", "TRIG", "CHOLHDL", "LDLDIRECT" in the last 72 hours. Thyroid function studies No results for input(s): "TSH", "T4TOTAL", "T3FREE", "THYROIDAB" in the last 72 hours.  Invalid input(s): "FREET3" Anemia work up No results for input(s): "VITAMINB12", "FOLATE", "FERRITIN", "TIBC", "IRON", "RETICCTPCT" in the last 72 hours. Urinalysis    Component Value Date/Time   COLORURINE YELLOW 05/11/2023 1721   APPEARANCEUR CLOUDY (A) 05/11/2023 1721   LABSPEC 1.012 05/11/2023 1721   PHURINE 7.0 05/11/2023 1721   GLUCOSEU NEGATIVE 05/11/2023 1721   HGBUR LARGE (A) 05/11/2023 1721   BILIRUBINUR NEGATIVE 05/11/2023 1721   KETONESUR NEGATIVE 05/11/2023 1721   PROTEINUR NEGATIVE 05/11/2023 1721   NITRITE POSITIVE (A) 05/11/2023 1721   LEUKOCYTESUR MODERATE (A) 05/11/2023 1721   Sepsis Labs Recent Labs  Lab 07/24/23 0312 07/25/23 0544 07/26/23 0245 07/27/23 0532  WBC 6.0 4.9 4.8 5.8    Microbiology Recent Results (from the past 240 hours)  MRSA Next Gen by PCR, Nasal     Status: None   Collection Time: 07/21/23  3:27 AM   Specimen: Nasal Mucosa; Nasal Swab  Result Value Ref Range Status   MRSA by PCR Next Gen NOT DETECTED NOT DETECTED Final    Comment: (NOTE) The GeneXpert MRSA Assay (FDA approved for NASAL specimens only), is one component of a comprehensive MRSA colonization surveillance program. It is not intended to diagnose MRSA infection nor to guide or monitor treatment for MRSA infections. Test performance is not FDA approved in patients less than 77 years old. Performed at [facility], [address], [place], [place] [address]      Time coordinating discharge: Over 30  minutes  SIGNED:   Willeen Niece, MD  [facility] 07/27/2023, 2:44 PM Pager   If 7PM-7AM, please contact night-coverage

## 2023-07-27 NOTE — Progress Notes (Signed)
Mobility Specialist Progress Note:   07/27/23 1100  Mobility  Activity Ambulated with assistance in hallway  Level of Assistance Minimal assist, patient does 75% or more  Assistive Device Front wheel walker  Distance Ambulated (ft) 120 ft  Activity Response Tolerated well  Mobility Referral Yes  Mobility visit 1 Mobility  Mobility Specialist Start Time (ACUTE ONLY) 1000  Mobility Specialist Stop Time (ACUTE ONLY) 1020  Mobility Specialist Time Calculation (min) (ACUTE ONLY) 20 min   Pt received in bed, agreeable to mobility. MinA to transition to EOB. MinG during ambulation. Donned spinal brace independently. Pt taking frequent rest breaks during ambulation d/t fatigue and leg stiffness, otherwise asx throughout. Pt left in chair with call bell in reach and all needs met.   Leory Plowman  Mobility Specialist Please contact via [employer] office at [phone]

## 2023-07-27 NOTE — Progress Notes (Signed)
Discharge order received.  Saline lock removed.  AVS Reviewed with pt Pt verbalized understanding.  Personal belongings with pt at time of discharge including phone, charger, purse and personal belongings bag packed by pt.  PTAR to transport pt home.

## 2023-07-27 NOTE — Plan of Care (Signed)

## 2023-07-27 NOTE — Discharge Instructions (Signed)
Advised to hold Eliquis for at least 2 weeks following EGD. Advised to follow-up with gastroenterology for further recommendation regarding duodenal stenosis.

## 2023-07-27 NOTE — TOC Progression Note (Addendum)
Transition of Care [facility]) - Progression Note    Patient Details  Name: Peggy Armstrong MRN: [id number] Date of Birth: 1946-05-07  [facility] CM/SW Contact  Nadene Rubins Adria Devon, RN Phone Number: 07/27/2023, 12:27 PM  Clinical Narrative:      Patient for discharge today. Active with [facility] for home health services. Kelly with [facility] aware discharge is today.   Patient requiring PTAR transport home. NCM confirmed face sheet address with patient. Patient states her daughter and grand daughter are at home waiting on her arrival.   NCM secure chatted nurse to see if nurse is ready for NCM to call PTAR. PTAR paperwork in chart . Nurse ready. PTAR called, told will not be long. Patient daughter [name] and nurse aware       Expected Discharge Plan and Services         Expected Discharge Date: 07/27/23                                     Social Determinants of Health (SDOH) Interventions SDOH Screenings   Food Insecurity: No Food Insecurity (07/20/2023)  Housing: Low Risk  (07/20/2023)  Transportation Needs: No Transportation Needs (07/20/2023)  Utilities: Not At Risk (07/20/2023)  Depression (PHQ2-9): Low Risk  (07/18/2023)  Financial Resource Strain: Low Risk  (12/02/2022)   Received from [facility], [facility]  Physical Activity: Unknown (04/05/2022)   Received from [facility], [facility]  Social Connections: Socially Isolated (07/20/2023)  Stress: No Stress Concern Present (04/05/2022)   Received from [facility], [facility]  Tobacco Use: Medium Risk (07/22/2023)    Readmission Risk Interventions    07/24/2023    5:23 PM 05/14/2023    6:44 PM  Readmission Risk Prevention Plan  Transportation Screening Complete Complete  PCP or Specialist Appt within 3-5 Days Complete Complete  HRI or Home Care Consult Complete Complete  Social Work Consult for Recovery Care Planning/Counseling Complete Complete  Palliative Care Screening Not  Applicable Complete  Medication Review Oceanographer) Referral to Pharmacy Referral to Pharmacy

## 2023-07-27 NOTE — Progress Notes (Signed)
Pt has been having c/o pruritis of the back for several days. Pt reports that she will also get an intermittent rash that "comes and goes". Barrier cream applied to back at 2000 per pt request. At that time, lower back had a small area of blanchable erythema. At approx 0300 pt called out w/ c/o increasing pruritus of lower and upper back. On assessment, pt observed to have generalized blanchable rash on upper/lower back, primarily R-sided. A small number of vesicles noted on lower back as well. PRN PO benadryl administered as well as benadryl ointment. Picture taken of back and placed in chart after obtaining pt consent. Liana Crocker, NP notified. Orders given to continue monitoring spread/growth of vesicles.

## 2023-07-27 NOTE — Progress Notes (Signed)
Physical Therapy Treatment Patient Details Name: Peggy Armstrong MRN: [id number] DOB: 28-Nov-1945 Today's Date: 07/27/2023   History of Present Illness Patient presenting to the ED with recurrent bloody stools on 07/20/23. Admitted with a GI bleed, and has required 4 units PRBCs. EGD on 1/24. GDA branch embolization 1/25. PMH includes: HTN, HLD, chronic diastolic heart failure, RA, prior history of DVT, chronic afib, duodenal adenoma, L2 compression fracture with recent admissions for recurrent GI bleeding requiring blood transfusion in 12/24.    PT Comments  Pt received in supine, agreeable to therapy session with emphasis on log rolling/back precaution compliance for comfort and transfer training. Pt defers gait/stair training as she has not had lunch yet and planning to DC soon. Pt needing up to light modA for sit<>stand from lower bed surface height and return to supine via log roll. Pt continues to benefit from PT services to progress toward functional mobility goals.     If plan is discharge home, recommend the following: A lot of help with bathing/dressing/bathroom;Assistance with cooking/housework;Help with stairs or ramp for entrance;A little help with walking and/or transfers   Can travel by private vehicle        Equipment Recommendations  None recommended by PT (ambulance transport home, 15 STE)    Recommendations for Other Services       Precautions / Restrictions Precautions Precautions: Back Precaution Booklet Issued: Yes (comment) Precaution Comments: Back prec for comfort, with L2 compression fx Required Braces or Orthoses: Spinal Brace Spinal Brace: Applied in sitting position Restrictions Weight Bearing Restrictions Per Provider Order: No Other Position/Activity Restrictions: Uses the brace when up and walking; pt can don brace independently     Mobility  Bed Mobility Overal bed mobility: Needs Assistance Bed Mobility: Rolling, Sidelying to Sit, Sit to  Sidelying Rolling: Contact guard assist, Used rails Sidelying to sit: Min assist, Used rails     Sit to sidelying: Used rails, Mod assist General bed mobility comments: Cues for use of rails (pt has hospital bed at home) and sequencing for log roll; ModA to get BLE over EOB returning to supine during log roll transfer    Transfers Overall transfer level: Needs assistance Equipment used: Rolling walker (2 wheels) Transfers: Sit to/from Stand Sit to Stand: Min assist, Mod assist           General transfer comment: from EOB min to modA, mild posterior imbalance upon standing and sidesteps toward [facility] with RW support    Ambulation/Gait               General Gait Details: pt defers due to planning to DC soon via PTAR and wanting to rest and eat a snack   Stairs             Wheelchair Mobility     Tilt Bed    Modified Rankin (Stroke Patients Only)       Balance Overall balance assessment: Needs assistance Sitting-balance support: No upper extremity supported, Feet supported Sitting balance-Leahy Scale: Good     Standing balance support: Single extremity supported, Reliant on assistive device for balance Standing balance-Leahy Scale: Poor Standing balance comment: posterior imbalance                            Cognition Arousal: Alert Behavior During Therapy: WFL for tasks assessed/performed Overall Cognitive Status: Within Functional Limits for tasks assessed  General Comments: recalls 3/3 back precs        Exercises Other Exercises Other Exercises: reviewed supine BLE AROM: ankle pumps, heel slides, pt performs partial bridges x2 during bed mobility, cues for monitoring for pain and activity modification PRN for safety given lumbar fx Other Exercises: cues for pursed-lip breathing    General Comments General comments (skin integrity, edema, etc.): pt has 15 STE home, does not want to  practice today but will have HHPT follow up to work on this, she has ambulance transport home      Pertinent Vitals/Pain Pain Assessment Pain Assessment: No/denies pain Pain Intervention(s): Monitored during session, Repositioned     PT Goals (current goals can now be found in the care plan section) Acute Rehab PT Goals Patient Stated Goal: wants to be able to get straight home PT Goal Formulation: With patient Time For Goal Achievement: 08/06/23 Progress towards PT goals: Progressing toward goals    Frequency    Min 1X/week      PT Plan         AM-PAC PT "6 Clicks" Mobility   Outcome Measure  Help needed turning from your back to your side while in a flat bed without using bedrails?: A Little Help needed moving from lying on your back to sitting on the side of a flat bed without using bedrails?: A Lot Help needed moving to and from a bed to a chair (including a wheelchair)?: A Little Help needed standing up from a chair using your arms (e.g., wheelchair or bedside chair)?: A Lot Help needed to walk in hospital room?: A Little Help needed climbing 3-5 steps with a railing? : Total 6 Click Score: 14    End of Session Equipment Utilized During Treatment: Gait belt;Back brace Activity Tolerance: Patient tolerated treatment well Patient left: with call bell/phone within reach;in bed;with bed alarm set (awaiting PTAR and snack) Nurse Communication: Mobility status;Other (comment) (pt did not eat lunch, wants a snack) PT Visit Diagnosis: Unsteadiness on feet (R26.81);Other abnormalities of gait and mobility (R26.89);Muscle weakness (generalized) (M62.81)     Time: 1610-9604 PT Time Calculation (min) (ACUTE ONLY): 17 min  Charges:    $Therapeutic Activity: 8-22 mins PT General Charges $$ ACUTE PT VISIT: 1 Visit                     Alicja Everitt P., PTA Acute Rehabilitation Services Secure Chat Preferred 9a-5:30pm Office: [phone]    Dorathy Kinsman [facility] 07/27/2023, 2:59  PM

## 2023-07-28 ENCOUNTER — Ambulatory Visit (HOSPITAL_BASED_OUTPATIENT_CLINIC_OR_DEPARTMENT_OTHER): Payer: Self-pay | Admitting: Student

## 2023-07-28 ENCOUNTER — Telehealth: Payer: 59 | Admitting: Family Medicine

## 2023-07-28 NOTE — Telephone Encounter (Signed)
  Chief Complaint: Rash Symptoms: Itchiness, redness Frequency: Constant Pertinent Negatives: Patient denies Fever, SOB, angioedema, pain, burning Disposition: [] ED /[] Urgent Care (no appt availability in office) / [] Appointment(In office/virtual)/ [x]  [facility]/ [] Home Care/ [] Refused Recommended Disposition /[] [facility]/ []  Follow-up with PCP Additional Notes: Patient's daughter called with complaints of patient having a rash stemming from suspect allergic reaction to detergents after leaving ER on 07/20/23. Patient's daughter states that rash onset was about a day after the ER visit, and is currently on her back, left shoulder and bottom. Rash is aggravated with palpation/rubbing. Rash is red with welts, back rash is slightly larger than palm size and left shoulder spot is roughly golf ball size. Itch  is seen with some relief with prednisone, benadryl, and non steroidal anti itch cream, however rash not receding. Patient denies SOB, angioedema, drainage, pain, or burning with rash. Patient's daughter advised by this RN to be seen within 3 days, to which patient's daughter was agreeable. Patient's daughter advised by this RN to call back with worsening symptoms. Patient verbalized understanding. Patient transferred to agent to assist with MyChart registration for VV.    Copied from CRM [id number]. Topic: Clinical - Red Word Triage >> Jul 28, 2023 10:43 AM Elle L wrote: Red Word that prompted transfer to Nurse Triage: The patient's daughter, Salsabeel Gorelick, called on behalf of the patient as she was just released from the hospital but is having an allergic reaction and benadryl is not helping. They believe it's from the sheets at the hospital. Reason for Disposition  Localized rash present > 7 days  Answer Assessment - Initial Assessment Questions 1. APPEARANCE of RASH: "Describe the rash."      Red, welts (back) 2. LOCATION: "Where is the rash located?"      Back, bottom,  left shoulder blade 3. NUMBER: "How many spots are there?"      3 4. SIZE: "How big are the spots?" (Inches, centimeters or compare to size of a coin)      Bottom of back - palm size. Shoulder,   5. ONSET: "When did the rash start?"      About 5 days ago. 6. ITCHING: "Does the rash itch?" If Yes, ask: "How bad is the itch?"  (Scale 0-10; or none, mild, moderate, severe)     Moderate 7. PAIN: "Does the rash hurt?" If Yes, ask: "How bad is the pain?"  (Scale 0-10; or none, mild, moderate, severe)    - NONE (0): no pain    - MILD (1-3): doesn't interfere with normal activities     - MODERATE (4-7): interferes with normal activities or awakens from sleep     - SEVERE (8-10): excruciating pain, unable to do any normal activities   8. OTHER SYMPTOMS: "Do you have any other symptoms?" (e.g., fever)     Benadryl - not helping. Anti-itch cream - not helping.  Protocols used: Rash or Redness - Localized-A-AH

## 2023-07-28 NOTE — Progress Notes (Signed)
 Pt did not show for visit. DWB

## 2023-07-29 DIAGNOSIS — M51369 Other intervertebral disc degeneration, lumbar region without mention of lumbar back pain or lower extremity pain: Secondary | ICD-10-CM | POA: Diagnosis not present

## 2023-07-29 DIAGNOSIS — E871 Hypo-osmolality and hyponatremia: Secondary | ICD-10-CM | POA: Diagnosis not present

## 2023-07-29 DIAGNOSIS — S3214XD Type 1 fracture of sacrum, subsequent encounter for fracture with routine healing: Secondary | ICD-10-CM | POA: Diagnosis not present

## 2023-07-29 DIAGNOSIS — I5032 Chronic diastolic (congestive) heart failure: Secondary | ICD-10-CM | POA: Diagnosis not present

## 2023-07-29 DIAGNOSIS — I482 Chronic atrial fibrillation, unspecified: Secondary | ICD-10-CM | POA: Diagnosis not present

## 2023-07-29 DIAGNOSIS — I2510 Atherosclerotic heart disease of native coronary artery without angina pectoris: Secondary | ICD-10-CM | POA: Diagnosis not present

## 2023-07-29 DIAGNOSIS — Z87891 Personal history of nicotine dependence: Secondary | ICD-10-CM | POA: Diagnosis not present

## 2023-07-29 DIAGNOSIS — E039 Hypothyroidism, unspecified: Secondary | ICD-10-CM | POA: Diagnosis not present

## 2023-07-29 DIAGNOSIS — M109 Gout, unspecified: Secondary | ICD-10-CM | POA: Diagnosis not present

## 2023-07-29 DIAGNOSIS — I4892 Unspecified atrial flutter: Secondary | ICD-10-CM | POA: Diagnosis not present

## 2023-07-29 DIAGNOSIS — D509 Iron deficiency anemia, unspecified: Secondary | ICD-10-CM | POA: Diagnosis not present

## 2023-07-29 DIAGNOSIS — S2241XD Multiple fractures of ribs, right side, subsequent encounter for fracture with routine healing: Secondary | ICD-10-CM | POA: Diagnosis not present

## 2023-07-29 DIAGNOSIS — N1832 Chronic kidney disease, stage 3b: Secondary | ICD-10-CM | POA: Diagnosis not present

## 2023-07-29 DIAGNOSIS — S32021D Stable burst fracture of second lumbar vertebra, subsequent encounter for fracture with routine healing: Secondary | ICD-10-CM | POA: Diagnosis not present

## 2023-07-29 DIAGNOSIS — Z86718 Personal history of other venous thrombosis and embolism: Secondary | ICD-10-CM | POA: Diagnosis not present

## 2023-07-29 DIAGNOSIS — K219 Gastro-esophageal reflux disease without esophagitis: Secondary | ICD-10-CM | POA: Diagnosis not present

## 2023-07-29 DIAGNOSIS — Z9181 History of falling: Secondary | ICD-10-CM | POA: Diagnosis not present

## 2023-07-29 DIAGNOSIS — I130 Hypertensive heart and chronic kidney disease with heart failure and stage 1 through stage 4 chronic kidney disease, or unspecified chronic kidney disease: Secondary | ICD-10-CM | POA: Diagnosis not present

## 2023-07-29 DIAGNOSIS — E782 Mixed hyperlipidemia: Secondary | ICD-10-CM | POA: Diagnosis not present

## 2023-07-29 DIAGNOSIS — E785 Hyperlipidemia, unspecified: Secondary | ICD-10-CM | POA: Diagnosis not present

## 2023-07-31 ENCOUNTER — Encounter (HOSPITAL_BASED_OUTPATIENT_CLINIC_OR_DEPARTMENT_OTHER): Payer: Self-pay | Admitting: Student

## 2023-07-31 ENCOUNTER — Ambulatory Visit (INDEPENDENT_AMBULATORY_CARE_PROVIDER_SITE_OTHER): Payer: 59 | Admitting: Student

## 2023-07-31 VITALS — BP 130/76 | HR 75 | Temp 98.0°F | Wt 201.9 lb

## 2023-07-31 DIAGNOSIS — D689 Coagulation defect, unspecified: Secondary | ICD-10-CM

## 2023-07-31 DIAGNOSIS — K922 Gastrointestinal hemorrhage, unspecified: Secondary | ICD-10-CM

## 2023-07-31 DIAGNOSIS — N183 Chronic kidney disease, stage 3 unspecified: Secondary | ICD-10-CM | POA: Diagnosis not present

## 2023-07-31 DIAGNOSIS — N179 Acute kidney failure, unspecified: Secondary | ICD-10-CM

## 2023-07-31 DIAGNOSIS — G2581 Restless legs syndrome: Secondary | ICD-10-CM

## 2023-07-31 NOTE — Progress Notes (Signed)
Established Patient Office Visit  Subjective   Patient ID: Peggy Armstrong, female    DOB: 25-Aug-1945  Age: 78 y.o. MRN: [id number]  Chief Complaint  Patient presents with   Follow-up    Pt. Here for a hospital follow-up. Pt. Has concerns of bilateral foot and ankle edema. Pt. Also concerned about restless legs.     HPI  Bilateral LE Edema- Patient has been in the hospital. History of HFpEF.  Patient is currently taking Lasix 20 mg daily alongside Klor-Con 20 mEq daily.  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  Discussed tapering this once edema is under control.  Hypothyroidism - Taking medication regularly in the AM away from food and vitamins, etc. No recent change to skin or energy levels. Does note changes in hair. Last TSH was elevated.  Has begun back on thyroid medication since hospitalization, we will plan to redo thyroid labs in 2 weeks.  Restless leg syndrome- Feels like she can't stop moving leg at night. Cramps into legs at night.  Patient is agreeable to check an iron panel today.  Discussed magnesium supplementation.  History of the same.  Recent hospitalization 07/20/2023 due to acute GI bleed resulting in hemorrhagic shock and AKI.  Today will need BMP and CBC.  Patient was advised to hold Eliquis for at least 2 weeks following EGD.  Patient was advised to follow-up with GI for further recommendation regarding duodenal stenosis.  Patient had active bleeding within the second and third portions of duodenum.  Patient was given several units of PRBCs during hospitalization.  EGD 124 by Dr. Chales Abrahams. Restart Eliquis- on 08/04/23-discussed with patient. Virtual meeting 08/07/23 with Kendell Bane- GI surgeon. [facility] as it is felt that they have a solid reason for anemia.  For clotting disorder, will plan to restart Eliquis as above.  Patient Active Problem List   Diagnosis Date Noted   Hemorrhagic shock (HCC)  07/21/2023   ABLA (acute blood loss anemia) 07/21/2023   Acute GI bleeding 07/21/2023   AKI (acute kidney injury) (HCC) 07/21/2023   GI bleed 07/20/2023   Acute low back pain 07/18/2023   Upper GI bleed 06/20/2023   Long term current use of anticoagulant therapy 06/20/2023   Occult blood in stools 05/16/2023   Gastric polyps 05/16/2023   Generalized weakness 05/12/2023   Acute cystitis 05/12/2023   Dehydration 05/12/2023   Acute prerenal azotemia 05/12/2023   Paroxysmal atrial fibrillation (HCC) 05/12/2023   AAA (abdominal aortic aneurysm) (HCC) 05/12/2023   Hyperlipidemia 05/12/2023   GAD (generalized anxiety disorder) 05/12/2023   Acquired hypothyroidism 05/12/2023   Closed compression fracture of L2 lumbar vertebra, initial encounter (HCC) 05/11/2023   Restless leg 12/02/2022   Routine general medical examination at a health care facility 12/13/2019   Chronic diastolic CHF (congestive heart failure) (HCC) 11/08/2019   Morbid obesity (HCC) 11/08/2019   History of DVT (deep vein thrombosis) 11/08/2019   Need for immunization against influenza 07/18/2019   Right shoulder pain 07/26/2018   Need for vaccination 04/23/2018   Duodenal ulcer    Hematemesis 11/05/2017   Macrocytic anemia 11/05/2017   Melena 11/05/2017   Nausea & vomiting    Hypokalemia 07/03/2017   Essential hypertension    Gout    Clotting disorder (HCC)    GERD (gastroesophageal reflux disease)    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   CKD (chronic kidney disease), stage III (HCC) 06/09/2017   S/P  ERCP 05/05/2017   Peri-ampullary neoplasm    Atypical atrial flutter (HCC)    Duodenal mass    Cholangitis    Epigastric pain    Choledocholithiasis 03/22/2017   SIRS (systemic inflammatory response syndrome) (HCC) 03/22/2017   Poor dentition 08/14/2016   Iron deficiency anemia 08/02/2016   RA (rheumatoid arthritis) (HCC) 09/07/2015   Past Medical History:  Diagnosis Date   Acquired hypothyroidism     Atrial fibrillation (HCC)    Back injury    DVT (deep venous thrombosis) (HCC)    right DVT   GAD (generalized anxiety disorder)    GERD (gastroesophageal reflux disease)    Gout    Gout    Hypertension    RA (rheumatoid arthritis) (HCC)    Allergies  Allergen Reactions   Codeine Nausea And Vomiting    Made "very sick"   Tape       ROS Negative unless indicated in HPI.    Objective:     BP 130/76 (BP Location: Right Arm, Patient Position: Sitting, Cuff Size: Normal)   Pulse 75   Temp 98 F (36.7 C) (Oral)   Wt 201 lb 14.4 oz (91.6 kg)   SpO2 97%   BMI 39.43 kg/m  BP Readings from Last 3 Encounters:  07/31/23 130/76  07/27/23 132/70  07/18/23 129/84   Wt Readings from Last 3 Encounters:  07/31/23 201 lb 14.4 oz (91.6 kg)  07/27/23 206 lb 2.1 oz (93.5 kg)  07/18/23 190 lb (86.2 kg)   SpO2 Readings from Last 3 Encounters:  07/31/23 97%  07/27/23 98%  07/18/23 97%      Physical Exam Constitutional:      General: She is not in acute distress.    Appearance: Normal appearance. She is not ill-appearing.  HENT:     Head: Normocephalic and atraumatic.     Nose: Nose normal.  Eyes:     General: No scleral icterus.    Extraocular Movements: Extraocular movements intact.     Conjunctiva/sclera: Conjunctivae normal.     Pupils: Pupils are equal, round, and reactive to light.  Cardiovascular:     Rate and Rhythm: Normal rate and regular rhythm.     Pulses: Normal pulses.     Heart sounds: Normal heart sounds. No murmur heard.    No friction rub.  Pulmonary:     Effort: Pulmonary effort is normal. No respiratory distress.     Breath sounds: Rales present. No wheezing or rhonchi.  Musculoskeletal:        General: Normal range of motion.     Right lower leg: Edema (2+ Pitting) present.     Left lower leg: Edema (2+ Pitting) present.  Skin:    General: Skin is warm and dry.     Coloration: Skin is not jaundiced or pale.  Neurological:     General: No  focal deficit present.     Mental Status: She is alert.  Psychiatric:        Mood and Affect: Mood normal.        Behavior: Behavior normal.      No results found for any visits on 07/31/23.  Last CBC Lab Results  Component Value Date   WBC 5.8 07/27/2023   HGB 9.7 (L) 07/27/2023   HCT 30.3 (L) 07/27/2023   MCV 95.0 07/27/2023   MCH 30.4 07/27/2023   RDW 15.1 07/27/2023   PLT 88 (L) 07/27/2023   Last metabolic panel Lab Results  Component Value Date  GLUCOSE 87 07/25/2023   NA 135 07/25/2023   K 3.5 07/25/2023   CL 104 07/25/2023   CO2 25 07/25/2023   BUN 12 07/25/2023   CREATININE 0.82 07/25/2023   GFRNONAA >60 07/25/2023   CALCIUM 7.6 (L) 07/25/2023   PHOS 2.8 07/25/2023   PROT 3.4 (L) 07/22/2023   ALBUMIN 1.8 (L) 07/22/2023   LABGLOB 2.4 07/18/2023   BILITOT 0.8 07/22/2023   ALKPHOS 58 07/22/2023   AST 24 07/22/2023   ALT 19 07/22/2023   ANIONGAP 6 07/25/2023   Last lipids Lab Results  Component Value Date   CHOL 127 07/18/2023   HDL 25 (L) 07/18/2023   LDLCALC 80 07/18/2023   TRIG 122 07/18/2023   CHOLHDL 5.1 (H) 07/18/2023   Last hemoglobin A1c Lab Results  Component Value Date   HGBA1C 4.3 (L) 07/18/2023   Last thyroid functions Lab Results  Component Value Date   TSH 5.230 (H) 07/18/2023   Last vitamin B12 and Folate Lab Results  Component Value Date   VITAMINB12 1,052 (H) 05/12/2023   FOLATE 7.7 05/12/2023      The ASCVD Risk score (Arnett DK, et al., 2019) failed to calculate for the following reasons:   The valid total cholesterol range is 130 to 320 mg/dL    Assessment & Plan:   Gastrointestinal hemorrhage, unspecified gastrointestinal hemorrhage type Assessment & Plan: Recent hospitalization. Will follow-up on labs. Waiting to start Eliquis until 2 weeks after EGD which is approximately 08/04/2023. Will keep patient on short follow-up for now until known stable.  Orders: -     Basic metabolic panel -     CBC with  Differential/Platelet -     Iron, TIBC and Ferritin Panel  Clotting disorder [facility]) Assessment & Plan: Will plan to restart Eliquis as indicated around 08/04/2023.  IE- 2 weeks after her EGD.   Restless leg Assessment & Plan: History of the same.  Currently having nightly symptoms.  Discussed magnesium supplementation.  Will plan to check iron panel today, suspect that this will be low due to recent GI bleeding.  Will assess for any increased iron needs.  Discussed vitamin C supplementation alongside iron for increased absorption.  Orders: -     Basic metabolic panel -     Iron, TIBC and Ferritin Panel -     Magnesium  AKI (acute kidney injury) (HCC) Assessment & Plan: Recent AKI likely due to to hemorrhagic shock in the hospital.  Will assess kidney function today for any lasting effects.  Orders: -     Basic metabolic panel  Stage 3 chronic kidney disease, unspecified whether stage 3a or 3b CKD (HCC) Assessment & Plan: Recent AKI on CKD in the hospital likely due to GI hemorrhage and decreased perfusion to kidneys. Ordered BMP today to assess.    I have spent greater than 40 minutes charting, educating, diagnosing and managing this patient for this visit.   Return in about 2 weeks (around 08/14/2023) for Chronic Followup.    Teryl Lucy Acelyn Basham, PA-C

## 2023-07-31 NOTE — Assessment & Plan Note (Addendum)
History of the same.  Currently having nightly symptoms.  Discussed magnesium supplementation.  Will plan to check iron panel today, suspect that this will be low due to recent GI bleeding.  Will assess for any increased iron needs.  Discussed vitamin C supplementation alongside iron for increased absorption.

## 2023-07-31 NOTE — Assessment & Plan Note (Signed)
Will plan to restart Eliquis as indicated around 08/04/2023.  IE- 2 weeks after her EGD.

## 2023-07-31 NOTE — Assessment & Plan Note (Signed)
Recent hospitalization. Will follow-up on labs. Waiting to start Eliquis until 2 weeks after EGD which is approximately 08/04/2023. Will keep patient on short follow-up for now until known stable.

## 2023-07-31 NOTE — Assessment & Plan Note (Signed)
Recent AKI likely due to to hemorrhagic shock in the hospital.  Will assess kidney function today for any lasting effects.

## 2023-07-31 NOTE — Patient Instructions (Addendum)
It was nice to see you today!  As we discussed in clinic we will check on labs and let you know.  [place]ands for leg cramps. This can be found over the counter.  Magnesium glycinate 400mg  daily.  If you have any problems before your next visit feel free to message me via MyChart (minor issues or questions) or call the office, otherwise you may reach out to schedule an office visit.  Thank you! Gerilyn Pilgrim Carilyn Woolston, PA-C

## 2023-07-31 NOTE — Assessment & Plan Note (Addendum)
Recent AKI on CKD in the hospital likely due to GI hemorrhage and decreased perfusion to kidneys. Ordered BMP today to assess.

## 2023-08-01 LAB — CBC WITH DIFFERENTIAL/PLATELET
Basophils Absolute: 0 10*3/uL (ref 0.0–0.2)
Basos: 1 %
EOS (ABSOLUTE): 0.1 10*3/uL (ref 0.0–0.4)
Eos: 3 %
Hematocrit: 29.3 % — ABNORMAL LOW (ref 34.0–46.6)
Hemoglobin: 9.2 g/dL — ABNORMAL LOW (ref 11.1–15.9)
Immature Grans (Abs): 0.1 10*3/uL (ref 0.0–0.1)
Immature Granulocytes: 1 %
Lymphocytes Absolute: 0.5 10*3/uL — ABNORMAL LOW (ref 0.7–3.1)
Lymphs: 14 %
MCH: 30.2 pg (ref 26.6–33.0)
MCHC: 31.4 g/dL — ABNORMAL LOW (ref 31.5–35.7)
MCV: 96 fL (ref 79–97)
Monocytes Absolute: 0.4 10*3/uL (ref 0.1–0.9)
Monocytes: 9 %
Neutrophils Absolute: 2.8 10*3/uL (ref 1.4–7.0)
Neutrophils: 72 %
Platelets: 142 10*3/uL — ABNORMAL LOW (ref 150–450)
RBC: 3.05 x10E6/uL — ABNORMAL LOW (ref 3.77–5.28)
RDW: 13.6 % (ref 11.7–15.4)
WBC: 3.9 10*3/uL (ref 3.4–10.8)

## 2023-08-01 LAB — IRON,TIBC AND FERRITIN PANEL
Ferritin: 137 ng/mL (ref 15–150)
Iron Saturation: 9 % — CL (ref 15–55)
Iron: 22 ug/dL — ABNORMAL LOW (ref 27–139)
Total Iron Binding Capacity: 233 ug/dL — ABNORMAL LOW (ref 250–450)
UIBC: 211 ug/dL (ref 118–369)

## 2023-08-01 LAB — BASIC METABOLIC PANEL
BUN/Creatinine Ratio: 15 (ref 12–28)
BUN: 16 mg/dL (ref 8–27)
CO2: 24 mmol/L (ref 20–29)
Calcium: 7.8 mg/dL — ABNORMAL LOW (ref 8.7–10.3)
Chloride: 103 mmol/L (ref 96–106)
Creatinine, Ser: 1.05 mg/dL — ABNORMAL HIGH (ref 0.57–1.00)
Glucose: 101 mg/dL — ABNORMAL HIGH (ref 70–99)
Potassium: 3.3 mmol/L — ABNORMAL LOW (ref 3.5–5.2)
Sodium: 141 mmol/L (ref 134–144)
eGFR: 54 mL/min/{1.73_m2} — ABNORMAL LOW (ref >59)

## 2023-08-01 LAB — MAGNESIUM: Magnesium: 1.3 mg/dL — ABNORMAL LOW (ref 1.6–2.3)

## 2023-08-04 ENCOUNTER — Inpatient Hospital Stay: Payer: 59 | Admitting: Hematology and Oncology

## 2023-08-04 ENCOUNTER — Inpatient Hospital Stay: Payer: 59

## 2023-08-04 DIAGNOSIS — Z86718 Personal history of other venous thrombosis and embolism: Secondary | ICD-10-CM | POA: Diagnosis not present

## 2023-08-04 DIAGNOSIS — D509 Iron deficiency anemia, unspecified: Secondary | ICD-10-CM | POA: Diagnosis not present

## 2023-08-04 DIAGNOSIS — I130 Hypertensive heart and chronic kidney disease with heart failure and stage 1 through stage 4 chronic kidney disease, or unspecified chronic kidney disease: Secondary | ICD-10-CM | POA: Diagnosis not present

## 2023-08-04 DIAGNOSIS — Z87891 Personal history of nicotine dependence: Secondary | ICD-10-CM | POA: Diagnosis not present

## 2023-08-04 DIAGNOSIS — E782 Mixed hyperlipidemia: Secondary | ICD-10-CM | POA: Diagnosis not present

## 2023-08-04 DIAGNOSIS — K219 Gastro-esophageal reflux disease without esophagitis: Secondary | ICD-10-CM | POA: Diagnosis not present

## 2023-08-04 DIAGNOSIS — S2241XD Multiple fractures of ribs, right side, subsequent encounter for fracture with routine healing: Secondary | ICD-10-CM | POA: Diagnosis not present

## 2023-08-04 DIAGNOSIS — M51369 Other intervertebral disc degeneration, lumbar region without mention of lumbar back pain or lower extremity pain: Secondary | ICD-10-CM | POA: Diagnosis not present

## 2023-08-04 DIAGNOSIS — E785 Hyperlipidemia, unspecified: Secondary | ICD-10-CM | POA: Diagnosis not present

## 2023-08-04 DIAGNOSIS — I5032 Chronic diastolic (congestive) heart failure: Secondary | ICD-10-CM | POA: Diagnosis not present

## 2023-08-04 DIAGNOSIS — I2510 Atherosclerotic heart disease of native coronary artery without angina pectoris: Secondary | ICD-10-CM | POA: Diagnosis not present

## 2023-08-04 DIAGNOSIS — I4892 Unspecified atrial flutter: Secondary | ICD-10-CM | POA: Diagnosis not present

## 2023-08-04 DIAGNOSIS — E871 Hypo-osmolality and hyponatremia: Secondary | ICD-10-CM | POA: Diagnosis not present

## 2023-08-04 DIAGNOSIS — S3214XD Type 1 fracture of sacrum, subsequent encounter for fracture with routine healing: Secondary | ICD-10-CM | POA: Diagnosis not present

## 2023-08-04 DIAGNOSIS — N1832 Chronic kidney disease, stage 3b: Secondary | ICD-10-CM | POA: Diagnosis not present

## 2023-08-04 DIAGNOSIS — I482 Chronic atrial fibrillation, unspecified: Secondary | ICD-10-CM | POA: Diagnosis not present

## 2023-08-04 DIAGNOSIS — S32021D Stable burst fracture of second lumbar vertebra, subsequent encounter for fracture with routine healing: Secondary | ICD-10-CM | POA: Diagnosis not present

## 2023-08-04 DIAGNOSIS — M109 Gout, unspecified: Secondary | ICD-10-CM | POA: Diagnosis not present

## 2023-08-04 DIAGNOSIS — E039 Hypothyroidism, unspecified: Secondary | ICD-10-CM | POA: Diagnosis not present

## 2023-08-04 DIAGNOSIS — Z9181 History of falling: Secondary | ICD-10-CM | POA: Diagnosis not present

## 2023-08-07 ENCOUNTER — Other Ambulatory Visit (HOSPITAL_BASED_OUTPATIENT_CLINIC_OR_DEPARTMENT_OTHER): Payer: Self-pay | Admitting: Student

## 2023-08-07 DIAGNOSIS — I4892 Unspecified atrial flutter: Secondary | ICD-10-CM | POA: Diagnosis not present

## 2023-08-07 DIAGNOSIS — I130 Hypertensive heart and chronic kidney disease with heart failure and stage 1 through stage 4 chronic kidney disease, or unspecified chronic kidney disease: Secondary | ICD-10-CM | POA: Diagnosis not present

## 2023-08-07 DIAGNOSIS — M51369 Other intervertebral disc degeneration, lumbar region without mention of lumbar back pain or lower extremity pain: Secondary | ICD-10-CM | POA: Diagnosis not present

## 2023-08-07 DIAGNOSIS — Z9181 History of falling: Secondary | ICD-10-CM | POA: Diagnosis not present

## 2023-08-07 DIAGNOSIS — E039 Hypothyroidism, unspecified: Secondary | ICD-10-CM | POA: Diagnosis not present

## 2023-08-07 DIAGNOSIS — D132 Benign neoplasm of duodenum: Secondary | ICD-10-CM | POA: Diagnosis not present

## 2023-08-07 DIAGNOSIS — I5032 Chronic diastolic (congestive) heart failure: Secondary | ICD-10-CM | POA: Diagnosis not present

## 2023-08-07 DIAGNOSIS — E785 Hyperlipidemia, unspecified: Secondary | ICD-10-CM | POA: Diagnosis not present

## 2023-08-07 DIAGNOSIS — S2241XD Multiple fractures of ribs, right side, subsequent encounter for fracture with routine healing: Secondary | ICD-10-CM | POA: Diagnosis not present

## 2023-08-07 DIAGNOSIS — I2510 Atherosclerotic heart disease of native coronary artery without angina pectoris: Secondary | ICD-10-CM | POA: Diagnosis not present

## 2023-08-07 DIAGNOSIS — Z86718 Personal history of other venous thrombosis and embolism: Secondary | ICD-10-CM | POA: Diagnosis not present

## 2023-08-07 DIAGNOSIS — S32021D Stable burst fracture of second lumbar vertebra, subsequent encounter for fracture with routine healing: Secondary | ICD-10-CM | POA: Diagnosis not present

## 2023-08-07 DIAGNOSIS — M545 Low back pain, unspecified: Secondary | ICD-10-CM

## 2023-08-07 DIAGNOSIS — I482 Chronic atrial fibrillation, unspecified: Secondary | ICD-10-CM | POA: Diagnosis not present

## 2023-08-07 DIAGNOSIS — M109 Gout, unspecified: Secondary | ICD-10-CM | POA: Diagnosis not present

## 2023-08-07 DIAGNOSIS — E782 Mixed hyperlipidemia: Secondary | ICD-10-CM | POA: Diagnosis not present

## 2023-08-07 DIAGNOSIS — K219 Gastro-esophageal reflux disease without esophagitis: Secondary | ICD-10-CM | POA: Diagnosis not present

## 2023-08-07 DIAGNOSIS — N1832 Chronic kidney disease, stage 3b: Secondary | ICD-10-CM | POA: Diagnosis not present

## 2023-08-07 DIAGNOSIS — Z87891 Personal history of nicotine dependence: Secondary | ICD-10-CM | POA: Diagnosis not present

## 2023-08-07 DIAGNOSIS — D509 Iron deficiency anemia, unspecified: Secondary | ICD-10-CM | POA: Diagnosis not present

## 2023-08-07 DIAGNOSIS — E871 Hypo-osmolality and hyponatremia: Secondary | ICD-10-CM | POA: Diagnosis not present

## 2023-08-07 DIAGNOSIS — S3214XD Type 1 fracture of sacrum, subsequent encounter for fracture with routine healing: Secondary | ICD-10-CM | POA: Diagnosis not present

## 2023-08-09 DIAGNOSIS — E782 Mixed hyperlipidemia: Secondary | ICD-10-CM | POA: Diagnosis not present

## 2023-08-09 DIAGNOSIS — Z87891 Personal history of nicotine dependence: Secondary | ICD-10-CM | POA: Diagnosis not present

## 2023-08-09 DIAGNOSIS — Z9181 History of falling: Secondary | ICD-10-CM | POA: Diagnosis not present

## 2023-08-09 DIAGNOSIS — S3214XD Type 1 fracture of sacrum, subsequent encounter for fracture with routine healing: Secondary | ICD-10-CM | POA: Diagnosis not present

## 2023-08-09 DIAGNOSIS — I2510 Atherosclerotic heart disease of native coronary artery without angina pectoris: Secondary | ICD-10-CM | POA: Diagnosis not present

## 2023-08-09 DIAGNOSIS — S32021D Stable burst fracture of second lumbar vertebra, subsequent encounter for fracture with routine healing: Secondary | ICD-10-CM | POA: Diagnosis not present

## 2023-08-09 DIAGNOSIS — M51369 Other intervertebral disc degeneration, lumbar region without mention of lumbar back pain or lower extremity pain: Secondary | ICD-10-CM | POA: Diagnosis not present

## 2023-08-09 DIAGNOSIS — K219 Gastro-esophageal reflux disease without esophagitis: Secondary | ICD-10-CM | POA: Diagnosis not present

## 2023-08-09 DIAGNOSIS — I482 Chronic atrial fibrillation, unspecified: Secondary | ICD-10-CM | POA: Diagnosis not present

## 2023-08-09 DIAGNOSIS — E039 Hypothyroidism, unspecified: Secondary | ICD-10-CM | POA: Diagnosis not present

## 2023-08-09 DIAGNOSIS — N1832 Chronic kidney disease, stage 3b: Secondary | ICD-10-CM | POA: Diagnosis not present

## 2023-08-09 DIAGNOSIS — D509 Iron deficiency anemia, unspecified: Secondary | ICD-10-CM | POA: Diagnosis not present

## 2023-08-09 DIAGNOSIS — I5032 Chronic diastolic (congestive) heart failure: Secondary | ICD-10-CM | POA: Diagnosis not present

## 2023-08-09 DIAGNOSIS — E871 Hypo-osmolality and hyponatremia: Secondary | ICD-10-CM | POA: Diagnosis not present

## 2023-08-09 DIAGNOSIS — Z86718 Personal history of other venous thrombosis and embolism: Secondary | ICD-10-CM | POA: Diagnosis not present

## 2023-08-09 DIAGNOSIS — I130 Hypertensive heart and chronic kidney disease with heart failure and stage 1 through stage 4 chronic kidney disease, or unspecified chronic kidney disease: Secondary | ICD-10-CM | POA: Diagnosis not present

## 2023-08-09 DIAGNOSIS — I4892 Unspecified atrial flutter: Secondary | ICD-10-CM | POA: Diagnosis not present

## 2023-08-09 DIAGNOSIS — S2241XD Multiple fractures of ribs, right side, subsequent encounter for fracture with routine healing: Secondary | ICD-10-CM | POA: Diagnosis not present

## 2023-08-09 DIAGNOSIS — M109 Gout, unspecified: Secondary | ICD-10-CM | POA: Diagnosis not present

## 2023-08-09 DIAGNOSIS — E785 Hyperlipidemia, unspecified: Secondary | ICD-10-CM | POA: Diagnosis not present

## 2023-08-11 ENCOUNTER — Other Ambulatory Visit (HOSPITAL_BASED_OUTPATIENT_CLINIC_OR_DEPARTMENT_OTHER): Payer: Self-pay | Admitting: Student

## 2023-08-11 ENCOUNTER — Other Ambulatory Visit (HOSPITAL_BASED_OUTPATIENT_CLINIC_OR_DEPARTMENT_OTHER): Payer: Self-pay | Admitting: Cardiovascular Disease

## 2023-08-11 DIAGNOSIS — M51369 Other intervertebral disc degeneration, lumbar region without mention of lumbar back pain or lower extremity pain: Secondary | ICD-10-CM | POA: Diagnosis not present

## 2023-08-11 DIAGNOSIS — D509 Iron deficiency anemia, unspecified: Secondary | ICD-10-CM | POA: Diagnosis not present

## 2023-08-11 DIAGNOSIS — K219 Gastro-esophageal reflux disease without esophagitis: Secondary | ICD-10-CM | POA: Diagnosis not present

## 2023-08-11 DIAGNOSIS — S32021D Stable burst fracture of second lumbar vertebra, subsequent encounter for fracture with routine healing: Secondary | ICD-10-CM | POA: Diagnosis not present

## 2023-08-11 DIAGNOSIS — E871 Hypo-osmolality and hyponatremia: Secondary | ICD-10-CM | POA: Diagnosis not present

## 2023-08-11 DIAGNOSIS — I2510 Atherosclerotic heart disease of native coronary artery without angina pectoris: Secondary | ICD-10-CM | POA: Diagnosis not present

## 2023-08-11 DIAGNOSIS — S3214XD Type 1 fracture of sacrum, subsequent encounter for fracture with routine healing: Secondary | ICD-10-CM | POA: Diagnosis not present

## 2023-08-11 DIAGNOSIS — Z87891 Personal history of nicotine dependence: Secondary | ICD-10-CM | POA: Diagnosis not present

## 2023-08-11 DIAGNOSIS — E782 Mixed hyperlipidemia: Secondary | ICD-10-CM | POA: Diagnosis not present

## 2023-08-11 DIAGNOSIS — I5032 Chronic diastolic (congestive) heart failure: Secondary | ICD-10-CM | POA: Diagnosis not present

## 2023-08-11 DIAGNOSIS — N1832 Chronic kidney disease, stage 3b: Secondary | ICD-10-CM | POA: Diagnosis not present

## 2023-08-11 DIAGNOSIS — I10 Essential (primary) hypertension: Secondary | ICD-10-CM

## 2023-08-11 DIAGNOSIS — I4892 Unspecified atrial flutter: Secondary | ICD-10-CM | POA: Diagnosis not present

## 2023-08-11 DIAGNOSIS — E039 Hypothyroidism, unspecified: Secondary | ICD-10-CM | POA: Diagnosis not present

## 2023-08-11 DIAGNOSIS — Z9181 History of falling: Secondary | ICD-10-CM | POA: Diagnosis not present

## 2023-08-11 DIAGNOSIS — I130 Hypertensive heart and chronic kidney disease with heart failure and stage 1 through stage 4 chronic kidney disease, or unspecified chronic kidney disease: Secondary | ICD-10-CM | POA: Diagnosis not present

## 2023-08-11 DIAGNOSIS — M109 Gout, unspecified: Secondary | ICD-10-CM | POA: Diagnosis not present

## 2023-08-11 DIAGNOSIS — E785 Hyperlipidemia, unspecified: Secondary | ICD-10-CM | POA: Diagnosis not present

## 2023-08-11 DIAGNOSIS — S2241XD Multiple fractures of ribs, right side, subsequent encounter for fracture with routine healing: Secondary | ICD-10-CM | POA: Diagnosis not present

## 2023-08-11 DIAGNOSIS — I482 Chronic atrial fibrillation, unspecified: Secondary | ICD-10-CM | POA: Diagnosis not present

## 2023-08-11 DIAGNOSIS — Z86718 Personal history of other venous thrombosis and embolism: Secondary | ICD-10-CM | POA: Diagnosis not present

## 2023-08-14 ENCOUNTER — Ambulatory Visit (HOSPITAL_BASED_OUTPATIENT_CLINIC_OR_DEPARTMENT_OTHER): Payer: 59 | Admitting: Student

## 2023-08-14 ENCOUNTER — Other Ambulatory Visit (HOSPITAL_BASED_OUTPATIENT_CLINIC_OR_DEPARTMENT_OTHER): Payer: Self-pay | Admitting: Student

## 2023-08-14 ENCOUNTER — Encounter (HOSPITAL_BASED_OUTPATIENT_CLINIC_OR_DEPARTMENT_OTHER): Payer: Self-pay | Admitting: Student

## 2023-08-14 VITALS — BP 165/88 | HR 76 | Temp 98.4°F | Ht 60.0 in | Wt 200.2 lb

## 2023-08-14 DIAGNOSIS — K922 Gastrointestinal hemorrhage, unspecified: Secondary | ICD-10-CM | POA: Diagnosis not present

## 2023-08-14 DIAGNOSIS — L03114 Cellulitis of left upper limb: Secondary | ICD-10-CM | POA: Insufficient documentation

## 2023-08-14 DIAGNOSIS — D500 Iron deficiency anemia secondary to blood loss (chronic): Secondary | ICD-10-CM | POA: Diagnosis not present

## 2023-08-14 DIAGNOSIS — E039 Hypothyroidism, unspecified: Secondary | ICD-10-CM | POA: Diagnosis not present

## 2023-08-14 DIAGNOSIS — I2510 Atherosclerotic heart disease of native coronary artery without angina pectoris: Secondary | ICD-10-CM | POA: Diagnosis not present

## 2023-08-14 DIAGNOSIS — N1832 Chronic kidney disease, stage 3b: Secondary | ICD-10-CM | POA: Diagnosis not present

## 2023-08-14 DIAGNOSIS — K219 Gastro-esophageal reflux disease without esophagitis: Secondary | ICD-10-CM | POA: Diagnosis not present

## 2023-08-14 DIAGNOSIS — L03113 Cellulitis of right upper limb: Secondary | ICD-10-CM | POA: Insufficient documentation

## 2023-08-14 DIAGNOSIS — S2241XD Multiple fractures of ribs, right side, subsequent encounter for fracture with routine healing: Secondary | ICD-10-CM | POA: Diagnosis not present

## 2023-08-14 DIAGNOSIS — I482 Chronic atrial fibrillation, unspecified: Secondary | ICD-10-CM | POA: Diagnosis not present

## 2023-08-14 DIAGNOSIS — E785 Hyperlipidemia, unspecified: Secondary | ICD-10-CM | POA: Diagnosis not present

## 2023-08-14 DIAGNOSIS — I130 Hypertensive heart and chronic kidney disease with heart failure and stage 1 through stage 4 chronic kidney disease, or unspecified chronic kidney disease: Secondary | ICD-10-CM | POA: Diagnosis not present

## 2023-08-14 DIAGNOSIS — I5032 Chronic diastolic (congestive) heart failure: Secondary | ICD-10-CM | POA: Diagnosis not present

## 2023-08-14 DIAGNOSIS — M109 Gout, unspecified: Secondary | ICD-10-CM | POA: Diagnosis not present

## 2023-08-14 DIAGNOSIS — M545 Low back pain, unspecified: Secondary | ICD-10-CM

## 2023-08-14 DIAGNOSIS — Z87891 Personal history of nicotine dependence: Secondary | ICD-10-CM | POA: Diagnosis not present

## 2023-08-14 DIAGNOSIS — S3214XD Type 1 fracture of sacrum, subsequent encounter for fracture with routine healing: Secondary | ICD-10-CM | POA: Diagnosis not present

## 2023-08-14 DIAGNOSIS — D509 Iron deficiency anemia, unspecified: Secondary | ICD-10-CM | POA: Diagnosis not present

## 2023-08-14 DIAGNOSIS — M51369 Other intervertebral disc degeneration, lumbar region without mention of lumbar back pain or lower extremity pain: Secondary | ICD-10-CM | POA: Diagnosis not present

## 2023-08-14 DIAGNOSIS — S32021D Stable burst fracture of second lumbar vertebra, subsequent encounter for fracture with routine healing: Secondary | ICD-10-CM | POA: Diagnosis not present

## 2023-08-14 DIAGNOSIS — G8929 Other chronic pain: Secondary | ICD-10-CM

## 2023-08-14 DIAGNOSIS — Z9181 History of falling: Secondary | ICD-10-CM | POA: Diagnosis not present

## 2023-08-14 DIAGNOSIS — E871 Hypo-osmolality and hyponatremia: Secondary | ICD-10-CM | POA: Diagnosis not present

## 2023-08-14 DIAGNOSIS — E782 Mixed hyperlipidemia: Secondary | ICD-10-CM | POA: Diagnosis not present

## 2023-08-14 DIAGNOSIS — Z86718 Personal history of other venous thrombosis and embolism: Secondary | ICD-10-CM | POA: Diagnosis not present

## 2023-08-14 DIAGNOSIS — I4892 Unspecified atrial flutter: Secondary | ICD-10-CM | POA: Diagnosis not present

## 2023-08-14 HISTORY — DX: Cellulitis of left upper limb: L03.114

## 2023-08-14 MED ORDER — DOXYCYCLINE HYCLATE 100 MG PO TABS
100.0000 mg | ORAL_TABLET | Freq: Two times a day (BID) | ORAL | 0 refills | Status: AC
Start: 2023-08-14 — End: 2023-08-19

## 2023-08-14 MED ORDER — METHOCARBAMOL 500 MG PO TABS: 500.0000 mg | ORAL_TABLET | Freq: Four times a day (QID) | ORAL | 2 refills | Status: DC | PRN

## 2023-08-14 MED ORDER — FUROSEMIDE 40 MG PO TABS
80.0000 mg | ORAL_TABLET | Freq: Every day | ORAL | 3 refills | Status: DC
Start: 2023-08-14 — End: 2023-08-22

## 2023-08-14 NOTE — Progress Notes (Signed)
Established Patient Office Visit  Subjective   Patient ID: Peggy Armstrong, female    DOB: Jan 08, 1946  Age: 78 y.o. MRN: [id number]  Chief Complaint  Patient presents with   Follow-up    Pt. here for follow-up visit for chronic issues.     HPI  Heart failure-feet are still swollen. Hurting. R foot is tingling occasionally. Has restarted eliquis. 40 lasix has not gotten rid of fluid. Will try 80 of lasix. Appears to be stable.  GI bleed- patient is currently doing well and is stable.  No signs of clinical instability today.  Continue to monitor.  Cellulitis-patient notes a spot on her left arm    ROS Negative unless indicated in HPI.    Objective:     BP (!) 165/88   Pulse 76   Temp 98.4 F (36.9 C) (Oral)   Ht 5' (1.524 m)   Wt 200 lb 3.2 oz (90.8 kg)   SpO2 99%   BMI 39.10 kg/m    Physical Exam Constitutional:      General: She is not in acute distress.    Appearance: Normal appearance. She is not ill-appearing.  HENT:     Head: Normocephalic and atraumatic.     Right Ear: External ear normal.     Left Ear: External ear normal.     Nose: Nose normal.  Eyes:     General: No scleral icterus.    Conjunctiva/sclera: Conjunctivae normal.  Cardiovascular:     Rate and Rhythm: Normal rate and regular rhythm.     Pulses: Normal pulses.     Heart sounds: Normal heart sounds. No murmur heard.    No friction rub.  Pulmonary:     Effort: Pulmonary effort is normal. No respiratory distress.     Breath sounds: Normal breath sounds. No wheezing, rhonchi or rales.  Musculoskeletal:        General: Normal range of motion.     Right lower leg: Edema present.     Left lower leg: Edema present.  Skin:    General: Skin is warm and dry.     Findings: Lesion present.     Comments: Lesion noted on left upper arm-appears cellulitic.  Is currently indurated, but no fluctuance currently.  Neurological:     General: No focal deficit present.     Mental Status: She is  alert.  Psychiatric:        Mood and Affect: Mood normal.        Behavior: Behavior normal.      No results found for any visits on 08/14/23.    The ASCVD Risk score (Arnett DK, et al., 2019) failed to calculate for the following reasons:   The valid total cholesterol range is 130 to 320 mg/dL    Assessment & Plan:   Chronic low back pain, unspecified back pain laterality, unspecified whether sciatica present Assessment & Plan: Secondary to multiple recent fractures, patient is currently back brace and wheelchair.  Utilizing home health.  Patient noted hallucinations on Zanaflex.  Will order methocarbamol which she is tolerated in the past.   Gastrointestinal hemorrhage, unspecified gastrointestinal hemorrhage type Assessment & Plan: No signs of acute worsening or replete at this point.  BP was up today, pulse was WNL.  Stable-no concerns at this time.  Will plan to reassess.  Orders: -     CBC with Differential/Platelet; Future -     Reticulocytes; Future  Iron deficiency anemia due to chronic blood loss Assessment &  Plan: Likely secondary to history of GI blood loss-continue oral iron.  Possible referral to hematology for IV iron in the future and further management.  Orders: -     CBC with Differential/Platelet; Future -     Reticulocytes; Future -     B12 and Folate Panel; Future  Chronic diastolic CHF (congestive heart failure) (HCC) Assessment & Plan: Patient continues to retain fluid, and was having electrolyte derangements.  Will have close follow-up and assess for any further repletion needs via labs.  Will order furosemide to be taken 80 mg total daily to see if this will lower edema.  Discussed possibility of inpatient management if this is continuing to worsen or not alleviate at this point.  Concern for continued electrolyte depletion, will monitor closely.  Orders: -     Furosemide; Take 2 tablets (80 mg total) by mouth daily.  Dispense: 30 tablet; Refill:  3 -     Basic metabolic panel; Future  Cellulitis of left arm Assessment & Plan: Erythematous and indurated today-nonfluctuant.   Placed line around lesion with marker so that patient could assess for improvement or worsening. Patient should call clinic if this does not improve.  Ordered doxycycline.  Orders: -     Doxycycline Hyclate; Take 1 tablet (100 mg total) by mouth 2 (two) times daily for 5 days.  Dispense: 10 tablet; Refill: 0   I have spent greater than 30 minutes charting, educating, diagnosing and managing this patient for this visit.   Return in about 6 weeks (around 09/25/2023) for Chronic Followup.    Teryl Lucy Kenly Xiao, PA-C

## 2023-08-14 NOTE — Assessment & Plan Note (Signed)
Patient continues to retain fluid, and was having electrolyte derangements.  Will have close follow-up and assess for any further repletion needs via labs.  Will order furosemide to be taken 80 mg total daily to see if this will lower edema.  Discussed possibility of inpatient management if this is continuing to worsen or not alleviate at this point.  Concern for continued electrolyte depletion, will monitor closely.

## 2023-08-14 NOTE — Telephone Encounter (Signed)
Thank you for flagging. Patient noted hallucinations on zanaflex. Stopped the medication. Has taken methocarbamol before.

## 2023-08-14 NOTE — Assessment & Plan Note (Signed)
Erythematous and indurated today-nonfluctuant.   Placed line around lesion with marker so that patient could assess for improvement or worsening. Patient should call clinic if this does not improve.  Ordered doxycycline.

## 2023-08-14 NOTE — Assessment & Plan Note (Signed)
Secondary to multiple recent fractures, patient is currently back brace and wheelchair.  Utilizing home health.  Patient noted hallucinations on Zanaflex.  Will order methocarbamol which she is tolerated in the past.

## 2023-08-14 NOTE — Assessment & Plan Note (Signed)
Likely secondary to history of GI blood loss-continue oral iron.  Possible referral to hematology for IV iron in the future and further management.

## 2023-08-14 NOTE — Patient Instructions (Addendum)
It was nice to see you today!  As we discussed in clinic I will send in 80 mg of lasix daily for about 3 days. Let me know Friday if it has not gone down.  Please let me know if  If you have any problems before your next visit feel free to message me via MyChart (minor issues or questions) or call the office, otherwise you may reach out to schedule an office visit.  Thank you! Gerilyn Pilgrim Aanika Defoor, PA-C

## 2023-08-14 NOTE — Assessment & Plan Note (Signed)
No signs of acute worsening or replete at this point.  BP was up today, pulse was WNL.  Stable-no concerns at this time.  Will plan to reassess.

## 2023-08-15 DIAGNOSIS — E039 Hypothyroidism, unspecified: Secondary | ICD-10-CM | POA: Diagnosis not present

## 2023-08-15 DIAGNOSIS — I5032 Chronic diastolic (congestive) heart failure: Secondary | ICD-10-CM | POA: Diagnosis not present

## 2023-08-15 DIAGNOSIS — M109 Gout, unspecified: Secondary | ICD-10-CM | POA: Diagnosis not present

## 2023-08-15 DIAGNOSIS — S2241XD Multiple fractures of ribs, right side, subsequent encounter for fracture with routine healing: Secondary | ICD-10-CM | POA: Diagnosis not present

## 2023-08-15 DIAGNOSIS — S3214XD Type 1 fracture of sacrum, subsequent encounter for fracture with routine healing: Secondary | ICD-10-CM | POA: Diagnosis not present

## 2023-08-15 DIAGNOSIS — N1832 Chronic kidney disease, stage 3b: Secondary | ICD-10-CM | POA: Diagnosis not present

## 2023-08-15 DIAGNOSIS — M51369 Other intervertebral disc degeneration, lumbar region without mention of lumbar back pain or lower extremity pain: Secondary | ICD-10-CM | POA: Diagnosis not present

## 2023-08-15 DIAGNOSIS — S32021D Stable burst fracture of second lumbar vertebra, subsequent encounter for fracture with routine healing: Secondary | ICD-10-CM | POA: Diagnosis not present

## 2023-08-15 DIAGNOSIS — Z86718 Personal history of other venous thrombosis and embolism: Secondary | ICD-10-CM | POA: Diagnosis not present

## 2023-08-15 DIAGNOSIS — D509 Iron deficiency anemia, unspecified: Secondary | ICD-10-CM | POA: Diagnosis not present

## 2023-08-15 DIAGNOSIS — Z87891 Personal history of nicotine dependence: Secondary | ICD-10-CM | POA: Diagnosis not present

## 2023-08-15 DIAGNOSIS — I4892 Unspecified atrial flutter: Secondary | ICD-10-CM | POA: Diagnosis not present

## 2023-08-15 DIAGNOSIS — Z9181 History of falling: Secondary | ICD-10-CM | POA: Diagnosis not present

## 2023-08-15 DIAGNOSIS — E871 Hypo-osmolality and hyponatremia: Secondary | ICD-10-CM | POA: Diagnosis not present

## 2023-08-15 DIAGNOSIS — I482 Chronic atrial fibrillation, unspecified: Secondary | ICD-10-CM | POA: Diagnosis not present

## 2023-08-15 DIAGNOSIS — K219 Gastro-esophageal reflux disease without esophagitis: Secondary | ICD-10-CM | POA: Diagnosis not present

## 2023-08-15 DIAGNOSIS — I2510 Atherosclerotic heart disease of native coronary artery without angina pectoris: Secondary | ICD-10-CM | POA: Diagnosis not present

## 2023-08-15 DIAGNOSIS — I130 Hypertensive heart and chronic kidney disease with heart failure and stage 1 through stage 4 chronic kidney disease, or unspecified chronic kidney disease: Secondary | ICD-10-CM | POA: Diagnosis not present

## 2023-08-15 DIAGNOSIS — E782 Mixed hyperlipidemia: Secondary | ICD-10-CM | POA: Diagnosis not present

## 2023-08-15 DIAGNOSIS — E785 Hyperlipidemia, unspecified: Secondary | ICD-10-CM | POA: Diagnosis not present

## 2023-08-17 ENCOUNTER — Ambulatory Visit (HOSPITAL_BASED_OUTPATIENT_CLINIC_OR_DEPARTMENT_OTHER): Payer: 59 | Admitting: Student

## 2023-08-17 DIAGNOSIS — S3214XD Type 1 fracture of sacrum, subsequent encounter for fracture with routine healing: Secondary | ICD-10-CM | POA: Diagnosis not present

## 2023-08-17 DIAGNOSIS — I2510 Atherosclerotic heart disease of native coronary artery without angina pectoris: Secondary | ICD-10-CM | POA: Diagnosis not present

## 2023-08-17 DIAGNOSIS — E039 Hypothyroidism, unspecified: Secondary | ICD-10-CM | POA: Diagnosis not present

## 2023-08-17 DIAGNOSIS — I130 Hypertensive heart and chronic kidney disease with heart failure and stage 1 through stage 4 chronic kidney disease, or unspecified chronic kidney disease: Secondary | ICD-10-CM | POA: Diagnosis not present

## 2023-08-17 DIAGNOSIS — N1832 Chronic kidney disease, stage 3b: Secondary | ICD-10-CM | POA: Diagnosis not present

## 2023-08-17 DIAGNOSIS — Z86718 Personal history of other venous thrombosis and embolism: Secondary | ICD-10-CM | POA: Diagnosis not present

## 2023-08-17 DIAGNOSIS — E782 Mixed hyperlipidemia: Secondary | ICD-10-CM | POA: Diagnosis not present

## 2023-08-17 DIAGNOSIS — E785 Hyperlipidemia, unspecified: Secondary | ICD-10-CM | POA: Diagnosis not present

## 2023-08-17 DIAGNOSIS — K219 Gastro-esophageal reflux disease without esophagitis: Secondary | ICD-10-CM | POA: Diagnosis not present

## 2023-08-17 DIAGNOSIS — I4892 Unspecified atrial flutter: Secondary | ICD-10-CM | POA: Diagnosis not present

## 2023-08-17 DIAGNOSIS — I5032 Chronic diastolic (congestive) heart failure: Secondary | ICD-10-CM | POA: Diagnosis not present

## 2023-08-17 DIAGNOSIS — Z9181 History of falling: Secondary | ICD-10-CM | POA: Diagnosis not present

## 2023-08-17 DIAGNOSIS — S2241XD Multiple fractures of ribs, right side, subsequent encounter for fracture with routine healing: Secondary | ICD-10-CM | POA: Diagnosis not present

## 2023-08-17 DIAGNOSIS — Z87891 Personal history of nicotine dependence: Secondary | ICD-10-CM | POA: Diagnosis not present

## 2023-08-17 DIAGNOSIS — I482 Chronic atrial fibrillation, unspecified: Secondary | ICD-10-CM | POA: Diagnosis not present

## 2023-08-17 DIAGNOSIS — E871 Hypo-osmolality and hyponatremia: Secondary | ICD-10-CM | POA: Diagnosis not present

## 2023-08-17 DIAGNOSIS — M51369 Other intervertebral disc degeneration, lumbar region without mention of lumbar back pain or lower extremity pain: Secondary | ICD-10-CM | POA: Diagnosis not present

## 2023-08-17 DIAGNOSIS — D509 Iron deficiency anemia, unspecified: Secondary | ICD-10-CM | POA: Diagnosis not present

## 2023-08-17 DIAGNOSIS — S32021D Stable burst fracture of second lumbar vertebra, subsequent encounter for fracture with routine healing: Secondary | ICD-10-CM | POA: Diagnosis not present

## 2023-08-17 DIAGNOSIS — M109 Gout, unspecified: Secondary | ICD-10-CM | POA: Diagnosis not present

## 2023-08-17 NOTE — Progress Notes (Deleted)
 Telephone call: Daughter stated that they could not get her here until Tuesday for labs. Told to decrease lasix back down to 20mg  once daily.

## 2023-08-18 ENCOUNTER — Other Ambulatory Visit (HOSPITAL_BASED_OUTPATIENT_CLINIC_OR_DEPARTMENT_OTHER): Payer: 59

## 2023-08-18 DIAGNOSIS — K922 Gastrointestinal hemorrhage, unspecified: Secondary | ICD-10-CM | POA: Diagnosis not present

## 2023-08-18 DIAGNOSIS — D500 Iron deficiency anemia secondary to blood loss (chronic): Secondary | ICD-10-CM

## 2023-08-18 DIAGNOSIS — I5032 Chronic diastolic (congestive) heart failure: Secondary | ICD-10-CM | POA: Diagnosis not present

## 2023-08-18 NOTE — Addendum Note (Signed)
Addended by: Shelbie Proctor on: 08/18/2023 10:26 AM   Modules accepted: Orders

## 2023-08-18 NOTE — Addendum Note (Signed)
Addended by: Shelbie Proctor on: 08/18/2023 10:25 AM   Modules accepted: Orders

## 2023-08-19 LAB — CBC WITH DIFFERENTIAL/PLATELET
Basophils Absolute: 0 10*3/uL (ref 0.0–0.2)
Basos: 1 %
EOS (ABSOLUTE): 0.2 10*3/uL (ref 0.0–0.4)
Eos: 6 %
Hematocrit: 30.9 % — ABNORMAL LOW (ref 34.0–46.6)
Hemoglobin: 9.4 g/dL — ABNORMAL LOW (ref 11.1–15.9)
Immature Grans (Abs): 0 10*3/uL (ref 0.0–0.1)
Immature Granulocytes: 1 %
Lymphocytes Absolute: 0.5 10*3/uL — ABNORMAL LOW (ref 0.7–3.1)
Lymphs: 14 %
MCH: 29.9 pg (ref 26.6–33.0)
MCHC: 30.4 g/dL — ABNORMAL LOW (ref 31.5–35.7)
MCV: 98 fL — ABNORMAL HIGH (ref 79–97)
Monocytes Absolute: 0.3 10*3/uL (ref 0.1–0.9)
Monocytes: 9 %
Neutrophils Absolute: 2.5 10*3/uL (ref 1.4–7.0)
Neutrophils: 69 %
Platelets: 104 10*3/uL — ABNORMAL LOW (ref 150–450)
RBC: 3.14 x10E6/uL — ABNORMAL LOW (ref 3.77–5.28)
RDW: 14 % (ref 11.7–15.4)
WBC: 3.7 10*3/uL (ref 3.4–10.8)

## 2023-08-19 LAB — BASIC METABOLIC PANEL
BUN/Creatinine Ratio: 23 (ref 12–28)
BUN: 24 mg/dL (ref 8–27)
CO2: 26 mmol/L (ref 20–29)
Calcium: 8.9 mg/dL (ref 8.7–10.3)
Chloride: 102 mmol/L (ref 96–106)
Creatinine, Ser: 1.04 mg/dL — ABNORMAL HIGH (ref 0.57–1.00)
Glucose: 106 mg/dL — ABNORMAL HIGH (ref 70–99)
Potassium: 4.2 mmol/L (ref 3.5–5.2)
Sodium: 143 mmol/L (ref 134–144)
eGFR: 55 mL/min/{1.73_m2} — ABNORMAL LOW (ref >59)

## 2023-08-19 LAB — B12 AND FOLATE PANEL
Folate: 18 ng/mL (ref >3.0)
Vitamin B-12: 1014 pg/mL (ref 232–1245)

## 2023-08-19 LAB — RETICULOCYTES: Retic Ct Pct: 2.9 % — ABNORMAL HIGH (ref 0.6–2.6)

## 2023-08-21 DIAGNOSIS — Z86718 Personal history of other venous thrombosis and embolism: Secondary | ICD-10-CM | POA: Diagnosis not present

## 2023-08-21 DIAGNOSIS — S2241XD Multiple fractures of ribs, right side, subsequent encounter for fracture with routine healing: Secondary | ICD-10-CM | POA: Diagnosis not present

## 2023-08-21 DIAGNOSIS — E871 Hypo-osmolality and hyponatremia: Secondary | ICD-10-CM | POA: Diagnosis not present

## 2023-08-21 DIAGNOSIS — E039 Hypothyroidism, unspecified: Secondary | ICD-10-CM | POA: Diagnosis not present

## 2023-08-21 DIAGNOSIS — M109 Gout, unspecified: Secondary | ICD-10-CM | POA: Diagnosis not present

## 2023-08-21 DIAGNOSIS — K219 Gastro-esophageal reflux disease without esophagitis: Secondary | ICD-10-CM | POA: Diagnosis not present

## 2023-08-21 DIAGNOSIS — N1832 Chronic kidney disease, stage 3b: Secondary | ICD-10-CM | POA: Diagnosis not present

## 2023-08-21 DIAGNOSIS — M51369 Other intervertebral disc degeneration, lumbar region without mention of lumbar back pain or lower extremity pain: Secondary | ICD-10-CM | POA: Diagnosis not present

## 2023-08-21 DIAGNOSIS — I4892 Unspecified atrial flutter: Secondary | ICD-10-CM | POA: Diagnosis not present

## 2023-08-21 DIAGNOSIS — E782 Mixed hyperlipidemia: Secondary | ICD-10-CM | POA: Diagnosis not present

## 2023-08-21 DIAGNOSIS — I5032 Chronic diastolic (congestive) heart failure: Secondary | ICD-10-CM | POA: Diagnosis not present

## 2023-08-21 DIAGNOSIS — D509 Iron deficiency anemia, unspecified: Secondary | ICD-10-CM | POA: Diagnosis not present

## 2023-08-21 DIAGNOSIS — Z87891 Personal history of nicotine dependence: Secondary | ICD-10-CM | POA: Diagnosis not present

## 2023-08-21 DIAGNOSIS — I130 Hypertensive heart and chronic kidney disease with heart failure and stage 1 through stage 4 chronic kidney disease, or unspecified chronic kidney disease: Secondary | ICD-10-CM | POA: Diagnosis not present

## 2023-08-21 DIAGNOSIS — I482 Chronic atrial fibrillation, unspecified: Secondary | ICD-10-CM | POA: Diagnosis not present

## 2023-08-21 DIAGNOSIS — E785 Hyperlipidemia, unspecified: Secondary | ICD-10-CM | POA: Diagnosis not present

## 2023-08-21 DIAGNOSIS — S3214XD Type 1 fracture of sacrum, subsequent encounter for fracture with routine healing: Secondary | ICD-10-CM | POA: Diagnosis not present

## 2023-08-21 DIAGNOSIS — Z9181 History of falling: Secondary | ICD-10-CM | POA: Diagnosis not present

## 2023-08-21 DIAGNOSIS — I2510 Atherosclerotic heart disease of native coronary artery without angina pectoris: Secondary | ICD-10-CM | POA: Diagnosis not present

## 2023-08-21 DIAGNOSIS — S32021D Stable burst fracture of second lumbar vertebra, subsequent encounter for fracture with routine healing: Secondary | ICD-10-CM | POA: Diagnosis not present

## 2023-08-21 NOTE — Progress Notes (Signed)
 Please call patient to inform: Kidney function looks stable.  Electrolytes are looking stable.  Please continue once daily potassium.  And calcium as directed.  Anemia is getting slightly better and it looks like her body is doing a good job to try to create more red blood cells.  Vitamin B12 and folate look fine.  We had gone back down to 20 mg of Lasix in the morning when we talked at her last lab visit due to concern for electrolytes. They can restart a once daily dose of Lasix 40 mg in the morning until they see cardiology for further recommendations this week.  Please also ask how fluid is doing and if she is short of breath at all right now.

## 2023-08-22 ENCOUNTER — Other Ambulatory Visit (HOSPITAL_BASED_OUTPATIENT_CLINIC_OR_DEPARTMENT_OTHER): Payer: Self-pay | Admitting: Student

## 2023-08-22 DIAGNOSIS — F411 Generalized anxiety disorder: Secondary | ICD-10-CM

## 2023-08-22 DIAGNOSIS — I5032 Chronic diastolic (congestive) heart failure: Secondary | ICD-10-CM

## 2023-08-24 ENCOUNTER — Encounter: Payer: Self-pay | Admitting: Cardiology

## 2023-08-24 ENCOUNTER — Ambulatory Visit: Payer: 59 | Attending: Cardiology | Admitting: Cardiology

## 2023-08-24 VITALS — BP 140/80 | HR 75 | Ht 60.0 in | Wt 195.0 lb

## 2023-08-24 DIAGNOSIS — D6859 Other primary thrombophilia: Secondary | ICD-10-CM

## 2023-08-24 DIAGNOSIS — I5032 Chronic diastolic (congestive) heart failure: Secondary | ICD-10-CM

## 2023-08-24 DIAGNOSIS — R0609 Other forms of dyspnea: Secondary | ICD-10-CM | POA: Diagnosis not present

## 2023-08-24 DIAGNOSIS — K922 Gastrointestinal hemorrhage, unspecified: Secondary | ICD-10-CM

## 2023-08-24 DIAGNOSIS — R600 Localized edema: Secondary | ICD-10-CM | POA: Diagnosis not present

## 2023-08-24 DIAGNOSIS — I480 Paroxysmal atrial fibrillation: Secondary | ICD-10-CM | POA: Diagnosis not present

## 2023-08-24 MED ORDER — TORSEMIDE 20 MG PO TABS: 20.0000 mg | ORAL_TABLET | Freq: Every day | ORAL | 3 refills | Status: DC

## 2023-08-24 NOTE — Progress Notes (Signed)
 Cardiology Office Note:  .   Date:  08/24/2023  ID:  Peggy Armstrong, DOB 06-05-1946, MRN [id number] PCP: Ranae Plumber Ouida Sills  [facility] Providers Cardiologist:  Thurmon Fair, MD    History of Present Illness: .   Peggy Armstrong is a 78 y.o. 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  06/20/2023 echo EF 55 to 60%, mildly elevated PASP, LA moderately dilated, mitral valve degenerative with trivial MR 03/28/2017 echo EF 60 to 65%, mild LVH, grade 2 DD  Most recently evaluated by cardiology with Gillian Shields, NP on 11/26/2021, no formal complaints from a cardiac perspective, no changes were made to her plan of care and she was advised to follow-up in 1 year.    Admitted 07/20/2023 to 07/27/2023 [facility] for GI bleed and hemorrhagic shock, transfused with 8 units of PRBCs.  She continued to have GI bleeds, EGD revealed EMR ulcers, s/p IR embolization x 3 of GDA branches.  Eliquis was stopped 2 weeks.   She was recently evaluated by her PCP on 08/14/2023 following her recent hospitalization, concerning with fluid retention, her Lasix was doing increased to 80 mg daily, with ongoing concerns for electrolyte imbalance.  She presents today accompanied by her daughter for follow-up of pedal edema.  She has been doing fair since she was discharged from the hospital, following closely with her PCP as well as GI.  Her Eliquis has been restarted, she is tolerating this well without any hematochezia, hematuria, hemoptysis.  She is most bothered by pedal edema.  Recently her PCP adjusted her diuretic for a few days and she feels she had a good response initially however her edema has returned.  She is trying to keep her legs elevated.  She is not drinking more than 64 ounces of fluid per day. She denies chest pain, palpitations,  dyspnea, pnd, orthopnea, n, v, dizziness, syncope, edema, weight gain, or early satiety.   ROS: Review of Systems  Cardiovascular:  Positive for leg swelling.  Musculoskeletal:  Positive for back pain.  Endo/Heme/Allergies:  Bruises/bleeds easily.  All other systems reviewed and are negative.    Studies Reviewed: .        Cardiac Studies & Procedures   ______________________________________________________________________________________________     ECHOCARDIOGRAM  ECHOCARDIOGRAM COMPLETE 06/20/2023  Narrative ECHOCARDIOGRAM REPORT    Patient Name:   Peggy Armstrong Date of Exam: 06/20/2023 Medical Rec #:  [id number]         Height:       60.0 in Accession #:    [id number]        Weight:       194.9 lb Date of Birth:  Dec 08, 1945         BSA:          1.846 m Patient Age:    77 years          BP:           171/86 mmHg Patient Gender: F                 HR:           57 bpm. Exam Location:  Inpatient  Procedure: 2D Echo, Color Doppler and Cardiac Doppler  Indications:    Pre-operative cardiovascular examination  History:        Patient has prior history of Echocardiogram examinations, most  recent 03/28/2017. Arrythmias:Atrial Flutter; Risk Factors:Hypertension.  Sonographer:    Vern Claude Referring Phys: [id number] Cyndi Bender  IMPRESSIONS   1. Left ventricular ejection fraction, by estimation, is 55 to 60%. The left ventricle has normal function. The left ventricle has no regional wall motion abnormalities. The left ventricular internal cavity size was mildly dilated. Left ventricular diastolic parameters were normal. 2. Right ventricular systolic function is normal. The right ventricular size is normal. There is mildly elevated pulmonary artery systolic pressure. 3. Left atrial size was moderately dilated. 4. The mitral valve is degenerative. Trivial mitral valve regurgitation. No evidence of mitral stenosis. 5. The aortic valve was not well visualized. Aortic valve  regurgitation is not visualized. No aortic stenosis is present. 6. The inferior vena cava is normal in size with greater than 50% respiratory variability, suggesting right atrial pressure of 3 mmHg.  Comparison(s): Unable to load 2018 study.  FINDINGS Left Ventricle: Left ventricular ejection fraction, by estimation, is 55 to 60%. The left ventricle has normal function. The left ventricle has no regional wall motion abnormalities. The left ventricular internal cavity size was mildly dilated. There is no left ventricular hypertrophy. Left ventricular diastolic parameters were normal.  Right Ventricle: The right ventricular size is normal. No increase in right ventricular wall thickness. Right ventricular systolic function is normal. There is mildly elevated pulmonary artery systolic pressure. The tricuspid regurgitant velocity is 3.15 m/s, and with an assumed right atrial pressure of 3 mmHg, the estimated right ventricular systolic pressure is 42.7 mmHg.  Left Atrium: Left atrial size was moderately dilated.  Right Atrium: Right atrial size was normal in size.  Pericardium: There is no evidence of pericardial effusion.  Mitral Valve: The mitral valve is degenerative in appearance. Trivial mitral valve regurgitation. No evidence of mitral valve stenosis. MV peak gradient, 6.4 mmHg. The mean mitral valve gradient is 2.0 mmHg.  Tricuspid Valve: The tricuspid valve is normal in structure. Tricuspid valve regurgitation is not demonstrated. No evidence of tricuspid stenosis.  Aortic Valve: The aortic valve was not well visualized. Aortic valve regurgitation is not visualized. No aortic stenosis is present. Aortic valve mean gradient measures 4.0 mmHg. Aortic valve peak gradient measures 8.2 mmHg. Aortic valve area, by VTI measures 1.95 cm.  Pulmonic Valve: The pulmonic valve was normal in structure. Pulmonic valve regurgitation is not visualized. No evidence of pulmonic stenosis.  Aorta: The  aortic root and ascending aorta are structurally normal, with no evidence of dilitation.  Pulmonary Artery: The pulmonary artery is of normal size.  Venous: The inferior vena cava is normal in size with greater than 50% respiratory variability, suggesting right atrial pressure of 3 mmHg.  IAS/Shunts: The atrial septum is grossly normal.   LEFT VENTRICLE PLAX 2D LVIDd:         6.00 cm      Diastology LVIDs:         4.10 cm      LV e' medial:    9.79 cm/s LV PW:         1.00 cm      LV E/e' medial:  10.0 LV IVS:        0.80 cm      LV e' lateral:   11.40 cm/s LVOT diam:     1.90 cm      LV E/e' lateral: 8.6 LV SV:         62 LV SV Index:   33 LVOT Area:     2.84 cm  LV Volumes (MOD) LV vol d, MOD A2C: 122.0 ml LV vol d, MOD A4C: 190.0 ml LV vol s, MOD A2C: 50.0 ml LV vol s, MOD A4C: 92.7 ml LV SV MOD A2C:     72.0 ml LV SV MOD A4C:     190.0 ml LV SV MOD BP:      87.7 ml  RIGHT VENTRICLE             IVC RV Basal diam:  4.00 cm     IVC diam: 1.20 cm RV Mid diam:    3.40 cm RV S prime:     11.00 cm/s TAPSE (M-mode): 1.8 cm  LEFT ATRIUM              Index        RIGHT ATRIUM           Index LA diam:        5.20 cm  2.82 cm/m   RA Area:     13.60 cm LA Vol (A2C):   70.8 ml  38.35 ml/m  RA Volume:   33.40 ml  18.09 ml/m LA Vol (A4C):   102.0 ml 55.25 ml/m LA Biplane Vol: 86.4 ml  46.80 ml/m AORTIC VALVE                    PULMONIC VALVE AV Area (Vmax):    1.67 cm     PV Vmax:       1.12 m/s AV Area (Vmean):   1.43 cm     PV Peak grad:  5.0 mmHg AV Area (VTI):     1.95 cm AV Vmax:           143.00 cm/s AV Vmean:          94.900 cm/s AV VTI:            0.315 m AV Peak Grad:      8.2 mmHg AV Mean Grad:      4.0 mmHg LVOT Vmax:         84.20 cm/s LVOT Vmean:        48.000 cm/s LVOT VTI:          0.217 m LVOT/AV VTI ratio: 0.69  AORTA Ao Root diam: 3.00 cm Ao Asc diam:  3.30 cm  MITRAL VALVE               TRICUSPID VALVE MV Area (PHT): 4.31 cm    TR Peak  grad:   39.7 mmHg MV Area VTI:   1.69 cm    TR Vmax:        315.00 cm/s MV Peak grad:  6.4 mmHg MV Mean grad:  2.0 mmHg    SHUNTS MV Vmax:       1.26 m/s    Systemic VTI:  0.22 m MV Vmean:      67.7 cm/s   Systemic Diam: 1.90 cm MV Decel Time: 176 msec MV E velocity: 97.90 cm/s MV A velocity: 79.30 cm/s MV E/A ratio:  1.23  Riley Lam MD Electronically signed by Riley Lam MD Signature Date/Time: 06/20/2023/3:27:17 PM    Final          ______________________________________________________________________________________________      Risk Assessment/Calculations:    CHA2DS2-VASc Score = 5   This indicates a 7.2% annual risk of stroke. The patient's score is based upon: CHF History: 1 HTN History: 1 Diabetes History: 0 Stroke History: 0 Vascular Disease History: 0 Age Score: 2 Gender  Score: 1    HYPERTENSION CONTROL Vitals:   08/24/23 1418 08/24/23 1837  BP: (!) 140/80 (!) 140/80    The patient's blood pressure is elevated above target today.  In order to address the patient's elevated BP: Blood pressure will be monitored at home to determine if medication changes need to be made.          Physical Exam:   VS:  BP (!) 140/80   Pulse 75   Ht 5' (1.524 m)   Wt 195 lb (88.5 kg)   SpO2 97%   BMI 38.08 kg/m    Wt Readings from Last 3 Encounters:  08/24/23 195 lb (88.5 kg)  08/14/23 200 lb 3.2 oz (90.8 kg)  07/31/23 201 lb 14.4 oz (91.6 kg)    GEN: Well nourished, well developed in no acute distress NECK: No JVD; No carotid bruits CARDIAC: RRR, no murmurs, rubs, gallops RESPIRATORY:  Clear to auscultation without rales, wheezing or rhonchi  ABDOMEN: Soft, non-tender, non-distended EXTREMITIES: 2 pitting edema mid tibia; No deformity   ASSESSMENT AND PLAN: .   Paroxysmal atrial fibrillation/hypercoagulable state-CHA2DS2-VASc score of 5, EKG reviewed from 06/20/2023 she was maintaining sinus rhythm, her heart rate is controlled  today she sounds to be in sinus rhythm upon auscultation.  She has had issues with recurrent GI bleeds, following closely with GI and will see them on March 14.  Will repeat CBC today.  Continue Eliquis 5 mg twice daily-no indication for dose reduction, she is 78 years old, 195 pounds, creatinine 1.04.  Currently on amiodarone 200 mg daily--LFTs on 07/22/2023 were normal TSH slightly elevated at 5.230.  HFpEF-NYHA class II-III, lungs are clear but she does have significant pedal edema.  She has been on Lasix for close to a decade, not really having good diuresis.  We will discontinue her Lasix for now and start her on Dex 20 mg daily.  Most recent creatinine 1.04, potassium 4.2.  Will repeat BMET today, and again in a week.  Also check proBNP.  Pedal edema-this is most bothersome for her, likely multifactorial.  Encouraged her to elevate when possible.  Will transition her to [facility] per above.  Recurrent GI bleeds-following closely with GI, recently resumed her Eliquis, will repeat CBC.       Dispo: Stop Lasix, start Demadex 20 mg daily.  BMET today and proBNP, BMET again in 1 week.  Follow-up with Dr. Vincent Gros in 6 weeks.  Signed, Flossie Dibble, NP

## 2023-08-24 NOTE — Patient Instructions (Signed)
 Medication Instructions:  STOP: Lasix  START: Demedex 20mg  1 tablet daily   Lab Work: BMP, ProBNP- today  AND  Your physician recommends that you return for lab work in: 1 week You need to have labs done when you are fasting.  You can come Monday through Friday 8:30 am to 12:00 pm and 1:15 to 4:30. You do not need to make an appointment as the order has already been placed. The labs you are going to have done are BMET.    Testing/Procedures: None Ordered   Follow-Up: At [facility], you and your health needs are our priority.  As part of our continuing mission to provide you with exceptional heart care, we have created designated Provider Care Teams.  These Care Teams include your primary Cardiologist (physician) and Advanced Practice Providers (APPs -  Physician Assistants and Nurse Practitioners) who all work together to provide you with the care you need, when you need it.  We recommend signing up for the patient portal called "MyChart".  Sign up information is provided on this After Visit Summary.  MyChart is used to connect with patients for Virtual Visits (Telemedicine).  Patients are able to view lab/test results, encounter notes, upcoming appointments, etc.  Non-urgent messages can be sent to your provider as well.   To learn more about what you can do with MyChart, go to [email or web address].    Your next appointment:   6 week(s) with Dr. Vincent Gros  The format for your next appointment:   In Person  Provider:   Wallis Bamberg, NP   Other Instructions NA

## 2023-08-25 DIAGNOSIS — N1832 Chronic kidney disease, stage 3b: Secondary | ICD-10-CM | POA: Diagnosis not present

## 2023-08-25 DIAGNOSIS — E785 Hyperlipidemia, unspecified: Secondary | ICD-10-CM | POA: Diagnosis not present

## 2023-08-25 DIAGNOSIS — D509 Iron deficiency anemia, unspecified: Secondary | ICD-10-CM | POA: Diagnosis not present

## 2023-08-25 DIAGNOSIS — S2241XD Multiple fractures of ribs, right side, subsequent encounter for fracture with routine healing: Secondary | ICD-10-CM | POA: Diagnosis not present

## 2023-08-25 DIAGNOSIS — K219 Gastro-esophageal reflux disease without esophagitis: Secondary | ICD-10-CM | POA: Diagnosis not present

## 2023-08-25 DIAGNOSIS — Z87891 Personal history of nicotine dependence: Secondary | ICD-10-CM | POA: Diagnosis not present

## 2023-08-25 DIAGNOSIS — I130 Hypertensive heart and chronic kidney disease with heart failure and stage 1 through stage 4 chronic kidney disease, or unspecified chronic kidney disease: Secondary | ICD-10-CM | POA: Diagnosis not present

## 2023-08-25 DIAGNOSIS — E871 Hypo-osmolality and hyponatremia: Secondary | ICD-10-CM | POA: Diagnosis not present

## 2023-08-25 DIAGNOSIS — S32021D Stable burst fracture of second lumbar vertebra, subsequent encounter for fracture with routine healing: Secondary | ICD-10-CM | POA: Diagnosis not present

## 2023-08-25 DIAGNOSIS — I482 Chronic atrial fibrillation, unspecified: Secondary | ICD-10-CM | POA: Diagnosis not present

## 2023-08-25 DIAGNOSIS — I4892 Unspecified atrial flutter: Secondary | ICD-10-CM | POA: Diagnosis not present

## 2023-08-25 DIAGNOSIS — M109 Gout, unspecified: Secondary | ICD-10-CM | POA: Diagnosis not present

## 2023-08-25 DIAGNOSIS — E039 Hypothyroidism, unspecified: Secondary | ICD-10-CM | POA: Diagnosis not present

## 2023-08-25 DIAGNOSIS — Z9181 History of falling: Secondary | ICD-10-CM | POA: Diagnosis not present

## 2023-08-25 DIAGNOSIS — I5032 Chronic diastolic (congestive) heart failure: Secondary | ICD-10-CM | POA: Diagnosis not present

## 2023-08-25 DIAGNOSIS — M51369 Other intervertebral disc degeneration, lumbar region without mention of lumbar back pain or lower extremity pain: Secondary | ICD-10-CM | POA: Diagnosis not present

## 2023-08-25 DIAGNOSIS — I2510 Atherosclerotic heart disease of native coronary artery without angina pectoris: Secondary | ICD-10-CM | POA: Diagnosis not present

## 2023-08-25 DIAGNOSIS — Z86718 Personal history of other venous thrombosis and embolism: Secondary | ICD-10-CM | POA: Diagnosis not present

## 2023-08-25 DIAGNOSIS — E782 Mixed hyperlipidemia: Secondary | ICD-10-CM | POA: Diagnosis not present

## 2023-08-25 DIAGNOSIS — S3214XD Type 1 fracture of sacrum, subsequent encounter for fracture with routine healing: Secondary | ICD-10-CM | POA: Diagnosis not present

## 2023-08-25 LAB — BASIC METABOLIC PANEL WITH GFR
BUN/Creatinine Ratio: 21 (ref 12–28)
BUN: 23 mg/dL (ref 8–27)
CO2: 25 mmol/L (ref 20–29)
Calcium: 8.5 mg/dL — ABNORMAL LOW (ref 8.7–10.3)
Chloride: 103 mmol/L (ref 96–106)
Creatinine, Ser: 1.08 mg/dL — ABNORMAL HIGH (ref 0.57–1.00)
Glucose: 93 mg/dL (ref 70–99)
Potassium: 4.5 mmol/L (ref 3.5–5.2)
Sodium: 144 mmol/L (ref 134–144)
eGFR: 53 mL/min/1.73 — ABNORMAL LOW

## 2023-08-25 LAB — PRO B NATRIURETIC PEPTIDE: NT-Pro BNP: 1402 pg/mL — ABNORMAL HIGH (ref 0–738)

## 2023-08-30 ENCOUNTER — Telehealth (HOSPITAL_BASED_OUTPATIENT_CLINIC_OR_DEPARTMENT_OTHER): Payer: Self-pay

## 2023-08-30 DIAGNOSIS — E871 Hypo-osmolality and hyponatremia: Secondary | ICD-10-CM | POA: Diagnosis not present

## 2023-08-30 DIAGNOSIS — Z86718 Personal history of other venous thrombosis and embolism: Secondary | ICD-10-CM | POA: Diagnosis not present

## 2023-08-30 DIAGNOSIS — I130 Hypertensive heart and chronic kidney disease with heart failure and stage 1 through stage 4 chronic kidney disease, or unspecified chronic kidney disease: Secondary | ICD-10-CM | POA: Diagnosis not present

## 2023-08-30 DIAGNOSIS — M51369 Other intervertebral disc degeneration, lumbar region without mention of lumbar back pain or lower extremity pain: Secondary | ICD-10-CM | POA: Diagnosis not present

## 2023-08-30 DIAGNOSIS — I5032 Chronic diastolic (congestive) heart failure: Secondary | ICD-10-CM | POA: Diagnosis not present

## 2023-08-30 DIAGNOSIS — I2510 Atherosclerotic heart disease of native coronary artery without angina pectoris: Secondary | ICD-10-CM | POA: Diagnosis not present

## 2023-08-30 DIAGNOSIS — S2241XD Multiple fractures of ribs, right side, subsequent encounter for fracture with routine healing: Secondary | ICD-10-CM | POA: Diagnosis not present

## 2023-08-30 DIAGNOSIS — Z87891 Personal history of nicotine dependence: Secondary | ICD-10-CM | POA: Diagnosis not present

## 2023-08-30 DIAGNOSIS — D509 Iron deficiency anemia, unspecified: Secondary | ICD-10-CM | POA: Diagnosis not present

## 2023-08-30 DIAGNOSIS — Z9181 History of falling: Secondary | ICD-10-CM | POA: Diagnosis not present

## 2023-08-30 DIAGNOSIS — E782 Mixed hyperlipidemia: Secondary | ICD-10-CM | POA: Diagnosis not present

## 2023-08-30 DIAGNOSIS — I482 Chronic atrial fibrillation, unspecified: Secondary | ICD-10-CM | POA: Diagnosis not present

## 2023-08-30 DIAGNOSIS — E785 Hyperlipidemia, unspecified: Secondary | ICD-10-CM | POA: Diagnosis not present

## 2023-08-30 DIAGNOSIS — I4892 Unspecified atrial flutter: Secondary | ICD-10-CM | POA: Diagnosis not present

## 2023-08-30 DIAGNOSIS — K219 Gastro-esophageal reflux disease without esophagitis: Secondary | ICD-10-CM | POA: Diagnosis not present

## 2023-08-30 DIAGNOSIS — S32021D Stable burst fracture of second lumbar vertebra, subsequent encounter for fracture with routine healing: Secondary | ICD-10-CM | POA: Diagnosis not present

## 2023-08-30 DIAGNOSIS — N1832 Chronic kidney disease, stage 3b: Secondary | ICD-10-CM | POA: Diagnosis not present

## 2023-08-30 DIAGNOSIS — M109 Gout, unspecified: Secondary | ICD-10-CM | POA: Diagnosis not present

## 2023-08-30 DIAGNOSIS — E039 Hypothyroidism, unspecified: Secondary | ICD-10-CM | POA: Diagnosis not present

## 2023-08-30 DIAGNOSIS — S3214XD Type 1 fracture of sacrum, subsequent encounter for fracture with routine healing: Secondary | ICD-10-CM | POA: Diagnosis not present

## 2023-08-30 NOTE — Telephone Encounter (Signed)
 Left VO with verbal approval

## 2023-08-31 DIAGNOSIS — S2241XD Multiple fractures of ribs, right side, subsequent encounter for fracture with routine healing: Secondary | ICD-10-CM | POA: Diagnosis not present

## 2023-08-31 DIAGNOSIS — E871 Hypo-osmolality and hyponatremia: Secondary | ICD-10-CM | POA: Diagnosis not present

## 2023-08-31 DIAGNOSIS — I482 Chronic atrial fibrillation, unspecified: Secondary | ICD-10-CM | POA: Diagnosis not present

## 2023-08-31 DIAGNOSIS — I2510 Atherosclerotic heart disease of native coronary artery without angina pectoris: Secondary | ICD-10-CM | POA: Diagnosis not present

## 2023-08-31 DIAGNOSIS — I5032 Chronic diastolic (congestive) heart failure: Secondary | ICD-10-CM | POA: Diagnosis not present

## 2023-08-31 DIAGNOSIS — M109 Gout, unspecified: Secondary | ICD-10-CM | POA: Diagnosis not present

## 2023-08-31 DIAGNOSIS — D509 Iron deficiency anemia, unspecified: Secondary | ICD-10-CM | POA: Diagnosis not present

## 2023-08-31 DIAGNOSIS — I4892 Unspecified atrial flutter: Secondary | ICD-10-CM | POA: Diagnosis not present

## 2023-08-31 DIAGNOSIS — E039 Hypothyroidism, unspecified: Secondary | ICD-10-CM | POA: Diagnosis not present

## 2023-08-31 DIAGNOSIS — Z86718 Personal history of other venous thrombosis and embolism: Secondary | ICD-10-CM | POA: Diagnosis not present

## 2023-08-31 DIAGNOSIS — Z9181 History of falling: Secondary | ICD-10-CM | POA: Diagnosis not present

## 2023-08-31 DIAGNOSIS — S32021D Stable burst fracture of second lumbar vertebra, subsequent encounter for fracture with routine healing: Secondary | ICD-10-CM | POA: Diagnosis not present

## 2023-08-31 DIAGNOSIS — K219 Gastro-esophageal reflux disease without esophagitis: Secondary | ICD-10-CM | POA: Diagnosis not present

## 2023-08-31 DIAGNOSIS — Z87891 Personal history of nicotine dependence: Secondary | ICD-10-CM | POA: Diagnosis not present

## 2023-08-31 DIAGNOSIS — E782 Mixed hyperlipidemia: Secondary | ICD-10-CM | POA: Diagnosis not present

## 2023-08-31 DIAGNOSIS — I130 Hypertensive heart and chronic kidney disease with heart failure and stage 1 through stage 4 chronic kidney disease, or unspecified chronic kidney disease: Secondary | ICD-10-CM | POA: Diagnosis not present

## 2023-08-31 DIAGNOSIS — N1832 Chronic kidney disease, stage 3b: Secondary | ICD-10-CM | POA: Diagnosis not present

## 2023-08-31 DIAGNOSIS — S3214XD Type 1 fracture of sacrum, subsequent encounter for fracture with routine healing: Secondary | ICD-10-CM | POA: Diagnosis not present

## 2023-08-31 DIAGNOSIS — M51369 Other intervertebral disc degeneration, lumbar region without mention of lumbar back pain or lower extremity pain: Secondary | ICD-10-CM | POA: Diagnosis not present

## 2023-08-31 DIAGNOSIS — E785 Hyperlipidemia, unspecified: Secondary | ICD-10-CM | POA: Diagnosis not present

## 2023-09-01 ENCOUNTER — Other Ambulatory Visit (HOSPITAL_BASED_OUTPATIENT_CLINIC_OR_DEPARTMENT_OTHER): Payer: Self-pay | Admitting: Cardiovascular Disease

## 2023-09-01 ENCOUNTER — Other Ambulatory Visit (HOSPITAL_BASED_OUTPATIENT_CLINIC_OR_DEPARTMENT_OTHER): Payer: Self-pay | Admitting: Student

## 2023-09-01 DIAGNOSIS — G8929 Other chronic pain: Secondary | ICD-10-CM

## 2023-09-01 NOTE — Telephone Encounter (Signed)
 Copied from CRM [id number]. Topic: Clinical - Medication Refill >> Sep 01, 2023  8:40 AM Dimitri Ped wrote: Most Recent Primary Care Visit:  Provider: [facility] SUPPORT  Department: [facility]  Visit Type: LAB  Date: 08/18/2023  Medication: methocarbamol (ROBAXIN) 500 MG tablet   Has the patient contacted their pharmacy? Yes they have the new one and not the old one . They took this one out the system patient doesn't want new muscle relaxer want the robaxi (Agent: If no, request that the patient contact the pharmacy for the refill. If patient does not wish to contact the pharmacy document the reason why and proceed with request.) (Agent: If yes, when and what did the pharmacy advise?)  Is this the correct pharmacy for this prescription? Yes If no, delete pharmacy and type the correct one.  This is the patient's preferred pharmacy:  [id number] - [place], [place] - [address] [place] [address] Phone: [phone] Fax: [phone]   Has the prescription been filled recently? No  Is the patient out of the medication? No 2 left   Has the patient been seen for an appointment in the last year OR does the patient have an upcoming appointment? Yes  Can we respond through MyChart? Yes  Agent: Please be advised that Rx refills may take up to 3 business days. We ask that you follow-up with your pharmacy.

## 2023-09-04 ENCOUNTER — Telehealth (HOSPITAL_BASED_OUTPATIENT_CLINIC_OR_DEPARTMENT_OTHER): Payer: Self-pay

## 2023-09-04 DIAGNOSIS — M109 Gout, unspecified: Secondary | ICD-10-CM | POA: Diagnosis not present

## 2023-09-04 DIAGNOSIS — I5032 Chronic diastolic (congestive) heart failure: Secondary | ICD-10-CM | POA: Diagnosis not present

## 2023-09-04 DIAGNOSIS — S3214XD Type 1 fracture of sacrum, subsequent encounter for fracture with routine healing: Secondary | ICD-10-CM | POA: Diagnosis not present

## 2023-09-04 DIAGNOSIS — I130 Hypertensive heart and chronic kidney disease with heart failure and stage 1 through stage 4 chronic kidney disease, or unspecified chronic kidney disease: Secondary | ICD-10-CM | POA: Diagnosis not present

## 2023-09-04 DIAGNOSIS — M51369 Other intervertebral disc degeneration, lumbar region without mention of lumbar back pain or lower extremity pain: Secondary | ICD-10-CM | POA: Diagnosis not present

## 2023-09-04 DIAGNOSIS — D62 Acute posthemorrhagic anemia: Secondary | ICD-10-CM | POA: Diagnosis not present

## 2023-09-04 DIAGNOSIS — E871 Hypo-osmolality and hyponatremia: Secondary | ICD-10-CM | POA: Diagnosis not present

## 2023-09-04 DIAGNOSIS — I4892 Unspecified atrial flutter: Secondary | ICD-10-CM | POA: Diagnosis not present

## 2023-09-04 DIAGNOSIS — S2241XD Multiple fractures of ribs, right side, subsequent encounter for fracture with routine healing: Secondary | ICD-10-CM | POA: Diagnosis not present

## 2023-09-04 DIAGNOSIS — G8929 Other chronic pain: Secondary | ICD-10-CM | POA: Diagnosis not present

## 2023-09-04 DIAGNOSIS — S32021D Stable burst fracture of second lumbar vertebra, subsequent encounter for fracture with routine healing: Secondary | ICD-10-CM | POA: Diagnosis not present

## 2023-09-04 DIAGNOSIS — N1832 Chronic kidney disease, stage 3b: Secondary | ICD-10-CM | POA: Diagnosis not present

## 2023-09-04 DIAGNOSIS — D509 Iron deficiency anemia, unspecified: Secondary | ICD-10-CM | POA: Diagnosis not present

## 2023-09-04 DIAGNOSIS — I2510 Atherosclerotic heart disease of native coronary artery without angina pectoris: Secondary | ICD-10-CM | POA: Diagnosis not present

## 2023-09-04 DIAGNOSIS — K922 Gastrointestinal hemorrhage, unspecified: Secondary | ICD-10-CM | POA: Diagnosis not present

## 2023-09-04 DIAGNOSIS — E039 Hypothyroidism, unspecified: Secondary | ICD-10-CM | POA: Diagnosis not present

## 2023-09-04 DIAGNOSIS — I482 Chronic atrial fibrillation, unspecified: Secondary | ICD-10-CM | POA: Diagnosis not present

## 2023-09-04 DIAGNOSIS — E876 Hypokalemia: Secondary | ICD-10-CM | POA: Diagnosis not present

## 2023-09-04 DIAGNOSIS — M47817 Spondylosis without myelopathy or radiculopathy, lumbosacral region: Secondary | ICD-10-CM | POA: Diagnosis not present

## 2023-09-04 DIAGNOSIS — K219 Gastro-esophageal reflux disease without esophagitis: Secondary | ICD-10-CM | POA: Diagnosis not present

## 2023-09-04 DIAGNOSIS — M069 Rheumatoid arthritis, unspecified: Secondary | ICD-10-CM | POA: Diagnosis not present

## 2023-09-04 NOTE — Telephone Encounter (Signed)
 Per [name], daughter, will be staying on methocarbamol.

## 2023-09-07 DIAGNOSIS — I2510 Atherosclerotic heart disease of native coronary artery without angina pectoris: Secondary | ICD-10-CM | POA: Diagnosis not present

## 2023-09-07 DIAGNOSIS — M51369 Other intervertebral disc degeneration, lumbar region without mention of lumbar back pain or lower extremity pain: Secondary | ICD-10-CM | POA: Diagnosis not present

## 2023-09-07 DIAGNOSIS — I130 Hypertensive heart and chronic kidney disease with heart failure and stage 1 through stage 4 chronic kidney disease, or unspecified chronic kidney disease: Secondary | ICD-10-CM | POA: Diagnosis not present

## 2023-09-07 DIAGNOSIS — N1832 Chronic kidney disease, stage 3b: Secondary | ICD-10-CM | POA: Diagnosis not present

## 2023-09-07 DIAGNOSIS — K922 Gastrointestinal hemorrhage, unspecified: Secondary | ICD-10-CM | POA: Diagnosis not present

## 2023-09-07 DIAGNOSIS — E039 Hypothyroidism, unspecified: Secondary | ICD-10-CM | POA: Diagnosis not present

## 2023-09-07 DIAGNOSIS — M069 Rheumatoid arthritis, unspecified: Secondary | ICD-10-CM | POA: Diagnosis not present

## 2023-09-07 DIAGNOSIS — I5032 Chronic diastolic (congestive) heart failure: Secondary | ICD-10-CM | POA: Diagnosis not present

## 2023-09-07 DIAGNOSIS — E876 Hypokalemia: Secondary | ICD-10-CM | POA: Diagnosis not present

## 2023-09-07 DIAGNOSIS — D509 Iron deficiency anemia, unspecified: Secondary | ICD-10-CM | POA: Diagnosis not present

## 2023-09-07 DIAGNOSIS — I482 Chronic atrial fibrillation, unspecified: Secondary | ICD-10-CM | POA: Diagnosis not present

## 2023-09-07 DIAGNOSIS — M109 Gout, unspecified: Secondary | ICD-10-CM | POA: Diagnosis not present

## 2023-09-07 DIAGNOSIS — G8929 Other chronic pain: Secondary | ICD-10-CM | POA: Diagnosis not present

## 2023-09-07 DIAGNOSIS — K219 Gastro-esophageal reflux disease without esophagitis: Secondary | ICD-10-CM | POA: Diagnosis not present

## 2023-09-07 DIAGNOSIS — M47817 Spondylosis without myelopathy or radiculopathy, lumbosacral region: Secondary | ICD-10-CM | POA: Diagnosis not present

## 2023-09-07 DIAGNOSIS — D62 Acute posthemorrhagic anemia: Secondary | ICD-10-CM | POA: Diagnosis not present

## 2023-09-07 DIAGNOSIS — S2241XD Multiple fractures of ribs, right side, subsequent encounter for fracture with routine healing: Secondary | ICD-10-CM | POA: Diagnosis not present

## 2023-09-07 DIAGNOSIS — I4892 Unspecified atrial flutter: Secondary | ICD-10-CM | POA: Diagnosis not present

## 2023-09-07 DIAGNOSIS — E871 Hypo-osmolality and hyponatremia: Secondary | ICD-10-CM | POA: Diagnosis not present

## 2023-09-07 DIAGNOSIS — S3214XD Type 1 fracture of sacrum, subsequent encounter for fracture with routine healing: Secondary | ICD-10-CM | POA: Diagnosis not present

## 2023-09-07 DIAGNOSIS — S32021D Stable burst fracture of second lumbar vertebra, subsequent encounter for fracture with routine healing: Secondary | ICD-10-CM | POA: Diagnosis not present

## 2023-09-08 ENCOUNTER — Ambulatory Visit: Payer: 59 | Admitting: Physician Assistant

## 2023-09-08 DIAGNOSIS — N1832 Chronic kidney disease, stage 3b: Secondary | ICD-10-CM | POA: Diagnosis not present

## 2023-09-08 DIAGNOSIS — M109 Gout, unspecified: Secondary | ICD-10-CM | POA: Diagnosis not present

## 2023-09-08 DIAGNOSIS — M51369 Other intervertebral disc degeneration, lumbar region without mention of lumbar back pain or lower extremity pain: Secondary | ICD-10-CM | POA: Diagnosis not present

## 2023-09-08 DIAGNOSIS — E039 Hypothyroidism, unspecified: Secondary | ICD-10-CM | POA: Diagnosis not present

## 2023-09-08 DIAGNOSIS — I4892 Unspecified atrial flutter: Secondary | ICD-10-CM | POA: Diagnosis not present

## 2023-09-08 DIAGNOSIS — D62 Acute posthemorrhagic anemia: Secondary | ICD-10-CM | POA: Diagnosis not present

## 2023-09-08 DIAGNOSIS — M069 Rheumatoid arthritis, unspecified: Secondary | ICD-10-CM | POA: Diagnosis not present

## 2023-09-08 DIAGNOSIS — I482 Chronic atrial fibrillation, unspecified: Secondary | ICD-10-CM | POA: Diagnosis not present

## 2023-09-08 DIAGNOSIS — I130 Hypertensive heart and chronic kidney disease with heart failure and stage 1 through stage 4 chronic kidney disease, or unspecified chronic kidney disease: Secondary | ICD-10-CM | POA: Diagnosis not present

## 2023-09-08 DIAGNOSIS — I2510 Atherosclerotic heart disease of native coronary artery without angina pectoris: Secondary | ICD-10-CM | POA: Diagnosis not present

## 2023-09-08 DIAGNOSIS — M47817 Spondylosis without myelopathy or radiculopathy, lumbosacral region: Secondary | ICD-10-CM | POA: Diagnosis not present

## 2023-09-08 DIAGNOSIS — E871 Hypo-osmolality and hyponatremia: Secondary | ICD-10-CM | POA: Diagnosis not present

## 2023-09-08 DIAGNOSIS — S2241XD Multiple fractures of ribs, right side, subsequent encounter for fracture with routine healing: Secondary | ICD-10-CM | POA: Diagnosis not present

## 2023-09-08 DIAGNOSIS — S3214XD Type 1 fracture of sacrum, subsequent encounter for fracture with routine healing: Secondary | ICD-10-CM | POA: Diagnosis not present

## 2023-09-08 DIAGNOSIS — K922 Gastrointestinal hemorrhage, unspecified: Secondary | ICD-10-CM | POA: Diagnosis not present

## 2023-09-08 DIAGNOSIS — S32021D Stable burst fracture of second lumbar vertebra, subsequent encounter for fracture with routine healing: Secondary | ICD-10-CM | POA: Diagnosis not present

## 2023-09-08 DIAGNOSIS — D509 Iron deficiency anemia, unspecified: Secondary | ICD-10-CM | POA: Diagnosis not present

## 2023-09-08 DIAGNOSIS — K219 Gastro-esophageal reflux disease without esophagitis: Secondary | ICD-10-CM | POA: Diagnosis not present

## 2023-09-08 DIAGNOSIS — E876 Hypokalemia: Secondary | ICD-10-CM | POA: Diagnosis not present

## 2023-09-08 DIAGNOSIS — I5032 Chronic diastolic (congestive) heart failure: Secondary | ICD-10-CM | POA: Diagnosis not present

## 2023-09-08 DIAGNOSIS — G8929 Other chronic pain: Secondary | ICD-10-CM | POA: Diagnosis not present

## 2023-09-11 ENCOUNTER — Telehealth: Payer: Self-pay | Admitting: Cardiology

## 2023-09-11 DIAGNOSIS — S32021D Stable burst fracture of second lumbar vertebra, subsequent encounter for fracture with routine healing: Secondary | ICD-10-CM | POA: Diagnosis not present

## 2023-09-11 DIAGNOSIS — K219 Gastro-esophageal reflux disease without esophagitis: Secondary | ICD-10-CM | POA: Diagnosis not present

## 2023-09-11 DIAGNOSIS — E871 Hypo-osmolality and hyponatremia: Secondary | ICD-10-CM | POA: Diagnosis not present

## 2023-09-11 DIAGNOSIS — I482 Chronic atrial fibrillation, unspecified: Secondary | ICD-10-CM | POA: Diagnosis not present

## 2023-09-11 DIAGNOSIS — K922 Gastrointestinal hemorrhage, unspecified: Secondary | ICD-10-CM | POA: Diagnosis not present

## 2023-09-11 DIAGNOSIS — M069 Rheumatoid arthritis, unspecified: Secondary | ICD-10-CM | POA: Diagnosis not present

## 2023-09-11 DIAGNOSIS — M47817 Spondylosis without myelopathy or radiculopathy, lumbosacral region: Secondary | ICD-10-CM | POA: Diagnosis not present

## 2023-09-11 DIAGNOSIS — S3214XD Type 1 fracture of sacrum, subsequent encounter for fracture with routine healing: Secondary | ICD-10-CM | POA: Diagnosis not present

## 2023-09-11 DIAGNOSIS — I5032 Chronic diastolic (congestive) heart failure: Secondary | ICD-10-CM | POA: Diagnosis not present

## 2023-09-11 DIAGNOSIS — I2510 Atherosclerotic heart disease of native coronary artery without angina pectoris: Secondary | ICD-10-CM | POA: Diagnosis not present

## 2023-09-11 DIAGNOSIS — G8929 Other chronic pain: Secondary | ICD-10-CM | POA: Diagnosis not present

## 2023-09-11 DIAGNOSIS — D509 Iron deficiency anemia, unspecified: Secondary | ICD-10-CM | POA: Diagnosis not present

## 2023-09-11 DIAGNOSIS — M109 Gout, unspecified: Secondary | ICD-10-CM | POA: Diagnosis not present

## 2023-09-11 DIAGNOSIS — D62 Acute posthemorrhagic anemia: Secondary | ICD-10-CM | POA: Diagnosis not present

## 2023-09-11 DIAGNOSIS — I130 Hypertensive heart and chronic kidney disease with heart failure and stage 1 through stage 4 chronic kidney disease, or unspecified chronic kidney disease: Secondary | ICD-10-CM | POA: Diagnosis not present

## 2023-09-11 DIAGNOSIS — N1832 Chronic kidney disease, stage 3b: Secondary | ICD-10-CM | POA: Diagnosis not present

## 2023-09-11 DIAGNOSIS — E876 Hypokalemia: Secondary | ICD-10-CM | POA: Diagnosis not present

## 2023-09-11 DIAGNOSIS — E039 Hypothyroidism, unspecified: Secondary | ICD-10-CM | POA: Diagnosis not present

## 2023-09-11 DIAGNOSIS — I4892 Unspecified atrial flutter: Secondary | ICD-10-CM | POA: Diagnosis not present

## 2023-09-11 DIAGNOSIS — M51369 Other intervertebral disc degeneration, lumbar region without mention of lumbar back pain or lower extremity pain: Secondary | ICD-10-CM | POA: Diagnosis not present

## 2023-09-11 DIAGNOSIS — S2241XD Multiple fractures of ribs, right side, subsequent encounter for fracture with routine healing: Secondary | ICD-10-CM | POA: Diagnosis not present

## 2023-09-11 NOTE — Telephone Encounter (Signed)
 Pt c/o swelling: STAT is pt has developed SOB within 24 hours  How much weight have you gained and in what time span?  15 lb increase since hospital admission in January  If swelling, where is the swelling located?  Increased fluid in legs + burning pain in legs when patient lays down. Unable to sleep with burning. Increased fatigue. Hasn't altered diet. Taking in ample fluids + no sodium, low fat diet. Urinating frequently but not losing any weight which increases fatigue and pain in legs.   Are you currently taking a fluid pill?  Increased SOB since being switched form Furosemide to Torsemide.  Are you currently SOB?  Increased difficulty breathing, not with patient to access SOB at this time.  Do you have a log of your daily weights (if so, list)?  200.4 lbs   Have you gained 3 pounds in a day or 5 pounds in a week?   Have you traveled recently?  No, can barely leave house. EMS does not prefer transporting patient. Tresa Endo has concerns with patient being on the 2nd floor with difficulties traveling up and down stairs. Please return call to [place] or patient's daughter,  [name].,

## 2023-09-11 NOTE — Telephone Encounter (Signed)
 Spoke with home Health nurse Tresa Endo- Advised of [facility] note regarding increasing Demadex, fluid restriction and to ED if SOB worsened. She stated that she feels like she may be hospital bound before long. Sent information to front desk to call daughter [name] to get patient in for appt.

## 2023-09-12 LAB — BASIC METABOLIC PANEL WITH GFR
BUN/Creatinine Ratio: 19 (ref 12–28)
BUN: 22 mg/dL (ref 8–27)
CO2: 28 mmol/L (ref 20–29)
Calcium: 8.7 mg/dL (ref 8.7–10.3)
Chloride: 101 mmol/L (ref 96–106)
Creatinine, Ser: 1.17 mg/dL — ABNORMAL HIGH (ref 0.57–1.00)
Glucose: 101 mg/dL — ABNORMAL HIGH (ref 70–99)
Potassium: 3.9 mmol/L (ref 3.5–5.2)
Sodium: 142 mmol/L (ref 134–144)
eGFR: 48 mL/min/1.73 — ABNORMAL LOW

## 2023-09-13 DIAGNOSIS — I4892 Unspecified atrial flutter: Secondary | ICD-10-CM | POA: Diagnosis not present

## 2023-09-13 DIAGNOSIS — E876 Hypokalemia: Secondary | ICD-10-CM | POA: Diagnosis not present

## 2023-09-13 DIAGNOSIS — S32021D Stable burst fracture of second lumbar vertebra, subsequent encounter for fracture with routine healing: Secondary | ICD-10-CM | POA: Diagnosis not present

## 2023-09-13 DIAGNOSIS — D62 Acute posthemorrhagic anemia: Secondary | ICD-10-CM | POA: Diagnosis not present

## 2023-09-13 DIAGNOSIS — I2510 Atherosclerotic heart disease of native coronary artery without angina pectoris: Secondary | ICD-10-CM | POA: Diagnosis not present

## 2023-09-13 DIAGNOSIS — N1832 Chronic kidney disease, stage 3b: Secondary | ICD-10-CM | POA: Diagnosis not present

## 2023-09-13 DIAGNOSIS — M109 Gout, unspecified: Secondary | ICD-10-CM | POA: Diagnosis not present

## 2023-09-13 DIAGNOSIS — I10 Essential (primary) hypertension: Secondary | ICD-10-CM | POA: Insufficient documentation

## 2023-09-13 DIAGNOSIS — I4891 Unspecified atrial fibrillation: Secondary | ICD-10-CM | POA: Insufficient documentation

## 2023-09-13 DIAGNOSIS — I482 Chronic atrial fibrillation, unspecified: Secondary | ICD-10-CM | POA: Diagnosis not present

## 2023-09-13 DIAGNOSIS — I82409 Acute embolism and thrombosis of unspecified deep veins of unspecified lower extremity: Secondary | ICD-10-CM | POA: Insufficient documentation

## 2023-09-13 DIAGNOSIS — S3992XA Unspecified injury of lower back, initial encounter: Secondary | ICD-10-CM | POA: Insufficient documentation

## 2023-09-13 DIAGNOSIS — S3214XD Type 1 fracture of sacrum, subsequent encounter for fracture with routine healing: Secondary | ICD-10-CM | POA: Diagnosis not present

## 2023-09-13 DIAGNOSIS — I5032 Chronic diastolic (congestive) heart failure: Secondary | ICD-10-CM | POA: Diagnosis not present

## 2023-09-13 DIAGNOSIS — E039 Hypothyroidism, unspecified: Secondary | ICD-10-CM | POA: Diagnosis not present

## 2023-09-13 DIAGNOSIS — S2241XD Multiple fractures of ribs, right side, subsequent encounter for fracture with routine healing: Secondary | ICD-10-CM | POA: Diagnosis not present

## 2023-09-13 DIAGNOSIS — M47817 Spondylosis without myelopathy or radiculopathy, lumbosacral region: Secondary | ICD-10-CM | POA: Diagnosis not present

## 2023-09-13 DIAGNOSIS — E871 Hypo-osmolality and hyponatremia: Secondary | ICD-10-CM | POA: Diagnosis not present

## 2023-09-13 DIAGNOSIS — G8929 Other chronic pain: Secondary | ICD-10-CM | POA: Diagnosis not present

## 2023-09-13 DIAGNOSIS — K219 Gastro-esophageal reflux disease without esophagitis: Secondary | ICD-10-CM | POA: Diagnosis not present

## 2023-09-13 DIAGNOSIS — D509 Iron deficiency anemia, unspecified: Secondary | ICD-10-CM | POA: Diagnosis not present

## 2023-09-13 DIAGNOSIS — M069 Rheumatoid arthritis, unspecified: Secondary | ICD-10-CM | POA: Diagnosis not present

## 2023-09-13 DIAGNOSIS — M51369 Other intervertebral disc degeneration, lumbar region without mention of lumbar back pain or lower extremity pain: Secondary | ICD-10-CM | POA: Diagnosis not present

## 2023-09-13 DIAGNOSIS — K922 Gastrointestinal hemorrhage, unspecified: Secondary | ICD-10-CM | POA: Diagnosis not present

## 2023-09-13 DIAGNOSIS — I130 Hypertensive heart and chronic kidney disease with heart failure and stage 1 through stage 4 chronic kidney disease, or unspecified chronic kidney disease: Secondary | ICD-10-CM | POA: Diagnosis not present

## 2023-09-14 ENCOUNTER — Telehealth (HOSPITAL_BASED_OUTPATIENT_CLINIC_OR_DEPARTMENT_OTHER): Payer: Self-pay

## 2023-09-14 DIAGNOSIS — I2510 Atherosclerotic heart disease of native coronary artery without angina pectoris: Secondary | ICD-10-CM | POA: Diagnosis not present

## 2023-09-14 DIAGNOSIS — D509 Iron deficiency anemia, unspecified: Secondary | ICD-10-CM | POA: Diagnosis not present

## 2023-09-14 DIAGNOSIS — E871 Hypo-osmolality and hyponatremia: Secondary | ICD-10-CM | POA: Diagnosis not present

## 2023-09-14 DIAGNOSIS — I5032 Chronic diastolic (congestive) heart failure: Secondary | ICD-10-CM | POA: Diagnosis not present

## 2023-09-14 DIAGNOSIS — I130 Hypertensive heart and chronic kidney disease with heart failure and stage 1 through stage 4 chronic kidney disease, or unspecified chronic kidney disease: Secondary | ICD-10-CM | POA: Diagnosis not present

## 2023-09-14 DIAGNOSIS — G8929 Other chronic pain: Secondary | ICD-10-CM | POA: Diagnosis not present

## 2023-09-14 DIAGNOSIS — S2241XD Multiple fractures of ribs, right side, subsequent encounter for fracture with routine healing: Secondary | ICD-10-CM | POA: Diagnosis not present

## 2023-09-14 DIAGNOSIS — E039 Hypothyroidism, unspecified: Secondary | ICD-10-CM | POA: Diagnosis not present

## 2023-09-14 DIAGNOSIS — S3214XD Type 1 fracture of sacrum, subsequent encounter for fracture with routine healing: Secondary | ICD-10-CM | POA: Diagnosis not present

## 2023-09-14 DIAGNOSIS — M069 Rheumatoid arthritis, unspecified: Secondary | ICD-10-CM | POA: Diagnosis not present

## 2023-09-14 DIAGNOSIS — M51369 Other intervertebral disc degeneration, lumbar region without mention of lumbar back pain or lower extremity pain: Secondary | ICD-10-CM | POA: Diagnosis not present

## 2023-09-14 DIAGNOSIS — D62 Acute posthemorrhagic anemia: Secondary | ICD-10-CM | POA: Diagnosis not present

## 2023-09-14 DIAGNOSIS — N1832 Chronic kidney disease, stage 3b: Secondary | ICD-10-CM | POA: Diagnosis not present

## 2023-09-14 DIAGNOSIS — I4892 Unspecified atrial flutter: Secondary | ICD-10-CM | POA: Diagnosis not present

## 2023-09-14 DIAGNOSIS — K922 Gastrointestinal hemorrhage, unspecified: Secondary | ICD-10-CM | POA: Diagnosis not present

## 2023-09-14 DIAGNOSIS — K219 Gastro-esophageal reflux disease without esophagitis: Secondary | ICD-10-CM | POA: Diagnosis not present

## 2023-09-14 DIAGNOSIS — E876 Hypokalemia: Secondary | ICD-10-CM | POA: Diagnosis not present

## 2023-09-14 DIAGNOSIS — I482 Chronic atrial fibrillation, unspecified: Secondary | ICD-10-CM | POA: Diagnosis not present

## 2023-09-14 DIAGNOSIS — M109 Gout, unspecified: Secondary | ICD-10-CM | POA: Diagnosis not present

## 2023-09-14 DIAGNOSIS — S32021D Stable burst fracture of second lumbar vertebra, subsequent encounter for fracture with routine healing: Secondary | ICD-10-CM | POA: Diagnosis not present

## 2023-09-14 DIAGNOSIS — M47817 Spondylosis without myelopathy or radiculopathy, lumbosacral region: Secondary | ICD-10-CM | POA: Diagnosis not present

## 2023-09-14 NOTE — Telephone Encounter (Signed)
-----   Message from Teryl Lucy Rothfuss sent at 09/13/2023  5:16 PM EDT ----- Regarding: RE: Call from [facility] Please call back tomorrow. I just looked at her chart and I suspect that the weight gain is likely to be fluid related, it looks like they are already in contact with cardiology. If there is any specific questions, please let me know. ----- Message ----- From: Shelbie Proctor, CMA Sent: 09/13/2023  10:02 AM EDT To: Teryl Lucy Rothfuss, PA-C Subject: Call from [facility]                           Copied & pasted from CRM. Please advise:   Caller/Agency: Karoline Caldwell [facility]  Callback Number: [phone] Service Requested: Skilled Nursing - evaluation due to weight gain Frequency: N/A Any new concerns about the patient? Yes -  weight gain +15 pounds in 1 month

## 2023-09-15 ENCOUNTER — Ambulatory Visit: Payer: Self-pay

## 2023-09-15 ENCOUNTER — Telehealth (HOSPITAL_BASED_OUTPATIENT_CLINIC_OR_DEPARTMENT_OTHER): Payer: Self-pay | Admitting: Student

## 2023-09-15 ENCOUNTER — Ambulatory Visit

## 2023-09-15 ENCOUNTER — Telehealth: Admitting: Family Medicine

## 2023-09-15 DIAGNOSIS — M109 Gout, unspecified: Secondary | ICD-10-CM

## 2023-09-15 MED ORDER — PREDNISONE 10 MG (21) PO TBPK: ORAL_TABLET | ORAL | 0 refills | Status: DC

## 2023-09-15 NOTE — Progress Notes (Signed)
 Virtual Visit Consent   Peggy Armstrong, you are scheduled for a virtual visit with a [facility] provider today. Just as with appointments in the office, your consent must be obtained to participate. Your consent will be active for this visit and any virtual visit you may have with one of our providers in the next 365 days. If you have a MyChart account, a copy of this consent can be sent to you electronically.  As this is a virtual visit, video technology does not allow for your provider to perform a traditional examination. This may limit your provider's ability to fully assess your condition. If your provider identifies any concerns that need to be evaluated in person or the need to arrange testing (such as labs, EKG, etc.), we will make arrangements to do so. Although advances in technology are sophisticated, we cannot ensure that it will always work on either your end or our end. If the connection with a video visit is poor, the visit may have to be switched to a telephone visit. With either a video or telephone visit, we are not always able to ensure that we have a secure connection.  By engaging in this virtual visit, you consent to the provision of healthcare and authorize for your insurance to be billed (if applicable) for the services provided during this visit. Depending on your insurance coverage, you may receive a charge related to this service.  I need to obtain your verbal consent now. Are you willing to proceed with your visit today? Peggy Armstrong has provided verbal consent on 09/15/2023 for a virtual visit (video or telephone). Georgana Curio, FNP  Date: 09/15/2023 11:34 AM   Virtual Visit via Video Note   I, Georgana Curio, connected with  Peggy Armstrong  ([id number], [birth year]) on 09/15/23 at 11:30 AM EDT by a video-enabled telemedicine application and verified that I am speaking with the correct person using two identifiers.  Location: Patient: Virtual Visit Location  Patient: Home Provider: Virtual Visit Location Provider: Home Office   I discussed the limitations of evaluation and management by telemedicine and the availability of in person appointments. The patient expressed understanding and agreed to proceed.    History of Present Illness: Peggy Armstrong is a 78 y.o. who identifies as a female who was assigned female at birth, and is being seen today for a gout flare in left hand with redness, pain and warmth. She reports it has flared here before and prednisone works well. Marland Kitchen  HPI: HPI  Problems:  Patient Active Problem List   Diagnosis Date Noted   Atrial fibrillation (HCC)    Back injury    DVT (deep venous thrombosis) (HCC)    Hypertension    Cellulitis of left arm 08/14/2023   Hemorrhagic shock (HCC) 07/21/2023   ABLA (acute blood loss anemia) 07/21/2023   Acute GI bleeding 07/21/2023   AKI (acute kidney injury) (HCC) 07/21/2023   GI bleed 07/20/2023   Chronic low back pain 07/18/2023   Upper GI bleed 06/20/2023   Long term current use of anticoagulant therapy 06/20/2023   Occult blood in stools 05/16/2023   Gastric polyps 05/16/2023   Generalized weakness 05/12/2023   Acute cystitis 05/12/2023   Dehydration 05/12/2023   Acute prerenal azotemia 05/12/2023   Paroxysmal atrial fibrillation (HCC) 05/12/2023   AAA (abdominal aortic aneurysm) (HCC) 05/12/2023   Hyperlipidemia 05/12/2023   GAD (generalized anxiety disorder) 05/12/2023   Acquired hypothyroidism 05/12/2023   Closed compression fracture of L2  lumbar vertebra, initial encounter (HCC) 05/11/2023   Restless leg 12/02/2022   Routine general medical examination at a health care facility 12/13/2019   Chronic diastolic CHF (congestive heart failure) (HCC) 11/08/2019   Morbid obesity (HCC) 11/08/2019   History of DVT (deep vein thrombosis) 11/08/2019   Need for immunization against influenza 07/18/2019   Right shoulder pain 07/26/2018   Need for vaccination 04/23/2018    Duodenal ulcer    Hematemesis 11/05/2017   Macrocytic anemia 11/05/2017   Melena 11/05/2017   Nausea & vomiting    Hypokalemia 07/03/2017   Essential hypertension    Gout    Clotting disorder (HCC)    GERD (gastroesophageal reflux disease)    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   CKD (chronic kidney disease), stage III (HCC) 06/09/2017   S/P ERCP 05/05/2017   Peri-ampullary neoplasm    Atypical atrial flutter (HCC)    Duodenal mass    Cholangitis    Epigastric pain    Choledocholithiasis 03/22/2017   SIRS (systemic inflammatory response syndrome) (HCC) 03/22/2017   Poor dentition 08/14/2016   Iron deficiency anemia 08/02/2016   RA (rheumatoid arthritis) (HCC) 09/07/2015    Allergies:  Allergies  Allergen Reactions   Codeine Nausea And Vomiting    Made "very sick"   Tape    Medications:  Current Outpatient Medications:    acetaminophen (TYLENOL) 325 MG tablet, Take 650 mg by mouth every 6 (six) hours as needed for mild pain (pain score 1-3) or moderate pain (pain score 4-6)., Disp: , Rfl:    allopurinol (ZYLOPRIM) 300 MG tablet, Take 1 tablet (300 mg total) by mouth daily., Disp: 90 tablet, Rfl: 3   amiodarone (PACERONE) 200 MG tablet, Take 1 tablet (200 mg total) by mouth daily., Disp: 90 tablet, Rfl: 3   Ascorbic Acid (VITAMIN C CR) 500 MG TBCR, Take 500 mg by mouth daily., Disp: , Rfl:    atorvastatin (LIPITOR) 20 MG tablet, Take 1 tablet (20 mg total) by mouth daily., Disp: 60 tablet, Rfl: 0   busPIRone (BUSPAR) 10 MG tablet, TAKE 1 TABLET BY MOUTH TWICE A DAY, Disp: 180 tablet, Rfl: 2   cholecalciferol (VITAMIN D) 1000 units tablet, Take 1,000 Units by mouth daily., Disp: , Rfl:    cyanocobalamin (VITAMIN B12) 1000 MCG tablet, Take 1 tablet by mouth daily., Disp: , Rfl:    ferrous sulfate 325 (65 FE) MG tablet, Take 325 mg by mouth daily with breakfast., Disp: , Rfl:    folic acid (FOLVITE) 1 MG tablet, Take 1 tablet (1 mg total) by mouth daily., Disp: , Rfl:     KLOR-CON M20 20 MEQ tablet, Take 20 mEq by mouth daily., Disp: , Rfl:    levothyroxine (SYNTHROID) 100 MCG tablet, Take 1 tablet (100 mcg total) by mouth daily before breakfast., Disp: 30 tablet, Rfl: 5   melatonin 3 MG TABS tablet, Take 1 tablet (3 mg total) by mouth at bedtime as needed (insomnia)., Disp: , Rfl:    methocarbamol (ROBAXIN) 500 MG tablet, Take 1 tablet (500 mg total) by mouth every 6 (six) hours as needed for muscle spasms., Disp: 30 tablet, Rfl: 2   ondansetron (ZOFRAN) 4 MG tablet, Take 4 mg by mouth every 6 (six) hours as needed for nausea or vomiting., Disp: , Rfl:    oxyCODONE (OXY IR/ROXICODONE) 5 MG immediate release tablet, Take 5 mg by mouth as needed for severe pain (pain score 7-10)., Disp: , Rfl:    pantoprazole (PROTONIX) 40  MG tablet, Take 1 tablet (40 mg total) by mouth daily., Disp: 30 tablet, Rfl: 5   sertraline (ZOLOFT) 25 MG tablet, TAKE 1 TABLET (25 MG TOTAL) BY MOUTH DAILY., Disp: 90 tablet, Rfl: 2   torsemide (DEMADEX) 20 MG tablet, Take 1 tablet (20 mg total) by mouth daily., Disp: 90 tablet, Rfl: 3  Observations/Objective: Patient is well-developed, well-nourished in no acute distress.  Resting comfortably  at home.  Head is normocephalic, atraumatic.  No labored breathing.  Speech is clear and coherent with logical content.  Patient is alert and oriented at baseline.    Assessment and Plan: There are no diagnoses linked to this encounter. May use tylenol with prednisone, UC if sx worsen.   Follow Up Instructions: I discussed the assessment and treatment plan with the patient. The patient was provided an opportunity to ask questions and all were answered. The patient agreed with the plan and demonstrated an understanding of the instructions.  A copy of instructions were sent to the patient via MyChart unless otherwise noted below.     The patient was advised to call back or seek an in-person evaluation if the symptoms worsen or if the condition  fails to improve as anticipated.    Georgana Curio, FNP

## 2023-09-15 NOTE — Patient Instructions (Signed)
Gout  Gout is a condition that causes painful swelling of the joints. Gout is a type of inflammation of the joints (arthritis). This condition is caused by having too much uric acid in the body. Uric acid is a chemical that forms when the body breaks down substances called purines. Purines are important for building body proteins. When the body has too much uric acid, sharp crystals can form and build up inside the joints. This causes pain and swelling. Gout attacks can happen quickly and may be very painful (acute gout). Over time, the attacks can affect more joints and become more frequent (chronic gout). Gout can also cause uric acid to build up under the skin and inside the kidneys. What are the causes? This condition is caused by too much uric acid in your blood. This can happen because: Your kidneys do not remove enough uric acid from your blood. This is the most common cause. Your body makes too much uric acid. This can happen with some cancers and cancer treatments. It can also occur if your body is breaking down too many red blood cells (hemolytic anemia). You eat too many foods that are high in purines. These foods include organ meats and some seafood. Alcohol, especially beer, is also high in purines. A gout attack may be triggered by trauma or stress. What increases the risk? The following factors may make you more likely to develop this condition: Having a family history of gout. Being female and middle-aged. Being female and having gone through menopause. Taking certain medicines, including aspirin, cyclosporine, diuretics, levodopa, and niacin. Having an organ transplant. Having certain conditions, such as: Being obese. Lead poisoning. Kidney disease. A skin condition called psoriasis. Other factors include: Losing weight too quickly. Being dehydrated. Frequently drinking alcohol, especially beer. Frequently drinking beverages that are sweetened with a type of sugar called  fructose. What are the signs or symptoms? An attack of acute gout happens quickly. It usually occurs in just one joint. The most common place is the big toe. Attacks often start at night. Other joints that may be affected include joints of the feet, ankle, knee, fingers, wrist, or elbow. Symptoms of this condition may include: Severe pain. Warmth. Swelling. Stiffness. Tenderness. The affected joint may be very painful to touch. Shiny, red, or purple skin. Chills and fever. Chronic gout may cause symptoms more frequently. More joints may be involved. You may also have white or yellow lumps (tophi) on your hands or feet or in other areas near your joints. How is this diagnosed? This condition is diagnosed based on your symptoms, your medical history, and a physical exam. You may have tests, such as: Blood tests to measure uric acid levels. Removal of joint fluid with a thin needle (aspiration) to look for uric acid crystals. X-rays to look for joint damage. How is this treated? Treatment for this condition has two phases: treating an acute attack and preventing future attacks. Acute gout treatment may include medicines to reduce pain and swelling, including: NSAIDs, such as ibuprofen. Steroids. These are strong anti-inflammatory medicines that can be taken by mouth (orally) or injected into a joint. Colchicine. This medicine relieves pain and swelling when it is taken soon after an attack. It can be given by mouth or through an IV. Preventive treatment may include: Daily use of smaller doses of NSAIDs or colchicine. Use of a medicine that reduces uric acid levels in your blood, such as allopurinol. Changes to your diet. You may need to see   a dietitian about what to eat and drink to prevent gout. Follow these instructions at home: During a gout attack  If directed, put ice on the affected area. To do this: Put ice in a plastic bag. Place a towel between your skin and the bag. Leave the  ice on for 20 minutes, 2-3 times a day. Remove the ice if your skin turns bright red. This is very important. If you cannot feel pain, heat, or cold, you have a greater risk of damage to the area. Raise (elevate) the affected joint above the level of your heart as often as possible. Rest the joint as much as possible. If the affected joint is in your leg, you may be given crutches to use. Follow instructions from your health care provider about eating or drinking restrictions. Avoiding future gout attacks Follow a low-purine diet as told by your dietitian or health care provider. Avoid foods and drinks that are high in purines, including liver, kidney, anchovies, asparagus, herring, mushrooms, mussels, and beer. Maintain a healthy weight or lose weight if you are overweight. If you want to lose weight, talk with your health care provider. Do not lose weight too quickly. Start or maintain an exercise program as told by your health care provider. Eating and drinking Avoid drinking beverages that contain fructose. Drink enough fluids to keep your urine pale yellow. If you drink alcohol: Limit how much you have to: 0-1 drink a day for women who are not pregnant. 0-2 drinks a day for men. Know how much alcohol is in a drink. In the [place] one drink equals one 12 oz bottle of beer (355 mL), one 5 oz glass of wine (148 mL), or one 1 oz glass of hard liquor (44 mL). General instructions Take over-the-counter and prescription medicines only as told by your health care provider. Ask your health care provider if the medicine prescribed to you requires you to avoid driving or using machinery. Return to your normal activities as told by your health care provider. Ask your health care provider what activities are safe for you. Keep all follow-up visits. This is important. Where to find more information National Institutes of Health: www.niams.nih.gov Contact a health care provider if you have: Another  gout attack. Continuing symptoms of a gout attack after 10 days of treatment. Side effects from your medicines. Chills or a fever. Burning pain when you urinate. Pain in your lower back or abdomen. Get help right away if you: Have severe or uncontrolled pain. Cannot urinate. Summary Gout is painful swelling of the joints caused by having too much uric acid in the body. The most common site for gout to occur is in the big toe, but it can affect other joints in the body. Medicines and dietary changes can help to prevent and treat gout attacks. This information is not intended to replace advice given to you by your health care provider. Make sure you discuss any questions you have with your health care provider. Document Revised: 03/17/2021 Document Reviewed: 03/17/2021 Elsevier Patient Education  2024 [employer].  

## 2023-09-15 NOTE — Telephone Encounter (Signed)
  Chief Complaint: arthritis, gout flare up Symptoms: pain, swelling, redness around joints Frequency: 3 days Pertinent Negatives: Patient denies fever, streaking, injury Disposition: [] ED /[x] Urgent Care (no appt availability in office) / [] Appointment(In office/virtual)/ []  [facility]/ [] Home Care/ [] Refused Recommended Disposition /[] [place] [facility]/ []  Follow-up with PCP Additional Notes: Patient daughter, [name] on [place], calls with patient present reporting arthritis and gout flare up x3 days. States patient is having swelling in R knee, L hand and previously in R great toe/foot. Per protocol, patient to be evaluated within 4 hours. First available appointment with PCP ior any provider in clinic outside of guideline. Caller states she cannot physically bring patient to appt, requests VV. Patient scheduled with first available virtual urgent care for today at 1130. Care advice reviewed, patient verbalized understanding and denies further questions at this time. Alerting PCP for review.    Copied from CRM [id number]. Topic: Clinical - Red Word Triage >> Sep 15, 2023  9:01 AM Marland Kitchen D wrote: Patient has arthritis and gout it has flared up really bad and she is in a lot of pain. Patient's daughter said she can't get her to the doctor due to the pain and wants to know if doctor can give her something for it. Reason for Disposition  [1] SEVERE pain (e.g., excruciating, unable to use hand at all) AND [2] not improved after 2 hours of pain medicine  Answer Assessment - Initial Assessment Questions 1. ONSET: "When did the pain start?"     3 days ago 2. LOCATION: "Where is the pain located?"     R knee, L hand finger, R great toe 3. PAIN: "How bad is the pain?" (Scale 1-10; or mild, moderate, severe)   - MILD (1-3): doesn't interfere with normal activities   - MODERATE (4-7): interferes with normal activities (e.g., work or school) or awakens from sleep   - SEVERE (8-10):  excruciating pain, unable to use hand at all     8/10 4. WORK OR EXERCISE: "Has there been any recent work or exercise that involved this part (i.e., hand or wrist) of the body?"     Denies 5. CAUSE: "What do you think is causing the pain?"     Gout and arthritis 6. AGGRAVATING FACTORS: "What makes the pain worse?" (e.g., using computer)     Not really, having a difficult time walking to the bathroom 7. OTHER SYMPTOMS: "Do you have any other symptoms?" (e.g., neck pain, swelling, rash, numbness, fever)    Swelling around joints  Protocols used: Hand and Wrist Pain-A-AH

## 2023-09-15 NOTE — Telephone Encounter (Signed)
 [facility] called and spoke to [place], [facility]. She says the Rx says 10 mg and the instructions say 5 mg, so the prescriber will need to clarify which dosage she wants the patient to take. I will notify Georgana Curio, FNP and send this to her for correction.  Copied from CRM [id number]. Topic: Clinical - Prescription Issue >> Sep 15, 2023  2:31 PM Abundio Miu S wrote: Reason for CRM: Patient's daughter states that pharmacy received incorrect information for medication, predniSONE (STERAPRED UNI-PAK 21 TAB) 10 MG (21) TBPK tablet. Daughter states that pharmacy is requesting that provider's office calls to clarify prescription.

## 2023-09-15 NOTE — Addendum Note (Signed)
 Addended by: Georgana Curio on: 09/15/2023 04:01 PM   Modules accepted: Orders

## 2023-09-18 ENCOUNTER — Telehealth (HOSPITAL_BASED_OUTPATIENT_CLINIC_OR_DEPARTMENT_OTHER): Payer: Self-pay

## 2023-09-18 DIAGNOSIS — D62 Acute posthemorrhagic anemia: Secondary | ICD-10-CM | POA: Diagnosis not present

## 2023-09-18 DIAGNOSIS — I4892 Unspecified atrial flutter: Secondary | ICD-10-CM | POA: Diagnosis not present

## 2023-09-18 DIAGNOSIS — D509 Iron deficiency anemia, unspecified: Secondary | ICD-10-CM | POA: Diagnosis not present

## 2023-09-18 DIAGNOSIS — K219 Gastro-esophageal reflux disease without esophagitis: Secondary | ICD-10-CM | POA: Diagnosis not present

## 2023-09-18 DIAGNOSIS — I5032 Chronic diastolic (congestive) heart failure: Secondary | ICD-10-CM | POA: Diagnosis not present

## 2023-09-18 DIAGNOSIS — M109 Gout, unspecified: Secondary | ICD-10-CM | POA: Diagnosis not present

## 2023-09-18 DIAGNOSIS — S32021D Stable burst fracture of second lumbar vertebra, subsequent encounter for fracture with routine healing: Secondary | ICD-10-CM | POA: Diagnosis not present

## 2023-09-18 DIAGNOSIS — M51369 Other intervertebral disc degeneration, lumbar region without mention of lumbar back pain or lower extremity pain: Secondary | ICD-10-CM | POA: Diagnosis not present

## 2023-09-18 DIAGNOSIS — S2241XD Multiple fractures of ribs, right side, subsequent encounter for fracture with routine healing: Secondary | ICD-10-CM | POA: Diagnosis not present

## 2023-09-18 DIAGNOSIS — I482 Chronic atrial fibrillation, unspecified: Secondary | ICD-10-CM | POA: Diagnosis not present

## 2023-09-18 DIAGNOSIS — N1832 Chronic kidney disease, stage 3b: Secondary | ICD-10-CM | POA: Diagnosis not present

## 2023-09-18 DIAGNOSIS — M069 Rheumatoid arthritis, unspecified: Secondary | ICD-10-CM | POA: Diagnosis not present

## 2023-09-18 DIAGNOSIS — M47817 Spondylosis without myelopathy or radiculopathy, lumbosacral region: Secondary | ICD-10-CM | POA: Diagnosis not present

## 2023-09-18 DIAGNOSIS — E876 Hypokalemia: Secondary | ICD-10-CM | POA: Diagnosis not present

## 2023-09-18 DIAGNOSIS — E039 Hypothyroidism, unspecified: Secondary | ICD-10-CM | POA: Diagnosis not present

## 2023-09-18 DIAGNOSIS — K922 Gastrointestinal hemorrhage, unspecified: Secondary | ICD-10-CM | POA: Diagnosis not present

## 2023-09-18 DIAGNOSIS — I2510 Atherosclerotic heart disease of native coronary artery without angina pectoris: Secondary | ICD-10-CM | POA: Diagnosis not present

## 2023-09-18 DIAGNOSIS — E871 Hypo-osmolality and hyponatremia: Secondary | ICD-10-CM | POA: Diagnosis not present

## 2023-09-18 DIAGNOSIS — G8929 Other chronic pain: Secondary | ICD-10-CM | POA: Diagnosis not present

## 2023-09-18 DIAGNOSIS — I130 Hypertensive heart and chronic kidney disease with heart failure and stage 1 through stage 4 chronic kidney disease, or unspecified chronic kidney disease: Secondary | ICD-10-CM | POA: Diagnosis not present

## 2023-09-18 DIAGNOSIS — S3214XD Type 1 fracture of sacrum, subsequent encounter for fracture with routine healing: Secondary | ICD-10-CM | POA: Diagnosis not present

## 2023-09-18 NOTE — Telephone Encounter (Signed)
 Called Marylene Land and gave her verbal order approval as below.  Caller/Agency: [facility] per Aniceto Boss  Callback Number: [phone] secure line Service Requested: Skilled Nursing Frequency: 1 week 4 weeks and every other for 1 week  Any new concerns about the patient? No

## 2023-09-20 DIAGNOSIS — I130 Hypertensive heart and chronic kidney disease with heart failure and stage 1 through stage 4 chronic kidney disease, or unspecified chronic kidney disease: Secondary | ICD-10-CM | POA: Diagnosis not present

## 2023-09-20 DIAGNOSIS — M51369 Other intervertebral disc degeneration, lumbar region without mention of lumbar back pain or lower extremity pain: Secondary | ICD-10-CM | POA: Diagnosis not present

## 2023-09-20 DIAGNOSIS — I4892 Unspecified atrial flutter: Secondary | ICD-10-CM | POA: Diagnosis not present

## 2023-09-20 DIAGNOSIS — S2241XD Multiple fractures of ribs, right side, subsequent encounter for fracture with routine healing: Secondary | ICD-10-CM | POA: Diagnosis not present

## 2023-09-20 DIAGNOSIS — K922 Gastrointestinal hemorrhage, unspecified: Secondary | ICD-10-CM | POA: Diagnosis not present

## 2023-09-20 DIAGNOSIS — D62 Acute posthemorrhagic anemia: Secondary | ICD-10-CM | POA: Diagnosis not present

## 2023-09-20 DIAGNOSIS — E871 Hypo-osmolality and hyponatremia: Secondary | ICD-10-CM | POA: Diagnosis not present

## 2023-09-20 DIAGNOSIS — G8929 Other chronic pain: Secondary | ICD-10-CM | POA: Diagnosis not present

## 2023-09-20 DIAGNOSIS — M109 Gout, unspecified: Secondary | ICD-10-CM | POA: Diagnosis not present

## 2023-09-20 DIAGNOSIS — I2510 Atherosclerotic heart disease of native coronary artery without angina pectoris: Secondary | ICD-10-CM | POA: Diagnosis not present

## 2023-09-20 DIAGNOSIS — S32021D Stable burst fracture of second lumbar vertebra, subsequent encounter for fracture with routine healing: Secondary | ICD-10-CM | POA: Diagnosis not present

## 2023-09-20 DIAGNOSIS — D509 Iron deficiency anemia, unspecified: Secondary | ICD-10-CM | POA: Diagnosis not present

## 2023-09-20 DIAGNOSIS — K219 Gastro-esophageal reflux disease without esophagitis: Secondary | ICD-10-CM | POA: Diagnosis not present

## 2023-09-20 DIAGNOSIS — N1832 Chronic kidney disease, stage 3b: Secondary | ICD-10-CM | POA: Diagnosis not present

## 2023-09-20 DIAGNOSIS — I5032 Chronic diastolic (congestive) heart failure: Secondary | ICD-10-CM | POA: Diagnosis not present

## 2023-09-20 DIAGNOSIS — E876 Hypokalemia: Secondary | ICD-10-CM | POA: Diagnosis not present

## 2023-09-20 DIAGNOSIS — M069 Rheumatoid arthritis, unspecified: Secondary | ICD-10-CM | POA: Diagnosis not present

## 2023-09-20 DIAGNOSIS — M47817 Spondylosis without myelopathy or radiculopathy, lumbosacral region: Secondary | ICD-10-CM | POA: Diagnosis not present

## 2023-09-20 DIAGNOSIS — I482 Chronic atrial fibrillation, unspecified: Secondary | ICD-10-CM | POA: Diagnosis not present

## 2023-09-20 DIAGNOSIS — E039 Hypothyroidism, unspecified: Secondary | ICD-10-CM | POA: Diagnosis not present

## 2023-09-20 DIAGNOSIS — S3214XD Type 1 fracture of sacrum, subsequent encounter for fracture with routine healing: Secondary | ICD-10-CM | POA: Diagnosis not present

## 2023-09-21 ENCOUNTER — Ambulatory Visit

## 2023-09-21 VITALS — BP 150/84 | HR 66 | Ht 65.0 in | Wt 200.0 lb

## 2023-09-21 DIAGNOSIS — I480 Paroxysmal atrial fibrillation: Secondary | ICD-10-CM | POA: Diagnosis not present

## 2023-09-21 DIAGNOSIS — I5032 Chronic diastolic (congestive) heart failure: Secondary | ICD-10-CM | POA: Diagnosis not present

## 2023-09-21 MED ORDER — TORSEMIDE 20 MG PO TABS
ORAL_TABLET | ORAL | 3 refills | Status: DC
Start: 2023-09-21 — End: 2024-02-25

## 2023-09-21 NOTE — Assessment & Plan Note (Signed)
 On physical exam appears to show regular rate and rhythm. On rhythm control with amiodarone.  Continue 200 mg once daily. On anticoagulation with Eliquis 5 mg twice daily. Hold Eliquis as needed for her upcoming GI procedures.

## 2023-09-21 NOTE — Progress Notes (Signed)
 Cardiology Consultation:    Date:  09/21/2023   ID:  Peggy Armstrong, DOB 11/02/1945, MRN [id number]  PCP:  Dominic Pea, PA-C  Cardiologist:  Marlyn Corporal Kamrin Sibley, MD   Referring MD: Dominic Pea, PA-C   No chief complaint on file.    ASSESSMENT AND PLAN:   [name] 78 year old woman  history of chronic heart failure with preserved ejection fraction, paroxysmal atrial fibrillation with CHADS2 Vascor 5 on anticoagulation with Eliquis and rhythm control with amiodarone, AAA, hypertension, GERD, history of recurrent GI bleed [last episode January 2025 was off Eliquis for a few weeks and later resumed], rheumatoid arthritis, CKD stage III, history of DVT, dyslipidemia with mildly elevated RVSP on prior echocardiogram December 2024, normal biventricular function with LVEF 55 to 60%. Problem List Items Addressed This Visit     Chronic diastolic CHF (congestive heart failure) (HCC) - Primary   Appears compensated.  Weight 200 pounds by our scale. Weight at home 197 pounds today. Ideal weight appears to be close to 193 pounds. Bilateral lower extremity pedal edema observed.  Continue with salt restriction to below 2 g/day, her daughter prepares her meals. Keep fluid intake to below 2 L/day. Continue torsemide, dose increased to 40 mg once daily in the morning for the next 7 days and after that to alternate 40 mg and 20 mg once a day dose.  Advised to continue monitoring daily weights. Advised her to keep her feet elevated when seated or reclined in order to facilitate venous drainage and decreasing pedal edema.  And once her pedal edema resolves she should have compression socks on.  Renal function limits use of Entresto. Limited mobility and high risk for UTI and perineal infections limits use of SGLT2 inhibitors. Her blood pressures at home have been relatively well-controlled. At subsequent follow-up will review her blood pressure logs and will consider adding  hydralazine/Isordil.      Relevant Medications   torsemide (DEMADEX) 20 MG tablet   Paroxysmal atrial fibrillation (HCC)   On physical exam appears to show regular rate and rhythm. On rhythm control with amiodarone.  Continue 200 mg once daily. On anticoagulation with Eliquis 5 mg twice daily. Hold Eliquis as needed for her upcoming GI procedures.       Relevant Medications   torsemide (DEMADEX) 20 MG tablet   Other Relevant Orders   EKG 12-Lead (Completed)      History of Present Illness:    Peggy Armstrong is a 78 y.o. female who is being seen today for follow-up visit. Last visit in our office was with Wallis Bamberg 08-24-2023. PCP is Rothfuss, Teryl Lucy, PA-C.  Has history of chronic heart failure with preserved ejection fraction, paroxysmal atrial fibrillation with CHADS2 Vascor 5 on anticoagulation with Eliquis and rhythm control with amiodarone, AAA, hypertension, GERD, history of recurrent GI bleed [last episode January 2025 was off Eliquis for a few weeks and later resumed], rheumatoid arthritis, CKD stage III, history of DVT, dyslipidemia with mildly elevated RVSP on prior echocardiogram December 2024, normal biventricular function with LVEF 55 to 60%.  At last office visit with [place] a month ago she was transition from Lasix to torsemide given ongoing pedal edema and poor diuretic response.  Blood work from 09-11-2023 with BUN 22, creatinine 1.17, EGFR 48, sodium 142, potassium 3.9 Last NT proBNP available from 08-24-2023 was elevated 1402.  Here for the visit today accompanied by her daughter with whom she lives at home in an apartment.  Ambulate  limited to using the wheelchair when she is outdoors.  Even in the house she is mostly limited to the wheelchair but at times uses a walker to move around. Going up the stairs at home typically ends up requiring help from fire department to bring her up the stairs.  Her home log of blood pressure readings have been reviewed.  At  discharge from the hospital in January she was around 187 pounds.  Weight has gone up gradually.  At home she apparently weighed around 197 pounds today.  Here by our scale she was 200 pounds.  Had good response with torsemide but has been taking extra doses 40 mg frequently with good response but once she goes back to a smaller dose she starts gaining the weight.  She is still noticing significant bilateral lower extremity pedal edema.  She sleeps in a reclined position in her hospital bed.  Denies any chest pain, palpitations, shortness of breath. Has GI procedures coming up end of April at [facility].  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   Cellulitis of left arm 08/14/2023   Cholangitis    Choledocholithiasis 03/22/2017   Chronic diastolic CHF (congestive heart failure) (HCC) 11/08/2019   Chronic low back pain 07/18/2023   CKD (chronic kidney disease), stage III (HCC) 06/09/2017   Closed compression fracture of L2 lumbar vertebra, initial encounter (HCC) 05/11/2023   Clotting disorder (HCC)    right DVT     Dehydration 05/12/2023   Duodenal mass    Duodenal ulcer    DVT (deep venous thrombosis) (HCC)    right DVT   Epigastric pain    Essential hypertension    GAD (generalized anxiety disorder)    Gastric polyps 05/16/2023   Generalized weakness 05/12/2023   GERD (gastroesophageal reflux disease)    GI bleed 07/20/2023   Gout    Gout    Hematemesis 11/05/2017   Hemorrhagic shock (HCC) 07/21/2023   History of DVT (deep vein thrombosis) 11/08/2019   Hyperlipidemia 05/12/2023   Hypertension     Hypokalemia 07/03/2017   Iron deficiency anemia 08/02/2016   Long term current use of anticoagulant therapy 06/20/2023   Macrocytic anemia 11/05/2017   Melena 11/05/2017   Morbid obesity (HCC) 11/08/2019   Nausea & vomiting    Need for immunization against influenza 07/18/2019   Need for vaccination 04/23/2018   Occult blood in stools 05/16/2023   Paroxysmal atrial fibrillation (HCC) 05/12/2023   Peri-ampullary neoplasm    Poor dentition 08/14/2016   RA (rheumatoid arthritis) (HCC)    Restless leg 12/02/2022   Right shoulder pain 07/26/2018   Routine general medical examination at a health care facility 12/13/2019   S/P ERCP 05/05/2017   SIRS (systemic inflammatory response syndrome) (HCC) 03/22/2017   Upper GI bleed 06/20/2023    Past Surgical History:  Procedure Laterality Date   ABDOMINAL HYSTERECTOMY     BIOPSY  05/16/2023   Procedure: BIOPSY;  Surgeon: Meryl Dare, MD;  Location: [facility];  Service: Gastroenterology;;   CHOLECYSTECTOMY     ENDOSCOPIC RETROGRADE CHOLANGIOPANCREATOGRAPHY (ERCP) WITH  PROPOFOL N/A 06/15/2017   Procedure: ENDOSCOPIC RETROGRADE CHOLANGIOPANCREATOGRAPHY (ERCP) WITH PROPOFOL;  Surgeon: Rachael Fee, MD;  Location: [facility];  Service: Endoscopy;  Laterality: N/A;   ESOPHAGOGASTRODUODENOSCOPY (EGD) WITH PROPOFOL N/A 03/24/2017   Procedure: ESOPHAGOGASTRODUODENOSCOPY (EGD) WITH PROPOFOL;  Surgeon: Sherrilyn Rist, MD;  Location: [facility];  Service: Gastroenterology;  Laterality: N/A;   ESOPHAGOGASTRODUODENOSCOPY (EGD) WITH PROPOFOL N/A 11/07/2017   Procedure: ESOPHAGOGASTRODUODENOSCOPY (EGD) WITH PROPOFOL;  Surgeon: Rachael Fee, MD;  Location: [facility];  Service: Endoscopy;  Laterality: N/A;   ESOPHAGOGASTRODUODENOSCOPY (EGD) WITH PROPOFOL N/A 05/16/2023   Procedure: ESOPHAGOGASTRODUODENOSCOPY (EGD) WITH PROPOFOL;  Surgeon: Meryl Dare, MD;  Location: [facility];  Service: Gastroenterology;  Laterality: N/A;    ESOPHAGOGASTRODUODENOSCOPY (EGD) WITH PROPOFOL N/A 07/21/2023   Procedure: ESOPHAGOGASTRODUODENOSCOPY (EGD) WITH PROPOFOL;  Surgeon: Lynann Bologna, MD;  Location: [facility]COPY;  Service: Gastroenterology;  Laterality: N/A;   EUS N/A 04/06/2017   Procedure: UPPER ENDOSCOPIC ULTRASOUND (EUS) LINEAR;  Surgeon: Rachael Fee, MD;  Location: [facility];  Service: Endoscopy;  Laterality: N/A;  to evaluate duodenal mass   HEMOSTASIS CLIP PLACEMENT  07/21/2023   Procedure: HEMOSTASIS CLIP PLACEMENT;  Surgeon: Lynann Bologna, MD;  Location: [facility] ENDOSCOPY;  Service: Gastroenterology;;   HEMOSTASIS CONTROL  07/21/2023   Procedure: HEMOSTASIS CONTROL;  Surgeon: Lynann Bologna, MD;  Location: [facility] ENDOSCOPY;  Service: Gastroenterology;;   HERNIA REPAIR     IR ANGIOGRAM VISCERAL SELECTIVE  07/22/2023   IR BILIARY DRAIN PLACEMENT WITH CHOLANGIOGRAM  03/25/2017   IR CONVERT BILIARY DRAIN TO INT EXT BILIARY DRAIN  04/03/2017   IR EMBO ART  VEN HEMORR LYMPH EXTRAV  INC GUIDE ROADMAPPING  07/22/2023   IR US GUIDE VASC ACCESS RIGHT  07/22/2023   POLYPECTOMY  05/16/2023   Procedure: POLYPECTOMY;  Surgeon: Meryl Dare, MD;  Location: [facility];  Service: Gastroenterology;;    Current Medications: Current Meds  Medication Sig   acetaminophen (TYLENOL) 325 MG tablet Take 650 mg by mouth every 6 (six) hours as needed for mild pain (pain score 1-3) or moderate pain (pain score 4-6).   allopurinol (ZYLOPRIM) 300 MG tablet Take 1 tablet (300 mg total) by mouth daily.   amiodarone (PACERONE) 200 MG tablet Take 1 tablet (200 mg total) by mouth daily.   Ascorbic Acid (VITAMIN C CR) 500 MG TBCR Take 500 mg by mouth daily.   atorvastatin (LIPITOR) 20 MG tablet Take 1 tablet (20 mg total) by mouth daily.   busPIRone (BUSPAR) 10 MG tablet TAKE 1 TABLET BY MOUTH TWICE A DAY   cholecalciferol (VITAMIN D) 1000 units tablet Take 1,000 Units by mouth daily.   cyanocobalamin (VITAMIN B12) 1000 MCG tablet Take 1 tablet by mouth  daily.   ferrous sulfate 325 (65 FE) MG tablet Take 325 mg by mouth daily with breakfast.   folic acid (FOLVITE) 1 MG tablet Take 1 tablet (1 mg total) by mouth daily.   KLOR-CON M20 20 MEQ tablet Take 20 mEq by mouth daily.   levothyroxine (SYNTHROID) 100 MCG tablet Take 1 tablet (100 mcg total) by mouth daily before breakfast.   melatonin 3 MG TABS tablet Take 1 tablet (3 mg total) by mouth at bedtime as needed (insomnia).   methocarbamol (ROBAXIN) 500 MG tablet Take 1 tablet (500 mg total) by mouth every 6 (six) hours as needed for muscle spasms.   ondansetron (ZOFRAN) 4 MG tablet Take 4 mg by mouth every 6 (six) hours as needed for nausea or vomiting.  oxyCODONE (OXY IR/ROXICODONE) 5 MG immediate release tablet Take 5 mg by mouth as needed for severe pain (pain score 7-10).   pantoprazole (PROTONIX) 40 MG tablet Take 1 tablet (40 mg total) by mouth daily.   sertraline (ZOLOFT) 25 MG tablet TAKE 1 TABLET (25 MG TOTAL) BY MOUTH DAILY.   torsemide (DEMADEX) 20 MG tablet Take Torsemide on alternating days40 mg one day (2 tablets) then the next day 20 mg (1 tablet).   [DISCONTINUED] torsemide (DEMADEX) 20 MG tablet Take 1 tablet (20 mg total) by mouth daily.     Allergies:   Codeine and Tape   Social History   Socioeconomic History   Marital status: Divorced    Spouse name: Not on file   Number of children: 2   Years of education: Not on file   Highest education level: Not on file  Occupational History   Not on file  Tobacco Use   Smoking status: Former    Types: Cigarettes    Passive exposure: Past   Smokeless tobacco: Former   Tobacco comments:    Quit in 2012. Started at 20's. Cannot recall how many.   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  Other Topics Concern   Not on file  Social History Narrative   Not on file   Social Drivers of  Health   Financial Resource Strain: Low Risk  (12/02/2022)   Received from [facility]   Overall Financial Resource Strain (CARDIA)    Difficulty of Paying Living Expenses: Not very hard  Food Insecurity: No Food Insecurity (07/20/2023)   Hunger Vital Sign    Worried About Running Out of Food in the Last Year: Never true    Ran Out of Food in the Last Year: Never true  Transportation Needs: No Transportation Needs (07/20/2023)   PRAPARE - Administrator, Civil Service (Medical): No    Lack of Transportation (Non-Medical): No  Physical Activity: Unknown (04/05/2022)   Received from [facility]   Exercise Vital Sign    Days of Exercise per Week: 0 days    Minutes of Exercise per Session: Not on file  Stress: No Stress Concern Present (04/05/2022)   Received from [facility], [facility] Occup[facility] - Occupational Stress Questionnaire    Feeling of Stress : Not at all  Social Connections: Socially Isolated (07/20/2023)   Social Connection and Isolation Panel [NHANES]    Frequency of Communication with Friends and Family: More than three times a week    Frequency of Social Gatherings with Friends and Family: Twice a week    Attends Religious Services: Never    Database administrator or Organizations: No    Attends Engineer, structural: Never    Marital Status: Divorced     Family History: The patient's family history includes Colon cancer in her father; Diabetes in her paternal grandmother; Heart disease in her paternal grandfather; Hypertension in her brother and mother; Prostate cancer in her father; Rheum arthritis in her maternal grandmother. ROS:   Please see the history of present illness.    All 14 point review of systems negative except as described per history of present illness.  EKGs/Labs/Other Studies Reviewed:    The following studies were reviewed  today:   EKG:       Recent  Labs: 07/18/2023: TSH 5.230 07/22/2023: ALT 19 07/31/2023: Magnesium 1.3 08/18/2023: Hemoglobin 9.4; Platelets 104 08/24/2023: NT-Pro BNP 1,402 09/11/2023: BUN 22; Creatinine, Ser 1.17; Potassium 3.9; Sodium 142  Recent Lipid Panel    Component Value Date/Time   CHOL 127 07/18/2023 1542   TRIG 122 07/18/2023 1542   HDL 25 (L) 07/18/2023 1542   CHOLHDL 5.1 (H) 07/18/2023 1542   LDLCALC 80 07/18/2023 1542    Physical Exam:    VS:  BP (!) 150/84 (BP Location: Right Arm, Patient Position: Sitting, Cuff Size: Large)   Pulse 66   Ht 5\' 5"  (1.651 m)   Wt 200 lb (90.7 kg)   SpO2 95%   BMI 33.28 kg/m     Wt Readings from Last 3 Encounters:  09/21/23 200 lb (90.7 kg)  08/24/23 195 lb (88.5 kg)  08/14/23 200 lb 3.2 oz (90.8 kg)     GENERAL:  Well nourished, well developed in no acute distress NECK: No JVD; No carotid bruits CARDIAC: RRR, S1 and S2 present, no murmurs, no rubs, no gallops CHEST:  Clear to auscultation without rales, wheezing or rhonchi  Extremities: 1+ bilateral pitting pedal edema extending slightly above the ankles.. Pulses bilaterally symmetric with radial 2+ and dorsalis pedis 2+ NEUROLOGIC:  Alert and oriented x 3  Medication Adjustments/Labs and Tests Ordered: Current medicines are reviewed at length with the patient today.  Concerns regarding medicines are outlined above.  Orders Placed This Encounter  Procedures   EKG 12-Lead   Meds ordered this encounter  Medications   torsemide (DEMADEX) 20 MG tablet    Sig: Take Torsemide on alternating days40 mg one day (2 tablets) then the next day 20 mg (1 tablet).    Dispense:  180 tablet    Refill:  3    Signed, Dylann Gallier reddy Johnpatrick Jenny, MD, MPH, [facility]. 09/21/2023 4:40 PM    [facility]

## 2023-09-21 NOTE — Patient Instructions (Signed)
 Medication Instructions:  Your physician has recommended you make the following change in your medication:   Take Torsemide 40 mg daily for 7 days, then take Torsemide on alternating days. 40 mg (2 tablets) on one day then the next day 20 mg (1 tablet)  *If you need a refill on your cardiac medications before your next appointment, please call your pharmacy*  Lab Work: None If you have labs (blood work) drawn today and your tests are completely normal, you will receive your results only by: MyChart Message (if you have MyChart) OR A paper copy in the mail If you have any lab test that is abnormal or we need to change your treatment, we will call you to review the results.  Testing/Procedures: None  Follow-Up: At [facility], you and your health needs are our priority.  As part of our continuing mission to provide you with exceptional heart care, our providers are all part of one team.  This team includes your primary Cardiologist (physician) and Advanced Practice Providers or APPs (Physician Assistants and Nurse Practitioners) who all work together to provide you with the care you need, when you need it.  Your next appointment:   2 month(s)  Provider:   Huntley Dec, MD    We recommend signing up for the patient portal called "MyChart".  Sign up information is provided on this After Visit Summary.  MyChart is used to connect with patients for Virtual Visits (Telemedicine).  Patients are able to view lab/test results, encounter notes, upcoming appointments, etc.  Non-urgent messages can be sent to your provider as well.   To learn more about what you can do with MyChart, go to [email or web address].   Other Instructions None

## 2023-09-21 NOTE — Assessment & Plan Note (Signed)
" >>  ASSESSMENT AND PLAN FOR PAROXYSMAL ATRIAL FIBRILLATION (HCC) WRITTEN ON 09/21/2023  4:40 PM BY Symon Norwood, Peggy SAUNDERS, MD  On physical exam appears to show regular rate and rhythm. On rhythm control with amiodarone .  Continue 200 mg once daily. On anticoagulation with Eliquis  5 mg twice daily. Hold Eliquis  as needed for her upcoming GI procedures.  "

## 2023-09-21 NOTE — Assessment & Plan Note (Signed)
 Appears compensated.  Weight 200 pounds by our scale. Weight at home 197 pounds today. Ideal weight appears to be close to 193 pounds. Bilateral lower extremity pedal edema observed.  Continue with salt restriction to below 2 g/day, her daughter prepares her meals. Keep fluid intake to below 2 L/day. Continue torsemide, dose increased to 40 mg once daily in the morning for the next 7 days and after that to alternate 40 mg and 20 mg once a day dose.  Advised to continue monitoring daily weights. Advised her to keep her feet elevated when seated or reclined in order to facilitate venous drainage and decreasing pedal edema.  And once her pedal edema resolves she should have compression socks on.  Renal function limits use of Entresto. Limited mobility and high risk for UTI and perineal infections limits use of SGLT2 inhibitors. Her blood pressures at home have been relatively well-controlled. At subsequent follow-up will review her blood pressure logs and will consider adding hydralazine/Isordil.

## 2023-09-22 DIAGNOSIS — E876 Hypokalemia: Secondary | ICD-10-CM | POA: Diagnosis not present

## 2023-09-22 DIAGNOSIS — I2510 Atherosclerotic heart disease of native coronary artery without angina pectoris: Secondary | ICD-10-CM | POA: Diagnosis not present

## 2023-09-22 DIAGNOSIS — D62 Acute posthemorrhagic anemia: Secondary | ICD-10-CM | POA: Diagnosis not present

## 2023-09-22 DIAGNOSIS — S3214XD Type 1 fracture of sacrum, subsequent encounter for fracture with routine healing: Secondary | ICD-10-CM | POA: Diagnosis not present

## 2023-09-22 DIAGNOSIS — I482 Chronic atrial fibrillation, unspecified: Secondary | ICD-10-CM | POA: Diagnosis not present

## 2023-09-22 DIAGNOSIS — K219 Gastro-esophageal reflux disease without esophagitis: Secondary | ICD-10-CM | POA: Diagnosis not present

## 2023-09-22 DIAGNOSIS — I4892 Unspecified atrial flutter: Secondary | ICD-10-CM | POA: Diagnosis not present

## 2023-09-22 DIAGNOSIS — I5032 Chronic diastolic (congestive) heart failure: Secondary | ICD-10-CM | POA: Diagnosis not present

## 2023-09-22 DIAGNOSIS — M47817 Spondylosis without myelopathy or radiculopathy, lumbosacral region: Secondary | ICD-10-CM | POA: Diagnosis not present

## 2023-09-22 DIAGNOSIS — M51369 Other intervertebral disc degeneration, lumbar region without mention of lumbar back pain or lower extremity pain: Secondary | ICD-10-CM | POA: Diagnosis not present

## 2023-09-22 DIAGNOSIS — G8929 Other chronic pain: Secondary | ICD-10-CM | POA: Diagnosis not present

## 2023-09-22 DIAGNOSIS — E871 Hypo-osmolality and hyponatremia: Secondary | ICD-10-CM | POA: Diagnosis not present

## 2023-09-22 DIAGNOSIS — I130 Hypertensive heart and chronic kidney disease with heart failure and stage 1 through stage 4 chronic kidney disease, or unspecified chronic kidney disease: Secondary | ICD-10-CM | POA: Diagnosis not present

## 2023-09-22 DIAGNOSIS — S2241XD Multiple fractures of ribs, right side, subsequent encounter for fracture with routine healing: Secondary | ICD-10-CM | POA: Diagnosis not present

## 2023-09-22 DIAGNOSIS — N1832 Chronic kidney disease, stage 3b: Secondary | ICD-10-CM | POA: Diagnosis not present

## 2023-09-22 DIAGNOSIS — K922 Gastrointestinal hemorrhage, unspecified: Secondary | ICD-10-CM | POA: Diagnosis not present

## 2023-09-22 DIAGNOSIS — S32021D Stable burst fracture of second lumbar vertebra, subsequent encounter for fracture with routine healing: Secondary | ICD-10-CM | POA: Diagnosis not present

## 2023-09-22 DIAGNOSIS — M109 Gout, unspecified: Secondary | ICD-10-CM | POA: Diagnosis not present

## 2023-09-22 DIAGNOSIS — D509 Iron deficiency anemia, unspecified: Secondary | ICD-10-CM | POA: Diagnosis not present

## 2023-09-22 DIAGNOSIS — M069 Rheumatoid arthritis, unspecified: Secondary | ICD-10-CM | POA: Diagnosis not present

## 2023-09-22 DIAGNOSIS — E039 Hypothyroidism, unspecified: Secondary | ICD-10-CM | POA: Diagnosis not present

## 2023-09-25 DIAGNOSIS — G8929 Other chronic pain: Secondary | ICD-10-CM | POA: Diagnosis not present

## 2023-09-25 DIAGNOSIS — M47817 Spondylosis without myelopathy or radiculopathy, lumbosacral region: Secondary | ICD-10-CM | POA: Diagnosis not present

## 2023-09-25 DIAGNOSIS — S32021D Stable burst fracture of second lumbar vertebra, subsequent encounter for fracture with routine healing: Secondary | ICD-10-CM | POA: Diagnosis not present

## 2023-09-25 DIAGNOSIS — S3214XD Type 1 fracture of sacrum, subsequent encounter for fracture with routine healing: Secondary | ICD-10-CM | POA: Diagnosis not present

## 2023-09-25 DIAGNOSIS — D62 Acute posthemorrhagic anemia: Secondary | ICD-10-CM | POA: Diagnosis not present

## 2023-09-25 DIAGNOSIS — I482 Chronic atrial fibrillation, unspecified: Secondary | ICD-10-CM | POA: Diagnosis not present

## 2023-09-25 DIAGNOSIS — E039 Hypothyroidism, unspecified: Secondary | ICD-10-CM | POA: Diagnosis not present

## 2023-09-25 DIAGNOSIS — S2241XD Multiple fractures of ribs, right side, subsequent encounter for fracture with routine healing: Secondary | ICD-10-CM | POA: Diagnosis not present

## 2023-09-25 DIAGNOSIS — N1832 Chronic kidney disease, stage 3b: Secondary | ICD-10-CM | POA: Diagnosis not present

## 2023-09-25 DIAGNOSIS — M109 Gout, unspecified: Secondary | ICD-10-CM | POA: Diagnosis not present

## 2023-09-25 DIAGNOSIS — K922 Gastrointestinal hemorrhage, unspecified: Secondary | ICD-10-CM | POA: Diagnosis not present

## 2023-09-25 DIAGNOSIS — I5032 Chronic diastolic (congestive) heart failure: Secondary | ICD-10-CM | POA: Diagnosis not present

## 2023-09-25 DIAGNOSIS — E871 Hypo-osmolality and hyponatremia: Secondary | ICD-10-CM | POA: Diagnosis not present

## 2023-09-25 DIAGNOSIS — I130 Hypertensive heart and chronic kidney disease with heart failure and stage 1 through stage 4 chronic kidney disease, or unspecified chronic kidney disease: Secondary | ICD-10-CM | POA: Diagnosis not present

## 2023-09-25 DIAGNOSIS — K219 Gastro-esophageal reflux disease without esophagitis: Secondary | ICD-10-CM | POA: Diagnosis not present

## 2023-09-25 DIAGNOSIS — D509 Iron deficiency anemia, unspecified: Secondary | ICD-10-CM | POA: Diagnosis not present

## 2023-09-25 DIAGNOSIS — I2510 Atherosclerotic heart disease of native coronary artery without angina pectoris: Secondary | ICD-10-CM | POA: Diagnosis not present

## 2023-09-25 DIAGNOSIS — M069 Rheumatoid arthritis, unspecified: Secondary | ICD-10-CM | POA: Diagnosis not present

## 2023-09-25 DIAGNOSIS — E876 Hypokalemia: Secondary | ICD-10-CM | POA: Diagnosis not present

## 2023-09-25 DIAGNOSIS — M51369 Other intervertebral disc degeneration, lumbar region without mention of lumbar back pain or lower extremity pain: Secondary | ICD-10-CM | POA: Diagnosis not present

## 2023-09-25 DIAGNOSIS — I4892 Unspecified atrial flutter: Secondary | ICD-10-CM | POA: Diagnosis not present

## 2023-09-26 ENCOUNTER — Ambulatory Visit (HOSPITAL_BASED_OUTPATIENT_CLINIC_OR_DEPARTMENT_OTHER): Payer: 59 | Admitting: Student

## 2023-09-26 DIAGNOSIS — I482 Chronic atrial fibrillation, unspecified: Secondary | ICD-10-CM | POA: Diagnosis not present

## 2023-09-26 DIAGNOSIS — G8929 Other chronic pain: Secondary | ICD-10-CM | POA: Diagnosis not present

## 2023-09-26 DIAGNOSIS — M51369 Other intervertebral disc degeneration, lumbar region without mention of lumbar back pain or lower extremity pain: Secondary | ICD-10-CM | POA: Diagnosis not present

## 2023-09-26 DIAGNOSIS — E876 Hypokalemia: Secondary | ICD-10-CM | POA: Diagnosis not present

## 2023-09-26 DIAGNOSIS — N1832 Chronic kidney disease, stage 3b: Secondary | ICD-10-CM | POA: Diagnosis not present

## 2023-09-26 DIAGNOSIS — E871 Hypo-osmolality and hyponatremia: Secondary | ICD-10-CM | POA: Diagnosis not present

## 2023-09-26 DIAGNOSIS — I5032 Chronic diastolic (congestive) heart failure: Secondary | ICD-10-CM | POA: Diagnosis not present

## 2023-09-26 DIAGNOSIS — D509 Iron deficiency anemia, unspecified: Secondary | ICD-10-CM | POA: Diagnosis not present

## 2023-09-26 DIAGNOSIS — K219 Gastro-esophageal reflux disease without esophagitis: Secondary | ICD-10-CM | POA: Diagnosis not present

## 2023-09-26 DIAGNOSIS — D62 Acute posthemorrhagic anemia: Secondary | ICD-10-CM | POA: Diagnosis not present

## 2023-09-26 DIAGNOSIS — M109 Gout, unspecified: Secondary | ICD-10-CM | POA: Diagnosis not present

## 2023-09-26 DIAGNOSIS — S3214XD Type 1 fracture of sacrum, subsequent encounter for fracture with routine healing: Secondary | ICD-10-CM | POA: Diagnosis not present

## 2023-09-26 DIAGNOSIS — S32021D Stable burst fracture of second lumbar vertebra, subsequent encounter for fracture with routine healing: Secondary | ICD-10-CM | POA: Diagnosis not present

## 2023-09-26 DIAGNOSIS — M069 Rheumatoid arthritis, unspecified: Secondary | ICD-10-CM | POA: Diagnosis not present

## 2023-09-26 DIAGNOSIS — S2241XD Multiple fractures of ribs, right side, subsequent encounter for fracture with routine healing: Secondary | ICD-10-CM | POA: Diagnosis not present

## 2023-09-26 DIAGNOSIS — I4892 Unspecified atrial flutter: Secondary | ICD-10-CM | POA: Diagnosis not present

## 2023-09-26 DIAGNOSIS — E039 Hypothyroidism, unspecified: Secondary | ICD-10-CM | POA: Diagnosis not present

## 2023-09-26 DIAGNOSIS — I130 Hypertensive heart and chronic kidney disease with heart failure and stage 1 through stage 4 chronic kidney disease, or unspecified chronic kidney disease: Secondary | ICD-10-CM | POA: Diagnosis not present

## 2023-09-26 DIAGNOSIS — K922 Gastrointestinal hemorrhage, unspecified: Secondary | ICD-10-CM | POA: Diagnosis not present

## 2023-09-26 DIAGNOSIS — M47817 Spondylosis without myelopathy or radiculopathy, lumbosacral region: Secondary | ICD-10-CM | POA: Diagnosis not present

## 2023-09-26 DIAGNOSIS — I2510 Atherosclerotic heart disease of native coronary artery without angina pectoris: Secondary | ICD-10-CM | POA: Diagnosis not present

## 2023-09-27 ENCOUNTER — Telehealth (HOSPITAL_BASED_OUTPATIENT_CLINIC_OR_DEPARTMENT_OTHER): Payer: Self-pay | Admitting: Student

## 2023-09-27 DIAGNOSIS — N1832 Chronic kidney disease, stage 3b: Secondary | ICD-10-CM | POA: Diagnosis not present

## 2023-09-27 DIAGNOSIS — I482 Chronic atrial fibrillation, unspecified: Secondary | ICD-10-CM | POA: Diagnosis not present

## 2023-09-27 DIAGNOSIS — E871 Hypo-osmolality and hyponatremia: Secondary | ICD-10-CM | POA: Diagnosis not present

## 2023-09-27 DIAGNOSIS — E876 Hypokalemia: Secondary | ICD-10-CM | POA: Diagnosis not present

## 2023-09-27 DIAGNOSIS — G8929 Other chronic pain: Secondary | ICD-10-CM | POA: Diagnosis not present

## 2023-09-27 DIAGNOSIS — K219 Gastro-esophageal reflux disease without esophagitis: Secondary | ICD-10-CM | POA: Diagnosis not present

## 2023-09-27 DIAGNOSIS — E039 Hypothyroidism, unspecified: Secondary | ICD-10-CM | POA: Diagnosis not present

## 2023-09-27 DIAGNOSIS — D509 Iron deficiency anemia, unspecified: Secondary | ICD-10-CM | POA: Diagnosis not present

## 2023-09-27 DIAGNOSIS — M47817 Spondylosis without myelopathy or radiculopathy, lumbosacral region: Secondary | ICD-10-CM | POA: Diagnosis not present

## 2023-09-27 DIAGNOSIS — M109 Gout, unspecified: Secondary | ICD-10-CM | POA: Diagnosis not present

## 2023-09-27 DIAGNOSIS — M51369 Other intervertebral disc degeneration, lumbar region without mention of lumbar back pain or lower extremity pain: Secondary | ICD-10-CM | POA: Diagnosis not present

## 2023-09-27 DIAGNOSIS — I2510 Atherosclerotic heart disease of native coronary artery without angina pectoris: Secondary | ICD-10-CM | POA: Diagnosis not present

## 2023-09-27 DIAGNOSIS — I4892 Unspecified atrial flutter: Secondary | ICD-10-CM | POA: Diagnosis not present

## 2023-09-27 DIAGNOSIS — I5032 Chronic diastolic (congestive) heart failure: Secondary | ICD-10-CM | POA: Diagnosis not present

## 2023-09-27 DIAGNOSIS — M069 Rheumatoid arthritis, unspecified: Secondary | ICD-10-CM | POA: Diagnosis not present

## 2023-09-27 DIAGNOSIS — K922 Gastrointestinal hemorrhage, unspecified: Secondary | ICD-10-CM | POA: Diagnosis not present

## 2023-09-27 DIAGNOSIS — S2241XD Multiple fractures of ribs, right side, subsequent encounter for fracture with routine healing: Secondary | ICD-10-CM | POA: Diagnosis not present

## 2023-09-27 DIAGNOSIS — D62 Acute posthemorrhagic anemia: Secondary | ICD-10-CM | POA: Diagnosis not present

## 2023-09-27 DIAGNOSIS — I130 Hypertensive heart and chronic kidney disease with heart failure and stage 1 through stage 4 chronic kidney disease, or unspecified chronic kidney disease: Secondary | ICD-10-CM | POA: Diagnosis not present

## 2023-09-27 DIAGNOSIS — S3214XD Type 1 fracture of sacrum, subsequent encounter for fracture with routine healing: Secondary | ICD-10-CM | POA: Diagnosis not present

## 2023-09-27 DIAGNOSIS — S32021D Stable burst fracture of second lumbar vertebra, subsequent encounter for fracture with routine healing: Secondary | ICD-10-CM | POA: Diagnosis not present

## 2023-09-27 NOTE — Telephone Encounter (Signed)
 Copied from CRM [id number]. Topic: Clinical - Medication Question >> Sep 27, 2023  1:11 PM Carlatta H wrote: Reason for CRM: The patient would like a call back regarding medications that were recommended to the patient by her therapist//Please call patient//

## 2023-09-28 DIAGNOSIS — I4892 Unspecified atrial flutter: Secondary | ICD-10-CM | POA: Diagnosis not present

## 2023-09-28 DIAGNOSIS — E876 Hypokalemia: Secondary | ICD-10-CM | POA: Diagnosis not present

## 2023-09-28 DIAGNOSIS — M51369 Other intervertebral disc degeneration, lumbar region without mention of lumbar back pain or lower extremity pain: Secondary | ICD-10-CM | POA: Diagnosis not present

## 2023-09-28 DIAGNOSIS — M47817 Spondylosis without myelopathy or radiculopathy, lumbosacral region: Secondary | ICD-10-CM | POA: Diagnosis not present

## 2023-09-28 DIAGNOSIS — D509 Iron deficiency anemia, unspecified: Secondary | ICD-10-CM | POA: Diagnosis not present

## 2023-09-28 DIAGNOSIS — K922 Gastrointestinal hemorrhage, unspecified: Secondary | ICD-10-CM | POA: Diagnosis not present

## 2023-09-28 DIAGNOSIS — K219 Gastro-esophageal reflux disease without esophagitis: Secondary | ICD-10-CM | POA: Diagnosis not present

## 2023-09-28 DIAGNOSIS — I5032 Chronic diastolic (congestive) heart failure: Secondary | ICD-10-CM | POA: Diagnosis not present

## 2023-09-28 DIAGNOSIS — M109 Gout, unspecified: Secondary | ICD-10-CM | POA: Diagnosis not present

## 2023-09-28 DIAGNOSIS — E871 Hypo-osmolality and hyponatremia: Secondary | ICD-10-CM | POA: Diagnosis not present

## 2023-09-28 DIAGNOSIS — M069 Rheumatoid arthritis, unspecified: Secondary | ICD-10-CM | POA: Diagnosis not present

## 2023-09-28 DIAGNOSIS — E039 Hypothyroidism, unspecified: Secondary | ICD-10-CM | POA: Diagnosis not present

## 2023-09-28 DIAGNOSIS — I130 Hypertensive heart and chronic kidney disease with heart failure and stage 1 through stage 4 chronic kidney disease, or unspecified chronic kidney disease: Secondary | ICD-10-CM | POA: Diagnosis not present

## 2023-09-28 DIAGNOSIS — N1832 Chronic kidney disease, stage 3b: Secondary | ICD-10-CM | POA: Diagnosis not present

## 2023-09-28 DIAGNOSIS — I482 Chronic atrial fibrillation, unspecified: Secondary | ICD-10-CM | POA: Diagnosis not present

## 2023-09-28 DIAGNOSIS — G8929 Other chronic pain: Secondary | ICD-10-CM | POA: Diagnosis not present

## 2023-09-28 DIAGNOSIS — S2241XD Multiple fractures of ribs, right side, subsequent encounter for fracture with routine healing: Secondary | ICD-10-CM | POA: Diagnosis not present

## 2023-09-28 DIAGNOSIS — I2510 Atherosclerotic heart disease of native coronary artery without angina pectoris: Secondary | ICD-10-CM | POA: Diagnosis not present

## 2023-09-28 DIAGNOSIS — D62 Acute posthemorrhagic anemia: Secondary | ICD-10-CM | POA: Diagnosis not present

## 2023-09-28 DIAGNOSIS — S3214XD Type 1 fracture of sacrum, subsequent encounter for fracture with routine healing: Secondary | ICD-10-CM | POA: Diagnosis not present

## 2023-09-28 DIAGNOSIS — S32021D Stable burst fracture of second lumbar vertebra, subsequent encounter for fracture with routine healing: Secondary | ICD-10-CM | POA: Diagnosis not present

## 2023-09-29 ENCOUNTER — Other Ambulatory Visit (HOSPITAL_BASED_OUTPATIENT_CLINIC_OR_DEPARTMENT_OTHER): Payer: Self-pay | Admitting: Student

## 2023-09-29 ENCOUNTER — Encounter (HOSPITAL_BASED_OUTPATIENT_CLINIC_OR_DEPARTMENT_OTHER): Payer: Self-pay | Admitting: Student

## 2023-09-29 ENCOUNTER — Ambulatory Visit: Payer: 59

## 2023-09-29 DIAGNOSIS — M545 Low back pain, unspecified: Secondary | ICD-10-CM

## 2023-09-29 MED ORDER — DULOXETINE HCL 20 MG PO CPEP: 20.0000 mg | ORAL_CAPSULE | Freq: Every day | ORAL | 3 refills | Status: DC

## 2023-10-02 DIAGNOSIS — S32021D Stable burst fracture of second lumbar vertebra, subsequent encounter for fracture with routine healing: Secondary | ICD-10-CM | POA: Diagnosis not present

## 2023-10-02 DIAGNOSIS — M109 Gout, unspecified: Secondary | ICD-10-CM | POA: Diagnosis not present

## 2023-10-02 DIAGNOSIS — I482 Chronic atrial fibrillation, unspecified: Secondary | ICD-10-CM | POA: Diagnosis not present

## 2023-10-02 DIAGNOSIS — S3214XD Type 1 fracture of sacrum, subsequent encounter for fracture with routine healing: Secondary | ICD-10-CM | POA: Diagnosis not present

## 2023-10-02 DIAGNOSIS — I5032 Chronic diastolic (congestive) heart failure: Secondary | ICD-10-CM | POA: Diagnosis not present

## 2023-10-02 DIAGNOSIS — E871 Hypo-osmolality and hyponatremia: Secondary | ICD-10-CM | POA: Diagnosis not present

## 2023-10-02 DIAGNOSIS — N1832 Chronic kidney disease, stage 3b: Secondary | ICD-10-CM | POA: Diagnosis not present

## 2023-10-02 DIAGNOSIS — M069 Rheumatoid arthritis, unspecified: Secondary | ICD-10-CM | POA: Diagnosis not present

## 2023-10-02 DIAGNOSIS — E039 Hypothyroidism, unspecified: Secondary | ICD-10-CM | POA: Diagnosis not present

## 2023-10-02 DIAGNOSIS — G8929 Other chronic pain: Secondary | ICD-10-CM | POA: Diagnosis not present

## 2023-10-02 DIAGNOSIS — K922 Gastrointestinal hemorrhage, unspecified: Secondary | ICD-10-CM | POA: Diagnosis not present

## 2023-10-02 DIAGNOSIS — I130 Hypertensive heart and chronic kidney disease with heart failure and stage 1 through stage 4 chronic kidney disease, or unspecified chronic kidney disease: Secondary | ICD-10-CM | POA: Diagnosis not present

## 2023-10-02 DIAGNOSIS — D62 Acute posthemorrhagic anemia: Secondary | ICD-10-CM | POA: Diagnosis not present

## 2023-10-02 DIAGNOSIS — E876 Hypokalemia: Secondary | ICD-10-CM | POA: Diagnosis not present

## 2023-10-02 DIAGNOSIS — S2241XD Multiple fractures of ribs, right side, subsequent encounter for fracture with routine healing: Secondary | ICD-10-CM | POA: Diagnosis not present

## 2023-10-02 DIAGNOSIS — M47817 Spondylosis without myelopathy or radiculopathy, lumbosacral region: Secondary | ICD-10-CM | POA: Diagnosis not present

## 2023-10-02 DIAGNOSIS — M51369 Other intervertebral disc degeneration, lumbar region without mention of lumbar back pain or lower extremity pain: Secondary | ICD-10-CM | POA: Diagnosis not present

## 2023-10-02 DIAGNOSIS — I4892 Unspecified atrial flutter: Secondary | ICD-10-CM | POA: Diagnosis not present

## 2023-10-02 DIAGNOSIS — I2510 Atherosclerotic heart disease of native coronary artery without angina pectoris: Secondary | ICD-10-CM | POA: Diagnosis not present

## 2023-10-02 DIAGNOSIS — K219 Gastro-esophageal reflux disease without esophagitis: Secondary | ICD-10-CM | POA: Diagnosis not present

## 2023-10-02 DIAGNOSIS — D509 Iron deficiency anemia, unspecified: Secondary | ICD-10-CM | POA: Diagnosis not present

## 2023-10-03 DIAGNOSIS — I482 Chronic atrial fibrillation, unspecified: Secondary | ICD-10-CM | POA: Diagnosis not present

## 2023-10-03 DIAGNOSIS — D62 Acute posthemorrhagic anemia: Secondary | ICD-10-CM | POA: Diagnosis not present

## 2023-10-03 DIAGNOSIS — E876 Hypokalemia: Secondary | ICD-10-CM | POA: Diagnosis not present

## 2023-10-03 DIAGNOSIS — M47817 Spondylosis without myelopathy or radiculopathy, lumbosacral region: Secondary | ICD-10-CM | POA: Diagnosis not present

## 2023-10-03 DIAGNOSIS — G8929 Other chronic pain: Secondary | ICD-10-CM | POA: Diagnosis not present

## 2023-10-03 DIAGNOSIS — M51369 Other intervertebral disc degeneration, lumbar region without mention of lumbar back pain or lower extremity pain: Secondary | ICD-10-CM | POA: Diagnosis not present

## 2023-10-03 DIAGNOSIS — I130 Hypertensive heart and chronic kidney disease with heart failure and stage 1 through stage 4 chronic kidney disease, or unspecified chronic kidney disease: Secondary | ICD-10-CM | POA: Diagnosis not present

## 2023-10-03 DIAGNOSIS — S3214XD Type 1 fracture of sacrum, subsequent encounter for fracture with routine healing: Secondary | ICD-10-CM | POA: Diagnosis not present

## 2023-10-03 DIAGNOSIS — I2510 Atherosclerotic heart disease of native coronary artery without angina pectoris: Secondary | ICD-10-CM | POA: Diagnosis not present

## 2023-10-03 DIAGNOSIS — I4892 Unspecified atrial flutter: Secondary | ICD-10-CM | POA: Diagnosis not present

## 2023-10-03 DIAGNOSIS — M069 Rheumatoid arthritis, unspecified: Secondary | ICD-10-CM | POA: Diagnosis not present

## 2023-10-03 DIAGNOSIS — K922 Gastrointestinal hemorrhage, unspecified: Secondary | ICD-10-CM | POA: Diagnosis not present

## 2023-10-03 DIAGNOSIS — M109 Gout, unspecified: Secondary | ICD-10-CM | POA: Diagnosis not present

## 2023-10-03 DIAGNOSIS — I5032 Chronic diastolic (congestive) heart failure: Secondary | ICD-10-CM | POA: Diagnosis not present

## 2023-10-03 DIAGNOSIS — S2241XD Multiple fractures of ribs, right side, subsequent encounter for fracture with routine healing: Secondary | ICD-10-CM | POA: Diagnosis not present

## 2023-10-03 DIAGNOSIS — D509 Iron deficiency anemia, unspecified: Secondary | ICD-10-CM | POA: Diagnosis not present

## 2023-10-03 DIAGNOSIS — E871 Hypo-osmolality and hyponatremia: Secondary | ICD-10-CM | POA: Diagnosis not present

## 2023-10-03 DIAGNOSIS — K219 Gastro-esophageal reflux disease without esophagitis: Secondary | ICD-10-CM | POA: Diagnosis not present

## 2023-10-03 DIAGNOSIS — S32021D Stable burst fracture of second lumbar vertebra, subsequent encounter for fracture with routine healing: Secondary | ICD-10-CM | POA: Diagnosis not present

## 2023-10-03 DIAGNOSIS — N1832 Chronic kidney disease, stage 3b: Secondary | ICD-10-CM | POA: Diagnosis not present

## 2023-10-03 DIAGNOSIS — E039 Hypothyroidism, unspecified: Secondary | ICD-10-CM | POA: Diagnosis not present

## 2023-10-04 DIAGNOSIS — K922 Gastrointestinal hemorrhage, unspecified: Secondary | ICD-10-CM | POA: Diagnosis not present

## 2023-10-04 DIAGNOSIS — I2510 Atherosclerotic heart disease of native coronary artery without angina pectoris: Secondary | ICD-10-CM | POA: Diagnosis not present

## 2023-10-04 DIAGNOSIS — E876 Hypokalemia: Secondary | ICD-10-CM | POA: Diagnosis not present

## 2023-10-04 DIAGNOSIS — S2241XD Multiple fractures of ribs, right side, subsequent encounter for fracture with routine healing: Secondary | ICD-10-CM | POA: Diagnosis not present

## 2023-10-04 DIAGNOSIS — S32021D Stable burst fracture of second lumbar vertebra, subsequent encounter for fracture with routine healing: Secondary | ICD-10-CM | POA: Diagnosis not present

## 2023-10-04 DIAGNOSIS — M47817 Spondylosis without myelopathy or radiculopathy, lumbosacral region: Secondary | ICD-10-CM | POA: Diagnosis not present

## 2023-10-04 DIAGNOSIS — G8929 Other chronic pain: Secondary | ICD-10-CM | POA: Diagnosis not present

## 2023-10-04 DIAGNOSIS — N1832 Chronic kidney disease, stage 3b: Secondary | ICD-10-CM | POA: Diagnosis not present

## 2023-10-04 DIAGNOSIS — M109 Gout, unspecified: Secondary | ICD-10-CM | POA: Diagnosis not present

## 2023-10-04 DIAGNOSIS — I482 Chronic atrial fibrillation, unspecified: Secondary | ICD-10-CM | POA: Diagnosis not present

## 2023-10-04 DIAGNOSIS — I4892 Unspecified atrial flutter: Secondary | ICD-10-CM | POA: Diagnosis not present

## 2023-10-04 DIAGNOSIS — I5032 Chronic diastolic (congestive) heart failure: Secondary | ICD-10-CM | POA: Diagnosis not present

## 2023-10-04 DIAGNOSIS — E871 Hypo-osmolality and hyponatremia: Secondary | ICD-10-CM | POA: Diagnosis not present

## 2023-10-04 DIAGNOSIS — K219 Gastro-esophageal reflux disease without esophagitis: Secondary | ICD-10-CM | POA: Diagnosis not present

## 2023-10-04 DIAGNOSIS — D509 Iron deficiency anemia, unspecified: Secondary | ICD-10-CM | POA: Diagnosis not present

## 2023-10-04 DIAGNOSIS — E039 Hypothyroidism, unspecified: Secondary | ICD-10-CM | POA: Diagnosis not present

## 2023-10-04 DIAGNOSIS — M069 Rheumatoid arthritis, unspecified: Secondary | ICD-10-CM | POA: Diagnosis not present

## 2023-10-04 DIAGNOSIS — S3214XD Type 1 fracture of sacrum, subsequent encounter for fracture with routine healing: Secondary | ICD-10-CM | POA: Diagnosis not present

## 2023-10-04 DIAGNOSIS — I130 Hypertensive heart and chronic kidney disease with heart failure and stage 1 through stage 4 chronic kidney disease, or unspecified chronic kidney disease: Secondary | ICD-10-CM | POA: Diagnosis not present

## 2023-10-04 DIAGNOSIS — M51369 Other intervertebral disc degeneration, lumbar region without mention of lumbar back pain or lower extremity pain: Secondary | ICD-10-CM | POA: Diagnosis not present

## 2023-10-04 DIAGNOSIS — D62 Acute posthemorrhagic anemia: Secondary | ICD-10-CM | POA: Diagnosis not present

## 2023-10-05 DIAGNOSIS — D509 Iron deficiency anemia, unspecified: Secondary | ICD-10-CM | POA: Diagnosis not present

## 2023-10-05 DIAGNOSIS — I4892 Unspecified atrial flutter: Secondary | ICD-10-CM | POA: Diagnosis not present

## 2023-10-05 DIAGNOSIS — K219 Gastro-esophageal reflux disease without esophagitis: Secondary | ICD-10-CM | POA: Diagnosis not present

## 2023-10-05 DIAGNOSIS — K922 Gastrointestinal hemorrhage, unspecified: Secondary | ICD-10-CM | POA: Diagnosis not present

## 2023-10-05 DIAGNOSIS — I130 Hypertensive heart and chronic kidney disease with heart failure and stage 1 through stage 4 chronic kidney disease, or unspecified chronic kidney disease: Secondary | ICD-10-CM | POA: Diagnosis not present

## 2023-10-05 DIAGNOSIS — E039 Hypothyroidism, unspecified: Secondary | ICD-10-CM | POA: Diagnosis not present

## 2023-10-05 DIAGNOSIS — S2241XD Multiple fractures of ribs, right side, subsequent encounter for fracture with routine healing: Secondary | ICD-10-CM | POA: Diagnosis not present

## 2023-10-05 DIAGNOSIS — S32021D Stable burst fracture of second lumbar vertebra, subsequent encounter for fracture with routine healing: Secondary | ICD-10-CM | POA: Diagnosis not present

## 2023-10-05 DIAGNOSIS — I482 Chronic atrial fibrillation, unspecified: Secondary | ICD-10-CM | POA: Diagnosis not present

## 2023-10-05 DIAGNOSIS — I2510 Atherosclerotic heart disease of native coronary artery without angina pectoris: Secondary | ICD-10-CM | POA: Diagnosis not present

## 2023-10-05 DIAGNOSIS — E871 Hypo-osmolality and hyponatremia: Secondary | ICD-10-CM | POA: Diagnosis not present

## 2023-10-05 DIAGNOSIS — I5032 Chronic diastolic (congestive) heart failure: Secondary | ICD-10-CM | POA: Diagnosis not present

## 2023-10-05 DIAGNOSIS — M47817 Spondylosis without myelopathy or radiculopathy, lumbosacral region: Secondary | ICD-10-CM | POA: Diagnosis not present

## 2023-10-05 DIAGNOSIS — M109 Gout, unspecified: Secondary | ICD-10-CM | POA: Diagnosis not present

## 2023-10-05 DIAGNOSIS — M069 Rheumatoid arthritis, unspecified: Secondary | ICD-10-CM | POA: Diagnosis not present

## 2023-10-05 DIAGNOSIS — S3214XD Type 1 fracture of sacrum, subsequent encounter for fracture with routine healing: Secondary | ICD-10-CM | POA: Diagnosis not present

## 2023-10-05 DIAGNOSIS — M51369 Other intervertebral disc degeneration, lumbar region without mention of lumbar back pain or lower extremity pain: Secondary | ICD-10-CM | POA: Diagnosis not present

## 2023-10-05 DIAGNOSIS — N1832 Chronic kidney disease, stage 3b: Secondary | ICD-10-CM | POA: Diagnosis not present

## 2023-10-05 DIAGNOSIS — D62 Acute posthemorrhagic anemia: Secondary | ICD-10-CM | POA: Diagnosis not present

## 2023-10-05 DIAGNOSIS — E876 Hypokalemia: Secondary | ICD-10-CM | POA: Diagnosis not present

## 2023-10-05 DIAGNOSIS — G8929 Other chronic pain: Secondary | ICD-10-CM | POA: Diagnosis not present

## 2023-10-10 DIAGNOSIS — I4892 Unspecified atrial flutter: Secondary | ICD-10-CM | POA: Diagnosis not present

## 2023-10-10 DIAGNOSIS — E871 Hypo-osmolality and hyponatremia: Secondary | ICD-10-CM | POA: Diagnosis not present

## 2023-10-10 DIAGNOSIS — D509 Iron deficiency anemia, unspecified: Secondary | ICD-10-CM | POA: Diagnosis not present

## 2023-10-10 DIAGNOSIS — I5032 Chronic diastolic (congestive) heart failure: Secondary | ICD-10-CM | POA: Diagnosis not present

## 2023-10-10 DIAGNOSIS — D62 Acute posthemorrhagic anemia: Secondary | ICD-10-CM | POA: Diagnosis not present

## 2023-10-10 DIAGNOSIS — S2241XD Multiple fractures of ribs, right side, subsequent encounter for fracture with routine healing: Secondary | ICD-10-CM | POA: Diagnosis not present

## 2023-10-10 DIAGNOSIS — E039 Hypothyroidism, unspecified: Secondary | ICD-10-CM | POA: Diagnosis not present

## 2023-10-10 DIAGNOSIS — M069 Rheumatoid arthritis, unspecified: Secondary | ICD-10-CM | POA: Diagnosis not present

## 2023-10-10 DIAGNOSIS — K922 Gastrointestinal hemorrhage, unspecified: Secondary | ICD-10-CM | POA: Diagnosis not present

## 2023-10-10 DIAGNOSIS — M51369 Other intervertebral disc degeneration, lumbar region without mention of lumbar back pain or lower extremity pain: Secondary | ICD-10-CM | POA: Diagnosis not present

## 2023-10-10 DIAGNOSIS — I2510 Atherosclerotic heart disease of native coronary artery without angina pectoris: Secondary | ICD-10-CM | POA: Diagnosis not present

## 2023-10-10 DIAGNOSIS — G8929 Other chronic pain: Secondary | ICD-10-CM | POA: Diagnosis not present

## 2023-10-10 DIAGNOSIS — M47817 Spondylosis without myelopathy or radiculopathy, lumbosacral region: Secondary | ICD-10-CM | POA: Diagnosis not present

## 2023-10-10 DIAGNOSIS — I130 Hypertensive heart and chronic kidney disease with heart failure and stage 1 through stage 4 chronic kidney disease, or unspecified chronic kidney disease: Secondary | ICD-10-CM | POA: Diagnosis not present

## 2023-10-10 DIAGNOSIS — M109 Gout, unspecified: Secondary | ICD-10-CM | POA: Diagnosis not present

## 2023-10-10 DIAGNOSIS — E876 Hypokalemia: Secondary | ICD-10-CM | POA: Diagnosis not present

## 2023-10-10 DIAGNOSIS — N1832 Chronic kidney disease, stage 3b: Secondary | ICD-10-CM | POA: Diagnosis not present

## 2023-10-10 DIAGNOSIS — I482 Chronic atrial fibrillation, unspecified: Secondary | ICD-10-CM | POA: Diagnosis not present

## 2023-10-10 DIAGNOSIS — S3214XD Type 1 fracture of sacrum, subsequent encounter for fracture with routine healing: Secondary | ICD-10-CM | POA: Diagnosis not present

## 2023-10-10 DIAGNOSIS — S32021D Stable burst fracture of second lumbar vertebra, subsequent encounter for fracture with routine healing: Secondary | ICD-10-CM | POA: Diagnosis not present

## 2023-10-10 DIAGNOSIS — K219 Gastro-esophageal reflux disease without esophagitis: Secondary | ICD-10-CM | POA: Diagnosis not present

## 2023-10-11 DIAGNOSIS — E039 Hypothyroidism, unspecified: Secondary | ICD-10-CM | POA: Diagnosis not present

## 2023-10-11 DIAGNOSIS — I5032 Chronic diastolic (congestive) heart failure: Secondary | ICD-10-CM | POA: Diagnosis not present

## 2023-10-11 DIAGNOSIS — S32021D Stable burst fracture of second lumbar vertebra, subsequent encounter for fracture with routine healing: Secondary | ICD-10-CM | POA: Diagnosis not present

## 2023-10-11 DIAGNOSIS — N1832 Chronic kidney disease, stage 3b: Secondary | ICD-10-CM | POA: Diagnosis not present

## 2023-10-11 DIAGNOSIS — S2241XD Multiple fractures of ribs, right side, subsequent encounter for fracture with routine healing: Secondary | ICD-10-CM | POA: Diagnosis not present

## 2023-10-11 DIAGNOSIS — G8929 Other chronic pain: Secondary | ICD-10-CM | POA: Diagnosis not present

## 2023-10-11 DIAGNOSIS — S3214XD Type 1 fracture of sacrum, subsequent encounter for fracture with routine healing: Secondary | ICD-10-CM | POA: Diagnosis not present

## 2023-10-11 DIAGNOSIS — I4892 Unspecified atrial flutter: Secondary | ICD-10-CM | POA: Diagnosis not present

## 2023-10-11 DIAGNOSIS — M51369 Other intervertebral disc degeneration, lumbar region without mention of lumbar back pain or lower extremity pain: Secondary | ICD-10-CM | POA: Diagnosis not present

## 2023-10-11 DIAGNOSIS — M109 Gout, unspecified: Secondary | ICD-10-CM | POA: Diagnosis not present

## 2023-10-11 DIAGNOSIS — D62 Acute posthemorrhagic anemia: Secondary | ICD-10-CM | POA: Diagnosis not present

## 2023-10-11 DIAGNOSIS — K922 Gastrointestinal hemorrhage, unspecified: Secondary | ICD-10-CM | POA: Diagnosis not present

## 2023-10-11 DIAGNOSIS — I2510 Atherosclerotic heart disease of native coronary artery without angina pectoris: Secondary | ICD-10-CM | POA: Diagnosis not present

## 2023-10-11 DIAGNOSIS — M069 Rheumatoid arthritis, unspecified: Secondary | ICD-10-CM | POA: Diagnosis not present

## 2023-10-11 DIAGNOSIS — I482 Chronic atrial fibrillation, unspecified: Secondary | ICD-10-CM | POA: Diagnosis not present

## 2023-10-11 DIAGNOSIS — E871 Hypo-osmolality and hyponatremia: Secondary | ICD-10-CM | POA: Diagnosis not present

## 2023-10-11 DIAGNOSIS — E876 Hypokalemia: Secondary | ICD-10-CM | POA: Diagnosis not present

## 2023-10-11 DIAGNOSIS — I130 Hypertensive heart and chronic kidney disease with heart failure and stage 1 through stage 4 chronic kidney disease, or unspecified chronic kidney disease: Secondary | ICD-10-CM | POA: Diagnosis not present

## 2023-10-11 DIAGNOSIS — M47817 Spondylosis without myelopathy or radiculopathy, lumbosacral region: Secondary | ICD-10-CM | POA: Diagnosis not present

## 2023-10-11 DIAGNOSIS — D509 Iron deficiency anemia, unspecified: Secondary | ICD-10-CM | POA: Diagnosis not present

## 2023-10-11 DIAGNOSIS — K219 Gastro-esophageal reflux disease without esophagitis: Secondary | ICD-10-CM | POA: Diagnosis not present

## 2023-10-12 DIAGNOSIS — G8929 Other chronic pain: Secondary | ICD-10-CM | POA: Diagnosis not present

## 2023-10-12 DIAGNOSIS — N1832 Chronic kidney disease, stage 3b: Secondary | ICD-10-CM | POA: Diagnosis not present

## 2023-10-12 DIAGNOSIS — K219 Gastro-esophageal reflux disease without esophagitis: Secondary | ICD-10-CM | POA: Diagnosis not present

## 2023-10-12 DIAGNOSIS — M51369 Other intervertebral disc degeneration, lumbar region without mention of lumbar back pain or lower extremity pain: Secondary | ICD-10-CM | POA: Diagnosis not present

## 2023-10-12 DIAGNOSIS — E039 Hypothyroidism, unspecified: Secondary | ICD-10-CM | POA: Diagnosis not present

## 2023-10-12 DIAGNOSIS — M109 Gout, unspecified: Secondary | ICD-10-CM | POA: Diagnosis not present

## 2023-10-12 DIAGNOSIS — I4892 Unspecified atrial flutter: Secondary | ICD-10-CM | POA: Diagnosis not present

## 2023-10-12 DIAGNOSIS — S2241XD Multiple fractures of ribs, right side, subsequent encounter for fracture with routine healing: Secondary | ICD-10-CM | POA: Diagnosis not present

## 2023-10-12 DIAGNOSIS — E871 Hypo-osmolality and hyponatremia: Secondary | ICD-10-CM | POA: Diagnosis not present

## 2023-10-12 DIAGNOSIS — M069 Rheumatoid arthritis, unspecified: Secondary | ICD-10-CM | POA: Diagnosis not present

## 2023-10-12 DIAGNOSIS — K922 Gastrointestinal hemorrhage, unspecified: Secondary | ICD-10-CM | POA: Diagnosis not present

## 2023-10-12 DIAGNOSIS — I5032 Chronic diastolic (congestive) heart failure: Secondary | ICD-10-CM | POA: Diagnosis not present

## 2023-10-12 DIAGNOSIS — D509 Iron deficiency anemia, unspecified: Secondary | ICD-10-CM | POA: Diagnosis not present

## 2023-10-12 DIAGNOSIS — I130 Hypertensive heart and chronic kidney disease with heart failure and stage 1 through stage 4 chronic kidney disease, or unspecified chronic kidney disease: Secondary | ICD-10-CM | POA: Diagnosis not present

## 2023-10-12 DIAGNOSIS — M47817 Spondylosis without myelopathy or radiculopathy, lumbosacral region: Secondary | ICD-10-CM | POA: Diagnosis not present

## 2023-10-12 DIAGNOSIS — D62 Acute posthemorrhagic anemia: Secondary | ICD-10-CM | POA: Diagnosis not present

## 2023-10-12 DIAGNOSIS — E876 Hypokalemia: Secondary | ICD-10-CM | POA: Diagnosis not present

## 2023-10-12 DIAGNOSIS — S32021D Stable burst fracture of second lumbar vertebra, subsequent encounter for fracture with routine healing: Secondary | ICD-10-CM | POA: Diagnosis not present

## 2023-10-12 DIAGNOSIS — S3214XD Type 1 fracture of sacrum, subsequent encounter for fracture with routine healing: Secondary | ICD-10-CM | POA: Diagnosis not present

## 2023-10-12 DIAGNOSIS — I482 Chronic atrial fibrillation, unspecified: Secondary | ICD-10-CM | POA: Diagnosis not present

## 2023-10-12 DIAGNOSIS — I2510 Atherosclerotic heart disease of native coronary artery without angina pectoris: Secondary | ICD-10-CM | POA: Diagnosis not present

## 2023-10-16 DIAGNOSIS — S2241XD Multiple fractures of ribs, right side, subsequent encounter for fracture with routine healing: Secondary | ICD-10-CM | POA: Diagnosis not present

## 2023-10-16 DIAGNOSIS — D62 Acute posthemorrhagic anemia: Secondary | ICD-10-CM | POA: Diagnosis not present

## 2023-10-16 DIAGNOSIS — I130 Hypertensive heart and chronic kidney disease with heart failure and stage 1 through stage 4 chronic kidney disease, or unspecified chronic kidney disease: Secondary | ICD-10-CM | POA: Diagnosis not present

## 2023-10-16 DIAGNOSIS — E876 Hypokalemia: Secondary | ICD-10-CM | POA: Diagnosis not present

## 2023-10-16 DIAGNOSIS — S3214XD Type 1 fracture of sacrum, subsequent encounter for fracture with routine healing: Secondary | ICD-10-CM | POA: Diagnosis not present

## 2023-10-16 DIAGNOSIS — K219 Gastro-esophageal reflux disease without esophagitis: Secondary | ICD-10-CM | POA: Diagnosis not present

## 2023-10-16 DIAGNOSIS — M109 Gout, unspecified: Secondary | ICD-10-CM | POA: Diagnosis not present

## 2023-10-16 DIAGNOSIS — N1832 Chronic kidney disease, stage 3b: Secondary | ICD-10-CM | POA: Diagnosis not present

## 2023-10-16 DIAGNOSIS — E871 Hypo-osmolality and hyponatremia: Secondary | ICD-10-CM | POA: Diagnosis not present

## 2023-10-16 DIAGNOSIS — M069 Rheumatoid arthritis, unspecified: Secondary | ICD-10-CM | POA: Diagnosis not present

## 2023-10-16 DIAGNOSIS — I5032 Chronic diastolic (congestive) heart failure: Secondary | ICD-10-CM | POA: Diagnosis not present

## 2023-10-16 DIAGNOSIS — I2510 Atherosclerotic heart disease of native coronary artery without angina pectoris: Secondary | ICD-10-CM | POA: Diagnosis not present

## 2023-10-16 DIAGNOSIS — I482 Chronic atrial fibrillation, unspecified: Secondary | ICD-10-CM | POA: Diagnosis not present

## 2023-10-16 DIAGNOSIS — G8929 Other chronic pain: Secondary | ICD-10-CM | POA: Diagnosis not present

## 2023-10-16 DIAGNOSIS — M51369 Other intervertebral disc degeneration, lumbar region without mention of lumbar back pain or lower extremity pain: Secondary | ICD-10-CM | POA: Diagnosis not present

## 2023-10-16 DIAGNOSIS — D509 Iron deficiency anemia, unspecified: Secondary | ICD-10-CM | POA: Diagnosis not present

## 2023-10-16 DIAGNOSIS — M47817 Spondylosis without myelopathy or radiculopathy, lumbosacral region: Secondary | ICD-10-CM | POA: Diagnosis not present

## 2023-10-16 DIAGNOSIS — K922 Gastrointestinal hemorrhage, unspecified: Secondary | ICD-10-CM | POA: Diagnosis not present

## 2023-10-16 DIAGNOSIS — I4892 Unspecified atrial flutter: Secondary | ICD-10-CM | POA: Diagnosis not present

## 2023-10-16 DIAGNOSIS — S32021D Stable burst fracture of second lumbar vertebra, subsequent encounter for fracture with routine healing: Secondary | ICD-10-CM | POA: Diagnosis not present

## 2023-10-16 DIAGNOSIS — E039 Hypothyroidism, unspecified: Secondary | ICD-10-CM | POA: Diagnosis not present

## 2023-10-18 DIAGNOSIS — C170 Malignant neoplasm of duodenum: Secondary | ICD-10-CM | POA: Diagnosis not present

## 2023-10-18 DIAGNOSIS — I130 Hypertensive heart and chronic kidney disease with heart failure and stage 1 through stage 4 chronic kidney disease, or unspecified chronic kidney disease: Secondary | ICD-10-CM | POA: Diagnosis not present

## 2023-10-18 DIAGNOSIS — E871 Hypo-osmolality and hyponatremia: Secondary | ICD-10-CM | POA: Diagnosis not present

## 2023-10-18 DIAGNOSIS — M47817 Spondylosis without myelopathy or radiculopathy, lumbosacral region: Secondary | ICD-10-CM | POA: Diagnosis not present

## 2023-10-18 DIAGNOSIS — I482 Chronic atrial fibrillation, unspecified: Secondary | ICD-10-CM | POA: Diagnosis not present

## 2023-10-18 DIAGNOSIS — M109 Gout, unspecified: Secondary | ICD-10-CM | POA: Diagnosis not present

## 2023-10-18 DIAGNOSIS — D62 Acute posthemorrhagic anemia: Secondary | ICD-10-CM | POA: Diagnosis not present

## 2023-10-18 DIAGNOSIS — I2510 Atherosclerotic heart disease of native coronary artery without angina pectoris: Secondary | ICD-10-CM | POA: Diagnosis not present

## 2023-10-18 DIAGNOSIS — K922 Gastrointestinal hemorrhage, unspecified: Secondary | ICD-10-CM | POA: Diagnosis not present

## 2023-10-18 DIAGNOSIS — E039 Hypothyroidism, unspecified: Secondary | ICD-10-CM | POA: Diagnosis not present

## 2023-10-18 DIAGNOSIS — I4892 Unspecified atrial flutter: Secondary | ICD-10-CM | POA: Diagnosis not present

## 2023-10-18 DIAGNOSIS — S2241XD Multiple fractures of ribs, right side, subsequent encounter for fracture with routine healing: Secondary | ICD-10-CM | POA: Diagnosis not present

## 2023-10-18 DIAGNOSIS — M51369 Other intervertebral disc degeneration, lumbar region without mention of lumbar back pain or lower extremity pain: Secondary | ICD-10-CM | POA: Diagnosis not present

## 2023-10-18 DIAGNOSIS — I5032 Chronic diastolic (congestive) heart failure: Secondary | ICD-10-CM | POA: Diagnosis not present

## 2023-10-18 DIAGNOSIS — M069 Rheumatoid arthritis, unspecified: Secondary | ICD-10-CM | POA: Diagnosis not present

## 2023-10-18 DIAGNOSIS — N1832 Chronic kidney disease, stage 3b: Secondary | ICD-10-CM | POA: Diagnosis not present

## 2023-10-18 DIAGNOSIS — G8929 Other chronic pain: Secondary | ICD-10-CM | POA: Diagnosis not present

## 2023-10-18 DIAGNOSIS — S3214XD Type 1 fracture of sacrum, subsequent encounter for fracture with routine healing: Secondary | ICD-10-CM | POA: Diagnosis not present

## 2023-10-18 DIAGNOSIS — D509 Iron deficiency anemia, unspecified: Secondary | ICD-10-CM | POA: Diagnosis not present

## 2023-10-18 DIAGNOSIS — E876 Hypokalemia: Secondary | ICD-10-CM | POA: Diagnosis not present

## 2023-10-18 DIAGNOSIS — K219 Gastro-esophageal reflux disease without esophagitis: Secondary | ICD-10-CM | POA: Diagnosis not present

## 2023-10-18 DIAGNOSIS — S32021D Stable burst fracture of second lumbar vertebra, subsequent encounter for fracture with routine healing: Secondary | ICD-10-CM | POA: Diagnosis not present

## 2023-10-19 DIAGNOSIS — Z79899 Other long term (current) drug therapy: Secondary | ICD-10-CM | POA: Diagnosis not present

## 2023-10-19 DIAGNOSIS — Z87891 Personal history of nicotine dependence: Secondary | ICD-10-CM | POA: Diagnosis not present

## 2023-10-19 DIAGNOSIS — D696 Thrombocytopenia, unspecified: Secondary | ICD-10-CM | POA: Diagnosis not present

## 2023-10-19 DIAGNOSIS — Z7901 Long term (current) use of anticoagulants: Secondary | ICD-10-CM | POA: Diagnosis not present

## 2023-10-19 DIAGNOSIS — K317 Polyp of stomach and duodenum: Secondary | ICD-10-CM | POA: Diagnosis not present

## 2023-10-19 DIAGNOSIS — Z86718 Personal history of other venous thrombosis and embolism: Secondary | ICD-10-CM | POA: Diagnosis not present

## 2023-10-19 DIAGNOSIS — D509 Iron deficiency anemia, unspecified: Secondary | ICD-10-CM | POA: Diagnosis not present

## 2023-10-19 DIAGNOSIS — D490 Neoplasm of unspecified behavior of digestive system: Secondary | ICD-10-CM | POA: Diagnosis not present

## 2023-10-19 DIAGNOSIS — K219 Gastro-esophageal reflux disease without esophagitis: Secondary | ICD-10-CM | POA: Diagnosis not present

## 2023-10-19 DIAGNOSIS — M109 Gout, unspecified: Secondary | ICD-10-CM | POA: Diagnosis not present

## 2023-10-19 DIAGNOSIS — K3189 Other diseases of stomach and duodenum: Secondary | ICD-10-CM | POA: Diagnosis not present

## 2023-10-19 DIAGNOSIS — I110 Hypertensive heart disease with heart failure: Secondary | ICD-10-CM | POA: Diagnosis not present

## 2023-10-19 DIAGNOSIS — E039 Hypothyroidism, unspecified: Secondary | ICD-10-CM | POA: Diagnosis not present

## 2023-10-19 DIAGNOSIS — D132 Benign neoplasm of duodenum: Secondary | ICD-10-CM | POA: Diagnosis not present

## 2023-10-19 DIAGNOSIS — I4891 Unspecified atrial fibrillation: Secondary | ICD-10-CM | POA: Diagnosis not present

## 2023-10-19 DIAGNOSIS — E785 Hyperlipidemia, unspecified: Secondary | ICD-10-CM | POA: Diagnosis not present

## 2023-10-19 DIAGNOSIS — C170 Malignant neoplasm of duodenum: Secondary | ICD-10-CM | POA: Diagnosis not present

## 2023-10-19 DIAGNOSIS — I509 Heart failure, unspecified: Secondary | ICD-10-CM | POA: Diagnosis not present

## 2023-10-19 DIAGNOSIS — I5032 Chronic diastolic (congestive) heart failure: Secondary | ICD-10-CM | POA: Diagnosis not present

## 2023-10-20 DIAGNOSIS — S32021A Stable burst fracture of second lumbar vertebra, initial encounter for closed fracture: Secondary | ICD-10-CM | POA: Diagnosis not present

## 2023-10-20 DIAGNOSIS — Z7901 Long term (current) use of anticoagulants: Secondary | ICD-10-CM | POA: Diagnosis not present

## 2023-10-20 DIAGNOSIS — S2249XD Multiple fractures of ribs, unspecified side, subsequent encounter for fracture with routine healing: Secondary | ICD-10-CM | POA: Diagnosis not present

## 2023-10-20 DIAGNOSIS — D509 Iron deficiency anemia, unspecified: Secondary | ICD-10-CM | POA: Diagnosis not present

## 2023-10-20 DIAGNOSIS — R161 Splenomegaly, not elsewhere classified: Secondary | ICD-10-CM | POA: Diagnosis not present

## 2023-10-20 DIAGNOSIS — C170 Malignant neoplasm of duodenum: Secondary | ICD-10-CM | POA: Diagnosis not present

## 2023-10-20 DIAGNOSIS — Z86718 Personal history of other venous thrombosis and embolism: Secondary | ICD-10-CM | POA: Diagnosis not present

## 2023-10-20 DIAGNOSIS — E039 Hypothyroidism, unspecified: Secondary | ICD-10-CM | POA: Diagnosis not present

## 2023-10-20 DIAGNOSIS — E042 Nontoxic multinodular goiter: Secondary | ICD-10-CM | POA: Diagnosis not present

## 2023-10-20 DIAGNOSIS — D696 Thrombocytopenia, unspecified: Secondary | ICD-10-CM | POA: Diagnosis not present

## 2023-10-20 DIAGNOSIS — R918 Other nonspecific abnormal finding of lung field: Secondary | ICD-10-CM | POA: Diagnosis not present

## 2023-10-20 DIAGNOSIS — M109 Gout, unspecified: Secondary | ICD-10-CM | POA: Diagnosis not present

## 2023-10-20 DIAGNOSIS — E785 Hyperlipidemia, unspecified: Secondary | ICD-10-CM | POA: Diagnosis not present

## 2023-10-20 DIAGNOSIS — I4891 Unspecified atrial fibrillation: Secondary | ICD-10-CM | POA: Diagnosis not present

## 2023-10-20 DIAGNOSIS — Z87891 Personal history of nicotine dependence: Secondary | ICD-10-CM | POA: Diagnosis not present

## 2023-10-20 DIAGNOSIS — K219 Gastro-esophageal reflux disease without esophagitis: Secondary | ICD-10-CM | POA: Diagnosis not present

## 2023-10-20 DIAGNOSIS — I5032 Chronic diastolic (congestive) heart failure: Secondary | ICD-10-CM | POA: Diagnosis not present

## 2023-10-20 DIAGNOSIS — D132 Benign neoplasm of duodenum: Secondary | ICD-10-CM | POA: Diagnosis not present

## 2023-10-20 DIAGNOSIS — Z79899 Other long term (current) drug therapy: Secondary | ICD-10-CM | POA: Diagnosis not present

## 2023-10-20 DIAGNOSIS — I110 Hypertensive heart disease with heart failure: Secondary | ICD-10-CM | POA: Diagnosis not present

## 2023-10-21 ENCOUNTER — Encounter (HOSPITAL_COMMUNITY): Payer: Self-pay | Admitting: *Deleted

## 2023-10-21 ENCOUNTER — Other Ambulatory Visit: Payer: Self-pay

## 2023-10-21 ENCOUNTER — Inpatient Hospital Stay (HOSPITAL_COMMUNITY)
Admission: EM | Admit: 2023-10-21 | Discharge: 2023-10-25 | DRG: 563 | Disposition: A | Discharge status: Skilled Nursing Facility | Attending: Family Medicine | Admitting: Family Medicine

## 2023-10-21 ENCOUNTER — Emergency Department (HOSPITAL_COMMUNITY)

## 2023-10-21 DIAGNOSIS — M25519 Pain in unspecified shoulder: Secondary | ICD-10-CM | POA: Diagnosis not present

## 2023-10-21 DIAGNOSIS — Z8711 Personal history of peptic ulcer disease: Secondary | ICD-10-CM

## 2023-10-21 DIAGNOSIS — I480 Paroxysmal atrial fibrillation: Secondary | ICD-10-CM | POA: Diagnosis present

## 2023-10-21 DIAGNOSIS — Z043 Encounter for examination and observation following other accident: Secondary | ICD-10-CM | POA: Diagnosis not present

## 2023-10-21 DIAGNOSIS — W19XXXA Unspecified fall, initial encounter: Secondary | ICD-10-CM | POA: Diagnosis not present

## 2023-10-21 DIAGNOSIS — Y92009 Unspecified place in unspecified non-institutional (private) residence as the place of occurrence of the external cause: Secondary | ICD-10-CM

## 2023-10-21 DIAGNOSIS — G2581 Restless legs syndrome: Secondary | ICD-10-CM | POA: Diagnosis present

## 2023-10-21 DIAGNOSIS — C170 Malignant neoplasm of duodenum: Secondary | ICD-10-CM

## 2023-10-21 DIAGNOSIS — Z87891 Personal history of nicotine dependence: Secondary | ICD-10-CM

## 2023-10-21 DIAGNOSIS — Z6837 Body mass index (BMI) 37.0-37.9, adult: Secondary | ICD-10-CM

## 2023-10-21 DIAGNOSIS — R232 Flushing: Secondary | ICD-10-CM | POA: Diagnosis present

## 2023-10-21 DIAGNOSIS — Z91048 Other nonmedicinal substance allergy status: Secondary | ICD-10-CM

## 2023-10-21 DIAGNOSIS — Z8659 Personal history of other mental and behavioral disorders: Secondary | ICD-10-CM | POA: Diagnosis not present

## 2023-10-21 DIAGNOSIS — R519 Headache, unspecified: Secondary | ICD-10-CM | POA: Diagnosis present

## 2023-10-21 DIAGNOSIS — Z79890 Hormone replacement therapy (postmenopausal): Secondary | ICD-10-CM

## 2023-10-21 DIAGNOSIS — S42202A Unspecified fracture of upper end of left humerus, initial encounter for closed fracture: Principal | ICD-10-CM

## 2023-10-21 DIAGNOSIS — E038 Other specified hypothyroidism: Secondary | ICD-10-CM | POA: Diagnosis not present

## 2023-10-21 DIAGNOSIS — I5032 Chronic diastolic (congestive) heart failure: Secondary | ICD-10-CM | POA: Diagnosis not present

## 2023-10-21 DIAGNOSIS — S8002XA Contusion of left knee, initial encounter: Secondary | ICD-10-CM | POA: Diagnosis present

## 2023-10-21 DIAGNOSIS — Z8249 Family history of ischemic heart disease and other diseases of the circulatory system: Secondary | ICD-10-CM

## 2023-10-21 DIAGNOSIS — Z885 Allergy status to narcotic agent status: Secondary | ICD-10-CM

## 2023-10-21 DIAGNOSIS — S72113A Displaced fracture of greater trochanter of unspecified femur, initial encounter for closed fracture: Secondary | ICD-10-CM

## 2023-10-21 DIAGNOSIS — M069 Rheumatoid arthritis, unspecified: Secondary | ICD-10-CM | POA: Diagnosis present

## 2023-10-21 DIAGNOSIS — T07XXXA Unspecified multiple injuries, initial encounter: Secondary | ICD-10-CM | POA: Diagnosis present

## 2023-10-21 DIAGNOSIS — E785 Hyperlipidemia, unspecified: Secondary | ICD-10-CM | POA: Diagnosis present

## 2023-10-21 DIAGNOSIS — Z800 Family history of malignant neoplasm of digestive organs: Secondary | ICD-10-CM

## 2023-10-21 DIAGNOSIS — N1831 Chronic kidney disease, stage 3a: Secondary | ICD-10-CM | POA: Diagnosis present

## 2023-10-21 DIAGNOSIS — Z86718 Personal history of other venous thrombosis and embolism: Secondary | ICD-10-CM

## 2023-10-21 DIAGNOSIS — I714 Abdominal aortic aneurysm, without rupture, unspecified: Secondary | ICD-10-CM | POA: Diagnosis present

## 2023-10-21 DIAGNOSIS — M19012 Primary osteoarthritis, left shoulder: Secondary | ICD-10-CM | POA: Diagnosis not present

## 2023-10-21 DIAGNOSIS — R0902 Hypoxemia: Secondary | ICD-10-CM | POA: Diagnosis not present

## 2023-10-21 DIAGNOSIS — E039 Hypothyroidism, unspecified: Secondary | ICD-10-CM | POA: Diagnosis present

## 2023-10-21 DIAGNOSIS — E7849 Other hyperlipidemia: Secondary | ICD-10-CM

## 2023-10-21 DIAGNOSIS — C169 Malignant neoplasm of stomach, unspecified: Secondary | ICD-10-CM | POA: Diagnosis not present

## 2023-10-21 DIAGNOSIS — S0990XA Unspecified injury of head, initial encounter: Secondary | ICD-10-CM | POA: Diagnosis present

## 2023-10-21 DIAGNOSIS — J45909 Unspecified asthma, uncomplicated: Secondary | ICD-10-CM | POA: Diagnosis present

## 2023-10-21 DIAGNOSIS — S42302A Unspecified fracture of shaft of humerus, left arm, initial encounter for closed fracture: Secondary | ICD-10-CM

## 2023-10-21 DIAGNOSIS — F411 Generalized anxiety disorder: Secondary | ICD-10-CM | POA: Diagnosis present

## 2023-10-21 DIAGNOSIS — S42402A Unspecified fracture of lower end of left humerus, initial encounter for closed fracture: Secondary | ICD-10-CM

## 2023-10-21 DIAGNOSIS — S42212A Unspecified displaced fracture of surgical neck of left humerus, initial encounter for closed fracture: Secondary | ICD-10-CM | POA: Diagnosis not present

## 2023-10-21 DIAGNOSIS — Z8509 Personal history of malignant neoplasm of other digestive organs: Secondary | ICD-10-CM

## 2023-10-21 DIAGNOSIS — Z8719 Personal history of other diseases of the digestive system: Secondary | ICD-10-CM

## 2023-10-21 DIAGNOSIS — E876 Hypokalemia: Secondary | ICD-10-CM | POA: Diagnosis present

## 2023-10-21 DIAGNOSIS — I672 Cerebral atherosclerosis: Secondary | ICD-10-CM | POA: Diagnosis not present

## 2023-10-21 DIAGNOSIS — S42292A Other displaced fracture of upper end of left humerus, initial encounter for closed fracture: Principal | ICD-10-CM | POA: Diagnosis present

## 2023-10-21 DIAGNOSIS — Z90710 Acquired absence of both cervix and uterus: Secondary | ICD-10-CM

## 2023-10-21 DIAGNOSIS — I130 Hypertensive heart and chronic kidney disease with heart failure and stage 1 through stage 4 chronic kidney disease, or unspecified chronic kidney disease: Secondary | ICD-10-CM | POA: Diagnosis present

## 2023-10-21 DIAGNOSIS — N183 Chronic kidney disease, stage 3 unspecified: Secondary | ICD-10-CM | POA: Diagnosis present

## 2023-10-21 DIAGNOSIS — M109 Gout, unspecified: Secondary | ICD-10-CM | POA: Diagnosis present

## 2023-10-21 DIAGNOSIS — Z8261 Family history of arthritis: Secondary | ICD-10-CM

## 2023-10-21 DIAGNOSIS — I10 Essential (primary) hypertension: Secondary | ICD-10-CM | POA: Diagnosis not present

## 2023-10-21 DIAGNOSIS — D630 Anemia in neoplastic disease: Secondary | ICD-10-CM | POA: Diagnosis present

## 2023-10-21 DIAGNOSIS — Z79899 Other long term (current) drug therapy: Secondary | ICD-10-CM

## 2023-10-21 DIAGNOSIS — Z833 Family history of diabetes mellitus: Secondary | ICD-10-CM

## 2023-10-21 DIAGNOSIS — Z7901 Long term (current) use of anticoagulants: Secondary | ICD-10-CM

## 2023-10-21 DIAGNOSIS — I700 Atherosclerosis of aorta: Secondary | ICD-10-CM | POA: Diagnosis not present

## 2023-10-21 DIAGNOSIS — Z8042 Family history of malignant neoplasm of prostate: Secondary | ICD-10-CM

## 2023-10-21 DIAGNOSIS — F32A Depression, unspecified: Secondary | ICD-10-CM | POA: Diagnosis present

## 2023-10-21 DIAGNOSIS — W050XXA Fall from non-moving wheelchair, initial encounter: Secondary | ICD-10-CM | POA: Diagnosis present

## 2023-10-21 DIAGNOSIS — I5033 Acute on chronic diastolic (congestive) heart failure: Secondary | ICD-10-CM | POA: Diagnosis present

## 2023-10-21 DIAGNOSIS — K219 Gastro-esophageal reflux disease without esophagitis: Secondary | ICD-10-CM | POA: Diagnosis present

## 2023-10-21 HISTORY — DX: Unspecified fracture of shaft of humerus, left arm, initial encounter for closed fracture: S42.302A

## 2023-10-21 HISTORY — DX: Unspecified fracture of lower end of left humerus, initial encounter for closed fracture: S42.402A

## 2023-10-21 HISTORY — DX: Personal history of other diseases of the digestive system: Z87.19

## 2023-10-21 HISTORY — DX: Unspecified fall, initial encounter: W19.XXXA

## 2023-10-21 HISTORY — DX: Malignant neoplasm of duodenum: C17.0

## 2023-10-21 HISTORY — DX: Personal history of other mental and behavioral disorders: Z86.59

## 2023-10-21 LAB — CBC WITH DIFFERENTIAL/PLATELET
Abs Immature Granulocytes: 0.07 10*3/uL (ref 0.00–0.07)
Basophils Absolute: 0 10*3/uL (ref 0.0–0.1)
Basophils Relative: 0 %
Eosinophils Absolute: 0 10*3/uL (ref 0.0–0.5)
Eosinophils Relative: 1 %
HCT: 29.4 % — ABNORMAL LOW (ref 36.0–46.0)
Hemoglobin: 8.8 g/dL — ABNORMAL LOW (ref 12.0–15.0)
Immature Granulocytes: 1 %
Lymphocytes Relative: 8 %
Lymphs Abs: 0.5 10*3/uL — ABNORMAL LOW (ref 0.7–4.0)
MCH: 29.4 pg (ref 26.0–34.0)
MCHC: 29.9 g/dL — ABNORMAL LOW (ref 30.0–36.0)
MCV: 98.3 fL (ref 80.0–100.0)
Monocytes Absolute: 0.5 10*3/uL (ref 0.1–1.0)
Monocytes Relative: 8 %
Neutro Abs: 5.3 10*3/uL (ref 1.7–7.7)
Neutrophils Relative %: 82 %
Platelets: 83 10*3/uL — ABNORMAL LOW (ref 150–400)
RBC: 2.99 MIL/uL — ABNORMAL LOW (ref 3.87–5.11)
RDW: 15.9 % — ABNORMAL HIGH (ref 11.5–15.5)
WBC: 6.5 10*3/uL (ref 4.0–10.5)
nRBC: 0 % (ref 0.0–0.2)

## 2023-10-21 LAB — BASIC METABOLIC PANEL WITH GFR
Anion gap: 4 — ABNORMAL LOW (ref 5–15)
BUN: 17 mg/dL (ref 8–23)
CO2: 34 mmol/L — ABNORMAL HIGH (ref 22–32)
Calcium: 8.6 mg/dL — ABNORMAL LOW (ref 8.9–10.3)
Chloride: 102 mmol/L (ref 98–111)
Creatinine, Ser: 1.11 mg/dL — ABNORMAL HIGH (ref 0.44–1.00)
GFR, Estimated: 51 mL/min — ABNORMAL LOW (ref >60)
Glucose, Bld: 108 mg/dL — ABNORMAL HIGH (ref 70–99)
Potassium: 3.6 mmol/L (ref 3.5–5.1)
Sodium: 140 mmol/L (ref 135–145)

## 2023-10-21 LAB — CK: Total CK: 32 U/L — ABNORMAL LOW (ref 38–234)

## 2023-10-21 MED ORDER — BUSPIRONE HCL 10 MG PO TABS
10.0000 mg | ORAL_TABLET | Freq: Two times a day (BID) | ORAL | Status: DC
  Administered 2023-10-22 – 2023-10-25 (×7): 10 mg via ORAL
  Filled 2023-10-21 (×7): qty 1

## 2023-10-21 MED ORDER — CHLORTHALIDONE 25 MG PO TABS
25.0000 mg | ORAL_TABLET | Freq: Every day | ORAL | Status: DC
  Administered 2023-10-22 – 2023-10-25 (×4): 25 mg via ORAL
  Filled 2023-10-21 (×4): qty 1

## 2023-10-21 MED ORDER — TORSEMIDE 20 MG PO TABS
20.0000 mg | ORAL_TABLET | Freq: Every day | ORAL | Status: DC
  Administered 2023-10-22 – 2023-10-25 (×4): 20 mg via ORAL
  Filled 2023-10-21 (×4): qty 1

## 2023-10-21 MED ORDER — FERROUS SULFATE 325 (65 FE) MG PO TABS
325.0000 mg | ORAL_TABLET | Freq: Every day | ORAL | Status: DC
  Administered 2023-10-22 – 2023-10-25 (×4): 325 mg via ORAL
  Filled 2023-10-21 (×4): qty 1

## 2023-10-21 MED ORDER — DULOXETINE HCL 20 MG PO CPEP
20.0000 mg | ORAL_CAPSULE | Freq: Every day | ORAL | Status: DC
  Administered 2023-10-22 – 2023-10-25 (×4): 20 mg via ORAL
  Filled 2023-10-21 (×4): qty 1

## 2023-10-21 MED ORDER — FOLIC ACID 1 MG PO TABS
1.0000 mg | ORAL_TABLET | Freq: Every day | ORAL | Status: DC
  Administered 2023-10-22 – 2023-10-25 (×4): 1 mg via ORAL
  Filled 2023-10-21 (×4): qty 1

## 2023-10-21 MED ORDER — ONDANSETRON HCL 4 MG PO TABS: 4.0000 mg | ORAL_TABLET | Freq: Four times a day (QID) | ORAL | Status: DC | PRN

## 2023-10-21 MED ORDER — MELATONIN 3 MG PO TABS
3.0000 mg | ORAL_TABLET | Freq: Every evening | ORAL | Status: DC | PRN
  Administered 2023-10-22 – 2023-10-24 (×3): 3 mg via ORAL
  Filled 2023-10-21 (×3): qty 1

## 2023-10-21 MED ORDER — SODIUM CHLORIDE 0.9 % IV SOLN: 250.0000 mL | INTRAVENOUS | Status: AC | PRN

## 2023-10-21 MED ORDER — METHOCARBAMOL 500 MG PO TABS
500.0000 mg | ORAL_TABLET | Freq: Four times a day (QID) | ORAL | Status: DC | PRN
  Administered 2023-10-22 – 2023-10-25 (×3): 500 mg via ORAL
  Filled 2023-10-21 (×3): qty 1

## 2023-10-21 MED ORDER — ATORVASTATIN CALCIUM 10 MG PO TABS
20.0000 mg | ORAL_TABLET | Freq: Every day | ORAL | Status: DC
  Administered 2023-10-22 – 2023-10-25 (×4): 20 mg via ORAL
  Filled 2023-10-21 (×4): qty 2

## 2023-10-21 MED ORDER — AMIODARONE HCL 200 MG PO TABS
200.0000 mg | ORAL_TABLET | Freq: Every day | ORAL | Status: DC
  Administered 2023-10-22 – 2023-10-25 (×4): 200 mg via ORAL
  Filled 2023-10-21 (×4): qty 1

## 2023-10-21 MED ORDER — ACETAMINOPHEN 650 MG RE SUPP: 650.0000 mg | Freq: Four times a day (QID) | RECTAL | Status: DC | PRN

## 2023-10-21 MED ORDER — FENTANYL CITRATE PF 50 MCG/ML IJ SOSY
50.0000 ug | PREFILLED_SYRINGE | Freq: Once | INTRAMUSCULAR | Status: AC
  Administered 2023-10-21: 50 ug via INTRAVENOUS
  Filled 2023-10-21: qty 1

## 2023-10-21 MED ORDER — FENTANYL CITRATE PF 50 MCG/ML IJ SOSY
50.0000 ug | PREFILLED_SYRINGE | Freq: Once | INTRAMUSCULAR | Status: AC
  Administered 2023-10-22: 50 ug via INTRAVENOUS
  Filled 2023-10-21: qty 1

## 2023-10-21 MED ORDER — ALBUTEROL SULFATE (2.5 MG/3ML) 0.083% IN NEBU
2.5000 mg | INHALATION_SOLUTION | RESPIRATORY_TRACT | Status: DC | PRN
Start: 2023-10-21 — End: 2023-10-25

## 2023-10-21 MED ORDER — SODIUM CHLORIDE 0.9% FLUSH: 3.0000 mL | INTRAVENOUS | Status: DC | PRN

## 2023-10-21 MED ORDER — LEVOTHYROXINE SODIUM 100 MCG PO TABS
100.0000 ug | ORAL_TABLET | Freq: Every day | ORAL | Status: DC
  Administered 2023-10-22 – 2023-10-25 (×4): 100 ug via ORAL
  Filled 2023-10-21 (×5): qty 1

## 2023-10-21 MED ORDER — DILTIAZEM HCL ER COATED BEADS 120 MG PO CP24
120.0000 mg | ORAL_CAPSULE | Freq: Every day | ORAL | Status: DC
  Administered 2023-10-22 – 2023-10-25 (×4): 120 mg via ORAL
  Filled 2023-10-21 (×4): qty 1

## 2023-10-21 MED ORDER — FENTANYL CITRATE PF 50 MCG/ML IJ SOSY
12.5000 ug | PREFILLED_SYRINGE | INTRAMUSCULAR | Status: DC | PRN
  Administered 2023-10-22 – 2023-10-23 (×4): 25 ug via INTRAVENOUS
  Filled 2023-10-21 (×4): qty 1

## 2023-10-21 MED ORDER — ACETAMINOPHEN 325 MG PO TABS
650.0000 mg | ORAL_TABLET | Freq: Four times a day (QID) | ORAL | Status: DC | PRN
  Administered 2023-10-23 (×2): 650 mg via ORAL
  Filled 2023-10-21 (×2): qty 2

## 2023-10-21 MED ORDER — PANTOPRAZOLE SODIUM 40 MG PO TBEC: 40.0000 mg | DELAYED_RELEASE_TABLET | Freq: Every day | ORAL | Status: DC

## 2023-10-21 MED ORDER — SODIUM CHLORIDE 0.9% FLUSH
3.0000 mL | Freq: Two times a day (BID) | INTRAVENOUS | Status: DC
  Administered 2023-10-22 – 2023-10-25 (×7): 3 mL via INTRAVENOUS

## 2023-10-21 MED ORDER — VITAMIN B-12 1000 MCG PO TABS
1000.0000 ug | ORAL_TABLET | Freq: Every day | ORAL | Status: DC
  Administered 2023-10-22 – 2023-10-25 (×4): 1000 ug via ORAL
  Filled 2023-10-21 (×4): qty 1

## 2023-10-21 MED ORDER — SERTRALINE HCL 25 MG PO TABS
25.0000 mg | ORAL_TABLET | Freq: Every day | ORAL | Status: DC
  Administered 2023-10-22 – 2023-10-24 (×4): 25 mg via ORAL
  Filled 2023-10-21 (×4): qty 1

## 2023-10-21 MED ORDER — ALLOPURINOL 300 MG PO TABS
300.0000 mg | ORAL_TABLET | Freq: Every day | ORAL | Status: DC
  Administered 2023-10-22 – 2023-10-25 (×4): 300 mg via ORAL
  Filled 2023-10-21 (×4): qty 1

## 2023-10-21 MED ORDER — OXYCODONE HCL 5 MG PO TABS: 5.0000 mg | ORAL_TABLET | Freq: Four times a day (QID) | ORAL | Status: DC | PRN

## 2023-10-21 MED ORDER — ONDANSETRON HCL 4 MG/2ML IJ SOLN
4.0000 mg | Freq: Four times a day (QID) | INTRAMUSCULAR | Status: DC | PRN
Start: 2023-10-21 — End: 2023-10-25

## 2023-10-21 MED ORDER — PANTOPRAZOLE SODIUM 40 MG PO TBEC
40.0000 mg | DELAYED_RELEASE_TABLET | Freq: Two times a day (BID) | ORAL | Status: DC
  Administered 2023-10-22 – 2023-10-25 (×8): 40 mg via ORAL
  Filled 2023-10-21 (×8): qty 1

## 2023-10-21 NOTE — ED Notes (Signed)
 Lab to add on samples

## 2023-10-21 NOTE — ED Triage Notes (Addendum)
 Pt from home for a fall that happened earlier today. Stood from wheelchair, lost balance and fell onto left side. Bruising to left knee; limited ROM to left shoulder Recent discharge from [facility] for duodenal mass removal and anemia. Reports being on blood thinners but last dose was on Sunday 4/20.

## 2023-10-21 NOTE — H&P (Incomplete)
 History and Physical    Peggy Armstrong ZOX:[id number] DOB: November 26, 1945 DOA: 10/21/2023  PCP: Cherylene Corrente, PA-C   Patient coming from: Home   Chief Complaint:  Chief Complaint  Patient presents with   Fall   ED TRIAGE note:  Pt from home for a fall that happened earlier today. Stood from wheelchair, lost balance and fell onto left side. Bruising to left knee; limited ROM to left shoulder Recent discharge from [facility] for duodenal mass removal and anemia. Reports being on blood thinners but last dose was on Sunday 4/20.     HPI:  Peggy Armstrong is a 78 y.o. female with medical history significant HFpEF, paroxysmal atrial fibrillation on Eliquis , AAA, essential hypertension, history of recurrent GI bleed, rheumatoid arthritis, CKD stage IIIa, depression, anxiety, hypothyroidism, DVT, hyperlipidemia.  Patient has been recently hospitalized to [facility] discharged on 10/19/2023.  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   Per chart review of [facility] general surgery note recommended to hold  Eliquis  10 days postprocedure starting from 10/19/2023.  Patient presented to emergency department today for evaluation for fall.  She reported that when she stood up from the wheelchair lost balance and fell on the left side.  Reported hitting of the head.  Since the fall endorsing left-sided shoulder and knee pain.  Reported she was on the ground for almost 1 hour before family assisted her to bed. Patient denies any chest pain, palpitation, headache, blurry vision, syncope presyncope.  No other complaint at this time.  At presentation to ED patient is hemodynamically stable. CBC showing stable H&H 8.8 and 29.  Platelet count 83 which is around baseline.  Normal WBC  count. BMP showing creatinine 1.1 GFR 51.  Renal function at baseline.  Extensive imaging of the ED following: X-ray of the left shoulder showed: Acute displaced and comminuted impacted left humeral head and neck fracture.  X-ray of the left elbow: Slightly limited evaluation due to nonstandard lateral view. Vague cortical irregularity along the olecranon coronoid process. Finding could represent an avulsion fracture.  X-ray of the hip: Cortical irregularity along the left greater trochanter with acute nondisplaced fracture not excluded. Correlate with point tenderness to palpation.  X-ray of the left knee no fracture or dislocation.  CT head and CT cervical spine: No evidence of acute intracranial abnormality.  No acute displaced cervical fracture.Partial empty sella. Findings is often a normal anatomic variant but can be associated with idiopathic intracranial hypertension (pseudotumor cerebri).  CT scan of the elbow no evidence of fracture CT scan of the pelvis no evidence of fracture or dislocation.  ED physician consulted orthopedist Dr. Charol Copas recommended sling placement, not weightbearing of the left upper extremity.  F/u with Dr. Hiram Lukes in 5-7 days post dc. Call [phone] to schedule.   Hospitalist has been consulted for further evaluation management of mechanical fall and left proximal humeral fracture.  Significant labs in the ED: Lab Orders         CBC with Differential         Basic metabolic panel         CK         Comprehensive metabolic panel         CBC         Protime-INR  APTT       Review of Systems:  Review of Systems  Constitutional:  Negative for malaise/fatigue and weight loss.  Cardiovascular:  Negative for chest pain and palpitations.  Gastrointestinal:  Negative for abdominal pain, blood in stool, heartburn, melena and nausea.  Musculoskeletal:  Positive for falls and joint pain. Negative for back pain, myalgias and neck pain.        Left shoulder and elbow pain  Neurological:  Negative for dizziness and headaches.  Psychiatric/Behavioral:  The patient is not nervous/anxious.   All other systems reviewed and are negative.   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   Cellulitis of left arm 08/14/2023   Cholangitis    Choledocholithiasis 03/22/2017   Chronic diastolic CHF (congestive heart failure) (HCC) 11/08/2019   Chronic low back pain 07/18/2023   CKD (chronic kidney disease), stage III (HCC) 06/09/2017   Closed compression fracture of L2 lumbar vertebra, initial encounter (HCC) 05/11/2023   Clotting disorder (HCC)    right DVT     Dehydration 05/12/2023   Duodenal mass    Duodenal ulcer    DVT (deep venous thrombosis) (HCC)    right DVT   Epigastric pain    Essential hypertension    GAD (generalized anxiety disorder)    Gastric polyps 05/16/2023   Generalized weakness 05/12/2023   GERD (gastroesophageal reflux disease)    GI bleed 07/20/2023   Gout    Gout    Hematemesis 11/05/2017   Hemorrhagic shock (HCC) 07/21/2023   History of DVT (deep vein thrombosis) 11/08/2019   Hyperlipidemia 05/12/2023   Hypertension    Hypokalemia 07/03/2017   Iron deficiency anemia 08/02/2016   Long term current use of anticoagulant therapy 06/20/2023   Macrocytic anemia 11/05/2017   Melena 11/05/2017   Morbid obesity (HCC) 11/08/2019   Nausea & vomiting    Need for immunization against influenza 07/18/2019   Need for vaccination 04/23/2018   Occult blood in stools 05/16/2023   Paroxysmal atrial fibrillation (HCC) 05/12/2023    Peri-ampullary neoplasm    Poor dentition 08/14/2016   RA (rheumatoid arthritis) (HCC)    Restless leg 12/02/2022   Right shoulder pain 07/26/2018   Routine general medical examination at a health care facility 12/13/2019   S/P ERCP 05/05/2017   SIRS (systemic inflammatory response syndrome) (HCC) 03/22/2017   Upper GI bleed 06/20/2023    Past Surgical History:  Procedure Laterality Date   ABDOMINAL HYSTERECTOMY     BIOPSY  05/16/2023   Procedure: BIOPSY;  Surgeon: Asencion Blacksmith, MD;  Location: [facility];  Service: Gastroenterology;;   CHOLECYSTECTOMY     ENDOSCOPIC RETROGRADE CHOLANGIOPANCREATOGRAPHY (ERCP) WITH PROPOFOL  N/A 06/15/2017   Procedure: ENDOSCOPIC RETROGRADE CHOLANGIOPANCREATOGRAPHY (ERCP) WITH PROPOFOL ;  Surgeon: Janel Medford, MD;  Location: [facility];  Service: Endoscopy;  Laterality: N/A;   ESOPHAGOGASTRODUODENOSCOPY (EGD) WITH PROPOFOL  N/A 03/24/2017   Procedure: ESOPHAGOGASTRODUODENOSCOPY (EGD) WITH PROPOFOL ;  Surgeon: Albertina Hugger, MD;  Location: [facility];  Service: Gastroenterology;  Laterality: N/A;   ESOPHAGOGASTRODUODENOSCOPY (EGD) WITH PROPOFOL  N/A 11/07/2017   Procedure: ESOPHAGOGASTRODUODENOSCOPY (  EGD) WITH PROPOFOL ;  Surgeon: Janel Medford, MD;  Location: [facility];  Service: Endoscopy;  Laterality: N/A;   ESOPHAGOGASTRODUODENOSCOPY (EGD) WITH PROPOFOL  N/A 05/16/2023   Procedure: ESOPHAGOGASTRODUODENOSCOPY (EGD) WITH PROPOFOL ;  Surgeon: Asencion Blacksmith, MD;  Location: [facility];  Service: Gastroenterology;  Laterality: N/A;   ESOPHAGOGASTRODUODENOSCOPY (EGD) WITH PROPOFOL  N/A 07/21/2023   Procedure: ESOPHAGOGASTRODUODENOSCOPY (EGD) WITH PROPOFOL ;  Surgeon: Lajuan Pila, MD;  Location[facility] ENDOSCOPY;  Service: Gastroenterology;  Laterality: N/A;   EUS N/A 04/06/2017   Procedure: UPPER ENDOSCOPIC ULTRASOUND (EUS) LINEAR;  Surgeon: Janel Medford, MD;  Location: [facility];  Service: Endoscopy;  Laterality: N/A;  to evaluate  duodenal mass   HEMOSTASIS CLIP PLACEMENT  07/21/2023   Procedure: HEMOSTASIS CLIP PLACEMENT;  Surgeon: Lajuan Pila, MD;  Location: [facility] ENDOSCOPY;  Service: Gastroenterology;;   HEMOSTASIS CONTROL  07/21/2023   Procedure: HEMOSTASIS CONTROL;  Surgeon: Lajuan Pila, MD;  Location[facility];  Service: Gastroenterology;;   HERNIA REPAIR     IR ANGIOGRAM VISCERAL SELECTIVE  07/22/2023   IR BILIARY DRAIN PLACEMENT WITH CHOLANGIOGRAM  03/25/2017   IR CONVERT BILIARY DRAIN TO INT EXT BILIARY DRAIN  04/03/2017   IR EMBO ART  VEN HEMORR LYMPH EXTRAV  INC GUIDE ROADMAPPING  07/22/2023   IR US  GUIDE VASC ACCESS RIGHT  07/22/2023   POLYPECTOMY  05/16/2023   Procedure: POLYPECTOMY;  Surgeon: Asencion Blacksmith, MD;  Location: [facility] ENDOSCOPY;  Service: Gastroenterology;;     reports that she has quit smoking. Her smoking use included cigarettes. She has been exposed to tobacco smoke. She has quit using smokeless tobacco. She reports that she does not drink alcohol and does not use drugs.  Allergies  Allergen Reactions   Codeine Nausea And Vomiting    Made "very sick"   Tape     Tears skin - surgical tape    Family History  Problem Relation Age of Onset   Hypertension Mother    Prostate cancer Father    Colon cancer Father    Hypertension Brother    Rheum arthritis Maternal Grandmother    Diabetes Paternal Grandmother    Heart disease Paternal Grandfather     Prior to Admission medications   Medication Sig Start Date End Date Taking? Authorizing Provider  acetaminophen  (TYLENOL ) 325 MG tablet Take 650 mg by mouth every 6 (six) hours as needed for mild pain (pain score 1-3) or moderate pain (pain score 4-6).    [provider]  allopurinol  (ZYLOPRIM ) 300 MG tablet Take 1 tablet (300 mg total) by mouth daily. 07/18/23   Rothfuss, Jacob T, PA-C  amiodarone  (PACERONE ) 200 MG tablet Take 1 tablet (200 mg total) by mouth daily. 09/01/23   Croitoru, Mihai, MD  Ascorbic Acid (VITAMIN C CR) 500 MG  TBCR Take 500 mg by mouth daily.    [provider]  atorvastatin  (LIPITOR) 20 MG tablet Take 1 tablet (20 mg total) by mouth daily. 12/19/22   Clearnce Curia, NP  busPIRone  (BUSPAR ) 10 MG tablet TAKE 1 TABLET BY MOUTH TWICE A DAY 08/22/23   Rothfuss, Jacob T, PA-C  cholecalciferol  (VITAMIN D) 1000 units tablet Take 1,000 Units by mouth daily.    [provider]  cyanocobalamin  (VITAMIN B12) 1000 MCG tablet Take 1 tablet by mouth daily.    [provider]  DULoxetine  (CYMBALTA ) 20 MG capsule Take 1 capsule (20 mg total) by mouth daily. 09/29/23   Rothfuss, Jacob T, PA-C  ferrous sulfate  325 (65 FE) MG tablet  Take 325 mg by mouth daily with breakfast.    [provider]  folic acid  (FOLVITE ) 1 MG tablet Take 1 tablet (1 mg total) by mouth daily. 05/18/23   Maylene Spear, MD  KLOR-CON  M20 20 MEQ tablet Take 20 mEq by mouth daily.    [provider]  levothyroxine  (SYNTHROID ) 100 MCG tablet Take 1 tablet (100 mcg total) by mouth daily before breakfast. 07/18/23   Rothfuss, Jacob T, PA-C  melatonin 3 MG TABS tablet Take 1 tablet (3 mg total) by mouth at bedtime as needed (insomnia). 05/18/23   Krishnan, Gokul, MD  methocarbamol  (ROBAXIN ) 500 MG tablet TAKE 1 TABLET BY MOUTH EVERY 6 HOURS AS NEEDED FOR MUSCLE SPASMS. 10/03/23   Rothfuss, Jacob T, PA-C  ondansetron  (ZOFRAN ) 4 MG tablet Take 4 mg by mouth every 6 (six) hours as needed for nausea or vomiting.    [provider]  oxyCODONE  (OXY IR/ROXICODONE ) 5 MG immediate release tablet Take 5 mg by mouth as needed for severe pain (pain score 7-10).    [provider]  pantoprazole  (PROTONIX ) 40 MG tablet Take 1 tablet (40 mg total) by mouth daily. 07/18/23   Rothfuss, Jacob T, PA-C  torsemide  (DEMADEX ) 20 MG tablet Take Torsemide  on alternating days40 mg one day (2 tablets) then the next day 20 mg (1 tablet). 09/21/23   MadireddyDaymon Evans, MD     Physical Exam: Vitals:   10/21/23 1936  10/21/23 2115 10/22/23 0005 10/22/23 0120  BP: 131/61 (!) 117/90 (!) 147/87 134/72  Pulse: 72 72 86 80  Resp: (!) 24 (!) 23 (!) 27 18  Temp: 98.2 F (36.8 C)  97.8 F (36.6 C) 99.2 F (37.3 C)  TempSrc: Oral  Oral   SpO2: 98% 95% 91% 90%    Physical Exam Constitutional:      Appearance: She is obese. She is ill-appearing.  HENT:     Head: Normocephalic and atraumatic.     Mouth/Throat:     Mouth: Mucous membranes are moist.  Cardiovascular:     Rate and Rhythm: Normal rate and regular rhythm.     Pulses: Normal pulses.     Heart sounds: Normal heart sounds.  Pulmonary:     Effort: Pulmonary effort is normal.     Breath sounds: Normal breath sounds.  Abdominal:     Palpations: Abdomen is soft.  Musculoskeletal:        General: Tenderness present.     Cervical back: Neck supple.     Right lower leg: No edema.     Left lower leg: No edema.     Comments: Left-sided upper extremity tenderness  Skin:    Capillary Refill: Capillary refill takes less than 2 seconds.  Neurological:     Mental Status: She is alert and oriented to person, place, and time.  Psychiatric:        Mood and Affect: Mood normal.        Thought Content: Thought content normal.        Judgment: Judgment normal.      Labs on Admission: I have personally reviewed following labs and imaging studies  CBC: Recent Labs  Lab 10/21/23 2029  WBC 6.5  NEUTROABS 5.3  HGB 8.8*  HCT 29.4*  MCV 98.3  PLT 83*   Basic Metabolic Panel: Recent Labs  Lab 10/21/23 2029  NA 140  K 3.6  CL 102  CO2 34*  GLUCOSE 108*  BUN 17  CREATININE 1.11*  CALCIUM  8.6*  GFR: CrCl cannot be calculated (Unknown ideal weight.). Liver Function Tests: No results for input(s): "AST", "ALT", "ALKPHOS", "BILITOT", "PROT", "ALBUMIN" in the last 168 hours. No results for input(s): "LIPASE", "AMYLASE" in the last 168 hours. No results for input(s): "AMMONIA" in the last 168 hours. Coagulation Profile: No results for  input(s): "INR", "PROTIME" in the last 168 hours. Cardiac Enzymes: Recent Labs  Lab 10/21/23 2029  CKTOTAL 32*   BNP (last 3 results) No results for input(s): "BNP" in the last 8760 hours. HbA1C: No results for input(s): "HGBA1C" in the last 72 hours. CBG: No results for input(s): "GLUCAP" in the last 168 hours. Lipid Profile: No results for input(s): "CHOL", "HDL", "LDLCALC", "TRIG", "CHOLHDL", "LDLDIRECT" in the last 72 hours. Thyroid  Function Tests: No results for input(s): "TSH", "T4TOTAL", "FREET4", "T3FREE", "THYROIDAB" in the last 72 hours. Anemia Panel: No results for input(s): "VITAMINB12", "FOLATE", "FERRITIN", "TIBC", "IRON", "RETICCTPCT" in the last 72 hours. Urine analysis:    Component Value Date/Time   COLORURINE YELLOW 05/11/2023 1721   APPEARANCEUR CLOUDY (A) 05/11/2023 1721   LABSPEC 1.012 05/11/2023 1721   PHURINE 7.0 05/11/2023 1721   GLUCOSEU NEGATIVE 05/11/2023 1721   HGBUR LARGE (A) 05/11/2023 1721   BILIRUBINUR NEGATIVE 05/11/2023 1721   KETONESUR NEGATIVE 05/11/2023 1721   PROTEINUR NEGATIVE 05/11/2023 1721   NITRITE POSITIVE (A) 05/11/2023 1721   LEUKOCYTESUR MODERATE (A) 05/11/2023 1721    Radiological Exams on Admission: I have personally reviewed images CT Elbow Left Wo Contrast Result Date: 10/22/2023 CLINICAL DATA:  Fall, possible olecranon fracture EXAM: CT OF THE UPPER LEFT EXTREMITY WITHOUT CONTRAST TECHNIQUE: Multidetector CT imaging of the upper left extremity was performed according to the standard protocol. RADIATION DOSE REDUCTION: This exam was performed according to the departmental dose-optimization program which includes automated exposure control, adjustment of the mA and/or kV according to patient size and/or use of iterative reconstruction technique. COMPARISON:  Left elbow radiographs dated 10/21/2023 FINDINGS: No fracture or dislocation is seen. Specifically, the olecranon is intact. Visualized soft tissues are unremarkable.  No  elbow joint effusion. Mild subcutaneous stranding/hemorrhage in the posterior upper arm (series 4/image 1), incompletely visualized, likely related to the patient's known shoulder fracture. IMPRESSION: No fracture or dislocation is seen. Specifically, the olecranon is intact. Electronically Signed   By: Zadie Herter M.D.   On: 10/22/2023 01:00   CT PELVIS WO CONTRAST Result Date: 10/22/2023 CLINICAL DATA:  Fall EXAM: CT PELVIS WITHOUT CONTRAST TECHNIQUE: Multidetector CT imaging of the pelvis was performed following the standard protocol without intravenous contrast. RADIATION DOSE REDUCTION: This exam was performed according to the departmental dose-optimization program which includes automated exposure control, adjustment of the mA and/or kV according to patient size and/or use of iterative reconstruction technique. COMPARISON:  Left hip radiograph dated 10/21/2023 FINDINGS: Urinary Tract: Excretory contrast in the bladder, within normal limits. Bowel:  Grossly unremarkable. Vascular/Lymphatic: Atherosclerotic calcifications of the abdominal aorta and branch vessels. No suspicious pelvic lymphadenopathy. Reproductive:  Status post hysterectomy. Right ovary is within normal limits.  No left adnexal mass. Other:  No pelvic ascites. Musculoskeletal: No fracture or dislocation is seen. Specifically, the greater trochanter of the left hip appears intact. Visualized bony pelvis is intact. IMPRESSION: No fracture or dislocation is seen. Specifically, the greater trochanter of the left hip appears intact. Electronically Signed   By: Zadie Herter M.D.   On: 10/22/2023 00:57   CT Head Wo Contrast Result Date: 10/21/2023 CLINICAL DATA:  glf EXAM: CT HEAD WITHOUT CONTRAST CT CERVICAL  SPINE WITHOUT CONTRAST TECHNIQUE: Multidetector CT imaging of the head and cervical spine was performed following the standard protocol without intravenous contrast. Multiplanar CT image reconstructions of the cervical spine were  also generated. RADIATION DOSE REDUCTION: This exam was performed according to the departmental dose-optimization program which includes automated exposure control, adjustment of the mA and/or kV according to patient size and/or use of iterative reconstruction technique. COMPARISON:  CT head and max face 05/13/2023 FINDINGS: CT HEAD FINDINGS Brain: Patchy and confluent areas of decreased attenuation are noted throughout the deep and periventricular white matter of the cerebral hemispheres bilaterally, compatible with chronic microvascular ischemic disease. No evidence of large-territorial acute infarction. No parenchymal hemorrhage. No mass lesion. No extra-axial collection. No mass effect or midline shift. No hydrocephalus. Basilar cisterns are patent. Partially empty sella. Vascular: No hyperdense vessel. Atherosclerotic calcifications are present within the cavernous internal carotid and vertebral arteries. Skull: No acute fracture or focal lesion. Sinuses/Orbits: Bilateral ethmoid mucosal thickening. Otherwise paranasal sinuses and mastoid air cells are clear. The orbits are unremarkable. Other: None. CT CERVICAL SPINE FINDINGS Alignment: Normal. Skull base and vertebrae: No acute fracture. No aggressive appearing focal osseous lesion or focal pathologic process. Soft tissues and spinal canal: No prevertebral fluid or swelling. No visible canal hematoma. Upper chest: Unremarkable. Other: Atherosclerotic plaque of the aortic arch and its branches. Slightly heterogeneous nonenlarged thyroid  glands with associated calcifications-no further follow-up indicated. IMPRESSION: 1. No acute intracranial abnormality. 2. No acute displaced fracture or traumatic listhesis of the cervical spine. 3. Partial empty sella. Findings is often a normal anatomic variant but can be associated with idiopathic intracranial hypertension (pseudotumor cerebri). 4.  Aortic Atherosclerosis (ICD10-I70.0). Electronically Signed   By: Morgane   Naveau M.D.   On: 10/21/2023 22:12   CT Cervical Spine Wo Contrast Result Date: 10/21/2023 CLINICAL DATA:  glf EXAM: CT HEAD WITHOUT CONTRAST CT CERVICAL SPINE WITHOUT CONTRAST TECHNIQUE: Multidetector CT imaging of the head and cervical spine was performed following the standard protocol without intravenous contrast. Multiplanar CT image reconstructions of the cervical spine were also generated. RADIATION DOSE REDUCTION: This exam was performed according to the departmental dose-optimization program which includes automated exposure control, adjustment of the mA and/or kV according to patient size and/or use of iterative reconstruction technique. COMPARISON:  CT head and max face 05/13/2023 FINDINGS: CT HEAD FINDINGS Brain: Patchy and confluent areas of decreased attenuation are noted throughout the deep and periventricular white matter of the cerebral hemispheres bilaterally, compatible with chronic microvascular ischemic disease. No evidence of large-territorial acute infarction. No parenchymal hemorrhage. No mass lesion. No extra-axial collection. No mass effect or midline shift. No hydrocephalus. Basilar cisterns are patent. Partially empty sella. Vascular: No hyperdense vessel. Atherosclerotic calcifications are present within the cavernous internal carotid and vertebral arteries. Skull: No acute fracture or focal lesion. Sinuses/Orbits: Bilateral ethmoid mucosal thickening. Otherwise paranasal sinuses and mastoid air cells are clear. The orbits are unremarkable. Other: None. CT CERVICAL SPINE FINDINGS Alignment: Normal. Skull base and vertebrae: No acute fracture. No aggressive appearing focal osseous lesion or focal pathologic process. Soft tissues and spinal canal: No prevertebral fluid or swelling. No visible canal hematoma. Upper chest: Unremarkable. Other: Atherosclerotic plaque of the aortic arch and its branches. Slightly heterogeneous nonenlarged thyroid  glands with associated calcifications-no  further follow-up indicated. IMPRESSION: 1. No acute intracranial abnormality. 2. No acute displaced fracture or traumatic listhesis of the cervical spine. 3. Partial empty sella. Findings is often a normal anatomic variant but can be associated with idiopathic intracranial  hypertension (pseudotumor cerebri). 4.  Aortic Atherosclerosis (ICD10-I70.0). Electronically Signed   By: Morgane  Naveau M.D.   On: 10/21/2023 22:12   DG Knee 2 Views Left Result Date: 10/21/2023 CLINICAL DATA:  GLF onto shoulder EXAM: LEFT KNEE - 1-2 VIEW COMPARISON:  X-ray left knee 05/11/2023 FINDINGS: No evidence of fracture, dislocation, or joint effusion. No evidence of arthropathy or other focal bone abnormality. Soft tissues are unremarkable. Vascular calcification. IMPRESSION: No acute displaced fracture or dislocation. Electronically Signed   By: Morgane  Naveau M.D.   On: 10/21/2023 21:54   DG Hip Unilat W or Wo Pelvis 2-3 Views Left Result Date: 10/21/2023 CLINICAL DATA:  GLF onto shoulder EXAM: DG HIP (WITH OR WITHOUT PELVIS) 2-3V LEFT COMPARISON:  CT abdomen pelvis 07/20/2023 FINDINGS: Limited evaluation due to overlapping osseous structures and overlying soft tissues. Cortical irregularity along the left greater trochanter with acute nondisplaced fracture not excluded. No left hip dislocation. No acute displaced fracture or dislocation of the right hip on frontal view. No acute displaced fracture or diastasis of the bones of the pelvis. There is no evidence of hip fracture or dislocation. There is no evidence of arthropathy or other focal bone abnormality. Left hip subcutaneus soft tissue edema. Vascular calcification. IMPRESSION: Cortical irregularity along the left greater trochanter with acute nondisplaced fracture not excluded. Correlate with point tenderness to palpation. Electronically Signed   By: Morgane  Naveau M.D.   On: 10/21/2023 21:53   DG Elbow 2 Views Left Result Date: 10/21/2023 CLINICAL DATA:  GLF onto  shoulder EXAM: LEFT ELBOW - 2 VIEW COMPARISON:  None Available. FINDINGS: Slightly limited evaluation due to nonstandard lateral view. There is no evidence of definite acute displaced fracture or dislocation. Vague cortical irregularity along the olecranon coronoid process. Limited evaluation for joint effusion there is no evidence of arthropathy or other focal bone abnormality. Soft tissues are unremarkable. IMPRESSION: Slightly limited evaluation due to nonstandard lateral view. Vague cortical irregularity along the olecranon coronoid process. Finding could represent an avulsion fracture. Electronically Signed   By: Morgane  Naveau M.D.   On: 10/21/2023 21:50   DG Humerus Left Result Date: 10/21/2023 CLINICAL DATA:  GLF onto shoulder EXAM: LEFT HUMERUS - 2+ VIEW COMPARISON:  X-ray left shoulder 10/21/2023. FINDINGS: Redemonstration of proximal left humeral fracture better evaluated on x-ray left shoulder 10/21/2023. There is no evidence of other humeral fracture or other focal bone lesions. Elbow is grossly unremarkable. Soft tissues are unremarkable. IMPRESSION: 1. Redemonstration of proximal left humeral fracture better evaluated on x-ray left shoulder 10/21/2023. 2. No other acute humeral fracture. Electronically Signed   By: Morgane  Naveau M.D.   On: 10/21/2023 21:48   DG Shoulder Left Result Date: 10/21/2023 CLINICAL DATA:  GLF onto shoulder EXAM: LEFT SHOULDER - 2+ VIEW COMPARISON:  CT chest 03/29/2017 FINDINGS: No dislocation. Acute displaced and comminuted impacted left humeral head and neck fracture. Mild acromioclavicular joint and at least mild glenohumeral joint degenerative changes. No aggressive appearing osseous lesion. Subcutaneus soft tissue edema IMPRESSION: Acute displaced and comminuted impacted left humeral head and neck fracture. Electronically Signed   By: Morgane  Naveau M.D.   On: 10/21/2023 21:48    Assessment/Plan: Principal Problem:   Fall at home, initial encounter Active  Problems:   Left humeral fracture   Left elbow avulsion fracture   Duodenal adenocarcinoma (HCC)   CKD (chronic kidney disease) stage 3, GFR 30-59 ml/min (HCC)   Essential hypertension   Chronic diastolic CHF (congestive heart failure) (HCC)   History  of DVT (deep vein thrombosis)   Paroxysmal atrial fibrillation (HCC)   AAA (abdominal aortic aneurysm) (HCC)   Hyperlipidemia   GAD (generalized anxiety disorder)   Hypothyroidism   History of GI bleed   History of depression    Assessment and Plan: Mechanical fall at home Left humeral head fracture Left elbow avulsion fracture -Patient has been presented to emergency department after a mechanical fall at home while losing balance when she tried to get out of the wheelchair to bed.  Reported hitting of the head.  Since the fall patient has left-sided elbow and left-sided knee joint pain. - X-ray of the shoulder showed acute displaced communicated left femoral head and neck fracture. - X-ray of the left elbow showed avulsion fracture. - X-ray of the left hip showed left greater trochanter questionable fracture? - CT head and CT cervical spine unremarkable - CT scan of the elbow no evidence of fracture - CT scan of the pelvis no evidence of fracture or dislocation. -ED physician consulted orthopedist Dr. Charol Copas recommended sling placement, not weightbearing of the left upper extremity.  Dr. Charol Copas recommended f/u with Dr. Hiram Lukes in 5-7 days post dc. Call [phone] to schedule.  -Left-sided shoulder sling has been placed.  Continue nonweightbearing of the left upper extremity. -Continue pain control. -Continue fall precaution. -At baseline patient uses wheelchair.  Consulted inpatient PT and OT.   History of GI bleed New diagnosis of gastric adenocarcinoma -Patient has history of hemorrhagic shock and recurrent GI bleed.  She underwent CT abdomen pelvis 4/25 which showed duodenal lesion. 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 -Surgical oncology has consulted while at [facility].  Pending further recommendation - Patient has been managed with IV Protonix  twice daily recommended to continue PPI twice daily on discharge.   Essential hypertension Chronic diastolic heart failure preserved EF 60 to 65% -Pending medication reconciliation - Per chart review of discharge summary from [facility] patient supposed to be on Cardizem  240 mg daily daily along with chlorthalidone , losartan , both Lasix  and torsemide ?.  Currently blood pressure is borderline soft and heart rate around 80. - Starting Cardizem  on 120 mg daily and chlorthalidone  25 mg daily.  Continuing torsemide .  If blood pressure continue to trend can resume losartan .  If develops tachycardia can increase the dose of Cardizem  to 240 mg.   Paroxysmal atrial fibrillation - Continue Cardizem  120 mg daily and amiodarone  200 mg daily.  Based on the heart rate trend can increase the dose of Cardizem  to 240 mg. -History of recent GI bleed and hemorrhagic shock.  Per chart review of the [facility] oncology surgeon note recommended to hold Eliquis  10 days starting from 10/19/2023.  History of DVT - Eliquis  on hold for 10 days starting from 4/24  Hyperlipidemia -Continue Lipitor  Hypothyroidism -Continue levothyroxine   Generalized anxiety disorder History of depression -Continue Cymbalta , Zoloft  and BuSpar   CKD stage III 3a -Creatinine 1.1 and GFR 51.  Renal function at baseline.  Gout -Continue allopurinol .  Reactive airway disease -Continue albuterol  as needed  Anemia of chronic disease Stable H&H baseline.  Continue iron supplement, folic acid  and B12   DVT prophylaxis:  SCDs.  Deferring pharmacological prophylaxis in the setting of recent EGD/duodenal biopsy and GI bleed.  Eliquis  need to be hold for 10 days starting from 10/19/23 Code Status:  Full Code Diet: Heart healthy diet Family Communication:    Family was present at bedside, at the time of interview. Opportunity was given to ask  question and all questions were answered satisfactorily.  Disposition Plan: Continue pain control, PT and OT evaluation. Consults: Orthopedics Admission status:   Observation, Telemetry bed  Severity of Illness: The appropriate patient status for this patient is OBSERVATION. Observation status is judged to be reasonable and necessary in order to provide the required intensity of service to ensure the patient's safety. The patient's presenting symptoms, physical exam findings, and initial radiographic and laboratory data in the context of their medical condition is felt to place them at decreased risk for further clinical deterioration. Furthermore, it is anticipated that the patient will be medically stable for discharge from the hospital within 2 midnights of admission.     Lajune Perine, MD [facility]  How to contact the [facility] Attending or Consulting provider 7A - 7P or covering provider during after hours 7P -7A, for this patient.  Check the care team in [facility] and look for a) attending/consulting [facility] provider listed and b) the [facility] team listed Log into [email or web address] and use [place]'s universal password to access. If you do not have the password, please contact the hospital operator. Locate the [facility] provider you are looking for under [facility] and page to a number that you can be directly reached. If you still have difficulty reaching the provider, please page the [facility] (Director on Call) for the Hospitalists listed on amion for assistance.  10/22/2023, 1:20 AM

## 2023-10-21 NOTE — ED Provider Notes (Signed)
 [place] EMERGENCY DEPARTMENT AT [facility] Provider Note   CSN: [id number] Arrival date & time: 10/21/23  1942     History  Chief Complaint  Patient presents with   Fall   HPI  Peggy Armstrong is a 78 y.o. female with past medical history HFpEF, A-fib off of Eliquis  for 6 days now, AAA, hypertension, GERD, GI bleed s/p duodenal intervention at [facility], discharged yesterday presents for evaluation after fall.  Patient was standing up from her wheelchair when she lost her balance and fell to her left side.  She told EMS she hit her head, but now does not remember.  She endorses pain to her left shoulder, as well as her left knee.  She was on the ground for at least 1 hour before family assisted her to her bed.  She endorses mild headache at this time as well.   Fall      Home Medications Prior to Admission medications   Medication Sig Start Date End Date Taking? Authorizing Provider  acetaminophen  (TYLENOL ) 325 MG tablet Take 650 mg by mouth every 6 (six) hours as needed for mild pain (pain score 1-3) or moderate pain (pain score 4-6).    [provider]  allopurinol  (ZYLOPRIM ) 300 MG tablet Take 1 tablet (300 mg total) by mouth daily. 07/18/23   Rothfuss, Jacob T, PA-C  amiodarone  (PACERONE ) 200 MG tablet Take 1 tablet (200 mg total) by mouth daily. 09/01/23   Croitoru, Mihai, MD  Ascorbic Acid (VITAMIN C CR) 500 MG TBCR Take 500 mg by mouth daily.    [provider]  atorvastatin  (LIPITOR) 20 MG tablet Take 1 tablet (20 mg total) by mouth daily. 12/19/22   Clearnce Curia, NP  busPIRone  (BUSPAR ) 10 MG tablet TAKE 1 TABLET BY MOUTH TWICE A DAY 08/22/23   Rothfuss, Jacob T, PA-C  cholecalciferol  (VITAMIN D) 1000 units tablet Take 1,000 Units by mouth daily.    [provider]  cyanocobalamin  (VITAMIN B12) 1000 MCG tablet Take 1 tablet by mouth daily.    [provider]  DULoxetine  (CYMBALTA ) 20 MG capsule Take 1 capsule (20 mg total) by  mouth daily. 09/29/23   Rothfuss, Jacob T, PA-C  ferrous sulfate  325 (65 FE) MG tablet Take 325 mg by mouth daily with breakfast.    [provider]  folic acid  (FOLVITE ) 1 MG tablet Take 1 tablet (1 mg total) by mouth daily. 05/18/23   Krishnan, Gokul, MD  KLOR-CON  M20 20 MEQ tablet Take 20 mEq by mouth daily.    [provider]  levothyroxine  (SYNTHROID ) 100 MCG tablet Take 1 tablet (100 mcg total) by mouth daily before breakfast. 07/18/23   Rothfuss, Jacob T, PA-C  melatonin 3 MG TABS tablet Take 1 tablet (3 mg total) by mouth at bedtime as needed (insomnia). 05/18/23   Krishnan, Gokul, MD  methocarbamol  (ROBAXIN ) 500 MG tablet TAKE 1 TABLET BY MOUTH EVERY 6 HOURS AS NEEDED FOR MUSCLE SPASMS. 10/03/23   Rothfuss, Jacob T, PA-C  ondansetron  (ZOFRAN ) 4 MG tablet Take 4 mg by mouth every 6 (six) hours as needed for nausea or vomiting.    [provider]  oxyCODONE  (OXY IR/ROXICODONE ) 5 MG immediate release tablet Take 5 mg by mouth as needed for severe pain (pain score 7-10).    [provider]  pantoprazole  (PROTONIX ) 40 MG tablet Take 1 tablet (40 mg total) by mouth daily. 07/18/23   Rothfuss, Jacob T, PA-C  torsemide  (DEMADEX ) 20 MG tablet  Take Torsemide  on alternating days40 mg one day (2 tablets) then the next day 20 mg (1 tablet). 09/21/23   Madireddy, Daymon Evans, MD      Allergies    Codeine and Tape    Review of Systems   Review of Systems  Physical Exam Updated Vital Signs BP (!) 117/90   Pulse 72   Temp 98.2 F (36.8 C) (Oral)   Resp (!) 23   SpO2 95%  Physical Exam Vitals and nursing note reviewed.  Constitutional:      General: She is not in acute distress.    Appearance: She is well-developed.  HENT:     Head: Normocephalic and atraumatic.     Right Ear: External ear normal.     Left Ear: External ear normal.     Nose: Nose normal.     Mouth/Throat:     Mouth: Mucous membranes are moist.  Eyes:     Extraocular Movements: Extraocular  movements intact.     Conjunctiva/sclera: Conjunctivae normal.  Neck:     Comments: No C spine tenderness Cardiovascular:     Rate and Rhythm: Normal rate and regular rhythm.     Heart sounds: No murmur heard. Pulmonary:     Effort: Pulmonary effort is normal. No respiratory distress.     Breath sounds: Normal breath sounds.  Abdominal:     Palpations: Abdomen is soft.     Tenderness: There is no abdominal tenderness. There is no guarding or rebound.  Musculoskeletal:     Cervical back: Normal range of motion and neck supple.     Comments: Ecchymosis to the left proximal arm and pain to palpation of the left anterior shoulder, no obvious deformity.  Able to flex and extend elbow, 2+ radial pulses. Pelvis stable to lateral compression Bruising noted to the left knee, able to flex and extend without difficulty.  Skin:    General: Skin is warm and dry.     Capillary Refill: Capillary refill takes less than 2 seconds.  Neurological:     Mental Status: She is alert.  Psychiatric:        Mood and Affect: Mood normal.     ED Results / Procedures / Treatments   Labs (all labs ordered are listed, but only abnormal results are displayed) Labs Reviewed  CBC WITH DIFFERENTIAL/PLATELET - Abnormal; Notable for the following components:      Result Value   RBC 2.99 (*)    Hemoglobin 8.8 (*)    HCT 29.4 (*)    MCHC 29.9 (*)    RDW 15.9 (*)    Platelets 83 (*)    Lymphs Abs 0.5 (*)    All other components within normal limits  BASIC METABOLIC PANEL WITH GFR - Abnormal; Notable for the following components:   CO2 34 (*)    Glucose, Bld 108 (*)    Creatinine, Ser 1.11 (*)    Calcium  8.6 (*)    GFR, Estimated 51 (*)    Anion gap 4 (*)    All other components within normal limits  CK - Abnormal; Notable for the following components:   Total CK 32 (*)    All other components within normal limits  COMPREHENSIVE METABOLIC PANEL WITH GFR  CBC  PROTIME-INR  APTT     EKG None  Radiology CT Head Wo Contrast Result Date: 10/21/2023 CLINICAL DATA:  glf EXAM: CT HEAD WITHOUT CONTRAST CT CERVICAL SPINE WITHOUT CONTRAST TECHNIQUE: Multidetector CT imaging of the head and  cervical spine was performed following the standard protocol without intravenous contrast. Multiplanar CT image reconstructions of the cervical spine were also generated. RADIATION DOSE REDUCTION: This exam was performed according to the departmental dose-optimization program which includes automated exposure control, adjustment of the mA and/or kV according to patient size and/or use of iterative reconstruction technique. COMPARISON:  CT head and max face 05/13/2023 FINDINGS: CT HEAD FINDINGS Brain: Patchy and confluent areas of decreased attenuation are noted throughout the deep and periventricular white matter of the cerebral hemispheres bilaterally, compatible with chronic microvascular ischemic disease. No evidence of large-territorial acute infarction. No parenchymal hemorrhage. No mass lesion. No extra-axial collection. No mass effect or midline shift. No hydrocephalus. Basilar cisterns are patent. Partially empty sella. Vascular: No hyperdense vessel. Atherosclerotic calcifications are present within the cavernous internal carotid and vertebral arteries. Skull: No acute fracture or focal lesion. Sinuses/Orbits: Bilateral ethmoid mucosal thickening. Otherwise paranasal sinuses and mastoid air cells are clear. The orbits are unremarkable. Other: None. CT CERVICAL SPINE FINDINGS Alignment: Normal. Skull base and vertebrae: No acute fracture. No aggressive appearing focal osseous lesion or focal pathologic process. Soft tissues and spinal canal: No prevertebral fluid or swelling. No visible canal hematoma. Upper chest: Unremarkable. Other: Atherosclerotic plaque of the aortic arch and its branches. Slightly heterogeneous nonenlarged thyroid  glands with associated calcifications-no further follow-up  indicated. IMPRESSION: 1. No acute intracranial abnormality. 2. No acute displaced fracture or traumatic listhesis of the cervical spine. 3. Partial empty sella. Findings is often a normal anatomic variant but can be associated with idiopathic intracranial hypertension (pseudotumor cerebri). 4.  Aortic Atherosclerosis (ICD10-I70.0). Electronically Signed   By: Morgane  Naveau M.D.   On: 10/21/2023 22:12   CT Cervical Spine Wo Contrast Result Date: 10/21/2023 CLINICAL DATA:  glf EXAM: CT HEAD WITHOUT CONTRAST CT CERVICAL SPINE WITHOUT CONTRAST TECHNIQUE: Multidetector CT imaging of the head and cervical spine was performed following the standard protocol without intravenous contrast. Multiplanar CT image reconstructions of the cervical spine were also generated. RADIATION DOSE REDUCTION: This exam was performed according to the departmental dose-optimization program which includes automated exposure control, adjustment of the mA and/or kV according to patient size and/or use of iterative reconstruction technique. COMPARISON:  CT head and max face 05/13/2023 FINDINGS: CT HEAD FINDINGS Brain: Patchy and confluent areas of decreased attenuation are noted throughout the deep and periventricular white matter of the cerebral hemispheres bilaterally, compatible with chronic microvascular ischemic disease. No evidence of large-territorial acute infarction. No parenchymal hemorrhage. No mass lesion. No extra-axial collection. No mass effect or midline shift. No hydrocephalus. Basilar cisterns are patent. Partially empty sella. Vascular: No hyperdense vessel. Atherosclerotic calcifications are present within the cavernous internal carotid and vertebral arteries. Skull: No acute fracture or focal lesion. Sinuses/Orbits: Bilateral ethmoid mucosal thickening. Otherwise paranasal sinuses and mastoid air cells are clear. The orbits are unremarkable. Other: None. CT CERVICAL SPINE FINDINGS Alignment: Normal. Skull base and  vertebrae: No acute fracture. No aggressive appearing focal osseous lesion or focal pathologic process. Soft tissues and spinal canal: No prevertebral fluid or swelling. No visible canal hematoma. Upper chest: Unremarkable. Other: Atherosclerotic plaque of the aortic arch and its branches. Slightly heterogeneous nonenlarged thyroid  glands with associated calcifications-no further follow-up indicated. IMPRESSION: 1. No acute intracranial abnormality. 2. No acute displaced fracture or traumatic listhesis of the cervical spine. 3. Partial empty sella. Findings is often a normal anatomic variant but can be associated with idiopathic intracranial hypertension (pseudotumor cerebri). 4.  Aortic Atherosclerosis (ICD10-I70.0). Electronically Signed  By: Morgane  Naveau M.D.   On: 10/21/2023 22:12   DG Knee 2 Views Left Result Date: 10/21/2023 CLINICAL DATA:  GLF onto shoulder EXAM: LEFT KNEE - 1-2 VIEW COMPARISON:  X-ray left knee 05/11/2023 FINDINGS: No evidence of fracture, dislocation, or joint effusion. No evidence of arthropathy or other focal bone abnormality. Soft tissues are unremarkable. Vascular calcification. IMPRESSION: No acute displaced fracture or dislocation. Electronically Signed   By: Morgane  Naveau M.D.   On: 10/21/2023 21:54   DG Hip Unilat W or Wo Pelvis 2-3 Views Left Result Date: 10/21/2023 CLINICAL DATA:  GLF onto shoulder EXAM: DG HIP (WITH OR WITHOUT PELVIS) 2-3V LEFT COMPARISON:  CT abdomen pelvis 07/20/2023 FINDINGS: Limited evaluation due to overlapping osseous structures and overlying soft tissues. Cortical irregularity along the left greater trochanter with acute nondisplaced fracture not excluded. No left hip dislocation. No acute displaced fracture or dislocation of the right hip on frontal view. No acute displaced fracture or diastasis of the bones of the pelvis. There is no evidence of hip fracture or dislocation. There is no evidence of arthropathy or other focal bone abnormality.  Left hip subcutaneus soft tissue edema. Vascular calcification. IMPRESSION: Cortical irregularity along the left greater trochanter with acute nondisplaced fracture not excluded. Correlate with point tenderness to palpation. Electronically Signed   By: Morgane  Naveau M.D.   On: 10/21/2023 21:53   DG Elbow 2 Views Left Result Date: 10/21/2023 CLINICAL DATA:  GLF onto shoulder EXAM: LEFT ELBOW - 2 VIEW COMPARISON:  None Available. FINDINGS: Slightly limited evaluation due to nonstandard lateral view. There is no evidence of definite acute displaced fracture or dislocation. Vague cortical irregularity along the olecranon coronoid process. Limited evaluation for joint effusion there is no evidence of arthropathy or other focal bone abnormality. Soft tissues are unremarkable. IMPRESSION: Slightly limited evaluation due to nonstandard lateral view. Vague cortical irregularity along the olecranon coronoid process. Finding could represent an avulsion fracture. Electronically Signed   By: Morgane  Naveau M.D.   On: 10/21/2023 21:50   DG Humerus Left Result Date: 10/21/2023 CLINICAL DATA:  GLF onto shoulder EXAM: LEFT HUMERUS - 2+ VIEW COMPARISON:  X-ray left shoulder 10/21/2023. FINDINGS: Redemonstration of proximal left humeral fracture better evaluated on x-ray left shoulder 10/21/2023. There is no evidence of other humeral fracture or other focal bone lesions. Elbow is grossly unremarkable. Soft tissues are unremarkable. IMPRESSION: 1. Redemonstration of proximal left humeral fracture better evaluated on x-ray left shoulder 10/21/2023. 2. No other acute humeral fracture. Electronically Signed   By: Morgane  Naveau M.D.   On: 10/21/2023 21:48   DG Shoulder Left Result Date: 10/21/2023 CLINICAL DATA:  GLF onto shoulder EXAM: LEFT SHOULDER - 2+ VIEW COMPARISON:  CT chest 03/29/2017 FINDINGS: No dislocation. Acute displaced and comminuted impacted left humeral head and neck fracture. Mild acromioclavicular joint and  at least mild glenohumeral joint degenerative changes. No aggressive appearing osseous lesion. Subcutaneus soft tissue edema IMPRESSION: Acute displaced and comminuted impacted left humeral head and neck fracture. Electronically Signed   By: Morgane  Naveau M.D.   On: 10/21/2023 21:48    Procedures Procedures    Medications Ordered in ED Medications  fentaNYL  (SUBLIMAZE ) injection 50 mcg (has no administration in time range)  allopurinol  (ZYLOPRIM ) tablet 300 mg (has no administration in time range)  oxyCODONE  (Oxy IR/ROXICODONE ) immediate release tablet 5 mg (has no administration in time range)  amiodarone  (PACERONE ) tablet 200 mg (has no administration in time range)  atorvastatin  (LIPITOR) tablet 20 mg (has  no administration in time range)  torsemide  (DEMADEX ) tablet 20 mg (has no administration in time range)  busPIRone  (BUSPAR ) tablet 10 mg (has no administration in time range)  DULoxetine  (CYMBALTA ) DR capsule 20 mg (has no administration in time range)  levothyroxine  (SYNTHROID ) tablet 100 mcg (has no administration in time range)  pantoprazole  (PROTONIX ) EC tablet 40 mg (has no administration in time range)  ferrous sulfate  tablet 325 mg (has no administration in time range)  folic acid  (FOLVITE ) tablet 1 mg (has no administration in time range)  cyanocobalamin  (VITAMIN B12) tablet 1,000 mcg (has no administration in time range)  melatonin tablet 3 mg (has no administration in time range)  methocarbamol  (ROBAXIN ) tablet 500 mg (has no administration in time range)  sodium chloride  flush (NS) 0.9 % injection 3 mL (has no administration in time range)  sodium chloride  flush (NS) 0.9 % injection 3 mL (has no administration in time range)  0.9 %  sodium chloride  infusion (has no administration in time range)  acetaminophen  (TYLENOL ) tablet 650 mg (has no administration in time range)    Or  acetaminophen  (TYLENOL ) suppository 650 mg (has no administration in time range)  fentaNYL   (SUBLIMAZE ) injection 12.5-25 mcg (has no administration in time range)  ondansetron  (ZOFRAN ) tablet 4 mg (has no administration in time range)    Or  ondansetron  (ZOFRAN ) injection 4 mg (has no administration in time range)  fentaNYL  (SUBLIMAZE ) injection 50 mcg (50 mcg Intravenous Given 10/21/23 2036)    ED Course/ Medical Decision Making/ A&P Clinical Course as of 10/21/23 2354  Sat Oct 21, 2023  2215 CT Cervical Spine Wo Contrast [AO]    Clinical Course User Index [AO] Lorain Robson, MD                                 Medical Decision Making Amount and/or Complexity of Data Reviewed Labs: ordered. Radiology: ordered. Decision-making details documented in ED Course.  Risk Prescription drug management.   Patient is alert, afebrile, and hemodynamically stable in no acute distress.  Physical exam as noted above.  Will obtain x-ray imaging of the left shoulder and arm given bruising with differential including fracture, contusion, dislocation.  Will also obtain x-ray imaging of the left lower extremity and left hip.  Given uncertainty of any head trauma, will obtain CT imaging of head and C-spine.  Patient was given fentanyl  for pain control.  Workup resulted with CBC of 8.8, platelet count 83, BMP stable from prior.  CK 32.  X-ray of the left humerus and shoulder demonstrate proximal humerus fracture.  Left elbow x-ray with possible cortical irregularity that could be an avulsion fracture.  Left knee x-ray unremarkable.  Left hip x-ray with potential greater trochanteric fracture.  Will obtain CT imaging of the left hip and left elbow given equivocal findings.  CT head and C-spine without any acute findings.  Given patient's recent discharge from the hospital with noted thrombocytopenia, in addition to fracture with limited mobility and significant pain, patient requires admission for further monitoring and management.  She was placed in a sling, and orthopedic surgery was consulted at  the request of admitting physician. Dr. Charol Copas states that patient will be NWB to left arm, sling, and outpatient follow-up in one week.   Patient seen in conjunction with Dr. Isaiah Marc, who agreed with the above work-up and plan of care.         Final Clinical  Impression(s) / ED Diagnoses Final diagnoses:  Closed fracture of proximal end of left humerus, unspecified fracture morphology, initial encounter    Rx / DC Orders ED Discharge Orders     None         Lorain Robson, MD 10/21/23 2354    Mordecai Applebaum, MD 10/22/23 1205

## 2023-10-21 NOTE — Progress Notes (Signed)
 Pt fell out of wheelchair. Currently in CT scanner for ? L greater trochanteric avulsion fx. Consult for closed L proximal humerus fx. Plan for sling NWB LUE. F/u with Dr. Hiram Lukes in 5-7 days post dc. Call [phone] to schedule.   ADDENDUM: CT pelvis negative for greater trochanter fx.

## 2023-10-21 NOTE — ED Notes (Signed)
 Pt taken to CT.

## 2023-10-21 NOTE — Plan of Care (Addendum)
 Peggy Armstrong is a 78 y.o. female with medical history significant of HFrEF, paroxysmal atrial fibrillation on Eliquis , AAA, essential hypertension [facility], history of recurrent GI bleed, rheumatoid arthritis, CKD stage IIIa, depression, anxiety, hypothyroidism, DVT, hyperlipidemia.  Patient has been recently hospitalized to [facility] discharged on 4/24 2025.  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   Per chart review of [facility] general surgery note recommended to hold  Eliquis  14 days starting from 10/19/2023.   Patient presented to emergency department today for evaluation for fall.  She reported that when she stood up from the wheelchair lost balance and fell on the left side.  Reported hitting of the head.  Since the fall endorsing left-sided shoulder and knee pain.  Reported she was on the ground for almost 1 hour before family assisted her to bed.  At presentation to ED patient is hemodynamically stable CBC showing stable H&H 8.8 and 29.  Platelet count 83 which is around baseline.  Normal WBC count. BMP showing creatinine 1.1 GFR 51.  Renal function at baseline.  Extensive imaging of the ED following: X-ray of the left shoulder showed: Acute displaced and comminuted impacted left humeral head and neck fracture.  X-ray of the left elbow: Slightly limited evaluation due to nonstandard lateral view. Vague cortical irregularity along the olecranon coronoid process. Finding could represent an avulsion fracture.  X-ray of the hip: Cortical irregularity along the left greater trochanter with acute nondisplaced fracture not excluded. Correlate with point tenderness to palpation.  X-ray of the left knee no fracture or dislocation.  CT head and CT cervical  spine: No evidence of acute intracranial abnormality.  No acute displaced cervical fracture.Partial empty sella. Findings is often a normal anatomic variant but can be associated with idiopathic intracranial hypertension (pseudotumor cerebri).   In the setting of humeral fracture, possible left-sided hip fracture and elbow fracture requested ED physician to consult orthopedics for recommendation. Also pending CT elbow and CT pelvis.  Informed ED physician to call back after recommendation from orthopedics.  Deferring admission at this time.

## 2023-10-21 NOTE — ED Notes (Signed)
 Xray at bedside; ice provided

## 2023-10-22 ENCOUNTER — Other Ambulatory Visit (HOSPITAL_BASED_OUTPATIENT_CLINIC_OR_DEPARTMENT_OTHER): Payer: Self-pay | Admitting: Student

## 2023-10-22 DIAGNOSIS — D500 Iron deficiency anemia secondary to blood loss (chronic): Secondary | ICD-10-CM | POA: Diagnosis not present

## 2023-10-22 DIAGNOSIS — I10 Essential (primary) hypertension: Secondary | ICD-10-CM | POA: Diagnosis not present

## 2023-10-22 DIAGNOSIS — E782 Mixed hyperlipidemia: Secondary | ICD-10-CM | POA: Diagnosis not present

## 2023-10-22 DIAGNOSIS — W19XXXA Unspecified fall, initial encounter: Secondary | ICD-10-CM | POA: Diagnosis not present

## 2023-10-22 DIAGNOSIS — S0990XA Unspecified injury of head, initial encounter: Secondary | ICD-10-CM | POA: Diagnosis present

## 2023-10-22 DIAGNOSIS — E039 Hypothyroidism, unspecified: Secondary | ICD-10-CM | POA: Diagnosis present

## 2023-10-22 DIAGNOSIS — I5032 Chronic diastolic (congestive) heart failure: Secondary | ICD-10-CM | POA: Diagnosis present

## 2023-10-22 DIAGNOSIS — E876 Hypokalemia: Secondary | ICD-10-CM | POA: Diagnosis present

## 2023-10-22 DIAGNOSIS — S42432D Displaced fracture (avulsion) of lateral epicondyle of left humerus, subsequent encounter for fracture with routine healing: Secondary | ICD-10-CM | POA: Diagnosis not present

## 2023-10-22 DIAGNOSIS — F32A Depression, unspecified: Secondary | ICD-10-CM | POA: Diagnosis present

## 2023-10-22 DIAGNOSIS — D638 Anemia in other chronic diseases classified elsewhere: Secondary | ICD-10-CM | POA: Diagnosis not present

## 2023-10-22 DIAGNOSIS — I482 Chronic atrial fibrillation, unspecified: Secondary | ICD-10-CM | POA: Diagnosis not present

## 2023-10-22 DIAGNOSIS — M109 Gout, unspecified: Secondary | ICD-10-CM | POA: Diagnosis present

## 2023-10-22 DIAGNOSIS — C170 Malignant neoplasm of duodenum: Secondary | ICD-10-CM | POA: Diagnosis present

## 2023-10-22 DIAGNOSIS — I480 Paroxysmal atrial fibrillation: Secondary | ICD-10-CM | POA: Diagnosis present

## 2023-10-22 DIAGNOSIS — S42292A Other displaced fracture of upper end of left humerus, initial encounter for closed fracture: Secondary | ICD-10-CM | POA: Diagnosis present

## 2023-10-22 DIAGNOSIS — M6281 Muscle weakness (generalized): Secondary | ICD-10-CM | POA: Diagnosis not present

## 2023-10-22 DIAGNOSIS — R531 Weakness: Secondary | ICD-10-CM | POA: Diagnosis not present

## 2023-10-22 DIAGNOSIS — Z7409 Other reduced mobility: Secondary | ICD-10-CM | POA: Diagnosis not present

## 2023-10-22 DIAGNOSIS — J45909 Unspecified asthma, uncomplicated: Secondary | ICD-10-CM | POA: Diagnosis present

## 2023-10-22 DIAGNOSIS — N1831 Chronic kidney disease, stage 3a: Secondary | ICD-10-CM | POA: Diagnosis present

## 2023-10-22 DIAGNOSIS — R0902 Hypoxemia: Secondary | ICD-10-CM | POA: Diagnosis not present

## 2023-10-22 DIAGNOSIS — C169 Malignant neoplasm of stomach, unspecified: Secondary | ICD-10-CM | POA: Diagnosis present

## 2023-10-22 DIAGNOSIS — W050XXA Fall from non-moving wheelchair, initial encounter: Secondary | ICD-10-CM | POA: Diagnosis present

## 2023-10-22 DIAGNOSIS — Z79890 Hormone replacement therapy (postmenopausal): Secondary | ICD-10-CM | POA: Diagnosis not present

## 2023-10-22 DIAGNOSIS — Z743 Need for continuous supervision: Secondary | ICD-10-CM | POA: Diagnosis not present

## 2023-10-22 DIAGNOSIS — R6889 Other general symptoms and signs: Secondary | ICD-10-CM | POA: Diagnosis not present

## 2023-10-22 DIAGNOSIS — S42212D Unspecified displaced fracture of surgical neck of left humerus, subsequent encounter for fracture with routine healing: Secondary | ICD-10-CM | POA: Diagnosis not present

## 2023-10-22 DIAGNOSIS — R2681 Unsteadiness on feet: Secondary | ICD-10-CM | POA: Diagnosis not present

## 2023-10-22 DIAGNOSIS — S42202A Unspecified fracture of upper end of left humerus, initial encounter for closed fracture: Secondary | ICD-10-CM | POA: Diagnosis not present

## 2023-10-22 DIAGNOSIS — E785 Hyperlipidemia, unspecified: Secondary | ICD-10-CM | POA: Diagnosis present

## 2023-10-22 DIAGNOSIS — Z7401 Bed confinement status: Secondary | ICD-10-CM | POA: Diagnosis not present

## 2023-10-22 DIAGNOSIS — Z043 Encounter for examination and observation following other accident: Secondary | ICD-10-CM | POA: Diagnosis not present

## 2023-10-22 DIAGNOSIS — Z741 Need for assistance with personal care: Secondary | ICD-10-CM | POA: Diagnosis not present

## 2023-10-22 DIAGNOSIS — Y92009 Unspecified place in unspecified non-institutional (private) residence as the place of occurrence of the external cause: Secondary | ICD-10-CM | POA: Diagnosis not present

## 2023-10-22 DIAGNOSIS — G8929 Other chronic pain: Secondary | ICD-10-CM

## 2023-10-22 DIAGNOSIS — M069 Rheumatoid arthritis, unspecified: Secondary | ICD-10-CM | POA: Diagnosis present

## 2023-10-22 DIAGNOSIS — R1319 Other dysphagia: Secondary | ICD-10-CM | POA: Diagnosis not present

## 2023-10-22 DIAGNOSIS — R488 Other symbolic dysfunctions: Secondary | ICD-10-CM | POA: Diagnosis not present

## 2023-10-22 DIAGNOSIS — I130 Hypertensive heart and chronic kidney disease with heart failure and stage 1 through stage 4 chronic kidney disease, or unspecified chronic kidney disease: Secondary | ICD-10-CM | POA: Diagnosis present

## 2023-10-22 DIAGNOSIS — F411 Generalized anxiety disorder: Secondary | ICD-10-CM | POA: Diagnosis present

## 2023-10-22 DIAGNOSIS — G2581 Restless legs syndrome: Secondary | ICD-10-CM | POA: Diagnosis present

## 2023-10-22 DIAGNOSIS — I714 Abdominal aortic aneurysm, without rupture, unspecified: Secondary | ICD-10-CM | POA: Diagnosis present

## 2023-10-22 DIAGNOSIS — D630 Anemia in neoplastic disease: Secondary | ICD-10-CM | POA: Diagnosis present

## 2023-10-22 DIAGNOSIS — T07XXXA Unspecified multiple injuries, initial encounter: Secondary | ICD-10-CM | POA: Diagnosis present

## 2023-10-22 DIAGNOSIS — K219 Gastro-esophageal reflux disease without esophagitis: Secondary | ICD-10-CM | POA: Diagnosis present

## 2023-10-22 DIAGNOSIS — R262 Difficulty in walking, not elsewhere classified: Secondary | ICD-10-CM | POA: Diagnosis not present

## 2023-10-22 DIAGNOSIS — S42212A Unspecified displaced fracture of surgical neck of left humerus, initial encounter for closed fracture: Secondary | ICD-10-CM | POA: Diagnosis present

## 2023-10-22 LAB — COMPREHENSIVE METABOLIC PANEL WITH GFR
ALT: 41 U/L (ref 0–44)
AST: 49 U/L — ABNORMAL HIGH (ref 15–41)
Albumin: 2.6 g/dL — ABNORMAL LOW (ref 3.5–5.0)
Alkaline Phosphatase: 126 U/L (ref 38–126)
Anion gap: 10 (ref 5–15)
BUN: 14 mg/dL (ref 8–23)
CO2: 26 mmol/L (ref 22–32)
Calcium: 8.2 mg/dL — ABNORMAL LOW (ref 8.9–10.3)
Chloride: 100 mmol/L (ref 98–111)
Creatinine, Ser: 0.94 mg/dL (ref 0.44–1.00)
GFR, Estimated: >60 mL/min (ref >60)
Glucose, Bld: 108 mg/dL — ABNORMAL HIGH (ref 70–99)
Potassium: 3.3 mmol/L — ABNORMAL LOW (ref 3.5–5.1)
Sodium: 136 mmol/L (ref 135–145)
Total Bilirubin: 0.7 mg/dL (ref 0.0–1.2)
Total Protein: 5.4 g/dL — ABNORMAL LOW (ref 6.5–8.1)

## 2023-10-22 LAB — SURGICAL PCR SCREEN
MRSA, PCR: NEGATIVE
Staphylococcus aureus: POSITIVE — AB

## 2023-10-22 LAB — PROTIME-INR
INR: 1.3 — ABNORMAL HIGH (ref 0.8–1.2)
Prothrombin Time: 16.3 s — ABNORMAL HIGH (ref 11.4–15.2)

## 2023-10-22 LAB — CBC
HCT: 26.2 % — ABNORMAL LOW (ref 36.0–46.0)
Hemoglobin: 8 g/dL — ABNORMAL LOW (ref 12.0–15.0)
MCH: 29.1 pg (ref 26.0–34.0)
MCHC: 30.5 g/dL (ref 30.0–36.0)
MCV: 95.3 fL (ref 80.0–100.0)
Platelets: 76 10*3/uL — ABNORMAL LOW (ref 150–400)
RBC: 2.75 MIL/uL — ABNORMAL LOW (ref 3.87–5.11)
RDW: 15.9 % — ABNORMAL HIGH (ref 11.5–15.5)
WBC: 6.1 10*3/uL (ref 4.0–10.5)
nRBC: 0 % (ref 0.0–0.2)

## 2023-10-22 LAB — GLUCOSE, CAPILLARY: Glucose-Capillary: 105 mg/dL — ABNORMAL HIGH (ref 70–99)

## 2023-10-22 LAB — APTT: aPTT: 35 s (ref 24–36)

## 2023-10-22 MED ORDER — ENOXAPARIN SODIUM 40 MG/0.4ML IJ SOSY
40.0000 mg | PREFILLED_SYRINGE | Freq: Every day | INTRAMUSCULAR | Status: DC
  Administered 2023-10-23: 40 mg via SUBCUTANEOUS
  Filled 2023-10-22 (×4): qty 0.4

## 2023-10-22 MED ORDER — MUPIROCIN 2 % EX OINT
1.0000 | TOPICAL_OINTMENT | Freq: Two times a day (BID) | CUTANEOUS | Status: DC
  Administered 2023-10-22 – 2023-10-25 (×7): 1 via NASAL
  Filled 2023-10-22 (×3): qty 22

## 2023-10-22 MED ORDER — ORAL CARE MOUTH RINSE: 15.0000 mL | OROMUCOSAL | Status: DC | PRN

## 2023-10-22 MED ORDER — OXYCODONE HCL 5 MG PO TABS
5.0000 mg | ORAL_TABLET | Freq: Four times a day (QID) | ORAL | Status: DC | PRN
  Administered 2023-10-22 – 2023-10-24 (×7): 5 mg via ORAL
  Filled 2023-10-22 (×8): qty 1

## 2023-10-22 MED ORDER — POTASSIUM CHLORIDE 20 MEQ PO PACK
40.0000 meq | PACK | Freq: Once | ORAL | Status: AC
  Administered 2023-10-22: 40 meq via ORAL
  Filled 2023-10-22: qty 2

## 2023-10-22 NOTE — Evaluation (Signed)
 Occupational Therapy Evaluation Patient Details Name: Peggy Armstrong MRN: [id number] DOB: 10-03-1945 Today's Date: 10/22/2023   History of Present Illness   78 y/o F presenting to ED on 4/26 after mechanical fall, fell to L side. 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     Clinical Impressions Pt reports living with her daughter, has some assist for ADLs, and mobilizes with w/c in her apartment but reports she does navigate steps to go in/out. Pt currently needing mod-max A for ADLs, mod +2 for bed mobility to sit EOB. Pt educated on NWB precautions and adjusted sling for proper positioning. Pt reported dizziness sitting EOB, BP taken once returned to supine 101/50 (65). Pt presenting with impairments listed below, will follow acutely. Patient will benefit from continued inpatient follow up therapy, <3 hours/day to maximize safety/ind with ADL/functional mobility.      If plan is discharge home, recommend the following:   Two people to help with walking and/or transfers;A lot of help with bathing/dressing/bathroom;Assistance with cooking/housework;Direct supervision/assist for medications management;Direct supervision/assist for financial management;Assist for transportation;Help with stairs or ramp for entrance     Functional Status Assessment   Patient has had a recent decline in their functional status and demonstrates the ability to make significant improvements in function in a reasonable and predictable amount of time.     Equipment Recommendations   Other (comment) (defer)     Recommendations for Other Services   PT  consult     Precautions/Restrictions   Precautions Precautions: Fall Recall of Precautions/Restrictions: Intact Required Braces or Orthoses: Sling;Spinal Brace Spinal Brace: Lumbar corset;Other (comment) Spinal Brace Comments: pt has been wearing since Nov 2024, hx L2 fx Restrictions Weight Bearing Restrictions Per Provider Order: Yes LUE Weight Bearing Per Provider Order: Non weight bearing LLE Weight Bearing Per Provider Order: Weight bearing as tolerated     Mobility Bed Mobility Overal bed mobility: Needs Assistance Bed Mobility: Rolling, Sidelying to Sit, Sit to Sidelying Rolling: Mod assist Sidelying to sit: Mod assist, +2 for physical assistance     Sit to sidelying: Mod assist, +2 for physical assistance      Transfers                   General transfer comment: deferred 2/2 pain/dizziness      Balance Overall balance assessment: Needs assistance Sitting-balance support: Feet supported Sitting balance-Leahy Scale: Fair Sitting balance - Comments: sits EOB CGA                                   ADL either performed or assessed with clinical judgement   ADL Overall ADL's : Needs assistance/impaired Eating/Feeding: Set up;Sitting   Grooming: Wash/dry face;Set up;Sitting   Upper Body Bathing: Moderate assistance;Sitting;Bed level   Lower Body Bathing: Maximal assistance;Sitting/lateral leans;Bed level   Upper Body Dressing : Moderate assistance;Sitting;Bed level   Lower Body Dressing: Maximal assistance;Sitting/lateral leans;Bed level   Toilet Transfer: Maximal assistance;+2 for physical assistance   Toileting- Clothing Manipulation and Hygiene: Maximal assistance       Functional mobility during ADLs: Maximal assistance;+2 for physical assistance  Vision   Vision Assessment?: No apparent visual deficits Additional Comments: noted to be closign L eye at times during session, did report dizziness     Perception  Perception: Not tested       Praxis Praxis: Not tested       Pertinent Vitals/Pain Pain Assessment Pain Assessment: Faces Pain Score: 5  Faces Pain Scale: Hurts even more Pain Location: L shoulder/elbow Pain Descriptors / Indicators: Discomfort Pain Intervention(s): Limited activity within patient's tolerance, Monitored during session, Repositioned     Extremity/Trunk Assessment Upper Extremity Assessment Upper Extremity Assessment: LUE deficits/detail LUE Deficits / Details: able to move digits and flex/ext wrist. elbow/shoulder not tested due to immobilization in sling LUE: Unable to fully assess due to immobilization LUE Coordination: decreased fine motor;decreased gross motor   Lower Extremity Assessment Lower Extremity Assessment: Defer to PT evaluation   Cervical / Trunk Assessment Cervical / Trunk Assessment: Kyphotic;Other exceptions   Communication Communication Communication: No apparent difficulties   Cognition Arousal: Alert Behavior During Therapy: WFL for tasks assessed/performed Cognition: No apparent impairments                               Following commands: Intact       Cueing  General Comments   Cueing Techniques: Verbal cues  BP 101/50 (65) upon return to supine in bed   Exercises     Shoulder Instructions      Home Living Family/patient expects to be discharged to:: Private residence Living Arrangements: Children (daughter) Available Help at Discharge: Family;Available PRN/intermittently Type of Home: Apartment Home Access: Stairs to enter Entrance Stairs-Number of Steps: flight Entrance Stairs-Rails: Right;Left Home Layout: One level     Bathroom Shower/Tub: Tub/shower unit         Home Equipment: Grab bars - tub/shower;Rolling Environmental consultant (2 wheels);Cane - single point;Tub bench;BSC/3in1          Prior Functioning/Environment Prior Level of Function : Needs assist             Mobility Comments: walking  ~54 steps with HHPT , navigated steps, w/c ADLs Comments: uses bathroom independently, ind with dressing    OT Problem List: Decreased strength;Decreased range of motion;Decreased activity tolerance;Impaired balance (sitting and/or standing);Decreased coordination;Decreased safety awareness;Impaired UE functional use;Pain   OT Treatment/Interventions: Self-care/ADL training;Therapeutic exercise;Energy conservation;DME and/or AE instruction;Therapeutic activities;Patient/family education;Balance training      OT Goals(Current goals can be found in the care plan section)   Acute Rehab OT Goals Patient Stated Goal: none stated OT Goal Formulation: With patient Time For Goal Achievement: 11/05/23 Potential to Achieve Goals: Good ADL Goals Pt Will Perform Grooming: with set-up;sitting Pt Will Perform Upper Body Dressing: with min assist;sitting Pt Will Perform Lower Body Dressing: with mod assist;sitting/lateral leans;sit to/from stand Pt Will Transfer to Toilet: with min assist;with +2 assist;squat pivot transfer;stand pivot transfer;bedside commode   OT Frequency:  Min 2X/week    Co-evaluation              AM-PAC OT "6 Clicks" Daily Activity     Outcome Measure Help from another person eating meals?: A Little Help from another person taking care of personal grooming?: A Little Help from another person toileting, which includes using toliet, bedpan, or urinal?: A Lot Help from another person bathing (including washing, rinsing, drying)?: A Lot Help from another person to put on and taking off regular upper body clothing?: A Lot Help from another person to put  on and taking off regular lower body clothing?: A Lot 6 Click Score: 14   End of Session Equipment Utilized During Treatment: Gait belt;Other (comment) (L UE sling) Nurse Communication: Mobility status;Patient requests pain meds  Activity Tolerance: Patient tolerated treatment well Patient left: in bed;with call  bell/phone within reach;with bed alarm set  OT Visit Diagnosis: Unsteadiness on feet (R26.81);Other abnormalities of gait and mobility (R26.89);Muscle weakness (generalized) (M62.81)                Time: 4098-1191 OT Time Calculation (min): 29 min Charges:  OT General Charges $OT Visit: 1 Visit OT Evaluation $OT Eval Moderate Complexity: 1 Mod  Arla Boutwell K, OTD, OTR/L SecureChat Preferred Acute Rehab [phone]   Grizelda Piscopo K Koonce 10/22/2023, 10:01 AM

## 2023-10-22 NOTE — ED Notes (Signed)
 Pt taken to CT.

## 2023-10-22 NOTE — ED Notes (Signed)
 Contacted CCMD for monitoring

## 2023-10-22 NOTE — Evaluation (Signed)
 Physical Therapy Evaluation Patient Details Name: Peggy Armstrong MRN: [id number] DOB: 13-Dec-1945 Today's Date: 10/22/2023  History of Present Illness  78 y/o F presenting to ED on 4/26 after mechanical fall, fell to L side. 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  Clinical Impression   Pt reports living with her daughter, has some assist for ADLs, and mobilizes with w/c in her apartment but reports she does navigate steps to go in/out. Pt currently needing mod-max A for ADLs, mod +2 for bed mobility to sit EOB. Pt educated on NWB precautions and adjusted sling for proper positioning. Pt reported dizziness sitting EOB, BP taken once returned to supine 101/50 (65). Likely was lower while pt sat EOB; consider getting a set of orthostatic BPs next session; Pt presenting with impairments listed below, will follow acutely. Patient will benefit from continued inpatient follow up therapy, <3 hours/day to maximize safety/ind with ADL/functional mobility.         If plan is discharge home, recommend the following: Two people to help with walking and/or transfers;Two people to help with bathing/dressing/bathroom   Can travel by private vehicle   No    Equipment Recommendations Wheelchair (measurements PT);Wheelchair cushion (measurements PT);Other (comment) (will continue to consider unilateral assistive devices for standing activities)  Recommendations for Other Services       Functional Status Assessment Patient has had a recent decline in their functional status and demonstrates the ability to make significant improvements in function in a reasonable  and predictable amount of time.     Precautions / Restrictions Precautions Precautions: Fall Recall of Precautions/Restrictions: Intact Required Braces or Orthoses: Sling;Spinal Brace Spinal Brace: Lumbar corset;Other (comment) Spinal Brace Comments: pt has been wearing since Nov 2024, hx L2 fx Restrictions Weight Bearing Restrictions Per Provider Order: Yes LUE Weight Bearing Per Provider Order: Non weight bearing LLE Weight Bearing Per Provider Order: Weight bearing as tolerated      Mobility  Bed Mobility Overal bed mobility: Needs Assistance Bed Mobility: Rolling, Sidelying to Sit, Sit to Sidelying Rolling: Mod assist Sidelying to sit: Mod assist, +2 for physical assistance     Sit to sidelying: Mod assist, +2 for physical assistance General bed mobility comments: heavy mod assist of 2 to elevated trunk to sitting position    Transfers                   General transfer comment: deferred 2/2 pain/dizziness    Ambulation/Gait                  Stairs            Wheelchair Mobility     Tilt Bed    Modified Rankin (Stroke Patients Only)       Balance Overall balance assessment: Needs assistance Sitting-balance support: Feet supported Sitting balance-Leahy Scale: Fair Sitting balance - Comments: sits EOB CGA; Sat EOB for 8-10 minutes; assisted pt with sling fit; reported dizziness with incr time in sitting                                     Pertinent Vitals/Pain  Pain Assessment Pain Assessment: Faces Pain Score: 5  Faces Pain Scale: Hurts even more Pain Location: L shoulder/elbow Pain Descriptors / Indicators: Discomfort Pain Intervention(s): Monitored during session, Limited activity within patient's tolerance, Repositioned    Home Living Family/patient expects to be discharged to:: Private residence Living Arrangements: Children (daughter) Available Help at Discharge: Family;Available PRN/intermittently Type of  Home: Apartment Home Access: Stairs to enter Entrance Stairs-Rails: Right;Left Entrance Stairs-Number of Steps: flight   Home Layout: One level Home Equipment: Grab bars - tub/shower;Rolling Walker (2 wheels);Cane - single point;Tub bench;BSC/3in1      Prior Function Prior Level of Function : Needs assist             Mobility Comments: walking ~54 steps with HHPT , navigated steps, w/c ADLs Comments: uses bathroom independently, ind with dressing     Extremity/Trunk Assessment   Upper Extremity Assessment Upper Extremity Assessment: Defer to OT evaluation LUE Deficits / Details: able to move digits and flex/ext wrist. elbow/shoulder not tested due to immobilization in sling LUE: Unable to fully assess due to immobilization LUE Coordination: decreased fine motor;decreased gross motor    Lower Extremity Assessment Lower Extremity Assessment: Generalized weakness;LLE deficits/detail LLE Deficits / Details: Pain L groin; Dr. Lilyan Remedies came in durin gsession and indicated pt is ableot WBAT LLE    Cervical / Trunk Assessment Cervical / Trunk Assessment: Kyphotic;Other exceptions  Communication   Communication Communication: No apparent difficulties    Cognition Arousal: Alert Behavior During Therapy: WFL for tasks assessed/performed                             Following commands: Intact       Cueing Cueing Techniques: Verbal cues     General Comments General comments (skin integrity, edema, etc.): BP 101/50 (65) upon return to supine in bed    Exercises     Assessment/Plan    PT Assessment Patient needs continued PT services  PT Problem List Decreased strength;Decreased range of motion;Decreased activity tolerance;Decreased balance;Decreased mobility;Decreased coordination;Decreased knowledge of use of DME;Decreased knowledge of precautions;Cardiopulmonary status limiting activity;Pain;Obesity (Syncopal symptoms in sitting)       PT Treatment  Interventions DME instruction;Gait training;Functional mobility training;Therapeutic activities;Therapeutic exercise;Balance training;Neuromuscular re-education;Cognitive remediation;Patient/family education;Wheelchair mobility training;Manual techniques    PT Goals (Current goals can be found in the Care Plan section)  Acute Rehab PT Goals Patient Stated Goal: less pain, and be able to get up PT Goal Formulation: With patient Time For Goal Achievement: 11/05/23 Potential to Achieve Goals: Good    Frequency Min 2X/week     Co-evaluation               AM-PAC PT "6 Clicks" Mobility  Outcome Measure Help needed turning from your back to your side while in a flat bed without using bedrails?: A Lot Help needed moving from lying on your back to sitting on the side of a flat bed without using bedrails?: A Lot Help needed moving to and from a bed to a chair (including a wheelchair)?: Total Help needed standing up from a chair using your arms (e.g., wheelchair or bedside chair)?: Total Help needed to walk in hospital room?: Total Help needed climbing 3-5 steps with a railing? : Total 6 Click Score: 8    End of Session Equipment Utilized During Treatment: Other (comment) (sling, bed pads) Activity Tolerance: Patient limited by pain;Other (comment) (and some syncopal symptoms in sitting) Patient left: in bed;with call  bell/phone within reach;with bed alarm set Nurse Communication: Mobility status PT Visit Diagnosis: Unsteadiness on feet (R26.81);Other abnormalities of gait and mobility (R26.89);History of falling (Z91.81);Muscle weakness (generalized) (M62.81);Pain Pain - Right/Left: Left Pain - part of body: Arm;Leg;Hip    Time: 1610-9604 PT Time Calculation (min) (ACUTE ONLY): 28 min   Charges:   PT Evaluation $PT Eval Moderate Complexity: 1 Mod   PT General Charges $$ ACUTE PT VISIT: 1 Visit         Darcus Eastern, PT  Acute Rehabilitation Services Office  [phone] Secure Chat welcomed   Marcial Setting 10/22/2023, 10:50 AM

## 2023-10-22 NOTE — Progress Notes (Signed)
 Orthopedic Tech Progress Note Patient Details:  Peggy Armstrong May 09, 1946 [id number]  Ortho Devices Type of Ortho Device: Sling immobilizer Ortho Device/Splint Location: lue Ortho Device/Splint Interventions: Ordered, Application, Adjustment   Post Interventions Patient Tolerated: Well Instructions Provided: Care of device, Adjustment of device  [name] 10/22/2023, 1:06 AM

## 2023-10-22 NOTE — ED Notes (Signed)
 Ortho tech at bedside for arm sling

## 2023-10-22 NOTE — Progress Notes (Signed)
 Pt not on tele at this time due to shortage in equipment. Secretary contacted multiple units to get access to equipment with little to no success. Missing leads at this time. Was able to get a box. Charge nurse aware.

## 2023-10-22 NOTE — Progress Notes (Signed)
 Dtr requesting pt be closer to home ([place], [place]) for rehab facility

## 2023-10-22 NOTE — Progress Notes (Signed)
 Consult for lovenox  for DVT prophylaxis while apixaban  on hold.  Lovenox  40mg  SQ qday  Ivery Marking, PharmD, BCIDP, AAHIVP, CPP Infectious Disease Pharmacist 10/22/2023 9:31 AM

## 2023-10-22 NOTE — Progress Notes (Signed)
 PROGRESS NOTE    [name]  ZOX:[id number] DOB: 1946/04/04 DOA: 10/21/2023 PCP: Peggy Corrente, PA-C   Brief Narrative:  HPI:  Peggy [facility] is a 78 y.o. female with medical history significant HFpEF, paroxysmal atrial fibrillation on Eliquis , AAA, essential hypertension, history of recurrent GI bleed, rheumatoid arthritis, CKD stage IIIa, depression, anxiety, hypothyroidism, DVT, hyperlipidemia.  Patient has been recently hospitalized to [facility] discharged on 10/19/2023.  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   Per chart review of [facility] note recommended to hold  Eliquis  10 days postprocedure starting from 10/19/2023.   Patient presented to emergency department today for evaluation for fall.  She reported that when she stood up from the wheelchair lost balance and fell on the left side.  Reported hitting of the head.  Since the fall endorsing left-sided shoulder and knee pain.  Reported she was on the ground for almost 1 hour before family assisted her to bed. Patient denies any chest pain, palpitation, headache, blurry vision, syncope presyncope.  No other complaint at this time.   At presentation to ED patient is hemodynamically stable. CBC showing stable H&H 8.8 and 29.  Platelet count 83 which is around baseline.  Normal WBC count. BMP showing creatinine 1.1 GFR 51.  Renal function at baseline.   Extensive imaging of the ED following: X-ray of the left shoulder showed: Acute displaced and comminuted impacted left humeral head and neck fracture.   X-ray of the left elbow: Slightly limited evaluation due to nonstandard lateral view. Vague cortical irregularity along the olecranon coronoid process. Finding could represent an  avulsion fracture.   X-ray of the hip: Cortical irregularity along the left greater trochanter with acute nondisplaced fracture not excluded. Correlate with point tenderness to palpation.   X-ray of the left knee no fracture or dislocation.   CT head and CT cervical spine: No evidence of acute intracranial abnormality.  No acute displaced cervical fracture.Partial empty sella. Findings is often a normal anatomic variant but can be associated with idiopathic intracranial hypertension (pseudotumor cerebri).   CT scan of the elbow no evidence of fracture CT scan of the pelvis no evidence of fracture or dislocation.   ED physician consulted orthopedist [facility] recommended sling placement, not weightbearing of the left upper extremity.  F/u with [facility] in 5-7 days post dc. Call [phone] to schedule.    Hospitalist has been consulted for further evaluation management of mechanical fall and left proximal humeral fracture.  Assessment & Plan:   Principal Problem:   Fall at home, initial encounter Active Problems:   Left humeral fracture   Left elbow avulsion fracture   Duodenal adenocarcinoma (HCC)   CKD (chronic kidney disease) stage 3, GFR 30-59 ml/min (HCC)   Essential hypertension   Chronic diastolic CHF (congestive heart failure) (HCC)   History of DVT (deep vein thrombosis)   Paroxysmal atrial fibrillation (HCC)   AAA (abdominal aortic aneurysm) (HCC)   Hyperlipidemia   GAD (generalized anxiety disorder)   Hypothyroidism   History of GI bleed   History of depression  Mechanical fall at home home leading to left humeral head fracture, POA: X-ray of the shoulder showed acute displaced communicated left femoral head and neck fracture. X-ray of the left elbow showed avulsion fracture however CT left elbow ruled  out avulsion fracture. - X-ray of the left hip showed left greater trochanter questionable fracture but CT of the pelvis ruled out greater trochanter  fracture. - CT head and CT cervical spine unremarkable -ED physician consulted orthopedist [facility] recommended sling placement, not weightbearing of the left upper extremity.  Peggy [facility] recommended f/u with [facility] in 5-7 days post dc. Call [phone] to schedule.  -Left-sided shoulder sling has been placed.  Continue nonweightbearing of the left upper extremity.  Patient still with significant pain and does not feel comfortable going home.  Patient seen by PT OT and they recommend SNF.  TOC consulted.   History of GI bleed and anemia of chronic disease/ New diagnosis of gastric adenocarcinoma -Patient has history of hemorrhagic shock and recurrent GI bleed.  She underwent CT abdomen pelvis 4/25 which showed duodenal lesion. Patient underwent biopsy by surgical oncology which showed poorly differentiated mucinous adenocarcinoma. -Surgical oncology has consulted while at [facility].  Pending further recommendation - Patient has been managed with IV Protonix  twice daily recommended to continue PPI twice daily on discharge.  Hemoglobin fairly stable at 8.0.  Continue iron supplement.   Essential hypertension Chronic diastolic heart failure preserved EF 60 to 65% - Per chart review of discharge summary from [facility] patient supposed to be on Cardizem  240 mg daily daily along with chlorthalidone , losartan , both Lasix  and torsemide ?.  Currently blood pressure is borderline soft and heart rate around 80. - Starting Cardizem  on 120 mg daily and chlorthalidone  25 mg daily.  Continuing torsemide .  If blood pressure continue to trend can resume losartan .  If develops tachycardia can increase the dose of Cardizem  to 240 mg.  However currently both blood pressure and heart rate controlled so we will continue current management.   Paroxysmal atrial fibrillation - Continue Cardizem  120 mg daily and amiodarone  200 mg daily.  Based on the heart rate trend can increase the dose of Cardizem  to 240 mg. -History of  recent GI bleed and hemorrhagic shock.  Per chart review of the [facility] oncology surgeon note recommended to hold Eliquis  10 days starting from 10/19/2023.   History of DVT - Eliquis  on hold for 10 days starting from 4/24   Hyperlipidemia -Continue Lipitor   Hypothyroidism -Continue levothyroxine    Generalized anxiety disorder History of depression -Continue Cymbalta , Zoloft  and BuSpar    CKD stage III 3a -Creatinine 1.1 and GFR 51.  Renal function at baseline.   Gout -Continue allopurinol .   Reactive airway disease -Continue albuterol  as needed   DVT prophylaxis: SCDs Start: 10/21/23 2355 Place TED hose Start: 10/21/23 2355   Code Status: Full Code  Family Communication:  None present at bedside.  Plan of care discussed with patient in length and he/she verbalized understanding and agreed with it.  Status is: Observation The patient will require care spanning > 2 midnights and should be moved to inpatient because: Patient in severe pain, needs SNF discharge   Estimated body mass index is 37.37 kg/m as calculated from the following:   Height as of this encounter: 5' (1.524 m).   Weight as of this encounter: 86.8 kg.    Nutritional Assessment: Body mass index is 37.37 kg/m.Aaron Aas Seen by dietician.  I agree with the assessment and plan as outlined below: Nutrition Status:        . Skin Assessment: I have examined the patient's skin and I agree with the wound assessment as performed by the wound care RN as outlined below:    Consultants:  Orthopedics  Procedures:  None  Antimicrobials:  Anti-infectives (From admission, onward)    None         Subjective: Patient seen and examined, she was working with PT/OT.  She was sitting at the edge of the bed.  She was complaining of severe pain in the left forearm and hot flashes.    Objective: Vitals:   10/22/23 0120 10/22/23 0131 10/22/23 0455 10/22/23 0725  BP: 134/72  (!) 142/79 129/60  Pulse: 80  88 86  Resp:  18  16 16   Temp: 99.2 F (37.3 C)  98.4 F (36.9 C) 98.7 F (37.1 C)  TempSrc:    Oral  SpO2: 90%  90% (!) 89%  Weight:  86.8 kg    Height:  5' (1.524 m)      Intake/Output Summary (Last 24 hours) at 10/22/2023 0924 Last data filed at 10/22/2023 0600 Gross per 24 hour  Intake 225 ml  Output 250 ml  Net -25 ml   Filed Weights   10/22/23 0131  Weight: 86.8 kg    Examination:  General exam: Appears calm and comfortable  Respiratory system: Clear to auscultation. Respiratory effort normal. Cardiovascular system: S1 & S2 heard, RRR. No JVD, murmurs, rubs, gallops or clicks. No pedal edema. Gastrointestinal system: Abdomen is nondistended, soft and nontender. No organomegaly or masses felt. Normal bowel sounds heard. Central nervous system: Alert and oriented. No focal neurological deficits. Extremities: Sling in the left upper extremity. Skin: No rashes, lesions or ulcers Psychiatry: Judgement and insight appear normal. Mood & affect appropriate.    Data Reviewed: I have personally reviewed following labs and imaging studies  CBC: Recent Labs  Lab 10/21/23 2029 10/22/23 0510  WBC 6.5 6.1  NEUTROABS 5.3  --   HGB 8.8* 8.0*  HCT 29.4* 26.2*  MCV 98.3 95.3  PLT 83* 76*   Basic Metabolic Panel: Recent Labs  Lab 10/21/23 2029 10/22/23 0510  NA 140 136  K 3.6 3.3*  CL 102 100  CO2 34* 26  GLUCOSE 108* 108*  BUN 17 14  CREATININE 1.11* 0.94  CALCIUM  8.6* 8.2*   GFR: Estimated Creatinine Clearance: 48.3 mL/min (by C-G formula based on SCr of 0.94 mg/dL). Liver Function Tests: Recent Labs  Lab 10/22/23 0510  AST 49*  ALT 41  ALKPHOS 126  BILITOT 0.7  PROT 5.4*  ALBUMIN 2.6*   No results for input(s): "LIPASE", "AMYLASE" in the last 168 hours. No results for input(s): "AMMONIA" in the last 168 hours. Coagulation Profile: Recent Labs  Lab 10/22/23 0510  INR 1.3*   Cardiac Enzymes: Recent Labs  Lab 10/21/23 2029  CKTOTAL 32*   BNP (last 3  results) Recent Labs    08/24/23 1529  PROBNP 1,402*   HbA1C: No results for input(s): "HGBA1C" in the last 72 hours. CBG: No results for input(s): "GLUCAP" in the last 168 hours. Lipid Profile: No results for input(s): "CHOL", "HDL", "LDLCALC", "TRIG", "CHOLHDL", "LDLDIRECT" in the last 72 hours. Thyroid  Function Tests: No results for input(s): "TSH", "T4TOTAL", "FREET4", "T3FREE", "THYROIDAB" in the last 72 hours. Anemia Panel: No results for input(s): "VITAMINB12", "FOLATE", "FERRITIN", "TIBC", "IRON", "RETICCTPCT" in the last 72 hours. Sepsis Labs: No results for input(s): "PROCALCITON", "LATICACIDVEN" in the last 168 hours.  Recent Results (from the past 240 hours)  Surgical PCR screen     Status: Abnormal   Collection Time: 10/22/23  1:46 AM   Specimen: Nasal Mucosa; Nasal Swab  Result Value Ref Range Status  MRSA, PCR NEGATIVE NEGATIVE Final   Staphylococcus aureus POSITIVE (A) NEGATIVE Final    Comment: (NOTE) The Xpert SA Assay (FDA approved for NASAL specimens in patients 20 years of age and older), is one component of a comprehensive surveillance program. It is not intended to diagnose infection nor to guide or monitor treatment. Performed at [facility], [address] [place], [place] [address]      Radiology Studies: CT Elbow Left Wo Contrast Result Date: 10/22/2023 CLINICAL DATA:  Fall, possible olecranon fracture EXAM: CT OF THE UPPER LEFT EXTREMITY WITHOUT CONTRAST TECHNIQUE: Multidetector CT imaging of the upper left extremity was performed according to the standard protocol. RADIATION DOSE REDUCTION: This exam was performed according to the departmental dose-optimization program which includes automated exposure control, adjustment of the mA and/or kV according to patient size and/or use of iterative reconstruction technique. COMPARISON:  Left elbow radiographs dated 10/21/2023 FINDINGS: No fracture or dislocation is seen. Specifically, the olecranon  is intact. Visualized soft tissues are unremarkable.  No elbow joint effusion. Mild subcutaneous stranding/hemorrhage in the posterior upper arm (series 4/image 1), incompletely visualized, likely related to the patient's known shoulder fracture. IMPRESSION: No fracture or dislocation is seen. Specifically, the olecranon is intact. Electronically Signed   By: Zadie Herter M.D.   On: 10/22/2023 01:00   CT PELVIS WO CONTRAST Result Date: 10/22/2023 CLINICAL DATA:  Fall EXAM: CT PELVIS WITHOUT CONTRAST TECHNIQUE: Multidetector CT imaging of the pelvis was performed following the standard protocol without intravenous contrast. RADIATION DOSE REDUCTION: This exam was performed according to the departmental dose-optimization program which includes automated exposure control, adjustment of the mA and/or kV according to patient size and/or use of iterative reconstruction technique. COMPARISON:  Left hip radiograph dated 10/21/2023 FINDINGS: Urinary Tract: Excretory contrast in the bladder, within normal limits. Bowel:  Grossly unremarkable. Vascular/Lymphatic: Atherosclerotic calcifications of the abdominal aorta and branch vessels. No suspicious pelvic lymphadenopathy. Reproductive:  Status post hysterectomy. Right ovary is within normal limits.  No left adnexal mass. Other:  No pelvic ascites. Musculoskeletal: No fracture or dislocation is seen. Specifically, the greater trochanter of the left hip appears intact. Visualized bony pelvis is intact. IMPRESSION: No fracture or dislocation is seen. Specifically, the greater trochanter of the left hip appears intact. Electronically Signed   By: Zadie Herter M.D.   On: 10/22/2023 00:57   CT Head Wo Contrast Result Date: 10/21/2023 CLINICAL DATA:  glf EXAM: CT HEAD WITHOUT CONTRAST CT CERVICAL SPINE WITHOUT CONTRAST TECHNIQUE: Multidetector CT imaging of the head and cervical spine was performed following the standard protocol without intravenous contrast.  Multiplanar CT image reconstructions of the cervical spine were also generated. RADIATION DOSE REDUCTION: This exam was performed according to the departmental dose-optimization program which includes automated exposure control, adjustment of the mA and/or kV according to patient size and/or use of iterative reconstruction technique. COMPARISON:  CT head and max face 05/13/2023 FINDINGS: CT HEAD FINDINGS Brain: Patchy and confluent areas of decreased attenuation are noted throughout the deep and periventricular white matter of the cerebral hemispheres bilaterally, compatible with chronic microvascular ischemic disease. No evidence of large-territorial acute infarction. No parenchymal hemorrhage. No mass lesion. No extra-axial collection. No mass effect or midline shift. No hydrocephalus. Basilar cisterns are patent. Partially empty sella. Vascular: No hyperdense vessel. Atherosclerotic calcifications are present within the cavernous internal carotid and vertebral arteries. Skull: No acute fracture or focal lesion. Sinuses/Orbits: Bilateral ethmoid mucosal thickening. Otherwise paranasal sinuses and mastoid air cells are clear.  The orbits are unremarkable. Other: None. CT CERVICAL SPINE FINDINGS Alignment: Normal. Skull base and vertebrae: No acute fracture. No aggressive appearing focal osseous lesion or focal pathologic process. Soft tissues and spinal canal: No prevertebral fluid or swelling. No visible canal hematoma. Upper chest: Unremarkable. Other: Atherosclerotic plaque of the aortic arch and its branches. Slightly heterogeneous nonenlarged thyroid  glands with associated calcifications-no further follow-up indicated. IMPRESSION: 1. No acute intracranial abnormality. 2. No acute displaced fracture or traumatic listhesis of the cervical spine. 3. Partial empty sella. Findings is often a normal anatomic variant but can be associated with idiopathic intracranial hypertension (pseudotumor cerebri). 4.  Aortic  Atherosclerosis (ICD10-I70.0). Electronically Signed   By: Morgane  Naveau M.D.   On: 10/21/2023 22:12   CT Cervical Spine Wo Contrast Result Date: 10/21/2023 CLINICAL DATA:  glf EXAM: CT HEAD WITHOUT CONTRAST CT CERVICAL SPINE WITHOUT CONTRAST TECHNIQUE: Multidetector CT imaging of the head and cervical spine was performed following the standard protocol without intravenous contrast. Multiplanar CT image reconstructions of the cervical spine were also generated. RADIATION DOSE REDUCTION: This exam was performed according to the departmental dose-optimization program which includes automated exposure control, adjustment of the mA and/or kV according to patient size and/or use of iterative reconstruction technique. COMPARISON:  CT head and max face 05/13/2023 FINDINGS: CT HEAD FINDINGS Brain: Patchy and confluent areas of decreased attenuation are noted throughout the deep and periventricular white matter of the cerebral hemispheres bilaterally, compatible with chronic microvascular ischemic disease. No evidence of large-territorial acute infarction. No parenchymal hemorrhage. No mass lesion. No extra-axial collection. No mass effect or midline shift. No hydrocephalus. Basilar cisterns are patent. Partially empty sella. Vascular: No hyperdense vessel. Atherosclerotic calcifications are present within the cavernous internal carotid and vertebral arteries. Skull: No acute fracture or focal lesion. Sinuses/Orbits: Bilateral ethmoid mucosal thickening. Otherwise paranasal sinuses and mastoid air cells are clear. The orbits are unremarkable. Other: None. CT CERVICAL SPINE FINDINGS Alignment: Normal. Skull base and vertebrae: No acute fracture. No aggressive appearing focal osseous lesion or focal pathologic process. Soft tissues and spinal canal: No prevertebral fluid or swelling. No visible canal hematoma. Upper chest: Unremarkable. Other: Atherosclerotic plaque of the aortic arch and its branches. Slightly  heterogeneous nonenlarged thyroid  glands with associated calcifications-no further follow-up indicated. IMPRESSION: 1. No acute intracranial abnormality. 2. No acute displaced fracture or traumatic listhesis of the cervical spine. 3. Partial empty sella. Findings is often a normal anatomic variant but can be associated with idiopathic intracranial hypertension (pseudotumor cerebri). 4.  Aortic Atherosclerosis (ICD10-I70.0). Electronically Signed   By: Morgane  Naveau M.D.   On: 10/21/2023 22:12   DG Knee 2 Views Left Result Date: 10/21/2023 CLINICAL DATA:  GLF onto shoulder EXAM: LEFT KNEE - 1-2 VIEW COMPARISON:  X-ray left knee 05/11/2023 FINDINGS: No evidence of fracture, dislocation, or joint effusion. No evidence of arthropathy or other focal bone abnormality. Soft tissues are unremarkable. Vascular calcification. IMPRESSION: No acute displaced fracture or dislocation. Electronically Signed   By: Morgane  Naveau M.D.   On: 10/21/2023 21:54   DG Hip Unilat W or Wo Pelvis 2-3 Views Left Result Date: 10/21/2023 CLINICAL DATA:  GLF onto shoulder EXAM: DG HIP (WITH OR WITHOUT PELVIS) 2-3V LEFT COMPARISON:  CT abdomen pelvis 07/20/2023 FINDINGS: Limited evaluation due to overlapping osseous structures and overlying soft tissues. Cortical irregularity along the left greater trochanter with acute nondisplaced fracture not excluded. No left hip dislocation. No acute displaced fracture or dislocation of the right hip on frontal view. No acute  displaced fracture or diastasis of the bones of the pelvis. There is no evidence of hip fracture or dislocation. There is no evidence of arthropathy or other focal bone abnormality. Left hip subcutaneus soft tissue edema. Vascular calcification. IMPRESSION: Cortical irregularity along the left greater trochanter with acute nondisplaced fracture not excluded. Correlate with point tenderness to palpation. Electronically Signed   By: Morgane  Naveau M.D.   On: 10/21/2023 21:53    DG Elbow 2 Views Left Result Date: 10/21/2023 CLINICAL DATA:  GLF onto shoulder EXAM: LEFT ELBOW - 2 VIEW COMPARISON:  None Available. FINDINGS: Slightly limited evaluation due to nonstandard lateral view. There is no evidence of definite acute displaced fracture or dislocation. Vague cortical irregularity along the olecranon coronoid process. Limited evaluation for joint effusion there is no evidence of arthropathy or other focal bone abnormality. Soft tissues are unremarkable. IMPRESSION: Slightly limited evaluation due to nonstandard lateral view. Vague cortical irregularity along the olecranon coronoid process. Finding could represent an avulsion fracture. Electronically Signed   By: Morgane  Naveau M.D.   On: 10/21/2023 21:50   DG Humerus Left Result Date: 10/21/2023 CLINICAL DATA:  GLF onto shoulder EXAM: LEFT HUMERUS - 2+ VIEW COMPARISON:  X-ray left shoulder 10/21/2023. FINDINGS: Redemonstration of proximal left humeral fracture better evaluated on x-ray left shoulder 10/21/2023. There is no evidence of other humeral fracture or other focal bone lesions. Elbow is grossly unremarkable. Soft tissues are unremarkable. IMPRESSION: 1. Redemonstration of proximal left humeral fracture better evaluated on x-ray left shoulder 10/21/2023. 2. No other acute humeral fracture. Electronically Signed   By: Morgane  Naveau M.D.   On: 10/21/2023 21:48   DG Shoulder Left Result Date: 10/21/2023 CLINICAL DATA:  GLF onto shoulder EXAM: LEFT SHOULDER - 2+ VIEW COMPARISON:  CT chest 03/29/2017 FINDINGS: No dislocation. Acute displaced and comminuted impacted left humeral head and neck fracture. Mild acromioclavicular joint and at least mild glenohumeral joint degenerative changes. No aggressive appearing osseous lesion. Subcutaneus soft tissue edema IMPRESSION: Acute displaced and comminuted impacted left humeral head and neck fracture. Electronically Signed   By: Morgane  Naveau M.D.   On: 10/21/2023 21:48     Scheduled Meds:  allopurinol   300 mg Oral Daily   amiodarone   200 mg Oral Daily   atorvastatin   20 mg Oral Daily   busPIRone   10 mg Oral BID   chlorthalidone   25 mg Oral Daily   cyanocobalamin   1,000 mcg Oral Daily   diltiazem   120 mg Oral Daily   DULoxetine   20 mg Oral Daily   ferrous sulfate   325 mg Oral Q breakfast   folic acid   1 mg Oral Daily   levothyroxine   100 mcg Oral QAC breakfast   mupirocin ointment  1 Application Nasal BID   pantoprazole   40 mg Oral BID   sertraline   25 mg Oral QHS   sodium chloride  flush  3 mL Intravenous Q12H   torsemide   20 mg Oral Daily   Continuous Infusions:  sodium chloride        LOS: 1 day   Modena Andes, MD [facility]  10/22/2023, 9:24 AM   *Please note that this is a verbal dictation therefore any spelling or grammatical errors are due to the "[employer] One" system interpretation.  Please page via Amion and do not message via secure chat for urgent patient care matters. Secure chat can be used for non urgent patient care matters.  How to contact the [facility] Attending or Consulting provider 7A - 7P or covering  provider during after hours 7P -7A, for this patient?  Check the care team in [facility] and look for a) attending/consulting [facility] provider listed and b) the [facility] team listed. Page or secure chat 7A-7P. Log into [email or web address] and use [place] universal password to access. If you do not have the password, please contact the [facility] operator. Locate the [facility] provider you are looking for under [facility] and page to a number that you can be directly reached. If you still have difficulty reaching the provider, please page the [facility] (Director on Call) for the Hospitalists listed on amion for assistance.

## 2023-10-23 DIAGNOSIS — Y92009 Unspecified place in unspecified non-institutional (private) residence as the place of occurrence of the external cause: Secondary | ICD-10-CM | POA: Diagnosis not present

## 2023-10-23 DIAGNOSIS — W19XXXA Unspecified fall, initial encounter: Secondary | ICD-10-CM | POA: Diagnosis not present

## 2023-10-23 DIAGNOSIS — I10 Essential (primary) hypertension: Secondary | ICD-10-CM | POA: Diagnosis not present

## 2023-10-23 DIAGNOSIS — N1831 Chronic kidney disease, stage 3a: Secondary | ICD-10-CM | POA: Diagnosis not present

## 2023-10-23 LAB — CBC WITH DIFFERENTIAL/PLATELET
Abs Immature Granulocytes: 0.07 10*3/uL (ref 0.00–0.07)
Basophils Absolute: 0 10*3/uL (ref 0.0–0.1)
Basophils Relative: 0 %
Eosinophils Absolute: 0 10*3/uL (ref 0.0–0.5)
Eosinophils Relative: 0 %
HCT: 26.1 % — ABNORMAL LOW (ref 36.0–46.0)
Hemoglobin: 8.1 g/dL — ABNORMAL LOW (ref 12.0–15.0)
Immature Granulocytes: 1 %
Lymphocytes Relative: 6 %
Lymphs Abs: 0.6 10*3/uL — ABNORMAL LOW (ref 0.7–4.0)
MCH: 29.1 pg (ref 26.0–34.0)
MCHC: 31 g/dL (ref 30.0–36.0)
MCV: 93.9 fL (ref 80.0–100.0)
Monocytes Absolute: 1.2 10*3/uL — ABNORMAL HIGH (ref 0.1–1.0)
Monocytes Relative: 12 %
Neutro Abs: 8.3 10*3/uL — ABNORMAL HIGH (ref 1.7–7.7)
Neutrophils Relative %: 81 %
Platelets: 77 10*3/uL — ABNORMAL LOW (ref 150–400)
RBC: 2.78 MIL/uL — ABNORMAL LOW (ref 3.87–5.11)
RDW: 15.7 % — ABNORMAL HIGH (ref 11.5–15.5)
WBC: 10.2 10*3/uL (ref 4.0–10.5)
nRBC: 0 % (ref 0.0–0.2)

## 2023-10-23 LAB — BASIC METABOLIC PANEL WITH GFR
Anion gap: 11 (ref 5–15)
BUN: 15 mg/dL (ref 8–23)
CO2: 25 mmol/L (ref 22–32)
Calcium: 8.1 mg/dL — ABNORMAL LOW (ref 8.9–10.3)
Chloride: 97 mmol/L — ABNORMAL LOW (ref 98–111)
Creatinine, Ser: 1.07 mg/dL — ABNORMAL HIGH (ref 0.44–1.00)
GFR, Estimated: 53 mL/min — ABNORMAL LOW (ref >60)
Glucose, Bld: 97 mg/dL (ref 70–99)
Potassium: 3.3 mmol/L — ABNORMAL LOW (ref 3.5–5.1)
Sodium: 133 mmol/L — ABNORMAL LOW (ref 135–145)

## 2023-10-23 MED ORDER — POTASSIUM CHLORIDE CRYS ER 20 MEQ PO TBCR
60.0000 meq | EXTENDED_RELEASE_TABLET | Freq: Once | ORAL | Status: AC
  Administered 2023-10-23: 60 meq via ORAL
  Filled 2023-10-23: qty 3

## 2023-10-23 MED ORDER — POTASSIUM CHLORIDE CRYS ER 20 MEQ PO TBCR
40.0000 meq | EXTENDED_RELEASE_TABLET | ORAL | Status: AC
  Administered 2023-10-23 (×2): 40 meq via ORAL
  Filled 2023-10-23 (×2): qty 2

## 2023-10-23 NOTE — Plan of Care (Signed)

## 2023-10-23 NOTE — TOC CAGE-AID Note (Signed)
 Transition of Care [facility]) - CAGE-AID Screening   Patient Details  Name: [facility] MRN: [id number] Date of Birth: [birth year]  Transition of [facility] CM/SW Contact:    Whitley Strycharz E Ranier Coach, LCSW Phone Number: 10/23/2023, 11:56 AM   Clinical Narrative: Patient denied substance use.   CAGE-AID Screening:    Have You Ever Felt You Ought to Cut Down on Your Drinking or Drug Use?: No Have People Annoyed You By Critizing Your Drinking Or Drug Use?: No Have You Felt Bad Or Guilty About Your Drinking Or Drug Use?: No Have You Ever Had a Drink or Used Drugs First Thing In The Morning to Steady Your Nerves or to Get Rid of a Hangover?: No CAGE-AID Score: 0  Substance Abuse Education Offered: No

## 2023-10-23 NOTE — Care Management Important Message (Signed)
 Important Message  Patient Details  Name: Peggy Armstrong MRN: [id number] Date of Birth: 12-18-1945   Important Message Given:  Yes - Medicare IM     Felix Host 10/23/2023, 2:35 PM

## 2023-10-23 NOTE — Progress Notes (Addendum)
 PROGRESS NOTE    Peggy Armstrong  JYN:[id number] DOB: 1945-07-10 DOA: 10/21/2023 PCP: Cherylene Corrente, PA-C   Brief Narrative:  Peggy Armstrong is a 78 y.o. female with medical history significant HFpEF, paroxysmal atrial fibrillation on Eliquis , AAA, essential hypertension, history of recurrent GI bleed, rheumatoid arthritis, CKD stage IIIa, depression, anxiety, hypothyroidism, DVT, hyperlipidemia.  Patient has been recently hospitalized to [facility] discharged on 10/19/2023.  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   Per chart review of [facility] general surgery note recommended to hold  Eliquis  10 days postprocedure starting from 10/19/2023.   Patient presented to emergency department 10/21/2023 for evaluation for fall.  She reported that when she stood up from the wheelchair lost balance and fell on the left side.  Reported hitting of the head.  Since the fall endorsing left-sided shoulder and knee pain.  Reported she was on the ground for almost 1 hour before family assisted her to bed.  No other symptoms.  Upon arrival, she was hemodynamically stable.  X-ray of the left shoulder revealed acute displaced and comminuted impacted left humeral head and neck fracture.  X-ray of the elbow showed possible avulsion fracture.  Rest of the imaging studies including CT scan of the head and cervical spine were unremarkable.ED physician consulted orthopedist Dr. Charol Copas. Patient was eventually admitted under hospitalist service.  Assessment & Plan:   Principal Problem:   Fall at home, initial encounter Active Problems:   Left humeral fracture   Left elbow avulsion fracture   Duodenal adenocarcinoma (HCC)   CKD (chronic kidney disease) stage 3, GFR 30-59 ml/min (HCC)    Essential hypertension   Chronic diastolic CHF (congestive heart failure) (HCC)   History of DVT (deep vein thrombosis)   Paroxysmal atrial fibrillation (HCC)   AAA (abdominal aortic aneurysm) (HCC)   Hyperlipidemia   GAD (generalized anxiety disorder)   Hypothyroidism   History of GI bleed   History of depression  Mechanical fall at home home leading to left humeral head fracture, POA: X-ray of the shoulder showed acute displaced communicated left femoral head and neck fracture. X-ray of the left elbow showed avulsion fracture however CT left elbow ruled out avulsion fracture. - X-ray of the left hip showed left greater trochanter questionable fracture but CT of the pelvis ruled out greater trochanter fracture. - CT head and CT cervical spine unremarkable -ED physician consulted orthopedist Dr. Charol Copas recommended sling placement, not weightbearing of the left upper extremity.  Dr. Charol Copas recommended f/u with Dr. Hiram Lukes in 5-7 days post dc. Call [phone] to schedule.  -Left-sided shoulder sling has been placed.  Continue nonweightbearing of the left upper extremity.  Patient still with significant pain and does not feel comfortable going home.  Patient seen by PT OT and they recommend SNF.  TOC consulted.   History of GI bleed and anemia of chronic disease/ New diagnosis of gastric adenocarcinoma -Patient has history of hemorrhagic shock and recurrent GI bleed.  She underwent CT abdomen pelvis 4/25 which showed duodenal lesion. Patient underwent biopsy by surgical oncology which showed poorly differentiated mucinous adenocarcinoma. -Surgical oncology has consulted while at [facility].  Pending further recommendation - Patient has been managed with IV Protonix  twice daily recommended to continue PPI twice daily on discharge.  Hemoglobin fairly stable at 8.0.  Continue iron supplement.  Essential hypertension Chronic diastolic heart failure preserved EF 60 to 65% - Per chart review of  discharge summary from [facility] patient supposed to be on Cardizem  240 mg daily daily along with chlorthalidone , losartan , both Lasix  and torsemide ?.  Currently blood pressure is borderline soft and heart rate around 80. - Starting Cardizem  on 120 mg daily and chlorthalidone  25 mg daily.  Continuing torsemide .  If blood pressure continue to trend can resume losartan .  If develops tachycardia can increase the dose of Cardizem  to 240 mg.  However currently both blood pressure and heart rate controlled so we will continue current management.   Paroxysmal atrial fibrillation - Continue Cardizem  120 mg daily and amiodarone  200 mg daily.  Based on the heart rate trend can increase the dose of Cardizem  to 240 mg. -History of recent GI bleed and hemorrhagic shock.  Per chart review of the [facility] oncology surgeon note recommended to hold Eliquis  10 days starting from 10/19/2023.   History of DVT - Eliquis  on hold for 10 days starting from 4/24   Hyperlipidemia -Continue Lipitor   Hypothyroidism -Continue levothyroxine    Generalized anxiety disorder History of depression -Continue Cymbalta , Zoloft  and BuSpar    CKD stage III 3a -Creatinine 1.1 and GFR 51.  Renal function at baseline.   Gout -Continue allopurinol .   Reactive airway disease -Continue albuterol  as needed  Hypokalemia: Replenished.  DVT prophylaxis: I placed this patient on DVT prophylaxis dose of Lovenox  but family and patient refused that and they only wanted SCD for now.   Code Status: Full Code  Family Communication:  None present at bedside.  Plan of care discussed with patient in length and he/she verbalized understanding and agreed with it.  I also spoke to her daughter over the phone and discussed the plan of care.  Status is: Inpatient Remains inpatient appropriate because: Patient is medically stable since 10/22/2023, pending placement to SNF.     Estimated body mass index is 37.37 kg/m as calculated from the following:    Height as of this encounter: 5' (1.524 m).   Weight as of this encounter: 86.8 kg.    Nutritional Assessment: Body mass index is 37.37 kg/m.Aaron Aas Seen by dietician.  I agree with the assessment and plan as outlined below: Nutrition Status:        . Skin Assessment: I have examined the patient's skin and I agree with the wound assessment as performed by the wound care RN as outlined below:    Consultants:  Orthopedics  Procedures:  None  Antimicrobials:  Anti-infectives (From admission, onward)    None         Subjective: Patient seen and examined.  Other than left upper extremity pain, no other complaint.  I reminded her that she can call the nurse whenever she needs pain medications as those are ordered as needed.  Objective: Vitals:   10/22/23 1501 10/22/23 1945 10/23/23 0416 10/23/23 0735  BP: (!) 109/57 124/60 126/61 (!) 107/59  Pulse: 78 76 81 81  Resp: 15 15 16    Temp: 98.7 F (37.1 C) 99.4 F (37.4 C) 98 F (36.7 C) 99.1 F (37.3 C)  TempSrc: Oral   Oral  SpO2: 98% 93% 91% 96%  Weight:      Height:        Intake/Output Summary (Last 24 hours) at 10/23/2023 0747 Last data filed at 10/23/2023 0100 Gross per 24 hour  Intake 895 ml  Output 1000 ml  Net -105 ml   [employer]  10/22/23 0131  Weight: 86.8 kg    Examination:  General exam: Appears calm and comfortable  Respiratory system: Clear to auscultation. Respiratory effort normal. Cardiovascular system: S1 & S2 heard, RRR. No JVD, murmurs, rubs, gallops or clicks. No pedal edema. Gastrointestinal system: Abdomen is nondistended, soft and nontender. No organomegaly or masses felt. Normal bowel sounds heard. Central nervous system: Alert and oriented. No focal neurological deficits. Extremities: Sling in left upper extremity. Skin: No rashes, lesions or ulcers.  Psychiatry: Judgement and insight appear normal. Mood & affect appropriate.   Data Reviewed: I have personally reviewed following  labs and imaging studies  CBC: Recent Labs  Lab 10/21/23 2029 10/22/23 0510  WBC 6.5 6.1  NEUTROABS 5.3  --   HGB 8.8* 8.0*  HCT 29.4* 26.2*  MCV 98.3 95.3  PLT 83* 76*   Basic Metabolic Panel: Recent Labs  Lab 10/21/23 2029 10/22/23 0510  NA 140 136  K 3.6 3.3*  CL 102 100  CO2 34* 26  GLUCOSE 108* 108*  BUN 17 14  CREATININE 1.11* 0.94  CALCIUM  8.6* 8.2*   GFR: Estimated Creatinine Clearance: 48.3 mL/min (by C-G formula based on SCr of 0.94 mg/dL). Liver Function Tests: Recent Labs  Lab 10/22/23 0510  AST 49*  ALT 41  ALKPHOS 126  BILITOT 0.7  PROT 5.4*  ALBUMIN 2.6*   No results for input(s): "LIPASE", "AMYLASE" in the last 168 hours. No results for input(s): "AMMONIA" in the last 168 hours. Coagulation Profile: Recent Labs  Lab 10/22/23 0510  INR 1.3*   Cardiac Enzymes: Recent Labs  Lab 10/21/23 2029  CKTOTAL 32*   BNP (last 3 results) Recent Labs    08/24/23 1529  PROBNP 1,402*   HbA1C: No results for input(s): "HGBA1C" in the last 72 hours. CBG: Recent Labs  Lab 10/22/23 2115  GLUCAP 105*   Lipid Profile: No results for input(s): "CHOL", "HDL", "LDLCALC", "TRIG", "CHOLHDL", "LDLDIRECT" in the last 72 hours. Thyroid  Function Tests: No results for input(s): "TSH", "T4TOTAL", "FREET4", "T3FREE", "THYROIDAB" in the last 72 hours. Anemia Panel: No results for input(s): "VITAMINB12", "FOLATE", "FERRITIN", "TIBC", "IRON", "RETICCTPCT" in the last 72 hours. Sepsis Labs: No results for input(s): "PROCALCITON", "LATICACIDVEN" in the last 168 hours.  Recent Results (from the past 240 hours)  Surgical PCR screen     Status: Abnormal   Collection Time: 10/22/23  1:46 AM   Specimen: Nasal Mucosa; Nasal Swab  Result Value Ref Range Status   MRSA, PCR NEGATIVE NEGATIVE Final   Staphylococcus aureus POSITIVE (A) NEGATIVE Final    Comment: (NOTE) The Xpert SA Assay (FDA approved for NASAL specimens in patients 50 years of age and older), is  one component of a comprehensive surveillance program. It is not intended to diagnose infection nor to guide or monitor treatment. Performed at [facility], [address], [place], [place] [address]      Radiology Studies: CT Elbow Left Wo Contrast Result Date: 10/22/2023 CLINICAL DATA:  Fall, possible olecranon fracture EXAM: CT OF THE UPPER LEFT EXTREMITY WITHOUT CONTRAST TECHNIQUE: Multidetector CT imaging of the upper left extremity was performed according to the standard protocol. RADIATION DOSE REDUCTION: This exam was performed according to the departmental dose-optimization program which includes automated exposure control, adjustment of the mA and/or kV according to patient size and/or use of iterative reconstruction technique. COMPARISON:  Left elbow radiographs dated 10/21/2023 FINDINGS: No fracture or dislocation is seen. Specifically, the olecranon is intact. Visualized soft tissues are unremarkable.  No elbow joint effusion. Mild subcutaneous stranding/hemorrhage in the posterior upper arm (series 4/image 1), incompletely visualized, likely related to the patient's known shoulder fracture. IMPRESSION: No fracture or dislocation is seen. Specifically, the olecranon is intact. Electronically Signed   By: Zadie Herter M.D.   On: 10/22/2023 01:00   CT PELVIS WO CONTRAST Result Date: 10/22/2023 CLINICAL DATA:  Fall EXAM: CT PELVIS WITHOUT CONTRAST TECHNIQUE: Multidetector CT imaging of the pelvis was performed following the standard protocol without intravenous contrast. RADIATION DOSE REDUCTION: This exam was performed according to the departmental dose-optimization program which includes automated exposure control, adjustment of the mA and/or kV according to patient size and/or use of iterative reconstruction technique. COMPARISON:  Left hip radiograph dated 10/21/2023 FINDINGS: Urinary Tract: Excretory contrast in the bladder, within normal limits. Bowel:  Grossly unremarkable.  Vascular/Lymphatic: Atherosclerotic calcifications of the abdominal aorta and branch vessels. No suspicious pelvic lymphadenopathy. Reproductive:  Status post hysterectomy. Right ovary is within normal limits.  No left adnexal mass. Other:  No pelvic ascites. Musculoskeletal: No fracture or dislocation is seen. Specifically, the greater trochanter of the left hip appears intact. Visualized bony pelvis is intact. IMPRESSION: No fracture or dislocation is seen. Specifically, the greater trochanter of the left hip appears intact. Electronically Signed   By: Zadie Herter M.D.   On: 10/22/2023 00:57   CT Head Wo Contrast Result Date: 10/21/2023 CLINICAL DATA:  glf EXAM: CT HEAD WITHOUT CONTRAST CT CERVICAL SPINE WITHOUT CONTRAST TECHNIQUE: Multidetector CT imaging of the head and cervical spine was performed following the standard protocol without intravenous contrast. Multiplanar CT image reconstructions of the cervical spine were also generated. RADIATION DOSE REDUCTION: This exam was performed according to the departmental dose-optimization program which includes automated exposure control, adjustment of the mA and/or kV according to patient size and/or use of iterative reconstruction technique. COMPARISON:  CT head and max face 05/13/2023 FINDINGS: CT HEAD FINDINGS Brain: Patchy and confluent areas of decreased attenuation are noted throughout the deep and periventricular white matter of the cerebral hemispheres bilaterally, compatible with chronic microvascular ischemic disease. No evidence of large-territorial acute infarction. No parenchymal hemorrhage. No mass lesion. No extra-axial collection. No mass effect or midline shift. No hydrocephalus. Basilar cisterns are patent. Partially empty sella. Vascular: No hyperdense vessel. Atherosclerotic calcifications are present within the cavernous internal carotid and vertebral arteries. Skull: No acute fracture or focal lesion. Sinuses/Orbits: Bilateral ethmoid  mucosal thickening. Otherwise paranasal sinuses and mastoid air cells are clear. The orbits are unremarkable. Other: None. CT CERVICAL SPINE FINDINGS Alignment: Normal. Skull base and vertebrae: No acute fracture. No aggressive appearing focal osseous lesion or focal pathologic process. Soft tissues and spinal canal: No prevertebral fluid or swelling. No visible canal hematoma. Upper chest: Unremarkable. Other: Atherosclerotic plaque of the aortic arch and its branches. Slightly heterogeneous nonenlarged thyroid  glands with associated calcifications-no further follow-up indicated. IMPRESSION: 1. No acute intracranial abnormality. 2. No acute displaced fracture or traumatic listhesis of the cervical spine. 3. Partial empty sella. Findings is often a normal anatomic variant but can be associated with idiopathic intracranial hypertension (pseudotumor cerebri). 4.  Aortic Atherosclerosis (ICD10-I70.0). Electronically Signed   By: Morgane  Naveau M.D.   On: 10/21/2023 22:12   CT Cervical Spine Wo Contrast Result Date: 10/21/2023 CLINICAL DATA:  glf EXAM: CT HEAD WITHOUT CONTRAST CT CERVICAL SPINE WITHOUT CONTRAST TECHNIQUE: Multidetector CT imaging of the head and cervical spine was performed following the standard protocol without intravenous contrast. Multiplanar CT image reconstructions of the cervical  spine were also generated. RADIATION DOSE REDUCTION: This exam was performed according to the departmental dose-optimization program which includes automated exposure control, adjustment of the mA and/or kV according to patient size and/or use of iterative reconstruction technique. COMPARISON:  CT head and max face 05/13/2023 FINDINGS: CT HEAD FINDINGS Brain: Patchy and confluent areas of decreased attenuation are noted throughout the deep and periventricular white matter of the cerebral hemispheres bilaterally, compatible with chronic microvascular ischemic disease. No evidence of large-territorial acute infarction.  No parenchymal hemorrhage. No mass lesion. No extra-axial collection. No mass effect or midline shift. No hydrocephalus. Basilar cisterns are patent. Partially empty sella. Vascular: No hyperdense vessel. Atherosclerotic calcifications are present within the cavernous internal carotid and vertebral arteries. Skull: No acute fracture or focal lesion. Sinuses/Orbits: Bilateral ethmoid mucosal thickening. Otherwise paranasal sinuses and mastoid air cells are clear. The orbits are unremarkable. Other: None. CT CERVICAL SPINE FINDINGS Alignment: Normal. Skull base and vertebrae: No acute fracture. No aggressive appearing focal osseous lesion or focal pathologic process. Soft tissues and spinal canal: No prevertebral fluid or swelling. No visible canal hematoma. Upper chest: Unremarkable. Other: Atherosclerotic plaque of the aortic arch and its branches. Slightly heterogeneous nonenlarged thyroid  glands with associated calcifications-no further follow-up indicated. IMPRESSION: 1. No acute intracranial abnormality. 2. No acute displaced fracture or traumatic listhesis of the cervical spine. 3. Partial empty sella. Findings is often a normal anatomic variant but can be associated with idiopathic intracranial hypertension (pseudotumor cerebri). 4.  Aortic Atherosclerosis (ICD10-I70.0). Electronically Signed   By: Morgane  Naveau M.D.   On: 10/21/2023 22:12   DG Knee 2 Views Left Result Date: 10/21/2023 CLINICAL DATA:  GLF onto shoulder EXAM: LEFT KNEE - 1-2 VIEW COMPARISON:  X-ray left knee 05/11/2023 FINDINGS: No evidence of fracture, dislocation, or joint effusion. No evidence of arthropathy or other focal bone abnormality. Soft tissues are unremarkable. Vascular calcification. IMPRESSION: No acute displaced fracture or dislocation. Electronically Signed   By: Morgane  Naveau M.D.   On: 10/21/2023 21:54   DG Hip Unilat W or Wo Pelvis 2-3 Views Left Result Date: 10/21/2023 CLINICAL DATA:  GLF onto shoulder EXAM: DG  HIP (WITH OR WITHOUT PELVIS) 2-3V LEFT COMPARISON:  CT abdomen pelvis 07/20/2023 FINDINGS: Limited evaluation due to overlapping osseous structures and overlying soft tissues. Cortical irregularity along the left greater trochanter with acute nondisplaced fracture not excluded. No left hip dislocation. No acute displaced fracture or dislocation of the right hip on frontal view. No acute displaced fracture or diastasis of the bones of the pelvis. There is no evidence of hip fracture or dislocation. There is no evidence of arthropathy or other focal bone abnormality. Left hip subcutaneus soft tissue edema. Vascular calcification. IMPRESSION: Cortical irregularity along the left greater trochanter with acute nondisplaced fracture not excluded. Correlate with point tenderness to palpation. Electronically Signed   By: Morgane  Naveau M.D.   On: 10/21/2023 21:53   DG Elbow 2 Views Left Result Date: 10/21/2023 CLINICAL DATA:  GLF onto shoulder EXAM: LEFT ELBOW - 2 VIEW COMPARISON:  None Available. FINDINGS: Slightly limited evaluation due to nonstandard lateral view. There is no evidence of definite acute displaced fracture or dislocation. Vague cortical irregularity along the olecranon coronoid process. Limited evaluation for joint effusion there is no evidence of arthropathy or other focal bone abnormality. Soft tissues are unremarkable. IMPRESSION: Slightly limited evaluation due to nonstandard lateral view. Vague cortical irregularity along the olecranon coronoid process. Finding could represent an avulsion fracture. Electronically Signed   By: Morgane  Miachel Adams M.D.   On: 10/21/2023 21:50   DG Humerus Left Result Date: 10/21/2023 CLINICAL DATA:  GLF onto shoulder EXAM: LEFT HUMERUS - 2+ VIEW COMPARISON:  X-ray left shoulder 10/21/2023. FINDINGS: Redemonstration of proximal left humeral fracture better evaluated on x-ray left shoulder 10/21/2023. There is no evidence of other humeral fracture or other focal bone  lesions. Elbow is grossly unremarkable. Soft tissues are unremarkable. IMPRESSION: 1. Redemonstration of proximal left humeral fracture better evaluated on x-ray left shoulder 10/21/2023. 2. No other acute humeral fracture. Electronically Signed   By: Morgane  Naveau M.D.   On: 10/21/2023 21:48   DG Shoulder Left Result Date: 10/21/2023 CLINICAL DATA:  GLF onto shoulder EXAM: LEFT SHOULDER - 2+ VIEW COMPARISON:  CT chest 03/29/2017 FINDINGS: No dislocation. Acute displaced and comminuted impacted left humeral head and neck fracture. Mild acromioclavicular joint and at least mild glenohumeral joint degenerative changes. No aggressive appearing osseous lesion. Subcutaneus soft tissue edema IMPRESSION: Acute displaced and comminuted impacted left humeral head and neck fracture. Electronically Signed   By: Morgane  Naveau M.D.   On: 10/21/2023 21:48    Scheduled Meds:  allopurinol   300 mg Oral Daily   amiodarone   200 mg Oral Daily   atorvastatin   20 mg Oral Daily   busPIRone   10 mg Oral BID   chlorthalidone   25 mg Oral Daily   cyanocobalamin   1,000 mcg Oral Daily   diltiazem   120 mg Oral Daily   DULoxetine   20 mg Oral Daily   enoxaparin  (LOVENOX ) injection  40 mg Subcutaneous Daily   ferrous sulfate   325 mg Oral Q breakfast   folic acid   1 mg Oral Daily   levothyroxine   100 mcg Oral QAC breakfast   mupirocin ointment  1 Application Nasal BID   pantoprazole   40 mg Oral BID   sertraline   25 mg Oral QHS   sodium chloride  flush  3 mL Intravenous Q12H   torsemide   20 mg Oral Daily   Continuous Infusions:     LOS: 2 days   Modena Andes, MD [facility]  10/23/2023, 7:47 AM   *Please note that this is a verbal dictation therefore any spelling or grammatical errors are due to the "[employer] One" system interpretation.  Please page via Amion and do not message via secure chat for urgent patient care matters. Secure chat can be used for non urgent patient care matters.  How to  contact the [facility] Attending or Consulting provider 7A - 7P or covering provider during after hours 7P -7A, for this patient?  Check the care team in [facility] and look for a) attending/consulting [facility] provider listed and b) the [facility] team listed. Page or secure chat 7A-7P. Log into [email or web address] and use [place]'s universal password to access. If you do not have the password, please contact the hospital operator. Locate the [facility] provider you are looking for under [facility] and page to a number that you can be directly reached. If you still have difficulty reaching the provider, please page the [facility] (Director on Call) for the Hospitalists listed on amion for assistance.

## 2023-10-24 DIAGNOSIS — Y92009 Unspecified place in unspecified non-institutional (private) residence as the place of occurrence of the external cause: Secondary | ICD-10-CM | POA: Diagnosis not present

## 2023-10-24 DIAGNOSIS — W19XXXA Unspecified fall, initial encounter: Secondary | ICD-10-CM | POA: Diagnosis not present

## 2023-10-24 DIAGNOSIS — S42202A Unspecified fracture of upper end of left humerus, initial encounter for closed fracture: Secondary | ICD-10-CM | POA: Diagnosis not present

## 2023-10-24 LAB — CBC WITH DIFFERENTIAL/PLATELET
Abs Immature Granulocytes: 0.09 10*3/uL — ABNORMAL HIGH (ref 0.00–0.07)
Abs Immature Granulocytes: 0.06 10*3/uL (ref 0.00–0.07)
Abs Immature Granulocytes: 0.12 10*3/uL — ABNORMAL HIGH (ref 0.00–0.07)
Basophils Absolute: 0 10*3/uL (ref 0.0–0.1)
Basophils Absolute: 0 10*3/uL (ref 0.0–0.1)
Basophils Absolute: 0 10*3/uL (ref 0.0–0.1)
Basophils Relative: 0 %
Basophils Relative: 0 %
Basophils Relative: 0 %
Eosinophils Absolute: 0.1 10*3/uL (ref 0.0–0.5)
Eosinophils Absolute: 0.1 10*3/uL (ref 0.0–0.5)
Eosinophils Absolute: 0.1 10*3/uL (ref 0.0–0.5)
Eosinophils Relative: 1 %
Eosinophils Relative: 2 %
Eosinophils Relative: 1 %
HCT: 24.1 % — ABNORMAL LOW (ref 36.0–46.0)
HCT: 24.5 % — ABNORMAL LOW (ref 36.0–46.0)
HCT: 25.8 % — ABNORMAL LOW (ref 36.0–46.0)
Hemoglobin: 7.5 g/dL — ABNORMAL LOW (ref 12.0–15.0)
Hemoglobin: 7.7 g/dL — ABNORMAL LOW (ref 12.0–15.0)
Hemoglobin: 8 g/dL — ABNORMAL LOW (ref 12.0–15.0)
Immature Granulocytes: 1 %
Immature Granulocytes: 1 %
Immature Granulocytes: 1 %
Lymphocytes Relative: 7 %
Lymphocytes Relative: 6 %
Lymphocytes Relative: 6 %
Lymphs Abs: 0.5 10*3/uL — ABNORMAL LOW (ref 0.7–4.0)
Lymphs Abs: 0.5 10*3/uL — ABNORMAL LOW (ref 0.7–4.0)
Lymphs Abs: 0.6 10*3/uL — ABNORMAL LOW (ref 0.7–4.0)
MCH: 28.8 pg (ref 26.0–34.0)
MCH: 29.4 pg (ref 26.0–34.0)
MCH: 29.3 pg (ref 26.0–34.0)
MCHC: 31.1 g/dL (ref 30.0–36.0)
MCHC: 31.4 g/dL (ref 30.0–36.0)
MCHC: 31 g/dL (ref 30.0–36.0)
MCV: 92.7 fL (ref 80.0–100.0)
MCV: 93.5 fL (ref 80.0–100.0)
MCV: 94.5 fL (ref 80.0–100.0)
Monocytes Absolute: 0.9 10*3/uL (ref 0.1–1.0)
Monocytes Absolute: 0.7 10*3/uL (ref 0.1–1.0)
Monocytes Absolute: 1.1 10*3/uL — ABNORMAL HIGH (ref 0.1–1.0)
Monocytes Relative: 12 %
Monocytes Relative: 10 %
Monocytes Relative: 12 %
Neutro Abs: 5.8 10*3/uL (ref 1.7–7.7)
Neutro Abs: 6.1 10*3/uL (ref 1.7–7.7)
Neutro Abs: 6.9 10*3/uL (ref 1.7–7.7)
Neutrophils Relative %: 79 %
Neutrophils Relative %: 81 %
Neutrophils Relative %: 80 %
Platelets: 87 10*3/uL — ABNORMAL LOW (ref 150–400)
Platelets: 93 10*3/uL — ABNORMAL LOW (ref 150–400)
Platelets: 98 10*3/uL — ABNORMAL LOW (ref 150–400)
RBC: 2.6 MIL/uL — ABNORMAL LOW (ref 3.87–5.11)
RBC: 2.62 MIL/uL — ABNORMAL LOW (ref 3.87–5.11)
RBC: 2.73 MIL/uL — ABNORMAL LOW (ref 3.87–5.11)
RDW: 15.6 % — ABNORMAL HIGH (ref 11.5–15.5)
RDW: 15.5 % (ref 11.5–15.5)
RDW: 15.4 % (ref 11.5–15.5)
WBC: 7.4 10*3/uL (ref 4.0–10.5)
WBC: 7.5 10*3/uL (ref 4.0–10.5)
WBC: 8.8 10*3/uL (ref 4.0–10.5)
nRBC: 0 % (ref 0.0–0.2)
nRBC: 0 % (ref 0.0–0.2)
nRBC: 0 % (ref 0.0–0.2)

## 2023-10-24 LAB — HEMOGLOBIN AND HEMATOCRIT, BLOOD
HCT: 25.9 % — ABNORMAL LOW (ref 36.0–46.0)
Hemoglobin: 7.9 g/dL — ABNORMAL LOW (ref 12.0–15.0)

## 2023-10-24 LAB — BASIC METABOLIC PANEL WITH GFR
Anion gap: 8 (ref 5–15)
BUN: 23 mg/dL (ref 8–23)
CO2: 25 mmol/L (ref 22–32)
Calcium: 7.7 mg/dL — ABNORMAL LOW (ref 8.9–10.3)
Chloride: 99 mmol/L (ref 98–111)
Creatinine, Ser: 1.26 mg/dL — ABNORMAL HIGH (ref 0.44–1.00)
GFR, Estimated: 44 mL/min — ABNORMAL LOW (ref >60)
Glucose, Bld: 94 mg/dL (ref 70–99)
Potassium: 3.5 mmol/L (ref 3.5–5.1)
Sodium: 132 mmol/L — ABNORMAL LOW (ref 135–145)

## 2023-10-24 LAB — MAGNESIUM: Magnesium: 1.3 mg/dL — ABNORMAL LOW (ref 1.7–2.4)

## 2023-10-24 MED ORDER — MAGNESIUM SULFATE 4 GM/100ML IV SOLN
4.0000 g | Freq: Once | INTRAVENOUS | Status: DC
  Filled 2023-10-24: qty 100

## 2023-10-24 MED ORDER — OXYCODONE HCL 5 MG PO TABS
5.0000 mg | ORAL_TABLET | ORAL | Status: DC | PRN
  Administered 2023-10-24 – 2023-10-25 (×6): 5 mg via ORAL
  Filled 2023-10-24 (×7): qty 1

## 2023-10-24 NOTE — Progress Notes (Signed)
 PROGRESS NOTE    Peggy Armstrong  UJW:[id number] DOB: 03/22/1946 DOA: 10/21/2023 PCP: Cherylene Corrente, PA-C   Brief Narrative:  Peggy Armstrong is a 78 y.o. female with medical history significant HFpEF, paroxysmal atrial fibrillation on Eliquis , AAA, essential hypertension, history of recurrent GI bleed, rheumatoid arthritis, CKD stage IIIa, depression, anxiety, hypothyroidism, DVT, hyperlipidemia.  Patient has been recently hospitalized to [facility] discharged on 10/19/2023.  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   Per chart review of [facility] general surgery note recommended to hold  Eliquis  10 days postprocedure starting from 10/19/2023.   Patient presented to emergency department 10/21/2023 for evaluation for fall.  She reported that when she stood up from the wheelchair lost balance and fell on the left side.  Reported hitting of the head.  Since the fall endorsing left-sided shoulder and knee pain.  Reported she was on the ground for almost 1 hour before family assisted her to bed.  No other symptoms.  Upon arrival, she was hemodynamically stable.  X-ray of the left shoulder revealed acute displaced and comminuted impacted left humeral head and neck fracture.  X-ray of the elbow showed possible avulsion fracture.  Rest of the imaging studies including CT scan of the head and cervical spine were unremarkable.ED physician consulted orthopedist Dr. Charol Copas. Patient was eventually admitted under hospitalist service.  Assessment & Plan:   Principal Problem:   Fall at home, initial encounter Active Problems:   Left humeral fracture   Left elbow avulsion fracture   Duodenal adenocarcinoma (HCC)   CKD (chronic kidney disease) stage 3, GFR 30-59 ml/min (HCC)    Essential hypertension   Chronic diastolic CHF (congestive heart failure) (HCC)   History of DVT (deep vein thrombosis)   Paroxysmal atrial fibrillation (HCC)   AAA (abdominal aortic aneurysm) (HCC)   Hyperlipidemia   GAD (generalized anxiety disorder)   Hypothyroidism   History of GI bleed   History of depression  Mechanical fall at home home leading to left humeral head fracture, POA: X-ray of the shoulder showed acute displaced communicated left femoral head and neck fracture. X-ray of the left elbow showed avulsion fracture however CT left elbow ruled out avulsion fracture. - X-ray of the left hip showed left greater trochanter questionable fracture but CT of the pelvis ruled out greater trochanter fracture. - CT head and CT cervical spine unremarkable -ED physician consulted orthopedist Dr. Charol Copas recommended sling placement, not weightbearing of the left upper extremity.  Dr. Charol Copas recommended f/u with Dr. Hiram Lukes in 5-7 days post dc. Call [phone] to schedule.  -Left-sided shoulder sling has been placed.  Continue nonweightbearing of the left upper extremity.  Patient still with significant pain and does not feel comfortable going home.  Patient seen by PT OT and they recommend SNF.  TOC consulted.   History of GI bleed and anemia of chronic disease/ New diagnosis of gastric adenocarcinoma -Patient has history of hemorrhagic shock and recurrent GI bleed.  She underwent CT abdomen pelvis 4/25 which showed duodenal lesion. Patient underwent biopsy by surgical oncology which showed poorly differentiated mucinous adenocarcinoma. -Surgical oncology has consulted while at [facility].  Pending further recommendation - Patient has been managed with IV Protonix  twice daily recommended to continue PPI twice daily on discharge.  Slight drop in hemoglobin down to 7.5.  I  ordered labs to be drawn for noon but for some reason the lab was drawn before 9 AM which showed improved hemoglobin of 7.9.  Per  TOC, they will start the process for SNF today so she will likely not be discharged today.  In that case, I will recheck her hemoglobin little later afternoon today and tomorrow morning.   Hypomagnesemia: Replenished.   Essential hypertension Chronic diastolic heart failure preserved EF 60 to 65% - Per chart review of discharge summary from [facility] patient supposed to be on Cardizem  240 mg daily daily along with chlorthalidone , losartan , both Lasix  and torsemide ?.  Currently blood pressure is borderline soft and heart rate around 80. - Starting Cardizem  on 120 mg daily and chlorthalidone  25 mg daily.  Continuing torsemide .  If blood pressure continue to trend can resume losartan .  If develops tachycardia can increase the dose of Cardizem  to 240 mg.  However currently both blood pressure and heart rate controlled so we will continue current management.   Paroxysmal atrial fibrillation - Continue Cardizem  120 mg daily and amiodarone  200 mg daily.  Based on the heart rate trend can increase the dose of Cardizem  to 240 mg. -History of recent GI bleed and hemorrhagic shock.  Per chart review of the [facility] oncology surgeon note recommended to hold Eliquis  10 days starting from 10/19/2023.   History of DVT - Eliquis  on hold for 10 days starting from 4/24   Hyperlipidemia -Continue Lipitor   Hypothyroidism -Continue levothyroxine    Generalized anxiety disorder History of depression -Continue Cymbalta , Zoloft  and BuSpar    CKD stage III 3a -Creatinine 1.1 and GFR 51.  Renal function at baseline.   Gout -Continue allopurinol .   Reactive airway disease -Continue albuterol  as needed  Hypokalemia: Replenished.  DVT prophylaxis: I placed this patient on DVT prophylaxis dose of Lovenox  but family and patient refused that and they only wanted SCD for now.   Code Status: Full Code  Family Communication: Daughter present at bedside.  Plan of care discussed with patient in length and he/she verbalized  understanding and agreed with it.   Status is: Inpatient Remains inpatient appropriate because: Patient is medically stable since 10/22/2023, pending placement to SNF.     Estimated body mass index is 37.37 kg/m as calculated from the following:   Height as of this encounter: 5' (1.524 m).   Weight as of this encounter: 86.8 kg.    Nutritional Assessment: Body mass index is 37.37 kg/m.Aaron Aas Seen by dietician.  I agree with the assessment and plan as outlined below: Nutrition Status:        . Skin Assessment: I have examined the patient's skin and I agree with the wound assessment as performed by the wound care RN as outlined below:    Consultants:  Orthopedics  Procedures:  None  Antimicrobials:  Anti-infectives (From admission, onward)    None         Subjective: Patient seen and examined.  She is still complaining of left upper extremity pain and is requesting to increase her pain medication frequency to every 4 hours instead of every 6 hours.  I have done that.  Objective: Vitals:   10/23/23 1347 10/23/23 2009 10/24/23 0443 10/24/23 0729  BP: (!) 91/52 (!) 107/52 121/65 115/62  Pulse: 78 74 74 77  Resp:  16 20 17   Temp: 98.6 F (37 C) 98.6 F (37 C) 98.1 F (36.7 C) 99.6 F (37.6 C)  TempSrc: Oral     SpO2: 96% 98% 93%  93%  Weight:      Height:        Intake/Output Summary (Last 24 hours) at 10/24/2023 0824 Last data filed at 10/23/2023 2256 Gross per 24 hour  Intake 173 ml  Output 600 ml  Net -427 ml   Filed Weights   10/22/23 0131  Weight: 86.8 kg    Examination:  General exam: Appears calm and comfortable  Respiratory system: Clear to auscultation. Respiratory effort normal. Cardiovascular system: S1 & S2 heard, RRR. No JVD, murmurs, rubs, gallops or clicks. No pedal edema. Gastrointestinal system: Abdomen is nondistended, soft and nontender. No organomegaly or masses felt. Normal bowel sounds heard. Central nervous system: Alert and  oriented. No focal neurological deficits. Extremities: Left upper extremity in sling. Skin: No rashes, lesions or ulcers.  Psychiatry: Judgement and insight appear normal. Mood & affect appropriate.   Data Reviewed: I have personally reviewed following labs and imaging studies  CBC: Recent Labs  Lab 10/21/23 2029 10/22/23 0510 10/23/23 0647 10/24/23 0636  WBC 6.5 6.1 10.2 7.4  NEUTROABS 5.3  --  8.3* 5.8  HGB 8.8* 8.0* 8.1* 7.5*  HCT 29.4* 26.2* 26.1* 24.1*  MCV 98.3 95.3 93.9 92.7  PLT 83* 76* 77* 87*   Basic Metabolic Panel: Recent Labs  Lab 10/21/23 2029 10/22/23 0510 10/23/23 0647 10/24/23 0636  NA 140 136 133* 132*  K 3.6 3.3* 3.3* 3.5  CL 102 100 97* 99  CO2 34* 26 25 25   GLUCOSE 108* 108* 97 94  BUN 17 14 15 23   CREATININE 1.11* 0.94 1.07* 1.26*  CALCIUM  8.6* 8.2* 8.1* 7.7*  MG  --   --   --  1.3*   GFR: Estimated Creatinine Clearance: 36 mL/min (A) (by C-G formula based on SCr of 1.26 mg/dL (H)). Liver Function Tests: Recent Labs  Lab 10/22/23 0510  AST 49*  ALT 41  ALKPHOS 126  BILITOT 0.7  PROT 5.4*  ALBUMIN 2.6*   No results for input(s): "LIPASE", "AMYLASE" in the last 168 hours. No results for input(s): "AMMONIA" in the last 168 hours. Coagulation Profile: Recent Labs  Lab 10/22/23 0510  INR 1.3*   Cardiac Enzymes: Recent Labs  Lab 10/21/23 2029  CKTOTAL 32*   BNP (last 3 results) Recent Labs    08/24/23 1529  PROBNP 1,402*   HbA1C: No results for input(s): "HGBA1C" in the last 72 hours. CBG: Recent Labs  Lab 10/22/23 2115  GLUCAP 105*   Lipid Profile: No results for input(s): "CHOL", "HDL", "LDLCALC", "TRIG", "CHOLHDL", "LDLDIRECT" in the last 72 hours. Thyroid  Function Tests: No results for input(s): "TSH", "T4TOTAL", "FREET4", "T3FREE", "THYROIDAB" in the last 72 hours. Anemia Panel: No results for input(s): "VITAMINB12", "FOLATE", "FERRITIN", "TIBC", "IRON", "RETICCTPCT" in the last 72 hours. Sepsis Labs: No  results for input(s): "PROCALCITON", "LATICACIDVEN" in the last 168 hours.  Recent Results (from the past 240 hours)  Surgical PCR screen     Status: Abnormal   Collection Time: 10/22/23  1:46 AM   Specimen: Nasal Mucosa; Nasal Swab  Result Value Ref Range Status   MRSA, PCR NEGATIVE NEGATIVE Final   Staphylococcus aureus POSITIVE (A) NEGATIVE Final    Comment: (NOTE) The Xpert SA Assay (FDA approved for NASAL specimens in patients 71 years of age and older), is one component of a comprehensive surveillance program. It is not intended to diagnose infection nor to guide or monitor treatment. Performed at [facility], [address] [place], [place] [address]  Radiology Studies: No results found.   Scheduled Meds:  allopurinol   300 mg Oral Daily   amiodarone   200 mg Oral Daily   atorvastatin   20 mg Oral Daily   busPIRone   10 mg Oral BID   chlorthalidone   25 mg Oral Daily   cyanocobalamin   1,000 mcg Oral Daily   diltiazem   120 mg Oral Daily   DULoxetine   20 mg Oral Daily   enoxaparin  (LOVENOX ) injection  40 mg Subcutaneous Daily   ferrous sulfate   325 mg Oral Q breakfast   folic acid   1 mg Oral Daily   levothyroxine   100 mcg Oral QAC breakfast   mupirocin ointment  1 Application Nasal BID   pantoprazole   40 mg Oral BID   sertraline   25 mg Oral QHS   sodium chloride  flush  3 mL Intravenous Q12H   torsemide   20 mg Oral Daily   Continuous Infusions:  magnesium  sulfate bolus IVPB        LOS: 3 days   Modena Andes, MD [facility]  10/24/2023, 8:24 AM   *Please note that this is a verbal dictation therefore any spelling or grammatical errors are due to the "Dragon Medical One" system interpretation.  Please page via Amion and do not message via secure chat for urgent patient care matters. Secure chat can be used for non urgent patient care matters.  How to contact the [facility] Attending or Consulting provider 7A - 7P or covering provider during after  hours 7P -7A, for this patient?  Check the care team in [facility] and look for a) attending/consulting [facility] provider listed and b) the [facility] team listed. Page or secure chat 7A-7P. Log into [email or web address] and use [place] universal password to access. If you do not have the password, please contact the hospital operator. Locate the [facility] provider you are looking for under [facility] and page to a number that you can be directly reached. If you still have difficulty reaching the provider, please page the [facility] (Director on Call) for the Hospitalists listed on amion for assistance.

## 2023-10-24 NOTE — Progress Notes (Signed)
 Physical Therapy Treatment Patient Details Name: Peggy Armstrong MRN: [id number] DOB: 08-02-1945 Today's Date: 10/24/2023   History of Present Illness 78 y/o F presenting to ED on 4/26 after mechanical fall, fell to L side. Imaging reveals, acute displaced comminuted fx of L humeral head and neck, cortical irregulartity along L greater trochanter with acute nondisplaced fx not excluded, L elbow avulsion fx    PMH includes HFpEF, paroxysmal A fib on Eliquis , AAA, essential HTN, recurrent GIB, RA, CKD IIIA, depression, anxiety, hypothyroidism, DVT, HLD, recently hospitalized at [facility] for duodenal adenocarcinoma.    PT Comments  Continuing work on functional mobility and activity tolerance;  Session focused on safe, functional transfers, and problem-solving for optimal positioning OOB in recliner; Began by assisting pt out of her gown that was wet with urine; Francoise Ishihara assisted with weight shifting as she could, and moving RUE and using RUE to move upper trunk;  Very painful today, in particular L knee pain present; After premedicating for pain, Francoise Ishihara was still painful, but quite motivated to get up and OOB;  Opted to use the Maximove for OOB to recliner today; Max assist to roll and place pad; then pt tolerated Maximove transfer OOB to recliner well;   Once in chair, pt able to weight shift trunk laterally for placement; she performed bilateral quad setting x10, and was pleasantly surprised at how much her L knee did not hurt; Looks really good up and OOB in recliner  Pt and daughter [name] report she was walking household distances with RW before this fall, and she enjoyed working with HHPT; Hopeful for some work on sit<>stand and or basic pivot transfers next session   If plan is discharge home, recommend the following: Two people to help with walking and/or transfers;Two people to help with bathing/dressing/bathroom   Can travel by private vehicle     No  Equipment Recommendations  Wheelchair  (measurements PT);Wheelchair cushion (measurements PT);Other (comment) (will continue to consider unilateral assistive devices for standing activities)    Recommendations for Other Services       Precautions / Restrictions Precautions Precautions: Fall Recall of Precautions/Restrictions: Intact Required Braces or Orthoses: Sling;Spinal Brace Spinal Brace: Lumbar corset;Other (comment) Spinal Brace Comments: pt has been wearing since Nov 2024, hx L2 fx Restrictions LUE Weight Bearing Per Provider Order: Non weight bearing LLE Weight Bearing Per Provider Order: Weight bearing as tolerated     Mobility  Bed Mobility Overal bed mobility: Needs Assistance Bed Mobility: Rolling Rolling: Max assist         General bed mobility comments: max assist today for rolling R and semi-rolling L for Maximove pad placement    Transfers Overall transfer level: Needs assistance Equipment used: Ambulation equipment used Transfers: Bed to chair/wheelchair/BSC             General transfer comment: 2 person assist for using Maximove to assist pt in getting to the chair; Painful, but pt motivated to get up and OOB, and she agreed to push through t get up Transfer via Lift Equipment: Maximove  Ambulation/Gait                   Stairs             Wheelchair Mobility     Tilt Bed    Modified Rankin (Stroke Patients Only)       Balance  Communication Communication Communication: No apparent difficulties  Cognition Arousal: Alert Behavior During Therapy: WFL for tasks assessed/performed                             Following commands: Intact      Cueing Cueing Techniques: Verbal cues  Exercises      General Comments General comments (skin integrity, edema, etc.): [name], [name], present and helful; Pt painful, but able to remain in good spirits enough to exchange jokes and stories       Pertinent Vitals/Pain Pain Assessment Pain Assessment: Faces Faces Pain Scale: Hurts whole lot Pain Location: L shoulder/elbow Pain Descriptors / Indicators: Discomfort Pain Intervention(s): Limited activity within patient's tolerance    Home Living                          Prior Function            PT Goals (current goals can now be found in the care plan section) Acute Rehab PT Goals Patient Stated Goal: less pain, and be able to get up PT Goal Formulation: With patient Time For Goal Achievement: 11/05/23 Potential to Achieve Goals: Good Progress towards PT goals: Progressing toward goals    Frequency    Min 2X/week      PT Plan      Co-evaluation              AM-PAC PT "6 Clicks" Mobility   Outcome Measure  Help needed turning from your back to your side while in a flat bed without using bedrails?: A Lot Help needed moving from lying on your back to sitting on the side of a flat bed without using bedrails?: A Lot Help needed moving to and from a bed to a chair (including a wheelchair)?: Total Help needed standing up from a chair using your arms (e.g., wheelchair or bedside chair)?: Total Help needed to walk in hospital room?: Total Help needed climbing 3-5 steps with a railing? : Total 6 Click Score: 8    End of Session Equipment Utilized During Treatment: Other (comment) (sling, bed pads) Activity Tolerance: Patient limited by pain;Other (comment) (and some syncopal symptoms in sitting) Patient left: in chair;with call bell/phone within reach;with chair alarm set Nurse Communication: Mobility status PT Visit Diagnosis: Unsteadiness on feet (R26.81);Other abnormalities of gait and mobility (R26.89);History of falling (Z91.81);Muscle weakness (generalized) (M62.81);Pain Pain - Right/Left: Left Pain - part of body: Arm;Leg;Hip     Time: 4270-6237 PT Time Calculation (min) (ACUTE ONLY): 60 min  Charges:    $Therapeutic Activity:  53-67 mins PT General Charges $$ ACUTE PT VISIT: 1 Visit                     Darcus Eastern, PT  Acute Rehabilitation Services Office [phone] Secure Chat welcomed    Marcial Setting 10/24/2023, 2:45 PM

## 2023-10-24 NOTE — [place] FL2 (Signed)
 [place]  MEDICAID [id number] LEVEL OF CARE FORM     IDENTIFICATION  Patient Name: Peggy Armstrong Birthdate: 1946-05-25 Sex: female Admission Date (Current Location): 10/21/2023  [place] and [place] Number:  Ernesto Heady [id number] T Facility and Address:  [facility], [address], [place], [place] [address]      Provider Number:    Attending Physician Name and Address:  Modena Andes, MD  Relative Name and Phone Number:  Licausi,Cindy Daughter [phone]    Current Level of Care: Hospital Recommended Level of Care: Skilled Nursing Facility Prior Approval Number:    Date Approved/Denied:   PASRR Number: [id number]  Discharge Plan: SNF    Current Diagnoses: Patient Active Problem List   Diagnosis Date Noted   Fall at home, initial encounter 10/21/2023   Left humeral fracture 10/21/2023   Left elbow avulsion fracture 10/21/2023   History of GI bleed 10/21/2023   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   History of depression 10/21/2023   Atrial fibrillation (HCC)    Back injury    DVT (deep venous thrombosis) (HCC)    Hypertension    Cellulitis of left arm 08/14/2023   Hemorrhagic shock (HCC) 07/21/2023   ABLA (acute blood loss anemia) 07/21/2023   Acute GI bleeding 07/21/2023   AKI (acute kidney injury) (HCC) 07/21/2023   GI bleed 07/20/2023   Chronic low back pain 07/18/2023   Upper GI bleed 06/20/2023   Long term current use of anticoagulant therapy 06/20/2023   Occult blood in stools 05/16/2023   Gastric polyps 05/16/2023   Generalized weakness 05/12/2023   Acute cystitis 05/12/2023   Dehydration 05/12/2023   Acute prerenal azotemia 05/12/2023   Paroxysmal atrial fibrillation (HCC) 05/12/2023   AAA (abdominal aortic aneurysm) (HCC) 05/12/2023   Hyperlipidemia 05/12/2023   GAD (generalized anxiety disorder) 05/12/2023   Hypothyroidism 05/12/2023   Closed compression fracture of L2 lumbar vertebra, initial encounter (HCC)  05/11/2023   Restless leg 12/02/2022   Routine general medical examination at a health care facility 12/13/2019   Chronic diastolic CHF (congestive heart failure) (HCC) 11/08/2019   Morbid obesity (HCC) 11/08/2019   History of DVT (deep vein thrombosis) 11/08/2019   Need for immunization against influenza 07/18/2019   Right shoulder pain 07/26/2018   Need for vaccination 04/23/2018   Duodenal ulcer    Hematemesis 11/05/2017   Macrocytic anemia 11/05/2017   Melena 11/05/2017   Nausea & vomiting    Hypokalemia 07/03/2017   Essential hypertension    Gout    Clotting disorder (HCC)    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   CKD (chronic kidney disease) stage 3, GFR 30-59 ml/min (HCC) 06/09/2017   S/P ERCP 05/05/2017   Peri-ampullary neoplasm    Atypical atrial flutter (HCC)    Duodenal mass    Cholangitis    Epigastric pain    Choledocholithiasis 03/22/2017   SIRS (systemic inflammatory response syndrome) (HCC) 03/22/2017   Poor dentition 08/14/2016   Iron deficiency anemia 08/02/2016   RA (rheumatoid arthritis) (HCC) 09/07/2015    Orientation RESPIRATION BLADDER Height & Weight     Self, Time, Situation, Place  Normal Continent, External catheter Weight: 191 lb 5.8 oz (86.8 kg) Height:  5' (152.4 cm)  BEHAVIORAL SYMPTOMS/MOOD NEUROLOGICAL BOWEL NUTRITION STATUS      Continent Diet (see discharge summary)  AMBULATORY STATUS COMMUNICATION OF NEEDS Skin   Total Care Verbally Other (Comment) (ecchmosis)  Personal Care Assistance Level of Assistance  Bathing, Feeding, Dressing Bathing Assistance: Maximum assistance Feeding assistance: Limited assistance Dressing Assistance: Maximum assistance     Functional Limitations Info  Sight, Hearing, Speech Sight Info: Adequate Hearing Info: Adequate Speech Info: Adequate    SPECIAL CARE FACTORS FREQUENCY  PT (By licensed PT), OT (By licensed OT)     PT Frequency: 5x week OT Frequency: 5x  week            Contractures Contractures Info: Not present    Additional Factors Info  Code Status, Allergies Code Status Info: full Allergies Info: Codeine, Tape           Current Medications (10/24/2023):  This is the current hospital active medication list Current Facility-Administered Medications  Medication Dose Route Frequency Provider Last Rate Last Admin   acetaminophen  (TYLENOL ) tablet 650 mg  650 mg Oral Q6H PRN Sundil, Subrina, MD   650 mg at 10/23/23 1508   Or   acetaminophen  (TYLENOL ) suppository 650 mg  650 mg Rectal Q6H PRN Sundil, Subrina, MD       albuterol  (PROVENTIL ) (2.5 MG/3ML) 0.083% nebulizer solution 2.5 mg  2.5 mg Nebulization Q2H PRN Sundil, Subrina, MD       allopurinol  (ZYLOPRIM ) tablet 300 mg  300 mg Oral Daily Sundil, Subrina, MD   300 mg at 10/24/23 1610   amiodarone  (PACERONE ) tablet 200 mg  200 mg Oral Daily Sundil, Subrina, MD   200 mg at 10/24/23 9604   atorvastatin  (LIPITOR) tablet 20 mg  20 mg Oral Daily Sundil, Subrina, MD   20 mg at 10/24/23 5409   busPIRone  (BUSPAR ) tablet 10 mg  10 mg Oral BID Sundil, Subrina, MD   10 mg at 10/24/23 8119   chlorthalidone  (HYGROTON ) tablet 25 mg  25 mg Oral Daily Sundil, Subrina, MD   25 mg at 10/24/23 1478   cyanocobalamin  (VITAMIN B12) tablet 1,000 mcg  1,000 mcg Oral Daily Sundil, Subrina, MD   1,000 mcg at 10/24/23 2956   diltiazem  (CARDIZEM  CD) 24 hr capsule 120 mg  120 mg Oral Daily Sundil, Subrina, MD   120 mg at 10/24/23 2130   DULoxetine  (CYMBALTA ) DR capsule 20 mg  20 mg Oral Daily Sundil, Subrina, MD   20 mg at 10/24/23 8657   enoxaparin  (LOVENOX ) injection 40 mg  40 mg Subcutaneous Daily Pham, Minh Q, RPH-CPP   40 mg at 10/23/23 8469   ferrous sulfate  tablet 325 mg  325 mg Oral Q breakfast Sundil, Subrina, MD   325 mg at 10/24/23 6295   folic acid  (FOLVITE ) tablet 1 mg  1 mg Oral Daily Sundil, Subrina, MD   1 mg at 10/24/23 2841   levothyroxine  (SYNTHROID ) tablet 100 mcg  100 mcg Oral QAC  breakfast Sundil, Subrina, MD   100 mcg at 10/24/23 3244   magnesium  sulfate IVPB 4 g 100 mL  4 g Intravenous Once Pahwani, Ravi, MD       melatonin tablet 3 mg  3 mg Oral QHS PRN Sundil, Subrina, MD   3 mg at 10/22/23 2111   methocarbamol  (ROBAXIN ) tablet 500 mg  500 mg Oral Q6H PRN Sundil, Subrina, MD   500 mg at 10/23/23 0844   mupirocin ointment (BACTROBAN) 2 % 1 Application  1 Application Nasal BID Sundil, Subrina, MD   1 Application at 10/24/23 0102   ondansetron  (ZOFRAN ) tablet 4 mg  4 mg Oral Q6H PRN Sundil, Subrina, MD       Or   ondansetron  (  ZOFRAN ) injection 4 mg  4 mg Intravenous Q6H PRN Sundil, Subrina, MD       Oral care mouth rinse  15 mL Mouth Rinse PRN Sundil, Subrina, MD       oxyCODONE  (Oxy IR/ROXICODONE ) immediate release tablet 5 mg  5 mg Oral Q6H PRN Sundil, Subrina, MD   5 mg at 10/24/23 1610   pantoprazole  (PROTONIX ) EC tablet 40 mg  40 mg Oral BID Sundil, Subrina, MD   40 mg at 10/24/23 9604   sertraline  (ZOLOFT ) tablet 25 mg  25 mg Oral QHS Sundil, Subrina, MD   25 mg at 10/23/23 2132   sodium chloride  flush (NS) 0.9 % injection 3 mL  3 mL Intravenous Q12H Sundil, Subrina, MD   3 mL at 10/24/23 5409   sodium chloride  flush (NS) 0.9 % injection 3 mL  3 mL Intravenous PRN Sundil, Subrina, MD       torsemide  (DEMADEX ) tablet 20 mg  20 mg Oral Daily Sundil, Subrina, MD   20 mg at 10/24/23 0902     Discharge Medications: Please see discharge summary for a list of discharge medications.  Relevant Imaging Results:  Relevant Lab Results:   Additional Information SSN [id number]  Elspeth Hals, LCSW

## 2023-10-24 NOTE — Plan of Care (Signed)

## 2023-10-24 NOTE — TOC Initial Note (Addendum)
 Transition of Care [facility]) - Initial/Assessment Note    Patient Details  Name: Peggy Armstrong MRN: [id number] Date of Birth: 1945/12/05  Transition of [facility]) CM/SW Contact:    Elspeth Hals, LCSW Phone Number: 10/24/2023, 10:14 AM  Clinical Narrative:     CSW spoke with pt and daughter Peggy Armstrong regarding PT recommendation for SNF.  Pt was recently at [facility] rehab, would like to return there. Permission given to send out referral in hub.  Permission given to speak with both daughters, Peggy Armstrong and Peggy Armstrong.  Pt from home, lives with [place].  Reports HH currently in place, but did not have the agency name.  Referral sent to [facility] rehab, CSW reached out to [facility] to review.    1100: [facility]: they can offer, do have bed available today if auth approved.  SNF auth request submitted in [place].  Pending at this time.        1410: Auth request remains pending.      1610: Auth request remains pending.  New PT note uploaded in [place].    Expected Discharge Plan: Skilled Nursing Facility Barriers to Discharge: SNF Pending bed offer   Patient Goals and CMS Choice     Choice offered to / list presented to : Patient, Adult Children (pt requesting [facility] re[facility])      Expected Discharge Plan and Services In-house Referral: Clinical Social Work   Post Acute Care Choice: Skilled Nursing Facility Living arrangements for the past 2 months: Single Family Home                                      Prior Living Arrangements/Services Living arrangements for the past 2 months: Single Family Home Lives with:: Adult Children (lives with daughter Peggy Armstrong) Patient language and need for interpreter reviewed:: Yes Do you feel safe going back to the place where you live?: Yes      Need for Family Participation in Patient Care: Yes (Comment) Care giver support system in place?: Yes (comment) Current home services: Home OT, Home PT Criminal Activity/Legal Involvement Pertinent  to Current Situation/Hospitalization: No - Comment as needed  Activities of Daily Living   ADL Screening (condition at time of admission) Independently performs ADLs?: No Does the patient have a NEW difficulty with bathing/dressing/toileting/self-feeding that is expected to last >3 days?: Yes (Initiates electronic notice to provider for possible OT consult) Does the patient have a NEW difficulty with getting in/out of bed, walking, or climbing stairs that is expected to last >3 days?: Yes (Initiates electronic notice to provider for possible PT consult) Does the patient have a NEW difficulty with communication that is expected to last >3 days?: Yes (Initiates electronic notice to provider for possible SLP consult) Is the patient deaf or have difficulty hearing?: Yes Does the patient have difficulty seeing, even when wearing glasses/contacts?: No Does the patient have difficulty concentrating, remembering, or making decisions?: No  Permission Sought/Granted Permission sought to share information with : Family Supports Permission granted to share information with : Yes, Verbal Permission Granted  Share Information with NAME: both daughters: Peggy Armstrong and Peggy Armstrong  Permission granted to share info w AGENCY: SNF        Emotional Assessment Appearance:: Appears stated age Attitude/Demeanor/Rapport: Engaged Affect (typically observed): Appropriate, Pleasant Orientation: : Oriented to Self, Oriented to Place, Oriented to  Time, Oriented to Situation      Admission diagnosis:  Multiple fracture [T07.XXXA] Closed fracture of proximal end of left humerus, unspecified fracture morphology, initial encounter [S42.202A] Left humeral fracture [S42.302A] Patient Active Problem List   Diagnosis Date Noted   Fall at home, initial encounter 10/21/2023   Left humeral fracture 10/21/2023   Left elbow avulsion fracture 10/21/2023   History of GI bleed 10/21/2023   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    History of depression 10/21/2023   Atrial fibrillation (HCC)    Back injury    DVT (deep venous thrombosis) (HCC)    Hypertension    Cellulitis of left arm 08/14/2023   Hemorrhagic shock (HCC) 07/21/2023   ABLA (acute blood loss anemia) 07/21/2023   Acute GI bleeding 07/21/2023   AKI (acute kidney injury) (HCC) 07/21/2023   GI bleed 07/20/2023   Chronic low back pain 07/18/2023   Upper GI bleed 06/20/2023   Long term current use of anticoagulant therapy 06/20/2023   Occult blood in stools 05/16/2023   Gastric polyps 05/16/2023   Generalized weakness 05/12/2023   Acute cystitis 05/12/2023   Dehydration 05/12/2023   Acute prerenal azotemia 05/12/2023   Paroxysmal atrial fibrillation (HCC) 05/12/2023   AAA (abdominal aortic aneurysm) (HCC) 05/12/2023   Hyperlipidemia 05/12/2023   GAD (generalized anxiety disorder) 05/12/2023   Hypothyroidism 05/12/2023   Closed compression fracture of L2 lumbar vertebra, initial encounter (HCC) 05/11/2023   Restless leg 12/02/2022   Routine general medical examination at a health care facility 12/13/2019   Chronic diastolic CHF (congestive heart failure) (HCC) 11/08/2019   Morbid obesity (HCC) 11/08/2019   History of DVT (deep vein thrombosis) 11/08/2019   Need for immunization against influenza 07/18/2019   Right shoulder pain 07/26/2018   Need for vaccination 04/23/2018   Duodenal ulcer    Hematemesis 11/05/2017   Macrocytic anemia 11/05/2017   Melena 11/05/2017   Nausea & vomiting    Hypokalemia 07/03/2017   Essential hypertension    Gout    Clotting disorder (HCC)    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 [place] [address] Phone: [phone] Fax: [phone]     Social Drivers of Health (SDOH) Social History: SDOH Screenings   Food Insecurity: No Food Insecurity (10/22/2023)  Housing: Low Risk  (10/22/2023)  Transportation Needs: No Transportation Needs (10/22/2023)  Utilities: Not At Risk (10/22/2023)  Depression (PHQ2-9): Low Risk  (07/31/2023)  Financial Resource Strain: Low Risk  (12/02/2022)   Received from [facility], [facility]  Physical Activity: Unknown (04/05/2022)   Received from [facility], [facility]  Social Connections: Moderately Isolated (10/22/2023)  Stress: No Stress Concern Present (04/05/2022)   Received from [facility], [facility]  Tobacco Use: Medium Risk (10/21/2023)   SDOH Interventions:     Readmission Risk Interventions    07/24/2023    5:23 PM 05/14/2023    6:44 PM  Readmission Risk Prevention Plan  Transportation Screening Complete Complete  PCP or Specialist Appt within 3-5  Days Complete Complete  HRI or Home Care Consult Complete Complete  Social Work Consult for Recovery Care Planning/Counseling Complete Complete  Palliative Care Screening Not Applicable Complete  Medication Review [facility]) Referral to Pharmacy Referral to Pharmacy

## 2023-10-25 DIAGNOSIS — I4891 Unspecified atrial fibrillation: Secondary | ICD-10-CM | POA: Diagnosis not present

## 2023-10-25 DIAGNOSIS — R1319 Other dysphagia: Secondary | ICD-10-CM | POA: Diagnosis not present

## 2023-10-25 DIAGNOSIS — Y92009 Unspecified place in unspecified non-institutional (private) residence as the place of occurrence of the external cause: Secondary | ICD-10-CM | POA: Diagnosis not present

## 2023-10-25 DIAGNOSIS — Z741 Need for assistance with personal care: Secondary | ICD-10-CM | POA: Diagnosis not present

## 2023-10-25 DIAGNOSIS — N1831 Chronic kidney disease, stage 3a: Secondary | ICD-10-CM | POA: Diagnosis not present

## 2023-10-25 DIAGNOSIS — J984 Other disorders of lung: Secondary | ICD-10-CM | POA: Diagnosis not present

## 2023-10-25 DIAGNOSIS — S42202A Unspecified fracture of upper end of left humerus, initial encounter for closed fracture: Secondary | ICD-10-CM | POA: Diagnosis not present

## 2023-10-25 DIAGNOSIS — Z7401 Bed confinement status: Secondary | ICD-10-CM | POA: Diagnosis not present

## 2023-10-25 DIAGNOSIS — E782 Mixed hyperlipidemia: Secondary | ICD-10-CM | POA: Diagnosis not present

## 2023-10-25 DIAGNOSIS — M899 Disorder of bone, unspecified: Secondary | ICD-10-CM | POA: Insufficient documentation

## 2023-10-25 DIAGNOSIS — I5032 Chronic diastolic (congestive) heart failure: Secondary | ICD-10-CM | POA: Diagnosis not present

## 2023-10-25 DIAGNOSIS — S42212D Unspecified displaced fracture of surgical neck of left humerus, subsequent encounter for fracture with routine healing: Secondary | ICD-10-CM | POA: Diagnosis not present

## 2023-10-25 DIAGNOSIS — Z8042 Family history of malignant neoplasm of prostate: Secondary | ICD-10-CM | POA: Diagnosis not present

## 2023-10-25 DIAGNOSIS — R262 Difficulty in walking, not elsewhere classified: Secondary | ICD-10-CM | POA: Diagnosis not present

## 2023-10-25 DIAGNOSIS — Z136 Encounter for screening for cardiovascular disorders: Secondary | ICD-10-CM | POA: Diagnosis not present

## 2023-10-25 DIAGNOSIS — M85812 Other specified disorders of bone density and structure, left shoulder: Secondary | ICD-10-CM | POA: Diagnosis not present

## 2023-10-25 DIAGNOSIS — I482 Chronic atrial fibrillation, unspecified: Secondary | ICD-10-CM | POA: Diagnosis not present

## 2023-10-25 DIAGNOSIS — M109 Gout, unspecified: Secondary | ICD-10-CM | POA: Diagnosis not present

## 2023-10-25 DIAGNOSIS — R6889 Other general symptoms and signs: Secondary | ICD-10-CM | POA: Diagnosis not present

## 2023-10-25 DIAGNOSIS — R799 Abnormal finding of blood chemistry, unspecified: Secondary | ICD-10-CM | POA: Diagnosis not present

## 2023-10-25 DIAGNOSIS — M5459 Other low back pain: Secondary | ICD-10-CM | POA: Diagnosis not present

## 2023-10-25 DIAGNOSIS — C170 Malignant neoplasm of duodenum: Secondary | ICD-10-CM | POA: Diagnosis not present

## 2023-10-25 DIAGNOSIS — N390 Urinary tract infection, site not specified: Secondary | ICD-10-CM | POA: Diagnosis not present

## 2023-10-25 DIAGNOSIS — D500 Iron deficiency anemia secondary to blood loss (chronic): Secondary | ICD-10-CM | POA: Diagnosis not present

## 2023-10-25 DIAGNOSIS — S42432D Displaced fracture (avulsion) of lateral epicondyle of left humerus, subsequent encounter for fracture with routine healing: Secondary | ICD-10-CM | POA: Diagnosis not present

## 2023-10-25 DIAGNOSIS — Z743 Need for continuous supervision: Secondary | ICD-10-CM | POA: Diagnosis not present

## 2023-10-25 DIAGNOSIS — Z87891 Personal history of nicotine dependence: Secondary | ICD-10-CM | POA: Diagnosis not present

## 2023-10-25 DIAGNOSIS — R0902 Hypoxemia: Secondary | ICD-10-CM | POA: Diagnosis not present

## 2023-10-25 DIAGNOSIS — R531 Weakness: Secondary | ICD-10-CM | POA: Diagnosis not present

## 2023-10-25 DIAGNOSIS — M25512 Pain in left shoulder: Secondary | ICD-10-CM | POA: Diagnosis not present

## 2023-10-25 DIAGNOSIS — I10 Essential (primary) hypertension: Secondary | ICD-10-CM | POA: Diagnosis not present

## 2023-10-25 DIAGNOSIS — T148XXA Other injury of unspecified body region, initial encounter: Secondary | ICD-10-CM | POA: Insufficient documentation

## 2023-10-25 DIAGNOSIS — M79605 Pain in left leg: Secondary | ICD-10-CM | POA: Diagnosis not present

## 2023-10-25 DIAGNOSIS — Z86718 Personal history of other venous thrombosis and embolism: Secondary | ICD-10-CM | POA: Diagnosis not present

## 2023-10-25 DIAGNOSIS — K219 Gastro-esophageal reflux disease without esophagitis: Secondary | ICD-10-CM | POA: Diagnosis not present

## 2023-10-25 DIAGNOSIS — H6690 Otitis media, unspecified, unspecified ear: Secondary | ICD-10-CM | POA: Diagnosis not present

## 2023-10-25 DIAGNOSIS — R2681 Unsteadiness on feet: Secondary | ICD-10-CM | POA: Diagnosis not present

## 2023-10-25 DIAGNOSIS — M1612 Unilateral primary osteoarthritis, left hip: Secondary | ICD-10-CM | POA: Diagnosis not present

## 2023-10-25 DIAGNOSIS — M898X9 Other specified disorders of bone, unspecified site: Secondary | ICD-10-CM | POA: Insufficient documentation

## 2023-10-25 DIAGNOSIS — M19072 Primary osteoarthritis, left ankle and foot: Secondary | ICD-10-CM | POA: Diagnosis not present

## 2023-10-25 DIAGNOSIS — R488 Other symbolic dysfunctions: Secondary | ICD-10-CM | POA: Diagnosis not present

## 2023-10-25 DIAGNOSIS — J90 Pleural effusion, not elsewhere classified: Secondary | ICD-10-CM | POA: Diagnosis not present

## 2023-10-25 DIAGNOSIS — S32021D Stable burst fracture of second lumbar vertebra, subsequent encounter for fracture with routine healing: Secondary | ICD-10-CM | POA: Diagnosis not present

## 2023-10-25 DIAGNOSIS — I509 Heart failure, unspecified: Secondary | ICD-10-CM | POA: Diagnosis not present

## 2023-10-25 DIAGNOSIS — R300 Dysuria: Secondary | ICD-10-CM | POA: Diagnosis not present

## 2023-10-25 DIAGNOSIS — E669 Obesity, unspecified: Secondary | ICD-10-CM | POA: Insufficient documentation

## 2023-10-25 DIAGNOSIS — M6281 Muscle weakness (generalized): Secondary | ICD-10-CM | POA: Diagnosis not present

## 2023-10-25 DIAGNOSIS — L299 Pruritus, unspecified: Secondary | ICD-10-CM | POA: Diagnosis not present

## 2023-10-25 DIAGNOSIS — W19XXXA Unspecified fall, initial encounter: Secondary | ICD-10-CM | POA: Diagnosis not present

## 2023-10-25 DIAGNOSIS — D638 Anemia in other chronic diseases classified elsewhere: Secondary | ICD-10-CM | POA: Diagnosis not present

## 2023-10-25 DIAGNOSIS — E039 Hypothyroidism, unspecified: Secondary | ICD-10-CM | POA: Diagnosis not present

## 2023-10-25 DIAGNOSIS — Z800 Family history of malignant neoplasm of digestive organs: Secondary | ICD-10-CM | POA: Diagnosis not present

## 2023-10-25 DIAGNOSIS — R52 Pain, unspecified: Secondary | ICD-10-CM | POA: Diagnosis not present

## 2023-10-25 DIAGNOSIS — Z043 Encounter for examination and observation following other accident: Secondary | ICD-10-CM | POA: Diagnosis not present

## 2023-10-25 DIAGNOSIS — Z7409 Other reduced mobility: Secondary | ICD-10-CM | POA: Diagnosis not present

## 2023-10-25 LAB — BASIC METABOLIC PANEL WITH GFR
Anion gap: 12 (ref 5–15)
BUN: 28 mg/dL — ABNORMAL HIGH (ref 8–23)
CO2: 26 mmol/L (ref 22–32)
Calcium: 8 mg/dL — ABNORMAL LOW (ref 8.9–10.3)
Chloride: 95 mmol/L — ABNORMAL LOW (ref 98–111)
Creatinine, Ser: 1.31 mg/dL — ABNORMAL HIGH (ref 0.44–1.00)
GFR, Estimated: 42 mL/min — ABNORMAL LOW (ref >60)
Glucose, Bld: 92 mg/dL (ref 70–99)
Potassium: 2.9 mmol/L — ABNORMAL LOW (ref 3.5–5.1)
Sodium: 133 mmol/L — ABNORMAL LOW (ref 135–145)

## 2023-10-25 LAB — CBC WITH DIFFERENTIAL/PLATELET
Abs Immature Granulocytes: 0.08 10*3/uL — ABNORMAL HIGH (ref 0.00–0.07)
Basophils Absolute: 0 10*3/uL (ref 0.0–0.1)
Basophils Relative: 0 %
Eosinophils Absolute: 0.1 10*3/uL (ref 0.0–0.5)
Eosinophils Relative: 2 %
HCT: 25.9 % — ABNORMAL LOW (ref 36.0–46.0)
Hemoglobin: 8.1 g/dL — ABNORMAL LOW (ref 12.0–15.0)
Immature Granulocytes: 1 %
Lymphocytes Relative: 7 %
Lymphs Abs: 0.4 10*3/uL — ABNORMAL LOW (ref 0.7–4.0)
MCH: 29.3 pg (ref 26.0–34.0)
MCHC: 31.3 g/dL (ref 30.0–36.0)
MCV: 93.8 fL (ref 80.0–100.0)
Monocytes Absolute: 0.7 10*3/uL (ref 0.1–1.0)
Monocytes Relative: 11 %
Neutro Abs: 5.1 10*3/uL (ref 1.7–7.7)
Neutrophils Relative %: 79 %
Platelets: 118 10*3/uL — ABNORMAL LOW (ref 150–400)
RBC: 2.76 MIL/uL — ABNORMAL LOW (ref 3.87–5.11)
RDW: 15.4 % (ref 11.5–15.5)
WBC: 6.5 10*3/uL (ref 4.0–10.5)
nRBC: 0 % (ref 0.0–0.2)

## 2023-10-25 LAB — MAGNESIUM: Magnesium: 1.5 mg/dL — ABNORMAL LOW (ref 1.7–2.4)

## 2023-10-25 MED ORDER — HYDROXYZINE HCL 10 MG PO TABS
10.0000 mg | ORAL_TABLET | Freq: Three times a day (TID) | ORAL | Status: DC | PRN
  Administered 2023-10-25: 10 mg via ORAL
  Filled 2023-10-25: qty 1

## 2023-10-25 MED ORDER — CAMPHOR-MENTHOL 0.5-0.5 % EX LOTN
1.0000 | TOPICAL_LOTION | CUTANEOUS | Status: DC | PRN
  Administered 2023-10-25: 1 via TOPICAL
  Filled 2023-10-25: qty 222

## 2023-10-25 MED ORDER — MAGNESIUM SULFATE 4 GM/100ML IV SOLN
4.0000 g | Freq: Once | INTRAVENOUS | Status: AC
  Administered 2023-10-25: 4 g via INTRAVENOUS
  Filled 2023-10-25: qty 100

## 2023-10-25 MED ORDER — OXYCODONE HCL 5 MG PO TABS: 5.0000 mg | ORAL_TABLET | ORAL | 0 refills | Status: DC | PRN

## 2023-10-25 MED ORDER — ELIQUIS 5 MG PO TABS: 5.0000 mg | ORAL_TABLET | Freq: Two times a day (BID) | ORAL | Status: DC

## 2023-10-25 MED ORDER — DILTIAZEM HCL ER COATED BEADS 120 MG PO CP24: 120.0000 mg | ORAL_CAPSULE | Freq: Every day | ORAL | 0 refills | Status: DC

## 2023-10-25 MED ORDER — POTASSIUM CHLORIDE CRYS ER 20 MEQ PO TBCR
40.0000 meq | EXTENDED_RELEASE_TABLET | ORAL | Status: AC
  Administered 2023-10-25 (×2): 40 meq via ORAL
  Filled 2023-10-25 (×2): qty 2

## 2023-10-25 NOTE — Progress Notes (Signed)
 Multiple attempts to give report to [facility] and all were unsuccessful. PTAR to transport patient and VAS paperwork accompanying them to be given to [facility].

## 2023-10-25 NOTE — Progress Notes (Signed)
 Occupational Therapy Treatment Patient Details Name: Peggy Armstrong MRN: [id number] DOB: [birth year] Today's Date: 10/25/2023   History of present illness 78 y/o F presenting to ED on 4/26 after mechanical fall, fell to L side. Imaging reveals, acute displaced comminuted fx of L humeral head and neck, cortical irregulartity along L greater trochanter with acute nondisplaced fx not excluded, L elbow avulsion fx    PMH includes HFpEF, paroxysmal A fib on Eliquis , AAA, essential HTN, recurrent GIB, RA, CKD IIIA, depression, anxiety, hypothyroidism, DVT, HLD, recently hospitalized at [facility] for duodenal adenocarcinoma.   OT comments  Pt progressing well towards goals. Session focused on UB dressing. Educated pt and daughter on compensatory technique to assist in dressing, and how to don/doff sling. Continue to recommend <3 hours of skilled rehab daily to optimize independence levels. Will continue to follow acutely.       If plan is discharge home, recommend the following:  Two people to help with walking and/or transfers;A lot of help with bathing/dressing/bathroom;Assistance with cooking/housework;Direct supervision/assist for medications management;Direct supervision/assist for financial management;Assist for transportation;Help with stairs or ramp for entrance   Equipment Recommendations  Other (comment) (Defer to next venue)    Recommendations for Other Services      Precautions / Restrictions Precautions Precautions: Fall Recall of Precautions/Restrictions: Intact Required Braces or Orthoses: Sling;Spinal Brace Spinal Brace: Lumbar corset;Other (comment) Spinal Brace Comments: pt has been wearing since Nov 2024, hx L2 fx Restrictions Weight Bearing Restrictions Per Provider Order: Yes LUE Weight Bearing Per Provider Order: Non weight bearing LLE Weight Bearing Per Provider Order: Weight bearing as tolerated       Mobility Bed Mobility Overal bed mobility: Needs Assistance Bed  Mobility: Rolling, Supine to Sit Rolling: Max assist, +2 for physical assistance, +2 for safety/equipment   Supine to sit: Max assist, +2 for physical assistance, +2 for safety/equipment, HOB elevated, Used rails     General bed mobility comments: Max assist to roll for donning brief    Transfers Overall transfer level: Needs assistance       Balance Overall balance assessment: Needs assistance Sitting-balance support: Feet supported Sitting balance-Leahy Scale: Poor Sitting balance - Comments: Sat EOB with mod to CGA, c/o of dizziness     ADL either performed or assessed with clinical judgement   ADL Overall ADL's : Needs assistance/impaired   Lower Body Bathing: Bed level;Total assistance Lower Body Bathing Details (indicate cue type and reason): To perform peri hygiene Upper Body Dressing : Maximal assistance;Sitting Upper Body Dressing Details (indicate cue type and reason): Educated pt and daughter on don and doff sling, pt able to assist with gown for RUE Lower Body Dressing: Total assistance;+2 for physical assistance;+2 for safety/equipment;Bed level Lower Body Dressing Details (indicate cue type and reason): Total +2 for rolling      Extremity/Trunk Assessment Upper Extremity Assessment Upper Extremity Assessment: LUE deficits/detail LUE Deficits / Details: able to move digits and flex/ext wrist. elbow/shoulder not tested due to immobilization in sling LUE: Unable to fully assess due to immobilization LUE Coordination: decreased fine motor;decreased gross motor   Lower Extremity Assessment Lower Extremity Assessment: Defer to PT evaluation        Vision   Vision Assessment?: No apparent visual deficits         Communication Communication Communication: No apparent difficulties   Cognition Arousal: Alert Behavior During Therapy: WFL for tasks assessed/performed Cognition: No apparent impairments       Following commands: Intact  Cueing    Cueing Techniques: Verbal cues        General Comments Pt daughter present and helpful during session    Pertinent Vitals/ Pain       Pain Assessment Pain Assessment: Faces Faces Pain Scale: Hurts worst Pain Location: L shoulder/elbow Pain Descriptors / Indicators: Discomfort Pain Intervention(s): Limited activity within patient's tolerance   Frequency  Min 2X/week        Progress Toward Goals  OT Goals(current goals can now be found in the care plan section)  Progress towards OT goals: Progressing toward goals  Acute Rehab OT Goals Patient Stated Goal: To go to rehab OT Goal Formulation: With patient Time For Goal Achievement: 11/05/23 Potential to Achieve Goals: Good ADL Goals Pt Will Perform Grooming: with set-up;sitting Pt Will Perform Upper Body Dressing: with min assist;sitting Pt Will Perform Lower Body Dressing: with mod assist;sitting/lateral leans;sit to/from stand Pt Will Transfer to Toilet: with min assist;with +2 assist;squat pivot transfer;stand pivot transfer;bedside commode  Plan         AM-PAC OT "6 Clicks" Daily Activity     Outcome Measure   Help from another person eating meals?: A Little Help from another person taking care of personal grooming?: A Little Help from another person toileting, which includes using toliet, bedpan, or urinal?: Total Help from another person bathing (including washing, rinsing, drying)?: A Lot Help from another person to put on and taking off regular upper body clothing?: A Lot Help from another person to put on and taking off regular lower body clothing?: A Lot 6 Click Score: 13    End of Session Equipment Utilized During Treatment: Other (comment) (LUE sling)  OT Visit Diagnosis: Unsteadiness on feet (R26.81);Other abnormalities of gait and mobility (R26.89);Muscle weakness (generalized) (M62.81)   Activity Tolerance Patient limited by pain   Patient Left in bed;with call bell/phone within reach;with  nursing/sitter in room;with family/visitor present   Nurse Communication Mobility status        Time: 6578-4696 OT Time Calculation (min): 19 min  Charges: OT General Charges $OT Visit: 1 Visit OT Treatments $Self Care/Home Management : 8-22 mins  Delmer Ferraris, OT  Acute Rehabilitation Services Office [phone] Secure chat preferred   Mickael Alamo 10/25/2023, 1:39 PM

## 2023-10-25 NOTE — Discharge Summary (Signed)
 Physician Discharge Summary  Peggy Armstrong QMV:[id number] DOB: April 15, 1946 DOA: 10/21/2023  PCP: Cherylene Corrente, PA-C  Admit date: 10/21/2023 Discharge date: 10/25/2023 30 Day Unplanned Readmission Risk Score    Flowsheet Row ED to Hosp-Admission (Current) from 10/21/2023 in [facility]  30 Day Unplanned Readmission Risk Score (%) 36.08 Filed at 10/25/2023 0801       This score is the patient's risk of an unplanned readmission within 30 days of being discharged (0 -100%). The score is based on dignosis, age, lab data, medications, orders, and past utilization.   Low:  0-14.9   Medium: 15-21.9   High: 22-29.9   Extreme: 30 and above          Admitted From: Home Disposition: SNF  Recommendations for Outpatient Follow-up:  Follow up with PCP in 1-2 weeks Please obtain BMP/CBC in one week Follow-up with Dr. Rogers/orthopedic surgery in 5 to 7 days Please follow up with your PCP on the following pending results: Unresulted Labs (From admission, onward)     Start     Ordered   10/26/23 0500  Basic metabolic panel with GFR  Tomorrow morning,   R        10/25/23 0954   10/26/23 0500  Magnesium   Tomorrow morning,   R        10/25/23 0954   10/26/23 0500  Phosphorus  Tomorrow morning,   R        10/25/23 0954   10/24/23 1059  CBC with Differential/Platelet  5A & 5P,   R      10/24/23 1058              Home Health: None Equipment/Devices: None  Discharge Condition: Stable CODE STATUS: Full code Diet recommendation: Cardiac  Subjective: Seen and examined.  Left upper extremity pain is better controlled.  No other complaint.  She is aware and in agreement with discharging to SNF today.  Brief/Interim Summary: Peggy Armstrong is a 78 y.o. female with medical history significant HFpEF, paroxysmal atrial fibrillation on Eliquis , AAA, essential hypertension, history of recurrent GI bleed, rheumatoid arthritis, CKD stage IIIa,  depression, anxiety, hypothyroidism, DVT, hyperlipidemia.  Patient has been recently hospitalized to [facility] discharged on 10/19/2023.  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   Per chart review of [facility] general surgery note recommended to hold  Eliquis  10 days postprocedure starting from 10/19/2023.   Patient presented to emergency department 10/21/2023 for evaluation for fall.  She reported that when she stood up from the wheelchair lost balance and fell on the left side.  Reported hitting of the head.  Since the fall endorsing left-sided shoulder and knee pain.  Reported she was on the ground for almost 1 hour before family assisted her to bed.  No other symptoms.  Upon arrival, she was hemodynamically stable.  X-ray of the left shoulder revealed acute displaced and comminuted impacted left humeral head and neck fracture.  X-ray of the elbow showed possible avulsion fracture.  Rest of the imaging studies including CT scan of the head and cervical spine were unremarkable.ED physician consulted orthopedist Dr. Charol Copas. Patient was eventually admitted under hospitalist service.  Details below.   Mechanical fall at home home leading to left humeral head fracture, POA: X-ray of the shoulder showed  acute displaced communicated left femoral head and neck fracture. X-ray of the left elbow showed avulsion fracture however CT left elbow ruled out avulsion fracture. - X-ray of the left hip showed left greater trochanter questionable fracture but CT of the pelvis ruled out greater trochanter fracture. - CT head and CT cervical spine unremarkable -ED physician consulted orthopedist Dr. Charol Copas recommended sling placement, not weightbearing of the left upper extremity.  Dr. Charol Copas  recommended f/u with Dr. Hiram Lukes in 5-7 days post dc. Call [phone] to schedule.  -Left-sided shoulder sling has been placed.  Continue nonweightbearing of the left upper extremity.  Patient seen by PT OT and they recommend SNF.  TOC consulted.  Her insurance offered peer to peer which I completed today and she was approved preliminary.  TOC has been informed.  Patient will be discharged to SNF today in stable condition.   History of GI bleed and anemia of chronic disease/ New diagnosis of gastric adenocarcinoma -Patient has history of hemorrhagic shock and recurrent GI bleed.  She underwent CT abdomen pelvis 4/25 which showed duodenal lesion. Patient underwent biopsy by surgical oncology which showed poorly differentiated mucinous adenocarcinoma. -Surgical oncology has consulted while at [facility].  Pending further recommendation - Patient has been managed with IV Protonix  twice daily recommended to continue PPI twice daily on discharge.  Patient's hemoglobin remained fairly stable around 8.  Per chart review of [facility] general surgery note recommended to hold  Eliquis  10 days postprocedure starting from 10/19/2023.  And for that reason, I have indicated in the discharge med rec to resume her Eliquis  on 10/29/2023 if her hemoglobin remains stable   Hypomagnesemia: Replenished yesterday and again today.   Essential hypertension Chronic diastolic heart failure preserved EF 60 to 65% - Per chart review of discharge summary from [facility] patient supposed to be on Cardizem  240 mg daily daily along with chlorthalidone , losartan , both Lasix  and torsemide ?.  Currently blood pressure is borderline soft and heart rate around 80. - Started Cardizem  on 120 mg daily and chlorthalidone  25 mg daily.  Continuing torsemide .  If blood pressure continue to trend can resume losartan .  If develops tachycardia can increase the dose of Cardizem  to 240 mg.  However currently both blood pressure and heart rate controlled so we will  continue current management.   Paroxysmal atrial fibrillation - Continue Cardizem  120 mg daily and amiodarone  200 mg daily.  Based on the heart rate trend can increase the dose of Cardizem  to 240 mg. -History of recent GI bleed and hemorrhagic shock.  Per chart review of the [facility] oncology surgeon note recommended to hold Eliquis  10 days starting from 10/19/2023.   History of DVT - Eliquis  on hold as mentioned above.   Hyperlipidemia -Continue Lipitor   Hypothyroidism -Continue levothyroxine    Generalized anxiety disorder History of depression -Continue Cymbalta , Zoloft  and BuSpar    CKD stage III 3a -Creatinine 1.1 and GFR 51.  Renal function at baseline.   Gout -Continue allopurinol .   Reactive airway disease -Continue albuterol  as needed   Hypokalemia: Replenished again today.  Discharge plan was discussed with patient and/or family member and they verbalized understanding and agreed with it.  Discharge Diagnoses:  Principal Problem:   Fall at home, initial encounter Active Problems:   Left humeral fracture   Left elbow avulsion fracture   Duodenal adenocarcinoma (HCC)   CKD (chronic kidney disease) stage 3, GFR 30-59 ml/min (HCC)   Essential hypertension   Chronic diastolic CHF (congestive heart failure) (HCC)  History of DVT (deep vein thrombosis)   Paroxysmal atrial fibrillation (HCC)   AAA (abdominal aortic aneurysm) (HCC)   Hyperlipidemia   GAD (generalized anxiety disorder)   Hypothyroidism   History of GI bleed   History of depression    Discharge Instructions   Allergies as of 10/25/2023       Reactions   Codeine Nausea And Vomiting   Made "very sick"   Tape    Tears skin - surgical tape        Medication List     TAKE these medications    acetaminophen  325 MG tablet Commonly known as: TYLENOL  Take 650 mg by mouth every 6 (six) hours as needed for mild pain (pain score 1-3) or moderate pain (pain score 4-6).   allopurinol  300 MG  tablet Commonly known as: ZYLOPRIM  Take 1 tablet (300 mg total) by mouth daily.   amiodarone  200 MG tablet Commonly known as: PACERONE  Take 1 tablet (200 mg total) by mouth daily.   atorvastatin  20 MG tablet Commonly known as: LIPITOR Take 1 tablet (20 mg total) by mouth daily.   busPIRone  10 MG tablet Commonly known as: BUSPAR  TAKE 1 TABLET BY MOUTH TWICE A DAY   cholecalciferol  1000 units tablet Commonly known as: VITAMIN D Take 1,000 Units by mouth daily.   cyanocobalamin  1000 MCG tablet Commonly known as: VITAMIN B12 Take 1 tablet by mouth daily.   diltiazem  120 MG 24 hr capsule Commonly known as: CARDIZEM  CD Take 1 capsule (120 mg total) by mouth daily. Start taking on: Oct 26, 2023   DULoxetine  20 MG capsule Commonly known as: CYMBALTA  TAKE 1 CAPSULE BY MOUTH EVERY DAY What changed: how much to take   Eliquis  5 MG Tabs tablet Generic drug: apixaban  Take 1 tablet (5 mg total) by mouth 2 (two) times daily. Start taking on: Oct 29, 2023 What changed: These instructions start on Oct 29, 2023. If you are unsure what to do until then, ask your doctor or other care provider.   ferrous sulfate  325 (65 FE) MG tablet Take 325 mg by mouth daily with breakfast.   folic acid  1 MG tablet Commonly known as: FOLVITE  Take 1 tablet (1 mg total) by mouth daily.   Klor-Con  M20 20 MEQ tablet Generic drug: potassium chloride  SA Take 20 mEq by mouth daily.   levothyroxine  100 MCG tablet Commonly known as: SYNTHROID  Take 1 tablet (100 mcg total) by mouth daily before breakfast.   melatonin 3 MG Tabs tablet Take 1 tablet (3 mg total) by mouth at bedtime as needed (insomnia).   methocarbamol  500 MG tablet Commonly known as: ROBAXIN  TAKE 1 TABLET BY MOUTH EVERY 6 HOURS AS NEEDED FOR MUSCLE SPASMS.   ondansetron  4 MG tablet Commonly known as: ZOFRAN  Take 4 mg by mouth every 6 (six) hours as needed for nausea or vomiting.   oxyCODONE  5 MG immediate release tablet Commonly  known as: Oxy IR/ROXICODONE  Take 1 tablet (5 mg total) by mouth every 4 (four) hours as needed for severe pain (pain score 7-10). What changed: when to take this   pantoprazole  40 MG tablet Commonly known as: PROTONIX  Take 1 tablet (40 mg total) by mouth daily.   torsemide  20 MG tablet Commonly known as: DEMADEX  Take Torsemide  on alternating days40 mg one day (2 tablets) then the next day 20 mg (1 tablet).   Vitamin C CR 500 MG Tbcr Take 500 mg by mouth daily.        Follow-up Information  Rothfuss, Jacob T, PA-C Follow up in 1 week(s).   Specialty: Physician Assistant Contact information: [address] [place] [address] [phone]         Janeth Medicus, MD. Schedule an appointment as soon as possible for a visit in 5 day(s).   Specialty: Orthopedic Surgery Contact information: [address] [place] [address] [place] [address] [phone]                Allergies  Allergen Reactions   Codeine Nausea And Vomiting    Made "very sick"   Tape     Tears skin - surgical tape    Consultations: Orthopedics   Procedures/Studies: CT Elbow Left Wo Contrast Result Date: 10/22/2023 CLINICAL DATA:  Fall, possible olecranon fracture EXAM: CT OF THE UPPER LEFT EXTREMITY WITHOUT CONTRAST TECHNIQUE: Multidetector CT imaging of the upper left extremity was performed according to the standard protocol. RADIATION DOSE REDUCTION: This exam was performed according to the departmental dose-optimization program which includes automated exposure control, adjustment of the mA and/or kV according to patient size and/or use of iterative reconstruction technique. COMPARISON:  Left elbow radiographs dated 10/21/2023 FINDINGS: No fracture or dislocation is seen. Specifically, the olecranon is intact. Visualized soft tissues are unremarkable.  No elbow joint effusion. Mild subcutaneous stranding/hemorrhage in the posterior upper arm (series 4/image 1), incompletely  visualized, likely related to the patient's known shoulder fracture. IMPRESSION: No fracture or dislocation is seen. Specifically, the olecranon is intact. Electronically Signed   By: Zadie Herter M.D.   On: 10/22/2023 01:00   CT PELVIS WO CONTRAST Result Date: 10/22/2023 CLINICAL DATA:  Fall EXAM: CT PELVIS WITHOUT CONTRAST TECHNIQUE: Multidetector CT imaging of the pelvis was performed following the standard protocol without intravenous contrast. RADIATION DOSE REDUCTION: This exam was performed according to the departmental dose-optimization program which includes automated exposure control, adjustment of the mA and/or kV according to patient size and/or use of iterative reconstruction technique. COMPARISON:  Left hip radiograph dated 10/21/2023 FINDINGS: Urinary Tract: Excretory contrast in the bladder, within normal limits. Bowel:  Grossly unremarkable. Vascular/Lymphatic: Atherosclerotic calcifications of the abdominal aorta and branch vessels. No suspicious pelvic lymphadenopathy. Reproductive:  Status post hysterectomy. Right ovary is within normal limits.  No left adnexal mass. Other:  No pelvic ascites. Musculoskeletal: No fracture or dislocation is seen. Specifically, the greater trochanter of the left hip appears intact. Visualized bony pelvis is intact. IMPRESSION: No fracture or dislocation is seen. Specifically, the greater trochanter of the left hip appears intact. Electronically Signed   By: Zadie Herter M.D.   On: 10/22/2023 00:57   CT Head Wo Contrast Result Date: 10/21/2023 CLINICAL DATA:  glf EXAM: CT HEAD WITHOUT CONTRAST CT CERVICAL SPINE WITHOUT CONTRAST TECHNIQUE: Multidetector CT imaging of the head and cervical spine was performed following the standard protocol without intravenous contrast. Multiplanar CT image reconstructions of the cervical spine were also generated. RADIATION DOSE REDUCTION: This exam was performed according to the departmental dose-optimization program  which includes automated exposure control, adjustment of the mA and/or kV according to patient size and/or use of iterative reconstruction technique. COMPARISON:  CT head and max face 05/13/2023 FINDINGS: CT HEAD FINDINGS Brain: Patchy and confluent areas of decreased attenuation are noted throughout the deep and periventricular white matter of the cerebral hemispheres bilaterally, compatible with chronic microvascular ischemic disease. No evidence of large-territorial acute infarction. No parenchymal hemorrhage. No mass lesion. No extra-axial collection. No mass effect or midline shift. No hydrocephalus. Basilar cisterns are patent. Partially  empty sella. Vascular: No hyperdense vessel. Atherosclerotic calcifications are present within the cavernous internal carotid and vertebral arteries. Skull: No acute fracture or focal lesion. Sinuses/Orbits: Bilateral ethmoid mucosal thickening. Otherwise paranasal sinuses and mastoid air cells are clear. The orbits are unremarkable. Other: None. CT CERVICAL SPINE FINDINGS Alignment: Normal. Skull base and vertebrae: No acute fracture. No aggressive appearing focal osseous lesion or focal pathologic process. Soft tissues and spinal canal: No prevertebral fluid or swelling. No visible canal hematoma. Upper chest: Unremarkable. Other: Atherosclerotic plaque of the aortic arch and its branches. Slightly heterogeneous nonenlarged thyroid  glands with associated calcifications-no further follow-up indicated. IMPRESSION: 1. No acute intracranial abnormality. 2. No acute displaced fracture or traumatic listhesis of the cervical spine. 3. Partial empty sella. Findings is often a normal anatomic variant but can be associated with idiopathic intracranial hypertension (pseudotumor cerebri). 4.  Aortic Atherosclerosis (ICD10-I70.0). Electronically Signed   By: Morgane  Naveau M.D.   On: 10/21/2023 22:12   CT Cervical Spine Wo Contrast Result Date: 10/21/2023 CLINICAL DATA:  glf EXAM: CT  HEAD WITHOUT CONTRAST CT CERVICAL SPINE WITHOUT CONTRAST TECHNIQUE: Multidetector CT imaging of the head and cervical spine was performed following the standard protocol without intravenous contrast. Multiplanar CT image reconstructions of the cervical spine were also generated. RADIATION DOSE REDUCTION: This exam was performed according to the departmental dose-optimization program which includes automated exposure control, adjustment of the mA and/or kV according to patient size and/or use of iterative reconstruction technique. COMPARISON:  CT head and max face 05/13/2023 FINDINGS: CT HEAD FINDINGS Brain: Patchy and confluent areas of decreased attenuation are noted throughout the deep and periventricular white matter of the cerebral hemispheres bilaterally, compatible with chronic microvascular ischemic disease. No evidence of large-territorial acute infarction. No parenchymal hemorrhage. No mass lesion. No extra-axial collection. No mass effect or midline shift. No hydrocephalus. Basilar cisterns are patent. Partially empty sella. Vascular: No hyperdense vessel. Atherosclerotic calcifications are present within the cavernous internal carotid and vertebral arteries. Skull: No acute fracture or focal lesion. Sinuses/Orbits: Bilateral ethmoid mucosal thickening. Otherwise paranasal sinuses and mastoid air cells are clear. The orbits are unremarkable. Other: None. CT CERVICAL SPINE FINDINGS Alignment: Normal. Skull base and vertebrae: No acute fracture. No aggressive appearing focal osseous lesion or focal pathologic process. Soft tissues and spinal canal: No prevertebral fluid or swelling. No visible canal hematoma. Upper chest: Unremarkable. Other: Atherosclerotic plaque of the aortic arch and its branches. Slightly heterogeneous nonenlarged thyroid  glands with associated calcifications-no further follow-up indicated. IMPRESSION: 1. No acute intracranial abnormality. 2. No acute displaced fracture or traumatic  listhesis of the cervical spine. 3. Partial empty sella. Findings is often a normal anatomic variant but can be associated with idiopathic intracranial hypertension (pseudotumor cerebri). 4.  Aortic Atherosclerosis (ICD10-I70.0). Electronically Signed   By: Morgane  Naveau M.D.   On: 10/21/2023 22:12   DG Knee 2 Views Left Result Date: 10/21/2023 CLINICAL DATA:  GLF onto shoulder EXAM: LEFT KNEE - 1-2 VIEW COMPARISON:  X-ray left knee 05/11/2023 FINDINGS: No evidence of fracture, dislocation, or joint effusion. No evidence of arthropathy or other focal bone abnormality. Soft tissues are unremarkable. Vascular calcification. IMPRESSION: No acute displaced fracture or dislocation. Electronically Signed   By: Morgane  Naveau M.D.   On: 10/21/2023 21:54   DG Hip Unilat W or Wo Pelvis 2-3 Views Left Result Date: 10/21/2023 CLINICAL DATA:  GLF onto shoulder EXAM: DG HIP (WITH OR WITHOUT PELVIS) 2-3V LEFT COMPARISON:  CT abdomen pelvis 07/20/2023 FINDINGS: Limited evaluation due to  overlapping osseous structures and overlying soft tissues. Cortical irregularity along the left greater trochanter with acute nondisplaced fracture not excluded. No left hip dislocation. No acute displaced fracture or dislocation of the right hip on frontal view. No acute displaced fracture or diastasis of the bones of the pelvis. There is no evidence of hip fracture or dislocation. There is no evidence of arthropathy or other focal bone abnormality. Left hip subcutaneus soft tissue edema. Vascular calcification. IMPRESSION: Cortical irregularity along the left greater trochanter with acute nondisplaced fracture not excluded. Correlate with point tenderness to palpation. Electronically Signed   By: Morgane  Naveau M.D.   On: 10/21/2023 21:53   DG Elbow 2 Views Left Result Date: 10/21/2023 CLINICAL DATA:  GLF onto shoulder EXAM: LEFT ELBOW - 2 VIEW COMPARISON:  None Available. FINDINGS: Slightly limited evaluation due to nonstandard  lateral view. There is no evidence of definite acute displaced fracture or dislocation. Vague cortical irregularity along the olecranon coronoid process. Limited evaluation for joint effusion there is no evidence of arthropathy or other focal bone abnormality. Soft tissues are unremarkable. IMPRESSION: Slightly limited evaluation due to nonstandard lateral view. Vague cortical irregularity along the olecranon coronoid process. Finding could represent an avulsion fracture. Electronically Signed   By: Morgane  Naveau M.D.   On: 10/21/2023 21:50   DG Humerus Left Result Date: 10/21/2023 CLINICAL DATA:  GLF onto shoulder EXAM: LEFT HUMERUS - 2+ VIEW COMPARISON:  X-ray left shoulder 10/21/2023. FINDINGS: Redemonstration of proximal left humeral fracture better evaluated on x-ray left shoulder 10/21/2023. There is no evidence of other humeral fracture or other focal bone lesions. Elbow is grossly unremarkable. Soft tissues are unremarkable. IMPRESSION: 1. Redemonstration of proximal left humeral fracture better evaluated on x-ray left shoulder 10/21/2023. 2. No other acute humeral fracture. Electronically Signed   By: Morgane  Naveau M.D.   On: 10/21/2023 21:48   DG Shoulder Left Result Date: 10/21/2023 CLINICAL DATA:  GLF onto shoulder EXAM: LEFT SHOULDER - 2+ VIEW COMPARISON:  CT chest 03/29/2017 FINDINGS: No dislocation. Acute displaced and comminuted impacted left humeral head and neck fracture. Mild acromioclavicular joint and at least mild glenohumeral joint degenerative changes. No aggressive appearing osseous lesion. Subcutaneus soft tissue edema IMPRESSION: Acute displaced and comminuted impacted left humeral head and neck fracture. Electronically Signed   By: Morgane  Naveau M.D.   On: 10/21/2023 21:48     Discharge Exam: Vitals:   10/25/23 0424 10/25/23 0812  BP: (!) 106/56 (!) 113/58  Pulse: 66 67  Resp: 16 18  Temp: 97.8 F (36.6 C) 98.3 F (36.8 C)  SpO2: 93% 95%   Vitals:   10/24/23  1450 10/24/23 2034 10/25/23 0424 10/25/23 0812  BP: (!) 100/58 122/60 (!) 106/56 (!) 113/58  Pulse: 79 76 66 67  Resp: 18 16 16 18   Temp: 98 F (36.7 C) 99 F (37.2 C) 97.8 F (36.6 C) 98.3 F (36.8 C)  TempSrc:      SpO2: 98% 95% 93% 95%  Weight:      Height:        General: Pt is alert, awake, not in acute distress Cardiovascular: RRR, S1/S2 +, no rubs, no gallops Respiratory: CTA bilaterally, no wheezing, no rhonchi Abdominal: Soft, NT, ND, bowel sounds + Extremities: no edema, no cyanosis, sling in the left upper extremity, nonweightbearing    The results of significant diagnostics from this hospitalization (including imaging, microbiology, ancillary and laboratory) are listed below for reference.     Microbiology: Recent Results (from the past  240 hours)  Surgical PCR screen     Status: Abnormal   Collection Time: 10/22/23  1:46 AM   Specimen: Nasal Mucosa; Nasal Swab  Result Value Ref Range Status   MRSA, PCR NEGATIVE NEGATIVE Final   Staphylococcus aureus POSITIVE (A) NEGATIVE Final    Comment: (NOTE) The Xpert SA Assay (FDA approved for NASAL specimens in patients 9 years of age and older), is one component of a comprehensive surveillance program. It is not intended to diagnose infection nor to guide or monitor treatment. Performed at [facility], [address] [place], [place] [address]      Labs: BNP (last 3 results) No results for input(s): "BNP" in the last 8760 hours. Basic Metabolic Panel: Recent Labs  Lab 10/21/23 2029 10/22/23 0510 10/23/23 0647 10/24/23 0636 10/25/23 0629 10/25/23 0746  NA 140 136 133* 132* 133*  --   K 3.6 3.3* 3.3* 3.5 2.9*  --   CL 102 100 97* 99 95*  --   CO2 34* 26 25 25 26   --   GLUCOSE 108* 108* 97 94 92  --   BUN 17 14 15 23  28*  --   CREATININE 1.11* 0.94 1.07* 1.26* 1.31*  --   CALCIUM  8.6* 8.2* 8.1* 7.7* 8.0*  --   MG  --   --   --  1.3*  --  1.5*   Liver Function Tests: Recent Labs  Lab  10/22/23 0510  AST 49*  ALT 41  ALKPHOS 126  BILITOT 0.7  PROT 5.4*  ALBUMIN 2.6*   No results for input(s): "LIPASE", "AMYLASE" in the last 168 hours. No results for input(s): "AMMONIA" in the last 168 hours. CBC: Recent Labs  Lab 10/23/23 0647 10/24/23 0636 10/24/23 0858 10/24/23 1113 10/24/23 1841 10/25/23 0629  WBC 10.2 7.4  --  7.5 8.8 6.5  NEUTROABS 8.3* 5.8  --  6.1 6.9 5.1  HGB 8.1* 7.5* 7.9* 7.7* 8.0* 8.1*  HCT 26.1* 24.1* 25.9* 24.5* 25.8* 25.9*  MCV 93.9 92.7  --  93.5 94.5 93.8  PLT 77* 87*  --  93* 98* 118*   Cardiac Enzymes: Recent Labs  Lab 10/21/23 2029  CKTOTAL 32*   BNP: Invalid input(s): "POCBNP" CBG: Recent Labs  Lab 10/22/23 2115  GLUCAP 105*   D-Dimer No results for input(s): "DDIMER" in the last 72 hours. Hgb A1c No results for input(s): "HGBA1C" in the last 72 hours. Lipid Profile No results for input(s): "CHOL", "HDL", "LDLCALC", "TRIG", "CHOLHDL", "LDLDIRECT" in the last 72 hours. Thyroid  function studies No results for input(s): "TSH", "T4TOTAL", "T3FREE", "THYROIDAB" in the last 72 hours.  Invalid input(s): "FREET3" Anemia work up No results for input(s): "VITAMINB12", "FOLATE", "FERRITIN", "TIBC", "IRON", "RETICCTPCT" in the last 72 hours. Urinalysis    Component Value Date/Time   COLORURINE YELLOW 05/11/2023 1721   APPEARANCEUR CLOUDY (A) 05/11/2023 1721   LABSPEC 1.012 05/11/2023 1721   PHURINE 7.0 05/11/2023 1721   GLUCOSEU NEGATIVE 05/11/2023 1721   HGBUR LARGE (A) 05/11/2023 1721   BILIRUBINUR NEGATIVE 05/11/2023 1721   KETONESUR NEGATIVE 05/11/2023 1721   PROTEINUR NEGATIVE 05/11/2023 1721   NITRITE POSITIVE (A) 05/11/2023 1721   LEUKOCYTESUR MODERATE (A) 05/11/2023 1721   Sepsis Labs Recent Labs  Lab 10/24/23 0636 10/24/23 1113 10/24/23 1841 10/25/23 0629  WBC 7.4 7.5 8.8 6.5   Microbiology Recent Results (from the past 240 hours)  Surgical PCR screen     Status: Abnormal   Collection Time: 10/22/23  1:46 AM   Specimen: Nasal Mucosa; Nasal Swab  Result Value Ref Range Status   MRSA, PCR NEGATIVE NEGATIVE Final   Staphylococcus aureus POSITIVE (A) NEGATIVE Final    Comment: (NOTE) The Xpert SA Assay (FDA approved for NASAL specimens in patients 14 years of age and older), is one component of a comprehensive surveillance program. It is not intended to diagnose infection nor to guide or monitor treatment. Performed at [facility], [address] [place], [place] [address]     FURTHER DISCHARGE INSTRUCTIONS:   Get Medicines reviewed and adjusted: Please take all your medications with you for your next visit with your Primary MD   Laboratory/radiological data: Please request your Primary MD to go over all hospital tests and procedure/radiological results at the follow up, please ask your Primary MD to get all Hospital records sent to his/her office.   In some cases, they will be blood work, cultures and biopsy results pending at the time of your discharge. Please request that your primary care M.D. goes through all the records of your hospital data and follows up on these results.   Also Note the following: If you experience worsening of your admission symptoms, develop shortness of breath, life threatening emergency, suicidal or homicidal thoughts you must seek medical attention immediately by calling 911 or calling your MD immediately  if symptoms less severe.   You must read complete instructions/literature along with all the possible adverse reactions/side effects for all the Medicines you take and that have been prescribed to you. Take any new Medicines after you have completely understood and accpet all the possible adverse reactions/side effects.    Do not drive when taking Pain medications or sleeping medications (Benzodaizepines)   Do not take more than prescribed Pain, Sleep and Anxiety Medications. It is not advisable to combine anxiety,sleep and pain medications  without talking with your primary care practitioner   Special Instructions: If you have smoked or chewed Tobacco  in the last 2 yrs please stop smoking, stop any regular Alcohol  and or any Recreational drug use.   Wear Seat belts while driving.   Please note: You were cared for by a hospitalist during your hospital stay. Once you are discharged, your primary care physician will handle any further medical issues. Please note that NO REFILLS for any discharge medications will be authorized once you are discharged, as it is imperative that you return to your primary care physician (or establish a relationship with a primary care physician if you do not have one) for your post hospital discharge needs so that they can reassess your need for medications and monitor your lab values  Time coordinating discharge: Over 30 minutes  SIGNED:   Modena Andes, MD  [facility] 10/25/2023, 11:04 AM *Please note that this is a verbal dictation therefore any spelling or grammatical errors are due to the "Dragon Medical One" system interpretation. If 7PM-7AM, please contact night-coverage [email or web address]

## 2023-10-25 NOTE — TOC Progression Note (Addendum)
 Transition of Care [facility]) - Progression Note    Patient Details  Name: Peggy Armstrong MRN: [id number] Date of Birth: 05/12/1946  [facility] CM/SW Contact  Elspeth Hals, LCSW Phone Number: 10/25/2023, 8:25 AM  Clinical Narrative:   SNF auth request remains pending.   1040: [facility] [employer]: offering P2P for SNF auth by 3pm today.  [phone], option 5.  MD informed.   1145: SNF auth approved: [id number], 3 days: 4/29-5/1.  CSW confirmed with [facility] that they can receive pt today.  Expected Discharge Plan: Skilled Nursing Facility Barriers to Discharge: SNF Pending bed offer  Expected Discharge Plan and Services In-house Referral: Clinical Social Work   Post Acute Care Choice: Skilled Nursing Facility Living arrangements for the past 2 months: Single Family Home                                       Social Determinants of Health (SDOH) Interventions SDOH Screenings   Food Insecurity: No Food Insecurity (10/22/2023)  Housing: Low Risk  (10/22/2023)  Transportation Needs: No Transportation Needs (10/22/2023)  Utilities: Not At Risk (10/22/2023)  Depression (PHQ2-9): Low Risk  (07/31/2023)  Financial Resource Strain: Low Risk  (12/02/2022)   Received from [facility], [facility]  Physical Activity: Unknown (04/05/2022)   Received from [facility], [facility]  Social Connections: Moderately Isolated (10/22/2023)  Stress: No Stress Concern Present (04/05/2022)   Received from [facility], [facility]  Tobacco Use: Medium Risk (10/21/2023)    Readmission Risk Interventions    07/24/2023    5:23 PM 05/14/2023    6:44 PM  Readmission Risk Prevention Plan  Transportation Screening Complete Complete  PCP or Specialist Appt within 3-5 Days Complete Complete  HRI or Home Care Consult Complete Complete  Social Work Consult for Recovery Care Planning/Counseling Complete Complete  Palliative Care Screening Not Applicable Complete   Medication Review Oceanographer) Referral to Pharmacy Referral to Pharmacy

## 2023-10-25 NOTE — TOC Transition Note (Signed)
 Transition of Care [facility]) - Discharge Note   Patient Details  Name: Peggy Armstrong MRN: [id number] Date of Birth: [birth year]  Transition of Care [facility]) CM/SW Contact:  Elspeth Hals, LCSW Phone Number: 10/25/2023, 11:58 AM   Clinical Narrative:  Pt discharging to [facility], room 207.  RN call report to [phone].  PTAR called [phone].     Final next level of care: Skilled Nursing Facility Barriers to Discharge: Barriers Resolved   Patient Goals and CMS Choice     Choice offered to / list presented to : Patient, Adult Children (pt requesting [place] [facility])      Discharge Placement              Patient chooses bed at:  [facility] rehab) Patient to be transferred to facility by: PTAR Name of family member notified: daughter [name] in room Patient and family notified of of transfer: 10/25/23  Discharge Plan and Services Additional resources added to the After Visit Summary for   In-house Referral: Clinical Social Work   Post Acute Care Choice: Skilled Nursing Facility                               Social Drivers of Health (SDOH) Interventions SDOH Screenings   Food Insecurity: No Food Insecurity (10/22/2023)  Housing: Low Risk  (10/22/2023)  Transportation Needs: No Transportation Needs (10/22/2023)  Utilities: Not At Risk (10/22/2023)  Depression (PHQ2-9): Low Risk  (07/31/2023)  Financial Resource Strain: Low Risk  (12/02/2022)   Received from [facility], [facility]  Physical Activity: Unknown (04/05/2022)   Received from [facility], [facility]  Social Connections: Moderately Isolated (10/22/2023)  Stress: No Stress Concern Present (04/05/2022)   Received from [facility], [facility]  Tobacco Use: Medium Risk (10/21/2023)     Readmission Risk Interventions    07/24/2023    5:23 PM 05/14/2023    6:44 PM  Readmission Risk Prevention Plan  Transportation Screening Complete Complete  PCP or Specialist Appt within 3-5 Days  Complete Complete  HRI or Home Care Consult Complete Complete  Social Work Consult for Recovery Care Planning/Counseling Complete Complete  Palliative Care Screening Not Applicable Complete  Medication Review Oceanographer) Referral to Pharmacy Referral to Pharmacy

## 2023-10-27 DIAGNOSIS — C170 Malignant neoplasm of duodenum: Secondary | ICD-10-CM | POA: Diagnosis not present

## 2023-10-27 DIAGNOSIS — I482 Chronic atrial fibrillation, unspecified: Secondary | ICD-10-CM | POA: Diagnosis not present

## 2023-10-27 DIAGNOSIS — S42212D Unspecified displaced fracture of surgical neck of left humerus, subsequent encounter for fracture with routine healing: Secondary | ICD-10-CM | POA: Diagnosis not present

## 2023-10-27 DIAGNOSIS — S42432D Displaced fracture (avulsion) of lateral epicondyle of left humerus, subsequent encounter for fracture with routine healing: Secondary | ICD-10-CM | POA: Diagnosis not present

## 2023-10-27 NOTE — Progress Notes (Signed)
 [facility] Liaison Note  10/27/2023  CYSTAL GAVAN 1946/03/19 [id number]  Location: RN Hospital Liaison screened the patient remotely at [facility].  Insurance: [employer]   DEMEISHA BOTTARI is a 78 y.o. female who is a Primary Care Patient of [facility], Alexia Angelucci [facility] .  The patient was screened for readmission hospitalization with noted extreme risk score for unplanned readmission risk with 1 IP in 6 months.  The patient was assessed for potential Care Management service needs for post hospital transition for care coordination. Review of patient's electronic medical record reveals patient was admitted for closed fracture. Pt recommended for SNF level of care for ongoing rehab. Pt transitioned to [facility]. This facility will continue to address pt's ongoing needs.   [facility] Care Management/Population Health does not replace or interfere with any arrangements made by the Inpatient Transition of Care team.   For questions contact:   Lilla Reichert, RN, BSN Hospital Liaison [place]   [facility], Population Health Office Hours MTWF  8:00 am-6:00 pm Direct Dial: [phone] mobile [email or web address]

## 2023-10-30 ENCOUNTER — Other Ambulatory Visit (HOSPITAL_BASED_OUTPATIENT_CLINIC_OR_DEPARTMENT_OTHER): Payer: Self-pay | Admitting: Student

## 2023-10-30 DIAGNOSIS — I509 Heart failure, unspecified: Secondary | ICD-10-CM | POA: Diagnosis not present

## 2023-10-30 DIAGNOSIS — I10 Essential (primary) hypertension: Secondary | ICD-10-CM | POA: Diagnosis not present

## 2023-10-30 DIAGNOSIS — C170 Malignant neoplasm of duodenum: Secondary | ICD-10-CM

## 2023-10-30 DIAGNOSIS — Z043 Encounter for examination and observation following other accident: Secondary | ICD-10-CM | POA: Diagnosis not present

## 2023-10-30 DIAGNOSIS — I4891 Unspecified atrial fibrillation: Secondary | ICD-10-CM | POA: Diagnosis not present

## 2023-11-01 ENCOUNTER — Other Ambulatory Visit (HOSPITAL_BASED_OUTPATIENT_CLINIC_OR_DEPARTMENT_OTHER): Payer: Self-pay | Admitting: Student

## 2023-11-01 DIAGNOSIS — R300 Dysuria: Secondary | ICD-10-CM | POA: Diagnosis not present

## 2023-11-01 DIAGNOSIS — C170 Malignant neoplasm of duodenum: Secondary | ICD-10-CM

## 2023-11-02 NOTE — Telephone Encounter (Signed)
 Copied from CRM [id number]. Topic: Referral - Status >> Nov 02, 2023  3:51 PM Antwanette L wrote: Reason for CRM: Pam from Dr. 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 Dr. Britta Candy said the patient can go to [facility]([address]) and see Dr. Cherlynn Cornfield or Dr.Alan. The nurse line phone number is [phone].

## 2023-11-03 DIAGNOSIS — H6690 Otitis media, unspecified, unspecified ear: Secondary | ICD-10-CM | POA: Diagnosis not present

## 2023-11-04 ENCOUNTER — Other Ambulatory Visit (HOSPITAL_BASED_OUTPATIENT_CLINIC_OR_DEPARTMENT_OTHER): Payer: Self-pay | Admitting: Student

## 2023-11-04 DIAGNOSIS — C170 Malignant neoplasm of duodenum: Secondary | ICD-10-CM

## 2023-11-04 DIAGNOSIS — S32021D Stable burst fracture of second lumbar vertebra, subsequent encounter for fracture with routine healing: Secondary | ICD-10-CM | POA: Diagnosis not present

## 2023-11-04 NOTE — Progress Notes (Signed)
 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

## 2023-11-06 ENCOUNTER — Telehealth (HOSPITAL_BASED_OUTPATIENT_CLINIC_OR_DEPARTMENT_OTHER): Payer: Self-pay

## 2023-11-06 ENCOUNTER — Ambulatory Visit: Admitting: Gastroenterology

## 2023-11-06 ENCOUNTER — Telehealth: Payer: Self-pay | Admitting: Oncology

## 2023-11-06 DIAGNOSIS — I10 Essential (primary) hypertension: Secondary | ICD-10-CM | POA: Diagnosis not present

## 2023-11-06 DIAGNOSIS — H6690 Otitis media, unspecified, unspecified ear: Secondary | ICD-10-CM | POA: Diagnosis not present

## 2023-11-06 DIAGNOSIS — N390 Urinary tract infection, site not specified: Secondary | ICD-10-CM | POA: Diagnosis not present

## 2023-11-06 DIAGNOSIS — R52 Pain, unspecified: Secondary | ICD-10-CM | POA: Diagnosis not present

## 2023-11-06 NOTE — Telephone Encounter (Signed)
 11/06/23 Spoke with Caroline(Rehab facility) and scheduled new patient referral

## 2023-11-06 NOTE — Telephone Encounter (Signed)
 Called Dr. India Mandes office to cancel referral. It was an error.

## 2023-11-06 NOTE — Telephone Encounter (Signed)
 Spoke to [place], pt's daughter, she does not know if pt needed anything. She will call us  if pt needed anything.

## 2023-11-08 DIAGNOSIS — R52 Pain, unspecified: Secondary | ICD-10-CM | POA: Diagnosis not present

## 2023-11-08 DIAGNOSIS — R799 Abnormal finding of blood chemistry, unspecified: Secondary | ICD-10-CM | POA: Diagnosis not present

## 2023-11-13 DIAGNOSIS — L299 Pruritus, unspecified: Secondary | ICD-10-CM | POA: Diagnosis not present

## 2023-11-13 DIAGNOSIS — Z741 Need for assistance with personal care: Secondary | ICD-10-CM | POA: Diagnosis not present

## 2023-11-13 DIAGNOSIS — Z86718 Personal history of other venous thrombosis and embolism: Secondary | ICD-10-CM | POA: Diagnosis not present

## 2023-11-13 DIAGNOSIS — R52 Pain, unspecified: Secondary | ICD-10-CM | POA: Diagnosis not present

## 2023-11-15 ENCOUNTER — Other Ambulatory Visit: Payer: Self-pay

## 2023-11-15 DIAGNOSIS — E039 Hypothyroidism, unspecified: Secondary | ICD-10-CM | POA: Insufficient documentation

## 2023-11-16 ENCOUNTER — Other Ambulatory Visit: Payer: Self-pay | Admitting: Oncology

## 2023-11-16 DIAGNOSIS — M25512 Pain in left shoulder: Secondary | ICD-10-CM | POA: Diagnosis not present

## 2023-11-16 DIAGNOSIS — M5459 Other low back pain: Secondary | ICD-10-CM | POA: Diagnosis not present

## 2023-11-16 DIAGNOSIS — C170 Malignant neoplasm of duodenum: Secondary | ICD-10-CM

## 2023-11-16 NOTE — Progress Notes (Unsigned)
 [facility]  [address] [place],  [place]  [address] [phone]  Clinic Day:  11/22/2023  Referring physician: Cherylene Corrente, PA-C   HISTORY OF PRESENT ILLNESS:  The patient is a 78 y.o. female who I was asked to consult upon for newly diagnosed duodenal adenocarcinoma.  Her history dates back to 2018 when an endoscopic resecion of a polyp at the Ampulla of Vater showed extensive high grade dysplasia with some intramucosal adenocarcinoma.  There were also focal areas highly suspicious for submucosal invasion.  In 2019, a duodenal polypectomy was performed.  No high-grade dysplasia was seen at that time.  She presented at that time with abdominal pain. She had recurrent abdominal pain in January 2025, which led to her being hospitalized at [facility]  While there, an EGD was done, which showed a polyp in her duodenum.  Pathology from this specimen showed multifocal high-grade dysplasia.  No invasive carcinoma was identified.  However, only 50-60% of this polyp was able to be removed due to bleeding problems.  During that hospitalization, the patient claims she needed 8 units of blood due to the bleeding emanating from this lesion.  Once again, in April 2025, the patient began having abdominal problems.  Repeat studies at that time showed an atypical mass within her duodenum.  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  As what was the case months prior, this lesion could not be completely removed.  Based upon that, she comes in today with her family to discuss what can be done to address her disease.  The patient denies having any recent rectal bleeding.  However, she does complain of feeling very weak.  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   Cellulitis of left arm 08/14/2023   Cholangitis    Choledocholithiasis 03/22/2017   Chronic diastolic CHF (congestive heart failure) (HCC) 11/08/2019   Chronic low back pain 07/18/2023   CKD (chronic kidney disease) stage 3, GFR 30-59 ml/min (HCC) 06/09/2017   CKD (chronic kidney disease), stage III (HCC) 06/09/2017   Closed compression fracture of L2 lumbar vertebra, initial encounter (HCC) 05/11/2023   Clotting disorder (HCC)    right DVT     Dehydration 05/12/2023   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   Duodenal mass    Duodenal ulcer    DVT (deep venous thrombosis) (HCC)    right DVT   Epigastric pain    Essential hypertension    Fall at home, initial encounter 10/21/2023   GAD (generalized anxiety disorder)    Gastric polyps 05/16/2023   Generalized weakness 05/12/2023   GI bleed 07/20/2023   Gout    Gout    Hematemesis 11/05/2017   Hemorrhagic shock (HCC) 07/21/2023   History of depression 10/21/2023   History of DVT (deep vein thrombosis) 11/08/2019   History of GI bleed 10/21/2023   Hyperlipidemia 05/12/2023   Hypertension    Hypokalemia 07/03/2017   Hypothyroidism 05/12/2023   Iron deficiency anemia 08/02/2016  Left elbow avulsion fracture 10/21/2023   Left humeral fracture 10/21/2023   Long term current use of anticoagulant therapy 06/20/2023   Macrocytic anemia 11/05/2017   Melena 11/05/2017   Morbid obesity (HCC) 11/08/2019   Nausea & vomiting    Need for immunization against influenza 07/18/2019   Need for vaccination 04/23/2018   Occult blood in stools 05/16/2023   Paroxysmal atrial fibrillation (HCC) 05/12/2023   Peri-ampullary neoplasm    Poor dentition  08/14/2016   RA (rheumatoid arthritis) (HCC)    Restless leg 12/02/2022   Right shoulder pain 07/26/2018   Routine general medical examination at a health care facility 12/13/2019   S/P ERCP 05/05/2017   SIRS (systemic inflammatory response syndrome) (HCC) 03/22/2017   Upper GI bleed 06/20/2023   PAST SURGICAL HISTORY:   Past Surgical History:  Procedure Laterality Date   ABDOMINAL HYSTERECTOMY     BIOPSY  05/16/2023   Procedure: BIOPSY;  Surgeon: Asencion Blacksmith, MD;  Location: [facility];  Service: Gastroenterology;;   CHOLECYSTECTOMY     ENDOSCOPIC RETROGRADE CHOLANGIOPANCREATOGRAPHY (ERCP) WITH PROPOFOL  N/A 06/15/2017   Procedure: ENDOSCOPIC RETROGRADE CHOLANGIOPANCREATOGRAPHY (ERCP) WITH PROPOFOL ;  Surgeon: Janel Medford, MD;  Location: [facility];  Service: Endoscopy;  Laterality: N/A;   ESOPHAGOGASTRODUODENOSCOPY (EGD) WITH PROPOFOL  N/A 03/24/2017   Procedure: ESOPHAGOGASTRODUODENOSCOPY (EGD) WITH PROPOFOL ;  Surgeon: Albertina Hugger, MD;  Location: [facility];  Service: Gastroenterology;  Laterality: N/A;   ESOPHAGOGASTRODUODENOSCOPY (EGD) WITH PROPOFOL  N/A 11/07/2017   Procedure: ESOPHAGOGASTRODUODENOSCOPY (EGD) WITH PROPOFOL ;  Surgeon: Janel Medford, MD;  Location: [facility];  Service: Endoscopy;  Laterality: N/A;   ESOPHAGOGASTRODUODENOSCOPY (EGD) WITH PROPOFOL  N/A 05/16/2023   Procedure: ESOPHAGOGASTRODUODENOSCOPY (EGD) WITH PROPOFOL ;  Surgeon: Asencion Blacksmith, MD;  Location: [facility];  Service: Gastroenterology;  Laterality: N/A;   ESOPHAGOGASTRODUODENOSCOPY (EGD) WITH PROPOFOL  N/A 07/21/2023   Procedure: ESOPHAGOGASTRODUODENOSCOPY (EGD) WITH PROPOFOL ;  Surgeon: Lajuan Pila, MD;  Location: [facility] END[facility]COPY;  Service: Gastroenterology;  Laterality: N/A;   EUS N/A 04/06/2017   Procedure: UPPER ENDOSCOPIC ULTRASOUND (EUS) LINEAR;  Surgeon: Janel Medford, MD;  Location: [facility];  Service: Endoscopy;  Laterality: N/A;  to evaluate duodenal mass    HEMOSTASIS CLIP PLACEMENT  07/21/2023   Procedure: HEMOSTASIS CLIP PLACEMENT;  Surgeon: Lajuan Pila, MD;  Location: [facility] ENDOSCOPY;  Service: Gastroenterology;;   HEMOSTASIS CONTROL  07/21/2023   Procedure: HEMOSTASIS CONTROL;  Surgeon: Lajuan Pila, MD;  Location: [facility] ENDOSCOPY;  Service: Gastroenterology;;   HERNIA REPAIR     IR ANGIOGRAM VISCERAL SELECTIVE  07/22/2023   IR BILIARY DRAIN PLACEMENT WITH CHOLANGIOGRAM  03/25/2017   IR CONVERT BILIARY DRAIN TO INT EXT BILIARY DRAIN  04/03/2017   IR EMBO ART  VEN HEMORR LYMPH EXTRAV  INC GUIDE ROADMAPPING  07/22/2023   IR US  GUIDE VASC ACCESS RIGHT  07/22/2023   POLYPECTOMY  05/16/2023   Procedure: POLYPECTOMY;  Surgeon: Asencion Blacksmith, MD;  Location: [facility];  Service: Gastroenterology;;   CURRENT MEDICATIONS:   Current Outpatient Medications  Medication Sig Dispense Refill   sertraline  (ZOLOFT ) 25 MG tablet Take 25 mg by mouth daily.     acetaminophen  (TYLENOL ) 325 MG tablet Take 650 mg by mouth every 6 (six) hours as needed for mild pain (pain score 1-3) or moderate pain (pain score 4-6).     allopurinol  (ZYLOPRIM ) 300 MG tablet Take 1 tablet (300 mg total) by mouth daily. 90 tablet 3   amiodarone  (PACERONE ) 200 MG tablet Take 1 tablet (200 mg total) by mouth  daily. 90 tablet 3   Ascorbic Acid (VITAMIN C CR) 500 MG TBCR Take 500 mg by mouth daily.     atorvastatin  (LIPITOR) 20 MG tablet Take 1 tablet (20 mg total) by mouth daily. 60 tablet 0   busPIRone  (BUSPAR ) 10 MG tablet TAKE 1 TABLET BY MOUTH TWICE A DAY 180 tablet 2   cholecalciferol  (VITAMIN D) 1000 units tablet Take 1,000 Units by mouth daily.     cyanocobalamin  (VITAMIN B12) 1000 MCG tablet Take 1 tablet by mouth daily.     diltiazem  (CARDIZEM  CD) 120 MG 24 hr capsule Take 1 capsule (120 mg total) by mouth daily. 30 capsule 0   DULoxetine  (CYMBALTA ) 20 MG capsule TAKE 1 CAPSULE BY MOUTH EVERY DAY 90 capsule 2   ELIQUIS  5 MG TABS tablet Take 1 tablet (5 mg total) by mouth 2 (two)  times daily.     ferrous sulfate  325 (65 FE) MG tablet Take 325 mg by mouth daily with breakfast.     folic acid  (FOLVITE ) 1 MG tablet Take 1 tablet (1 mg total) by mouth daily. (Patient not taking: Reported on 10/22/2023)     KLOR-CON  M20 20 MEQ tablet Take 20 mEq by mouth daily.     levothyroxine  (SYNTHROID ) 100 MCG tablet Take 1 tablet (100 mcg total) by mouth daily before breakfast. 30 tablet 5   melatonin 3 MG TABS tablet Take 1 tablet (3 mg total) by mouth at bedtime as needed (insomnia).     methocarbamol  (ROBAXIN ) 500 MG tablet TAKE 1 TABLET BY MOUTH EVERY 6 HOURS AS NEEDED FOR MUSCLE SPASMS. 30 tablet 2   ondansetron  (ZOFRAN ) 4 MG tablet Take 4 mg by mouth every 6 (six) hours as needed for nausea or vomiting.     oxyCODONE  (OXY IR/ROXICODONE ) 5 MG immediate release tablet Take 1 tablet (5 mg total) by mouth every 4 (four) hours as needed for severe pain (pain score 7-10). 30 tablet 0   pantoprazole  (PROTONIX ) 40 MG tablet Take 1 tablet (40 mg total) by mouth daily. 30 tablet 5   torsemide  (DEMADEX ) 20 MG tablet Take Torsemide  on alternating days40 mg one day (2 tablets) then the next day 20 mg (1 tablet). 180 tablet 3   No current facility-administered medications for this visit.   Facility-Administered Medications Ordered in Other Visits  Medication Dose Route Frequency Provider Last Rate Last Admin   0.9 %  sodium chloride  infusion (Manually program via Guardrails IV Fluids)  250 mL Intravenous Continuous Reyden Smith A, MD   Stopped at 11/22/23 1406   sodium chloride  flush (NS) 0.9 % injection 10 mL  10 mL Intracatheter PRN Zaydon Kinser A, MD       ALLERGIES:   Allergies  Allergen Reactions   Codeine Nausea And Vomiting    Made "very sick"   Tape     Tears skin - surgical tape   FAMILY HISTORY:   Family History  Problem Relation Age of Onset   Hypertension Mother    Prostate cancer Father    Colon cancer Father    Hypertension Brother    Rheum arthritis Maternal  Grandmother    Diabetes Paternal Grandmother    Heart disease Paternal Grandfather    Colon cancer Child   Daughter has colon cancer  SOCIAL HISTORY:  The patient was born and raised in [place].  She currently lives with her daughter in town. She is divorced, with 2 children, 5 grandchildren, and 2 great-grandchildren.  She did textile work for  30 years.  She smoked a pack of cigarettes daily for 35 years before quitting 13 years ago.  There is no history of alcohol abuse.  REVIEW OF SYSTEMS:  Review of Systems  Constitutional:  Positive for unexpected weight change. Negative for fatigue and fever.  HENT:   Negative for hearing loss and sore throat.   Eyes:  Negative for eye problems.  Respiratory:  Positive for shortness of breath. Negative for chest tightness, cough and hemoptysis.   Cardiovascular:  Negative for chest pain and palpitations.  Gastrointestinal:  Negative for abdominal distention, abdominal pain, blood in stool, constipation, diarrhea, nausea and vomiting.  Endocrine: Negative for hot flashes.  Genitourinary:  Negative for difficulty urinating, dysuria, frequency, hematuria and nocturia.   Musculoskeletal:  Positive for arthralgias. Negative for back pain, gait problem and myalgias.  Skin: Negative.  Negative for itching and rash.  Neurological: Negative.  Negative for dizziness, extremity weakness, gait problem, headaches, light-headedness and numbness.  Hematological: Negative.   Psychiatric/Behavioral: Negative.  Negative for depression and suicidal ideas. The patient is not nervous/anxious.      PHYSICAL EXAM:  Blood pressure 117/64, pulse (!) 101, temperature 98.1 F (36.7 C), temperature source Oral, resp. rate 14, height 5\' 5"  (1.651 m), SpO2 100%. Wt Readings from Last 3 Encounters:  10/22/23 191 lb 5.8 oz (86.8 kg)  09/21/23 200 lb (90.7 kg)  08/24/23 195 lb (88.5 kg)   Body mass index is 31.84 kg/m. Performance status (ECOG): 2 - Symptomatic, <50%  confined to bed Physical Exam Constitutional:      Appearance: Normal appearance. She is obese. She is not ill-appearing.     Comments: She has a chronically ill appearance.  She is in a wheelchair and is wearing oxygen per nasal cannula.  HENT:     Mouth/Throat:     Mouth: Mucous membranes are moist.     Pharynx: Oropharynx is clear. No oropharyngeal exudate or posterior oropharyngeal erythema.  Cardiovascular:     Rate and Rhythm: Normal rate and regular rhythm.     Heart sounds: No murmur heard.    No friction rub. No gallop.  Pulmonary:     Effort: Pulmonary effort is normal. No respiratory distress.     Breath sounds: Decreased air movement present. Decreased breath sounds present. No wheezing, rhonchi or rales.  Abdominal:     General: Bowel sounds are normal. There is no distension.     Palpations: Abdomen is soft. There is no mass.     Tenderness: There is no abdominal tenderness.  Musculoskeletal:        General: No swelling.     Right lower leg: No edema.     Left lower leg: No edema.  Lymphadenopathy:     Cervical: No cervical adenopathy.     Upper Body:     Right upper body: No supraclavicular or axillary adenopathy.     Left upper body: No supraclavicular or axillary adenopathy.     Lower Body: No right inguinal adenopathy. No left inguinal adenopathy.  Skin:    General: Skin is warm.     Coloration: Skin is not jaundiced.     Findings: No lesion or rash.  Neurological:     General: No focal deficit present.     Mental Status: She is alert and oriented to person, place, and time. Mental status is at baseline.  Psychiatric:        Mood and Affect: Mood normal.        Behavior:  Behavior normal.        Thought Content: Thought content normal.   LABS:      Latest Ref Rng & Units 11/17/2023    2:34 PM 10/25/2023    6:29 AM 10/24/2023    6:41 PM  CBC  WBC 4.0 - 10.5 K/uL 8.3  6.5  8.8   Hemoglobin 12.0 - 15.0 g/dL 7.3  8.1  8.0   Hematocrit 36.0 - 46.0 % 24.7   25.9  25.8   Platelets 150 - 400 K/uL 199  118  98       Latest Ref Rng & Units 11/17/2023    2:34 PM 10/25/2023    6:29 AM 10/24/2023    6:36 AM  CMP  Glucose 70 - 99 mg/dL 97  92  94   BUN 8 - 23 mg/dL 40  28  23   Creatinine 0.44 - 1.00 mg/dL 5.62  1.30  8.65   Sodium 135 - 145 mmol/L 135  133  132   Potassium 3.5 - 5.1 mmol/L 3.8  2.9  3.5   Chloride 98 - 111 mmol/L 98  95  99   CO2 22 - 32 mmol/L 25  26  25    Calcium  8.9 - 10.3 mg/dL 9.0  8.0  7.7   Total Protein 6.5 - 8.1 g/dL 6.2     Total Bilirubin 0.0 - 1.2 mg/dL 0.3     Alkaline Phos 38 - 126 U/L 205     AST 15 - 41 U/L 50     ALT 0 - 44 U/L 28      Lab Results  Component Value Date   CEA 4.18 11/17/2023     Latest Reference Range & Units 11/21/23 12:07  Iron 28 - 170 ug/dL 30  UIBC ug/dL 784  TIBC 696 - 295 ug/dL 284  Saturation Ratios 10.4 - 31.8 % 9 (L)  Ferritin 11 - 307 ng/mL 65  (L): Data is abnormally low  PATHOLOGY:  Her duodenal mass biopsy in April 2025 revealed the following: Diagnosis   A: Duodenum, mass, biopsy - Moderate to poorly differentiated adenocarcinoma with mucinous differentiation; see comment   B: Duodenum, mass, biopsy - Predominantly duodenal adenoma (tubular adenoma) with rare focus of high-grade dysplasia and detached small fragments of mucinous adenocarcinoma    ASSESSMENT & PLAN:  A 78 y.o. female who I was asked to consult upon for duodenal adenocarcinoma.  To date, there has been no definitive evidence of her having metastatic disease.  However, to rule this out, I will have this patient undergo a PET scan within the forthcoming weeks.  Ideally, I would like to give this patient some form of systemic therapy to see if it could reduce her disease burden to where there would be potential for surgical resection.  However, this patient has very suboptimal health.  I am very concerned with how well she could tolerate systemic chemotherapy.  Based upon this, I will have Guardant testing  done on both her blood and tumor to see if her disease may be sensitive to other agents, such as immunotherapy.  That would be the ideal situation, as immunotherapy could be used, which would not significantly impact her daily quality of life.  I will tentatively see this patient back in 3 weeks to go over both her PET scan images, Guardant test results, and their implications.  Of note, as her hemoglobin is below 8 today, I will arrange for her to be transfused 2 units  of blood next week.  The patient and her family understand all the plans discussed today and are in agreement with them.  I do appreciate Rothfuss, Jacob T, PA-C for his new consult.   Ardean Simonich Felicia Horde, MD

## 2023-11-17 ENCOUNTER — Encounter: Payer: Self-pay | Admitting: Oncology

## 2023-11-17 ENCOUNTER — Telehealth: Payer: Self-pay | Admitting: Oncology

## 2023-11-17 ENCOUNTER — Other Ambulatory Visit: Payer: Self-pay | Admitting: Oncology

## 2023-11-17 ENCOUNTER — Inpatient Hospital Stay

## 2023-11-17 ENCOUNTER — Inpatient Hospital Stay: Attending: Oncology | Admitting: Oncology

## 2023-11-17 VITALS — BP 117/64 | HR 101 | Temp 98.1°F | Resp 14 | Ht 65.0 in

## 2023-11-17 DIAGNOSIS — Z8719 Personal history of other diseases of the digestive system: Secondary | ICD-10-CM

## 2023-11-17 DIAGNOSIS — Z8042 Family history of malignant neoplasm of prostate: Secondary | ICD-10-CM | POA: Diagnosis not present

## 2023-11-17 DIAGNOSIS — C170 Malignant neoplasm of duodenum: Secondary | ICD-10-CM

## 2023-11-17 DIAGNOSIS — Z87891 Personal history of nicotine dependence: Secondary | ICD-10-CM | POA: Diagnosis not present

## 2023-11-17 DIAGNOSIS — Z800 Family history of malignant neoplasm of digestive organs: Secondary | ICD-10-CM | POA: Insufficient documentation

## 2023-11-17 DIAGNOSIS — D649 Anemia, unspecified: Secondary | ICD-10-CM

## 2023-11-17 LAB — CMP (CANCER CENTER ONLY)
ALT: 28 U/L (ref 0–44)
AST: 50 U/L — ABNORMAL HIGH (ref 15–41)
Albumin: 2.7 g/dL — ABNORMAL LOW (ref 3.5–5.0)
Alkaline Phosphatase: 205 U/L — ABNORMAL HIGH (ref 38–126)
Anion gap: 12 (ref 5–15)
BUN: 40 mg/dL — ABNORMAL HIGH (ref 8–23)
CO2: 25 mmol/L (ref 22–32)
Calcium: 9 mg/dL (ref 8.9–10.3)
Chloride: 98 mmol/L (ref 98–111)
Creatinine: 1.25 mg/dL — ABNORMAL HIGH (ref 0.44–1.00)
GFR, Estimated: 44 mL/min — ABNORMAL LOW (ref >60)
Glucose, Bld: 97 mg/dL (ref 70–99)
Potassium: 3.8 mmol/L (ref 3.5–5.1)
Sodium: 135 mmol/L (ref 135–145)
Total Bilirubin: 0.3 mg/dL (ref 0.0–1.2)
Total Protein: 6.2 g/dL — ABNORMAL LOW (ref 6.5–8.1)

## 2023-11-17 LAB — CBC WITH DIFFERENTIAL (CANCER CENTER ONLY)
Abs Immature Granulocytes: 0.08 10*3/uL — ABNORMAL HIGH (ref 0.00–0.07)
Basophils Absolute: 0 10*3/uL (ref 0.0–0.1)
Basophils Relative: 0 %
Eosinophils Absolute: 0.3 10*3/uL (ref 0.0–0.5)
Eosinophils Relative: 4 %
HCT: 24.7 % — ABNORMAL LOW (ref 36.0–46.0)
Hemoglobin: 7.3 g/dL — ABNORMAL LOW (ref 12.0–15.0)
Immature Granulocytes: 1 %
Lymphocytes Relative: 8 %
Lymphs Abs: 0.6 10*3/uL — ABNORMAL LOW (ref 0.7–4.0)
MCH: 27.2 pg (ref 26.0–34.0)
MCHC: 29.6 g/dL — ABNORMAL LOW (ref 30.0–36.0)
MCV: 92.2 fL (ref 80.0–100.0)
Monocytes Absolute: 0.7 10*3/uL (ref 0.1–1.0)
Monocytes Relative: 9 %
Neutro Abs: 6.6 10*3/uL (ref 1.7–7.7)
Neutrophils Relative %: 78 %
Platelet Count: 199 10*3/uL (ref 150–400)
RBC: 2.68 MIL/uL — ABNORMAL LOW (ref 3.87–5.11)
RDW: 17 % — ABNORMAL HIGH (ref 11.5–15.5)
WBC Count: 8.3 10*3/uL (ref 4.0–10.5)
nRBC: 0 % (ref 0.0–0.2)

## 2023-11-17 LAB — CEA (ACCESS): CEA (CHCC): 4.18 ng/mL (ref 0.00–5.00)

## 2023-11-17 NOTE — Telephone Encounter (Signed)
 Patient has been scheduled for follow-up visit per 11/15/23 LOS.  Pt aware of scheduled appt details.

## 2023-11-21 ENCOUNTER — Inpatient Hospital Stay

## 2023-11-21 DIAGNOSIS — Z800 Family history of malignant neoplasm of digestive organs: Secondary | ICD-10-CM | POA: Diagnosis not present

## 2023-11-21 DIAGNOSIS — Z87891 Personal history of nicotine dependence: Secondary | ICD-10-CM | POA: Diagnosis not present

## 2023-11-21 DIAGNOSIS — C170 Malignant neoplasm of duodenum: Secondary | ICD-10-CM | POA: Diagnosis not present

## 2023-11-21 LAB — IRON AND TIBC
Iron: 30 ug/dL (ref 28–170)
Saturation Ratios: 9 % — ABNORMAL LOW (ref 10.4–31.8)
TIBC: 337 ug/dL (ref 250–450)
UIBC: 307 ug/dL

## 2023-11-21 LAB — FERRITIN: Ferritin: 65 ng/mL (ref 11–307)

## 2023-11-21 LAB — PREPARE RBC (CROSSMATCH)

## 2023-11-22 ENCOUNTER — Inpatient Hospital Stay

## 2023-11-22 DIAGNOSIS — C170 Malignant neoplasm of duodenum: Secondary | ICD-10-CM | POA: Diagnosis not present

## 2023-11-22 DIAGNOSIS — Z800 Family history of malignant neoplasm of digestive organs: Secondary | ICD-10-CM | POA: Diagnosis not present

## 2023-11-22 DIAGNOSIS — D649 Anemia, unspecified: Secondary | ICD-10-CM

## 2023-11-22 DIAGNOSIS — Z87891 Personal history of nicotine dependence: Secondary | ICD-10-CM | POA: Diagnosis not present

## 2023-11-22 MED ORDER — ACETAMINOPHEN 325 MG PO TABS
650.0000 mg | ORAL_TABLET | Freq: Once | ORAL | Status: AC
  Administered 2023-11-22: 650 mg via ORAL
  Filled 2023-11-22: qty 2

## 2023-11-22 MED ORDER — SODIUM CHLORIDE 0.9% FLUSH: 10.0000 mL | INTRAVENOUS | Status: DC | PRN

## 2023-11-22 MED ORDER — DIPHENHYDRAMINE HCL 25 MG PO CAPS
25.0000 mg | ORAL_CAPSULE | Freq: Once | ORAL | Status: AC
  Administered 2023-11-22: 25 mg via ORAL
  Filled 2023-11-22: qty 1

## 2023-11-22 MED ORDER — SODIUM CHLORIDE 0.9% IV SOLUTION
250.0000 mL | INTRAVENOUS | Status: DC
  Administered 2023-11-22 (×2): 250 mL via INTRAVENOUS

## 2023-11-22 NOTE — Patient Instructions (Signed)

## 2023-11-23 ENCOUNTER — Ambulatory Visit: Admitting: Gastroenterology

## 2023-11-23 LAB — TYPE AND SCREEN
ABO/RH(D): O POS
Antibody Screen: NEGATIVE
Unit division: 0
Unit division: 0

## 2023-11-23 LAB — BPAM RBC
Blood Product Expiration Date: 202506292359
Blood Product Unit Number: 202506292359
ISSUE DATE / TIME: 202505280728
PRODUCT CODE: 202505280728
PRODUCT CODE: 202506292359
Unit Type and Rh: 5100
Unit Type and Rh: 202506292359
Unit Type and Rh: 5100
Unit Type and Rh: 5100

## 2023-11-23 NOTE — Progress Notes (Deleted)
 Peggy Armstrong

## 2023-11-27 ENCOUNTER — Other Ambulatory Visit: Payer: Self-pay

## 2023-11-27 ENCOUNTER — Ambulatory Visit

## 2023-11-27 VITALS — BP 124/70 | HR 77 | Ht 60.0 in | Wt 185.0 lb

## 2023-11-27 DIAGNOSIS — I480 Paroxysmal atrial fibrillation: Secondary | ICD-10-CM | POA: Diagnosis not present

## 2023-11-27 DIAGNOSIS — M6281 Muscle weakness (generalized): Secondary | ICD-10-CM | POA: Diagnosis not present

## 2023-11-27 DIAGNOSIS — Z741 Need for assistance with personal care: Secondary | ICD-10-CM | POA: Diagnosis not present

## 2023-11-27 DIAGNOSIS — R262 Difficulty in walking, not elsewhere classified: Secondary | ICD-10-CM | POA: Diagnosis not present

## 2023-11-27 DIAGNOSIS — R2681 Unsteadiness on feet: Secondary | ICD-10-CM | POA: Diagnosis not present

## 2023-11-27 DIAGNOSIS — I5032 Chronic diastolic (congestive) heart failure: Secondary | ICD-10-CM | POA: Diagnosis not present

## 2023-11-27 DIAGNOSIS — Z7409 Other reduced mobility: Secondary | ICD-10-CM | POA: Diagnosis not present

## 2023-11-27 NOTE — Assessment & Plan Note (Addendum)
 Remains in sinus rhythm. Continue amiodarone  200 mg once daily. Continue Eliquis  5 mg twice daily. Reports no further bleeding.  From cardiac standpoint okay to hold Eliquis  as needed if risk of bleeding outweighs the benefits of stroke risk reduction from A-fib/flutter.

## 2023-11-27 NOTE — Assessment & Plan Note (Addendum)
 Appears compensated and euvolemic. Weight down to 185 pounds today in comparison to 200 pounds at last office visit with us  in March.  Continue with salt restriction to below 2 g/day and fluid restriction to below 2 L/day. Continue with torsemide  40 mg and 20 mg alternating doses once daily. Continue daily weight monitoring.  Continue with current dose of diltiazem  120 mg once daily. Continue with potassium chloride  20 mEq supplement.  Blood pressure limits further escalation of therapy. Not a candidate for SGLT2 inhibitors or Entresto given her risk for urinary tract and perineal infections and CKD.

## 2023-11-27 NOTE — Patient Instructions (Signed)
Medication Instructions:  Your physician recommends that you continue on your current medications as directed. Please refer to the Current Medication list given to you today.  *If you need a refill on your cardiac medications before your next appointment, please call your pharmacy*   Lab Work: None ordered If you have labs (blood work) drawn today and your tests are completely normal, you will receive your results only by: MyChart Message (if you have MyChart) OR A paper copy in the mail If you have any lab test that is abnormal or we need to change your treatment, we will call you to review the results.   Testing/Procedures: None ordered   Follow-Up: At [facility], you and your health needs are our priority.  As part of our continuing mission to provide you with exceptional heart care, we have created designated Provider Care Teams.  These Care Teams include your primary Cardiologist (physician) and Advanced Practice Providers (APPs -  Physician Assistants and Nurse Practitioners) who all work together to provide you with the care you need, when you need it.  We recommend signing up for the patient portal called "MyChart".  Sign up information is provided on this After Visit Summary.  MyChart is used to connect with patients for Virtual Visits (Telemedicine).  Patients are able to view lab/test results, encounter notes, upcoming appointments, etc.  Non-urgent messages can be sent to your provider as well.   To learn more about what you can do with MyChart, go to [email or web address].    Your next appointment:   3 month(s)  The format for your next appointment:   In Person  Provider:   Huntley Dec, MD    Other Instructions none  Important Information About Sugar

## 2023-11-27 NOTE — Progress Notes (Signed)
 Cardiology Consultation:    Date:  11/27/2023   ID:  Peggy Armstrong, DOB Nov 22, 1945, MRN [id number]  PCP:  Peggy Corrente, PA-C  Cardiologist:  Peggy Evans Tilla Wilborn, MD   Referring MD: Peggy Corrente, PA-C   No chief complaint on file.    ASSESSMENT AND PLAN:   Peggy Armstrong 78/F has history of chronic diastolic heart failure with preserved LVEF, paroxysmal atrial fibrillation CHA2DS2-VASc score 5 on rhythm with amiodarone , AAA, hypertension, GERD, history of recurrent GI bleed, rheumatoid arthritis, CKD stage III, history of DVT, dyslipidemia, echocardiogram December 2024 with normal biventricular function of EF 55 to 60% and mildly elevated RVSP. Since her last visit with me she had procedure at 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  On April 26 she sustained a mechanical fall and resulted in left humeral head fracture that had been conservatively managed.  Here for follow-up visit, participating in rehab at the facility and seeing improvement in her left upper extremity movement. Following up with Dr. Harles Armstrong at cancer care  Problem List Items Addressed This Visit     Chronic diastolic CHF (congestive heart failure) (HCC)   Appears compensated and euvolemic. Weight down to 185 pounds today in comparison to 200 pounds at last office visit with us  in March.  Continue with salt restriction to below 2 g/day and fluid restriction to below 2 L/day. Continue with torsemide  40 mg and 20 mg alternating doses once daily. Continue daily weight monitoring.  Continue with current dose of diltiazem  120 mg once daily. Continue with potassium chloride  20 mEq supplement.  Blood pressure limits further escalation of therapy. Not a  candidate for SGLT2 inhibitors or Entresto given her risk for urinary tract and perineal infections and CKD.       Relevant Medications   diltiazem  (CARDIZEM  CD) 120 MG 24 hr capsule   Paroxysmal atrial fibrillation (HCC) - Primary   Remains in sinus rhythm. Continue amiodarone  200 mg once daily. Continue Eliquis  5 mg twice daily. Reports no further bleeding.  From cardiac standpoint okay to hold Eliquis  as needed if risk of bleeding outweighs the benefits of stroke risk reduction from A-fib/flutter.      Relevant Medications   diltiazem  (CARDIZEM  CD) 120 MG 24 hr capsule   Other Relevant Orders   EKG 12-Lead (Completed)    Return to me for follow-up tentatively in 3 months.  History of Present Illness:    Peggy Armstrong is a 78 y.o. female who is being seen today for follow-up visit. PCP is Rothfuss, Jacob T, PA-C.  Next Last visit in the office with me was for 09-21-2023.  Here for the visit today by herself.  Currently staying at [facility] rehab facility.  She has history of chronic diastolic heart failure with preserved LVEF, paroxysmal atrial fibrillation CHA2DS2-VASc score 5 on rhythm with amiodarone , AAA, hypertension, GERD, history of recurrent GI bleed, rheumatoid arthritis, CKD stage III, history of DVT, dyslipidemia, echocardiogram December 2024 with normal biventricular function of EF 55 to 60% and mildly elevated RVSP. 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  On April 26 she sustained a mechanical fall and resulted in left humeral head fracture that had been conservatively managed.    EKG in the clinic today shows sinus rhythm with heart rate 77/min, P waves appear  to be clear in lead V4, otherwise there is significant flutter like artifact on baseline.  Recent CBC from 11-17-2023 hemoglobin 7.3, hematocrit 24.7, platelets 199, WBC 2.68. Following this she had iron panel workup and type and screen workup. BUN 40, creatinine 1.25, EGFR 44. AST mildly elevated 50, ALT normal at 28, alkaline phosphatase elevated 205. Sodium 135, potassium 3.8.  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   Cellulitis of left arm 08/14/2023   Cholangitis    Choledocholithiasis 03/22/2017   Chronic diastolic CHF (congestive heart failure) (HCC) 11/08/2019   Chronic low back pain 07/18/2023   CKD (chronic kidney disease) stage 3, GFR 30-59 ml/min (HCC) 06/09/2017   CKD (chronic kidney disease), stage III (HCC) 06/09/2017   Closed compression fracture of L2 lumbar vertebra, initial encounter (HCC) 05/11/2023   Clotting disorder (HCC)    right DVT     Dehydration 05/12/2023   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   Duodenal mass    Duodenal ulcer    DVT (deep venous thrombosis) (HCC)    right DVT   Epigastric pain    Essential hypertension    Fall at home, initial encounter 10/21/2023   GAD (generalized anxiety disorder)    Gastric polyps 05/16/2023   Generalized weakness 05/12/2023   GI bleed 07/20/2023   Gout    Gout    Hematemesis 11/05/2017   Hemorrhagic shock (HCC) 07/21/2023   History of depression 10/21/2023   History of DVT (deep vein thrombosis) 11/08/2019   History of GI bleed 10/21/2023   Hyperlipidemia 05/12/2023   Hypertension    Hypokalemia 07/03/2017    Hypothyroidism 05/12/2023   Iron deficiency anemia 08/02/2016   Left elbow avulsion fracture 10/21/2023   Left humeral fracture 10/21/2023   Long term current use of anticoagulant therapy 06/20/2023   Macrocytic anemia 11/05/2017   Melena 11/05/2017   Morbid obesity (HCC) 11/08/2019   Nausea & vomiting    Need for immunization against influenza 07/18/2019   Need for vaccination 04/23/2018   Occult blood in stools 05/16/2023   Paroxysmal atrial fibrillation (HCC) 05/12/2023   Peri-ampullary neoplasm    Poor dentition 08/14/2016   RA (rheumatoid arthritis) (HCC)    Restless leg 12/02/2022   Right shoulder pain 07/26/2018   Routine general medical examination at a health care facility 12/13/2019   S/P ERCP 05/05/2017   SIRS (systemic inflammatory response syndrome) (HCC) 03/22/2017   Upper GI bleed 06/20/2023    Past Surgical History:  Procedure Laterality Date   ABDOMINAL HYSTERECTOMY     BIOPSY  05/16/2023   Procedure: BIOPSY;  Surgeon: Asencion Blacksmith, MD;  Location: [facility];  Service: Gastroenterology;;   CHOLECYSTECTOMY     ENDOSCOPIC RETROGRADE CHOLANGIOPANCREATOGRAPHY (ERCP) WITH PROPOFOL  N/A 06/15/2017   Procedure: ENDOSCOPIC RETROGRADE  CHOLANGIOPANCREATOGRAPHY (ERCP) WITH PROPOFOL ;  Surgeon: Janel Medford, MD;  Location: [facility];  Service: Endoscopy;  Laterality: N/A;   ESOPHAGOGASTRODUODENOSCOPY (EGD) WITH PROPOFOL  N/A 03/24/2017   Procedure: ESOPHAGOGASTRODUODENOSCOPY (EGD) WITH PROPOFOL ;  Surgeon: Albertina Hugger, MD;  Location: [facility];  Service: Gastroenterology;  Laterality: N/A;   ESOPHAGOGASTRODUODENOSCOPY (EGD) WITH PROPOFOL  N/A 11/07/2017   Procedure: ESOPHAGOGASTRODUODENOSCOPY (EGD) WITH PROPOFOL ;  Surgeon: Janel Medford, MD;  Location: [facility];  Service: Endoscopy;  Laterality: N/A;   ESOPHAGOGASTRODUODENOSCOPY (EGD) WITH PROPOFOL  N/A 05/16/2023   Procedure: ESOPHAGOGASTRODUODENOSCOPY (EGD) WITH PROPOFOL ;  Surgeon: Asencion Blacksmith,  MD;  Location: [facility];  Service: Gastroenterology;  Laterality: N/A;   ESOPHAGOGASTRODUODENOSCOPY (EGD) WITH PROPOFOL  N/A 07/21/2023   Procedure: ESOPHAGOGASTRODUODENOSCOPY (EGD) WITH PROPOFOL ;  Surgeon: Lajuan Pila, MD;  Location[facility] END[facility]COPY;  Service: Gastroenterology;  Laterality: N/A;   EUS N/A 04/06/2017   Procedure: UPPER ENDOSCOPIC ULTRASOUND (EUS) LINEAR;  Surgeon: Janel Medford, MD;  Location: [facility];  Service: Endoscopy;  Laterality: N/A;  to evaluate duodenal mass   HEMOSTASIS CLIP PLACEMENT  07/21/2023   Procedure: HEMOSTASIS CLIP PLACEMENT;  Surgeon: Lajuan Pila, MD;  Location: [facility] ENDOSCOPY;  Service: Gastroenterology;;   HEMOSTASIS CONTROL  07/21/2023   Procedure: HEMOSTASIS CONTROL;  Surgeon: Lajuan Pila, MD;  Location: [facility] ENDOSCOPY;  Service: Gastroenterology;;   HERNIA REPAIR     IR ANGIOGRAM VISCERAL SELECTIVE  07/22/2023   IR BILIARY DRAIN PLACEMENT WITH CHOLANGIOGRAM  03/25/2017   IR CONVERT BILIARY DRAIN TO INT EXT BILIARY DRAIN  04/03/2017   IR EMBO ART  VEN HEMORR LYMPH EXTRAV  INC GUIDE ROADMAPPING  07/22/2023   IR US  GUIDE VASC ACCESS RIGHT  07/22/2023   POLYPECTOMY  05/16/2023   Procedure: POLYPECTOMY;  Surgeon: Asencion Blacksmith, MD;  Location: [facility];  Service: Gastroenterology;;    Current Medications: Current Meds  Medication Sig   acetaminophen  (TYLENOL ) 325 MG tablet Take 650 mg by mouth every 6 (six) hours as needed for mild pain (pain score 1-3) or moderate pain (pain score 4-6).   allopurinol  (ZYLOPRIM ) 300 MG tablet Take 1 tablet (300 mg total) by mouth daily.   Amino Acids-Protein Hydrolys (PRO-STAT PO) Take by mouth.   amiodarone  (PACERONE ) 200 MG tablet Take 1 tablet (200 mg total) by mouth daily.   Ascorbic Acid (VITAMIN C CR) 500 MG TBCR Take 500 mg by mouth daily.   atorvastatin  (LIPITOR) 20 MG tablet Take 1 tablet (20 mg total) by mouth daily.   busPIRone  (BUSPAR ) 10 MG tablet TAKE 1 TABLET BY MOUTH TWICE A DAY    cholecalciferol  (VITAMIN D) 1000 units tablet Take 1,000 Units by mouth daily.   cyanocobalamin  (VITAMIN B12) 1000 MCG tablet Take 1 tablet by mouth daily.   diltiazem  (CARDIZEM  CD) 120 MG 24 hr capsule Take 120 mg by mouth daily.   DULoxetine  (CYMBALTA ) 20 MG capsule TAKE 1 CAPSULE BY MOUTH EVERY DAY   ELIQUIS  5 MG TABS tablet Take 1 tablet (5 mg total) by mouth 2 (two) times daily.   ferrous sulfate  325 (65 FE) MG tablet Take 325 mg by mouth daily with breakfast.   folic acid  (FOLVITE ) 1 MG tablet Take 1 tablet (1 mg total) by mouth daily.   KLOR-CON  M20 20 MEQ tablet Take 20 mEq by mouth daily.   levothyroxine  (SYNTHROID ) 100 MCG tablet Take 1 tablet (100 mcg total) by mouth daily before breakfast.   melatonin 3 MG TABS tablet Take 1 tablet (3 mg total) by mouth at  bedtime as needed (insomnia).   methocarbamol  (ROBAXIN ) 500 MG tablet TAKE 1 TABLET BY MOUTH EVERY 6 HOURS AS NEEDED FOR MUSCLE SPASMS.   ondansetron  (ZOFRAN ) 4 MG tablet Take 4 mg by mouth every 6 (six) hours as needed for nausea or vomiting.   pantoprazole  (PROTONIX ) 40 MG tablet Take 1 tablet (40 mg total) by mouth daily.   torsemide  (DEMADEX ) 20 MG tablet Take Torsemide  on alternating days40 mg one day (2 tablets) then the next day 20 mg (1 tablet).     Allergies:   Codeine and Tape   Social History   Socioeconomic History   Marital status: Divorced    Spouse name: Not on file   Number of children: 2   Years of education: 11   Highest education level: Not on file  Occupational History   Occupation: RETIRED - FACTORY WORKER  Tobacco Use   Smoking status: Former    Types: Cigarettes    Passive exposure: Past   Smokeless tobacco: Former   Tobacco comments:    Quit in 2012. Started at 20's. Cannot recall how many.   Vaping Use   Vaping status: Never Used  Substance and Sexual Activity   Alcohol use: No   Drug use: No   Sexual activity: Yes    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  Other Topics Concern   Not on  file  Social History Narrative   Not on file   Social Drivers of Health   Financial Resource Strain: Low Risk  (12/02/2022)   Received from [facility], [facility]   Overall Financial Resource Strain (CARDIA)    Difficulty of Paying Living Expenses: Not very hard  Food Insecurity: No Food Insecurity (11/17/2023)   Hunger Vital Sign    Worried About Running Out of Food in the Last Year: Never true    Ran Out of Food in the Last Year: Never true  Transportation Needs: No Transportation Needs (11/17/2023)   PRAPARE - Administrator, Civil Service (Medical): No    Lack of Transportation (Non-Medical): No  Physical Activity: Unknown (04/05/2022)   Received from [facility], [facility]   Exercise Vital Sign    Days of Exercise per Week: 0 days    Minutes of Exercise per Session: Not on file  Stress: No Stress Concern Present (04/05/2022)   Received from [facility], [facility] - Occupational Stress Questionnaire    Feeling of Stress : Not at all  Social Connections: Moderately Isolated (10/22/2023)   Social Connection and Isolation Panel [NHANES]    Frequency of Communication with Friends and Family: Twice a week    Frequency of Social Gatherings with Friends and Family: Twice a week    Attends Religious Services: 1 to 4 times per year    Active Member of [employer] or Organizations: No    Attends Engineer, structural: Never    Marital Status: Divorced     Family History: The patient's family history includes Colon cancer in her child and father; Diabetes in her paternal grandmother; Heart disease in her paternal grandfather; Hypertension in her brother and mother; Prostate cancer in her father; Rheum arthritis in her maternal grandmother. ROS:   Please see the history of present illness.    All 14 point review of systems negative except as described per history of present illness.  EKGs/Labs/Other Studies  Reviewed:    The following studies were reviewed today:   EKG:  EKG Interpretation Date/Time:  Monday November 27 2023 15:24:43 EDT Ventricular Rate:  77 PR Interval:  150 QRS Duration:  90 QT Interval:  366 QTC Calculation: 414 R Axis:   229  Text Interpretation: Normal sinus rhythm with flutter like artifact Right superior axis deviation Possible Right ventricular hypertrophy Possible Anterior infarct (cited on or before 21-Sep-2023) Abnormal ECG When compared with ECG of 22-Oct-2023 01:59, Significant changes have occurred Confirmed by Bertha Broad reddy [phone]) on 11/27/2023 4:07:17 PM    Recent Labs: 07/18/2023: TSH 5.230 08/24/2023: NT-Pro BNP 1,402 10/25/2023: Magnesium  1.5 11/17/2023: ALT 28; BUN 40; Creatinine 1.25; Hemoglobin 7.3; Platelet Count 199; Potassium 3.8; Sodium 135  Recent Lipid Panel    Component Value Date/Time   CHOL 127 07/18/2023 1542   TRIG 122 07/18/2023 1542   HDL 25 (L) 07/18/2023 1542   CHOLHDL 5.1 (H) 07/18/2023 1542   LDLCALC 80 07/18/2023 1542    Physical Exam:    VS:  BP 124/70   Pulse 77   Ht 5' (1.524 m)   Wt 185 lb (83.9 kg)   SpO2 98%   BMI 36.13 kg/m     Wt Readings from Last 3 Encounters:  11/27/23 185 lb (83.9 kg)  10/22/23 191 lb 5.8 oz (86.8 kg)  09/21/23 200 lb (90.7 kg)     GENERAL:  Well nourished, well developed in no acute distress NECK: No JVD; No carotid bruits CARDIAC: RRR, S1 and S2 present, no murmurs, no rubs, no gallops CHEST:  Clear to auscultation without rales, wheezing or rhonchi  Extremities: No pitting pedal edema. Pulses bilaterally symmetric with radial 2+ and dorsalis pedis 2+ NEUROLOGIC:  Alert and oriented x 3  Medication Adjustments/Labs and Tests Ordered: Current medicines are reviewed at length with the patient today.  Concerns regarding medicines are outlined above.  Orders Placed This Encounter  Procedures   EKG 12-Lead   No orders of the defined types were placed in this  encounter.   Signed, Lura Sallies, MD, MPH, [facility]. 11/27/2023 4:26 PM    [facility]

## 2023-11-27 NOTE — Assessment & Plan Note (Signed)
" >>  ASSESSMENT AND PLAN FOR PAROXYSMAL ATRIAL FIBRILLATION (HCC) WRITTEN ON 11/27/2023  4:26 PM BY Jayleigh Notarianni, ALEAN SAUNDERS, MD  Remains in sinus rhythm. Continue amiodarone  200 mg once daily. Continue Eliquis  5 mg twice daily. Reports no further bleeding.  From cardiac standpoint okay to hold Eliquis  as needed if risk of bleeding outweighs the benefits of stroke risk reduction from A-fib/flutter. "

## 2023-11-28 DIAGNOSIS — R262 Difficulty in walking, not elsewhere classified: Secondary | ICD-10-CM | POA: Diagnosis not present

## 2023-11-28 DIAGNOSIS — Z741 Need for assistance with personal care: Secondary | ICD-10-CM | POA: Diagnosis not present

## 2023-11-28 DIAGNOSIS — M6281 Muscle weakness (generalized): Secondary | ICD-10-CM | POA: Diagnosis not present

## 2023-11-28 DIAGNOSIS — Z7409 Other reduced mobility: Secondary | ICD-10-CM | POA: Diagnosis not present

## 2023-11-28 DIAGNOSIS — R2681 Unsteadiness on feet: Secondary | ICD-10-CM | POA: Diagnosis not present

## 2023-11-29 DIAGNOSIS — M6281 Muscle weakness (generalized): Secondary | ICD-10-CM | POA: Diagnosis not present

## 2023-11-29 DIAGNOSIS — C170 Malignant neoplasm of duodenum: Secondary | ICD-10-CM | POA: Diagnosis not present

## 2023-11-29 DIAGNOSIS — R262 Difficulty in walking, not elsewhere classified: Secondary | ICD-10-CM | POA: Diagnosis not present

## 2023-11-29 DIAGNOSIS — Z741 Need for assistance with personal care: Secondary | ICD-10-CM | POA: Diagnosis not present

## 2023-11-29 DIAGNOSIS — Z7409 Other reduced mobility: Secondary | ICD-10-CM | POA: Diagnosis not present

## 2023-11-29 DIAGNOSIS — R2681 Unsteadiness on feet: Secondary | ICD-10-CM | POA: Diagnosis not present

## 2023-11-30 ENCOUNTER — Other Ambulatory Visit: Payer: Self-pay

## 2023-11-30 DIAGNOSIS — R262 Difficulty in walking, not elsewhere classified: Secondary | ICD-10-CM | POA: Diagnosis not present

## 2023-11-30 DIAGNOSIS — R2681 Unsteadiness on feet: Secondary | ICD-10-CM | POA: Diagnosis not present

## 2023-11-30 DIAGNOSIS — M6281 Muscle weakness (generalized): Secondary | ICD-10-CM | POA: Diagnosis not present

## 2023-11-30 DIAGNOSIS — Z741 Need for assistance with personal care: Secondary | ICD-10-CM | POA: Diagnosis not present

## 2023-11-30 DIAGNOSIS — Z7409 Other reduced mobility: Secondary | ICD-10-CM | POA: Diagnosis not present

## 2023-11-30 NOTE — Progress Notes (Signed)
 This information is for the sake of discussion only and is not an official part of the treatment plan.

## 2023-12-01 DIAGNOSIS — R262 Difficulty in walking, not elsewhere classified: Secondary | ICD-10-CM | POA: Diagnosis not present

## 2023-12-01 DIAGNOSIS — Z741 Need for assistance with personal care: Secondary | ICD-10-CM | POA: Diagnosis not present

## 2023-12-01 DIAGNOSIS — Z7409 Other reduced mobility: Secondary | ICD-10-CM | POA: Diagnosis not present

## 2023-12-01 DIAGNOSIS — D649 Anemia, unspecified: Secondary | ICD-10-CM | POA: Diagnosis not present

## 2023-12-01 DIAGNOSIS — R2681 Unsteadiness on feet: Secondary | ICD-10-CM | POA: Diagnosis not present

## 2023-12-01 DIAGNOSIS — M6281 Muscle weakness (generalized): Secondary | ICD-10-CM | POA: Diagnosis not present

## 2023-12-03 DIAGNOSIS — Z741 Need for assistance with personal care: Secondary | ICD-10-CM | POA: Diagnosis not present

## 2023-12-03 DIAGNOSIS — Z7409 Other reduced mobility: Secondary | ICD-10-CM | POA: Diagnosis not present

## 2023-12-03 DIAGNOSIS — M6281 Muscle weakness (generalized): Secondary | ICD-10-CM | POA: Diagnosis not present

## 2023-12-03 DIAGNOSIS — R2681 Unsteadiness on feet: Secondary | ICD-10-CM | POA: Diagnosis not present

## 2023-12-03 DIAGNOSIS — R262 Difficulty in walking, not elsewhere classified: Secondary | ICD-10-CM | POA: Diagnosis not present

## 2023-12-04 DIAGNOSIS — R262 Difficulty in walking, not elsewhere classified: Secondary | ICD-10-CM | POA: Diagnosis not present

## 2023-12-04 DIAGNOSIS — M6281 Muscle weakness (generalized): Secondary | ICD-10-CM | POA: Diagnosis not present

## 2023-12-04 DIAGNOSIS — Z7409 Other reduced mobility: Secondary | ICD-10-CM | POA: Diagnosis not present

## 2023-12-04 DIAGNOSIS — D500 Iron deficiency anemia secondary to blood loss (chronic): Secondary | ICD-10-CM | POA: Diagnosis not present

## 2023-12-04 DIAGNOSIS — R2681 Unsteadiness on feet: Secondary | ICD-10-CM | POA: Diagnosis not present

## 2023-12-04 DIAGNOSIS — Z741 Need for assistance with personal care: Secondary | ICD-10-CM | POA: Diagnosis not present

## 2023-12-04 DIAGNOSIS — C170 Malignant neoplasm of duodenum: Secondary | ICD-10-CM | POA: Diagnosis not present

## 2023-12-05 DIAGNOSIS — Z7409 Other reduced mobility: Secondary | ICD-10-CM | POA: Diagnosis not present

## 2023-12-05 DIAGNOSIS — R2681 Unsteadiness on feet: Secondary | ICD-10-CM | POA: Diagnosis not present

## 2023-12-05 DIAGNOSIS — S32021D Stable burst fracture of second lumbar vertebra, subsequent encounter for fracture with routine healing: Secondary | ICD-10-CM | POA: Diagnosis not present

## 2023-12-05 DIAGNOSIS — R262 Difficulty in walking, not elsewhere classified: Secondary | ICD-10-CM | POA: Diagnosis not present

## 2023-12-05 DIAGNOSIS — Z741 Need for assistance with personal care: Secondary | ICD-10-CM | POA: Diagnosis not present

## 2023-12-05 DIAGNOSIS — M6281 Muscle weakness (generalized): Secondary | ICD-10-CM | POA: Diagnosis not present

## 2023-12-06 ENCOUNTER — Other Ambulatory Visit (HOSPITAL_COMMUNITY): Payer: Self-pay | Admitting: Family Care Home

## 2023-12-06 DIAGNOSIS — M6281 Muscle weakness (generalized): Secondary | ICD-10-CM | POA: Diagnosis not present

## 2023-12-06 DIAGNOSIS — Z741 Need for assistance with personal care: Secondary | ICD-10-CM | POA: Diagnosis not present

## 2023-12-06 DIAGNOSIS — R262 Difficulty in walking, not elsewhere classified: Secondary | ICD-10-CM | POA: Diagnosis not present

## 2023-12-06 DIAGNOSIS — R2681 Unsteadiness on feet: Secondary | ICD-10-CM | POA: Diagnosis not present

## 2023-12-06 DIAGNOSIS — Z7409 Other reduced mobility: Secondary | ICD-10-CM | POA: Diagnosis not present

## 2023-12-06 NOTE — Addendum Note (Signed)
 Addended by: Braddock Servellon M on: 12/06/2023 02:39 PM   Modules accepted: Orders

## 2023-12-07 ENCOUNTER — Inpatient Hospital Stay: Admitting: Oncology

## 2023-12-07 DIAGNOSIS — Z741 Need for assistance with personal care: Secondary | ICD-10-CM | POA: Diagnosis not present

## 2023-12-07 DIAGNOSIS — R2681 Unsteadiness on feet: Secondary | ICD-10-CM | POA: Diagnosis not present

## 2023-12-07 DIAGNOSIS — R262 Difficulty in walking, not elsewhere classified: Secondary | ICD-10-CM | POA: Diagnosis not present

## 2023-12-07 DIAGNOSIS — Z7409 Other reduced mobility: Secondary | ICD-10-CM | POA: Diagnosis not present

## 2023-12-07 DIAGNOSIS — M6281 Muscle weakness (generalized): Secondary | ICD-10-CM | POA: Diagnosis not present

## 2023-12-08 ENCOUNTER — Ambulatory Visit (HOSPITAL_COMMUNITY)

## 2023-12-08 DIAGNOSIS — Z741 Need for assistance with personal care: Secondary | ICD-10-CM | POA: Diagnosis not present

## 2023-12-08 DIAGNOSIS — Z7409 Other reduced mobility: Secondary | ICD-10-CM | POA: Diagnosis not present

## 2023-12-08 DIAGNOSIS — M6281 Muscle weakness (generalized): Secondary | ICD-10-CM | POA: Diagnosis not present

## 2023-12-08 DIAGNOSIS — R2681 Unsteadiness on feet: Secondary | ICD-10-CM | POA: Diagnosis not present

## 2023-12-08 DIAGNOSIS — R262 Difficulty in walking, not elsewhere classified: Secondary | ICD-10-CM | POA: Diagnosis not present

## 2023-12-10 DIAGNOSIS — R262 Difficulty in walking, not elsewhere classified: Secondary | ICD-10-CM | POA: Diagnosis not present

## 2023-12-10 DIAGNOSIS — Z7409 Other reduced mobility: Secondary | ICD-10-CM | POA: Diagnosis not present

## 2023-12-10 DIAGNOSIS — Z741 Need for assistance with personal care: Secondary | ICD-10-CM | POA: Diagnosis not present

## 2023-12-10 DIAGNOSIS — R2681 Unsteadiness on feet: Secondary | ICD-10-CM | POA: Diagnosis not present

## 2023-12-10 DIAGNOSIS — M6281 Muscle weakness (generalized): Secondary | ICD-10-CM | POA: Diagnosis not present

## 2023-12-11 DIAGNOSIS — Z741 Need for assistance with personal care: Secondary | ICD-10-CM | POA: Diagnosis not present

## 2023-12-11 DIAGNOSIS — M6281 Muscle weakness (generalized): Secondary | ICD-10-CM | POA: Diagnosis not present

## 2023-12-11 DIAGNOSIS — R2681 Unsteadiness on feet: Secondary | ICD-10-CM | POA: Diagnosis not present

## 2023-12-11 DIAGNOSIS — R262 Difficulty in walking, not elsewhere classified: Secondary | ICD-10-CM | POA: Diagnosis not present

## 2023-12-11 DIAGNOSIS — Z7409 Other reduced mobility: Secondary | ICD-10-CM | POA: Diagnosis not present

## 2023-12-12 DIAGNOSIS — Z741 Need for assistance with personal care: Secondary | ICD-10-CM | POA: Diagnosis not present

## 2023-12-12 DIAGNOSIS — M6281 Muscle weakness (generalized): Secondary | ICD-10-CM | POA: Diagnosis not present

## 2023-12-12 DIAGNOSIS — R2681 Unsteadiness on feet: Secondary | ICD-10-CM | POA: Diagnosis not present

## 2023-12-12 DIAGNOSIS — R262 Difficulty in walking, not elsewhere classified: Secondary | ICD-10-CM | POA: Diagnosis not present

## 2023-12-12 DIAGNOSIS — Z7409 Other reduced mobility: Secondary | ICD-10-CM | POA: Diagnosis not present

## 2023-12-13 DIAGNOSIS — Z7409 Other reduced mobility: Secondary | ICD-10-CM | POA: Diagnosis not present

## 2023-12-13 DIAGNOSIS — R262 Difficulty in walking, not elsewhere classified: Secondary | ICD-10-CM | POA: Diagnosis not present

## 2023-12-13 DIAGNOSIS — Z741 Need for assistance with personal care: Secondary | ICD-10-CM | POA: Diagnosis not present

## 2023-12-13 DIAGNOSIS — M6281 Muscle weakness (generalized): Secondary | ICD-10-CM | POA: Diagnosis not present

## 2023-12-13 DIAGNOSIS — R2681 Unsteadiness on feet: Secondary | ICD-10-CM | POA: Diagnosis not present

## 2023-12-14 DIAGNOSIS — M6281 Muscle weakness (generalized): Secondary | ICD-10-CM | POA: Diagnosis not present

## 2023-12-14 DIAGNOSIS — Z741 Need for assistance with personal care: Secondary | ICD-10-CM | POA: Diagnosis not present

## 2023-12-14 DIAGNOSIS — Z7409 Other reduced mobility: Secondary | ICD-10-CM | POA: Diagnosis not present

## 2023-12-14 DIAGNOSIS — R262 Difficulty in walking, not elsewhere classified: Secondary | ICD-10-CM | POA: Diagnosis not present

## 2023-12-14 DIAGNOSIS — R2681 Unsteadiness on feet: Secondary | ICD-10-CM | POA: Diagnosis not present

## 2023-12-15 DIAGNOSIS — R2681 Unsteadiness on feet: Secondary | ICD-10-CM | POA: Diagnosis not present

## 2023-12-15 DIAGNOSIS — R262 Difficulty in walking, not elsewhere classified: Secondary | ICD-10-CM | POA: Diagnosis not present

## 2023-12-15 DIAGNOSIS — M5416 Radiculopathy, lumbar region: Secondary | ICD-10-CM | POA: Diagnosis not present

## 2023-12-15 DIAGNOSIS — Z7409 Other reduced mobility: Secondary | ICD-10-CM | POA: Diagnosis not present

## 2023-12-15 DIAGNOSIS — M6281 Muscle weakness (generalized): Secondary | ICD-10-CM | POA: Diagnosis not present

## 2023-12-15 DIAGNOSIS — Z741 Need for assistance with personal care: Secondary | ICD-10-CM | POA: Diagnosis not present

## 2023-12-15 HISTORY — DX: Radiculopathy, lumbar region: M54.16

## 2023-12-16 DIAGNOSIS — R262 Difficulty in walking, not elsewhere classified: Secondary | ICD-10-CM | POA: Diagnosis not present

## 2023-12-16 DIAGNOSIS — Z7409 Other reduced mobility: Secondary | ICD-10-CM | POA: Diagnosis not present

## 2023-12-16 DIAGNOSIS — R2681 Unsteadiness on feet: Secondary | ICD-10-CM | POA: Diagnosis not present

## 2023-12-16 DIAGNOSIS — M6281 Muscle weakness (generalized): Secondary | ICD-10-CM | POA: Diagnosis not present

## 2023-12-16 DIAGNOSIS — Z741 Need for assistance with personal care: Secondary | ICD-10-CM | POA: Diagnosis not present

## 2023-12-18 DIAGNOSIS — R262 Difficulty in walking, not elsewhere classified: Secondary | ICD-10-CM | POA: Diagnosis not present

## 2023-12-18 DIAGNOSIS — Z7409 Other reduced mobility: Secondary | ICD-10-CM | POA: Diagnosis not present

## 2023-12-18 DIAGNOSIS — R2681 Unsteadiness on feet: Secondary | ICD-10-CM | POA: Diagnosis not present

## 2023-12-18 DIAGNOSIS — M6281 Muscle weakness (generalized): Secondary | ICD-10-CM | POA: Diagnosis not present

## 2023-12-18 DIAGNOSIS — Z741 Need for assistance with personal care: Secondary | ICD-10-CM | POA: Diagnosis not present

## 2023-12-19 DIAGNOSIS — R2681 Unsteadiness on feet: Secondary | ICD-10-CM | POA: Diagnosis not present

## 2023-12-19 DIAGNOSIS — R262 Difficulty in walking, not elsewhere classified: Secondary | ICD-10-CM | POA: Diagnosis not present

## 2023-12-19 DIAGNOSIS — Z741 Need for assistance with personal care: Secondary | ICD-10-CM | POA: Diagnosis not present

## 2023-12-19 DIAGNOSIS — Z7409 Other reduced mobility: Secondary | ICD-10-CM | POA: Diagnosis not present

## 2023-12-19 DIAGNOSIS — C170 Malignant neoplasm of duodenum: Secondary | ICD-10-CM | POA: Diagnosis not present

## 2023-12-19 DIAGNOSIS — M6281 Muscle weakness (generalized): Secondary | ICD-10-CM | POA: Diagnosis not present

## 2023-12-20 DIAGNOSIS — I5032 Chronic diastolic (congestive) heart failure: Secondary | ICD-10-CM | POA: Diagnosis not present

## 2023-12-20 DIAGNOSIS — Z7409 Other reduced mobility: Secondary | ICD-10-CM | POA: Diagnosis not present

## 2023-12-20 DIAGNOSIS — M6281 Muscle weakness (generalized): Secondary | ICD-10-CM | POA: Diagnosis not present

## 2023-12-20 DIAGNOSIS — E039 Hypothyroidism, unspecified: Secondary | ICD-10-CM | POA: Diagnosis not present

## 2023-12-20 DIAGNOSIS — R262 Difficulty in walking, not elsewhere classified: Secondary | ICD-10-CM | POA: Diagnosis not present

## 2023-12-20 DIAGNOSIS — J9811 Atelectasis: Secondary | ICD-10-CM | POA: Diagnosis not present

## 2023-12-20 DIAGNOSIS — I482 Chronic atrial fibrillation, unspecified: Secondary | ICD-10-CM | POA: Diagnosis not present

## 2023-12-20 DIAGNOSIS — C170 Malignant neoplasm of duodenum: Secondary | ICD-10-CM | POA: Diagnosis not present

## 2023-12-20 DIAGNOSIS — R2681 Unsteadiness on feet: Secondary | ICD-10-CM | POA: Diagnosis not present

## 2023-12-20 DIAGNOSIS — E041 Nontoxic single thyroid nodule: Secondary | ICD-10-CM | POA: Diagnosis not present

## 2023-12-20 DIAGNOSIS — Z741 Need for assistance with personal care: Secondary | ICD-10-CM | POA: Diagnosis not present

## 2023-12-20 DIAGNOSIS — J90 Pleural effusion, not elsewhere classified: Secondary | ICD-10-CM | POA: Diagnosis not present

## 2023-12-21 ENCOUNTER — Ambulatory Visit: Admitting: Oncology

## 2023-12-21 DIAGNOSIS — R2681 Unsteadiness on feet: Secondary | ICD-10-CM | POA: Diagnosis not present

## 2023-12-21 DIAGNOSIS — C170 Malignant neoplasm of duodenum: Secondary | ICD-10-CM | POA: Diagnosis not present

## 2023-12-21 DIAGNOSIS — Z7409 Other reduced mobility: Secondary | ICD-10-CM | POA: Diagnosis not present

## 2023-12-21 DIAGNOSIS — M6281 Muscle weakness (generalized): Secondary | ICD-10-CM | POA: Diagnosis not present

## 2023-12-21 DIAGNOSIS — Z741 Need for assistance with personal care: Secondary | ICD-10-CM | POA: Diagnosis not present

## 2023-12-21 DIAGNOSIS — R262 Difficulty in walking, not elsewhere classified: Secondary | ICD-10-CM | POA: Diagnosis not present

## 2023-12-21 NOTE — Progress Notes (Unsigned)
 [facility]  [address] [place],  [place]  [address] [phone]  Clinic Day:  12/22/2023  Referring physician: Iven [name] DASEN, PA-C   HISTORY OF PRESENT ILLNESS:  The patient is a 78 y.o. female who I recently began seeing for newly diagnosed duodenal adenocarcinoma.  She comes in today to go over her PET scan images to determine if there is any occult evidence of disease metastasis.  She also comes in today to go over her guardant testing to determine if her disease harbors a mutation/alteration for which some form of targeted therapy could be applied.  Since her last visit, the patient claims to be feeling better.  She claims to be walking more from the rehabilitation she has been receiving at her local nursing facility.  She still holds her side hope that some type of treatment can be given to eradicate her duodenal adenocarcinoma.  PHYSICAL EXAM:  Blood pressure (!) 122/58, pulse 86, temperature 97.8 F (36.6 C), temperature source Oral, resp. rate 14, height 5' (1.524 m), SpO2 93%. Wt Readings from Last 3 Encounters:  11/27/23 185 lb (83.9 kg)  10/22/23 191 lb 5.8 oz (86.8 kg)  09/21/23 200 lb (90.7 kg)   Body mass index is 36.13 kg/m. Performance status (ECOG): 2 - Symptomatic, <50% confined to bed Physical Exam Constitutional:      Appearance: Normal appearance. She is obese. She is not ill-appearing.     Comments: She has a chronically ill appearance.  She is in a wheelchair and is wearing oxygen per nasal cannula.  HENT:     Mouth/Throat:     Mouth: Mucous membranes are moist.     Pharynx: Oropharynx is clear. No oropharyngeal exudate or posterior oropharyngeal erythema.  Cardiovascular:     Rate and Rhythm: Normal rate and regular rhythm.     Heart sounds: No murmur heard.    No friction rub. No gallop.  Pulmonary:     Effort: Pulmonary effort is normal. No respiratory distress.     Breath sounds: Decreased air movement  present. Decreased breath sounds present. No wheezing, rhonchi or rales.  Abdominal:     General: Bowel sounds are normal. There is no distension.     Palpations: Abdomen is soft. There is no mass.     Tenderness: There is no abdominal tenderness.  Musculoskeletal:        General: No swelling.     Right lower leg: No edema.     Left lower leg: No edema.  Lymphadenopathy:     Cervical: No cervical adenopathy.     Upper Body:     Right upper body: No supraclavicular or axillary adenopathy.     Left upper body: No supraclavicular or axillary adenopathy.     Lower Body: No right inguinal adenopathy. No left inguinal adenopathy.  Skin:    General: Skin is warm.     Coloration: Skin is not jaundiced.     Findings: No lesion or rash.  Neurological:     General: No focal deficit present.     Mental Status: She is alert and oriented to person, place, and time. Mental status is at baseline.  Psychiatric:        Mood and Affect: Mood normal.        Behavior: Behavior normal.        Thought Content: Thought content normal.   SCANS: Her PET scan done yesterday revealed the following: LABS:      Latest  Ref Rng & Units 11/17/2023    2:34 PM 10/25/2023    6:29 AM 10/24/2023    6:41 PM  CBC  WBC 4.0 - 10.5 K/uL 8.3  6.5  8.8   Hemoglobin 12.0 - 15.0 g/dL 7.3  8.1  8.0   Hematocrit 36.0 - 46.0 % 24.7  25.9  25.8   Platelets 150 - 400 K/uL 199  118  98       Latest Ref Rng & Units 11/17/2023    2:34 PM 10/25/2023    6:29 AM 10/24/2023    6:36 AM  CMP  Glucose 70 - 99 mg/dL 97  92  94   BUN 8 - 23 mg/dL 40  28  23   Creatinine 0.44 - 1.00 mg/dL 8.74  8.68  8.73   Sodium 135 - 145 mmol/L 135  133  132   Potassium 3.5 - 5.1 mmol/L 3.8  2.9  3.5   Chloride 98 - 111 mmol/L 98  95  99   CO2 22 - 32 mmol/L 25  26  25    Calcium  8.9 - 10.3 mg/dL 9.0  8.0  7.7   Total Protein 6.5 - 8.1 g/dL 6.2     Total Bilirubin 0.0 - 1.2 mg/dL 0.3     Alkaline Phos 38 - 126 U/L 205     AST 15 - 41 U/L 50      ALT 0 - 44 U/L 28      Lab Results  Component Value Date   CEA 4.18 11/17/2023     Latest Reference Range & Units 11/21/23 12:07  Iron 28 - 170 ug/dL 30  UIBC ug/dL 692  TIBC 749 - 549 ug/dL 662  Saturation Ratios 10.4 - 31.8 % 9 (L)  Ferritin 11 - 307 ng/mL 65  (L): Data is abnormally low  PATHOLOGY:  Her duodenal mass biopsy in April 2025 revealed the following: Diagnosis   A: Duodenum, mass, biopsy - Moderate to poorly differentiated adenocarcinoma with mucinous differentiation; see comment   B: Duodenum, mass, biopsy - Predominantly duodenal adenoma (tubular adenoma) with rare focus of high-grade dysplasia and detached small fragments of mucinous adenocarcinoma    ASSESSMENT & PLAN:  A 78 y.o. female who I was asked to consult upon for duodenal adenocarcinoma.  To date, there has been no definitive evidence of her having metastatic disease.  However, to rule this out, I will have this patient undergo a PET scan within the forthcoming weeks.  Ideally, I would like to give this patient some form of systemic therapy to see if it could reduce her disease burden to where there would be potential for surgical resection.  However, this patient has very suboptimal health.  I am very concerned with how well she could tolerate systemic chemotherapy.  Based upon this, I will have Guardant testing done on both her blood and tumor to see if her disease may be sensitive to other agents, such as immunotherapy.  That would be the ideal situation, as immunotherapy could be used, which would not significantly impact her daily quality of life.  I will tentatively see this patient back in 3 weeks to go over both her PET scan images, Guardant test results, and their implications.  Of note, as her hemoglobin is below 8 today, I will arrange for her to be transfused 2 units of blood next week.  The patient and her family understand all the plans discussed today and are in agreement with them.  I  do  appreciate Rothfuss, Jacob T, PA-C for his new consult.   Sheba Whaling DELENA Kerns, MD

## 2023-12-22 ENCOUNTER — Ambulatory Visit: Admitting: Oncology

## 2023-12-22 ENCOUNTER — Inpatient Hospital Stay: Attending: Oncology | Admitting: Oncology

## 2023-12-22 ENCOUNTER — Telehealth: Payer: Self-pay | Admitting: Oncology

## 2023-12-22 ENCOUNTER — Other Ambulatory Visit: Payer: Self-pay | Admitting: Oncology

## 2023-12-22 VITALS — BP 122/58 | HR 86 | Temp 97.8°F | Resp 14 | Ht 60.0 in

## 2023-12-22 DIAGNOSIS — E039 Hypothyroidism, unspecified: Secondary | ICD-10-CM | POA: Diagnosis not present

## 2023-12-22 DIAGNOSIS — C170 Malignant neoplasm of duodenum: Secondary | ICD-10-CM | POA: Insufficient documentation

## 2023-12-22 DIAGNOSIS — I482 Chronic atrial fibrillation, unspecified: Secondary | ICD-10-CM | POA: Diagnosis not present

## 2023-12-22 DIAGNOSIS — I5032 Chronic diastolic (congestive) heart failure: Secondary | ICD-10-CM | POA: Diagnosis not present

## 2023-12-22 NOTE — Telephone Encounter (Signed)
 Patient has been scheduled for follow-up visit per 12/20/23 LOS.  LVM notifying pt of appt details, provided my direct number to pt if appt changes need to be made.

## 2023-12-24 DIAGNOSIS — R2681 Unsteadiness on feet: Secondary | ICD-10-CM | POA: Diagnosis not present

## 2023-12-24 DIAGNOSIS — M6281 Muscle weakness (generalized): Secondary | ICD-10-CM | POA: Diagnosis not present

## 2023-12-24 DIAGNOSIS — Z7409 Other reduced mobility: Secondary | ICD-10-CM | POA: Diagnosis not present

## 2023-12-24 DIAGNOSIS — Z741 Need for assistance with personal care: Secondary | ICD-10-CM | POA: Diagnosis not present

## 2023-12-24 DIAGNOSIS — R262 Difficulty in walking, not elsewhere classified: Secondary | ICD-10-CM | POA: Diagnosis not present

## 2023-12-25 ENCOUNTER — Other Ambulatory Visit: Payer: Self-pay

## 2023-12-27 ENCOUNTER — Other Ambulatory Visit: Payer: Self-pay

## 2023-12-27 ENCOUNTER — Emergency Department (HOSPITAL_COMMUNITY)

## 2023-12-27 ENCOUNTER — Emergency Department (HOSPITAL_COMMUNITY)
Admission: EM | Admit: 2023-12-27 | Discharge: 2023-12-29 | Disposition: A | Discharge status: Home / Self Care | Attending: Emergency Medicine | Admitting: Emergency Medicine

## 2023-12-27 ENCOUNTER — Encounter (HOSPITAL_COMMUNITY): Payer: Self-pay

## 2023-12-27 DIAGNOSIS — R531 Weakness: Secondary | ICD-10-CM | POA: Diagnosis not present

## 2023-12-27 DIAGNOSIS — M25561 Pain in right knee: Secondary | ICD-10-CM | POA: Insufficient documentation

## 2023-12-27 DIAGNOSIS — Z7409 Other reduced mobility: Secondary | ICD-10-CM | POA: Diagnosis not present

## 2023-12-27 DIAGNOSIS — W19XXXA Unspecified fall, initial encounter: Secondary | ICD-10-CM | POA: Insufficient documentation

## 2023-12-27 DIAGNOSIS — M19011 Primary osteoarthritis, right shoulder: Secondary | ICD-10-CM | POA: Diagnosis not present

## 2023-12-27 DIAGNOSIS — M25519 Pain in unspecified shoulder: Secondary | ICD-10-CM | POA: Diagnosis not present

## 2023-12-27 DIAGNOSIS — M25511 Pain in right shoulder: Secondary | ICD-10-CM | POA: Diagnosis not present

## 2023-12-27 DIAGNOSIS — S4991XA Unspecified injury of right shoulder and upper arm, initial encounter: Secondary | ICD-10-CM | POA: Diagnosis present

## 2023-12-27 DIAGNOSIS — S43401A Unspecified sprain of right shoulder joint, initial encounter: Secondary | ICD-10-CM | POA: Diagnosis not present

## 2023-12-27 DIAGNOSIS — Y92129 Unspecified place in nursing home as the place of occurrence of the external cause: Secondary | ICD-10-CM | POA: Insufficient documentation

## 2023-12-27 DIAGNOSIS — Z7901 Long term (current) use of anticoagulants: Secondary | ICD-10-CM | POA: Diagnosis not present

## 2023-12-27 DIAGNOSIS — R609 Edema, unspecified: Secondary | ICD-10-CM | POA: Diagnosis not present

## 2023-12-27 DIAGNOSIS — R6889 Other general symptoms and signs: Secondary | ICD-10-CM | POA: Diagnosis not present

## 2023-12-27 MED ORDER — HYDROCODONE-ACETAMINOPHEN 5-325 MG PO TABS
1.0000 | ORAL_TABLET | Freq: Once | ORAL | Status: AC
  Administered 2023-12-27: 1 via ORAL
  Filled 2023-12-27: qty 1

## 2023-12-27 MED ORDER — DILTIAZEM HCL ER COATED BEADS 120 MG PO CP24
120.0000 mg | ORAL_CAPSULE | Freq: Every day | ORAL | Status: DC
  Administered 2023-12-27 – 2023-12-29 (×3): 120 mg via ORAL
  Filled 2023-12-27 (×3): qty 1

## 2023-12-27 MED ORDER — TORSEMIDE 20 MG PO TABS: 20.0000 mg | ORAL_TABLET | ORAL | Status: DC

## 2023-12-27 MED ORDER — METHOCARBAMOL 500 MG PO TABS
500.0000 mg | ORAL_TABLET | Freq: Four times a day (QID) | ORAL | Status: DC | PRN
  Administered 2023-12-27 – 2023-12-28 (×4): 500 mg via ORAL
  Filled 2023-12-27 (×4): qty 1

## 2023-12-27 MED ORDER — BUSPIRONE HCL 10 MG PO TABS
10.0000 mg | ORAL_TABLET | Freq: Two times a day (BID) | ORAL | Status: DC
  Administered 2023-12-27 – 2023-12-29 (×4): 10 mg via ORAL
  Filled 2023-12-27 (×4): qty 1

## 2023-12-27 MED ORDER — POTASSIUM CHLORIDE CRYS ER 20 MEQ PO TBCR
20.0000 meq | EXTENDED_RELEASE_TABLET | Freq: Two times a day (BID) | ORAL | Status: DC
  Administered 2023-12-27 – 2023-12-29 (×4): 20 meq via ORAL
  Filled 2023-12-27 (×4): qty 1

## 2023-12-27 MED ORDER — ONDANSETRON HCL 4 MG PO TABS: 4.0000 mg | ORAL_TABLET | Freq: Four times a day (QID) | ORAL | Status: DC | PRN

## 2023-12-27 MED ORDER — APIXABAN 5 MG PO TABS
5.0000 mg | ORAL_TABLET | Freq: Two times a day (BID) | ORAL | Status: DC
  Administered 2023-12-27 – 2023-12-29 (×4): 5 mg via ORAL
  Filled 2023-12-27: qty 2
  Filled 2023-12-27 (×3): qty 1

## 2023-12-27 MED ORDER — PANTOPRAZOLE SODIUM 40 MG PO TBEC
40.0000 mg | DELAYED_RELEASE_TABLET | Freq: Two times a day (BID) | ORAL | Status: DC
  Administered 2023-12-28 – 2023-12-29 (×3): 40 mg via ORAL
  Filled 2023-12-27 (×3): qty 1

## 2023-12-27 MED ORDER — IPRATROPIUM-ALBUTEROL 0.5-2.5 (3) MG/3ML IN SOLN: 3.0000 mL | Freq: Four times a day (QID) | RESPIRATORY_TRACT | Status: DC | PRN

## 2023-12-27 MED ORDER — ATORVASTATIN CALCIUM 10 MG PO TABS
20.0000 mg | ORAL_TABLET | Freq: Every day | ORAL | Status: DC
  Administered 2023-12-27 – 2023-12-28 (×2): 20 mg via ORAL
  Filled 2023-12-27 (×2): qty 2

## 2023-12-27 MED ORDER — MELATONIN 3 MG PO TABS
3.0000 mg | ORAL_TABLET | Freq: Every evening | ORAL | Status: DC | PRN
  Administered 2023-12-28: 3 mg via ORAL
  Filled 2023-12-27: qty 1

## 2023-12-27 MED ORDER — AMIODARONE HCL 200 MG PO TABS
200.0000 mg | ORAL_TABLET | Freq: Every day | ORAL | Status: DC
  Administered 2023-12-27 – 2023-12-29 (×3): 200 mg via ORAL
  Filled 2023-12-27 (×3): qty 1

## 2023-12-27 MED ORDER — FERROUS SULFATE 325 (65 FE) MG PO TABS
325.0000 mg | ORAL_TABLET | Freq: Every day | ORAL | Status: DC
  Administered 2023-12-28 – 2023-12-29 (×2): 325 mg via ORAL
  Filled 2023-12-27 (×2): qty 1

## 2023-12-27 MED ORDER — LEVOTHYROXINE SODIUM 100 MCG PO TABS
100.0000 ug | ORAL_TABLET | Freq: Every day | ORAL | Status: DC
  Administered 2023-12-28 – 2023-12-29 (×2): 100 ug via ORAL
  Filled 2023-12-27 (×2): qty 1

## 2023-12-27 MED ORDER — DULOXETINE HCL 20 MG PO CPEP
20.0000 mg | ORAL_CAPSULE | Freq: Every morning | ORAL | Status: DC
  Administered 2023-12-28 – 2023-12-29 (×2): 20 mg via ORAL
  Filled 2023-12-27 (×2): qty 1

## 2023-12-27 MED ORDER — ALLOPURINOL 100 MG PO TABS
300.0000 mg | ORAL_TABLET | Freq: Every day | ORAL | Status: DC
  Administered 2023-12-27 – 2023-12-29 (×3): 300 mg via ORAL
  Filled 2023-12-27 (×3): qty 3

## 2023-12-27 NOTE — ED Notes (Signed)
 Pt.soiled brief with urine. Pt. Cleaned up with washcloths and dryed. Pt.new brief changed with 2 assist.

## 2023-12-27 NOTE — ED Notes (Signed)
 PT at bedside.

## 2023-12-27 NOTE — ED Notes (Signed)
 Pt requesting her daughter [name] to be contacted and requested she come sit with pt. Daughter [name] contacted at [phone] and states she will come as soon as possible.

## 2023-12-27 NOTE — ED Provider Notes (Signed)
 [place] EMERGENCY DEPARTMENT AT [facility] Provider Note   CSN: [id number] Arrival date & time: 12/27/23  1201     Patient presents with: Shoulder Pain   Peggy Armstrong is a 78 y.o. female.   Patient to ED complains of right shoulder and right knee pain. She reports history of arthritis. No new falls or injury. She feels she strained her right shoulder when she used her bedside device to help herself get up from bed last night, but no other injury. She is on Eliquis . Recently admitted after fall with left shoulder surgery requiring surgery and discharged to a SNF for rehab. Per daughter, she just left the rehab center 2 days ago.   The history is provided by the patient. No language interpreter was used.  Shoulder Pain      Prior to Admission medications   Medication Sig Start Date End Date Taking? Authorizing Provider  acetaminophen  (TYLENOL ) 325 MG tablet Take 650 mg by mouth every 6 (six) hours as needed for mild pain (pain score 1-3) or moderate pain (pain score 4-6).   Yes [provider]  allopurinol  (ZYLOPRIM ) 300 MG tablet Take 1 tablet (300 mg total) by mouth daily. 07/18/23  Yes Rothfuss, Jacob T, PA-C  Amino Acids-Protein Hydrolys (PRO-STAT PO) Take 30 mLs by mouth in the morning.   Yes [provider]  amiodarone  (PACERONE ) 200 MG tablet Take 1 tablet (200 mg total) by mouth daily. 09/01/23  Yes Croitoru, Mihai, MD  Ascorbic Acid (VITAMIN C CR) 500 MG TBCR Take 500 mg by mouth daily.   Yes [provider]  atorvastatin  (LIPITOR) 20 MG tablet Take 1 tablet (20 mg total) by mouth daily. Patient taking differently: Take 20 mg by mouth at bedtime. 12/19/22  Yes Vannie Reche RAMAN, NP  busPIRone  (BUSPAR ) 10 MG tablet TAKE 1 TABLET BY MOUTH TWICE A DAY 08/22/23  Yes Rothfuss, Jacob T, PA-C  Calcium  Carb-Cholecalciferol  (CALCIUM  600 + D PO) Take 1 tablet by mouth in the morning.   Yes [provider]  cyanocobalamin  (VITAMIN B12)  1000 MCG tablet Take 1,000 mcg by mouth daily.   Yes [provider]  diltiazem  (CARDIZEM  CD) 120 MG 24 hr capsule Take 120 mg by mouth daily.   Yes [provider]  DULoxetine  (CYMBALTA ) 20 MG capsule TAKE 1 CAPSULE BY MOUTH EVERY DAY Patient taking differently: Take 20 mg by mouth in the morning. 10/23/23  Yes Rothfuss, Jacob T, PA-C  ELIQUIS  5 MG TABS tablet Take 1 tablet (5 mg total) by mouth 2 (two) times daily. 10/29/23  Yes Vernon Ranks, MD  ferrous sulfate  325 (65 FE) MG tablet Take 325 mg by mouth in the morning and at bedtime.   Yes [provider]  folic acid  (FOLVITE ) 1 MG tablet Take 1 tablet (1 mg total) by mouth daily. 05/18/23  Yes Verdene Purchase, MD  ipratropium-albuterol  (DUONEB) 0.5-2.5 (3) MG/3ML SOLN Take 3 mLs by nebulization every 6 (six) hours as needed (for shortness of breath or wheezing).   Yes [provider]  KLOR-CON  M20 20 MEQ tablet Take 20 mEq by mouth in the morning and at bedtime.   Yes [provider]  levothyroxine  (SYNTHROID ) 100 MCG tablet Take 1 tablet (100 mcg total) by mouth daily before breakfast. 07/18/23  Yes Rothfuss, Jacob T, PA-C  melatonin 3 MG TABS tablet Take 1 tablet (3 mg total) by mouth at bedtime as needed (insomnia). 05/18/23  Yes Verdene Purchase, MD  methocarbamol  (ROBAXIN )  500 MG tablet TAKE 1 TABLET BY MOUTH EVERY 6 HOURS AS NEEDED FOR MUSCLE SPASMS. 10/03/23  Yes Rothfuss, Jacob T, PA-C  ondansetron  (ZOFRAN ) 4 MG tablet Take 4 mg by mouth every 6 (six) hours as needed for nausea or vomiting.   Yes [provider]  pantoprazole  (PROTONIX ) 40 MG tablet Take 1 tablet (40 mg total) by mouth daily. Patient taking differently: Take 40 mg by mouth 2 (two) times daily before a meal. 07/18/23  Yes Rothfuss, Jacob T, PA-C  torsemide  (DEMADEX ) 20 MG tablet Take Torsemide  on alternating days40 mg one day (2 tablets) then the next day 20 mg (1 tablet). Patient taking differently: Take 20-40 mg by mouth See  admin instructions. Take 20 mg by mouth EVERY OTHER MORNING- alternating with 40 mg 09/21/23  Yes Madireddy, Alean SAUNDERS, MD    Allergies: Codeine and Tape    Review of Systems  Updated Vital Signs BP (!) 147/113 (BP Location: Left Arm)   Pulse 83   Temp 98.3 F (36.8 C) (Oral)   Resp 15   Wt 84.4 kg   SpO2 91%   BMI 36.33 kg/m   Physical Exam Vitals and nursing note reviewed.  Constitutional:      Appearance: She is well-developed.  HENT:     Head: Normocephalic.  Cardiovascular:     Rate and Rhythm: Normal rate and regular rhythm.     Pulses: Normal pulses.     Heart sounds: No murmur heard. Pulmonary:     Effort: Pulmonary effort is normal.     Breath sounds: Normal breath sounds. No wheezing, rhonchi or rales.  Abdominal:     General: Bowel sounds are normal.     Palpations: Abdomen is soft.     Tenderness: There is no abdominal tenderness. There is no guarding or rebound.  Musculoskeletal:     Cervical back: Normal range of motion and neck supple.     Comments: Right shoulder with ROM limited by discomfort. No bony deformity. The right knee is tender to lateral aspect. No bony deformity. The joint seems stable. Exam limited by body habitus.   Skin:    General: Skin is warm and dry.     Findings: Bruising (Widespread bruising over extremities. Abrasions in various stages of healing.) present.  Neurological:     General: No focal deficit present.     Mental Status: She is alert and oriented to person, place, and time.     (all labs ordered are listed, but only abnormal results are displayed) Labs Reviewed - No data to display  EKG: None  Radiology: DG Knee Complete 4 Views Right Result Date: 12/27/2023 CLINICAL DATA:  Pain. EXAM: RIGHT KNEE - COMPLETE 4+ VIEW COMPARISON:  None Available. FINDINGS: Diffuse osseous demineralization. No acute fracture or dislocation. Suspected small joint effusion. Lateral femorotibial compartment moderate to severe joint space  narrowing with osteophytosis. Soft tissues are unremarkable. IMPRESSION: 1. No acute osseous abnormality. 2. Moderate to severe lateral femorotibial compartment joint space narrowing. 3. Suspected small joint effusion. Electronically Signed   By: Harrietta Sherry M.D.   On: 12/27/2023 15:52   DG Shoulder Right Result Date: 12/27/2023 CLINICAL DATA:  Pain. EXAM: RIGHT SHOULDER - 2+ VIEW COMPARISON:  PET-CT dated 12/20/2023. FINDINGS: No acute fracture or dislocation. Mild acromioclavicular and glenohumeral osteoarthritis. Remote right-sided rib fractures. Soft tissues are unremarkable. IMPRESSION: 1. No acute osseous abnormality. 2. Mild acromioclavicular and glenohumeral osteoarthritis. Electronically Signed   By: Harrietta Sherry M.D.   On:  12/27/2023 15:49     Procedures   Medications Ordered in the ED  allopurinol  (ZYLOPRIM ) tablet 300 mg (has no administration in time range)  amiodarone  (PACERONE ) tablet 200 mg (has no administration in time range)  atorvastatin  (LIPITOR) tablet 20 mg (has no administration in time range)  busPIRone  (BUSPAR ) tablet 10 mg (has no administration in time range)  diltiazem  (CARDIZEM  CD) 24 hr capsule 120 mg (has no administration in time range)  DULoxetine  (CYMBALTA ) DR capsule 20 mg (has no administration in time range)  apixaban  (ELIQUIS ) tablet 5 mg (has no administration in time range)  ferrous sulfate  tablet 325 mg (has no administration in time range)  ipratropium-albuterol  (DUONEB) 0.5-2.5 (3) MG/3ML nebulizer solution 3 mL (has no administration in time range)  potassium chloride  SA (KLOR-CON  M) CR tablet 20 mEq (has no administration in time range)  levothyroxine  (SYNTHROID ) tablet 100 mcg (has no administration in time range)  melatonin tablet 3 mg (has no administration in time range)  methocarbamol  (ROBAXIN ) tablet 500 mg (has no administration in time range)  ondansetron  (ZOFRAN ) tablet 4 mg (has no administration in time range)  pantoprazole   (PROTONIX ) EC tablet 40 mg (has no administration in time range)  torsemide  (DEMADEX ) tablet 20-40 mg (has no administration in time range)  HYDROcodone -acetaminophen  (NORCO/VICODIN) 5-325 MG per tablet 1 tablet (1 tablet Oral Given 12/27/23 1340)  HYDROcodone -acetaminophen  (NORCO/VICODIN) 5-325 MG per tablet 1 tablet (1 tablet Oral Given 12/27/23 1816)    Clinical Course as of 12/27/23 1957  Wed Dec 27, 2023  1642 Daughter at bedside. Now informs me her caregiver at home is the patient's other daughter who is being treated for cancer and is too weak to care for her mother. The patient signed herself out of the nursing home she was recently admitted to in favor of going home. TOC and PT ordered for further evaluation.  [SU]  [id number] Per PT evaluation, the patient is recommended for SNF place due to poor mobility. Social work/case mgmt involved in decision making.  [SU]  1909 Discussed patient status with social work who advises she will need to board in the ED until a solution can be found for her situation.  [SU]  1954 Per SW, the patient will be returning to [facility] where she just recently left. The family has worked out a Insurance claims handler and the patient has been approved to return. SW processing the paperwork. She will board in the ED until able to transfer. Home medications ordered. On final recheck, the patient is sleeping. VSS [SU]    Clinical Course User Index [SU] Odell Balls, PA-C                                 Medical Decision Making Amount and/or Complexity of Data Reviewed Radiology: ordered.  Risk OTC drugs. Prescription drug management.        Final diagnoses:  Sprain of right shoulder, unspecified shoulder sprain type, initial encounter  Acute pain of right knee  Declining mobility    ED Discharge Orders     None          Odell Balls, DEVONNA 12/27/23 LEANOR    Levander Houston, MD 01/04/24 [phone]

## 2023-12-27 NOTE — Care Management (Signed)
 ED RNCM consulted,  concerning SNF placement. Reviewed the chart  and spoke with daughter Tavi Gaughran [phone]. Daughter reports that patient was discharged from [facility] on Sun 6/28. Patient lives with her daughter who is undergoing chemotherapy, she states she is unable to care for her mom at this time. As per daughter patient has had two falls since discharge.  Daughter also reports that she believes HH was set up upon discharge, but no one has contacted her as of yet. PT eval pending, SW was updated.

## 2023-12-27 NOTE — Evaluation (Signed)
 Physical Therapy Evaluation Patient Details Name: Peggy Armstrong MRN: [id number] DOB: 1946/03/14 Today's Date: 12/27/2023  History of Present Illness  78 yo female presenting to ED on 12/27/2023 due to R UE and LE pain which pt reports she has had all of her life, now presents with 10/10 pain with imaging rule out acute findings. Per daughter whom is a bedside pt slid OOB this am and had just return home with other daughter following SNF stay since April.  On 4/26 pt hospitalized after mechanical fall, fell to L side resulting in L UE and LE fx, per pt and daughter report L UE and LE WBAT.  Pt PMH includes but is not limited to:  HFpEF, paroxysmal A fib on Eliquis , AAA, essential HTN, recurrent GIB, RA, CKD IIIA, falls, L humeral fx, L elbow avulsion fx, L greater trochanter fx, L2 fx, depression, anxiety, hypothyroidism, DVT, HLD, and duodenal adenocarcinoma.  Clinical Impression      Pt admitted with above diagnosis.  Pt currently with functional limitations due to the deficits listed below (see PT Problem List). Pt in bed when PT arrived, pt on R side reporting R UE and LE pain 10/10 attributed to arthritis. [name] whom resides 1 hr away at bedside. Daughter indicated her sister whom pt lives with is unable to care for pt at this time with pt having been at home for 2 days following SNF placement. Pt required total A for supine <> sit, total A for attempts to obtain upright standing and total A x 3 to scoot posteriorly on bed. Pt limited in participation due to pain. Patient will benefit from continued inpatient follow up therapy, <3 hours/day. Pt will benefit from acute skilled PT to increase their independence and safety with mobility to allow discharge.       If plan is discharge home, recommend the following: Two people to help with walking and/or transfers;Two people to help with bathing/dressing/bathroom;Assistance with cooking/housework;Assist for transportation;Help with stairs or  ramp for entrance   Can travel by private vehicle   No    Equipment Recommendations None recommended by PT  Recommendations for Other Services       Functional Status Assessment Patient has had a recent decline in their functional status and demonstrates the ability to make significant improvements in function in a reasonable and predictable amount of time.     Precautions / Restrictions Precautions Precautions: Fall Restrictions Weight Bearing Restrictions Per Provider Order: No      Mobility  Bed Mobility Overal bed mobility: Needs Assistance Bed Mobility: Supine to Sit, Sit to Supine, Rolling Rolling: Total assist   Supine to sit: Total assist, HOB elevated Sit to supine: Total assist   General bed mobility comments: due to pain pt unable to participate with bed mobility tasks, once seated EOB pt able to place B UE on EOB and B LE on floor with no apparent change in pain behavior or report    Transfers Overall transfer level: Needs assistance Equipment used: None Transfers: Sit to/from Stand Sit to Stand: Total assist, From elevated surface           General transfer comment: PT unable to assist pt to full upright standing from elevated stretcher with bear hug technique pt reported she was falling and exhibited strong posterior lean requring therapist to block B LE due to anterior motion of buttock on edge of stretcher, pt unable to scoot posteriorly on stretcher with PT requesting nursing assist to safely manage pt  to sitting position on EOB, total A x 3 with use of bed sheet to assist pt scooting posteriorly on bed.    Ambulation/Gait               General Gait Details: NT  Stairs            Wheelchair Mobility     Tilt Bed    Modified Rankin (Stroke Patients Only)       Balance Overall balance assessment: Needs assistance, History of Falls Sitting-balance support: Feet supported, Bilateral upper extremity supported Sitting  balance-Leahy Scale: Poor       Standing balance-Leahy Scale: Zero                               Pertinent Vitals/Pain Pain Assessment Pain Assessment: 0-10 Pain Score: 10-Worst pain ever Pain Location: R UE and LE Pain Descriptors / Indicators: Aching, Grimacing, Guarding, Moaning, Restless Pain Intervention(s): Limited activity within patient's tolerance, Monitored during session, Patient requesting pain meds-RN notified, Repositioned    Home Living Family/patient expects to be discharged to:: Private residence Living Arrangements: Other (Comment) Available Help at Discharge: Family;Available PRN/intermittently Type of Home: Apartment Home Access: Stairs to enter Entrance Stairs-Rails: Right;Left Entrance Stairs-Number of Steps: flight   Home Layout: One level Home Equipment: Grab bars - tub/shower;Rolling Walker (2 wheels);Cane - single point;Tub bench;BSC/3in1;Wheelchair - manual Additional Comments: daughter whom pt resides with is unable to safety care for pt in home setting    Prior Function Prior Level of Function : Needs assist             Mobility Comments: pt reports unable to ambulate in the 2 days she has been home from SNF, prior to SNF d/c pt states she was able to walk 50 feet x 2 with RW and CGA ADLs Comments: pt requires A for all ADLs, self care tasks and IADLs.     Extremity/Trunk Assessment        Lower Extremity Assessment Lower Extremity Assessment: Generalized weakness       Communication   Communication Communication: No apparent difficulties    Cognition Arousal: Alert Behavior During Therapy: WFL for tasks assessed/performed   PT - Cognitive impairments: No apparent impairments                         Following commands: Intact       Cueing       General Comments      Exercises     Assessment/Plan    PT Assessment Patient needs continued PT services  PT Problem List Decreased  strength;Decreased range of motion;Decreased activity tolerance;Decreased balance;Decreased mobility;Decreased coordination;Pain       PT Treatment Interventions DME instruction;Gait training;Functional mobility training;Therapeutic activities;Therapeutic exercise;Balance training;Neuromuscular re-education;Patient/family education    PT Goals (Current goals can be found in the Care Plan section)  Acute Rehab PT Goals Patient Stated Goal: to go back to rehab PT Goal Formulation: With patient Time For Goal Achievement: 01/10/24 Potential to Achieve Goals: Good    Frequency Min 2X/week     Co-evaluation               AM-PAC PT 6 Clicks Mobility  Outcome Measure Help needed turning from your back to your side while in a flat bed without using bedrails?: Total Help needed moving from lying on your back to sitting on the side of a flat bed without  using bedrails?: Total Help needed moving to and from a bed to a chair (including a wheelchair)?: Total Help needed standing up from a chair using your arms (e.g., wheelchair or bedside chair)?: Total Help needed to walk in hospital room?: Total Help needed climbing 3-5 steps with a railing? : Total 6 Click Score: 6    End of Session Equipment Utilized During Treatment: Gait belt Activity Tolerance: Patient limited by pain;Patient limited by fatigue Patient left: in bed;with family/visitor present (in ED hallway) Nurse Communication: Mobility status;Patient requests pain meds PT Visit Diagnosis: Unsteadiness on feet (R26.81);Other abnormalities of gait and mobility (R26.89);Repeated falls (R29.6);Muscle weakness (generalized) (M62.81);Difficulty in walking, not elsewhere classified (R26.2);Pain Pain - Right/Left: Right Pain - part of body: Leg;Knee;Hip;Arm;Shoulder    Time: 1726-1740 PT Time Calculation (min) (ACUTE ONLY): 14 min   Charges:   PT Evaluation $PT Eval Low Complexity: 1 Low   PT General Charges $$ ACUTE PT  VISIT: 1 Visit         [place], PT Acute Rehab   [place] VEAR Drone 12/27/2023, 6:03 PM

## 2023-12-27 NOTE — ED Notes (Signed)
 Pt. Soiled brief with urine.pt. cleaned up and new brief changed with 2 assist.

## 2023-12-27 NOTE — ED Triage Notes (Signed)
 BIB EMS/ c/o right shoulder pain/ 5/10 pain/ recently d/c from rehab after fall and injuring left shoulder/ pt lives with daughter/ 182/96/ 84HR/ 94%RA./20 RR/ A&Ox4/ pt took pain meds and muscle relaxer PTA

## 2023-12-27 NOTE — TOC Transition Note (Addendum)
 Transition of Care [facility]) - Discharge Note   Patient Details  Name: Peggy Armstrong MRN: [id number] Date of Birth: 1945-10-16  [facility] CM/SW Contact:  Theora Vankirk B Mikka Kissner, LCSWA Phone Number: 12/27/2023, 7:04 PM   Clinical Narrative:     CSW spoke with pt's daughter [name] ([phone]) by phone, she states pt lives with her other daughter [name] who is currently going through cancer and receiving chemo, she states [name] is unable to care for pt at this time due to weakness from the cancer, [name] does not live in the city, she states she came to the hospital because [name] is receiving chemo today. Pt recently was dc from [facility] for STR for about two months and dc recently after feeling like she was strong enough to return home. Pt states she would like to return to [facility] for additional rehab and spoke with admissions yesterday, was told she is in copay status and has already set up a payment plan for the balance that is owed. CSW explained this information would have to be verified before pt could go back, [name] verbalized understanding. CSW will work pt up.           Patient Goals and CMS Choice            Discharge Placement                       Discharge Plan and Services Additional resources added to the After Visit Summary for                                       Social Drivers of Health (SDOH) Interventions SDOH Screenings   Food Insecurity: No Food Insecurity (11/17/2023)  Housing: Low Risk  (11/17/2023)  Transportation Needs: No Transportation Needs (11/17/2023)  Utilities: Not At Risk (11/17/2023)  Depression (PHQ2-9): Low Risk  (12/22/2023)  Financial Resource Strain: Low Risk  (12/02/2022)   Received from [facility]  Physical Activity: Unknown (04/05/2022)   Received from [facility]  Social Connections: Moderately Isolated (10/22/2023)  Stress: No Stress Concern Present (04/05/2022)   Received from [facility]  Tobacco Use:  Medium Risk (12/27/2023)     Readmission Risk Interventions    07/24/2023    5:23 PM 05/14/2023    6:44 PM  Readmission Risk Prevention Plan  Transportation Screening Complete Complete  PCP or Specialist Appt within 3-5 Days Complete Complete  HRI or Home Care Consult Complete Complete  Social Work Consult for Recovery Care Planning/Counseling Complete Complete  Palliative Care Screening Not Applicable Complete  Medication Review Oceanographer) Referral to Pharmacy Referral to Pharmacy

## 2023-12-28 DIAGNOSIS — S43401A Unspecified sprain of right shoulder joint, initial encounter: Secondary | ICD-10-CM | POA: Diagnosis not present

## 2023-12-28 MED ORDER — TORSEMIDE 20 MG PO TABS
20.0000 mg | ORAL_TABLET | Freq: Every day | ORAL | Status: DC
  Administered 2023-12-28 – 2023-12-29 (×2): 20 mg via ORAL
  Filled 2023-12-28 (×2): qty 1

## 2023-12-28 NOTE — ED Notes (Signed)
 Pt. Soiled brief with urine. pt.cleaned up with washcloth and dryed. Pt. New brief changed with 2 assist.

## 2023-12-28 NOTE — ED Provider Notes (Addendum)
 Emergency Medicine Observation Re-evaluation Note  Peggy Armstrong is a 78 y.o. female, seen on rounds today.  Pt initially presented to the ED for complaints of Shoulder Pain Currently, the patient is awaiting placement.  Physical Exam  BP 131/65 (BP Location: Left Arm)   Pulse 79   Temp 97.9 F (36.6 C) (Oral)   Resp 16   Wt 84.4 kg   SpO2 100%   BMI 36.33 kg/m  Physical Exam General: Calm Cardiac: Well perfused  Lungs: Even respirations   ED Course / MDM  EKG:   I have reviewed the labs performed to date as well as medications administered while in observation.  Recent changes in the last 24 hours include confirming placement location and payment plan.  Plan  Current plan is for placement.    Darra Fonda KANDICE, MD 12/28/23 1055    Darra Fonda KANDICE, MD 12/28/23 1515

## 2023-12-28 NOTE — ED Notes (Signed)
Patient's daughter visiting.

## 2023-12-28 NOTE — Progress Notes (Addendum)
 Awaiting response from Lauren at [facility] to confirm bed and payment plan due to pt being in copay days.  Addend @ 10:11AM CSW called Lauren in admissions and left HIPAA Compliant voicemail requesting call back.

## 2023-12-28 NOTE — Progress Notes (Signed)
 [employer] rep will contact pt's daughter, [name], to discuss finances. CSW to initiate auth.

## 2023-12-28 NOTE — ED Notes (Signed)
 Patient is resting comfortably.

## 2023-12-28 NOTE — ED Notes (Signed)
 Changed pt's brief due to incontinence, and transferred to LTB. In nad, callbell in reach.

## 2023-12-29 DIAGNOSIS — S43401A Unspecified sprain of right shoulder joint, initial encounter: Secondary | ICD-10-CM | POA: Diagnosis not present

## 2023-12-29 DIAGNOSIS — Z743 Need for continuous supervision: Secondary | ICD-10-CM | POA: Diagnosis not present

## 2023-12-29 DIAGNOSIS — Z7401 Bed confinement status: Secondary | ICD-10-CM | POA: Diagnosis not present

## 2023-12-29 DIAGNOSIS — R6889 Other general symptoms and signs: Secondary | ICD-10-CM | POA: Diagnosis not present

## 2023-12-29 NOTE — Progress Notes (Signed)
 12/28/2023  1030  Called report to [phone]. Report given to Jania

## 2023-12-29 NOTE — ED Provider Notes (Signed)
 Emergency Medicine Observation Re-evaluation Note  Peggy Armstrong is a 78 y.o. female, seen on rounds today.  Pt initially presented to the ED for complaints of Shoulder Pain Currently, the patient is resting in the room.  Physical Exam  BP 131/79 (BP Location: Right Wrist)   Pulse 84   Temp 97.6 F (36.4 C) (Oral)   Resp 20   Wt 84.4 kg   SpO2 98%   BMI 36.33 kg/m  Physical Exam General: resting comfortably, NAD Lungs: normal WOB Psych: currently calm and resting  ED Course / MDM  EKG:   I have reviewed the labs performed to date as well as medications administered while in observation.  Recent changes in the last 24 hours include none.  Plan  Current plan is for placement.  Patient has been accepted to [facility] rehab SNF and plan to discharge this morning, transportation called.    Bari Roxie HERO, DO 12/29/23 1017

## 2023-12-29 NOTE — Progress Notes (Addendum)
 Auth still pending for [employer].   TOC anticipates delays due to the holiday.   Addend @ 10:12AM CSW received call from [name] at [facility] who reported a payment plan has been worked out between patient and family. Pt can admit to their facility under private pay without authorization. CSW notified EDP and RN via secure chat. PTAR to transport.   Daughter, Peggy Armstrong, notified.

## 2023-12-30 DIAGNOSIS — R2681 Unsteadiness on feet: Secondary | ICD-10-CM | POA: Diagnosis not present

## 2023-12-30 DIAGNOSIS — Z741 Need for assistance with personal care: Secondary | ICD-10-CM | POA: Diagnosis not present

## 2023-12-30 DIAGNOSIS — M6281 Muscle weakness (generalized): Secondary | ICD-10-CM | POA: Diagnosis not present

## 2023-12-30 DIAGNOSIS — Z7409 Other reduced mobility: Secondary | ICD-10-CM | POA: Diagnosis not present

## 2024-01-01 DIAGNOSIS — R2681 Unsteadiness on feet: Secondary | ICD-10-CM | POA: Diagnosis not present

## 2024-01-01 DIAGNOSIS — M25511 Pain in right shoulder: Secondary | ICD-10-CM | POA: Diagnosis not present

## 2024-01-01 DIAGNOSIS — I509 Heart failure, unspecified: Secondary | ICD-10-CM | POA: Diagnosis not present

## 2024-01-01 DIAGNOSIS — Z741 Need for assistance with personal care: Secondary | ICD-10-CM | POA: Diagnosis not present

## 2024-01-01 DIAGNOSIS — Z7409 Other reduced mobility: Secondary | ICD-10-CM | POA: Diagnosis not present

## 2024-01-01 DIAGNOSIS — C170 Malignant neoplasm of duodenum: Secondary | ICD-10-CM | POA: Diagnosis not present

## 2024-01-01 DIAGNOSIS — S42212D Unspecified displaced fracture of surgical neck of left humerus, subsequent encounter for fracture with routine healing: Secondary | ICD-10-CM | POA: Diagnosis not present

## 2024-01-01 DIAGNOSIS — M6281 Muscle weakness (generalized): Secondary | ICD-10-CM | POA: Diagnosis not present

## 2024-01-02 DIAGNOSIS — M6281 Muscle weakness (generalized): Secondary | ICD-10-CM | POA: Diagnosis not present

## 2024-01-02 DIAGNOSIS — R2681 Unsteadiness on feet: Secondary | ICD-10-CM | POA: Diagnosis not present

## 2024-01-02 DIAGNOSIS — Z741 Need for assistance with personal care: Secondary | ICD-10-CM | POA: Diagnosis not present

## 2024-01-02 DIAGNOSIS — Z7409 Other reduced mobility: Secondary | ICD-10-CM | POA: Diagnosis not present

## 2024-01-03 DIAGNOSIS — Z741 Need for assistance with personal care: Secondary | ICD-10-CM | POA: Diagnosis not present

## 2024-01-03 DIAGNOSIS — R2681 Unsteadiness on feet: Secondary | ICD-10-CM | POA: Diagnosis not present

## 2024-01-03 DIAGNOSIS — Z7409 Other reduced mobility: Secondary | ICD-10-CM | POA: Diagnosis not present

## 2024-01-03 DIAGNOSIS — M6281 Muscle weakness (generalized): Secondary | ICD-10-CM | POA: Diagnosis not present

## 2024-01-04 DIAGNOSIS — N1831 Chronic kidney disease, stage 3a: Secondary | ICD-10-CM | POA: Diagnosis not present

## 2024-01-04 DIAGNOSIS — M5459 Other low back pain: Secondary | ICD-10-CM | POA: Diagnosis not present

## 2024-01-04 DIAGNOSIS — M6281 Muscle weakness (generalized): Secondary | ICD-10-CM | POA: Diagnosis not present

## 2024-01-04 DIAGNOSIS — M25512 Pain in left shoulder: Secondary | ICD-10-CM | POA: Diagnosis not present

## 2024-01-04 DIAGNOSIS — S32021D Stable burst fracture of second lumbar vertebra, subsequent encounter for fracture with routine healing: Secondary | ICD-10-CM | POA: Diagnosis not present

## 2024-01-04 DIAGNOSIS — E119 Type 2 diabetes mellitus without complications: Secondary | ICD-10-CM | POA: Diagnosis not present

## 2024-01-04 DIAGNOSIS — Z7409 Other reduced mobility: Secondary | ICD-10-CM | POA: Diagnosis not present

## 2024-01-04 DIAGNOSIS — R2681 Unsteadiness on feet: Secondary | ICD-10-CM | POA: Diagnosis not present

## 2024-01-04 DIAGNOSIS — I5032 Chronic diastolic (congestive) heart failure: Secondary | ICD-10-CM | POA: Diagnosis not present

## 2024-01-04 DIAGNOSIS — Z741 Need for assistance with personal care: Secondary | ICD-10-CM | POA: Diagnosis not present

## 2024-01-05 DIAGNOSIS — L299 Pruritus, unspecified: Secondary | ICD-10-CM | POA: Diagnosis not present

## 2024-01-08 DIAGNOSIS — M79643 Pain in unspecified hand: Secondary | ICD-10-CM | POA: Diagnosis not present

## 2024-01-08 DIAGNOSIS — D649 Anemia, unspecified: Secondary | ICD-10-CM | POA: Diagnosis not present

## 2024-01-08 DIAGNOSIS — I7300 Raynaud's syndrome without gangrene: Secondary | ICD-10-CM | POA: Diagnosis not present

## 2024-01-08 DIAGNOSIS — R531 Weakness: Secondary | ICD-10-CM | POA: Diagnosis not present

## 2024-01-08 DIAGNOSIS — M069 Rheumatoid arthritis, unspecified: Secondary | ICD-10-CM | POA: Diagnosis not present

## 2024-01-08 DIAGNOSIS — I10 Essential (primary) hypertension: Secondary | ICD-10-CM | POA: Diagnosis not present

## 2024-01-08 DIAGNOSIS — Z792 Long term (current) use of antibiotics: Secondary | ICD-10-CM | POA: Diagnosis not present

## 2024-01-08 DIAGNOSIS — R0902 Hypoxemia: Secondary | ICD-10-CM | POA: Diagnosis not present

## 2024-01-08 DIAGNOSIS — Z87891 Personal history of nicotine dependence: Secondary | ICD-10-CM | POA: Diagnosis not present

## 2024-01-08 DIAGNOSIS — D638 Anemia in other chronic diseases classified elsewhere: Secondary | ICD-10-CM | POA: Diagnosis not present

## 2024-01-09 DIAGNOSIS — M6281 Muscle weakness (generalized): Secondary | ICD-10-CM | POA: Diagnosis not present

## 2024-01-09 DIAGNOSIS — Z7409 Other reduced mobility: Secondary | ICD-10-CM | POA: Diagnosis not present

## 2024-01-09 DIAGNOSIS — M1990 Unspecified osteoarthritis, unspecified site: Secondary | ICD-10-CM | POA: Diagnosis not present

## 2024-01-09 DIAGNOSIS — R2681 Unsteadiness on feet: Secondary | ICD-10-CM | POA: Diagnosis not present

## 2024-01-09 DIAGNOSIS — Z741 Need for assistance with personal care: Secondary | ICD-10-CM | POA: Diagnosis not present

## 2024-01-10 ENCOUNTER — Telehealth: Payer: Self-pay | Admitting: Oncology

## 2024-01-10 DIAGNOSIS — Z7409 Other reduced mobility: Secondary | ICD-10-CM | POA: Diagnosis not present

## 2024-01-10 DIAGNOSIS — M6281 Muscle weakness (generalized): Secondary | ICD-10-CM | POA: Diagnosis not present

## 2024-01-10 DIAGNOSIS — Z741 Need for assistance with personal care: Secondary | ICD-10-CM | POA: Diagnosis not present

## 2024-01-10 DIAGNOSIS — R2681 Unsteadiness on feet: Secondary | ICD-10-CM | POA: Diagnosis not present

## 2024-01-10 NOTE — Telephone Encounter (Signed)
 716/25 Per Carolyn(nursing facility)patient has appt on 01/19/24 at [facility].

## 2024-01-11 DIAGNOSIS — R2681 Unsteadiness on feet: Secondary | ICD-10-CM | POA: Diagnosis not present

## 2024-01-11 DIAGNOSIS — Z741 Need for assistance with personal care: Secondary | ICD-10-CM | POA: Diagnosis not present

## 2024-01-11 DIAGNOSIS — Z7409 Other reduced mobility: Secondary | ICD-10-CM | POA: Diagnosis not present

## 2024-01-11 DIAGNOSIS — M6281 Muscle weakness (generalized): Secondary | ICD-10-CM | POA: Diagnosis not present

## 2024-01-12 DIAGNOSIS — Z7409 Other reduced mobility: Secondary | ICD-10-CM | POA: Diagnosis not present

## 2024-01-12 DIAGNOSIS — Z741 Need for assistance with personal care: Secondary | ICD-10-CM | POA: Diagnosis not present

## 2024-01-12 DIAGNOSIS — M6281 Muscle weakness (generalized): Secondary | ICD-10-CM | POA: Diagnosis not present

## 2024-01-12 DIAGNOSIS — R2681 Unsteadiness on feet: Secondary | ICD-10-CM | POA: Diagnosis not present

## 2024-01-15 DIAGNOSIS — Z7409 Other reduced mobility: Secondary | ICD-10-CM | POA: Diagnosis not present

## 2024-01-15 DIAGNOSIS — Z741 Need for assistance with personal care: Secondary | ICD-10-CM | POA: Diagnosis not present

## 2024-01-15 DIAGNOSIS — M6281 Muscle weakness (generalized): Secondary | ICD-10-CM | POA: Diagnosis not present

## 2024-01-15 DIAGNOSIS — R2681 Unsteadiness on feet: Secondary | ICD-10-CM | POA: Diagnosis not present

## 2024-01-16 DIAGNOSIS — R2681 Unsteadiness on feet: Secondary | ICD-10-CM | POA: Diagnosis not present

## 2024-01-16 DIAGNOSIS — M6281 Muscle weakness (generalized): Secondary | ICD-10-CM | POA: Diagnosis not present

## 2024-01-16 DIAGNOSIS — Z741 Need for assistance with personal care: Secondary | ICD-10-CM | POA: Diagnosis not present

## 2024-01-16 DIAGNOSIS — Z7409 Other reduced mobility: Secondary | ICD-10-CM | POA: Diagnosis not present

## 2024-01-17 ENCOUNTER — Encounter: Payer: Self-pay | Admitting: Oncology

## 2024-01-17 DIAGNOSIS — Z7409 Other reduced mobility: Secondary | ICD-10-CM | POA: Diagnosis not present

## 2024-01-17 DIAGNOSIS — Z741 Need for assistance with personal care: Secondary | ICD-10-CM | POA: Diagnosis not present

## 2024-01-17 DIAGNOSIS — M6281 Muscle weakness (generalized): Secondary | ICD-10-CM | POA: Diagnosis not present

## 2024-01-17 DIAGNOSIS — R2681 Unsteadiness on feet: Secondary | ICD-10-CM | POA: Diagnosis not present

## 2024-01-18 DIAGNOSIS — Z7409 Other reduced mobility: Secondary | ICD-10-CM | POA: Diagnosis not present

## 2024-01-18 DIAGNOSIS — I509 Heart failure, unspecified: Secondary | ICD-10-CM | POA: Diagnosis not present

## 2024-01-18 DIAGNOSIS — Z741 Need for assistance with personal care: Secondary | ICD-10-CM | POA: Diagnosis not present

## 2024-01-18 DIAGNOSIS — I4891 Unspecified atrial fibrillation: Secondary | ICD-10-CM | POA: Diagnosis not present

## 2024-01-18 DIAGNOSIS — R2681 Unsteadiness on feet: Secondary | ICD-10-CM | POA: Diagnosis not present

## 2024-01-18 DIAGNOSIS — M6281 Muscle weakness (generalized): Secondary | ICD-10-CM | POA: Diagnosis not present

## 2024-01-19 ENCOUNTER — Telehealth: Payer: Self-pay

## 2024-01-19 DIAGNOSIS — Z741 Need for assistance with personal care: Secondary | ICD-10-CM | POA: Diagnosis not present

## 2024-01-19 DIAGNOSIS — R531 Weakness: Secondary | ICD-10-CM | POA: Diagnosis not present

## 2024-01-19 DIAGNOSIS — R2681 Unsteadiness on feet: Secondary | ICD-10-CM | POA: Diagnosis not present

## 2024-01-19 DIAGNOSIS — M6281 Muscle weakness (generalized): Secondary | ICD-10-CM | POA: Diagnosis not present

## 2024-01-19 DIAGNOSIS — Z7409 Other reduced mobility: Secondary | ICD-10-CM | POA: Diagnosis not present

## 2024-01-19 DIAGNOSIS — C170 Malignant neoplasm of duodenum: Secondary | ICD-10-CM | POA: Diagnosis not present

## 2024-01-19 NOTE — Telephone Encounter (Signed)
   Pre-operative Risk Assessment    Patient Name: Peggy Armstrong  DOB: 1945/10/27 MRN: [id number]   Date of last office visit: 11/27/23 Date of next office visit: 02/28/24   Request for Surgical Clearance    Procedure:  whipple  Date of Surgery:  Clearance TBD                                Surgeon:  Leonor Dawn, MD Surgeon's Group or Practice Name:  [facility]  Phone number:  [phone] Fax number:  [phone] Attention Leita Corona, CMA   Type of Clearance Requested:   - Medical  - Pharmacy:  Hold Apixaban  (Eliquis ) please advise   Type of Anesthesia:  Not Indicated   Additional requests/questions:    Peggy Armstrong   01/19/2024, 4:47 PM

## 2024-01-23 ENCOUNTER — Inpatient Hospital Stay
Admission: RE | Admit: 2024-01-23 | Discharge: 2024-01-23 | Disposition: A | Discharge status: Home / Self Care | Payer: Self-pay | Source: Ambulatory Visit | Attending: Surgery

## 2024-01-23 ENCOUNTER — Inpatient Hospital Stay
Admission: RE | Admit: 2024-01-23 | Discharge: 2024-01-23 | Disposition: A | Discharge status: Home / Self Care | Payer: Self-pay | Source: Ambulatory Visit | Attending: Surgery | Admitting: Surgery

## 2024-01-23 ENCOUNTER — Ambulatory Visit (HOSPITAL_BASED_OUTPATIENT_CLINIC_OR_DEPARTMENT_OTHER): Admitting: Student

## 2024-01-23 ENCOUNTER — Other Ambulatory Visit: Payer: Self-pay

## 2024-01-23 DIAGNOSIS — R233 Spontaneous ecchymoses: Secondary | ICD-10-CM | POA: Diagnosis not present

## 2024-01-23 DIAGNOSIS — Z7409 Other reduced mobility: Secondary | ICD-10-CM | POA: Diagnosis not present

## 2024-01-23 DIAGNOSIS — Z741 Need for assistance with personal care: Secondary | ICD-10-CM | POA: Diagnosis not present

## 2024-01-23 DIAGNOSIS — C170 Malignant neoplasm of duodenum: Secondary | ICD-10-CM

## 2024-01-23 DIAGNOSIS — R2681 Unsteadiness on feet: Secondary | ICD-10-CM | POA: Diagnosis not present

## 2024-01-23 DIAGNOSIS — M6281 Muscle weakness (generalized): Secondary | ICD-10-CM | POA: Diagnosis not present

## 2024-01-23 NOTE — Telephone Encounter (Signed)
 Dr. Liborio,   You saw this patient on 11/27/2023. Per office protocol, will you please comment on medical clearance for whipple?   Please route your response to P CV DIV Preop. I will communicate with requesting office once you have given recommendations.   Thank you!  Barnie Hila, NP

## 2024-01-23 NOTE — Progress Notes (Signed)
outs

## 2024-01-23 NOTE — Telephone Encounter (Signed)
 Please advise holding Eliquis  prior to Whipple not yet scheduled. Sent as high priority given nature of surgery.   Thank you!  DW

## 2024-01-24 ENCOUNTER — Telehealth: Payer: Self-pay | Admitting: Genetic Counselor

## 2024-01-24 ENCOUNTER — Other Ambulatory Visit: Payer: Self-pay

## 2024-01-24 DIAGNOSIS — M6281 Muscle weakness (generalized): Secondary | ICD-10-CM | POA: Diagnosis not present

## 2024-01-24 DIAGNOSIS — Z7409 Other reduced mobility: Secondary | ICD-10-CM | POA: Diagnosis not present

## 2024-01-24 DIAGNOSIS — R2681 Unsteadiness on feet: Secondary | ICD-10-CM | POA: Diagnosis not present

## 2024-01-24 DIAGNOSIS — Z741 Need for assistance with personal care: Secondary | ICD-10-CM | POA: Diagnosis not present

## 2024-01-24 NOTE — Telephone Encounter (Signed)
   Name: COURTNEI RUDDELL  DOB: Jan 22, 1946  MRN: [id number]  Primary Cardiologist: Jerel Balding, MD  Chart reviewed as part of pre-operative protocol coverage. Because of Shawntae Lowy Magallanes's past medical history and time since last visit, she will require a follow-up in-office visit in order to better assess preoperative cardiovascular risk.  Per Dr. Liborio: It has been almost 2 months since I have last seen her. She does have multiple comorbidities including chronic diastolic CHF, obesity, CKD stage III.  No recent ischemic workup available to review. Poor functional status and limited mobility.   She remains at elevated risk, possibly high risk for perioperative cardiac complications. Given cancer history and the need for Whipple surgery, I would suggest having preop cardiovascular risk assessment also with the cardiologist, earliest available in order to facilitate discussion regarding the risks in person.  Pre-op covering staff: - Please schedule appointment and call patient to inform them. If patient already had an upcoming appointment within acceptable timeframe, please add pre-op clearance to the appointment notes so provider is aware. - Please contact requesting surgeon's office via preferred method (i.e, phone, fax) to inform them of need for appointment prior to surgery.  This message will also be routed to pharmacy pool  for input on holding Eliquis  as requested below so that this information is available to the clearing provider at time of patient's appointment.   Barnie Hila, NP  01/24/2024, 12:49 PM

## 2024-01-24 NOTE — Telephone Encounter (Signed)
1st attempt to reach pt regarding surgical clearance and the need for an IN OFFICE appointment.  Left pt a detailed message to call back and get that scheduled.

## 2024-01-24 NOTE — Telephone Encounter (Signed)
 I messaged Dr. Ezzard, Andrez Foy, PA-C, and [place], CMA on 01/24/2024 recommending genetics due to her personal and family history of cancer (father and child with colon cancer).  Pluma Diniz, MS, [facility] Genetic Counselor [email or web address] (P) [phone]

## 2024-01-24 NOTE — Telephone Encounter (Signed)
 Patient with diagnosis of A Fib + DVT (2017) on Eliquis  for anticoagulation.    Procedure: whipple  Date of procedure: 03/05/24   CHA2DS2-VASc Score = 5  his indicates a 7.2% annual risk of stroke. The patient's score is based upon: CHF History: 1 HTN History: 1 Diabetes History: 0 Stroke History: 0 Vascular Disease History: 0 Age Score: 2 Gender Score: 1    CrCl 49 ml/min Platelet count 199K  Patient has not  had an Afib/aflutter ablation within the last 3 months or DCCV within the last 30 days  Per office protocol, patient can hold Eliquis  for 3 days prior to procedure.    Patient will not need bridging with Lovenox  (enoxaparin ) around procedure.  **This guidance is not considered finalized until pre-operative APP has relayed final recommendations.**

## 2024-01-25 DIAGNOSIS — Z7409 Other reduced mobility: Secondary | ICD-10-CM | POA: Diagnosis not present

## 2024-01-25 DIAGNOSIS — R2681 Unsteadiness on feet: Secondary | ICD-10-CM | POA: Diagnosis not present

## 2024-01-25 DIAGNOSIS — Z741 Need for assistance with personal care: Secondary | ICD-10-CM | POA: Diagnosis not present

## 2024-01-25 DIAGNOSIS — M6281 Muscle weakness (generalized): Secondary | ICD-10-CM | POA: Diagnosis not present

## 2024-01-25 NOTE — Telephone Encounter (Signed)
 Left message for patient to call back to schedule for an in-office appointment . 2nd attempt.

## 2024-01-25 NOTE — Progress Notes (Signed)
 The proposed treatment discussed in conference is for discussion purpose only and is not a binding recommendation.  The patients have not been physically examined, or presented with their treatment options.  Therefore, final treatment plans cannot be decided.

## 2024-01-26 DIAGNOSIS — C170 Malignant neoplasm of duodenum: Secondary | ICD-10-CM | POA: Diagnosis not present

## 2024-01-26 DIAGNOSIS — I482 Chronic atrial fibrillation, unspecified: Secondary | ICD-10-CM | POA: Diagnosis not present

## 2024-01-26 DIAGNOSIS — I5032 Chronic diastolic (congestive) heart failure: Secondary | ICD-10-CM | POA: Diagnosis not present

## 2024-01-26 DIAGNOSIS — E039 Hypothyroidism, unspecified: Secondary | ICD-10-CM | POA: Diagnosis not present

## 2024-01-26 NOTE — Telephone Encounter (Signed)
 Pt has upcoming IN OFFICE appt 02/28/24 with Dr Madireddy at that time preop clearance will be addressed.   Will update surgeons office

## 2024-01-31 DIAGNOSIS — G2581 Restless legs syndrome: Secondary | ICD-10-CM | POA: Diagnosis not present

## 2024-01-31 DIAGNOSIS — M069 Rheumatoid arthritis, unspecified: Secondary | ICD-10-CM | POA: Diagnosis not present

## 2024-01-31 DIAGNOSIS — D631 Anemia in chronic kidney disease: Secondary | ICD-10-CM | POA: Diagnosis not present

## 2024-01-31 DIAGNOSIS — I714 Abdominal aortic aneurysm, without rupture, unspecified: Secondary | ICD-10-CM | POA: Diagnosis not present

## 2024-01-31 DIAGNOSIS — N1831 Chronic kidney disease, stage 3a: Secondary | ICD-10-CM | POA: Diagnosis not present

## 2024-01-31 DIAGNOSIS — I130 Hypertensive heart and chronic kidney disease with heart failure and stage 1 through stage 4 chronic kidney disease, or unspecified chronic kidney disease: Secondary | ICD-10-CM | POA: Diagnosis not present

## 2024-01-31 DIAGNOSIS — I5032 Chronic diastolic (congestive) heart failure: Secondary | ICD-10-CM | POA: Diagnosis not present

## 2024-01-31 DIAGNOSIS — E782 Mixed hyperlipidemia: Secondary | ICD-10-CM | POA: Diagnosis not present

## 2024-01-31 DIAGNOSIS — R6510 Systemic inflammatory response syndrome (SIRS) of non-infectious origin without acute organ dysfunction: Secondary | ICD-10-CM | POA: Diagnosis not present

## 2024-01-31 DIAGNOSIS — S42402D Unspecified fracture of lower end of left humerus, subsequent encounter for fracture with routine healing: Secondary | ICD-10-CM | POA: Diagnosis not present

## 2024-01-31 DIAGNOSIS — I482 Chronic atrial fibrillation, unspecified: Secondary | ICD-10-CM | POA: Diagnosis not present

## 2024-01-31 DIAGNOSIS — C169 Malignant neoplasm of stomach, unspecified: Secondary | ICD-10-CM | POA: Diagnosis not present

## 2024-01-31 DIAGNOSIS — S42212D Unspecified displaced fracture of surgical neck of left humerus, subsequent encounter for fracture with routine healing: Secondary | ICD-10-CM | POA: Diagnosis not present

## 2024-01-31 DIAGNOSIS — M109 Gout, unspecified: Secondary | ICD-10-CM | POA: Diagnosis not present

## 2024-01-31 DIAGNOSIS — C170 Malignant neoplasm of duodenum: Secondary | ICD-10-CM | POA: Diagnosis not present

## 2024-01-31 DIAGNOSIS — E039 Hypothyroidism, unspecified: Secondary | ICD-10-CM | POA: Diagnosis not present

## 2024-01-31 DIAGNOSIS — I4892 Unspecified atrial flutter: Secondary | ICD-10-CM | POA: Diagnosis not present

## 2024-01-31 DIAGNOSIS — J45909 Unspecified asthma, uncomplicated: Secondary | ICD-10-CM | POA: Diagnosis not present

## 2024-01-31 DIAGNOSIS — D509 Iron deficiency anemia, unspecified: Secondary | ICD-10-CM | POA: Diagnosis not present

## 2024-02-01 ENCOUNTER — Encounter (HOSPITAL_BASED_OUTPATIENT_CLINIC_OR_DEPARTMENT_OTHER): Payer: Self-pay | Admitting: Student

## 2024-02-01 ENCOUNTER — Ambulatory Visit (HOSPITAL_BASED_OUTPATIENT_CLINIC_OR_DEPARTMENT_OTHER): Admitting: Student

## 2024-02-01 VITALS — BP 138/82 | HR 79 | Temp 98.0°F | Resp 16 | Ht 60.0 in | Wt 197.0 lb

## 2024-02-01 DIAGNOSIS — Z09 Encounter for follow-up examination after completed treatment for conditions other than malignant neoplasm: Secondary | ICD-10-CM

## 2024-02-01 DIAGNOSIS — I5032 Chronic diastolic (congestive) heart failure: Secondary | ICD-10-CM

## 2024-02-01 DIAGNOSIS — C170 Malignant neoplasm of duodenum: Secondary | ICD-10-CM | POA: Diagnosis not present

## 2024-02-01 DIAGNOSIS — F411 Generalized anxiety disorder: Secondary | ICD-10-CM | POA: Diagnosis not present

## 2024-02-01 DIAGNOSIS — D500 Iron deficiency anemia secondary to blood loss (chronic): Secondary | ICD-10-CM | POA: Diagnosis not present

## 2024-02-01 DIAGNOSIS — R52 Pain, unspecified: Secondary | ICD-10-CM | POA: Diagnosis not present

## 2024-02-01 DIAGNOSIS — E039 Hypothyroidism, unspecified: Secondary | ICD-10-CM

## 2024-02-01 MED ORDER — TRAMADOL HCL 50 MG PO TABS: 50.0000 mg | ORAL_TABLET | Freq: Three times a day (TID) | ORAL | 0 refills | Status: DC | PRN

## 2024-02-01 NOTE — Patient Instructions (Signed)
 It was nice to see you today!  If you have any problems before your next visit feel free to message me via MyChart (minor issues or questions) or call the office, otherwise you may reach out to schedule an office visit.  Thank you! Pau Banh, PA-C

## 2024-02-01 NOTE — Progress Notes (Signed)
 Established Patient Office Visit  Subjective   Patient ID: Peggy Armstrong, female    DOB: [birth year]  Age: 78 y.o. MRN: [id number]  Chief Complaint  Patient presents with   Medical Management of Chronic Issues    Was discharged from medical facility 01/26/2024.    HPI  Discussed the use of AI scribe software for clinical note transcription with the patient, who gave verbal consent to proceed.  History of Present Illness   Peggy Armstrong is a 78 year old female with stage three colon cancer who presents for follow-up after recent hospitalization/rehab facility discharge  She experienced a significant bleed from the cancer, necessitating eight units of blood transfusion during a stay in the ICU at a hospital in [facility].  She has a history of arthritis with severe attacks, particularly affecting her right leg and hand, causing significant pain and immobility. These flare-ups can be debilitating, requiring emergency medical attention. She manages these episodes with Tylenol  and muscle relaxers, but they provide minimal relief.  She experiences fluid retention, particularly in her feet, and is currently on torsemide  20 mg daily to manage this. Her breathing is fine, but her feet hurt due to fluid accumulation.  Her current medications include Protonix  for acid reflux, Eliquis , potassium supplements twice daily, thyroid  medication at 100 micrograms in the morning, and Cymbalta  for depression. She also consumes protein shakes to address low protein levels.  She has been receiving physical therapy at home since returning from the hospital last Friday. She is able to transfer independently between her wheelchair and bed, indicating some improvement in mobility.        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   History of depression 10/21/2023   Atrial fibrillation (HCC)    Back injury    DVT (deep venous thrombosis) (HCC)    Hypertension    Cellulitis of left arm 08/14/2023   Hemorrhagic shock (HCC) 07/21/2023   ABLA (acute blood loss anemia) 07/21/2023   Acute GI bleeding 07/21/2023   AKI (acute kidney injury) (HCC) 07/21/2023   GI bleed 07/20/2023   Chronic low back pain 07/18/2023   Upper GI bleed 06/20/2023   Long term current use of anticoagulant therapy 06/20/2023   Occult blood in stools 05/16/2023   Gastric polyps 05/16/2023   Generalized weakness 05/12/2023   Acute cystitis 05/12/2023   Dehydration 05/12/2023   Acute prerenal azotemia 05/12/2023   Paroxysmal atrial fibrillation (HCC) 05/12/2023   AAA (abdominal aortic aneurysm) (HCC) 05/12/2023   Hyperlipidemia 05/12/2023   GAD (generalized anxiety disorder) 05/12/2023   Hypothyroidism 05/12/2023   Closed compression fracture of L2 lumbar vertebra, initial encounter (HCC) 05/11/2023   Restless leg 12/02/2022   Routine general medical examination at a health care facility 12/13/2019   Chronic diastolic CHF (congestive heart failure) (HCC) 11/08/2019   Morbid obesity (HCC) 11/08/2019   History of DVT (deep vein thrombosis) 11/08/2019   Need for immunization against influenza 07/18/2019   Right shoulder pain 07/26/2018   Need for vaccination 04/23/2018   Duodenal ulcer    Hematemesis 11/05/2017   Macrocytic anemia 11/05/2017   Melena 11/05/2017   Nausea & vomiting    Hypokalemia 07/03/2017   Essential hypertension  Gout    Clotting disorder (HCC)    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   CKD (chronic kidney disease) stage 3, GFR 30-59 ml/min (HCC) 06/09/2017   CKD (chronic kidney disease), stage III (HCC) 06/09/2017   S/P ERCP 05/05/2017   Peri-ampullary neoplasm    Atypical atrial flutter (HCC)     Duodenal mass    Cholangitis    Epigastric pain    Choledocholithiasis 03/22/2017   SIRS (systemic inflammatory response syndrome) (HCC) 03/22/2017   Poor dentition 08/14/2016   Iron deficiency anemia 08/02/2016   RA (rheumatoid arthritis) (HCC) 09/07/2015   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   Cellulitis of left arm 08/14/2023   Cholangitis    Choledocholithiasis 03/22/2017   Chronic diastolic CHF (congestive heart failure) (HCC) 11/08/2019   Chronic low back pain 07/18/2023   CKD (chronic kidney disease) stage 3, GFR 30-59 ml/min (HCC) 06/09/2017   CKD (chronic kidney disease), stage III (HCC) 06/09/2017   Closed compression fracture of L2 lumbar vertebra, initial encounter (HCC) 05/11/2023   Clotting disorder (HCC)    right DVT     Dehydration 05/12/2023   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   Duodenal mass    Duodenal ulcer    DVT (deep venous thrombosis) (HCC)    right DVT   Epigastric pain    Essential hypertension    Fall at home, initial encounter 10/21/2023   GAD (generalized anxiety disorder)    Gastric polyps 05/16/2023   Generalized weakness 05/12/2023   GI bleed 07/20/2023   Gout    Gout    Hematemesis 11/05/2017   Hemorrhagic shock (HCC) 07/21/2023   History of depression 10/21/2023   History of DVT (deep vein thrombosis) 11/08/2019   History of GI bleed 10/21/2023   Hyperlipidemia 05/12/2023   Hypertension    Hypokalemia 07/03/2017   Hypothyroidism 05/12/2023   Iron deficiency anemia 08/02/2016   Left elbow avulsion fracture 10/21/2023   Left humeral  fracture 10/21/2023   Long term current use of anticoagulant therapy 06/20/2023   Macrocytic anemia 11/05/2017   Melena 11/05/2017   Morbid obesity (HCC) 11/08/2019   Nausea & vomiting    Need for immunization against influenza 07/18/2019   Need for vaccination 04/23/2018   Occult blood in stools 05/16/2023   Paroxysmal atrial fibrillation (HCC) 05/12/2023   Peri-ampullary neoplasm    Poor dentition 08/14/2016   RA (rheumatoid arthritis) (HCC)    Restless leg 12/02/2022   Right shoulder pain 07/26/2018   Routine general medical examination at a health care facility 12/13/2019   S/P ERCP 05/05/2017   SIRS (systemic inflammatory response syndrome) (HCC) 03/22/2017   Upper GI bleed 06/20/2023   Social History   Tobacco Use   Smoking status: Former    Types: Cigarettes    Passive exposure: Past   Smokeless tobacco: Former   Tobacco comments:    Quit in 2012. Started at 20's. Cannot recall how many.   Vaping Use   Vaping status: Never Used  Substance Use Topics   Alcohol use: No   Drug use:  No   Allergies  Allergen Reactions   Codeine Nausea And Vomiting and Other (See Comments)    Made me very sick   Tape Other (See Comments)    Tears skin - surgical tape      ROS Per HPI.    Objective:     BP 138/82   Pulse 79   Temp 98 F (36.7 C) (Oral)   Resp 16   Ht 5' (1.524 m)   Wt 197 lb (89.4 kg)   SpO2 97%   BMI 38.47 kg/m  BP Readings from Last 3 Encounters:  02/01/24 138/82  12/29/23 131/79  12/22/23 (!) 122/58   Wt Readings from Last 3 Encounters:  02/01/24 197 lb (89.4 kg)  12/27/23 186 lb (84.4 kg)  11/27/23 185 lb (83.9 kg)     Physical Exam Constitutional:      General: She is not in acute distress.    Appearance: Normal appearance. She is not ill-appearing.  HENT:     Head: Normocephalic and atraumatic.     Nose: Nose normal.  Eyes:     General: No scleral icterus.    Conjunctiva/sclera: Conjunctivae normal.  Cardiovascular:     Rate  and Rhythm: Normal rate and regular rhythm.     Heart sounds: Normal heart sounds. No murmur heard.    No friction rub.  Pulmonary:     Effort: Pulmonary effort is normal. No respiratory distress.     Breath sounds: Normal breath sounds. No wheezing, rhonchi or rales.  Musculoskeletal:        General: Normal range of motion.     Right lower leg: Edema (1+ pitting) present.     Left lower leg: Edema (1+ pitting) present.  Skin:    General: Skin is warm and dry.     Coloration: Skin is not jaundiced or pale.  Neurological:     General: No focal deficit present.     Mental Status: She is alert.  Psychiatric:        Mood and Affect: Mood normal.        Behavior: Behavior normal.      No results found for any visits on 02/01/24.  Last CBC Lab Results  Component Value Date   WBC 8.3 11/17/2023   HGB 7.3 (L) 11/17/2023   HCT 24.7 (L) 11/17/2023   MCV 92.2 11/17/2023   MCH 27.2 11/17/2023   RDW 17.0 (H) 11/17/2023   PLT 199 11/17/2023   Last metabolic panel Lab Results  Component Value Date   GLUCOSE 97 11/17/2023   NA 135 11/17/2023   K 3.8 11/17/2023   CL 98 11/17/2023   CO2 25 11/17/2023   BUN 40 (H) 11/17/2023   CREATININE 1.25 (H) 11/17/2023   GFRNONAA 44 (L) 11/17/2023   CALCIUM  9.0 11/17/2023   PHOS 2.8 07/25/2023   PROT 6.2 (L) 11/17/2023   ALBUMIN 2.7 (L) 11/17/2023   LABGLOB 2.4 07/18/2023   BILITOT 0.3 11/17/2023   ALKPHOS 205 (H) 11/17/2023   AST 50 (H) 11/17/2023   ALT 28 11/17/2023   ANIONGAP 12 11/17/2023   Last lipids Lab Results  Component Value Date   CHOL 127 07/18/2023   HDL 25 (L) 07/18/2023   LDLCALC 80 07/18/2023   TRIG 122 07/18/2023   CHOLHDL 5.1 (H) 07/18/2023   Last hemoglobin A1c Lab Results  Component Value Date   HGBA1C 4.3 (L) 07/18/2023      The ASCVD Risk score (Arnett DK, et al., 2019) failed to  calculate for the following reasons:   The valid total cholesterol range is 130 to 320 mg/dL    Assessment & Plan:    Assessment and Plan    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 Hemoglobin improved from 7.1.  Recurrent arthritis attacks, gout vs arthritis vs cancer pain/Duodenal Adenocarcinoma Recurrent arthritis attacks, possibly gout-related, affecting various joints including the right leg and hand. Severe pain during attacks, leading to immobility and emergency care. Concerns about NSAIDs due to risk of GI bleeding. - Prescribe tramadol  for pain management during times of pain - Instruct to take tramadol  at first sign of attack - Avoid NSAIDs due to risk of GI bleeding - May discuss with Dr. Ezzard if we believe this to be cancer related  Heart failure Chronic, stable. Heart failure without fluid overload on exam managed with torsemide  20 mg daily. - Continue torsemide  20 mg daily  GAD Chronic, stable. GAD managed with duloxetine . No current issues reported with medication. - Continue duloxetine  as prescribed  Hypothyroidism Hypothyroidism managed with levothyroxine  100 mcg daily. No issues reported with current regimen. - Continue levothyroxine  100 mcg daily      I personally spent a total of 35 minutes in the care of the patient today including preparing to see the patient, getting/reviewing separately obtained history, performing a medically appropriate exam/evaluation, counseling and educating, placing orders, and documenting clinical information in the EHR.   Return in about 3 months (around 05/03/2024) for Chronic Followup.    Etienne Millward T Diedre Maclellan, PA-C

## 2024-02-02 ENCOUNTER — Other Ambulatory Visit: Payer: Self-pay | Admitting: Oncology

## 2024-02-02 ENCOUNTER — Telehealth (HOSPITAL_BASED_OUTPATIENT_CLINIC_OR_DEPARTMENT_OTHER): Payer: Self-pay | Admitting: Student

## 2024-02-02 ENCOUNTER — Inpatient Hospital Stay

## 2024-02-02 ENCOUNTER — Inpatient Hospital Stay: Admitting: Oncology

## 2024-02-02 DIAGNOSIS — C170 Malignant neoplasm of duodenum: Secondary | ICD-10-CM

## 2024-02-02 NOTE — Progress Notes (Unsigned)
 [facility]  [address] [place],  [place]  [address] [phone]  Clinic Day:  12/22/2023  Referring physician: Iven [name] DASEN, PA-C   HISTORY OF PRESENT ILLNESS:  The patient is a 78 y.o. female who I recently began seeing for newly diagnosed duodenal adenocarcinoma.  She comes in today to go over her PET scan images to determine if there is any occult evidence of disease metastasis.  She also comes in today to go over her Guardant testing to determine if her disease harbors a mutation/alteration for which some form of targeted therapy could be applied.  Since her last visit, the patient claims to be feeling better.  She claims to be walking more from the rehabilitation she has been receiving at her nursing facility.  She still holds her side hope that some type of treatment can be given to eradicate her duodenal adenocarcinoma.  PHYSICAL EXAM:  There were no vitals taken for this visit. Wt Readings from Last 3 Encounters:  02/01/24 197 lb (89.4 kg)  12/27/23 186 lb (84.4 kg)  11/27/23 185 lb (83.9 kg)   There is no height or weight on file to calculate BMI. Performance status (ECOG): 2 - Symptomatic, <50% confined to bed Physical Exam Constitutional:      Appearance: Normal appearance. She is obese. She is not ill-appearing.     Comments: She has a chronically ill appearance.  She is in a wheelchair and is wearing oxygen per nasal cannula.  HENT:     Mouth/Throat:     Mouth: Mucous membranes are moist.     Pharynx: Oropharynx is clear. No oropharyngeal exudate or posterior oropharyngeal erythema.  Cardiovascular:     Rate and Rhythm: Normal rate and regular rhythm.     Heart sounds: No murmur heard.    No friction rub. No gallop.  Pulmonary:     Effort: Pulmonary effort is normal. No respiratory distress.     Breath sounds: Decreased air movement present. Decreased breath sounds present. No wheezing, rhonchi or rales.  Abdominal:      General: Bowel sounds are normal. There is no distension.     Palpations: Abdomen is soft. There is no mass.     Tenderness: There is no abdominal tenderness.  Musculoskeletal:        General: No swelling.     Right lower leg: No edema.     Left lower leg: No edema.  Lymphadenopathy:     Cervical: No cervical adenopathy.     Upper Body:     Right upper body: No supraclavicular or axillary adenopathy.     Left upper body: No supraclavicular or axillary adenopathy.     Lower Body: No right inguinal adenopathy. No left inguinal adenopathy.  Skin:    General: Skin is warm.     Coloration: Skin is not jaundiced.     Findings: No lesion or rash.  Neurological:     General: No focal deficit present.     Mental Status: She is alert and oriented to person, place, and time. Mental status is at baseline.  Psychiatric:        Mood and Affect: Mood normal.        Behavior: Behavior normal.        Thought Content: Thought content normal.   SCANS: Her PET scan done yesterday revealed the following: FINDINGS: Physiologic uptake: Mediastinum: 1.59 SUV max Liver: 2.18 SUV max   NECK: Heterogeneous, nodular thyroid  gland. Some coarse  calcifications are present in the gland. Asymmetric increased activity in the right thyroid  lobe with an SUV max of 3.61  CHEST: Chest wall- There are no significant chest wall abnormalities. Axilla- There are no significant axillary abnormalities. Lung parenchyma- There are no significant lung parenchyma abnormalities. Mediastinum- There are no significant hilar or mediastinal adenopathy. Pleura- There are no significant pleural abnormalities.  ABDOMEN: Stomach- No significant abnormalities. Liver- No significant abnormalities. Spleen- No significant abnormalities. Pancreas-calcifications Kidneys- No significant abnormalities. Bowel-moderate-to-large hiatus hernia is present. There is some activity of the 2nd duodenum corresponding to the area of concern  seen on prior standard CT with an SUV maximum 5.08. While the activity at this site may be physiologic, the known position of the lesion suggests this is likely neoplastic and/or inflammatory. No soft tissue mass seen. Other, scattered sites of activity in the bowel are very likely physiologic. Spine- No significant abnormalities.  PELVIS: Bowel- Normal physiologic bowel activity is identified. Masses- There are no pelvic masses.  Bones-there is some activity around the left shoulder associated with fracture of the proximal humerus. 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 Partial healing of fracture at the junction between the pubis and inferior pubic ramus and between the superior pubic ramus and acetabulum.  IMPRESSION: 1. Focal radiotracer uptake at the 2nd duodenum consistent with the patient's known malignancy. No additional site of activity in the abdomen. 2. Decreased bone mineralization. Activity about the left proximal humerus is associated with humeral fracture. Correlate with history. If no prior imaging of this region was performed consider dedicated shoulder series and orthopedic consultation. Also healing fractures and nonunion involving the superior and inferior pubic rami on the left. 3. Compressive atelectasis right lower lobe related to moderate pleural effusion. Heart is enlarged. Coronary artery disease is present. Large hiatus hernia. 4. Heterogeneous, nodular thyroid  gland. Mild increased activity in the right thyroid  lobe. Consider thyroiditis. Correlation with laboratory indices suggested. Ultrasound may be helpful to exclude underlying worrisome nodule.  LABS:      Latest Ref Rng & Units 11/17/2023    2:34 PM 10/25/2023    6:29 AM 10/24/2023    6:41 PM  CBC  WBC 4.0 - 10.5 K/uL 8.3  6.5  8.8   Hemoglobin 12.0 - 15.0 g/dL 7.3  8.1  8.0   Hematocrit 36.0 - 46.0 % 24.7  25.9  25.8   Platelets 150 - 400 K/uL 199  118  98       Latest Ref Rng & Units  11/17/2023    2:34 PM 10/25/2023    6:29 AM 10/24/2023    6:36 AM  CMP  Glucose 70 - 99 mg/dL 97  92  94   BUN 8 - 23 mg/dL 40  28  23   Creatinine 0.44 - 1.00 mg/dL 8.74  8.68  8.73   Sodium 135 - 145 mmol/L 135  133  132   Potassium 3.5 - 5.1 mmol/L 3.8  2.9  3.5   Chloride 98 - 111 mmol/L 98  95  99   CO2 22 - 32 mmol/L 25  26  25    Calcium  8.9 - 10.3 mg/dL 9.0  8.0  7.7   Total Protein 6.5 - 8.1 g/dL 6.2     Total Bilirubin 0.0 - 1.2 mg/dL 0.3     Alkaline Phos 38 - 126 U/L 205     AST 15 - 41 U/L 50     ALT 0 - 44 U/L 28  Lab Results  Component Value Date   CEA 4.18 11/17/2023     Latest Reference Range & Units 11/21/23 12:07  Iron 28 - 170 ug/dL 30  UIBC ug/dL 692  TIBC 749 - 549 ug/dL 662  Saturation Ratios 10.4 - 31.8 % 9 (L)  Ferritin 11 - 307 ng/mL 65  (L): Data is abnormally low  PATHOLOGY:  Her duodenal mass biopsy in April 2025 revealed the following: Diagnosis   A: Duodenum, mass, biopsy - 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  ASSESSMENT & PLAN:  A 78 y.o. female with duodenal adenocarcinoma.  In clinic today, I went over her PET scan images with her and her family, for which they could see there is no evidence of metastatic disease.  I also went over her Guardant test results with her, which unfortunately did not show any findings to suggest some form of targeted therapy could be used to address her disease.  The patient still holds aside hope that some of surgery can be done to remove her duodenal adenocarcinoma.  The patient is well aware of what was told to her initially at [facility], where they felt a surgery somewhat similar to  a Whipple procedure would be necessary to remove her tumor.  This patient, at best, has an ECOG 2 (likely 3) performance status.  I was very candid with the patient and her family in stating that I am not sure how well she could even tolerate neoadjuvant chemotherapy, which could potentially be used to reduce her tumor burden before undergoing surgery.  I really do not see how her current baseline health would be amenable for her to undergo surgery.  Nevertheless, the patient still wishes to have a second opinion regarding surgical options.  Based upon this, she will be sent to [facility] in [place] to discuss surgical opportunities.  I will see this patient back in 6 weeks for repeat clinical assessment.  Hopefully, by then, she will have had her second surgical opinion with regards to how to address her duodenal adenocarcinoma.  The patient and her family understand all the plans discussed today and are in agreement with them.    Kaiel Weide DELENA Kerns, MD

## 2024-02-02 NOTE — Telephone Encounter (Signed)
 Copied from CRM #[id number]. Topic: Clinical - Home Health Verbal Orders >> Feb 02, 2024  8:42 AM Charlet HERO wrote: Caller/Agency:Sandra/ [facility] Callback Number: [phone] Service Requested: Skilled Nursing Frequency: weekly x 3 Any new concerns about the patient? No

## 2024-02-04 DIAGNOSIS — S32021D Stable burst fracture of second lumbar vertebra, subsequent encounter for fracture with routine healing: Secondary | ICD-10-CM | POA: Diagnosis not present

## 2024-02-05 NOTE — Telephone Encounter (Signed)
 Gave VO approval to [facility].

## 2024-02-06 ENCOUNTER — Telehealth: Payer: Self-pay

## 2024-02-06 NOTE — Telephone Encounter (Signed)
   Pre-operative Risk Assessment    Patient Name: Peggy Armstrong  DOB: Aug 21, 1945 MRN: [id number]      Request for Surgical Clearance    Procedure:  Whipple  Date of Surgery:  Clearance 03/05/24                                 Surgeon:  Dr. Leonor Dawn Surgeon's Group or Practice Name:  [facility] Phone number:  [phone] Fax number:  [phone]   Type of Clearance Requested:   - Medical  - Pharmacy:  Hold Apixaban  (Eliquis ) Def to Cards   Type of Anesthesia:  General    Additional requests/questions:  Dr Dawn would like for pt to be seen earlier than 9/3 because if pt needs any testing it needs to be done before her surgery date.  Bonney Rojelio Kays   02/06/2024, 3:21 PM

## 2024-02-06 NOTE — Telephone Encounter (Signed)
 1st attempt to reach pt regarding surgical clearance and the need for an sooner IN OFFICE appointment than the one currently scheduled for 9/3. LVMFCB

## 2024-02-07 NOTE — Telephone Encounter (Signed)
 Called and spoke with patient, she will have to contact her daughter to schedule the appointment being that she is her transportation.

## 2024-02-08 ENCOUNTER — Ambulatory Visit: Payer: Self-pay

## 2024-02-08 DIAGNOSIS — I130 Hypertensive heart and chronic kidney disease with heart failure and stage 1 through stage 4 chronic kidney disease, or unspecified chronic kidney disease: Secondary | ICD-10-CM | POA: Diagnosis not present

## 2024-02-08 DIAGNOSIS — R6510 Systemic inflammatory response syndrome (SIRS) of non-infectious origin without acute organ dysfunction: Secondary | ICD-10-CM | POA: Diagnosis not present

## 2024-02-08 DIAGNOSIS — G2581 Restless legs syndrome: Secondary | ICD-10-CM | POA: Diagnosis not present

## 2024-02-08 DIAGNOSIS — M109 Gout, unspecified: Secondary | ICD-10-CM | POA: Diagnosis not present

## 2024-02-08 DIAGNOSIS — I4892 Unspecified atrial flutter: Secondary | ICD-10-CM | POA: Diagnosis not present

## 2024-02-08 DIAGNOSIS — C169 Malignant neoplasm of stomach, unspecified: Secondary | ICD-10-CM | POA: Diagnosis not present

## 2024-02-08 DIAGNOSIS — J45909 Unspecified asthma, uncomplicated: Secondary | ICD-10-CM | POA: Diagnosis not present

## 2024-02-08 DIAGNOSIS — S42402D Unspecified fracture of lower end of left humerus, subsequent encounter for fracture with routine healing: Secondary | ICD-10-CM | POA: Diagnosis not present

## 2024-02-08 DIAGNOSIS — D631 Anemia in chronic kidney disease: Secondary | ICD-10-CM | POA: Diagnosis not present

## 2024-02-08 DIAGNOSIS — M069 Rheumatoid arthritis, unspecified: Secondary | ICD-10-CM | POA: Diagnosis not present

## 2024-02-08 DIAGNOSIS — C170 Malignant neoplasm of duodenum: Secondary | ICD-10-CM | POA: Diagnosis not present

## 2024-02-08 DIAGNOSIS — N1831 Chronic kidney disease, stage 3a: Secondary | ICD-10-CM | POA: Diagnosis not present

## 2024-02-08 DIAGNOSIS — E782 Mixed hyperlipidemia: Secondary | ICD-10-CM | POA: Diagnosis not present

## 2024-02-08 DIAGNOSIS — D509 Iron deficiency anemia, unspecified: Secondary | ICD-10-CM | POA: Diagnosis not present

## 2024-02-08 DIAGNOSIS — I482 Chronic atrial fibrillation, unspecified: Secondary | ICD-10-CM | POA: Diagnosis not present

## 2024-02-08 DIAGNOSIS — S42212D Unspecified displaced fracture of surgical neck of left humerus, subsequent encounter for fracture with routine healing: Secondary | ICD-10-CM | POA: Diagnosis not present

## 2024-02-08 DIAGNOSIS — E039 Hypothyroidism, unspecified: Secondary | ICD-10-CM | POA: Diagnosis not present

## 2024-02-08 DIAGNOSIS — I714 Abdominal aortic aneurysm, without rupture, unspecified: Secondary | ICD-10-CM | POA: Diagnosis not present

## 2024-02-08 DIAGNOSIS — I5032 Chronic diastolic (congestive) heart failure: Secondary | ICD-10-CM | POA: Diagnosis not present

## 2024-02-08 NOTE — Telephone Encounter (Signed)
 FYI Only or Action Required?: Action required by provider: clinical question for provider.  Patient was last seen in primary care on 09/15/2023 by Blair, Diane W, FNP.  Called Nurse Triage reporting Pain.  Interventions attempted: Prescription medications: traMADol  (ULTRAM ) 50 MG tablet with relief.  Symptoms are: gradually improving.  Triage Disposition: Call PCP When Office is Open  Patient/caregiver understands and will follow disposition?: Yes     Copied from CRM #[id number]. Topic: Clinical - Medication Question >> Feb 08, 2024  1:46 PM [name] wrote: Reason for CRM: Patient is asking if it's ok take a pain that she has from last year for when she fell. She's not sure which medication it is. Call back # [phone]. Reason for Disposition  [1] Caller has NON-URGENT medicine question about med that PCP prescribed AND [2] triager unable to answer question  Answer Assessment - Initial Assessment Questions 1. NAME of MEDICINE: What medicine(s) are you calling about?     traMADol  (ULTRAM ) 50 MG tablet 2. QUESTION: What is your question? (e.g., double dose of medicine, side effect)     Pt inquiring about recent Rx given from fall can be taken for arthritis pain. 3. PRESCRIBER: Who prescribed the medicine? Reason: if prescribed by specialist, call should be referred to that group.     PCP 4. SYMPTOMS: Do you have any symptoms? If Yes, ask: What symptoms are you having?  How bad are the symptoms (e.g., mild, moderate, severe)     Hands, arms, back pain 5. PREGNANCY:  Is there any chance that you are pregnant? When was your last menstrual period?     N/a    Triager will forward encounter for Dr Peggy Armstrong 's office to review and advise. Patient verbalized understanding and is expecting call back from office for next steps.  Of note, triage was limited d/t pt low talking/not speaking clearly.  Answer Assessment - Initial Assessment Questions 1. ONSET: When did the  muscle aches or body pains start?      Chronic arthritis Pt reports having pain in arms, hands and back - thinks r/t arthritis 2. LOCATION: What part of your body is hurting? (e.g., entire body, arms, legs)      Arms, hands, back 3. SEVERITY: How bad is the pain? (Scale 1-10; or mild, moderate, severe)     Unable to answer 4. CAUSE: What do you think is causing the pains?     arthritis 5. FEVER: Do you have a fever? If Yes, ask: What is your temperature, how was it measured, and  when did it start?      denies 6. OTHER SYMPTOMS: Do you have any other symptoms? (e.g., chest pain, cold or flu symptoms, rash, weakness, weight loss)     denies 7. PREGNANCY: Is there any chance you are pregnant? When was your last menstrual period?     N/a 8. TRAVEL: Have you traveled out of the country in the last month? (e.g., exposures, travel history)     N/a  Protocols used: Muscle Aches and Body Pain-A-AH, Medication Question Call-A-AH

## 2024-02-09 DIAGNOSIS — J45909 Unspecified asthma, uncomplicated: Secondary | ICD-10-CM | POA: Diagnosis not present

## 2024-02-09 DIAGNOSIS — E039 Hypothyroidism, unspecified: Secondary | ICD-10-CM | POA: Diagnosis not present

## 2024-02-09 DIAGNOSIS — E782 Mixed hyperlipidemia: Secondary | ICD-10-CM | POA: Diagnosis not present

## 2024-02-09 DIAGNOSIS — M069 Rheumatoid arthritis, unspecified: Secondary | ICD-10-CM | POA: Diagnosis not present

## 2024-02-09 DIAGNOSIS — D509 Iron deficiency anemia, unspecified: Secondary | ICD-10-CM | POA: Diagnosis not present

## 2024-02-09 DIAGNOSIS — I4892 Unspecified atrial flutter: Secondary | ICD-10-CM | POA: Diagnosis not present

## 2024-02-09 DIAGNOSIS — I482 Chronic atrial fibrillation, unspecified: Secondary | ICD-10-CM | POA: Diagnosis not present

## 2024-02-09 DIAGNOSIS — C169 Malignant neoplasm of stomach, unspecified: Secondary | ICD-10-CM | POA: Diagnosis not present

## 2024-02-09 DIAGNOSIS — G2581 Restless legs syndrome: Secondary | ICD-10-CM | POA: Diagnosis not present

## 2024-02-09 DIAGNOSIS — C170 Malignant neoplasm of duodenum: Secondary | ICD-10-CM | POA: Diagnosis not present

## 2024-02-09 DIAGNOSIS — S42212D Unspecified displaced fracture of surgical neck of left humerus, subsequent encounter for fracture with routine healing: Secondary | ICD-10-CM | POA: Diagnosis not present

## 2024-02-09 DIAGNOSIS — I130 Hypertensive heart and chronic kidney disease with heart failure and stage 1 through stage 4 chronic kidney disease, or unspecified chronic kidney disease: Secondary | ICD-10-CM | POA: Diagnosis not present

## 2024-02-09 DIAGNOSIS — R6510 Systemic inflammatory response syndrome (SIRS) of non-infectious origin without acute organ dysfunction: Secondary | ICD-10-CM | POA: Diagnosis not present

## 2024-02-09 DIAGNOSIS — M109 Gout, unspecified: Secondary | ICD-10-CM | POA: Diagnosis not present

## 2024-02-09 DIAGNOSIS — S42402D Unspecified fracture of lower end of left humerus, subsequent encounter for fracture with routine healing: Secondary | ICD-10-CM | POA: Diagnosis not present

## 2024-02-09 DIAGNOSIS — N1831 Chronic kidney disease, stage 3a: Secondary | ICD-10-CM | POA: Diagnosis not present

## 2024-02-09 DIAGNOSIS — I714 Abdominal aortic aneurysm, without rupture, unspecified: Secondary | ICD-10-CM | POA: Diagnosis not present

## 2024-02-09 DIAGNOSIS — I5032 Chronic diastolic (congestive) heart failure: Secondary | ICD-10-CM | POA: Diagnosis not present

## 2024-02-09 DIAGNOSIS — D631 Anemia in chronic kidney disease: Secondary | ICD-10-CM | POA: Diagnosis not present

## 2024-02-09 NOTE — Telephone Encounter (Signed)
 Left detailed msg on [name]'s VM w/ VO approval.  Copied from CRM #[id number]. Topic: Clinical - Request for Lab/Test Order >> Feb 08, 2024  4:50 PM [name] wrote: Reason for CRM: Peggy Armstrong with [facility].  She is calling for orders. PT 1week 8  [phone]

## 2024-02-09 NOTE — Telephone Encounter (Signed)
 Pt has appt 02/28/24 with Dr. Liborio for preop clearance.   I will update all parties involved of appt.

## 2024-02-10 ENCOUNTER — Encounter (HOSPITAL_COMMUNITY): Payer: Self-pay | Admitting: Pharmacy Technician

## 2024-02-10 ENCOUNTER — Observation Stay (HOSPITAL_COMMUNITY)

## 2024-02-10 ENCOUNTER — Other Ambulatory Visit: Payer: Self-pay

## 2024-02-10 ENCOUNTER — Inpatient Hospital Stay (HOSPITAL_COMMUNITY)
Admission: EM | Admit: 2024-02-10 | Discharge: 2024-02-25 | DRG: 374 | Disposition: A | Discharge status: Skilled Nursing Facility | Attending: Internal Medicine | Admitting: Internal Medicine

## 2024-02-10 DIAGNOSIS — N1831 Chronic kidney disease, stage 3a: Secondary | ICD-10-CM | POA: Diagnosis present

## 2024-02-10 DIAGNOSIS — D696 Thrombocytopenia, unspecified: Secondary | ICD-10-CM | POA: Diagnosis present

## 2024-02-10 DIAGNOSIS — D539 Nutritional anemia, unspecified: Principal | ICD-10-CM | POA: Diagnosis present

## 2024-02-10 DIAGNOSIS — K2289 Other specified disease of esophagus: Secondary | ICD-10-CM | POA: Diagnosis not present

## 2024-02-10 DIAGNOSIS — K449 Diaphragmatic hernia without obstruction or gangrene: Secondary | ICD-10-CM | POA: Diagnosis present

## 2024-02-10 DIAGNOSIS — Z87891 Personal history of nicotine dependence: Secondary | ICD-10-CM

## 2024-02-10 DIAGNOSIS — C170 Malignant neoplasm of duodenum: Principal | ICD-10-CM | POA: Diagnosis present

## 2024-02-10 DIAGNOSIS — Z8509 Personal history of malignant neoplasm of other digestive organs: Secondary | ICD-10-CM

## 2024-02-10 DIAGNOSIS — M48061 Spinal stenosis, lumbar region without neurogenic claudication: Secondary | ICD-10-CM | POA: Diagnosis not present

## 2024-02-10 DIAGNOSIS — M069 Rheumatoid arthritis, unspecified: Secondary | ICD-10-CM | POA: Diagnosis present

## 2024-02-10 DIAGNOSIS — Z91048 Other nonmedicinal substance allergy status: Secondary | ICD-10-CM

## 2024-02-10 DIAGNOSIS — J80 Acute respiratory distress syndrome: Secondary | ICD-10-CM | POA: Diagnosis present

## 2024-02-10 DIAGNOSIS — K317 Polyp of stomach and duodenum: Secondary | ICD-10-CM | POA: Diagnosis present

## 2024-02-10 DIAGNOSIS — Z8261 Family history of arthritis: Secondary | ICD-10-CM

## 2024-02-10 DIAGNOSIS — N183 Chronic kidney disease, stage 3 unspecified: Secondary | ICD-10-CM | POA: Diagnosis present

## 2024-02-10 DIAGNOSIS — E876 Hypokalemia: Secondary | ICD-10-CM | POA: Diagnosis not present

## 2024-02-10 DIAGNOSIS — Z833 Family history of diabetes mellitus: Secondary | ICD-10-CM

## 2024-02-10 DIAGNOSIS — Z86718 Personal history of other venous thrombosis and embolism: Secondary | ICD-10-CM

## 2024-02-10 DIAGNOSIS — R296 Repeated falls: Secondary | ICD-10-CM | POA: Diagnosis present

## 2024-02-10 DIAGNOSIS — K3189 Other diseases of stomach and duodenum: Secondary | ICD-10-CM | POA: Diagnosis not present

## 2024-02-10 DIAGNOSIS — N3000 Acute cystitis without hematuria: Secondary | ICD-10-CM | POA: Diagnosis not present

## 2024-02-10 DIAGNOSIS — Z8249 Family history of ischemic heart disease and other diseases of the circulatory system: Secondary | ICD-10-CM

## 2024-02-10 DIAGNOSIS — K922 Gastrointestinal hemorrhage, unspecified: Secondary | ICD-10-CM | POA: Diagnosis present

## 2024-02-10 DIAGNOSIS — S32021A Stable burst fracture of second lumbar vertebra, initial encounter for closed fracture: Secondary | ICD-10-CM | POA: Diagnosis not present

## 2024-02-10 DIAGNOSIS — R5381 Other malaise: Secondary | ICD-10-CM | POA: Diagnosis present

## 2024-02-10 DIAGNOSIS — C241 Malignant neoplasm of ampulla of Vater: Secondary | ICD-10-CM | POA: Diagnosis not present

## 2024-02-10 DIAGNOSIS — Z8711 Personal history of peptic ulcer disease: Secondary | ICD-10-CM

## 2024-02-10 DIAGNOSIS — I5033 Acute on chronic diastolic (congestive) heart failure: Secondary | ICD-10-CM | POA: Diagnosis not present

## 2024-02-10 DIAGNOSIS — K5900 Constipation, unspecified: Secondary | ICD-10-CM | POA: Diagnosis present

## 2024-02-10 DIAGNOSIS — Z7189 Other specified counseling: Secondary | ICD-10-CM

## 2024-02-10 DIAGNOSIS — I4719 Other supraventricular tachycardia: Secondary | ICD-10-CM | POA: Diagnosis present

## 2024-02-10 DIAGNOSIS — Z7901 Long term (current) use of anticoagulants: Secondary | ICD-10-CM

## 2024-02-10 DIAGNOSIS — R6889 Other general symptoms and signs: Secondary | ICD-10-CM | POA: Diagnosis not present

## 2024-02-10 DIAGNOSIS — Z515 Encounter for palliative care: Secondary | ICD-10-CM

## 2024-02-10 DIAGNOSIS — I700 Atherosclerosis of aorta: Secondary | ICD-10-CM | POA: Diagnosis not present

## 2024-02-10 DIAGNOSIS — E039 Hypothyroidism, unspecified: Secondary | ICD-10-CM | POA: Diagnosis not present

## 2024-02-10 DIAGNOSIS — Z90710 Acquired absence of both cervix and uterus: Secondary | ICD-10-CM

## 2024-02-10 DIAGNOSIS — D62 Acute posthemorrhagic anemia: Secondary | ICD-10-CM

## 2024-02-10 DIAGNOSIS — Z8042 Family history of malignant neoplasm of prostate: Secondary | ICD-10-CM

## 2024-02-10 DIAGNOSIS — Z85068 Personal history of other malignant neoplasm of small intestine: Secondary | ICD-10-CM

## 2024-02-10 DIAGNOSIS — I484 Atypical atrial flutter: Secondary | ICD-10-CM | POA: Diagnosis present

## 2024-02-10 DIAGNOSIS — Z79899 Other long term (current) drug therapy: Secondary | ICD-10-CM

## 2024-02-10 DIAGNOSIS — Z9181 History of falling: Secondary | ICD-10-CM

## 2024-02-10 DIAGNOSIS — I480 Paroxysmal atrial fibrillation: Secondary | ICD-10-CM | POA: Diagnosis present

## 2024-02-10 DIAGNOSIS — I5032 Chronic diastolic (congestive) heart failure: Secondary | ICD-10-CM | POA: Diagnosis present

## 2024-02-10 DIAGNOSIS — Z800 Family history of malignant neoplasm of digestive organs: Secondary | ICD-10-CM

## 2024-02-10 DIAGNOSIS — S32010A Wedge compression fracture of first lumbar vertebra, initial encounter for closed fracture: Secondary | ICD-10-CM | POA: Insufficient documentation

## 2024-02-10 DIAGNOSIS — M419 Scoliosis, unspecified: Secondary | ICD-10-CM | POA: Diagnosis not present

## 2024-02-10 DIAGNOSIS — E669 Obesity, unspecified: Secondary | ICD-10-CM | POA: Diagnosis present

## 2024-02-10 DIAGNOSIS — Z79890 Hormone replacement therapy (postmenopausal): Secondary | ICD-10-CM

## 2024-02-10 DIAGNOSIS — R52 Pain, unspecified: Secondary | ICD-10-CM

## 2024-02-10 DIAGNOSIS — M549 Dorsalgia, unspecified: Secondary | ICD-10-CM | POA: Diagnosis not present

## 2024-02-10 DIAGNOSIS — F32A Depression, unspecified: Secondary | ICD-10-CM | POA: Diagnosis present

## 2024-02-10 DIAGNOSIS — D649 Anemia, unspecified: Secondary | ICD-10-CM | POA: Diagnosis present

## 2024-02-10 DIAGNOSIS — S32019A Unspecified fracture of first lumbar vertebra, initial encounter for closed fracture: Secondary | ICD-10-CM | POA: Diagnosis present

## 2024-02-10 DIAGNOSIS — I130 Hypertensive heart and chronic kidney disease with heart failure and stage 1 through stage 4 chronic kidney disease, or unspecified chronic kidney disease: Secondary | ICD-10-CM | POA: Diagnosis not present

## 2024-02-10 DIAGNOSIS — T502X5A Adverse effect of carbonic-anhydrase inhibitors, benzothiadiazides and other diuretics, initial encounter: Secondary | ICD-10-CM | POA: Diagnosis present

## 2024-02-10 DIAGNOSIS — Z6838 Body mass index (BMI) 38.0-38.9, adult: Secondary | ICD-10-CM

## 2024-02-10 DIAGNOSIS — Z885 Allergy status to narcotic agent status: Secondary | ICD-10-CM

## 2024-02-10 HISTORY — DX: Wedge compression fracture of first lumbar vertebra, initial encounter for closed fracture: S32.010A

## 2024-02-10 HISTORY — DX: Anemia, unspecified: D64.9

## 2024-02-10 LAB — COMPREHENSIVE METABOLIC PANEL WITH GFR
ALT: 22 U/L (ref 0–44)
AST: 34 U/L (ref 15–41)
Albumin: 2.4 g/dL — ABNORMAL LOW (ref 3.5–5.0)
Alkaline Phosphatase: 187 U/L — ABNORMAL HIGH (ref 38–126)
Anion gap: 11 (ref 5–15)
BUN: 29 mg/dL — ABNORMAL HIGH (ref 8–23)
CO2: 23 mmol/L (ref 22–32)
Calcium: 8.1 mg/dL — ABNORMAL LOW (ref 8.9–10.3)
Chloride: 105 mmol/L (ref 98–111)
Creatinine, Ser: 1.05 mg/dL — ABNORMAL HIGH (ref 0.44–1.00)
GFR, Estimated: 54 mL/min — ABNORMAL LOW (ref >60)
Glucose, Bld: 97 mg/dL (ref 70–99)
Potassium: 4 mmol/L (ref 3.5–5.1)
Sodium: 139 mmol/L (ref 135–145)
Total Bilirubin: 0.7 mg/dL (ref 0.0–1.2)
Total Protein: 5.7 g/dL — ABNORMAL LOW (ref 6.5–8.1)

## 2024-02-10 LAB — URINALYSIS, ROUTINE W REFLEX MICROSCOPIC
Bilirubin Urine: NEGATIVE
Glucose, UA: NEGATIVE mg/dL
Hgb urine dipstick: NEGATIVE
Ketones, ur: NEGATIVE mg/dL
Nitrite: POSITIVE — AB
Protein, ur: NEGATIVE mg/dL
Specific Gravity, Urine: 1.016 (ref 1.005–1.030)
pH: 5 (ref 5.0–8.0)

## 2024-02-10 LAB — CBC WITH DIFFERENTIAL/PLATELET
Abs Immature Granulocytes: 0.1 K/uL — ABNORMAL HIGH (ref 0.00–0.07)
Basophils Absolute: 0 K/uL (ref 0.0–0.1)
Basophils Relative: 0 %
Eosinophils Absolute: 0.1 K/uL (ref 0.0–0.5)
Eosinophils Relative: 2 %
HCT: 21.5 % — ABNORMAL LOW (ref 36.0–46.0)
Hemoglobin: 5.5 g/dL — CL (ref 12.0–15.0)
Immature Granulocytes: 2 %
Lymphocytes Relative: 10 %
Lymphs Abs: 0.5 K/uL — ABNORMAL LOW (ref 0.7–4.0)
MCH: 25.9 pg — ABNORMAL LOW (ref 26.0–34.0)
MCHC: 25.6 g/dL — ABNORMAL LOW (ref 30.0–36.0)
MCV: 101.4 fL — ABNORMAL HIGH (ref 80.0–100.0)
Monocytes Absolute: 0.6 K/uL (ref 0.1–1.0)
Monocytes Relative: 12 %
Neutro Abs: 3.7 K/uL (ref 1.7–7.7)
Neutrophils Relative %: 74 %
Platelets: 75 K/uL — ABNORMAL LOW (ref 150–400)
RBC: 2.12 MIL/uL — ABNORMAL LOW (ref 3.87–5.11)
RDW: 19.9 % — ABNORMAL HIGH (ref 11.5–15.5)
WBC: 5 K/uL (ref 4.0–10.5)
nRBC: 0.4 % — ABNORMAL HIGH (ref 0.0–0.2)

## 2024-02-10 LAB — POC OCCULT BLOOD, ED: Fecal Occult Bld: POSITIVE — AB

## 2024-02-10 LAB — PREPARE RBC (CROSSMATCH)

## 2024-02-10 MED ORDER — HYDROMORPHONE HCL 1 MG/ML IJ SOLN
0.5000 mg | Freq: Once | INTRAMUSCULAR | Status: AC
  Administered 2024-02-10: 0.5 mg via INTRAVENOUS
  Filled 2024-02-10: qty 1

## 2024-02-10 MED ORDER — BUSPIRONE HCL 5 MG PO TABS
10.0000 mg | ORAL_TABLET | Freq: Two times a day (BID) | ORAL | Status: DC
  Administered 2024-02-10 – 2024-02-25 (×30): 10 mg via ORAL
  Filled 2024-02-10 (×30): qty 2

## 2024-02-10 MED ORDER — SODIUM CHLORIDE 0.9% FLUSH
3.0000 mL | INTRAVENOUS | Status: DC | PRN
  Administered 2024-02-10: 3 mL via INTRAVENOUS

## 2024-02-10 MED ORDER — ACETAMINOPHEN 325 MG PO TABS: 650.0000 mg | ORAL_TABLET | Freq: Four times a day (QID) | ORAL | Status: DC | PRN

## 2024-02-10 MED ORDER — DILTIAZEM HCL ER COATED BEADS 120 MG PO CP24
120.0000 mg | ORAL_CAPSULE | Freq: Every day | ORAL | Status: DC
  Administered 2024-02-10 – 2024-02-17 (×8): 120 mg via ORAL
  Filled 2024-02-10 (×8): qty 1

## 2024-02-10 MED ORDER — ALLOPURINOL 300 MG PO TABS
300.0000 mg | ORAL_TABLET | Freq: Every day | ORAL | Status: DC
  Administered 2024-02-10 – 2024-02-25 (×16): 300 mg via ORAL
  Filled 2024-02-10 (×16): qty 1

## 2024-02-10 MED ORDER — LEVOTHYROXINE SODIUM 100 MCG PO TABS
100.0000 ug | ORAL_TABLET | Freq: Every day | ORAL | Status: DC
  Administered 2024-02-11 – 2024-02-25 (×14): 100 ug via ORAL
  Filled 2024-02-10 (×14): qty 1

## 2024-02-10 MED ORDER — POTASSIUM CHLORIDE CRYS ER 20 MEQ PO TBCR
20.0000 meq | EXTENDED_RELEASE_TABLET | Freq: Every day | ORAL | Status: DC
  Administered 2024-02-10 – 2024-02-15 (×6): 20 meq via ORAL
  Filled 2024-02-10 (×6): qty 1

## 2024-02-10 MED ORDER — TORSEMIDE 20 MG PO TABS
20.0000 mg | ORAL_TABLET | Freq: Once | ORAL | Status: AC
  Administered 2024-02-10: 20 mg via ORAL
  Filled 2024-02-10: qty 1

## 2024-02-10 MED ORDER — FOLIC ACID 1 MG PO TABS: 1.0000 mg | ORAL_TABLET | Freq: Every day | ORAL | Status: DC

## 2024-02-10 MED ORDER — PANTOPRAZOLE SODIUM 40 MG IV SOLR
40.0000 mg | Freq: Once | INTRAVENOUS | Status: AC
  Administered 2024-02-10: 40 mg via INTRAVENOUS
  Filled 2024-02-10: qty 10

## 2024-02-10 MED ORDER — VITAMIN B-12 1000 MCG PO TABS
1000.0000 ug | ORAL_TABLET | Freq: Every day | ORAL | Status: DC
  Administered 2024-02-11 – 2024-02-25 (×15): 1000 ug via ORAL
  Filled 2024-02-10 (×15): qty 1

## 2024-02-10 MED ORDER — OXYCODONE HCL 5 MG PO TABS
5.0000 mg | ORAL_TABLET | ORAL | Status: DC | PRN
  Administered 2024-02-12: 5 mg via ORAL
  Filled 2024-02-10: qty 1

## 2024-02-10 MED ORDER — DULOXETINE HCL 20 MG PO CPEP
20.0000 mg | ORAL_CAPSULE | Freq: Every day | ORAL | Status: DC
  Administered 2024-02-10 – 2024-02-25 (×16): 20 mg via ORAL
  Filled 2024-02-10 (×16): qty 1

## 2024-02-10 MED ORDER — ONDANSETRON HCL 4 MG/2ML IJ SOLN: 4.0000 mg | Freq: Four times a day (QID) | INTRAMUSCULAR | Status: DC | PRN

## 2024-02-10 MED ORDER — SODIUM CHLORIDE 0.9% FLUSH
3.0000 mL | Freq: Two times a day (BID) | INTRAVENOUS | Status: DC
  Administered 2024-02-10 – 2024-02-25 (×26): 3 mL via INTRAVENOUS

## 2024-02-10 MED ORDER — SODIUM CHLORIDE 0.9% IV SOLUTION: Freq: Once | INTRAVENOUS | Status: AC

## 2024-02-10 MED ORDER — CEPHALEXIN 500 MG PO CAPS
500.0000 mg | ORAL_CAPSULE | Freq: Once | ORAL | Status: AC
Start: 2024-02-10 — End: 2024-02-10
  Administered 2024-02-10: 500 mg via ORAL
  Filled 2024-02-10: qty 1

## 2024-02-10 MED ORDER — METHOCARBAMOL 500 MG PO TABS
500.0000 mg | ORAL_TABLET | Freq: Four times a day (QID) | ORAL | Status: DC | PRN
  Administered 2024-02-19 – 2024-02-21 (×6): 500 mg via ORAL
  Filled 2024-02-10 (×6): qty 1

## 2024-02-10 MED ORDER — SODIUM CHLORIDE 0.9% FLUSH
3.0000 mL | Freq: Two times a day (BID) | INTRAVENOUS | Status: DC
  Administered 2024-02-10 – 2024-02-25 (×14): 3 mL via INTRAVENOUS

## 2024-02-10 MED ORDER — AMIODARONE HCL 200 MG PO TABS
200.0000 mg | ORAL_TABLET | Freq: Every day | ORAL | Status: DC
  Administered 2024-02-10 – 2024-02-19 (×10): 200 mg via ORAL
  Filled 2024-02-10 (×10): qty 1

## 2024-02-10 MED ORDER — ONDANSETRON HCL 4 MG PO TABS: 4.0000 mg | ORAL_TABLET | Freq: Four times a day (QID) | ORAL | Status: DC | PRN

## 2024-02-10 MED ORDER — HYDROMORPHONE HCL 1 MG/ML IJ SOLN
0.5000 mg | INTRAMUSCULAR | Status: DC | PRN
  Administered 2024-02-10 – 2024-02-12 (×9): 1 mg via INTRAVENOUS
  Filled 2024-02-10 (×9): qty 1

## 2024-02-10 MED ORDER — SODIUM CHLORIDE 0.9 % IV SOLN: 250.0000 mL | INTRAVENOUS | Status: AC | PRN

## 2024-02-10 MED ORDER — ACETAMINOPHEN 650 MG RE SUPP: 650.0000 mg | Freq: Four times a day (QID) | RECTAL | Status: DC | PRN

## 2024-02-10 MED ORDER — APIXABAN 2.5 MG PO TABS
2.5000 mg | ORAL_TABLET | Freq: Two times a day (BID) | ORAL | Status: DC
  Administered 2024-02-10 – 2024-02-11 (×2): 2.5 mg via ORAL
  Filled 2024-02-10 (×2): qty 1

## 2024-02-10 NOTE — ED Provider Notes (Cosign Needed Addendum)
  EMERGENCY DEPARTMENT AT [facility] Provider Note   CSN: [id number] Arrival date & time: 02/10/24  9263     Patient presents with: Back Pain   Peggy Armstrong is a 78 y.o. female who presents to the emergency department with a chief complaint of back pain.  Patient states that she has had back pain for approximately 3 days but denies trauma or injury.  States that she has had physical therapy most recently yesterday which worsened the pain.  Patient has a past medical history significant for duodenal adenocarcinoma, rheumatoid arthritis, iron  deficiency anemia, atrial flutter, CKD stage III, hypertension, chronic diastolic CHF, AAA, hyperlipidemia, GI bleed, hemorrhagic shock, etc.    Back Pain      Prior to Admission medications   Medication Sig Start Date End Date Taking? Authorizing Provider  acetaminophen  (TYLENOL ) 325 MG tablet Take 650 mg by mouth every 6 (six) hours as needed for mild pain (pain score 1-3) or moderate pain (pain score 4-6).    [provider]  allopurinol  (ZYLOPRIM ) 300 MG tablet Take 1 tablet (300 mg total) by mouth daily. 07/18/23   Rothfuss, Jacob T, PA-C  amiodarone  (PACERONE ) 200 MG tablet Take 1 tablet (200 mg total) by mouth daily. 09/01/23   Croitoru, Mihai, MD  atorvastatin  (LIPITOR) 20 MG tablet Take 1 tablet (20 mg total) by mouth daily. 12/19/22   Vannie Reche RAMAN, NP  busPIRone  (BUSPAR ) 10 MG tablet TAKE 1 TABLET BY MOUTH TWICE A DAY 08/22/23   Rothfuss, Jacob T, PA-C  Calcium  Carb-Cholecalciferol  (CALCIUM  600 + D PO) Take 1 tablet by mouth in the morning.    [provider]  cyanocobalamin  (VITAMIN B12) 1000 MCG tablet Take 1,000 mcg by mouth daily. Patient taking differently: Take 500 mcg by mouth daily.    [provider]  diltiazem  (CARDIZEM  CD) 120 MG 24 hr capsule Take 120 mg by mouth daily.    [provider]  DULoxetine  (CYMBALTA ) 20 MG capsule TAKE 1 CAPSULE BY MOUTH EVERY DAY 10/23/23    Rothfuss, Jacob T, PA-C  ELIQUIS  5 MG TABS tablet Take 1 tablet (5 mg total) by mouth 2 (two) times daily. Patient taking differently: Take 2.5 mg by mouth 2 (two) times daily. 10/29/23   Vernon Ranks, MD  ferrous sulfate  325 (65 FE) MG tablet Take 325 mg by mouth in the morning and at bedtime.    [provider]  folic acid  (FOLVITE ) 1 MG tablet Take 1 tablet (1 mg total) by mouth daily. 05/18/23   Krishnan, Gokul, MD  hydrocortisone cream 1 % Apply 1 Application topically 2 (two) times daily. 01/05/24   [provider]  ipratropium-albuterol  (DUONEB) 0.5-2.5 (3) MG/3ML SOLN Take 3 mLs by nebulization every 6 (six) hours as needed (for shortness of breath or wheezing).    [provider]  KLOR-CON  M20 20 MEQ tablet Take 20 mEq by mouth in the morning and at bedtime.    [provider]  levothyroxine  (SYNTHROID ) 100 MCG tablet Take 1 tablet (100 mcg total) by mouth daily before breakfast. 07/18/23   Rothfuss, Jacob T, PA-C  melatonin 3 MG TABS tablet Take 1 tablet (3 mg total) by mouth at bedtime as needed (insomnia). 05/18/23   Krishnan, Gokul, MD  methocarbamol  (ROBAXIN ) 500 MG tablet TAKE 1 TABLET BY MOUTH EVERY 6 HOURS AS NEEDED FOR MUSCLE SPASMS. 10/03/23   Rothfuss, Jacob T, PA-C  ondansetron  (ZOFRAN ) 4 MG tablet Take 4 mg by mouth every 6 (six)  hours as needed for nausea or vomiting.    [provider]  pantoprazole  (PROTONIX ) 40 MG tablet Take 1 tablet (40 mg total) by mouth daily. 07/18/23   Rothfuss, Jacob T, PA-C  torsemide  (DEMADEX ) 20 MG tablet Take Torsemide  on alternating days40 mg one day (2 tablets) then the next day 20 mg (1 tablet). Patient taking differently: Take 20 mg by mouth once. 09/21/23   Madireddy, Alean SAUNDERS, MD  traMADol  (ULTRAM ) 50 MG tablet Take 1 tablet (50 mg total) by mouth every 8 (eight) hours as needed. 02/01/24   Rothfuss, Jacob T, PA-C    Allergies: Codeine and Tape    Review of Systems  Musculoskeletal:  Positive for  back pain.    Updated Vital Signs BP 133/86   Pulse 71   Temp 97.8 F (36.6 C) (Oral)   Resp 14   Ht 5' (1.524 m)   Wt 89.3 kg   SpO2 98%   BMI 38.45 kg/m   Physical Exam Vitals and nursing note reviewed.  Constitutional:      General: She is awake. She is not in acute distress.    Appearance: Normal appearance. She is not ill-appearing, toxic-appearing or diaphoretic.  HENT:     Head: Normocephalic and atraumatic.  Eyes:     General: No scleral icterus. Cardiovascular:     Rate and Rhythm: Normal rate and regular rhythm.  Pulmonary:     Effort: Pulmonary effort is normal. No respiratory distress.     Breath sounds: No wheezing, rhonchi or rales.  Musculoskeletal:     Cervical back: Normal range of motion.     Right lower leg: No edema.     Left lower leg: No edema.     Comments: Lumbar paraspinal muscles and lumbar spine tender with palpation. Patient able to lean forward and sit up in bed.   Skin:    General: Skin is warm.     Capillary Refill: Capillary refill takes less than 2 seconds.  Neurological:     General: No focal deficit present.     Mental Status: She is alert and oriented to person, place, and time.  Psychiatric:        Mood and Affect: Mood normal.        Behavior: Behavior normal. Behavior is cooperative.     (all labs ordered are listed, but only abnormal results are displayed) Labs Reviewed  CBC WITH DIFFERENTIAL/PLATELET - Abnormal; Notable for the following components:      Result Value   RBC 2.12 (*)    Hemoglobin 5.5 (*)    HCT 21.5 (*)    MCV 101.4 (*)    MCH 25.9 (*)    MCHC 25.6 (*)    RDW 19.9 (*)    Platelets 75 (*)    nRBC 0.4 (*)    Lymphs Abs 0.5 (*)    Abs Immature Granulocytes 0.10 (*)    All other components within normal limits  COMPREHENSIVE METABOLIC PANEL WITH GFR - Abnormal; Notable for the following components:   BUN 29 (*)    Creatinine, Ser 1.05 (*)    Calcium  8.1 (*)    Total Protein 5.7 (*)    Albumin 2.4  (*)    Alkaline Phosphatase 187 (*)    GFR, Estimated 54 (*)    All other components within normal limits  URINALYSIS, ROUTINE W REFLEX MICROSCOPIC - Abnormal; Notable for the following components:   Nitrite POSITIVE (*)    Leukocytes,Ua TRACE (*)  Bacteria, UA FEW (*)    All other components within normal limits  POC OCCULT BLOOD, ED - Abnormal; Notable for the following components:   Fecal Occult Bld POSITIVE (*)    All other components within normal limits  URINE CULTURE  TYPE AND SCREEN  PREPARE RBC (CROSSMATCH)    EKG: None  Radiology: CT Lumbar Spine Wo Contrast Result Date: 02/10/2024 CLINICAL DATA:  Low back pain.  History of duodenal adenocarcinoma. EXAM: CT LUMBAR SPINE WITHOUT CONTRAST TECHNIQUE: Multidetector CT imaging of the lumbar spine was performed without intravenous contrast administration. Multiplanar CT image reconstructions were also generated. RADIATION DOSE REDUCTION: This exam was performed according to the departmental dose-optimization program which includes automated exposure control, adjustment of the mA and/or kV according to patient size and/or use of iterative reconstruction technique. COMPARISON:  Lumbar spine CT 05/11/2023 and MRI 05/13/2023. Outside CT abdomen and pelvis [facility]/20/2023 from [facility]. FINDINGS: Segmentation: 5 lumbar type vertebrae. Alignment: Mild lumbar dextroscoliosis. Chronic grade 1 anterolisthesis of L4 on L5. Vertebrae: Chronic L2 burst fracture with severe central vertebral body height loss and 6 mm retropulsion, similar to the 10/20/2023 CT. Mild L3 compression fracture with 25% vertebral body height loss, new from 05/11/2023 and with mildly progressive height loss since 10/20/2023. New, likely acute L1 compression fracture with 25% height loss and no retropulsion. Hemangiomas in the T12 and L3 vertebral bodies. Old sacral insufficiency fractures. Old posterior right twelfth rib fracture. Paraspinal and other soft tissues: Aortic  atherosclerosis. Subcutaneous edema throughout the back. Partially visualized small bowel lipoma. Partially visualized embolization coils in the region of the duodenum and pancreatic head. Disc levels: L2 retropulsion is again noted to result in moderate spinal stenosis. Mild spinal stenosis at L4-5. IMPRESSION: 1. Acute L1 compression fracture. 2. Mild L3 compression fracture with progressive height loss since 10/20/2023. 3. 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 4.  Aortic Atherosclerosis (ICD10-I70.0). Electronically Signed   By: Dasie Hamburg M.D.   On: 02/10/2024 13:03     .Critical Care  Performed by: Janetta Terrall FALCON, PA-C Authorized by: Janetta Terrall FALCON, PA-C   Critical care provider statement:    Critical care time (minutes):  30   Critical care was necessary to treat or prevent imminent or life-threatening deterioration of the following conditions:  Circulatory failure   Critical care was time spent personally by me on the following activities:  Development of treatment plan with patient or surrogate, discussions with consultants, evaluation of patient's response to treatment, examination of patient, ordering and review of laboratory studies, ordering and review of radiographic studies, ordering and performing treatments and interventions, pulse oximetry, re-evaluation of patient's condition, review of old charts and obtaining history from patient or surrogate Comments:     Blood transfusion ordered and patient admitted for hemoglobin of 5.5 with positive hemmocult     Medications Ordered in the ED  allopurinol  (ZYLOPRIM ) tablet 300 mg (has no administration in time range)  amiodarone  (PACERONE ) tablet 200 mg (has no administration in time range)  diltiazem  (CARDIZEM  CD) 24 hr capsule 120 mg (has no administration in time range)  torsemide  (DEMADEX ) tablet 20 mg (has no administration in time range)  busPIRone  (BUSPAR ) tablet 10 mg (has no  administration in time range)  DULoxetine  (CYMBALTA ) DR capsule 20 mg (has no administration in time range)  levothyroxine  (SYNTHROID ) tablet 100 mcg (has no administration in time range)  apixaban  (ELIQUIS ) tablet 2.5 mg (has no administration in time range)  cyanocobalamin  (  VITAMIN B12) tablet 1,000 mcg (has no administration in time range)  folic acid  (FOLVITE ) tablet 1 mg (has no administration in time range)  methocarbamol  (ROBAXIN ) tablet 500 mg (has no administration in time range)  potassium chloride  SA (KLOR-CON  M) CR tablet 20 mEq (has no administration in time range)  sodium chloride  flush (NS) 0.9 % injection 3 mL (has no administration in time range)  sodium chloride  flush (NS) 0.9 % injection 3 mL (has no administration in time range)  sodium chloride  flush (NS) 0.9 % injection 3 mL (has no administration in time range)  0.9 %  sodium chloride  infusion (has no administration in time range)  acetaminophen  (TYLENOL ) tablet 650 mg (has no administration in time range)    Or  acetaminophen  (TYLENOL ) suppository 650 mg (has no administration in time range)  oxyCODONE  (Oxy IR/ROXICODONE ) immediate release tablet 5 mg (has no administration in time range)  HYDROmorphone  (DILAUDID ) injection 0.5-1 mg (has no administration in time range)  ondansetron  (ZOFRAN ) tablet 4 mg (has no administration in time range)    Or  ondansetron  (ZOFRAN ) injection 4 mg (has no administration in time range)  HYDROmorphone  (DILAUDID ) injection 0.5 mg (0.5 mg Intravenous Given 02/10/24 1045)  cephALEXin  (KEFLEX ) capsule 500 mg (500 mg Oral Given 02/10/24 1014)  0.9 %  sodium chloride  infusion (Manually program via Guardrails IV Fluids) ( Intravenous New Bag/Given 02/10/24 1322)  pantoprazole  (PROTONIX ) injection 40 mg (40 mg Intravenous Given 02/10/24 1201)                                    Medical Decision Making Amount and/or Complexity of Data Reviewed Labs: ordered. Radiology:  ordered.  Risk Prescription drug management. Decision regarding hospitalization.   Patient presents to the ED for concern of back pain, this involves an extensive number of treatment options, and is a complaint that carries with it a high risk of complications and morbidity.  The differential diagnosis includes fracture, muscle strain, abscess, cauda equina syndrome, malignancy, etc.   Co morbidities that complicate the patient evaluation  duodenal adenocarcinoma, rheumatoid arthritis, iron  deficiency anemia, atrial flutter, CKD stage III, hypertension, chronic diastolic CHF, AAA, hyperlipidemia, GI bleed, hemorrhagic shock   Additional history obtained:  Briefly reviewed chart where patient was previously seen for gastrointestinal hemorrhage by [facility] as well as [facility], also briefly reviewed duodenal cancer history  Lab Tests:  I Ordered, and personally interpreted labs.  The pertinent results include: CBC-hemoglobin 5.5, CMP-creatinine 1.05, urinalysis nitrite positive, leukocyte esterase trace, few bacteria, fecal occult blood positive collected by myself   Imaging Studies ordered:  I ordered imaging studies including CT lumbar spine without contrast I independently visualized and interpreted imaging which showed acute L1 compression fracture as well as chronic L3 compression fracture as well as chronic L2 burst fracture resulting in retropulsion and moderate spinal stenosis I agree with the radiologist interpretation   Cardiac Monitoring:  The patient was maintained on a cardiac monitor.  I personally viewed and interpreted the cardiac monitored which showed an underlying rhythm of: Sinus rhythm   Medicines ordered and prescription drug management:  I ordered medication including Dilaudid  for pain, Keflex  for UTI, Protonix  for possible GI bleed Reevaluation of the patient after these medicines showed that the patient stayed the same I have reviewed the patients  home medicines and have made adjustments as needed   Test Considered:  CTA of chest abdomen  pelvis: Declined at this time as patient does not appear to be actively hemorrhaging, stable vital signs, patient denies hematemesis or gross blood in stool, however we do have a positive Hemoccult with a hemoglobin of 5.5, will continue to monitor this blood, I see no indication for emergent CT angiogram at this time   Critical Interventions:  Ordered blood to this patient due to hemoglobin of 5.5   Consultations Obtained:  I requested consultation with the hospitalist (Dr. Jadine), also spoke with Greig Sams from [facility] who states they will see once admitted,  and discussed lab and imaging findings as well as pertinent plan - they recommend: admission for ongoing diagnosis and treatment   Problem List / ED Course:  78 year old female initially presenting with back pain, extensive past medical history including GI hemorrhage resulting in hemorrhagic shock Due to patient concern for kidney infection lab work ordered as well as urinalysis, CT of lumbar spine however back pain is atraumatic per patient CT of lumbar spine significant for acute L1 compression fracture along with other chronic findings, lab work significant for hemoglobin of 5.5, possibly UTI, patient does appreciate bladder tenderness Patient denies hematemesis, hematuria, or gross blood in stool Dose of Keflex  ordered for UTI, blood transfusion ordered for hemoglobin of 5.5, Dilaudid  ordered for pain Spoke with Dr. Jadine who agrees with admission for this patient Patient admitted to the [facility]  hospitalist service for ongoing diagnosis and treatment of UTI, acute compression fracture, anemia with possible GI complication Spoke with Amy Easterwood from [facility] who states their service will see patient once admitted. Most likely diagnosis at time is acute L1 compression fracture along with other chronic lumbar spine  changes, acute anemia with possible GI etiology, urinary tract infection   Reevaluation:  After the interventions noted above, I reevaluated the patient and found that they have :stayed the same   Social Determinants of Health:  none   Dispostion:  After consideration of the diagnostic results and the patients response to treatment, I feel that the patent would benefit from admission to hospital for ongoing diagnosis and treatment.   Please make your primary care provider aware of all of your findings and workup today in the emergency department as well as your entire hospital stay.     Final diagnoses:  Acute cystitis without hematuria  Closed compression fracture of body of L1 vertebra (HCC)  Anemia, unspecified type    ED Discharge Orders     None          Janetta Terrall FALCON, PA-C 02/10/24 1625    Inayah Woodin F, PA-C 02/10/24 1721    Gennaro Bouchard L, DO 02/12/24 2132

## 2024-02-10 NOTE — H&P (Signed)
 History and Physical    Patient: Peggy Armstrong FMW:[id number] DOB: 19-Aug-1945 DOA: 02/10/2024 DOS: the patient was seen and examined on 02/10/2024 PCP: Rothfuss, [name] DASEN, PA-C  Patient coming from: Home  Chief Complaint:  Chief Complaint  Patient presents with   Back Pain   HPI:  78 year old woman complex PMH including inoperable duodenal adenocarcinoma, rheumatoid arthritis, CKD, multiple spontaneous vertebral compression fractures, who presented to the Emergency Department for new back pain.  Evaluation was notable for profound anemia and CT showed acute L1 compression fracture.  She was admitted for transfusion, pain control.  GI was consulted.  Patient lives with daughter and granddaughter, she has been doing okay but over the last 3 days has had increasing low back pain, it became quite severe last evening.  She reports multiple compression fractures spontaneously last year from which she recovered regarding symptoms.  She has had no bleeding that she is aware of.  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   Cellulitis of left arm 08/14/2023   Cholangitis    Choledocholithiasis 03/22/2017   Chronic diastolic CHF (congestive heart failure) (HCC) 11/08/2019   Chronic low back pain 07/18/2023   CKD (chronic kidney disease) stage 3, GFR 30-59 ml/min (HCC) 06/09/2017   CKD (chronic kidney disease), stage III (HCC) 06/09/2017   Closed compression fracture of L2 lumbar vertebra, initial encounter (HCC) 05/11/2023   Clotting disorder (HCC)    right DVT     Dehydration  05/12/2023   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   Duodenal mass    Duodenal ulcer    DVT (deep venous thrombosis) (HCC)    right DVT   Epigastric pain    Essential hypertension    Fall at home, initial encounter 10/21/2023   GAD (generalized anxiety disorder)    Gastric polyps 05/16/2023   Generalized weakness 05/12/2023   GI bleed 07/20/2023   Gout    Gout    Hematemesis 11/05/2017   Hemorrhagic shock (HCC) 07/21/2023   History of depression 10/21/2023   History of DVT (deep vein thrombosis) 11/08/2019   History of GI bleed 10/21/2023   Hyperlipidemia 05/12/2023   Hypertension    Hypokalemia 07/03/2017   Hypothyroidism 05/12/2023   Iron  deficiency anemia 08/02/2016   Left elbow avulsion fracture 10/21/2023   Left humeral fracture 10/21/2023   Long term current use of anticoagulant therapy 06/20/2023   Macrocytic anemia 11/05/2017   Melena 11/05/2017   Morbid obesity (HCC) 11/08/2019   Nausea & vomiting    Need for immunization against influenza 07/18/2019   Need for vaccination 04/23/2018   Occult blood in stools 05/16/2023   Paroxysmal atrial fibrillation (HCC) 05/12/2023   Peri-ampullary neoplasm    Poor dentition 08/14/2016   RA (rheumatoid arthritis) (HCC)    Restless leg 12/02/2022   Right shoulder pain 07/26/2018   Routine general medical examination at a health care facility 12/13/2019   S/P ERCP 05/05/2017   SIRS (systemic inflammatory response syndrome) (HCC) 03/22/2017   Upper GI bleed 06/20/2023   Past Surgical History:  Procedure Laterality  Date   ABDOMINAL HYSTERECTOMY     BIOPSY  05/16/2023   Procedure: BIOPSY;  Surgeon: Aneita Gwendlyn DASEN, MD;  Location: [facility];  Service: Gastroenterology;;   CHOLECYSTECTOMY     ENDOSCOPIC RETROGRADE CHOLANGIOPANCREATOGRAPHY (ERCP) WITH PROPOFOL  N/A 06/15/2017   Procedure: ENDOSCOPIC RETROGRADE CHOLANGIOPANCREATOGRAPHY (ERCP) WITH PROPOFOL ;  Surgeon: Teressa Toribio SQUIBB, MD;  Location: [facility];   Service: Endoscopy;  Laterality: N/A;   ESOPHAGOGASTRODUODENOSCOPY (EGD) WITH PROPOFOL  N/A 03/24/2017   Procedure: ESOPHAGOGASTRODUODENOSCOPY (EGD) WITH PROPOFOL ;  Surgeon: Legrand Victory LITTIE DOUGLAS, MD;  Location: [facility];  Service: Gastroenterology;  Laterality: N/A;   ESOPHAGOGASTRODUODENOSCOPY (EGD) WITH PROPOFOL  N/A 11/07/2017   Procedure: ESOPHAGOGASTRODUODENOSCOPY (EGD) WITH PROPOFOL ;  Surgeon: Teressa Toribio SQUIBB, MD;  Location: [facility];  Service: Endoscopy;  Laterality: N/A;   ESOPHAGOGASTRODUODENOSCOPY (EGD) WITH PROPOFOL  N/A 05/16/2023   Procedure: ESOPHAGOGASTRODUODENOSCOPY (EGD) WITH PROPOFOL ;  Surgeon: Aneita Gwendlyn DASEN, MD;  Location: [facility];  Service: Gastroenterology;  Laterality: N/A;   ESOPHAGOGASTRODUODENOSCOPY (EGD) WITH PROPOFOL  N/A 07/21/2023   Procedure: ESOPHAGOGASTRODUODENOSCOPY (EGD) WITH PROPOFOL ;  Surgeon: Charlanne Groom, MD;  Location[facility];  Service: Gastroenterology;  Laterality: N/A;   EUS N/A 04/06/2017   Procedure: UPPER ENDOSCOPIC ULTRASOUND (EUS) LINEAR;  Surgeon: Teressa Toribio SQUIBB, MD;  Location: [facility];  Service: Endoscopy;  Laterality: N/A;  to evaluate duodenal mass   HEMOSTASIS CLIP PLACEMENT  07/21/2023   Procedure: HEMOSTASIS CLIP PLACEMENT;  Surgeon: Charlanne Groom, MD;  Location: [facility];  Service: Gastroenterology;;   HEMOSTASIS CONTROL  07/21/2023   Procedure: HEMOSTASIS CONTROL;  Surgeon: Charlanne Groom, MD;  Location: [facility] ENDOSCOPY;  Service: Gastroenterology;;   HERNIA REPAIR     IR ANGIOGRAM VISCERAL SELECTIVE  07/22/2023   IR BILIARY DRAIN PLACEMENT WITH CHOLANGIOGRAM  03/25/2017   IR CONVERT BILIARY DRAIN TO INT EXT BILIARY DRAIN  04/03/2017   IR EMBO ART  VEN HEMORR LYMPH EXTRAV  INC GUIDE ROADMAPPING  07/22/2023   IR US  GUIDE VASC ACCESS RIGHT  07/22/2023   POLYPECTOMY  05/16/2023   Procedure: POLYPECTOMY;  Surgeon: Aneita Gwendlyn DASEN, MD;  Location: [facility];  Service: Gastroenterology;;   Social History:  reports that she  has quit smoking. Her smoking use included cigarettes. She has been exposed to tobacco smoke. She has quit using smokeless tobacco. She reports that she does not drink alcohol and does not use drugs.  Allergies  Allergen Reactions   Codeine Nausea And Vomiting and Other (See Comments)    Made me very sick   Tape Other (See Comments)    Tears skin, as it is VERY THIN!!  - surgical tape    Family History  Problem Relation Age of Onset   Hypertension Mother    Prostate cancer Father    Colon cancer Father    Hypertension Brother    Rheum arthritis Maternal Grandmother    Diabetes Paternal Grandmother    Heart disease Paternal Grandfather    Colon cancer Child     Prior to Admission medications   Medication Sig Start Date End Date Taking? Authorizing Provider  acetaminophen  (TYLENOL ) 325 MG tablet Take 650 mg by mouth every 6 (six) hours as needed for mild pain (pain score 1-3) or moderate pain (pain score 4-6).   Yes [provider]  albuterol  (VENTOLIN  HFA) 108 (90 Base) MCG/ACT inhaler Inhale 1-2 puffs into the lungs every 6 (six) hours as needed for wheezing or shortness of breath.   Yes [provider]  allopurinol  (ZYLOPRIM ) 300 MG tablet Take 1 tablet (  300 mg total) by mouth daily. 07/18/23  Yes Rothfuss, Jacob T, PA-C  amiodarone  (PACERONE ) 200 MG tablet Take 1 tablet (200 mg total) by mouth daily. 09/01/23  Yes Croitoru, Mihai, MD  Ascorbic Acid (VITAMIN C PO) Take 1 tablet by mouth in the morning.   Yes [provider]  atorvastatin  (LIPITOR) 20 MG tablet Take 1 tablet (20 mg total) by mouth daily. 12/19/22  Yes Vannie Reche RAMAN, NP  busPIRone  (BUSPAR ) 10 MG tablet TAKE 1 TABLET BY MOUTH TWICE A DAY 08/22/23  Yes Rothfuss, Jacob T, PA-C  Cholecalciferol  (VITAMIN D3 PO) Take 1 capsule by mouth in the morning.   Yes [provider]  cyanocobalamin  (VITAMIN B12) 1000 MCG tablet Take 1,000 mcg by mouth daily. Patient taking differently: Take  500-1,000 mcg by mouth daily.   Yes [provider]  diclofenac (VOLTAREN) 50 MG EC tablet Take 50 mg by mouth 2 (two) times daily with a meal.   Yes [provider]  diltiazem  (CARDIZEM  CD) 120 MG 24 hr capsule Take 120 mg by mouth daily.   Yes [provider]  DULoxetine  (CYMBALTA ) 20 MG capsule TAKE 1 CAPSULE BY MOUTH EVERY DAY 10/23/23  Yes Rothfuss, Jacob T, PA-C  ELIQUIS  2.5 MG TABS tablet Take 2.5 mg by mouth in the morning and at bedtime.   Yes [provider]  ferrous sulfate  325 (65 FE) MG tablet Take 325 mg by mouth daily with breakfast.   Yes [provider]  ipratropium-albuterol  (DUONEB) 0.5-2.5 (3) MG/3ML SOLN Take 3 mLs by nebulization every 6 (six) hours as needed (for shortness of breath or wheezing).   Yes [provider]  KLOR-CON  M20 20 MEQ tablet Take 20 mEq by mouth in the morning and at bedtime.   Yes [provider]  levothyroxine  (SYNTHROID ) 100 MCG tablet Take 1 tablet (100 mcg total) by mouth daily before breakfast. 07/18/23  Yes Rothfuss, Jacob T, PA-C  melatonin 3 MG TABS tablet Take 1 tablet (3 mg total) by mouth at bedtime as needed (insomnia). Patient taking differently: Take 3 mg by mouth at bedtime. 05/18/23  Yes Krishnan, Gokul, MD  methocarbamol  (ROBAXIN ) 500 MG tablet TAKE 1 TABLET BY MOUTH EVERY 6 HOURS AS NEEDED FOR MUSCLE SPASMS. 10/03/23  Yes Rothfuss, Jacob T, PA-C  ondansetron  (ZOFRAN ) 4 MG tablet Take 4 mg by mouth every 6 (six) hours as needed for nausea or vomiting.   Yes [provider]  pantoprazole  (PROTONIX ) 40 MG tablet Take 1 tablet (40 mg total) by mouth daily. Patient taking differently: Take 40 mg by mouth daily before breakfast. 07/18/23  Yes Rothfuss, Jacob T, PA-C  sertraline  (ZOLOFT ) 25 MG tablet Take 25 mg by mouth daily.   Yes [provider]  torsemide  (DEMADEX ) 20 MG tablet Take Torsemide  on alternating days40 mg one day (2 tablets) then the next day 20 mg (1  tablet). Patient taking differently: Take 20 mg by mouth in the morning. 09/21/23  Yes Madireddy, Alean SAUNDERS, MD  traMADol  (ULTRAM ) 50 MG tablet Take 1 tablet (50 mg total) by mouth every 8 (eight) hours as needed. Patient taking differently: Take 50 mg by mouth every 8 (eight) hours as needed (for pain). 02/01/24  Yes Rothfuss, Jacob T, PA-C  Calcium  Carb-Cholecalciferol  (CALCIUM  600 + D PO) Take 1 tablet by mouth in the morning.    [provider]  ELIQUIS  5 MG TABS tablet Take 1 tablet (5 mg total) by mouth 2 (two) times daily. Patient not  taking: Reported on 02/10/2024 10/29/23   Vernon Ranks, MD  folic acid  (FOLVITE ) 1 MG tablet Take 1 tablet (1 mg total) by mouth daily. Patient not taking: Reported on 02/10/2024 05/18/23   Verdene Purchase, MD    Physical Exam: Vitals:   02/10/24 1506 02/10/24 1545 02/10/24 1806 02/10/24 1823  BP: (!) 155/97 133/86 (!) 146/97 (!) 121/57  Pulse: 72 71 78 76  Resp: 14 14 16 16   Temp: 97.6 F (36.4 C) 97.8 F (36.6 C) 98.8 F (37.1 C) (!) 97.4 F (36.3 C)  TempSrc: Oral Oral Oral Oral  SpO2:  98% 97% 98%  Weight: 89.3 kg     Height: 5' (1.524 m)      Physical Exam Vitals reviewed.  Constitutional:      General: She is not in acute distress.    Appearance: She is not ill-appearing or toxic-appearing.  Cardiovascular:     Rate and Rhythm: Normal rate and regular rhythm.     Heart sounds: No murmur heard. Pulmonary:     Effort: Pulmonary effort is normal. No respiratory distress.     Breath sounds: No wheezing, rhonchi or rales.  Neurological:     Mental Status: She is alert.  Psychiatric:        Mood and Affect: Mood normal.        Behavior: Behavior normal.     Data Reviewed: CBG stable CMP notable for mild elevation of alkaline phosphatase. Hemoglobin 5.5, platelets 75.  Platelets in the 70s to 90s for much of the year.  Assessment and Plan: Acute on chronic anemia, macrocytic, asymptomatic Severe anemia hemoglobin 5.5.   Transfused 2 units 8/16.  Repeat CBC and trend. GI consultation.  Acute L1 compression fracture Pain control, early ambulation.  Duodenal adenocarcinoma Follow-up with primary oncologist in [place] as an outpatient  Acquired hypothyroidism Continue levothyroxine   Atypical atrial flutter Continue amiodarone , apixaban , diltiazem   Chronic diastolic CHF Appears stable   CKD stage IIIa Creatinine appears to be at baseline around 1.05 or better.   Advance Care Planning: Full, confirmed  Consults: GI  Family Communication: none  Severity of Illness: The appropriate patient status for this patient is OBSERVATION. Observation status is judged to be reasonable and necessary in order to provide the required intensity of service to ensure the patient's safety. The patient's presenting symptoms, physical exam findings, and initial radiographic and laboratory data in the context of their medical condition is felt to place them at decreased risk for further clinical deterioration. Furthermore, it is anticipated that the patient will be medically stable for discharge from the hospital within 2 midnights of admission.   Author: Toribio Door, MD 02/10/2024 8:36 PM  For on call review [email or web address].

## 2024-02-10 NOTE — ED Triage Notes (Signed)
 Pt bib [facility] from [facility] with back pain X3 days. Denies falls/injury. Pt had physical therapy yesterday which worsened pain.   142 palp 79 HR  99% RA

## 2024-02-11 DIAGNOSIS — R531 Weakness: Secondary | ICD-10-CM | POA: Diagnosis not present

## 2024-02-11 DIAGNOSIS — Z79899 Other long term (current) drug therapy: Secondary | ICD-10-CM | POA: Diagnosis not present

## 2024-02-11 DIAGNOSIS — E039 Hypothyroidism, unspecified: Secondary | ICD-10-CM | POA: Diagnosis not present

## 2024-02-11 DIAGNOSIS — I130 Hypertensive heart and chronic kidney disease with heart failure and stage 1 through stage 4 chronic kidney disease, or unspecified chronic kidney disease: Secondary | ICD-10-CM | POA: Diagnosis not present

## 2024-02-11 DIAGNOSIS — E785 Hyperlipidemia, unspecified: Secondary | ICD-10-CM | POA: Diagnosis not present

## 2024-02-11 DIAGNOSIS — S42202A Unspecified fracture of upper end of left humerus, initial encounter for closed fracture: Secondary | ICD-10-CM | POA: Diagnosis not present

## 2024-02-11 DIAGNOSIS — Z7401 Bed confinement status: Secondary | ICD-10-CM | POA: Diagnosis not present

## 2024-02-11 DIAGNOSIS — I484 Atypical atrial flutter: Secondary | ICD-10-CM | POA: Diagnosis not present

## 2024-02-11 DIAGNOSIS — K3189 Other diseases of stomach and duodenum: Secondary | ICD-10-CM | POA: Diagnosis not present

## 2024-02-11 DIAGNOSIS — R52 Pain, unspecified: Secondary | ICD-10-CM | POA: Diagnosis not present

## 2024-02-11 DIAGNOSIS — S32019A Unspecified fracture of first lumbar vertebra, initial encounter for closed fracture: Secondary | ICD-10-CM | POA: Diagnosis not present

## 2024-02-11 DIAGNOSIS — M6281 Muscle weakness (generalized): Secondary | ICD-10-CM | POA: Diagnosis not present

## 2024-02-11 DIAGNOSIS — Z6838 Body mass index (BMI) 38.0-38.9, adult: Secondary | ICD-10-CM | POA: Diagnosis not present

## 2024-02-11 DIAGNOSIS — R0989 Other specified symptoms and signs involving the circulatory and respiratory systems: Secondary | ICD-10-CM | POA: Diagnosis not present

## 2024-02-11 DIAGNOSIS — Z7901 Long term (current) use of anticoagulants: Secondary | ICD-10-CM | POA: Diagnosis not present

## 2024-02-11 DIAGNOSIS — R1312 Dysphagia, oropharyngeal phase: Secondary | ICD-10-CM | POA: Diagnosis not present

## 2024-02-11 DIAGNOSIS — G2581 Restless legs syndrome: Secondary | ICD-10-CM | POA: Diagnosis not present

## 2024-02-11 DIAGNOSIS — S32010D Wedge compression fracture of first lumbar vertebra, subsequent encounter for fracture with routine healing: Secondary | ICD-10-CM | POA: Diagnosis not present

## 2024-02-11 DIAGNOSIS — R195 Other fecal abnormalities: Secondary | ICD-10-CM

## 2024-02-11 DIAGNOSIS — K317 Polyp of stomach and duodenum: Secondary | ICD-10-CM | POA: Diagnosis not present

## 2024-02-11 DIAGNOSIS — Z743 Need for continuous supervision: Secondary | ICD-10-CM | POA: Diagnosis not present

## 2024-02-11 DIAGNOSIS — M069 Rheumatoid arthritis, unspecified: Secondary | ICD-10-CM | POA: Diagnosis not present

## 2024-02-11 DIAGNOSIS — I5033 Acute on chronic diastolic (congestive) heart failure: Secondary | ICD-10-CM | POA: Diagnosis not present

## 2024-02-11 DIAGNOSIS — I5031 Acute diastolic (congestive) heart failure: Secondary | ICD-10-CM | POA: Diagnosis not present

## 2024-02-11 DIAGNOSIS — Z7189 Other specified counseling: Secondary | ICD-10-CM | POA: Diagnosis not present

## 2024-02-11 DIAGNOSIS — K449 Diaphragmatic hernia without obstruction or gangrene: Secondary | ICD-10-CM | POA: Diagnosis not present

## 2024-02-11 DIAGNOSIS — R0602 Shortness of breath: Secondary | ICD-10-CM | POA: Diagnosis not present

## 2024-02-11 DIAGNOSIS — R2689 Other abnormalities of gait and mobility: Secondary | ICD-10-CM | POA: Diagnosis not present

## 2024-02-11 DIAGNOSIS — D696 Thrombocytopenia, unspecified: Secondary | ICD-10-CM | POA: Diagnosis not present

## 2024-02-11 DIAGNOSIS — I4891 Unspecified atrial fibrillation: Secondary | ICD-10-CM

## 2024-02-11 DIAGNOSIS — I509 Heart failure, unspecified: Secondary | ICD-10-CM | POA: Diagnosis not present

## 2024-02-11 DIAGNOSIS — F32A Depression, unspecified: Secondary | ICD-10-CM | POA: Diagnosis present

## 2024-02-11 DIAGNOSIS — N1831 Chronic kidney disease, stage 3a: Secondary | ICD-10-CM | POA: Diagnosis not present

## 2024-02-11 DIAGNOSIS — Z86718 Personal history of other venous thrombosis and embolism: Secondary | ICD-10-CM | POA: Diagnosis not present

## 2024-02-11 DIAGNOSIS — D539 Nutritional anemia, unspecified: Secondary | ICD-10-CM | POA: Diagnosis present

## 2024-02-11 DIAGNOSIS — D649 Anemia, unspecified: Secondary | ICD-10-CM | POA: Diagnosis not present

## 2024-02-11 DIAGNOSIS — C241 Malignant neoplasm of ampulla of Vater: Secondary | ICD-10-CM | POA: Diagnosis not present

## 2024-02-11 DIAGNOSIS — I5032 Chronic diastolic (congestive) heart failure: Secondary | ICD-10-CM | POA: Diagnosis not present

## 2024-02-11 DIAGNOSIS — I480 Paroxysmal atrial fibrillation: Secondary | ICD-10-CM | POA: Diagnosis not present

## 2024-02-11 DIAGNOSIS — J90 Pleural effusion, not elsewhere classified: Secondary | ICD-10-CM | POA: Diagnosis not present

## 2024-02-11 DIAGNOSIS — C170 Malignant neoplasm of duodenum: Secondary | ICD-10-CM | POA: Diagnosis not present

## 2024-02-11 DIAGNOSIS — M109 Gout, unspecified: Secondary | ICD-10-CM | POA: Diagnosis not present

## 2024-02-11 DIAGNOSIS — R918 Other nonspecific abnormal finding of lung field: Secondary | ICD-10-CM | POA: Diagnosis not present

## 2024-02-11 DIAGNOSIS — S32010A Wedge compression fracture of first lumbar vertebra, initial encounter for closed fracture: Secondary | ICD-10-CM | POA: Diagnosis not present

## 2024-02-11 DIAGNOSIS — R6889 Other general symptoms and signs: Secondary | ICD-10-CM | POA: Diagnosis not present

## 2024-02-11 DIAGNOSIS — N3000 Acute cystitis without hematuria: Secondary | ICD-10-CM | POA: Diagnosis not present

## 2024-02-11 DIAGNOSIS — I4719 Other supraventricular tachycardia: Secondary | ICD-10-CM | POA: Diagnosis not present

## 2024-02-11 DIAGNOSIS — K5900 Constipation, unspecified: Secondary | ICD-10-CM | POA: Diagnosis not present

## 2024-02-11 DIAGNOSIS — N183 Chronic kidney disease, stage 3 unspecified: Secondary | ICD-10-CM | POA: Diagnosis not present

## 2024-02-11 DIAGNOSIS — K2289 Other specified disease of esophagus: Secondary | ICD-10-CM | POA: Diagnosis not present

## 2024-02-11 DIAGNOSIS — I10 Essential (primary) hypertension: Secondary | ICD-10-CM | POA: Diagnosis not present

## 2024-02-11 DIAGNOSIS — D500 Iron deficiency anemia secondary to blood loss (chronic): Secondary | ICD-10-CM

## 2024-02-11 DIAGNOSIS — K922 Gastrointestinal hemorrhage, unspecified: Secondary | ICD-10-CM | POA: Diagnosis not present

## 2024-02-11 DIAGNOSIS — Z515 Encounter for palliative care: Secondary | ICD-10-CM | POA: Diagnosis not present

## 2024-02-11 DIAGNOSIS — J80 Acute respiratory distress syndrome: Secondary | ICD-10-CM | POA: Diagnosis not present

## 2024-02-11 DIAGNOSIS — I700 Atherosclerosis of aorta: Secondary | ICD-10-CM | POA: Diagnosis not present

## 2024-02-11 LAB — BASIC METABOLIC PANEL WITH GFR
Anion gap: 6 (ref 5–15)
BUN: 25 mg/dL — ABNORMAL HIGH (ref 8–23)
CO2: 25 mmol/L (ref 22–32)
Calcium: 8 mg/dL — ABNORMAL LOW (ref 8.9–10.3)
Chloride: 105 mmol/L (ref 98–111)
Creatinine, Ser: 1.19 mg/dL — ABNORMAL HIGH (ref 0.44–1.00)
GFR, Estimated: 47 mL/min — ABNORMAL LOW (ref >60)
Glucose, Bld: 93 mg/dL (ref 70–99)
Potassium: 3.6 mmol/L (ref 3.5–5.1)
Sodium: 136 mmol/L (ref 135–145)

## 2024-02-11 LAB — CBC
HCT: 30.1 % — ABNORMAL LOW (ref 36.0–46.0)
Hemoglobin: 8.2 g/dL — ABNORMAL LOW (ref 12.0–15.0)
MCH: 26.8 pg (ref 26.0–34.0)
MCHC: 27.2 g/dL — ABNORMAL LOW (ref 30.0–36.0)
MCV: 98.4 fL (ref 80.0–100.0)
Platelets: 71 K/uL — ABNORMAL LOW (ref 150–400)
RBC: 3.06 MIL/uL — ABNORMAL LOW (ref 3.87–5.11)
RDW: 20.4 % — ABNORMAL HIGH (ref 11.5–15.5)
WBC: 6 K/uL (ref 4.0–10.5)
nRBC: 0.5 % — ABNORMAL HIGH (ref 0.0–0.2)

## 2024-02-11 LAB — URINE CULTURE

## 2024-02-11 NOTE — Care Management Obs Status (Signed)
 MEDICARE OBSERVATION STATUS NOTIFICATION   Patient Details  Name: Peggy Armstrong MRN: [id number] Date of Birth: January 14, 1946   Medicare Observation Status Notification Given:  Yes (Pt refused to sign, but understood notice.)    Lorraine LILLETTE Fenton, LCSW 02/11/2024, 9:35 AM

## 2024-02-11 NOTE — Evaluation (Addendum)
 Physical Therapy Evaluation Patient Details Name: Peggy Armstrong MRN: [id number] DOB: Nov 10, 1945 Today's Date: 02/11/2024  History of Present Illness  78 y.o. female who presented to the emergency department yesterday with profound fatigue and back pain, found to have hemoglobin 5.5 and positive fecal occult blood. CT= Acute L1 compression fracture.  2. Mild L3 compression fracture with progressive height loss since  10/20/2023.  3. 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 GI consulted. extensive medical history to include congestive heart failure, A-fib on Eliquis , AAA, HTN, chronic back pain, history of DVT, CKD and ampullary adenocarcinoma, L humerus fx, RA, L elbow avulsion fx, LLE fx  Clinical Impression  Pt admitted with above diagnosis.  Pt with multiple recent admissions and SNF stays per chart review and discussion with pt grand-dtr. Pt presents with general functional decline. Requiring min assist of 2 for SPT this session, very unsteady. Pt is tearful regarding future medical procedures and possible need for rehab again.  Pt dtr and her grand-dtr have been assisting at home however pt's dtr is undergoing chemo and unable to assist on tx wks, grand-dtr is a Runner, broadcasting/film/video and is going back to school 8/18. Pt grand-dtr states pt will need to be mod I for transfers and toileting to return home.  Patient will benefit from continued inpatient follow up therapy, <3 hours/day   Pt currently with functional limitations due to the deficits listed below (see PT Problem List). Pt will benefit from acute skilled PT to increase their independence and safety with mobility to allow discharge.           If plan is discharge home, recommend the following: Two people to help with walking and/or transfers;Two people to help with bathing/dressing/bathroom;Help with stairs or ramp for entrance;Assist for transportation;Assistance with cooking/housework   Can travel by  private vehicle   No    Equipment Recommendations None recommended by PT  Recommendations for Other Services       Functional Status Assessment Patient has had a recent decline in their functional status and demonstrates the ability to make significant improvements in function in a reasonable and predictable amount of time.     Precautions / Restrictions Precautions Precautions: Fall Restrictions Weight Bearing Restrictions Per Provider Order: No      Mobility  Bed Mobility Overal bed mobility: Needs Assistance Bed Mobility: Rolling, Sidelying to Sit Rolling: Min assist, Used rails Sidelying to sit: Min assist       General bed mobility comments: cues to log roll, use of rail; assist to complete roll, assist to elevate trunk, incr time    Transfers Overall transfer level: Needs assistance Equipment used: Rolling walker (2 wheels) Transfers: Bed to chair/wheelchair/BSC Sit to Stand: Min assist, +2 safety/equipment   Step pivot transfers: Min assist, +2 physical assistance, +2 safety/equipment       General transfer comment: STS x2 with assist to rise and stabilize. cues for hand placement and to power up with LEs. unsteady with min assist of 2 to balance and manage RW    Ambulation/Gait               General Gait Details: very small step fwd adn back with min assist. pt states his legs are too weak. lateral steps along EOB with RW and min assist.  Stairs            Wheelchair Mobility     Tilt Bed    Modified Rankin (Stroke Patients Only)  Balance Overall balance assessment: Needs assistance, History of Falls Sitting-balance support: Feet unsupported, No upper extremity supported Sitting balance-Leahy Scale: Fair     Standing balance support: During functional activity, Reliant on assistive device for balance Standing balance-Leahy Scale: Poor Standing balance comment: unsteady without UE and external support                              Pertinent Vitals/Pain Pain Assessment Pain Assessment: Faces Faces Pain Scale: Hurts a little bit Pain Location: back, generalized Pain Descriptors / Indicators: Discomfort Pain Intervention(s): Limited activity within patient's tolerance, Monitored during session, Premedicated before session    Home Living Family/patient expects to be discharged to:: Private residence Living Arrangements: Children Available Help at Discharge: Family;Available PRN/intermittently Type of Home: Apartment Home Access: Stairs to enter Entrance Stairs-Rails: Right;Left Entrance Stairs-Number of Steps: flight   Home Layout: One level Home Equipment: Grab bars - tub/shower;Rolling Walker (2 wheels);Cane - single point;Tub bench;BSC/3in1;Wheelchair - manual Additional Comments: on wait list for ~ 1 yr for main level apt per pt grand dtr; grand-dtr is a Runner, broadcasting/film/video and is about to go back to school (she has been assisting), pt dtr is on chemo and cannot assist on her tx weeks    Prior Function Prior Level of Function : Needs assist             Mobility Comments: pt reports she was able to transfer after rehab without assist; recently requiring incr assist ADLs Comments: pt requires A for all ADLs, self care tasks and IADLs.     Extremity/Trunk Assessment   Upper Extremity Assessment Upper Extremity Assessment: Defer to OT evaluation    Lower Extremity Assessment Lower Extremity Assessment: Generalized weakness    Cervical / Trunk Assessment Cervical / Trunk Assessment: Kyphotic  Communication   Communication Communication: No apparent difficulties    Cognition Arousal: Alert Behavior During Therapy: WFL for tasks assessed/performed   PT - Cognitive impairments: No apparent impairments                       PT - Cognition Comments: occasional redirection to task Following commands: Intact       Cueing Cueing Techniques: Verbal cues     General Comments  General comments (skin integrity, edema, etc.): grand - dtr present for session    Exercises     Assessment/Plan    PT Assessment Patient needs continued PT services  PT Problem List Decreased strength;Decreased activity tolerance;Decreased mobility;Decreased knowledge of use of DME;Decreased balance       PT Treatment Interventions DME instruction;Therapeutic exercise;Gait training;Functional mobility training;Therapeutic activities;Patient/family education;Balance training    PT Goals (Current goals can be found in the Care Plan section)  Acute Rehab PT Goals Patient Stated Goal: home, family feels pt will need rehab PT Goal Formulation: With patient Time For Goal Achievement: 02/25/24 Potential to Achieve Goals: Fair    Frequency Min 3X/week     Co-evaluation               AM-PAC PT 6 Clicks Mobility  Outcome Measure Help needed turning from your back to your side while in a flat bed without using bedrails?: A Little Help needed moving from lying on your back to sitting on the side of a flat bed without using bedrails?: A Little Help needed moving to and from a bed to a chair (including a wheelchair)?: A Lot Help needed standing up  from a chair using your arms (e.g., wheelchair or bedside chair)?: A Little Help needed to walk in hospital room?: Total Help needed climbing 3-5 steps with a railing? : Total 6 Click Score: 13    End of Session Equipment Utilized During Treatment: Gait belt Activity Tolerance: Patient tolerated treatment well;Patient limited by fatigue Patient left: in chair;with family/visitor present Nurse Communication: Mobility status PT Visit Diagnosis: Other abnormalities of gait and mobility (R26.89)    Time: 1344-1410 PT Time Calculation (min) (ACUTE ONLY): 26 min   Charges:   PT Evaluation $PT Eval Low Complexity: 1 Low PT Treatments $Therapeutic Activity: 8-22 mins PT General Charges $$ ACUTE PT VISIT: 1 Visit         Kanishk Stroebel,  PT  Acute Rehab Dept ([facility]) [phone]  02/11/2024   [facility] 02/11/2024, 3:08 PM

## 2024-02-11 NOTE — Progress Notes (Signed)
  Progress Note   Patient: Peggy Armstrong FMW:[id number] DOB: Apr 26, 1946 DOA: 02/10/2024     0 DOS: the patient was seen and examined on 02/11/2024   Brief hospital course: 78 year old woman complex PMH including inoperable duodenal adenocarcinoma, rheumatoid arthritis, CKD, multiple spontaneous vertebral compression fractures, who presented to the Emergency Department for new back pain.  Evaluation was notable for profound anemia and CT showed acute L1 compression fracture.  She was admitted for transfusion, pain control.  GI was consulted.   Consultants [facility]  Procedures/Events 8/16 admit for severe anemia, new L1 compression fx with acute pain  Assessment and Plan: Acute on chronic anemia, macrocytic, asymptomatic Severe anemia hemoglobin 5.5.  Transfused 2 units 8/16 with appropriate rise. GI consultation appreciated.  Holding Eliquis .  No endoscopy planned at this time. Trend hemoglobin.   Acute L1 compression fracture Pain control, early ambulation. Consult TOC, physical therapy is recommending SNF.   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, stable   Acquired hypothyroidism Continue levothyroxine    Atypical atrial flutter Continue amiodarone , diltiazem  Holding apixaban    Chronic diastolic CHF Appears stable    CKD stage IIIa Creatinine appears to be at baseline around 1.05       Subjective:  Feels ok Has some back pain Wants to work with PT  Physical Exam: Vitals:   02/10/24 2037 02/10/24 2055 02/11/24 0124 02/11/24 0510  BP: (!) 156/78 127/75 118/60 (!) 152/88  Pulse: 75 75 74 72  Resp: 17 18 15 17   Temp: 98.5 F (36.9 C) 98.7 F (37.1 C) 98.6 F (37 C) 98.1 F (36.7 C)  TempSrc: Oral Oral Oral Oral  SpO2: 100% 98% 97% 98%  Weight:      Height:       Physical Exam Vitals reviewed.  Constitutional:      General: She is not in acute  distress.    Appearance: She is not ill-appearing or toxic-appearing.  Cardiovascular:     Rate and Rhythm: Normal rate and regular rhythm.     Heart sounds: No murmur heard. Pulmonary:     Effort: Pulmonary effort is normal. No respiratory distress.     Breath sounds: No wheezing, rhonchi or rales.  Neurological:     Mental Status: She is alert.  Psychiatric:        Mood and Affect: Mood normal.        Behavior: Behavior normal.     Data Reviewed: CBG stable Creatinine stable WBC up to 8.2 status posttransfusion Platelets stable 71  Family Communication: none   Disposition: Status is: Observation      Time spent: 20 minutes  Author: Toribio Door, MD 02/11/2024 8:01 AM  For on call review [email or web address].

## 2024-02-11 NOTE — Consult Note (Signed)
 Consultation  Referring Provider:     Dr. Jadine Primary Care Physician:  Iven [name] DASEN, PA-C Primary Gastroenterologist:      Dr. Teressa   Reason for Consultation:     Severe anemia         HPI:   Peggy Armstrong is a 78 y.o. female with extensive medical history to include congestive heart failure, A-fib on Eliquis , rheumatoid arthritis, chronic back pain, history of DVT, CKD and ampullary adenocarcinoma who presented to the emergency department yesterday with profound fatigue and back pain, found to have hemoglobin 5.5 and positive fecal occult blood test.  She was transfused 2 units with appropriate response, hemoglobin this morning 8.2.  BUN 29 on admission, 25 this morning.  Her hemoglobin was 7.3 on May 23.  Her baseline seems to be around 7-8.  She reports that she got a unit of blood transfused as an outpatient about a month ago through her oncologist. The patient states that she came to the emergency department because of severe back pain.  She had been experiencing worsening pain over the past 3 to 4 days, and finally decided seek medical attention for improved pain control.  She does not know the names of the medications that she takes, and states that her daughter manages her medications.  But she denies taking any NSAIDs, as she knows not to take this because she is on blood thinners.  However, she is prescribed diclofenac twice daily which she has presumably been getting.  She does state that she takes Tylenol  and has a recent prescription for tramadol .  In addition to the severe anemia, a CT scan also showed a new, acute L1 compression fracture.  She states that her stools are always black.  She takes iron  tablets daily.  She had not noticed any change in her stool appearance in the past week or so.  She reports having 1-2 formed dark stools daily.  She denies symptoms of abdominal pain, nausea or vomiting.  She does admit to decreased appetite in the past few days, which  she says is because of the severe back pain.  She had history of ampullary polyp involving the major papilla diagnosed in 2018 and underwent ampullectomy in 2019 per Dr. Krystal Matter at [facility].  Path at that time showed an an adenomatous polyp with intestinal differentiation can containing extensive high-grade dysplasia and intramucosal adenocarcinoma and focal areas of highly suspicious for submucosal invasion, margin indeterminant. She had ERCP/EUS February 2019 Dr. Matter with finding of a visibly patent stent from the biliary.  She underwent tissue destruction in the area of the papilla with APC.  EUS showed a normal-appearing stomach and no significant pathology in the pancreatic head Follow-up EGD May 2019 Dr. Euel normal stomach, evidence of prior ampullectomy and a single duodenal polyp was removed   She was seen in November 2024 here when she was hospitalized after a lumbar fracture and had hemoglobin in the 7 range with melenic stool.  She had EGD on 05/16/2023 with finding of LA grade B esophagitis in the distal esophagus medium size hiatal hernia, multiple gastric polyps 2 of which were oozing and were resected and a likely malignant duodenal mass, medium sized fungating and multilobulated with no obvious bleeding. 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 She was referred back to Krystal Euel for resection.   On July 11, 2023 she underwent EGD with endoscopic mucosal resection of a very  large multilobulated duodenal polyp in the second portion of the duodenum this measured greater than 50 mm by Dr. Minnie.  She had snare mucosal resection, then Madelyn net and snare retrieval 40 mm area was resected the resection was incomplete and felt to have about 50 to 60% of the lesion resected.  She expected a post-EMR bleed and was admitted in Jan 2025.  Her bleeding was severe, requiring 7 units pRBCs, Kcentra  x 2,  and 1 uFFP. EGD  January 24 showed residual tumor in the second portion of the duodenum, with multiple ulcers, with visible vessels and adherent clot, treated with Hemoclip, clot removal and hemostatic gel.  She continued to bleed, however and she underwent GDA embolization x 3 on Jan 25th.   She underwent repeat EGD with repeat attempt at endoscopic resection on October 19, 2023 with Dr. Milburn at [facility], but this was again incomplete.  She was referred for surgical resection.  Since then, she had several falls with hospitalizations, SNF admissions and decline in performance status.  She was seen by Dr. Dasie January 25 to discuss Whipple, and was not felt to be a very good candidate based on her functional status.  She has also seen Dr. Ezzard with oncology and is being considered for chemotherapy.  She has a follow-up with him at the end of this month.  Although the patient appears to be on the schedule for a Whipple surgery next month, the patient states that she is not certain she is going to proceed with this, as she is quite concerned about recovery.      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   Cellulitis of left arm 08/14/2023   Cholangitis    Choledocholithiasis 03/22/2017   Chronic diastolic CHF (congestive heart failure) (HCC) 11/08/2019   Chronic low back pain 07/18/2023   CKD (chronic kidney disease) stage 3, GFR 30-59 ml/min (HCC) 06/09/2017   CKD (chronic kidney disease), stage III (HCC) 06/09/2017   Closed compression fracture of L2 lumbar vertebra, initial encounter (HCC) 05/11/2023   Clotting disorder (HCC)     right DVT     Dehydration 05/12/2023   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   Duodenal mass    Duodenal ulcer    DVT (deep venous thrombosis) (HCC)    right DVT   Epigastric pain    Essential hypertension    Fall at home, initial encounter 10/21/2023   GAD (generalized anxiety disorder)    Gastric polyps 05/16/2023   Generalized weakness 05/12/2023   GI bleed 07/20/2023   Gout    Gout    Hematemesis 11/05/2017   Hemorrhagic shock (HCC) 07/21/2023   History of depression 10/21/2023   History of DVT (deep vein thrombosis) 11/08/2019   History of GI bleed 10/21/2023   Hyperlipidemia 05/12/2023   Hypertension    Hypokalemia 07/03/2017   Hypothyroidism 05/12/2023   Iron  deficiency anemia 08/02/2016   Left elbow avulsion fracture 10/21/2023   Left humeral fracture 10/21/2023   Long term current use of anticoagulant therapy 06/20/2023   Macrocytic anemia 11/05/2017   Melena  11/05/2017   Morbid obesity (HCC) 11/08/2019   Nausea & vomiting    Need for immunization against influenza 07/18/2019   Need for vaccination 04/23/2018   Occult blood in stools 05/16/2023   Paroxysmal atrial fibrillation (HCC) 05/12/2023   Peri-ampullary neoplasm    Poor dentition 08/14/2016   RA (rheumatoid arthritis) (HCC)    Restless leg 12/02/2022   Right shoulder pain 07/26/2018   Routine general medical examination at a health care facility 12/13/2019   S/P ERCP 05/05/2017   SIRS (systemic inflammatory response syndrome) (HCC) 03/22/2017   Upper GI bleed 06/20/2023    Past Surgical History:  Procedure Laterality Date   ABDOMINAL HYSTERECTOMY     BIOPSY  05/16/2023   Procedure: BIOPSY;  Surgeon: Aneita Gwendlyn DASEN, MD;  Location: [facility];  Service: Gastroenterology;;   CHOLECYSTECTOMY     ENDOSCOPIC RETROGRADE CHOLANGIOPANCREATOGRAPHY (ERCP) WITH PROPOFOL  N/A 06/15/2017   Procedure: ENDOSCOPIC RETROGRADE CHOLANGIOPANCREATOGRAPHY (ERCP) WITH PROPOFOL ;  Surgeon: Teressa Toribio SQUIBB, MD;   Location: [facility];  Service: Endoscopy;  Laterality: N/A;   ESOPHAGOGASTRODUODENOSCOPY (EGD) WITH PROPOFOL  N/A 03/24/2017   Procedure: ESOPHAGOGASTRODUODENOSCOPY (EGD) WITH PROPOFOL ;  Surgeon: Legrand Victory LITTIE DOUGLAS, MD;  Location: [facility];  Service: Gastroenterology;  Laterality: N/A;   ESOPHAGOGASTRODUODENOSCOPY (EGD) WITH PROPOFOL  N/A 11/07/2017   Procedure: ESOPHAGOGASTRODUODENOSCOPY (EGD) WITH PROPOFOL ;  Surgeon: Teressa Toribio SQUIBB, MD;  Location: [facility];  Service: Endoscopy;  Laterality: N/A;   ESOPHAGOGASTRODUODENOSCOPY (EGD) WITH PROPOFOL  N/A 05/16/2023   Procedure: ESOPHAGOGASTRODUODENOSCOPY (EGD) WITH PROPOFOL ;  Surgeon: Aneita Gwendlyn DASEN, MD;  Location: [facility];  Service: Gastroenterology;  Laterality: N/A;   ESOPHAGOGASTRODUODENOSCOPY (EGD) WITH PROPOFOL  N/A 07/21/2023   Procedure: ESOPHAGOGASTRODUODENOSCOPY (EGD) WITH PROPOFOL ;  Surgeon: Charlanne Groom, MD;  Location: [facility]COPY;  Service: Gastroenterology;  Laterality: N/A;   EUS N/A 04/06/2017   Procedure: UPPER ENDOSCOPIC ULTRASOUND (EUS) LINEAR;  Surgeon: Teressa Toribio SQUIBB, MD;  Location: [facility];  Service: Endoscopy;  Laterality: N/A;  to evaluate duodenal mass   HEMOSTASIS CLIP PLACEMENT  07/21/2023   Procedure: HEMOSTASIS CLIP PLACEMENT;  Surgeon: Charlanne Groom, MD;  Location: [facility] ENDOSCOPY;  Service: Gastroenterology;;   HEMOSTASIS CONTROL  07/21/2023   Procedure: HEMOSTASIS CONTROL;  Surgeon: Charlanne Groom, MD;  Location: [facility] ENDOSCOPY;  Service: Gastroenterology;;   HERNIA REPAIR     IR ANGIOGRAM VISCERAL SELECTIVE  07/22/2023   IR BILIARY DRAIN PLACEMENT WITH CHOLANGIOGRAM  03/25/2017   IR CONVERT BILIARY DRAIN TO INT EXT BILIARY DRAIN  04/03/2017   IR EMBO ART  VEN HEMORR LYMPH EXTRAV  INC GUIDE ROADMAPPING  07/22/2023   IR US  GUIDE VASC ACCESS RIGHT  07/22/2023   POLYPECTOMY  05/16/2023   Procedure: POLYPECTOMY;  Surgeon: Aneita Gwendlyn DASEN, MD;  Location: [facility];  Service: Gastroenterology;;    Family  History  Problem Relation Age of Onset   Hypertension Mother    Prostate cancer Father    Colon cancer Father    Hypertension Brother    Rheum arthritis Maternal Grandmother    Diabetes Paternal Grandmother    Heart disease Paternal Grandfather    Colon cancer Child      Social History   Tobacco Use   Smoking status: Former    Types: Cigarettes    Passive exposure: Past   Smokeless tobacco: Former   Tobacco comments:    Quit in 2012. Started at 20's. Cannot recall how many.   Vaping Use   Vaping status: Never Used  Substance Use Topics   Alcohol use: No  Drug use: No    Prior to Admission medications   Medication Sig Start Date End Date Taking? Authorizing Provider  acetaminophen  (TYLENOL ) 325 MG tablet Take 650 mg by mouth every 6 (six) hours as needed for mild pain (pain score 1-3) or moderate pain (pain score 4-6).   Yes [provider]  albuterol  (VENTOLIN  HFA) 108 (90 Base) MCG/ACT inhaler Inhale 1-2 puffs into the lungs every 6 (six) hours as needed for wheezing or shortness of breath.   Yes [provider]  allopurinol  (ZYLOPRIM ) 300 MG tablet Take 1 tablet (300 mg total) by mouth daily. 07/18/23  Yes Rothfuss, Jacob T, PA-C  amiodarone  (PACERONE ) 200 MG tablet Take 1 tablet (200 mg total) by mouth daily. 09/01/23  Yes Croitoru, Mihai, MD  Ascorbic Acid (VITAMIN C PO) Take 1 tablet by mouth in the morning.   Yes [provider]  atorvastatin  (LIPITOR) 20 MG tablet Take 1 tablet (20 mg total) by mouth daily. 12/19/22  Yes Vannie Reche RAMAN, NP  busPIRone  (BUSPAR ) 10 MG tablet TAKE 1 TABLET BY MOUTH TWICE A DAY 08/22/23  Yes Rothfuss, Jacob T, PA-C  Cholecalciferol  (VITAMIN D3 PO) Take 1 capsule by mouth in the morning.   Yes [provider]  cyanocobalamin  (VITAMIN B12) 1000 MCG tablet Take 1,000 mcg by mouth daily. Patient taking differently: Take 500-1,000 mcg by mouth daily.   Yes [provider]  diclofenac (VOLTAREN) 50 MG EC  tablet Take 50 mg by mouth 2 (two) times daily with a meal.   Yes [provider]  diltiazem  (CARDIZEM  CD) 120 MG 24 hr capsule Take 120 mg by mouth daily.   Yes [provider]  DULoxetine  (CYMBALTA ) 20 MG capsule TAKE 1 CAPSULE BY MOUTH EVERY DAY 10/23/23  Yes Rothfuss, Jacob T, PA-C  ELIQUIS  2.5 MG TABS tablet Take 2.5 mg by mouth in the morning and at bedtime.   Yes [provider]  ferrous sulfate  325 (65 FE) MG tablet Take 325 mg by mouth daily with breakfast.   Yes [provider]  ipratropium-albuterol  (DUONEB) 0.5-2.5 (3) MG/3ML SOLN Take 3 mLs by nebulization every 6 (six) hours as needed (for shortness of breath or wheezing).   Yes [provider]  KLOR-CON  M20 20 MEQ tablet Take 20 mEq by mouth in the morning and at bedtime.   Yes [provider]  levothyroxine  (SYNTHROID ) 100 MCG tablet Take 1 tablet (100 mcg total) by mouth daily before breakfast. 07/18/23  Yes Rothfuss, Jacob T, PA-C  melatonin 3 MG TABS tablet Take 1 tablet (3 mg total) by mouth at bedtime as needed (insomnia). Patient taking differently: Take 3 mg by mouth at bedtime. 05/18/23  Yes Krishnan, Gokul, MD  methocarbamol  (ROBAXIN ) 500 MG tablet TAKE 1 TABLET BY MOUTH EVERY 6 HOURS AS NEEDED FOR MUSCLE SPASMS. 10/03/23  Yes Rothfuss, Jacob T, PA-C  ondansetron  (ZOFRAN ) 4 MG tablet Take 4 mg by mouth every 6 (six) hours as needed for nausea or vomiting.   Yes [provider]  pantoprazole  (PROTONIX ) 40 MG tablet Take 1 tablet (40 mg total) by mouth daily. Patient taking differently: Take 40 mg by mouth daily before breakfast. 07/18/23  Yes Rothfuss, Jacob T, PA-C  sertraline  (ZOLOFT ) 25 MG tablet Take 25 mg by mouth daily.   Yes [provider]  torsemide  (DEMADEX ) 20 MG tablet Take Torsemide  on alternating days40 mg one day (2 tablets) then the next day 20 mg (1 tablet). Patient taking differently: Take 20 mg  by mouth in the morning. 09/21/23  Yes Madireddy,  Alean SAUNDERS, MD  traMADol  (ULTRAM ) 50 MG tablet Take 1 tablet (50 mg total) by mouth every 8 (eight) hours as needed. Patient taking differently: Take 50 mg by mouth every 8 (eight) hours as needed (for pain). 02/01/24  Yes Rothfuss, Jacob T, PA-C  Calcium  Carb-Cholecalciferol  (CALCIUM  600 + D PO) Take 1 tablet by mouth in the morning.    [provider]  ELIQUIS  5 MG TABS tablet Take 1 tablet (5 mg total) by mouth 2 (two) times daily. Patient not taking: Reported on 02/10/2024 10/29/23   Vernon Ranks, MD  folic acid  (FOLVITE ) 1 MG tablet Take 1 tablet (1 mg total) by mouth daily. Patient not taking: Reported on 02/10/2024 05/18/23   Krishnan, Gokul, MD    Current Facility-Administered Medications  Medication Dose Route Frequency Provider Last Rate Last Admin   0.9 %  sodium chloride  infusion  250 mL Intravenous PRN Jadine Toribio SQUIBB, MD       acetaminophen  (TYLENOL ) tablet 650 mg  650 mg Oral Q6H PRN Jadine Toribio SQUIBB, MD       Or   acetaminophen  (TYLENOL ) suppository 650 mg  650 mg Rectal Q6H PRN Jadine Toribio SQUIBB, MD       allopurinol  (ZYLOPRIM ) tablet 300 mg  300 mg Oral Daily Jadine Toribio SQUIBB, MD   300 mg at 02/10/24 1821   amiodarone  (PACERONE ) tablet 200 mg  200 mg Oral Daily Jadine Toribio SQUIBB, MD   200 mg at 02/10/24 8177   apixaban  (ELIQUIS ) tablet 2.5 mg  2.5 mg Oral BID Jadine Toribio SQUIBB, MD   2.5 mg at 02/10/24 2119   busPIRone  (BUSPAR ) tablet 10 mg  10 mg Oral BID Jadine Toribio SQUIBB, MD   10 mg at 02/10/24 2119   cyanocobalamin  (VITAMIN B12) tablet 1,000 mcg  1,000 mcg Oral Daily Jadine Toribio SQUIBB, MD       diltiazem  (CARDIZEM  CD) 24 hr capsule 120 mg  120 mg Oral Daily Goodrich, Daniel P, MD   120 mg at 02/10/24 1821   DULoxetine  (CYMBALTA ) DR capsule 20 mg  20 mg Oral Daily Goodrich, Daniel P, MD   20 mg at 02/10/24 1821   HYDROmorphone  (DILAUDID ) injection 0.5-1 mg  0.5-1 mg Intravenous Q2H PRN Jadine Toribio SQUIBB, MD   1 mg at 02/11/24 9383   levothyroxine   (SYNTHROID ) tablet 100 mcg  100 mcg Oral QAC breakfast Jadine Toribio SQUIBB, MD   100 mcg at 02/11/24 9383   methocarbamol  (ROBAXIN ) tablet 500 mg  500 mg Oral Q6H PRN Jadine Toribio SQUIBB, MD       ondansetron  (ZOFRAN ) tablet 4 mg  4 mg Oral Q6H PRN Jadine Toribio SQUIBB, MD       Or   ondansetron  (ZOFRAN ) injection 4 mg  4 mg Intravenous Q6H PRN Jadine Toribio SQUIBB, MD       oxyCODONE  (Oxy IR/ROXICODONE ) immediate release tablet 5 mg  5 mg Oral Q4H PRN Jadine Toribio SQUIBB, MD       potassium chloride  SA (KLOR-CON  M) CR tablet 20 mEq  20 mEq Oral QHS Jadine Toribio SQUIBB, MD   20 mEq at 02/10/24 2118   sodium chloride  flush (NS) 0.9 % injection 3 mL  3 mL Intravenous Q12H Jadine Toribio SQUIBB, MD   3 mL at 02/10/24 2125   sodium chloride  flush (NS) 0.9 % injection 3 mL  3 mL Intravenous Q12H Jadine Toribio SQUIBB, MD   3 mL at 02/10/24  2124   sodium chloride  flush (NS) 0.9 % injection 3 mL  3 mL Intravenous PRN Jadine Toribio SQUIBB, MD   3 mL at 02/10/24 2124    Allergies as of 02/10/2024 - Review Complete 02/10/2024  Allergen Reaction Noted   Codeine Nausea And Vomiting and Other (See Comments) 04/18/2011   Tape Other (See Comments) 06/20/2023     Review of Systems:    As per HPI, otherwise negative    Physical Exam:  Vital signs in last 24 hours: Temp:  [97.4 F (36.3 C)-98.8 F (37.1 C)] 97.9 F (36.6 C) (08/17 0839) Pulse Rate:  [71-78] 72 (08/17 0839) Resp:  [14-18] 14 (08/17 0839) BP: (107-156)/(56-97) 139/81 (08/17 0839) SpO2:  [94 %-100 %] 95 % (08/17 0839) Weight:  [89.3 kg] 89.3 kg (08/16 1506)   General:   Pleasant chronically ill-appearing obese Caucasian female in NAD Head:  Normocephalic and atraumatic.  Edentulous Eyes:   No icterus.   Conjunctiva pink. Ears:  Normal auditory acuity. Neck:  Supple Lungs: Respiratory effort slightly labored. Lungs clear to auscultation bilaterally.   No wheezes, crackles, or rhonchi.  Heart:  Regular rate and rhythm; no MRG Abdomen:  Soft,  nondistended, nontender. Normal bowel sounds. No appreciable masses or hepatomegaly.  Rectal:  Not performed.  Msk:  Symmetrical without gross deformities.  Extremities: Trace bilateral lower extremity edema. Neurologic:  Alert and  oriented x4;  grossly normal neurologically. Skin:  Intact without significant lesions or rashes. Psych:  Alert and cooperative. Normal affect.  LAB RESULTS: Recent Labs    02/10/24 0851 02/11/24 0433  WBC 5.0 6.0  HGB 5.5* 8.2*  HCT 21.5* 30.1*  PLT 75* 71*   BMET Recent Labs    02/10/24 0851 02/11/24 0433  NA 139 136  K 4.0 3.6  CL 105 105  CO2 23 25  GLUCOSE 97 93  BUN 29* 25*  CREATININE 1.05* 1.19*  CALCIUM  8.1* 8.0*   LFT Recent Labs    02/10/24 0851  PROT 5.7*  ALBUMIN 2.4*  AST 34  ALT 22  ALKPHOS 187*  BILITOT 0.7   PT/INR No results for input(s): LABPROT, INR in the last 72 hours.  STUDIES: CT Lumbar Spine Wo Contrast Result Date: 02/10/2024 CLINICAL DATA:  Low back pain.  History of duodenal adenocarcinoma. EXAM: CT LUMBAR SPINE WITHOUT CONTRAST TECHNIQUE: Multidetector CT imaging of the lumbar spine was performed without intravenous contrast administration. Multiplanar CT image reconstructions were also generated. RADIATION DOSE REDUCTION: This exam was performed according to the departmental dose-optimization program which includes automated exposure control, adjustment of the mA and/or kV according to patient size and/or use of iterative reconstruction technique. COMPARISON:  Lumbar spine CT 05/11/2023 and MRI 05/13/2023. Outside CT abdomen and pelvis 10/20/2023 from [facility]. FINDINGS: Segmentation: 5 lumbar type vertebrae. Alignment: Mild lumbar dextroscoliosis. Chronic grade 1 anterolisthesis of L4 on L5. Vertebrae: Chronic L2 burst fracture with severe central vertebral body height loss and 6 mm retropulsion, similar to the 10/20/2023 CT. Mild L3 compression fracture with 25% vertebral body height loss, new from  05/11/2023 and with mildly progressive height loss since 10/20/2023. New, likely acute L1 compression fracture with 25% height loss and no retropulsion. Hemangiomas in the T12 and L3 vertebral bodies. Old sacral insufficiency fractures. Old posterior right twelfth rib fracture. Paraspinal and other soft tissues: Aortic atherosclerosis. Subcutaneous edema throughout the back. Partially visualized small bowel lipoma. Partially visualized embolization coils in the region of the duodenum and pancreatic head. Disc levels: L2 retropulsion  is again noted to result in moderate spinal stenosis. Mild spinal stenosis at L4-5. IMPRESSION: 1. Acute L1 compression fracture. 2. Mild L3 compression fracture with progressive height loss since 10/20/2023. 3. 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 4.  Aortic Atherosclerosis (ICD10-I70.0). Electronically Signed   By: Dasie Hamburg M.D.   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  Stool Hemoccult positive, but no overt GI bleeding, patient with chronic dark stool secondary to oral iron  therapy.  Hemodynamically stable. Low concern for acute, high risk bleed based on clinical presentation.  Patient has known duodenal malignancy, which is likely the source of her chronic bleeding. Unlikely that EGD would be able to provide any definitive hemostasis, and given her acute lumbar fracture as well as other sedation risk factors, I would advise against pursuing an endoscopy at this time. I would recommend holding her  Eliquis  for couple days to help achieve hemostasis spontaneously, treating with PPI and avoiding all NSAIDs if possible.  Acute on chronic anemia, likely secondary to chronic bleeding from ampullary mass - Recommend against endoscopy currently; if patient does show evidence of overt bleeding, EGD can be performed, but hemostasis measures will likely be limited to temporary measure such as Hemospray/hemostatic gel - Recommend holding Eliquis  for few days to help achieve spontaneous hemostasis - Start pantoprazole  40 mg IV twice daily - Reconsider benefits of chronic diclofenac (if no significant pain benefit, would stop) - Patient would likely benefit from IV iron  either while hospitalized or as outpatient  Ampullary adenocarcinoma, failed endoscopic resection - Has follow-up with Dr. Ezzard next month to consider chemotherapy versus proceeding with Whipple  Acute L1 compression fracture - Pain control per hospitalist  Atrial fibrillation - Recommend holding Eliquis  as above - Continue amiodarone , diltiazem   Dr. Wilhelmenia will be taking over inpatient GI service tomorrow  Thanks   LOS: 0 days   Glendia FORBES Holt  02/11/2024, 9:24 AM

## 2024-02-11 NOTE — Hospital Course (Signed)
 78 year old woman complex PMH including inoperable duodenal adenocarcinoma, rheumatoid arthritis, CKD, multiple spontaneous vertebral compression fractures, who presented to the Emergency Department for new back pain.  Evaluation was notable for profound anemia and CT showed acute L1 compression fracture.  She was admitted for transfusion, pain control.  GI was consulted.   Consultants [facility]  Procedures/Events 8/16 admit for severe anemia, new L1 compression fx with acute pain

## 2024-02-11 NOTE — Plan of Care (Signed)
 Pt affect calm and pleasant. She has an extensive list of comorbidity. She endorse back pain that is relieved with prn medication. Purewick to void. She has not eaten or had a BM in the last 3 days. She has generalized bruising. Will continue to monitor for improvement.

## 2024-02-11 NOTE — Progress Notes (Signed)
   02/11/24 0936  TOC Brief Assessment  Insurance and Status Reviewed ([employer]/medicaid)  Patient has primary care physician Yes  Home environment has been reviewed From home  Prior level of function: Needs Assistance/daughter assists.  Prior/Current Home Services No current home services  Social Drivers of Health Review SDOH reviewed no interventions necessary  Readmission risk has been reviewed Yes  Transition of care needs transition of care needs identified, TOC will continue to follow (Pt states her daughter may not be able to transport home as she too is being treated for a medical condition.  Pt requesting transport home/requested ambulance.)

## 2024-02-12 DIAGNOSIS — S32010A Wedge compression fracture of first lumbar vertebra, initial encounter for closed fracture: Secondary | ICD-10-CM | POA: Diagnosis not present

## 2024-02-12 DIAGNOSIS — I484 Atypical atrial flutter: Secondary | ICD-10-CM | POA: Diagnosis not present

## 2024-02-12 DIAGNOSIS — D649 Anemia, unspecified: Secondary | ICD-10-CM

## 2024-02-12 DIAGNOSIS — C170 Malignant neoplasm of duodenum: Secondary | ICD-10-CM | POA: Diagnosis not present

## 2024-02-12 DIAGNOSIS — Z7901 Long term (current) use of anticoagulants: Secondary | ICD-10-CM | POA: Diagnosis not present

## 2024-02-12 DIAGNOSIS — D500 Iron deficiency anemia secondary to blood loss (chronic): Secondary | ICD-10-CM | POA: Diagnosis not present

## 2024-02-12 HISTORY — DX: Anemia, unspecified: D64.9

## 2024-02-12 LAB — TYPE AND SCREEN
ABO/RH(D): O POS
Antibody Screen: NEGATIVE
Unit division: 0
Unit division: 0

## 2024-02-12 LAB — CBC
HCT: 30.4 % — ABNORMAL LOW (ref 36.0–46.0)
Hemoglobin: 8.4 g/dL — ABNORMAL LOW (ref 12.0–15.0)
MCH: 27.5 pg (ref 26.0–34.0)
MCHC: 27.6 g/dL — ABNORMAL LOW (ref 30.0–36.0)
MCV: 99.3 fL (ref 80.0–100.0)
Platelets: 72 K/uL — ABNORMAL LOW (ref 150–400)
RBC: 3.06 MIL/uL — ABNORMAL LOW (ref 3.87–5.11)
RDW: 19 % — ABNORMAL HIGH (ref 11.5–15.5)
WBC: 6.2 K/uL (ref 4.0–10.5)
nRBC: 0 % (ref 0.0–0.2)

## 2024-02-12 LAB — BPAM RBC
Blood Product Expiration Date: 202509132359
Blood Product Expiration Date: 202509132359
ISSUE DATE / TIME: 202508161250
ISSUE DATE / TIME: 202508161802
Unit Type and Rh: 5100
Unit Type and Rh: 5100

## 2024-02-12 LAB — BASIC METABOLIC PANEL WITH GFR
Anion gap: 10 (ref 5–15)
BUN: 20 mg/dL (ref 8–23)
CO2: 23 mmol/L (ref 22–32)
Calcium: 8.2 mg/dL — ABNORMAL LOW (ref 8.9–10.3)
Chloride: 105 mmol/L (ref 98–111)
Creatinine, Ser: 0.85 mg/dL (ref 0.44–1.00)
GFR, Estimated: >60 mL/min (ref >60)
Glucose, Bld: 95 mg/dL (ref 70–99)
Potassium: 3.4 mmol/L — ABNORMAL LOW (ref 3.5–5.1)
Sodium: 138 mmol/L (ref 135–145)

## 2024-02-12 MED ORDER — OXYCODONE HCL 5 MG PO TABS
5.0000 mg | ORAL_TABLET | ORAL | Status: DC | PRN
  Administered 2024-02-12: 5 mg via ORAL
  Administered 2024-02-13 (×3): 10 mg via ORAL
  Filled 2024-02-12 (×4): qty 2
  Filled 2024-02-12: qty 1
  Filled 2024-02-12: qty 2

## 2024-02-12 MED ORDER — DILTIAZEM HCL 60 MG PO TABS
60.0000 mg | ORAL_TABLET | Freq: Once | ORAL | Status: AC
  Administered 2024-02-12: 60 mg via ORAL
  Filled 2024-02-12: qty 1

## 2024-02-12 MED ORDER — ACETAMINOPHEN 650 MG RE SUPP: 650.0000 mg | Freq: Four times a day (QID) | RECTAL | Status: DC

## 2024-02-12 MED ORDER — DIPHENHYDRAMINE HCL 25 MG PO CAPS
25.0000 mg | ORAL_CAPSULE | Freq: Four times a day (QID) | ORAL | Status: DC | PRN
  Administered 2024-02-12 – 2024-02-25 (×10): 25 mg via ORAL
  Filled 2024-02-12 (×10): qty 1

## 2024-02-12 MED ORDER — HYDROMORPHONE HCL 1 MG/ML IJ SOLN
0.5000 mg | INTRAMUSCULAR | Status: DC | PRN
  Administered 2024-02-12: 1 mg via INTRAVENOUS
  Administered 2024-02-13: 0.5 mg via INTRAVENOUS
  Administered 2024-02-14 – 2024-02-21 (×4): 1 mg via INTRAVENOUS
  Filled 2024-02-12 (×7): qty 1

## 2024-02-12 MED ORDER — POTASSIUM CHLORIDE CRYS ER 20 MEQ PO TBCR
40.0000 meq | EXTENDED_RELEASE_TABLET | Freq: Once | ORAL | Status: AC
  Administered 2024-02-12: 40 meq via ORAL
  Filled 2024-02-12: qty 2

## 2024-02-12 MED ORDER — ACETAMINOPHEN 325 MG PO TABS
650.0000 mg | ORAL_TABLET | Freq: Four times a day (QID) | ORAL | Status: DC
  Administered 2024-02-12 – 2024-02-21 (×33): 650 mg via ORAL
  Filled 2024-02-12 (×33): qty 2

## 2024-02-12 MED ORDER — LEVALBUTEROL HCL 0.63 MG/3ML IN NEBU
0.6300 mg | INHALATION_SOLUTION | Freq: Four times a day (QID) | RESPIRATORY_TRACT | Status: AC | PRN
  Administered 2024-02-13 (×2): 0.63 mg via RESPIRATORY_TRACT
  Filled 2024-02-12 (×2): qty 3

## 2024-02-12 NOTE — Progress Notes (Signed)
 [facility] Progress Note  CC:   Severe anemia   Subjective:  No sign of bleeding/no BM in 24 hours.  Says that she is not eating much.  Says that it is hard to know because her stools are always dark from her iron  supplements.  Objective:  Vital signs in last 24 hours: Temp:  [97.7 F (36.5 C)-99 F (37.2 C)] 97.8 F (36.6 C) (08/18 0559) Pulse Rate:  [70-78] 76 (08/18 0559) Resp:  [15-16] 15 (08/18 0559) BP: (138-159)/(79-99) 151/80 (08/18 0559) SpO2:  [95 %-100 %] 96 % (08/18 0559) Last BM Date : 02/09/24 General:  Alert, in NAD Heart:  Regular rate and rhythm; no murmurs Pulm:  CTAB.  No W/R/R. Abdomen:  Soft, non-distended.  BS present.  Non-tender. Neurologic:  Alert and oriented x 4;  grossly normal neurologically. Psych:  Alert and cooperative. Normal mood and affect.  Intake/Output from previous day: 08/17 0701 - 08/18 0700 In: 960 [P.O.:960] Out: 950 [Urine:950]  Lab Results: Recent Labs    02/10/24 0851 02/11/24 0433  WBC 5.0 6.0  HGB 5.5* 8.2*  HCT 21.5* 30.1*  PLT 75* 71*   BMET Recent Labs    02/10/24 0851 02/11/24 0433  NA 139 136  K 4.0 3.6  CL 105 105  CO2 23 25  GLUCOSE 97 93  BUN 29* 25*  CREATININE 1.05* 1.19*  CALCIUM  8.1* 8.0*   LFT Recent Labs    02/10/24 0851  PROT 5.7*  ALBUMIN 2.4*  AST 34  ALT 22  ALKPHOS 187*  BILITOT 0.7   CT Lumbar Spine Wo Contrast Result Date: 02/10/2024 CLINICAL DATA:  Low back pain.  History of duodenal adenocarcinoma. EXAM: CT LUMBAR SPINE WITHOUT CONTRAST TECHNIQUE: Multidetector CT imaging of the lumbar spine was performed without intravenous contrast administration. Multiplanar CT image reconstructions were also generated. RADIATION DOSE REDUCTION: This exam was performed according to the departmental dose-optimization program which includes automated exposure control, adjustment of the mA and/or kV according to patient size and/or use of iterative reconstruction technique.  COMPARISON:  Lumbar spine CT 05/11/2023 and MRI 05/13/2023. Outside CT abdomen and pelvis 10/20/2023 from [facility] FINDINGS: Segmentation: 5 lumbar type vertebrae. Alignment: Mild lumbar dextroscoliosis. Chronic grade 1 anterolisthesis of L4 on L5. Vertebrae: Chronic L2 burst fracture with severe central vertebral body height loss and 6 mm retropulsion, similar to the 10/20/2023 CT. Mild L3 compression fracture with 25% vertebral body height loss, new from 05/11/2023 and with mildly progressive height loss since 10/20/2023. New, likely acute L1 compression fracture with 25% height loss and no retropulsion. Hemangiomas in the T12 and L3 vertebral bodies. Old sacral insufficiency fractures. Old posterior right twelfth rib fracture. Paraspinal and other soft tissues: Aortic atherosclerosis. Subcutaneous edema throughout the back. Partially visualized small bowel lipoma. Partially visualized embolization coils in the region of the duodenum and pancreatic head. Disc levels: L2 retropulsion is again noted to result in moderate spinal stenosis. Mild spinal stenosis at L4-5. IMPRESSION: 1. Acute L1 compression fracture. 2. Mild L3 compression fracture with progressive height loss since 10/20/2023. 3. 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 4.  Aortic Atherosclerosis (ICD10-I70.0). Electronically Signed   By: Dasie Hamburg M.D.   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 Dasie) versus chemotherapy, who was admitted with worsening back pain and  found to have new acute L1 compression fracture, as well as acute on chronic anemia, with hemoglobin 5.5 down from baseline 7-8.  Stool Hemoccult positive, but no overt GI bleeding, patient with chronic dark stool secondary to oral iron  therapy.   Hemodynamically stable. Low concern for acute, high risk bleed based on clinical presentation.  Patient has known duodenal malignancy, which is likely the source of her chronic bleeding. Unlikely that EGD would be able to provide any definitive hemostasis, and given her acute lumbar fracture as well as other sedation risk factors, I would advise against pursuing an endoscopy at this time. I would recommend holding her Eliquis  for couple days to help achieve hemostasis spontaneously, treating with PPI and avoiding all NSAIDs if possible.   Acute on chronic anemia, likely secondary to chronic bleeding from ampullary mass:  Has received two units of PRBCs and Hgb is now 8.2 grams.  Monitor and transfuse further prn. - Recommend against endoscopy currently; if patient does show evidence of overt bleeding, EGD can be performed, but hemostasis measures will likely be limited to temporary measure such as Hemospray/hemostatic gel - Recommend holding Eliquis  for few days to help achieve spontaneous hemostasis - Start pantoprazole  40 mg IV twice daily - Reconsider benefits of chronic diclofenac (if no significant pain benefit, would stop) - Patient would likely benefit from IV iron  either while hospitalized or as outpatient   Ampullary adenocarcinoma, failed endoscopic resection - Has follow-up with Dr. Ezzard next month to consider chemotherapy versus proceeding with Whipple   Acute L1 compression fracture - Pain control per hospitalist   Atrial fibrillation - Recommend holding Eliquis  as above - Continue amiodarone , diltiazem   **Dr. Wilhelmenia to discuss with Dr. Dasie regarding thoughts on EGD.   LOS: 1 day   Harlene BIRCH. Hakan Nudelman  02/12/2024, 9:27 AM

## 2024-02-12 NOTE — TOC Initial Note (Signed)
 Transition of Care [facility]) - Initial/Assessment Note    Patient Details  Name: Peggy Armstrong MRN: [id number] Date of Birth: 04/04/1946  [facility] CM/SW Contact:    Peggy JONELLE Rex, RN Phone Number: 02/12/2024, 2:51 PM  Clinical Narrative:  Met with patient at bedside to introduce role of TOC/NCM and review for dc planning, PT recommendation for short term rehab/SNF, patient declined, agreeable to [facility] PT/OT/RN, states she was receiving [facility] services with [facility] prior to admission. MD entered order for [facility] PT/HHA/SW, [facility], rep-Kelly , accepted referral, added to AVS. Patient has PCP on file, reports she resides with her daughter and grand daughter, home DME: RW, W/C, BSC, shower chair.  Patient requesting PTAR transport at discharge. Reports he daughter is currently receiving chemotherapy and will be unable to transport her home. TOC will continue to follow.                  Expected Discharge Plan: Home w Home Health Services Barriers to Discharge: Continued Medical Work up   Patient Goals and CMS Choice Patient states their goals for this hospitalization and ongoing recovery are:: return home CMS Medicare.gov Compare Post Acute Care list provided to:: Patient Choice offered to / list presented to : Patient [place] ownership interest in [facility].provided to:: Patient    Expected Discharge Plan and Services       Living arrangements for the past 2 months: Apartment                                      Prior Living Arrangements/Services Living arrangements for the past 2 months: Apartment Lives with:: Adult Children Patient language and need for interpreter reviewed:: Yes Do you feel safe going back to the place where you live?: Yes      Need for Family Participation in Patient Care: Yes (Comment) Care giver support system in place?: Yes (comment) Current home services: DME (RN/OT/PT with [facility], Has RW, W/C, BSC, Nurse, mental health) Criminal Activity/Legal Involvement Pertinent to Current Situation/Hospitalization: No - Comment as needed  Activities of Daily Living   ADL Screening (condition at time of admission) Independently performs ADLs?: No Does the patient have a NEW difficulty with bathing/dressing/toileting/self-feeding that is expected to last >3 days?: No Does the patient have a NEW difficulty with getting in/out of bed, walking, or climbing stairs that is expected to last >3 days?: No Does the patient have a NEW difficulty with communication that is expected to last >3 days?: No Is the patient deaf or have difficulty hearing?: No Does the patient have difficulty seeing, even when wearing glasses/contacts?: No Does the patient have difficulty concentrating, remembering, or making decisions?: No  Permission Sought/Granted                  Emotional Assessment Appearance:: Appears stated age Attitude/Demeanor/Rapport: Engaged Affect (typically observed): Accepting Orientation: : Oriented to Self, Oriented to Place, Oriented to  Time, Oriented to Situation Alcohol / Substance Use: Not Applicable Psych Involvement: No (comment)  Admission diagnosis:  Severe anemia [D64.9] Patient Active Problem List   Diagnosis Date Noted   Severe anemia 02/10/2024   Closed compression fracture of body of L1 vertebra (HCC) 02/10/2024   Lumbar radiculopathy 12/15/2023   Acquired hypothyroidism    Fall at home, initial encounter 10/21/2023   Left humeral fracture 10/21/2023   Left elbow avulsion fracture 10/21/2023  History of GI bleed 10/21/2023   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   History of depression 10/21/2023   Atrial fibrillation (HCC)    Back injury    DVT (deep venous thrombosis) (HCC)    Hypertension    Cellulitis of left arm 08/14/2023   Hemorrhagic shock (HCC) 07/21/2023   ABLA (acute blood loss anemia) 07/21/2023   Acute GI bleeding 07/21/2023   AKI (acute kidney injury) (HCC)  07/21/2023   GI bleed 07/20/2023   Chronic low back pain 07/18/2023   Upper GI bleed 06/20/2023   Long term current use of anticoagulant therapy 06/20/2023   Occult blood in stools 05/16/2023   Gastric polyps 05/16/2023   Generalized weakness 05/12/2023   Acute cystitis 05/12/2023   Dehydration 05/12/2023   Acute prerenal azotemia 05/12/2023   Paroxysmal atrial fibrillation (HCC) 05/12/2023   AAA (abdominal aortic aneurysm) (HCC) 05/12/2023   Hyperlipidemia 05/12/2023   GAD (generalized anxiety disorder) 05/12/2023   Hypothyroidism 05/12/2023   Closed compression fracture of L2 lumbar vertebra, initial encounter (HCC) 05/11/2023   Restless leg 12/02/2022   Routine general medical examination at a health care facility 12/13/2019   Chronic diastolic CHF (congestive heart failure) (HCC) 11/08/2019   Morbid obesity (HCC) 11/08/2019   History of DVT (deep vein thrombosis) 11/08/2019   Need for immunization against influenza 07/18/2019   Right shoulder pain 07/26/2018   Need for vaccination 04/23/2018   Duodenal ulcer    Hematemesis 11/05/2017   Macrocytic anemia 11/05/2017   Melena 11/05/2017   Nausea & vomiting    Hypokalemia 07/03/2017   Essential hypertension    Gout    Clotting disorder (HCC)    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 [place] [address] Phone: [phone] Fax:  [phone]     Social Drivers of Health (SDOH) Social History: SDOH Screenings   Food Insecurity: No Food Insecurity (02/10/2024)  Housing: Low Risk  (02/10/2024)  Transportation Needs: No Transportation Needs (02/10/2024)  Utilities: Not At Risk (02/10/2024)  Depression (PHQ2-9): Low Risk  (12/22/2023)  Financial Resource Strain: Low Risk  (12/02/2022)   Received from [facility]  Physical Activity: Unknown (04/05/2022)   Received from [facility]  Social Connections: Moderately Isolated (02/10/2024)  Stress: No Stress Concern Present (04/05/2022)   Received from [facility]  Tobacco Use: Medium Risk (02/10/2024)   SDOH Interventions:     Readmission Risk Interventions    02/12/2024    2:43 PM 07/24/2023    5:23 PM 05/14/2023    6:44 PM  Readmission Risk Prevention Plan  Transportation Screening Complete Complete Complete  PCP or Specialist Appt within 3-5 Days Complete Complete Complete  HRI or Home Care Consult Complete Complete Complete  Social Work Consult for Recovery Care Planning/Counseling  Complete Complete  Palliative Care Screening Not Applicable Not Applicable Complete  Medication Review Oceanographer) Complete Referral to  Pharmacy Referral to Pharmacy

## 2024-02-12 NOTE — Plan of Care (Signed)
  Problem: Pain Managment: Goal: General experience of comfort will improve and/or be controlled Outcome: Progressing

## 2024-02-12 NOTE — Progress Notes (Signed)
  Progress Note   Patient: Peggy Armstrong FMW:[id number] DOB: 19-Oct-1945 DOA: 02/10/2024     1 DOS: the patient was seen and examined on 02/12/2024   Brief hospital course: 78 year old woman complex PMH including inoperable duodenal adenocarcinoma, rheumatoid arthritis, CKD, multiple spontaneous vertebral compression fractures, who presented to the Emergency Department for new back pain.  Evaluation was notable for profound anemia and CT showed acute L1 compression fracture.  She was admitted for transfusion, pain control.  GI was consulted.   Consultants [facility]  Procedures/Events 8/16 admit for severe anemia, new L1 compression fx with acute pain  Assessment and Plan: Acute on chronic anemia, macrocytic, asymptomatic Severe anemia hemoglobin 5.5.  Transfused 2 units 8/16 with appropriate rise. GI consultation appreciated.  Holding Eliquis .  No endoscopy planned at this time. Repeat hemoglobin today   Acute L1 compression fracture Pain control, early ambulation.  Pain still poorly controlled.  Will make adjustments to analgesics. Consult TOC, physical therapy is recommending SNF.  Patient refusing SNF plans to return home.   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  Will adjust pain medications to better control back pain.  Continue work with therapy.  Patient refuses SNF, likely discharge home tomorrow with maximum home health.    Subjective:  Has a lot of back pain, and bilateral side pain Working with PT No bleeding Will  not go to SNF  Physical Exam: Vitals:   02/11/24 1449 02/11/24 1815 02/11/24 2128 02/12/24 0559  BP: (!) 139/99 (!) 138/94 (!) 159/79 (!) 151/80  Pulse: 70 74 78 76  Resp: 16 16 15 15   Temp: 99 F (37.2 C) 97.7 F (36.5 C) 98.4 F (36.9 C) 97.8 F (36.6 C)  TempSrc:   Oral Oral  SpO2: 95% 100% 95% 96%  Weight:      Height:       Physical Exam Vitals reviewed.  Constitutional:      General: She is not in acute distress.    Appearance: She is not ill-appearing or toxic-appearing.  Cardiovascular:     Rate and Rhythm: Normal rate and regular rhythm.     Heart sounds: No murmur heard. Pulmonary:     Effort: Pulmonary effort is normal. No respiratory distress.     Breath sounds: No wheezing, rhonchi or rales.  Musculoskeletal:     Right lower leg: No edema.     Left lower leg: No edema.     Comments: Moves bilateral lower extremities to command with good movement.  Sensation grossly intact.  Neurological:     Mental Status: She is alert.  Psychiatric:        Mood and Affect: Mood normal.        Behavior: Behavior normal.     Data Reviewed: K+ 3.4 > replete CBC pending  Family Communication: none  Disposition: Status is: Inpatient Remains inpatient appropriate because: anemia, uncontrolled back pain     Time spent: 25 minutes  Author: Toribio Door, MD 02/12/2024 11:01 AM  For on call review [email or web address].

## 2024-02-12 NOTE — Telephone Encounter (Signed)
 Called daughter [name], she is at [employer]. Has back fracture kidney stones, hemoglobin dropped to 5.1.[name] PCP is aware and will monitor.

## 2024-02-12 NOTE — Progress Notes (Signed)
 Orthopedic Tech Progress Note Patient Details:  Peggy Armstrong 1946-02-17 [id number] Applied TLSO per order and instructed pt on how to reapply and take off brace.  Ortho Devices Type of Ortho Device: Thoracolumbar corset (TLSO) Ortho Device/Splint Interventions: Ordered, Application, Adjustment   Post Interventions Patient Tolerated: Well Instructions Provided: Care of device, Poper ambulation with device, Adjustment of device  Morna Pink 02/12/2024, 7:40 PM

## 2024-02-13 DIAGNOSIS — D500 Iron deficiency anemia secondary to blood loss (chronic): Secondary | ICD-10-CM | POA: Diagnosis not present

## 2024-02-13 DIAGNOSIS — C170 Malignant neoplasm of duodenum: Secondary | ICD-10-CM | POA: Diagnosis not present

## 2024-02-13 DIAGNOSIS — Z7901 Long term (current) use of anticoagulants: Secondary | ICD-10-CM

## 2024-02-13 DIAGNOSIS — D649 Anemia, unspecified: Secondary | ICD-10-CM | POA: Diagnosis not present

## 2024-02-13 DIAGNOSIS — I484 Atypical atrial flutter: Secondary | ICD-10-CM | POA: Diagnosis not present

## 2024-02-13 LAB — CBC
HCT: 30.2 % — ABNORMAL LOW (ref 36.0–46.0)
Hemoglobin: 8.3 g/dL — ABNORMAL LOW (ref 12.0–15.0)
MCH: 26.9 pg (ref 26.0–34.0)
MCHC: 27.5 g/dL — ABNORMAL LOW (ref 30.0–36.0)
MCV: 98.1 fL (ref 80.0–100.0)
Platelets: 90 K/uL — ABNORMAL LOW (ref 150–400)
RBC: 3.08 MIL/uL — ABNORMAL LOW (ref 3.87–5.11)
RDW: 18.4 % — ABNORMAL HIGH (ref 11.5–15.5)
WBC: 6.3 K/uL (ref 4.0–10.5)
nRBC: 0 % (ref 0.0–0.2)

## 2024-02-13 MED ORDER — IPRATROPIUM-ALBUTEROL 0.5-2.5 (3) MG/3ML IN SOLN
RESPIRATORY_TRACT | Status: AC
  Filled 2024-02-13: qty 3

## 2024-02-13 MED ORDER — SODIUM CHLORIDE 0.9 % IV SOLN: INTRAVENOUS | Status: AC

## 2024-02-13 MED ORDER — IPRATROPIUM-ALBUTEROL 0.5-2.5 (3) MG/3ML IN SOLN
3.0000 mL | Freq: Four times a day (QID) | RESPIRATORY_TRACT | Status: AC | PRN
  Administered 2024-02-13 – 2024-02-14 (×2): 3 mL via RESPIRATORY_TRACT

## 2024-02-13 NOTE — Progress Notes (Signed)
 [facility] Progress Note  CC:  Severe anemia   Subjective:  Hgb stable at 8.3 grams today.  No further BM or sign of bleeding.  Objective:  Vital signs in last 24 hours: Temp:  [97.5 F (36.4 C)-98.6 F (37 C)] 97.5 F (36.4 C) (08/19 0919) Pulse Rate:  [65-125] 66 (08/19 0919) Resp:  [16-18] 18 (08/19 0629) BP: (125-144)/(72-97) 141/87 (08/19 0919) SpO2:  [97 %-99 %] 97 % (08/19 0919) Last BM Date : 02/09/24 General:  Alert, in NAD Heart:  Regular rate and rhythm; no murmurs Pulm:  CTAB.  Abdomen:  Soft, non-distended.  BS present.  Non-tender. Extremities:  Without edema. Neurologic:  Alert and oriented x 4;  grossly normal neurologically. Psych:  Alert and cooperative. Normal mood and affect.  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 Dasie) versus chemotherapy, who was admitted with worsening back pain and found to have new acute L1 compression fracture, as well as acute on chronic anemia, with hemoglobin 5.5 down from baseline 7-8.  Stool Hemoccult positive, but no overt GI bleeding, patient with chronic dark stool secondary to oral iron  therapy.  Hemodynamically stable. Low concern for acute, high risk bleed based on clinical presentation.  Patient has known duodenal malignancy, which is likely the source of her chronic bleeding. Unlikely that EGD would be able to provide any definitive  hemostasis, and given her acute lumbar fracture as well as other sedation risk factors, I would advise against pursuing an endoscopy at this time. I would recommend holding her Eliquis  for couple days to help achieve hemostasis spontaneously, treating with PPI and avoiding all NSAIDs if possible.   Acute on chronic anemia, likely secondary to chronic bleeding from ampullary mass:  Has received two units of PRBCs and Hgb is now stable at 8.3 grams.  Monitor and transfuse further prn. - Recommend against endoscopy currently; if patient does show evidence of overt bleeding, EGD can be performed, but hemostasis measures will likely be limited to temporary measure such as Hemospray/hemostatic gel - Recommend holding Eliquis  for few days to help achieve spontaneous hemostasis - Start pantoprazole  40 mg IV twice daily - Reconsider benefits of chronic diclofenac (if no significant pain benefit, would stop) - Patient would likely benefit from IV iron  either while hospitalized or as outpatient   Ampullary adenocarcinoma, failed endoscopic resection - Has follow-up with Dr. Ezzard next month to consider chemotherapy versus proceeding with Whipple with Dr. Dasie   Acute L1 compression fracture - Pain control per hospitalist   Atrial fibrillation - Recommend holding Eliquis  as above - Continue amiodarone , diltiazem   **After discussing with Dr. Wilhelmenia yesterday, the patient would like to proceed with EGD for him to look and see if he can find source of bleeding and do anything for it.  Will plan tentatively for 8/20.    LOS: 2  days   Harlene BIRCH. Cheyenne Bordeaux  02/13/2024, 10:11 AM

## 2024-02-13 NOTE — Anesthesia Preprocedure Evaluation (Addendum)
 Anesthesia Evaluation  Patient identified by MRN, date of birth, ID band Patient awake    Reviewed: Allergy & Precautions, NPO status , Patient's Chart, lab work & pertinent test results  History of Anesthesia Complications Negative for: history of anesthetic complications  Airway Mallampati: I  TM Distance: >3 FB Neck ROM: Full    Dental no notable dental hx. (+) Edentulous Upper, Edentulous Lower   Pulmonary former smoker   Pulmonary exam normal breath sounds clear to auscultation       Cardiovascular hypertension, (-) angina (-) Past MI Normal cardiovascular exam Rhythm:Regular Rate:Normal  05/2023 echo  1. Left ventricular ejection fraction, by estimation, is 55 to 60%. The  left ventricle has normal function. The left ventricle has no regional  wall motion abnormalities. The left ventricular internal cavity size was  mildly dilated. Left ventricular  diastolic parameters were normal.   2. Right ventricular systolic function is normal. The right ventricular  size is normal. There is mildly elevated pulmonary artery systolic  pressure.   3. Left atrial size was moderately dilated.   4. The mitral valve is degenerative. Trivial mitral valve regurgitation.  No evidence of mitral stenosis.   5. The aortic valve was not well visualized. Aortic valve regurgitation  is not visualized. No aortic stenosis is present.   6. The inferior vena cava is normal in size with greater than 50%  respiratory variability, suggesting right atrial pressure of 3 mmHg.      Neuro/Psych  PSYCHIATRIC DISORDERS Anxiety      Neuromuscular disease    GI/Hepatic PUD,  Medicated and Controlled,,Inoperable duodenal carcinoma   Endo/Other  Hypothyroidism    Renal/GU Renal InsufficiencyRenal diseaseLab Results      Component                Value               Date                                K                        3.4 (L)              02/12/2024                         CREATININE               0.85                02/12/2024                    Musculoskeletal  (+) Arthritis , Osteoarthritis,  Multiple pathologic rib fx   Abdominal   Peds  Hematology  (+) Blood dyscrasia, anemia Lab Results      Component                Value               Date                              HGB                      8.3 (L)  02/13/2024                HCT                      30.2 (L)            02/13/2024                 PLT                      90 (L)              02/13/2024              Anesthesia Other Findings All: Codeine, Tape  Reproductive/Obstetrics                              Anesthesia Physical Anesthesia Plan  ASA: 4  Anesthesia Plan: MAC   Post-op Pain Management: Minimal or no pain anticipated   Induction:   PONV Risk Score and Plan: Treatment may vary due to age or medical condition and Propofol  infusion  Airway Management Planned: Natural Airway and Nasal Cannula  Additional Equipment: None  Intra-op Plan:   Post-operative Plan:   Informed Consent: I have reviewed the patients History and Physical, chart, labs and discussed the procedure including the risks, benefits and alternatives for the proposed anesthesia with the patient or authorized representative who has indicated his/her understanding and acceptance.     Dental advisory given  Plan Discussed with: CRNA  Anesthesia Plan Comments: (EGD for GI Bleed)         Anesthesia Quick Evaluation

## 2024-02-13 NOTE — H&P (View-Only) (Signed)
 [facility] Progress Note  CC:  Severe anemia   Subjective:  Hgb stable at 8.3 grams today.  No further BM or sign of bleeding.  Objective:  Vital signs in last 24 hours: Temp:  [97.5 F (36.4 C)-98.6 F (37 C)] 97.5 F (36.4 C) (08/19 0919) Pulse Rate:  [65-125] 66 (08/19 0919) Resp:  [16-18] 18 (08/19 0629) BP: (125-144)/(72-97) 141/87 (08/19 0919) SpO2:  [97 %-99 %] 97 % (08/19 0919) Last BM Date : 02/09/24 General:  Alert, in NAD Heart:  Regular rate and rhythm; no murmurs Pulm:  CTAB.  Abdomen:  Soft, non-distended.  BS present.  Non-tender. Extremities:  Without edema. Neurologic:  Alert and oriented x 4;  grossly normal neurologically. Psych:  Alert and cooperative. Normal mood and affect.  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 Dasie) versus chemotherapy, who was admitted with worsening back pain and found to have new acute L1 compression fracture, as well as acute on chronic anemia, with hemoglobin 5.5 down from baseline 7-8.  Stool Hemoccult positive, but no overt GI bleeding, patient with chronic dark stool secondary to oral iron  therapy.  Hemodynamically stable. Low concern for acute, high risk bleed based on clinical presentation.  Patient has known duodenal malignancy, which is likely the source of her chronic bleeding. Unlikely that EGD would be able to provide any definitive  hemostasis, and given her acute lumbar fracture as well as other sedation risk factors, I would advise against pursuing an endoscopy at this time. I would recommend holding her Eliquis  for couple days to help achieve hemostasis spontaneously, treating with PPI and avoiding all NSAIDs if possible.   Acute on chronic anemia, likely secondary to chronic bleeding from ampullary mass:  Has received two units of PRBCs and Hgb is now stable at 8.3 grams.  Monitor and transfuse further prn. - Recommend against endoscopy currently; if patient does show evidence of overt bleeding, EGD can be performed, but hemostasis measures will likely be limited to temporary measure such as Hemospray/hemostatic gel - Recommend holding Eliquis  for few days to help achieve spontaneous hemostasis - Start pantoprazole  40 mg IV twice daily - Reconsider benefits of chronic diclofenac (if no significant pain benefit, would stop) - Patient would likely benefit from IV iron  either while hospitalized or as outpatient   Ampullary adenocarcinoma, failed endoscopic resection - Has follow-up with Dr. Ezzard next month to consider chemotherapy versus proceeding with Whipple with Dr. Dasie   Acute L1 compression fracture - Pain control per hospitalist   Atrial fibrillation - Recommend holding Eliquis  as above - Continue amiodarone , diltiazem   **After discussing with Dr. Wilhelmenia yesterday, the patient would like to proceed with EGD for him to look and see if he can find source of bleeding and do anything for it.  Will plan tentatively for 8/20.    LOS: 2  days   Peggy Armstrong. Cheyenne Bordeaux  02/13/2024, 10:11 AM

## 2024-02-13 NOTE — Telephone Encounter (Signed)
 Patient with diagnosis of atrial fibrillation on Eliquis  for anticoagulation.   (Has history of unprovoked DVT in 2017)  Procedure:  Whipple   Date of Surgery:  Clearance 03/05/24   CHA2DS2-VASc Score = 5   This indicates a 7.2% annual risk of stroke. The patient's score is based upon: CHF History: 1 HTN History: 1 Diabetes History: 0 Stroke History: 0 Vascular Disease History: 0 Age Score: 2 Gender Score: 1   CrCl 54 (with adjusted body weight) Platelet count 90  Patient has not had an Afib/aflutter ablation within the last 3 months or DCCV within the last 30 days  Per office protocol, patient can hold Eliquis  for 3 days prior to procedure.   Patient will not need bridging with Lovenox  (enoxaparin ) around procedure.  **This guidance is not considered finalized until pre-operative APP has relayed final recommendations.**

## 2024-02-13 NOTE — Progress Notes (Signed)
 Hemoglobin stabilizes.  She eventually elected to have endoscopy which will be performed 8/20.  Pain control and supportive care for Progress Note   Patient: Peggy Armstrong FMW:[id number] DOB: [birth year] DOA: 02/10/2024     2 DOS: the patient was seen and examined on 02/13/2024   Brief hospital course: 78 year old woman complex PMH including inoperable duodenal adenocarcinoma, rheumatoid arthritis, CKD, multiple spontaneous vertebral compression fractures, who presented to the Emergency Department for new back pain.  Evaluation was notable for profound anemia and CT showed acute L1 compression fracture.  She was admitted for transfusion, pain control.  GI was consulted.  Hemoglobin stabilized, she eventually elected endoscopy which will be performed tomorrow.  She refuses SNF and will discharge home with home health when ready.  Consultants [facility]  Procedures/Events 8/16 admit for severe anemia, new L1 compression fx with acute pain  Assessment and Plan: Acute on chronic anemia, macrocytic, asymptomatic Severe anemia hemoglobin 5.5.  Transfused 2 units 8/16 with appropriate rise.  Hemoglobin remained stable. GI consultation appreciated.  Holding Eliquis .  Plan for endoscopy tomorrow.   Acute L1 compression fracture Pain control, early ambulation.  Consult TOC, physical therapy is recommending SNF.  Patient refusing SNF plans to return home.   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  Constitutional:      General: She is not in acute distress.    Appearance: She is not ill-appearing or toxic-appearing.  Cardiovascular:     Rate and Rhythm: Normal rate and regular rhythm.     Heart sounds: No murmur heard. Pulmonary:     Effort: Pulmonary effort is normal. No respiratory distress.     Breath sounds: No wheezing, rhonchi or rales.  Neurological:     Mental Status: She is alert.  Psychiatric:        Mood and Affect: Mood normal.        Behavior: Behavior normal.     Data Reviewed: Hgb stable 8.3  Family Communication: none  Disposition: Status is: Inpatient Remains inpatient appropriate because: anemia     Time spent: 20 minutes  Author: Toribio Door, MD 02/13/2024 5:34 PM  For on call review [email or web address].

## 2024-02-13 NOTE — Plan of Care (Signed)

## 2024-02-13 NOTE — Plan of Care (Signed)

## 2024-02-14 ENCOUNTER — Encounter (HOSPITAL_COMMUNITY)
Admission: EM | Disposition: A | Discharge status: Skilled Nursing Facility | Payer: Self-pay | Source: Home / Self Care | Attending: Internal Medicine

## 2024-02-14 ENCOUNTER — Inpatient Hospital Stay (HOSPITAL_COMMUNITY): Admitting: Anesthesiology

## 2024-02-14 ENCOUNTER — Encounter (HOSPITAL_COMMUNITY): Payer: Self-pay | Admitting: Family Medicine

## 2024-02-14 ENCOUNTER — Inpatient Hospital Stay (HOSPITAL_COMMUNITY)

## 2024-02-14 ENCOUNTER — Encounter (HOSPITAL_COMMUNITY): Admitting: Anesthesiology

## 2024-02-14 DIAGNOSIS — C170 Malignant neoplasm of duodenum: Secondary | ICD-10-CM

## 2024-02-14 DIAGNOSIS — I10 Essential (primary) hypertension: Secondary | ICD-10-CM

## 2024-02-14 DIAGNOSIS — D649 Anemia, unspecified: Secondary | ICD-10-CM

## 2024-02-14 DIAGNOSIS — K449 Diaphragmatic hernia without obstruction or gangrene: Secondary | ICD-10-CM | POA: Diagnosis not present

## 2024-02-14 DIAGNOSIS — K3189 Other diseases of stomach and duodenum: Secondary | ICD-10-CM | POA: Diagnosis not present

## 2024-02-14 DIAGNOSIS — K2289 Other specified disease of esophagus: Secondary | ICD-10-CM

## 2024-02-14 DIAGNOSIS — K317 Polyp of stomach and duodenum: Secondary | ICD-10-CM

## 2024-02-14 HISTORY — DX: Anemia, unspecified: D64.9

## 2024-02-14 HISTORY — PX: ESOPHAGOGASTRODUODENOSCOPY: SHX5428

## 2024-02-14 LAB — MAGNESIUM: Magnesium: 1.8 mg/dL (ref 1.7–2.4)

## 2024-02-14 LAB — VITAMIN D 25 HYDROXY (VIT D DEFICIENCY, FRACTURES): Vit D, 25-Hydroxy: 46.78 ng/mL (ref 30–100)

## 2024-02-14 LAB — BASIC METABOLIC PANEL WITH GFR
Anion gap: 5 (ref 5–15)
BUN: 22 mg/dL (ref 8–23)
CO2: 26 mmol/L (ref 22–32)
Calcium: 8.4 mg/dL — ABNORMAL LOW (ref 8.9–10.3)
Chloride: 107 mmol/L (ref 98–111)
Creatinine, Ser: 1.06 mg/dL — ABNORMAL HIGH (ref 0.44–1.00)
GFR, Estimated: 54 mL/min — ABNORMAL LOW (ref >60)
Glucose, Bld: 89 mg/dL (ref 70–99)
Potassium: 4.3 mmol/L (ref 3.5–5.1)
Sodium: 138 mmol/L (ref 135–145)

## 2024-02-14 LAB — IRON AND TIBC
Iron: 13 ug/dL — ABNORMAL LOW (ref 28–170)
Saturation Ratios: 4 % — ABNORMAL LOW (ref 10.4–31.8)
TIBC: 374 ug/dL (ref 250–450)
UIBC: 361 ug/dL

## 2024-02-14 LAB — CBC
HCT: 30.7 % — ABNORMAL LOW (ref 36.0–46.0)
Hemoglobin: 8.5 g/dL — ABNORMAL LOW (ref 12.0–15.0)
MCH: 27.7 pg (ref 26.0–34.0)
MCHC: 27.7 g/dL — ABNORMAL LOW (ref 30.0–36.0)
MCV: 100 fL (ref 80.0–100.0)
Platelets: 100 K/uL — ABNORMAL LOW (ref 150–400)
RBC: 3.07 MIL/uL — ABNORMAL LOW (ref 3.87–5.11)
RDW: 17.8 % — ABNORMAL HIGH (ref 11.5–15.5)
WBC: 5.6 K/uL (ref 4.0–10.5)
nRBC: 0 % (ref 0.0–0.2)

## 2024-02-14 LAB — PHOSPHORUS: Phosphorus: 3.4 mg/dL (ref 2.5–4.6)

## 2024-02-14 SURGERY — EGD (ESOPHAGOGASTRODUODENOSCOPY)
Anesthesia: Monitor Anesthesia Care

## 2024-02-14 MED ORDER — FUROSEMIDE 10 MG/ML IJ SOLN
20.0000 mg | Freq: Once | INTRAMUSCULAR | Status: AC
  Administered 2024-02-14: 20 mg via INTRAVENOUS
  Filled 2024-02-14: qty 2

## 2024-02-14 MED ORDER — ARFORMOTEROL TARTRATE 15 MCG/2ML IN NEBU
15.0000 ug | INHALATION_SOLUTION | Freq: Two times a day (BID) | RESPIRATORY_TRACT | Status: DC
  Administered 2024-02-15 – 2024-02-25 (×21): 15 ug via RESPIRATORY_TRACT
  Filled 2024-02-14 (×21): qty 2

## 2024-02-14 MED ORDER — METHYLPREDNISOLONE SODIUM SUCC 40 MG IJ SOLR
40.0000 mg | Freq: Two times a day (BID) | INTRAMUSCULAR | Status: DC
  Administered 2024-02-14 – 2024-02-15 (×2): 40 mg via INTRAVENOUS
  Filled 2024-02-14 (×2): qty 1

## 2024-02-14 MED ORDER — SODIUM CHLORIDE 0.9 % IV SOLN: INTRAVENOUS | Status: DC | PRN

## 2024-02-14 MED ORDER — LIDOCAINE 2% (20 MG/ML) 5 ML SYRINGE
INTRAMUSCULAR | Status: DC | PRN
  Administered 2024-02-14: 60 mg via INTRAVENOUS

## 2024-02-14 MED ORDER — IPRATROPIUM-ALBUTEROL 0.5-2.5 (3) MG/3ML IN SOLN
3.0000 mL | RESPIRATORY_TRACT | Status: DC | PRN
  Administered 2024-02-14: 3 mL via RESPIRATORY_TRACT
  Filled 2024-02-14: qty 3

## 2024-02-14 MED ORDER — PANTOPRAZOLE SODIUM 40 MG PO TBEC
40.0000 mg | DELAYED_RELEASE_TABLET | Freq: Two times a day (BID) | ORAL | Status: DC
  Administered 2024-02-14 – 2024-02-25 (×23): 40 mg via ORAL
  Filled 2024-02-14 (×24): qty 1

## 2024-02-14 MED ORDER — PROPOFOL 500 MG/50ML IV EMUL
INTRAVENOUS | Status: DC | PRN
  Administered 2024-02-14: 125 ug/kg/min via INTRAVENOUS

## 2024-02-14 MED ORDER — IPRATROPIUM-ALBUTEROL 0.5-2.5 (3) MG/3ML IN SOLN
3.0000 mL | Freq: Four times a day (QID) | RESPIRATORY_TRACT | Status: DC
  Administered 2024-02-15 – 2024-02-16 (×6): 3 mL via RESPIRATORY_TRACT
  Filled 2024-02-14 (×6): qty 3

## 2024-02-14 MED ORDER — IRON SUCROSE 300 MG IVPB - SIMPLE MED
300.0000 mg | Freq: Once | Status: AC
  Administered 2024-02-14: 300 mg via INTRAVENOUS
  Filled 2024-02-14: qty 300

## 2024-02-14 MED ORDER — IPRATROPIUM-ALBUTEROL 0.5-2.5 (3) MG/3ML IN SOLN
RESPIRATORY_TRACT | Status: AC
  Filled 2024-02-14: qty 3

## 2024-02-14 MED ORDER — FUROSEMIDE 10 MG/ML IJ SOLN
40.0000 mg | Freq: Once | INTRAMUSCULAR | Status: AC
  Administered 2024-02-14: 40 mg via INTRAVENOUS
  Filled 2024-02-14: qty 4

## 2024-02-14 MED ORDER — SUCRALFATE 1 G PO TABS
1.0000 g | ORAL_TABLET | Freq: Two times a day (BID) | ORAL | Status: DC
  Administered 2024-02-14 – 2024-02-25 (×23): 1 g via ORAL
  Filled 2024-02-14 (×23): qty 1

## 2024-02-14 NOTE — Interval H&P Note (Signed)
 History and Physical Interval Note:  02/14/2024 1:58 PM  Peggy Armstrong  has presented today for surgery, with the diagnosis of GI bleed.  The various methods of treatment have been discussed with the patient and family. After consideration of risks, benefits and other options for treatment, the patient has consented to  Procedure(s): EGD (ESOPHAGOGASTRODUODENOSCOPY) (N/A) as a surgical intervention.  The patient's history has been reviewed, patient examined, no change in status, stable for surgery.  I have reviewed the patient's chart and labs.  Questions were answered to the patient's satisfaction.     [name]

## 2024-02-14 NOTE — Progress Notes (Signed)
 PT Cancellation Note  Patient Details Name: CLARISSIA MCKEEN MRN: [id number] DOB: [birth year]   Cancelled Treatment:     Pt off floor receiving EGD. Will re-attempt next available date/time per POC.    Darice JAYSON Bohr 02/14/2024, 1:39 PM

## 2024-02-14 NOTE — Progress Notes (Signed)
 This patient has audible expiratory wheezing, she's complaining of shortness of breath again.  Respiratory has just finished breathing treatment at 2225, and sats 96% on R/A.  She had 40 mg lasix  around 5 pm and she has  has 1800 output  of urine since.

## 2024-02-14 NOTE — Progress Notes (Addendum)
 [facility] Progress Note  Patient: Peggy Armstrong    FMW:[id number]  DOA: 02/10/2024     Date of Service: the patient was seen and examined on 02/14/2024  Chief Complaint  Patient presents with   Back Pain   Brief hospital course: 78 year old woman complex PMH including inoperable duodenal adenocarcinoma, rheumatoid arthritis, CKD, multiple spontaneous vertebral compression fractures, who presented to the Emergency Department for new back pain. Evaluation was notable for profound anemia and CT showed acute L1 compression fracture. She was admitted for transfusion, pain control. GI was consulted. Hemoglobin stabilized, she eventually elected endoscopy which will be performed tomorrow. She refuses SNF and will discharge home with home health when ready.   [facility]ult[facility]   Procedures/Events 8/16 admit for severe anemia, new L1 compression fx with acute pain 8/20 s/p EGD:  White nummular lesions in esophageal mucosa.                            Brushings performed to rule out Candida. - Multiple inflammatoryappearing gastric polyps.  - Malignant duodenal mass with mild slow oozing taking over the entirety of the duodenum 2. PuraStat injected.   Assessment and Plan: Acute on chronic anemia, macrocytic, asymptomatic Severe anemia hemoglobin 5.5.  Transfused 2 units 8/16 with appropriate rise.  Hemoglobin remained stable. GI consultation appreciated.  Holding Eliquis .   8/20 s/p EGD: Oozing duodenal malignant mass, pleural State injected by GI.  Patient remains at high risk for rebleeding if DOAC will be restarted.  # Shortness of breath and wheezing possible due to acute pulmonary edema 8/20 Lasix  20 mg x 1 dose given Started Brovana  nebulizer twice a day Started DuoNeb every 6 hourly scheduled, transition to as needed after improvement Started Solu-Medrol  40 mg IV every 12 hourly x 3 doses.  Reassess tomorrow a.m. Follow chest x-ray   # Iron  deficiency, Tsat 4%. IV Venofer   300 mg x 1 dose given Continue oral iron  supplement on discharge    Acute L1 compression fracture Pain control, early ambulation.  Consult TOC, physical therapy is recommending SNF.  Patient refusing SNF plans to return home.   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  Patient remains at high risk for rebleeding on DOAC.  Would recommend to follow-up with cardiology to restart DOAC when stable    Chronic diastolic CHF Appears stable    CKD stage IIIa Creatinine appears to be at baseline around 1.05     Body mass index is 38.45 kg/m.  Interventions:  Diet: Heart healthy diet DVT Prophylaxis: SCDs  Advance goals of care discussion: Full code  Family Communication: family was present at bedside, at the time of interview.  The pt provided permission to discuss medical plan with the family. Opportunity was given to ask question and all questions were answered satisfactorily.   Disposition:  Pt is from home, admitted with symptomatic anemia, status post EGD 8/20, still has risk of bleeding, which precludes a safe discharge. Discharge to home, when stable, most likely in 1 to 2 days.  Subjective: No significant events overnight.  Patient was seen after EGD, tolerated procedure well. Patient was very short of breath and wheezing, did not improve much with breathing treatment. Breathing treatment started as above.   Physical Exam: General: Mild to  moderate respiratory distress, SOB Appear in mild distress, affect anxious and depressed Eyes: PERRLA ENT: Oral Mucosa Clear, moist  Neck: no JVD,  Cardiovascular: S1 and S2 Present, no Murmur,  Respiratory: Equal air entry bilaterally, bilateral crackles and wheezes  Abdomen: Bowel Sound  present, Soft and no tenderness,  Skin: no rashes Extremities: mild Pedal edema, no calf tenderness Neurologic: without any new focal findings Gait not checked due to patient safety concerns  Vitals:   02/14/24 1440 02/14/24 1450 02/14/24 1500 02/14/24 1531  BP: 114/64 (!) 110/58 (!) 130/90 (!) 142/90  Pulse: 81 78 76 78  Resp: 19 19 17 17   Temp:    99.6 F (37.6 C)  TempSrc:      SpO2: 100% 95% 95% 97%  Weight:      Height:        Intake/Output Summary (Last 24 hours) at 02/14/2024 1629 Last data filed at 02/14/2024 1432 Gross per 24 hour  Intake 982.66 ml  Output 750 ml  Net 232.66 ml   Filed Weights   02/10/24 1506  Weight: 89.3 kg    Data Reviewed: I have personally reviewed and interpreted daily labs, tele strips, imagings as discussed above. I reviewed all nursing notes, pharmacy notes, vitals, pertinent old records I have discussed plan of care as described above with RN and patient/family.  CBC: Recent Labs  Lab 02/10/24 0851 02/11/24 0433 02/12/24 0931 02/13/24 0853 02/14/24 1005  WBC 5.0 6.0 6.2 6.3 5.6  NEUTROABS 3.7  --   --   --   --   HGB 5.5* 8.2* 8.4* 8.3* 8.5*  HCT 21.5* 30.1* 30.4* 30.2* 30.7*  MCV 101.4* 98.4 99.3 98.1 100.0  PLT 75* 71* 72* 90* 100*   Basic Metabolic Panel: Recent Labs  Lab 02/10/24 0851 02/11/24 0433 02/12/24 0931 02/14/24 1042  NA 139 136 138 138  K 4.0 3.6 3.4* 4.3  CL 105 105 105 107  CO2 23 25 23 26   GLUCOSE 97 93 95 89  BUN 29* 25* 20 22  CREATININE 1.05* 1.19* 0.85 1.06*  CALCIUM  8.1* 8.0* 8.2* 8.4*  MG  --   --   --  1.8  PHOS  --   --   --  3.4    Studies: No results found.  Scheduled Meds:  acetaminophen   650 mg Oral Q6H   Or   acetaminophen   650 mg Rectal Q6H   allopurinol   300 mg Oral Daily   amiodarone   200 mg Oral Daily   arformoterol   15 mcg Nebulization BID   busPIRone   10 mg Oral BID   cyanocobalamin   1,000 mcg Oral Daily   diltiazem   120 mg Oral Daily   DULoxetine   20 mg Oral Daily    furosemide   40 mg Intravenous Once   ipratropium-albuterol   3 mL Nebulization Q6H   levothyroxine   100 mcg Oral QAC breakfast   methylPREDNISolone  (SOLU-MEDROL ) injection  40 mg Intravenous Q12H   pantoprazole   40 mg Oral BID   potassium chloride  SA  20 mEq Oral QHS   sodium chloride  flush  3 mL Intravenous Q12H   sodium chloride  flush  3 mL Intravenous Q12H   sucralfate   1 g Oral BID   Continuous Infusions: PRN Meds: diphenhydrAMINE , HYDROmorphone  (DILAUDID ) injection, methocarbamol , ondansetron  **OR** ondansetron  (ZOFRAN ) IV, oxyCODONE , sodium chloride  flush  Time spent: 55 minutes  Author: ELVAN SOR. MD [facility] 02/14/2024 4:29 PM  To reach On-call, see care teams to locate the attending and reach out  to them via [email or web address]. If 7PM-7AM, please contact night-coverage If you still have difficulty reaching the attending provider, please page the [facility] (Director on Call) for [facility] on amion for assistance.

## 2024-02-14 NOTE — Transfer of Care (Signed)
 Immediate Anesthesia Transfer of Care Note  Patient: Peggy Armstrong  Procedure(s) Performed: EGD (ESOPHAGOGASTRODUODENOSCOPY)  Patient Location: PACU  Anesthesia Type:MAC  Level of Consciousness: drowsy  Airway & Oxygen Therapy: Patient Spontanous Breathing and Patient connected to face mask oxygen  Post-op Assessment: Report given to RN and Post -op Vital signs reviewed and stable  Post vital signs: Reviewed and stable  Last Vitals:  Vitals Value Taken Time  BP 114/66 02/14/24 14:38  Temp    Pulse 83 02/14/24 14:39  Resp 19 02/14/24 14:39  SpO2 100 % 02/14/24 14:39  Vitals shown include unfiled device data.  Last Pain:  Vitals:   02/14/24 1438  TempSrc:   PainSc: 0-No pain      Patients Stated Pain Goal: 2 (02/13/24 1956)  Complications: No notable events documented.

## 2024-02-14 NOTE — Op Note (Signed)
 [facility] Patient Name: Peggy Armstrong Procedure Date: 02/14/2024 MRN: [id number] Attending MD: Aloha Finner , MD, [id number] Date of Birth: 04/01/1946 CSN: [id number] Age: 78 Admit Type: Inpatient Procedure:                Upper GI endoscopy Indications:              Iron  deficiency anemia secondary to chronic blood                            loss, Occult blood in stool, Recent                            gastrointestinal bleeding, Suspected upper                            gastrointestinal bleeding Providers:                Aloha Finner, MD, Jacquelyn Jaci Pierce,                            RN, Corene Southgate, Technician Referring MD:              Medicines:                Monitored Anesthesia Care Complications:            No immediate complications. Estimated Blood Loss:     Estimated blood loss: none. Procedure:                Pre-Anesthesia Assessment:                           - Prior to the procedure, a History and Physical                            was performed, and patient medications and                            allergies were reviewed. The patient's tolerance of                            previous anesthesia was also reviewed. The risks                            and benefits of the procedure and the sedation                            options and risks were discussed with the patient.                            All questions were answered, and informed consent                            was obtained. Prior Anticoagulants: The patient has                            taken Eliquis  (apixaban ), last  dose was 4 days                            prior to procedure. ASA Grade Assessment: III - A                            patient with severe systemic disease. After                            reviewing the risks and benefits, the patient was                            deemed in satisfactory condition to undergo the                             procedure.                           After obtaining informed consent, the endoscope was                            passed under direct vision. Throughout the                            procedure, the patient's blood pressure, pulse, and                            oxygen saturations were monitored continuously. The                            [id number]-H190 ([id number]) Olympus endoscope was introduced                            through the mouth, and advanced to the second part                            of duodenum. The upper GI endoscopy was                            accomplished without difficulty. The patient                            tolerated the procedure. Scope In: Scope Out: Findings:      White nummular lesions were noted in the entire esophagus. Brushings for       cytology were obtained to rule out Candida.      The Z-line was irregular and was found 35 cm from the incisors.      A 5 cm hiatal hernia was present.      Multiple small, inflammatory appearing, semi-sessile polyps with no       bleeding were found in the cardia, in the gastric body and in the       gastric antrum.      Patchy mildly erythematous mucosa without bleeding was found in the       entire examined stomach.  No gross lesions were noted in the duodenal bulb and in the the duodenal       sweep.      A large frond-like/villous, fungating, infiltrative and ulcerated mass       with mild active slow oozing was found in the second portion of the       duodenum. This is consistent with her known duodenal adenocarcinoma. It       appears to be involving the area of the ampulla. Endoscopic therapies       are not likely to be helpful in the long-term, but to aid in process       trying to prevent more significant anemia without supportive measures,       the cancer was successfully injected with 3 mL PuraStat for hemostasis. Impression:               - White nummular lesions in esophageal mucosa.                             Brushings performed to rule out Candida.                           - Z-line irregular, 35 cm from the incisors.                           - 5 cm hiatal hernia.                           - Multiple inflammatoryappearing gastric polyps.                           - Erythematous mucosa in the stomach.                           - No gross lesions in the duodenal bulb and                            duodenal sweep.                           - Malignant duodenal mass with mild slow oozing                            taking over the entirety of the duodenum 2.                            PuraStat injected. Moderate Sedation:      Not Applicable - Patient had care per Anesthesia. Recommendation:           - The patient will be observed post-procedure,                            until all discharge criteria are met.                           - Return patient to hospital ward for ongoing care.                           -  Advance diet as tolerated.                           - Continue PPI twice daily.                           - Carafate  twice daily.                           - Observe patient's clinical course.                           - Recommend IV iron  infusions.                           - Unless surgical management is going to occur (and                            patient had concerns in regards to what her                            recovery could be) she likely will have ongoing                            chronic GI blood loss. At some point this could                            become more significant. Queried the potential role                            in future of radiation therapy, especially if she                            is a nonsurgical candidate.                           - Anticoagulation consideration/restart as per                            medicine service, but expect that she likely will                            have ongoing chronic GI blood loss if she is                             restarted on this then will need to be supported.                            Ultimately decision of medicine then patient and                            cardiology in regards to what to do. Difficult  situation that she is not currently.                           - Follow-up cytology and team/patient will be                            updated if she is found to have Candida.                           - Inpatient GI service will sign off at this time.                           - The findings and recommendations were discussed                            with the patient.                           - The findings and recommendations were discussed                            with the referring physician. Procedure Code(s):        --- Professional ---                           [phone], Esophagogastroduodenoscopy, flexible,                            transoral; with control of bleeding, any method Diagnosis Code(s):        --- Professional ---                           K22.89, Other specified disease of esophagus                           K44.9, Diaphragmatic hernia without obstruction or                            gangrene                           K31.7, Polyp of stomach and duodenum                           K31.89, Other diseases of stomach and duodenum                           C17.0, Malignant neoplasm of duodenum                           D50.0, Iron  deficiency anemia secondary to blood                            loss (chronic)  R19.5, Other fecal abnormalities                           K92.2, Gastrointestinal hemorrhage, unspecified CPT copyright 2022 American Medical Association. All rights reserved. The codes documented in this report are preliminary and upon coder review may  be revised to meet current compliance requirements. Aloha Finner, MD 02/14/2024 2:44:10 PM Number of Addenda: 0

## 2024-02-14 NOTE — Plan of Care (Signed)
   Problem: Education: Goal: Knowledge of General Education information will improve Description Including pain rating scale, medication(s)/side effects and non-pharmacologic comfort measures Outcome: Progressing   Problem: Health Behavior/Discharge Planning: Goal: Ability to manage health-related needs will improve Outcome: Progressing

## 2024-02-14 NOTE — Anesthesia Postprocedure Evaluation (Signed)
 Anesthesia Post Note  Patient: Peggy Armstrong  Procedure(s) Performed: EGD (ESOPHAGOGASTRODUODENOSCOPY)     Patient location during evaluation: Endoscopy Anesthesia Type: MAC Level of consciousness: awake and alert Pain management: pain level controlled Vital Signs Assessment: post-procedure vital signs reviewed and stable Respiratory status: spontaneous breathing, nonlabored ventilation, respiratory function stable and patient connected to nasal cannula oxygen Cardiovascular status: blood pressure returned to baseline and stable Postop Assessment: no apparent nausea or vomiting Anesthetic complications: no   No notable events documented.  Last Vitals:  Vitals:   02/14/24 1450 02/14/24 1500  BP: (!) 110/58 (!) 130/90  Pulse: 78 76  Resp: 19 17  Temp:    SpO2: 95% 95%    Last Pain:  Vitals:   02/14/24 1500  TempSrc:   PainSc: 8                  Garnette DELENA Gab

## 2024-02-15 ENCOUNTER — Telehealth: Payer: Self-pay | Admitting: Student

## 2024-02-15 ENCOUNTER — Telehealth: Payer: Self-pay | Admitting: Pharmacy Technician

## 2024-02-15 ENCOUNTER — Inpatient Hospital Stay (HOSPITAL_COMMUNITY)

## 2024-02-15 ENCOUNTER — Encounter: Payer: Self-pay | Admitting: Pulmonary Disease

## 2024-02-15 DIAGNOSIS — D62 Acute posthemorrhagic anemia: Secondary | ICD-10-CM | POA: Insufficient documentation

## 2024-02-15 DIAGNOSIS — I5032 Chronic diastolic (congestive) heart failure: Secondary | ICD-10-CM

## 2024-02-15 DIAGNOSIS — D509 Iron deficiency anemia, unspecified: Secondary | ICD-10-CM | POA: Insufficient documentation

## 2024-02-15 DIAGNOSIS — D649 Anemia, unspecified: Secondary | ICD-10-CM | POA: Diagnosis not present

## 2024-02-15 HISTORY — DX: Acute posthemorrhagic anemia: D62

## 2024-02-15 HISTORY — DX: Iron deficiency anemia, unspecified: D50.9

## 2024-02-15 LAB — BASIC METABOLIC PANEL WITH GFR
Anion gap: 10 (ref 5–15)
BUN: 20 mg/dL (ref 8–23)
CO2: 23 mmol/L (ref 22–32)
Calcium: 8.1 mg/dL — ABNORMAL LOW (ref 8.9–10.3)
Chloride: 104 mmol/L (ref 98–111)
Creatinine, Ser: 0.94 mg/dL (ref 0.44–1.00)
GFR, Estimated: >60 mL/min (ref >60)
Glucose, Bld: 117 mg/dL — ABNORMAL HIGH (ref 70–99)
Potassium: 3.6 mmol/L (ref 3.5–5.1)
Sodium: 137 mmol/L (ref 135–145)

## 2024-02-15 LAB — ECHOCARDIOGRAM COMPLETE
AR max vel: 2.07 cm2
AV Area VTI: 2.15 cm2
AV Area mean vel: 2.05 cm2
AV Mean grad: 9.7 mmHg
AV Peak grad: 17.2 mmHg
Ao pk vel: 2.07 m/s
Area-P 1/2: 3.5 cm2
Height: 60 in
S' Lateral: 3.2 cm
Weight: 3149.93 [oz_av]

## 2024-02-15 LAB — PHOSPHORUS: Phosphorus: 3.5 mg/dL (ref 2.5–4.6)

## 2024-02-15 LAB — CBC
HCT: 30.7 % — ABNORMAL LOW (ref 36.0–46.0)
Hemoglobin: 8.5 g/dL — ABNORMAL LOW (ref 12.0–15.0)
MCH: 27 pg (ref 26.0–34.0)
MCHC: 27.7 g/dL — ABNORMAL LOW (ref 30.0–36.0)
MCV: 97.5 fL (ref 80.0–100.0)
Platelets: 114 K/uL — ABNORMAL LOW (ref 150–400)
RBC: 3.15 MIL/uL — ABNORMAL LOW (ref 3.87–5.11)
RDW: 17.2 % — ABNORMAL HIGH (ref 11.5–15.5)
WBC: 2.8 K/uL — ABNORMAL LOW (ref 4.0–10.5)
nRBC: 0 % (ref 0.0–0.2)

## 2024-02-15 LAB — MAGNESIUM: Magnesium: 1.8 mg/dL (ref 1.7–2.4)

## 2024-02-15 LAB — BRAIN NATRIURETIC PEPTIDE: B Natriuretic Peptide: 633.1 pg/mL — ABNORMAL HIGH (ref 0.0–100.0)

## 2024-02-15 MED ORDER — FUROSEMIDE 10 MG/ML IJ SOLN
40.0000 mg | Freq: Once | INTRAMUSCULAR | Status: AC
  Administered 2024-02-15: 40 mg via INTRAVENOUS
  Filled 2024-02-15: qty 4

## 2024-02-15 MED ORDER — METOPROLOL TARTRATE 5 MG/5ML IV SOLN
5.0000 mg | Freq: Once | INTRAVENOUS | Status: AC
  Administered 2024-02-15: 5 mg via INTRAVENOUS
  Filled 2024-02-15: qty 5

## 2024-02-15 MED ORDER — METHYLPREDNISOLONE SODIUM SUCC 125 MG IJ SOLR
81.2500 mg | Freq: Every day | INTRAMUSCULAR | Status: AC
  Administered 2024-02-15 – 2024-02-17 (×3): 81.25 mg via INTRAVENOUS
  Filled 2024-02-15 (×3): qty 2

## 2024-02-15 MED ORDER — FUROSEMIDE 10 MG/ML IJ SOLN
INTRAMUSCULAR | Status: AC
  Filled 2024-02-15: qty 2

## 2024-02-15 NOTE — Progress Notes (Signed)
 [facility] Progress Note  Patient: Peggy Armstrong    FMW:[id number]  DOA: 02/10/2024     Date of Service: the patient was seen and examined on 02/15/2024  Chief Complaint  Patient presents with   Back Pain   Brief hospital course: 78 year old woman complex PMH including inoperable duodenal adenocarcinoma, rheumatoid arthritis, CKD, multiple spontaneous vertebral compression fractures, who presented to the Emergency Department for new back pain. Evaluation was notable for profound anemia and CT showed acute L1 compression fracture. She was admitted for transfusion, pain control. GI was consulted. Hemoglobin stabilized, she eventually elected endoscopy which will be performed tomorrow. She refuses SNF and will discharge home with home health when ready.   Consultants [facility]   Procedures/Events 8/16 admit for severe anemia, new L1 compression fx with acute pain 8/20 s/p EGD:  White nummular lesions in esophageal mucosa.                            Brushings performed to rule out Candida. - Multiple inflammatoryappearing gastric polyps.  - Malignant duodenal mass with mild slow oozing taking over the entirety of the duodenum 2. PuraStat injected.   Assessment and Plan:  # Acute on chronic anemia, macrocytic, asymptomatic Severe anemia hemoglobin 5.5.  Transfused 2 units 8/16 with appropriate rise.  Hemoglobin remained stable. GI consultation appreciated.  Holding Eliquis .   8/20 s/p EGD: Oozing duodenal malignant mass, pleural State injected by GI.  Patient remains at high risk for rebleeding if DOAC will be restarted.  # Duodenal adenocarcinoma Follow-up with primary oncologist in [place] as an outpatient 8/21 patient was seen by general surgery, recommended no surgical intervention, patient is very poor candidate, recommended radiation therapy.  General surgery will discuss with oncologist Dr. Ezzard and will refer her to radiation oncology.  Patient agreed with this plan   #  Shortness of breath and wheezing possible due to acute pulmonary edema 8/20 Lasix  40 mg and 20 mg doses given Started Brovana  nebulizer twice a day Started DuoNeb every 6 hourly scheduled, transition to as needed after improvement 8/20 Solu-Medrol  40 mg IV every 12 hourly x 5 doses.  Reassess tomorrow a.m. chest x-ray: Bilateral pulmonary edema 8/21 Lasix  40 mg one-time dose given BNP 633 elevated and TTE to rule out CHF  Acute on chronic diastolic CHF exacerbation 8/20 CHF exacerbation after EGD BNP elevated Lasix  40 and 20 mg given on 8/20 8/21, Lasix  40 mg x 1 dose given Follow-up TTE  # Iron  deficiency, Tsat 4%. IV Venofer  300 mg x 1 dose given Continue oral iron  supplement on discharge    Acute L1 compression fracture Pain control, early ambulation.  Consult TOC, physical therapy is recommending SNF.  Patient refusing SNF plans to return home.    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  Patient remains at high risk for rebleeding on DOAC.  Would recommend to follow-up with cardiology to restart DOAC when stable     CKD stage IIIa Creatinine appears to be at baseline around 1.05     Body mass index is 38.45 kg/m.  Interventions:  Diet: Heart healthy diet DVT Prophylaxis: SCDs  Advance goals of care discussion: Full code  Family Communication: family was present at bedside, at the time of interview.  The pt provided permission to discuss medical plan with the family. Opportunity was given to  ask question and all questions were answered satisfactorily.   Disposition:  Pt is from home, admitted with symptomatic anemia, status post EGD 8/20, still has risk of bleeding, which precludes a safe discharge. Discharge to home, when stable, most likely in 1 to 2 days.  Subjective: No significant events overnight.   Patient had significant shortness of breath, slightly improved after IV Lasix .  Another dose of the Lasix  last night was given. Patient still having shortness of breath, and wheezes. Denied any bleeding.  No abdominal pain.   Physical Exam: General: Mild to moderate respiratory distress, SOB Appear in mild distress, affect appropriate Eyes: PERRLA ENT: Oral Mucosa Clear, moist  Neck: no JVD,  Cardiovascular: S1 and S2 Present, no Murmur,  Respiratory: Equal air entry bilaterally, bilateral crackles and wheezes  Abdomen: BS present, Soft and no tenderness,  Skin: no rashes Extremities: mild Pedal edema, no calf tenderness Neurologic: without any new focal findings Gait not checked due to patient safety concerns  Vitals:   02/15/24 0606 02/15/24 0808 02/15/24 1408 02/15/24 1416  BP: (!) 155/73  (!) 150/75   Pulse: 72  77   Resp: 17  18   Temp: 98.1 F (36.7 C)  97.9 F (36.6 C)   TempSrc: Oral  Oral   SpO2: 100% 99% 100% 100%  Weight:      Height:        Intake/Output Summary (Last 24 hours) at 02/15/2024 1445 Last data filed at 02/15/2024 1400 Gross per 24 hour  Intake 905 ml  Output 3325 ml  Net -2420 ml   Filed Weights   02/10/24 1506  Weight: 89.3 kg    Data Reviewed: I have personally reviewed and interpreted daily labs, tele strips, imagings as discussed above. I reviewed all nursing notes, pharmacy notes, vitals, pertinent old records I have discussed plan of care as described above with RN and patient/family.  CBC: Recent Labs  Lab 02/10/24 0851 02/11/24 0433 02/12/24 0931 02/13/24 0853 02/14/24 1005 02/15/24 0503  WBC 5.0 6.0 6.2 6.3 5.6 2.8*  NEUTROABS 3.7  --   --   --   --   --   HGB 5.5* 8.2* 8.4* 8.3* 8.5* 8.5*  HCT 21.5* 30.1* 30.4* 30.2* 30.7* 30.7*  MCV 101.4* 98.4 99.3 98.1 100.0 97.5  PLT 75* 71* 72* 90* 100* 114*   Basic Metabolic Panel: Recent Labs  Lab 02/10/24 0851 02/11/24 0433 02/12/24 0931 02/14/24 1042 02/15/24 0503  NA  139 136 138 138 137  K 4.0 3.6 3.4* 4.3 3.6  CL 105 105 105 107 104  CO2 23 25 23 26 23   GLUCOSE 97 93 95 89 117*  BUN 29* 25* 20 22 20   CREATININE 1.05* 1.19* 0.85 1.06* 0.94  CALCIUM  8.1* 8.0* 8.2* 8.4* 8.1*  MG  --   --   --  1.8 1.8  PHOS  --   --   --  3.4 3.5    Studies: DG CHEST PORT 1 VIEW Result Date: 02/14/2024 CLINICAL DATA:  Shortness of breath EXAM: PORTABLE CHEST 1 VIEW COMPARISON:  CT 10/20/2023 FINDINGS: Cardiomegaly with central vascular congestion. Small right-sided pleural effusion. Hazy edema or atelectasis at the bases. Aortic atherosclerosis. No pneumothorax. Old appearing bilateral rib fractures IMPRESSION: Cardiomegaly with central vascular congestion and small right pleural effusion. Hazy edema or atelectasis at the bases. Electronically Signed   By: [name] Bun M.D.   On: 02/14/2024 17:12    Scheduled Meds:  acetaminophen   650 mg Oral Q6H  Or   acetaminophen   650 mg Rectal Q6H   allopurinol   300 mg Oral Daily   amiodarone   200 mg Oral Daily   arformoterol   15 mcg Nebulization BID   busPIRone   10 mg Oral BID   cyanocobalamin   1,000 mcg Oral Daily   diltiazem   120 mg Oral Daily   DULoxetine   20 mg Oral Daily   furosemide        ipratropium-albuterol   3 mL Nebulization Q6H   levothyroxine   100 mcg Oral QAC breakfast   methylPREDNISolone  (SOLU-MEDROL ) injection  40 mg Intravenous Q12H   pantoprazole   40 mg Oral BID   potassium chloride  SA  20 mEq Oral QHS   sodium chloride  flush  3 mL Intravenous Q12H   sodium chloride  flush  3 mL Intravenous Q12H   sucralfate   1 g Oral BID   Continuous Infusions: PRN Meds: diphenhydrAMINE , furosemide , HYDROmorphone  (DILAUDID ) injection, ipratropium-albuterol , methocarbamol , ondansetron  **OR** ondansetron  (ZOFRAN ) IV, oxyCODONE , sodium chloride  flush  Time spent: 40 minutes  Author: ELVAN SOR. MD [facility] 02/15/2024 2:45 PM  To reach On-call, see care teams to locate the attending and reach out to them  via [email or web address]. If 7PM-7AM, please contact night-coverage If you still have difficulty reaching the attending provider, please page the [facility] (Director on Call) for [facility] on amion for assistance.

## 2024-02-15 NOTE — Telephone Encounter (Signed)
 Patient referred to infusion pharmacy team for ambulatory infusion of IV iron .  Insurance - [employer] Medicare Site of care - Site of care: [facility] Dx code - K62/D64.9/K92.2  IV Iron  Therapy - Feraheme  510 mg IV x 1. Patient received venofer  300 mg IV x 1 as an inpatient  Infusion appointments - Scheduling team will schedule patient as soon as possible.    Loleta Frommelt D. Keilana Morlock, PharmD

## 2024-02-15 NOTE — Telephone Encounter (Addendum)
 Auth Submission: NO AUTH NEEDED Site of care: Site of care: [facility] Payer: [employer] dual Medication & CPT/J Code(s) submitted: Feraheme  (ferumoxytol ) [id number] Diagnosis Code:  Route of submission (phone, fax, portal):  Phone # Fax # Auth type: Buy/Bill PB Units/visits requested: x1 doses Reference number:  Approval from: 02/15/24 to 05/17/24

## 2024-02-15 NOTE — Progress Notes (Signed)
*  PRELIMINARY RESULTS* Echocardiogram 2D Echocardiogram has been performed.  Benard FORBES Stallion 02/15/2024, 3:47 PM

## 2024-02-15 NOTE — Progress Notes (Signed)
 Pt. Is appears to be breathing better, less audible wheezing. Lasix  is working had 700 cc output. Pt. received 2nd  dose of respiratory treatment. She's resting with, denies short of breath.

## 2024-02-15 NOTE — Plan of Care (Signed)

## 2024-02-15 NOTE — Progress Notes (Signed)
 1 Day Post-Op  Subjective: Patient has been readmitted from home with back pain and anemia. Found to have a new compression fracture and hgb 5. EGD yesterday showed slow oozing from the known duodenal mass. Patient is currently on oxygen due to pulmonary edema.   Objective: Vital signs in last 24 hours: Temp:  [97.5 F (36.4 C)-99.6 F (37.6 C)] 98.1 F (36.7 C) (08/21 0606) Pulse Rate:  [72-85] 72 (08/21 0606) Resp:  [15-20] 17 (08/21 0606) BP: (110-170)/(58-94) 155/73 (08/21 0606) SpO2:  [94 %-100 %] 99 % (08/21 0808) Last BM Date : 02/09/24  Intake/Output from previous day: 08/20 0701 - 08/21 0700 In: 688.6 [I.V.:383.6; IV Piggyback:305] Out: 2900 [Urine:2900] Intake/Output this shift: No intake/output data recorded.  PE: General: resting in bed, appears frail Neuro: alert and oriented, no focal deficits Resp: normal work of breathing on nasal cannula Abdomen: soft, nondistended, nontender to palpation. Extremities: warm and well-perfused   Lab Results:  Recent Labs    02/14/24 1005 02/15/24 0503  WBC 5.6 2.8*  HGB 8.5* 8.5*  HCT 30.7* 30.7*  PLT 100* 114*   BMET Recent Labs    02/14/24 1042 02/15/24 0503  NA 138 137  K 4.3 3.6  CL 107 104  CO2 26 23  GLUCOSE 89 117*  BUN 22 20  CREATININE 1.06* 0.94  CALCIUM  8.4* 8.1*   PT/INR No results for input(s): LABPROT, INR in the last 72 hours. CMP     Component Value Date/Time   NA 137 02/15/2024 0503   NA 142 09/11/2023 1611   NA 142 08/11/2016 0936   K 3.6 02/15/2024 0503   K 3.4 (L) 08/11/2016 0936   CL 104 02/15/2024 0503   CO2 23 02/15/2024 0503   CO2 25 08/11/2016 0936   GLUCOSE 117 (H) 02/15/2024 0503   GLUCOSE 119 08/11/2016 0936   BUN 20 02/15/2024 0503   BUN 22 09/11/2023 1611   BUN 23.3 08/11/2016 0936   CREATININE 0.94 02/15/2024 0503   CREATININE 1.25 (H) 11/17/2023 1434   CREATININE 1.3 (H) 08/11/2016 0936   CALCIUM  8.1 (L) 02/15/2024 0503   CALCIUM  9.6 08/11/2016 0936    PROT 5.7 (L) 02/10/2024 0851   PROT 5.7 (L) 07/18/2023 1542   PROT 6.8 08/11/2016 0936   ALBUMIN 2.4 (L) 02/10/2024 0851   ALBUMIN 3.3 (L) 07/18/2023 1542   ALBUMIN 3.3 (L) 08/11/2016 0936   AST 34 02/10/2024 0851   AST 50 (H) 11/17/2023 1434   AST 16 08/11/2016 0936   ALT 22 02/10/2024 0851   ALT 28 11/17/2023 1434   ALT 18 08/11/2016 0936   ALKPHOS 187 (H) 02/10/2024 0851   ALKPHOS 121 08/11/2016 0936   BILITOT 0.7 02/10/2024 0851   BILITOT 0.3 11/17/2023 1434   BILITOT 0.36 08/11/2016 0936   GFRNONAA >60 02/15/2024 0503   GFRNONAA 44 (L) 11/17/2023 1434   GFRAA 41 (L) 07/26/2018 1232   Lipase     Component Value Date/Time   LIPASE 37 03/22/2017 1530       Studies/Results: DG CHEST PORT 1 VIEW Result Date: 02/14/2024 CLINICAL DATA:  Shortness of breath EXAM: PORTABLE CHEST 1 VIEW COMPARISON:  CT 10/20/2023 FINDINGS: Cardiomegaly with central vascular congestion. Small right-sided pleural effusion. Hazy edema or atelectasis at the bases. Aortic atherosclerosis. No pneumothorax. Old appearing bilateral rib fractures IMPRESSION: Cardiomegaly with central vascular congestion and small right pleural effusion. Hazy edema or atelectasis at the bases. Electronically Signed   By: [name] Scott HERO.D.  On: 02/14/2024 17:12    Anti-infectives: Anti-infectives (From admission, onward)    Start     Dose/Rate Route Frequency Ordered Stop   02/10/24 1000  cephALEXin  (KEFLEX ) capsule 500 mg        500 mg Oral  Once 02/10/24 0950 02/10/24 1014        Assessment/Plan 78 yo female with duodenal adenocarcinoma and multiple medical comorbidities, with recent falls over the last few months. She has been readmitted with symptomatic anemia, presumably due to continued slow oozing from her known duodenal cancer. On discussions with the patient and review of her PT notes, it appears her mobility remains very limited, and she requires significant assistance to get out of bed. She has declined  return to SNF after discharge. It does not seem her performance status has improved since her initial consult with me last month. She also developed shortness of breath yesterday following her EGD, and CXR showed pulmonary edema. She is subjectively feeling better after diuresis.  Given her significant comorbidities, advanced age and overall persistently poor performance status, I think a Whipple would be very debilitating for her and carry significant risk of long-term complications. The patient is also concerned about her ability to recover from a major surgery. I do not feel she is a good surgical candidate at this point, and will cancel her upcoming surgery. She may benefit from radiation given the ongoing bleeding from her primary tumor. I will discuss with her oncologist Dr. Ezzard and will also refer her to radiation oncology. I discussed this with the patient today and she is agreeable to this plan.    LOS: 4 days    Leonor Dawn, MD [facility] General, Hepatobiliary and Pancreatic Surgery 02/15/24 8:30 AM

## 2024-02-15 NOTE — Progress Notes (Signed)
   02/15/24 2122  Assess: MEWS Score  Temp 98.1 F (36.7 C)  BP (!) 146/92  MAP (mmHg) 109  Pulse Rate (!) 125  Resp 20  SpO2 96 %  O2 Device Room Air  Assess: MEWS Score  MEWS Temp 0  MEWS Systolic 0  MEWS Pulse 2  MEWS RR 0  MEWS LOC 0  MEWS Score 2  MEWS Score Color Yellow  Assess: if the MEWS score is Yellow or Red  Were vital signs accurate and taken at a resting state? Yes  Does the patient meet 2 or more of the SIRS criteria? No  MEWS guidelines implemented  Yes, yellow  Treat  MEWS Interventions Considered administering scheduled or prn medications/treatments as ordered  Take Vital Signs  Increase Vital Sign Frequency  Yellow: Q2hr x1, continue Q4hrs until patient remains green for 12hrs  Escalate  MEWS: Escalate Yellow: Discuss with charge nurse and consider notifying provider and/or RRT  Notify: Charge Nurse/RN  Name of Charge Nurse/RN Notified Courtney,RN  Provider Notification  Provider Name/Title A. Chavez,NP  Date Provider Notified 02/15/24  Time Provider Notified 2126  Method of Notification Page  Notification Reason Other (Comment) (yellow mews, tachy 125)  Provider response See new orders  Date of Provider Response 02/15/24  Time of Provider Response 2126  Assess: SIRS CRITERIA  SIRS Temperature  0  SIRS Respirations  0  SIRS Pulse 1  SIRS WBC 0  SIRS Score Sum  1

## 2024-02-15 NOTE — TOC Progression Note (Addendum)
 Transition of Care [facility]) - Progression Note    Patient Details  Name: Peggy Armstrong MRN: [id number] Date of Birth: Oct 31, 1945  [facility] of [facility]) CM/SW Contact  [name], RN Phone Number: 02/15/2024, 2:09 PM  Clinical Narrative: Radiologist oncology referral pending, Palliative Medicine Team consult,  not medically ready for dc. TOC will continue to follow.       Expected Discharge Plan: Home w Home Health Services Barriers to Discharge: Continued Medical Work up               Expected Discharge Plan and Services       Living arrangements for the past 2 months: Apartment                                       Social Drivers of Health (SDOH) Interventions SDOH Screenings   Food Insecurity: No Food Insecurity (02/10/2024)  Housing: Low Risk  (02/10/2024)  Transportation Needs: No Transportation Needs (02/10/2024)  Utilities: Not At Risk (02/10/2024)  Depression (PHQ2-9): Low Risk  (12/22/2023)  Financial Resource Strain: Low Risk  (12/02/2022)   Received from [facility]  Physical Activity: Unknown (04/05/2022)   Received from [facility]  Social Connections: Moderately Isolated (02/10/2024)  Stress: No Stress Concern Present (04/05/2022)   Received from [facility]  Tobacco Use: Medium Risk (02/14/2024)    Readmission Risk Interventions    02/12/2024    2:43 PM 07/24/2023    5:23 PM 05/14/2023    6:44 PM  Readmission Risk Prevention Plan  Transportation Screening Complete Complete Complete  PCP or Specialist Appt within 3-5 Days Complete Complete Complete  HRI or Home Care Consult Complete Complete Complete  Social Work Consult for Recovery Care Planning/Counseling  Complete Complete  Palliative Care Screening Not Applicable Not Applicable Complete  Medication Review Oceanographer) Complete Referral to Pharmacy Referral to Pharmacy

## 2024-02-15 NOTE — Consult Note (Signed)
 Consultation Note Date: 02/15/2024   Patient Name: Peggy Armstrong  DOB: 1946-05-27  MRN: [id number]  Age / Sex: 78 y.o., female  PCP: Rothfuss, [name] DASEN, PA-C Referring Physician: Von Bellis, MD  Reason for Consultation: Establishing goals of care  HPI/Patient Profile: 78 y.o. female  admitted on 02/10/2024    Clinical Assessment and Goals of Care: 78 year old female who follows with oncology in [place], [place]  with a life limiting illness of duodenal adenocarcinoma and multiple medical comorbidities, recent falls, recent gradual progressive functional decline admitted to the hospital for back pain and anemia, found to have new compression fracture and hemoglobin of 5, EGD on 8-20 showing oozing from known duodenal mass Patient has been seen and evaluated by gastroenterology as well as by general surgery.  Given significant comorbidities advanced age and poor performance status, a Whipple procedure is deemed to be high risk and to be very debilitating for her.   A palliative consult has been requested for ongoing goals of care discussions. Patient is awake alert resting in bed. Patient is aware of the serious nature of her current condition.  She recalls having undergone EGD and she recalls having had conversations with surgery service.  She states that she has decided not to undergo surgery.  She is aware that radiation oncology is to be consulted. At present, she denies any pain or discomfort.  She recalls having had conversations with her oncologist and understands that she is likely not a candidate for chemotherapy or radiation.  She is hoping that she will qualify for a chemotherapy/immunotherapy pill.  She states that her daughter also has cancer and is on chemotherapy.  She states that she has some experience with skilled nursing facility rehabilitation attempt but does not ever want to go back to  a SNF.  She is accepting of home-based physical therapy. CODE STATUS discussions undertaken.  Full code versus DNR/DNI explained patient states that she still has a purpose to fulfill and that  God is not done with her desires continuation of full code full CODE STATUS for now. Briefly explained about role of palliative services.  Briefly gave her information about community-based palliative support and she is in agreement. NEXT OF KIN Daughter, she states she has 2 daughters.  SUMMARY OF RECOMMENDATIONS   Full code full scope for now.  Extensive discussions undertaken today at the time of initial palliative consultation Continue current pain and non-- pain symptom management regimen Recommend home with PT as per patient's request, recommend community-based palliative support Thank you for the consult.  Code Status/Advance Care Planning: Full code   Symptom Management:     Palliative Prophylaxis:  Delirium Protocol  Additional Recommendations (Limitations, Scope, Preferences): Full Scope Treatment  Psycho-social/Spiritual:  Desire for further Chaplaincy support:yes Additional Recommendations: Caregiving  Support/Resources  Prognosis:  < 12 months  Discharge Planning: Home with Palliative Services      Primary Diagnoses: Present on Admission:  Severe anemia  Duodenal adenocarcinoma (HCC)  Acquired hypothyroidism  Atypical atrial flutter (HCC)  Chronic  diastolic CHF (congestive heart failure) (HCC)  CKD (chronic kidney disease) stage 3, GFR 30-59 ml/min (HCC)   I have reviewed the medical record, interviewed the patient and family, and examined the patient. The following aspects are pertinent.  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   Cellulitis of left arm 08/14/2023   Cholangitis    Choledocholithiasis 03/22/2017   Chronic diastolic CHF (congestive heart failure) (HCC) 11/08/2019   Chronic low back pain 07/18/2023   CKD (chronic kidney disease) stage 3, GFR 30-59 ml/min (HCC) 06/09/2017   CKD (chronic kidney disease), stage III (HCC) 06/09/2017   Closed compression fracture of L2 lumbar vertebra, initial encounter (HCC) 05/11/2023   Clotting disorder (HCC)    right DVT     Dehydration 05/12/2023   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   Duodenal mass    Duodenal ulcer    DVT (deep venous thrombosis) (HCC)    right DVT   Epigastric pain    Essential hypertension    Fall at home, initial encounter 10/21/2023   GAD (generalized anxiety disorder)    Gastric polyps 05/16/2023   Generalized weakness 05/12/2023   GI bleed 07/20/2023   Gout    Gout    Hematemesis 11/05/2017   Hemorrhagic shock (HCC) 07/21/2023   History of depression 10/21/2023   History of DVT (deep vein thrombosis) 11/08/2019   History of GI bleed 10/21/2023   Hyperlipidemia 05/12/2023   Hypertension    Hypokalemia 07/03/2017   Hypothyroidism 05/12/2023   Iron  deficiency anemia 08/02/2016   Left elbow avulsion fracture 10/21/2023   Left humeral fracture 10/21/2023   Long term current use of anticoagulant therapy 06/20/2023   Macrocytic anemia 11/05/2017   Melena 11/05/2017   Morbid obesity (HCC) 11/08/2019   Nausea & vomiting    Need for immunization against influenza 07/18/2019   Need for vaccination 04/23/2018   Occult blood in stools 05/16/2023   Paroxysmal atrial fibrillation (HCC) 05/12/2023   Peri-ampullary neoplasm    Poor dentition 08/14/2016   RA (rheumatoid arthritis) (HCC)    Restless leg 12/02/2022   Right shoulder pain 07/26/2018   Routine general medical  examination at a health care facility 12/13/2019   S/P ERCP 05/05/2017   SIRS (systemic inflammatory response syndrome) (HCC) 03/22/2017   Upper GI bleed 06/20/2023   Social History   Socioeconomic History   Marital status: Divorced    Spouse name: Not on file   Number of children: 2   Years of education: 11   Highest education level: Not on file  Occupational History   Occupation: RETIRED - FACTORY WORKER  Tobacco Use   Smoking status: Former    Types: Cigarettes    Passive exposure: Past   Smokeless tobacco: Former   Tobacco comments:    Quit in 2012. Started at 20's. Cannot recall how many.   Vaping Use   Vaping status: Never Used  Substance and Sexual Activity   Alcohol use: No   Drug use: No   Sexual activity: Yes    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  Other Topics  Concern   Not on file  Social History Narrative   Not on file   Social Drivers of Health   Financial Resource Strain: Low Risk  (12/02/2022)   [facility]   Overall Financial Resource Strain (CARDIA)    Difficulty of Paying Living Expenses: Not very hard  Food Insecurity: No Food Insecurity (02/10/2024)   Hunger Vital Sign    Worried About Running Out of Food in the Last Year: Never true    Ran Out of Food in the Last Year: Never true  Transportation Needs: No Transportation Needs (02/10/2024)   PRAPARE - Administrator, Civil Service (Medical): No    Lack of Transportation (Non-Medical): No  Physical Activity: Unknown (04/05/2022)   Received from [facility]   Exercise Vital Sign    On average, how many days per week do you engage in moderate to strenuous exercise (like a brisk walk)?: 0 days    Minutes of Exercise per Session: Not on file  Stress: No Stress Concern Present (04/05/2022)   Received from [facility] - Occupational Stress Questionnaire    Feeling of Stress : Not at all  Social Connections: Moderately Isolated  (02/10/2024)   Social Connection and Isolation Panel    Frequency of Communication with Friends and Family: Twice a week    Frequency of Social Gatherings with Friends and Family: Twice a week    Attends Religious Services: 1 to 4 times per year    Active Member of [employer] or Organizations: No    Attends Engineer, structural: Never    Marital Status: Divorced   Family History  Problem Relation Age of Onset   Hypertension Mother    Prostate cancer Father    Colon cancer Father    Hypertension Brother    Rheum arthritis Maternal Grandmother    Diabetes Paternal Grandmother    Heart disease Paternal Grandfather    Colon cancer Child    Scheduled Meds:  acetaminophen   650 mg Oral Q6H   Or   acetaminophen   650 mg Rectal Q6H   allopurinol   300 mg Oral Daily   amiodarone   200 mg Oral Daily   arformoterol   15 mcg Nebulization BID   busPIRone   10 mg Oral BID   cyanocobalamin   1,000 mcg Oral Daily   diltiazem   120 mg Oral Daily   DULoxetine   20 mg Oral Daily   furosemide        ipratropium-albuterol   3 mL Nebulization Q6H   levothyroxine   100 mcg Oral QAC breakfast   methylPREDNISolone  (SOLU-MEDROL ) injection  40 mg Intravenous Q12H   pantoprazole   40 mg Oral BID   potassium chloride  SA  20 mEq Oral QHS   sodium chloride  flush  3 mL Intravenous Q12H   sodium chloride  flush  3 mL Intravenous Q12H   sucralfate   1 g Oral BID   Continuous Infusions: PRN Meds:.diphenhydrAMINE , furosemide , HYDROmorphone  (DILAUDID ) injection, ipratropium-albuterol , methocarbamol , ondansetron  **OR** ondansetron  (ZOFRAN ) IV, oxyCODONE , sodium chloride  flush Medications Prior to Admission:  Prior to Admission medications   Medication Sig Start Date End Date Taking? Authorizing Provider  acetaminophen  (TYLENOL ) 325 MG tablet Take 650 mg by mouth every 6 (six) hours as needed for mild pain (pain score 1-3) or moderate pain (pain score 4-6).   Yes [provider]  albuterol  (VENTOLIN  HFA) 108  (90 Base) MCG/ACT inhaler Inhale 1-2 puffs into the lungs every 6 (six) hours as needed for  wheezing or shortness of breath.   Yes [provider]  allopurinol  (ZYLOPRIM ) 300 MG tablet Take 1 tablet (300 mg total) by mouth daily. 07/18/23  Yes Rothfuss, Jacob T, PA-C  amiodarone  (PACERONE ) 200 MG tablet Take 1 tablet (200 mg total) by mouth daily. 09/01/23  Yes Croitoru, Mihai, MD  Ascorbic Acid (VITAMIN C PO) Take 1 tablet by mouth in the morning.   Yes [provider]  atorvastatin  (LIPITOR) 20 MG tablet Take 1 tablet (20 mg total) by mouth daily. 12/19/22  Yes Vannie Reche RAMAN, NP  busPIRone  (BUSPAR ) 10 MG tablet TAKE 1 TABLET BY MOUTH TWICE A DAY 08/22/23  Yes Rothfuss, Jacob T, PA-C  Cholecalciferol  (VITAMIN D3 PO) Take 1 capsule by mouth in the morning.   Yes [provider]  cyanocobalamin  (VITAMIN B12) 1000 MCG tablet Take 1,000 mcg by mouth daily. Patient taking differently: Take 500-1,000 mcg by mouth daily.   Yes [provider]  diclofenac (VOLTAREN) 50 MG EC tablet Take 50 mg by mouth 2 (two) times daily with a meal.   Yes [provider]  diltiazem  (CARDIZEM  CD) 120 MG 24 hr capsule Take 120 mg by mouth daily.   Yes [provider]  DULoxetine  (CYMBALTA ) 20 MG capsule TAKE 1 CAPSULE BY MOUTH EVERY DAY 10/23/23  Yes Rothfuss, Jacob T, PA-C  ELIQUIS  2.5 MG TABS tablet Take 2.5 mg by mouth in the morning and at bedtime.   Yes [provider]  ferrous sulfate  325 (65 FE) MG tablet Take 325 mg by mouth daily with breakfast.   Yes [provider]  ipratropium-albuterol  (DUONEB) 0.5-2.5 (3) MG/3ML SOLN Take 3 mLs by nebulization every 6 (six) hours as needed (for shortness of breath or wheezing).   Yes [provider]  KLOR-CON  M20 20 MEQ tablet Take 20 mEq by mouth in the morning and at bedtime.   Yes [provider]  levothyroxine  (SYNTHROID ) 100 MCG tablet Take 1 tablet (100 mcg total) by mouth daily before  breakfast. 07/18/23  Yes Rothfuss, Jacob T, PA-C  melatonin 3 MG TABS tablet Take 1 tablet (3 mg total) by mouth at bedtime as needed (insomnia). Patient taking differently: Take 3 mg by mouth at bedtime. 05/18/23  Yes Verdene Purchase, MD  methocarbamol  (ROBAXIN ) 500 MG tablet TAKE 1 TABLET BY MOUTH EVERY 6 HOURS AS NEEDED FOR MUSCLE SPASMS. 10/03/23  Yes Rothfuss, Jacob T, PA-C  ondansetron  (ZOFRAN ) 4 MG tablet Take 4 mg by mouth every 6 (six) hours as needed for nausea or vomiting.   Yes [provider]  pantoprazole  (PROTONIX ) 40 MG tablet Take 1 tablet (40 mg total) by mouth daily. Patient taking differently: Take 40 mg by mouth daily before breakfast. 07/18/23  Yes Rothfuss, Jacob T, PA-C  sertraline  (ZOLOFT ) 25 MG tablet Take 25 mg by mouth daily.   Yes [provider]  torsemide  (DEMADEX ) 20 MG tablet Take Torsemide  on alternating days40 mg one day (2 tablets) then the next day 20 mg (1 tablet). Patient taking differently: Take 20 mg by mouth in the morning. 09/21/23  Yes Madireddy, Alean SAUNDERS, MD  traMADol  (ULTRAM ) 50 MG tablet Take 1 tablet (50 mg total) by mouth every 8 (eight) hours as needed. Patient taking differently: Take 50 mg by mouth every 8 (eight) hours as needed (for pain). 02/01/24  Yes Rothfuss, Jacob T, PA-C  Calcium  Carb-Cholecalciferol  (CALCIUM  600 + D PO) Take 1 tablet by mouth in the morning.    [provider]  ELIQUIS  5 MG TABS tablet Take 1 tablet (5 mg total) by mouth 2 (two) times daily. Patient not taking: Reported on 02/10/2024 10/29/23   Vernon Ranks, MD  folic acid  (FOLVITE ) 1 MG tablet Take 1 tablet (1 mg total) by mouth daily. Patient not taking: Reported on 02/10/2024 05/18/23   Verdene Purchase, MD   Allergies  Allergen Reactions   Codeine Nausea And Vomiting and Other (See Comments)    Made me very sick   Tape Other (See Comments)    Tears skin, as it is VERY THIN!!  - surgical tape   Review of Systems + weakness   Physical  Exam Awake alert Appears deconditioned and with generalized weakness Regular work of breathing  Vital Signs: BP (!) 150/75 (BP Location: Right Arm)   Pulse 77   Temp 97.9 F (36.6 C) (Oral)   Resp 18   Ht 5' (1.524 m)   Wt 89.3 kg   SpO2 100%   BMI 38.45 kg/m  Pain Scale: 0-10   Pain Score: 4    SpO2: SpO2: 100 % O2 Device:SpO2: 100 % O2 Flow Rate: .O2 Flow Rate (L/min): 2 L/min  IO: Intake/output summary:  Intake/Output Summary (Last 24 hours) at 02/15/2024 1457 Last data filed at 02/15/2024 1400 Gross per 24 hour  Intake 905 ml  Output 3325 ml  Net -2420 ml    LBM: Last BM Date : 02/09/24 Baseline Weight: Weight: 89.3 kg Most recent weight: Weight: 89.3 kg     Palliative Assessment/Data:   PPS 60%  Time In:  12 Time Out:  1300 Time Total:  60 Greater than 50%  of this time was spent counseling and coordinating care related to the above assessment and plan.  Signed by: Lonia Serve, MD   Please contact Palliative Medicine Team phone at [phone] for questions and concerns.  For individual provider: See Tracey

## 2024-02-16 ENCOUNTER — Ambulatory Visit: Payer: Self-pay | Admitting: Gastroenterology

## 2024-02-16 DIAGNOSIS — D649 Anemia, unspecified: Secondary | ICD-10-CM | POA: Diagnosis not present

## 2024-02-16 LAB — BASIC METABOLIC PANEL WITH GFR
Anion gap: 8 (ref 5–15)
BUN: 28 mg/dL — ABNORMAL HIGH (ref 8–23)
CO2: 28 mmol/L (ref 22–32)
Calcium: 8.3 mg/dL — ABNORMAL LOW (ref 8.9–10.3)
Chloride: 103 mmol/L (ref 98–111)
Creatinine, Ser: 0.98 mg/dL (ref 0.44–1.00)
GFR, Estimated: 59 mL/min — ABNORMAL LOW (ref >60)
Glucose, Bld: 120 mg/dL — ABNORMAL HIGH (ref 70–99)
Potassium: 3.1 mmol/L — ABNORMAL LOW (ref 3.5–5.1)
Sodium: 139 mmol/L (ref 135–145)

## 2024-02-16 LAB — PHOSPHORUS: Phosphorus: 2.7 mg/dL (ref 2.5–4.6)

## 2024-02-16 LAB — CBC
HCT: 31.7 % — ABNORMAL LOW (ref 36.0–46.0)
Hemoglobin: 8.7 g/dL — ABNORMAL LOW (ref 12.0–15.0)
MCH: 26.4 pg (ref 26.0–34.0)
MCHC: 27.4 g/dL — ABNORMAL LOW (ref 30.0–36.0)
MCV: 96.4 fL (ref 80.0–100.0)
Platelets: 172 K/uL (ref 150–400)
RBC: 3.29 MIL/uL — ABNORMAL LOW (ref 3.87–5.11)
RDW: 17.3 % — ABNORMAL HIGH (ref 11.5–15.5)
WBC: 4.4 K/uL (ref 4.0–10.5)
nRBC: 0.5 % — ABNORMAL HIGH (ref 0.0–0.2)

## 2024-02-16 LAB — MAGNESIUM: Magnesium: 1.8 mg/dL (ref 1.7–2.4)

## 2024-02-16 LAB — CYTOLOGY - NON PAP

## 2024-02-16 MED ORDER — FUROSEMIDE 10 MG/ML IJ SOLN
40.0000 mg | Freq: Once | INTRAMUSCULAR | Status: AC
  Administered 2024-02-16: 40 mg via INTRAVENOUS
  Filled 2024-02-16: qty 4

## 2024-02-16 MED ORDER — POTASSIUM CHLORIDE CRYS ER 20 MEQ PO TBCR
40.0000 meq | EXTENDED_RELEASE_TABLET | ORAL | Status: AC
Start: 2024-02-16 — End: 2024-02-16
  Administered 2024-02-16 (×2): 40 meq via ORAL
  Filled 2024-02-16 (×2): qty 2

## 2024-02-16 MED ORDER — POTASSIUM CHLORIDE CRYS ER 20 MEQ PO TBCR
20.0000 meq | EXTENDED_RELEASE_TABLET | Freq: Every day | ORAL | Status: DC
  Administered 2024-02-17 – 2024-02-24 (×8): 20 meq via ORAL
  Filled 2024-02-16 (×9): qty 1

## 2024-02-16 MED ORDER — METOPROLOL TARTRATE 5 MG/5ML IV SOLN
5.0000 mg | Freq: Four times a day (QID) | INTRAVENOUS | Status: AC | PRN
  Administered 2024-02-16 – 2024-02-17 (×2): 5 mg via INTRAVENOUS
  Filled 2024-02-16 (×2): qty 5

## 2024-02-16 MED ORDER — IPRATROPIUM-ALBUTEROL 0.5-2.5 (3) MG/3ML IN SOLN
3.0000 mL | Freq: Two times a day (BID) | RESPIRATORY_TRACT | Status: DC
  Administered 2024-02-16 – 2024-02-17 (×3): 3 mL via RESPIRATORY_TRACT
  Filled 2024-02-16 (×4): qty 3

## 2024-02-16 MED ORDER — MELATONIN 5 MG PO TABS
5.0000 mg | ORAL_TABLET | Freq: Every evening | ORAL | Status: AC | PRN
  Administered 2024-02-16 – 2024-02-18 (×3): 5 mg via ORAL
  Filled 2024-02-16 (×3): qty 1

## 2024-02-16 NOTE — Progress Notes (Signed)
 Daily Progress Note   Patient Name: Peggy Armstrong       Date: 02/16/2024 DOB: [birth year]  Age: 78 y.o. MRN#: [id number] Attending Physician: Von Bellis, MD Primary Care Physician: Iven [name] [name] Admit Date: 02/10/2024  Reason for Consultation/Follow-up: Establishing goals of care  Subjective: Awake alert In good spirits  Length of Stay: 5  Current Medications: Scheduled Meds:   acetaminophen   650 mg Oral Q6H   Or   acetaminophen   650 mg Rectal Q6H   allopurinol   300 mg Oral Daily   amiodarone   200 mg Oral Daily   arformoterol   15 mcg Nebulization BID   busPIRone   10 mg Oral BID   cyanocobalamin   1,000 mcg Oral Daily   diltiazem   120 mg Oral Daily   DULoxetine   20 mg Oral Daily   ipratropium-albuterol   3 mL Nebulization BID   levothyroxine   100 mcg Oral QAC breakfast   methylPREDNISolone  (SOLU-MEDROL ) injection  81.25 mg Intravenous Daily   pantoprazole   40 mg Oral BID   potassium chloride  SA  20 mEq Oral QHS   sodium chloride  flush  3 mL Intravenous Q12H   sodium chloride  flush  3 mL Intravenous Q12H   sucralfate   1 g Oral BID    Continuous Infusions:   PRN Meds: diphenhydrAMINE , HYDROmorphone  (DILAUDID ) injection, ipratropium-albuterol , melatonin, methocarbamol , metoprolol  tartrate, ondansetron  **OR** ondansetron  (ZOFRAN ) IV, oxyCODONE , sodium chloride  flush  Physical Exam         Awake alert No distress currently Appears with generalized weakness  Vital Signs: BP (!) 143/107 (BP Location: Right Arm)   Pulse (!) 121   Temp 98.6 F (37 C) (Oral)   Resp 19   Ht 5' (1.524 m)   Wt 89.3 kg   SpO2 98%   BMI 38.45 kg/m  SpO2: SpO2: 98 % O2 Device: O2 Device: Room Air O2 Flow Rate: O2 Flow Rate (L/min): 2 L/min  Intake/output summary:   Intake/Output Summary (Last 24 hours) at 02/16/2024 1118 Last data filed at 02/16/2024 0930 Gross per 24 hour  Intake 1440 ml  Output 2500 ml  Net -1060 ml   LBM: Last BM Date : 02/15/24 Baseline Weight: Weight: 89.3 kg Most recent weight: Weight: 89.3 kg       Palliative Assessment/Data:      Patient Active Problem List  Diagnosis Date Noted   Anemia due to blood loss, acute 02/15/2024   IDA (iron  deficiency anemia) 02/15/2024   Anemia 02/14/2024   Mucosal abnormality of esophagus 02/14/2024   Chronic anticoagulation 02/12/2024   Acute on chronic anemia 02/12/2024   Severe anemia 02/10/2024   Closed compression fracture of body of L1 vertebra (HCC) 02/10/2024   Lumbar radiculopathy 12/15/2023   Acquired hypothyroidism    Fall at home, initial encounter 10/21/2023   Left humeral fracture 10/21/2023   Left elbow avulsion fracture 10/21/2023   History of GI bleed 10/21/2023   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   History of depression 10/21/2023   Atrial fibrillation (HCC)    Back injury    DVT (deep venous thrombosis) (HCC)    Hypertension    Cellulitis of left arm 08/14/2023   Hemorrhagic shock (HCC) 07/21/2023   ABLA (acute blood loss anemia) 07/21/2023   Acute GI bleeding 07/21/2023   AKI (acute kidney injury) (HCC) 07/21/2023   GI bleed 07/20/2023   Chronic low back pain 07/18/2023   Upper GI bleed 06/20/2023   Long term current use of anticoagulant therapy 06/20/2023   Occult blood in stools 05/16/2023   Gastric polyps 05/16/2023   Generalized weakness 05/12/2023   Acute cystitis 05/12/2023   Dehydration 05/12/2023   Acute prerenal azotemia 05/12/2023   Paroxysmal atrial fibrillation (HCC) 05/12/2023   AAA (abdominal aortic aneurysm) (HCC) 05/12/2023   Hyperlipidemia 05/12/2023   GAD (generalized anxiety disorder) 05/12/2023   Hypothyroidism 05/12/2023   Closed compression fracture of L2 lumbar vertebra, initial encounter (HCC) 05/11/2023    Restless leg 12/02/2022   Routine general medical examination at a health care facility 12/13/2019   Chronic diastolic CHF (congestive heart failure) (HCC) 11/08/2019   Morbid obesity (HCC) 11/08/2019   History of DVT (deep vein thrombosis) 11/08/2019   Need for immunization against influenza 07/18/2019   Right shoulder pain 07/26/2018   Need for vaccination 04/23/2018   Duodenal ulcer    Hematemesis 11/05/2017   Macrocytic anemia 11/05/2017   Melena 11/05/2017   Nausea & vomiting    Hypokalemia 07/03/2017   Essential hypertension    Gout    Clotting disorder (HCC)    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   CKD (chronic kidney disease) stage 3, GFR 30-59 ml/min (HCC) 06/09/2017   CKD (chronic kidney disease), stage III (HCC) 06/09/2017   S/P ERCP 05/05/2017   Peri-ampullary neoplasm    Atypical atrial flutter (HCC)    Duodenal mass    Cholangitis    Epigastric pain    Choledocholithiasis 03/22/2017   SIRS (systemic inflammatory response syndrome) (HCC) 03/22/2017   Poor dentition 08/14/2016   Iron  deficiency anemia 08/02/2016   RA (rheumatoid arthritis) (HCC) 09/07/2015    Palliative Care Assessment & Plan   Patient Profile:    Assessment:  78 year old woman complex PMH including inoperable duodenal adenocarcinoma, rheumatoid arthritis, CKD, multiple spontaneous vertebral compression fractures, who presented to the Emergency Department for new back pain. Evaluation was notable for profound anemia and CT showed acute L1 compression fracture. She was admitted for transfusion, pain control.  8/20 s/p EGD:  White nummular lesions in esophageal mucosa.                            Brushings performed to rule out Candida. - Multiple inflammatoryappearing gastric polyps.  - Malignant duodenal mass with mild slow oozing taking over the entirety of the duodenum 2. PuraStat  injected.  Recommendations/Plan:  Discussed with TOC, patient wishes for full code full scope care for  now, wants to go back to her oncologist in [place] and discuss further GOC, does not want SNF rehab, wants home PT, recommend community based palliative care for additional support.   Goals of Care and Additional Recommendations: Limitations on Scope of Treatment: Full Scope Treatment  Code Status:    Code Status Orders  (From admission, onward)           Start     Ordered   02/10/24 1612  Full code  Continuous       Question:  By:  Answer:  Consent: discussion documented in EHR   02/10/24 1612           Code Status History     Date Active Date Inactive Code Status Order ID Comments User Context   10/21/2023 2353 10/25/2023 1845 Full Code [id number]  Lee Kingfisher, MD ED   07/20/2023 1733 07/27/2023 1930 Full Code [id number]  Cheryle Page, MD ED   06/20/2023 0756 06/21/2023 1724 Full Code [id number]  Zella Katha HERO, MD ED   05/11/2023 2325 05/18/2023 2000 Full Code [id number]  Howerter, Eva NOVAK, DO ED   11/05/2017 0127 11/09/2017 2147 Full Code [id number]  [name] Agent, MD ED   03/22/2017 2340 03/30/2017 2110 Full Code [id number]  Franky Redia SAILOR, MD ED       Prognosis:  Unable to determine  Discharge Planning: Home with Palliative Services  Care plan was discussed with  IDT  Thank you for allowing the Palliative Medicine Team to assist in the care of this patient.  Low MDM     Greater than 50%  of this time was spent counseling and coordinating care related to the above assessment and plan.  Lonia Serve, MD  Please contact Palliative Medicine Team phone at [phone] for questions and concerns.

## 2024-02-16 NOTE — Progress Notes (Signed)
 Physical Therapy Treatment Patient Details Name: Peggy Armstrong MRN: [id number] DOB: July 29, 1945 Today's Date: 02/16/2024   History of Present Illness 78 y.o. female who presented to the emergency department yesterday with profound fatigue and back pain, found to have hemoglobin 5.5 and positive fecal occult blood. CT= Acute L1 compression fracture.  2. Mild L3 compression fracture with progressive height loss since  10/20/2023.  3. 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 GI consulted. extensive medical history to include congestive heart failure, A-fib on Eliquis , AAA, HTN, chronic back pain, history of DVT, CKD and ampullary adenocarcinoma, L humerus fx, RA, L elbow avulsion fx, LLE fx. 8/20 s/p EGD: Oozing duodenal malignant mass.    PT Comments   Pt admitted with above diagnosis.  Pt currently with functional limitations due to the deficits listed below (see PT Problem List). Pt in bed when PT arrived. Pt agreeable to therapy intervention. Pt indicates no pain. Pt is eager to mobilize. Pt required mod A x 2 for supine to sit, A to don TLSO pt is able to pull to tighten. Pt required min A for sit to stand  from EOB, gait tasks in hallway with RW, min A and progressing to CGA, with RW and recliner close for 75 and 70  feet with seated therapeutic rest break between amb bouts. Pt left seated in recliner, all needs in place. Pt is motivated tor return to personal home vs SNF for rehab, however pt continues to require A for functional mobility tasks and will benefit from continued inpatient follow up therapy, <3 hours/day. Pt will benefit from acute skilled PT to increase their independence and safety with mobility to allow discharge.      If plan is discharge home, recommend the following: Two people to help with walking and/or transfers;Two people to help with bathing/dressing/bathroom;Help with stairs or ramp for entrance;Assist for transportation;Assistance  with cooking/housework   Can travel by private vehicle     Yes  Equipment Recommendations  None recommended by PT    Recommendations for Other Services       Precautions / Restrictions Precautions Precautions: Fall Restrictions Weight Bearing Restrictions Per Provider Order: No     Mobility  Bed Mobility Overal bed mobility: Needs Assistance Bed Mobility: Supine to Sit Rolling: Mod assist, +2 for physical assistance, +2 for safety/equipment         General bed mobility comments: cues to log roll min cues and assist with bed pad, pt able to scoot anteriorly toward EOB once in sitting with CGA and min cues    Transfers Overall transfer level: Needs assistance Equipment used: Rolling walker (2 wheels) Transfers: Bed to chair/wheelchair/BSC Sit to Stand: Min assist           General transfer comment: TLSO donned seated EOB, min cues and pull to stand to RW    Ambulation/Gait Ambulation/Gait assistance: Contact guard assist Gait Distance (Feet): 75 Feet Assistive device: Rolling walker (2 wheels) Gait Pattern/deviations: Step-through pattern, Decreased stance time - right, Trunk flexed Gait velocity: variable     General Gait Details: pt initally amb with short choppy steps, noted leg length discepancy with pt endorsing L > R slight trunk flexion with TLSO donned, min cues for safety, RW management and pursed lip breathing   Stairs             Wheelchair Mobility     Tilt Bed    Modified Rankin (Stroke Patients Only)  Balance Overall balance assessment: Needs assistance, History of Falls Sitting-balance support: Feet unsupported, No upper extremity supported Sitting balance-Leahy Scale: Fair     Standing balance support: During functional activity, Reliant on assistive device for balance Standing balance-Leahy Scale: Poor                              Communication Communication Communication: No apparent difficulties   Cognition Arousal: Alert Behavior During Therapy: WFL for tasks assessed/performed   PT - Cognitive impairments: No apparent impairments                         Following commands: Intact      Cueing Cueing Techniques: Verbal cues  Exercises      General Comments General comments (skin integrity, edema, etc.): pt motivated to ambulate today      Pertinent Vitals/Pain Pain Assessment Pain Assessment: No/denies pain (no reports of back pain pt stated the TLSO helps and supports her back)    Home Living                          Prior Function            PT Goals (current goals can now be found in the care plan section) Acute Rehab PT Goals Patient Stated Goal: home, family feels pt will need rehab PT Goal Formulation: With patient Time For Goal Achievement: 02/25/24 Potential to Achieve Goals: Fair Progress towards PT goals: Progressing toward goals    Frequency    Min 3X/week      PT Plan      Co-evaluation              AM-PAC PT 6 Clicks Mobility   Outcome Measure  Help needed turning from your back to your side while in a flat bed without using bedrails?: A Little Help needed moving from lying on your back to sitting on the side of a flat bed without using bedrails?: A Little Help needed moving to and from a bed to a chair (including a wheelchair)?: A Little Help needed standing up from a chair using your arms (e.g., wheelchair or bedside chair)?: A Little Help needed to walk in hospital room?: A Little Help needed climbing 3-5 steps with a railing? : Total 6 Click Score: 16    End of Session Equipment Utilized During Treatment: Gait belt Activity Tolerance: Patient tolerated treatment well Patient left: in chair;with call bell/phone within reach;with nursing/sitter in room Nurse Communication: Mobility status PT Visit Diagnosis: Other abnormalities of gait and mobility (R26.89)     Time: 8883-8862 PT Time Calculation  (min) (ACUTE ONLY): 21 min  Charges:    $Gait Training: 8-22 mins PT General Charges $$ ACUTE PT VISIT: 1 Visit                     Peggy, PT Acute Rehab    Peggy Armstrong 02/16/2024, 4:08 PM

## 2024-02-16 NOTE — Plan of Care (Signed)

## 2024-02-16 NOTE — TOC Progression Note (Signed)
 Transition of Care [facility]) - Progression Note    Patient Details  Name: Peggy Armstrong MRN: [id number] Date of Birth: Dec 30, 1945  [facility] CM/SW Contact  [name], RN Phone Number: 02/16/2024, 12:37 PM  Clinical Narrative:   Patient seen by Palliative Care Team, recommendation for home Palliative Care services. NCM met with attending on unit, request NCM to speak with patient about home palliative resources, Met with patient at bedside, reviewed Palliative Medicine Team recommendation for home palliative services, patient open to services, list of palliative providers left at bedside for patient to review. TOC will continue to follow.     Expected Discharge Plan: Home w Home Health Services Barriers to Discharge: Continued Medical Work up               Expected Discharge Plan and Services       Living arrangements for the past 2 months: Apartment                                       Social Drivers of Health (SDOH) Interventions SDOH Screenings   Food Insecurity: No Food Insecurity (02/10/2024)  Housing: Low Risk  (02/10/2024)  Transportation Needs: No Transportation Needs (02/10/2024)  Utilities: Not At Risk (02/10/2024)  Depression (PHQ2-9): Low Risk  (12/22/2023)  Financial Resource Strain: Low Risk  (12/02/2022)   Received from [facility]  Physical Activity: Unknown (04/05/2022)   Received from [facility]  Social Connections: Moderately Isolated (02/10/2024)  Stress: No Stress Concern Present (04/05/2022)   Received from [facility]  Tobacco Use: Medium Risk (02/14/2024)    Readmission Risk Interventions    02/12/2024    2:43 PM 07/24/2023    5:23 PM 05/14/2023    6:44 PM  Readmission Risk Prevention Plan  Transportation Screening Complete Complete Complete  PCP or Specialist Appt within 3-5 Days Complete Complete Complete  HRI or Home Care Consult Complete Complete Complete  Social Work Consult for Recovery Care  Planning/Counseling  Complete Complete  Palliative Care Screening Not Applicable Not Applicable Complete  Medication Review Oceanographer) Complete Referral to Pharmacy Referral to Pharmacy

## 2024-02-16 NOTE — Progress Notes (Signed)
 [facility] Progress Note  Patient: Peggy Armstrong    FMW:[id number]  DOA: 02/10/2024     Date of Service: the patient was seen and examined on 02/16/2024  Chief Complaint  Patient presents with   Back Pain   Brief hospital course: 78 year old woman complex PMH including inoperable duodenal adenocarcinoma, rheumatoid arthritis, CKD, multiple spontaneous vertebral compression fractures, who presented to the Emergency Department for new back pain. Evaluation was notable for profound anemia and CT showed acute L1 compression fracture. She was admitted for transfusion, pain control. GI was consulted. Hemoglobin stabilized, she eventually elected endoscopy which will be performed tomorrow. She refuses SNF and will discharge home with home health when ready.   Consultants [facility]   Procedures/Events 8/16 admit for severe anemia, new L1 compression fx with acute pain 8/20 s/p EGD:  White nummular lesions in esophageal mucosa.                            Brushings performed to rule out Candida. - Multiple inflammatoryappearing gastric polyps.  - Malignant duodenal mass with mild slow oozing taking over the entirety of the duodenum 2. PuraStat injected.   Assessment and Plan:  # Acute on chronic anemia, macrocytic, asymptomatic Severe anemia hemoglobin 5.5.  Transfused 2 units 8/16 with appropriate rise.  Hemoglobin remained stable. GI consultation appreciated.  Holding Eliquis .   8/20 s/p EGD: Oozing duodenal malignant mass, pleural State injected by GI.  Patient remains at high risk for rebleeding if DOAC will be restarted.  # Duodenal adenocarcinoma Follow-up with primary oncologist in [place] as an outpatient 8/21 patient was seen by general surgery, recommended no surgical intervention, patient is very poor candidate, recommended radiation therapy.  General surgery will discuss with oncologist Dr. Ezzard and will refer her to radiation oncology.  Patient agreed with this plan   #  Shortness of breath and wheezing possible due to acute pulmonary edema 8/20 Lasix  40 mg and 20 mg doses given Started Brovana  nebulizer twice a day Started DuoNeb every 6 hourly scheduled, transition to as needed after improvement 8/20 Solu-Medrol  40 mg IV every 12 hourly x 5 doses.  Reassess tomorrow a.m. chest x-ray: Bilateral pulmonary edema 8/21 Lasix  40 mg x 1 dose given BNP 633 elevated and TTE as below 8/22 Lasix  40 mg x 1 dose given   # Hypokalemia due to diuresis Potassium repleted Monitor electrolytes daily   Acute on chronic diastolic CHF exacerbation 8/20 CHF exacerbation after EGD BNP elevated Lasix  40 and 20 mg given on 8/20 8/21, Lasix  40 mg x 1 dose given TTE LVEF 65%, G1 DD, mild to moderate TR, moderate atrial dilatation.  No any other significant finding  # Iron  deficiency, Tsat 4%. IV Venofer  300 mg x 1 dose given Continue oral iron  supplement on discharge    Acute L1 compression fracture Pain control, early ambulation.  Consult TOC, physical therapy is recommending SNF.  Patient refusing SNF plans to return home.    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  Patient remains at high risk for rebleeding on DOAC.  Would recommend to follow-up with cardiology to restart DOAC when stable     CKD stage IIIa Creatinine appears to be at baseline around 1.05     Body mass index is 38.45 kg/m.  Interventions:  Diet: Heart healthy diet DVT Prophylaxis: SCDs  Advance goals of care discussion: Full code  Family Communication: family was present at bedside, at the time of interview.  The pt provided permission to discuss medical plan with the family. Opportunity was given to ask question and all questions were answered satisfactorily.   Disposition:  Pt is from home, admitted with symptomatic anemia,  status post EGD 8/20, still has risk of bleeding, which precludes a safe discharge. Discharge to home, when stable, most likely in 1 to 2 days.  Subjective: No significant events overnight.  Patient's breathing has improved, still has mild shortness of breath.  Agreed with the repeat dose of Lasix .  Denied any GI bleeding. Patient is trying to work with physical therapy, does not want SNF placement.    Physical Exam: General: Mild to respiratory distress, significantly improved as compared to yesterday Appear in mild distress, affect appropriate Eyes: PERRLA ENT: Oral Mucosa Clear, moist  Neck: no JVD,  Cardiovascular: S1 and S2 Present, no Murmur,  Respiratory: Equal air entry bilaterally, bilateral crackles and no wheezes  Abdomen: BS present, Soft and no tenderness,  Skin: no rashes Extremities: mild Pedal edema, no calf tenderness Neurologic: without any new focal findings Gait not checked due to patient safety concerns  Vitals:   02/16/24 0312 02/16/24 0315 02/16/24 0641 02/16/24 0952  BP:   (!) 143/107   Pulse: (!) 118 (!) 121 (!) 121   Resp: 18 (!) 23 19   Temp:   98.6 F (37 C)   TempSrc:   Oral   SpO2: 97% 98% 97% 98%  Weight:      Height:        Intake/Output Summary (Last 24 hours) at 02/16/2024 1538 Last data filed at 02/16/2024 1230 Gross per 24 hour  Intake 1320 ml  Output 2100 ml  Net -780 ml   Filed Weights   02/10/24 1506  Weight: 89.3 kg    Data Reviewed: I have personally reviewed and interpreted daily labs, tele strips, imagings as discussed above. I reviewed all nursing notes, pharmacy notes, vitals, pertinent old records I have discussed plan of care as described above with RN and patient/family.  CBC: Recent Labs  Lab 02/10/24 0851 02/11/24 0433 02/12/24 0931 02/13/24 0853 02/14/24 1005 02/15/24 0503 02/16/24 0448  WBC 5.0   < > 6.2 6.3 5.6 2.8* 4.4  NEUTROABS 3.7  --   --   --   --   --   --   HGB 5.5*   < > 8.4* 8.3* 8.5* 8.5*  8.7*  HCT 21.5*   < > 30.4* 30.2* 30.7* 30.7* 31.7*  MCV 101.4*   < > 99.3 98.1 100.0 97.5 96.4  PLT 75*   < > 72* 90* 100* 114* 172   < > = values in this interval not displayed.   Basic Metabolic Panel: Recent Labs  Lab 02/11/24 0433 02/12/24 0931 02/14/24 1042 02/15/24 0503 02/16/24 0448  NA 136 138 138 137 139  K 3.6 3.4* 4.3 3.6 3.1*  CL 105 105 107 104 103  CO2 25 23 26 23 28   GLUCOSE 93 95 89 117* 120*  BUN 25* 20 22 20  28*  CREATININE 1.19* 0.85 1.06* 0.94 0.98  CALCIUM  8.0* 8.2* 8.4* 8.1* 8.3*  MG  --   --  1.8 1.8 1.8  PHOS  --   --  3.4 3.5 2.7    Studies: ECHOCARDIOGRAM COMPLETE Result Date: 02/15/2024    ECHOCARDIOGRAM REPORT   Patient Name:   Cathlene G Membreno  Date of Exam: 02/15/2024 Medical Rec #:  [id number]         Height:       60.0 in Accession #:    [id number]        Weight:       196.9 lb Date of Birth:  09-Nov-1945         BSA:          1.854 m Patient Age:    78 years          BP:           155/73 mmHg Patient Gender: F                 HR:           80 bpm. Exam Location:  Inpatient Procedure: 2D Echo, Color Doppler and Cardiac Doppler (Both Spectral and Color            Flow Doppler were utilized during procedure). Indications:    CHF  History:        Patient has prior history of Echocardiogram examinations, most                 recent 06/20/2023. Arrythmias:Atrial Flutter.  Sonographer:    Benard Stallion Referring Phys: [id number] Loralyn Rachel IMPRESSIONS  1. Left ventricular ejection fraction, by estimation, is 60 to 65%. The left ventricle has normal function. The left ventricle has no regional wall motion abnormalities. Left ventricular diastolic parameters are consistent with Grade I diastolic dysfunction (impaired relaxation).  2. Right ventricular systolic function is normal. The right ventricular size is normal. There is mildly elevated pulmonary artery systolic pressure. The estimated right ventricular systolic pressure is 40.5 mmHg.  3. Left atrial size was  moderately dilated.  4. Right atrial size was moderately dilated.  5. The mitral valve is normal in structure. No evidence of mitral valve regurgitation. No evidence of mitral stenosis.  6. Tricuspid valve regurgitation is mild to moderate.  7. The aortic valve is tricuspid. There is mild calcification of the aortic valve. Aortic valve regurgitation is not visualized. Aortic valve sclerosis/calcification is present, without any evidence of aortic stenosis.  8. Aortic dilatation noted. There is borderline dilatation of the ascending aorta, measuring 38 mm.  9. The inferior vena cava is normal in size with greater than 50% respiratory variability, suggesting right atrial pressure of 3 mmHg. FINDINGS  Left Ventricle: Left ventricular ejection fraction, by estimation, is 60 to 65%. The left ventricle has normal function. The left ventricle has no regional wall motion abnormalities. The left ventricular internal cavity size was normal in size. There is  no left ventricular hypertrophy. Left ventricular diastolic parameters are consistent with Grade I diastolic dysfunction (impaired relaxation). Right Ventricle: The right ventricular size is normal. No increase in right ventricular wall thickness. Right ventricular systolic function is normal. There is mildly elevated pulmonary artery systolic pressure. The tricuspid regurgitant velocity is 2.85  m/s, and with an assumed right atrial pressure of 8 mmHg, the estimated right ventricular systolic pressure is 40.5 mmHg. Left Atrium: Left atrial size was moderately dilated. Right Atrium: Right atrial size was moderately dilated. Pericardium: There is no evidence of pericardial effusion. Mitral Valve: The mitral valve is normal in structure. No evidence of mitral valve regurgitation. No evidence of mitral valve stenosis. Tricuspid Valve: The tricuspid valve is normal in structure. Tricuspid valve regurgitation is mild to moderate. No evidence of tricuspid stenosis. Aortic  Valve: The aortic valve is tricuspid. There  is mild calcification of the aortic valve. Aortic valve regurgitation is not visualized. Aortic valve sclerosis/calcification is present, without any evidence of aortic stenosis. Aortic valve mean gradient measures 9.7 mmHg. Aortic valve peak gradient measures 17.2 mmHg. Aortic valve area, by VTI measures 2.15 cm. Pulmonic Valve: The pulmonic valve was normal in structure. Pulmonic valve regurgitation is trivial. No evidence of pulmonic stenosis. Aorta: Aortic dilatation noted. There is borderline dilatation of the ascending aorta, measuring 38 mm. Venous: The inferior vena cava is normal in size with greater than 50% respiratory variability, suggesting right atrial pressure of 3 mmHg. IAS/Shunts: No atrial level shunt detected by color flow Doppler.  LEFT VENTRICLE PLAX 2D LVIDd:         4.90 cm   Diastology LVIDs:         3.20 cm   LV e' medial:    6.20 cm/s LV PW:         1.05 cm   LV E/e' medial:  15.3 LV IVS:        1.10 cm   LV e' lateral:   8.05 cm/s LVOT diam:     2.20 cm   LV E/e' lateral: 11.8 LV SV:         82 LV SV Index:   44 LVOT Area:     3.80 cm  RIGHT VENTRICLE RV Basal diam:  5.40 cm RV Mid diam:    4.30 cm RV S prime:     11.30 cm/s TAPSE (M-mode): 2.0 cm LEFT ATRIUM              Index        RIGHT ATRIUM           Index LA diam:        4.10 cm  2.21 cm/m   RA Area:     27.70 cm LA Vol (A2C):   125.0 ml 67.42 ml/m  RA Volume:   106.00 ml 57.17 ml/m LA Vol (A4C):   97.0 ml  52.32 ml/m LA Biplane Vol: 111.0 ml 59.87 ml/m  AORTIC VALVE AV Area (Vmax):    2.07 cm AV Area (Vmean):   2.05 cm AV Area (VTI):     2.15 cm AV Vmax:           207.33 cm/s AV Vmean:          140.333 cm/s AV VTI:            0.383 m AV Peak Grad:      17.2 mmHg AV Mean Grad:      9.7 mmHg LVOT Vmax:         113.00 cm/s LVOT Vmean:        75.600 cm/s LVOT VTI:          0.217 m LVOT/AV VTI ratio: 0.57  AORTA Ao Root diam: 2.60 cm Ao Asc diam:  3.80 cm MITRAL VALVE                 TRICUSPID VALVE MV Area (PHT): 3.50 cm     TR Peak grad:   32.5 mmHg MV Decel Time: 217 msec     TR Vmax:        285.00 cm/s MV E velocity: 94.70 cm/s MV A velocity: 117.00 cm/s  SHUNTS MV E/A ratio:  0.81         Systemic VTI:  0.22 m  Systemic Diam: 2.20 cm Toribio Fuel MD Electronically signed by Toribio Fuel MD Signature Date/Time: 02/15/2024/6:20:29 PM    Final     Scheduled Meds:  acetaminophen   650 mg Oral Q6H   Or   acetaminophen   650 mg Rectal Q6H   allopurinol   300 mg Oral Daily   amiodarone   200 mg Oral Daily   arformoterol   15 mcg Nebulization BID   busPIRone   10 mg Oral BID   cyanocobalamin   1,000 mcg Oral Daily   diltiazem   120 mg Oral Daily   DULoxetine   20 mg Oral Daily   ipratropium-albuterol   3 mL Nebulization BID   levothyroxine   100 mcg Oral QAC breakfast   methylPREDNISolone  (SOLU-MEDROL ) injection  81.25 mg Intravenous Daily   pantoprazole   40 mg Oral BID   [START ON 02/17/2024] potassium chloride  SA  20 mEq Oral QHS   potassium chloride   40 mEq Oral Q4H   sodium chloride  flush  3 mL Intravenous Q12H   sodium chloride  flush  3 mL Intravenous Q12H   sucralfate   1 g Oral BID   Continuous Infusions: PRN Meds: diphenhydrAMINE , HYDROmorphone  (DILAUDID ) injection, ipratropium-albuterol , melatonin, methocarbamol , metoprolol  tartrate, ondansetron  **OR** ondansetron  (ZOFRAN ) IV, oxyCODONE , sodium chloride  flush  Time spent: 40 minutes  Author: ELVAN SOR. MD [facility] 02/16/2024 3:38 PM  To reach On-call, see care teams to locate the attending and reach out to them via [email or web address]. If 7PM-7AM, please contact night-coverage If you still have difficulty reaching the attending provider, please page the [facility] (Director on Call) for [facility] on amion for assistance.

## 2024-02-17 DIAGNOSIS — N3000 Acute cystitis without hematuria: Secondary | ICD-10-CM | POA: Diagnosis not present

## 2024-02-17 DIAGNOSIS — D649 Anemia, unspecified: Secondary | ICD-10-CM | POA: Diagnosis not present

## 2024-02-17 DIAGNOSIS — E039 Hypothyroidism, unspecified: Secondary | ICD-10-CM | POA: Diagnosis not present

## 2024-02-17 DIAGNOSIS — C170 Malignant neoplasm of duodenum: Secondary | ICD-10-CM | POA: Diagnosis not present

## 2024-02-17 LAB — CBC
HCT: 30 % — ABNORMAL LOW (ref 36.0–46.0)
Hemoglobin: 8.4 g/dL — ABNORMAL LOW (ref 12.0–15.0)
MCH: 26.8 pg (ref 26.0–34.0)
MCHC: 28 g/dL — ABNORMAL LOW (ref 30.0–36.0)
MCV: 95.8 fL (ref 80.0–100.0)
Platelets: 203 K/uL (ref 150–400)
RBC: 3.13 MIL/uL — ABNORMAL LOW (ref 3.87–5.11)
RDW: 18 % — ABNORMAL HIGH (ref 11.5–15.5)
WBC: 6.5 K/uL (ref 4.0–10.5)
nRBC: 0 % (ref 0.0–0.2)

## 2024-02-17 LAB — BASIC METABOLIC PANEL WITH GFR
Anion gap: 9 (ref 5–15)
BUN: 37 mg/dL — ABNORMAL HIGH (ref 8–23)
CO2: 29 mmol/L (ref 22–32)
Calcium: 8.5 mg/dL — ABNORMAL LOW (ref 8.9–10.3)
Chloride: 102 mmol/L (ref 98–111)
Creatinine, Ser: 1.27 mg/dL — ABNORMAL HIGH (ref 0.44–1.00)
GFR, Estimated: 43 mL/min — ABNORMAL LOW (ref >60)
Glucose, Bld: 107 mg/dL — ABNORMAL HIGH (ref 70–99)
Potassium: 4 mmol/L (ref 3.5–5.1)
Sodium: 140 mmol/L (ref 135–145)

## 2024-02-17 LAB — PHOSPHORUS: Phosphorus: 2.1 mg/dL — ABNORMAL LOW (ref 2.5–4.6)

## 2024-02-17 LAB — MAGNESIUM: Magnesium: 1.8 mg/dL (ref 1.7–2.4)

## 2024-02-17 MED ORDER — SODIUM PHOSPHATES 45 MMOLE/15ML IV SOLN
15.0000 mmol | Freq: Once | INTRAVENOUS | Status: AC
  Administered 2024-02-17: 15 mmol via INTRAVENOUS
  Filled 2024-02-17: qty 5

## 2024-02-17 MED ORDER — DILTIAZEM HCL ER COATED BEADS 180 MG PO CP24: 180.0000 mg | ORAL_CAPSULE | Freq: Every day | ORAL | Status: DC

## 2024-02-17 MED ORDER — DILTIAZEM HCL 60 MG PO TABS
60.0000 mg | ORAL_TABLET | Freq: Once | ORAL | Status: AC
  Administered 2024-02-17: 60 mg via ORAL
  Filled 2024-02-17: qty 1

## 2024-02-17 NOTE — Progress Notes (Signed)
 [facility]  PROGRESS NOTE  Peggy Armstrong FMW:[id number] DOB: 1946/02/16 DOA: 02/10/2024 PCP: Iven [name] DASEN, PA-C   Brief HPI:   78 year old woman complex PMH including inoperable duodenal adenocarcinoma, rheumatoid arthritis, CKD, multiple spontaneous vertebral compression fractures, who presented to the Emergency Department for new back pain. Evaluation was notable for profound anemia and CT showed acute L1 compression fracture. She was admitted for transfusion, pain control. GI was consulted. Hemoglobin stabilized, she eventually elected endoscopy which will be performed tomorrow. She refuses SNF and will discharge home with home health when ready.  Procedures/Events 8/16 admit for severe anemia, new L1 compression fx with acute pain 8/20 s/p EGD:  White nummular lesions in esophageal mucosa.                            Brushings performed to rule out Candida. - Multiple inflammatoryappearing gastric polyps.  - Malignant duodenal mass with mild slow oozing taking over the entirety of the duodenum 2. PuraStat injected.     Assessment/Plan:    Acute on chronic anemia, macrocytic, asymptomatic Severe anemia hemoglobin 5.5.  Transfused 2 units 8/16 with appropriate rise.  Hemoglobin remained stable. GI consultation appreciated.  Holding Eliquis .   8/20 s/p EGD: Oozing duodenal malignant mass, pleural State injected by GI.  Patient remains at high risk for rebleeding if DOAC will be restarted.   # Duodenal adenocarcinoma Follow-up with primary oncologist in [place] as an outpatient 8/21 patient was seen by general surgery, recommended no surgical intervention, patient is very poor candidate, recommended radiation therapy.  General surgery will discuss with oncologist Dr. Ezzard and will refer her to radiation oncology.  Patient agreed with this plan     # Shortness of breath and wheezing possible due to acute pulmonary edema 8/20 Lasix  40 mg and 20 mg doses given Started  Brovana  nebulizer twice a day Started DuoNeb every 6 hourly scheduled, transition to as needed after improvement 8/20 Solu-Medrol  40 mg IV every 12 hourly x 5 doses.   chest x-ray: Bilateral pulmonary edema 8/21 Lasix  40 mg x 1 dose given BNP 633 elevated and TTE as below 8/22 Lasix  40 mg x 1 dose given -Net -5.2 L     # Hypokalemia due to diuresis Replete     Acute on chronic diastolic CHF exacerbation 8/20 CHF exacerbation after EGD BNP elevated Lasix  40 and 20 mg given on 8/20 8/21, Lasix  40 mg x 1 dose given TTE LVEF 65%, G1 DD, mild to moderate TR, moderate atrial dilatation.  No any other significant finding   # Iron  deficiency, Tsat 4%. IV Venofer  300 mg x 1 dose given Continue oral iron  supplement on discharge     Acute L1 compression fracture Pain control, early ambulation.  Consult TOC, physical therapy is recommending SNF.  Patient refusing SNF plans to return home.     Chronic thrombocytopenia Chronic since at least January, stable   Acquired hypothyroidism Continue levothyroxine    Atypical atrial flutter Currently on amiodarone , diltiazem  120 mg daily Holding apixaban .  Patient remains at high risk for rebleeding on DOAC.  Would recommend to follow-up with cardiology to restart DOAC when stable -EKG obtained today shows junctional tachycardia, heart rate remains elevated in 120s.  Cardiology will see in AM.  Will increase dose of Cardizem  CD to 180 mg daily -Will consult cardiology for medication adjustment before discharge     CKD stage IIIa Creatinine elevated due to diuresis -Holding Lasix  at  this time -Follow renal function in a.m.  Hypophosphatemia Phosphorus level 2.1 -Replace phosphorus and check phosphorus level in a.m.   Body mass index is 38.45 kg/m.     Medications     acetaminophen   650 mg Oral Q6H   Or   acetaminophen   650 mg Rectal Q6H   allopurinol   300 mg Oral Daily   amiodarone   200 mg Oral Daily   arformoterol   15 mcg  Nebulization BID   busPIRone   10 mg Oral BID   cyanocobalamin   1,000 mcg Oral Daily   diltiazem   120 mg Oral Daily   DULoxetine   20 mg Oral Daily   ipratropium-albuterol   3 mL Nebulization BID   levothyroxine   100 mcg Oral QAC breakfast   methylPREDNISolone  (SOLU-MEDROL ) injection  81.25 mg Intravenous Daily   pantoprazole   40 mg Oral BID   potassium chloride  SA  20 mEq Oral QHS   sodium chloride  flush  3 mL Intravenous Q12H   sodium chloride  flush  3 mL Intravenous Q12H   sucralfate   1 g Oral BID     Data Reviewed:   CBG:  No results for input(s): GLUCAP in the last 168 hours.  SpO2: 97 % O2 Flow Rate (L/min): 2 L/min FiO2 (%): 21 %    Vitals:   02/16/24 1958 02/16/24 2044 02/17/24 0153 02/17/24 0556  BP:   (!) 141/97 (!) 135/103  Pulse: (!) 120  (!) 121 (!) 120  Resp:   17 20  Temp:   98.2 F (36.8 C) 98 F (36.7 C)  TempSrc:   Oral   SpO2:  98% 97% 97%  Weight:      Height:          Data Reviewed:  Basic Metabolic Panel: Recent Labs  Lab 02/12/24 0931 02/14/24 1042 02/15/24 0503 02/16/24 0448 02/17/24 0541  NA 138 138 137 139 140  K 3.4* 4.3 3.6 3.1* 4.0  CL 105 107 104 103 102  CO2 23 26 23 28 29   GLUCOSE 95 89 117* 120* 107*  BUN 20 22 20  28* 37*  CREATININE 0.85 1.06* 0.94 0.98 1.27*  CALCIUM  8.2* 8.4* 8.1* 8.3* 8.5*  MG  --  1.8 1.8 1.8 1.8  PHOS  --  3.4 3.5 2.7 2.1*    CBC: Recent Labs  Lab 02/10/24 0851 02/11/24 0433 02/13/24 0853 02/14/24 1005 02/15/24 0503 02/16/24 0448 02/17/24 0541  WBC 5.0   < > 6.3 5.6 2.8* 4.4 6.5  NEUTROABS 3.7  --   --   --   --   --   --   HGB 5.5*   < > 8.3* 8.5* 8.5* 8.7* 8.4*  HCT 21.5*   < > 30.2* 30.7* 30.7* 31.7* 30.0*  MCV 101.4*   < > 98.1 100.0 97.5 96.4 95.8  PLT 75*   < > 90* 100* 114* 172 203   < > = values in this interval not displayed.    LFT Recent Labs  Lab 02/10/24 0851  AST 34  ALT 22  ALKPHOS 187*  BILITOT 0.7  PROT 5.7*  ALBUMIN 2.4*      Antibiotics: Anti-infectives (From admission, onward)    Start     Dose/Rate Route Frequency Ordered Stop   02/10/24 1000  cephALEXin  (KEFLEX ) capsule 500 mg        500 mg Oral  Once 02/10/24 0950 02/10/24 1014        DVT prophylaxis: SCDs  Code Status: Full code  Family Communication: No  family at bedside   CONSULTS    Subjective   Complains of increased heart rate on ambulation.  EKG obtained today shows atypical atrial flutter versus junctional tachycardia   Objective    Physical Examination:  General-appears in no acute distress Heart-S1-S2, irregular Lungs-clear to auscultation bilaterally, no wheezing or crackles auscultated Abdomen-soft, nontender, no organomegaly Extremities-no edema in the lower extremities Neuro-alert, oriented x3, no focal deficit noted  Status is: Inpatient:             Everitt Wenner S Khyle Goodell   [facility] If 7PM-7AM, please contact night-coverage at [email or web address], Office  [phone]   02/17/2024, 8:14 AM  LOS: 6 days

## 2024-02-17 NOTE — Progress Notes (Signed)
 Daily Progress Note   Patient Name: Peggy Armstrong       Date: 02/17/2024 DOB: 25-Jul-1945  Age: 78 y.o. MRN#: [id number] Attending Physician: Drusilla Sabas RAMAN, MD Primary Care Physician: Iven [name] [name] Admit Date: 02/10/2024  Reason for Consultation/Follow-up: Establishing goals of care  Subjective: Awake alert In good spirits No acute distress  Length of Stay: 6  Current Medications: Scheduled Meds:   acetaminophen   650 mg Oral Q6H   Or   acetaminophen   650 mg Rectal Q6H   allopurinol   300 mg Oral Daily   amiodarone   200 mg Oral Daily   arformoterol   15 mcg Nebulization BID   busPIRone   10 mg Oral BID   cyanocobalamin   1,000 mcg Oral Daily   diltiazem   120 mg Oral Daily   DULoxetine   20 mg Oral Daily   ipratropium-albuterol   3 mL Nebulization BID   levothyroxine   100 mcg Oral QAC breakfast   pantoprazole   40 mg Oral BID   potassium chloride  SA  20 mEq Oral QHS   sodium chloride  flush  3 mL Intravenous Q12H   sodium chloride  flush  3 mL Intravenous Q12H   sucralfate   1 g Oral BID    Continuous Infusions:   PRN Meds: diphenhydrAMINE , HYDROmorphone  (DILAUDID ) injection, ipratropium-albuterol , melatonin, methocarbamol , ondansetron  **OR** ondansetron  (ZOFRAN ) IV, oxyCODONE , sodium chloride  flush  Physical Exam         Awake alert No distress currently Appears with generalized weakness  Vital Signs: BP (!) 158/83 (BP Location: Right Arm)   Pulse (!) 59   Temp 97.7 F (36.5 C)   Resp 17   Ht 5' (1.524 m)   Wt 89.3 kg   SpO2 98%   BMI 38.45 kg/m  SpO2: SpO2: 98 % O2 Device: O2 Device: Room Air O2 Flow Rate: O2 Flow Rate (L/min): 2 L/min  Intake/output summary:  Intake/Output Summary (Last 24 hours) at 02/17/2024 1330 Last data filed at 02/17/2024  0600 Gross per 24 hour  Intake --  Output 1300 ml  Net -1300 ml   LBM: Last BM Date : 02/15/24 Baseline Weight: Weight: 89.3 kg Most recent weight: Weight: 89.3 kg       Palliative Assessment/Data:      Patient Active Problem List   Diagnosis Date Noted   Anemia due to  blood loss, acute 02/15/2024   IDA (iron  deficiency anemia) 02/15/2024   Anemia 02/14/2024   Mucosal abnormality of esophagus 02/14/2024   Chronic anticoagulation 02/12/2024   Acute on chronic anemia 02/12/2024   Severe anemia 02/10/2024   Closed compression fracture of body of L1 vertebra (HCC) 02/10/2024   Lumbar radiculopathy 12/15/2023   Acquired hypothyroidism    Fall at home, initial encounter 10/21/2023   Left humeral fracture 10/21/2023   Left elbow avulsion fracture 10/21/2023   History of GI bleed 10/21/2023   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   History of depression 10/21/2023   Atrial fibrillation (HCC)    Back injury    DVT (deep venous thrombosis) (HCC)    Hypertension    Cellulitis of left arm 08/14/2023   Hemorrhagic shock (HCC) 07/21/2023   ABLA (acute blood loss anemia) 07/21/2023   Acute GI bleeding 07/21/2023   AKI (acute kidney injury) (HCC) 07/21/2023   GI bleed 07/20/2023   Chronic low back pain 07/18/2023   Upper GI bleed 06/20/2023   Long term current use of anticoagulant therapy 06/20/2023   Occult blood in stools 05/16/2023   Gastric polyps 05/16/2023   Generalized weakness 05/12/2023   Acute cystitis 05/12/2023   Dehydration 05/12/2023   Acute prerenal azotemia 05/12/2023   Paroxysmal atrial fibrillation (HCC) 05/12/2023   AAA (abdominal aortic aneurysm) (HCC) 05/12/2023   Hyperlipidemia 05/12/2023   GAD (generalized anxiety disorder) 05/12/2023   Hypothyroidism 05/12/2023   Closed compression fracture of L2 lumbar vertebra, initial encounter (HCC) 05/11/2023   Restless leg 12/02/2022   Routine general medical examination at a health care facility  12/13/2019   Chronic diastolic CHF (congestive heart failure) (HCC) 11/08/2019   Morbid obesity (HCC) 11/08/2019   History of DVT (deep vein thrombosis) 11/08/2019   Need for immunization against influenza 07/18/2019   Right shoulder pain 07/26/2018   Need for vaccination 04/23/2018   Duodenal ulcer    Hematemesis 11/05/2017   Macrocytic anemia 11/05/2017   Melena 11/05/2017   Nausea & vomiting    Hypokalemia 07/03/2017   Essential hypertension    Gout    Clotting disorder (HCC)    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   CKD (chronic kidney disease) stage 3, GFR 30-59 ml/min (HCC) 06/09/2017   CKD (chronic kidney disease), stage III (HCC) 06/09/2017   S/P ERCP 05/05/2017   Peri-ampullary neoplasm    Atypical atrial flutter (HCC)    Duodenal mass    Cholangitis    Epigastric pain    Choledocholithiasis 03/22/2017   SIRS (systemic inflammatory response syndrome) (HCC) 03/22/2017   Poor dentition 08/14/2016   Iron  deficiency anemia 08/02/2016   RA (rheumatoid arthritis) (HCC) 09/07/2015    Palliative Care Assessment & Plan   Patient Profile:    Assessment:  78 year old woman complex PMH including inoperable duodenal adenocarcinoma, rheumatoid arthritis, CKD, multiple spontaneous vertebral compression fractures, who presented to the Emergency Department for new back pain. Evaluation was notable for profound anemia and CT showed acute L1 compression fracture. She was admitted for transfusion, pain control.  8/20 s/p EGD:  White nummular lesions in esophageal mucosa.                            Brushings performed to rule out Candida. - Multiple inflammatoryappearing gastric polyps.  - Malignant duodenal mass with mild slow oozing taking over the entirety of the duodenum 2. PuraStat injected.  Recommendations/Plan:  Discussed with TOC, patient  wishes for full code full scope care for now, wants to go back to her oncologist in [place] and discuss further GOC, does not want  SNF rehab, wants home PT, recommend community based palliative care for additional support.  8-23: I encouraged the patient to discuss frankly with her oncologist on her next appointment at Aug 29 with regards to her prognosis. Her daughter Anique Beckley is her HCPOA.   Goals of Care and Additional Recommendations: Limitations on Scope of Treatment: Full Scope Treatment  Code Status:    Code Status Orders  (From admission, onward)           Start     Ordered   02/10/24 1612  Full code  Continuous       Question:  By:  Answer:  Consent: discussion documented in EHR   02/10/24 1612           Code Status History     Date Active Date Inactive Code Status Order ID Comments User Context   10/21/2023 2353 10/25/2023 1845 Full Code [id number]  Lee Kingfisher, MD ED   07/20/2023 1733 07/27/2023 1930 Full Code [id number]  Cheryle Page, MD ED   06/20/2023 0756 06/21/2023 1724 Full Code [id number]  Zella Katha HERO, MD ED   05/11/2023 2325 05/18/2023 2000 Full Code [id number]  Howerter, Eva NOVAK, DO ED   11/05/2017 0127 11/09/2017 2147 Full Code [id number]  [name] Agent, MD ED   03/22/2017 2340 03/30/2017 2110 Full Code [id number]  Franky Redia SAILOR, MD ED       Prognosis:  Unable to determine  Discharge Planning: Home with Palliative Services  Care plan was discussed with   patient  Thank you for allowing the Palliative Medicine Team to assist in the care of this patient.  mod MDM     Greater than 50%  of this time was spent counseling and coordinating care related to the above assessment and plan.  Lonia Serve, MD  Please contact Palliative Medicine Team phone at [phone] for questions and concerns.

## 2024-02-17 NOTE — Plan of Care (Signed)

## 2024-02-18 ENCOUNTER — Encounter (HOSPITAL_COMMUNITY): Payer: Self-pay | Admitting: Gastroenterology

## 2024-02-18 ENCOUNTER — Inpatient Hospital Stay (HOSPITAL_COMMUNITY)

## 2024-02-18 DIAGNOSIS — I5031 Acute diastolic (congestive) heart failure: Secondary | ICD-10-CM | POA: Diagnosis not present

## 2024-02-18 DIAGNOSIS — I700 Atherosclerosis of aorta: Secondary | ICD-10-CM | POA: Diagnosis not present

## 2024-02-18 DIAGNOSIS — I480 Paroxysmal atrial fibrillation: Secondary | ICD-10-CM | POA: Diagnosis not present

## 2024-02-18 DIAGNOSIS — I509 Heart failure, unspecified: Secondary | ICD-10-CM | POA: Diagnosis not present

## 2024-02-18 DIAGNOSIS — D649 Anemia, unspecified: Secondary | ICD-10-CM | POA: Diagnosis not present

## 2024-02-18 DIAGNOSIS — S42202A Unspecified fracture of upper end of left humerus, initial encounter for closed fracture: Secondary | ICD-10-CM | POA: Diagnosis not present

## 2024-02-18 DIAGNOSIS — R918 Other nonspecific abnormal finding of lung field: Secondary | ICD-10-CM | POA: Diagnosis not present

## 2024-02-18 LAB — CBC
HCT: 30.7 % — ABNORMAL LOW (ref 36.0–46.0)
Hemoglobin: 8.4 g/dL — ABNORMAL LOW (ref 12.0–15.0)
MCH: 26.8 pg (ref 26.0–34.0)
MCHC: 27.4 g/dL — ABNORMAL LOW (ref 30.0–36.0)
MCV: 97.8 fL (ref 80.0–100.0)
Platelets: 190 K/uL (ref 150–400)
RBC: 3.14 MIL/uL — ABNORMAL LOW (ref 3.87–5.11)
RDW: 18 % — ABNORMAL HIGH (ref 11.5–15.5)
WBC: 5.6 K/uL (ref 4.0–10.5)
nRBC: 0.4 % — ABNORMAL HIGH (ref 0.0–0.2)

## 2024-02-18 LAB — BASIC METABOLIC PANEL WITH GFR
Anion gap: 9 (ref 5–15)
BUN: 40 mg/dL — ABNORMAL HIGH (ref 8–23)
CO2: 27 mmol/L (ref 22–32)
Calcium: 8.4 mg/dL — ABNORMAL LOW (ref 8.9–10.3)
Chloride: 104 mmol/L (ref 98–111)
Creatinine, Ser: 1.35 mg/dL — ABNORMAL HIGH (ref 0.44–1.00)
GFR, Estimated: 40 mL/min — ABNORMAL LOW (ref >60)
Glucose, Bld: 107 mg/dL — ABNORMAL HIGH (ref 70–99)
Potassium: 3.9 mmol/L (ref 3.5–5.1)
Sodium: 140 mmol/L (ref 135–145)

## 2024-02-18 LAB — PHOSPHORUS: Phosphorus: 3.4 mg/dL (ref 2.5–4.6)

## 2024-02-18 MED ORDER — DILTIAZEM HCL ER COATED BEADS 180 MG PO CP24
300.0000 mg | ORAL_CAPSULE | Freq: Every day | ORAL | Status: DC
  Administered 2024-02-18 – 2024-02-25 (×6): 300 mg via ORAL
  Filled 2024-02-18 (×8): qty 1

## 2024-02-18 MED ORDER — LEVALBUTEROL HCL 0.63 MG/3ML IN NEBU
0.6300 mg | INHALATION_SOLUTION | Freq: Three times a day (TID) | RESPIRATORY_TRACT | Status: DC
  Administered 2024-02-18 – 2024-02-20 (×9): 0.63 mg via RESPIRATORY_TRACT
  Filled 2024-02-18 (×10): qty 3

## 2024-02-18 NOTE — Plan of Care (Signed)
   Problem: Education: Goal: Knowledge of General Education information will improve Description: Including pain rating scale, medication(s)/side effects and non-pharmacologic comfort measures Outcome: Progressing   Problem: Clinical Measurements: Goal: Ability to maintain clinical measurements within normal limits will improve Outcome: Progressing   Problem: Activity: Goal: Risk for activity intolerance will decrease Outcome: Progressing   Problem: Nutrition: Goal: Adequate nutrition will be maintained Outcome: Progressing   Problem: Pain Managment: Goal: General experience of comfort will improve and/or be controlled Outcome: Progressing   Problem: Safety: Goal: Ability to remain free from injury will improve Outcome: Progressing

## 2024-02-18 NOTE — Consult Note (Signed)
 CARDIOLOGY CONSULT NOTE       Patient ID: Peggy Armstrong MRN: [id number] DOB/AGE: 1946-05-04 78 y.o.  Admit date: 02/10/2024 Referring Physician: Drusilla Primary Physician: Iven [name] DASEN, PA-C Primary Cardiologist: Madireddy Reason for Consultation: Afib/Pulmonary edema  Principal Problem:   Severe anemia Active Problems:   Atypical atrial flutter (HCC)   CKD (chronic kidney disease) stage 3, GFR 30-59 ml/min (HCC)   Chronic diastolic CHF (congestive heart failure) (HCC)   Duodenal adenocarcinoma (HCC)   Acquired hypothyroidism   Closed compression fracture of body of L1 vertebra (HCC)   Chronic anticoagulation   Acute on chronic anemia   Anemia   Mucosal abnormality of esophagus   HPI:  78 y.o. admitted with profound anemia likely from ongoing GI malignancy. History of PAF and diastolic dysfunction. Eliquis  held. Hb as low as 5.5 on admission Post transfusion ? Venofeer infusion as well although I cannot see that order in [facility]. In hospital has been in afib/flutter rate control around 100 bpm. No long pauses. Yesterday developed dyspnea , wheezing and ? Pulmonary edema. CXR consistent with this or ARDS. BNP 631. Has improved somewhat with iv lasix  but still wheezing TTE done yesterday with EF 60-65% no LVH indeterminate diastolic function. RV normal no effusion mild/mod TR   Currently Hb improved to 8.4 needs updated CXR. No chest pain /angina Troponin not checked but not a candidate for cath and ECG with no acute ST changes   From chart her GI duodenal cancer appears palliative and non curable. 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   Cellulitis of left arm 08/14/2023   Cholangitis    Choledocholithiasis 03/22/2017   Chronic diastolic CHF (congestive heart failure) (HCC) 11/08/2019   Chronic low back pain 07/18/2023   CKD (chronic kidney disease) stage 3, GFR 30-59 ml/min (HCC) 06/09/2017   CKD (chronic kidney disease), stage III (HCC) 06/09/2017   Closed compression fracture of L2 lumbar vertebra, initial encounter (HCC) 05/11/2023   Clotting disorder (HCC)    right DVT     Dehydration 05/12/2023   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   Duodenal mass    Duodenal ulcer    DVT (deep venous thrombosis) (HCC)    right DVT   Epigastric pain    Essential hypertension    Fall at home, initial encounter 10/21/2023   GAD (generalized anxiety disorder)    Gastric polyps 05/16/2023   Generalized weakness 05/12/2023   GI bleed 07/20/2023   Gout    Gout    Hematemesis 11/05/2017   Hemorrhagic shock (HCC) 07/21/2023   History of depression 10/21/2023   History of DVT (deep vein thrombosis) 11/08/2019   History of GI bleed 10/21/2023   Hyperlipidemia 05/12/2023   Hypertension    Hypokalemia 07/03/2017   Hypothyroidism 05/12/2023   Iron  deficiency anemia 08/02/2016   Left elbow avulsion fracture 10/21/2023   Left humeral fracture 10/21/2023  Long term current use of anticoagulant therapy 06/20/2023   Macrocytic anemia 11/05/2017   Melena 11/05/2017   Morbid obesity (HCC) 11/08/2019   Nausea & vomiting    Need for immunization against influenza 07/18/2019   Need for vaccination 04/23/2018   Occult blood in stools 05/16/2023   Paroxysmal atrial fibrillation (HCC) 05/12/2023   Peri-ampullary neoplasm    Poor dentition 08/14/2016   RA (rheumatoid arthritis) (HCC)    Restless leg 12/02/2022    Right shoulder pain 07/26/2018   Routine general medical examination at a health care facility 12/13/2019   S/P ERCP 05/05/2017   SIRS (systemic inflammatory response syndrome) (HCC) 03/22/2017   Upper GI bleed 06/20/2023    Family History  Problem Relation Age of Onset   Hypertension Mother    Prostate cancer Father    Colon cancer Father    Hypertension Brother    Rheum arthritis Maternal Grandmother    Diabetes Paternal Grandmother    Heart disease Paternal Grandfather    Colon cancer Child     Social History   Socioeconomic History   Marital status: Divorced    Spouse name: Not on file   Number of children: 2   Years of education: 11   Highest education level: Not on file  Occupational History   Occupation: RETIRED - FACTORY WORKER  Tobacco Use   Smoking status: Former    Types: Cigarettes    Passive exposure: Past   Smokeless tobacco: Former   Tobacco comments:    Quit in 2012. Started at 20's. Cannot recall how many.   Vaping Use   Vaping status: Never Used  Substance and Sexual Activity   Alcohol use: No   Drug use: No   Sexual activity: Yes    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  Other Topics Concern   Not on file  Social History Narrative   Not on file   Social Drivers of Health   Financial Resource Strain: Low Risk  (12/02/2022)   Received from [facility]   Overall Financial Resource Strain (CARDIA)    Difficulty of Paying Living Expenses: Not very hard  Food Insecurity: No Food Insecurity (02/10/2024)   Hunger Vital Sign    Worried About Running Out of Food in the Last Year: Never true    Ran Out of Food in the Last Year: Never true  Transportation Needs: No Transportation Needs (02/10/2024)   PRAPARE - Administrator, Civil Service (Medical): No    Lack of Transportation (Non-Medical): No  Physical Activity: Unknown (04/05/2022)   Received from [facility]   Exercise Vital Sign    On average, how many days per week do you  engage in moderate to strenuous exercise (like a brisk walk)?: 0 days    Minutes of Exercise per Session: Not on file  Stress: No Stress Concern Present (04/05/2022)   Received from [facility] - Occupational Stress Questionnaire    Feeling of Stress : Not at all  Social Connections: Moderately Isolated (02/10/2024)   Social Connection and Isolation Panel    Frequency of Communication with Friends and Family: Twice a week    Frequency of Social Gatherings with Friends and Family: Twice a week    Attends Religious Services: 1 to 4 times per year    Active Member of [employer] or Organizations: No    Attends Banker Meetings: Never    Marital Status: Divorced  Catering manager Violence:  Not At Risk (02/10/2024)   Humiliation, Afraid, Rape, and Kick questionnaire    Fear of Current or Ex-Partner: No    Emotionally Abused: No    Physically Abused: No    Sexually Abused: No    Past Surgical History:  Procedure Laterality Date   ABDOMINAL HYSTERECTOMY     BIOPSY  05/16/2023   Procedure: BIOPSY;  Surgeon: Aneita Gwendlyn DASEN, MD;  Location: [facility];  Service: Gastroenterology;;   CHOLECYSTECTOMY     ENDOSCOPIC RETROGRADE CHOLANGIOPANCREATOGRAPHY (ERCP) WITH PROPOFOL  N/A 06/15/2017   Procedure: ENDOSCOPIC RETROGRADE CHOLANGIOPANCREATOGRAPHY (ERCP) WITH PROPOFOL ;  Surgeon: Teressa Toribio SQUIBB, MD;  Location: [facility];  Service: Endoscopy;  Laterality: N/A;   ESOPHAGOGASTRODUODENOSCOPY (EGD) WITH PROPOFOL  N/A 03/24/2017   Procedure: ESOPHAGOGASTRODUODENOSCOPY (EGD) WITH PROPOFOL ;  Surgeon: Legrand Victory LITTIE DOUGLAS, MD;  Location: [facility];  Service: Gastroenterology;  Laterality: N/A;   ESOPHAGOGASTRODUODENOSCOPY (EGD) WITH PROPOFOL  N/A 11/07/2017   Procedure: ESOPHAGOGASTRODUODENOSCOPY (EGD) WITH PROPOFOL ;  Surgeon: Teressa Toribio SQUIBB, MD;  Location: [facility];  Service: Endoscopy;  Laterality: N/A;   ESOPHAGOGASTRODUODENOSCOPY (EGD) WITH  PROPOFOL  N/A 05/16/2023   Procedure: ESOPHAGOGASTRODUODENOSCOPY (EGD) WITH PROPOFOL ;  Surgeon: Aneita Gwendlyn DASEN, MD;  Location: [facility];  Service: Gastroenterology;  Laterality: N/A;   ESOPHAGOGASTRODUODENOSCOPY (EGD) WITH PROPOFOL  N/A 07/21/2023   Procedure: ESOPHAGOGASTRODUODENOSCOPY (EGD) WITH PROPOFOL ;  Surgeon: Charlanne Groom, MD;  Location: [facility];  Service: Gastroenterology;  Laterality: N/A;   EUS N/A 04/06/2017   Procedure: UPPER ENDOSCOPIC ULTRASOUND (EUS) LINEAR;  Surgeon: Teressa Toribio SQUIBB, MD;  Location: [facility];  Service: Endoscopy;  Laterality: N/A;  to evaluate duodenal mass   HEMOSTASIS CLIP PLACEMENT  07/21/2023   Procedure: HEMOSTASIS CLIP PLACEMENT;  Surgeon: Charlanne Groom, MD;  Location: [facility] ENDOSCOPY;  Service: Gastroenterology;;   HEMOSTASIS CONTROL  07/21/2023   Procedure: HEMOSTASIS CONTROL;  Surgeon: Charlanne Groom, MD;  Location: [facility];  Service: Gastroenterology;;   HERNIA REPAIR     IR ANGIOGRAM VISCERAL SELECTIVE  07/22/2023   IR BILIARY DRAIN PLACEMENT WITH CHOLANGIOGRAM  03/25/2017   IR CONVERT BILIARY DRAIN TO INT EXT BILIARY DRAIN  04/03/2017   IR EMBO ART  VEN HEMORR LYMPH EXTRAV  INC GUIDE ROADMAPPING  07/22/2023   IR US  GUIDE VASC ACCESS RIGHT  07/22/2023   POLYPECTOMY  05/16/2023   Procedure: POLYPECTOMY;  Surgeon: Aneita Gwendlyn DASEN, MD;  Location: [facility];  Service: Gastroenterology;;      Current Facility-Administered Medications:    acetaminophen  (TYLENOL ) tablet 650 mg, 650 mg, Oral, Q6H, 650 mg at 02/18/24 0527 **OR** acetaminophen  (TYLENOL ) suppository 650 mg, 650 mg, Rectal, Q6H, Jadine Toribio SQUIBB, MD   allopurinol  (ZYLOPRIM ) tablet 300 mg, 300 mg, Oral, Daily, Jadine Toribio SQUIBB, MD, 300 mg at 02/17/24 9057   amiodarone  (PACERONE ) tablet 200 mg, 200 mg, Oral, Daily, Jadine Toribio SQUIBB, MD, 200 mg at 02/17/24 9056   arformoterol  (BROVANA ) nebulizer solution 15 mcg, 15 mcg, Nebulization, BID, Von Bellis, MD, 15 mcg at 02/17/24  1928   busPIRone  (BUSPAR ) tablet 10 mg, 10 mg, Oral, BID, Jadine Toribio SQUIBB, MD, 10 mg at 02/17/24 2126   cyanocobalamin  (VITAMIN B12) tablet 1,000 mcg, 1,000 mcg, Oral, Daily, Jadine Toribio SQUIBB, MD, 1,000 mcg at 02/17/24 9057   diltiazem  (CARDIZEM  CD) 24 hr capsule 180 mg, 180 mg, Oral, Daily, Drusilla, Sabas RAMAN, MD   diphenhydrAMINE  (BENADRYL ) capsule 25 mg, 25 mg, Oral, Q6H PRN, Jadine Toribio SQUIBB, MD, 25 mg at 02/14/24 1713   DULoxetine  (CYMBALTA ) DR capsule 20 mg, 20 mg, Oral,  Daily, Jadine Toribio SQUIBB, MD, 20 mg at 02/17/24 9056   HYDROmorphone  (DILAUDID ) injection 0.5-1 mg, 0.5-1 mg, Intravenous, Q2H PRN, Jadine Toribio SQUIBB, MD, 1 mg at 02/14/24 2308   ipratropium-albuterol  (DUONEB) 0.5-2.5 (3) MG/3ML nebulizer solution 3 mL, 3 mL, Nebulization, Q4H PRN, Von Bellis, MD, 3 mL at 02/14/24 2224   ipratropium-albuterol  (DUONEB) 0.5-2.5 (3) MG/3ML nebulizer solution 3 mL, 3 mL, Nebulization, BID, Von Bellis, MD, 3 mL at 02/17/24 1928   levothyroxine  (SYNTHROID ) tablet 100 mcg, 100 mcg, Oral, QAC breakfast, Jadine Toribio SQUIBB, MD, 100 mcg at 02/18/24 9472   melatonin tablet 5 mg, 5 mg, Oral, QHS PRN, Chavez, Abigail, NP, 5 mg at 02/17/24 2214   methocarbamol  (ROBAXIN ) tablet 500 mg, 500 mg, Oral, Q6H PRN, Jadine Toribio SQUIBB, MD   ondansetron  (ZOFRAN ) tablet 4 mg, 4 mg, Oral, Q6H PRN **OR** ondansetron  (ZOFRAN ) injection 4 mg, 4 mg, Intravenous, Q6H PRN, Jadine Toribio SQUIBB, MD   oxyCODONE  (Oxy IR/ROXICODONE ) immediate release tablet 5-10 mg, 5-10 mg, Oral, Q4H PRN, Jadine Toribio SQUIBB, MD, 10 mg at 02/13/24 2323   pantoprazole  (PROTONIX ) EC tablet 40 mg, 40 mg, Oral, BID, Mansouraty, Gabriel Jr., MD, 40 mg at 02/17/24 2132   potassium chloride  SA (KLOR-CON  M) CR tablet 20 mEq, 20 mEq, Oral, QHS, Von Bellis, MD, 20 mEq at 02/17/24 2126   sodium chloride  flush (NS) 0.9 % injection 3 mL, 3 mL, Intravenous, Q12H, Jadine Toribio SQUIBB, MD, 3 mL at 02/13/24 9072   sodium chloride  flush (NS) 0.9 %  injection 3 mL, 3 mL, Intravenous, Q12H, Jadine Toribio SQUIBB, MD, 3 mL at 02/17/24 2206   sodium chloride  flush (NS) 0.9 % injection 3 mL, 3 mL, Intravenous, PRN, Jadine Toribio SQUIBB, MD, 3 mL at 02/10/24 2124   sucralfate  (CARAFATE ) tablet 1 g, 1 g, Oral, BID, Mansouraty, Aloha Raddle., MD, 1 g at 02/17/24 2126  acetaminophen   650 mg Oral Q6H   Or   acetaminophen   650 mg Rectal Q6H   allopurinol   300 mg Oral Daily   amiodarone   200 mg Oral Daily   arformoterol   15 mcg Nebulization BID   busPIRone   10 mg Oral BID   cyanocobalamin   1,000 mcg Oral Daily   diltiazem   180 mg Oral Daily   DULoxetine   20 mg Oral Daily   ipratropium-albuterol   3 mL Nebulization BID   levothyroxine   100 mcg Oral QAC breakfast   pantoprazole   40 mg Oral BID   potassium chloride  SA  20 mEq Oral QHS   sodium chloride  flush  3 mL Intravenous Q12H   sodium chloride  flush  3 mL Intravenous Q12H   sucralfate   1 g Oral BID     Physical Exam: Blood pressure 123/89, pulse (!) 120, temperature 97.7 F (36.5 C), temperature source Oral, resp. rate 18, height 5' (1.524 m), weight 89.3 kg, SpO2 92%.    Chronically ill pale female Exp wheezing persists bilaterally No murmur Abdomen benign Trace edema with LE varicosities   Labs:   Lab Results  Component Value Date   WBC 5.6 02/18/2024   HGB 8.4 (L) 02/18/2024   HCT 30.7 (L) 02/18/2024   MCV 97.8 02/18/2024   PLT 190 02/18/2024    Recent Labs  Lab 02/18/24 0437  NA 140  K 3.9  CL 104  CO2 27  BUN 40*  CREATININE 1.35*  CALCIUM  8.4*  GLUCOSE 107*   Lab Results  Component Value Date   CKTOTAL 32 (L) 10/21/2023    Lab  Results  Component Value Date   CHOL 127 07/18/2023   Lab Results  Component Value Date   HDL 25 (L) 07/18/2023   Lab Results  Component Value Date   LDLCALC 80 07/18/2023   Lab Results  Component Value Date   TRIG 122 07/18/2023   Lab Results  Component Value Date   CHOLHDL 5.1 (H) 07/18/2023   No results found for:  LDLDIRECT    Radiology: ECHOCARDIOGRAM COMPLETE Result Date: 02/15/2024    ECHOCARDIOGRAM REPORT   Patient Name:   QUINNIE BARCELO Date of Exam: 02/15/2024 Medical Rec #:  [id number]         Height:       60.0 in Accession #:    [id number]        Weight:       196.9 lb Date of Birth:  07-May-1946         BSA:          1.854 m Patient Age:    78 years          BP:           155/73 mmHg Patient Gender: F                 HR:           80 bpm. Exam Location:  Inpatient Procedure: 2D Echo, Color Doppler and Cardiac Doppler (Both Spectral and Color            Flow Doppler were utilized during procedure). Indications:    CHF  History:        Patient has prior history of Echocardiogram examinations, most                 recent 06/20/2023. Arrythmias:Atrial Flutter.  Sonographer:    Benard Stallion Referring Phys: [id number] DILEEP KUMAR IMPRESSIONS  1. Left ventricular ejection fraction, by estimation, is 60 to 65%. The left ventricle has normal function. The left ventricle has no regional wall motion abnormalities. Left ventricular diastolic parameters are consistent with Grade I diastolic dysfunction (impaired relaxation).  2. Right ventricular systolic function is normal. The right ventricular size is normal. There is mildly elevated pulmonary artery systolic pressure. The estimated right ventricular systolic pressure is 40.5 mmHg.  3. Left atrial size was moderately dilated.  4. Right atrial size was moderately dilated.  5. The mitral valve is normal in structure. No evidence of mitral valve regurgitation. No evidence of mitral stenosis.  6. Tricuspid valve regurgitation is mild to moderate.  7. The aortic valve is tricuspid. There is mild calcification of the aortic valve. Aortic valve regurgitation is not visualized. Aortic valve sclerosis/calcification is present, without any evidence of aortic stenosis.  8. Aortic dilatation noted. There is borderline dilatation of the ascending aorta, measuring 38 mm.  9. The  inferior vena cava is normal in size with greater than 50% respiratory variability, suggesting right atrial pressure of 3 mmHg. FINDINGS  Left Ventricle: Left ventricular ejection fraction, by estimation, is 60 to 65%. The left ventricle has normal function. The left ventricle has no regional wall motion abnormalities. The left ventricular internal cavity size was normal in size. There is  no left ventricular hypertrophy. Left ventricular diastolic parameters are consistent with Grade I diastolic dysfunction (impaired relaxation). Right Ventricle: The right ventricular size is normal. No increase in right ventricular wall thickness. Right ventricular systolic function is normal. There is mildly elevated pulmonary artery systolic pressure. The tricuspid regurgitant velocity is 2.85  m/s, and with an assumed right atrial pressure of 8 mmHg, the estimated right ventricular systolic pressure is 40.5 mmHg. Left Atrium: Left atrial size was moderately dilated. Right Atrium: Right atrial size was moderately dilated. Pericardium: There is no evidence of pericardial effusion. Mitral Valve: The mitral valve is normal in structure. No evidence of mitral valve regurgitation. No evidence of mitral valve stenosis. Tricuspid Valve: The tricuspid valve is normal in structure. Tricuspid valve regurgitation is mild to moderate. No evidence of tricuspid stenosis. Aortic Valve: The aortic valve is tricuspid. There is mild calcification of the aortic valve. Aortic valve regurgitation is not visualized. Aortic valve sclerosis/calcification is present, without any evidence of aortic stenosis. Aortic valve mean gradient measures 9.7 mmHg. Aortic valve peak gradient measures 17.2 mmHg. Aortic valve area, by VTI measures 2.15 cm. Pulmonic Valve: The pulmonic valve was normal in structure. Pulmonic valve regurgitation is trivial. No evidence of pulmonic stenosis. Aorta: Aortic dilatation noted. There is borderline dilatation of the ascending  aorta, measuring 38 mm. Venous: The inferior vena cava is normal in size with greater than 50% respiratory variability, suggesting right atrial pressure of 3 mmHg. IAS/Shunts: No atrial level shunt detected by color flow Doppler.  LEFT VENTRICLE PLAX 2D LVIDd:         4.90 cm   Diastology LVIDs:         3.20 cm   LV e' medial:    6.20 cm/s LV PW:         1.05 cm   LV E/e' medial:  15.3 LV IVS:        1.10 cm   LV e' lateral:   8.05 cm/s LVOT diam:     2.20 cm   LV E/e' lateral: 11.8 LV SV:         82 LV SV Index:   44 LVOT Area:     3.80 cm  RIGHT VENTRICLE RV Basal diam:  5.40 cm RV Mid diam:    4.30 cm RV S prime:     11.30 cm/s TAPSE (M-mode): 2.0 cm LEFT ATRIUM              Index        RIGHT ATRIUM           Index LA diam:        4.10 cm  2.21 cm/m   RA Area:     27.70 cm LA Vol (A2C):   125.0 ml 67.42 ml/m  RA Volume:   106.00 ml 57.17 ml/m LA Vol (A4C):   97.0 ml  52.32 ml/m LA Biplane Vol: 111.0 ml 59.87 ml/m  AORTIC VALVE AV Area (Vmax):    2.07 cm AV Area (Vmean):   2.05 cm AV Area (VTI):     2.15 cm AV Vmax:           207.33 cm/s AV Vmean:          140.333 cm/s AV VTI:            0.383 m AV Peak Grad:      17.2 mmHg AV Mean Grad:      9.7 mmHg LVOT Vmax:         113.00 cm/s LVOT Vmean:        75.600 cm/s LVOT VTI:          0.217 m LVOT/AV VTI ratio: 0.57  AORTA Ao Root diam: 2.60 cm Ao Asc diam:  3.80 cm MITRAL VALVE  TRICUSPID VALVE MV Area (PHT): 3.50 cm     TR Peak grad:   32.5 mmHg MV Decel Time: 217 msec     TR Vmax:        285.00 cm/s MV E velocity: 94.70 cm/s MV A velocity: 117.00 cm/s  SHUNTS MV E/A ratio:  0.81         Systemic VTI:  0.22 m                             Systemic Diam: 2.20 cm Toribio Fuel MD Electronically signed by Toribio Fuel MD Signature Date/Time: 02/15/2024/6:20:29 PM    Final    DG CHEST PORT 1 VIEW Result Date: 02/14/2024 CLINICAL DATA:  Shortness of breath EXAM: PORTABLE CHEST 1 VIEW COMPARISON:  CT 10/20/2023 FINDINGS: Cardiomegaly with  central vascular congestion. Small right-sided pleural effusion. Hazy edema or atelectasis at the bases. Aortic atherosclerosis. No pneumothorax. Old appearing bilateral rib fractures IMPRESSION: Cardiomegaly with central vascular congestion and small right pleural effusion. Hazy edema or atelectasis at the bases. Electronically Signed   By: [name] Bun M.D.   On: 02/14/2024 17:12   CT Lumbar Spine Wo Contrast Result Date: 02/10/2024 CLINICAL DATA:  Low back pain.  History of duodenal adenocarcinoma. EXAM: CT LUMBAR SPINE WITHOUT CONTRAST TECHNIQUE: Multidetector CT imaging of the lumbar spine was performed without intravenous contrast administration. Multiplanar CT image reconstructions were also generated. RADIATION DOSE REDUCTION: This exam was performed according to the departmental dose-optimization program which includes automated exposure control, adjustment of the mA and/or kV according to patient size and/or use of iterative reconstruction technique. COMPARISON:  Lumbar spine CT 05/11/2023 and MRI 05/13/2023. Outside CT abdomen and pelvis 10/20/2023 from [facility] FINDINGS: Segmentation: 5 lumbar type vertebrae. Alignment: Mild lumbar dextroscoliosis. Chronic grade 1 anterolisthesis of L4 on L5. Vertebrae: Chronic L2 burst fracture with severe central vertebral body height loss and 6 mm retropulsion, similar to the 10/20/2023 CT. Mild L3 compression fracture with 25% vertebral body height loss, new from 05/11/2023 and with mildly progressive height loss since 10/20/2023. New, likely acute L1 compression fracture with 25% height loss and no retropulsion. Hemangiomas in the T12 and L3 vertebral bodies. Old sacral insufficiency fractures. Old posterior right twelfth rib fracture. Paraspinal and other soft tissues: Aortic atherosclerosis. Subcutaneous edema throughout the back. Partially visualized small bowel lipoma. Partially visualized embolization coils in the region of the duodenum and pancreatic  head. Disc levels: L2 retropulsion is again noted to result in moderate spinal stenosis. Mild spinal stenosis at L4-5. IMPRESSION: 1. Acute L1 compression fracture. 2. Mild L3 compression fracture with progressive height loss since 10/20/2023. 3. 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 4.  Aortic Atherosclerosis (ICD10-I70.0). Electronically Signed   By: Dasie Hamburg M.D.   On: 02/10/2024 13:03    EKG: Low voltage atrial flutter no acute ischemic changes    ASSESSMENT AND PLAN:   CHF:  ? Diastolic with TTE showing normal RV/LV EF no valve dx. Continue bid iv lasix  update CXR Cannot r/o component of ARDS or reaction from iv Venofeer which chart indicates she was given.  Atrial flutter:  may contribute to above increase cardizem  continue low dose amiodarone . No anticoagulation despite CHADVASC 6 due to severe anemia GI cancer and ongoing bleeding Given age and co morbidities not a candidate for Watchman Lumbar Fracture:  contributes to immobility pain control seems adequate   Palliative consult appropriate as GI  does not seem to think her DU cancer is Rx Signed: Maude Emmer 02/18/2024, 8:24 AM

## 2024-02-18 NOTE — Progress Notes (Signed)
 Physical Therapy Treatment Patient Details Name: Peggy Armstrong MRN: [id number] DOB: 1945-07-03 Today's Date: 02/18/2024   History of Present Illness 78 y.o. female who presented to the emergency department yesterday with profound fatigue and back pain, found to have hemoglobin 5.5 and positive fecal occult blood. CT= Acute L1 compression fracture.  2. Mild L3 compression fracture with progressive height loss since  10/20/2023.  3. 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 GI consulted. extensive medical history to include congestive heart failure, A-fib on Eliquis , AAA, HTN, chronic back pain, history of DVT, CKD and ampullary adenocarcinoma, L humerus fx, RA, L elbow avulsion fx, LLE fx    PT Comments  Pt very cooperative but continues to require significant assist for safe performance of all basic mobility tasks including donning TLSO.  This date, pt up to ambulate in hall 63' and able to repeat following seated rest break.  Pt very motivated to progress and return home with family assist. Patient will benefit from intensive inpatient follow-up therapy, >3 hours/day.     If plan is discharge home, recommend the following: Two people to help with walking and/or transfers;Two people to help with bathing/dressing/bathroom;Help with stairs or ramp for entrance;Assist for transportation;Assistance with cooking/housework   Can travel by private vehicle     Yes  Equipment Recommendations  None recommended by PT    Recommendations for Other Services       Precautions / Restrictions Precautions Precautions: Fall Restrictions Weight Bearing Restrictions Per Provider Order: No     Mobility  Bed Mobility Overal bed mobility: Needs Assistance Bed Mobility: Rolling, Sidelying to Sit, Sit to Sidelying Rolling: Mod assist Sidelying to sit: Min assist, Mod assist Supine to sit: Mod assist     General bed mobility comments: cues to log roll min cues  and assist with bed pad, pt able to scoot anteriorly toward EOB once in sitting with CGA and min cues    Transfers Overall transfer level: Needs assistance Equipment used: Rolling walker (2 wheels) Transfers: Sit to/from Stand Sit to Stand: Min assist           General transfer comment: TLSO donned seated EOB, min cues and pull to stand to RW    Ambulation/Gait Ambulation/Gait assistance: Contact guard assist (chair follow for safety) Gait Distance (Feet): 75 Feet (75' twice with seated rest break) Assistive device: Rolling walker (2 wheels) Gait Pattern/deviations: Step-through pattern, Decreased stance time - right, Trunk flexed Gait velocity: variable     General Gait Details: pt initally amb with short choppy steps, noted leg length discepancy with pt endorsing L > R slight trunk flexion with TLSO donned, min cues for safety, RW management and pursed lip breathing   Stairs             Wheelchair Mobility     Tilt Bed    Modified Rankin (Stroke Patients Only)       Balance Overall balance assessment: Needs assistance, History of Falls Sitting-balance support: Feet unsupported, No upper extremity supported Sitting balance-Leahy Scale: Fair     Standing balance support: During functional activity, Reliant on assistive device for balance Standing balance-Leahy Scale: Poor Standing balance comment: unsteady without UE and external support                            Communication Communication Communication: No apparent difficulties  Cognition Arousal: Alert Behavior During Therapy: WFL for tasks assessed/performed  PT - Cognitive impairments: No apparent impairments                       PT - Cognition Comments: occasional redirection to task Following commands: Intact      Cueing Cueing Techniques: Verbal cues  Exercises      General Comments        Pertinent Vitals/Pain Pain Assessment Pain Assessment: Faces Faces  Pain Scale: Hurts little more Pain Location: back, generalized Pain Descriptors / Indicators: Grimacing, Guarding Pain Intervention(s): Limited activity within patient's tolerance, Monitored during session, Premedicated before session    Home Living                          Prior Function            PT Goals (current goals can now be found in the care plan section) Acute Rehab PT Goals Patient Stated Goal: home, family feels pt will need rehab PT Goal Formulation: With patient Time For Goal Achievement: 02/25/24 Potential to Achieve Goals: Fair Progress towards PT goals: Progressing toward goals    Frequency    Min 3X/week      PT Plan      Co-evaluation              AM-PAC PT 6 Clicks Mobility   Outcome Measure  Help needed turning from your back to your side while in a flat bed without using bedrails?: A Lot Help needed moving from lying on your back to sitting on the side of a flat bed without using bedrails?: A Lot Help needed moving to and from a bed to a chair (including a wheelchair)?: A Little Help needed standing up from a chair using your arms (e.g., wheelchair or bedside chair)?: A Little Help needed to walk in hospital room?: A Little Help needed climbing 3-5 steps with a railing? : Total 6 Click Score: 14    End of Session Equipment Utilized During Treatment: Gait belt Activity Tolerance: Patient tolerated treatment well Patient left: in bed;with call bell/phone within reach;with bed alarm set Nurse Communication: Mobility status PT Visit Diagnosis: Other abnormalities of gait and mobility (R26.89)     Time: 9079-9054 PT Time Calculation (min) (ACUTE ONLY): 25 min  Charges:    $Gait Training: 8-22 mins $Therapeutic Activity: 8-22 mins PT General Charges $$ ACUTE PT VISIT: 1 Visit                     [facility] PT Acute Rehabilitation Services Office [phone]    [facility] 02/18/2024, 12:53 PM

## 2024-02-18 NOTE — Progress Notes (Signed)
 [facility]  PROGRESS NOTE  Peggy Armstrong FMW:[id number] DOB: Aug 16, 1945 DOA: 02/10/2024 PCP: Iven [name] DASEN, PA-C   Brief HPI:   78 year old woman complex PMH including inoperable duodenal adenocarcinoma, rheumatoid arthritis, CKD, multiple spontaneous vertebral compression fractures, who presented to the Emergency Department for new back pain. Evaluation was notable for profound anemia and CT showed acute L1 compression fracture. She was admitted for transfusion, pain control. GI was consulted. Hemoglobin stabilized, she eventually elected endoscopy which will be performed tomorrow. She refuses SNF and will discharge home with home health when ready.  Procedures/Events 8/16 admit for severe anemia, new L1 compression fx with acute pain 8/20 s/p EGD:  White nummular lesions in esophageal mucosa.                            Brushings performed to rule out Candida. - Multiple inflammatoryappearing gastric polyps.  - Malignant duodenal mass with mild slow oozing taking over the entirety of the duodenum 2. PuraStat injected.     Assessment/Plan:    Acute on chronic anemia, macrocytic, asymptomatic -Severe anemia hemoglobin 5.5.  Transfused 2 units 8/16 with appropriate rise.  Hemoglobin remained stable. -GI consultation appreciated.  Holding Eliquis .   -8/20 s/p EGD: Oozing duodenal malignant mass, pleural State injected by GI.  Patient remains at high risk for rebleeding if DOAC will be restarted.   Duodenal adenocarcinoma -Follow-up with primary oncologist in [place] as an outpatient 8/21 patient was seen by general surgery, recommended no surgical intervention, patient is very poor candidate, recommended radiation therapy.  General surgery will discuss with oncologist Dr. Ezzard and will refer her to radiation oncology.  Patient agreed with this plan -Palliative care consult for goals of care   Shortness of breath and wheezing possible due to acute pulmonary edema/Acute on  chronic diastolic CHF exacerbation -Diurese with Lasix  -TTE LVEF 65%, G1 DD, mild to moderate TR, moderate atrial dilatation.  No any other significant finding       Hypokalemia due to diuresis Replete      Iron  deficiency, Tsat 4%. IV Venofer  300 mg x 1 dose given Continue oral iron  supplement on discharge     Acute L1 compression fracture -Pain control, early ambulation.  -Consult TOC, physical therapy is recommending SNF.  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  -Patient remains at high risk for rebleeding on DOAC.  Would recommend to follow-up with cardiology to restart DOAC when stable - Cardiology following     CKD stage IIIa -Creatinine elevated due to diuresis --Holding Lasix  at this time   Hypophosphatemia - Replete   obesity Body mass index is 38.45 kg/m.     Medications     acetaminophen   650 mg Oral Q6H   Or   acetaminophen   650 mg Rectal Q6H   allopurinol   300 mg Oral Daily   amiodarone   200 mg Oral Daily   arformoterol   15 mcg Nebulization BID   busPIRone   10 mg Oral BID   cyanocobalamin   1,000 mcg Oral Daily   diltiazem   300 mg Oral Daily   DULoxetine   20 mg Oral Daily   levalbuterol   0.63 mg Nebulization TID   levothyroxine   100 mcg Oral QAC breakfast   pantoprazole   40 mg Oral BID   potassium chloride  SA  20 mEq Oral QHS   sodium chloride  flush  3 mL Intravenous Q12H   sodium chloride  flush  3 mL Intravenous Q12H   sucralfate   1 g Oral BID     Data Reviewed:   CBG:     Vitals:   02/18/24 0218 02/18/24 0531 02/18/24 0859 02/18/24 0909  BP: (!) 145/82 123/89 124/83   Pulse: 69 (!) 120 (!) 123    Resp: 18 18 20    Temp: 98.3 F (36.8 C) 97.7 F (36.5 C) 98 F (36.7 C)   TempSrc: Oral Oral Oral   SpO2: 96% 92% 100% 96%  Weight:      Height:          Data Reviewed:  Basic Metabolic Panel: Recent Labs  Lab 02/14/24 1042 02/15/24 0503 02/16/24 0448 02/17/24 0541 02/18/24 0437  NA 138 137 139 140 140  K 4.3 3.6 3.1* 4.0 3.9  CL 107 104 103 102 104  CO2 26 23 28 29 27   GLUCOSE 89 117* 120* 107* 107*  BUN 22 20 28* 37* 40*  CREATININE 1.06* 0.94 0.98 1.27* 1.35*  CALCIUM  8.4* 8.1* 8.3* 8.5* 8.4*  MG 1.8 1.8 1.8 1.8  --   PHOS 3.4 3.5 2.7 2.1* 3.4    CBC: Recent Labs  Lab 02/14/24 1005 02/15/24 0503 02/16/24 0448 02/17/24 0541 02/18/24 0437  WBC 5.6 2.8* 4.4 6.5 5.6  HGB 8.5* 8.5* 8.7* 8.4* 8.4*  HCT 30.7* 30.7* 31.7* 30.0* 30.7*  MCV 100.0 97.5 96.4 95.8 97.8  PLT 100* 114* 172 203 190    LFT No results for input(s): AST, ALT, ALKPHOS, BILITOT, PROT, ALBUMIN in the last 168 hours.    Antibiotics: Anti-infectives (From admission, onward)    Start     Dose/Rate Route Frequency Ordered Stop   02/10/24 1000  cephALEXin  (KEFLEX ) capsule 500 mg        500 mg Oral  Once 02/10/24 0950 02/10/24 1014        DVT prophylaxis: SCDs  Code Status: Full code  Family Communication: Called daughter   CONSULTS    Subjective   Does not want to return to rehab as she was at prior-discussed with daughter patient will not have family support at home 24/7  Objective    Physical Examination:   General: Appearance:    Obese female in no acute distress     Lungs:     C respirations unlabored  Heart:    Tachycardic. irr   MS:   All extremities are intact.   Neurologic:   Awake, alert, oriented x 3. No apparent focal neurological           defect.      Status is: Inpatient:             Peggy Armstrong   [facility] If 7PM-7AM, please contact night-coverage at [email or web address], Office  [phone]   02/18/2024, 10:44 AM   LOS: 7 days

## 2024-02-18 NOTE — Plan of Care (Signed)

## 2024-02-19 DIAGNOSIS — D649 Anemia, unspecified: Secondary | ICD-10-CM | POA: Diagnosis not present

## 2024-02-19 LAB — BASIC METABOLIC PANEL WITH GFR
Anion gap: 7 (ref 5–15)
BUN: 38 mg/dL — ABNORMAL HIGH (ref 8–23)
CO2: 25 mmol/L (ref 22–32)
Calcium: 8 mg/dL — ABNORMAL LOW (ref 8.9–10.3)
Chloride: 107 mmol/L (ref 98–111)
Creatinine, Ser: 1.43 mg/dL — ABNORMAL HIGH (ref 0.44–1.00)
GFR, Estimated: 38 mL/min — ABNORMAL LOW (ref >60)
Glucose, Bld: 78 mg/dL (ref 70–99)
Potassium: 3.8 mmol/L (ref 3.5–5.1)
Sodium: 139 mmol/L (ref 135–145)

## 2024-02-19 LAB — CBC
HCT: 30 % — ABNORMAL LOW (ref 36.0–46.0)
Hemoglobin: 8.4 g/dL — ABNORMAL LOW (ref 12.0–15.0)
MCH: 27.6 pg (ref 26.0–34.0)
MCHC: 28 g/dL — ABNORMAL LOW (ref 30.0–36.0)
MCV: 98.7 fL (ref 80.0–100.0)
Platelets: 170 K/uL (ref 150–400)
RBC: 3.04 MIL/uL — ABNORMAL LOW (ref 3.87–5.11)
RDW: 18.2 % — ABNORMAL HIGH (ref 11.5–15.5)
WBC: 5.9 K/uL (ref 4.0–10.5)
nRBC: 0 % (ref 0.0–0.2)

## 2024-02-19 MED ORDER — FUROSEMIDE 10 MG/ML IJ SOLN
40.0000 mg | Freq: Once | INTRAMUSCULAR | Status: AC
  Administered 2024-02-19: 40 mg via INTRAVENOUS
  Filled 2024-02-19: qty 4

## 2024-02-19 MED ORDER — AMIODARONE HCL 200 MG PO TABS
200.0000 mg | ORAL_TABLET | Freq: Two times a day (BID) | ORAL | Status: DC
  Administered 2024-02-19 – 2024-02-25 (×12): 200 mg via ORAL
  Filled 2024-02-19 (×12): qty 1

## 2024-02-19 MED ORDER — MELATONIN 5 MG PO TABS
5.0000 mg | ORAL_TABLET | Freq: Every evening | ORAL | Status: DC | PRN
  Administered 2024-02-19: 5 mg via ORAL
  Filled 2024-02-19: qty 1

## 2024-02-19 NOTE — Progress Notes (Signed)
 Daily Progress Note   Patient Name: Peggy Armstrong       Date: 02/19/2024 DOB: Mar 13, 1946  Age: 78 y.o. MRN#: [id number] Attending Physician: Juvenal Harlene PENNER, DO Primary Care Physician: Rothfuss, Jacob T, PA-C Admit Date: 02/10/2024  Reason for Consultation/Follow-up: Establishing goals of care  Subjective: Awake alert No distress  Length of Stay: 8  Current Medications: Scheduled Meds:   acetaminophen   650 mg Oral Q6H   Or   acetaminophen   650 mg Rectal Q6H   allopurinol   300 mg Oral Daily   amiodarone   200 mg Oral Daily   arformoterol   15 mcg Nebulization BID   busPIRone   10 mg Oral BID   cyanocobalamin   1,000 mcg Oral Daily   diltiazem   300 mg Oral Daily   DULoxetine   20 mg Oral Daily   levalbuterol   0.63 mg Nebulization TID   levothyroxine   100 mcg Oral QAC breakfast   pantoprazole   40 mg Oral BID   potassium chloride  SA  20 mEq Oral QHS   sodium chloride  flush  3 mL Intravenous Q12H   sodium chloride  flush  3 mL Intravenous Q12H   sucralfate   1 g Oral BID    Continuous Infusions:   PRN Meds: diphenhydrAMINE , HYDROmorphone  (DILAUDID ) injection, methocarbamol , ondansetron  **OR** ondansetron  (ZOFRAN ) IV, oxyCODONE , sodium chloride  flush  Physical Exam         Awake alert No distress currently Appears with generalized weakness  Vital Signs: BP 135/65 (BP Location: Right Arm)   Pulse 63   Temp 98.5 F (36.9 C) (Oral)   Resp 18   Ht 5' (1.524 m)   Wt 89.3 kg   SpO2 95%   BMI 38.45 kg/m  SpO2: SpO2: 95 % O2 Device: O2 Device: Room Air O2 Flow Rate: O2 Flow Rate (L/min): 2 L/min  Intake/output summary:  Intake/Output Summary (Last 24 hours) at 02/19/2024 1200 Last data filed at 02/19/2024 0600 Gross per 24 hour  Intake 840 ml  Output 850 ml  Net -10 ml    LBM: Last BM Date : 02/15/24 Baseline Weight: Weight: 89.3 kg Most recent weight: Weight: 89.3 kg       Palliative Assessment/Data:      Patient Active Problem List   Diagnosis Date Noted   Anemia due to blood loss, acute 02/15/2024  IDA (iron  deficiency anemia) 02/15/2024   Anemia 02/14/2024   Mucosal abnormality of esophagus 02/14/2024   Chronic anticoagulation 02/12/2024   Acute on chronic anemia 02/12/2024   Severe anemia 02/10/2024   Closed compression fracture of body of L1 vertebra (HCC) 02/10/2024   Lumbar radiculopathy 12/15/2023   Acquired hypothyroidism    Fall at home, initial encounter 10/21/2023   Left humeral fracture 10/21/2023   Left elbow avulsion fracture 10/21/2023   History of GI bleed 10/21/2023   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   History of depression 10/21/2023   Atrial fibrillation (HCC)    Back injury    DVT (deep venous thrombosis) (HCC)    Hypertension    Cellulitis of left arm 08/14/2023   Hemorrhagic shock (HCC) 07/21/2023   ABLA (acute blood loss anemia) 07/21/2023   Acute GI bleeding 07/21/2023   AKI (acute kidney injury) (HCC) 07/21/2023   GI bleed 07/20/2023   Chronic low back pain 07/18/2023   Upper GI bleed 06/20/2023   Long term current use of anticoagulant therapy 06/20/2023   Occult blood in stools 05/16/2023   Gastric polyps 05/16/2023   Generalized weakness 05/12/2023   Acute cystitis 05/12/2023   Dehydration 05/12/2023   Acute prerenal azotemia 05/12/2023   Paroxysmal atrial fibrillation (HCC) 05/12/2023   AAA (abdominal aortic aneurysm) (HCC) 05/12/2023   Hyperlipidemia 05/12/2023   GAD (generalized anxiety disorder) 05/12/2023   Hypothyroidism 05/12/2023   Closed compression fracture of L2 lumbar vertebra, initial encounter (HCC) 05/11/2023   Restless leg 12/02/2022   Routine general medical examination at a health care facility 12/13/2019   Chronic diastolic CHF (congestive heart failure) (HCC)  11/08/2019   Morbid obesity (HCC) 11/08/2019   History of DVT (deep vein thrombosis) 11/08/2019   Need for immunization against influenza 07/18/2019   Right shoulder pain 07/26/2018   Need for vaccination 04/23/2018   Duodenal ulcer    Hematemesis 11/05/2017   Macrocytic anemia 11/05/2017   Melena 11/05/2017   Nausea & vomiting    Hypokalemia 07/03/2017   Essential hypertension    Gout    Clotting disorder (HCC)    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   CKD (chronic kidney disease) stage 3, GFR 30-59 ml/min (HCC) 06/09/2017   CKD (chronic kidney disease), stage III (HCC) 06/09/2017   S/P ERCP 05/05/2017   Peri-ampullary neoplasm    Atypical atrial flutter (HCC)    Duodenal mass    Cholangitis    Epigastric pain    Choledocholithiasis 03/22/2017   SIRS (systemic inflammatory response syndrome) (HCC) 03/22/2017   Poor dentition 08/14/2016   Iron  deficiency anemia 08/02/2016   RA (rheumatoid arthritis) (HCC) 09/07/2015    Palliative Care Assessment & Plan   Patient Profile:    Assessment:  78 year old woman complex PMH including inoperable duodenal adenocarcinoma, rheumatoid arthritis, CKD, multiple spontaneous vertebral compression fractures, who presented to the Emergency Department for new back pain. Evaluation was notable for profound anemia and CT showed acute L1 compression fracture. She was admitted for transfusion, pain control.  8/20 s/p EGD:  White nummular lesions in esophageal mucosa.                            Brushings performed to rule out Candida. - Multiple inflammatoryappearing gastric polyps.  - Malignant duodenal mass with mild slow oozing taking over the entirety of the duodenum 2. PuraStat injected.  Recommendations/Plan:  Discussed with TOC, patient wishes for full code full scope  care for now, wants to go back to her oncologist in [place] and discuss further GOC, does not want SNF rehab, wants home PT, recommend community based palliative care  for additional support.  8-23: I encouraged the patient to discuss frankly with her oncologist on her next appointment at Aug 29 with regards to her prognosis. Her daughter Nereida Schepp is her HCPOA.  8-25: CIR evaluation pending, Palliative Medicine Team at [facility] can follow her if she goes to CIR, otherwise, recommend care connections outpatient palliative team.   Goals of Care and Additional Recommendations: Limitations on Scope of Treatment: Full Scope Treatment  Code Status:    Code Status Orders  (From admission, onward)           Start     Ordered   02/10/24 1612  Full code  Continuous       Question:  By:  Answer:  Consent: discussion documented in EHR   02/10/24 1612           Code Status History     Date Active Date Inactive Code Status Order ID Comments User Context   10/21/2023 2353 10/25/2023 1845 Full Code [id number]  Lee Kingfisher, MD ED   07/20/2023 1733 07/27/2023 1930 Full Code [id number]  Cheryle Page, MD ED   06/20/2023 0756 06/21/2023 1724 Full Code [id number]  Zella Katha HERO, MD ED   05/11/2023 2325 05/18/2023 2000 Full Code [id number]  Marcene Eva NOVAK, DO ED   11/05/2017 0127 11/09/2017 2147 Full Code [id number]  [name] Agent, MD ED   03/22/2017 2340 03/30/2017 2110 Full Code [id number]  Franky Redia SAILOR, MD ED       Prognosis:  Unable to determine  Discharge Planning: CIR eval pending.   Care plan was discussed with   IDT  Thank you for allowing the Palliative Medicine Team to assist in the care of this patient.  Low MDM     Greater than 50%  of this time was spent counseling and coordinating care related to the above assessment and plan.  Lonia Serve, MD  Please contact Palliative Medicine Team phone at [phone] for questions and concerns.

## 2024-02-19 NOTE — TOC Progression Note (Addendum)
 Transition of Care [facility]) - Progression Note    Patient Details  Name: Peggy Armstrong MRN: [id number] Date of Birth: 1946-02-11  [facility] CM/SW Contact  [name], RN Phone Number: 02/19/2024, 8:53 AM  Clinical Narrative: PT recommendation changed to Acute Inpatient Rehab, await evaluation.     -3:41pm CIR eval completed, patient would benefit from acute inpatient rehab. TOC will follow for assist with transfer.     Expected Discharge Plan: Home w Home Health Services Barriers to Discharge: Continued Medical Work up               Expected Discharge Plan and Services       Living arrangements for the past 2 months: Apartment                                       Social Drivers of Health (SDOH) Interventions SDOH Screenings   Food Insecurity: No Food Insecurity (02/10/2024)  Housing: Low Risk  (02/10/2024)  Transportation Needs: No Transportation Needs (02/10/2024)  Utilities: Not At Risk (02/10/2024)  Depression (PHQ2-9): Low Risk  (12/22/2023)  Financial Resource Strain: Low Risk  (12/02/2022)   Received from [facility]  Physical Activity: Unknown (04/05/2022)   Received from [facility]  Social Connections: Moderately Isolated (02/10/2024)  Stress: No Stress Concern Present (04/05/2022)   Received from [facility]  Tobacco Use: Medium Risk (02/14/2024)    Readmission Risk Interventions    02/12/2024    2:43 PM 07/24/2023    5:23 PM 05/14/2023    6:44 PM  Readmission Risk Prevention Plan  Transportation Screening Complete Complete Complete  PCP or Specialist Appt within 3-5 Days Complete Complete Complete  HRI or Home Care Consult Complete Complete Complete  Social Work Consult for Recovery Care Planning/Counseling  Complete Complete  Palliative Care Screening Not Applicable Not Applicable Complete  Medication Review Oceanographer) Complete Referral to Pharmacy Referral to Pharmacy

## 2024-02-19 NOTE — Progress Notes (Addendum)
 Progress Note  Patient Name: Peggy Armstrong Date of Encounter: 02/19/2024 [facility] Cardiologist: Alean SAUNDERS Madireddy, MD   Interval Summary   Reports feeling much improved today.  Shortness of breath has been improving after getting breathing treatment this morning and dose of Lasix .  Creatinine is worsening now.  Vital Signs Vitals:   02/18/24 1734 02/18/24 2135 02/19/24 0600 02/19/24 0827  BP: 138/68 132/67 135/65   Pulse: 65 65 63   Resp: 18  18   Temp: (!) 97.5 F (36.4 C) 98.2 F (36.8 C) 98.5 F (36.9 C)   TempSrc: Oral Oral Oral   SpO2: 94% 97% 92% 95%  Weight:      Height:        Intake/Output Summary (Last 24 hours) at 02/19/2024 1051 Last data filed at 02/19/2024 0600 Gross per 24 hour  Intake 840 ml  Output 850 ml  Net -10 ml      02/10/2024    3:06 PM 02/01/2024    4:03 PM 12/27/2023   12:10 PM  Last 3 Weights  Weight (lbs) 196 lb 13.9 oz 197 lb 186 lb  Weight (kg) 89.3 kg 89.359 kg 84.369 kg      Telemetry/ECG  Likely slow flutter heart rates in the 60s- Personally Reviewed  Physical Exam  GEN: No acute distress.   Neck: No JVD Cardiac: RRR, no murmurs, rubs, or gallops.  Respiratory: Clear to auscultation bilaterally, +wheezes GI: Soft, nontender, non-distended  MS: No edema  Patient Profile Patient with past medical history significant for atypical atrial flutter/PAF, chronic HFpEF, hypertension, history of recurrent GI bleeds, DVT 2017, adenocarcinoma status post embolectomy 2019, hypothyroidism, anemia.  Initially presented for back pain showing acute L1 compression fracture, found to be profoundly anemic with hemoglobin of 5.5.  Cardiology asked to see for atrial flutter/HFpEF.  Assessment & Plan   Chronic HFpEF Overall appears to be euvolemic, she has had some shortness of breath but very wheezing, improved after breathing treatment and lasix . Likely component of pulm issues as well. CXR yesterday with small bilat pleural  effusions though. She was given a dose of IV Lasix  today but has worsening creatinine levels this admission appears euvolemic.  Would not diurese any further. Tomorrow could resume back her PO lasix  20mg  if Cr improved.  She is getting more benefit from breathing treatments.  Paroxysmal atrial flutter/fibrillation Has had variable conduction, currently looks to be in slow flutter heart rates in the 60s. 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  Here with acute anemia requiring blood transfusion.  DOAC has been held, will defer to her primary cardiologist and oncology to make final decisions for long terms plans of anticoagulation. Has recurrent issues of GI bleed so likely will be stopped. Continue with amiodarone  200 mg daily, diltiazem  300 mg Not a candidate for watchman's with overall poor prognosis.  Acute anemia Duodenal adenocarcinoma  Hemoglobin 5.5.  Transfused 2 units 8/16. Stable now. EGD 8/20 showing oozing duodenal malignant mass.  Palliative care following.    For questions or updates, please contact [facility] Please consult [email or web address] for contact info under       Signed, Thom LITTIE Sluder, PA-C     I have seen and examined the patient along with Thom LITTIE Sluder, PA-C .  I have reviewed the chart, notes and new data.  I agree with PA/NP's note.  Key new complaints: Unaware of arrhythmia Key examination changes: regular tachycardia, otw normal  CV exam Key new findings / data: Just went back into a narrow complex tachycardia at 12 o'clock, after being in NSR. Seems to be ectopic atrial tachycardia or maybe an atrial reentry tachycardia, that starts and stops repeatedly. It is not atrial flutter or atrial fibrillation.    PLAN: Continue amiodarone  (increase to 200 mg twice daily) and diltiazem . DC IV diuretics. Continue her home PO diuretic dose. Keep off anticoagulation, very unlikely we will restart it.  Aaliyah Gavel, MD, FACC [facility] [phone] 02/19/2024, 12:37 PM

## 2024-02-19 NOTE — Evaluation (Signed)
 Occupational Therapy Evaluation Patient Details Name: Peggy Armstrong MRN: [id number] DOB: 1946/02/06 Today's Date: 02/19/2024   History of Present Illness   78 y.o. female who presented to the emergency department 02/10/24 with profound fatigue and back pain. Pt found to have hemoglobin 5.5 and positive fecal occult blood. EGD on 8/20. CT= Acute L1 compression fracture.  2. Mild L3 compression fracture with progressive height loss since  10/20/2023.  3. 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 GI consulted. Pt with extensive medical history including but not limited to congestive heart failure, A-fib on Eliquis , AAA, HTN, chronic back pain, history of DVT, CKD and ampullary adenocarcinoma, L humerus fx, RA, L elbow avulsion fx, LLE fx     Clinical Impressions Pt seen today for OT evaluation. Prior to admission, pt required assist for LB ADLs/IADL performance from daughter and granddaughter. Pt was mod independent for transfers to/from manual wheelchair, uses RW for short distances mobility when she has standby assistance from family. Pt presents with generalized weakness, pain, decreased tolerance to activity, impaired balance and coordination which impacts safe, efficient ADL performance.  Pt requires MIN A for rolling using log roll technique, MAX A to transition from side-lying to sitting, MAX A to don TLSO in seated, MIN - MOD A using RW for functional STS and step pivot transfers, and anticipate MAX A for UB/LB ADL performance. Pt would benefit from skilled OT services to address noted impairments and functional limitations (see below for any additional details) in order to maximize safety and independence while minimizing falls risk and caregiver burden. Anticipate the need for follow up OT services upon acute hospital DC. Patient will benefit from intensive inpatient follow-up therapy, >3 hours/day.     If plan is discharge home, recommend the  following:   A lot of help with walking and/or transfers;A lot of help with bathing/dressing/bathroom;Assistance with cooking/housework;Assist for transportation;Help with stairs or ramp for entrance     Functional Status Assessment   Patient has had a recent decline in their functional status and demonstrates the ability to make significant improvements in function in a reasonable and predictable amount of time.     Equipment Recommendations   None recommended by OT     Recommendations for Other Services   Rehab consult     Precautions/Restrictions   Precautions Precautions: Fall;Back Recall of Precautions/Restrictions: Impaired Precaution/Restrictions Comments: recalls 2/3 spinal precautions start of session; cannot verbalize how to functionally implement during LB ADL performance Required Braces or Orthoses: Spinal Brace Spinal Brace: Thoracolumbosacral orthotic;Applied in sitting position Restrictions Weight Bearing Restrictions Per Provider Order: No     Mobility Bed Mobility Overal bed mobility: Needs Assistance Bed Mobility: Rolling, Sidelying to Sit Rolling: Min assist, Used rails Sidelying to sit: Max assist, +2 for safety/equipment, +2 for physical assistance, HOB elevated, Used rails       General bed mobility comments: cues for logroll technique, pt able to roll with minA, MAX required to successfully transition to seated. TLSO donned EOB.    Transfers Overall transfer level: Needs assistance Equipment used: Rolling walker (2 wheels) Transfers: Sit to/from Stand Sit to Stand: Min assist     Step pivot transfers: Min assist     General transfer comment: cues for hand placement and safety      Balance Overall balance assessment: Needs assistance, History of Falls Sitting-balance support: Feet supported, Bilateral upper extremity supported Sitting balance-Leahy Scale: Fair     Standing balance support: During functional  activity, Reliant on  assistive device for balance Standing balance-Leahy Scale: Poor Standing balance comment: unsteady without UE and external support                           ADL either performed or assessed with clinical judgement   ADL Overall ADL's : Needs assistance/impaired Eating/Feeding: Independent;Sitting   Grooming: Sitting;Set up   Upper Body Bathing: Sitting;Maximal assistance   Lower Body Bathing: Maximal assistance;Sit to/from stand;Cueing for back precautions;Cueing for sequencing;Cueing for safety;With adaptive equipment   Upper Body Dressing : Sitting;Maximal assistance Upper Body Dressing Details (indicate cue type and reason): maxA to don TLSO seated Lower Body Dressing: Maximal assistance;Sit to/from stand;Cueing for back precautions;Cueing for sequencing;Cueing for safety;With adaptive equipment;Sitting/lateral leans   Toilet Transfer: Minimal assistance;Cueing for safety;Cueing for sequencing;Ambulation;BSC/3in1;Rolling walker (2 wheels);Moderate assistance Toilet Transfer Details (indicate cue type and reason): clinical judgement, pt will require hands on assist for safety during all transfers Toileting- Clothing Manipulation and Hygiene: Maximal assistance;Cueing for safety;Cueing for sequencing;Cueing for back precautions;Cueing for compensatory techniques;Sit to/from stand;Sitting/lateral lean       Functional mobility during ADLs: Minimal assistance;Moderate assistance;Cueing for sequencing;Cueing for safety;Rolling walker (2 wheels) General ADL Comments: pt will require continued reinforcement of spinal precautions during all ADL task performance, unsteady during mobility requiring chair follow for safety     Vision Baseline Vision/History: 1 Wears glasses Ability to See in Adequate Light: 0 Adequate              Pertinent Vitals/Pain Pain Assessment Pain Assessment: Faces Faces Pain Scale: Hurts a little bit Pain Location: back, generalized Pain  Descriptors / Indicators: Grimacing, Guarding Pain Intervention(s): Limited activity within patient's tolerance, Monitored during session, Repositioned     Extremity/Trunk Assessment Upper Extremity Assessment Upper Extremity Assessment: LUE deficits/detail;Right hand dominant;RUE deficits/detail (hx of L humeral head fx in April 2024, decreased ROM. Per follow up note in May, no UE restrictions.) RUE Deficits / Details: generalized weakness RUE Coordination: decreased gross motor;decreased fine motor LUE Coordination: decreased gross motor;decreased fine motor   Lower Extremity Assessment Lower Extremity Assessment: Defer to PT evaluation (hx of L greater trochanter fx (April 2025))   Cervical / Trunk Assessment Cervical / Trunk Assessment: Kyphotic   Communication Communication Communication: No apparent difficulties   Cognition Arousal: Alert Behavior During Therapy: WFL for tasks assessed/performed Cognition: No apparent impairments                               Following commands: Intact       Cueing  General Comments   Cueing Techniques: Verbal cues  HR WNL, pt with dyspnea on exertion, O2 sats 98% after mobility           Home Living Family/patient expects to be discharged to:: Private residence Living Arrangements: Children Available Help at Discharge: Family;Available 24 hours/day Type of Home: Apartment Home Access: Stairs to enter [employer] of Steps: flight Entrance Stairs-Rails: Right;Left Home Layout: One level     Bathroom Shower/Tub: Chief Strategy Officer: Standard Bathroom Accessibility: Yes How Accessible: Accessible via walker;Accessible via wheelchair Home Equipment: Grab bars - tub/shower;Rolling Walker (2 wheels);Cane - single point;Tub bench;BSC/3in1;Wheelchair - manual   Additional Comments: on wait list for ~ 1 yr for main level apt per pt grand dtr; grand-dtr is a Runner, broadcasting/film/video and is about to go back to  school (she has been assisting), pt  dtr is on chemo and cannot assist on her tx weeks  Lives With: Daughter    Prior Functioning/Environment Prior Level of Function : Needs assist             Mobility Comments: pt reports she was able to transfer after rehab without assist; recently requiring incr assist. uses wheelchair in apartment ADLs Comments: requires MIN - CGA A for all ADLs, assist for all IADLs.    OT Problem List: Decreased strength;Decreased range of motion;Decreased activity tolerance;Impaired balance (sitting and/or standing);Obesity;Pain;Impaired UE functional use;Decreased knowledge of precautions;Decreased knowledge of use of DME or AE;Decreased safety awareness   OT Treatment/Interventions: Self-care/ADL training;Therapeutic exercise;Neuromuscular education;Energy conservation;DME and/or AE instruction;Therapeutic activities;Patient/family education;Balance training      OT Goals(Current goals can be found in the care plan section)   Acute Rehab OT Goals Patient Stated Goal: to go to [facility] rehab OT Goal Formulation: With patient Time For Goal Achievement: 03/04/24 Potential to Achieve Goals: Good   OT Frequency:  Min 2X/week    Co-evaluation              AM-PAC OT 6 Clicks Daily Activity     Outcome Measure Help from another person eating meals?: None Help from another person taking care of personal grooming?: A Little Help from another person toileting, which includes using toliet, bedpan, or urinal?: A Lot Help from another person bathing (including washing, rinsing, drying)?: A Lot Help from another person to put on and taking off regular upper body clothing?: A Lot Help from another person to put on and taking off regular lower body clothing?: A Lot 6 Click Score: 15   End of Session Equipment Utilized During Treatment: Gait belt;Rolling walker (2 wheels);Back brace Nurse Communication: Mobility status;Precautions  Activity Tolerance: Patient  tolerated treatment well;No increased pain Patient left: in chair;with call bell/phone within reach;with chair alarm set  OT Visit Diagnosis: Muscle weakness (generalized) (M62.81);History of falling (Z91.81);Pain;Unsteadiness on feet (R26.81)                Time: 8461-8394 OT Time Calculation (min): 27 min Charges:  OT General Charges $OT Visit: 1 Visit OT Evaluation $OT Eval Moderate Complexity: 1 Mod  Rubi Tooley L. Mechelle Pates, OTR/L  02/19/24, 4:45 PM

## 2024-02-19 NOTE — Progress Notes (Addendum)
 [facility]  PROGRESS NOTE  Peggy Armstrong FMW:[id number] DOB: 1946/05/26 DOA: 02/10/2024 PCP: Iven [name] DASEN, PA-C   Brief HPI:   78 year old woman complex PMH including inoperable duodenal adenocarcinoma, rheumatoid arthritis, CKD, multiple spontaneous vertebral compression fractures, who presented to the Emergency Department for new back pain. Evaluation was notable for profound anemia and CT showed acute L1 compression fracture. She was admitted for transfusion, pain control. GI was consulted. Hemoglobin stabilized, she eventually elected endoscopy which will be performed tomorrow. She refuses SNF and will discharge home with home health when ready.  Procedures/Events 8/16 admit for severe anemia, new L1 compression fx with acute pain 8/20 s/p EGD:  White nummular lesions in esophageal mucosa.                            Brushings performed to rule out Candida. - Multiple inflammatoryappearing gastric polyps.  - Malignant duodenal mass with mild slow oozing taking over the entirety of the duodenum 2. PuraStat injected.     Assessment/Plan:    Acute on chronic anemia, macrocytic, asymptomatic -Severe anemia hemoglobin 5.5.  Transfused 2 units 8/16 with appropriate rise.  Hemoglobin remained stable. -GI consultation appreciated.  Holding Eliquis .   -8/20 s/p EGD: Oozing duodenal malignant mass, pleural State injected by GI.  Patient remains at high risk for rebleeding if DOAC will be restarted.   Duodenal adenocarcinoma -Follow-up with primary oncologist in [place] as an outpatient 8/21 patient was seen by general surgery, recommended no surgical intervention, patient is very poor candidate, recommended radiation therapy.  General surgery will discuss with oncologist Dr. Ezzard and will refer her to radiation oncology.  Patient agreed with this plan -Palliative care consult for goals of care   Shortness of breath and wheezing possible due to acute pulmonary edema/Acute on  chronic diastolic CHF exacerbation -Diurese with Lasix  -TTE LVEF 65%, G1 DD, mild to moderate TR, moderate atrial dilatation.  No any other significant finding       Hypokalemia due to diuresis Replete      Iron  deficiency, Tsat 4%. IV Venofer  300 mg x 1 dose given Continue oral iron  supplement on discharge     Acute L1 compression fracture -Pain control, early ambulation.  -Consult TOC, physical therapy is recommending SNF.  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  -Patient remains at high risk for rebleeding on DOAC.  Would recommend to follow-up with cardiology to restart DOAC when stable - Cardiology following     CKD stage IIIa -Creatinine elevated due to diuresis --retrial of IV lasix    Hypophosphatemia - Replete   obesity Body mass index is 38.45 kg/m.     Medications     acetaminophen   650 mg Oral Q6H   Or   acetaminophen   650 mg Rectal Q6H   allopurinol   300 mg Oral Daily   amiodarone   200 mg Oral Daily   arformoterol   15 mcg Nebulization BID   busPIRone   10 mg Oral BID   cyanocobalamin   1,000 mcg Oral Daily   diltiazem   300 mg Oral Daily   DULoxetine   20 mg Oral Daily   levalbuterol   0.63 mg Nebulization TID   levothyroxine   100 mcg Oral QAC breakfast   pantoprazole   40 mg Oral BID   potassium chloride  SA  20 mEq Oral QHS   sodium chloride  flush  3 mL Intravenous Q12H   sodium chloride  flush  3 mL Intravenous Q12H   sucralfate   1 g Oral BID     Data Reviewed:   CBG:     Vitals:   02/18/24 1734 02/18/24 2135 02/19/24 0600 02/19/24 0827  BP: 138/68 132/67 135/65   Pulse: 65 65 63   Resp: 18  18    Temp: (!) 97.5 F (36.4 C) 98.2 F (36.8 C) 98.5 F (36.9 C)   TempSrc: Oral Oral Oral   SpO2: 94% 97% 92% 95%  Weight:      Height:          Data Reviewed:  Basic Metabolic Panel: Recent Labs  Lab 02/14/24 1042 02/15/24 0503 02/16/24 0448 02/17/24 0541 02/18/24 0437 02/19/24 0516  NA 138 137 139 140 140 139  K 4.3 3.6 3.1* 4.0 3.9 3.8  CL 107 104 103 102 104 107  CO2 26 23 28 29 27 25   GLUCOSE 89 117* 120* 107* 107* 78  BUN 22 20 28* 37* 40* 38*  CREATININE 1.06* 0.94 0.98 1.27* 1.35* 1.43*  CALCIUM  8.4* 8.1* 8.3* 8.5* 8.4* 8.0*  MG 1.8 1.8 1.8 1.8  --   --   PHOS 3.4 3.5 2.7 2.1* 3.4  --     CBC: Recent Labs  Lab 02/15/24 0503 02/16/24 0448 02/17/24 0541 02/18/24 0437 02/19/24 0516  WBC 2.8* 4.4 6.5 5.6 5.9  HGB 8.5* 8.7* 8.4* 8.4* 8.4*  HCT 30.7* 31.7* 30.0* 30.7* 30.0*  MCV 97.5 96.4 95.8 97.8 98.7  PLT 114* 172 203 190 170    LFT No results for input(s): AST, ALT, ALKPHOS, BILITOT, PROT, ALBUMIN in the last 168 hours.    Antibiotics: Anti-infectives (From admission, onward)    Start     Dose/Rate Route Frequency Ordered Stop   02/10/24 1000  cephALEXin  (KEFLEX ) capsule 500 mg        500 mg Oral  Once 02/10/24 0950 02/10/24 1014        DVT prophylaxis: SCDs  Code Status: Full code  Family Communication: Called daughter   CONSULTS    Subjective   Agreeable for rehab, some volume overload  Objective    Physical Examination:   General: Appearance:    Obese female in no acute distress     Lungs:    Wheezing, coarse breath sounds  Heart:    Normal heart rate. irr   MS:   All extremities are intact.   Neurologic:   Awake, alert, oriented x 3. No apparent focal neurological           defect.     Status is: Inpatient:             Peggy Armstrong   [facility] If 7PM-7AM, please contact night-coverage at [email or web address], Office  [phone]   02/19/2024, 12:15 PM  LOS: 8 days

## 2024-02-19 NOTE — Plan of Care (Signed)
  Problem: Coping: Goal: Level of anxiety will decrease Outcome: Progressing   Problem: Skin Integrity: Goal: Risk for impaired skin integrity will decrease Outcome: Progressing   

## 2024-02-19 NOTE — Progress Notes (Signed)
 Physical Therapy Treatment Patient Details Name: Peggy Armstrong MRN: [id number] DOB: 20-Jun-1946 Today's Date: 02/19/2024   History of Present Illness 78 y.o. female who presented to the emergency department 02/10/24 with profound fatigue and back pain. Pt found to have hemoglobin 5.5 and positive fecal occult blood. EGD on 8/20. CT= Acute L1 compression fracture.  2. Mild L3 compression fracture with progressive height loss since  10/20/2023.  3. 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 GI consulted. Pt with extensive medical history including but not limited to congestive heart failure, A-fib on Eliquis , AAA, HTN, chronic back pain, history of DVT, CKD and ampullary adenocarcinoma, L humerus fx, RA, L elbow avulsion fx, LLE fx    PT Comments  Pt motivated and making gradual progress.  Needing min-mod A for transfers and chair follow with gait due to easily fatigued.  Needs cues for transfer techniques, back precautions, and gait quality/safety.  Continue POC.  Recommend Patient will benefit from intensive inpatient follow-up therapy, >3 hours/day at d/c.     If plan is discharge home, recommend the following: Help with stairs or ramp for entrance;Assist for transportation;Assistance with cooking/housework;A lot of help with walking and/or transfers;A lot of help with bathing/dressing/bathroom   Can travel by private vehicle     Yes  Equipment Recommendations  None recommended by PT    Recommendations for Other Services       Precautions / Restrictions Precautions Precautions: Fall;Back Required Braces or Orthoses: Spinal Brace Spinal Brace: Thoracolumbosacral orthotic;Applied in sitting position     Mobility  Bed Mobility               General bed mobility comments: At EOB with OT at arrival    Transfers Overall transfer level: Needs assistance Equipment used: Rolling walker (2 wheels) Transfers: Sit to/from Stand Sit to Stand:  Min assist           General transfer comment: STS x 3 during session with cues for hand placement and safety when backing to chair    Ambulation/Gait Ambulation/Gait assistance: Min assist Gait Distance (Feet): 40 Feet (40'x2) Assistive device: Rolling walker (2 wheels) Gait Pattern/deviations: Step-to pattern, Decreased stride length, Decreased dorsiflexion - right, Decreased dorsiflexion - left, Trunk flexed Gait velocity: variable     General Gait Details: Cues for posture and heel-toe pattern.  Good improvement with repeated cues for heel toe.  Also, cues for small steps and staying with RW with turning.  Started with short choppy steps but improved after a few feet and cues for heel to toe   Stairs             Wheelchair Mobility     Tilt Bed    Modified Rankin (Stroke Patients Only)       Balance Overall balance assessment: Needs assistance, History of Falls Sitting-balance support: Feet unsupported, No upper extremity supported Sitting balance-Leahy Scale: Fair     Standing balance support: During functional activity, Reliant on assistive device for balance Standing balance-Leahy Scale: Poor                              Communication    Cognition Arousal: Alert Behavior During Therapy: WFL for tasks assessed/performed   PT - Cognitive impairments: No apparent impairments                       PT - Cognition Comments:  occasional redirection to task        Cueing    Exercises      General Comments General comments (skin integrity, edema, etc.): HR 80's with activity.  Pt noted to have dyspnea but sats 98%      Pertinent Vitals/Pain Pain Assessment Pain Assessment: Faces Faces Pain Scale: Hurts a little bit Pain Location: back, generalized Pain Descriptors / Indicators: Grimacing, Guarding Pain Intervention(s): Limited activity within patient's tolerance, Monitored during session, Premedicated before session,  Repositioned    Home Living     Available Help at Discharge: Family;Available 24 hours/day                    Prior Function            PT Goals (current goals can now be found in the care plan section) Progress towards PT goals: Progressing toward goals    Frequency    Min 3X/week      PT Plan      Co-evaluation              AM-PAC PT 6 Clicks Mobility   Outcome Measure  Help needed turning from your back to your side while in a flat bed without using bedrails?: A Lot Help needed moving from lying on your back to sitting on the side of a flat bed without using bedrails?: A Lot Help needed moving to and from a bed to a chair (including a wheelchair)?: A Little Help needed standing up from a chair using your arms (e.g., wheelchair or bedside chair)?: A Little Help needed to walk in hospital room?: A Little Help needed climbing 3-5 steps with a railing? : Total 6 Click Score: 14    End of Session Equipment Utilized During Treatment: Gait belt Activity Tolerance: Patient tolerated treatment well Patient left: in chair;with chair alarm set;with call bell/phone within reach Nurse Communication: Mobility status PT Visit Diagnosis: Other abnormalities of gait and mobility (R26.89)     Time: 1550-1606 (dovetail with OT) PT Time Calculation (min) (ACUTE ONLY): 16 min  Charges:    $Gait Training: 8-22 mins PT General Charges $$ ACUTE PT VISIT: 1 Visit                     Peggy, PT Acute Rehab Services [facility] [phone]    Peggy Armstrong 02/19/2024, 4:18 PM

## 2024-02-19 NOTE — Progress Notes (Addendum)
 Inpatient Rehab Admissions:  Inpatient Rehab Consult received.  I met with patient on the telephone for rehabilitation assessment and to discuss goals and expectations of an inpatient rehab admission.  Discussed average length of stay, insurance authorization requirement and discharge home after completion of CIR. Pt acknowledged understanding and is interested in pursuing CIR. Pt gave permission to contact daughter [name]. Called [facility] and left a message. Awaiting return call. Will continue to follow.  1418: Spoke with pt's daughter [name] on the telephone. She acknowledged understanding of CIR goals and expectations. She is supportive of pt pursuing CIR. She confirmed that she and her daughters will be able to provide support for pt after discharge.   Signed: Tinnie Yvone Cohens, MS, CCC-SLP Admissions Coordinator [phone]

## 2024-02-19 NOTE — Progress Notes (Signed)
 Physical Medicine & Rehabilitation Consult Service  Pt discussed with rehab admissions coordinator. Chart has been reviewed. This is a with hx of inoperable duodenal adenoca, RA, CKD who presented to ED with new onset low back pain on 02/10/24. She was found to have an acute L 1 compression fx by CT and was also anemic. Pt was transfused and admitted for pain control, GI work up. EGD on 8/20 demonstrated white lesions and a malignant mass in duodenum with slow oozing of blood, spanning the entirety of duodenum. Eliquis  held, PuraStat injected by GI. Surgery does not recommend surgical intervention. Palliative Care involved to establish goals of care. Included in this discussion was that pt doesn't want SNF rehab and wants to go home with aggressive therapy, interested in CIR.  Cardiology continues to follow along for shortness of breath/chronic HFpEH/PAF. Pt was up yesterday and was min assist for sit-std and walked 75' CGA, needing cues for safety and rest breaks for SOB. Pt lives in an apt with her children. There is one flight of steps to enter. Dwelling is 1 level. She was requiring more assist recently d/t her medical decline but still was able to transfer and move around on a limited basis without help.    Home: Home Living Family/patient expects to be discharged to:: Private residence Living Arrangements: Children Available Help at Discharge: Family, Available 24 hours/day Type of Home: Apartment Home Access: Stairs to enter [employer] of Steps: flight Entrance Stairs-Rails: Right, Left Home Layout: One level Bathroom Shower/Tub: Engineer, manufacturing systems: Standard (has bedside commode) Bathroom Accessibility: Yes Home Equipment: Grab bars - tub/shower, Agricultural consultant (2 wheels), Cane - single point, Tub bench, BSC/3in1, Wheelchair - manual Additional Comments: on wait list for ~ 1 yr for main level apt per pt grand dtr; grand-dtr is a Runner, broadcasting/film/video and is about to go back to  school (she has been assisting), pt dtr is on chemo and cannot assist on her tx weeks  Lives With: Daughter  Functional History: Prior Function Prior Level of Function : Needs assist Mobility Comments: pt reports she was able to transfer after rehab without assist; recently requiring incr assist ADLs Comments: pt requires A for all ADLs, self care tasks and IADLs. Functional Status:  Mobility: Bed Mobility Overal bed mobility: Needs Assistance Bed Mobility: Rolling, Sidelying to Sit, Sit to Sidelying Rolling: Mod assist Sidelying to sit: Min assist, Mod assist Supine to sit: Mod assist General bed mobility comments: cues to log roll min cues and assist with bed pad, pt able to scoot anteriorly toward EOB once in sitting with CGA and min cues Transfers Overall transfer level: Needs assistance Equipment used: Rolling walker (2 wheels) Transfers: Sit to/from Stand Sit to Stand: Min assist Bed to/from chair/wheelchair/BSC transfer type:: Step pivot Step pivot transfers: Min assist, +2 physical assistance, +2 safety/equipment General transfer comment: TLSO donned seated EOB, min cues and pull to stand to RW Ambulation/Gait Ambulation/Gait assistance: Contact guard assist (chair follow for safety) Gait Distance (Feet): 75 Feet (75' twice with seated rest break) Assistive device: Rolling walker (2 wheels) Gait Pattern/deviations: Step-through pattern, Decreased stance time - right, Trunk flexed General Gait Details: pt initally amb with short choppy steps, noted leg length discepancy with pt endorsing L > R slight trunk flexion with TLSO donned, min cues for safety, RW management and pursed lip breathing Gait velocity: variable    ADL:    Cognition: Cognition Orientation Level: Oriented X4 Cognition Arousal: Alert Behavior During Therapy: WFL for tasks  assessed/performed   Assessment: 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 Additionally, the patient requires daily MD oversight of the active medical issues noted above. Projected goals would be supervision for mobility and self-care, min assist with stairs with an ELOS of 7 days.  Dispo and social supports are appropriate.    Rehab Admissions Coordinator to follow up.    Arthea IVAR Gunther, MD, [facility] Physical Medicine & Rehabilitation Medical Director Rehabilitation Services 02/19/2024

## 2024-02-19 NOTE — PMR Pre-admission (Shared)
 PMR Admission Coordinator Pre-Admission Assessment  Patient: Peggy Armstrong is an 78 y.o., female MRN: [id number] DOB: Jun 14, 1946 Height: 5' (152.4 cm) Weight: 89.3 kg  Insurance Information HMO: ***    PPO: ***     PCP:      IPA:      80/20:      OTHER:  PRIMARY: [employer] Dual Complete      Policy#: [id number]      Subscriber: patient CM Name: ***      Phone#: ***     Fax#: *** Pre-Cert#: ***      Employer: *** Benefits:  Phone #: ***     Name: *** Eustacio. Date: ***     Deduct: ***      Out of Pocket Max: ***      Life Max: *** CIR: ***      SNF: *** Outpatient: ***     Co-Pay: *** Home Health: ***      Co-Pay: *** DME: ***     Co-Pay: *** Providers: in-network SECONDARY: Medicaid of [place]      Policy#: [id number]     Phone#: [phone]  Financial Counselor:       Phone#:   The "Data Collection Information Summary" for patients in Inpatient Rehabilitation Facilities with attached "Privacy Act Statement-Health Care Records" was provided and verbally reviewed with: Patient  Emergency Contact Information Contact Information     Name Relation Home Work Mobile   [place] Daughter [phone]  [phone]      Other Contacts   None on File     Current Medical History  Patient Admitting Diagnosis: L1 compression fx and severe anemia History of Present Illness: Pt is a 78 year old female with medical hx significant for: duodenal adenocarcinoma, rheumatoid arthritis, iron  deficiency anemia, atrial flutter, CKD stage III, HTN, chronic diastolic CHF, AAA, hyperlipidemia, GI bleed, hemorrhagic shock.  Pt  presented to [facility] on 02/10/24 d/t back pain x 3 days. CT lumbar spine showed acute L1 compression fracture, chronic L3 compression fracture and chronic L2 burst fracture resulting in retropulsion and moderate spinal stenosis. Positive Hemoccult with Hgb of 5.5. Pt given 2 units PRBC on 8/16. GI consulted. Did not recommend endoscopy at that time. Pt underwent EGD by Dr.  Wilhelmenia on 02/14/24. Found slow oozing duodenal malignant mass. Developed shortness of breath after procedure and chest x-ray showed pulmonary edema. Pt given IV Lasix . Pt not a candidate for Whipple per surgery. EKG on 8/23 showed junctional tachycardia. Therapy evaluations completed and CIR recommended d/t pt's deficits in functional mobility.    Patient's medical record from [facility] has been reviewed by the rehabilitation admission coordinator and physician.  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   Cellulitis of left arm 08/14/2023   Cholangitis    Choledocholithiasis 03/22/2017   Chronic diastolic CHF (congestive heart failure) (HCC) 11/08/2019   Chronic low back pain 07/18/2023   CKD (chronic kidney disease) stage 3, GFR 30-59 ml/min (HCC) 06/09/2017   CKD (chronic kidney disease), stage III (HCC) 06/09/2017  Closed compression fracture of L2 lumbar vertebra, initial encounter (HCC) 05/11/2023   Clotting disorder (HCC)    right DVT     Dehydration 05/12/2023   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   Duodenal mass    Duodenal ulcer    DVT (deep venous thrombosis) (HCC)    right DVT   Epigastric pain    Essential hypertension    Fall at home, initial encounter 10/21/2023   GAD (generalized anxiety disorder)    Gastric polyps 05/16/2023   Generalized weakness 05/12/2023   GI bleed 07/20/2023   Gout    Gout    Hematemesis 11/05/2017   Hemorrhagic shock (HCC) 07/21/2023   History of depression 10/21/2023   History of DVT (deep vein thrombosis)  11/08/2019   History of GI bleed 10/21/2023   Hyperlipidemia 05/12/2023   Hypertension    Hypokalemia 07/03/2017   Hypothyroidism 05/12/2023   Iron  deficiency anemia 08/02/2016   Left elbow avulsion fracture 10/21/2023   Left humeral fracture 10/21/2023   Long term current use of anticoagulant therapy 06/20/2023   Macrocytic anemia 11/05/2017   Melena 11/05/2017   Morbid obesity (HCC) 11/08/2019   Nausea & vomiting    Need for immunization against influenza 07/18/2019   Need for vaccination 04/23/2018   Occult blood in stools 05/16/2023   Paroxysmal atrial fibrillation (HCC) 05/12/2023   Peri-ampullary neoplasm    Poor dentition 08/14/2016   RA (rheumatoid arthritis) (HCC)    Restless leg 12/02/2022   Right shoulder pain 07/26/2018   Routine general medical examination at a health care facility 12/13/2019   S/P ERCP 05/05/2017   SIRS (systemic inflammatory response syndrome) (HCC) 03/22/2017   Upper GI bleed 06/20/2023    Has the patient had major surgery during 100 days prior to admission? Yes  Family History   family history includes Colon cancer in her child and father; Diabetes in her paternal grandmother; Heart disease in her paternal grandfather; Hypertension in her brother and mother; Prostate cancer in her father; Rheum arthritis in her maternal grandmother.  Current Medications  Current Facility-Administered Medications:    acetaminophen  (TYLENOL ) tablet 650 mg, 650 mg, Oral, Q6H, 650 mg at 02/19/24 1128 **OR** acetaminophen  (TYLENOL ) suppository 650 mg, 650 mg, Rectal, Q6H, Jadine Toribio SQUIBB, MD   allopurinol  (ZYLOPRIM ) tablet 300 mg, 300 mg, Oral, Daily, Jadine Toribio SQUIBB, MD, 300 mg at 02/19/24 9091   amiodarone  (PACERONE ) tablet 200 mg, 200 mg, Oral, BID, Croitoru, Mihai, MD   arformoterol  (BROVANA ) nebulizer solution 15 mcg, 15 mcg, Nebulization, BID, Von Bellis, MD, 15 mcg at 02/19/24 0827   busPIRone  (BUSPAR ) tablet 10 mg, 10 mg, Oral, BID, Jadine Toribio SQUIBB, MD, 10 mg at 02/19/24 9091   cyanocobalamin  (VITAMIN B12) tablet 1,000 mcg, 1,000 mcg, Oral, Daily, Jadine Toribio SQUIBB, MD, 1,000 mcg at 02/19/24 0908   diltiazem  (CARDIZEM  CD) 24 hr capsule 300 mg, 300 mg, Oral, Daily, Nishan, Peter C, MD, 300 mg at 02/19/24 9090   diphenhydrAMINE  (BENADRYL ) capsule 25 mg, 25 mg, Oral, Q6H PRN, Jadine Toribio SQUIBB, MD, 25 mg at 02/14/24 1713   DULoxetine  (CYMBALTA ) DR capsule 20 mg, 20 mg, Oral, Daily, Jadine Toribio SQUIBB, MD, 20 mg at 02/19/24 9091   HYDROmorphone  (DILAUDID ) injection 0.5-1 mg, 0.5-1 mg, Intravenous, Q2H PRN, Jadine Toribio SQUIBB, MD, 1 mg at 02/14/24 2308   levalbuterol  (XOPENEX ) nebulizer solution 0.63 mg, 0.63 mg, Nebulization, TID, Vann, Jessica U, DO, 0.63 mg at 02/19/24 1343   levothyroxine  (SYNTHROID ) tablet 100 mcg, 100 mcg, Oral,  QAC breakfast, Jadine Toribio SQUIBB, MD, 100 mcg at 02/19/24 9472   methocarbamol  (ROBAXIN ) tablet 500 mg, 500 mg, Oral, Q6H PRN, Jadine Toribio SQUIBB, MD, 500 mg at 02/19/24 1338   ondansetron  (ZOFRAN ) tablet 4 mg, 4 mg, Oral, Q6H PRN **OR** ondansetron  (ZOFRAN ) injection 4 mg, 4 mg, Intravenous, Q6H PRN, Jadine Toribio SQUIBB, MD   oxyCODONE  (Oxy IR/ROXICODONE ) immediate release tablet 5-10 mg, 5-10 mg, Oral, Q4H PRN, Jadine Toribio SQUIBB, MD, 10 mg at 02/13/24 2323   pantoprazole  (PROTONIX ) EC tablet 40 mg, 40 mg, Oral, BID, Mansouraty, Gabriel Jr., MD, 40 mg at 02/19/24 0908   potassium chloride  SA (KLOR-CON  M) CR tablet 20 mEq, 20 mEq, Oral, QHS, Von Bellis, MD, 20 mEq at 02/18/24 2238   sodium chloride  flush (NS) 0.9 % injection 3 mL, 3 mL, Intravenous, Q12H, Jadine Toribio SQUIBB, MD, 3 mL at 02/18/24 2238   sodium chloride  flush (NS) 0.9 % injection 3 mL, 3 mL, Intravenous, Q12H, Jadine Toribio SQUIBB, MD, 3 mL at 02/19/24 0913   sodium chloride  flush (NS) 0.9 % injection 3 mL, 3 mL, Intravenous, PRN, Jadine Toribio SQUIBB, MD, 3 mL at 02/10/24 2124   sucralfate  (CARAFATE ) tablet 1 g, 1 g, Oral, BID, Mansouraty,  Aloha Raddle., MD, 1 g at 02/19/24 0908  Patients Current Diet:  Diet Order             Diet Heart Fluid consistency: Thin  Diet effective now                   Precautions / Restrictions Precautions Precautions: Fall Restrictions Weight Bearing Restrictions Per Provider Order: No   Has the patient had 2 or more falls or a fall with injury in the past year? Yes  Prior Activity Level Limited Community (1-2x/wk): Md appointments  Prior Functional Level Self Care: Did the patient need help bathing, dressing, using the toilet or eating? Needed some help  Indoor Mobility: Did the patient need assistance with walking from room to room (with or without device)? Needed some help  Stairs: Did the patient need assistance with internal or external stairs (with or without device)? Needed some help  Functional Cognition: Did the patient need help planning regular tasks such as shopping or remembering to take medications? Needed some help  Patient Information Are you of Hispanic, Latino/a,or Spanish origin?: A. No, not of Hispanic, Latino/a, or Spanish origin What is your race?: A. White Do you need or want an interpreter to communicate with a doctor or health care staff?: 0. No  Patient's Response To:  Health Literacy and Transportation Is the patient able to respond to health literacy and transportation needs?: Yes Health Literacy - How often do you need to have someone help you when you read instructions, pamphlets, or other written material from your doctor or pharmacy?: Always In the past 12 months, has lack of transportation kept you from medical appointments or from getting medications?: No In the past 12 months, has lack of transportation kept you from meetings, work, or from getting things needed for daily living?: No  Journalist, newspaper / Equipment Home Equipment: Grab bars - tub/shower, Agricultural consultant (2 wheels), [employer] - single point, Tub bench, BSC/3in1, Wheelchair -  manual  Prior Device Use: Indicate devices/aids used by the patient prior to current illness, exacerbation or injury? Manual wheelchair and Walker  Current Functional Level Cognition  Orientation Level: Oriented X4    Extremity Assessment (includes Sensation/Coordination)  Upper Extremity Assessment: Defer to OT evaluation  Lower Extremity Assessment: Generalized weakness    ADLs       Mobility  Overal bed mobility: Needs Assistance Bed Mobility: Rolling, Sidelying to Sit, Sit to Sidelying Rolling: Mod assist Sidelying to sit: Min assist, Mod assist Supine to sit: Mod assist General bed mobility comments: cues to log roll min cues and assist with bed pad, pt able to scoot anteriorly toward EOB once in sitting with CGA and min cues    Transfers  Overall transfer level: Needs assistance Equipment used: Rolling walker (2 wheels) Transfers: Sit to/from Stand Sit to Stand: Min assist Bed to/from chair/wheelchair/BSC transfer type:: Step pivot Step pivot transfers: Min assist, +2 physical assistance, +2 safety/equipment General transfer comment: TLSO donned seated EOB, min cues and pull to stand to RW    Ambulation / Gait / Stairs / Wheelchair Mobility  Ambulation/Gait Ambulation/Gait assistance: Contact guard assist (chair follow for safety) Gait Distance (Feet): 75 Feet (75' twice with seated rest break) Assistive device: Rolling walker (2 wheels) Gait Pattern/deviations: Step-through pattern, Decreased stance time - right, Trunk flexed General Gait Details: pt initally amb with short choppy steps, noted leg length discepancy with pt endorsing L > R slight trunk flexion with TLSO donned, min cues for safety, RW management and pursed lip breathing Gait velocity: variable    Posture / Balance Balance Overall balance assessment: Needs assistance, History of Falls Sitting-balance support: Feet unsupported, No upper extremity supported Sitting balance-Leahy Scale: Fair Standing  balance support: During functional activity, Reliant on assistive device for balance Standing balance-Leahy Scale: Poor Standing balance comment: unsteady without UE and external support    Special considerations/life events  Skin Ecchymosis: generalized/right, left; Erythema/Redness: scattered/bilateral; Scratch Marks: buttocks, leg, Bladder Incontinence, External Urinary Catheter   Previous Home Environment (from acute therapy documentation) Living Arrangements: Children  Lives With: Daughter Available Help at Discharge: Family, Available 24 hours/day Type of Home: Apartment Home Layout: One level Home Access: Stairs to enter Entrance Stairs-Rails: Right, Left Entrance Stairs-Number of Steps: flight Bathroom Shower/Tub: Engineer, manufacturing systems: Standard (has bedside commode) Bathroom Accessibility: Yes How Accessible: Accessible via walker, Accessible via wheelchair Home Care Services: Yes (was supposed to start but hospitalized) Type of Home Care Services: Home RN, Home OT, Home PT Home Care Agency (if known): [facility] Additional Comments: on wait list for ~ 1 yr for main level apt per pt grand dtr; grand-dtr is a Runner, broadcasting/film/video and is about to go back to school (she has been assisting), pt dtr is on chemo and cannot assist on her tx weeks  Discharge Living Setting Plans for Discharge Living Setting: Patient's home Type of Home at Discharge: Apartment Discharge Home Layout: One level Discharge Home Access: Stairs to enter Entrance Stairs-Rails: Right, Left Entrance Stairs-Number of Steps: 13 Discharge Bathroom Shower/Tub: Tub/shower unit Discharge Bathroom Toilet: Standard (has bedside commode) Discharge Bathroom Accessibility: Yes How Accessible: Accessible via walker, Accessible via wheelchair Does the patient have any problems obtaining your medications?: No  Social/Family/Support Systems Anticipated Caregiver: Delmi Fulfer (daughter) and granddaughters Anticipated  Caregiver's Contact Information: [phone] Caregiver Availability: 24/7 Discharge Plan Discussed with Primary Caregiver: Yes Is Caregiver In Agreement with Plan?: Yes Does Caregiver/Family have Issues with Lodging/Transportation while Pt is in Rehab?: No  Goals Patient/Family Goal for Rehab: *** Expected length of stay: *** Pt/Family Agrees to Admission and willing to participate: Yes Program Orientation Provided & Reviewed with Pt/Caregiver Including Roles  & Responsibilities: Yes  Decrease burden of Care through IP rehab admission: NA  Possible need for  SNF placement upon discharge:  Not anticipated  Patient Condition: I have reviewed medical records from [facility], spoken with [facility], and patient and daughter. I discussed via phone for inpatient rehabilitation assessment.  Patient will benefit from ongoing PT and OT, can actively participate in 3 hours of therapy a day 5 days of the week, and can make measurable gains during the admission.  Patient will also benefit from the coordinated team approach during an Inpatient Acute Rehabilitation admission.  The patient will receive intensive therapy as well as Rehabilitation physician, nursing, social worker, and care management interventions.  Due to bladder management, safety, skin/wound care, disease management, medication administration, pain management, and patient education the patient requires 24 hour a day rehabilitation nursing.  The patient is currently *** with mobility and basic ADLs.  Discharge setting and therapy post discharge at home with home health is anticipated.  Patient has agreed to participate in the Acute Inpatient Rehabilitation Program and will admit {Time; today/tomorrow:10263}.  Preadmission Screen Completed By:  Tinnie SHAUNNA Yvone Delayne, 02/19/2024 2:37 PM ______________________________________________________________________   Discussed status with Dr. PIERRETTE on *** at *** and received approval for admission  today.  Admission Coordinator:  Tinnie SHAUNNA Yvone Delayne, CCC-SLP, time ***/Date ***   Assessment/Plan: Diagnosis: *** Does the need for close, 24 hr/day Medical supervision in concert with the patient's rehab needs make it unreasonable for this patient to be served in a less intensive setting? {yes_no_potentially:3041433} Co-Morbidities requiring supervision/potential complications: *** Due to {due un:6958565}, does the patient require 24 hr/day rehab nursing? {yes_no_potentially:3041433} Does the patient require coordinated care of a physician, rehab nurse, PT, OT, and SLP to address physical and functional deficits in the context of the above medical diagnosis(es)? {yes_no_potentially:3041433} Addressing deficits in the following areas: {deficits:3041436} Can the patient actively participate in an intensive therapy program of at least 3 hrs of therapy 5 days a week? {yes_no_potentially:3041433} The potential for patient to make measurable gains while on inpatient rehab is {potential:3041437} Anticipated functional outcomes upon discharge from inpatient rehab: {functional outcomes:304600100} PT, {functional outcomes:304600100} OT, {functional outcomes:304600100} SLP Estimated rehab length of stay to reach the above functional goals is: *** Anticipated discharge destination: {anticipated dc setting:21604} 10. Overall Rehab/Functional Prognosis: {potential:3041437}   MD Signature: ***

## 2024-02-20 DIAGNOSIS — Z515 Encounter for palliative care: Secondary | ICD-10-CM

## 2024-02-20 DIAGNOSIS — Z7189 Other specified counseling: Secondary | ICD-10-CM

## 2024-02-20 DIAGNOSIS — C170 Malignant neoplasm of duodenum: Secondary | ICD-10-CM | POA: Diagnosis not present

## 2024-02-20 DIAGNOSIS — S32010A Wedge compression fracture of first lumbar vertebra, initial encounter for closed fracture: Secondary | ICD-10-CM | POA: Diagnosis not present

## 2024-02-20 DIAGNOSIS — K922 Gastrointestinal hemorrhage, unspecified: Secondary | ICD-10-CM

## 2024-02-20 DIAGNOSIS — D649 Anemia, unspecified: Secondary | ICD-10-CM | POA: Diagnosis not present

## 2024-02-20 HISTORY — DX: Other specified counseling: Z71.89

## 2024-02-20 LAB — CBC
HCT: 31.6 % — ABNORMAL LOW (ref 36.0–46.0)
Hemoglobin: 8.8 g/dL — ABNORMAL LOW (ref 12.0–15.0)
MCH: 27.4 pg (ref 26.0–34.0)
MCHC: 27.8 g/dL — ABNORMAL LOW (ref 30.0–36.0)
MCV: 98.4 fL (ref 80.0–100.0)
Platelets: 164 K/uL (ref 150–400)
RBC: 3.21 MIL/uL — ABNORMAL LOW (ref 3.87–5.11)
RDW: 18.3 % — ABNORMAL HIGH (ref 11.5–15.5)
WBC: 5.8 K/uL (ref 4.0–10.5)
nRBC: 0 % (ref 0.0–0.2)

## 2024-02-20 LAB — BASIC METABOLIC PANEL WITH GFR
Anion gap: 9 (ref 5–15)
BUN: 34 mg/dL — ABNORMAL HIGH (ref 8–23)
CO2: 28 mmol/L (ref 22–32)
Calcium: 8 mg/dL — ABNORMAL LOW (ref 8.9–10.3)
Chloride: 103 mmol/L (ref 98–111)
Creatinine, Ser: 1.33 mg/dL — ABNORMAL HIGH (ref 0.44–1.00)
GFR, Estimated: 41 mL/min — ABNORMAL LOW (ref >60)
Glucose, Bld: 83 mg/dL (ref 70–99)
Potassium: 3.2 mmol/L — ABNORMAL LOW (ref 3.5–5.1)
Sodium: 140 mmol/L (ref 135–145)

## 2024-02-20 LAB — MAGNESIUM: Magnesium: 1.7 mg/dL (ref 1.7–2.4)

## 2024-02-20 MED ORDER — SPIRONOLACTONE 12.5 MG HALF TABLET
12.5000 mg | ORAL_TABLET | Freq: Every day | ORAL | Status: DC
  Administered 2024-02-20 – 2024-02-25 (×6): 12.5 mg via ORAL
  Filled 2024-02-20 (×6): qty 1

## 2024-02-20 MED ORDER — MELATONIN 5 MG PO TABS
5.0000 mg | ORAL_TABLET | Freq: Every day | ORAL | Status: DC
  Administered 2024-02-20 – 2024-02-24 (×5): 5 mg via ORAL
  Filled 2024-02-20 (×5): qty 1

## 2024-02-20 NOTE — Plan of Care (Signed)

## 2024-02-20 NOTE — Progress Notes (Signed)
 Inpatient Rehab Admissions Coordinator:   I received a denial for CIR. I called and left VM for daughter to notify her. I will let TOC know.  Leita Kleine, MS, CCC-SLP Rehab Admissions Coordinator  [phone] (celll) [phone] (office)

## 2024-02-20 NOTE — Progress Notes (Signed)
 No further arrhythmia in last 24 hours. Several reports of pause or asystole on telemetry are artifact, due to R wave undersensing. No true bradycardia is seen. [facility] will sign off.   Medication Recommendations:   Eliquis  stopped Amiodarone  200 mg twice daily for 2 weeks, then back to 200 mg daily Diltiazem  SR 300 mg daily (new dose) Spironolactone  12.5 mg daily Torsemide  20 mg daily  Other recommendations (labs, testing, etc):  CMET, TSH in one month Follow up as an outpatient:  has appt scheduled 02/28/2024

## 2024-02-20 NOTE — Progress Notes (Signed)
 Occupational Therapy Treatment Patient Details Name: Peggy Armstrong MRN: [id number] DOB: 1946-06-16 Today's Date: 02/20/2024   History of present illness 78 y.o. female who presented to the emergency department 02/10/24 with profound fatigue and back pain. Pt found to have hemoglobin 5.5 and positive fecal occult blood. EGD on 8/20. CT= Acute L1 compression fracture.  2. Mild L3 compression fracture with progressive height loss since  10/20/2023.  3. 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 GI consulted. Pt with extensive medical history including but not limited to congestive heart failure, A-fib on Eliquis , AAA, HTN, chronic back pain, history of DVT, CKD and ampullary adenocarcinoma, L humerus fx, RA, L elbow avulsion fx, LLE fx   OT comments  Pt seen for OT treatment this date, overlap with PT for safe progression of gait and functional mobility with close chair follow for safety. Pt continues to require education and reinforcement of spinal precautions, while she does well verbalizing BLT precautions start of session, she has difficulties verbalizing how to implement during functional task performance. Pt reports having LB AE at home and is able to describe use of reacher for safe LB dressing. Pt progressing in mobility performance, requiring MIN A for bed mobility, CGA for functional STS transfers and is able to complete mobility with increased distance and improved step pattern from yesterday's session. OT will continue to follow. Discharge recommendation appropriate, pt would benefit from continued inpatient therapy. Recommending STR if AIR is not a possibility.       If plan is discharge home, recommend the following:  A lot of help with walking and/or transfers;A lot of help with bathing/dressing/bathroom;Assistance with cooking/housework;Assist for transportation;Help with stairs or ramp for entrance   Equipment Recommendations  None recommended  by OT    Recommendations for Other Services Rehab consult    Precautions / Restrictions Precautions Precautions: Fall;Back Recall of Precautions/Restrictions: Impaired Precaution/Restrictions Comments: recalls 3/3 spinal precautions start of session; cannot verbalize how to functionally implement during LB ADL performance Required Braces or Orthoses: Spinal Brace Spinal Brace: Thoracolumbosacral orthotic;Applied in sitting position       Mobility Bed Mobility Overal bed mobility: Needs Assistance Bed Mobility: Rolling, Sidelying to Sit Rolling: Min assist, Used rails Sidelying to sit: Min assist Supine to sit: Min assist     General bed mobility comments: cues for log roll technique. TLSO donned sitting EOB.    Transfers Overall transfer level: Needs assistance Equipment used: Rolling walker (2 wheels) Transfers: Sit to/from Stand Sit to Stand: Contact guard assist, +2 safety/equipment           General transfer comment: improved transfers, multiple STS performed with RW     Balance Overall balance assessment: Needs assistance, History of Falls Sitting-balance support: Feet supported, Bilateral upper extremity supported Sitting balance-Leahy Scale: Fair     Standing balance support: During functional activity, Reliant on assistive device for balance Standing balance-Leahy Scale: Poor Standing balance comment: unsteady without UE and external support                           ADL either performed or assessed with clinical judgement   ADL Overall ADL's : Needs assistance/impaired     Grooming: Sitting;Wash/dry face;Wash/dry hands;Brushing hair;Set up Grooming Details (indicate cue type and reason): recliner level after mobility             Lower Body Dressing: With adaptive equipment Lower Body Dressing Details (indicate cue  type and reason): education re: spinal precautions and use of AE. Pt initally verbalizes that she can bend down to put on  socks/shoes, and requires cues to remember BLT. Pt does have reacher/sock aide and knows how to use them per her report, but has difficulties observing precautions when asked to teach back             Functional mobility during ADLs: Contact guard assist;Minimal assistance;+2 for physical assistance;+2 for safety/equipment;Cueing for safety;Cueing for sequencing;Rolling walker (2 wheels) General ADL Comments: pt will require continued reinforcement of spinal precautions during all ADL task performance, improved functional mobility and gait progression.    Extremity/Trunk Assessment              Vision       Restaurant manager, fast food Communication: No apparent difficulties   Cognition Arousal: Alert Behavior During Therapy: WFL for tasks assessed/performed               OT - Cognition Comments: decreased recall of precautions, decreased insight                 Following commands: Intact        Cueing   Cueing Techniques: Verbal cues             Pertinent Vitals/ Pain       Pain Assessment Pain Assessment: Faces Faces Pain Scale: Hurts a little bit Pain Location: back, generalized Pain Descriptors / Indicators: Grimacing, Guarding Pain Intervention(s): Limited activity within patient's tolerance, Monitored during session, Repositioned   Frequency  Min 2X/week        Progress Toward Goals  OT Goals(current goals can now be found in the care plan section)  Progress towards OT goals: Progressing toward goals  Acute Rehab OT Goals OT Goal Formulation: With patient Time For Goal Achievement: 03/04/24 Potential to Achieve Goals: Good ADL Goals Pt Will Perform Upper Body Bathing: with contact guard assist;with adaptive equipment;sitting Pt Will Perform Lower Body Bathing: with contact guard assist;with caregiver independent in assisting;with adaptive equipment;sitting/lateral leans;sit to/from stand Pt Will Perform  Upper Body Dressing: with contact guard assist;with adaptive equipment;sitting Pt Will Perform Lower Body Dressing: with contact guard assist;with adaptive equipment;sitting/lateral leans;sit to/from stand Pt Will Transfer to Toilet: with contact guard assist;ambulating;bedside commode Pt Will Perform Toileting - Clothing Manipulation and hygiene: sit to/from stand;sitting/lateral leans;with adaptive equipment  Plan      Co-evaluation    PT/OT/SLP Co-Evaluation/Treatment: Yes Reason for Co-Treatment: For patient/therapist safety;To address functional/ADL transfers PT goals addressed during session: Mobility/safety with mobility;Balance OT goals addressed during session: ADL's and self-care      AM-PAC OT 6 Clicks Daily Activity     Outcome Measure   Help from another person eating meals?: None Help from another person taking care of personal grooming?: A Little Help from another person toileting, which includes using toliet, bedpan, or urinal?: A Lot Help from another person bathing (including washing, rinsing, drying)?: A Lot Help from another person to put on and taking off regular upper body clothing?: A Lot Help from another person to put on and taking off regular lower body clothing?: A Lot 6 Click Score: 15    End of Session Equipment Utilized During Treatment: Gait belt;Rolling walker (2 wheels);Back brace  OT Visit Diagnosis: Muscle weakness (generalized) (M62.81);History of falling (Z91.81);Pain;Unsteadiness on feet (R26.81)   Activity Tolerance Patient tolerated treatment well;No increased pain   Patient Left in chair;with call bell/phone  within reach;with chair alarm set   Nurse Communication Mobility status;Precautions        Time: [phone] OT Time Calculation (min): 23 min  Charges: OT General Charges $OT Visit: 1 Visit OT Treatments $Self Care/Home Management : 8-22 mins  Santa Abdelrahman L. Kellis Mcadam, OTR/L  02/20/24, 4:54 PM

## 2024-02-20 NOTE — Progress Notes (Signed)
 PT Note  Patient Details Name: Peggy Armstrong MRN: [id number] DOB: 1946-05-13   Cancelled Treatment:    Reason Eval/Treat Not Completed: Fatigue/lethargy limiting ability to participate. PT arrived 1122 and pt resting, pt reported feeling very tired this am and requested PT return later in the day. PT to return as schedule allows. PT to continue to follow acutely.   [place], PT Acute Rehab   [place] VEAR Drone 02/20/2024, 11:35 AM

## 2024-02-20 NOTE — Progress Notes (Signed)
 Inpatient Rehab Admissions Coordinator:    CIR following, case sent to insurance this AM.  Leita Kleine, MS, CCC-SLP Rehab Admissions Coordinator  [phone] (celll) [phone] (office)

## 2024-02-20 NOTE — TOC Progression Note (Signed)
 Transition of Care [facility]) - Progression Note    Patient Details  Name: Peggy Armstrong MRN: [id number] Date of Birth: Jun 17, 1946  Transition of [facility]) CM/SW Contact  [name], RN Phone Number: 02/20/2024, 10:13 AM  Clinical Narrative: CIR eval completed, appropriate per CIR, await medical stability.       Expected Discharge Plan: Home w Home Health Services Barriers to Discharge: Continued Medical Work up               Expected Discharge Plan and Services       Living arrangements for the past 2 months: Apartment                                       Social Drivers of Health (SDOH) Interventions SDOH Screenings   Food Insecurity: No Food Insecurity (02/10/2024)  Housing: Low Risk  (02/10/2024)  Transportation Needs: No Transportation Needs (02/10/2024)  Utilities: Not At Risk (02/10/2024)  Depression (PHQ2-9): Low Risk  (12/22/2023)  Financial Resource Strain: Low Risk  (12/02/2022)   Received from [facility]  Physical Activity: Unknown (04/05/2022)   Received from [name]  Social Connections: Moderately Isolated (02/10/2024)  Stress: No Stress Concern Present (04/05/2022)   Received from [facility]  Tobacco Use: Medium Risk (02/14/2024)    Readmission Risk Interventions    02/12/2024    2:43 PM 07/24/2023    5:23 PM 05/14/2023    6:44 PM  Readmission Risk Prevention Plan  Transportation Screening Complete Complete Complete  PCP or Specialist Appt within 3-5 Days Complete Complete Complete  HRI or Home Care Consult Complete Complete Complete  Social Work Consult for Recovery Care Planning/Counseling  Complete Complete  Palliative Care Screening Not Applicable Not Applicable Complete  Medication Review Oceanographer) Complete Referral to Pharmacy Referral to Pharmacy

## 2024-02-20 NOTE — Plan of Care (Incomplete)
  Problem: Clinical Measurements: Goal: Ability to maintain clinical measurements within normal limits will improve Outcome: Progressing Goal: Will remain free from infection Outcome: Progressing Goal: Diagnostic test results will improve Outcome: Progressing Goal: Respiratory complications will improve Outcome: Progressing Goal: Cardiovascular complication will be avoided Outcome: Progressing   Problem: Nutrition: Goal: Adequate nutrition will be maintained Outcome: Progressing   Problem: Coping: Goal: Level of anxiety will decrease Outcome: Progressing   Problem: Elimination: Goal: Will not experience complications related to bowel motility Outcome: Progressing Goal: Will not experience complications related to urinary retention Outcome: Progressing   Problem: Safety: Goal: Ability to remain free from injury will improve Outcome: Progressing   Problem: Skin Integrity: Goal: Risk for impaired skin integrity will decrease Outcome: Progressing   

## 2024-02-20 NOTE — Progress Notes (Addendum)
 Progress Note  Patient Name: Peggy Armstrong Date of Encounter: 02/20/2024 [facility] Cardiologist: Alean SAUNDERS Madireddy, MD   Interval Summary   Continues to feel much better and breathing is improving.  Currently getting another treatment.  Creatinine slightly improved today.  She has no complaints. Vital Signs Vitals:   02/19/24 1400 02/19/24 1934 02/19/24 2103 02/20/24 0742  BP: 116/65  112/74   Pulse: 65  67   Resp: 20  17   Temp: 98.6 F (37 C)  98.2 F (36.8 C)   TempSrc:   Oral   SpO2: 100% 95% 100% 99%  Weight:      Height:        Intake/Output Summary (Last 24 hours) at 02/20/2024 0756 Last data filed at 02/20/2024 0732 Gross per 24 hour  Intake 1800 ml  Output 2500 ml  Net -700 ml      02/10/2024    3:06 PM 02/01/2024    4:03 PM 12/27/2023   12:10 PM  Last 3 Weights  Weight (lbs) 196 lb 13.9 oz 197 lb 186 lb  Weight (kg) 89.3 kg 89.359 kg 84.369 kg      Telemetry/ECG  Sinus rhythm heart rates in the 60s.at time it reads heart rates in the 30s but suspect this is low voltage and it is not bleeding.  Personally Reviewed  Physical Exam  GEN: No acute distress.   Neck: No JVD Cardiac: RRR, no murmurs, rubs, or gallops.  Respiratory: Clear to auscultation bilaterally GI: Soft, nontender, non-distended  MS: No edema  Patient Profile Patient with past medical history significant for atypical atrial flutter/PAF, chronic HFpEF, hypertension, history of recurrent GI bleeds, DVT 2017, adenocarcinoma status post embolectomy 2019, hypothyroidism, anemia.  Initially presented for back pain showing acute L1 compression fracture, found to be profoundly anemic with hemoglobin of 5.5.  Cardiology asked to see for atrial flutter/HFpEF.  Assessment & Plan   Chronic HFpEF Overall appears to be euvolemic.  After Lasix  and breathing treatment yesterday she feels much better.  Getting another breathing treatment now.  Continues to show improvement. Plan  yesterday to transition back to home Lasix  20 mg but she has persistent issues with hypokalemia so I think she would benefit from starting spironolactone  12.5 mg.  Can use Lasix  at home as needed. Getting K supplementation too, watch closely and may be able to DC supplementation.  Check BMP in 1 week.  Paroxysmal atrial flutter/fibrillation Maintaining sinus rhythm.  Here she was having ectopic atrial tachycardia episodes.  Not felt to be atrial flutter or fibrillation.  No further tachycardic episodes today.  Amiodarone  was increased to twice daily dosing yesterday. 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  Here with acute anemia requiring blood transfusion.  DOAC has been held, will defer to her primary cardiologist and oncology to make final decisions for long terms plans of anticoagulation. Has recurrent issues of GI bleed so likely will be stopped. Continue with amiodarone  200 mg twice daily, diltiazem  300 mg Not a candidate for watchman's with overall poor prognosis.  Acute anemia Duodenal adenocarcinoma  Hemoglobin 5.5.  Transfused 2 units 8/16. Stable now. EGD 8/20 showing oozing duodenal malignant mass.  Palliative care following.  Will schedule follow up.    For questions or updates, please contact [facility] Please consult [email or web address] for contact info under       Signed, Thom LITTIE Sluder, PA-C     No further arrhythmia in last 24  hours. Several reports of pause or asystole on telemetry are artifact, due to R wave undersensing. No true bradycardia is seen. [facility] will sign off.   Medication Recommendations:   Eliquis  stopped Amiodarone  200 mg twice daily for 2 weeks, then back to 200 mg daily Diltiazem  SR 300 mg daily (new dose) Spironolactone  12.5 mg daily Torsemide  20 mg daily  Other recommendations (labs, testing, etc):  CMET, TSH in one month Follow up as an outpatient:  has appt scheduled  02/28/2024

## 2024-02-20 NOTE — Progress Notes (Signed)
 [facility]  PROGRESS NOTE  REDELL BHANDARI FMW:[id number] DOB: 1946/01/02 DOA: 02/10/2024 PCP: Iven [name] DASEN, PA-C   Brief HPI:   78 year old woman complex PMH including inoperable duodenal adenocarcinoma, rheumatoid arthritis, CKD, multiple spontaneous vertebral compression fractures, who presented to the Emergency Department for new back pain. Evaluation was notable for profound anemia and CT showed acute L1 compression fracture. She was admitted for transfusion, pain control. GI was consulted. Hemoglobin stabilized, she eventually elected endoscopy which will be performed tomorrow. She refuses SNF and will discharge home with home health when ready.  Procedures/Events 8/16 admit for severe anemia, new L1 compression fx with acute pain 8/20 s/p EGD:  White nummular lesions in esophageal mucosa.                            Brushings performed to rule out Candida. - Multiple inflammatoryappearing gastric polyps.  - Malignant duodenal mass with mild slow oozing taking over the entirety of the duodenum 2. PuraStat injected. Slow to recover with hospital stay complicated by volume overload and subacute kidney injury.  Plan is for eventual transfer to CIR if insurance approves.     Assessment/Plan:    Acute on chronic anemia, macrocytic, asymptomatic -Severe anemia hemoglobin 5.5.  Transfused 2 units 8/16 with appropriate rise.  Hemoglobin remained stable. -GI consultation appreciated.  Holding Eliquis .   -8/20 s/p EGD: Oozing duodenal malignant mass, pleural State injected by GI.  Patient remains at high risk for rebleeding if DOAC restarted.   Duodenal adenocarcinoma -Follow-up with primary oncologist in [place] as an outpatient 8/21 patient was seen by general surgery, recommended no surgical intervention, patient is very poor candidate, recommended radiation therapy.  General surgery will discuss with oncologist Dr. Ezzard and will refer her to radiation oncology.  Patient  agreed with this plan -Palliative care consult for goals of care-full scope for now   Shortness of breath and wheezing possible due to acute pulmonary edema/Acute on chronic diastolic CHF exacerbation -Diurese with Lasix -oral -TTE LVEF 65%, G1 DD, mild to moderate TR, moderate atrial dilatation.  No any other significant finding   Hypokalemia due to diuresis Replete     Iron  deficiency, Tsat 4%. IV Venofer  300 mg x 1 dose given Continue oral iron  supplement on discharge     Acute L1 compression fracture -Pain control, early ambulation.  -Consult TOC, physical therapy is recommending SNF.  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  -Patient remains at high risk for rebleeding on DOAC.  Would recommend to follow-up with cardiology to restart DOAC if able, not a candidate for Watchman procedure - Cardiology following     CKD stage IIIa - Trending down status post diuresis   Hypophosphatemia - Replete   obesity Body mass index is 38.45 kg/m.     Medications     acetaminophen   650 mg Oral Q6H   Or   acetaminophen   650 mg Rectal Q6H   allopurinol   300 mg Oral Daily   amiodarone   200 mg Oral BID   arformoterol   15 mcg Nebulization BID   busPIRone   10 mg Oral BID   cyanocobalamin   1,000 mcg Oral Daily   diltiazem   300 mg Oral Daily   DULoxetine   20 mg Oral Daily   levalbuterol   0.63 mg Nebulization TID   levothyroxine   100 mcg Oral QAC breakfast   pantoprazole   40 mg Oral BID   potassium chloride  SA  20 mEq Oral QHS   sodium chloride  flush  3 mL Intravenous Q12H   sodium chloride  flush  3 mL Intravenous Q12H   spironolactone   12.5 mg  Oral Daily   sucralfate   1 g Oral BID     Data Reviewed:   CBG:     Vitals:   02/19/24 1934 02/19/24 2103 02/20/24 0742 02/20/24 0958  BP:  112/74  (!) 115/57  Pulse:  67  63  Resp:  17  16  Temp:  98.2 F (36.8 C)    TempSrc:  Oral    SpO2: 95% 100% 99% 97%  Weight:      Height:          Data Reviewed:  Basic Metabolic Panel: Recent Labs  Lab 02/14/24 1042 02/15/24 0503 02/16/24 0448 02/17/24 0541 02/18/24 0437 02/19/24 0516 02/20/24 0450  NA 138 137 139 140 140 139 140  K 4.3 3.6 3.1* 4.0 3.9 3.8 3.2*  CL 107 104 103 102 104 107 103  CO2 26 23 28 29 27 25 28   GLUCOSE 89 117* 120* 107* 107* 78 83  BUN 22 20 28* 37* 40* 38* 34*  CREATININE 1.06* 0.94 0.98 1.27* 1.35* 1.43* 1.33*  CALCIUM  8.4* 8.1* 8.3* 8.5* 8.4* 8.0* 8.0*  MG 1.8 1.8 1.8 1.8  --   --  1.7  PHOS 3.4 3.5 2.7 2.1* 3.4  --   --     CBC: Recent Labs  Lab 02/16/24 0448 02/17/24 0541 02/18/24 0437 02/19/24 0516 02/20/24 0450  WBC 4.4 6.5 5.6 5.9 5.8  HGB 8.7* 8.4* 8.4* 8.4* 8.8*  HCT 31.7* 30.0* 30.7* 30.0* 31.6*  MCV 96.4 95.8 97.8 98.7 98.4  PLT 172 203 190 170 164    LFT No results for input(s): AST, ALT, ALKPHOS, BILITOT, PROT, ALBUMIN in the last 168 hours.    Antibiotics: Anti-infectives (From admission, onward)    Start     Dose/Rate Route Frequency Ordered Stop   02/10/24 1000  cephALEXin  (KEFLEX ) capsule 500 mg        500 mg Oral  Once 02/10/24 0950 02/10/24 1014        DVT prophylaxis: SCDs  Code Status: Full code  Family Communication: Called daughter 8/25   CONSULTS: Cardiology, GI   Subjective   Excited for rehab  Objective    Physical Examination:   General: Appearance:    Obese female in no acute distress     Lungs:   Upper airway wheezing  Heart:    Normal heart rate. irr   MS:   All extremities are intact.   Neurologic:   Awake, alert    Status is: Inpatient:             Latresa Gasser U Shaundrea Carrigg   [facility] If  7PM-7AM, please contact night-coverage at [email or web address], Office  [phone]   02/20/2024, 10:26 AM  LOS: 9 days

## 2024-02-20 NOTE — Progress Notes (Signed)
  Daily Progress Note   Patient Name: Peggy Armstrong       Date: 02/20/2024 DOB: Jan 25, 1946  Age: 78 y.o. MRN#: [id number] Attending Physician: Juvenal Harlene PENNER, DO Primary Care Physician: Rothfuss, Jacob T, PA-C Admit Date: 02/10/2024 Length of Stay: 9 days  Reason for Consultation/Follow-up: Establishing goals of care  Subjective:    Reviewed EMR including recent documentation from cardiology, hospitalist, and TOC.  CIR is following and case has been sent to insurance for review. At time of EMR review in past 24 hours patient has not required any as needed oxycodone  for pain management. Patient resting and awaiting insurance review of CIR acceptance.  Objective:   Vital Signs:  BP 112/74 (BP Location: Left Arm)   Pulse 67   Temp 98.2 F (36.8 C) (Oral)   Resp 17   Ht 5' (1.524 m)   Wt 89.3 kg   SpO2 99%   BMI 38.45 kg/m    Assessment & Plan:   Assessment: Patient is a 78 year old female with a past medical history of inoperable duodenal adenocarcinoma, rheumatoid arthritis, CKD, and multiple spontaneous vertebral compression fractures who was admitted on 02/10/2024 for new back pain.  During admission patient found to have acute L1 fracture as well as profound anemia.  GI consulted and patient's status post EGD.  Palliative medicine team consulted to assist with complex medical decision making.  Recommendations/Plan: # Complex medical decision making/goals of care:  - Patient has expressed wishes to receive full scope of appropriate medical care and to remain full code.  PMT providers have already encouraged patient to have frank discussion with oncologist during her next appointment on 02/23/2024 regarding prognosis.  Patient currently awaiting insurance review of CIR to determine if accepted.  Palliative medicine team continuing to follow along to engage in conversations as able and appropriate.  - Patient's reported HCPOA is daughter, Peggy Armstrong.  ACP documentation not  found in EMR.  -  Code Status: Full Code  # Discharge Planning: To Be Determined  Thank you for allowing the palliative care team to participate in the care Peggy Armstrong.  Tinnie Radar, DO Palliative Care Provider PMT # [phone]  If patient remains symptomatic despite maximum doses, please call PMT at [phone] between 0700 and 1900. Outside of these hours, please call attending, as PMT does not have night coverage.

## 2024-02-20 NOTE — Progress Notes (Signed)
 Physical Therapy Treatment Patient Details Name: Peggy Armstrong MRN: [id number] DOB: [birth year] Today's Date: 02/20/2024   History of Present Illness 78 y.o. female who presented to the emergency department 02/10/24 with profound fatigue and back pain. Pt found to have hemoglobin 5.5 and positive fecal occult blood. EGD on 8/20. CT= Acute L1 compression fracture.  2. Mild L3 compression fracture with progressive height loss since  10/20/2023.  3. 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 GI consulted. Pt with extensive medical history including but not limited to congestive heart failure, A-fib on Eliquis , AAA, HTN, chronic back pain, history of DVT, CKD and ampullary adenocarcinoma, L humerus fx, RA, L elbow avulsion fx, LLE fx    PT Comments   Pt admitted with above diagnosis.  Pt currently with functional limitations due to the deficits listed below (see PT Problem List). Pt reported feeling better in the PM when PT returned, pt agreeable to therapy intervention. Pt required min A for supine to sit with cues for log roll technique, A to don TLSO seated EOB, CGA for sit to stand  from EOB with cues, gait tasks in hallway 100 feet with RW and CGA with cues for safety, posture and more normalized gait pattern, pt exhibited signs and symptoms of SOB with gait tasks today and 92% on RA. Pt left seated in recliner, all needs in place and TLSO doffed per pt request. PT and OT overlap for tx for safety with mobility tasks today. Pt will require reinforcement for observation of back precautions, pt is able to recall 3/3. Pt will benefit from continued therapy services following hospital d/c SNF vs AIR.  Pt will benefit from acute skilled PT to increase their independence and safety with mobility to allow discharge.      If plan is discharge home, recommend the following: Help with stairs or ramp for entrance;Assist for transportation;Assistance with  cooking/housework;A lot of help with walking and/or transfers;A lot of help with bathing/dressing/bathroom   Can travel by private vehicle     Yes  Equipment Recommendations  None recommended by PT    Recommendations for Other Services       Precautions / Restrictions Precautions Precautions: Fall;Back Recall of Precautions/Restrictions: Impaired Precaution/Restrictions Comments: recalls 3/3 spinal precautions start of session; cannot verbalize how to functionally observe with ADL and mobility tasks Required Braces or Orthoses: Spinal Brace Spinal Brace: Thoracolumbosacral orthotic;Applied in sitting position Restrictions Weight Bearing Restrictions Per Provider Order: No     Mobility  Bed Mobility Overal bed mobility: Needs Assistance Bed Mobility: Sidelying to Sit, Rolling Rolling: Contact guard assist, Used rails Sidelying to sit: Min assist       General bed mobility comments: cues for log roll technique. TLSO donned sitting EOB.    Transfers Overall transfer level: Needs assistance Equipment used: Rolling walker (2 wheels) Transfers: Sit to/from Stand Sit to Stand: Contact guard assist, +2 safety/equipment           General transfer comment: min cues for safety and proper technique and RW management    Ambulation/Gait Ambulation/Gait assistance: Contact guard assist Gait Distance (Feet): 100 Feet Assistive device: Rolling walker (2 wheels) Gait Pattern/deviations: Step-to pattern, Decreased stride length, Decreased dorsiflexion - right, Decreased dorsiflexion - left, Trunk flexed Gait velocity: decreased     General Gait Details: pt able to demonstrate more fluid gait pattern with heel to toe and B LEs almost passing in stance phase with the exception of turns or approach  to sitting surfaces with pt reverting to short choppy stepping pattern, lateral sway attributed to leg length discrepancy,  cues provided throughout for posture, safety and more normalized  gait pattern  pt exhibited improved gait tolerance today   Stairs             Wheelchair Mobility     Tilt Bed    Modified Rankin (Stroke Patients Only)       Balance Overall balance assessment: Needs assistance, History of Falls Sitting-balance support: Feet supported, Bilateral upper extremity supported Sitting balance-Leahy Scale: Fair     Standing balance support: During functional activity, Reliant on assistive device for balance Standing balance-Leahy Scale: Poor Standing balance comment: B UE support at RW and CGA with cues                            Communication Communication Communication: No apparent difficulties  Cognition Arousal: Alert Behavior During Therapy: WFL for tasks assessed/performed   PT - Cognitive impairments: No apparent impairments                       PT - Cognition Comments: occasional redirection to task Following commands: Intact      Cueing Cueing Techniques: Verbal cues  Exercises      General Comments        Pertinent Vitals/Pain Pain Assessment Pain Assessment: Faces Faces Pain Scale: Hurts a little bit Breathing: normal Negative Vocalization: none Facial Expression: smiling or inexpressive Body Language: relaxed Consolability: no need to console PAINAD Score: 0 Pain Location: back, generalized Pain Descriptors / Indicators: Grimacing, Guarding Pain Intervention(s): Limited activity within patient's tolerance, Monitored during session, Repositioned    Home Living                          Prior Function            PT Goals (current goals can now be found in the care plan section) Acute Rehab PT Goals Patient Stated Goal: home, family feels pt will need rehab PT Goal Formulation: With patient Time For Goal Achievement: 02/25/24 Potential to Achieve Goals: Fair Progress towards PT goals: Progressing toward goals    Frequency    Min 3X/week      PT Plan       Co-evaluation   Reason for Co-Treatment: For patient/therapist safety;To address functional/ADL transfers PT goals addressed during session: Mobility/safety with mobility;Balance OT goals addressed during session: ADL's and self-care      AM-PAC PT 6 Clicks Mobility   Outcome Measure  Help needed turning from your back to your side while in a flat bed without using bedrails?: A Little Help needed moving from lying on your back to sitting on the side of a flat bed without using bedrails?: A Little Help needed moving to and from a bed to a chair (including a wheelchair)?: A Little Help needed standing up from a chair using your arms (e.g., wheelchair or bedside chair)?: A Little Help needed to walk in hospital room?: A Little Help needed climbing 3-5 steps with a railing? : Total 6 Click Score: 16    End of Session Equipment Utilized During Treatment: Gait belt Activity Tolerance: Patient tolerated treatment well Patient left: in chair;with chair alarm set;with call bell/phone within reach Nurse Communication: Mobility status PT Visit Diagnosis: Other abnormalities of gait and mobility (R26.89)     Time: 8455-8387 PT  Time Calculation (min) (ACUTE ONLY): 28 min  Charges:    $Gait Training: 8-22 mins $Therapeutic Activity: 8-22 mins PT General Charges $$ ACUTE PT VISIT: 1 Visit                     [place], PT Acute Rehab    [place] VEAR Drone 02/20/2024, 7:49 PM

## 2024-02-21 DIAGNOSIS — Z7189 Other specified counseling: Secondary | ICD-10-CM | POA: Diagnosis not present

## 2024-02-21 DIAGNOSIS — S32010A Wedge compression fracture of first lumbar vertebra, initial encounter for closed fracture: Secondary | ICD-10-CM | POA: Diagnosis not present

## 2024-02-21 DIAGNOSIS — N1831 Chronic kidney disease, stage 3a: Secondary | ICD-10-CM | POA: Diagnosis not present

## 2024-02-21 DIAGNOSIS — Z79899 Other long term (current) drug therapy: Secondary | ICD-10-CM

## 2024-02-21 DIAGNOSIS — K5900 Constipation, unspecified: Secondary | ICD-10-CM

## 2024-02-21 DIAGNOSIS — C170 Malignant neoplasm of duodenum: Secondary | ICD-10-CM | POA: Diagnosis not present

## 2024-02-21 DIAGNOSIS — R52 Pain, unspecified: Secondary | ICD-10-CM

## 2024-02-21 DIAGNOSIS — Z515 Encounter for palliative care: Secondary | ICD-10-CM | POA: Diagnosis not present

## 2024-02-21 DIAGNOSIS — D649 Anemia, unspecified: Secondary | ICD-10-CM | POA: Diagnosis not present

## 2024-02-21 HISTORY — DX: Constipation, unspecified: K59.00

## 2024-02-21 HISTORY — DX: Pain, unspecified: R52

## 2024-02-21 HISTORY — DX: Other long term (current) drug therapy: Z79.899

## 2024-02-21 LAB — BASIC METABOLIC PANEL WITH GFR
Anion gap: 11 (ref 5–15)
BUN: 28 mg/dL — ABNORMAL HIGH (ref 8–23)
CO2: 26 mmol/L (ref 22–32)
Calcium: 8.2 mg/dL — ABNORMAL LOW (ref 8.9–10.3)
Chloride: 106 mmol/L (ref 98–111)
Creatinine, Ser: 1.21 mg/dL — ABNORMAL HIGH (ref 0.44–1.00)
GFR, Estimated: 46 mL/min — ABNORMAL LOW (ref >60)
Glucose, Bld: 86 mg/dL (ref 70–99)
Potassium: 3.5 mmol/L (ref 3.5–5.1)
Sodium: 142 mmol/L (ref 135–145)

## 2024-02-21 LAB — CBC
HCT: 32 % — ABNORMAL LOW (ref 36.0–46.0)
Hemoglobin: 8.5 g/dL — ABNORMAL LOW (ref 12.0–15.0)
MCH: 26 pg (ref 26.0–34.0)
MCHC: 26.6 g/dL — ABNORMAL LOW (ref 30.0–36.0)
MCV: 97.9 fL (ref 80.0–100.0)
Platelets: 146 K/uL — ABNORMAL LOW (ref 150–400)
RBC: 3.27 MIL/uL — ABNORMAL LOW (ref 3.87–5.11)
RDW: 17.9 % — ABNORMAL HIGH (ref 11.5–15.5)
WBC: 6.6 K/uL (ref 4.0–10.5)
nRBC: 0 % (ref 0.0–0.2)

## 2024-02-21 MED ORDER — POTASSIUM CHLORIDE CRYS ER 20 MEQ PO TBCR
40.0000 meq | EXTENDED_RELEASE_TABLET | Freq: Once | ORAL | Status: AC
  Administered 2024-02-21: 40 meq via ORAL
  Filled 2024-02-21: qty 2

## 2024-02-21 MED ORDER — HYDROMORPHONE HCL 1 MG/ML IJ SOLN
0.5000 mg | INTRAMUSCULAR | Status: DC | PRN
  Administered 2024-02-24 (×2): 1 mg via INTRAVENOUS
  Filled 2024-02-21 (×3): qty 1

## 2024-02-21 MED ORDER — SENNA 8.6 MG PO TABS
1.0000 | ORAL_TABLET | Freq: Two times a day (BID) | ORAL | Status: DC
  Administered 2024-02-21 (×2): 8.6 mg via ORAL
  Filled 2024-02-21 (×3): qty 1

## 2024-02-21 MED ORDER — ACETAMINOPHEN 500 MG PO TABS: 1000.0000 mg | ORAL_TABLET | Freq: Three times a day (TID) | ORAL | Status: DC

## 2024-02-21 MED ORDER — ACETAMINOPHEN 500 MG PO TABS
1000.0000 mg | ORAL_TABLET | Freq: Three times a day (TID) | ORAL | Status: DC
  Administered 2024-02-21 – 2024-02-25 (×13): 1000 mg via ORAL
  Filled 2024-02-21 (×13): qty 2

## 2024-02-21 MED ORDER — METHOCARBAMOL 500 MG PO TABS: 750.0000 mg | ORAL_TABLET | Freq: Three times a day (TID) | ORAL | Status: DC

## 2024-02-21 MED ORDER — LIDOCAINE 5 % EX PTCH
1.0000 | MEDICATED_PATCH | CUTANEOUS | Status: DC
  Administered 2024-02-21 – 2024-02-25 (×5): 1 via TRANSDERMAL
  Filled 2024-02-21 (×5): qty 1

## 2024-02-21 MED ORDER — HYDROMORPHONE HCL 2 MG PO TABS
1.0000 mg | ORAL_TABLET | ORAL | Status: DC | PRN
  Administered 2024-02-21 – 2024-02-22 (×3): 1 mg via ORAL
  Filled 2024-02-21 (×3): qty 1

## 2024-02-21 MED ORDER — METHOCARBAMOL 500 MG PO TABS
500.0000 mg | ORAL_TABLET | Freq: Three times a day (TID) | ORAL | Status: DC | PRN
  Administered 2024-02-22: 500 mg via ORAL
  Filled 2024-02-21 (×2): qty 1

## 2024-02-21 MED ORDER — LEVALBUTEROL HCL 0.63 MG/3ML IN NEBU: 0.6300 mg | INHALATION_SOLUTION | Freq: Four times a day (QID) | RESPIRATORY_TRACT | Status: DC | PRN

## 2024-02-21 NOTE — Progress Notes (Signed)
 [facility]  PROGRESS NOTE  Peggy Armstrong FMW:[id number] DOB: July 28, 1945 DOA: 02/10/2024 PCP: Peggy [name] DASEN, PA-C   Brief HPI:   78 year old woman complex PMH including inoperable duodenal adenocarcinoma, rheumatoid arthritis, CKD, multiple spontaneous vertebral compression fractures, who presented to the Emergency Department for new back pain. Evaluation was notable for profound anemia and CT showed acute L1 compression fracture. She was admitted for transfusion, pain control. GI was consulted. Hemoglobin stabilized, she eventually elected endoscopy which will be performed tomorrow. She refuses SNF and will discharge home with home health when ready.  Procedures/Events 8/16 admit for severe anemia, new L1 compression fx with acute pain 8/20 s/p EGD:  White nummular lesions in esophageal mucosa.                            Brushings performed to rule out Candida. - Multiple inflammatoryappearing gastric polyps.  - Malignant duodenal mass with mild slow oozing taking over the entirety of the duodenum 2. PuraStat injected. Slow to recover with hospital stay complicated by volume overload and subacute kidney injury.  Plan is for eventual transfer to CIR if insurance approves.   Subjective   Complains of low back pain.  Able to move her extremities.  No other complaints offered.  Wants to go home instead of skilled nursing facility.    Assessment/Plan:   Acute on chronic anemia, macrocytic, asymptomatic -Severe anemia hemoglobin 5.5.  Transfused 2 units 8/16 with appropriate rise.  Hemoglobin remained stable. -GI consultation appreciated.   -8/20 s/p EGD: Oozing duodenal malignant mass, pleural State injected by GI.  Patient remains at high risk for rebleeding if DOAC restarted.   Duodenal adenocarcinoma -Follow-up with primary oncologist in [place] as an outpatient. Patient was seen by general surgery, recommended no surgical intervention, patient is very poor candidate,  recommended radiation therapy.  General surgery will discuss with oncologist Dr. Ezzard and will refer her to radiation oncology.  Patient agreed with this plan -Palliative care consult for goals of care-full scope for now   Shortness of breath and wheezing possible due to acute pulmonary edema/Acute on chronic diastolic CHF exacerbation -Diuresed with Lasix . -TTE LVEF 65%, G1 DD, mild to moderate TR, moderate atrial dilatation.  No any other significant finding Stable.   Hypokalemia due to diuresis Replete  Iron  deficiency, Tsat 4%. IV Venofer  300 mg x 1 dose given Continue oral iron  supplement on discharge   Acute L1 compression fracture On and off pain.  Lidocaine  patch has been ordered.  Patient is on as needed hydromorphone  and oxycodone .  Will initiate scheduled Tylenol  and change Robaxin  to scheduled as well. By PT who recommended CIR versus SNF.  Insurance denied inpatient rehabilitation.  Patient prefers to go home rather than going to SNF.   Chronic thrombocytopenia -Chronic since at least January, stable   Acquired hypothyroidism -Continue levothyroxine    Atypical atrial flutter -Currently on amiodarone , diltiazem  120 mg daily -Holding apixaban .  - Eliquis  has been discontinued.  Patient remains at high risk for rebleeding on DOAC.  Would recommend to follow-up with cardiology to restart DOAC if able, not a candidate for Watchman procedure - Cardiology following   CKD stage IIIa - Trending down status post diuresis Supplement potassium  Hypophosphatemia/hypokalemia - Replete   obesity Body mass index is 38.45 kg/m.    DVT prophylaxis: SCDs Code Status: Full code Family Communication: No family at bedside Disposition: CIR denied by insurance.  Patient prefers to go home  with home health.  Will plan on discharge tomorrow if pain is better controlled.   CONSULTS: Cardiology, GI   Medications     acetaminophen   650 mg Oral Q6H   Or   acetaminophen   650  mg Rectal Q6H   allopurinol   300 mg Oral Daily   amiodarone   200 mg Oral BID   arformoterol   15 mcg Nebulization BID   busPIRone   10 mg Oral BID   cyanocobalamin   1,000 mcg Oral Daily   diltiazem   300 mg Oral Daily   DULoxetine   20 mg Oral Daily   levothyroxine   100 mcg Oral QAC breakfast   lidocaine   1 patch Transdermal Q24H   melatonin  5 mg Oral QHS   pantoprazole   40 mg Oral BID   potassium chloride  SA  20 mEq Oral QHS   sodium chloride  flush  3 mL Intravenous Q12H   sodium chloride  flush  3 mL Intravenous Q12H   spironolactone   12.5 mg Oral Daily   sucralfate   1 g Oral BID     Objective    Vitals:   02/20/24 2120 02/21/24 0500 02/21/24 0611 02/21/24 0904  BP: 139/63  (!) 115/52 120/60  Pulse: 65  (!) 57   Resp: 18  16   Temp: 98 F (36.7 C)  97.9 F (36.6 C)   TempSrc: Oral  Oral   SpO2: 97%  99%   Weight:  87.7 kg    Height:        General appearance: Awake alert.  In no distress Resp: Clear to auscultation bilaterally.  Normal effort Cardio: S1-S2 is normal regular.  No S3-S4.  No rubs murmurs or bruit GI: Abdomen is soft.  Nontender nondistended.  Bowel sounds are present normal.  No masses organomegaly Extremities: No edema.  Physical deconditioning noted. Neurologic: Alert and oriented x3.  No focal neurological deficits.     Data Reviewed:  Basic Metabolic Panel: Recent Labs  Lab 02/15/24 0503 02/16/24 0448 02/17/24 0541 02/18/24 0437 02/19/24 0516 02/20/24 0450 02/21/24 0509  NA 137 139 140 140 139 140 142  K 3.6 3.1* 4.0 3.9 3.8 3.2* 3.5  CL 104 103 102 104 107 103 106  CO2 23 28 29 27 25 28 26   GLUCOSE 117* 120* 107* 107* 78 83 86  BUN 20 28* 37* 40* 38* 34* 28*  CREATININE 0.94 0.98 1.27* 1.35* 1.43* 1.33* 1.21*  CALCIUM  8.1* 8.3* 8.5* 8.4* 8.0* 8.0* 8.2*  MG 1.8 1.8 1.8  --   --  1.7  --   PHOS 3.5 2.7 2.1* 3.4  --   --   --     CBC: Recent Labs  Lab 02/17/24 0541 02/18/24 0437 02/19/24 0516 02/20/24 0450 02/21/24 0509  WBC  6.5 5.6 5.9 5.8 6.6  HGB 8.4* 8.4* 8.4* 8.8* 8.5*  HCT 30.0* 30.7* 30.0* 31.6* 32.0*  MCV 95.8 97.8 98.7 98.4 97.9  PLT 203 190 170 164 146*     Peggy Armstrong   [facility] If 7PM-7AM, please contact night-coverage at [email or web address], Office  [phone]   02/21/2024, 11:09 AM  LOS: 10 days

## 2024-02-21 NOTE — TOC Progression Note (Signed)
 Transition of Care [facility]) - Progression Note    Patient Details  Name: Peggy Armstrong MRN: [id number] Date of Birth: 12/12/1945  Transition of [facility]) CM/SW Contact  [name], RN Phone Number: 02/21/2024, 9:52 AM  Clinical Narrative:   Per CIR note 8/26-received a denial for CIR , family notified.     Expected Discharge Plan: Home w Home Health Services Barriers to Discharge: Continued Medical Work up               Expected Discharge Plan and Services       Living arrangements for the past 2 months: Apartment                                       Social Drivers of Health (SDOH) Interventions SDOH Screenings   Food Insecurity: No Food Insecurity (02/10/2024)  Housing: Low Risk  (02/10/2024)  Transportation Needs: No Transportation Needs (02/10/2024)  Utilities: Not At Risk (02/10/2024)  Depression (PHQ2-9): Low Risk  (12/22/2023)  Financial Resource Strain: Low Risk  (12/02/2022)   Received from [facility]  Physical Activity: Unknown (04/05/2022)   [facility] from [facility]  Social Connections: Moderately Isolated (02/10/2024)  Stress: No Stress Concern Present (04/05/2022)   Received from [facility]  Tobacco Use: Medium Risk (02/14/2024)    Readmission Risk Interventions    02/12/2024    2:43 PM 07/24/2023    5:23 PM 05/14/2023    6:44 PM  Readmission Risk Prevention Plan  Transportation Screening Complete Complete Complete  PCP or Specialist Appt within 3-5 Days Complete Complete Complete  HRI or Home Care Consult Complete Complete Complete  Social Work Consult for Recovery Care Planning/Counseling  Complete Complete  Palliative Care Screening Not Applicable Not Applicable Complete  Medication Review [facility]) Complete Referral to Pharmacy Referral to Pharmacy

## 2024-02-21 NOTE — Progress Notes (Signed)
 Inpatient Rehab Admissions Coordinator:    I spoke with Pt. And daughter regarding CIR denial. Daughter would like short term rehab at [facility] but Pt. Prefers home. TOC to discuss with pt  Leita Kleine, MS, CCC-SLP Rehab Admissions Coordinator  [phone] (celll) [phone] (office)

## 2024-02-21 NOTE — Progress Notes (Signed)
 Physical Therapy Treatment Patient Details Name: Peggy Armstrong MRN: [id number] DOB: March 25, 1946 Today's Date: 02/21/2024   History of Present Illness 78 y.o. female who presented to the emergency department 02/10/24 with profound fatigue and back pain. Pt found to have hemoglobin 5.5 and positive fecal occult blood. EGD on 8/20. CT= Acute L1 compression fracture.  2. Mild L3 compression fracture with progressive height loss since  10/20/2023.  3. 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 GI consulted. Pt with extensive medical history including but not limited to congestive heart failure, A-fib on Eliquis , AAA, HTN, chronic back pain, history of DVT, CKD and ampullary adenocarcinoma, L humerus fx, RA, L elbow avulsion fx, LLE fx    PT Comments  Gabryel agreeable to only light activity today, limited by pain and fatigue despite being premedicated. Pt is able to verbalize back 3/3 precautions however demonstrates limited ability to transfer this to functional tasks and requires cues throughout to maintain.  Min assist to log roll and transition to EOB, mod assist for sit to supine. Pt will benefit from post acute rehab/ follow up therapy, <3 hours/day. Continue PT in acute setting    If plan is discharge home, recommend the following: Help with stairs or ramp for entrance;Assist for transportation;Assistance with cooking/housework;A lot of help with walking and/or transfers;A lot of help with bathing/dressing/bathroom   Can travel by private vehicle     No  Equipment Recommendations  None recommended by PT    Recommendations for Other Services       Precautions / Restrictions Precautions Precautions: Fall;Back Recall of Precautions/Restrictions: Impaired Precaution/Restrictions Comments: recalls 3/3 spinal precautions start of session; cannot verbalize how to functionally observe precautions with ADL and mobility tasks Required Braces or Orthoses:  Spinal Brace Spinal Brace: Thoracolumbosacral orthotic;Applied in sitting position Restrictions Weight Bearing Restrictions Per Provider Order: No     Mobility  Bed Mobility   Bed Mobility: Rolling, Sidelying to Sit, Sit to Sidelying Rolling: Min assist Sidelying to sit: Min assist     Sit to sidelying: Mod assist General bed mobility comments: cues for log roll technique, incr time    Transfers                   General transfer comment: pt declined d/t pain    Ambulation/Gait               General Gait Details:  (declined d/t pain)   Stairs             Wheelchair Mobility     Tilt Bed    Modified Rankin (Stroke Patients Only)       Balance   Sitting-balance support: Feet supported, Single extremity supported, No upper extremity supported Sitting balance-Leahy Scale: Fair Sitting balance - Comments: poor tolerance to upright; pt reports increased pain and requesting to return to supine. Postural control: Posterior lean                                  Communication Communication Communication: No apparent difficulties  Cognition Arousal: Alert Behavior During Therapy: WFL for tasks assessed/performed   PT - Cognitive impairments: No apparent impairments                         Following commands: Intact      Cueing Cueing Techniques: Verbal cues  Exercises  General Comments General comments (skin integrity, edema, etc.): on 2.5L O2 with VSS      Pertinent Vitals/Pain Pain Assessment Pain Assessment: 0-10 Pain Score: 6  Pain Intervention(s): Limited activity within patient's tolerance, Monitored during session, Premedicated before session    Home Living                          Prior Function            PT Goals (current goals can now be found in the care plan section) Acute Rehab PT Goals Patient Stated Goal: home, family feels pt will need rehab PT Goal Formulation: With  patient Time For Goal Achievement: 02/25/24 Potential to Achieve Goals: Fair Progress towards PT goals: Progressing toward goals    Frequency    Min 3X/week      PT Plan      Co-evaluation              AM-PAC PT 6 Clicks Mobility   Outcome Measure  Help needed turning from your back to your side while in a flat bed without using bedrails?: A Little Help needed moving from lying on your back to sitting on the side of a flat bed without using bedrails?: A Little Help needed moving to and from a bed to a chair (including a wheelchair)?: A Little Help needed standing up from a chair using your arms (e.g., wheelchair or bedside chair)?: A Little Help needed to walk in hospital room?: A Little Help needed climbing 3-5 steps with a railing? : Total 6 Click Score: 16    End of Session   Activity Tolerance: Patient limited by fatigue Patient left: in bed;with call bell/phone within reach;with bed alarm set Nurse Communication: Mobility status PT Visit Diagnosis: Other abnormalities of gait and mobility (R26.89)     Time: 8585-8562 PT Time Calculation (min) (ACUTE ONLY): 23 min  Charges:    $Therapeutic Activity: 23-37 mins PT General Charges $$ ACUTE PT VISIT: 1 Visit                     [place], PT  Acute Rehab Dept [facility]) [phone]  02/21/2024    [facility] 02/21/2024, 2:52 PM

## 2024-02-21 NOTE — Progress Notes (Signed)
 Occupational Therapy Treatment Patient Details Name: Peggy Armstrong MRN: [id number] DOB: 1946/06/06 Today's Date: 02/21/2024   History of present illness 78 y.o. female who presented to the emergency department 02/10/24 with profound fatigue and back pain. Pt found to have hemoglobin 5.5 and positive fecal occult blood. EGD on 8/20. CT= Acute L1 compression fracture.  2. Mild L3 compression fracture with progressive height loss since  10/20/2023.  3. 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 GI consulted. Pt with extensive medical history including but not limited to congestive heart failure, A-fib on Eliquis , AAA, HTN, chronic back pain, history of DVT, CKD and ampullary adenocarcinoma, L humerus fx, RA, L elbow avulsion fx, LLE fx   OT comments  Pt seen for OT treatment session. Pt with new O2 needs and wheezing intermittently. Pt agreeable to work on seated LB dressing, requires MIN A to perform log roll technique to transition from supine > side-lying > sitting. Pt limited by pain this date; tolerates sitting at EOB for only a short period before requesting to return to supine, MAX A to transition. Discharge recommendation update to STR, pt is not at functional baseline and demonstrates deficits in strength, balance, mobility and tolerance to activity which impacts safe performance of all ADL and mobility. Pt is a falls risk, and will continue to require reinforcement of spinal precautions for functional tasks. OT to follow acutely, patient will benefit from continued inpatient follow up therapy, <3 hours/day       If plan is discharge home, recommend the following:  Assistance with cooking/housework;Assist for transportation;Help with stairs or ramp for entrance;Two people to help with walking and/or transfers;A lot of help with bathing/dressing/bathroom   Equipment Recommendations  None recommended by OT       Precautions / Restrictions  Precautions Precautions: Fall;Back Recall of Precautions/Restrictions: Impaired Precaution/Restrictions Comments: recalls 3/3 spinal precautions start of session; cannot verbalize how to functionally observe with ADL and mobility tasks Required Braces or Orthoses: Spinal Brace Spinal Brace: Thoracolumbosacral orthotic;Applied in sitting position Restrictions Weight Bearing Restrictions Per Provider Order: No       Mobility Bed Mobility Overal bed mobility: Needs Assistance Bed Mobility: Rolling, Sidelying to Sit, Sit to Sidelying Rolling: Min assist Sidelying to sit: Min assist Supine to sit: Min assist   Sit to sidelying: Max assist General bed mobility comments: cues for log roll tehnique    Transfers Overall transfer level: Needs assistance                 General transfer comment: NT     Balance Overall balance assessment: Needs assistance, History of Falls Sitting-balance support: Feet supported, Bilateral upper extremity supported Sitting balance-Leahy Scale: Fair Sitting balance - Comments: poor tolerance to upright; pt reports increased pain and requesting to return to supine.                                   ADL either performed or assessed with clinical judgement   ADL Overall ADL's : Needs assistance/impaired                                       General ADL Comments: Pt limited by fatigue and pain. Limited tolerance to sitting upright this date.     Communication Communication Communication: No apparent difficulties  Cognition Arousal: Alert Behavior During Therapy: WFL for tasks assessed/performed Cognition: No apparent impairments             OT - Cognition Comments: decreased recall of precautions, decreased insight                 Following commands: Intact        Cueing   Cueing Techniques: Verbal cues        General Comments Pt with wheezing, HR 50s, SpO2 99% on 2.5L via [place]     Pertinent Vitals/ Pain       Pain Assessment Pain Assessment: 0-10 Pain Score: 4  Pain Location: back, pain 4/10 start of session, increases to 6/10 with mobility Pain Descriptors / Indicators: Grimacing, Guarding Pain Intervention(s): Limited activity within patient's tolerance, Monitored during session, Patient requesting pain meds-RN notified, RN gave pain meds during session, Repositioned   Frequency  Min 2X/week        Progress Toward Goals  OT Goals(current goals can now be found in the care plan section)  Progress towards OT goals: Progressing toward goals  Acute Rehab OT Goals OT Goal Formulation: With patient Time For Goal Achievement: 03/04/24 Potential to Achieve Goals: Good ADL Goals Pt Will Perform Upper Body Bathing: with contact guard assist;with adaptive equipment;sitting Pt Will Perform Lower Body Bathing: with contact guard assist;with caregiver independent in assisting;with adaptive equipment;sitting/lateral leans;sit to/from stand Pt Will Perform Upper Body Dressing: with contact guard assist;with adaptive equipment;sitting Pt Will Perform Lower Body Dressing: with contact guard assist;with adaptive equipment;sitting/lateral leans;sit to/from stand Pt Will Transfer to Toilet: with contact guard assist;ambulating;bedside commode Pt Will Perform Toileting - Clothing Manipulation and hygiene: sit to/from stand;sitting/lateral leans;with adaptive equipment  Plan         AM-PAC OT 6 Clicks Daily Activity     Outcome Measure   Help from another person eating meals?: None Help from another person taking care of personal grooming?: A Little Help from another person toileting, which includes using toliet, bedpan, or urinal?: A Lot Help from another person bathing (including washing, rinsing, drying)?: A Lot Help from another person to put on and taking off regular upper body clothing?: A Lot Help from another person to put on and taking off regular lower  body clothing?: A Lot 6 Click Score: 15    End of Session Equipment Utilized During Treatment: Oxygen  OT Visit Diagnosis: Muscle weakness (generalized) (M62.81);History of falling (Z91.81);Pain;Unsteadiness on feet (R26.81)   Activity Tolerance Patient limited by pain   Patient Left in bed;with call bell/phone within reach;with nursing/sitter in room;with bed alarm set   Nurse Communication Mobility status;Patient requests pain meds        Time: 1300-1320 OT Time Calculation (min): 20 min  Charges: OT General Charges $OT Visit: 1 Visit OT Treatments $Self Care/Home Management : 8-22 mins  Ladarrell Cornwall L. Shaolin Armas, OTR/L  02/21/24, 1:32 PM

## 2024-02-21 NOTE — Progress Notes (Signed)
 Daily Progress Note   Patient Name: Peggy Armstrong       Date: 02/21/2024 DOB: [birth year]  Age: 78 y.o. MRN#: [id number] Attending Physician: Verdene Purchase, MD Primary Care Physician: Iven [name] [name] Admit Date: 02/10/2024 Length of Stay: 10 days  Reason for Consultation/Follow-up: Establishing goals of care  Subjective:    Reviewed EMR including recent documentation from hospitalist, TOC, and PT/OT.  Patient denied from CIR.  Patient considering home with home health versus skilled nursing facility for rehab. At time of EMR review in past 24 hours patient has received as needed IV Dilaudid  1 mg x 1 dose.  Patient has not taken any as needed oxycodone .  Presented to bedside to see patient.  No visitors present at bedside.  Patient lying comfortably in bed.  Again introduced myself as a member of the palliative medicine team.  Able to follow with the patient regarding planning and symptom management today.  Patient noted that she had worsening pain in her lower back requiring the IV Dilaudid .  Discussed use of oral pain medicines to assist moving forward so can take medications outside of the hospital.  Patient noted that she did not want to take the as needed oxycodone  because she feels that it makes her hallucinate.  Acknowledged this.  Noted avoiding as needed morphine  due to kidney dysfunction.  Discussed starting as needed low-dose oral Dilaudid  to assist with pain management.  Patient agreement with this plan.  Discussed how muscle relaxants are not the most appropriate medication for her type of pain and can have adverse effects in the geriatric population.  Noted would change muscle relaxant to as needed.  Hospitalist has also already scheduled Tylenol  which patient agrees with.  In terms of planning moving forward, patient still wants to follow-up with her oncologist to further discuss cancer directed therapies.  Patient notes she has been instructed that she cannot receive  chemotherapy though she may be a candidate for oral cancer directed therapies.  Discussed if patient's hope is to discuss further cancer therapies that are available with oncologist, would recommend that patient needs to regain strength as needs to be strong enough to tolerate cancer directed therapies.  Patient acknowledged this.  Patient noted she is willing to consider rehab at a facility at this time.  Discussing with TOC to coordinate options.  Though patient wants to be able to go home, she wants to regain strength to pursue cancer directed therapies more.  Acknowledged this.  Spent time providing emotional support via active listening.  Noted palliative medicine team will continue to follow along with patient's medical journey.  Objective:   Vital Signs:  BP (!) 115/52 (BP Location: Right Arm)   Pulse (!) 57   Temp 97.9 F (36.6 C) (Oral)   Resp 16   Ht 5' (1.524 m)   Wt 87.7 kg   SpO2 99%   BMI 37.76 kg/m   Physical Exam: General: NAD, alert, pleasant, elderly Cardiovascular: RRR Respiratory: no increased work of breathing noted, not in respiratory distress Neuro: A&Ox4, following commands easily Psych: appropriately answers all questions  Assessment & Plan:   Assessment: Patient is a 78 year old female with a past medical history of inoperable duodenal adenocarcinoma, rheumatoid arthritis, CKD, and multiple spontaneous vertebral compression fractures who was admitted on 02/10/2024 for new back pain.  During admission patient found to have acute L1 fracture as well as profound anemia.  GI consulted and patient's status post EGD.  Palliative medicine team consulted to assist  with complex medical decision making.  Recommendations/Plan: # Complex medical decision making/goals of care:  - Patient has already expressed wishes to receive full scope of appropriate medical care and to remain full code.  Patient continues to express desire to seek further cancer directed therapies if  offered.  Encouraged further conversations with oncologist regarding planning for this.  Also noted patient would have to be strong enough to tolerate cancer directed therapies.  Patient now considering skilled nursing facility for rehab.  Palliative medicine team continuing to follow along to engage in conversations as able and appropriate.  - Patient's reported HCPOA is daughter, Peggy Armstrong.  ACP documentation not found in EMR.  -  Code Status: Full Code  # Symptom management: Patient is receiving these palliative interventions for symptom management with an intent to improve quality of life.   - Pain, in setting of inoperable duodenal adenocarcinoma and acute L1 fracture   - Agree with Tylenol  1000 mg every 8 hours during the day   - Start oral Dilaudid  1 mg every 4 hours as needed.  Not using oral morphine  due to kidney dysfunction (and shortage of medications).  Patient notes unable to tolerate oxycodone  as causes hallucinations.   - Change IV Dilaudid  to 0.5-1 mg every 2 hours as needed breakthrough pain uncontrolled by oral Dilaudid .   - Change Robaxin  to 500 mg every 8 hours as needed.  - Start senna 1 tab twice daily as patient needs to be on bowel regimen while receiving opioids.  # Discharge Planning: To Be Determined - TOC assisting with coordination of discharge planning.  Considering SNF for rehab.  Thank you for allowing the palliative care team to participate in the care [name].  Tinnie Radar, DO Palliative Care Provider PMT # [phone]  If patient remains symptomatic despite maximum doses, please call PMT at [phone] between 0700 and 1900. Outside of these hours, please call attending, as PMT does not have night coverage.  Personally spent 50 minutes in patient care including extensive chart review (labs, imaging, progress/consult notes, vital signs), medically appropraite exam, discussed with treatment team, education to patient, family, and staff,  documenting clinical information, medication review and management, coordination of care, and available advanced directive documents.

## 2024-02-21 NOTE — Progress Notes (Signed)
 OT Cancellation Note  Patient Details Name: Peggy Armstrong MRN: [id number] DOB: January 05, 1946   Cancelled Treatment:    Reason Eval/Treat Not Completed: Fatigue/lethargy limiting ability to participate. Pt received in bed, on O2 via [place], and politely requests that OT return after lunch as she is too sleepy to work with therapy this AM. OT will check back as able and follow acutely.   Trent Theisen L. Derius Ghosh, OTR/L  02/21/24, 10:19 AM

## 2024-02-21 NOTE — NC FL2 (Signed)
 [place]  MEDICAID FL2 LEVEL OF CARE FORM     IDENTIFICATION  Patient Name: Peggy Armstrong Birthdate: [birth year] Sex: female Admission Date (Current Location): 02/10/2024  [facility] and [place] Number:  Producer, television/film/video and Address:  [facility],  [address] [place], [place] [address]      Provider Number: [id number]  Attending Physician Name and Address:  Verdene Purchase, MD  Relative Name and Phone Number:  Deonna, Krummel (Daughter)  [phone] [facility])    Current Level of Care: Hospital Recommended Level of Care: Skilled Nursing Facility Prior Approval Number:    Date Approved/Denied:   PASRR Number: [id number]  Discharge Plan: SNF    Current Diagnoses: Patient Active Problem List   Diagnosis Date Noted   Goals of care, counseling/discussion 02/20/2024   Palliative care encounter 02/20/2024   Anemia due to blood loss, acute 02/15/2024   IDA (iron  deficiency anemia) 02/15/2024   Anemia 02/14/2024   Mucosal abnormality of esophagus 02/14/2024   Chronic anticoagulation 02/12/2024   Acute on chronic anemia 02/12/2024   Severe anemia 02/10/2024   Closed compression fracture of body of L1 vertebra (HCC) 02/10/2024   Lumbar radiculopathy 12/15/2023   Acquired hypothyroidism    Fall at home, initial encounter 10/21/2023   Left humeral fracture 10/21/2023   Left elbow avulsion fracture 10/21/2023   History of GI bleed 10/21/2023   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   History of depression 10/21/2023   Atrial fibrillation (HCC)    Back injury    DVT (deep venous thrombosis) (HCC)    Hypertension    Cellulitis of left arm 08/14/2023   Hemorrhagic shock (HCC) 07/21/2023   ABLA (acute blood loss anemia) 07/21/2023   Acute GI bleeding 07/21/2023   AKI (acute kidney injury) (HCC) 07/21/2023   GI bleed 07/20/2023   Chronic low back pain 07/18/2023   Upper GI bleed 06/20/2023   Long term current use of anticoagulant therapy 06/20/2023   Occult  blood in stools 05/16/2023   Gastric polyps 05/16/2023   Generalized weakness 05/12/2023   Acute cystitis 05/12/2023   Dehydration 05/12/2023   Acute prerenal azotemia 05/12/2023   Paroxysmal atrial fibrillation (HCC) 05/12/2023   AAA (abdominal aortic aneurysm) (HCC) 05/12/2023   Hyperlipidemia 05/12/2023   GAD (generalized anxiety disorder) 05/12/2023   Hypothyroidism 05/12/2023   Closed compression fracture of L2 lumbar vertebra, initial encounter (HCC) 05/11/2023   Restless leg 12/02/2022   Routine general medical examination at a health care facility 12/13/2019   Chronic diastolic CHF (congestive heart failure) (HCC) 11/08/2019   Morbid obesity (HCC) 11/08/2019   History of DVT (deep vein thrombosis) 11/08/2019   Need for immunization against influenza 07/18/2019   Right shoulder pain 07/26/2018   Need for vaccination 04/23/2018   Duodenal ulcer    Hematemesis 11/05/2017   Macrocytic anemia 11/05/2017   Melena 11/05/2017   Nausea & vomiting    Hypokalemia 07/03/2017   Essential hypertension    Gout    Clotting disorder (HCC)    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   CKD (chronic kidney disease) stage 3, GFR 30-59 ml/min (HCC) 06/09/2017   CKD (chronic kidney disease), stage III (HCC) 06/09/2017   S/P ERCP 05/05/2017   Peri-ampullary neoplasm    Atypical atrial flutter (HCC)    Duodenal mass    Cholangitis    Epigastric pain    Choledocholithiasis 03/22/2017   SIRS (systemic inflammatory response syndrome) (HCC) 03/22/2017   Poor dentition 08/14/2016   Iron  deficiency anemia 08/02/2016  RA (rheumatoid arthritis) (HCC) 09/07/2015    Orientation RESPIRATION BLADDER Height & Weight     Self, Time, Situation, Place  O2 Incontinent Weight: 87.7 kg Height:  5' (152.4 cm)  BEHAVIORAL SYMPTOMS/MOOD NEUROLOGICAL BOWEL NUTRITION STATUS      Continent Diet (heart diet)  AMBULATORY STATUS COMMUNICATION OF NEEDS Skin   Limited Assist Verbally Normal                        Personal Care Assistance Level of Assistance  Bathing, Feeding, Dressing Bathing Assistance: Limited assistance Feeding assistance: Limited assistance Dressing Assistance: Limited assistance     Functional Limitations Info  Sight, Hearing, Speech Sight Info: Impaired (eyeglasses) Hearing Info: Adequate Speech Info: Adequate    SPECIAL CARE FACTORS FREQUENCY  PT (By licensed PT), OT (By licensed OT)     PT Frequency: 5x/wk OT Frequency: 5x/wk            Contractures Contractures Info: Not present    Additional Factors Info  Code Status, Allergies, Psychotropic Code Status Info: Full Code Allergies Info: Codeine, Tape Psychotropic Info: Buspar  10mg  po BID,         Current Medications (02/21/2024):  This is the current hospital active medication list Current Facility-Administered Medications  Medication Dose Route Frequency Provider Last Rate Last Admin   acetaminophen  (TYLENOL ) tablet 1,000 mg  1,000 mg Oral TID Krishnan, Gokul, MD       allopurinol  (ZYLOPRIM ) tablet 300 mg  300 mg Oral Daily Jadine Toribio SQUIBB, MD   300 mg at 02/21/24 9094   amiodarone  (PACERONE ) tablet 200 mg  200 mg Oral BID Croitoru, Mihai, MD   200 mg at 02/21/24 9094   arformoterol  (BROVANA ) nebulizer solution 15 mcg  15 mcg Nebulization BID Von Bellis, MD   15 mcg at 02/21/24 0732   busPIRone  (BUSPAR ) tablet 10 mg  10 mg Oral BID Jadine Toribio SQUIBB, MD   10 mg at 02/21/24 9095   cyanocobalamin  (VITAMIN B12) tablet 1,000 mcg  1,000 mcg Oral Daily Jadine Toribio SQUIBB, MD   1,000 mcg at 02/21/24 9096   diltiazem  (CARDIZEM  CD) 24 hr capsule 300 mg  300 mg Oral Daily Nishan, Peter C, MD   300 mg at 02/20/24 9089   diphenhydrAMINE  (BENADRYL ) capsule 25 mg  25 mg Oral Q6H PRN Jadine Toribio SQUIBB, MD   25 mg at 02/14/24 1713   DULoxetine  (CYMBALTA ) DR capsule 20 mg  20 mg Oral Daily Goodrich, Daniel P, MD   20 mg at 02/21/24 9093   HYDROmorphone  (DILAUDID ) injection 0.5-1 mg  0.5-1 mg  Intravenous Q2H PRN Clayton Tinnie ORN, DO       HYDROmorphone  (DILAUDID ) tablet 1 mg  1 mg Oral Q4H PRN Mims, Lauren W, DO       levalbuterol  (XOPENEX ) nebulizer solution 0.63 mg  0.63 mg Nebulization Q6H PRN Krishnan, Gokul, MD       levothyroxine  (SYNTHROID ) tablet 100 mcg  100 mcg Oral QAC breakfast Jadine Toribio SQUIBB, MD   100 mcg at 02/21/24 0617   lidocaine  (LIDODERM ) 5 % 1 patch  1 patch Transdermal Q24H Krishnan, Gokul, MD       melatonin tablet 5 mg  5 mg Oral QHS Vann, Jessica U, DO   5 mg at 02/20/24 2123   methocarbamol  (ROBAXIN ) tablet 500 mg  500 mg Oral Q8H PRN Clayton Tinnie ORN, DO       ondansetron  (ZOFRAN ) tablet 4 mg  4 mg Oral Q6H  PRN Jadine Toribio SQUIBB, MD       Or   ondansetron  (ZOFRAN ) injection 4 mg  4 mg Intravenous Q6H PRN Jadine Toribio SQUIBB, MD       pantoprazole  (PROTONIX ) EC tablet 40 mg  40 mg Oral BID Mansouraty, Gabriel Jr., MD   40 mg at 02/21/24 9094   potassium chloride  SA (KLOR-CON  M) CR tablet 20 mEq  20 mEq Oral QHS Von Bellis, MD   20 mEq at 02/20/24 2123   sodium chloride  flush (NS) 0.9 % injection 3 mL  3 mL Intravenous Q12H Jadine Toribio SQUIBB, MD   3 mL at 02/21/24 0912   sodium chloride  flush (NS) 0.9 % injection 3 mL  3 mL Intravenous Q12H Jadine Toribio SQUIBB, MD   3 mL at 02/21/24 0912   sodium chloride  flush (NS) 0.9 % injection 3 mL  3 mL Intravenous PRN Jadine Toribio SQUIBB, MD   3 mL at 02/10/24 2124   spironolactone  (ALDACTONE ) tablet 12.5 mg  12.5 mg Oral Daily Haley, Sheng L, PA-C   12.5 mg at 02/21/24 9093   sucralfate  (CARAFATE ) tablet 1 g  1 g Oral BID Mansouraty, Gabriel Jr., MD   1 g at 02/21/24 9094     Discharge Medications: Please see discharge summary for a list of discharge medications.  Relevant Imaging Results:  Relevant Lab Results:   Additional Information SSN: [id number]  [name], RN

## 2024-02-21 NOTE — Plan of Care (Signed)

## 2024-02-22 DIAGNOSIS — S32010A Wedge compression fracture of first lumbar vertebra, initial encounter for closed fracture: Secondary | ICD-10-CM | POA: Diagnosis not present

## 2024-02-22 DIAGNOSIS — D649 Anemia, unspecified: Secondary | ICD-10-CM | POA: Diagnosis not present

## 2024-02-22 DIAGNOSIS — Z79899 Other long term (current) drug therapy: Secondary | ICD-10-CM | POA: Diagnosis not present

## 2024-02-22 DIAGNOSIS — Z515 Encounter for palliative care: Secondary | ICD-10-CM | POA: Diagnosis not present

## 2024-02-22 DIAGNOSIS — R52 Pain, unspecified: Secondary | ICD-10-CM | POA: Diagnosis not present

## 2024-02-22 DIAGNOSIS — Z7189 Other specified counseling: Secondary | ICD-10-CM | POA: Diagnosis not present

## 2024-02-22 MED ORDER — HYDROMORPHONE HCL 2 MG PO TABS
2.0000 mg | ORAL_TABLET | ORAL | Status: DC | PRN
  Administered 2024-02-22: 2 mg via ORAL
  Filled 2024-02-22 (×2): qty 1

## 2024-02-22 MED ORDER — SENNOSIDES-DOCUSATE SODIUM 8.6-50 MG PO TABS
2.0000 | ORAL_TABLET | Freq: Two times a day (BID) | ORAL | Status: DC
  Administered 2024-02-22 – 2024-02-24 (×6): 2 via ORAL
  Filled 2024-02-22 (×5): qty 2

## 2024-02-22 MED ORDER — POLYETHYLENE GLYCOL 3350 17 G PO PACK
17.0000 g | PACK | Freq: Every day | ORAL | Status: DC
  Administered 2024-02-22 – 2024-02-24 (×3): 17 g via ORAL
  Filled 2024-02-22 (×3): qty 1

## 2024-02-22 NOTE — Progress Notes (Signed)
 Mobility Specialist - Progress Note   02/22/24 0953  Mobility  Activity Ambulated with assistance  Level of Assistance Minimal assist, patient does 75% or more  Assistive Device Front wheel walker  Distance Ambulated (ft) 50 ft  Range of Motion/Exercises Active  Activity Response Tolerated well  Mobility Referral Yes  Mobility visit 1 Mobility  Mobility Specialist Start Time (ACUTE ONLY) [id number]  Mobility Specialist Stop Time (ACUTE ONLY) 0953  Mobility Specialist Time Calculation (min) (ACUTE ONLY) 28 min   Pt was found in bed and agreeable to ambulate. Assisted with donning TLSO brace. Grew fatigued with session. Returned to recliner chair with all needs met. Call bell in reach.  Erminio Leos,  Mobility Specialist Can be reached via Secure Chat

## 2024-02-22 NOTE — Progress Notes (Signed)
 [facility]  PROGRESS NOTE  Peggy Armstrong FMW:[id number] DOB: Feb 09, 1946 DOA: 02/10/2024 PCP: Iven [name] DASEN, PA-C   Brief HPI:   78 year old woman complex PMH including inoperable duodenal adenocarcinoma, rheumatoid arthritis, CKD, multiple spontaneous vertebral compression fractures, who presented to the Emergency Department for new back pain. Evaluation was notable for profound anemia and CT showed acute L1 compression fracture. She was admitted for transfusion, pain control. GI was consulted. Hemoglobin stabilized, she eventually elected endoscopy which will be performed tomorrow. She refuses SNF and will discharge home with home health when ready.  Procedures/Events 8/16 admit for severe anemia, new L1 compression fx with acute pain 8/20 s/p EGD:  White nummular lesions in esophageal mucosa.                            Brushings performed to rule out Candida. - Multiple inflammatoryappearing gastric polyps.  - Malignant duodenal mass with mild slow oozing taking over the entirety of the duodenum 2. PuraStat injected. Slow to recover with hospital stay complicated by volume overload and subacute kidney injury.  Plan is for eventual transfer to CIR if insurance approves.   Subjective   Patient mentions that her back pain is better but still persist.  No other complaints offered at this time.  She has changed her mind and is now willing to go to skilled nursing facility for rehab.  Does complain of constipation.      Assessment/Plan:   Acute on chronic anemia, macrocytic, asymptomatic -Severe anemia hemoglobin 5.5.  Transfused 2 units 8/16 with appropriate rise.  Hemoglobin remained stable. -GI consultation appreciated.   -8/20 s/p EGD: Oozing duodenal malignant mass, pleural State injected by GI.  Patient remains at high risk for rebleeding if DOAC restarted.   Duodenal adenocarcinoma -Follow-up with primary oncologist in [place] as an outpatient. Patient was seen by  general surgery, recommended no surgical intervention, patient is very poor candidate, recommended radiation therapy.  General surgery will discuss with oncologist Dr. Ezzard and will refer her to radiation oncology.  Patient agreed with this plan -Palliative care consult for goals of care-full scope for now   Shortness of breath and wheezing possible due to acute pulmonary edema/Acute on chronic diastolic CHF exacerbation -Diuresed with Lasix . -TTE LVEF 65%, G1 DD, mild to moderate TR, moderate atrial dilatation.  No any other significant finding Stable.   Hypokalemia/hypophosphatemia Supplemented.  Continue daily potassium.    Iron  deficiency, Tsat 4%. IV Venofer  300 mg x 1 dose given Continue oral iron  supplement on discharge   Acute L1 compression fracture On and off pain.  Lidocaine  patch has been ordered.  Seen by palliative care and they have also made changes to her medication regimen.  She is on Tylenol  around-the-clock.  She is on hydromorphone  orally on a as needed basis.  She is also on Robaxin  on as-needed basis.   Patient was seen by PT who recommended CIR versus SNF.  Insurance denied inpatient rehabilitation.  Initially patient was reluctant to go to SNF but now seems to be agreeable.  TOC is following.     Chronic thrombocytopenia -Chronic since at least January, stable   Acquired hypothyroidism -Continue levothyroxine    Atypical atrial flutter -Currently on amiodarone , diltiazem  120 mg daily -Holding apixaban .  - Eliquis  has been discontinued.  Patient remains at high risk for rebleeding on DOAC.  Would recommend to follow-up with cardiology to restart DOAC if able, not a candidate for Watchman procedure -  Cardiology following   CKD stage IIIa Renal function is stable.  Obesity Body mass index is 38.45 kg/m.    DVT prophylaxis: SCDs Code Status: Full code Family Communication: No family at bedside Disposition: CIR denied by insurance.  Patient now agreeable  to go to SNF.  TOC is following.   CONSULTS: Cardiology, GI   Medications:   acetaminophen   1,000 mg Oral Q8H   allopurinol   300 mg Oral Daily   amiodarone   200 mg Oral BID   arformoterol   15 mcg Nebulization BID   busPIRone   10 mg Oral BID   cyanocobalamin   1,000 mcg Oral Daily   diltiazem   300 mg Oral Daily   DULoxetine   20 mg Oral Daily   levothyroxine   100 mcg Oral QAC breakfast   lidocaine   1 patch Transdermal Q24H   melatonin  5 mg Oral QHS   pantoprazole   40 mg Oral BID   potassium chloride  SA  20 mEq Oral QHS   senna  1 tablet Oral BID   sodium chloride  flush  3 mL Intravenous Q12H   sodium chloride  flush  3 mL Intravenous Q12H   spironolactone   12.5 mg Oral Daily   sucralfate   1 g Oral BID     Objective    Vitals:   02/21/24 1332 02/21/24 2047 02/22/24 0648 02/22/24 0734  BP: 111/61 (!) 144/74 (!) 135/55   Pulse: 66 (!) 58 65   Resp: 16 17 17    Temp: 97.7 F (36.5 C) 98.5 F (36.9 C) 98.4 F (36.9 C)   TempSrc: Oral     SpO2: 99% 100% 97% 94%  Weight:      Height:        General appearance: Awake alert.  In no distress Resp: Clear to auscultation bilaterally.  Normal effort Cardio: S1-S2 is normal regular.  No S3-S4.  No rubs murmurs or bruit GI: Abdomen is soft.  Nontender nondistended.  Bowel sounds are present normal.  No masses organomegaly    Data Reviewed:  Basic Metabolic Panel: Recent Labs  Lab 02/16/24 0448 02/17/24 0541 02/18/24 0437 02/19/24 0516 02/20/24 0450 02/21/24 0509  NA 139 140 140 139 140 142  K 3.1* 4.0 3.9 3.8 3.2* 3.5  CL 103 102 104 107 103 106  CO2 28 29 27 25 28 26   GLUCOSE 120* 107* 107* 78 83 86  BUN 28* 37* 40* 38* 34* 28*  CREATININE 0.98 1.27* 1.35* 1.43* 1.33* 1.21*  CALCIUM  8.3* 8.5* 8.4* 8.0* 8.0* 8.2*  MG 1.8 1.8  --   --  1.7  --   PHOS 2.7 2.1* 3.4  --   --   --     CBC: Recent Labs  Lab 02/17/24 0541 02/18/24 0437 02/19/24 0516 02/20/24 0450 02/21/24 0509  WBC 6.5 5.6 5.9 5.8 6.6  HGB  8.4* 8.4* 8.4* 8.8* 8.5*  HCT 30.0* 30.7* 30.0* 31.6* 32.0*  MCV 95.8 97.8 98.7 98.4 97.9  PLT 203 190 170 164 146*     [name]   [facility] If 7PM-7AM, please contact night-coverage at [email or web address], Office  [phone]   02/22/2024, 8:58 AM  LOS: 11 days

## 2024-02-22 NOTE — Progress Notes (Signed)
 Daily Progress Note   Patient Name: Peggy Armstrong       Date: 02/22/2024 DOB: 1945/09/26  Age: 78 y.o. MRN#: [id number] Attending Physician: Verdene Purchase, MD Primary Care Physician: Iven [name] [name] Admit Date: 02/10/2024 Length of Stay: 11 days  Reason for Consultation/Follow-up: Establishing goals of care  Subjective:    Reviewed EMR including recent hospitalist and [facility] documentation.  Patient agreeing with facility for rehab at this time. At time of EMR review on past 24 hours patient has received as needed p.o. Dilaudid  1 mg x 3 doses.  Patient continues to receive scheduled Tylenol  1000 mg every 8 hours during the day.  Presented to bedside to see patient.  Patient in brace about to walk with mobility therapist.  Patient motivated to participate in therapy.  Discussed pain management at this time.  Patient feels the oral Dilaudid  does help her pain though not enough.  Noted would slowly increase medication to 2 mg oral Dilaudid  every 4 hours as needed today.  Patient agreeing with this plan. Patient had also noted constipation though hospitalist has already further adjusted medications for management of this.  Palliative medicine team continuing to follow along with patient's medical journey.  Objective:   Vital Signs:  BP (!) 135/55 (BP Location: Right Arm)   Pulse 65   Temp 98.4 F (36.9 C)   Resp 17   Ht 5' (1.524 m)   Wt 87.7 kg   SpO2 97%   BMI 37.76 kg/m   Physical Exam: General: NAD, alert, pleasant, elderly Cardiovascular: RRR Respiratory: no increased work of breathing noted, not in respiratory distress Neuro: A&Ox4, following commands easily Psych: appropriately answers all questions  Assessment & Plan:   Assessment: Patient is a 78 year old female with a past medical history of inoperable duodenal adenocarcinoma, rheumatoid arthritis, CKD, and multiple spontaneous vertebral compression fractures who was admitted on 02/10/2024 for new back pain.   During admission patient found to have acute L1 fracture as well as profound anemia.  GI consulted and patient's status post EGD.  Palliative medicine team consulted to assist with complex medical decision making.  Recommendations/Plan: # Complex medical decision making/goals of care:  - Patient has already expressed wishes to receive full scope of appropriate medical care and to remain full code.  Patient continues to express desire to seek further cancer directed therapies if offered.  Encouraged further conversations with oncologist regarding planning for this.  Also noted patient would have to be strong enough to tolerate cancer directed therapies.  Patient agreeing to skilled nursing facility for rehab.  Palliative medicine team continuing to follow along to engage in conversations as able and appropriate.  - Patient's reported HCPOA is daughter, Peggy Armstrong.  ACP documentation not found in EMR.  -  Code Status: Full Code  # Symptom management: Patient is receiving these palliative interventions for symptom management with an intent to improve quality of life.   - Pain, in setting of inoperable duodenal adenocarcinoma and acute L1 fracture   - Agree with Tylenol  1000 mg every 8 hours during the day   - Increase oral Dilaudid  to 2 mg every 4 hours as needed.  Not using oral morphine  due to kidney dysfunction (and shortage of medications).  Patient notes unable to tolerate oxycodone  as causes hallucinations.   - Continue IV Dilaudid  to 0.5-1 mg every 2 hours as needed breakthrough pain uncontrolled by oral Dilaudid .   - Continue Robaxin  to 500 mg every 8 hours as needed.   -  Constipation   - Agree with senna 2 tab twice daily   - Agree with MiraLAX  17 g daily  # Discharge Planning: Skilled Nursing Facility for rehab with Palliative care service follow-up - TOC assisting with coordination of discharge planning.  Patient agreeing with referral to SNF for rehab.  Recommend palliative referral  in outpatient setting as well.  Thank you for allowing the palliative care team to participate in the care Peggy Armstrong.  Tinnie Radar, DO Palliative Care Provider PMT # [phone]  If patient remains symptomatic despite maximum doses, please call PMT at [phone] between 0700 and 1900. Outside of these hours, please call attending, as PMT does not have night coverage.  Personally spent 50 minutes in patient care including extensive chart review (labs, imaging, progress/consult notes, vital signs), medically appropraite exam, discussed with treatment team, education to patient, family, and staff, documenting clinical information, medication review and management, coordination of care, and available advanced directive documents.

## 2024-02-22 NOTE — Plan of Care (Signed)

## 2024-02-22 NOTE — TOC Progression Note (Signed)
 Transition of Care [facility]) - Progression Note    Patient Details  Name: Peggy Armstrong MRN: [id number] Date of Birth: 03/08/1946  [facility] CM/SW Contact  [name], RN Phone Number: 02/22/2024, 1:32 PM  Clinical Narrative:  Met with patient at bedside to review short term rehab/SNF bed offers ( [facility], [facility], [place], [address], [place], [address] and [place], [facility], [place], accepted bed offer at [facility] w/SNF notified, request check of SNF bed days, patient may be in copay days.      Expected Discharge Plan: Home w Home Health Services Barriers to Discharge: Continued Medical Work up               Expected Discharge Plan and Services       Living arrangements for the past 2 months: Apartment                                       Social Drivers of Health (SDOH) Interventions SDOH Screenings   Food Insecurity: No Food Insecurity (02/10/2024)  Housing: Low Risk  (02/10/2024)  Transportation Needs: No Transportation Needs (02/10/2024)  Utilities: Not At Risk (02/10/2024)  Depression (PHQ2-9): Low Risk  (12/22/2023)  Financial Resource Strain: Low Risk  (12/02/2022)   Received from [facility]  Physical Activity: Unknown (04/05/2022)   Received from [facility]  Social Connections: Moderately Isolated (02/10/2024)  Stress: No Stress Concern Present (04/05/2022)   Received from [facility]  Tobacco Use: Medium Risk (02/14/2024)    Readmission Risk Interventions    02/12/2024    2:43 PM 07/24/2023    5:23 PM 05/14/2023    6:44 PM  Readmission Risk Prevention Plan  Transportation Screening Complete Complete Complete  PCP or Specialist Appt within 3-5 Days Complete Complete Complete  HRI or Home Care Consult Complete Complete Complete  Social Work Consult for Recovery Care Planning/Counseling  Complete Complete  Palliative Care Screening Not Applicable Not Applicable Complete   Medication Review Oceanographer) Complete Referral to Pharmacy Referral to Pharmacy

## 2024-02-23 ENCOUNTER — Inpatient Hospital Stay: Admitting: Oncology

## 2024-02-23 ENCOUNTER — Institutional Professional Consult (permissible substitution): Admitting: Radiation Oncology

## 2024-02-23 ENCOUNTER — Other Ambulatory Visit

## 2024-02-23 DIAGNOSIS — D649 Anemia, unspecified: Secondary | ICD-10-CM | POA: Diagnosis not present

## 2024-02-23 DIAGNOSIS — S32010A Wedge compression fracture of first lumbar vertebra, initial encounter for closed fracture: Secondary | ICD-10-CM | POA: Diagnosis not present

## 2024-02-23 LAB — CBC
HCT: 30.4 % — ABNORMAL LOW (ref 36.0–46.0)
Hemoglobin: 8.4 g/dL — ABNORMAL LOW (ref 12.0–15.0)
MCH: 27.2 pg (ref 26.0–34.0)
MCHC: 27.6 g/dL — ABNORMAL LOW (ref 30.0–36.0)
MCV: 98.4 fL (ref 80.0–100.0)
Platelets: 121 K/uL — ABNORMAL LOW (ref 150–400)
RBC: 3.09 MIL/uL — ABNORMAL LOW (ref 3.87–5.11)
RDW: 17.2 % — ABNORMAL HIGH (ref 11.5–15.5)
WBC: 5.3 K/uL (ref 4.0–10.5)
nRBC: 0 % (ref 0.0–0.2)

## 2024-02-23 LAB — BASIC METABOLIC PANEL WITH GFR
Anion gap: 10 (ref 5–15)
BUN: 26 mg/dL — ABNORMAL HIGH (ref 8–23)
CO2: 24 mmol/L (ref 22–32)
Calcium: 8.4 mg/dL — ABNORMAL LOW (ref 8.9–10.3)
Chloride: 106 mmol/L (ref 98–111)
Creatinine, Ser: 0.98 mg/dL (ref 0.44–1.00)
GFR, Estimated: 59 mL/min — ABNORMAL LOW (ref >60)
Glucose, Bld: 77 mg/dL (ref 70–99)
Potassium: 4.3 mmol/L (ref 3.5–5.1)
Sodium: 140 mmol/L (ref 135–145)

## 2024-02-23 LAB — MAGNESIUM: Magnesium: 1.9 mg/dL (ref 1.7–2.4)

## 2024-02-23 MED ORDER — SPIRONOLACTONE 25 MG PO TABS: 12.5000 mg | ORAL_TABLET | Freq: Every day | ORAL | Status: DC

## 2024-02-23 MED ORDER — SENNOSIDES-DOCUSATE SODIUM 8.6-50 MG PO TABS: 2.0000 | ORAL_TABLET | Freq: Two times a day (BID) | ORAL | Status: DC

## 2024-02-23 MED ORDER — LIDOCAINE 5 % EX PTCH: 1.0000 | MEDICATED_PATCH | CUTANEOUS | Status: DC

## 2024-02-23 MED ORDER — POLYETHYLENE GLYCOL 3350 17 G PO PACK
17.0000 g | PACK | Freq: Every day | ORAL | Status: DC
Start: 2024-02-24 — End: 2024-04-22

## 2024-02-23 MED ORDER — BISACODYL 10 MG RE SUPP: 10.0000 mg | Freq: Every day | RECTAL | Status: DC | PRN

## 2024-02-23 MED ORDER — HYDROMORPHONE HCL 2 MG PO TABS
2.0000 mg | ORAL_TABLET | ORAL | Status: DC | PRN
  Administered 2024-02-23 – 2024-02-24 (×2): 2 mg via ORAL
  Filled 2024-02-23 (×2): qty 1

## 2024-02-23 MED ORDER — PANTOPRAZOLE SODIUM 40 MG PO TBEC: 40.0000 mg | DELAYED_RELEASE_TABLET | Freq: Two times a day (BID) | ORAL | Status: DC

## 2024-02-23 MED ORDER — HYDROMORPHONE HCL 2 MG PO TABS: 2.0000 mg | ORAL_TABLET | ORAL | 0 refills | Status: DC | PRN

## 2024-02-23 MED ORDER — SUCRALFATE 1 G PO TABS: 1.0000 g | ORAL_TABLET | Freq: Two times a day (BID) | ORAL | Status: DC

## 2024-02-23 MED ORDER — DILTIAZEM HCL ER COATED BEADS 300 MG PO CP24: 300.0000 mg | ORAL_CAPSULE | Freq: Every day | ORAL | Status: DC

## 2024-02-23 MED ORDER — ACETAMINOPHEN 500 MG PO TABS: 1000.0000 mg | ORAL_TABLET | Freq: Three times a day (TID) | ORAL | Status: AC

## 2024-02-23 MED ORDER — AMIODARONE HCL 200 MG PO TABS
200.0000 mg | ORAL_TABLET | Freq: Two times a day (BID) | ORAL | 0 refills | Status: AC
  Filled 2024-04-06 – 2024-05-27 (×2): qty 180, 90d supply, fill #0

## 2024-02-23 NOTE — TOC Progression Note (Addendum)
 Transition of Care [facility]) - Progression Note    Patient Details  Name: Peggy Armstrong MRN: [id number] Date of Birth: 1945-08-03  Transition of Care [facility]) CM/SW Contact  [name], RN Phone Number: 02/23/2024, 12:11 PM  Clinical Narrative:   Met with patient at bedside with Glenys, admit coordinator w/[facility] on speaker phone, Glenys confirmed patient is in her copay days with 68 SNF days remaining. Glenys confirmed that facility will bill copay days, no monies due upfront, patient agreeable.   -3:46pm SNF auth initiated via Lexine Barrows ID [id number], barrows pending.     Expected Discharge Plan: Home w Home Health Services Barriers to Discharge: Continued Medical Work up               Expected Discharge Plan and Services       Living arrangements for the past 2 months: Apartment                                       Social Drivers of Health (SDOH) Interventions SDOH Screenings   Food Insecurity: No Food Insecurity (02/10/2024)  Housing: Low Risk  (02/10/2024)  Transportation Needs: No Transportation Needs (02/10/2024)  Utilities: Not At Risk (02/10/2024)  Depression (PHQ2-9): Low Risk  (12/22/2023)  Financial Resource Strain: Low Risk  (12/02/2022)   Received from [facility]  Physical Activity: Unknown (04/05/2022)   Received from [facility]  Social Connections: Moderately Isolated (02/10/2024)  Stress: No Stress Concern Present (04/05/2022)   Received from [facility]  Tobacco Use: Medium Risk (02/14/2024)    Readmission Risk Interventions    02/12/2024    2:43 PM 07/24/2023    5:23 PM 05/14/2023    6:44 PM  Readmission Risk Prevention Plan  Transportation Screening Complete Complete Complete  PCP or Specialist Appt within 3-5 Days Complete Complete Complete  HRI or Home Care Consult Complete Complete Complete  Social Work Consult for Recovery Care Planning/Counseling  Complete Complete  Palliative Care Screening Not  Applicable Not Applicable Complete  Medication Review Oceanographer) Complete Referral to Pharmacy Referral to Pharmacy

## 2024-02-23 NOTE — Progress Notes (Signed)
 [facility]  PROGRESS NOTE  Peggy Armstrong FMW:[id number] DOB: 20-Sep-1945 DOA: 02/10/2024 PCP: Iven [name] DASEN, PA-C   Brief HPI:   78 year old woman complex PMH including inoperable duodenal adenocarcinoma, rheumatoid arthritis, CKD, multiple spontaneous vertebral compression fractures, who presented to the Emergency Department for new back pain. Evaluation was notable for profound anemia and CT showed acute L1 compression fracture. She was admitted for transfusion, pain control. GI was consulted. Hemoglobin stabilized, she eventually elected endoscopy which will be performed tomorrow. She refuses SNF and will discharge home with home health when ready.  Procedures/Events 8/16 admit for severe anemia, new L1 compression fx with acute pain 8/20 s/p EGD:  White nummular lesions in esophageal mucosa.                            Brushings performed to rule out Candida. - Multiple inflammatoryappearing gastric polyps.  - Malignant duodenal mass with mild slow oozing taking over the entirety of the duodenum 2. PuraStat injected. Slow to recover with hospital stay complicated by volume overload and subacute kidney injury.  Plan is for eventual transfer to CIR if insurance approves.   Subjective   Patient feels better.  Has not had a bowel movement in 2 days.  Agreeable to suppository if oral laxatives do not work.  Waiting to hear from SNF and insurance regarding rehab.       Assessment/Plan:   Acute on chronic anemia, macrocytic, asymptomatic -Severe anemia hemoglobin 5.5.  Transfused 2 units 8/16 with appropriate rise.   -GI consultation appreciated.   -8/20 s/p EGD: Oozing duodenal malignant mass, pleural State injected by GI.  Patient remains at high risk for rebleeding if DOAC restarted. Hemoglobin has been stable for several days.  Duodenal adenocarcinoma Follow-up with primary oncologist in [place] as an outpatient. Patient was seen by general surgery, recommended no  surgical intervention, patient is very poor candidate, recommended radiation therapy.  General surgery will discuss with oncologist Dr. Ezzard and will refer her to radiation oncology.  Patient agreed with this plan Palliative care consult for goals of care-full scope for now   Acute on chronic diastolic CHF exacerbation -Diuresed with Lasix . -TTE LVEF 65%, G1 DD, mild to moderate TR, moderate atrial dilatation.  No any other significant finding Stable.   Hypokalemia/hypophosphatemia Supplemented.  Continue daily potassium.    Iron  deficiency, Tsat 4%. IV Venofer  300 mg x 1 dose given Continue oral iron  supplement on discharge   Acute L1 compression fracture On and off pain.  Lidocaine  patch has been ordered.  Seen by palliative care and they have also made changes to her medication regimen.  She is on Tylenol  around-the-clock.  She is on hydromorphone  orally on a as needed basis.  She is also on Robaxin  on as-needed basis.   Patient was seen by PT who recommended CIR versus SNF.  Insurance denied inpatient rehabilitation.  Initially patient was reluctant to go to SNF but now seems to be agreeable.  TOC is following.     Chronic thrombocytopenia Chronic since at least January, stable   Acquired hypothyroidism Continue levothyroxine    Atypical atrial flutter -Currently on amiodarone , diltiazem  120 mg daily -Holding apixaban .  - Eliquis  has been discontinued.  Patient remains at high risk for rebleeding on DOAC.  Would recommend to follow-up with cardiology to restart DOAC if able, not a candidate for Watchman procedure Patient was seen by cardiology during this hospital stay but they have signed off  now.  Constipation Continue with bowel regimen.  May need suppository.  Abdomen is benign on examination.   CKD stage IIIa Renal function is stable.  Obesity Body mass index is 38.45 kg/m.    DVT prophylaxis: SCDs Code Status: Full code Family Communication: No family at  bedside Disposition: CIR denied by insurance.  Patient now agreeable to go to SNF.  TOC is following.   CONSULTS: Cardiology, GI   Medications:   acetaminophen   1,000 mg Oral Q8H   allopurinol   300 mg Oral Daily   amiodarone   200 mg Oral BID   arformoterol   15 mcg Nebulization BID   busPIRone   10 mg Oral BID   cyanocobalamin   1,000 mcg Oral Daily   diltiazem   300 mg Oral Daily   DULoxetine   20 mg Oral Daily   levothyroxine   100 mcg Oral QAC breakfast   lidocaine   1 patch Transdermal Q24H   melatonin  5 mg Oral QHS   pantoprazole   40 mg Oral BID   polyethylene glycol  17 g Oral Daily   potassium chloride  SA  20 mEq Oral QHS   senna-docusate  2 tablet Oral BID   sodium chloride  flush  3 mL Intravenous Q12H   sodium chloride  flush  3 mL Intravenous Q12H   spironolactone   12.5 mg Oral Daily   sucralfate   1 g Oral BID     Objective    Vitals:   02/22/24 2106 02/23/24 0657 02/23/24 0719 02/23/24 0856  BP: (!) 147/78 (!) 151/73    Pulse: 65 67    Resp: 17 17    Temp: 98.1 F (36.7 C) (!) 97.5 F (36.4 C)    TempSrc: Oral Oral    SpO2: 100% 97%  94%  Weight:   88.1 kg   Height:        General appearance: Awake alert.  In no distress Resp: Clear to auscultation bilaterally.  Normal effort Cardio: S1-S2 is normal regular.  No S3-S4.  No rubs murmurs or bruit GI: Abdomen is soft.  Nontender nondistended.  Bowel sounds are present normal.  No masses organomegaly   Data Reviewed:  Basic Metabolic Panel: Recent Labs  Lab 02/17/24 0541 02/18/24 0437 02/19/24 0516 02/20/24 0450 02/21/24 0509 02/23/24 0502  NA 140 140 139 140 142 140  K 4.0 3.9 3.8 3.2* 3.5 4.3  CL 102 104 107 103 106 106  CO2 29 27 25 28 26 24   GLUCOSE 107* 107* 78 83 86 77  BUN 37* 40* 38* 34* 28* 26*  CREATININE 1.27* 1.35* 1.43* 1.33* 1.21* 0.98  CALCIUM  8.5* 8.4* 8.0* 8.0* 8.2* 8.4*  MG 1.8  --   --  1.7  --  1.9  PHOS 2.1* 3.4  --   --   --   --     CBC: Recent Labs  Lab  02/18/24 0437 02/19/24 0516 02/20/24 0450 02/21/24 0509 02/23/24 0502  WBC 5.6 5.9 5.8 6.6 5.3  HGB 8.4* 8.4* 8.8* 8.5* 8.4*  HCT 30.7* 30.0* 31.6* 32.0* 30.4*  MCV 97.8 98.7 98.4 97.9 98.4  PLT 190 170 164 146* 121*     [name]   [facility] If 7PM-7AM, please contact night-coverage at [email or web address], Office  [phone]   02/23/2024, 9:08 AM  LOS: 12 days

## 2024-02-23 NOTE — Progress Notes (Signed)
 Physical Therapy Treatment Patient Details Name: Peggy Armstrong MRN: [id number] DOB: [birth year] Today's Date: 02/23/2024   History of Present Illness 78 y.o. female who presented to the emergency department 02/10/24 with profound fatigue and back pain. Pt found to have hemoglobin 5.5 and positive fecal occult blood. EGD on 8/20. CT= Acute L1 compression fracture.  2. Mild L3 compression fracture with progressive height loss since  10/20/2023.  3. 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 GI consulted. Pt with extensive medical history including but not limited to congestive heart failure, A-fib on Eliquis , AAA, HTN, chronic back pain, history of DVT, CKD and ampullary adenocarcinoma, L humerus fx, RA, L elbow avulsion fx, LLE fx    PT Comments  Pt with improvement over last session but remains unsteady, fall risk, and needs assist.  She ambulated 51' then 30' with RW min A and chair follow.  Pt fatigues very easily and has low foot clearance with ambulation.  Pt does have increased pain with activity - reports the back brace she has at home is much more comfortable and supportive - discussed could have family bring it for PT to assess.   Pt lives on 2nd floor apt - she is trying to move to first floor but no rooms available at this time.  Do continue to recommend Patient will benefit from continued inpatient follow up therapy, <3 hours/day at d/c (noted insurance denied AIR)   If plan is discharge home, recommend the following: Help with stairs or ramp for entrance;Assist for transportation;Assistance with cooking/housework;A lot of help with walking and/or transfers;A lot of help with bathing/dressing/bathroom   Can travel by private vehicle     No  Equipment Recommendations  None recommended by PT    Recommendations for Other Services       Precautions / Restrictions Precautions Precautions: Fall;Back Spinal Brace: Thoracolumbosacral  orthotic;Applied in sitting position     Mobility  Bed Mobility Overal bed mobility: Needs Assistance Bed Mobility: Rolling, Sidelying to Sit Rolling: Min assist Sidelying to sit: Mod assist       General bed mobility comments: Increased time; assist for trunk and scooting forward    Transfers Overall transfer level: Needs assistance Equipment used: Rolling walker (2 wheels) Transfers: Sit to/from Stand Sit to Stand: Min assist           General transfer comment: Performed x 3; increased time; cues for controlled descent    Ambulation/Gait Ambulation/Gait assistance: Min assist Gait Distance (Feet): 40 Feet (40' then 30') Assistive device: Rolling walker (2 wheels) Gait Pattern/deviations: Step-to pattern, Decreased stride length, Decreased dorsiflexion - right, Decreased dorsiflexion - left, Trunk flexed Gait velocity: decreased     General Gait Details: Pt started with unsteady shuffle pattern , progressed to step through pattern for about 30' but with fatigue back to shuffle; required chair follow and seated rest break and min A for stability and RW management   Stairs             Wheelchair Mobility     Tilt Bed    Modified Rankin (Stroke Patients Only)       Balance Overall balance assessment: Needs assistance, History of Falls Sitting-balance support: Feet supported, No upper extremity supported Sitting balance-Leahy Scale: Fair     Standing balance support: Bilateral upper extremity supported Standing balance-Leahy Scale: Poor Standing balance comment: RW and CGA/min A  Communication    Cognition Arousal: Alert Behavior During Therapy: WFL for tasks assessed/performed   PT - Cognitive impairments: No apparent impairments                       PT - Cognition Comments: occasional redirection to task        Cueing    Exercises General Exercises - Lower Extremity Ankle Circles/Pumps:  AROM, Both, 10 reps, Seated Long Arc Quad: AROM, Both, 10 reps, Seated    General Comments General comments (skin integrity, edema, etc.): VSS      Pertinent Vitals/Pain Pain Assessment Pain Assessment: Faces Faces Pain Scale: Hurts even more Pain Location: Back Pain Descriptors / Indicators: Grimacing, Guarding Pain Intervention(s): Limited activity within patient's tolerance, Monitored during session, Premedicated before session, Repositioned    Home Living                          Prior Function            PT Goals (current goals can now be found in the care plan section) Progress towards PT goals: Progressing toward goals    Frequency    Min 3X/week      PT Plan      Co-evaluation              AM-PAC PT 6 Clicks Mobility   Outcome Measure  Help needed turning from your back to your side while in a flat bed without using bedrails?: A Little Help needed moving from lying on your back to sitting on the side of a flat bed without using bedrails?: A Lot Help needed moving to and from a bed to a chair (including a wheelchair)?: A Little Help needed standing up from a chair using your arms (e.g., wheelchair or bedside chair)?: A Little Help needed to walk in hospital room?: A Little Help needed climbing 3-5 steps with a railing? : Total 6 Click Score: 15    End of Session Equipment Utilized During Treatment: Gait belt;Back brace Activity Tolerance: Patient tolerated treatment well Patient left: in chair;with call bell/phone within reach Nurse Communication: Mobility status PT Visit Diagnosis: Other abnormalities of gait and mobility (R26.89)     Time: 1443-1510 PT Time Calculation (min) (ACUTE ONLY): 27 min  Charges:    $Gait Training: 8-22 mins $Therapeutic Activity: 8-22 mins PT General Charges $$ ACUTE PT VISIT: 1 Visit                     Benjiman, PT Acute Rehab Services [facility] [phone]    Benjiman VEAR Mulberry 02/23/2024, 4:31 PM

## 2024-02-23 NOTE — Plan of Care (Signed)
 ?  Problem: Clinical Measurements: ?Goal: Will remain free from infection ?Outcome: Progressing ?  ?

## 2024-02-23 NOTE — Progress Notes (Signed)
 Daily Progress Note   Patient Name: Peggy Armstrong       Date: 02/23/2024 DOB: [birth year]  Age: 78 y.o. MRN#: [id number] Attending Physician: Verdene Purchase, MD Primary Care Physician: Iven [name] [name] Admit Date: 02/10/2024 Length of Stay: 12 days  Reason for Consultation/Follow-up: Establishing goals of care  Subjective:    Reviewed EMR including recent documentation from hospitalist and TOC.  Awaiting coordination of insurance approval regarding SNF for rehab. At time of EMR review in past 24 hours patient has received as needed p.o. Dilaudid  2 mg x 1 dose.  Presented to bedside to see patient.  Patient sitting up in chair at bedside.  Able to follow-up regarding symptom management.  Patient notes that she did have a bowel movement this morning so feeling better from that perspective.  Patient noted that she was having worsening pain from sitting up in chair.  Inquired about use of p.o. Dilaudid .  Patient feels the 2 mg dose does help somewhat though not enough when moving.  Patient denied any adverse effects of medication including lethargy.  Noted would add a range of oral Dilaudid  from 2-4 mg every 4 hours as needed.  Patient agreement with this plan.  Patient noting he is awaiting further information about going to rehab.  Answered questions as able at that time.  Noted palliative medicine team to continue to follow along with patient's medical journey.  Discussed care with RN to coordinate care.  Objective:   Vital Signs:  BP (!) 151/73 (BP Location: Right Arm)   Pulse 67   Temp (!) 97.5 F (36.4 C) (Oral)   Resp 17   Ht 5' (1.524 m)   Wt 88.1 kg   SpO2 97%   BMI 37.93 kg/m   Physical Exam: General: NAD, alert, pleasant, elderly Cardiovascular: RRR Respiratory: no increased work of breathing noted, not in respiratory distress Neuro: A&Ox4, following commands easily Psych: appropriately answers all questions  Assessment & Plan:   Assessment: Patient is a  78 year old female with a past medical history of inoperable duodenal adenocarcinoma, rheumatoid arthritis, CKD, and multiple spontaneous vertebral compression fractures who was admitted on 02/10/2024 for new back pain.  During admission patient found to have acute L1 fracture as well as profound anemia.  GI consulted and patient's status post EGD.  Palliative medicine team consulted to assist with complex medical decision making.  Recommendations/Plan: # Complex medical decision making/goals of care:  - Patient has already expressed wishes to receive full scope of appropriate medical care and to remain full code.  Patient continues to express desire to seek further cancer directed therapies if offered.  Encouraged further conversations with oncologist regarding planning for this.  Also noted patient would have to be strong enough to tolerate cancer directed therapies.  Patient agreeing to skilled nursing facility for rehab.  Palliative medicine team continuing to follow along to engage in conversations as able and appropriate.  - Patient's reported HCPOA is daughter, Peggy Armstrong.  ACP documentation not found in EMR.  -  Code Status: Full Code  # Symptom management: Patient is receiving these palliative interventions for symptom management with an intent to improve quality of life.   - Pain, in setting of inoperable duodenal adenocarcinoma and acute L1 fracture   - Continue with Tylenol  1000 mg every 8 hours during the day   - Increase oral Dilaudid  to 2-4 mg every 4 hours as needed.  Not using oral morphine  due to kidney dysfunction (and shortage of medications).  Patient notes unable to tolerate oxycodone  as causes hallucinations.   - Continue IV Dilaudid  to 0.5-1 mg every 2 hours as needed breakthrough pain uncontrolled by oral Dilaudid .   - Continue Robaxin  to 500 mg every 8 hours as needed.   - Constipation   - Agree with senna 2 tab twice daily   - Agree with MiraLAX  17 g daily  # Discharge  Planning: Skilled Nursing Facility for rehab with Palliative care service follow-up - TOC assisting with coordination of discharge planning.  Patient agreeing with SNF for rehab.  Recommend palliative referral in outpatient setting as well.  Thank you for allowing the palliative care team to participate in the care Peggy Armstrong.  Tinnie Radar, DO Palliative Care Provider PMT # [phone]  If patient remains symptomatic despite maximum doses, please call PMT at [phone] between 0700 and 1900. Outside of these hours, please call attending, as PMT does not have night coverage.

## 2024-02-24 DIAGNOSIS — D649 Anemia, unspecified: Secondary | ICD-10-CM | POA: Diagnosis not present

## 2024-02-24 MED ORDER — HYDROMORPHONE HCL 2 MG PO TABS
2.0000 mg | ORAL_TABLET | ORAL | Status: DC | PRN
  Administered 2024-02-24 – 2024-02-25 (×3): 2 mg via ORAL
  Filled 2024-02-24 (×5): qty 1

## 2024-02-24 NOTE — TOC Progression Note (Signed)
 Transition of Care [facility]) - Progression Note    Patient Details  Name: Peggy Armstrong MRN: [id number] Date of Birth: 01/23/1946  Transition of Care [facility]) CM/SW Contact  Sheri ONEIDA [place], [place] Phone Number: 02/24/2024, 11:28 AM  Clinical Narrative:    Ins auth approved. SNF bed at [facility] will be ready Sunday 8/31. Will plan to dc in morning.   Expected Discharge Plan: Home w Home Health Services Barriers to Discharge: Continued Medical Work up               Expected Discharge Plan and Services       Living arrangements for the past 2 months: Apartment                                       Social Drivers of Health (SDOH) Interventions SDOH Screenings   Food Insecurity: No Food Insecurity (02/10/2024)  Housing: Low Risk  (02/10/2024)  Transportation Needs: No Transportation Needs (02/10/2024)  Utilities: Not At Risk (02/10/2024)  Depression (PHQ2-9): Low Risk  (12/22/2023)  Financial Resource Strain: Low Risk  (12/02/2022)   Received from [facility]  Physical Activity: Unknown (04/05/2022)   Received from [facility]  Social Connections: Moderately Isolated (02/10/2024)  Stress: No Stress Concern Present (04/05/2022)   Received from [facility]  Tobacco Use: Medium Risk (02/14/2024)    Readmission Risk Interventions    02/24/2024   11:28 AM 02/12/2024    2:43 PM 07/24/2023    5:23 PM  Readmission Risk Prevention Plan  Transportation Screening Complete Complete Complete  PCP or Specialist Appt within 3-5 Days  Complete Complete  HRI or Home Care Consult  Complete Complete  Social Work Consult for Recovery Care Planning/Counseling   Complete  Palliative Care Screening  Not Applicable Not Applicable  Medication Review [employer]) Complete Complete Referral to Pharmacy  PCP or Specialist appointment within 3-5 days of discharge Complete    HRI or Home Care Consult Complete    SW Recovery Care/Counseling Consult Complete    Palliative Care  Screening Not Applicable    Skilled Nursing Facility Complete

## 2024-02-24 NOTE — Plan of Care (Signed)
   Problem: Education: Goal: Knowledge of General Education information will improve Description Including pain rating scale, medication(s)/side effects and non-pharmacologic comfort measures Outcome: Progressing

## 2024-02-24 NOTE — Progress Notes (Signed)
 Mobility Specialist - Progress Note   02/24/24 1514  Mobility  Activity Pivoted/transferred from bed to chair  Level of Assistance Moderate assist, patient does 50-74%  Assistive Device Front wheel walker  Range of Motion/Exercises Active  Activity Response Tolerated fair  Mobility Referral Yes  Mobility visit 1 Mobility  Mobility Specialist Start Time (ACUTE ONLY) 1500  Mobility Specialist Stop Time (ACUTE ONLY) 1514  Mobility Specialist Time Calculation (min) (ACUTE ONLY) 14 min   Pt was found in bed and agreeable to mobilize. C/o radiating back pain and nausea. Limited to mobilization due to pain. At EOS was left on recliner chair with all needs met. Call bell in reach and NT notified.  Erminio Leos,  Mobility Specialist Can be reached via Secure Chat

## 2024-02-24 NOTE — Discharge Summary (Signed)
 [facility]  Physician Discharge Summary   Patient ID: Peggy Armstrong MRN: [id number] DOB/AGE: 01/29/1946 78 y.o.  Admit date: 02/10/2024 Discharge date:   02/24/2024   PCP: Iven [name] DASEN, PA-C  DISCHARGE DIAGNOSES:    Severe anemia   Duodenal adenocarcinoma (HCC)   Atypical atrial flutter (HCC)   CKD (chronic kidney disease) stage 3, GFR 30-59 ml/min (HCC)   Chronic diastolic CHF (congestive heart failure) (HCC)   Acquired hypothyroidism   Closed compression fracture of body of L1 vertebra (HCC)   Chronic anticoagulation   Constipation   RECOMMENDATIONS FOR OUTPATIENT FOLLOW UP: Please check CBC and basic metabolic panel in 3 to 4 days Patient needs to follow-up with her oncologist Dr. Ezzard in [place] in 2 to 3 weeks.    Home Health: SNF Equipment/Devices: None  CODE STATUS: Full code  DISCHARGE CONDITION: fair  Diet recommendation: Heart healthy  INITIAL HISTORY: 78 year old woman complex PMH including inoperable duodenal adenocarcinoma, rheumatoid arthritis, CKD, multiple spontaneous vertebral compression fractures, who presented to the Emergency Department for new back pain. Evaluation was notable for profound anemia and CT showed acute L1 compression fracture. She was admitted for transfusion, pain control. GI was consulted. Hemoglobin stabilized, she eventually elected endoscopy which will be performed tomorrow. She refuses SNF and will discharge home with home health when ready.  Procedures/Events 8/16 admit for severe anemia, new L1 compression fx with acute pain 8/20 s/p EGD:  White nummular lesions in esophageal mucosa.                            Brushings performed to rule out Candida. - Multiple inflammatoryappearing gastric polyps.  - Malignant duodenal mass with mild slow oozing taking over the entirety of the duodenum 2. PuraStat injected. Slow to recover with hospital stay complicated by volume overload and subacute kidney injury.  Plan  is for eventual transfer to CIR if insurance approves.    HOSPITAL COURSE:   Acute on chronic anemia, macrocytic, asymptomatic -Severe anemia hemoglobin 5.5.  Transfused 2 units 8/16 with appropriate rise.   -GI consultation appreciated.   -8/20 s/p EGD: Oozing duodenal malignant mass, pleural State injected by GI.  Patient remains at high risk for rebleeding if DOAC restarted. Hemoglobin has been stable for several days.   Duodenal adenocarcinoma Follow-up with primary oncologist (Dr. Ezzard) in [place] as an outpatient. Patient was seen by general surgery, recommended no surgical intervention, patient is very poor candidate, recommended radiation therapy.  General surgery will discuss with oncologist Dr. Ezzard and will refer her to radiation oncology.  Patient agreed with this plan Palliative care consulted for goals of care-full scope for now   Acute on chronic diastolic CHF exacerbation -Diuresed with Lasix . -TTE LVEF 65%, G1 DD, mild to moderate TR, moderate atrial dilatation.  No any other significant finding   Hypokalemia/hypophosphatemia Supplemented.  Continue daily potassium.  Check labs periodically.   Iron  deficiency, Tsat 4%. IV Venofer  300 mg x 1 dose given Continue oral iron  supplement on discharge.   Acute L1 compression fracture On and off pain.  Lidocaine  patch has been ordered.  Seen by palliative care and they have also made changes to her medication regimen.  She is on Tylenol  around-the-clock.  She is on hydromorphone  orally on a as needed basis.  She is also on Robaxin  on as-needed basis.     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  - Eliquis  has been discontinued.  Patient remains at high risk for rebleeding on DOAC.  Would recommend to follow-up with cardiology to restart DOAC if able, not  a candidate for Watchman procedure Patient was seen by cardiology during this hospital stay but they have signed off now.   Constipation Continue with bowel regimen.  Had a good bowel movement in the last 24 hours.   CKD stage IIIa Renal function is stable.   Obesity Body mass index is 38.45 kg/m.     Patient is stable.  Okay for discharge to SNF when bed is available.   PERTINENT LABS:  The results of significant diagnostics from this hospitalization (including imaging, microbiology, ancillary and laboratory) are listed below for reference.     Labs:   Basic Metabolic Panel: Recent Labs  Lab 02/18/24 0437 02/19/24 0516 02/20/24 0450 02/21/24 0509 02/23/24 0502  NA 140 139 140 142 140  K 3.9 3.8 3.2* 3.5 4.3  CL 104 107 103 106 106  CO2 27 25 28 26 24   GLUCOSE 107* 78 83 86 77  BUN 40* 38* 34* 28* 26*  CREATININE 1.35* 1.43* 1.33* 1.21* 0.98  CALCIUM  8.4* 8.0* 8.0* 8.2* 8.4*  MG  --   --  1.7  --  1.9  PHOS 3.4  --   --   --   --     CBC: Recent Labs  Lab 02/18/24 0437 02/19/24 0516 02/20/24 0450 02/21/24 0509 02/23/24 0502  WBC 5.6 5.9 5.8 6.6 5.3  HGB 8.4* 8.4* 8.8* 8.5* 8.4*  HCT 30.7* 30.0* 31.6* 32.0* 30.4*  MCV 97.8 98.7 98.4 97.9 98.4  PLT 190 170 164 146* 121*    BNP: BNP (last 3 results) Recent Labs    02/15/24 0503  BNP 633.1*    IMAGING STUDIES DG CHEST PORT 1 VIEW Result Date: 02/18/2024 EXAM: 1 VIEW XRAY OF THE CHEST 02/18/2024 09:05:00 AM COMPARISON: 02/14/2024 CLINICAL HISTORY: 02706 CHF (congestive heart failure) (HCC) 02706. CHF FINDINGS: LUNGS AND PLEURA: No focal pulmonary opacity. No pulmonary edema. Blunting of lateral costophrenic angles suggesting small pleural effusions. No pneumothorax. HEART AND MEDIASTINUM: No acute abnormality of the cardiac and mediastinal silhouettes. Aortic atherosclerosis (ICD10-I70.0). BONES AND SOFT TISSUES: Fracture deformity of the proximal left humerus again noted. IMPRESSION: 1. Blunting of  lateral costophrenic angles suggesting small pleural effusions. 2. Aortic atherosclerosis. 3. Fracture deformity of the proximal left humerus. Electronically signed by: Katheleen Faes MD 02/18/2024 12:52 PM EDT RP Workstation: [id number]   ECHOCARDIOGRAM COMPLETE Result Date: 02/15/2024    ECHOCARDIOGRAM REPORT   Patient Name:   TRIANA COOVER Date of Exam: 02/15/2024 Medical Rec #:  [id number]         Height:       60.0 in Accession #:    [id number]        Weight:       196.9 lb Date of Birth:  Mar 15, 1946         BSA:          1.854 m Patient Age:    78 years          BP:           155/73 mmHg Patient Gender: F                 HR:           80 bpm. Exam Location:  Inpatient Procedure: 2D  Echo, Color Doppler and Cardiac Doppler (Both Spectral and Color            Flow Doppler were utilized during procedure). Indications:    CHF  History:        Patient has prior history of Echocardiogram examinations, most                 recent 06/20/2023. Arrythmias:Atrial Flutter.  Sonographer:    Benard Stallion Referring Phys: [id number] DILEEP KUMAR IMPRESSIONS  1. Left ventricular ejection fraction, by estimation, is 60 to 65%. The left ventricle has normal function. The left ventricle has no regional wall motion abnormalities. Left ventricular diastolic parameters are consistent with Grade I diastolic dysfunction (impaired relaxation).  2. Right ventricular systolic function is normal. The right ventricular size is normal. There is mildly elevated pulmonary artery systolic pressure. The estimated right ventricular systolic pressure is 40.5 mmHg.  3. Left atrial size was moderately dilated.  4. Right atrial size was moderately dilated.  5. The mitral valve is normal in structure. No evidence of mitral valve regurgitation. No evidence of mitral stenosis.  6. Tricuspid valve regurgitation is mild to moderate.  7. The aortic valve is tricuspid. There is mild calcification of the aortic valve. Aortic valve regurgitation is not  visualized. Aortic valve sclerosis/calcification is present, without any evidence of aortic stenosis.  8. Aortic dilatation noted. There is borderline dilatation of the ascending aorta, measuring 38 mm.  9. The inferior vena cava is normal in size with greater than 50% respiratory variability, suggesting right atrial pressure of 3 mmHg. FINDINGS  Left Ventricle: Left ventricular ejection fraction, by estimation, is 60 to 65%. The left ventricle has normal function. The left ventricle has no regional wall motion abnormalities. The left ventricular internal cavity size was normal in size. There is  no left ventricular hypertrophy. Left ventricular diastolic parameters are consistent with Grade I diastolic dysfunction (impaired relaxation). Right Ventricle: The right ventricular size is normal. No increase in right ventricular wall thickness. Right ventricular systolic function is normal. There is mildly elevated pulmonary artery systolic pressure. The tricuspid regurgitant velocity is 2.85  m/s, and with an assumed right atrial pressure of 8 mmHg, the estimated right ventricular systolic pressure is 40.5 mmHg. Left Atrium: Left atrial size was moderately dilated. Right Atrium: Right atrial size was moderately dilated. Pericardium: There is no evidence of pericardial effusion. Mitral Valve: The mitral valve is normal in structure. No evidence of mitral valve regurgitation. No evidence of mitral valve stenosis. Tricuspid Valve: The tricuspid valve is normal in structure. Tricuspid valve regurgitation is mild to moderate. No evidence of tricuspid stenosis. Aortic Valve: The aortic valve is tricuspid. There is mild calcification of the aortic valve. Aortic valve regurgitation is not visualized. Aortic valve sclerosis/calcification is present, without any evidence of aortic stenosis. Aortic valve mean gradient measures 9.7 mmHg. Aortic valve peak gradient measures 17.2 mmHg. Aortic valve area, by VTI measures 2.15 cm.  Pulmonic Valve: The pulmonic valve was normal in structure. Pulmonic valve regurgitation is trivial. No evidence of pulmonic stenosis. Aorta: Aortic dilatation noted. There is borderline dilatation of the ascending aorta, measuring 38 mm. Venous: The inferior vena cava is normal in size with greater than 50% respiratory variability, suggesting right atrial pressure of 3 mmHg. IAS/Shunts: No atrial level shunt detected by color flow Doppler.  LEFT VENTRICLE PLAX 2D LVIDd:         4.90 cm   Diastology LVIDs:  3.20 cm   LV e' medial:    6.20 cm/s LV PW:         1.05 cm   LV E/e' medial:  15.3 LV IVS:        1.10 cm   LV e' lateral:   8.05 cm/s LVOT diam:     2.20 cm   LV E/e' lateral: 11.8 LV SV:         82 LV SV Index:   44 LVOT Area:     3.80 cm  RIGHT VENTRICLE RV Basal diam:  5.40 cm RV Mid diam:    4.30 cm RV S prime:     11.30 cm/s TAPSE (M-mode): 2.0 cm LEFT ATRIUM              Index        RIGHT ATRIUM           Index LA diam:        4.10 cm  2.21 cm/m   RA Area:     27.70 cm LA Vol (A2C):   125.0 ml 67.42 ml/m  RA Volume:   106.00 ml 57.17 ml/m LA Vol (A4C):   97.0 ml  52.32 ml/m LA Biplane Vol: 111.0 ml 59.87 ml/m  AORTIC VALVE AV Area (Vmax):    2.07 cm AV Area (Vmean):   2.05 cm AV Area (VTI):     2.15 cm AV Vmax:           207.33 cm/s AV Vmean:          140.333 cm/s AV VTI:            0.383 m AV Peak Grad:      17.2 mmHg AV Mean Grad:      9.7 mmHg LVOT Vmax:         113.00 cm/s LVOT Vmean:        75.600 cm/s LVOT VTI:          0.217 m LVOT/AV VTI ratio: 0.57  AORTA Ao Root diam: 2.60 cm Ao Asc diam:  3.80 cm MITRAL VALVE                TRICUSPID VALVE MV Area (PHT): 3.50 cm     TR Peak grad:   32.5 mmHg MV Decel Time: 217 msec     TR Vmax:        285.00 cm/s MV E velocity: 94.70 cm/s MV A velocity: 117.00 cm/s  SHUNTS MV E/A ratio:  0.81         Systemic VTI:  0.22 m                             Systemic Diam: 2.20 cm Toribio Fuel MD Electronically signed by Toribio Fuel MD  Signature Date/Time: 02/15/2024/6:20:29 PM    Final    DG CHEST PORT 1 VIEW Result Date: 02/14/2024 CLINICAL DATA:  Shortness of breath EXAM: PORTABLE CHEST 1 VIEW COMPARISON:  CT 10/20/2023 FINDINGS: Cardiomegaly with central vascular congestion. Small right-sided pleural effusion. Hazy edema or atelectasis at the bases. Aortic atherosclerosis. No pneumothorax. Old appearing bilateral rib fractures IMPRESSION: Cardiomegaly with central vascular congestion and small right pleural effusion. Hazy edema or atelectasis at the bases. Electronically Signed   By: [name] Bun M.D.   On: 02/14/2024 17:12   CT Lumbar Spine Wo Contrast Result Date: 02/10/2024 CLINICAL DATA:  Low back pain.  History of duodenal adenocarcinoma. EXAM: CT LUMBAR  SPINE WITHOUT CONTRAST TECHNIQUE: Multidetector CT imaging of the lumbar spine was performed without intravenous contrast administration. Multiplanar CT image reconstructions were also generated. RADIATION DOSE REDUCTION: This exam was performed according to the departmental dose-optimization program which includes automated exposure control, adjustment of the mA and/or kV according to patient size and/or use of iterative reconstruction technique. COMPARISON:  Lumbar spine CT 05/11/2023 and MRI 05/13/2023. Outside CT abdomen and pelvis 10/20/2023 from [facility]. FINDINGS: Segmentation: 5 lumbar type vertebrae. Alignment: Mild lumbar dextroscoliosis. Chronic grade 1 anterolisthesis of L4 on L5. Vertebrae: Chronic L2 burst fracture with severe central vertebral body height loss and 6 mm retropulsion, similar to the 10/20/2023 CT. Mild L3 compression fracture with 25% vertebral body height loss, new from 05/11/2023 and with mildly progressive height loss since 10/20/2023. New, likely acute L1 compression fracture with 25% height loss and no retropulsion. Hemangiomas in the T12 and L3 vertebral bodies. Old sacral insufficiency fractures. Old posterior right twelfth rib fracture.  Paraspinal and other soft tissues: Aortic atherosclerosis. Subcutaneous edema throughout the back. Partially visualized small bowel lipoma. Partially visualized embolization coils in the region of the duodenum and pancreatic head. Disc levels: L2 retropulsion is again noted to result in moderate spinal stenosis. Mild spinal stenosis at L4-5. IMPRESSION: 1. Acute L1 compression fracture. 2. Mild L3 compression fracture with progressive height loss since 10/20/2023. 3. 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 4.  Aortic Atherosclerosis (ICD10-I70.0). Electronically Signed   By: Dasie Hamburg M.D.   On: 02/10/2024 13:03    DISCHARGE EXAMINATION: Vitals:   02/23/24 2231 02/24/24 0458 02/24/24 0716 02/24/24 0755  BP: (!) 147/75  127/61   Pulse: 67  (!) 57   Resp: 18  18   Temp: 98.3 F (36.8 C)  98 F (36.7 C)   TempSrc: Oral  Oral   SpO2: 100%  99% 97%  Weight:  88 kg    Height:       General appearance: Awake alert.  In no distress Resp: Clear to auscultation bilaterally.  Normal effort Cardio: S1-S2 is normal regular.  No S3-S4.  No rubs murmurs or bruit GI: Abdomen is soft.  Nontender nondistended.  Bowel sounds are present normal.  No masses organomegaly Able to move her lower extremities although physical deconditioning is noted.  DISPOSITION: SNF  Discharge Instructions     Amb Referral to Intravenous Iron  Therapy   Complete by: As directed    You have been referred to [facility] [place] Infusion team for IV Iron  Infusions. The infusion pharmacy team will reach out to you with appointment information.    Primary Diagnosis Code for IV Iron : D50.0 - Iron  deficiency Anemia secondary to blood loss (Chronic)   Secondary diagnosis code for IV iron : Other   Comment: Cancer, GIB   Call MD for:  difficulty breathing, headache or visual disturbances   Complete by: As directed    Call MD for:  extreme fatigue   Complete by: As directed    Call MD for:   persistant dizziness or light-headedness   Complete by: As directed    Call MD for:  persistant nausea and vomiting   Complete by: As directed    Call MD for:  severe uncontrolled pain   Complete by: As directed    Call MD for:  temperature >100.4   Complete by: As directed    Diet - low sodium heart healthy   Complete by: As directed    Discharge instructions  Complete by: As directed    Please review instructions on the discharge summary.  You were cared for by a hospitalist during your hospital stay. If you have any questions about your discharge medications or the care you received while you were in the hospital after you are discharged, you can call the unit and asked to speak with the hospitalist on call if the hospitalist that took care of you is not available. Once you are discharged, your primary care physician will handle any further medical issues. Please note that NO REFILLS for any discharge medications will be authorized once you are discharged, as it is imperative that you return to your primary care physician (or establish a relationship with a primary care physician if you do not have one) for your aftercare needs so that they can reassess your need for medications and monitor your lab values. If you do not have a primary care physician, you can call [phone] for a physician referral.   Increase activity slowly   Complete by: As directed          Allergies as of 02/24/2024       Reactions   Codeine Nausea And Vomiting, Other (See Comments)   Made me very sick   Tape Other (See Comments)   Tears skin, as it is VERY THIN!!  - surgical tape   Oxycodone  Other (See Comments)   Hallucinations         Medication List     STOP taking these medications    diclofenac 50 MG EC tablet Commonly known as: VOLTAREN   Eliquis  2.5 MG Tabs tablet Generic drug: apixaban    Eliquis  5 MG Tabs tablet Generic drug: apixaban    torsemide  20 MG tablet Commonly known as:  DEMADEX    traMADol  50 MG tablet Commonly known as: ULTRAM        TAKE these medications    acetaminophen  500 MG tablet Commonly known as: TYLENOL  Take 2 tablets (1,000 mg total) by mouth every 8 (eight) hours. What changed:  medication strength how much to take when to take this reasons to take this   albuterol  108 (90 Base) MCG/ACT inhaler Commonly known as: VENTOLIN  HFA Inhale 1-2 puffs into the lungs every 6 (six) hours as needed for wheezing or shortness of breath.   allopurinol  300 MG tablet Commonly known as: ZYLOPRIM  Take 1 tablet (300 mg total) by mouth daily.   amiodarone  200 MG tablet Commonly known as: PACERONE  Take 1 tablet (200 mg total) by mouth 2 (two) times daily. What changed: when to take this   atorvastatin  20 MG tablet Commonly known as: LIPITOR Take 1 tablet (20 mg total) by mouth daily.   bisacodyl  10 MG suppository Commonly known as: DULCOLAX Place 1 suppository (10 mg total) rectally daily as needed for moderate constipation.   busPIRone  10 MG tablet Commonly known as: BUSPAR  TAKE 1 TABLET BY MOUTH TWICE A DAY   CALCIUM  600 + D PO Take 1 tablet by mouth in the morning.   cyanocobalamin  1000 MCG tablet Commonly known as: VITAMIN B12 Take 1,000 mcg by mouth daily. What changed: how much to take   diltiazem  300 MG 24 hr capsule Commonly known as: CARDIZEM  CD Take 1 capsule (300 mg total) by mouth daily. What changed:  medication strength how much to take   DULoxetine  20 MG capsule Commonly known as: CYMBALTA  TAKE 1 CAPSULE BY MOUTH EVERY DAY   ferrous sulfate  325 (65 FE) MG tablet Take 325 mg by mouth daily  with breakfast.   folic acid  1 MG tablet Commonly known as: FOLVITE  Take 1 tablet (1 mg total) by mouth daily.   HYDROmorphone  2 MG tablet Commonly known as: DILAUDID  Take 1 tablet (2 mg total) by mouth every 4 (four) hours as needed for severe pain (pain score 7-10).   ipratropium-albuterol  0.5-2.5 (3) MG/3ML  Soln Commonly known as: DUONEB Take 3 mLs by nebulization every 6 (six) hours as needed (for shortness of breath or wheezing).   Klor-Con  M20 20 MEQ tablet Generic drug: potassium chloride  SA Take 20 mEq by mouth in the morning and at bedtime.   levothyroxine  100 MCG tablet Commonly known as: SYNTHROID  Take 1 tablet (100 mcg total) by mouth daily before breakfast.   lidocaine  5 % Commonly known as: LIDODERM  Place 1 patch onto the skin daily. Remove & Discard patch within 12 hours or as directed by MD   melatonin 3 MG Tabs tablet Take 1 tablet (3 mg total) by mouth at bedtime as needed (insomnia). What changed: when to take this   methocarbamol  500 MG tablet Commonly known as: ROBAXIN  TAKE 1 TABLET BY MOUTH EVERY 6 HOURS AS NEEDED FOR MUSCLE SPASMS.   ondansetron  4 MG tablet Commonly known as: ZOFRAN  Take 4 mg by mouth every 6 (six) hours as needed for nausea or vomiting.   pantoprazole  40 MG tablet Commonly known as: PROTONIX  Take 1 tablet (40 mg total) by mouth 2 (two) times daily. What changed: when to take this   polyethylene glycol 17 g packet Commonly known as: MIRALAX  / GLYCOLAX  Take 17 g by mouth daily.   senna-docusate 8.6-50 MG tablet Commonly known as: Senokot-S Take 2 tablets by mouth 2 (two) times daily.   sertraline  25 MG tablet Commonly known as: ZOLOFT  Take 25 mg by mouth daily.   spironolactone  25 MG tablet Commonly known as: ALDACTONE  Take 0.5 tablets (12.5 mg total) by mouth daily.   sucralfate  1 g tablet Commonly known as: CARAFATE  Take 1 tablet (1 g total) by mouth 2 (two) times daily.   VITAMIN C PO Take 1 tablet by mouth in the morning.   VITAMIN D3 PO Take 1 capsule by mouth in the morning.          Contact information for follow-up providers     [facility] Follow up.   Specialty: Home Health Services Why: Home Health Physical Therapy/Occupational Therapy/Home Health Aide/Skilled Nursing (RN)/ Social  Worker Contact information: [address] [place] [address] [place] [address] [phone]              Contact information for after-discharge care     Destination     [facility] [place], [place] .   Service: Skilled Nursing Contact information: [address] [place]  [address] [phone]                     TOTAL DISCHARGE TIME: 35 mins  Aala Ransom  [facility] Pager on [email or web address]  02/24/2024, 9:45 AM

## 2024-02-25 DIAGNOSIS — G2581 Restless legs syndrome: Secondary | ICD-10-CM | POA: Diagnosis not present

## 2024-02-25 DIAGNOSIS — R6889 Other general symptoms and signs: Secondary | ICD-10-CM | POA: Diagnosis not present

## 2024-02-25 DIAGNOSIS — M4316 Spondylolisthesis, lumbar region: Secondary | ICD-10-CM | POA: Diagnosis not present

## 2024-02-25 DIAGNOSIS — Z86718 Personal history of other venous thrombosis and embolism: Secondary | ICD-10-CM | POA: Diagnosis not present

## 2024-02-25 DIAGNOSIS — I10 Essential (primary) hypertension: Secondary | ICD-10-CM | POA: Diagnosis not present

## 2024-02-25 DIAGNOSIS — R1312 Dysphagia, oropharyngeal phase: Secondary | ICD-10-CM | POA: Diagnosis not present

## 2024-02-25 DIAGNOSIS — E441 Mild protein-calorie malnutrition: Secondary | ICD-10-CM | POA: Diagnosis not present

## 2024-02-25 DIAGNOSIS — I509 Heart failure, unspecified: Secondary | ICD-10-CM | POA: Diagnosis not present

## 2024-02-25 DIAGNOSIS — Z79899 Other long term (current) drug therapy: Secondary | ICD-10-CM | POA: Diagnosis not present

## 2024-02-25 DIAGNOSIS — R918 Other nonspecific abnormal finding of lung field: Secondary | ICD-10-CM | POA: Diagnosis not present

## 2024-02-25 DIAGNOSIS — R269 Unspecified abnormalities of gait and mobility: Secondary | ICD-10-CM | POA: Insufficient documentation

## 2024-02-25 DIAGNOSIS — M109 Gout, unspecified: Secondary | ICD-10-CM | POA: Diagnosis not present

## 2024-02-25 DIAGNOSIS — S32010D Wedge compression fracture of first lumbar vertebra, subsequent encounter for fracture with routine healing: Secondary | ICD-10-CM | POA: Diagnosis not present

## 2024-02-25 DIAGNOSIS — M545 Low back pain, unspecified: Secondary | ICD-10-CM | POA: Diagnosis present

## 2024-02-25 DIAGNOSIS — R531 Weakness: Secondary | ICD-10-CM | POA: Diagnosis not present

## 2024-02-25 DIAGNOSIS — S32021A Stable burst fracture of second lumbar vertebra, initial encounter for closed fracture: Secondary | ICD-10-CM | POA: Diagnosis not present

## 2024-02-25 DIAGNOSIS — Z743 Need for continuous supervision: Secondary | ICD-10-CM | POA: Diagnosis not present

## 2024-02-25 DIAGNOSIS — I484 Atypical atrial flutter: Secondary | ICD-10-CM | POA: Diagnosis not present

## 2024-02-25 DIAGNOSIS — M069 Rheumatoid arthritis, unspecified: Secondary | ICD-10-CM | POA: Diagnosis not present

## 2024-02-25 DIAGNOSIS — I70209 Unspecified atherosclerosis of native arteries of extremities, unspecified extremity: Secondary | ICD-10-CM | POA: Diagnosis not present

## 2024-02-25 DIAGNOSIS — D509 Iron deficiency anemia, unspecified: Secondary | ICD-10-CM | POA: Diagnosis not present

## 2024-02-25 DIAGNOSIS — I5032 Chronic diastolic (congestive) heart failure: Secondary | ICD-10-CM | POA: Diagnosis not present

## 2024-02-25 DIAGNOSIS — R162 Hepatomegaly with splenomegaly, not elsewhere classified: Secondary | ICD-10-CM | POA: Diagnosis not present

## 2024-02-25 DIAGNOSIS — I879 Disorder of vein, unspecified: Secondary | ICD-10-CM | POA: Insufficient documentation

## 2024-02-25 DIAGNOSIS — E785 Hyperlipidemia, unspecified: Secondary | ICD-10-CM | POA: Diagnosis not present

## 2024-02-25 DIAGNOSIS — Z85068 Personal history of other malignant neoplasm of small intestine: Secondary | ICD-10-CM | POA: Diagnosis not present

## 2024-02-25 DIAGNOSIS — N189 Chronic kidney disease, unspecified: Secondary | ICD-10-CM | POA: Diagnosis not present

## 2024-02-25 DIAGNOSIS — M6281 Muscle weakness (generalized): Secondary | ICD-10-CM | POA: Diagnosis not present

## 2024-02-25 DIAGNOSIS — R41841 Cognitive communication deficit: Secondary | ICD-10-CM | POA: Insufficient documentation

## 2024-02-25 DIAGNOSIS — E039 Hypothyroidism, unspecified: Secondary | ICD-10-CM | POA: Diagnosis not present

## 2024-02-25 DIAGNOSIS — M4726 Other spondylosis with radiculopathy, lumbar region: Secondary | ICD-10-CM | POA: Diagnosis not present

## 2024-02-25 DIAGNOSIS — S32512A Fracture of superior rim of left pubis, initial encounter for closed fracture: Secondary | ICD-10-CM | POA: Diagnosis not present

## 2024-02-25 DIAGNOSIS — D649 Anemia, unspecified: Secondary | ICD-10-CM | POA: Diagnosis not present

## 2024-02-25 DIAGNOSIS — I130 Hypertensive heart and chronic kidney disease with heart failure and stage 1 through stage 4 chronic kidney disease, or unspecified chronic kidney disease: Secondary | ICD-10-CM | POA: Diagnosis not present

## 2024-02-25 DIAGNOSIS — I700 Atherosclerosis of aorta: Secondary | ICD-10-CM | POA: Diagnosis not present

## 2024-02-25 DIAGNOSIS — Z7401 Bed confinement status: Secondary | ICD-10-CM | POA: Diagnosis not present

## 2024-02-25 DIAGNOSIS — M549 Dorsalgia, unspecified: Secondary | ICD-10-CM | POA: Diagnosis not present

## 2024-02-25 DIAGNOSIS — R0602 Shortness of breath: Secondary | ICD-10-CM | POA: Diagnosis not present

## 2024-02-25 DIAGNOSIS — R0989 Other specified symptoms and signs involving the circulatory and respiratory systems: Secondary | ICD-10-CM | POA: Diagnosis not present

## 2024-02-25 DIAGNOSIS — S32021D Stable burst fracture of second lumbar vertebra, subsequent encounter for fracture with routine healing: Secondary | ICD-10-CM | POA: Diagnosis not present

## 2024-02-25 DIAGNOSIS — N183 Chronic kidney disease, stage 3 unspecified: Secondary | ICD-10-CM | POA: Diagnosis not present

## 2024-02-25 DIAGNOSIS — C170 Malignant neoplasm of duodenum: Secondary | ICD-10-CM | POA: Diagnosis not present

## 2024-02-25 DIAGNOSIS — I4892 Unspecified atrial flutter: Secondary | ICD-10-CM | POA: Diagnosis not present

## 2024-02-25 DIAGNOSIS — F0390 Unspecified dementia without behavioral disturbance: Secondary | ICD-10-CM | POA: Diagnosis not present

## 2024-02-25 DIAGNOSIS — S32010A Wedge compression fracture of first lumbar vertebra, initial encounter for closed fracture: Secondary | ICD-10-CM | POA: Diagnosis not present

## 2024-02-25 DIAGNOSIS — S42202A Unspecified fracture of upper end of left humerus, initial encounter for closed fracture: Secondary | ICD-10-CM | POA: Diagnosis not present

## 2024-02-25 DIAGNOSIS — M48061 Spinal stenosis, lumbar region without neurogenic claudication: Secondary | ICD-10-CM | POA: Diagnosis not present

## 2024-02-25 DIAGNOSIS — R609 Edema, unspecified: Secondary | ICD-10-CM | POA: Diagnosis not present

## 2024-02-25 DIAGNOSIS — R2689 Other abnormalities of gait and mobility: Secondary | ICD-10-CM | POA: Diagnosis not present

## 2024-02-25 NOTE — Plan of Care (Signed)
   Problem: Education: Goal: Knowledge of General Education information will improve Description Including pain rating scale, medication(s)/side effects and non-pharmacologic comfort measures Outcome: Progressing

## 2024-02-25 NOTE — TOC Transition Note (Signed)
 Transition of Care [facility]) - Discharge Note   Patient Details  Name: Peggy Armstrong MRN: [id number] Date of Birth: 12/15/1945  Transition of Care [facility]) CM/SW Contact:  Sheri ONEIDA Sharps, LCSW Phone Number: 02/25/2024, 11:56 AM   Clinical Narrative:    Pt medically ready to dc to [facility]. Call report [phone] room 122a given to nurse. Dc packet w/ signed scripts left at nurses station. [facility] called. No further TOC needs.   Final next level of care: Skilled Nursing Facility Barriers to Discharge: Barriers Resolved   Patient Goals and CMS Choice Patient states their goals for this hospitalization and ongoing recovery are:: return homr following SNF CMS Medicare.gov Compare Post Acute Care list provided to:: Patient Choice offered to / list presented to : Patient [place] ownership interest in [facility].provided to:: Patient    Discharge Placement PASRR number recieved: 02/21/24            Patient chooses bed at: [facility] Patient to be transferred to facility by: [facility] Name of family member notified: self Patient and family notified of of transfer: 02/25/24  Discharge Plan and Services Additional resources added to the After Visit Summary for                  DME Arranged: N/A DME Agency: NA       HH Arranged: NA HH Agency: NA        Social Drivers of Health (SDOH) Interventions SDOH Screenings   Food Insecurity: No Food Insecurity (02/10/2024)  Housing: Low Risk  (02/10/2024)  Transportation Needs: No Transportation Needs (02/10/2024)  Utilities: Not At Risk (02/10/2024)  Depression (PHQ2-9): Low Risk  (12/22/2023)  Financial Resource Strain: Low Risk  (12/02/2022)   Received from [facility]  Physical Activity: Unknown (04/05/2022)   Received from [facility]  Social Connections: Moderately Isolated (02/10/2024)  Stress: No Stress Concern Present (04/05/2022)   Received from [facility]  Tobacco Use: Medium Risk  (02/14/2024)     Readmission Risk Interventions    02/24/2024   11:28 AM 02/12/2024    2:43 PM 07/24/2023    5:23 PM  Readmission Risk Prevention Plan  Transportation Screening Complete Complete Complete  PCP or Specialist Appt within 3-5 Days  Complete Complete  HRI or Home Care Consult  Complete Complete  Social Work Consult for Recovery Care Planning/Counseling   Complete  Palliative Care Screening  Not Applicable Not Applicable  Medication Review Oceanographer) Complete Complete Referral to Pharmacy  PCP or Specialist appointment within 3-5 days of discharge Complete    HRI or Home Care Consult Complete    SW Recovery Care/Counseling Consult Complete    Palliative Care Screening Not Applicable    Skilled Nursing Facility Complete

## 2024-02-25 NOTE — Discharge Summary (Signed)
 [facility]  Physician Discharge Summary   Patient ID: Peggy Armstrong MRN: [id number] DOB/AGE: 78/21/1947 78 y.o.  Admit date: 02/10/2024 Discharge date:   02/25/2024   PCP: Iven [name] DASEN, PA-C  DISCHARGE DIAGNOSES:    Severe anemia   Duodenal adenocarcinoma (HCC)   Atypical atrial flutter (HCC)   CKD (chronic kidney disease) stage 3, GFR 30-59 ml/min (HCC)   Chronic diastolic CHF (congestive heart failure) (HCC)   Acquired hypothyroidism   Closed compression fracture of body of L1 vertebra (HCC)   Chronic anticoagulation   Constipation   RECOMMENDATIONS FOR OUTPATIENT FOLLOW UP: Please check CBC and basic metabolic panel in 3 to 4 days Patient needs to follow-up with her oncologist Dr. Ezzard in [place] in 2 to 3 weeks.    Home Health: SNF Equipment/Devices: None  CODE STATUS: Full code  DISCHARGE CONDITION: fair  Diet recommendation: Heart healthy  INITIAL HISTORY: 78 year old woman complex PMH including inoperable duodenal adenocarcinoma, rheumatoid arthritis, CKD, multiple spontaneous vertebral compression fractures, who presented to the Emergency Department for new back pain. Evaluation was notable for profound anemia and CT showed acute L1 compression fracture. She was admitted for transfusion, pain control. GI was consulted. Hemoglobin stabilized, she eventually elected endoscopy which will be performed tomorrow. She refuses SNF and will discharge home with home health when ready.  Procedures/Events 8/16 admit for severe anemia, new L1 compression fx with acute pain 8/20 s/p EGD:  White nummular lesions in esophageal mucosa.                            Brushings performed to rule out Candida. - Multiple inflammatoryappearing gastric polyps.  - Malignant duodenal mass with mild slow oozing taking over the entirety of the duodenum 2. PuraStat injected. Slow to recover with hospital stay complicated by volume overload and subacute kidney injury.  Plan  is for eventual transfer to CIR if insurance approves.    HOSPITAL COURSE:   Acute on chronic anemia, macrocytic, asymptomatic -Severe anemia hemoglobin 5.5.  Transfused 2 units 8/16 with appropriate rise.   -GI consultation appreciated.   -8/20 s/p EGD: Oozing duodenal malignant mass, pleural State injected by GI.  Patient remains at high risk for rebleeding if DOAC restarted. Hemoglobin has been stable for several days.   Duodenal adenocarcinoma Follow-up with primary oncologist (Dr. Ezzard) in [place] as an outpatient. Patient was seen by general surgery, recommended no surgical intervention, patient is very poor candidate, recommended radiation therapy.  General surgery will discuss with oncologist Dr. Ezzard and will refer her to radiation oncology.  Patient agreed with this plan Palliative care consulted for goals of care-full scope for now   Acute on chronic diastolic CHF exacerbation -Diuresed with Lasix . -TTE LVEF 65%, G1 DD, mild to moderate TR, moderate atrial dilatation.  No any other significant finding   Hypokalemia/hypophosphatemia Supplemented.  Continue daily potassium.  Check labs periodically.   Iron  deficiency, Tsat 4%. IV Venofer  300 mg x 1 dose given Continue oral iron  supplement on discharge.   Acute L1 compression fracture On and off pain.  Lidocaine  patch has been ordered.  Seen by palliative care and they have also made changes to her medication regimen.  She is on Tylenol  around-the-clock.  She is on hydromorphone  orally on a as needed basis.  She is also on Robaxin  on as-needed basis.     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  - Eliquis  has been discontinued.  Patient remains at high risk for rebleeding on DOAC.  Would recommend to follow-up with cardiology to restart DOAC if able, not  a candidate for Watchman procedure Patient was seen by cardiology during this hospital stay but they have signed off now.   Constipation Continue with bowel regimen.  Had a good bowel movement in the last 24 hours.   CKD stage IIIa Renal function is stable.   Obesity Body mass index is 38.45 kg/m.     Patient remains stable.  Okay for discharge to SNF.   PERTINENT LABS:  The results of significant diagnostics from this hospitalization (including imaging, microbiology, ancillary and laboratory) are listed below for reference.     Labs:   Basic Metabolic Panel: Recent Labs  Lab 02/19/24 0516 02/20/24 0450 02/21/24 0509 02/23/24 0502  NA 139 140 142 140  K 3.8 3.2* 3.5 4.3  CL 107 103 106 106  CO2 25 28 26 24   GLUCOSE 78 83 86 77  BUN 38* 34* 28* 26*  CREATININE 1.43* 1.33* 1.21* 0.98  CALCIUM  8.0* 8.0* 8.2* 8.4*  MG  --  1.7  --  1.9    CBC: Recent Labs  Lab 02/19/24 0516 02/20/24 0450 02/21/24 0509 02/23/24 0502  WBC 5.9 5.8 6.6 5.3  HGB 8.4* 8.8* 8.5* 8.4*  HCT 30.0* 31.6* 32.0* 30.4*  MCV 98.7 98.4 97.9 98.4  PLT 170 164 146* 121*    BNP: BNP (last 3 results) Recent Labs    02/15/24 0503  BNP 633.1*    IMAGING STUDIES DG CHEST PORT 1 VIEW Result Date: 02/18/2024 EXAM: 1 VIEW XRAY OF THE CHEST 02/18/2024 09:05:00 AM COMPARISON: 02/14/2024 CLINICAL HISTORY: 02706 CHF (congestive heart failure) (HCC) 02706. CHF FINDINGS: LUNGS AND PLEURA: No focal pulmonary opacity. No pulmonary edema. Blunting of lateral costophrenic angles suggesting small pleural effusions. No pneumothorax. HEART AND MEDIASTINUM: No acute abnormality of the cardiac and mediastinal silhouettes. Aortic atherosclerosis (ICD10-I70.0). BONES AND SOFT TISSUES: Fracture deformity of the proximal left humerus again noted. IMPRESSION: 1. Blunting of lateral costophrenic angles suggesting small pleural effusions. 2. Aortic atherosclerosis. 3. Fracture deformity of the proximal left humerus.  Electronically signed by: Katheleen Faes MD 02/18/2024 12:52 PM EDT RP Workstation: [id number]   ECHOCARDIOGRAM COMPLETE Result Date: 02/15/2024    ECHOCARDIOGRAM REPORT   Patient Name:   Peggy Armstrong Date of Exam: 02/15/2024 Medical Rec #:  [id number]         Height:       60.0 in Accession #:    [id number]        Weight:       196.9 lb Date of Birth:  07-Sep-1945         BSA:          1.854 m Patient Age:    78 years          BP:           155/73 mmHg Patient Gender: F                 HR:           80 bpm. Exam Location:  Inpatient Procedure: 2D Echo, Color Doppler and Cardiac Doppler (Both Spectral and Color            Flow Doppler were utilized during procedure). Indications:    CHF  History:  Patient has prior history of Echocardiogram examinations, most                 recent 06/20/2023. Arrythmias:Atrial Flutter.  Sonographer:    Benard Stallion Referring Phys: [id number] DILEEP KUMAR IMPRESSIONS  1. Left ventricular ejection fraction, by estimation, is 60 to 65%. The left ventricle has normal function. The left ventricle has no regional wall motion abnormalities. Left ventricular diastolic parameters are consistent with Grade I diastolic dysfunction (impaired relaxation).  2. Right ventricular systolic function is normal. The right ventricular size is normal. There is mildly elevated pulmonary artery systolic pressure. The estimated right ventricular systolic pressure is 40.5 mmHg.  3. Left atrial size was moderately dilated.  4. Right atrial size was moderately dilated.  5. The mitral valve is normal in structure. No evidence of mitral valve regurgitation. No evidence of mitral stenosis.  6. Tricuspid valve regurgitation is mild to moderate.  7. The aortic valve is tricuspid. There is mild calcification of the aortic valve. Aortic valve regurgitation is not visualized. Aortic valve sclerosis/calcification is present, without any evidence of aortic stenosis.  8. Aortic dilatation noted. There is  borderline dilatation of the ascending aorta, measuring 38 mm.  9. The inferior vena cava is normal in size with greater than 50% respiratory variability, suggesting right atrial pressure of 3 mmHg. FINDINGS  Left Ventricle: Left ventricular ejection fraction, by estimation, is 60 to 65%. The left ventricle has normal function. The left ventricle has no regional wall motion abnormalities. The left ventricular internal cavity size was normal in size. There is  no left ventricular hypertrophy. Left ventricular diastolic parameters are consistent with Grade I diastolic dysfunction (impaired relaxation). Right Ventricle: The right ventricular size is normal. No increase in right ventricular wall thickness. Right ventricular systolic function is normal. There is mildly elevated pulmonary artery systolic pressure. The tricuspid regurgitant velocity is 2.85  m/s, and with an assumed right atrial pressure of 8 mmHg, the estimated right ventricular systolic pressure is 40.5 mmHg. Left Atrium: Left atrial size was moderately dilated. Right Atrium: Right atrial size was moderately dilated. Pericardium: There is no evidence of pericardial effusion. Mitral Valve: The mitral valve is normal in structure. No evidence of mitral valve regurgitation. No evidence of mitral valve stenosis. Tricuspid Valve: The tricuspid valve is normal in structure. Tricuspid valve regurgitation is mild to moderate. No evidence of tricuspid stenosis. Aortic Valve: The aortic valve is tricuspid. There is mild calcification of the aortic valve. Aortic valve regurgitation is not visualized. Aortic valve sclerosis/calcification is present, without any evidence of aortic stenosis. Aortic valve mean gradient measures 9.7 mmHg. Aortic valve peak gradient measures 17.2 mmHg. Aortic valve area, by VTI measures 2.15 cm. Pulmonic Valve: The pulmonic valve was normal in structure. Pulmonic valve regurgitation is trivial. No evidence of pulmonic stenosis. Aorta:  Aortic dilatation noted. There is borderline dilatation of the ascending aorta, measuring 38 mm. Venous: The inferior vena cava is normal in size with greater than 50% respiratory variability, suggesting right atrial pressure of 3 mmHg. IAS/Shunts: No atrial level shunt detected by color flow Doppler.  LEFT VENTRICLE PLAX 2D LVIDd:         4.90 cm   Diastology LVIDs:         3.20 cm   LV e' medial:    6.20 cm/s LV PW:         1.05 cm   LV E/e' medial:  15.3 LV IVS:        1.10  cm   LV e' lateral:   8.05 cm/s LVOT diam:     2.20 cm   LV E/e' lateral: 11.8 LV SV:         82 LV SV Index:   44 LVOT Area:     3.80 cm  RIGHT VENTRICLE RV Basal diam:  5.40 cm RV Mid diam:    4.30 cm RV S prime:     11.30 cm/s TAPSE (M-mode): 2.0 cm LEFT ATRIUM              Index        RIGHT ATRIUM           Index LA diam:        4.10 cm  2.21 cm/m   RA Area:     27.70 cm LA Vol (A2C):   125.0 ml 67.42 ml/m  RA Volume:   106.00 ml 57.17 ml/m LA Vol (A4C):   97.0 ml  52.32 ml/m LA Biplane Vol: 111.0 ml 59.87 ml/m  AORTIC VALVE AV Area (Vmax):    2.07 cm AV Area (Vmean):   2.05 cm AV Area (VTI):     2.15 cm AV Vmax:           207.33 cm/s AV Vmean:          140.333 cm/s AV VTI:            0.383 m AV Peak Grad:      17.2 mmHg AV Mean Grad:      9.7 mmHg LVOT Vmax:         113.00 cm/s LVOT Vmean:        75.600 cm/s LVOT VTI:          0.217 m LVOT/AV VTI ratio: 0.57  AORTA Ao Root diam: 2.60 cm Ao Asc diam:  3.80 cm MITRAL VALVE                TRICUSPID VALVE MV Area (PHT): 3.50 cm     TR Peak grad:   32.5 mmHg MV Decel Time: 217 msec     TR Vmax:        285.00 cm/s MV E velocity: 94.70 cm/s MV A velocity: 117.00 cm/s  SHUNTS MV E/A ratio:  0.81         Systemic VTI:  0.22 m                             Systemic Diam: 2.20 cm Toribio Fuel MD Electronically signed by Toribio Fuel MD Signature Date/Time: 02/15/2024/6:20:29 PM    Final    DG CHEST PORT 1 VIEW Result Date: 02/14/2024 CLINICAL DATA:  Shortness of breath EXAM:  PORTABLE CHEST 1 VIEW COMPARISON:  CT 10/20/2023 FINDINGS: Cardiomegaly with central vascular congestion. Small right-sided pleural effusion. Hazy edema or atelectasis at the bases. Aortic atherosclerosis. No pneumothorax. Old appearing bilateral rib fractures IMPRESSION: Cardiomegaly with central vascular congestion and small right pleural effusion. Hazy edema or atelectasis at the bases. Electronically Signed   By: [name] Bun M.D.   On: 02/14/2024 17:12   CT Lumbar Spine Wo Contrast Result Date: 02/10/2024 CLINICAL DATA:  Low back pain.  History of duodenal adenocarcinoma. EXAM: CT LUMBAR SPINE WITHOUT CONTRAST TECHNIQUE: Multidetector CT imaging of the lumbar spine was performed without intravenous contrast administration. Multiplanar CT image reconstructions were also generated. RADIATION DOSE REDUCTION: This exam was performed according to the departmental dose-optimization program which includes automated exposure  control, adjustment of the mA and/or kV according to patient size and/or use of iterative reconstruction technique. COMPARISON:  Lumbar spine CT 05/11/2023 and MRI 05/13/2023. Outside CT abdomen and pelvis 10/20/2023 from [facility]. FINDINGS: Segmentation: 5 lumbar type vertebrae. Alignment: Mild lumbar dextroscoliosis. Chronic grade 1 anterolisthesis of L4 on L5. Vertebrae: Chronic L2 burst fracture with severe central vertebral body height loss and 6 mm retropulsion, similar to the 10/20/2023 CT. Mild L3 compression fracture with 25% vertebral body height loss, new from 05/11/2023 and with mildly progressive height loss since 10/20/2023. New, likely acute L1 compression fracture with 25% height loss and no retropulsion. Hemangiomas in the T12 and L3 vertebral bodies. Old sacral insufficiency fractures. Old posterior right twelfth rib fracture. Paraspinal and other soft tissues: Aortic atherosclerosis. Subcutaneous edema throughout the back. Partially visualized small bowel lipoma. Partially  visualized embolization coils in the region of the duodenum and pancreatic head. Disc levels: L2 retropulsion is again noted to result in moderate spinal stenosis. Mild spinal stenosis at L4-5. IMPRESSION: 1. Acute L1 compression fracture. 2. Mild L3 compression fracture with progressive height loss since 10/20/2023. 3. 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 4.  Aortic Atherosclerosis (ICD10-I70.0). Electronically Signed   By: Dasie Hamburg M.D.   On: 02/10/2024 13:03    DISCHARGE EXAMINATION: Vitals:   02/24/24 2139 02/25/24 0500 02/25/24 0553 02/25/24 0733  BP:   (!) 101/58   Pulse:   (!) 58   Resp:   16   Temp:   97.7 F (36.5 C)   TempSrc:      SpO2: 98%  100% (P) 96%  Weight:  89.1 kg    Height:        General appearance: Awake alert.  In no distress Resp: Clear to auscultation bilaterally.  Normal effort Cardio: S1-S2 is normal regular.  No S3-S4.  No rubs murmurs or bruit GI: Abdomen is soft.  Nontender nondistended.  Bowel sounds are present normal.  No masses organomegaly   DISPOSITION: SNF  Discharge Instructions     Amb Referral to Intravenous Iron  Therapy   Complete by: As directed    You have been referred to [facility] Infusion team for IV Iron  Infusions. The infusion pharmacy team will reach out to you with appointment information.    Primary Diagnosis Code for IV Iron : D50.0 - Iron  deficiency Anemia secondary to blood loss (Chronic)   Secondary diagnosis code for IV iron : Other   Comment: Cancer, GIB   Call MD for:  difficulty breathing, headache or visual disturbances   Complete by: As directed    Call MD for:  extreme fatigue   Complete by: As directed    Call MD for:  persistant dizziness or light-headedness   Complete by: As directed    Call MD for:  persistant nausea and vomiting   Complete by: As directed    Call MD for:  severe uncontrolled pain   Complete by: As directed    Call MD for:  temperature >100.4    Complete by: As directed    Diet - low sodium heart healthy   Complete by: As directed    Discharge instructions   Complete by: As directed    Please review instructions on the discharge summary.  You were cared for by a hospitalist during your hospital stay. If you have any questions about your discharge medications or the care you received while you were in the hospital after you are discharged, you  can call the unit and asked to speak with the hospitalist on call if the hospitalist that took care of you is not available. Once you are discharged, your primary care physician will handle any further medical issues. Please note that NO REFILLS for any discharge medications will be authorized once you are discharged, as it is imperative that you return to your primary care physician (or establish a relationship with a primary care physician if you do not have one) for your aftercare needs so that they can reassess your need for medications and monitor your lab values. If you do not have a primary care physician, you can call [phone] for a physician referral.   Increase activity slowly   Complete by: As directed          Allergies as of 02/25/2024       Reactions   Codeine Nausea And Vomiting, Other (See Comments)   Made me very sick   Tape Other (See Comments)   Tears skin, as it is VERY THIN!!  - surgical tape   Oxycodone  Other (See Comments)   Hallucinations         Medication List     STOP taking these medications    diclofenac 50 MG EC tablet Commonly known as: VOLTAREN   Eliquis  2.5 MG Tabs tablet Generic drug: apixaban    Eliquis  5 MG Tabs tablet Generic drug: apixaban    torsemide  20 MG tablet Commonly known as: DEMADEX    traMADol  50 MG tablet Commonly known as: ULTRAM        TAKE these medications    acetaminophen  500 MG tablet Commonly known as: TYLENOL  Take 2 tablets (1,000 mg total) by mouth every 8 (eight) hours. What changed:  medication  strength how much to take when to take this reasons to take this   albuterol  108 (90 Base) MCG/ACT inhaler Commonly known as: VENTOLIN  HFA Inhale 1-2 puffs into the lungs every 6 (six) hours as needed for wheezing or shortness of breath.   allopurinol  300 MG tablet Commonly known as: ZYLOPRIM  Take 1 tablet (300 mg total) by mouth daily.   amiodarone  200 MG tablet Commonly known as: PACERONE  Take 1 tablet (200 mg total) by mouth 2 (two) times daily. What changed: when to take this   atorvastatin  20 MG tablet Commonly known as: LIPITOR Take 1 tablet (20 mg total) by mouth daily.   bisacodyl  10 MG suppository Commonly known as: DULCOLAX Place 1 suppository (10 mg total) rectally daily as needed for moderate constipation.   busPIRone  10 MG tablet Commonly known as: BUSPAR  TAKE 1 TABLET BY MOUTH TWICE A DAY   CALCIUM  600 + D PO Take 1 tablet by mouth in the morning.   cyanocobalamin  1000 MCG tablet Commonly known as: VITAMIN B12 Take 1,000 mcg by mouth daily. What changed: how much to take   diltiazem  300 MG 24 hr capsule Commonly known as: CARDIZEM  CD Take 1 capsule (300 mg total) by mouth daily. What changed:  medication strength how much to take   DULoxetine  20 MG capsule Commonly known as: CYMBALTA  TAKE 1 CAPSULE BY MOUTH EVERY DAY   ferrous sulfate  325 (65 FE) MG tablet Take 325 mg by mouth daily with breakfast.   folic acid  1 MG tablet Commonly known as: FOLVITE  Take 1 tablet (1 mg total) by mouth daily.   HYDROmorphone  2 MG tablet Commonly known as: DILAUDID  Take 1 tablet (2 mg total) by mouth every 4 (four) hours as needed for severe pain (pain score  7-10).   ipratropium-albuterol  0.5-2.5 (3) MG/3ML Soln Commonly known as: DUONEB Take 3 mLs by nebulization every 6 (six) hours as needed (for shortness of breath or wheezing).   Klor-Con  M20 20 MEQ tablet Generic drug: potassium chloride  SA Take 20 mEq by mouth in the morning and at bedtime.    levothyroxine  100 MCG tablet Commonly known as: SYNTHROID  Take 1 tablet (100 mcg total) by mouth daily before breakfast.   lidocaine  5 % Commonly known as: LIDODERM  Place 1 patch onto the skin daily. Remove & Discard patch within 12 hours or as directed by MD   melatonin 3 MG Tabs tablet Take 1 tablet (3 mg total) by mouth at bedtime as needed (insomnia). What changed: when to take this   methocarbamol  500 MG tablet Commonly known as: ROBAXIN  TAKE 1 TABLET BY MOUTH EVERY 6 HOURS AS NEEDED FOR MUSCLE SPASMS.   ondansetron  4 MG tablet Commonly known as: ZOFRAN  Take 4 mg by mouth every 6 (six) hours as needed for nausea or vomiting.   pantoprazole  40 MG tablet Commonly known as: PROTONIX  Take 1 tablet (40 mg total) by mouth 2 (two) times daily. What changed: when to take this   polyethylene glycol 17 g packet Commonly known as: MIRALAX  / GLYCOLAX  Take 17 g by mouth daily.   senna-docusate 8.6-50 MG tablet Commonly known as: Senokot-S Take 2 tablets by mouth 2 (two) times daily.   sertraline  25 MG tablet Commonly known as: ZOLOFT  Take 25 mg by mouth daily.   spironolactone  25 MG tablet Commonly known as: ALDACTONE  Take 0.5 tablets (12.5 mg total) by mouth daily.   sucralfate  1 g tablet Commonly known as: CARAFATE  Take 1 tablet (1 g total) by mouth 2 (two) times daily.   VITAMIN C PO Take 1 tablet by mouth in the morning.   VITAMIN D3 PO Take 1 capsule by mouth in the morning.          Contact information for follow-up providers     [facility] Follow up.   Specialty: Home Health Services Why: Home Health Physical Therapy/Occupational Therapy/Home Health Aide/Skilled Nursing (RN)/ Social Worker Contact information: [address] [place] [address] [place] [address] [phone]              Contact information for after-discharge care     Destination     [facility] [place], [place] .   Service: Skilled Nursing Contact information: [address] [place]  [address] [phone]                     TOTAL DISCHARGE TIME: 35 mins  Sollie Vultaggio  [facility] Pager on [email or web address]  02/25/2024, 9:06 AM

## 2024-02-26 DIAGNOSIS — I10 Essential (primary) hypertension: Secondary | ICD-10-CM | POA: Diagnosis not present

## 2024-02-26 DIAGNOSIS — E785 Hyperlipidemia, unspecified: Secondary | ICD-10-CM | POA: Diagnosis not present

## 2024-02-26 DIAGNOSIS — D649 Anemia, unspecified: Secondary | ICD-10-CM | POA: Diagnosis not present

## 2024-02-27 ENCOUNTER — Other Ambulatory Visit: Payer: Self-pay | Admitting: Hematology and Oncology

## 2024-02-27 ENCOUNTER — Telehealth (HOSPITAL_BASED_OUTPATIENT_CLINIC_OR_DEPARTMENT_OTHER): Payer: Self-pay

## 2024-02-27 DIAGNOSIS — Z800 Family history of malignant neoplasm of digestive organs: Secondary | ICD-10-CM

## 2024-02-27 DIAGNOSIS — C170 Malignant neoplasm of duodenum: Secondary | ICD-10-CM

## 2024-02-27 NOTE — Telephone Encounter (Signed)
 Need lab appt scheduled ASAP for today or tomorrow for CBC & CMP per hospital discharge. [name] wants follow up appt in 1 month. Left detailed msg for Cindy per DPR.

## 2024-02-28 ENCOUNTER — Ambulatory Visit

## 2024-02-28 DIAGNOSIS — C170 Malignant neoplasm of duodenum: Secondary | ICD-10-CM | POA: Diagnosis not present

## 2024-02-28 DIAGNOSIS — I484 Atypical atrial flutter: Secondary | ICD-10-CM | POA: Diagnosis not present

## 2024-02-28 DIAGNOSIS — E441 Mild protein-calorie malnutrition: Secondary | ICD-10-CM | POA: Diagnosis not present

## 2024-02-28 DIAGNOSIS — S32010D Wedge compression fracture of first lumbar vertebra, subsequent encounter for fracture with routine healing: Secondary | ICD-10-CM | POA: Diagnosis not present

## 2024-02-28 DIAGNOSIS — R2689 Other abnormalities of gait and mobility: Secondary | ICD-10-CM | POA: Diagnosis not present

## 2024-02-28 DIAGNOSIS — I5032 Chronic diastolic (congestive) heart failure: Secondary | ICD-10-CM | POA: Diagnosis not present

## 2024-02-28 DIAGNOSIS — M6281 Muscle weakness (generalized): Secondary | ICD-10-CM | POA: Diagnosis not present

## 2024-02-29 DIAGNOSIS — C170 Malignant neoplasm of duodenum: Secondary | ICD-10-CM | POA: Diagnosis not present

## 2024-02-29 DIAGNOSIS — D509 Iron deficiency anemia, unspecified: Secondary | ICD-10-CM | POA: Diagnosis not present

## 2024-02-29 DIAGNOSIS — I5032 Chronic diastolic (congestive) heart failure: Secondary | ICD-10-CM | POA: Diagnosis not present

## 2024-02-29 DIAGNOSIS — E039 Hypothyroidism, unspecified: Secondary | ICD-10-CM | POA: Diagnosis not present

## 2024-02-29 DIAGNOSIS — I4892 Unspecified atrial flutter: Secondary | ICD-10-CM | POA: Diagnosis not present

## 2024-02-29 DIAGNOSIS — N183 Chronic kidney disease, stage 3 unspecified: Secondary | ICD-10-CM | POA: Diagnosis not present

## 2024-03-01 ENCOUNTER — Encounter (HOSPITAL_BASED_OUTPATIENT_CLINIC_OR_DEPARTMENT_OTHER): Payer: Self-pay

## 2024-03-01 NOTE — Telephone Encounter (Signed)
 Daughter and pt have not called back. Letter mailed to pt.

## 2024-03-05 ENCOUNTER — Inpatient Hospital Stay (HOSPITAL_COMMUNITY): Admit: 2024-03-05 | Admitting: Surgery

## 2024-03-05 SURGERY — WHIPPLE PROCEDURE
Anesthesia: General

## 2024-03-06 DIAGNOSIS — C170 Malignant neoplasm of duodenum: Secondary | ICD-10-CM | POA: Diagnosis not present

## 2024-03-06 DIAGNOSIS — I5032 Chronic diastolic (congestive) heart failure: Secondary | ICD-10-CM | POA: Diagnosis not present

## 2024-03-06 DIAGNOSIS — R2689 Other abnormalities of gait and mobility: Secondary | ICD-10-CM | POA: Diagnosis not present

## 2024-03-06 DIAGNOSIS — M6281 Muscle weakness (generalized): Secondary | ICD-10-CM | POA: Diagnosis not present

## 2024-03-06 DIAGNOSIS — I484 Atypical atrial flutter: Secondary | ICD-10-CM | POA: Diagnosis not present

## 2024-03-06 DIAGNOSIS — E441 Mild protein-calorie malnutrition: Secondary | ICD-10-CM | POA: Diagnosis not present

## 2024-03-06 DIAGNOSIS — S32010D Wedge compression fracture of first lumbar vertebra, subsequent encounter for fracture with routine healing: Secondary | ICD-10-CM | POA: Diagnosis not present

## 2024-03-08 DIAGNOSIS — R0602 Shortness of breath: Secondary | ICD-10-CM | POA: Diagnosis not present

## 2024-03-12 ENCOUNTER — Emergency Department (HOSPITAL_COMMUNITY)
Admission: EM | Admit: 2024-03-12 | Discharge: 2024-03-12 | Disposition: A | Discharge status: Home / Self Care | Attending: Emergency Medicine | Admitting: Emergency Medicine

## 2024-03-12 ENCOUNTER — Emergency Department (HOSPITAL_COMMUNITY)

## 2024-03-12 ENCOUNTER — Encounter (HOSPITAL_COMMUNITY): Payer: Self-pay

## 2024-03-12 ENCOUNTER — Ambulatory Visit

## 2024-03-12 DIAGNOSIS — I130 Hypertensive heart and chronic kidney disease with heart failure and stage 1 through stage 4 chronic kidney disease, or unspecified chronic kidney disease: Secondary | ICD-10-CM | POA: Diagnosis not present

## 2024-03-12 DIAGNOSIS — Z79899 Other long term (current) drug therapy: Secondary | ICD-10-CM | POA: Diagnosis not present

## 2024-03-12 DIAGNOSIS — M4726 Other spondylosis with radiculopathy, lumbar region: Secondary | ICD-10-CM | POA: Diagnosis not present

## 2024-03-12 DIAGNOSIS — I509 Heart failure, unspecified: Secondary | ICD-10-CM | POA: Insufficient documentation

## 2024-03-12 DIAGNOSIS — M48061 Spinal stenosis, lumbar region without neurogenic claudication: Secondary | ICD-10-CM | POA: Diagnosis not present

## 2024-03-12 DIAGNOSIS — S32021A Stable burst fracture of second lumbar vertebra, initial encounter for closed fracture: Secondary | ICD-10-CM | POA: Diagnosis not present

## 2024-03-12 DIAGNOSIS — S42202A Unspecified fracture of upper end of left humerus, initial encounter for closed fracture: Secondary | ICD-10-CM | POA: Diagnosis not present

## 2024-03-12 DIAGNOSIS — F0390 Unspecified dementia without behavioral disturbance: Secondary | ICD-10-CM | POA: Insufficient documentation

## 2024-03-12 DIAGNOSIS — M545 Low back pain, unspecified: Secondary | ICD-10-CM | POA: Diagnosis not present

## 2024-03-12 DIAGNOSIS — Z85068 Personal history of other malignant neoplasm of small intestine: Secondary | ICD-10-CM | POA: Insufficient documentation

## 2024-03-12 DIAGNOSIS — S32512A Fracture of superior rim of left pubis, initial encounter for closed fracture: Secondary | ICD-10-CM | POA: Diagnosis not present

## 2024-03-12 DIAGNOSIS — I10 Essential (primary) hypertension: Secondary | ICD-10-CM | POA: Diagnosis not present

## 2024-03-12 DIAGNOSIS — M4316 Spondylolisthesis, lumbar region: Secondary | ICD-10-CM | POA: Diagnosis not present

## 2024-03-12 DIAGNOSIS — R0602 Shortness of breath: Secondary | ICD-10-CM | POA: Diagnosis not present

## 2024-03-12 DIAGNOSIS — N189 Chronic kidney disease, unspecified: Secondary | ICD-10-CM | POA: Insufficient documentation

## 2024-03-12 DIAGNOSIS — R918 Other nonspecific abnormal finding of lung field: Secondary | ICD-10-CM | POA: Diagnosis not present

## 2024-03-12 DIAGNOSIS — M549 Dorsalgia, unspecified: Secondary | ICD-10-CM | POA: Diagnosis not present

## 2024-03-12 DIAGNOSIS — R609 Edema, unspecified: Secondary | ICD-10-CM | POA: Diagnosis not present

## 2024-03-12 DIAGNOSIS — I70209 Unspecified atherosclerosis of native arteries of extremities, unspecified extremity: Secondary | ICD-10-CM | POA: Diagnosis not present

## 2024-03-12 DIAGNOSIS — S32010A Wedge compression fracture of first lumbar vertebra, initial encounter for closed fracture: Secondary | ICD-10-CM | POA: Diagnosis not present

## 2024-03-12 DIAGNOSIS — I700 Atherosclerosis of aorta: Secondary | ICD-10-CM | POA: Diagnosis not present

## 2024-03-12 DIAGNOSIS — R162 Hepatomegaly with splenomegaly, not elsewhere classified: Secondary | ICD-10-CM | POA: Diagnosis not present

## 2024-03-12 LAB — CBC WITH DIFFERENTIAL/PLATELET
Abs Immature Granulocytes: 0.04 K/uL (ref 0.00–0.07)
Basophils Absolute: 0 K/uL (ref 0.0–0.1)
Basophils Relative: 0 %
Eosinophils Absolute: 0.2 K/uL (ref 0.0–0.5)
Eosinophils Relative: 4 %
HCT: 37 % (ref 36.0–46.0)
Hemoglobin: 10.6 g/dL — ABNORMAL LOW (ref 12.0–15.0)
Immature Granulocytes: 1 %
Lymphocytes Relative: 10 %
Lymphs Abs: 0.5 K/uL — ABNORMAL LOW (ref 0.7–4.0)
MCH: 27 pg (ref 26.0–34.0)
MCHC: 28.6 g/dL — ABNORMAL LOW (ref 30.0–36.0)
MCV: 94.4 fL (ref 80.0–100.0)
Monocytes Absolute: 0.5 K/uL (ref 0.1–1.0)
Monocytes Relative: 10 %
Neutro Abs: 3.8 K/uL (ref 1.7–7.7)
Neutrophils Relative %: 75 %
Platelets: 170 K/uL (ref 150–400)
RBC: 3.92 MIL/uL (ref 3.87–5.11)
RDW: 17.2 % — ABNORMAL HIGH (ref 11.5–15.5)
WBC: 5.1 K/uL (ref 4.0–10.5)
nRBC: 0 % (ref 0.0–0.2)

## 2024-03-12 LAB — BASIC METABOLIC PANEL WITH GFR
Anion gap: 8 (ref 5–15)
BUN: 16 mg/dL (ref 8–23)
CO2: 25 mmol/L (ref 22–32)
Calcium: 8.5 mg/dL — ABNORMAL LOW (ref 8.9–10.3)
Chloride: 104 mmol/L (ref 98–111)
Creatinine, Ser: 1.13 mg/dL — ABNORMAL HIGH (ref 0.44–1.00)
GFR, Estimated: 50 mL/min — ABNORMAL LOW (ref >60)
Glucose, Bld: 94 mg/dL (ref 70–99)
Potassium: 4.7 mmol/L (ref 3.5–5.1)
Sodium: 137 mmol/L (ref 135–145)

## 2024-03-12 LAB — BRAIN NATRIURETIC PEPTIDE: B Natriuretic Peptide: 130.7 pg/mL — ABNORMAL HIGH (ref 0.0–100.0)

## 2024-03-12 LAB — TROPONIN I (HIGH SENSITIVITY)
Troponin I (High Sensitivity): 9 ng/L (ref <18)
Troponin I (High Sensitivity): 9 ng/L (ref <18)

## 2024-03-12 MED ORDER — HYDROMORPHONE HCL 2 MG PO TABS
2.0000 mg | ORAL_TABLET | Freq: Once | ORAL | Status: AC
  Administered 2024-03-12: 2 mg via ORAL
  Filled 2024-03-12: qty 1

## 2024-03-12 MED ORDER — LIDOCAINE 5 % EX PTCH
1.0000 | MEDICATED_PATCH | CUTANEOUS | Status: DC
  Administered 2024-03-12: 1 via TRANSDERMAL
  Filled 2024-03-12: qty 1

## 2024-03-12 MED ORDER — ACETAMINOPHEN 500 MG PO TABS
1000.0000 mg | ORAL_TABLET | Freq: Once | ORAL | Status: AC
  Administered 2024-03-12: 1000 mg via ORAL
  Filled 2024-03-12: qty 2

## 2024-03-12 MED ORDER — METHOCARBAMOL 500 MG PO TABS
500.0000 mg | ORAL_TABLET | Freq: Once | ORAL | Status: AC
  Administered 2024-03-12: 500 mg via ORAL
  Filled 2024-03-12: qty 1

## 2024-03-12 MED ORDER — LACTATED RINGERS IV BOLUS: 500.0000 mL | Freq: Once | INTRAVENOUS | Status: DC

## 2024-03-12 MED ORDER — IOHEXOL 350 MG/ML SOLN
100.0000 mL | Freq: Once | INTRAVENOUS | Status: AC | PRN
  Administered 2024-03-12: 100 mL via INTRAVENOUS

## 2024-03-12 MED ORDER — KETOROLAC TROMETHAMINE 15 MG/ML IJ SOLN
15.0000 mg | Freq: Once | INTRAMUSCULAR | Status: AC
  Administered 2024-03-12: 15 mg via INTRAVENOUS
  Filled 2024-03-12: qty 1

## 2024-03-12 MED ORDER — HYDROMORPHONE HCL 1 MG/ML IJ SOLN
0.5000 mg | Freq: Once | INTRAMUSCULAR | Status: AC
  Administered 2024-03-12: 0.5 mg via INTRAVENOUS
  Filled 2024-03-12: qty 1

## 2024-03-12 MED ORDER — METHOCARBAMOL 500 MG PO TABS
500.0000 mg | ORAL_TABLET | Freq: Once | ORAL | Status: AC
Start: 2024-03-12 — End: 2024-03-12
  Administered 2024-03-12: 500 mg via ORAL
  Filled 2024-03-12: qty 1

## 2024-03-12 MED ORDER — HYDROMORPHONE HCL 2 MG PO TABS: 2.0000 mg | ORAL_TABLET | ORAL | 0 refills | Status: DC | PRN

## 2024-03-12 MED ORDER — GADOBUTROL 1 MMOL/ML IV SOLN
8.0000 mL | Freq: Once | INTRAVENOUS | Status: AC | PRN
  Administered 2024-03-12: 8 mL via INTRAVENOUS

## 2024-03-12 MED ORDER — SODIUM CHLORIDE 0.9 % IV SOLN
510.0000 mg | Freq: Once | INTRAVENOUS | Status: AC
  Filled 2024-03-12: qty 17

## 2024-03-12 NOTE — ED Provider Notes (Signed)
 [facility] AT [facility] Provider Note   CSN: [id number] Arrival date & time: 03/12/24  9964     Patient presents with: Back Pain   Peggy Armstrong is a 78 y.o. female.   HPI Patient presents for back pain.  Medical history includes duodenal adenocarcinoma, arthritis, anemia, atrial flutter, CKD, gout, HTN, CHF, AAA, HLD, dementia, anxiety.  She was admitted a month ago for severe anemia secondary to GI bleed.  She underwent EGD which showed source of bleeding as malignant duodenal mass with slightly oozing.  PuraStat was injected.  She was also found to have an acute L1 compression fracture.  She has been staying at [facility] nursing facility.  She has a back brace which has been helping her pain.  She presents tonight for worsened pain.  Pain is stabbing in nature and comes in waves.  She denies any other recent symptoms.    Prior to Admission medications   Medication Sig Start Date End Date Taking? Authorizing Provider  acetaminophen  (TYLENOL ) 500 MG tablet Take 2 tablets (1,000 mg total) by mouth every 8 (eight) hours. 02/23/24   Verdene Purchase, MD  albuterol  (VENTOLIN  HFA) 108 [phone] Base) MCG/ACT inhaler Inhale 1-2 puffs into the lungs every 6 (six) hours as needed for wheezing or shortness of breath.    [provider]  allopurinol  (ZYLOPRIM ) 300 MG tablet Take 1 tablet (300 mg total) by mouth daily. 07/18/23   Rothfuss, Jacob T, PA-C  amiodarone  (PACERONE ) 200 MG tablet Take 1 tablet (200 mg total) by mouth 2 (two) times daily. 02/23/24   Krishnan, Gokul, MD  Ascorbic Acid (VITAMIN C PO) Take 1 tablet by mouth in the morning.    [provider]  atorvastatin  (LIPITOR) 20 MG tablet Take 1 tablet (20 mg total) by mouth daily. 12/19/22   Walker, Caitlin S, NP  bisacodyl  (DULCOLAX) 10 MG suppository Place 1 suppository (10 mg total) rectally daily as needed for moderate constipation. 02/23/24   Krishnan, Gokul, MD  busPIRone  (BUSPAR ) 10 MG  tablet TAKE 1 TABLET BY MOUTH TWICE A DAY 08/22/23   Rothfuss, Jacob T, PA-C  Calcium  Carb-Cholecalciferol  (CALCIUM  600 + D PO) Take 1 tablet by mouth in the morning.    [provider]  Cholecalciferol  (VITAMIN D3 PO) Take 1 capsule by mouth in the morning.    [provider]  cyanocobalamin  (VITAMIN B12) 1000 MCG tablet Take 1,000 mcg by mouth daily. Patient taking differently: Take 500-1,000 mcg by mouth daily.    [provider]  diltiazem  (CARDIZEM  CD) 300 MG 24 hr capsule Take 1 capsule (300 mg total) by mouth daily. 02/23/24   Krishnan, Gokul, MD  DULoxetine  (CYMBALTA ) 20 MG capsule TAKE 1 CAPSULE BY MOUTH EVERY DAY 10/23/23   Rothfuss, Jacob T, PA-C  ferrous sulfate  325 (65 FE) MG tablet Take 325 mg by mouth daily with breakfast.    [provider]  folic acid  (FOLVITE ) 1 MG tablet Take 1 tablet (1 mg total) by mouth daily. Patient not taking: Reported on 02/10/2024 05/18/23   Verdene Purchase, MD  HYDROmorphone  (DILAUDID ) 2 MG tablet Take 1 tablet (2 mg total) by mouth every 4 (four) hours as needed for severe pain (pain score 7-10). 02/23/24   Verdene Purchase, MD  ipratropium-albuterol  (DUONEB) 0.5-2.5 (3) MG/3ML SOLN Take 3 mLs by nebulization every 6 (six) hours as needed (for shortness of breath or wheezing).    [provider]  KLOR-CON  M20 20 MEQ tablet Take 20  mEq by mouth in the morning and at bedtime.    [provider]  levothyroxine  (SYNTHROID ) 100 MCG tablet Take 1 tablet (100 mcg total) by mouth daily before breakfast. 07/18/23   Rothfuss, Jacob T, PA-C  lidocaine  (LIDODERM ) 5 % Place 1 patch onto the skin daily. Remove & Discard patch within 12 hours or as directed by MD 02/24/24   Krishnan, Gokul, MD  melatonin 3 MG TABS tablet Take 1 tablet (3 mg total) by mouth at bedtime as needed (insomnia). Patient taking differently: Take 3 mg by mouth at bedtime. 05/18/23   Krishnan, Gokul, MD  methocarbamol  (ROBAXIN ) 500 MG tablet TAKE 1  TABLET BY MOUTH EVERY 6 HOURS AS NEEDED FOR MUSCLE SPASMS. 10/03/23   Rothfuss, Jacob T, PA-C  ondansetron  (ZOFRAN ) 4 MG tablet Take 4 mg by mouth every 6 (six) hours as needed for nausea or vomiting.    [provider]  pantoprazole  (PROTONIX ) 40 MG tablet Take 1 tablet (40 mg total) by mouth 2 (two) times daily. 02/23/24   Krishnan, Gokul, MD  polyethylene glycol (MIRALAX  / GLYCOLAX ) 17 g packet Take 17 g by mouth daily. 02/24/24   Krishnan, Gokul, MD  senna-docusate (SENOKOT-S) 8.6-50 MG tablet Take 2 tablets by mouth 2 (two) times daily. 02/23/24   Krishnan, Gokul, MD  sertraline  (ZOLOFT ) 25 MG tablet Take 25 mg by mouth daily.    [provider]  spironolactone  (ALDACTONE ) 25 MG tablet Take 0.5 tablets (12.5 mg total) by mouth daily. 02/24/24   Krishnan, Gokul, MD  sucralfate  (CARAFATE ) 1 g tablet Take 1 tablet (1 g total) by mouth 2 (two) times daily. 02/23/24   Krishnan, Gokul, MD    Allergies: Codeine, Tape, and Oxycodone     Review of Systems  Musculoskeletal:  Positive for back pain.  All other systems reviewed and are negative.   Updated Vital Signs BP (!) 153/81 (BP Location: Right Arm)   Pulse 62   Temp 97.9 F (36.6 C) (Oral)   Resp 18   SpO2 100%   Physical Exam Vitals and nursing note reviewed.  Constitutional:      General: She is not in acute distress.    Appearance: Normal appearance. She is well-developed. She is not ill-appearing, toxic-appearing or diaphoretic.  HENT:     Head: Normocephalic and atraumatic.     Right Ear: External ear normal.     Left Ear: External ear normal.     Nose: Nose normal.     Mouth/Throat:     Mouth: Mucous membranes are moist.  Eyes:     Extraocular Movements: Extraocular movements intact.     Conjunctiva/sclera: Conjunctivae normal.  Cardiovascular:     Rate and Rhythm: Normal rate and regular rhythm.     Heart sounds: No murmur heard. Pulmonary:     Effort: Pulmonary effort is normal. No respiratory distress.   Abdominal:     General: There is no distension.     Palpations: Abdomen is soft.  Musculoskeletal:        General: No swelling. Normal range of motion.     Cervical back: Normal range of motion.  Skin:    General: Skin is warm and dry.  Neurological:     General: No focal deficit present.     Mental Status: She is alert and oriented to person, place, and time.  Psychiatric:        Mood and Affect: Mood normal.        Behavior: Behavior normal.     (  all labs ordered are listed, but only abnormal results are displayed) Labs Reviewed  CBC WITH DIFFERENTIAL/PLATELET - Abnormal; Notable for the following components:      Result Value   Hemoglobin 10.6 (*)    MCHC 28.6 (*)    RDW 17.2 (*)    Lymphs Abs 0.5 (*)    All other components within normal limits  BASIC METABOLIC PANEL WITH GFR - Abnormal; Notable for the following components:   Creatinine, Ser 1.13 (*)    Calcium  8.5 (*)    GFR, Estimated 50 (*)    All other components within normal limits  BRAIN NATRIURETIC PEPTIDE - Abnormal; Notable for the following components:   B Natriuretic Peptide 130.7 (*)    All other components within normal limits  URINALYSIS, ROUTINE W REFLEX MICROSCOPIC  TROPONIN I (HIGH SENSITIVITY)  TROPONIN I (HIGH SENSITIVITY)    EKG: EKG Interpretation Date/Time:  Tuesday March 12 2024 03:12:08 EDT Ventricular Rate:  60 PR Interval:  208 QRS Duration:  100 QT Interval:  488 QTC Calculation: 488 R Axis:   -47  Text Interpretation: Normal sinus rhythm Left anterior fascicular block Confirmed by Melvenia Motto ([id number]) on 03/12/2024 3:42:58 AM  Radiology: CT Angio Chest/Abd/Pel for Dissection W and/or Wo Contrast Result Date: 03/12/2024 EXAM: CTA CHEST, ABDOMEN AND PELVIS WITHOUT AND WITH CONTRAST 03/12/2024 04:16:28 AM TECHNIQUE: CTA of the chest was performed without and with the administration of intravenous contrast. CTA of the abdomen and pelvis was performed without and with the  administration of intravenous contrast. Multiplanar reformatted images are provided for review. MIP images are provided for review. Automated exposure control, iterative reconstruction, and/or weight based adjustment of the mA/kV was utilized to reduce the radiation dose to as low as reasonably achievable. COMPARISON: None available. CLINICAL HISTORY: Acute aortic syndrome (AAS) suspected. 100cc [id number]; Pt complains of worsening low back pain. Hx of L1 fx. States that she feels worse than normal. States that the pain takes her breath away. Hx of CHF. 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 The ascending limb measures approximately 3.7 x 3.7 cm in cross-sectional diameter. There is mild calcific atheromatous disease. There is moderate calcific atheromatous disease within the abdominal aorta. No dissection. PULMONARY ARTERIES: The main pulmonary arteries are dilated, measuring approximately 3 cm in diameter. The pulmonary arteries are widely patent. No pulmonary embolism with the limits of this exam. GREAT VESSELS OF AORTIC ARCH: No acute finding. No dissection. No arterial occlusion or significant stenosis. CELIAC TRUNK: No acute finding. No occlusion or significant stenosis. SUPERIOR MESENTERIC ARTERY: No acute finding. No occlusion or significant stenosis. INFERIOR MESENTERIC ARTERY: No acute finding. No occlusion or significant stenosis. RENAL ARTERIES: The renal arteries are widely patent. No acute finding. No occlusion or significant stenosis. ILIAC ARTERIES: There is moderate calcific atheromatous disease within the common iliac arteries. The common iliac, external iliac and internal iliac arteries are patent. No occlusion or significant stenosis. CHEST: MEDIASTINUM: The heart is enlarged. There is mild calcific coronary artery disease present. No mediastinal lymphadenopathy. The pericardium demonstrates no acute abnormality. LUNGS AND PLEURA: There is mild dependent  atelectasis within the lower lobes bilaterally. There is a mild-to-moderate right-sided pleural effusion and a small left effusion. No focal consolidation or pulmonary edema. No evidence of pneumothorax. THORACIC BONES AND SOFT TISSUES: There are healed fracture deformities of the right posterior rib cage. There is a chronic impacted fracture deformity of the left humeral head and neck. ABDOMEN AND PELVIS: LIVER: The liver  is enlarged, measuring approximately 19 cm in length in the midclavicular line. There is mild pneumobilia. GALLBLADDER AND BILE DUCTS: The patient is status post cholecystectomy. SPLEEN: The spleen is enlarged, measuring 14 cm in length. PANCREAS: The pancreas is unremarkable. ADRENAL GLANDS: Bilateral adrenal glands demonstrate no acute abnormality. KIDNEYS, URETERS AND BLADDER: There is a simple cyst arising superior medially from the superior pole of the left kidney. No stones in the kidneys or ureters. No hydronephrosis. No perinephric or periureteral stranding. Urinary bladder is unremarkable. GI AND BOWEL: There is a moderate sliding hiatus hernia. Stomach and duodenal sweep demonstrate no acute abnormality. There is no bowel obstruction. No abnormal bowel wall thickening or distension. REPRODUCTIVE: The patient is status post hysterectomy and bilateral salpingo-oophorectomy. PERITONEUM AND RETROPERITONEUM: No ascites or free air. LYMPH NODES: No lymphadenopathy. ABDOMINAL BONES AND SOFT TISSUES: There is a chronic burst fracture of L2. There are mild compression deformities of L1 and L3. There are healed fracture deformities of the left superior and inferior pubic rami. No acute soft tissue abnormality. IMPRESSION: 1. No evidence of acute aortic syndrome. 2. 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 Moderate calcific atheromatous disease within the abdominal aorta and common iliac arteries. 3. Enlarged heart with mild calcific coronary artery disease and  dilated main pulmonary arteries. Mild-to-moderate right-sided pleural effusion and small left effusion. 4. Hepatomegaly and splenomegaly. 5. Chronic burst fracture of L2 and mild compression deformities of L1 and L3. Electronically signed by: Evalene Coho MD 03/12/2024 04:45 AM EDT RP Workstation: [id number]   CT L-SPINE NO CHARGE Result Date: 03/12/2024 EXAM: CT OF THE LUMBAR SPINE WITHOUT CONTRAST 03/12/2024 04:16:28 AM TECHNIQUE: CT of the lumbar spine was performed without the administration of intravenous contrast. Multiplanar reformatted images are provided for review. Automated exposure control, iterative reconstruction, and/or weight based adjustment of the mA/kV was utilized to reduce the radiation dose to as low as reasonably achievable. COMPARISON: CT of the lumbar spine dated 02/10/2024. CLINICAL HISTORY: Lumbar radiculopathy, trauma. Pt complains of worsening low back pain. Hx of L1 fx. States that she feels worse than normal. States that the pain takes her breath away. Hx of CHF. FINDINGS: BONES AND ALIGNMENT: A previously noted compression fracture of L1 has slightly worsened in the interim. It previously measured approximately 19 mm in height anteriorly and now measures 17 mm. There is diffuse downward bowing of the superior endplate. There is a chronic burst fracture of L2 again demonstrated with retropulsion of the posterior superior corner by approximately 6 mm resulting in moderate focal central spinal canal stenosis, similar to the prior study. Bowing of the superior endplate of L3, which is unchanged. There is a mild dextrocurvature of the thoracolumbar spine. DEGENERATIVE CHANGES: There is moderate bilateral facet arthrosis at L3-4, L4-5 and L5-S1. SOFT TISSUES: There is a simple cyst arising posteromedially from the superior pole of the left kidney. IMPRESSION: 1. Slightly worsened compression fracture of L1 with diffuse downward bowing of the superior endplate. 2. Chronic burst  fracture of L2 with retropulsion of the posterior superior corner by approximately 6 mm, resulting in moderate focal central spinal canal stenosis, similar to the prior study. 3. Bowing of the superior endplate of L3, unchanged. 4. Mild dextrocurvature of the thoracolumbar spine. 5. Moderate bilateral facet arthrosis at L3-4, L4-5, and L5-S1. Electronically signed by: Evalene Coho MD 03/12/2024 04:32 AM EDT RP Workstation: [id number]   DG Chest Port 1 View Result Date: 03/12/2024 EXAM: 1 VIEW XRAY OF THE CHEST 03/12/2024  03:04:49 AM COMPARISON: 02/18/2024 CLINICAL HISTORY: Shortness of breath and back pain. FINDINGS: LUNGS AND PLEURA: Diffuse interstitial opacities have developed in keeping with mild pulmonary edema. Partial right lower lobe collapse with opacification of the right cardiophrenic angle. HEART AND MEDIASTINUM: Cardiomegaly. Tortuous aorta. Aortic atherosclerosis. BONES AND SOFT TISSUES: Chronic healing left proximal humerus fracture. IMPRESSION: 1. Partial right lower lobe collapse with opacification of the right cardiophrenic angle. 2. Diffuse interstitial opacities in keeping with mild pulmonary edema. 3. Cardiomegaly Electronically signed by: Dorethia Molt MD 03/12/2024 03:16 AM EDT RP Workstation: [id number]     Procedures   Medications Ordered in the ED  HYDROmorphone  (DILAUDID ) injection 0.5 mg (has no administration in time range)  methocarbamol  (ROBAXIN ) tablet 500 mg (has no administration in time range)  lidocaine  (LIDODERM ) 5 % 1 patch (has no administration in time range)  HYDROmorphone  (DILAUDID ) injection 0.5 mg (0.5 mg Intravenous Given 03/12/24 0315)  methocarbamol  (ROBAXIN ) tablet 500 mg (500 mg Oral Given 03/12/24 0448)  iohexol  (OMNIPAQUE ) 350 MG/ML injection 100 mL (100 mLs Intravenous Contrast Given 03/12/24 0417)  ketorolac  (TORADOL ) 15 MG/ML injection 15 mg (15 mg Intravenous Given 03/12/24 0541)                                    Medical Decision  Making Amount and/or Complexity of Data Reviewed Labs: ordered. Radiology: ordered. ECG/medicine tests: ordered.  Risk Prescription drug management.   This patient presents to the ED for concern of back pain, this involves an extensive number of treatment options, and is a complaint that carries with it a high risk of complications and morbidity.  The differential diagnosis includes uncontrolled chronic pain, new injury, AAA, retroperitoneal hemorrhage, neoplasm   Co morbidities / Chronic conditions that complicate the patient evaluation  duodenal adenocarcinoma, arthritis, anemia, atrial flutter, CKD, gout, HTN, CHF, AAA, HLD, dementia, anxiety   Additional history obtained:  Additional history obtained from EMR External records from outside source obtained and reviewed including N/A   Lab Tests:  I Ordered, and personally interpreted labs.  The pertinent results include: Hemoglobin improved from prior lab work, BNP improved as well.  Normal kidney function, normal electrolytes, normal troponin   Imaging Studies ordered:  I ordered imaging studies including chest x-ray, CT dissection study, CT of L-spine I independently visualized and interpreted imaging which showed right worsening of known L1 compression fracture without other acute findings I agree with the radiologist interpretation   Cardiac Monitoring: / EKG:  The patient was maintained on a cardiac monitor.  I personally viewed and interpreted the cardiac monitored which showed an underlying rhythm of: Sinus rhythm   Problem List / ED Course / Critical interventions / Medication management  Patient presenting for acute on chronic low back pain.  On arrival in the ED, she is overall well-appearing.  She did have a recent hospital admission, where they identified an L1 fracture.  She also has chronic lumbar fractures.  She describes her current area of pain is L3 area.  She has no new focal deficits.  Pain is  described as stabbing pain that comes in waves.  Description of pain consistent with muscle spasms.  Dose of Robaxin  was ordered in addition to Dilaudid .  Workup was initiated.  Patient's lab work is reassuring.  Last month she did have an episode of severe anemia but hemoglobin has continued to improve.  BNP has also improved since her prior  hospitalization.  Imaging studies showed slight worsening of her recent L1 compression fracture.  Showed redemonstration of other known chronic fractures which do not appear to have worsened.  On reassessment, patient Dors is ongoing severe pain.  Toradol  and additional dose of Dilaudid  was ordered.  On further reassessment, patient has had only minimal relief.  Will obtain MRI.  Additional Robaxin  was ordered.  Patient currently has a lidocaine  patch in area of T12.  This was removed.  Will place new patch to area of lower lumbar spine.  Care of patient was signed out to oncoming ED provider. I ordered medication including Dilaudid , Robaxin , Toradol , lidocaine  patch for analgesia Reevaluation of the patient after these medicines showed that the patient only slightly improved I have reviewed the patients home medicines and have made adjustments as needed   Social Determinants of Health:  Resides in nursing facility      Final diagnoses:  Midline low back pain without sciatica, unspecified chronicity    ED Discharge Orders     None          Melvenia Motto, MD 03/12/24 [phone]
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
## 2024-03-12 NOTE — ED Provider Notes (Signed)
 Patient checked out by Dr. Melvenia with worsening back pain over the last 3 to 4 days.  Patient denies any injuries but has known back issues and wears a back brace at the skilled facility she is at.  Patient is already on Dilaudid , methocarbamol  and Tylenol .  Initially patient was having very hard to control back pain however now she seems a little more comfortable.  MRI results show interval development of mild to moderate compression deformity of L1 with bone marrow edema and reactive enhancement indicating that is an acute versus subacute fracture.  No other acute changes.  Patient's labs are unchanged and CTA did not show any new findings.  All this was discussed with the patient.  At this time she would like to try going back to her facility.  Will have her continue the medication she is on but will have her increase the Dilaudid  to 2 or 4 mg as needed every 4 hours for better pain control.  She has her brace to wear at home and will wear that anytime she is up and moving around.   Doretha Folks, MD 03/12/24 [phone]

## 2024-03-12 NOTE — ED Notes (Signed)
 Called [facility] for pt pick

## 2024-03-12 NOTE — ED Provider Triage Note (Signed)
 Emergency Medicine Provider Triage Evaluation Note  Peggy Armstrong , a 78 y.o. female  was evaluated in triage.  Pt complains of worsening low back pain.  Hx of L1 fx.  States that she feels worse than normal.  States that the pain takes her breath away.  Hx of CHF.  Review of Systems  Positive: Back pain Negative: fever  Physical Exam  BP (!) 153/81 (BP Location: Right Arm)   Pulse 62   Temp 97.8 F (36.6 C)   Resp 18   SpO2 100%  Gen:   Awake, no distress   Resp:  Normal effort  MSK:   Moves extremities without difficulty  Other:    Medical Decision Making  Medically screening exam initiated at 2:51 AM.  Appropriate orders placed.  AVEA MCGOWEN was informed that the remainder of the evaluation will be completed by another provider, this initial triage assessment does not replace that evaluation, and the importance of remaining in the ED until their evaluation is complete.     Vicky Charleston, PA-C 03/12/24 [phone]

## 2024-03-12 NOTE — Discharge Instructions (Addendum)
 The MRI today shows that you have a new compression fracture of L1.  This will have to heal on its own.  You should continue wearing your back brace anytime you are up and moving around and they will most likely need to adjust physical therapy for you.  Continue to take the muscle relaxer Robaxin  as prescribed as well as Tylenol  every 8 hours.  However for the Dilaudid  that you have scheduled every 4 hours you can now take 2 or 4 mg as needed for pain.  You can use the oxygen as needed which is already prescribed for you.  You will need to follow-up with your doctor if the pain is not improving.

## 2024-03-13 DIAGNOSIS — R2689 Other abnormalities of gait and mobility: Secondary | ICD-10-CM | POA: Diagnosis not present

## 2024-03-13 DIAGNOSIS — M6281 Muscle weakness (generalized): Secondary | ICD-10-CM | POA: Diagnosis not present

## 2024-03-13 DIAGNOSIS — C170 Malignant neoplasm of duodenum: Secondary | ICD-10-CM | POA: Diagnosis not present

## 2024-03-13 DIAGNOSIS — I484 Atypical atrial flutter: Secondary | ICD-10-CM | POA: Diagnosis not present

## 2024-03-13 DIAGNOSIS — I5032 Chronic diastolic (congestive) heart failure: Secondary | ICD-10-CM | POA: Diagnosis not present

## 2024-03-13 DIAGNOSIS — S32010D Wedge compression fracture of first lumbar vertebra, subsequent encounter for fracture with routine healing: Secondary | ICD-10-CM | POA: Diagnosis not present

## 2024-03-13 DIAGNOSIS — E441 Mild protein-calorie malnutrition: Secondary | ICD-10-CM | POA: Diagnosis not present

## 2024-03-15 ENCOUNTER — Inpatient Hospital Stay

## 2024-03-15 ENCOUNTER — Inpatient Hospital Stay: Admitting: Oncology

## 2024-03-19 ENCOUNTER — Ambulatory Visit

## 2024-03-20 DIAGNOSIS — C170 Malignant neoplasm of duodenum: Secondary | ICD-10-CM | POA: Diagnosis not present

## 2024-03-20 DIAGNOSIS — R2689 Other abnormalities of gait and mobility: Secondary | ICD-10-CM | POA: Diagnosis not present

## 2024-03-20 DIAGNOSIS — I5032 Chronic diastolic (congestive) heart failure: Secondary | ICD-10-CM | POA: Diagnosis not present

## 2024-03-20 DIAGNOSIS — S32010D Wedge compression fracture of first lumbar vertebra, subsequent encounter for fracture with routine healing: Secondary | ICD-10-CM | POA: Diagnosis not present

## 2024-03-20 DIAGNOSIS — I484 Atypical atrial flutter: Secondary | ICD-10-CM | POA: Diagnosis not present

## 2024-03-20 DIAGNOSIS — E441 Mild protein-calorie malnutrition: Secondary | ICD-10-CM | POA: Diagnosis not present

## 2024-03-20 DIAGNOSIS — M6281 Muscle weakness (generalized): Secondary | ICD-10-CM | POA: Diagnosis not present

## 2024-03-27 DIAGNOSIS — S32010D Wedge compression fracture of first lumbar vertebra, subsequent encounter for fracture with routine healing: Secondary | ICD-10-CM | POA: Diagnosis not present

## 2024-03-27 DIAGNOSIS — I484 Atypical atrial flutter: Secondary | ICD-10-CM | POA: Diagnosis not present

## 2024-03-27 DIAGNOSIS — M6281 Muscle weakness (generalized): Secondary | ICD-10-CM | POA: Diagnosis not present

## 2024-03-27 DIAGNOSIS — C170 Malignant neoplasm of duodenum: Secondary | ICD-10-CM | POA: Diagnosis not present

## 2024-03-27 DIAGNOSIS — I5032 Chronic diastolic (congestive) heart failure: Secondary | ICD-10-CM | POA: Diagnosis not present

## 2024-03-27 DIAGNOSIS — E441 Mild protein-calorie malnutrition: Secondary | ICD-10-CM | POA: Diagnosis not present

## 2024-03-27 DIAGNOSIS — R2689 Other abnormalities of gait and mobility: Secondary | ICD-10-CM | POA: Diagnosis not present

## 2024-03-31 ENCOUNTER — Other Ambulatory Visit: Payer: Self-pay

## 2024-03-31 ENCOUNTER — Inpatient Hospital Stay (HOSPITAL_COMMUNITY)
Admission: EM | Admit: 2024-03-31 | Discharge: 2024-04-04 | DRG: 543 | Disposition: A | Discharge status: Home Health | Attending: Family Medicine | Admitting: Family Medicine

## 2024-03-31 ENCOUNTER — Encounter (HOSPITAL_COMMUNITY): Payer: Self-pay

## 2024-03-31 DIAGNOSIS — Z8509 Personal history of malignant neoplasm of other digestive organs: Secondary | ICD-10-CM

## 2024-03-31 DIAGNOSIS — C170 Malignant neoplasm of duodenum: Secondary | ICD-10-CM | POA: Diagnosis not present

## 2024-03-31 DIAGNOSIS — Z79899 Other long term (current) drug therapy: Secondary | ICD-10-CM

## 2024-03-31 DIAGNOSIS — N1832 Chronic kidney disease, stage 3b: Secondary | ICD-10-CM | POA: Diagnosis present

## 2024-03-31 DIAGNOSIS — Z86718 Personal history of other venous thrombosis and embolism: Secondary | ICD-10-CM

## 2024-03-31 DIAGNOSIS — Z6832 Body mass index (BMI) 32.0-32.9, adult: Secondary | ICD-10-CM

## 2024-03-31 DIAGNOSIS — D631 Anemia in chronic kidney disease: Secondary | ICD-10-CM | POA: Diagnosis not present

## 2024-03-31 DIAGNOSIS — F32A Depression, unspecified: Secondary | ICD-10-CM | POA: Diagnosis present

## 2024-03-31 DIAGNOSIS — I130 Hypertensive heart and chronic kidney disease with heart failure and stage 1 through stage 4 chronic kidney disease, or unspecified chronic kidney disease: Secondary | ICD-10-CM | POA: Diagnosis present

## 2024-03-31 DIAGNOSIS — I484 Atypical atrial flutter: Secondary | ICD-10-CM | POA: Diagnosis not present

## 2024-03-31 DIAGNOSIS — M5416 Radiculopathy, lumbar region: Secondary | ICD-10-CM | POA: Diagnosis present

## 2024-03-31 DIAGNOSIS — Z885 Allergy status to narcotic agent status: Secondary | ICD-10-CM

## 2024-03-31 DIAGNOSIS — M4856XA Collapsed vertebra, not elsewhere classified, lumbar region, initial encounter for fracture: Principal | ICD-10-CM | POA: Diagnosis present

## 2024-03-31 DIAGNOSIS — Z800 Family history of malignant neoplasm of digestive organs: Secondary | ICD-10-CM

## 2024-03-31 DIAGNOSIS — M4826 Kissing spine, lumbar region: Secondary | ICD-10-CM | POA: Diagnosis not present

## 2024-03-31 DIAGNOSIS — Z79890 Hormone replacement therapy (postmenopausal): Secondary | ICD-10-CM

## 2024-03-31 DIAGNOSIS — Z8261 Family history of arthritis: Secondary | ICD-10-CM

## 2024-03-31 DIAGNOSIS — S32010A Wedge compression fracture of first lumbar vertebra, initial encounter for closed fracture: Secondary | ICD-10-CM | POA: Diagnosis not present

## 2024-03-31 DIAGNOSIS — M109 Gout, unspecified: Secondary | ICD-10-CM | POA: Diagnosis present

## 2024-03-31 DIAGNOSIS — I5032 Chronic diastolic (congestive) heart failure: Secondary | ICD-10-CM | POA: Diagnosis not present

## 2024-03-31 DIAGNOSIS — M48061 Spinal stenosis, lumbar region without neurogenic claudication: Secondary | ICD-10-CM | POA: Diagnosis present

## 2024-03-31 DIAGNOSIS — Z91048 Other nonmedicinal substance allergy status: Secondary | ICD-10-CM

## 2024-03-31 DIAGNOSIS — E785 Hyperlipidemia, unspecified: Secondary | ICD-10-CM | POA: Diagnosis present

## 2024-03-31 DIAGNOSIS — Z833 Family history of diabetes mellitus: Secondary | ICD-10-CM

## 2024-03-31 DIAGNOSIS — I480 Paroxysmal atrial fibrillation: Secondary | ICD-10-CM | POA: Diagnosis not present

## 2024-03-31 DIAGNOSIS — R109 Unspecified abdominal pain: Secondary | ICD-10-CM | POA: Diagnosis not present

## 2024-03-31 DIAGNOSIS — Z7901 Long term (current) use of anticoagulants: Secondary | ICD-10-CM | POA: Diagnosis not present

## 2024-03-31 DIAGNOSIS — E039 Hypothyroidism, unspecified: Secondary | ICD-10-CM | POA: Diagnosis not present

## 2024-03-31 DIAGNOSIS — M545 Low back pain, unspecified: Secondary | ICD-10-CM | POA: Diagnosis not present

## 2024-03-31 DIAGNOSIS — E66811 Obesity, class 1: Secondary | ICD-10-CM | POA: Diagnosis present

## 2024-03-31 DIAGNOSIS — G8929 Other chronic pain: Secondary | ICD-10-CM | POA: Diagnosis present

## 2024-03-31 DIAGNOSIS — R269 Unspecified abnormalities of gait and mobility: Secondary | ICD-10-CM

## 2024-03-31 DIAGNOSIS — Z87891 Personal history of nicotine dependence: Secondary | ICD-10-CM | POA: Diagnosis not present

## 2024-03-31 DIAGNOSIS — M069 Rheumatoid arthritis, unspecified: Secondary | ICD-10-CM | POA: Diagnosis not present

## 2024-03-31 DIAGNOSIS — S32021A Stable burst fracture of second lumbar vertebra, initial encounter for closed fracture: Secondary | ICD-10-CM | POA: Diagnosis not present

## 2024-03-31 DIAGNOSIS — M8580 Other specified disorders of bone density and structure, unspecified site: Secondary | ICD-10-CM | POA: Diagnosis present

## 2024-03-31 DIAGNOSIS — G3109 Other frontotemporal dementia: Secondary | ICD-10-CM | POA: Diagnosis present

## 2024-03-31 DIAGNOSIS — M549 Dorsalgia, unspecified: Secondary | ICD-10-CM | POA: Diagnosis not present

## 2024-03-31 DIAGNOSIS — F0284 Dementia in other diseases classified elsewhere, unspecified severity, with anxiety: Secondary | ICD-10-CM | POA: Diagnosis present

## 2024-03-31 DIAGNOSIS — F0283 Dementia in other diseases classified elsewhere, unspecified severity, with mood disturbance: Secondary | ICD-10-CM | POA: Diagnosis present

## 2024-03-31 DIAGNOSIS — Z8249 Family history of ischemic heart disease and other diseases of the circulatory system: Secondary | ICD-10-CM | POA: Diagnosis not present

## 2024-03-31 MED ORDER — HYDROMORPHONE HCL 1 MG/ML IJ SOLN
1.0000 mg | Freq: Once | INTRAMUSCULAR | Status: AC
  Administered 2024-04-01: 1 mg via INTRAVENOUS
  Filled 2024-03-31: qty 1

## 2024-03-31 NOTE — ED Triage Notes (Signed)
 BIBA from home, Back pain with recurrent fractures for about 1 year, last fracture about 3 weeks ago and released from [facility] today around 2pm.  C/O unmanageable back pain  170/64 P66 96% RA

## 2024-03-31 NOTE — ED Provider Notes (Signed)
 Peggy Armstrong   CSN: [id number] Arrival date & time: 03/31/24  2224     Patient presents with: Back Pain   Peggy Armstrong is a 78 y.o. female.  {Add pertinent medical, surgical, social history, OB history to YEP:67052} Medical history includes duodenal adenocarcinoma, arthritis, anemia, atrial flutter, CKD, gout, HTN, CHF, AAA, HLD, dementia, anxiety.  She was admitted a month ago for severe anemia secondary to GI bleed.  She underwent EGD which showed source of bleeding as malignant duodenal mass with slightly oozing.   Patient here with worsening back pain.  Does have known compression fracture of L1.  Pain is constant despite p.o. Dilaudid  at home.  Denies any new fall or injury.  Recently discharged from nursing facility.  Pain is in the middle of her back with intermittent radiation down her left leg.  No weakness, numbness or tingling.  No fever.  No bowel or bladder incontinence.  No chest pain or shortness of breath.  No pain with urination or blood in the urine.  The history is provided by the patient.  Back Pain Associated symptoms: no abdominal pain, no chest pain, no dysuria, no headaches and no weakness        Prior to Admission medications   Medication Sig Start Date End Date Taking? Authorizing Provider  acetaminophen  (TYLENOL ) 500 MG tablet Take 2 tablets (1,000 mg total) by mouth every 8 (eight) hours. 02/23/24   Verdene Purchase, MD  albuterol  (VENTOLIN  HFA) 108 (90 Base) MCG/ACT inhaler Inhale 1-2 puffs into the lungs every 6 (six) hours as needed for wheezing or shortness of breath.    [provider]  allopurinol  (ZYLOPRIM ) 300 MG tablet Take 1 tablet (300 mg total) by mouth daily. 07/18/23   Rothfuss, Jacob T, PA-C  amiodarone  (PACERONE ) 200 MG tablet Take 1 tablet (200 mg total) by mouth 2 (two) times daily. 02/23/24   Krishnan, Gokul, MD  Ascorbic Acid (VITAMIN C PO) Take 1 tablet by mouth in the  morning.    [provider]  atorvastatin  (LIPITOR) 20 MG tablet Take 1 tablet (20 mg total) by mouth daily. 12/19/22   Vannie Reche RAMAN, NP  bisacodyl  (DULCOLAX) 10 MG suppository Place 1 suppository (10 mg total) rectally daily as needed for moderate constipation. 02/23/24   Verdene Purchase, MD  busPIRone  (BUSPAR ) 10 MG tablet TAKE 1 TABLET BY MOUTH TWICE A DAY 08/22/23   Rothfuss, Jacob T, PA-C  Calcium  Carb-Cholecalciferol  (CALCIUM  600 + D PO) Take 1 tablet by mouth in the morning.    [provider]  Cholecalciferol  (VITAMIN D3 PO) Take 1 capsule by mouth in the morning.    [provider]  cyanocobalamin  (VITAMIN B12) 1000 MCG tablet Take 1,000 mcg by mouth daily. Patient taking differently: Take 500-1,000 mcg by mouth daily.    [provider]  diltiazem  (CARDIZEM  CD) 300 MG 24 hr capsule Take 1 capsule (300 mg total) by mouth daily. 02/23/24   Krishnan, Gokul, MD  DULoxetine  (CYMBALTA ) 20 MG capsule TAKE 1 CAPSULE BY MOUTH EVERY DAY 10/23/23   Rothfuss, Jacob T, PA-C  ferrous sulfate  325 (65 FE) MG tablet Take 325 mg by mouth daily with breakfast.    [provider]  folic acid  (FOLVITE ) 1 MG tablet Take 1 tablet (1 mg total) by mouth daily. Patient not taking: Reported on 02/10/2024 05/18/23   Verdene Purchase, MD  HYDROmorphone  (DILAUDID ) 2 MG tablet Take 1 tablet (2 mg total)  by mouth every 4 (four) hours as needed for severe pain (pain score 7-10). 02/23/24   Verdene Purchase, MD  HYDROmorphone  (DILAUDID ) 2 MG tablet Take 1-2 tablets (2-4 mg total) by mouth every 4 (four) hours as needed for severe pain (pain score 7-10). 03/12/24   Doretha Folks, MD  ipratropium-albuterol  (DUONEB) 0.5-2.5 (3) MG/3ML SOLN Take 3 mLs by nebulization every 6 (six) hours as needed (for shortness of breath or wheezing).    [provider]  KLOR-CON  M20 20 MEQ tablet Take 20 mEq by mouth in the morning and at bedtime.    [provider]  levothyroxine   (SYNTHROID ) 100 MCG tablet Take 1 tablet (100 mcg total) by mouth daily before breakfast. 07/18/23   Rothfuss, Jacob T, PA-C  lidocaine  (LIDODERM ) 5 % Place 1 patch onto the skin daily. Remove & Discard patch within 12 hours or as directed by MD 02/24/24   Krishnan, Gokul, MD  melatonin 3 MG TABS tablet Take 1 tablet (3 mg total) by mouth at bedtime as needed (insomnia). Patient taking differently: Take 3 mg by mouth at bedtime. 05/18/23   Krishnan, Gokul, MD  methocarbamol  (ROBAXIN ) 500 MG tablet TAKE 1 TABLET BY MOUTH EVERY 6 HOURS AS NEEDED FOR MUSCLE SPASMS. 10/03/23   Rothfuss, Jacob T, PA-C  ondansetron  (ZOFRAN ) 4 MG tablet Take 4 mg by mouth every 6 (six) hours as needed for nausea or vomiting.    [provider]  pantoprazole  (PROTONIX ) 40 MG tablet Take 1 tablet (40 mg total) by mouth 2 (two) times daily. 02/23/24   Krishnan, Gokul, MD  polyethylene glycol (MIRALAX  / GLYCOLAX ) 17 g packet Take 17 g by mouth daily. 02/24/24   Krishnan, Gokul, MD  senna-docusate (SENOKOT-S) 8.6-50 MG tablet Take 2 tablets by mouth 2 (two) times daily. 02/23/24   Krishnan, Gokul, MD  sertraline  (ZOLOFT ) 25 MG tablet Take 25 mg by mouth daily.    [provider]  spironolactone  (ALDACTONE ) 25 MG tablet Take 0.5 tablets (12.5 mg total) by mouth daily. 02/24/24   Krishnan, Gokul, MD  sucralfate  (CARAFATE ) 1 g tablet Take 1 tablet (1 g total) by mouth 2 (two) times daily. 02/23/24   Krishnan, Gokul, MD    Allergies: Codeine, Tape, and Oxycodone     Review of Systems  Constitutional:  Negative for activity change and appetite change.  HENT:  Negative for congestion and rhinorrhea.   Respiratory:  Negative for cough, chest tightness and shortness of breath.   Cardiovascular:  Negative for chest pain.  Gastrointestinal:  Negative for abdominal pain, nausea and vomiting.  Genitourinary:  Negative for dysuria and hematuria.  Musculoskeletal:  Positive for back pain.  Skin:  Negative for rash.   Neurological:  Negative for dizziness, weakness and headaches.   all other systems are negative except as noted in the HPI and PMH.    Updated Vital Signs BP (!) 153/61   Pulse 62   Temp 98.3 F (36.8 C) (Oral)   Resp 17   Ht 5' (1.524 m)   Wt 75.8 kg   SpO2 99%   BMI 32.61 kg/m   Physical Exam Vitals and nursing Armstrong reviewed.  Constitutional:      General: She is not in acute distress.    Appearance: She is well-developed.  HENT:     Head: Normocephalic and atraumatic.     Mouth/Throat:     Pharynx: No oropharyngeal exudate.  Eyes:     Conjunctiva/sclera: Conjunctivae normal.     Pupils: Pupils are  equal, round, and reactive to light.  Neck:     Comments: No meningismus. Cardiovascular:     Rate and Rhythm: Normal rate and regular rhythm.     Heart sounds: Normal heart sounds. No murmur heard. Pulmonary:     Effort: Pulmonary effort is normal. No respiratory distress.     Breath sounds: Normal breath sounds.  Abdominal:     Palpations: Abdomen is soft.     Tenderness: There is no abdominal tenderness. There is no guarding or rebound.  Musculoskeletal:        General: Tenderness present. Normal range of motion.     Cervical back: Normal range of motion and neck supple.     Comments: Midline lumbar tenderness. 5/5 strength bilateral lower extremities.  Intact DP and PT pulses.  Able to lift legs off the bed bilaterally.  Skin:    General: Skin is warm.  Neurological:     Mental Status: She is alert and oriented to person, place, and time.     Cranial Nerves: No cranial nerve deficit.     Motor: No abnormal muscle tone.     Coordination: Coordination normal.     Comments:  5/5 strength throughout. CN 2-12 intact.Equal grip strength.   Psychiatric:        Behavior: Behavior normal.     (all labs ordered are listed, but only abnormal results are displayed) Labs Reviewed  CBC WITH DIFFERENTIAL/PLATELET  COMPREHENSIVE METABOLIC PANEL WITH GFR  LIPASE, BLOOD   URINALYSIS, ROUTINE W REFLEX MICROSCOPIC    EKG: None  Radiology: No results found.  {Document cardiac monitor, telemetry assessment procedure when appropriate:32947} Procedures   Medications Ordered in the ED  HYDROmorphone  (DILAUDID ) injection 1 mg (has no administration in time range)      {Click here for ABCD2, HEART and other calculators REFRESH Armstrong before signing:1}                              Medical Decision Making Amount and/or Complexity of Data Reviewed Labs: ordered. Decision-making details documented in ED Course. Radiology: ordered and independent interpretation performed. Decision-making details documented in ED Course. ECG/medicine tests: ordered and independent interpretation performed. Decision-making details documented in ED Course.  Risk Prescription drug management.  Acute on chronic lower back pain.  She appears well on arrival.  Stable vital signs.  Does have known L1 compression fracture.  No new weakness, numbness or tingling.  No bowel or bladder incontinence.  No fever or vomiting.  Low concern for cord compression or cauda equina.   {Document critical care time when appropriate  Document review of labs and clinical decision tools ie CHADS2VASC2, etc  Document your independent review of radiology images and any outside records  Document your discussion with family members, caretakers and with consultants  Document social determinants of health affecting pt's care  Document your decision making why or why not admission, treatments were needed:32947:::1}   Final diagnoses:  None    ED Discharge Orders     None

## 2024-04-01 ENCOUNTER — Emergency Department (HOSPITAL_COMMUNITY)

## 2024-04-01 ENCOUNTER — Inpatient Hospital Stay (HOSPITAL_COMMUNITY)

## 2024-04-01 DIAGNOSIS — Z79890 Hormone replacement therapy (postmenopausal): Secondary | ICD-10-CM | POA: Diagnosis not present

## 2024-04-01 DIAGNOSIS — N1832 Chronic kidney disease, stage 3b: Secondary | ICD-10-CM | POA: Diagnosis present

## 2024-04-01 DIAGNOSIS — I130 Hypertensive heart and chronic kidney disease with heart failure and stage 1 through stage 4 chronic kidney disease, or unspecified chronic kidney disease: Secondary | ICD-10-CM | POA: Diagnosis present

## 2024-04-01 DIAGNOSIS — M549 Dorsalgia, unspecified: Principal | ICD-10-CM | POA: Diagnosis present

## 2024-04-01 DIAGNOSIS — I480 Paroxysmal atrial fibrillation: Secondary | ICD-10-CM | POA: Diagnosis present

## 2024-04-01 DIAGNOSIS — Z86718 Personal history of other venous thrombosis and embolism: Secondary | ICD-10-CM | POA: Diagnosis not present

## 2024-04-01 DIAGNOSIS — M4856XA Collapsed vertebra, not elsewhere classified, lumbar region, initial encounter for fracture: Secondary | ICD-10-CM | POA: Diagnosis present

## 2024-04-01 DIAGNOSIS — C170 Malignant neoplasm of duodenum: Secondary | ICD-10-CM | POA: Diagnosis present

## 2024-04-01 DIAGNOSIS — F0284 Dementia in other diseases classified elsewhere, unspecified severity, with anxiety: Secondary | ICD-10-CM | POA: Diagnosis present

## 2024-04-01 DIAGNOSIS — F0283 Dementia in other diseases classified elsewhere, unspecified severity, with mood disturbance: Secondary | ICD-10-CM | POA: Diagnosis present

## 2024-04-01 DIAGNOSIS — G8929 Other chronic pain: Secondary | ICD-10-CM | POA: Diagnosis present

## 2024-04-01 DIAGNOSIS — E785 Hyperlipidemia, unspecified: Secondary | ICD-10-CM | POA: Diagnosis present

## 2024-04-01 DIAGNOSIS — F32A Depression, unspecified: Secondary | ICD-10-CM | POA: Diagnosis present

## 2024-04-01 DIAGNOSIS — E039 Hypothyroidism, unspecified: Secondary | ICD-10-CM | POA: Diagnosis present

## 2024-04-01 DIAGNOSIS — S32010A Wedge compression fracture of first lumbar vertebra, initial encounter for closed fracture: Secondary | ICD-10-CM | POA: Diagnosis not present

## 2024-04-01 DIAGNOSIS — M48061 Spinal stenosis, lumbar region without neurogenic claudication: Secondary | ICD-10-CM | POA: Diagnosis present

## 2024-04-01 DIAGNOSIS — G3109 Other frontotemporal dementia: Secondary | ICD-10-CM | POA: Diagnosis present

## 2024-04-01 DIAGNOSIS — I484 Atypical atrial flutter: Secondary | ICD-10-CM | POA: Diagnosis present

## 2024-04-01 DIAGNOSIS — Z8249 Family history of ischemic heart disease and other diseases of the circulatory system: Secondary | ICD-10-CM | POA: Diagnosis not present

## 2024-04-01 DIAGNOSIS — M4854XA Collapsed vertebra, not elsewhere classified, thoracic region, initial encounter for fracture: Secondary | ICD-10-CM | POA: Diagnosis not present

## 2024-04-01 DIAGNOSIS — S32021A Stable burst fracture of second lumbar vertebra, initial encounter for closed fracture: Secondary | ICD-10-CM | POA: Diagnosis not present

## 2024-04-01 DIAGNOSIS — M069 Rheumatoid arthritis, unspecified: Secondary | ICD-10-CM | POA: Diagnosis present

## 2024-04-01 DIAGNOSIS — R109 Unspecified abdominal pain: Secondary | ICD-10-CM | POA: Diagnosis not present

## 2024-04-01 DIAGNOSIS — Z7901 Long term (current) use of anticoagulants: Secondary | ICD-10-CM | POA: Diagnosis not present

## 2024-04-01 DIAGNOSIS — D631 Anemia in chronic kidney disease: Secondary | ICD-10-CM | POA: Diagnosis present

## 2024-04-01 DIAGNOSIS — R269 Unspecified abnormalities of gait and mobility: Secondary | ICD-10-CM | POA: Diagnosis present

## 2024-04-01 DIAGNOSIS — R531 Weakness: Secondary | ICD-10-CM | POA: Diagnosis not present

## 2024-04-01 DIAGNOSIS — Z87891 Personal history of nicotine dependence: Secondary | ICD-10-CM | POA: Diagnosis not present

## 2024-04-01 DIAGNOSIS — Z7401 Bed confinement status: Secondary | ICD-10-CM | POA: Diagnosis not present

## 2024-04-01 DIAGNOSIS — M4826 Kissing spine, lumbar region: Secondary | ICD-10-CM | POA: Diagnosis present

## 2024-04-01 DIAGNOSIS — I5032 Chronic diastolic (congestive) heart failure: Secondary | ICD-10-CM | POA: Diagnosis present

## 2024-04-01 DIAGNOSIS — Z6832 Body mass index (BMI) 32.0-32.9, adult: Secondary | ICD-10-CM | POA: Diagnosis not present

## 2024-04-01 LAB — URINALYSIS, ROUTINE W REFLEX MICROSCOPIC
Bilirubin Urine: NEGATIVE
Glucose, UA: NEGATIVE mg/dL
Hgb urine dipstick: NEGATIVE
Ketones, ur: NEGATIVE mg/dL
Nitrite: POSITIVE — AB
Protein, ur: 30 mg/dL — AB
Specific Gravity, Urine: >1.046 — ABNORMAL HIGH (ref 1.005–1.030)
pH: 5 (ref 5.0–8.0)

## 2024-04-01 LAB — CBC WITH DIFFERENTIAL/PLATELET
Abs Immature Granulocytes: 0.03 K/uL (ref 0.00–0.07)
Basophils Absolute: 0 K/uL (ref 0.0–0.1)
Basophils Relative: 1 %
Eosinophils Absolute: 0.1 K/uL (ref 0.0–0.5)
Eosinophils Relative: 2 %
HCT: 34.3 % — ABNORMAL LOW (ref 36.0–46.0)
Hemoglobin: 10.1 g/dL — ABNORMAL LOW (ref 12.0–15.0)
Immature Granulocytes: 1 %
Lymphocytes Relative: 13 %
Lymphs Abs: 0.5 K/uL — ABNORMAL LOW (ref 0.7–4.0)
MCH: 26.2 pg (ref 26.0–34.0)
MCHC: 29.4 g/dL — ABNORMAL LOW (ref 30.0–36.0)
MCV: 88.9 fL (ref 80.0–100.0)
Monocytes Absolute: 0.4 K/uL (ref 0.1–1.0)
Monocytes Relative: 10 %
Neutro Abs: 2.8 K/uL (ref 1.7–7.7)
Neutrophils Relative %: 73 %
Platelets: 118 K/uL — ABNORMAL LOW (ref 150–400)
RBC: 3.86 MIL/uL — ABNORMAL LOW (ref 3.87–5.11)
RDW: 16.6 % — ABNORMAL HIGH (ref 11.5–15.5)
WBC: 3.8 K/uL — ABNORMAL LOW (ref 4.0–10.5)
nRBC: 0 % (ref 0.0–0.2)

## 2024-04-01 LAB — COMPREHENSIVE METABOLIC PANEL WITH GFR
ALT: 33 U/L (ref 0–44)
AST: 34 U/L (ref 15–41)
Albumin: 3.5 g/dL (ref 3.5–5.0)
Alkaline Phosphatase: 179 U/L — ABNORMAL HIGH (ref 38–126)
Anion gap: 9 (ref 5–15)
BUN: 26 mg/dL — ABNORMAL HIGH (ref 8–23)
CO2: 21 mmol/L — ABNORMAL LOW (ref 22–32)
Calcium: 9 mg/dL (ref 8.9–10.3)
Chloride: 108 mmol/L (ref 98–111)
Creatinine, Ser: 1.07 mg/dL — ABNORMAL HIGH (ref 0.44–1.00)
GFR, Estimated: 53 mL/min — ABNORMAL LOW (ref >60)
Glucose, Bld: 98 mg/dL (ref 70–99)
Potassium: 4.6 mmol/L (ref 3.5–5.1)
Sodium: 138 mmol/L (ref 135–145)
Total Bilirubin: 0.4 mg/dL (ref 0.0–1.2)
Total Protein: 6.1 g/dL — ABNORMAL LOW (ref 6.5–8.1)

## 2024-04-01 LAB — LIPASE, BLOOD: Lipase: 40 U/L (ref 11–51)

## 2024-04-01 LAB — CBC
HCT: 32 % — ABNORMAL LOW (ref 36.0–46.0)
Hemoglobin: 9.4 g/dL — ABNORMAL LOW (ref 12.0–15.0)
MCH: 26.4 pg (ref 26.0–34.0)
MCHC: 29.4 g/dL — ABNORMAL LOW (ref 30.0–36.0)
MCV: 89.9 fL (ref 80.0–100.0)
Platelets: 116 K/uL — ABNORMAL LOW (ref 150–400)
RBC: 3.56 MIL/uL — ABNORMAL LOW (ref 3.87–5.11)
RDW: 16.7 % — ABNORMAL HIGH (ref 11.5–15.5)
WBC: 3.4 K/uL — ABNORMAL LOW (ref 4.0–10.5)
nRBC: 0 % (ref 0.0–0.2)

## 2024-04-01 LAB — CREATININE, SERUM
Creatinine, Ser: 1.13 mg/dL — ABNORMAL HIGH (ref 0.44–1.00)
GFR, Estimated: 50 mL/min — ABNORMAL LOW (ref >60)

## 2024-04-01 MED ORDER — PREDNISONE 10 MG (21) PO TBPK
20.0000 mg | ORAL_TABLET | Freq: Every evening | ORAL | Status: AC
  Administered 2024-04-01: 20 mg via ORAL

## 2024-04-01 MED ORDER — ENSURE PLUS HIGH PROTEIN PO LIQD
237.0000 mL | Freq: Two times a day (BID) | ORAL | Status: DC
  Administered 2024-04-02 – 2024-04-04 (×6): 237 mL via ORAL

## 2024-04-01 MED ORDER — ACETAMINOPHEN 325 MG PO TABS
650.0000 mg | ORAL_TABLET | Freq: Four times a day (QID) | ORAL | Status: DC | PRN
  Administered 2024-04-03: 650 mg via ORAL
  Filled 2024-04-01: qty 2

## 2024-04-01 MED ORDER — BUSPIRONE HCL 10 MG PO TABS
10.0000 mg | ORAL_TABLET | Freq: Two times a day (BID) | ORAL | Status: DC
  Administered 2024-04-01 – 2024-04-04 (×7): 10 mg via ORAL
  Filled 2024-04-01 (×7): qty 1

## 2024-04-01 MED ORDER — PREDNISONE 10 MG (21) PO TBPK
20.0000 mg | ORAL_TABLET | Freq: Every evening | ORAL | Status: AC
  Administered 2024-04-02: 20 mg via ORAL

## 2024-04-01 MED ORDER — IOHEXOL 300 MG/ML  SOLN
100.0000 mL | Freq: Once | INTRAMUSCULAR | Status: AC | PRN
  Administered 2024-04-01: 100 mL via INTRAVENOUS

## 2024-04-01 MED ORDER — IPRATROPIUM-ALBUTEROL 0.5-2.5 (3) MG/3ML IN SOLN: 3.0000 mL | Freq: Four times a day (QID) | RESPIRATORY_TRACT | Status: DC | PRN

## 2024-04-01 MED ORDER — ALLOPURINOL 300 MG PO TABS
300.0000 mg | ORAL_TABLET | Freq: Every day | ORAL | Status: DC
  Administered 2024-04-01 – 2024-04-04 (×4): 300 mg via ORAL
  Filled 2024-04-01 (×4): qty 1

## 2024-04-01 MED ORDER — MELATONIN 5 MG PO TABS
5.0000 mg | ORAL_TABLET | Freq: Every evening | ORAL | Status: DC | PRN
  Administered 2024-04-03: 5 mg via ORAL
  Filled 2024-04-01: qty 1

## 2024-04-01 MED ORDER — LIDOCAINE 5 % EX PTCH
1.0000 | MEDICATED_PATCH | CUTANEOUS | Status: DC
  Administered 2024-04-01 – 2024-04-04 (×4): 1 via TRANSDERMAL
  Filled 2024-04-01 (×3): qty 1

## 2024-04-01 MED ORDER — HYDROMORPHONE HCL 2 MG PO TABS
2.0000 mg | ORAL_TABLET | ORAL | Status: DC | PRN
  Administered 2024-04-02 – 2024-04-04 (×4): 2 mg via ORAL
  Filled 2024-04-01 (×5): qty 1

## 2024-04-01 MED ORDER — SUCRALFATE 1 G PO TABS
1.0000 g | ORAL_TABLET | Freq: Two times a day (BID) | ORAL | Status: DC
  Administered 2024-04-01: 1 g via ORAL
  Filled 2024-04-01: qty 1

## 2024-04-01 MED ORDER — PREDNISONE 10 MG (21) PO TBPK
20.0000 mg | ORAL_TABLET | Freq: Every morning | ORAL | Status: AC
  Administered 2024-04-01: 20 mg via ORAL
  Filled 2024-04-01: qty 21

## 2024-04-01 MED ORDER — LEVOTHYROXINE SODIUM 100 MCG PO TABS
100.0000 ug | ORAL_TABLET | Freq: Every day | ORAL | Status: DC
  Administered 2024-04-01 – 2024-04-04 (×4): 100 ug via ORAL
  Filled 2024-04-01 (×4): qty 1

## 2024-04-01 MED ORDER — DILTIAZEM HCL ER COATED BEADS 300 MG PO CP24
300.0000 mg | ORAL_CAPSULE | Freq: Every day | ORAL | Status: DC
  Filled 2024-04-01: qty 1

## 2024-04-01 MED ORDER — FERROUS SULFATE 325 (65 FE) MG PO TABS
325.0000 mg | ORAL_TABLET | Freq: Every day | ORAL | Status: DC
  Administered 2024-04-02 – 2024-04-04 (×3): 325 mg via ORAL
  Filled 2024-04-01 (×3): qty 1

## 2024-04-01 MED ORDER — SUCRALFATE 1 G PO TABS
1.0000 g | ORAL_TABLET | Freq: Three times a day (TID) | ORAL | Status: DC
  Administered 2024-04-01 – 2024-04-04 (×12): 1 g via ORAL
  Filled 2024-04-01 (×12): qty 1

## 2024-04-01 MED ORDER — FOLIC ACID 1 MG PO TABS
1.0000 mg | ORAL_TABLET | Freq: Every day | ORAL | Status: DC
  Administered 2024-04-01 – 2024-04-04 (×4): 1 mg via ORAL
  Filled 2024-04-01 (×4): qty 1

## 2024-04-01 MED ORDER — HYDROMORPHONE HCL 1 MG/ML IJ SOLN: 0.5000 mg | INTRAMUSCULAR | Status: DC | PRN

## 2024-04-01 MED ORDER — OXYCODONE HCL 5 MG PO TABS
5.0000 mg | ORAL_TABLET | Freq: Four times a day (QID) | ORAL | Status: DC | PRN
  Administered 2024-04-01: 5 mg via ORAL
  Filled 2024-04-01: qty 1

## 2024-04-01 MED ORDER — ATORVASTATIN CALCIUM 10 MG PO TABS
20.0000 mg | ORAL_TABLET | Freq: Every day | ORAL | Status: DC
  Administered 2024-04-01 – 2024-04-04 (×4): 20 mg via ORAL
  Filled 2024-04-01 (×4): qty 2

## 2024-04-01 MED ORDER — POLYETHYLENE GLYCOL 3350 17 G PO PACK
17.0000 g | PACK | Freq: Two times a day (BID) | ORAL | Status: DC
  Administered 2024-04-01 – 2024-04-04 (×6): 17 g via ORAL
  Filled 2024-04-01 (×7): qty 1

## 2024-04-01 MED ORDER — PREDNISONE 10 MG (21) PO TBPK
10.0000 mg | ORAL_TABLET | ORAL | Status: AC
  Administered 2024-04-01: 10 mg via ORAL

## 2024-04-01 MED ORDER — HYDROMORPHONE HCL 1 MG/ML IJ SOLN
1.0000 mg | Freq: Once | INTRAMUSCULAR | Status: AC
  Administered 2024-04-01: 1 mg via INTRAVENOUS
  Filled 2024-04-01: qty 1

## 2024-04-01 MED ORDER — SPIRONOLACTONE 12.5 MG HALF TABLET
12.5000 mg | ORAL_TABLET | Freq: Every day | ORAL | Status: DC
  Administered 2024-04-01 – 2024-04-04 (×4): 12.5 mg via ORAL
  Filled 2024-04-01 (×6): qty 1

## 2024-04-01 MED ORDER — PROCHLORPERAZINE EDISYLATE 10 MG/2ML IJ SOLN: 5.0000 mg | Freq: Four times a day (QID) | INTRAMUSCULAR | Status: DC | PRN

## 2024-04-01 MED ORDER — SERTRALINE HCL 25 MG PO TABS
25.0000 mg | ORAL_TABLET | Freq: Every day | ORAL | Status: DC
Start: 2024-04-01 — End: 2024-04-04
  Administered 2024-04-01 – 2024-04-04 (×4): 25 mg via ORAL
  Filled 2024-04-01 (×4): qty 1

## 2024-04-01 MED ORDER — PREDNISONE 10 MG (21) PO TBPK
10.0000 mg | ORAL_TABLET | Freq: Three times a day (TID) | ORAL | Status: AC
  Administered 2024-04-02 (×3): 10 mg via ORAL

## 2024-04-01 MED ORDER — LACTATED RINGERS IV SOLN: INTRAVENOUS | Status: DC

## 2024-04-01 MED ORDER — POLYETHYLENE GLYCOL 3350 17 G PO PACK: 17.0000 g | PACK | Freq: Every day | ORAL | Status: DC | PRN

## 2024-04-01 MED ORDER — PANTOPRAZOLE SODIUM 40 MG IV SOLR
40.0000 mg | Freq: Two times a day (BID) | INTRAVENOUS | Status: DC
  Administered 2024-04-01 – 2024-04-04 (×6): 40 mg via INTRAVENOUS
  Filled 2024-04-01 (×6): qty 10

## 2024-04-01 MED ORDER — DULOXETINE HCL 20 MG PO CPEP
20.0000 mg | ORAL_CAPSULE | Freq: Every day | ORAL | Status: DC
  Administered 2024-04-01 – 2024-04-04 (×4): 20 mg via ORAL
  Filled 2024-04-01 (×3): qty 1

## 2024-04-01 MED ORDER — HEPARIN SODIUM (PORCINE) 5000 UNIT/ML IJ SOLN
5000.0000 [IU] | Freq: Three times a day (TID) | INTRAMUSCULAR | Status: DC
  Administered 2024-04-01: 5000 [IU] via SUBCUTANEOUS
  Filled 2024-04-01: qty 1

## 2024-04-01 MED ORDER — PREDNISONE 10 MG (21) PO TBPK
10.0000 mg | ORAL_TABLET | Freq: Four times a day (QID) | ORAL | Status: DC
  Administered 2024-04-03 – 2024-04-04 (×6): 10 mg via ORAL

## 2024-04-01 MED ORDER — AMIODARONE HCL 200 MG PO TABS
200.0000 mg | ORAL_TABLET | Freq: Two times a day (BID) | ORAL | Status: DC
  Administered 2024-04-01 – 2024-04-04 (×7): 200 mg via ORAL
  Filled 2024-04-01 (×7): qty 1

## 2024-04-01 NOTE — Progress Notes (Signed)
 Orthopedic Tech Progress Note Patient Details:  DARNELLA ZEITER 1945/11/24 [id number] Applied TLSO per order.  Ortho Devices Type of Ortho Device: Thoracolumbar corset (TLSO) Ortho Device/Splint Interventions: Ordered, Application, Adjustment   Post Interventions Patient Tolerated: Well Instructions Provided: Adjustment of device, Care of device, Poper ambulation with device  Morna Pink 04/01/2024, 9:11 AM

## 2024-04-01 NOTE — Progress Notes (Signed)
 Orthopedic Tech Progress Note Patient Details:  Peggy Armstrong 1946-04-11 [id number]  Patient ID: [name], female   DOB: [birth year], 78 y.o.   MRN: [id number] I was called for TLSO. I clarified that they would be admitted. They said they would be admitted. I told them that the day shift ortho tech would take care of it. I sent a message to the [facility] ortho tech phone. Chandra Dorn PARAS 04/01/2024, 4:05 AM

## 2024-04-01 NOTE — ED Notes (Signed)
 Pt alert, NAD, calm, interactive, resps e/u, speaking in clear complete sentences. VSS. Sipping on soda. Denies questions or needs. Reports continued pain, improving. Denies other sx.

## 2024-04-01 NOTE — Progress Notes (Addendum)
 PROGRESS NOTE    Peggy Armstrong  FMW:[id number] DOB: [birth year] DOA: 03/31/2024 PCP: Iven [name] [name]   Brief Narrative: 78 year old with past medical history significant for duodenal adenocarcinoma, anemia of chronic disease, A-flutter on Eliquis , CKD 3B, gout, hypertension, heart failure preserved ejection fraction, AAA, hyperlipidemia, multiple spontaneous vertebral compression fracture presented to the ED complaining of worsening back pain, associated with difficulty standing and  ambulating   Assessment & Plan:   Principal Problem:   Intractable back pain  1-Back Pain acute on chronic  MRI lumbar spine: 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  Chronic compression L1 fracture, chronic L2 fracture with 6mm retropulsion unchanged. mild-to-moderate left lateral recess stenosis at L4-5  -Resume lidocaine  patch and home oral Dilaudid . - Continue IV Dilaudid . - Will discuss MRI with neurosurgery, will consider steroids Spoke with neurosurgery, they recommend steroid dose pack, out patient follow up.   2-A-flutter a flutter - Eliquis  was discontinued last hospitalization 8/31st due to GI bleed.  Patient was thought to be high risk for bleeding. - Resume amiodarone  - hold  Cardizem , HR in the 50  Duodenal adenocarcinoma - Follows with Dr. Ezzard in [place]. -Patient has been deemed not a surgical candidate -Patient is supposed to follow with her oncology for radiation treatment versus oral treatments  Chronic anxiety/depression -Resumed Cymbalta  and BuSpar , Zoloft .   Hypothyroidism: Continue Synthroid   Chronic diastolic heart failure: Resume spironolactone    Estimated body mass index is 32.61 kg/m as calculated from the following:   Height as of this encounter: 5' (1.524 m).   Weight as of this encounter: 75.8 kg.   DVT prophylaxis: SCD Code Status: Full  code Family Communication: care discussed with patient.  Disposition Plan:  Status is: Inpatient Remains inpatient appropriate because: pain management.     Consultants:  Neurosurgery   Procedures:  none Antimicrobials:    Subjective: She is complaining of back pain, shooting pain, radiates to right leg.     Objective: Vitals:   04/01/24 1130 04/01/24 1145 04/01/24 1200 04/01/24 1215  BP: (!) 121/47 (!) 104/57 122/70 (!) 141/58  Pulse: (!) 59 (!) 59 (!) 59 69  Resp:      Temp:      TempSrc:      SpO2: 94% 94% 98% 96%  Weight:      Height:       No intake or output data in the 24 hours ending 04/01/24 1245 Filed Weights   03/31/24 2244  Weight: 75.8 kg    Examination:  General exam: Appears calm and comfortable  Respiratory system: Clear to auscultation. Respiratory effort normal. Cardiovascular system: S1 & S2 heard, RRR. No JVD, murmurs, rubs, gallops or clicks. No pedal edema. Gastrointestinal system: Abdomen is nondistended, soft and nontender. No organomegaly or masses felt. Normal bowel sounds heard. Central nervous system: Alert and oriented. No focal neurological deficits. Extremities: able to move both lower extremities, raise leg BL passively. More difficulty moving right LE/    Data Reviewed: I have personally reviewed following labs and imaging studies  CBC: Recent Labs  Lab 04/01/24 0033 04/01/24 0433  WBC 3.8* 3.4*  NEUTROABS 2.8  --   HGB 10.1* 9.4*  HCT 34.3* 32.0*  MCV 88.9 89.9  PLT 118* 116*   Basic Metabolic Panel: Recent Labs  Lab 04/01/24 0033 04/01/24 0433  NA 138  --   K 4.6  --  CL 108  --   CO2 21*  --   GLUCOSE 98  --   BUN 26*  --   CREATININE 1.07* 1.13*  CALCIUM  9.0  --    GFR: Estimated Creatinine Clearance: 37.3 mL/min (A) (by C-G formula based on SCr of 1.13 mg/dL (H)). Liver Function Tests: Recent Labs  Lab 04/01/24 0033  AST 34  ALT 33  ALKPHOS 179*  BILITOT 0.4  PROT 6.1*  ALBUMIN 3.5    Recent Labs  Lab 04/01/24 0033  LIPASE 40   No results for input(s): AMMONIA in the last 168 hours. Coagulation Profile: No results for input(s): INR, PROTIME in the last 168 hours. Cardiac Enzymes: No results for input(s): CKTOTAL, CKMB, CKMBINDEX, TROPONINI in the last 168 hours. BNP (last 3 results) Recent Labs    08/24/23 1529  PROBNP 1,402*   HbA1C: No results for input(s): HGBA1C in the last 72 hours. CBG: No results for input(s): GLUCAP in the last 168 hours. Lipid Profile: No results for input(s): CHOL, HDL, LDLCALC, TRIG, CHOLHDL, LDLDIRECT in the last 72 hours. Thyroid  Function Tests: No results for input(s): TSH, T4TOTAL, FREET4, T3FREE, THYROIDAB in the last 72 hours. Anemia Panel: No results for input(s): VITAMINB12, FOLATE, FERRITIN, TIBC, IRON , RETICCTPCT in the last 72 hours. Sepsis Labs: No results for input(s): PROCALCITON, LATICACIDVEN in the last 168 hours.  No results found for this or any previous visit (from the past 240 hours).       Radiology Studies: MR LUMBAR SPINE WO CONTRAST Result Date: 04/01/2024 EXAM: MRI LUMBAR SPINE 04/01/2024 07:15:00 AM TECHNIQUE: Multiplanar multisequence MRI of the lumbar spine was performed without the administration of intravenous contrast. COMPARISON: CT of the lumbar spine dated 04/01/2024 and MRI of the lumbar spine dated 03/12/2024. CLINICAL HISTORY: Compression fracture, lumbar. Pt c/o low back pain, eval lumbar compression fx. FINDINGS: BONES AND ALIGNMENT: Dextroscoliosis of the thoracolumbar spine again demonstrated. Mild-to-moderate compression fracture of L1 again demonstrated, which continues to be heterogeneously hyperintense on T2. Chronic burst fracture of L2 also again demonstrated, which is also heterogeneously hyperintense on T2. There is retropulsion of the posterior superior corner of the vertebral body, by approximately 6 mm, similar to the  prior exam. Hemangiomas are again noted within the T11 and T12 vertebrae. At L4-L5, there is degenerative grade 1 anterolisthesis. Other visualized bone marrow signal is unremarkable. SPINAL CORD: The conus terminates normally. SOFT TISSUES: No paraspinal mass. T12-L1: No significant disc herniation. No spinal canal stenosis or neural foraminal narrowing. L1-L2: Moderate-to-severe central spinal canal stenosis and bilateral lateral recess stenosis. There is possible impingement of the L2 nerves in the lateral recesses bilaterally. L2-L3: Mild disc bulging and bilateral facet arthrosis with mild central spinal canal stenosis and bilateral lateral recess stenosis. L3-L4: Mild facet hypertrophic changes. No significant disc herniation. No spinal canal stenosis or neural foraminal narrowing. L4-L5: Degenerative grade 1 anterolisthesis with mild bilateral facet hypertrophy, resulting in mild central spinal canal stenosis, moderate right lateral recess stenosis, and mild-to-moderate left lateral recess stenosis. L5-S1: No significant disc herniation. No spinal canal stenosis or neural foraminal narrowing. IMPRESSION: 1. 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 2. Mild-to-moderate compression fracture of L1 and chronic burst fracture of L2 with 6 mm retropulsion, unchanged from prior. 3. Mild central spinal canal stenosis and bilateral lateral recess stenosis at L2-3. 4. Mild central spinal canal stenosis, moderate right lateral recess stenosis, and  mild-to-moderate left lateral recess stenosis at L4-5. Electronically signed by: Evalene Coho MD 04/01/2024 08:46 AM EDT RP Workstation: [id number]   CT ABDOMEN PELVIS W CONTRAST Result Date: 04/01/2024 CLINICAL DATA:  Abdominal pain and back pain. EXAM: CT ABDOMEN AND PELVIS WITH CONTRAST CT LUMBAR SPINE WITHOUT CONTRAST TECHNIQUE:  Multidetector CT imaging of the abdomen and pelvis was performed using the standard protocol following bolus administration of intravenous contrast. Multiplanar CT image reconstructions were created and reviewed. Multidetector CT imaging of the lumbar spine was performed using the standard protocol without the use of contrast. Multiplanar CT image reconstructions were created and reviewed. RADIATION DOSE REDUCTION: This exam was performed according to the departmental dose-optimization program which includes automated exposure control, adjustment of the mA and/or kV according to patient size and/or use of iterative reconstruction technique. CONTRAST:  OMNIPAQUE  IOHEXOL  300 MG/ML  SOLN COMPARISON:  CTA chest, abdomen and pelvis and lumbar spine CT both 03/12/2024. FINDINGS: CT ABDOMEN AND PELVIS WITH CONTRAST FINDINGS Lower chest: There is a small layering right pleural effusion, significantly improved. There is posterior atelectasis in the lung bases. No focal pneumonia. There is mild cardiomegaly. Coronary artery calcifications. No pericardial effusion. There is a moderate-sized hiatal hernia, small amount of fluid again in the hernia sac. Hepatobiliary: No focal liver abnormality is seen. Status post cholecystectomy. There is chronic extrahepatic biliary prominence with common bile duct again 10 mm, with pneumobilia again seen. Pancreas: No mass or ductal dilatation or inflammation. Partial glandular atrophy. Spleen: Borderline prominent. There are numerous scattered subcentimeter hypodensities which are too small to characterize and were not well seen on the last study but probably present at that time, with a definitive increase in the number of lesions compared with CTA abdomen and pelvis 07/20/2023. Findings are nonspecific. Possible etiologies include infectious and neoplastic involvement. Adrenals/Urinary Tract: There is no adrenal mass. There is a 2.7 cm Bosniak 1 cyst in the medial upper pole left  kidney, Hounsfield density is 9, and occasional few bilateral Bosniak 2 subcentimeter cortical cysts which are too small to characterize. There is no mass enhancement. There is no urinary stone or obstruction. Unremarkable bladder. Stomach/Bowel: Negative for dilatation or wall thickening. An appendix is not seen in this patient. Scattered sigmoid diverticulosis without diverticulitis. Vascular/Lymphatic: Aortic atherosclerosis. No enlarged abdominal or pelvic lymph nodes. Reproductive: Status post hysterectomy. No adnexal masses. Other: None. Musculoskeletal: Osteopenia, degenerative and posttraumatic changes of the spine. 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 T Here are healed fractures of the posteromedial right tenth through twelfth ribs. Mild hip DJD. There are healed fracture deformities of the left pubic rami. CT LUMBAR SPINE WITHOUT CONTRAST FINDINGS Segmentation: Standard. Alignment: Mild dextroscoliosis apex at L2. Mild grade 1 degenerative anterolisthesis L3-4. 6 mm chronic posterosuperior vertebral cortical retropulsion at L2 due to prior L2 burst fracture. No new or worsening alignment abnormality.  No further listhesis. Vertebrae: Diffuse osteopenia. 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 No focal pathologic lesion. As above there is partial hemangiomatous replacement of the T11 and T12 vertebrae with thoracic spine bridging enthesopathy 08/06/2010. No interval worsening is seen in the previously noted L1 upper plate anterior wedge compression fracture. Anterior height loss is 20%, central height loss is 25-30%, posterior height loss about 15%. There is mild posterosuperior cortical retropulsion eccentric to the left which has slightly increased, with increased encroachment on the left subarticular zone. AP thecal sac is 10 mm in the midline. L2 burst fracture with  6 mm posterosuperior cortical retropulsion is unchanged  with moderate spinal canal and subarticular zone effacement. This is unchanged as is a mild upper plate concave compression fracture deformity of L3. L4 and L5 remain normal in heights.  There is lumbar spondylosis. Paraspinal and other soft tissues: No acute finding. Disc levels: Just below T12-L1 there is increased effacement of the left ventral thecal sac due to slight increased posterosuperior cortical retropulsion at L1, with encroachment on the subarticular zone. The disc itself is normal in height without herniation. There are slight facet spurs. The foramina are moderately stenotic. Widened L1-2 disc space due to the prior L2 burst fracture. Posterosuperior L2 cortical retropulsion as described above with moderate encroachment on the spinal canal and subarticular zones. Moderate to severe left and mild right L1-2 foraminal stenosis is seen with slight facet spurring. The L2-3 and L3-4 discs are normal in height. There is mild foraminal stenosis and facet hypertrophy. There are mild disc bulge without herniation or canal stenosis. At L4-5, the disc is normal in height. Grade 1 degenerative anterolisthesis is noted with moderate facet hypertrophy, ligamentous thickening, disc bulge and moderate spinal canal and lateral recess stenosis. Relatively mild foraminal stenosis. At L5-S1 there is facet hypertrophy and inferior foraminal bulging of the disc with bilateral moderate to severe foraminal stenosis, but no disc herniation or canal zone narrowing. The SI joints are patent. No sacral insufficiency fracture seen. There is spurring of both SI joints. IMPRESSION: 1. No acute findings in the abdomen or pelvis. 2. Small right pleural effusion, significantly improved. 3. Moderate-sized hiatal hernia. 4. Cardiomegaly with coronary artery and aortic atherosclerosis. 5. Numerous subcentimeter hypodensities in the spleen, too small to characterize but definitively increased in number compared with CTA abdomen and pelvis  07/20/2023. Findings are nonspecific, with possible etiologies including infectious and neoplastic involvement. 6. Osteopenia and degenerative change. 7. L1 upper plate anterior wedge compression fracture with 20% anterior height loss, 25-30% central height loss and 15% posterior height loss. There is mild posterosuperior cortical retropulsion eccentric to the left which has slightly increased, with increased encroachment on the left ventral thecal sac and subarticular zone. The overall vertebral height loss appears unchanged. 8. L2 burst fracture with 6 mm posterosuperior cortical retropulsion, unchanged. 9. L3 mild upper plate concave compression fracture deformity, unchanged. 10. 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 11. L5-S1 moderate to severe bilateral foraminal stenosis. Other levels with lesser foraminal stenosis as above. 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 Aortic Atherosclerosis (ICD10-I70.0). Electronically Signed   By: Francis Quam M.D.   On: 04/01/2024 02:37   CT L-SPINE NO CHARGE Result Date: 04/01/2024 CLINICAL DATA:  Abdominal pain and back pain. EXAM: CT ABDOMEN AND PELVIS WITH CONTRAST CT LUMBAR SPINE WITHOUT CONTRAST TECHNIQUE: Multidetector CT imaging of the abdomen and pelvis was performed using the standard protocol following bolus administration of intravenous contrast. Multiplanar CT image reconstructions were created and reviewed. Multidetector CT imaging of the lumbar spine was performed using the standard protocol without the use of contrast. Multiplanar CT image reconstructions were created and reviewed. RADIATION DOSE REDUCTION: This exam was performed according to the departmental dose-optimization program which includes automated exposure control, adjustment of the mA and/or kV according to patient size and/or use of iterative reconstruction technique. CONTRAST:  OMNIPAQUE  IOHEXOL  300 MG/ML  SOLN COMPARISON:  CTA chest, abdomen and pelvis and lumbar spine CT both  03/12/2024. FINDINGS: CT ABDOMEN AND PELVIS WITH CONTRAST FINDINGS Lower chest: There is a small layering right pleural effusion, significantly  improved. There is posterior atelectasis in the lung bases. No focal pneumonia. There is mild cardiomegaly. Coronary artery calcifications. No pericardial effusion. There is a moderate-sized hiatal hernia, small amount of fluid again in the hernia sac. Hepatobiliary: No focal liver abnormality is seen. Status post cholecystectomy. There is chronic extrahepatic biliary prominence with common bile duct again 10 mm, with pneumobilia again seen. Pancreas: No mass or ductal dilatation or inflammation. Partial glandular atrophy. Spleen: Borderline prominent. There are numerous scattered subcentimeter hypodensities which are too small to characterize and were not well seen on the last study but probably present at that time, with a definitive increase in the number of lesions compared with CTA abdomen and pelvis 07/20/2023. Findings are nonspecific. Possible etiologies include infectious and neoplastic involvement. Adrenals/Urinary Tract: There is no adrenal mass. There is a 2.7 cm Bosniak 1 cyst in the medial upper pole left kidney, Hounsfield density is 9, and occasional few bilateral Bosniak 2 subcentimeter cortical cysts which are too small to characterize. There is no mass enhancement. There is no urinary stone or obstruction. Unremarkable bladder. Stomach/Bowel: Negative for dilatation or wall thickening. An appendix is not seen in this patient. Scattered sigmoid diverticulosis without diverticulitis. Vascular/Lymphatic: Aortic atherosclerosis. No enlarged abdominal or pelvic lymph nodes. Reproductive: Status post hysterectomy. No adnexal masses. Other: None. Musculoskeletal: Osteopenia, degenerative and posttraumatic changes of the spine. 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 T Here are healed fractures of the posteromedial right tenth through  twelfth ribs. Mild hip DJD. There are healed fracture deformities of the left pubic rami. CT LUMBAR SPINE WITHOUT CONTRAST FINDINGS Segmentation: Standard. Alignment: Mild dextroscoliosis apex at L2. Mild grade 1 degenerative anterolisthesis L3-4. 6 mm chronic posterosuperior vertebral cortical retropulsion at L2 due to prior L2 burst fracture. No new or worsening alignment abnormality.  No further listhesis. Vertebrae: Diffuse osteopenia. 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 No focal pathologic lesion. As above there is partial hemangiomatous replacement of the T11 and T12 vertebrae with thoracic spine bridging enthesopathy 08/06/2010. No interval worsening is seen in the previously noted L1 upper plate anterior wedge compression fracture. Anterior height loss is 20%, central height loss is 25-30%, posterior height loss about 15%. There is mild posterosuperior cortical retropulsion eccentric to the left which has slightly increased, with increased encroachment on the left subarticular zone. AP thecal sac is 10 mm in the midline. L2 burst fracture with 6 mm posterosuperior cortical retropulsion is unchanged with moderate spinal canal and subarticular zone effacement. This is unchanged as is a mild upper plate concave compression fracture deformity of L3. L4 and L5 remain normal in heights.  There is lumbar spondylosis. Paraspinal and other soft tissues: No acute finding. Disc levels: Just below T12-L1 there is increased effacement of the left ventral thecal sac due to slight increased posterosuperior cortical retropulsion at L1, with encroachment on the subarticular zone. The disc itself is normal in height without herniation. There are slight facet spurs. The foramina are moderately stenotic. Widened L1-2 disc space due to the prior L2 burst fracture. Posterosuperior L2 cortical retropulsion as described above with moderate encroachment on the spinal canal  and subarticular zones. Moderate to severe left and mild right L1-2 foraminal stenosis is seen with slight facet spurring. The L2-3 and L3-4 discs are normal in height. There is mild foraminal stenosis and facet hypertrophy. There are mild disc bulge without herniation or canal stenosis. At L4-5, the disc is normal in height. Grade 1  degenerative anterolisthesis is noted with moderate facet hypertrophy, ligamentous thickening, disc bulge and moderate spinal canal and lateral recess stenosis. Relatively mild foraminal stenosis. At L5-S1 there is facet hypertrophy and inferior foraminal bulging of the disc with bilateral moderate to severe foraminal stenosis, but no disc herniation or canal zone narrowing. The SI joints are patent. No sacral insufficiency fracture seen. There is spurring of both SI joints. IMPRESSION: 1. No acute findings in the abdomen or pelvis. 2. Small right pleural effusion, significantly improved. 3. Moderate-sized hiatal hernia. 4. Cardiomegaly with coronary artery and aortic atherosclerosis. 5. Numerous subcentimeter hypodensities in the spleen, too small to characterize but definitively increased in number compared with CTA abdomen and pelvis 07/20/2023. Findings are nonspecific, with possible etiologies including infectious and neoplastic involvement. 6. Osteopenia and degenerative change. 7. L1 upper plate anterior wedge compression fracture with 20% anterior height loss, 25-30% central height loss and 15% posterior height loss. There is mild posterosuperior cortical retropulsion eccentric to the left which has slightly increased, with increased encroachment on the left ventral thecal sac and subarticular zone. The overall vertebral height loss appears unchanged. 8. L2 burst fracture with 6 mm posterosuperior cortical retropulsion, unchanged. 9. L3 mild upper plate concave compression fracture deformity, unchanged. 10. 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 11. L5-S1  moderate to severe bilateral foraminal stenosis. Other levels with lesser foraminal stenosis as above. 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 Aortic Atherosclerosis (ICD10-I70.0). Electronically Signed   By: Francis Quam M.D.   On: 04/01/2024 02:37        Scheduled Meds:  heparin  5,000 Units Subcutaneous Q8H   levothyroxine   100 mcg Oral QAC breakfast   sertraline   25 mg Oral Daily   Continuous Infusions:  lactated ringers  50 mL/hr at 04/01/24 0443     LOS: 0 days    Time spent: 35 minutes    Sana Tessmer A Sakura Denis, MD [facility]   If 7PM-7AM, please contact night-coverage [email or web address]  04/01/2024, 12:45 PM

## 2024-04-01 NOTE — H&P (Signed)
 History and Physical  NASIAH Armstrong FMW:[id number] DOB: [birth year] DOA: 03/31/2024  Referring physician: Dr. Carita, EDP  PCP: Iven [name] DASEN, PA-C  Outpatient Specialists: Oncology, GI, cardiology. Patient coming from: Home.  Chief Complaint: Back pain.  HPI: Peggy Armstrong is a 78 y.o. female with medical history significant for duodenal adenocarcinoma, anemia of chronic disease, atrial flutter on Eliquis , CKD 3B, gout, hypertension, HFpEF, AAA, hyperlipidemia, anxiety, multiple spontaneous vertebral compression fractures who presents to the ER due to back pain.  Has had difficulty standing or ambulating.  In the ER, CT abdomen pelvis with contrast revealed no acute findings in the abdomen or pelvis.  Small right pleural effusion, significantly improved.  Moderate size hiatal hernia.  Sepsis centimeter hypodensity in the spleen too small to characterize but definitely increased in number compared with CTA abdomen and pelvis 07/17/2023.  Findings are nonspecific but possible etiologies including infectious and neoplastic involvement.  Osteopenia and degenerative change.  8 1 upper plate anterior wedge compression fracture with 20% anterior height loss, 25 to 30% central height loss and 15% posterior height loss.  There is mild posterior superior cortical retropulsion eccentric to the left which has slightly increased with increased encroachment on the left ventral thecal sac and Sub articular segment.  There are provide vertebral height loss appears unchanged.  L2 burst fracture with 6 mm posterior superior cortical retropulsion, unchanged.  L3 mid mild upper plate concave compression fracture deformity unchanged.  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  L5-S1 moderate to severe bilateral foraminal stenosis.  Baastrup's disease.  The patient received multiple rounds of IV opiate-based analgesics and still with a lot of pain.  Patient admitted for pain control, MRI of the  spine and neurosurgical evaluation.  ED Course: Temperature 98.1.  122/69, pulse 63, respiration rate 17, O2 saturation 96% on room air.  Review of Systems: Review of systems as noted in the HPI. All other systems reviewed and are negative.   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   Cellulitis of left arm 08/14/2023   Cholangitis (HCC)    Choledocholithiasis 03/22/2017   Chronic anticoagulation 02/12/2024   Chronic diastolic CHF (congestive heart failure) (HCC) 11/08/2019   Chronic low back pain 07/18/2023   CKD (chronic kidney disease) stage 3, GFR 30-59 ml/min (HCC) 06/09/2017   CKD (chronic kidney disease), stage III (HCC) 06/09/2017   Closed compression fracture of body of L1 vertebra (HCC) 02/10/2024   Closed compression fracture of L2 lumbar vertebra, initial encounter (HCC) 05/11/2023   Clotting disorder    right DVT     Congestive heart failure (HCC) 05/18/2023   Constipation 02/21/2024   Dehydration 05/12/2023   Depressive disorder 05/18/2023   Difficulty walking 05/19/2023   Disorder of bone 10/25/2023   Displacement of bone 10/25/2023   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   Duodenal mass    Duodenal ulcer    DVT (deep venous thrombosis) (HCC)  right DVT   Dysphagia 05/19/2023   Epigastric pain    Essential hypertension    Fall at home, initial encounter 10/21/2023   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   GAD (generalized anxiety disorder)     Gastric polyps 05/16/2023   Generalized weakness 05/12/2023   GI bleed 07/20/2023   Goals of care, counseling/discussion 02/20/2024   Gout    Gout    Hematemesis 11/05/2017   Hemorrhagic shock (HCC) 07/21/2023   History of depression 10/21/2023   History of DVT (deep vein thrombosis) 11/08/2019   History of GI bleed 10/21/2023   Hyperlipidemia 05/12/2023   Hypertension    Hypokalemia 07/03/2017   Hypothyroidism 05/12/2023   IDA (iron  deficiency anemia) 02/15/2024   Iron  deficiency anemia 08/02/2016   Left elbow avulsion fracture 10/21/2023   Left humeral fracture 10/21/2023   Long term current use of anticoagulant therapy 06/20/2023   Lumbar radiculopathy 12/15/2023   Macrocytic anemia 11/05/2017   Medication management 02/21/2024   Melena 11/05/2017   Morbid obesity (HCC) 11/08/2019   Mucosal abnormality of esophagus 02/14/2024   Muscle weakness 05/19/2023   Nausea & vomiting    Need for assistance with personal care 05/21/2023   Need for immunization against influenza 07/18/2019   Need for vaccination 04/23/2018   Obesity with body mass index 30 or greater 10/25/2023   Occult blood in stools 05/16/2023   Pain 02/21/2024   Palliative care encounter 02/20/2024   Paroxysmal atrial fibrillation (HCC) 05/12/2023   Peri-ampullary neoplasm    Poor dentition 08/14/2016   RA (rheumatoid arthritis) (HCC)    Reduced mobility 06/20/2023   Restless leg 12/02/2022   Right shoulder pain 07/26/2018   Routine general medical examination at a health care facility 12/13/2019   S/P ERCP 05/05/2017   Severe anemia 02/10/2024   SIRS (systemic inflammatory response syndrome) (HCC) 03/22/2017   Symbolic dysfunction 05/19/2023   Unsteadiness on feet 05/21/2023   Upper GI bleed 06/20/2023   Past Surgical History:  Procedure Laterality Date   ABDOMINAL HYSTERECTOMY     BIOPSY  05/16/2023   Procedure: BIOPSY;  Surgeon: Aneita Gwendlyn DASEN, MD;  Location: [facility];  Service:  Gastroenterology;;   CHOLECYSTECTOMY     ENDOSCOPIC RETROGRADE CHOLANGIOPANCREATOGRAPHY (ERCP) WITH PROPOFOL  N/A 06/15/2017   Procedure: ENDOSCOPIC RETROGRADE CHOLANGIOPANCREATOGRAPHY (ERCP) WITH PROPOFOL ;  Surgeon: Teressa Toribio SQUIBB, MD;  Location: [facility];  Service: Endoscopy;  Laterality: N/A;   ESOPHAGOGASTRODUODENOSCOPY N/A 02/14/2024   Procedure: EGD (ESOPHAGOGASTRODUODENOSCOPY);  Surgeon: Wilhelmenia Aloha Raddle., MD;  Location: [facility];  Service: Gastroenterology;  Laterality: N/A;   ESOPHAGOGASTRODUODENOSCOPY (EGD) WITH PROPOFOL  N/A 03/24/2017   Procedure: ESOPHAGOGASTRODUODENOSCOPY (EGD) WITH PROPOFOL ;  Surgeon: Legrand Victory LITTIE DOUGLAS, MD;  Location: [facility];  Service: Gastroenterology;  Laterality: N/A;   ESOPHAGOGASTRODUODENOSCOPY (EGD) WITH PROPOFOL  N/A 11/07/2017   Procedure: ESOPHAGOGASTRODUODENOSCOPY (EGD) WITH PROPOFOL ;  Surgeon: Teressa Toribio SQUIBB, MD;  Location: [facility];  Service: Endoscopy;  Laterality: N/A;   ESOPHAGOGASTRODUODENOSCOPY (EGD) WITH PROPOFOL  N/A 05/16/2023   Procedure: ESOPHAGOGASTRODUODENOSCOPY (EGD) WITH PROPOFOL ;  Surgeon: Aneita Gwendlyn DASEN, MD;  Location: [facility];  Service: Gastroenterology;  Laterality: N/A;   ESOPHAGOGASTRODUODENOSCOPY (EGD) WITH PROPOFOL  N/A 07/21/2023   Procedure: ESOPHAGOGASTRODUODENOSCOPY (EGD) WITH PROPOFOL ;  Surgeon: Charlanne Groom, MD;  Location[facility];  Service: Gastroenterology;  Laterality: N/A;   EUS N/A 04/06/2017   Procedure: UPPER ENDOSCOPIC ULTRASOUND (EUS) LINEAR;  Surgeon: Teressa Toribio SQUIBB, MD;  Location: [facility];  Service: Endoscopy;  Laterality: N/A;  to evaluate duodenal mass   HEMOSTASIS CLIP  PLACEMENT  07/21/2023   Procedure: HEMOSTASIS CLIP PLACEMENT;  Surgeon: Charlanne Groom, MD;  Location: [facility];  Service: Gastroenterology;;   HEMOSTASIS CONTROL  07/21/2023   Procedure: HEMOSTASIS CONTROL;  Surgeon: Charlanne Groom, MD;  Location: [facility] ENDOSCOPY;  Service: Gastroenterology;;   HERNIA REPAIR      IR ANGIOGRAM VISCERAL SELECTIVE  07/22/2023   IR BILIARY DRAIN PLACEMENT WITH CHOLANGIOGRAM  03/25/2017   IR CONVERT BILIARY DRAIN TO INT EXT BILIARY DRAIN  04/03/2017   IR EMBO ART  VEN HEMORR LYMPH EXTRAV  INC GUIDE ROADMAPPING  07/22/2023   IR US  GUIDE VASC ACCESS RIGHT  07/22/2023   POLYPECTOMY  05/16/2023   Procedure: POLYPECTOMY;  Surgeon: Aneita Gwendlyn DASEN, MD;  Location: [facility];  Service: Gastroenterology;;    Social History:  reports that she has quit smoking. Her smoking use included cigarettes. She has been exposed to tobacco smoke. She has quit using smokeless tobacco. She reports that she does not drink alcohol and does not use drugs.   Allergies  Allergen Reactions   Codeine Nausea And Vomiting and Other (See Comments)    Made me very sick   Tape Other (See Comments)    Tears skin, as it is VERY THIN!!  - surgical tape   Oxycodone  Other (See Comments)    Hallucinations     Family History  Problem Relation Age of Onset   Hypertension Mother    Prostate cancer Father    Colon cancer Father    Hypertension Brother    Rheum arthritis Maternal Grandmother    Diabetes Paternal Grandmother    Heart disease Paternal Grandfather    Colon cancer Child       Prior to Admission medications   Medication Sig Start Date End Date Taking? Authorizing Provider  diltiazem  (CARDIZEM  SR) 120 MG 12 hr capsule Take 120 mg by mouth daily. 02/24/24  Yes [provider]  Potassium Chloride  ER 20 MEQ TBCR Take 1 tablet by mouth daily. 12/29/23  Yes [provider]  TIADYLT  ER 300 MG 24 hr capsule Take 300 mg by mouth daily. 03/25/24  Yes [provider]  acetaminophen  (TYLENOL ) 500 MG tablet Take 2 tablets (1,000 mg total) by mouth every 8 (eight) hours. 02/23/24   Krishnan, Gokul, MD  albuterol  (VENTOLIN  HFA) [phone] Base) MCG/ACT inhaler Inhale 1-2 puffs into the lungs every 6 (six) hours as needed for wheezing or shortness of breath.    [provider]  allopurinol  (ZYLOPRIM ) 300 MG tablet Take 1 tablet (300 mg total) by mouth daily. 07/18/23   Rothfuss, Jacob T, PA-C  amiodarone  (PACERONE ) 200 MG tablet Take 1 tablet (200 mg total) by mouth 2 (two) times daily. 02/23/24   Krishnan, Gokul, MD  Ascorbic Acid (VITAMIN C PO) Take 1 tablet by mouth in the morning.    [provider]  atorvastatin  (LIPITOR) 20 MG tablet Take 1 tablet (20 mg total) by mouth daily. 12/19/22   Walker, Caitlin S, NP  bisacodyl  (DULCOLAX) 10 MG suppository Place 1 suppository (10 mg total) rectally daily as needed for moderate constipation. 02/23/24   Krishnan, Gokul, MD  busPIRone  (BUSPAR ) 10 MG tablet TAKE 1 TABLET BY MOUTH TWICE A DAY 08/22/23   Rothfuss, Jacob T, PA-C  Calcium  Carb-Cholecalciferol  (CALCIUM  600 + D PO) Take 1 tablet by mouth in the morning.    [provider]  Cholecalciferol  (VITAMIN D3 PO) Take 1 capsule by mouth in the morning.    [provider]  cyanocobalamin  (VITAMIN B12) 1000 MCG tablet Take 1,000 mcg by mouth daily. Patient taking differently: Take 500-1,000 mcg by mouth daily.    [provider]  diltiazem  (CARDIZEM  CD) 300 MG 24 hr capsule Take 1 capsule (300 mg total) by mouth daily. 02/23/24   Verdene Purchase, MD  DULoxetine  (CYMBALTA ) 20 MG capsule TAKE 1 CAPSULE BY MOUTH EVERY DAY 10/23/23   Rothfuss, Jacob T, PA-C  ferrous sulfate  325 (65 FE) MG tablet Take 325 mg by mouth daily with breakfast.    [provider]  folic acid  (FOLVITE ) 1 MG tablet Take 1 tablet (1 mg total) by mouth daily. Patient not taking: Reported on 02/10/2024 05/18/23   Verdene Purchase, MD  HYDROmorphone  (DILAUDID ) 2 MG tablet Take 1 tablet (2 mg total) by mouth every 4 (four) hours as needed for severe pain (pain score 7-10). 02/23/24   Krishnan, Gokul, MD  HYDROmorphone  (DILAUDID ) 2 MG tablet Take 1-2 tablets (2-4 mg total) by mouth every 4 (four) hours as needed for severe pain (pain score 7-10). 03/12/24   Doretha Folks,  MD  ipratropium-albuterol  (DUONEB) 0.5-2.5 (3) MG/3ML SOLN Take 3 mLs by nebulization every 6 (six) hours as needed (for shortness of breath or wheezing).    [provider]  KLOR-CON  M20 20 MEQ tablet Take 20 mEq by mouth in the morning and at bedtime.    [provider]  levothyroxine  (SYNTHROID ) 100 MCG tablet Take 1 tablet (100 mcg total) by mouth daily before breakfast. 07/18/23   Rothfuss, Jacob T, PA-C  lidocaine  (LIDODERM ) 5 % Place 1 patch onto the skin daily. Remove & Discard patch within 12 hours or as directed by MD 02/24/24   Krishnan, Gokul, MD  melatonin 3 MG TABS tablet Take 1 tablet (3 mg total) by mouth at bedtime as needed (insomnia). Patient taking differently: Take 3 mg by mouth at bedtime. 05/18/23   Krishnan, Gokul, MD  methocarbamol  (ROBAXIN ) 500 MG tablet TAKE 1 TABLET BY MOUTH EVERY 6 HOURS AS NEEDED FOR MUSCLE SPASMS. 10/03/23   Rothfuss, Jacob T, PA-C  ondansetron  (ZOFRAN ) 4 MG tablet Take 4 mg by mouth every 6 (six) hours as needed for nausea or vomiting.    [provider]  pantoprazole  (PROTONIX ) 40 MG tablet Take 1 tablet (40 mg total) by mouth 2 (two) times daily. 02/23/24   Krishnan, Gokul, MD  polyethylene glycol (MIRALAX  / GLYCOLAX ) 17 g packet Take 17 g by mouth daily. 02/24/24   Krishnan, Gokul, MD  senna-docusate (SENOKOT-S) 8.6-50 MG tablet Take 2 tablets by mouth 2 (two) times daily. 02/23/24   Krishnan, Gokul, MD  sertraline  (ZOLOFT ) 25 MG tablet Take 25 mg by mouth daily.    [provider]  spironolactone  (ALDACTONE ) 25 MG tablet Take 0.5 tablets (12.5 mg total) by mouth daily. 02/24/24   Krishnan, Gokul, MD  sucralfate  (CARAFATE ) 1 g tablet Take 1 tablet (1 g total) by mouth 2 (two) times daily. 02/23/24   Verdene Purchase, MD    Physical Exam: BP 125/70 (BP Location: Right Arm)   Pulse 62   Temp 98.1 F (36.7 C) (Oral)   Resp 17   Ht 5' (1.524 m)   Wt 75.8 kg   SpO2 97%   BMI 32.61 kg/m   General: 78 y.o. year-old  female well developed well nourished in no acute distress.  Alert and oriented x3. Cardiovascular: Regular rate and rhythm with no rubs or gallops.  No thyromegaly or JVD noted.  No lower extremity edema. 2/4  pulses in all 4 extremities. Respiratory: Clear to auscultation with no wheezes or rales. Good inspiratory effort. Abdomen: Soft nontender nondistended with normal bowel sounds x4 quadrants. Muskuloskeletal: No cyanosis, clubbing or edema noted bilaterally Neuro: CN II-XII intact, strength, sensation, reflexes Skin: No ulcerative lesions noted or rashes Psychiatry: Judgement and insight appear normal. Mood is appropriate for condition and setting          Labs on Admission:  Basic Metabolic Panel: Recent Labs  Lab 04/01/24 0033  NA 138  K 4.6  CL 108  CO2 21*  GLUCOSE 98  BUN 26*  CREATININE 1.07*  CALCIUM  9.0   Liver Function Tests: Recent Labs  Lab 04/01/24 0033  AST 34  ALT 33  ALKPHOS 179*  BILITOT 0.4  PROT 6.1*  ALBUMIN 3.5   Recent Labs  Lab 04/01/24 0033  LIPASE 40   No results for input(s): AMMONIA in the last 168 hours. CBC: Recent Labs  Lab 04/01/24 0033  WBC 3.8*  NEUTROABS 2.8  HGB 10.1*  HCT 34.3*  MCV 88.9  PLT 118*   Cardiac Enzymes: No results for input(s): CKTOTAL, CKMB, CKMBINDEX, TROPONINI in the last 168 hours.  BNP (last 3 results) Recent Labs    02/15/24 0503 03/12/24 0311  BNP 633.1* 130.7*    ProBNP (last 3 results) Recent Labs    08/24/23 1529  PROBNP 1,402*    CBG: No results for input(s): GLUCAP in the last 168 hours.  Radiological Exams on Admission: CT ABDOMEN PELVIS W CONTRAST Result Date: 04/01/2024 CLINICAL DATA:  Abdominal pain and back pain. EXAM: CT ABDOMEN AND PELVIS WITH CONTRAST CT LUMBAR SPINE WITHOUT CONTRAST TECHNIQUE: Multidetector CT imaging of the abdomen and pelvis was performed using the standard protocol following bolus administration of intravenous contrast. Multiplanar CT  image reconstructions were created and reviewed. Multidetector CT imaging of the lumbar spine was performed using the standard protocol without the use of contrast. Multiplanar CT image reconstructions were created and reviewed. RADIATION DOSE REDUCTION: This exam was performed according to the departmental dose-optimization program which includes automated exposure control, adjustment of the mA and/or kV according to patient size and/or use of iterative reconstruction technique. CONTRAST:  OMNIPAQUE  IOHEXOL  300 MG/ML  SOLN COMPARISON:  CTA chest, abdomen and pelvis and lumbar spine CT both 03/12/2024. FINDINGS: CT ABDOMEN AND PELVIS WITH CONTRAST FINDINGS Lower chest: There is a small layering right pleural effusion, significantly improved. There is posterior atelectasis in the lung bases. No focal pneumonia. There is mild cardiomegaly. Coronary artery calcifications. No pericardial effusion. There is a moderate-sized hiatal hernia, small amount of fluid again in the hernia sac. Hepatobiliary: No focal liver abnormality is seen. Status post cholecystectomy. There is chronic extrahepatic biliary prominence with common bile duct again 10 mm, with pneumobilia again seen. Pancreas: No mass or ductal dilatation or inflammation. Partial glandular atrophy. Spleen: Borderline prominent. There are numerous scattered subcentimeter hypodensities which are too small to characterize and were not well seen on the last study but probably present at that time, with a definitive increase in the number of lesions compared with CTA abdomen and pelvis 07/20/2023. Findings are nonspecific. Possible etiologies include infectious and neoplastic involvement. Adrenals/Urinary Tract: There is no adrenal mass. There is a 2.7 cm Bosniak 1 cyst in the medial upper pole left kidney, Hounsfield density is 9, and occasional few bilateral Bosniak 2 subcentimeter cortical cysts which are too small to characterize. There is no mass  enhancement. There is no urinary stone or obstruction.  Unremarkable bladder. Stomach/Bowel: Negative for dilatation or wall thickening. An appendix is not seen in this patient. Scattered sigmoid diverticulosis without diverticulitis. Vascular/Lymphatic: Aortic atherosclerosis. No enlarged abdominal or pelvic lymph nodes. Reproductive: Status post hysterectomy. No adnexal masses. Other: None. Musculoskeletal: Osteopenia, degenerative and posttraumatic changes of the spine. 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 T Here are healed fractures of the posteromedial right tenth through twelfth ribs. Mild hip DJD. There are healed fracture deformities of the left pubic rami. CT LUMBAR SPINE WITHOUT CONTRAST FINDINGS Segmentation: Standard. Alignment: Mild dextroscoliosis apex at L2. Mild grade 1 degenerative anterolisthesis L3-4. 6 mm chronic posterosuperior vertebral cortical retropulsion at L2 due to prior L2 burst fracture. No new or worsening alignment abnormality.  No further listhesis. Vertebrae: Diffuse osteopenia. 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 No focal pathologic lesion. As above there is partial hemangiomatous replacement of the T11 and T12 vertebrae with thoracic spine bridging enthesopathy 08/06/2010. No interval worsening is seen in the previously noted L1 upper plate anterior wedge compression fracture. Anterior height loss is 20%, central height loss is 25-30%, posterior height loss about 15%. There is mild posterosuperior cortical retropulsion eccentric to the left which has slightly increased, with increased encroachment on the left subarticular zone. AP thecal sac is 10 mm in the midline. L2 burst fracture with 6 mm posterosuperior cortical retropulsion is unchanged with moderate spinal canal and subarticular zone effacement. This is unchanged as is a mild upper plate concave compression fracture deformity of L3. L4  and L5 remain normal in heights.  There is lumbar spondylosis. Paraspinal and other soft tissues: No acute finding. Disc levels: Just below T12-L1 there is increased effacement of the left ventral thecal sac due to slight increased posterosuperior cortical retropulsion at L1, with encroachment on the subarticular zone. The disc itself is normal in height without herniation. There are slight facet spurs. The foramina are moderately stenotic. Widened L1-2 disc space due to the prior L2 burst fracture. Posterosuperior L2 cortical retropulsion as described above with moderate encroachment on the spinal canal and subarticular zones. Moderate to severe left and mild right L1-2 foraminal stenosis is seen with slight facet spurring. The L2-3 and L3-4 discs are normal in height. There is mild foraminal stenosis and facet hypertrophy. There are mild disc bulge without herniation or canal stenosis. At L4-5, the disc is normal in height. Grade 1 degenerative anterolisthesis is noted with moderate facet hypertrophy, ligamentous thickening, disc bulge and moderate spinal canal and lateral recess stenosis. Relatively mild foraminal stenosis. At L5-S1 there is facet hypertrophy and inferior foraminal bulging of the disc with bilateral moderate to severe foraminal stenosis, but no disc herniation or canal zone narrowing. The SI joints are patent. No sacral insufficiency fracture seen. There is spurring of both SI joints. IMPRESSION: 1. No acute findings in the abdomen or pelvis. 2. Small right pleural effusion, significantly improved. 3. Moderate-sized hiatal hernia. 4. Cardiomegaly with coronary artery and aortic atherosclerosis. 5. Numerous subcentimeter hypodensities in the spleen, too small to characterize but definitively increased in number compared with CTA abdomen and pelvis 07/20/2023. Findings are nonspecific, with possible etiologies including infectious and neoplastic involvement. 6. Osteopenia and degenerative change.  7. L1 upper plate anterior wedge compression fracture with 20% anterior height loss, 25-30% central height loss and 15% posterior height loss. There is mild posterosuperior cortical retropulsion eccentric to the left which has slightly increased, with increased encroachment on the left ventral thecal sac and subarticular  zone. The overall vertebral height loss appears unchanged. 8. L2 burst fracture with 6 mm posterosuperior cortical retropulsion, unchanged. 9. L3 mild upper plate concave compression fracture deformity, unchanged. 10. 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 11. L5-S1 moderate to severe bilateral foraminal stenosis. Other levels with lesser foraminal stenosis as above. 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 Aortic Atherosclerosis (ICD10-I70.0). Electronically Signed   By: Francis Quam M.D.   On: 04/01/2024 02:37   CT L-SPINE NO CHARGE Result Date: 04/01/2024 CLINICAL DATA:  Abdominal pain and back pain. EXAM: CT ABDOMEN AND PELVIS WITH CONTRAST CT LUMBAR SPINE WITHOUT CONTRAST TECHNIQUE: Multidetector CT imaging of the abdomen and pelvis was performed using the standard protocol following bolus administration of intravenous contrast. Multiplanar CT image reconstructions were created and reviewed. Multidetector CT imaging of the lumbar spine was performed using the standard protocol without the use of contrast. Multiplanar CT image reconstructions were created and reviewed. RADIATION DOSE REDUCTION: This exam was performed according to the departmental dose-optimization program which includes automated exposure control, adjustment of the mA and/or kV according to patient size and/or use of iterative reconstruction technique. CONTRAST:  OMNIPAQUE  IOHEXOL  300 MG/ML  SOLN COMPARISON:  CTA chest, abdomen and pelvis and lumbar spine CT both 03/12/2024. FINDINGS: CT ABDOMEN AND PELVIS WITH CONTRAST FINDINGS Lower chest: There is a small layering right pleural effusion, significantly improved.  There is posterior atelectasis in the lung bases. No focal pneumonia. There is mild cardiomegaly. Coronary artery calcifications. No pericardial effusion. There is a moderate-sized hiatal hernia, small amount of fluid again in the hernia sac. Hepatobiliary: No focal liver abnormality is seen. Status post cholecystectomy. There is chronic extrahepatic biliary prominence with common bile duct again 10 mm, with pneumobilia again seen. Pancreas: No mass or ductal dilatation or inflammation. Partial glandular atrophy. Spleen: Borderline prominent. There are numerous scattered subcentimeter hypodensities which are too small to characterize and were not well seen on the last study but probably present at that time, with a definitive increase in the number of lesions compared with CTA abdomen and pelvis 07/20/2023. Findings are nonspecific. Possible etiologies include infectious and neoplastic involvement. Adrenals/Urinary Tract: There is no adrenal mass. There is a 2.7 cm Bosniak 1 cyst in the medial upper pole left kidney, Hounsfield density is 9, and occasional few bilateral Bosniak 2 subcentimeter cortical cysts which are too small to characterize. There is no mass enhancement. There is no urinary stone or obstruction. Unremarkable bladder. Stomach/Bowel: Negative for dilatation or wall thickening. An appendix is not seen in this patient. Scattered sigmoid diverticulosis without diverticulitis. Vascular/Lymphatic: Aortic atherosclerosis. No enlarged abdominal or pelvic lymph nodes. Reproductive: Status post hysterectomy. No adnexal masses. Other: None. Musculoskeletal: Osteopenia, degenerative and posttraumatic changes of the spine. 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 T Here are healed fractures of the posteromedial right tenth through twelfth ribs. Mild hip DJD. There are healed fracture deformities of the left pubic rami. CT LUMBAR SPINE WITHOUT CONTRAST FINDINGS Segmentation: Standard.  Alignment: Mild dextroscoliosis apex at L2. Mild grade 1 degenerative anterolisthesis L3-4. 6 mm chronic posterosuperior vertebral cortical retropulsion at L2 due to prior L2 burst fracture. No new or worsening alignment abnormality.  No further listhesis. Vertebrae: Diffuse osteopenia. 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 No focal pathologic lesion. As above there is partial hemangiomatous replacement of the T11 and T12 vertebrae with thoracic spine bridging enthesopathy 08/06/2010. No interval worsening is seen in the previously noted L1 upper plate anterior  wedge compression fracture. Anterior height loss is 20%, central height loss is 25-30%, posterior height loss about 15%. There is mild posterosuperior cortical retropulsion eccentric to the left which has slightly increased, with increased encroachment on the left subarticular zone. AP thecal sac is 10 mm in the midline. L2 burst fracture with 6 mm posterosuperior cortical retropulsion is unchanged with moderate spinal canal and subarticular zone effacement. This is unchanged as is a mild upper plate concave compression fracture deformity of L3. L4 and L5 remain normal in heights.  There is lumbar spondylosis. Paraspinal and other soft tissues: No acute finding. Disc levels: Just below T12-L1 there is increased effacement of the left ventral thecal sac due to slight increased posterosuperior cortical retropulsion at L1, with encroachment on the subarticular zone. The disc itself is normal in height without herniation. There are slight facet spurs. The foramina are moderately stenotic. Widened L1-2 disc space due to the prior L2 burst fracture. Posterosuperior L2 cortical retropulsion as described above with moderate encroachment on the spinal canal and subarticular zones. Moderate to severe left and mild right L1-2 foraminal stenosis is seen with slight facet spurring. The L2-3 and L3-4 discs are  normal in height. There is mild foraminal stenosis and facet hypertrophy. There are mild disc bulge without herniation or canal stenosis. At L4-5, the disc is normal in height. Grade 1 degenerative anterolisthesis is noted with moderate facet hypertrophy, ligamentous thickening, disc bulge and moderate spinal canal and lateral recess stenosis. Relatively mild foraminal stenosis. At L5-S1 there is facet hypertrophy and inferior foraminal bulging of the disc with bilateral moderate to severe foraminal stenosis, but no disc herniation or canal zone narrowing. The SI joints are patent. No sacral insufficiency fracture seen. There is spurring of both SI joints. IMPRESSION: 1. No acute findings in the abdomen or pelvis. 2. Small right pleural effusion, significantly improved. 3. Moderate-sized hiatal hernia. 4. Cardiomegaly with coronary artery and aortic atherosclerosis. 5. Numerous subcentimeter hypodensities in the spleen, too small to characterize but definitively increased in number compared with CTA abdomen and pelvis 07/20/2023. Findings are nonspecific, with possible etiologies including infectious and neoplastic involvement. 6. Osteopenia and degenerative change. 7. L1 upper plate anterior wedge compression fracture with 20% anterior height loss, 25-30% central height loss and 15% posterior height loss. There is mild posterosuperior cortical retropulsion eccentric to the left which has slightly increased, with increased encroachment on the left ventral thecal sac and subarticular zone. The overall vertebral height loss appears unchanged. 8. L2 burst fracture with 6 mm posterosuperior cortical retropulsion, unchanged. 9. L3 mild upper plate concave compression fracture deformity, unchanged. 10. 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 11. L5-S1 moderate to severe bilateral foraminal stenosis. Other levels with lesser foraminal stenosis as above. 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 Aortic Atherosclerosis  (ICD10-I70.0). Electronically Signed   By: Francis Quam M.D.   On: 04/01/2024 02:37    EKG: I independently viewed the EKG done and my findings are as followed: None available at the time of this visit.  Assessment/Plan Present on Admission:  Intractable back pain  Principal Problem:   Intractable back pain  Intractable back pain in the setting of vertebral lumbar fractures. Continue pain control Follow MRI lumbar spine Consider neurosurgical evaluation  Atrial flutter, previously on Eliquis  Resume home Eliquis  after home meds are reconciled Monitor on telemetry.  Chronic anxiety/depression Resume home regimen.  Hypothyroidism Resume home levothyroxine .   Time: 75 minutes.   DVT prophylaxis: Subcu heparin 3 times daily  Code Status:  Full code.  Family Communication: None at bedside.  Disposition Plan: Admitted to telemetry unit.  Consults called: None.  Admission status: Inpatient status.   Status is: Inpatient The patient requires at least 2 midnights for further evaluation and treatment of present condition.   Terry LOISE Hurst MD [facility] Pager [phone]  If 7PM-7AM, please contact night-coverage [email or web address] Password [email or web address]  04/01/2024, 4:11 AM

## 2024-04-01 NOTE — ED Notes (Signed)
 Placed pt on pure wick and fresh brief; ortho placed back brace on pt.

## 2024-04-01 NOTE — ED Notes (Signed)
 PT unable to ambulate.

## 2024-04-02 DIAGNOSIS — M549 Dorsalgia, unspecified: Secondary | ICD-10-CM | POA: Diagnosis not present

## 2024-04-02 MED ORDER — DILTIAZEM HCL ER COATED BEADS 120 MG PO CP24
120.0000 mg | ORAL_CAPSULE | Freq: Every day | ORAL | Status: DC
  Administered 2024-04-02 – 2024-04-04 (×3): 120 mg via ORAL
  Filled 2024-04-02 (×3): qty 1

## 2024-04-02 MED ORDER — SODIUM CHLORIDE 0.9 % IV SOLN
2.0000 g | Freq: Every day | INTRAVENOUS | Status: DC
  Administered 2024-04-02 – 2024-04-04 (×3): 2 g via INTRAVENOUS
  Filled 2024-04-02 (×3): qty 20

## 2024-04-02 MED ORDER — SENNOSIDES-DOCUSATE SODIUM 8.6-50 MG PO TABS
2.0000 | ORAL_TABLET | Freq: Two times a day (BID) | ORAL | Status: DC
  Administered 2024-04-02 – 2024-04-04 (×5): 2 via ORAL
  Filled 2024-04-02 (×5): qty 2

## 2024-04-02 MED ORDER — METHOCARBAMOL 500 MG PO TABS
500.0000 mg | ORAL_TABLET | Freq: Four times a day (QID) | ORAL | Status: DC | PRN
Start: 2024-04-02 — End: 2024-04-04
  Administered 2024-04-03: 500 mg via ORAL
  Filled 2024-04-02: qty 1

## 2024-04-02 NOTE — Evaluation (Signed)
 Occupational Therapy Evaluation Patient Details Name: Peggy Armstrong MRN: [id number] DOB: August 18, 1945 Today's Date: 04/02/2024   History of Present Illness   78 yo female presenting to therapy following hospital admission on 03/31/2024 due to intractable back pain. Pt has hx of LBP with spinal stenosis, chronic compression fx of L1 and L2, neurosurgery consult and recommends steroids and follow up OP. Pt hospitalized in August of this year due to back pain and low HgB. Pt presented to ED on 12/27/2023 due to R UE and LE pain following pt sliding OOB. On 4/26 pt hospitalized after mechanical fall, fell to L side resulting in L UE and LE fx, per pt and daughter report L UE and LE WBAT.  Pt PMH includes but is not limited to:  HFpEF, paroxysmal A fib on Eliquis , AAA, essential HTN, recurrent GIB, RA, CKD IIIA, falls, L humeral fx, L elbow avulsion fx, L greater trochanter fx, L2 fx, depression, anxiety, hypothyroidism, DVT, HLD, Baastrubp's dz, and duodenal adenocarcinoma.     Clinical Impressions Pt c/o 4/10 back pain, eager to participate in OOB activities. Pt lives with daughter in ground floor apartment, PLOF mod I, from recent SNF stay but wishes to return to home with [facility] services. Pt doing well, min A for STS, ambulated 100 feet with min A to maintain balance at times, LOB 3X with posterior lean, unable to recover without min A. Pt needs help with LB ADLs at baseline, able to use sock aide with increased time. Pt fair BUE strength and ROM to complete UB ADLs. Pt would benefit from continued acute OT to maximize functional strength, safety with mobility, and balance, HHOT follow up recommended, Pt reports she has all DME needed.      If plan is discharge home, recommend the following:   A little help with walking and/or transfers;A little help with bathing/dressing/bathroom;Assistance with cooking/housework;Assist for transportation;Help with stairs or ramp for entrance     Functional Status  Assessment   Patient has had a recent decline in their functional status and demonstrates the ability to make significant improvements in function in a reasonable and predictable amount of time.     Equipment Recommendations   None recommended by OT     Recommendations for Other Services         Precautions/Restrictions   Precautions Precautions: Back;Fall Recall of Precautions/Restrictions: Intact Restrictions Weight Bearing Restrictions Per Provider Order: No     Mobility Bed Mobility Overal bed mobility: Needs Assistance Bed Mobility: Supine to Sit, Sit to Supine     Supine to sit: Min assist Sit to supine: Min assist   General bed mobility comments: min A in/out of bed    Transfers Overall transfer level: Needs assistance Equipment used: Rolling walker (2 wheels) Transfers: Sit to/from Stand, Bed to chair/wheelchair/BSC Sit to Stand: Min assist     Step pivot transfers: Min assist     General transfer comment: min A for STS and to maintain balance, posterior lean at times with LOB      Balance Overall balance assessment: Needs assistance Sitting-balance support: No upper extremity supported, Feet supported Sitting balance-Leahy Scale: Fair     Standing balance support: Bilateral upper extremity supported, During functional activity, Reliant on assistive device for balance Standing balance-Leahy Scale: Poor Standing balance comment: min A to maintain balance with RW, posterior lean with LOB at times  ADL either performed or assessed with clinical judgement   ADL Overall ADL's : Needs assistance/impaired Eating/Feeding: Independent   Grooming: Set up;Sitting   Upper Body Bathing: Minimal assistance;Sitting   Lower Body Bathing: Moderate assistance;Maximal assistance;Sitting/lateral leans;Sit to/from stand   Upper Body Dressing : Minimal assistance;Sitting   Lower Body Dressing: Moderate  assistance;Maximal assistance;Sitting/lateral leans;Sit to/from stand   Toilet Transfer: Minimal assistance;Rolling walker (2 wheels)   Toileting- Clothing Manipulation and Hygiene: Contact guard assist;Sitting/lateral lean;Sit to/from stand       Functional mobility during ADLs: Minimal assistance;Rolling walker (2 wheels) General ADL Comments: Pt doing well, needs help for LB ADLs at baseline, uses sock aide, did have posterior lean with LOB at times requiring min A to maintain standing balance, but able to ambulate 100 feet. Pt min A for UB dressing/bathing.     Vision Baseline Vision/History: 1 Wears glasses Ability to See in Adequate Light: 0 Adequate Patient Visual Report: No change from baseline       Perception         Praxis         Pertinent Vitals/Pain Pain Assessment Pain Assessment: 0-10 Pain Score: 3  Pain Location: back Pain Descriptors / Indicators: Aching, Constant, Discomfort, Grimacing Pain Intervention(s): Monitored during session     Extremity/Trunk Assessment Upper Extremity Assessment Upper Extremity Assessment: Overall WFL for tasks assessed   Lower Extremity Assessment Lower Extremity Assessment: Generalized weakness (B LE edema noted and pt having B LE tremors s/p gait tasks)   Cervical / Trunk Assessment Cervical / Trunk Assessment: Other exceptions (compression fx and back precuations pt is not a canidate for surgical intervention)   Communication Communication Communication: No apparent difficulties   Cognition Arousal: Alert Behavior During Therapy: WFL for tasks assessed/performed Cognition: No apparent impairments                               Following commands: Intact       Cueing  General Comments   Cueing Techniques: Verbal cues      Exercises     Shoulder Instructions      Home Living Family/patient expects to be discharged to:: Private residence Living Arrangements: Children Available Help at  Discharge: Family;Available 24 hours/day Type of Home: Apartment Home Access: Level entry     Home Layout: One level     Bathroom Shower/Tub: Chief Strategy Officer: Standard Bathroom Accessibility: Yes How Accessible: Accessible via walker;Accessible via wheelchair Home Equipment: Grab bars - tub/shower;Rolling Walker (2 wheels);Cane - single point;Tub bench;BSC/3in1;Wheelchair - manual   Additional Comments: Pt lives with daughter who works from home      Prior Functioning/Environment Prior Level of Function : Needs assist             Mobility Comments: pt reports having coming home from SNF on Sunday 10/5 and not being able to move secondary to pain, pt states she was walking 75 feet with RW in rehab, pt reports increased use of wc in apartment prior to last hospital admission ADLs Comments: min A to CGA for all ADLs, self care tasks and IADLs    OT Problem List: Decreased strength;Decreased range of motion;Decreased activity tolerance;Impaired balance (sitting and/or standing);Pain;Impaired UE functional use;Decreased safety awareness   OT Treatment/Interventions: Self-care/ADL training;Therapeutic exercise;DME and/or AE instruction;Manual therapy;Therapeutic activities;Patient/family education;Balance training      OT Goals(Current goals can be found in the care plan section)   Acute Rehab  OT Goals Patient Stated Goal: to improve activity tolerance OT Goal Formulation: With patient Time For Goal Achievement: 04/16/24 Potential to Achieve Goals: Good   OT Frequency:  Min 2X/week    Co-evaluation              AM-PAC OT 6 Clicks Daily Activity     Outcome Measure Help from another person eating meals?: None Help from another person taking care of personal grooming?: A Little Help from another person toileting, which includes using toliet, bedpan, or urinal?: A Little Help from another person bathing (including washing, rinsing, drying)?: A  Lot Help from another person to put on and taking off regular upper body clothing?: A Little Help from another person to put on and taking off regular lower body clothing?: A Lot 6 Click Score: 17   End of Session Equipment Utilized During Treatment: Gait belt;Rolling walker (2 wheels) Nurse Communication: Mobility status;Patient requests pain meds  Activity Tolerance: Patient tolerated treatment well Patient left: in bed;with call bell/phone within reach  OT Visit Diagnosis: Unsteadiness on feet (R26.81);Other abnormalities of gait and mobility (R26.89);Muscle weakness (generalized) (M62.81);Pain Pain - part of body:  (back)                Time: 1453-1520 OT Time Calculation (min): 27 min Charges:  OT General Charges $OT Visit: 1 Visit OT Evaluation $OT Eval Low Complexity: 1 Low OT Treatments $Self Care/Home Management : 8-22 mins  [place], OTR/L   Elouise JONELLE Bott 04/02/2024, 3:35 PM

## 2024-04-02 NOTE — Plan of Care (Signed)

## 2024-04-02 NOTE — Progress Notes (Signed)
 PROGRESS NOTE    Peggy Armstrong  FMW:[id number] DOB: 04/12/1946 DOA: 03/31/2024 PCP: Peggy Armstrong Peggy Armstrong   Brief Narrative: 78 year old with past medical history significant for duodenal adenocarcinoma, anemia of chronic disease, A-flutter on Eliquis , CKD 3B, gout, hypertension, heart failure preserved ejection fraction, AAA, hyperlipidemia, multiple spontaneous vertebral compression fracture presented to the ED complaining of worsening back pain, associated with difficulty standing and  ambulating   Assessment & Plan:   Principal Problem:   Intractable back pain  1-Back Pain acute on chronic  MRI lumbar spine: 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  Chronic compression L1 fracture, chronic L2 fracture with 6mm retropulsion unchanged. mild-to-moderate left lateral recess stenosis at L4-5  -Continue with  lidocaine  patch and home oral Dilaudid . - Continue IV Dilaudid  PRN -Spoke with neurosurgery PA, they recommend steroid dose pack, out patient follow up with Peggy Armstrong.  -Patient report improvement of back pain with steroids.  -Awaiting to work with PT. She would not want to go to rehab if possible.  -Continue with Brace.   2-A-flutter a flutter - Eliquis  was discontinued last hospitalization 8/31st due to GI bleed.  Patient was thought to be high risk for bleeding. - Resume amiodarone  - Cardizem , initially on hold due to  HR in the 50. -Will resume lower dose Cardizem .   Duodenal adenocarcinoma - Follows with Peggy Armstrong in [employer]. -Patient has been deemed not a surgical candidate -Patient is supposed to follow with her oncology for radiation treatment versus oral treatments Will add PPI and increase Carafate  now she was started on steroids.   Chronic anxiety/depression -Continue with  Cymbalta  and BuSpar , Zoloft .   Hypothyroidism: Continue  Synthroid   Chronic diastolic heart failure: Resume spironolactone    Estimated body mass index is 32.61 kg/m as calculated from the following:   Height as of this encounter: 5' (1.524 m).   Weight as of this encounter: 75.8 kg.   DVT prophylaxis: SCD Code Status: Full code Family Communication: care discussed with patient.  Disposition Plan:  Status is: Inpatient Remains inpatient appropriate because: pain management.     Consultants:  Neurosurgery   Procedures:  none Antimicrobials:    Subjective: She report improvement of back pain. Has not work with PT yet.    Objective: Vitals:   04/01/24 2018 04/02/24 0021 04/02/24 0417 04/02/24 1148  BP: (!) 141/79 (!) 144/70 (!) 158/85 (!) 130/105  Pulse: 70 65 68 73  Resp: 16 14 20 15   Temp: 99 F (37.2 C) 98.7 F (37.1 C) 97.6 F (36.4 C) 98.6 F (37 C)  TempSrc: Oral   Oral  SpO2: 98% 94% 98% 98%  Weight:      Height:        Intake/Output Summary (Last 24 hours) at 04/02/2024 1419 Last data filed at 04/02/2024 9162 Gross per 24 hour  Intake 448.7 ml  Output --  Net 448.7 ml   Filed Weights   03/31/24 2244  Weight: 75.8 kg    Examination:  General exam: NAD Respiratory system: CTA Cardiovascular system: S 1, S 2 RRR Gastrointestinal system: BS present, soft, nt Central nervous system: alert, follows command Extremities: Moves BL extremities.    Data Reviewed: I have personally reviewed following labs and imaging studies  CBC: Recent Labs  Lab 04/01/24 0033 04/01/24 0433  WBC 3.8* 3.4*  NEUTROABS 2.8  --   HGB 10.1* 9.4*  HCT 34.3*  32.0*  MCV 88.9 89.9  PLT 118* 116*   Basic Metabolic Panel: Recent Labs  Lab 04/01/24 0033 04/01/24 0433  NA 138  --   K 4.6  --   CL 108  --   CO2 21*  --   GLUCOSE 98  --   BUN 26*  --   CREATININE 1.07* 1.13*  CALCIUM  9.0  --    GFR: Estimated Creatinine Clearance: 37.3 mL/min (A) (by C-G formula based on SCr of 1.13 mg/dL (H)). Liver Function  Tests: Recent Labs  Lab 04/01/24 0033  AST 34  ALT 33  ALKPHOS 179*  BILITOT 0.4  PROT 6.1*  ALBUMIN 3.5   Recent Labs  Lab 04/01/24 0033  LIPASE 40   No results for input(s): AMMONIA in the last 168 hours. Coagulation Profile: No results for input(s): INR, PROTIME in the last 168 hours. Cardiac Enzymes: No results for input(s): CKTOTAL, CKMB, CKMBINDEX, TROPONINI in the last 168 hours. BNP (last 3 results) Recent Labs    08/24/23 1529  PROBNP 1,402*   HbA1C: No results for input(s): HGBA1C in the last 72 hours. CBG: No results for input(s): GLUCAP in the last 168 hours. Lipid Profile: No results for input(s): CHOL, HDL, LDLCALC, TRIG, CHOLHDL, LDLDIRECT in the last 72 hours. Thyroid  Function Tests: No results for input(s): TSH, T4TOTAL, FREET4, T3FREE, THYROIDAB in the last 72 hours. Anemia Panel: No results for input(s): VITAMINB12, FOLATE, FERRITIN, TIBC, IRON , RETICCTPCT in the last 72 hours. Sepsis Labs: No results for input(s): PROCALCITON, LATICACIDVEN in the last 168 hours.  No results found for this or any previous visit (from the past 240 hours).       Radiology Studies: MR LUMBAR SPINE WO CONTRAST Result Date: 04/01/2024 EXAM: MRI LUMBAR SPINE 04/01/2024 07:15:00 AM TECHNIQUE: Multiplanar multisequence MRI of the lumbar spine was performed without the administration of intravenous contrast. COMPARISON: CT of the lumbar spine dated 04/01/2024 and MRI of the lumbar spine dated 03/12/2024. CLINICAL HISTORY: Compression fracture, lumbar. Pt c/o low back pain, eval lumbar compression fx. FINDINGS: BONES AND ALIGNMENT: Dextroscoliosis of the thoracolumbar spine again demonstrated. Mild-to-moderate compression fracture of L1 again demonstrated, which continues to be heterogeneously hyperintense on T2. Chronic burst fracture of L2 also again demonstrated, which is also heterogeneously hyperintense on T2. There  is retropulsion of the posterior superior corner of the vertebral body, by approximately 6 mm, similar to the prior exam. Hemangiomas are again noted within the T11 and T12 vertebrae. At L4-L5, there is degenerative grade 1 anterolisthesis. Other visualized bone marrow signal is unremarkable. SPINAL CORD: The conus terminates normally. SOFT TISSUES: No paraspinal mass. T12-L1: No significant disc herniation. No spinal canal stenosis or neural foraminal narrowing. L1-L2: Moderate-to-severe central spinal canal stenosis and bilateral lateral recess stenosis. There is possible impingement of the L2 nerves in the lateral recesses bilaterally. L2-L3: Mild disc bulging and bilateral facet arthrosis with mild central spinal canal stenosis and bilateral lateral recess stenosis. L3-L4: Mild facet hypertrophic changes. No significant disc herniation. No spinal canal stenosis or neural foraminal narrowing. L4-L5: Degenerative grade 1 anterolisthesis with mild bilateral facet hypertrophy, resulting in mild central spinal canal stenosis, moderate right lateral recess stenosis, and mild-to-moderate left lateral recess stenosis. L5-S1: No significant disc herniation. No spinal canal stenosis or neural foraminal narrowing. IMPRESSION: 1. 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 2. Mild-to-moderate compression fracture of L1 and chronic burst  fracture of L2 with 6 mm retropulsion, unchanged from prior. 3. Mild central spinal canal stenosis and bilateral lateral recess stenosis at L2-3. 4. Mild central spinal canal stenosis, moderate right lateral recess stenosis, and mild-to-moderate left lateral recess stenosis at L4-5. Electronically signed by: Evalene Coho MD 04/01/2024 08:46 AM EDT RP Workstation: [id number]   CT ABDOMEN PELVIS W CONTRAST Result Date: 04/01/2024 CLINICAL DATA:  Abdominal  pain and back pain. EXAM: CT ABDOMEN AND PELVIS WITH CONTRAST CT LUMBAR SPINE WITHOUT CONTRAST TECHNIQUE: Multidetector CT imaging of the abdomen and pelvis was performed using the standard protocol following bolus administration of intravenous contrast. Multiplanar CT image reconstructions were created and reviewed. Multidetector CT imaging of the lumbar spine was performed using the standard protocol without the use of contrast. Multiplanar CT image reconstructions were created and reviewed. RADIATION DOSE REDUCTION: This exam was performed according to the departmental dose-optimization program which includes automated exposure control, adjustment of the mA and/or kV according to patient size and/or use of iterative reconstruction technique. CONTRAST:  OMNIPAQUE  IOHEXOL  300 MG/ML  SOLN COMPARISON:  CTA chest, abdomen and pelvis and lumbar spine CT both 03/12/2024. FINDINGS: CT ABDOMEN AND PELVIS WITH CONTRAST FINDINGS Lower chest: There is a small layering right pleural effusion, significantly improved. There is posterior atelectasis in the lung bases. No focal pneumonia. There is mild cardiomegaly. Coronary artery calcifications. No pericardial effusion. There is a moderate-sized hiatal hernia, small amount of fluid again in the hernia sac. Hepatobiliary: No focal liver abnormality is seen. Status post cholecystectomy. There is chronic extrahepatic biliary prominence with common bile duct again 10 mm, with pneumobilia again seen. Pancreas: No mass or ductal dilatation or inflammation. Partial glandular atrophy. Spleen: Borderline prominent. There are numerous scattered subcentimeter hypodensities which are too small to characterize and were not well seen on the last study but probably present at that time, with a definitive increase in the number of lesions compared with CTA abdomen and pelvis 07/20/2023. Findings are nonspecific. Possible etiologies include infectious and neoplastic involvement.  Adrenals/Urinary Tract: There is no adrenal mass. There is a 2.7 cm Bosniak 1 cyst in the medial upper pole left kidney, Hounsfield density is 9, and occasional few bilateral Bosniak 2 subcentimeter cortical cysts which are too small to characterize. There is no mass enhancement. There is no urinary stone or obstruction. Unremarkable bladder. Stomach/Bowel: Negative for dilatation or wall thickening. An appendix is not seen in this patient. Scattered sigmoid diverticulosis without diverticulitis. Vascular/Lymphatic: Aortic atherosclerosis. No enlarged abdominal or pelvic lymph nodes. Reproductive: Status post hysterectomy. No adnexal masses. Other: None. Musculoskeletal: Osteopenia, degenerative and posttraumatic changes of the spine. 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 T Here are healed fractures of the posteromedial right tenth through twelfth ribs. Mild hip DJD. There are healed fracture deformities of the left pubic rami. CT LUMBAR SPINE WITHOUT CONTRAST FINDINGS Segmentation: Standard. Alignment: Mild dextroscoliosis apex at L2. Mild grade 1 degenerative anterolisthesis L3-4. 6 mm chronic posterosuperior vertebral cortical retropulsion at L2 due to prior L2 burst fracture. No new or worsening alignment abnormality.  No further listhesis. Vertebrae: Diffuse osteopenia. 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 No focal pathologic lesion. As above there is partial hemangiomatous replacement of the T11 and T12 vertebrae with thoracic spine bridging enthesopathy 08/06/2010. No interval worsening is seen in the previously noted L1 upper plate anterior wedge compression fracture. Anterior height loss is 20%, central height loss is 25-30%, posterior height loss about 15%.  There is mild posterosuperior cortical retropulsion eccentric to the left which has slightly increased, with increased encroachment on the left subarticular zone. AP  thecal sac is 10 mm in the midline. L2 burst fracture with 6 mm posterosuperior cortical retropulsion is unchanged with moderate spinal canal and subarticular zone effacement. This is unchanged as is a mild upper plate concave compression fracture deformity of L3. L4 and L5 remain normal in heights.  There is lumbar spondylosis. Paraspinal and other soft tissues: No acute finding. Disc levels: Just below T12-L1 there is increased effacement of the left ventral thecal sac due to slight increased posterosuperior cortical retropulsion at L1, with encroachment on the subarticular zone. The disc itself is normal in height without herniation. There are slight facet spurs. The foramina are moderately stenotic. Widened L1-2 disc space due to the prior L2 burst fracture. Posterosuperior L2 cortical retropulsion as described above with moderate encroachment on the spinal canal and subarticular zones. Moderate to severe left and mild right L1-2 foraminal stenosis is seen with slight facet spurring. The L2-3 and L3-4 discs are normal in height. There is mild foraminal stenosis and facet hypertrophy. There are mild disc bulge without herniation or canal stenosis. At L4-5, the disc is normal in height. Grade 1 degenerative anterolisthesis is noted with moderate facet hypertrophy, ligamentous thickening, disc bulge and moderate spinal canal and lateral recess stenosis. Relatively mild foraminal stenosis. At L5-S1 there is facet hypertrophy and inferior foraminal bulging of the disc with bilateral moderate to severe foraminal stenosis, but no disc herniation or canal zone narrowing. The SI joints are patent. No sacral insufficiency fracture seen. There is spurring of both SI joints. IMPRESSION: 1. No acute findings in the abdomen or pelvis. 2. Small right pleural effusion, significantly improved. 3. Moderate-sized hiatal hernia. 4. Cardiomegaly with coronary artery and aortic atherosclerosis. 5. Numerous subcentimeter hypodensities  in the spleen, too small to characterize but definitively increased in number compared with CTA abdomen and pelvis 07/20/2023. Findings are nonspecific, with possible etiologies including infectious and neoplastic involvement. 6. Osteopenia and degenerative change. 7. L1 upper plate anterior wedge compression fracture with 20% anterior height loss, 25-30% central height loss and 15% posterior height loss. There is mild posterosuperior cortical retropulsion eccentric to the left which has slightly increased, with increased encroachment on the left ventral thecal sac and subarticular zone. The overall vertebral height loss appears unchanged. 8. L2 burst fracture with 6 mm posterosuperior cortical retropulsion, unchanged. 9. L3 mild upper plate concave compression fracture deformity, unchanged. 10. 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 11. L5-S1 moderate to severe bilateral foraminal stenosis. Other levels with lesser foraminal stenosis as above. 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 Aortic Atherosclerosis (ICD10-I70.0). Electronically Signed   By: Francis Quam M.D.   On: 04/01/2024 02:37   CT L-SPINE NO CHARGE Result Date: 04/01/2024 CLINICAL DATA:  Abdominal pain and back pain. EXAM: CT ABDOMEN AND PELVIS WITH CONTRAST CT LUMBAR SPINE WITHOUT CONTRAST TECHNIQUE: Multidetector CT imaging of the abdomen and pelvis was performed using the standard protocol following bolus administration of intravenous contrast. Multiplanar CT image reconstructions were created and reviewed. Multidetector CT imaging of the lumbar spine was performed using the standard protocol without the use of contrast. Multiplanar CT image reconstructions were created and reviewed. RADIATION DOSE REDUCTION: This exam was performed according to the departmental dose-optimization program which includes automated exposure control, adjustment of the mA and/or kV according to patient size and/or use of iterative reconstruction technique. CONTRAST:   OMNIPAQUE  IOHEXOL  300  MG/ML  SOLN COMPARISON:  CTA chest, abdomen and pelvis and lumbar spine CT both 03/12/2024. FINDINGS: CT ABDOMEN AND PELVIS WITH CONTRAST FINDINGS Lower chest: There is a small layering right pleural effusion, significantly improved. There is posterior atelectasis in the lung bases. No focal pneumonia. There is mild cardiomegaly. Coronary artery calcifications. No pericardial effusion. There is a moderate-sized hiatal hernia, small amount of fluid again in the hernia sac. Hepatobiliary: No focal liver abnormality is seen. Status post cholecystectomy. There is chronic extrahepatic biliary prominence with common bile duct again 10 mm, with pneumobilia again seen. Pancreas: No mass or ductal dilatation or inflammation. Partial glandular atrophy. Spleen: Borderline prominent. There are numerous scattered subcentimeter hypodensities which are too small to characterize and were not well seen on the last study but probably present at that time, with a definitive increase in the number of lesions compared with CTA abdomen and pelvis 07/20/2023. Findings are nonspecific. Possible etiologies include infectious and neoplastic involvement. Adrenals/Urinary Tract: There is no adrenal mass. There is a 2.7 cm Bosniak 1 cyst in the medial upper pole left kidney, Hounsfield density is 9, and occasional few bilateral Bosniak 2 subcentimeter cortical cysts which are too small to characterize. There is no mass enhancement. There is no urinary stone or obstruction. Unremarkable bladder. Stomach/Bowel: Negative for dilatation or wall thickening. An appendix is not seen in this patient. Scattered sigmoid diverticulosis without diverticulitis. Vascular/Lymphatic: Aortic atherosclerosis. No enlarged abdominal or pelvic lymph nodes. Reproductive: Status post hysterectomy. No adnexal masses. Other: None. Musculoskeletal: Osteopenia, degenerative and posttraumatic changes of the spine. Partial hemangiomatous  replacement T 11 and 12 vertebral bodies. T Here are healed fractures of the posteromedial right tenth through twelfth ribs. Mild hip DJD. There are healed fracture deformities of the left pubic rami. CT LUMBAR SPINE WITHOUT CONTRAST FINDINGS Segmentation: Standard. Alignment: Mild dextroscoliosis apex at L2. Mild grade 1 degenerative anterolisthesis L3-4. 6 mm chronic posterosuperior vertebral cortical retropulsion at L2 due to prior L2 burst fracture. No new or worsening alignment abnormality.  No further listhesis. Vertebrae: Diffuse osteopenia. 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 No focal pathologic lesion. As above there is partial hemangiomatous replacement of the T11 and T12 vertebrae with thoracic spine bridging enthesopathy 08/06/2010. No interval worsening is seen in the previously noted L1 upper plate anterior wedge compression fracture. Anterior height loss is 20%, central height loss is 25-30%, posterior height loss about 15%. There is mild posterosuperior cortical retropulsion eccentric to the left which has slightly increased, with increased encroachment on the left subarticular zone. AP thecal sac is 10 mm in the midline. L2 burst fracture with 6 mm posterosuperior cortical retropulsion is unchanged with moderate spinal canal and subarticular zone effacement. This is unchanged as is a mild upper plate concave compression fracture deformity of L3. L4 and L5 remain normal in heights.  There is lumbar spondylosis. Paraspinal and other soft tissues: No acute finding. Disc levels: Just below T12-L1 there is increased effacement of the left ventral thecal sac due to slight increased posterosuperior cortical retropulsion at L1, with encroachment on the subarticular zone. The disc itself is normal in height without herniation. There are slight facet spurs. The foramina are moderately stenotic. Widened L1-2 disc space due to the prior L2 burst  fracture. Posterosuperior L2 cortical retropulsion as described above with moderate encroachment on the spinal canal and subarticular zones. Moderate to severe left and mild right L1-2 foraminal stenosis is seen with slight facet spurring. The L2-3  and L3-4 discs are normal in height. There is mild foraminal stenosis and facet hypertrophy. There are mild disc bulge without herniation or canal stenosis. At L4-5, the disc is normal in height. Grade 1 degenerative anterolisthesis is noted with moderate facet hypertrophy, ligamentous thickening, disc bulge and moderate spinal canal and lateral recess stenosis. Relatively mild foraminal stenosis. At L5-S1 there is facet hypertrophy and inferior foraminal bulging of the disc with bilateral moderate to severe foraminal stenosis, but no disc herniation or canal zone narrowing. The SI joints are patent. No sacral insufficiency fracture seen. There is spurring of both SI joints. IMPRESSION: 1. No acute findings in the abdomen or pelvis. 2. Small right pleural effusion, significantly improved. 3. Moderate-sized hiatal hernia. 4. Cardiomegaly with coronary artery and aortic atherosclerosis. 5. Numerous subcentimeter hypodensities in the spleen, too small to characterize but definitively increased in number compared with CTA abdomen and pelvis 07/20/2023. Findings are nonspecific, with possible etiologies including infectious and neoplastic involvement. 6. Osteopenia and degenerative change. 7. L1 upper plate anterior wedge compression fracture with 20% anterior height loss, 25-30% central height loss and 15% posterior height loss. There is mild posterosuperior cortical retropulsion eccentric to the left which has slightly increased, with increased encroachment on the left ventral thecal sac and subarticular zone. The overall vertebral height loss appears unchanged. 8. L2 burst fracture with 6 mm posterosuperior cortical retropulsion, unchanged. 9. L3 mild upper plate concave  compression fracture deformity, unchanged. 10. 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 11. L5-S1 moderate to severe bilateral foraminal stenosis. Other levels with lesser foraminal stenosis as above. 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 Aortic Atherosclerosis (ICD10-I70.0). Electronically Signed   By: Francis Quam M.D.   On: 04/01/2024 02:37        Scheduled Meds:  allopurinol   300 mg Oral Daily   amiodarone   200 mg Oral BID   atorvastatin   20 mg Oral Daily   busPIRone   10 mg Oral BID   DULoxetine   20 mg Oral Daily   feeding supplement  237 mL Oral BID BM   ferrous sulfate   325 mg Oral Q breakfast   folic acid   1 mg Oral Daily   levothyroxine   100 mcg Oral QAC breakfast   lidocaine   1 patch Transdermal Q24H   pantoprazole  (PROTONIX ) IV  40 mg Intravenous Q12H   polyethylene glycol  17 g Oral BID   predniSONE   10 mg Oral 3 x daily with food   [START ON 04/03/2024] predniSONE   10 mg Oral 4X daily taper   predniSONE   20 mg Oral Nightly   sertraline   25 mg Oral Daily   spironolactone   12.5 mg Oral Daily   sucralfate   1 g Oral TID WC & HS   Continuous Infusions:     LOS: 1 day    Time spent: 35 minutes    Shaleka Brines A Kendrix Orman, MD [facility]   If 7PM-7AM, please contact night-coverage [email or web address]  04/02/2024, 2:19 PM

## 2024-04-02 NOTE — Evaluation (Signed)
 Physical Therapy Evaluation Patient Details Name: Peggy Armstrong MRN: [id number] DOB: [birth year] Today's Date: 04/02/2024  History of Present Illness  78 yo female presenting to therapy following hospital admission on 03/31/2024 due to intractable back pain. Pt has hx of LBP with spinal stenosis, chronic compression fx of L1 and L2, neurosurgery consult and recommends steroids and follow up OP. Pt hospitalized in August of this year due to back pain and low HgB. Pt presented to ED on 12/27/2023 due to R UE and LE pain following pt sliding OOB. On 4/26 pt hospitalized after mechanical fall, fell to L side resulting in L UE and LE fx, per pt and daughter report L UE and LE WBAT.  Pt PMH includes but is not limited to:  HFpEF, paroxysmal A fib on Eliquis , AAA, essential HTN, recurrent GIB, RA, CKD IIIA, falls, L humeral fx, L elbow avulsion fx, L greater trochanter fx, L2 fx, depression, anxiety, hypothyroidism, DVT, HLD, Baastrubp's dz, and duodenal adenocarcinoma.  Clinical Impression    Pt admitted with above diagnosis.  Pt currently with functional limitations due to the deficits listed below (see PT Problem List). Pt in bed when PT arrived. Pt reported that her pain was much better 2/10 at rest. Pt reports just having d/c from SNF on 1/5 and unable to move once in home setting. Pt states medication changes while in SNF impacting pain management. Pt required mod A and cues for log roll technique for supine to sit, total A to don TLSO seated EOB, pt able to recall 2/3 back precautions, pt required min A for sit to stand and min A for gait tasks for 65 feet in hallway with RW. Pt left seated in recliner, all needs in place and pain report 3/10. Pt now requesting to have more therapy in hospital setting and d/c home with [facility] services vs returning to SNF. Pt will benefit from acute skilled PT to increase their independence and safety with mobility to allow discharge.         If plan is discharge home,  recommend the following: A little help with walking and/or transfers;A lot of help with bathing/dressing/bathroom;Assistance with cooking/housework;Assist for transportation   Can travel by private vehicle        Equipment Recommendations None recommended by PT  Recommendations for Other Services       Functional Status Assessment Patient has had a recent decline in their functional status and demonstrates the ability to make significant improvements in function in a reasonable and predictable amount of time.     Precautions / Restrictions Precautions Precautions: Back;Fall Restrictions Weight Bearing Restrictions Per Provider Order: No      Mobility  Bed Mobility Overal bed mobility: Needs Assistance Bed Mobility: Supine to Sit     Supine to sit: Mod assist (bed flat)     General bed mobility comments: min cues for log roll, pt able to recall 2/3 back precuations able to recite BLT total to don TLSO seated EOB    Transfers Overall transfer level: Needs assistance Equipment used: Rolling walker (2 wheels) Transfers: Sit to/from Stand Sit to Stand: Min assist           General transfer comment: min cues for safety pull to stand    Ambulation/Gait Ambulation/Gait assistance: Contact guard assist Gait Distance (Feet): 65 Feet Assistive device: Rolling walker (2 wheels) Gait Pattern/deviations: Step-to pattern, Decreased stance time - left Gait velocity: decreased     General Gait Details: pt initial gait pattern  with hop to like with R LE anteirorly and pt able to progress with verbal and self cues to heel to toe reciprocal pattern with step almost through, pt reverted to abn gait pattern with turns and requried min A due to LOB  Stairs            Wheelchair Mobility     Tilt Bed    Modified Rankin (Stroke Patients Only)       Balance Overall balance assessment: History of Falls, Mild deficits observed, not formally tested                                            Pertinent Vitals/Pain Pain Assessment Pain Assessment: 0-10 Pain Score: 3  Pain Location: back Pain Descriptors / Indicators: Aching, Constant, Discomfort, Grimacing Pain Intervention(s): Limited activity within patient's tolerance, Monitored during session, Premedicated before session, Repositioned    Home Living Family/patient expects to be discharged to:: Private residence Living Arrangements: Children Available Help at Discharge: Family;Available 24 hours/day Type of Home: Apartment Home Access: Level entry       Home Layout: One level Home Equipment: Grab bars - tub/shower;Rolling Walker (2 wheels);Cane - single point;Tub bench;BSC/3in1;Wheelchair - manual Additional Comments: pt reports now having first floor apartment    Prior Function Prior Level of Function : Needs assist             Mobility Comments: pt reports having coming home from SNF on Sunday 10/5 and not being able to move secondary to pain, pt states she was walking 78 feet with RW in rehab, pt reports increased use of wc in apartment prior to last hospital admission ADLs Comments: min A to CGA for all ADLs, self care tasks and IADLs     Extremity/Trunk Assessment        Lower Extremity Assessment Lower Extremity Assessment: Generalized weakness (B LE edema noted and pt having B LE tremors s/p gait tasks)    Cervical / Trunk Assessment Cervical / Trunk Assessment: Other exceptions (compression fx and back precuations pt is not a canidate for surgical intervention)  Communication   Communication Communication: No apparent difficulties    Cognition Arousal: Alert Behavior During Therapy: WFL for tasks assessed/performed   PT - Cognitive impairments: No apparent impairments                         Following commands: Intact       Cueing       General Comments      Exercises     Assessment/Plan    PT Assessment Patient needs continued  PT services  PT Problem List Decreased strength;Decreased activity tolerance;Decreased balance;Decreased mobility;Decreased coordination;Pain       PT Treatment Interventions DME instruction;Gait training;Functional mobility training;Therapeutic activities;Therapeutic exercise;Balance training;Neuromuscular re-education;Patient/family education    PT Goals (Current goals can be found in the Care Plan section)  Acute Rehab PT Goals Patient Stated Goal: to be strong enough to go home PT Goal Formulation: With patient Time For Goal Achievement: 04/16/24 Potential to Achieve Goals: Good    Frequency Min 3X/week     Co-evaluation               AM-PAC PT 6 Clicks Mobility  Outcome Measure Help needed turning from your back to your side while in a flat bed without using bedrails?: A Little  Help needed moving from lying on your back to sitting on the side of a flat bed without using bedrails?: A Little Help needed moving to and from a bed to a chair (including a wheelchair)?: A Little Help needed standing up from a chair using your arms (e.g., wheelchair or bedside chair)?: A Little Help needed to walk in hospital room?: A Little Help needed climbing 3-5 steps with a railing? : A Lot 6 Click Score: 17    End of Session Equipment Utilized During Treatment: Gait belt;Back brace Activity Tolerance: No increased pain;Patient tolerated treatment well Patient left: in chair;with call bell/phone within reach Nurse Communication: Mobility status PT Visit Diagnosis: Other abnormalities of gait and mobility (R26.89);Muscle weakness (generalized) (M62.81);Difficulty in walking, not elsewhere classified (R26.2);Pain Pain - part of body:  (back)    Time: 1205-1239 PT Time Calculation (min) (ACUTE ONLY): 34 min   Charges:   PT Evaluation $PT Eval Low Complexity: 1 Low PT Treatments $Gait Training: 8-22 mins PT General Charges $$ ACUTE PT VISIT: 1 Visit        [place], PT Acute  Rehab   [place] VEAR Drone 04/02/2024, 1:03 PM

## 2024-04-03 DIAGNOSIS — M549 Dorsalgia, unspecified: Secondary | ICD-10-CM | POA: Diagnosis not present

## 2024-04-03 LAB — CBC WITH DIFFERENTIAL/PLATELET
Abs Immature Granulocytes: 0.07 K/uL (ref 0.00–0.07)
Basophils Absolute: 0 K/uL (ref 0.0–0.1)
Basophils Relative: 0 %
Eosinophils Absolute: 0 K/uL (ref 0.0–0.5)
Eosinophils Relative: 0 %
HCT: 36.1 % (ref 36.0–46.0)
Hemoglobin: 10.4 g/dL — ABNORMAL LOW (ref 12.0–15.0)
Immature Granulocytes: 1 %
Lymphocytes Relative: 10 %
Lymphs Abs: 0.5 K/uL — ABNORMAL LOW (ref 0.7–4.0)
MCH: 26.1 pg (ref 26.0–34.0)
MCHC: 28.8 g/dL — ABNORMAL LOW (ref 30.0–36.0)
MCV: 90.5 fL (ref 80.0–100.0)
Monocytes Absolute: 0.2 K/uL (ref 0.1–1.0)
Monocytes Relative: 4 %
Neutro Abs: 4.2 K/uL (ref 1.7–7.7)
Neutrophils Relative %: 85 %
Platelets: 158 K/uL (ref 150–400)
RBC: 3.99 MIL/uL (ref 3.87–5.11)
RDW: 16.1 % — ABNORMAL HIGH (ref 11.5–15.5)
WBC: 5 K/uL (ref 4.0–10.5)
nRBC: 0 % (ref 0.0–0.2)

## 2024-04-03 LAB — BASIC METABOLIC PANEL WITH GFR
Anion gap: 12 (ref 5–15)
BUN: 36 mg/dL — ABNORMAL HIGH (ref 8–23)
CO2: 22 mmol/L (ref 22–32)
Calcium: 9.1 mg/dL (ref 8.9–10.3)
Chloride: 103 mmol/L (ref 98–111)
Creatinine, Ser: 0.82 mg/dL (ref 0.44–1.00)
GFR, Estimated: >60 mL/min (ref >60)
Glucose, Bld: 116 mg/dL — ABNORMAL HIGH (ref 70–99)
Potassium: 3.7 mmol/L (ref 3.5–5.1)
Sodium: 137 mmol/L (ref 135–145)

## 2024-04-03 LAB — URINE CULTURE: Culture: >=100000 — AB

## 2024-04-03 NOTE — Progress Notes (Signed)
 Physical Therapy Treatment Patient Details Name: Peggy Armstrong MRN: [id number] DOB: 06-Apr-1946 Today's Date: 04/03/2024   History of Present Illness 78 yo female presenting to therapy following hospital admission on 03/31/2024 due to intractable back pain. Pt has hx of LBP with spinal stenosis, chronic compression fx of L1 and L2, neurosurgery consult and recommends steroids and follow up OP. Pt hospitalized in August of this year due to back pain and low HgB. Pt presented to ED on 12/27/2023 due to R UE and LE pain following pt sliding OOB. On 4/26 pt hospitalized after mechanical fall, fell to L side resulting in L UE and LE fx, per pt and daughter report L UE and LE WBAT.  Pt PMH includes but is not limited to:  HFpEF, paroxysmal A fib on Eliquis , AAA, essential HTN, recurrent GIB, RA, CKD IIIA, falls, L humeral fx, L elbow avulsion fx, L greater trochanter fx, L2 fx, depression, anxiety, hypothyroidism, DVT, HLD, Baastrubp's dz, and duodenal adenocarcinoma.    PT Comments   Pt admitted with above diagnosis.  Pt currently with functional limitations due to the deficits listed below (see PT Problem List). PT returned later in the evening and pt agreeable to therapy intervention. Pt reports pain is better today and she is ready to get up and OOB. Pt required use of hospital bed with min A for trunk and ongoing cues for log roll and back precautions. Pt continues to be able to recite BLT but 2/3 precautions verbalized. Pt requires total A to don TLSO seated EOB, pt required CGA for sit to stand  from EOB, min cues and able to ambulate 80 feet in hallway with RW and CGA, retrograde stepping pattern with LOB posteriorly and min A for recovery. Pt left seated in recliner and all needs in place. Pt will benefit from acute skilled PT to increase their independence and safety with mobility to allow discharge.      If plan is discharge home, recommend the following: A little help with walking and/or transfers;A  lot of help with bathing/dressing/bathroom;Assistance with cooking/housework;Assist for transportation   Can travel by private vehicle        Equipment Recommendations  None recommended by PT    Recommendations for Other Services       Precautions / Restrictions Precautions Precautions: Back;Fall Recall of Precautions/Restrictions: Intact Restrictions Weight Bearing Restrictions Per Provider Order: No     Mobility  Bed Mobility Overal bed mobility: Needs Assistance Bed Mobility: Supine to Sit     Supine to sit: Min assist     General bed mobility comments: min A for trunk with increased time and use of hospital bed and cues for log roll    Transfers Overall transfer level: Needs assistance Equipment used: Rolling walker (2 wheels) Transfers: Sit to/from Stand Sit to Stand: Contact guard assist           General transfer comment: cues for safety and improved abiltiy to power up with CGA    Ambulation/Gait Ambulation/Gait assistance: Contact guard assist Gait Distance (Feet): 80 Feet Assistive device: Rolling walker (2 wheels) Gait Pattern/deviations: Step-to pattern, Decreased stance time - left Gait velocity: decreased     General Gait Details: pt initial gait pattern with hop to like with R LE anteirorly with pt reporting leg length discrepancy with L > R pt able to quickly modify for more normalized pattern with minimal cues from PT and self cues to heel to toe reciprocal pattern with step almost through with  improved continutiy of gait pattern with turns and approach to sitting surfaces, LOB with retrograde stepping patterns with min A and cues for recover with use of RW   Stairs             Wheelchair Mobility     Tilt Bed    Modified Rankin (Stroke Patients Only)       Balance Overall balance assessment: Needs assistance Sitting-balance support: No upper extremity supported, Feet supported Sitting balance-Leahy Scale: Fair      Standing balance support: Bilateral upper extremity supported, During functional activity, Reliant on assistive device for balance Standing balance-Leahy Scale: Poor Standing balance comment: CGA and B UE support at [employer] Communication Communication: No apparent difficulties  Cognition Arousal: Alert Behavior During Therapy: WFL for tasks assessed/performed   PT - Cognitive impairments: No apparent impairments                         Following commands: Intact      Cueing Cueing Techniques: Verbal cues  Exercises      General Comments        Pertinent Vitals/Pain Pain Assessment Pain Assessment: 0-10 Pain Score: 2  Breathing: normal Negative Vocalization: none Facial Expression: smiling or inexpressive Body Language: relaxed Consolability: no need to console PAINAD Score: 0 Pain Location: back Pain Descriptors / Indicators: Aching, Constant, Discomfort, Grimacing Pain Intervention(s): Limited activity within patient's tolerance, Monitored during session, Repositioned    Home Living                          Prior Function            PT Goals (current goals can now be found in the care plan section) Acute Rehab PT Goals Patient Stated Goal: to be strong enough to go home PT Goal Formulation: With patient Time For Goal Achievement: 04/16/24 Potential to Achieve Goals: Good Progress towards PT goals: Progressing toward goals    Frequency    Min 3X/week      PT Plan      Co-evaluation              AM-PAC PT 6 Clicks Mobility   Outcome Measure  Help needed turning from your back to your side while in a flat bed without using bedrails?: A Little Help needed moving from lying on your back to sitting on the side of a flat bed without using bedrails?: A Little Help needed moving to and from a bed to a chair (including a wheelchair)?: A Little Help needed standing up from a  chair using your arms (e.g., wheelchair or bedside chair)?: A Little Help needed to walk in hospital room?: A Little Help needed climbing 3-5 steps with a railing? : A Lot 6 Click Score: 17    End of Session Equipment Utilized During Treatment: Gait belt;Back brace Activity Tolerance: No increased pain;Patient tolerated treatment well Patient left: in chair;with call bell/phone within reach Nurse Communication: Mobility status PT Visit Diagnosis: Other abnormalities of gait and mobility (R26.89);Muscle weakness (generalized) (M62.81);Difficulty in walking, not elsewhere classified (R26.2);Pain Pain - part of body:  (back)     Time: 8254-8191 PT Time Calculation (min) (ACUTE ONLY): 23 min  Charges:    $Gait Training: 8-22 mins $Therapeutic Activity: 8-22 mins  PT General Charges $$ ACUTE PT VISIT: 1 Visit                     [place], PT Acute Rehab    [place] VEAR Drone 04/03/2024, 7:00 PM

## 2024-04-03 NOTE — Progress Notes (Signed)
 PROGRESS NOTE    Peggy Armstrong  FMW:[id number] DOB: Nov 18, 1945 DOA: 03/31/2024 PCP: Iven [name] Peggy Armstrong   Brief Narrative:  78 year old with past medical history significant for duodenal adenocarcinoma, anemia of chronic disease, A-flutter on Eliquis , CKD 3B, gout, hypertension, heart failure preserved ejection fraction, AAA, hyperlipidemia, multiple spontaneous vertebral compression fracture presented to the ED complaining of worsening back pain, associated with difficulty standing and ambulating   Assessment & Plan:   Principal Problem:   Intractable back pain  1-acute on chronic back pain secondary to moderate to severe central spinal stenosis and bilateral recess stenosis at L1-2 with possible impingement of the L 2 nerves lateral recess bilateral.  Chronic compression L1 fracture, chronic L2 fracture with 6mm retropulsion unchanged. mild-to-moderate left lateral recess stenosis at L4-5: -Continue with  lidocaine  patch and home oral Dilaudid . -Previous physician/hospitalist spoke with neurosurgery PA, they recommend steroid dose pack, out patient follow up with Dr Smitty.  She was started on steroids, per patient, yesterday was a better day today, her pain is slightly worse today.  She was seen by PT OT yesterday, they recommended home with home health.  Patient does not feel ready to go home today due to worsened pain.  Continue with the brace and continue pain management.  Reassess tomorrow.   2-A-flutter a flutter - Eliquis  was discontinued last hospitalization 8/31st due to GI bleed.  Patient was thought to be high risk for bleeding. - Resumed amiodarone  - Cardizem , initially on hold due to  HR in the 50.  Resumed by previous hospitalist at low-dose.   Duodenal adenocarcinoma - Follows with Dr. Ezzard in [place]. -Patient has been deemed not a surgical candidate -Patient is supposed to follow with her oncology for radiation treatment versus oral treatments.  On PPI and  Carafate  due to being on steroids.   Chronic anxiety/depression -Continue with  Cymbalta  and BuSpar , Zoloft .    Hypothyroidism: Continue Synthroid    Chronic diastolic heart failure: Resume spironolactone   DVT prophylaxis: Place and maintain sequential compression device Start: 04/01/24 1307   Code Status: Full Code  Family Communication:  None present at bedside.  Plan of care discussed with patient in length and he/she verbalized understanding and agreed with it.  Status is: Inpatient Remains inpatient appropriate because: For pain control      Estimated body mass index is 32.61 kg/m as calculated from the following:   Height as of this encounter: 5' (1.524 m).   Weight as of this encounter: 75.8 kg.    Nutritional Assessment: Body mass index is 32.61 kg/m.[name] Seen by dietician.  I agree with the assessment and plan as outlined below: Nutrition Status:        . Skin Assessment: I have examined the patient's skin and I agree with the wound assessment as performed by the wound care RN as outlined below:    Consultants:  Neurosurgery curb sided  Procedures:  None  Antimicrobials:  Anti-infectives (From admission, onward)    Start     Dose/Rate Route Frequency Ordered Stop   04/02/24 1845  cefTRIAXone  (ROCEPHIN ) 2 g in sodium chloride  0.9 % 100 mL IVPB        2 g 200 mL/hr over 30 Minutes Intravenous Daily 04/02/24 1837           Subjective: Seen and examined, she says that she was feeling better yesterday but today her low back pain is worse.  She does not feel comfortable going home today.  Objective: Vitals:  04/02/24 2129 04/03/24 0646 04/03/24 0654 04/03/24 0810  BP: (!) 162/84 (!) 163/100 (!) 164/87 (!) 152/72  Pulse: 68 62 62 66  Resp: 20 18    Temp: 99 F (37.2 C) 98 F (36.7 C)    TempSrc: Oral Oral    SpO2: 96% 97%  99%  Weight:      Height:        Intake/Output Summary (Last 24 hours) at 04/03/2024 1044 Last data filed at 04/03/2024  0700 Gross per 24 hour  Intake 100 ml  Output 300 ml  Net -200 ml   Filed Weights   03/31/24 2244  Weight: 75.8 kg    Examination:  General exam: Appears calm and comfortable  Respiratory system: Clear to auscultation. Respiratory effort normal. Cardiovascular system: S1 & S2 heard, RRR. No JVD, murmurs, rubs, gallops or clicks. No pedal edema. Gastrointestinal system: Abdomen is nondistended, soft and nontender. No organomegaly or masses felt. Normal bowel sounds heard. Central nervous system: Alert and oriented. No focal neurological deficits. Extremities: Symmetric 5 x 5 power. Skin: No rashes, lesions or ulcers Psychiatry: Judgement and insight appear normal. Mood & affect appropriate.    Data Reviewed: I have personally reviewed following labs and imaging studies  CBC: Recent Labs  Lab 04/01/24 0033 04/01/24 0433  WBC 3.8* 3.4*  NEUTROABS 2.8  --   HGB 10.1* 9.4*  HCT 34.3* 32.0*  MCV 88.9 89.9  PLT 118* 116*   Basic Metabolic Panel: Recent Labs  Lab 04/01/24 0033 04/01/24 0433  NA 138  --   K 4.6  --   CL 108  --   CO2 21*  --   GLUCOSE 98  --   BUN 26*  --   CREATININE 1.07* 1.13*  CALCIUM  9.0  --    GFR: Estimated Creatinine Clearance: 37.3 mL/min (A) (by C-G formula based on SCr of 1.13 mg/dL (H)). Liver Function Tests: Recent Labs  Lab 04/01/24 0033  AST 34  ALT 33  ALKPHOS 179*  BILITOT 0.4  PROT 6.1*  ALBUMIN 3.5   Recent Labs  Lab 04/01/24 0033  LIPASE 40   No results for input(s): AMMONIA in the last 168 hours. Coagulation Profile: No results for input(s): INR, PROTIME in the last 168 hours. Cardiac Enzymes: No results for input(s): CKTOTAL, CKMB, CKMBINDEX, TROPONINI in the last 168 hours. BNP (last 3 results) Recent Labs    08/24/23 1529  PROBNP 1,402*   HbA1C: No results for input(s): HGBA1C in the last 72 hours. CBG: No results for input(s): GLUCAP in the last 168 hours. Lipid Profile: No results  for input(s): CHOL, HDL, LDLCALC, TRIG, CHOLHDL, LDLDIRECT in the last 72 hours. Thyroid  Function Tests: No results for input(s): TSH, T4TOTAL, FREET4, T3FREE, THYROIDAB in the last 72 hours. Anemia Panel: No results for input(s): VITAMINB12, FOLATE, FERRITIN, TIBC, IRON , RETICCTPCT in the last 72 hours. Sepsis Labs: No results for input(s): PROCALCITON, LATICACIDVEN in the last 168 hours.  Recent Results (from the past 240 hours)  Urine Culture (for pregnant, neutropenic or urologic patients or patients with an indwelling urinary catheter)     Status: Abnormal (Preliminary result)   Collection Time: 04/01/24  5:04 PM   Specimen: Urine, Clean Catch  Result Value Ref Range Status   Specimen Description   Final    URINE, CLEAN CATCH Performed at [facility], [address] [place], [place] [address]    Special Requests   Final    NONE Performed at [facility], [address], [place], [place] [address]    Culture (A)  Final    >=100,000 COLONIES/mL GRAM NEGATIVE RODS CULTURE REINCUBATED FOR BETTER GROWTH Performed at [facility], [address], [place], [place] [address]    Report Status PENDING  Incomplete     Radiology Studies: No results found.  Scheduled Meds:  allopurinol   300 mg Oral Daily   amiodarone   200 mg Oral BID   atorvastatin   20 mg Oral Daily   busPIRone   10 mg Oral BID   diltiazem   120 mg Oral Daily   DULoxetine   20 mg Oral Daily   feeding supplement  237 mL Oral BID BM   ferrous sulfate   325 mg Oral Q breakfast   folic acid   1 mg Oral Daily   levothyroxine   100 mcg Oral QAC breakfast   lidocaine   1 patch Transdermal Q24H   pantoprazole  (PROTONIX ) IV  40 mg Intravenous Q12H   polyethylene glycol  17 g Oral BID   predniSONE   10 mg Oral 4X daily taper   senna-docusate  2 tablet Oral BID   sertraline   25 mg Oral Daily   spironolactone   12.5 mg Oral Daily   sucralfate   1  g Oral TID WC & HS   Continuous Infusions:  cefTRIAXone  (ROCEPHIN )  IV 2 g (04/03/24 0958)     LOS: 2 days   Fredia Skeeter, MD [facility]  04/03/2024, 10:44 AM   *Please note that this is a verbal dictation therefore any spelling or grammatical errors are due to the Dragon Medical One system interpretation.  Please page via Amion and do not message via secure chat for urgent patient care matters. Secure chat can be used for non urgent patient care matters.  How to contact the [facility] Attending or Consulting provider 7A - 7P or covering provider during after hours 7P -7A, for this patient?  Check the care team in [facility] and look for a) attending/consulting [facility] provider listed and b) the [facility] team listed. Page or secure chat 7A-7P. Log into [email or web address] and use [place]'s universal password to access. If you do not have the password, please contact the hospital operator. Locate the [facility] provider you are looking for under [facility] and page to a number that you can be directly reached. If you still have difficulty reaching the provider, please page the [facility] (Director on Call) for the Hospitalists listed on amion for assistance.

## 2024-04-03 NOTE — Plan of Care (Signed)

## 2024-04-03 NOTE — TOC Initial Note (Signed)
 Transition of Care [facility]) - Initial/Assessment Note    Patient Details  Name: Peggy Armstrong MRN: [id number] Date of Birth: 1945-12-11  Transition of Care [facility]) CM/SW Contact:    Doneta Glenys DASEN, RN Phone Number: 04/03/2024, 4:54 PM  Clinical Narrative:                  Met with patient at bedside to introduce role of TOC/NCM and review for dc planning, PT recommendation for [facility] PT. Patient agreeable to [facility] PT/OT/RN, states she was receiving [facility] services with [facility] prior to admission. Referral will be sent via HUB to [facility]. Patient has PCP on file, reports she resides with her daughter and grand daughter, home DME: RW, W/C, BSC, shower chair. Patient requesting PTAR transport at discharge. Reports her daughter is currently receiving chemotherapy and will not be unable to transport her home.  IP CM following.    Patient Goals and CMS Choice            Expected Discharge Plan and Services                                              Prior Living Arrangements/Services                       Activities of Daily Living   ADL Screening (condition at time of admission) Independently performs ADLs?: No Does the patient have a NEW difficulty with bathing/dressing/toileting/self-feeding that is expected to last >3 days?: No Does the patient have a NEW difficulty with getting in/out of bed, walking, or climbing stairs that is expected to last >3 days?: No Does the patient have a NEW difficulty with communication that is expected to last >3 days?: No Is the patient deaf or have difficulty hearing?: No Does the patient have difficulty seeing, even when wearing glasses/contacts?: No Does the patient have difficulty concentrating, remembering, or making decisions?: No  Permission Sought/Granted                  Emotional Assessment              Admission diagnosis:  Gait difficulty [R26.9] Intractable back pain [M54.9] Patient Active Problem  List   Diagnosis Date Noted   Intractable back pain 04/01/2024   Pain 02/21/2024   Constipation 02/21/2024   Medication management 02/21/2024   Goals of care, counseling/discussion 02/20/2024   Palliative care encounter 02/20/2024   Anemia due to blood loss, acute 02/15/2024   IDA (iron  deficiency anemia) 02/15/2024   Anemia 02/14/2024   Mucosal abnormality of esophagus 02/14/2024   Chronic anticoagulation 02/12/2024   Acute on chronic anemia 02/12/2024   Severe anemia 02/10/2024   Closed compression fracture of body of L1 vertebra (HCC) 02/10/2024   Lumbar radiculopathy 12/15/2023   Acquired hypothyroidism    Avulsion injury 10/25/2023   Disorder of bone 10/25/2023   Displacement of bone 10/25/2023   Obesity with body mass index 30 or greater 10/25/2023   Fall at home, initial encounter 10/21/2023   Left humeral fracture 10/21/2023   Left elbow avulsion fracture 10/21/2023   History of GI bleed 10/21/2023   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   History of depression 10/21/2023   Atrial fibrillation (HCC)    Back injury    DVT (deep venous thrombosis) (HCC)    Hypertension    Cellulitis  of left arm 08/14/2023   Hemorrhagic shock (HCC) 07/21/2023   ABLA (acute blood loss anemia) 07/21/2023   Acute GI bleeding 07/21/2023   AKI (acute kidney injury) 07/21/2023   GI bleed 07/20/2023   Chronic low back pain 07/18/2023   Upper GI bleed 06/20/2023   Long term current use of anticoagulant therapy 06/20/2023   Reduced mobility 06/20/2023   Need for assistance with personal care 05/21/2023   Unsteadiness on feet 05/21/2023   Difficulty walking 05/19/2023   Dysphagia 05/19/2023   Muscle weakness 05/19/2023   Symbolic dysfunction 05/19/2023   Congestive heart failure (HCC) 05/18/2023   Depressive disorder 05/18/2023   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   Occult blood in stools 05/16/2023   Gastric polyps 05/16/2023   Generalized weakness 05/12/2023   Acute  cystitis 05/12/2023   Dehydration 05/12/2023   Acute prerenal azotemia 05/12/2023   Paroxysmal atrial fibrillation (HCC) 05/12/2023   AAA (abdominal aortic aneurysm) 05/12/2023   Hyperlipidemia 05/12/2023   GAD (generalized anxiety disorder) 05/12/2023   Hypothyroidism 05/12/2023   Closed compression fracture of L2 lumbar vertebra, initial encounter (HCC) 05/11/2023   Restless leg 12/02/2022   Routine general medical examination at a health care facility 12/13/2019   Chronic diastolic CHF (congestive heart failure) (HCC) 11/08/2019   Morbid obesity (HCC) 11/08/2019   History of DVT (deep vein thrombosis) 11/08/2019   Need for immunization against influenza 07/18/2019   Right shoulder pain 07/26/2018   Need for vaccination 04/23/2018   Duodenal ulcer    Hematemesis 11/05/2017   Macrocytic anemia 11/05/2017   Melena 11/05/2017   Nausea & vomiting    Hypokalemia 07/03/2017   Essential hypertension    Gout    Clotting disorder    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 [place] [address] Phone: [phone] Fax: [phone]     Social Drivers of Health (SDOH) Social History: SDOH Screenings   Food Insecurity: No Food Insecurity (04/01/2024)  Housing: Low Risk  (04/01/2024)  Transportation Needs: No Transportation Needs (04/01/2024)  Utilities: Not At Risk (04/01/2024)   Depression (PHQ2-9): Low Risk  (12/22/2023)  Financial Resource Strain: Low Risk  (12/02/2022)   Received from [facility]  Physical Activity: Unknown (04/05/2022)   Received from [facility]  Social Connections: Socially Isolated (04/01/2024)  Stress: No Stress Concern Present (04/05/2022)   Received from [facility]  Tobacco Use: Medium Risk (03/31/2024)   SDOH Interventions:     Readmission Risk Interventions    02/24/2024   11:28 AM 02/12/2024    2:43 PM 07/24/2023    5:23 PM  Readmission Risk Prevention Plan  Transportation Screening Complete Complete Complete  PCP or Specialist Appt within 3-5 Days  Complete Complete  HRI or Home Care Consult  Complete Complete  Social Work Consult for Recovery Care Planning/Counseling   Complete  Palliative Care Screening  Not Applicable  Not Applicable  Medication Review (RN Care Manager) Complete Complete Referral to Pharmacy  PCP or Specialist appointment within 3-5 days of discharge Complete    HRI or Home Care Consult Complete    SW Recovery Care/Counseling Consult Complete    Palliative Care Screening Not Applicable    Skilled Nursing Facility Complete

## 2024-04-03 NOTE — Progress Notes (Signed)
 PT Cancellation Note  Patient Details Name: Peggy Armstrong MRN: [id number] DOB: 1946/05/20   Cancelled Treatment:    Reason Eval/Treat Not Completed: Other (comment). PT arrived 1158 and pt requested to return later in the afternoon. PT to return as schedule allows and continue to follow acutely.   Peggy Armstrong, PT Acute Rehab   Peggy Armstrong Peggy Armstrong 04/03/2024, 2:01 PM

## 2024-04-04 ENCOUNTER — Other Ambulatory Visit (HOSPITAL_COMMUNITY): Payer: Self-pay

## 2024-04-04 ENCOUNTER — Encounter: Payer: Self-pay | Admitting: Pulmonary Disease

## 2024-04-04 DIAGNOSIS — M549 Dorsalgia, unspecified: Secondary | ICD-10-CM | POA: Diagnosis not present

## 2024-04-04 MED ORDER — PREDNISONE 10 MG PO TABS
ORAL_TABLET | ORAL | 0 refills | Status: AC
  Filled 2024-04-04: qty 30, 12d supply, fill #0

## 2024-04-04 MED ORDER — DILTIAZEM HCL ER COATED BEADS 120 MG PO CP24
120.0000 mg | ORAL_CAPSULE | Freq: Every day | ORAL | 0 refills | Status: AC
  Filled 2024-04-04: qty 90, 90d supply, fill #0

## 2024-04-04 NOTE — Progress Notes (Signed)
 Discharge meds in a secure bag delivered to patient in room - placed on window seat- primary nurse updated. PIV removed as noted. PTAR will be called at 1330- patient waiting on family to be at home

## 2024-04-04 NOTE — Progress Notes (Signed)
 I attest to student documentation.  Brek Reece V. Tashaun Obey, MSN-RN Nursing Faculty/Clinical Instructor [facility] Associate Degree Nursing Program

## 2024-04-04 NOTE — Plan of Care (Signed)

## 2024-04-04 NOTE — Discharge Summary (Signed)
 Physician Discharge Summary  Peggy Armstrong FMW:[id number] DOB: January 31, 1946 DOA: 03/31/2024  PCP: Iven [name] DASEN, PA-C  Admit date: 03/31/2024 Discharge date: 04/04/2024 30 Day Unplanned Readmission Risk Score    Flowsheet Row ED to Hosp-Admission (Current) from 03/31/2024 in [facility]  30 Day Unplanned Readmission Risk Score (%) 53.74 Filed at 04/04/2024 0801    This score is the patient's risk of an unplanned readmission within 30 days of being discharged (0 -100%). The score is based on dignosis, age, lab data, medications, orders, and past utilization.   Low:  0-14.9   Medium: 15-21.9   High: 22-29.9   Extreme: 30 and above          Admitted From: Home Disposition: Home  Recommendations for Outpatient Follow-up:  Follow up with PCP in 1-2 weeks Please obtain BMP/CBC in one week Follow-up with neurosurgery/Dr. Lanis in 1 week Please follow up with your PCP on the following pending results: Unresulted Labs (From admission, onward)    None         Home Health: Yes Equipment/Devices: None  Discharge Condition: Stable CODE STATUS: Full code Diet recommendation:  Diet Order             Diet Heart Room service appropriate? Yes; Fluid consistency: Thin  Diet effective now                   Subjective: Patient seen and examined, she says that she is feeling much better, her back pain is improved as well and she feels comfortable going home today.  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  Chronic compression L1 fracture, chronic L2 fracture with 6mm retropulsion unchanged. mild-to-moderate left lateral recess stenosis at L4-5: -Previous hospitalist spoke with neurosurgery PA, they recommend steroid dose pack, out patient follow up with Dr Smitty.  She was started on steroids, per patient, she is feeling much better today and she think she can go home today.  Seen by PT OT, they recommended home health which is arranged for her.  Being discharged on 0 tapering dose of prednisone .   2-A-flutter a flutter - Eliquis  was discontinued last hospitalization 8/31st due to GI bleed.  Patient was thought to be high risk for bleeding.  Resumed amiodarone . - Cardizem , initially on hold due to  HR in the 50.  This was resumed at low-dose and is being discharged on low-dose as well.   Duodenal adenocarcinoma - Follows with Dr. Ezzard in [place]. -Patient has been deemed not a surgical candidate -Patient is supposed to follow with her oncology for radiation treatment versus oral treatments.  On PPI and Carafate  due to being on steroids.   Chronic anxiety/depression -Continue with  Cymbalta  and BuSpar , Zoloft .    Hypothyroidism: Continue Synthroid    Chronic diastolic heart failure: Resume spironolactone   Discharge plan was discussed with patient and/or family member and they verbalized understanding and agreed with it.  Discharge Diagnoses:  Principal Problem:   Intractable back pain    Discharge Instructions   Allergies  as of 04/04/2024       Reactions   Codeine Nausea And Vomiting, Other (See Comments)   Made me very sick   Tape Other (See Comments)   Tears skin, as it is VERY THIN!!  - surgical tape   Oxycodone  Other (See Comments)   Hallucinations         Medication List     TAKE these medications    acetaminophen  500 MG tablet Commonly known as:  TYLENOL  Take 2 tablets (1,000 mg total) by mouth every 8 (eight) hours.   albuterol  108 (90 Base) MCG/ACT inhaler Commonly known as: VENTOLIN  HFA Inhale 1-2 puffs into the lungs every 6 (six) hours as needed for wheezing or shortness of breath.   allopurinol  300 MG tablet Commonly known as: ZYLOPRIM  Take 1 tablet (300 mg total) by mouth daily.   amiodarone  200 MG tablet Commonly known as: PACERONE  Take 1 tablet (200 mg total) by mouth 2 (two) times daily.   atorvastatin  20 MG tablet Commonly known as: LIPITOR Take 1 tablet (20 mg total) by mouth daily.   bisacodyl  10 MG suppository Commonly known as: DULCOLAX Place 1 suppository (10 mg total) rectally daily as needed for moderate constipation.   busPIRone  10 MG tablet Commonly known as: BUSPAR  TAKE 1 TABLET BY MOUTH TWICE A DAY   CALCIUM  600 + D PO Take 1 tablet by mouth in the morning.   cyanocobalamin  1000 MCG tablet Commonly known as: VITAMIN B12 Take 1,000 mcg by mouth daily.   diltiazem  120 MG 24 hr capsule Commonly known as: CARDIZEM  CD Take 1 capsule (120 mg total) by mouth daily. Start taking on: April 05, 2024 What changed:  medication strength how much to take   DULoxetine  20 MG capsule Commonly known as: CYMBALTA  TAKE 1 CAPSULE BY MOUTH EVERY DAY   Enema Enem Place 1 Insert rectally as needed (Constipation).   ferrous sulfate  325 (65 FE) MG tablet Take 325 mg by mouth daily with breakfast.   folic acid  1 MG tablet Commonly known as: FOLVITE  Take 1 tablet (1 mg total) by mouth daily.   HYDROmorphone  2 MG tablet Commonly known as: DILAUDID  Take 1 tablet (2 mg total) by mouth every 4 (four) hours as needed for severe pain (pain score 7-10). What changed: Another medication with the same name was removed. Continue taking this medication, and follow the directions you see here.   ipratropium-albuterol  0.5-2.5 (3) MG/3ML Soln Commonly known as: DUONEB Take 3 mLs by nebulization every 6 (six) hours  as needed (for shortness of breath or wheezing).   Klor-Con  M20 20 MEQ tablet Generic drug: potassium chloride  SA Take 20 mEq by mouth in the morning and at bedtime.   levothyroxine  100 MCG tablet Commonly known as: SYNTHROID  Take 1 tablet (100 mcg total) by mouth daily before breakfast.   lidocaine  5 % Commonly known as: LIDODERM  Place 1 patch onto the skin daily. Remove & Discard patch within 12 hours or as directed by MD   melatonin 3 MG Tabs tablet Take 1 tablet (3 mg total) by mouth at bedtime as needed (insomnia). What changed: when to take this   methocarbamol  500 MG tablet Commonly known as: ROBAXIN  TAKE 1 TABLET BY MOUTH EVERY 6 HOURS AS NEEDED FOR MUSCLE SPASMS.   ondansetron  4 MG tablet Commonly known as: ZOFRAN  Take 4 mg by mouth every 6 (six) hours as needed for nausea or vomiting.   pantoprazole  40 MG tablet Commonly known as: PROTONIX  Take 1 tablet (40  mg total) by mouth 2 (two) times daily.   polyethylene glycol 17 g packet Commonly known as: MIRALAX  / GLYCOLAX  Take 17 g by mouth daily.   Potassium Chloride  ER 20 MEQ Tbcr Take 1 tablet by mouth daily.   predniSONE  10 MG tablet Commonly known as: DELTASONE  Take 4 tablets (40 mg total) by mouth daily for 3 days, THEN 3 tablets (30 mg total) daily for 3 days, THEN 2 tablets (20 mg total) daily for 3 days, THEN 1 tablet (10 mg total) daily for 3 days. Start taking on: April 04, 2024   senna-docusate 8.6-50 MG tablet Commonly known as: Senokot-S Take 2 tablets by mouth 2 (two) times daily.   sertraline  25 MG tablet Commonly known as: ZOLOFT  Take 25 mg by mouth daily.   spironolactone  25 MG tablet Commonly known as: ALDACTONE  Take 0.5 tablets (12.5 mg total) by mouth daily.   sucralfate  1 g tablet Commonly known as: CARAFATE  Take 1 tablet (1 g total) by mouth 2 (two) times daily.   Tiadylt  ER 300 MG 24 hr capsule Generic drug: diltiazem  Take 300 mg by mouth See admin instructions. Give 1 capsule  by mouth in the morning for atrial flutter IF SYSTOLIC IS LESS THAN 120 OR HEART LESS THAN 60   VITAMIN C PO Take 250 mg by mouth in the morning.   VITAMIN D3 PO Take 1,000 mcg by mouth in the morning.        Follow-up Information     Rothfuss, [name] DASEN, PA-C Follow up in 1 week(s).   Specialty: Physician Assistant Contact information: [address] [place] [address] [phone]         Lanis Pupa, MD Follow up in 1 week(s).   Specialty: Neurosurgery Contact information: [address] [place] [address] [phone]                Allergies  Allergen Reactions   Codeine Nausea And Vomiting and Other (See Comments)    Made me very sick   Tape Other (See Comments)    Tears skin, as it is VERY THIN!!  - surgical tape   Oxycodone  Other (See Comments)    Hallucinations     Consultations: Neurosurgery curbsided   Procedures/Studies: MR LUMBAR SPINE WO CONTRAST Result Date: 04/01/2024 EXAM: MRI LUMBAR SPINE 04/01/2024 07:15:00 AM TECHNIQUE: Multiplanar multisequence MRI of the lumbar spine was performed without the administration of intravenous contrast. COMPARISON: CT of the lumbar spine dated 04/01/2024 and MRI of the lumbar spine dated 03/12/2024. CLINICAL HISTORY: Compression fracture, lumbar. Pt c/o low back pain, eval lumbar compression fx. FINDINGS: BONES AND ALIGNMENT: Dextroscoliosis of the thoracolumbar spine again demonstrated. Mild-to-moderate compression fracture of L1 again demonstrated, which continues to be heterogeneously hyperintense on T2. Chronic burst fracture of L2 also again demonstrated, which is also heterogeneously hyperintense on T2. There is retropulsion of the posterior superior corner of the vertebral body, by approximately 6 mm, similar to the prior exam. Hemangiomas are again noted within the T11 and T12 vertebrae. At L4-L5, there is degenerative grade 1 anterolisthesis. Other visualized bone marrow signal  is unremarkable. SPINAL CORD: The conus terminates normally. SOFT TISSUES: No paraspinal mass. T12-L1: No significant disc herniation. No spinal canal stenosis or neural foraminal narrowing. L1-L2: Moderate-to-severe central spinal canal stenosis and bilateral lateral recess stenosis. There is possible impingement of the L2 nerves in the lateral recesses bilaterally. L2-L3: Mild disc bulging and bilateral facet arthrosis with mild central spinal canal stenosis and  bilateral lateral recess stenosis. L3-L4: Mild facet hypertrophic changes. No significant disc herniation. No spinal canal stenosis or neural foraminal narrowing. L4-L5: Degenerative grade 1 anterolisthesis with mild bilateral facet hypertrophy, resulting in mild central spinal canal stenosis, moderate right lateral recess stenosis, and mild-to-moderate left lateral recess stenosis. L5-S1: No significant disc herniation. No spinal canal stenosis or neural foraminal narrowing. IMPRESSION: 1. 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 2. Mild-to-moderate compression fracture of L1 and chronic burst fracture of L2 with 6 mm retropulsion, unchanged from prior. 3. Mild central spinal canal stenosis and bilateral lateral recess stenosis at L2-3. 4. Mild central spinal canal stenosis, moderate right lateral recess stenosis, and mild-to-moderate left lateral recess stenosis at L4-5. Electronically signed by: Evalene Coho MD 04/01/2024 08:46 AM EDT RP Workstation: [id number]   CT ABDOMEN PELVIS W CONTRAST Result Date: 04/01/2024 CLINICAL DATA:  Abdominal pain and back pain. EXAM: CT ABDOMEN AND PELVIS WITH CONTRAST CT LUMBAR SPINE WITHOUT CONTRAST TECHNIQUE: Multidetector CT imaging of the abdomen and pelvis was performed using the standard protocol following bolus administration of intravenous contrast. Multiplanar CT image  reconstructions were created and reviewed. Multidetector CT imaging of the lumbar spine was performed using the standard protocol without the use of contrast. Multiplanar CT image reconstructions were created and reviewed. RADIATION DOSE REDUCTION: This exam was performed according to the departmental dose-optimization program which includes automated exposure control, adjustment of the mA and/or kV according to patient size and/or use of iterative reconstruction technique. CONTRAST:  OMNIPAQUE  IOHEXOL  300 MG/ML  SOLN COMPARISON:  CTA chest, abdomen and pelvis and lumbar spine CT both 03/12/2024. FINDINGS: CT ABDOMEN AND PELVIS WITH CONTRAST FINDINGS Lower chest: There is a small layering right pleural effusion, significantly improved. There is posterior atelectasis in the lung bases. No focal pneumonia. There is mild cardiomegaly. Coronary artery calcifications. No pericardial effusion. There is a moderate-sized hiatal hernia, small amount of fluid again in the hernia sac. Hepatobiliary: No focal liver abnormality is seen. Status post cholecystectomy. There is chronic extrahepatic biliary prominence with common bile duct again 10 mm, with pneumobilia again seen. Pancreas: No mass or ductal dilatation or inflammation. Partial glandular atrophy. Spleen: Borderline prominent. There are numerous scattered subcentimeter hypodensities which are too small to characterize and were not well seen on the last study but probably present at that time, with a definitive increase in the number of lesions compared with CTA abdomen and pelvis 07/20/2023. Findings are nonspecific. Possible etiologies include infectious and neoplastic involvement. Adrenals/Urinary Tract: There is no adrenal mass. There is a 2.7 cm Bosniak 1 cyst in the medial upper pole left kidney, Hounsfield density is 9, and occasional few bilateral Bosniak 2 subcentimeter cortical cysts which are too small to characterize. There is no mass enhancement.  There is no urinary stone or obstruction. Unremarkable bladder. Stomach/Bowel: Negative for dilatation or wall thickening. An appendix is not seen in this patient. Scattered sigmoid diverticulosis without diverticulitis. Vascular/Lymphatic: Aortic atherosclerosis. No enlarged abdominal or pelvic lymph nodes. Reproductive: Status post hysterectomy. No adnexal masses. Other: None. Musculoskeletal: Osteopenia, degenerative and posttraumatic changes of the spine. 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 T Here are healed fractures of the posteromedial right tenth through twelfth ribs. Mild hip DJD. There are healed fracture deformities of the left pubic rami. CT LUMBAR SPINE WITHOUT CONTRAST FINDINGS Segmentation: Standard. Alignment: Mild dextroscoliosis apex at L2. Mild grade 1  degenerative anterolisthesis L3-4. 6 mm chronic posterosuperior vertebral cortical retropulsion at L2 due to prior L2 burst fracture. No new or worsening alignment abnormality.  No further listhesis. Vertebrae: Diffuse osteopenia. 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 No focal pathologic lesion. As above there is partial hemangiomatous replacement of the T11 and T12 vertebrae with thoracic spine bridging enthesopathy 08/06/2010. No interval worsening is seen in the previously noted L1 upper plate anterior wedge compression fracture. Anterior height loss is 20%, central height loss is 25-30%, posterior height loss about 15%. There is mild posterosuperior cortical retropulsion eccentric to the left which has slightly increased, with increased encroachment on the left subarticular zone. AP thecal sac is 10 mm in the midline. L2 burst fracture with 6 mm posterosuperior cortical retropulsion is unchanged with moderate spinal canal and subarticular zone effacement. This is unchanged as is a mild upper plate concave compression fracture deformity of L3. L4 and L5 remain  normal in heights.  There is lumbar spondylosis. Paraspinal and other soft tissues: No acute finding. Disc levels: Just below T12-L1 there is increased effacement of the left ventral thecal sac due to slight increased posterosuperior cortical retropulsion at L1, with encroachment on the subarticular zone. The disc itself is normal in height without herniation. There are slight facet spurs. The foramina are moderately stenotic. Widened L1-2 disc space due to the prior L2 burst fracture. Posterosuperior L2 cortical retropulsion as described above with moderate encroachment on the spinal canal and subarticular zones. Moderate to severe left and mild right L1-2 foraminal stenosis is seen with slight facet spurring. The L2-3 and L3-4 discs are normal in height. There is mild foraminal stenosis and facet hypertrophy. There are mild disc bulge without herniation or canal stenosis. At L4-5, the disc is normal in height. Grade 1 degenerative anterolisthesis is noted with moderate facet hypertrophy, ligamentous thickening, disc bulge and moderate spinal canal and lateral recess stenosis. Relatively mild foraminal stenosis. At L5-S1 there is facet hypertrophy and inferior foraminal bulging of the disc with bilateral moderate to severe foraminal stenosis, but no disc herniation or canal zone narrowing. The SI joints are patent. No sacral insufficiency fracture seen. There is spurring of both SI joints. IMPRESSION: 1. No acute findings in the abdomen or pelvis. 2. Small right pleural effusion, significantly improved. 3. Moderate-sized hiatal hernia. 4. Cardiomegaly with coronary artery and aortic atherosclerosis. 5. Numerous subcentimeter hypodensities in the spleen, too small to characterize but definitively increased in number compared with CTA abdomen and pelvis 07/20/2023. Findings are nonspecific, with possible etiologies including infectious and neoplastic involvement. 6. Osteopenia and degenerative change. 7. L1 upper  plate anterior wedge compression fracture with 20% anterior height loss, 25-30% central height loss and 15% posterior height loss. There is mild posterosuperior cortical retropulsion eccentric to the left which has slightly increased, with increased encroachment on the left ventral thecal sac and subarticular zone. The overall vertebral height loss appears unchanged. 8. L2 burst fracture with 6 mm posterosuperior cortical retropulsion, unchanged. 9. L3 mild upper plate concave compression fracture deformity, unchanged. 10. 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 11. L5-S1 moderate to severe bilateral foraminal stenosis. Other levels with lesser foraminal stenosis as above. 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 Aortic Atherosclerosis (ICD10-I70.0). Electronically Signed   By: Francis Quam M.D.   On: 04/01/2024 02:37   CT L-SPINE NO CHARGE Result Date: 04/01/2024 CLINICAL DATA:  Abdominal pain and back pain. EXAM: CT ABDOMEN AND PELVIS WITH CONTRAST CT LUMBAR SPINE WITHOUT  CONTRAST TECHNIQUE: Multidetector CT imaging of the abdomen and pelvis was performed using the standard protocol following bolus administration of intravenous contrast. Multiplanar CT image reconstructions were created and reviewed. Multidetector CT imaging of the lumbar spine was performed using the standard protocol without the use of contrast. Multiplanar CT image reconstructions were created and reviewed. RADIATION DOSE REDUCTION: This exam was performed according to the departmental dose-optimization program which includes automated exposure control, adjustment of the mA and/or kV according to patient size and/or use of iterative reconstruction technique. CONTRAST:  OMNIPAQUE  IOHEXOL  300 MG/ML  SOLN COMPARISON:  CTA chest, abdomen and pelvis and lumbar spine CT both 03/12/2024. FINDINGS: CT ABDOMEN AND PELVIS WITH CONTRAST FINDINGS Lower chest: There is a small layering right pleural effusion, significantly improved. There is  posterior atelectasis in the lung bases. No focal pneumonia. There is mild cardiomegaly. Coronary artery calcifications. No pericardial effusion. There is a moderate-sized hiatal hernia, small amount of fluid again in the hernia sac. Hepatobiliary: No focal liver abnormality is seen. Status post cholecystectomy. There is chronic extrahepatic biliary prominence with common bile duct again 10 mm, with pneumobilia again seen. Pancreas: No mass or ductal dilatation or inflammation. Partial glandular atrophy. Spleen: Borderline prominent. There are numerous scattered subcentimeter hypodensities which are too small to characterize and were not well seen on the last study but probably present at that time, with a definitive increase in the number of lesions compared with CTA abdomen and pelvis 07/20/2023. Findings are nonspecific. Possible etiologies include infectious and neoplastic involvement. Adrenals/Urinary Tract: There is no adrenal mass. There is a 2.7 cm Bosniak 1 cyst in the medial upper pole left kidney, Hounsfield density is 9, and occasional few bilateral Bosniak 2 subcentimeter cortical cysts which are too small to characterize. There is no mass enhancement. There is no urinary stone or obstruction. Unremarkable bladder. Stomach/Bowel: Negative for dilatation or wall thickening. An appendix is not seen in this patient. Scattered sigmoid diverticulosis without diverticulitis. Vascular/Lymphatic: Aortic atherosclerosis. No enlarged abdominal or pelvic lymph nodes. Reproductive: Status post hysterectomy. No adnexal masses. Other: None. Musculoskeletal: Osteopenia, degenerative and posttraumatic changes of the spine. 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 T Here are healed fractures of the posteromedial right tenth through twelfth ribs. Mild hip DJD. There are healed fracture deformities of the left pubic rami. CT LUMBAR SPINE WITHOUT CONTRAST FINDINGS Segmentation: Standard. Alignment:  Mild dextroscoliosis apex at L2. Mild grade 1 degenerative anterolisthesis L3-4. 6 mm chronic posterosuperior vertebral cortical retropulsion at L2 due to prior L2 burst fracture. No new or worsening alignment abnormality.  No further listhesis. Vertebrae: Diffuse osteopenia. 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 No focal pathologic lesion. As above there is partial hemangiomatous replacement of the T11 and T12 vertebrae with thoracic spine bridging enthesopathy 08/06/2010. No interval worsening is seen in the previously noted L1 upper plate anterior wedge compression fracture. Anterior height loss is 20%, central height loss is 25-30%, posterior height loss about 15%. There is mild posterosuperior cortical retropulsion eccentric to the left which has slightly increased, with increased encroachment on the left subarticular zone. AP thecal sac is 10 mm in the midline. L2 burst fracture with 6 mm posterosuperior cortical retropulsion is unchanged with moderate spinal canal and subarticular zone effacement. This is unchanged as is a mild upper plate concave compression fracture deformity of L3. L4 and L5 remain normal in heights.  There is lumbar spondylosis. Paraspinal and other soft tissues: No acute  finding. Disc levels: Just below T12-L1 there is increased effacement of the left ventral thecal sac due to slight increased posterosuperior cortical retropulsion at L1, with encroachment on the subarticular zone. The disc itself is normal in height without herniation. There are slight facet spurs. The foramina are moderately stenotic. Widened L1-2 disc space due to the prior L2 burst fracture. Posterosuperior L2 cortical retropulsion as described above with moderate encroachment on the spinal canal and subarticular zones. Moderate to severe left and mild right L1-2 foraminal stenosis is seen with slight facet spurring. The L2-3 and L3-4 discs are normal in  height. There is mild foraminal stenosis and facet hypertrophy. There are mild disc bulge without herniation or canal stenosis. At L4-5, the disc is normal in height. Grade 1 degenerative anterolisthesis is noted with moderate facet hypertrophy, ligamentous thickening, disc bulge and moderate spinal canal and lateral recess stenosis. Relatively mild foraminal stenosis. At L5-S1 there is facet hypertrophy and inferior foraminal bulging of the disc with bilateral moderate to severe foraminal stenosis, but no disc herniation or canal zone narrowing. The SI joints are patent. No sacral insufficiency fracture seen. There is spurring of both SI joints. IMPRESSION: 1. No acute findings in the abdomen or pelvis. 2. Small right pleural effusion, significantly improved. 3. Moderate-sized hiatal hernia. 4. Cardiomegaly with coronary artery and aortic atherosclerosis. 5. Numerous subcentimeter hypodensities in the spleen, too small to characterize but definitively increased in number compared with CTA abdomen and pelvis 07/20/2023. Findings are nonspecific, with possible etiologies including infectious and neoplastic involvement. 6. Osteopenia and degenerative change. 7. L1 upper plate anterior wedge compression fracture with 20% anterior height loss, 25-30% central height loss and 15% posterior height loss. There is mild posterosuperior cortical retropulsion eccentric to the left which has slightly increased, with increased encroachment on the left ventral thecal sac and subarticular zone. The overall vertebral height loss appears unchanged. 8. L2 burst fracture with 6 mm posterosuperior cortical retropulsion, unchanged. 9. L3 mild upper plate concave compression fracture deformity, unchanged. 10. 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 11. L5-S1 moderate to severe bilateral foraminal stenosis. Other levels with lesser foraminal stenosis as above. 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 Aortic Atherosclerosis (ICD10-I70.0).  Electronically Signed   By: Francis Quam M.D.   On: 04/01/2024 02:37   MR Lumbar Spine W Wo Contrast Result Date: 03/12/2024 EXAM: MR Lumbar Spine With and Without Intravenous Contrast. 03/12/2024 07:47:54 AM TECHNIQUE: Multiplanar multisequence MRI of the lumbar spine was performed with and without the administration of intravenous contrast. 8mL gadobutrol  (GADAVIST ) 1 MMOL/ML injection. COMPARISON: MRI of the lumbar spine dated 05/13/2023. CLINICAL HISTORY: Low back pain, cancer suspected. FINDINGS: BONES AND ALIGNMENT: There has been interval development of a mild-to-moderate compression deformity of L1 which has approximately 40% of its height centrally. There is bone marrow edema and reactive enhancement. There is also a chronic burst fracture of L2, which also demonstrates edema and contrast enhancement. There is retropulsion of the posterior superior corner of the L2 vertebral body as before, resulting in moderate central spinal canal stenosis at the L1-L2 disc space level. There are hemangiomas present within the T11 and T12 vertebral bodies, as previously noted. SPINAL CORD: The conus terminates normally. SOFT TISSUES: There are simple exophytic cysts arising from the kidneys bilaterally. L1-L2: Mild-to-moderate compression deformity of L1 with approximately 40% of its height centrally. Bone marrow edema and reactive enhancement. Chronic burst fracture of L2 with edema and contrast enhancement. Retropulsion of the posterior superior corner of the L2 vertebral  body resulting in moderate central spinal canal stenosis at the L1-L2 disc space level. L2-L3: No disc herniation. No spinal canal stenosis or neural foraminal narrowing. L3-L4: No disc herniation. No spinal canal stenosis or neural foraminal narrowing. L4-L5: Degenerative grade 1 anterolisthesis with mild-to-moderate central spinal canal stenosis, moderate right lateral recess stenosis, and mild-to-moderate left lateral recess stenosis. L5-S1: No  disc herniation. No spinal canal stenosis or neural foraminal narrowing. IMPRESSION: 1. Interval development of a mild-to-moderate compression deformity of L1 with bone marrow edema and reactive enhancement, indicating that it is acute / subacute. 2. Chronic burst fracture of L2 with edema and contrast enhancement, resulting in moderate central spinal canal stenosis at the L1-2 disc space level. 3. Degenerative grade 1 anterolisthesis at L4-5 with mild-to-moderate central spinal canal stenosis, moderate right lateral recess stenosis, and mild-to-moderate left lateral recess stenosis. Electronically signed by: Evalene Coho MD 03/12/2024 08:18 AM EDT RP Workstation: [id number]   CT Angio Chest/Abd/Pel for Dissection W and/or Wo Contrast Result Date: 03/12/2024 EXAM: CTA CHEST, ABDOMEN AND PELVIS WITHOUT AND WITH CONTRAST 03/12/2024 04:16:28 AM TECHNIQUE: CTA of the chest was performed without and with the administration of intravenous contrast. CTA of the abdomen and pelvis was performed without and with the administration of intravenous contrast. Multiplanar reformatted images are provided for review. MIP images are provided for review. Automated exposure control, iterative reconstruction, and/or weight based adjustment of the mA/kV was utilized to reduce the radiation dose to as low as reasonably achievable. COMPARISON: None available. CLINICAL HISTORY: Acute aortic syndrome (AAS) suspected. 100cc [id number]; Pt complains of worsening low back pain. Hx of L1 fx. States that she feels worse than normal. States that the pain takes her breath away. Hx of CHF. 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 The ascending limb measures approximately 3.7 x 3.7 cm in cross-sectional diameter. There is mild calcific atheromatous disease. There is moderate calcific atheromatous disease within the abdominal aorta. No dissection. PULMONARY ARTERIES: The main pulmonary arteries are dilated,  measuring approximately 3 cm in diameter. The pulmonary arteries are widely patent. No pulmonary embolism with the limits of this exam. GREAT VESSELS OF AORTIC ARCH: No acute finding. No dissection. No arterial occlusion or significant stenosis. CELIAC TRUNK: No acute finding. No occlusion or significant stenosis. SUPERIOR MESENTERIC ARTERY: No acute finding. No occlusion or significant stenosis. INFERIOR MESENTERIC ARTERY: No acute finding. No occlusion or significant stenosis. RENAL ARTERIES: The renal arteries are widely patent. No acute finding. No occlusion or significant stenosis. ILIAC ARTERIES: There is moderate calcific atheromatous disease within the common iliac arteries. The common iliac, external iliac and internal iliac arteries are patent. No occlusion or significant stenosis. CHEST: MEDIASTINUM: The heart is enlarged. There is mild calcific coronary artery disease present. No mediastinal lymphadenopathy. The pericardium demonstrates no acute abnormality. LUNGS AND PLEURA: There is mild dependent atelectasis within the lower lobes bilaterally. There is a mild-to-moderate right-sided pleural effusion and a small left effusion. No focal consolidation or pulmonary edema. No evidence of pneumothorax. THORACIC BONES AND SOFT TISSUES: There are healed fracture deformities of the right posterior rib cage. There is a chronic impacted fracture deformity of the left humeral head and neck. ABDOMEN AND PELVIS: LIVER: The liver is enlarged, measuring approximately 19 cm in length in the midclavicular line. There is mild pneumobilia. GALLBLADDER AND BILE DUCTS: The patient is status post cholecystectomy. SPLEEN: The spleen is enlarged, measuring 14 cm in length. PANCREAS: The pancreas is unremarkable. ADRENAL GLANDS: Bilateral adrenal glands demonstrate  no acute abnormality. KIDNEYS, URETERS AND BLADDER: There is a simple cyst arising superior medially from the superior pole of the left kidney. No stones in the  kidneys or ureters. No hydronephrosis. No perinephric or periureteral stranding. Urinary bladder is unremarkable. GI AND BOWEL: There is a moderate sliding hiatus hernia. Stomach and duodenal sweep demonstrate no acute abnormality. There is no bowel obstruction. No abnormal bowel wall thickening or distension. REPRODUCTIVE: The patient is status post hysterectomy and bilateral salpingo-oophorectomy. PERITONEUM AND RETROPERITONEUM: No ascites or free air. LYMPH NODES: No lymphadenopathy. ABDOMINAL BONES AND SOFT TISSUES: There is a chronic burst fracture of L2. There are mild compression deformities of L1 and L3. There are healed fracture deformities of the left superior and inferior pubic rami. No acute soft tissue abnormality. IMPRESSION: 1. No evidence of acute aortic syndrome. 2. 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 Moderate calcific atheromatous disease within the abdominal aorta and common iliac arteries. 3. Enlarged heart with mild calcific coronary artery disease and dilated main pulmonary arteries. Mild-to-moderate right-sided pleural effusion and small left effusion. 4. Hepatomegaly and splenomegaly. 5. Chronic burst fracture of L2 and mild compression deformities of L1 and L3. Electronically signed by: Evalene Coho MD 03/12/2024 04:45 AM EDT RP Workstation: [id number]   CT L-SPINE NO CHARGE Result Date: 03/12/2024 EXAM: CT OF THE LUMBAR SPINE WITHOUT CONTRAST 03/12/2024 04:16:28 AM TECHNIQUE: CT of the lumbar spine was performed without the administration of intravenous contrast. Multiplanar reformatted images are provided for review. Automated exposure control, iterative reconstruction, and/or weight based adjustment of the mA/kV was utilized to reduce the radiation dose to as low as reasonably achievable. COMPARISON: CT of the lumbar spine dated 02/10/2024. CLINICAL HISTORY: Lumbar radiculopathy, trauma. Pt complains of worsening low back pain. Hx of L1 fx.  States that she feels worse than normal. States that the pain takes her breath away. Hx of CHF. FINDINGS: BONES AND ALIGNMENT: A previously noted compression fracture of L1 has slightly worsened in the interim. It previously measured approximately 19 mm in height anteriorly and now measures 17 mm. There is diffuse downward bowing of the superior endplate. There is a chronic burst fracture of L2 again demonstrated with retropulsion of the posterior superior corner by approximately 6 mm resulting in moderate focal central spinal canal stenosis, similar to the prior study. Bowing of the superior endplate of L3, which is unchanged. There is a mild dextrocurvature of the thoracolumbar spine. DEGENERATIVE CHANGES: There is moderate bilateral facet arthrosis at L3-4, L4-5 and L5-S1. SOFT TISSUES: There is a simple cyst arising posteromedially from the superior pole of the left kidney. IMPRESSION: 1. Slightly worsened compression fracture of L1 with diffuse downward bowing of the superior endplate. 2. Chronic burst fracture of L2 with retropulsion of the posterior superior corner by approximately 6 mm, resulting in moderate focal central spinal canal stenosis, similar to the prior study. 3. Bowing of the superior endplate of L3, unchanged. 4. Mild dextrocurvature of the thoracolumbar spine. 5. Moderate bilateral facet arthrosis at L3-4, L4-5, and L5-S1. Electronically signed by: Evalene Coho MD 03/12/2024 04:32 AM EDT RP Workstation: [id number]   DG Chest Port 1 View Result Date: 03/12/2024 EXAM: 1 VIEW XRAY OF THE CHEST 03/12/2024 03:04:49 AM COMPARISON: 02/18/2024 CLINICAL HISTORY: Shortness of breath and back pain. FINDINGS: LUNGS AND PLEURA: Diffuse interstitial opacities have developed in keeping with mild pulmonary edema. Partial right lower lobe collapse with opacification of the right cardiophrenic angle. HEART AND MEDIASTINUM: Cardiomegaly. Tortuous aorta. Aortic atherosclerosis.  BONES AND SOFT TISSUES:  Chronic healing left proximal humerus fracture. IMPRESSION: 1. Partial right lower lobe collapse with opacification of the right cardiophrenic angle. 2. Diffuse interstitial opacities in keeping with mild pulmonary edema. 3. Cardiomegaly Electronically signed by: Dorethia Molt MD 03/12/2024 03:16 AM EDT RP Workstation: [id number]     Discharge Exam: Vitals:   04/04/24 0416 04/04/24 1000  BP: (!) 142/61 (!) 155/78  Pulse: (!) 54 66  Resp: 17   Temp: 98.3 F (36.8 C) 97.7 F (36.5 C)  SpO2: 97% 98%   Vitals:   04/03/24 1335 04/03/24 2000 04/04/24 0416 04/04/24 1000  BP: (!) 146/93 (!) 179/94 (!) 142/61 (!) 155/78  Pulse: 70 68 (!) 54 66  Resp:  18 17   Temp: 98.9 F (37.2 C) 98.4 F (36.9 C) 98.3 F (36.8 C) 97.7 F (36.5 C)  TempSrc: Oral   Oral  SpO2: 98% 99% 97% 98%  Weight:      Height:        General: Pt is alert, awake, not in acute distress Cardiovascular: RRR, S1/S2 +, no rubs, no gallops Respiratory: CTA bilaterally, no wheezing, no rhonchi Abdominal: Soft, NT, ND, bowel sounds + Extremities: no edema, no cyanosis    The results of significant diagnostics from this hospitalization (including imaging, microbiology, ancillary and laboratory) are listed below for reference.     Microbiology: Recent Results (from the past 240 hours)  Urine Culture (for pregnant, neutropenic or urologic patients or patients with an indwelling urinary catheter)     Status: Abnormal   Collection Time: 04/01/24  5:04 PM   Specimen: Urine, Clean Catch  Result Value Ref Range Status   Specimen Description   Final    URINE, CLEAN CATCH Performed at [facility], [address] [place], [place] [address]    Special Requests   Final    NONE Performed at [facility], [address], [place], [place] [address]    Culture (A)  Final    >=100,000 COLONIES/mL MULTIPLE SPECIES PRESENT, SUGGEST RECOLLECTION   Report Status 04/03/2024 FINAL  Final      Labs: BNP (last 3 results) Recent Labs    02/15/24 0503 03/12/24 0311  BNP 633.1* 130.7*   Basic Metabolic Panel: Recent Labs  Lab 04/01/24 0033 04/01/24 0433 04/03/24 1126  NA 138  --  137  K 4.6  --  3.7  CL 108  --  103  CO2 21*  --  22  GLUCOSE 98  --  116*  BUN 26*  --  36*  CREATININE 1.07* 1.13* 0.82  CALCIUM  9.0  --  9.1   Liver Function Tests: Recent Labs  Lab 04/01/24 0033  AST 34  ALT 33  ALKPHOS 179*  BILITOT 0.4  PROT 6.1*  ALBUMIN 3.5   Recent Labs  Lab 04/01/24 0033  LIPASE 40   No results for input(s): AMMONIA in the last 168 hours. CBC: Recent Labs  Lab 04/01/24 0033 04/01/24 0433 04/03/24 1126  WBC 3.8* 3.4* 5.0  NEUTROABS 2.8  --  4.2  HGB 10.1* 9.4* 10.4*  HCT 34.3* 32.0* 36.1  MCV 88.9 89.9 90.5  PLT 118* 116* 158   Cardiac Enzymes: No results for input(s): CKTOTAL, CKMB, CKMBINDEX, TROPONINI in the last 168 hours. BNP: Invalid input(s): POCBNP CBG: No results for input(s): GLUCAP in the last 168 hours. D-Dimer No results for input(s): DDIMER in the last 72 hours. Hgb A1c No results for input(s): HGBA1C in the  last 72 hours. Lipid Profile No results for input(s): CHOL, HDL, LDLCALC, TRIG, CHOLHDL, LDLDIRECT in the last 72 hours. Thyroid  function studies No results for input(s): TSH, T4TOTAL, T3FREE, THYROIDAB in the last 72 hours.  Invalid input(s): FREET3 Anemia work up No results for input(s): VITAMINB12, FOLATE, FERRITIN, TIBC, IRON , RETICCTPCT in the last 72 hours. Urinalysis    Component Value Date/Time   COLORURINE AMBER (A) 04/01/2024 1047   APPEARANCEUR CLOUDY (A) 04/01/2024 1047   LABSPEC >1.046 (H) 04/01/2024 1047   PHURINE 5.0 04/01/2024 1047   GLUCOSEU NEGATIVE 04/01/2024 1047   HGBUR NEGATIVE 04/01/2024 1047   BILIRUBINUR NEGATIVE 04/01/2024 1047   KETONESUR NEGATIVE 04/01/2024 1047   PROTEINUR 30 (A) 04/01/2024 1047   NITRITE POSITIVE (A)  04/01/2024 1047   LEUKOCYTESUR TRACE (A) 04/01/2024 1047   Sepsis Labs Recent Labs  Lab 04/01/24 0033 04/01/24 0433 04/03/24 1126  WBC 3.8* 3.4* 5.0   Microbiology Recent Results (from the past 240 hours)  Urine Culture (for pregnant, neutropenic or urologic patients or patients with an indwelling urinary catheter)     Status: Abnormal   Collection Time: 04/01/24  5:04 PM   Specimen: Urine, Clean Catch  Result Value Ref Range Status   Specimen Description   Final    URINE, CLEAN CATCH Performed at [facility], [address], [place], [place] [address]    Special Requests   Final    NONE Performed at [facility], [address], [place], [place] [address]    Culture (A)  Final    >=100,000 COLONIES/mL MULTIPLE SPECIES PRESENT, SUGGEST RECOLLECTION   Report Status 04/03/2024 FINAL  Final    FURTHER DISCHARGE INSTRUCTIONS:   Get Medicines reviewed and adjusted: Please take all your medications with you for your next visit with your Primary MD   Laboratory/radiological data: Please request your Primary MD to go over all hospital tests and procedure/radiological results at the follow up, please ask your Primary MD to get all Hospital records sent to his/her office.   In some cases, they will be blood work, cultures and biopsy results pending at the time of your discharge. Please request that your primary care M.D. goes through all the records of your hospital data and follows up on these results.   Also Note the following: If you experience worsening of your admission symptoms, develop shortness of breath, life threatening emergency, suicidal or homicidal thoughts you must seek medical attention immediately by calling 911 or calling your MD immediately  if symptoms less severe.   You must read complete instructions/literature along with all the possible adverse reactions/side effects for all the Medicines you take and that have been  prescribed to you. Take any new Medicines after you have completely understood and accpet all the possible adverse reactions/side effects.    patient was instructed, not to drive, operate heavy machinery, perform activities at heights, swimming or participation in water activities or provide baby-sitting services while on Pain, Sleep and Anxiety Medications; until their outpatient Physician has advised to do so again. Also recommended to not to take more than prescribed Pain, Sleep and Anxiety Medications.  It is not advisable to combine anxiety, sleep and pain medications without talking with your primary care provider.     Wear Seat belts while driving.   Please note: You were cared for by a hospitalist during your hospital stay. Once you are discharged, your primary care physician will handle any further medical issues. Please note that NO  REFILLS for any discharge medications will be authorized once you are discharged, as it is imperative that you return to your primary care physician (or establish a relationship with a primary care physician if you do not have one) for your post hospital discharge needs so that they can reassess your need for medications and monitor your lab values  Time coordinating discharge: Over 30 minutes  SIGNED:   Fredia Skeeter, MD  [facility] 04/04/2024, 10:31 AM *Please note that this is a verbal dictation therefore any spelling or grammatical errors are due to the Dragon [employer] One system interpretation. If 7PM-7AM, please contact night-coverage [email or web address]

## 2024-04-05 ENCOUNTER — Other Ambulatory Visit (HOSPITAL_COMMUNITY): Payer: Self-pay

## 2024-04-05 ENCOUNTER — Telehealth: Payer: Self-pay | Admitting: *Deleted

## 2024-04-05 DIAGNOSIS — S32021D Stable burst fracture of second lumbar vertebra, subsequent encounter for fracture with routine healing: Secondary | ICD-10-CM | POA: Diagnosis not present

## 2024-04-05 NOTE — Transitions of Care (Post Inpatient/ED Visit) (Signed)
 04/05/2024  Name: Peggy Armstrong MRN: [id number] DOB: [birth year]  Today's TOC FU Call Status: Today's TOC FU Call Status:: Successful TOC FU Call Completed TOC FU Call Complete Date: 04/05/24 Patient's Name and Date of Birth confirmed.  Transition Care Management Follow-up Telephone Call Date of Discharge: 04/04/24 Discharge Facility: [facility]) Type of Discharge: Inpatient Admission Primary Inpatient Discharge Diagnosis:: Intractable back pain How have you been since you were released from the hospital?: Better Any questions or concerns?: No  Items Reviewed: Did you receive and understand the discharge instructions provided?: Yes Medications obtained,verified, and reconciled?: Yes (Medications Reviewed) Any new allergies since your discharge?: No Dietary orders reviewed?: Yes Type of Diet Ordered:: low sodium, heart healthy Do you have support at home?: Yes People in Home [RPT]: child(ren), adult Name of Support/Comfort Primary Source: Dtr/Cindy  Medications Reviewed Today: Medications Reviewed Today     Reviewed by Lucky Andrea LABOR, RN (Registered Nurse) on 04/05/24 at 1010  Med List Status: <None>   Medication Order Taking? Sig Documenting Provider Last Dose Status Informant  acetaminophen  (TYLENOL ) 500 MG tablet [id number] Yes Take 2 tablets (1,000 mg total) by mouth every 8 (eight) hours. Verdene Purchase, MD  Active Nursing Home Medication Administration Guide (MAG), Pharmacy Records  albuterol  (VENTOLIN  HFA) [phone] Base) MCG/ACT inhaler [id number] Yes Inhale 1-2 puffs into the lungs every 6 (six) hours as needed for wheezing or shortness of breath. [provider]  Active Nursing Home Medication Administration Guide (MAG), Pharmacy Records  allopurinol  (ZYLOPRIM ) 300 MG tablet [id number] Yes Take 1 tablet (300 mg total) by mouth daily. Rothfuss, Jacob T, PA-C  Active Nursing Home Medication Administration Guide (MAG), Pharmacy Records  amiodarone  (PACERONE )  200 MG tablet [id number] Yes Take 1 tablet (200 mg total) by mouth 2 (two) times daily. Verdene Purchase, MD  Active Nursing Home Medication Administration Guide (MAG), Pharmacy Records  Ascorbic Acid (VITAMIN C PO) [id number] Yes Take 250 mg by mouth in the morning. [provider]  Active Nursing Home Medication Administration Guide [facility]), Pharmacy Records           Med Note RENNIS, DUROJAHYE' R   Mon Apr 01, 2024  5:30 AM)    atorvastatin  (LIPITOR) 20 MG tablet [id number] Yes Take 1 tablet (20 mg total) by mouth daily. Vannie Reche RAMAN, NP  Active Nursing Home Medication Administration Guide (MAG), Pharmacy Records  bisacodyl  (DULCOLAX) 10 MG suppository [id number]  Place 1 suppository (10 mg total) rectally daily as needed for moderate constipation.  Patient not taking: Reported on 04/05/2024   Krishnan, Gokul, MD  Active Nursing Home Medication Administration Guide (MAG), Pharmacy Records  busPIRone  (BUSPAR ) 10 MG tablet [id number] Yes TAKE 1 TABLET BY MOUTH TWICE A DAY Rothfuss, Jacob T, PA-C  Active Nursing Home Medication Administration Guide (MAG), Pharmacy Records  Calcium  Carb-Cholecalciferol  (CALCIUM  600 + D PO) [id number] Yes Take 1 tablet by mouth in the morning. [provider]  Active Nursing Home Medication Administration Guide (MAG), Pharmacy Records  Cholecalciferol  (VITAMIN D3 PO) [id number] Yes Take 1,000 mcg by mouth in the morning. [provider]  Active Nursing Home Medication Administration Guide [facility]), Pharmacy Records           Med Note RENNIS, DUROJAHYE' R   Mon Apr 01, 2024  5:25 AM)    cyanocobalamin  (VITAMIN B12) 1000 MCG tablet [id number] Yes Take 1,000 mcg by mouth daily. [provider]  Active Nursing Home Medication Administration Guide (MAG), Pharmacy Records  diltiazem  (CARDIZEM  CD) 120  MG 24 hr capsule [id number] Yes Take 1 capsule (120 mg total) by mouth daily. Vernon Ranks, MD  Active   DULoxetine  (CYMBALTA ) 20 MG capsule  [id number] Yes TAKE 1 CAPSULE BY MOUTH EVERY DAY Rothfuss, Jacob T, PA-C  Active Nursing Home Medication Administration Guide (MAG), Pharmacy Records  ferrous sulfate  325 (65 FE) MG tablet [id number]  Take 325 mg by mouth daily with breakfast.  Patient not taking: Reported on 04/05/2024   [provider]  Active Nursing Home Medication Administration Guide (MAG), Pharmacy Records           Med Note (GARNER, TIFFANY L   Wed Nov 15, 2023  2:59 PM)    ferumoxytol  (FERAHEME ) 510 mg in sodium chloride  0.9 % 100 mL IVPB [id number]   Von Bellis, MD  Active   folic acid  (FOLVITE ) 1 MG tablet [id number] Yes Take 1 tablet (1 mg total) by mouth daily. Verdene Purchase, MD  Active Nursing Home Medication Administration Guide (MAG), Pharmacy Records  HYDROmorphone  (DILAUDID ) 2 MG tablet [id number]  Take 1 tablet (2 mg total) by mouth every 4 (four) hours as needed for severe pain (pain score 7-10).  Patient not taking: Reported on 04/05/2024   Krishnan, Gokul, MD  Active Nursing Home Medication Administration Guide (MAG), Pharmacy Records  ipratropium-albuterol  (DUONEB) 0.5-2.5 (3) MG/3ML SOLN [id number] Yes Take 3 mLs by nebulization every 6 (six) hours as needed (for shortness of breath or wheezing). [provider]  Active Nursing Home Medication Administration Guide (MAG), Pharmacy Records  KLOR-CON  M20 20 MEQ tablet [id number] Yes Take 20 mEq by mouth in the morning and at bedtime. [provider]  Active Nursing Home Medication Administration Guide (MAG), Pharmacy Records  levothyroxine  (SYNTHROID ) 100 MCG tablet [id number] Yes Take 1 tablet (100 mcg total) by mouth daily before breakfast. Rothfuss, Jacob T, PA-C  Active Nursing Home Medication Administration Guide (MAG), Pharmacy Records  lidocaine  (LIDODERM ) 5 % [id number]  Place 1 patch onto the skin daily. Remove & Discard patch within 12 hours or as directed by MD  Patient not taking: Reported on 04/05/2024   Krishnan, Gokul, MD   Active Nursing Home Medication Administration Guide (MAG), Pharmacy Records  melatonin 3 MG TABS tablet [id number] Yes Take 1 tablet (3 mg total) by mouth at bedtime as needed (insomnia).  Patient taking differently: Take 3 mg by mouth at bedtime.   Verdene Purchase, MD  Active Nursing Home Medication Administration Guide (MAG), Pharmacy Records  methocarbamol  (ROBAXIN ) 500 MG tablet [id number] Yes TAKE 1 TABLET BY MOUTH EVERY 6 HOURS AS NEEDED FOR MUSCLE SPASMS. Rothfuss, Jacob T, PA-C  Active Nursing Home Medication Administration Guide (MAG), Pharmacy Records  ondansetron  (ZOFRAN ) 4 MG tablet [id number] Yes Take 4 mg by mouth every 6 (six) hours as needed for nausea or vomiting. [provider]  Active Nursing Home Medication Administration Guide [facility]), Pharmacy Records           Med Note CLAUD, ABBIE R   Thu Aug 24, 2023  2:37 PM)    pantoprazole  (PROTONIX ) 40 MG tablet [id number] Yes Take 1 tablet (40 mg total) by mouth 2 (two) times daily. Verdene Purchase, MD  Active Nursing Home Medication Administration Guide (MAG), Pharmacy Records  polyethylene glycol (MIRALAX  / GLYCOLAX ) 17 g packet [id number] Yes Take 17 g by mouth daily.  Patient taking differently: Take 17 g by mouth daily as needed.   Verdene Purchase, MD  Active Nursing Home Medication Administration Guide (MAG), Pharmacy Records  Potassium Chloride  ER 20 MEQ  TBCR [id number] Yes Take 1 tablet by mouth daily. [provider]  Active Nursing Home Medication Administration Guide (MAG), Pharmacy Records  predniSONE  (DELTASONE ) 10 MG tablet [id number] Yes Take 4 tablets (40 mg total) by mouth daily for 3 days, THEN 3 tablets (30 mg total) daily for 3 days, THEN 2 tablets (20 mg total) daily for 3 days, THEN 1 tablet (10 mg total) daily for 3 days. Vernon Ranks, MD  Active   senna-docusate (SENOKOT-S) 8.6-50 MG tablet [id number]  Take 2 tablets by mouth 2 (two) times daily.  Patient not taking: Reported on 04/05/2024   Krishnan,  Gokul, MD  Active Nursing Home Medication Administration Guide (MAG), Pharmacy Records  sertraline  (ZOLOFT ) 25 MG tablet [id number] Yes Take 25 mg by mouth daily. [provider]  Active Nursing Home Medication Administration Guide [facility]), Pharmacy Records  Sodium Phosphates  [facility]) CRETA [id number]  Place 1 Insert rectally as needed (Constipation).  Patient not taking: Reported on 04/05/2024   [provider]  Active Nursing Home Medication Administration Guide (MAG), Pharmacy Records  spironolactone  (ALDACTONE ) 25 MG tablet [id number] Yes Take 0.5 tablets (12.5 mg total) by mouth daily. Verdene Purchase, MD  Active Nursing Home Medication Administration Guide (MAG), Pharmacy Records  sucralfate  (CARAFATE ) 1 g tablet [id number] Yes Take 1 tablet (1 g total) by mouth 2 (two) times daily. Verdene Purchase, MD  Active Nursing Home Medication Administration Guide (MAG), Pharmacy Records  TIADYLT  ER 300 MG 24 hr capsule [id number]  Take 300 mg by mouth See admin instructions. Give 1 capsule by mouth in the morning for atrial flutter IF SYSTOLIC IS LESS THAN 120 OR HEART LESS THAN 60  Patient not taking: Reported on 04/05/2024   [provider]  Active Nursing Home Medication Administration Guide (MAG), Pharmacy Records            Home Care and Equipment/Supplies: Were Home Health Services Ordered?: Yes Name of Home Health Agency:: [facility]  [phone] Has Agency set up a time to come to your home?: No (RNCM contacted [facility]-they will call Dtr today) EMR reviewed for Home Health Orders: Orders present/patient has not received call (refer to CM for follow-up) Any new equipment or medical supplies ordered?: No  Functional Questionnaire: Do you need assistance with bathing/showering or dressing?: No Do you need assistance with meal preparation?: Yes (Dtr assists) Do you need assistance with eating?: No Do you have difficulty maintaining continence: No Do you need  assistance with getting out of bed/getting out of a chair/moving?: No Do you have difficulty managing or taking your medications?: Yes (Dtr assists)  Follow up appointments reviewed: PCP Follow-up appointment confirmed?: Yes Date of PCP follow-up appointment?: 04/11/24 Follow-up Provider: Dr. Iven Specialist [facility] Follow-up appointment confirmed?: No Reason Specialist Follow-Up Not Confirmed: Patient has Specialist Provider Number and will Call for Appointment Do you need transportation to your follow-up appointment?: No Do you understand care options if your condition(s) worsen?: Yes-patient verbalized understanding  SDOH Interventions Today    Flowsheet Row Most Recent Value  SDOH Interventions   Food Insecurity Interventions Intervention Not Indicated  Housing Interventions Intervention Not Indicated  Transportation Interventions Intervention Not Indicated  Utilities Interventions Intervention Not Indicated    Goals Addressed             This Visit's Progress    VBCI Transitions of Care (TOC) Care Plan       Problems:  Recent Hospitalization for treatment of Back pain related to spinal stenosis Medication management barrier -daughter is going  through cancer treatment and having difficulty managing her mother's medications  Goal:  Over the next 30 days, the patient will not experience hospital readmission  Interventions:  Transitions of Care: Durable Medical Equipment (DME) reviewed with patient/caregiver Doctor Visits  - discussed the importance of doctor visits Contacted Health RN/OT/PT - RNCM contacted [facility] [phone], request they call call Cindy/Dtr for scheduling SOC, [facility] will call today Post discharge activity limitations prescribed by provider reviewed Discussed compliance packaging via [facility] in [place] [phone] plans to transfer medications and request compliance packaging Provided information for [facility]  [phone] and [facility] [phone], advised calling for transportation options to alleviate stress on patient's dtr Advised contacting Neurosurgeon to schedule follow up Medications reviewed discussed prednisone  taper in detail Discussed diet and exercise for managing HTN Discussed patient is planning to purchase a new BP monitor  Patient Self Care Activities:  Attend all scheduled provider appointments Call pharmacy for medication refills 3-7 days in advance of running out of medications Call provider office for new concerns or questions  Notify RN Care Manager of TOC call rescheduling needs Participate in Transition of Care Program/Attend TOC scheduled calls Take medications as prescribed   check blood pressure daily take medications for blood pressure exactly as prescribed eat more whole grains, fruits and vegetables, lean meats and healthy fats  Plan:  Telephone follow up appointment with care management team member scheduled for:  04/12/24 at 1pm with Laine, RN        Andrea Dimes RN, BSN [place]  [facility] RN Care Manager [phone]

## 2024-04-06 ENCOUNTER — Other Ambulatory Visit (HOSPITAL_COMMUNITY): Payer: Self-pay

## 2024-04-06 MED ORDER — DILTIAZEM HCL ER 120 MG PO CP12
120.0000 mg | ORAL_CAPSULE | Freq: Every day | ORAL | 0 refills | Status: DC
  Filled 2024-04-06: qty 30, 30d supply, fill #0

## 2024-04-06 MED ORDER — TORSEMIDE 20 MG PO TABS
20.0000 mg | ORAL_TABLET | Freq: Every day | ORAL | 0 refills | Status: DC
  Filled 2024-04-06: qty 30, 30d supply, fill #0

## 2024-04-08 ENCOUNTER — Other Ambulatory Visit (HOSPITAL_COMMUNITY): Payer: Self-pay

## 2024-04-08 ENCOUNTER — Other Ambulatory Visit: Payer: Self-pay

## 2024-04-08 DIAGNOSIS — D509 Iron deficiency anemia, unspecified: Secondary | ICD-10-CM | POA: Diagnosis not present

## 2024-04-08 DIAGNOSIS — M48061 Spinal stenosis, lumbar region without neurogenic claudication: Secondary | ICD-10-CM | POA: Diagnosis not present

## 2024-04-08 DIAGNOSIS — I714 Abdominal aortic aneurysm, without rupture, unspecified: Secondary | ICD-10-CM | POA: Diagnosis not present

## 2024-04-08 DIAGNOSIS — R1310 Dysphagia, unspecified: Secondary | ICD-10-CM | POA: Diagnosis not present

## 2024-04-08 DIAGNOSIS — G2581 Restless legs syndrome: Secondary | ICD-10-CM | POA: Diagnosis not present

## 2024-04-08 DIAGNOSIS — I130 Hypertensive heart and chronic kidney disease with heart failure and stage 1 through stage 4 chronic kidney disease, or unspecified chronic kidney disease: Secondary | ICD-10-CM | POA: Diagnosis not present

## 2024-04-08 DIAGNOSIS — M109 Gout, unspecified: Secondary | ICD-10-CM | POA: Diagnosis not present

## 2024-04-08 DIAGNOSIS — N1832 Chronic kidney disease, stage 3b: Secondary | ICD-10-CM | POA: Diagnosis not present

## 2024-04-08 DIAGNOSIS — I5032 Chronic diastolic (congestive) heart failure: Secondary | ICD-10-CM | POA: Diagnosis not present

## 2024-04-08 DIAGNOSIS — Z9049 Acquired absence of other specified parts of digestive tract: Secondary | ICD-10-CM | POA: Diagnosis not present

## 2024-04-08 DIAGNOSIS — I480 Paroxysmal atrial fibrillation: Secondary | ICD-10-CM | POA: Diagnosis not present

## 2024-04-08 DIAGNOSIS — E039 Hypothyroidism, unspecified: Secondary | ICD-10-CM | POA: Diagnosis not present

## 2024-04-08 DIAGNOSIS — M069 Rheumatoid arthritis, unspecified: Secondary | ICD-10-CM | POA: Diagnosis not present

## 2024-04-08 DIAGNOSIS — I484 Atypical atrial flutter: Secondary | ICD-10-CM | POA: Diagnosis not present

## 2024-04-08 DIAGNOSIS — M5416 Radiculopathy, lumbar region: Secondary | ICD-10-CM | POA: Diagnosis not present

## 2024-04-08 DIAGNOSIS — Z86718 Personal history of other venous thrombosis and embolism: Secondary | ICD-10-CM | POA: Diagnosis not present

## 2024-04-08 DIAGNOSIS — C170 Malignant neoplasm of duodenum: Secondary | ICD-10-CM | POA: Diagnosis not present

## 2024-04-08 DIAGNOSIS — Z87440 Personal history of urinary (tract) infections: Secondary | ICD-10-CM | POA: Diagnosis not present

## 2024-04-08 DIAGNOSIS — E785 Hyperlipidemia, unspecified: Secondary | ICD-10-CM | POA: Diagnosis not present

## 2024-04-09 ENCOUNTER — Encounter: Payer: Self-pay | Admitting: Pharmacist

## 2024-04-09 ENCOUNTER — Other Ambulatory Visit: Payer: Self-pay

## 2024-04-09 ENCOUNTER — Ambulatory Visit

## 2024-04-09 ENCOUNTER — Other Ambulatory Visit (HOSPITAL_COMMUNITY): Payer: Self-pay

## 2024-04-11 ENCOUNTER — Encounter (HOSPITAL_BASED_OUTPATIENT_CLINIC_OR_DEPARTMENT_OTHER): Payer: Self-pay | Admitting: Student

## 2024-04-11 ENCOUNTER — Other Ambulatory Visit (HOSPITAL_BASED_OUTPATIENT_CLINIC_OR_DEPARTMENT_OTHER): Payer: Self-pay | Admitting: Student

## 2024-04-11 ENCOUNTER — Other Ambulatory Visit (HOSPITAL_BASED_OUTPATIENT_CLINIC_OR_DEPARTMENT_OTHER): Payer: Self-pay

## 2024-04-11 ENCOUNTER — Ambulatory Visit (INDEPENDENT_AMBULATORY_CARE_PROVIDER_SITE_OTHER): Admitting: Student

## 2024-04-11 VITALS — BP 125/79 | HR 58 | Temp 98.2°F | Resp 16 | Ht 60.0 in | Wt 164.2 lb

## 2024-04-11 DIAGNOSIS — I5032 Chronic diastolic (congestive) heart failure: Secondary | ICD-10-CM

## 2024-04-11 DIAGNOSIS — E039 Hypothyroidism, unspecified: Secondary | ICD-10-CM

## 2024-04-11 DIAGNOSIS — C170 Malignant neoplasm of duodenum: Secondary | ICD-10-CM | POA: Diagnosis not present

## 2024-04-11 DIAGNOSIS — R52 Pain, unspecified: Secondary | ICD-10-CM | POA: Diagnosis not present

## 2024-04-11 DIAGNOSIS — D500 Iron deficiency anemia secondary to blood loss (chronic): Secondary | ICD-10-CM | POA: Diagnosis not present

## 2024-04-11 DIAGNOSIS — E785 Hyperlipidemia, unspecified: Secondary | ICD-10-CM

## 2024-04-11 DIAGNOSIS — Z09 Encounter for follow-up examination after completed treatment for conditions other than malignant neoplasm: Secondary | ICD-10-CM

## 2024-04-11 DIAGNOSIS — K219 Gastro-esophageal reflux disease without esophagitis: Secondary | ICD-10-CM | POA: Diagnosis not present

## 2024-04-11 DIAGNOSIS — M1A9XX0 Chronic gout, unspecified, without tophus (tophi): Secondary | ICD-10-CM

## 2024-04-11 MED ORDER — TRAMADOL HCL 50 MG PO TABS
50.0000 mg | ORAL_TABLET | Freq: Three times a day (TID) | ORAL | 0 refills | Status: AC | PRN
  Filled 2024-04-11: qty 30, 10d supply, fill #0

## 2024-04-11 NOTE — Patient Instructions (Addendum)
 It was nice to see you today!  Please get in contact with your oncologist and your cardiologist for a follow up appointment on both your pain and your cardiac medications.  If you have any problems before your next visit feel free to message me via MyChart (minor issues or questions) or call the office, otherwise you may reach out to schedule an office visit.  Thank you! Kennley Schwandt, PA-C

## 2024-04-11 NOTE — Telephone Encounter (Signed)
 Copied from CRM [id number]. Topic: Clinical - Medication Refill >> Apr 11, 2024  5:19 PM Santiya F wrote: Medication: allopurinol  (ZYLOPRIM ) 300 MG tablet [[id number]]  Has the patient contacted their pharmacy? Yes  (Agent: If yes, when and what did the pharmacy advise?) contact office   This is the patient's preferred pharmacy:  [facility] [address] [place] [address] Phone: [phone] Fax: [phone]  Is this the correct pharmacy for this prescription? Yes If no, delete pharmacy and type the correct one.   Has the prescription been filled recently? Yes  Is the patient out of the medication? Yes  Has the patient been seen for an appointment in the last year OR does the patient have an upcoming appointment? Yes  Can we respond through MyChart? No  Agent: Please be advised that Rx refills may take up to 3 business days. We ask that you follow-up with your pharmacy.

## 2024-04-11 NOTE — Progress Notes (Signed)
 Established Patient Office Visit  Subjective   Patient ID: Peggy Armstrong, female    DOB: April 18, 1946  Age: 78 y.o. MRN: [id number]  Chief Complaint  Patient presents with   Hospitalization Follow-up    [facility] 03/31/2024 - 04/04/2024 (4 days). She needs PT orders for [facility].    HPI  Discussed the use of AI scribe software for clinical note transcription with the patient, who gave verbal consent to proceed.  History of Present Illness   Peggy Armstrong is a 78 year old female who presents with left-sided back pain and medication management issues.  She experiences significant left-sided back pain, described as severe enough to cause sweating and a 'funny' feeling. The pain sometimes leads to sweating and feeling faint, which she attributes to the pain. She has been using physical therapy techniques such as 'long rolls' and turning from side to side to manage her symptoms. She experiences pain during transfers from her wheelchair to the bed and vice versa, and sometimes when using the bathroom. She has a history of a vertebrae issue, specifically the L2 vertebrae, which has been described as 'a mess.'  She has been experiencing issues with her medication regimen following a recent hospital discharge. She was discharged with prednisone  and a prescription for green capsules, which have been effective. There is confusion regarding the use of Eliquis , which she has not taken for a couple of months, and she is unsure if it should be restarted. She also mentions amiodarone , which she has been taking once daily, although her medication list suggests twice daily. She has not been taking torsemide , a fluid pill, as it was not on her discharge list, and she is unsure if it should be continued. Additionally, she has been taking one heartburn pill daily but questions if she can increase it to two per day.  She has a history of anxiety and pain for which she takes duloxetine . She also  uses Tylenol  and a muscle relaxer for pain management. She previously tried tramadol  for pain, which was not effective. She is currently not taking diclofenac due to concerns about abdominal bleeding. She is also on levothyroxine  for thyroid  management and atorvastatin  for cholesterol. She was given prednisone  upon discharge from [facility], which she is tapering off.  She is scheduled to follow up with her oncologist, Dr. Ezzard, and a neurosurgeon, although she misplaced the neurosurgeon's contact information. She also mentions needing to follow up with cardiology regarding her amiodarone  dosage.       Patient Active Problem List   Diagnosis Date Noted   Intractable back pain 04/01/2024   Pain 02/21/2024   Constipation 02/21/2024   Medication management 02/21/2024   Goals of care, counseling/discussion 02/20/2024   Palliative care encounter 02/20/2024   Anemia due to blood loss, acute 02/15/2024   IDA (iron  deficiency anemia) 02/15/2024   Anemia 02/14/2024   Mucosal abnormality of esophagus 02/14/2024   Chronic anticoagulation 02/12/2024   Acute on chronic anemia 02/12/2024   Severe anemia 02/10/2024   Closed compression fracture of body of L1 vertebra (HCC) 02/10/2024   Lumbar radiculopathy 12/15/2023   Acquired hypothyroidism    Avulsion injury 10/25/2023   Disorder of bone 10/25/2023   Displacement of bone 10/25/2023   Obesity with body mass index 30 or greater 10/25/2023   Fall at home, initial encounter 10/21/2023   Left humeral fracture 10/21/2023   Left elbow avulsion fracture 10/21/2023   History of GI bleed 10/21/2023  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   History of depression 10/21/2023   Atrial fibrillation (HCC)    Back injury    DVT (deep venous thrombosis) (HCC)    Hypertension    Cellulitis of left arm 08/14/2023   Hemorrhagic shock (HCC) 07/21/2023   ABLA (acute blood loss anemia) 07/21/2023   Acute GI bleeding 07/21/2023   AKI (acute kidney  injury) 07/21/2023   GI bleed 07/20/2023   Chronic low back pain 07/18/2023   Upper GI bleed 06/20/2023   Long term current use of anticoagulant therapy 06/20/2023   Reduced mobility 06/20/2023   Need for assistance with personal care 05/21/2023   Unsteadiness on feet 05/21/2023   Difficulty walking 05/19/2023   Dysphagia 05/19/2023   Muscle weakness 05/19/2023   Symbolic dysfunction 05/19/2023   Congestive heart failure (HCC) 05/18/2023   Depressive disorder 05/18/2023   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   Occult blood in stools 05/16/2023   Gastric polyps 05/16/2023   Generalized weakness 05/12/2023   Acute cystitis 05/12/2023   Dehydration 05/12/2023   Acute prerenal azotemia 05/12/2023   Paroxysmal atrial fibrillation (HCC) 05/12/2023   AAA (abdominal aortic aneurysm) 05/12/2023   Hyperlipidemia 05/12/2023   GAD (generalized anxiety disorder) 05/12/2023   Hypothyroidism 05/12/2023   Closed compression fracture of L2 lumbar vertebra, initial encounter (HCC) 05/11/2023   Restless leg 12/02/2022   Routine general medical examination at a health care facility 12/13/2019   Chronic diastolic CHF (congestive heart failure) (HCC) 11/08/2019   Morbid obesity (HCC) 11/08/2019   History of DVT (deep vein thrombosis) 11/08/2019   Need for immunization against influenza 07/18/2019   Right shoulder pain 07/26/2018   Need for vaccination 04/23/2018   Duodenal ulcer    Hematemesis 11/05/2017   Macrocytic anemia 11/05/2017   Melena 11/05/2017   Nausea & vomiting    Hypokalemia 07/03/2017   Essential hypertension    Gout    Clotting disorder    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   CKD (chronic kidney disease) stage 3, GFR 30-59 ml/min (HCC) 06/09/2017   CKD (chronic kidney disease), stage III (HCC) 06/09/2017   S/P ERCP 05/05/2017   Peri-ampullary neoplasm    Atypical atrial flutter (HCC)    Duodenal mass    Cholangitis (HCC)    Epigastric pain     Choledocholithiasis 03/22/2017   SIRS (systemic inflammatory response syndrome) (HCC) 03/22/2017   Poor dentition 08/14/2016   Iron  deficiency anemia 08/02/2016   RA (rheumatoid arthritis) (HCC) 09/07/2015   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   Cellulitis of left arm 08/14/2023   Cholangitis (HCC)    Choledocholithiasis 03/22/2017   Chronic anticoagulation 02/12/2024   Chronic diastolic CHF (congestive heart failure) (HCC) 11/08/2019   Chronic low back pain 07/18/2023   CKD (chronic kidney disease) stage 3, GFR 30-59 ml/min (HCC) 06/09/2017   CKD (chronic kidney disease), stage III (HCC) 06/09/2017   Closed compression fracture of body of  L1 vertebra (HCC) 02/10/2024   Closed compression fracture of L2 lumbar vertebra, initial encounter (HCC) 05/11/2023   Clotting disorder    right DVT     Congestive heart failure (HCC) 05/18/2023   Constipation 02/21/2024   Dehydration 05/12/2023   Depressive disorder 05/18/2023   Difficulty walking 05/19/2023   Disorder of bone 10/25/2023   Displacement of bone 10/25/2023   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   Duodenal mass    Duodenal ulcer    DVT (deep venous thrombosis) (HCC)    right DVT   Dysphagia 05/19/2023   Epigastric pain    Essential hypertension    Fall at home, initial encounter 10/21/2023   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   GAD (generalized anxiety disorder)    Gastric polyps 05/16/2023   Generalized  weakness 05/12/2023   GI bleed 07/20/2023   Goals of care, counseling/discussion 02/20/2024   Gout    Gout    Hematemesis 11/05/2017   Hemorrhagic shock (HCC) 07/21/2023   History of depression 10/21/2023   History of DVT (deep vein thrombosis) 11/08/2019   History of GI bleed 10/21/2023   Hyperlipidemia 05/12/2023   Hypertension    Hypokalemia 07/03/2017   Hypothyroidism 05/12/2023   IDA (iron  deficiency anemia) 02/15/2024   Iron  deficiency anemia 08/02/2016   Left elbow avulsion fracture 10/21/2023   Left humeral fracture 10/21/2023   Long term current use of anticoagulant therapy 06/20/2023   Lumbar radiculopathy 12/15/2023   Macrocytic anemia 11/05/2017   Medication management 02/21/2024   Melena 11/05/2017   Morbid obesity (HCC) 11/08/2019   Mucosal abnormality of esophagus 02/14/2024   Muscle weakness 05/19/2023   Nausea & vomiting    Need for assistance with personal care 05/21/2023   Need for immunization against influenza 07/18/2019   Need for vaccination 04/23/2018   Obesity with body mass index 30 or greater 10/25/2023   Occult blood in stools 05/16/2023   Pain 02/21/2024   Palliative care encounter 02/20/2024   Paroxysmal atrial fibrillation (HCC) 05/12/2023   Peri-ampullary neoplasm    Poor dentition 08/14/2016   RA (rheumatoid arthritis) (HCC)    Reduced mobility 06/20/2023   Restless leg 12/02/2022   Right shoulder pain 07/26/2018   Routine general medical examination at a health care facility 12/13/2019   S/P ERCP 05/05/2017   Severe anemia 02/10/2024   SIRS (systemic inflammatory response syndrome) (HCC) 03/22/2017   Symbolic dysfunction 05/19/2023   Unsteadiness on feet 05/21/2023   Upper GI bleed 06/20/2023   Social History   Tobacco Use   Smoking status: Former    Current packs/day: 0.00    Average packs/day: 1.5 packs/day for 44.0 years (66.0 ttl pk-yrs)    Types: Cigarettes    Start date: 1968    Quit date: 2012    Years since quitting:  13.8    Passive exposure: Past   Smokeless tobacco: Former   Tobacco comments:    Quit in 2012. Started at 20's. Cannot recall how many.   Vaping Use   Vaping status: Never Used  Substance Use Topics   Alcohol use: No   Drug use: No   Allergies  Allergen Reactions   Codeine Nausea And Vomiting and Other (See Comments)    Made me very sick   Tape Other (See Comments)    Tears skin, as it is VERY THIN!!  - surgical tape   Oxycodone  Other (See Comments)    Hallucinations       ROS Per HPI.    Objective:  BP 125/79   Pulse (!) 58   Temp 98.2 F (36.8 C) (Oral)   Resp 16   Ht 5' (1.524 m)   Wt 164 lb 3.2 oz (74.5 kg)   SpO2 96%   BMI 32.07 kg/m     Physical Exam Constitutional:      General: She is not in acute distress.    Appearance: Normal appearance. She is not ill-appearing.  HENT:     Head: Normocephalic and atraumatic.     Nose: Nose normal.  Eyes:     General: No scleral icterus.    Conjunctiva/sclera: Conjunctivae normal.  Cardiovascular:     Rate and Rhythm: Normal rate and regular rhythm.     Heart sounds: Normal heart sounds. No murmur heard.    No friction rub.  Pulmonary:     Effort: Pulmonary effort is normal. No respiratory distress.     Breath sounds: Normal breath sounds. No wheezing, rhonchi or rales.  Musculoskeletal:        General: Normal range of motion.     Right lower leg: No edema.     Left lower leg: No edema.  Skin:    General: Skin is warm and dry.     Coloration: Skin is not jaundiced or pale.  Neurological:     General: No focal deficit present.     Mental Status: She is alert.  Psychiatric:        Mood and Affect: Mood normal.        Behavior: Behavior normal.      No results found for any visits on 04/11/24.  Last CBC Lab Results  Component Value Date   WBC 5.0 04/03/2024   HGB 10.4 (L) 04/03/2024   HCT 36.1 04/03/2024   MCV 90.5 04/03/2024   MCH 26.1 04/03/2024   RDW 16.1 (H) 04/03/2024   PLT 158  04/03/2024   Last metabolic panel Lab Results  Component Value Date   GLUCOSE 116 (H) 04/03/2024   NA 137 04/03/2024   K 3.7 04/03/2024   CL 103 04/03/2024   CO2 22 04/03/2024   BUN 36 (H) 04/03/2024   CREATININE 0.82 04/03/2024   GFRNONAA >60 04/03/2024   CALCIUM  9.1 04/03/2024   PHOS 3.4 02/18/2024   PROT 6.1 (L) 04/01/2024   ALBUMIN 3.5 04/01/2024   LABGLOB 2.4 07/18/2023   BILITOT 0.4 04/01/2024   ALKPHOS 179 (H) 04/01/2024   AST 34 04/01/2024   ALT 33 04/01/2024   ANIONGAP 12 04/03/2024   Last lipids Lab Results  Component Value Date   CHOL 127 07/18/2023   HDL 25 (L) 07/18/2023   LDLCALC 80 07/18/2023   TRIG 122 07/18/2023   CHOLHDL 5.1 (H) 07/18/2023   Last hemoglobin A1c Lab Results  Component Value Date   HGBA1C 4.3 (L) 07/18/2023      The ASCVD Risk score (Arnett DK, et al., 2019) failed to calculate for the following reasons:   The valid total cholesterol range is 130 to 320 mg/dL    Assessment & Plan:   Assessment and Plan    Pain (cancer related versus other)/Current duodenal adenocarcinoma Chronic pain likely related to malignant neoplasm, possibly exacerbated by cancer-related pain? Pain occurs during transfers and is severe, requiring stronger medication. - Prescribed tramadol  50 mg as needed for moderate pain, 30 tablets. - Instructed to discuss with oncologist  Atrial fibrillation Regular pulse today. Management complicated by recent medication changes. Eliquis  was discontinued at the hospital, and there is concern about restarting  due to bleeding risk. Amiodarone  dosing was inconsistent, with current regimen being 200 mg once daily. - Held Eliquis  and instructed to discuss with Dr. Ezzard regarding anticoagulation management. I believe that her bleed risk is too high.  - Continue amiodarone  200 mg once daily as per cardiology instructions. - Monitor for signs of stroke and deep vein thrombosis. - Assess CBC and BMP today  Chronic heart  failure Well-managed fluid status. Torsemide  was previously prescribed but is not currently needed due to stable fluid status. - Held torsemide  and potassium supplementation for now.  - Monitor fluid status and adjust diuretics as needed.  Gastroesophageal reflux disease Chronic, stable. Managed with heartburn medication. Current regimen is one pill per day, but symptoms suggest need for increased dosing. - Increase heartburn medication to two pills per day if needed, one in the morning and one in the evening. - Monitor symptoms and adjust dosage as necessary.  Hypothyroidism Chronic, stable. Managed with levothyroxine . - Continue current levothyroxine  regimen.  Hyperlipidemia Chronic, stable. Managed with atorvastatin . - Continue current atorvastatin  regimen.      I personally spent a total of 32 minutes in the care of the patient today including preparing to see the patient, getting/reviewing separately obtained history, performing a medically appropriate exam/evaluation, counseling and educating, placing orders, and documenting clinical information in the EHR.   Return in about 8 weeks (around 06/06/2024) for Chronic Followup.    Kadir Azucena T Shanica Castellanos, PA-C

## 2024-04-12 ENCOUNTER — Other Ambulatory Visit (HOSPITAL_BASED_OUTPATIENT_CLINIC_OR_DEPARTMENT_OTHER): Payer: Self-pay

## 2024-04-12 ENCOUNTER — Encounter (HOSPITAL_BASED_OUTPATIENT_CLINIC_OR_DEPARTMENT_OTHER): Payer: Self-pay

## 2024-04-12 ENCOUNTER — Other Ambulatory Visit (HOSPITAL_COMMUNITY): Payer: Self-pay

## 2024-04-12 ENCOUNTER — Other Ambulatory Visit: Payer: Self-pay | Admitting: *Deleted

## 2024-04-12 NOTE — Transitions of Care (Post Inpatient/ED Visit) (Signed)
 Transition of Care week 2  Visit Note  04/12/2024  Name: Peggy Armstrong MRN: [id number]          DOB: 1945-09-09  Situation: Patient enrolled in [facility] 30-day program. Visit completed with patient by telephone.   HIPAA identifiers x 2 verified  Background:   Initial Transition Care Management Follow-up Telephone Call Discharge Date and Diagnosis: 04/04/24, Intractable back pain   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   Cellulitis of left arm 08/14/2023   Cholangitis (HCC)    Choledocholithiasis 03/22/2017   Chronic anticoagulation 02/12/2024   Chronic diastolic CHF (congestive heart failure) (HCC) 11/08/2019   Chronic low back pain 07/18/2023   CKD (chronic kidney disease) stage 3, GFR 30-59 ml/min (HCC) 06/09/2017   CKD (chronic kidney disease), stage III (HCC) 06/09/2017   Closed compression fracture of body of L1 vertebra (HCC) 02/10/2024   Closed compression fracture of L2 lumbar vertebra, initial encounter (HCC) 05/11/2023   Clotting disorder    right DVT     Congestive heart failure (HCC) 05/18/2023   Constipation 02/21/2024   Dehydration 05/12/2023   Depressive disorder 05/18/2023   Difficulty walking 05/19/2023   Disorder of bone 10/25/2023   Displacement of bone 10/25/2023   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   Duodenal mass    Duodenal ulcer    DVT (deep venous thrombosis) (HCC)    right DVT   Dysphagia 05/19/2023   Epigastric pain    Essential  hypertension    Fall at home, initial encounter 10/21/2023   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   GAD (generalized anxiety disorder)    Gastric polyps 05/16/2023   Generalized weakness 05/12/2023   GI bleed 07/20/2023   Goals of care, counseling/discussion 02/20/2024   Gout    Gout    Hematemesis 11/05/2017   Hemorrhagic shock (HCC) 07/21/2023   History of depression 10/21/2023   History of DVT (deep vein thrombosis) 11/08/2019   History of GI bleed 10/21/2023   Hyperlipidemia 05/12/2023   Hypertension    Hypokalemia 07/03/2017   Hypothyroidism 05/12/2023   IDA (iron  deficiency anemia) 02/15/2024   Iron  deficiency anemia 08/02/2016   Left elbow avulsion fracture 10/21/2023   Left humeral fracture 10/21/2023   Long term current use of anticoagulant therapy 06/20/2023   Lumbar radiculopathy 12/15/2023   Macrocytic anemia 11/05/2017   Medication management 02/21/2024   Melena 11/05/2017   Morbid obesity (HCC) 11/08/2019   Mucosal abnormality of esophagus 02/14/2024   Muscle weakness 05/19/2023   Nausea & vomiting    Need for assistance with personal care 05/21/2023   Need for immunization against influenza 07/18/2019   Need for vaccination 04/23/2018   Obesity with body mass index 30 or greater 10/25/2023   Occult blood in stools 05/16/2023   Pain 02/21/2024   Palliative care encounter 02/20/2024   Paroxysmal atrial fibrillation (HCC) 05/12/2023   Peri-ampullary neoplasm    Poor dentition 08/14/2016   RA (rheumatoid arthritis) (  HCC)    Reduced mobility 06/20/2023   Restless leg 12/02/2022   Right shoulder pain 07/26/2018   Routine general medical examination at a health care facility 12/13/2019   S/P ERCP 05/05/2017   Severe anemia 02/10/2024   SIRS (systemic inflammatory response syndrome) (HCC) 03/22/2017   Symbolic dysfunction 05/19/2023   Unsteadiness on feet 05/21/2023   Upper GI bleed 06/20/2023   Assessment:  Patient requires frequent re-directing  throughout call and is talking very fast; daughter does not participate in [facility] call:  I am doing much better and had a real good visit with my doctor yesterday; the home health people are coming out to see me, it's going just fine.  Still taking the prednisone - have a few more days left from what my daughter tells me.  I got the new medicine from the doctor yesterday and it is helping the pain.  They told me yesterday that my blood pressure was real good  Denies clinical concerns and sounds to be in no distress throughout [facility] 30-day program outreach call today  Patient Reported Symptoms: Cognitive Cognitive Status: Normal speech and language skills, Alert and oriented to person, place, and time, Insightful and able to interpret abstract concepts, Other: (Very fast in speech/ talking) Cognitive/Intellectual Conditions Management [RPT]: None reported or documented in medical history or problem list      Neurological Neurological Review of Symptoms: No symptoms reported, Other: [place] Neurological Symptoms/Conditions [RPT]: Recent hospital visit for intractable chronic back pain: Reports I think my daughter has scheduled the office visit with the back doctor, but I am not sure; Patient reports today has contact information for neurology provider Neurological Management Strategies: Routine screening, Coping strategies, Adequate rest, Medication therapy  HEENT HEENT Symptoms Reported: No symptoms reported      Cardiovascular Cardiovascular Symptoms Reported: No symptoms reported Is patient checking Blood Pressure at home?: No Patient's Recent BP reading at home: Difficult to follow patient's speech: talking very fast; required much re-directing- refocusing throughout call; despite frequent attempts- patient did not directly answer whether she obtained BP cuff for home monitoring; states they told me yesterday at the doctor office that my blood pressure was good Cardiovascular Management Strategies:  Routine screening, Medication therapy, Adequate rest  Respiratory Respiratory Symptoms Reported: No symptoms reported Other Respiratory Symptoms: Denies shortness of breath/ cough; sounds to be in no respiratory distress throughout [facility] call Respiratory Management Strategies: Adequate rest  Endocrine Endocrine Symptoms Reported: No symptoms reported Is patient diabetic?: No    Gastrointestinal Gastrointestinal Symptoms Reported: No symptoms reported Additional Gastrointestinal Details: Reports eating good and going to the bathroom normal; confirms last bowel movement earlier this morning 04/12/24      Genitourinary Genitourinary Symptoms Reported: No symptoms reported    Integumentary Integumentary Symptoms Reported: No symptoms reported    Musculoskeletal Musculoskelatal Symptoms Reviewed: Limited mobility, Weakness, Back pain, Difficulty walking Additional Musculoskeletal Details: confirmed uses assistive devices on regular basis, at baseline -- walker and wheelchair; reports mainly using wheelchair since I got out of the hospital denies new/ recent falls post- recent hospital discharge; confirms home health PT active throughou [facility] Musculoskeletal Management Strategies: Medical device, Medication therapy, Coping strategies, Routine screening, Adequate rest   Fall risk Follow up: Falls prevention discussed, Education provided  Psychosocial Psychosocial Symptoms Reported: No symptoms reported Behavioral Management Strategies: Support system, Coping strategies Major Change/Loss/Stressor/Fears (CP): Denies Techniques to Cope with Loss/Stress/Change: Diversional activities Quality of Family Relationships: supportive, involved, helpful   There were no vitals filed for this  visit.  Medications Reviewed Today     Reviewed by Hayato Guaman M, RN (Registered Nurse) on 04/12/24 at 1354  Med List Status: <None>   Medication Order Taking? Sig Documenting Provider Last Dose Status  Informant  acetaminophen  (TYLENOL ) 500 MG tablet [id number]  Take 2 tablets (1,000 mg total) by mouth every 8 (eight) hours. Verdene Purchase, MD  Active Nursing Home Medication Administration Guide (MAG), Pharmacy Records  albuterol  (VENTOLIN  HFA) 108 [id number] Base) MCG/ACT inhaler [id number]  Inhale 1-2 puffs into the lungs every 6 (six) hours as needed for wheezing or shortness of breath. [provider]  Active Nursing Home Medication Administration Guide (MAG), Pharmacy Records  allopurinol  (ZYLOPRIM ) 300 MG tablet [id number] Yes Take 1 tablet (300 mg total) by mouth daily. Rothfuss, Jacob T, PA-C  Active Nursing Home Medication Administration Guide (MAG), Pharmacy Records  amiodarone  (PACERONE ) 200 MG tablet [id number]  Take 1 tablet (200 mg total) by mouth 2 (two) times daily.  Patient taking differently: Take 200 mg by mouth daily.   Croitoru, Mihai, MD  Active Nursing Home Medication Administration Guide (MAG), Pharmacy Records  Ascorbic Acid (VITAMIN C PO) [id number]  Take 250 mg by mouth in the morning. [provider]  Active Nursing Home Medication Administration Guide [facility]), Pharmacy Records           Med Note RENNIS, DUROJAHYE' R   Mon Apr 01, 2024  5:30 AM)    atorvastatin  (LIPITOR) 20 MG tablet [id number]  Take 1 tablet (20 mg total) by mouth daily.   Active Nursing Home Medication Administration Guide (MAG), Pharmacy Records  bisacodyl  (DULCOLAX) 10 MG suppository [id number]  Place 1 suppository (10 mg total) rectally daily as needed for moderate constipation. Verdene Purchase, MD  Active Nursing Home Medication Administration Guide (MAG), Pharmacy Records  busPIRone  (BUSPAR ) 10 MG tablet [id number]  TAKE 1 TABLET BY MOUTH TWICE A DAY  Patient not taking: Reported on 04/11/2024   Rothfuss, Jacob T, PA-C  Active Nursing Home Medication Administration Guide (MAG), Pharmacy Records  Calcium  Carb-Cholecalciferol  (CALCIUM  600 + D PO) [id number]  Take 1 tablet by mouth in the morning.  [provider]  Active Nursing Home Medication Administration Guide (MAG), Pharmacy Records  Cholecalciferol  (VITAMIN D3 PO) [id number]  Take 1,000 mcg by mouth in the morning. [provider]  Active Nursing Home Medication Administration Guide [facility]), Pharmacy Records           Med Note RENNIS, DUROJAHYE' R   Mon Apr 01, 2024  5:25 AM)    cyanocobalamin  (VITAMIN B12) 1000 MCG tablet [id number]  Take 1,000 mcg by mouth daily. [provider]  Active Nursing Home Medication Administration Guide (MAG), Pharmacy Records  diltiazem  (CARDIZEM  CD) 120 MG 24 hr capsule [id number]  Take 1 capsule (120 mg total) by mouth daily. Vernon Ranks, MD  Active   DULoxetine  (CYMBALTA ) 20 MG capsule [id number]  TAKE 1 CAPSULE BY MOUTH EVERY DAY Rothfuss, Jacob T, PA-C  Active Nursing Home Medication Administration Guide (MAG), Pharmacy Records  ferrous sulfate  325 (65 FE) MG tablet [id number]  Take 325 mg by mouth daily with breakfast. [provider]  Active Nursing Home Medication Administration Guide (MAG), Pharmacy Records           Med Note (GARNER, TIFFANY L   Wed Nov 15, 2023  2:59 PM)    ferumoxytol  (FERAHEME ) 510 mg in sodium chloride  0.9 % 100 mL IVPB [id number]   Von Bellis, MD  Active   folic acid  (FOLVITE ) 1 MG  tablet [id number]  Take 1 tablet (1 mg total) by mouth daily. Verdene Purchase, MD  Active Nursing Home Medication Administration Guide (MAG), Pharmacy Records  ipratropium-albuterol  (DUONEB) 0.5-2.5 (3) MG/3ML SOLN [id number]  Take 3 mLs by nebulization every 6 (six) hours as needed (for shortness of breath or wheezing). [provider]  Active Nursing Home Medication Administration Guide (MAG), Pharmacy Records  KLOR-CON  M20 20 MEQ tablet [id number]  Take 20 mEq by mouth in the morning and at bedtime. [provider]  Active Nursing Home Medication Administration Guide (MAG), Pharmacy Records  levothyroxine  (SYNTHROID ) 100 MCG tablet [id number]  Take  1 tablet (100 mcg total) by mouth daily before breakfast. Rothfuss, Jacob T, PA-C  Active Nursing Home Medication Administration Guide (MAG), Pharmacy Records  lidocaine  (LIDODERM ) 5 % [id number]  Place 1 patch onto the skin daily. Remove & Discard patch within 12 hours or as directed by MD Verdene Purchase, MD  Active Nursing Home Medication Administration Guide (MAG), Pharmacy Records  melatonin 3 MG TABS tablet [id number]  Take 1 tablet (3 mg total) by mouth at bedtime as needed (insomnia).  Patient taking differently: Take 3 mg by mouth at bedtime.   Verdene Purchase, MD  Active Nursing Home Medication Administration Guide (MAG), Pharmacy Records  methocarbamol  (ROBAXIN ) 500 MG tablet [id number]  TAKE 1 TABLET BY MOUTH EVERY 6 HOURS AS NEEDED FOR MUSCLE SPASMS. Rothfuss, Jacob T, PA-C  Active Nursing Home Medication Administration Guide (MAG), Pharmacy Records  ondansetron  (ZOFRAN ) 4 MG tablet [id number]  Take 4 mg by mouth every 6 (six) hours as needed for nausea or vomiting. [provider]  Active Nursing Home Medication Administration Guide [facility]), Pharmacy Records           Med Note CLAUD, ABBIE R   Thu Aug 24, 2023  2:37 PM)    pantoprazole  (PROTONIX ) 40 MG tablet [id number]  Take 1 tablet (40 mg total) by mouth 2 (two) times daily.  Patient taking differently: Take 40 mg by mouth daily.   Verdene Purchase, MD  Active Nursing Home Medication Administration Guide (MAG), Pharmacy Records  polyethylene glycol (MIRALAX  / GLYCOLAX ) 17 g packet [id number]  Take 17 g by mouth daily.  Patient taking differently: Take 17 g by mouth daily as needed.   Verdene Purchase, MD  Active Nursing Home Medication Administration Guide [facility]), Pharmacy Records  Potassium Chloride  ER 20 MEQ TBCR [id number]  Take 1 tablet by mouth daily. [provider]  Active Nursing Home Medication Administration Guide (MAG), Pharmacy Records  predniSONE  (DELTASONE ) 10 MG tablet [id number] Yes Take 4 tablets (40 mg total)  by mouth daily for 3 days, THEN 3 tablets (30 mg total) daily for 3 days, THEN 2 tablets (20 mg total) daily for 3 days, THEN 1 tablet (10 mg total) daily for 3 days. Vernon Ranks, MD  Active   senna-docusate (SENOKOT-S) 8.6-50 MG tablet [id number]  Take 2 tablets by mouth 2 (two) times daily.  Patient not taking: Reported on 04/11/2024   Krishnan, Gokul, MD  Active Nursing Home Medication Administration Guide (MAG), Pharmacy Records  sertraline  (ZOLOFT ) 25 MG tablet [id number]  Take 25 mg by mouth daily. [provider]  Active Nursing Home Medication Administration Guide (MAG), Pharmacy Records  sucralfate  (CARAFATE ) 1 g tablet [id number]  Take 1 tablet (1 g total) by mouth 2 (two) times daily.  Patient not taking: Reported on 04/11/2024   Krishnan, Gokul, MD  Active Nursing Home Medication Administration Guide (MAG), Pharmacy Records  TIADYLT  ER 300 MG  24 hr capsule [id number]  Take 300 mg by mouth See admin instructions. Give 1 capsule by mouth in the morning for atrial flutter IF SYSTOLIC IS LESS THAN 120 OR HEART LESS THAN 60  Patient not taking: Reported on 04/11/2024   [provider]  Active Nursing Home Medication Administration Guide (MAG), Pharmacy Records  torsemide  (DEMADEX ) 20 MG tablet [id number]  Take 1 tablet (20 mg total) by mouth daily for CHF.  Patient not taking: Reported on 04/11/2024     Active   traMADol  (ULTRAM ) 50 MG tablet [id number] Yes Take 1 tablet (50 mg total) by mouth every 8 (eight) hours as needed for up to 10 days for moderate pain (pain score 4-6) or severe pain (pain score 7-10). Rothfuss, Jacob T, PA-C  Active            Recommendation:   Specialty provider follow-up- with neurosurgical provider: patient reports has appointment but cannot tell me details of same Continue Current Plan of Care  Follow Up Plan:   Telephone follow-up in 1 week- as scheduled 04/18/24  Pls call/ message for questions,  Caeson Filippi Mckinney Wendy Mikles, RN, BSN, CCRN  Alumnus RN Care Manager  Transitions of Care  [facility] - [facility] [phone]: direct office

## 2024-04-12 NOTE — Patient Instructions (Signed)
 Visit Information  Thank you for taking time to visit with me today. Please don't hesitate to contact me if I can be of assistance to you before our next scheduled telephone appointment.  Our next appointment is by telephone on Thursday, April 18, 2024 at 1:00 pm with nurse Andrea  Please call the care guide team at [phone] if you need to cancel or reschedule your appointment.   Following are the goals we discussed today:  Patient Self Care Activities:  Attend all scheduled provider appointments Call pharmacy for medication refills 3-7 days in advance of running out of medications Call provider office for new concerns or questions  Notify RN Care Manager of TOC call rescheduling needs Participate in Transition of Care Program/Attend TOC scheduled calls Take medications as prescribed   check blood pressure daily take medications for blood pressure exactly as prescribed eat more whole grains, fruits and vegetables, lean meats and healthy fats Continue working with the home health team that is involved in your care Use assistive devices as needed to prevent falls- your walker and wheelchair If you believe your condition is getting worse- contact your care providers (doctors) promptly- reaching out to your doctor early when you have concerns can prevent you from having to go to the hospital  If you are experiencing a Mental Health or Behavioral Health Crisis or need someone to talk to, please  call the Suicide and Crisis Lifeline: 988 call the [place]  National Suicide Prevention Lifeline: 913-700-6612 or TTY: 9863885506 TTY 408-482-0192) to talk to a trained counselor call 1-800-273-TALK (toll free, 24 hour hotline) go to [facility] [address], [place] [phone]) call the [facility] Crisis Line: (586)053-8996 call 911   Patient verbalizes understanding of instructions and care plan provided today and agrees to view in MyChart.  Active MyChart status and patient understanding of how to access instructions and care plan via MyChart confirmed with patient.     Carrson Lightcap Mckinney Iceis Knab, RN, BSN, Media planner  Transitions of Care  [facility] - [facility] [phone]: direct office

## 2024-04-14 LAB — BASIC METABOLIC PANEL WITH GFR
BUN/Creatinine Ratio: 26 (ref 12–28)
BUN: 35 mg/dL — ABNORMAL HIGH (ref 8–27)
CO2: 22 mmol/L (ref 20–29)
Calcium: 8.6 mg/dL — ABNORMAL LOW (ref 8.7–10.3)
Chloride: 105 mmol/L (ref 96–106)
Creatinine, Ser: 1.35 mg/dL — ABNORMAL HIGH (ref 0.57–1.00)
Glucose: 127 mg/dL — ABNORMAL HIGH (ref 70–99)
Potassium: 5.6 mmol/L — ABNORMAL HIGH (ref 3.5–5.2)
Sodium: 139 mmol/L (ref 134–144)
eGFR: 40 mL/min/1.73 — ABNORMAL LOW (ref >59)

## 2024-04-14 LAB — CBC WITH DIFFERENTIAL/PLATELET
Basophils Absolute: 0 x10E3/uL (ref 0.0–0.2)
Basos: 0 %
EOS (ABSOLUTE): 0 x10E3/uL (ref 0.0–0.4)
Eos: 0 %
Hematocrit: 36.3 % (ref 34.0–46.6)
Hemoglobin: 10.7 g/dL — ABNORMAL LOW (ref 11.1–15.9)
Immature Grans (Abs): 0.3 x10E3/uL — ABNORMAL HIGH (ref 0.0–0.1)
Immature Granulocytes: 3 %
Lymphocytes Absolute: 0.2 x10E3/uL — ABNORMAL LOW (ref 0.7–3.1)
Lymphs: 3 %
MCH: 27.3 pg (ref 26.6–33.0)
MCHC: 29.5 g/dL — ABNORMAL LOW (ref 31.5–35.7)
MCV: 93 fL (ref 79–97)
Monocytes Absolute: 0.2 x10E3/uL (ref 0.1–0.9)
Monocytes: 2 %
Neutrophils Absolute: 7.7 x10E3/uL — ABNORMAL HIGH (ref 1.4–7.0)
Neutrophils: 92 %
Platelets: 92 x10E3/uL — CL (ref 150–450)
RBC: 3.92 x10E6/uL (ref 3.77–5.28)
RDW: 16 % — ABNORMAL HIGH (ref 11.7–15.4)
WBC: 8.4 x10E3/uL (ref 3.4–10.8)

## 2024-04-15 ENCOUNTER — Ambulatory Visit (HOSPITAL_BASED_OUTPATIENT_CLINIC_OR_DEPARTMENT_OTHER): Payer: Self-pay | Admitting: Student

## 2024-04-15 DIAGNOSIS — N179 Acute kidney failure, unspecified: Secondary | ICD-10-CM

## 2024-04-15 DIAGNOSIS — E875 Hyperkalemia: Secondary | ICD-10-CM | POA: Insufficient documentation

## 2024-04-15 DIAGNOSIS — D696 Thrombocytopenia, unspecified: Secondary | ICD-10-CM | POA: Insufficient documentation

## 2024-04-16 ENCOUNTER — Telehealth (HOSPITAL_BASED_OUTPATIENT_CLINIC_OR_DEPARTMENT_OTHER): Payer: Self-pay | Admitting: Student

## 2024-04-16 NOTE — Telephone Encounter (Signed)
 Copied from CRM #[id number]. Topic: Clinical - Home Health Verbal Orders >> Apr 16, 2024  2:01 PM [name] wrote: Caller/Agency: Rochelle  with  [facility]  Callback Number: [phone]  Service Requested: Physical Therapy Frequency: 2 week 3 and 1 week 5  Any new concerns about the patient? No

## 2024-04-16 NOTE — Telephone Encounter (Signed)
 Called Rochelle from [facility] w/ verbal approval for PT orders.

## 2024-04-16 NOTE — Telephone Encounter (Signed)
 Source  Peggy Armstrong (Patient)   Subject  Peggy Armstrong, Peggy Armstrong (Patient)   Topic  Clinical - Home Health Verbal Orders    Communication  Caller/Agency: Charletta from [facility]    Callback Number: [phone] voicemail's can be left    Service Requested: Occupational Therapy    Frequency: 1x a week for 7 weeks    Any new concerns about the patient? No

## 2024-04-16 NOTE — Telephone Encounter (Signed)
 Called Mora from [facility]. Left detailed msg for verbal approval for OT orders.

## 2024-04-17 ENCOUNTER — Other Ambulatory Visit (HOSPITAL_BASED_OUTPATIENT_CLINIC_OR_DEPARTMENT_OTHER): Payer: Self-pay

## 2024-04-17 ENCOUNTER — Other Ambulatory Visit (HOSPITAL_BASED_OUTPATIENT_CLINIC_OR_DEPARTMENT_OTHER)

## 2024-04-17 DIAGNOSIS — E875 Hyperkalemia: Secondary | ICD-10-CM | POA: Diagnosis not present

## 2024-04-17 DIAGNOSIS — N179 Acute kidney failure, unspecified: Secondary | ICD-10-CM | POA: Diagnosis not present

## 2024-04-17 DIAGNOSIS — D696 Thrombocytopenia, unspecified: Secondary | ICD-10-CM

## 2024-04-18 ENCOUNTER — Ambulatory Visit (HOSPITAL_BASED_OUTPATIENT_CLINIC_OR_DEPARTMENT_OTHER): Payer: Self-pay | Admitting: Student

## 2024-04-18 ENCOUNTER — Other Ambulatory Visit: Admitting: *Deleted

## 2024-04-18 LAB — BASIC METABOLIC PANEL WITH GFR
BUN/Creatinine Ratio: 21 (ref 12–28)
BUN: 20 mg/dL (ref 8–27)
CO2: 26 mmol/L (ref 20–29)
Calcium: 8.6 mg/dL — ABNORMAL LOW (ref 8.7–10.3)
Chloride: 103 mmol/L (ref 96–106)
Creatinine, Ser: 0.95 mg/dL (ref 0.57–1.00)
Glucose: 93 mg/dL (ref 70–99)
Potassium: 3.3 mmol/L — ABNORMAL LOW (ref 3.5–5.2)
Sodium: 140 mmol/L (ref 134–144)
eGFR: 61 mL/min/1.73 (ref >59)

## 2024-04-18 LAB — CBC WITH DIFFERENTIAL/PLATELET
Basophils Absolute: 0 x10E3/uL (ref 0.0–0.2)
Basos: 0 %
EOS (ABSOLUTE): 0.1 x10E3/uL (ref 0.0–0.4)
Eos: 2 %
Hematocrit: 32.4 % — ABNORMAL LOW (ref 34.0–46.6)
Hemoglobin: 9.6 g/dL — ABNORMAL LOW (ref 11.1–15.9)
Immature Grans (Abs): 0.1 x10E3/uL (ref 0.0–0.1)
Immature Granulocytes: 1 %
Lymphocytes Absolute: 0.4 x10E3/uL — ABNORMAL LOW (ref 0.7–3.1)
Lymphs: 5 %
MCH: 27.4 pg (ref 26.6–33.0)
MCHC: 29.6 g/dL — ABNORMAL LOW (ref 31.5–35.7)
MCV: 93 fL (ref 79–97)
Monocytes Absolute: 0.4 x10E3/uL (ref 0.1–0.9)
Monocytes: 5 %
Neutrophils Absolute: 6.6 x10E3/uL (ref 1.4–7.0)
Neutrophils: 87 %
Platelets: 41 x10E3/uL — CL (ref 150–450)
RBC: 3.5 x10E6/uL — ABNORMAL LOW (ref 3.77–5.28)
RDW: 16.2 % — ABNORMAL HIGH (ref 11.7–15.4)
WBC: 7.6 x10E3/uL (ref 3.4–10.8)

## 2024-04-18 LAB — PARATHYROID HORMONE, INTACT (NO CA): PTH: 13 pg/mL — ABNORMAL LOW (ref 15–65)

## 2024-04-18 LAB — CALCIUM, IONIZED: Calcium, Ion: 5 mg/dL (ref 4.5–5.6)

## 2024-04-18 NOTE — Transitions of Care (Post Inpatient/ED Visit) (Signed)
 Transition of Care week 3  Visit Note  04/18/2024  Name: Peggy Armstrong MRN: [id number]          DOB: Oct 13, 1945  Situation: Patient enrolled in [facility] 30-day program. Visit completed with Ms. Pohlman by telephone.   Background:   Initial Transition Care Management Follow-up Telephone Call Discharge Date and Diagnosis: 04/04/24, Intractable back pain   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   Cellulitis of left arm 08/14/2023   Cholangitis (HCC)    Choledocholithiasis 03/22/2017   Chronic anticoagulation 02/12/2024   Chronic diastolic CHF (congestive heart failure) (HCC) 11/08/2019   Chronic low back pain 07/18/2023   CKD (chronic kidney disease) stage 3, GFR 30-59 ml/min (HCC) 06/09/2017   CKD (chronic kidney disease), stage III (HCC) 06/09/2017   Closed compression fracture of body of L1 vertebra (HCC) 02/10/2024   Closed compression fracture of L2 lumbar vertebra, initial encounter (HCC) 05/11/2023   Clotting disorder    right DVT     Congestive heart failure (HCC) 05/18/2023   Constipation 02/21/2024   Dehydration 05/12/2023   Depressive disorder 05/18/2023   Difficulty walking 05/19/2023   Disorder of bone 10/25/2023   Displacement of bone 10/25/2023   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   Duodenal mass    Duodenal ulcer    DVT (deep venous thrombosis) (HCC)    right DVT   Dysphagia 05/19/2023   Epigastric pain    Essential hypertension    Fall at home,  initial encounter 10/21/2023   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   GAD (generalized anxiety disorder)    Gastric polyps 05/16/2023   Generalized weakness 05/12/2023   GI bleed 07/20/2023   Goals of care, counseling/discussion 02/20/2024   Gout    Gout    Hematemesis 11/05/2017   Hemorrhagic shock (HCC) 07/21/2023   History of depression 10/21/2023   History of DVT (deep vein thrombosis) 11/08/2019   History of GI bleed 10/21/2023   Hyperlipidemia 05/12/2023   Hypertension    Hypokalemia 07/03/2017   Hypothyroidism 05/12/2023   IDA (iron  deficiency anemia) 02/15/2024   Iron  deficiency anemia 08/02/2016   Left elbow avulsion fracture 10/21/2023   Left humeral fracture 10/21/2023   Long term current use of anticoagulant therapy 06/20/2023   Lumbar radiculopathy 12/15/2023   Macrocytic anemia 11/05/2017   Medication management 02/21/2024   Melena 11/05/2017   Morbid obesity (HCC) 11/08/2019   Mucosal abnormality of esophagus 02/14/2024   Muscle weakness 05/19/2023   Nausea & vomiting    Need for assistance with personal care 05/21/2023   Need for immunization against influenza 07/18/2019   Need for vaccination 04/23/2018   Obesity with body mass index 30 or greater 10/25/2023   Occult blood in stools 05/16/2023   Pain 02/21/2024   Palliative care encounter 02/20/2024   Paroxysmal atrial fibrillation (HCC) 05/12/2023   Peri-ampullary neoplasm    Poor dentition 08/14/2016   RA (rheumatoid arthritis) (HCC)    Reduced  mobility 06/20/2023   Restless leg 12/02/2022   Right shoulder pain 07/26/2018   Routine general medical examination at a health care facility 12/13/2019   S/P ERCP 05/05/2017   Severe anemia 02/10/2024   SIRS (systemic inflammatory response syndrome) (HCC) 03/22/2017   Symbolic dysfunction 05/19/2023   Unsteadiness on feet 05/21/2023   Upper GI bleed 06/20/2023    Assessment: Patient Reported Symptoms: Cognitive Cognitive Status: Able to follow  simple commands, Alert and oriented to person, place, and time, Normal speech and language skills      Neurological Neurological Review of Symptoms: Other: [place] Neurological Symptoms/Conditions [RPT]: Left side back pain today 6/10. Positioning helps manage the pain. PT twice weekly. Patient reports feeling great. Neurological Management Strategies: Medication therapy, Routine screening, Exercise, Adequate rest, Coping strategies Neurological Self-Management Outcome: 4 (good)  HEENT HEENT Symptoms Reported: Not assessed      Cardiovascular Cardiovascular Symptoms Reported: No symptoms reported Is patient checking Blood Pressure at home?: No    Respiratory Respiratory Symptoms Reported: No symptoms reported    Endocrine Endocrine Symptoms Reported: No symptoms reported    Gastrointestinal Gastrointestinal Symptoms Reported: No symptoms reported Additional Gastrointestinal Details: LBM 04/17/24      Genitourinary Genitourinary Symptoms Reported: No symptoms reported    Integumentary Integumentary Symptoms Reported: No symptoms reported    Musculoskeletal Musculoskelatal Symptoms Reviewed: Limited mobility Additional Musculoskeletal Details: PT twice weekly, utilizing wheelchair and walker for mobility. Musculoskeletal Self-Management Outcome: 4 (good) Musculoskeletal Comment: I have everything that I need      Psychosocial Psychosocial Symptoms Reported: Not assessed         There were no vitals filed for this visit.  Partial Medication Review completed with Patient. Patient's daughter was not available for complete review. Patient reports her PCP spent an hour with her during the last visit reviewing her medications. Medications Reviewed Today     Reviewed by Lucky Andrea LABOR, RN (Registered Nurse) on 04/18/24 at 1335  Med List Status: <None>   Medication Order Taking? Sig Documenting Provider Last Dose Status Informant  acetaminophen  (TYLENOL ) 500 MG tablet [id number] Yes  Take 2 tablets (1,000 mg total) by mouth every 8 (eight) hours. Verdene Purchase, MD  Active Nursing Home Medication Administration Guide (MAG), Pharmacy Records  albuterol  (VENTOLIN  HFA) 108 [phone] Base) MCG/ACT inhaler [id number]  Inhale 1-2 puffs into the lungs every 6 (six) hours as needed for wheezing or shortness of breath.  Patient not taking: Reported on 04/18/2024   [provider]  Active Nursing Home Medication Administration Guide (MAG), Pharmacy Records  allopurinol  (ZYLOPRIM ) 300 MG tablet [id number]  Take 1 tablet (300 mg total) by mouth daily. Rothfuss, Jacob T, PA-C  Active Nursing Home Medication Administration Guide (MAG), Pharmacy Records  amiodarone  (PACERONE ) 200 MG tablet [id number]  Take 1 tablet (200 mg total) by mouth 2 (two) times daily.  Patient taking differently: Take 200 mg by mouth daily.   Croitoru, Mihai, MD  Active Nursing Home Medication Administration Guide (MAG), Pharmacy Records  Ascorbic Acid (VITAMIN C PO) [id number]  Take 250 mg by mouth in the morning. [provider]  Active Nursing Home Medication Administration Guide [facility]), Pharmacy Records           Med Note RENNIS, DUROJAHYE' R   Mon Apr 01, 2024  5:30 AM)    atorvastatin  (LIPITOR) 20 MG tablet [id number]  Take 1 tablet (20 mg total) by mouth daily.   Active Nursing Home Medication Administration Guide (MAG), Pharmacy Records  bisacodyl  (DULCOLAX) 10  MG suppository [id number]  Place 1 suppository (10 mg total) rectally daily as needed for moderate constipation.  Patient not taking: Reported on 04/18/2024   Krishnan, Gokul, MD  Active Nursing Home Medication Administration Guide (MAG), Pharmacy Records  busPIRone  (BUSPAR ) 10 MG tablet [id number]  TAKE 1 TABLET BY MOUTH TWICE A DAY  Patient not taking: Reported on 04/11/2024   Rothfuss, Jacob T, PA-C  Active Nursing Home Medication Administration Guide (MAG), Pharmacy Records  Calcium  Carb-Cholecalciferol  (CALCIUM  600 + D PO) [id number]  Take 1  tablet by mouth in the morning. [provider]  Active Nursing Home Medication Administration Guide (MAG), Pharmacy Records  Cholecalciferol  (VITAMIN D3 PO) [id number]  Take 1,000 mcg by mouth in the morning. [provider]  Active Nursing Home Medication Administration Guide [facility]), Pharmacy Records           Med Note RENNIS, DUROJAHYE' R   Mon Apr 01, 2024  5:25 AM)    cyanocobalamin  (VITAMIN B12) 1000 MCG tablet [id number]  Take 1,000 mcg by mouth daily. [provider]  Active Nursing Home Medication Administration Guide (MAG), Pharmacy Records  diltiazem  (CARDIZEM  CD) 120 MG 24 hr capsule [id number]  Take 1 capsule (120 mg total) by mouth daily. Vernon Ranks, MD  Active   DULoxetine  (CYMBALTA ) 20 MG capsule [id number]  TAKE 1 CAPSULE BY MOUTH EVERY DAY Rothfuss, Jacob T, PA-C  Active Nursing Home Medication Administration Guide (MAG), Pharmacy Records  ferrous sulfate  325 (65 FE) MG tablet [id number]  Take 325 mg by mouth daily with breakfast. [provider]  Active Nursing Home Medication Administration Guide (MAG), Pharmacy Records           Med Note (GARNER, TIFFANY L   Wed Nov 15, 2023  2:59 PM)    ferumoxytol  (FERAHEME ) 510 mg in sodium chloride  0.9 % 100 mL IVPB [id number]   Von Bellis, MD  Active   folic acid  (FOLVITE ) 1 MG tablet [id number]  Take 1 tablet (1 mg total) by mouth daily. Verdene Purchase, MD  Active Nursing Home Medication Administration Guide (MAG), Pharmacy Records  ipratropium-albuterol  (DUONEB) 0.5-2.5 (3) MG/3ML SOLN [id number]  Take 3 mLs by nebulization every 6 (six) hours as needed (for shortness of breath or wheezing).  Patient not taking: Reported on 04/18/2024   [provider]  Active Nursing Home Medication Administration Guide (MAG), Pharmacy Records  levothyroxine  (SYNTHROID ) 100 MCG tablet [id number] Yes Take 1 tablet (100 mcg total) by mouth daily before breakfast. Rothfuss, Jacob T, PA-C  Active Nursing Home  Medication Administration Guide (MAG), Pharmacy Records  lidocaine  (LIDODERM ) 5 % [id number] Yes Place 1 patch onto the skin daily. Remove & Discard patch within 12 hours or as directed by MD Verdene Purchase, MD  Active Nursing Home Medication Administration Guide (MAG), Pharmacy Records  melatonin 3 MG TABS tablet [id number]  Take 1 tablet (3 mg total) by mouth at bedtime as needed (insomnia).  Patient taking differently: Take 3 mg by mouth at bedtime.   Verdene Purchase, MD  Active Nursing Home Medication Administration Guide (MAG), Pharmacy Records  methocarbamol  (ROBAXIN ) 500 MG tablet [id number] Yes TAKE 1 TABLET BY MOUTH EVERY 6 HOURS AS NEEDED FOR MUSCLE SPASMS. Rothfuss, Jacob T, PA-C  Active Nursing Home Medication Administration Guide (MAG), Pharmacy Records  ondansetron  (ZOFRAN ) 4 MG tablet [id number]  Take 4 mg by mouth every 6 (six) hours as needed for nausea or vomiting. [provider]  Active Nursing Home Medication Administration Guide (MAG), Pharmacy Records  Med Note (HARRIS, ABBIE R   Thu Aug 24, 2023  2:37 PM)    pantoprazole  (PROTONIX ) 40 MG tablet [id number]  Take 1 tablet (40 mg total) by mouth 2 (two) times daily.  Patient taking differently: Take 40 mg by mouth daily.   Verdene Purchase, MD  Active Nursing Home Medication Administration Guide (MAG), Pharmacy Records  polyethylene glycol (MIRALAX  / GLYCOLAX ) 17 g packet [id number]  Take 17 g by mouth daily.  Patient not taking: Reported on 04/18/2024   Krishnan, Gokul, MD  Active Nursing Home Medication Administration Guide [facility]), Pharmacy Records  Potassium Chloride  ER 20 MEQ TBCR [id number]  Take 1 tablet by mouth daily. [provider]  Active Nursing Home Medication Administration Guide (MAG), Pharmacy Records  senna-docusate (SENOKOT-S) 8.6-50 MG tablet [id number]  Take 2 tablets by mouth 2 (two) times daily.  Patient not taking: Reported on 04/11/2024   Krishnan, Gokul, MD  Active Nursing Home  Medication Administration Guide (MAG), Pharmacy Records  sertraline  (ZOLOFT ) 25 MG tablet [id number]  Take 25 mg by mouth daily. [provider]  Active Nursing Home Medication Administration Guide (MAG), Pharmacy Records  sucralfate  (CARAFATE ) 1 g tablet [id number]  Take 1 tablet (1 g total) by mouth 2 (two) times daily.  Patient not taking: Reported on 04/11/2024   Krishnan, Gokul, MD  Active Nursing Home Medication Administration Guide (MAG), Pharmacy Records  TIADYLT  ER 300 MG 24 hr capsule [id number]  Take 300 mg by mouth See admin instructions. Give 1 capsule by mouth in the morning for atrial flutter IF SYSTOLIC IS LESS THAN 120 OR HEART LESS THAN 60  Patient not taking: Reported on 04/11/2024   [provider]  Active Nursing Home Medication Administration Guide (MAG), Pharmacy Records  torsemide  (DEMADEX ) 20 MG tablet [id number]  Take 1 tablet (20 mg total) by mouth daily for CHF.  Patient not taking: Reported on 04/11/2024     Active   traMADol  (ULTRAM ) 50 MG tablet [id number] Yes Take 1 tablet (50 mg total) by mouth every 8 (eight) hours as needed for up to 10 days for moderate pain (pain score 4-6) or severe pain (pain score 7-10). Rothfuss, Jacob T, PA-C  Active             Recommendation:   Continue Current Plan of Care  Follow Up Plan:   Telephone follow-up in 1 week  Andrea Dimes RN, BSN [place]  [facility] RN Care Manager [phone]

## 2024-04-18 NOTE — Patient Instructions (Signed)
 Visit Information  Thank you for taking time to visit with me today. Please don't hesitate to contact me if I can be of assistance to you before our next scheduled telephone appointment.   Following is a copy of your care plan:   Goals Addressed             This Visit's Progress    VBCI Transitions of Care (TOC) Care Plan       Problems:  Recent Hospitalization for treatment of Back pain related to spinal stenosis Medication management barrier -daughter is going through cancer treatment and having difficulty managing her mother's medications     Goal:  Over the next 30 days, the patient will not experience hospital readmission  Interventions:  Transitions of Care: week # 2 outreach: 04/12/24 Durable Medical Equipment (DME) reviewed with patient/caregiver Doctor Visits  - discussed the importance of doctor visits Post discharge activity limitations prescribed by provider reviewed Discussed compliance packaging via [facility] in [place] [phone] plans to transfer medications and request compliance packaging Advised contacting Neurosurgeon to schedule follow up-patient unsure if follow up has been scheduled, patient's dtr is finishing up chemo treatment and will schedule at completion Medications reviewed-partial review completed, patient's dtr was not available today for complete medication review Discussed diet and exercise for managing HTN-recent BP was good Confirmed patient is active with [facility] Reviewed upcoming appointments including:Cancer Center on 04/23/24 and Cardiology on 04/26/24-patient is aware  Discussed recent lab results-patient is aware of low platelets, bleeding precautions reviewed  Patient Self Care Activities:  Attend all scheduled provider appointments Call pharmacy for medication refills 3-7 days in advance of running out of medications Call provider office for new concerns or questions  Notify RN Care Manager of TOC call rescheduling  needs Participate in Transition of Care Program/Attend TOC scheduled calls Take medications as prescribed   check blood pressure daily take medications for blood pressure exactly as prescribed eat more whole grains, fruits and vegetables, lean meats and healthy fats Continue working with the home health team that is involved in your care Use assistive devices as needed to prevent falls- your walker and wheelchair If you believe your condition is getting worse- contact your care providers (doctors) promptly- reaching out to your doctor early when you have concerns can prevent you from having to go to the hospital Schedule follow up with Neurosurgeon  Plan:  Telephone follow up appointment with care management team member scheduled for:  04/24/24 at 1:00 pm        Patient verbalizes understanding of instructions and care plan provided today and agrees to view in MyChart. Active MyChart status and patient understanding of how to access instructions and care plan via MyChart confirmed with patient.     Telephone follow up appointment with care management team member scheduled for:04/24/24 at 1pm  Please call the care guide team at [phone] if you need to cancel or reschedule your appointment.   Please call 1-800-273-TALK (toll free, 24 hour hotline) call 911 if you are experiencing a Mental Health or Behavioral Health Crisis or need someone to talk to.  Andrea Dimes RN, BSN [place]  [facility] RN Care Manager [phone]

## 2024-04-18 NOTE — Progress Notes (Signed)
 Called daughter and also discussed with hematology. Based on recent scan, it was recommended that she proceed with follow up on Tuesday with heme/onc.   Potassium was actually low now. Can we confirm if she is taking her torsemide  or not? If she is still taking it then she will need to begin back on the potassium supplement.

## 2024-04-19 ENCOUNTER — Telehealth (HOSPITAL_BASED_OUTPATIENT_CLINIC_OR_DEPARTMENT_OTHER): Payer: Self-pay

## 2024-04-19 NOTE — Telephone Encounter (Signed)
 Called sandra and gave her VO approval.   Copied from CRM [id number]. Topic: Clinical - Home Health Verbal Orders >> Apr 18, 2024 11:50 AM [place] M wrote: Caller/Agency: Nena with [facility] Callback Number: [phone] Service Requested: Skilled Nursing Frequency: Weekly x3 and every other week x2 Any new concerns about the patient? No

## 2024-04-22 ENCOUNTER — Emergency Department (HOSPITAL_COMMUNITY)

## 2024-04-22 ENCOUNTER — Inpatient Hospital Stay (HOSPITAL_BASED_OUTPATIENT_CLINIC_OR_DEPARTMENT_OTHER)

## 2024-04-22 ENCOUNTER — Observation Stay (HOSPITAL_COMMUNITY)
Admission: EM | Admit: 2024-04-22 | Discharge: 2024-04-23 | Disposition: A | Discharge status: Home / Self Care | Attending: Internal Medicine | Admitting: Internal Medicine

## 2024-04-22 ENCOUNTER — Encounter (HOSPITAL_COMMUNITY): Payer: Self-pay

## 2024-04-22 ENCOUNTER — Other Ambulatory Visit: Payer: Self-pay

## 2024-04-22 DIAGNOSIS — R7989 Other specified abnormal findings of blood chemistry: Secondary | ICD-10-CM | POA: Diagnosis not present

## 2024-04-22 DIAGNOSIS — X58XXXA Exposure to other specified factors, initial encounter: Secondary | ICD-10-CM | POA: Insufficient documentation

## 2024-04-22 DIAGNOSIS — J189 Pneumonia, unspecified organism: Secondary | ICD-10-CM | POA: Diagnosis not present

## 2024-04-22 DIAGNOSIS — I5033 Acute on chronic diastolic (congestive) heart failure: Principal | ICD-10-CM | POA: Diagnosis present

## 2024-04-22 DIAGNOSIS — I517 Cardiomegaly: Secondary | ICD-10-CM | POA: Diagnosis not present

## 2024-04-22 DIAGNOSIS — I5032 Chronic diastolic (congestive) heart failure: Secondary | ICD-10-CM | POA: Diagnosis present

## 2024-04-22 DIAGNOSIS — G8929 Other chronic pain: Secondary | ICD-10-CM | POA: Diagnosis not present

## 2024-04-22 DIAGNOSIS — S32012A Unstable burst fracture of first lumbar vertebra, initial encounter for closed fracture: Secondary | ICD-10-CM | POA: Insufficient documentation

## 2024-04-22 DIAGNOSIS — C170 Malignant neoplasm of duodenum: Secondary | ICD-10-CM | POA: Diagnosis not present

## 2024-04-22 DIAGNOSIS — I4892 Unspecified atrial flutter: Secondary | ICD-10-CM | POA: Insufficient documentation

## 2024-04-22 DIAGNOSIS — I5031 Acute diastolic (congestive) heart failure: Secondary | ICD-10-CM | POA: Diagnosis not present

## 2024-04-22 DIAGNOSIS — I700 Atherosclerosis of aorta: Secondary | ICD-10-CM | POA: Insufficient documentation

## 2024-04-22 DIAGNOSIS — Z8719 Personal history of other diseases of the digestive system: Secondary | ICD-10-CM

## 2024-04-22 DIAGNOSIS — E785 Hyperlipidemia, unspecified: Secondary | ICD-10-CM | POA: Diagnosis present

## 2024-04-22 DIAGNOSIS — Z6832 Body mass index (BMI) 32.0-32.9, adult: Secondary | ICD-10-CM | POA: Insufficient documentation

## 2024-04-22 DIAGNOSIS — M545 Low back pain, unspecified: Secondary | ICD-10-CM | POA: Insufficient documentation

## 2024-04-22 DIAGNOSIS — E66811 Obesity, class 1: Secondary | ICD-10-CM | POA: Insufficient documentation

## 2024-04-22 DIAGNOSIS — I509 Heart failure, unspecified: Secondary | ICD-10-CM | POA: Diagnosis not present

## 2024-04-22 DIAGNOSIS — D696 Thrombocytopenia, unspecified: Secondary | ICD-10-CM | POA: Diagnosis not present

## 2024-04-22 DIAGNOSIS — I129 Hypertensive chronic kidney disease with stage 1 through stage 4 chronic kidney disease, or unspecified chronic kidney disease: Secondary | ICD-10-CM | POA: Insufficient documentation

## 2024-04-22 DIAGNOSIS — I110 Hypertensive heart disease with heart failure: Secondary | ICD-10-CM | POA: Diagnosis not present

## 2024-04-22 DIAGNOSIS — R918 Other nonspecific abnormal finding of lung field: Secondary | ICD-10-CM | POA: Diagnosis not present

## 2024-04-22 DIAGNOSIS — I4581 Long QT syndrome: Secondary | ICD-10-CM | POA: Insufficient documentation

## 2024-04-22 DIAGNOSIS — M5489 Other dorsalgia: Secondary | ICD-10-CM

## 2024-04-22 DIAGNOSIS — F411 Generalized anxiety disorder: Secondary | ICD-10-CM | POA: Diagnosis present

## 2024-04-22 DIAGNOSIS — M4800 Spinal stenosis, site unspecified: Secondary | ICD-10-CM | POA: Insufficient documentation

## 2024-04-22 DIAGNOSIS — Z79899 Other long term (current) drug therapy: Secondary | ICD-10-CM | POA: Insufficient documentation

## 2024-04-22 DIAGNOSIS — I4891 Unspecified atrial fibrillation: Secondary | ICD-10-CM | POA: Insufficient documentation

## 2024-04-22 DIAGNOSIS — Z8679 Personal history of other diseases of the circulatory system: Secondary | ICD-10-CM

## 2024-04-22 DIAGNOSIS — E039 Hypothyroidism, unspecified: Secondary | ICD-10-CM | POA: Diagnosis present

## 2024-04-22 DIAGNOSIS — E038 Other specified hypothyroidism: Secondary | ICD-10-CM | POA: Diagnosis not present

## 2024-04-22 DIAGNOSIS — N1831 Chronic kidney disease, stage 3a: Secondary | ICD-10-CM | POA: Insufficient documentation

## 2024-04-22 DIAGNOSIS — C73 Malignant neoplasm of thyroid gland: Secondary | ICD-10-CM | POA: Insufficient documentation

## 2024-04-22 DIAGNOSIS — S42202A Unspecified fracture of upper end of left humerus, initial encounter for closed fracture: Secondary | ICD-10-CM | POA: Diagnosis not present

## 2024-04-22 LAB — MRSA NEXT GEN BY PCR, NASAL: MRSA by PCR Next Gen: DETECTED — AB

## 2024-04-22 LAB — CBC
HCT: 30.4 % — ABNORMAL LOW (ref 36.0–46.0)
HCT: 28.5 % — ABNORMAL LOW (ref 36.0–46.0)
Hemoglobin: 8.9 g/dL — ABNORMAL LOW (ref 12.0–15.0)
Hemoglobin: 8.5 g/dL — ABNORMAL LOW (ref 12.0–15.0)
MCH: 27.4 pg (ref 26.0–34.0)
MCH: 28.2 pg (ref 26.0–34.0)
MCHC: 29.3 g/dL — ABNORMAL LOW (ref 30.0–36.0)
MCHC: 29.8 g/dL — ABNORMAL LOW (ref 30.0–36.0)
MCV: 93.5 fL (ref 80.0–100.0)
MCV: 94.7 fL (ref 80.0–100.0)
Platelets: 48 K/uL — ABNORMAL LOW (ref 150–400)
Platelets: 42 K/uL — ABNORMAL LOW (ref 150–400)
RBC: 3.25 MIL/uL — ABNORMAL LOW (ref 3.87–5.11)
RBC: 3.01 MIL/uL — ABNORMAL LOW (ref 3.87–5.11)
RDW: 18.4 % — ABNORMAL HIGH (ref 11.5–15.5)
RDW: 18.4 % — ABNORMAL HIGH (ref 11.5–15.5)
WBC: 7.8 K/uL (ref 4.0–10.5)
WBC: 6.7 K/uL (ref 4.0–10.5)
nRBC: 0 % (ref 0.0–0.2)
nRBC: 0 % (ref 0.0–0.2)

## 2024-04-22 LAB — TROPONIN T, HIGH SENSITIVITY
Troponin T High Sensitivity: 28 ng/L — ABNORMAL HIGH (ref 0–19)
Troponin T High Sensitivity: 36 ng/L — ABNORMAL HIGH (ref 0–19)

## 2024-04-22 LAB — RESPIRATORY PANEL BY PCR

## 2024-04-22 LAB — COMPREHENSIVE METABOLIC PANEL WITH GFR
ALT: 60 U/L — ABNORMAL HIGH (ref 0–44)
ALT: 54 U/L — ABNORMAL HIGH (ref 0–44)
AST: 44 U/L — ABNORMAL HIGH (ref 15–41)
AST: 40 U/L (ref 15–41)
Albumin: 3.3 g/dL — ABNORMAL LOW (ref 3.5–5.0)
Albumin: 3 g/dL — ABNORMAL LOW (ref 3.5–5.0)
Alkaline Phosphatase: 185 U/L — ABNORMAL HIGH (ref 38–126)
Alkaline Phosphatase: 169 U/L — ABNORMAL HIGH (ref 38–126)
Anion gap: 9 (ref 5–15)
Anion gap: 10 (ref 5–15)
BUN: 16 mg/dL (ref 8–23)
BUN: 16 mg/dL (ref 8–23)
CO2: 27 mmol/L (ref 22–32)
CO2: 27 mmol/L (ref 22–32)
Calcium: 8.3 mg/dL — ABNORMAL LOW (ref 8.9–10.3)
Calcium: 7.9 mg/dL — ABNORMAL LOW (ref 8.9–10.3)
Chloride: 102 mmol/L (ref 98–111)
Chloride: 101 mmol/L (ref 98–111)
Creatinine, Ser: 0.8 mg/dL (ref 0.44–1.00)
Creatinine, Ser: 0.8 mg/dL (ref 0.44–1.00)
GFR, Estimated: >60 mL/min (ref >60)
GFR, Estimated: >60 mL/min (ref >60)
Glucose, Bld: 111 mg/dL — ABNORMAL HIGH (ref 70–99)
Glucose, Bld: 101 mg/dL — ABNORMAL HIGH (ref 70–99)
Potassium: 3.5 mmol/L (ref 3.5–5.1)
Potassium: 3.2 mmol/L — ABNORMAL LOW (ref 3.5–5.1)
Sodium: 138 mmol/L (ref 135–145)
Sodium: 138 mmol/L (ref 135–145)
Total Bilirubin: 0.7 mg/dL (ref 0.0–1.2)
Total Bilirubin: 0.4 mg/dL (ref 0.0–1.2)
Total Protein: 5.9 g/dL — ABNORMAL LOW (ref 6.5–8.1)
Total Protein: 5.4 g/dL — ABNORMAL LOW (ref 6.5–8.1)

## 2024-04-22 LAB — ECHOCARDIOGRAM LIMITED
Area-P 1/2: 2.59 cm2
Height: 60 in
S' Lateral: 4.2 cm
Weight: 2624.36 [oz_av]

## 2024-04-22 LAB — PRO BRAIN NATRIURETIC PEPTIDE: Pro Brain Natriuretic Peptide: 4643 pg/mL — ABNORMAL HIGH (ref <300.0)

## 2024-04-22 LAB — STREP PNEUMONIAE URINARY ANTIGEN: Strep Pneumo Urinary Antigen: NEGATIVE

## 2024-04-22 LAB — PROCALCITONIN: Procalcitonin: 0.13 ng/mL

## 2024-04-22 MED ORDER — SUCRALFATE 1 GM/10ML PO SUSP
1.0000 g | Freq: Three times a day (TID) | ORAL | Status: DC
  Administered 2024-04-22 – 2024-04-23 (×6): 1 g via ORAL
  Filled 2024-04-22 (×6): qty 10

## 2024-04-22 MED ORDER — ACETAMINOPHEN 650 MG RE SUPP: 650.0000 mg | Freq: Four times a day (QID) | RECTAL | Status: DC | PRN

## 2024-04-22 MED ORDER — VANCOMYCIN HCL 750 MG/150ML IV SOLN: 750.0000 mg | INTRAVENOUS | Status: DC

## 2024-04-22 MED ORDER — PANTOPRAZOLE SODIUM 40 MG PO TBEC
40.0000 mg | DELAYED_RELEASE_TABLET | Freq: Every day | ORAL | Status: DC
  Administered 2024-04-22 – 2024-04-23 (×2): 40 mg via ORAL
  Filled 2024-04-22 (×2): qty 1

## 2024-04-22 MED ORDER — SODIUM CHLORIDE 0.9 % IV SOLN: 250.0000 mL | INTRAVENOUS | Status: AC | PRN

## 2024-04-22 MED ORDER — SODIUM CHLORIDE 0.9% FLUSH
3.0000 mL | Freq: Two times a day (BID) | INTRAVENOUS | Status: DC
  Administered 2024-04-22 – 2024-04-23 (×4): 3 mL via INTRAVENOUS

## 2024-04-22 MED ORDER — TRIMETHOBENZAMIDE HCL 100 MG/ML IM SOLN
200.0000 mg | Freq: Four times a day (QID) | INTRAMUSCULAR | Status: DC | PRN
Start: 2024-04-22 — End: 2024-04-24

## 2024-04-22 MED ORDER — METHOCARBAMOL 500 MG PO TABS
500.0000 mg | ORAL_TABLET | Freq: Four times a day (QID) | ORAL | Status: DC | PRN
  Administered 2024-04-22 – 2024-04-23 (×5): 500 mg via ORAL
  Filled 2024-04-22 (×5): qty 1

## 2024-04-22 MED ORDER — AMIODARONE HCL 200 MG PO TABS
200.0000 mg | ORAL_TABLET | Freq: Every day | ORAL | Status: DC
  Administered 2024-04-22 – 2024-04-23 (×2): 200 mg via ORAL
  Filled 2024-04-22 (×3): qty 1

## 2024-04-22 MED ORDER — ONDANSETRON HCL 4 MG/2ML IJ SOLN: 4.0000 mg | Freq: Four times a day (QID) | INTRAMUSCULAR | Status: DC | PRN

## 2024-04-22 MED ORDER — AZITHROMYCIN 250 MG PO TABS
500.0000 mg | ORAL_TABLET | Freq: Once | ORAL | Status: AC
  Administered 2024-04-22: 500 mg via ORAL
  Filled 2024-04-22: qty 2

## 2024-04-22 MED ORDER — SODIUM CHLORIDE 0.9 % IV SOLN
1.0000 g | Freq: Once | INTRAVENOUS | Status: AC
  Administered 2024-04-22: 1 g via INTRAVENOUS
  Filled 2024-04-22: qty 10

## 2024-04-22 MED ORDER — SODIUM CHLORIDE 0.9% FLUSH: 3.0000 mL | INTRAVENOUS | Status: DC | PRN

## 2024-04-22 MED ORDER — SODIUM CHLORIDE 0.9 % IV SOLN
1.0000 g | INTRAVENOUS | Status: DC
  Administered 2024-04-23: 1 g via INTRAVENOUS
  Filled 2024-04-22 (×2): qty 10

## 2024-04-22 MED ORDER — LEVOTHYROXINE SODIUM 100 MCG PO TABS
100.0000 ug | ORAL_TABLET | Freq: Every day | ORAL | Status: DC
  Administered 2024-04-22 – 2024-04-23 (×2): 100 ug via ORAL
  Filled 2024-04-22 (×2): qty 1

## 2024-04-22 MED ORDER — HYDROMORPHONE HCL 1 MG/ML IJ SOLN
0.5000 mg | Freq: Once | INTRAMUSCULAR | Status: AC
  Administered 2024-04-22: 0.5 mg via INTRAVENOUS
  Filled 2024-04-22: qty 1

## 2024-04-22 MED ORDER — HYDROCODONE-ACETAMINOPHEN 5-325 MG PO TABS
1.0000 | ORAL_TABLET | Freq: Four times a day (QID) | ORAL | Status: DC | PRN
Start: 2024-04-22 — End: 2024-04-24
  Administered 2024-04-22 – 2024-04-23 (×5): 2 via ORAL
  Filled 2024-04-22: qty 1
  Filled 2024-04-22: qty 2
  Filled 2024-04-22: qty 1
  Filled 2024-04-22 (×4): qty 2

## 2024-04-22 MED ORDER — FUROSEMIDE 10 MG/ML IJ SOLN
40.0000 mg | Freq: Once | INTRAMUSCULAR | Status: AC
  Administered 2024-04-22: 40 mg via INTRAVENOUS
  Filled 2024-04-22: qty 4

## 2024-04-22 MED ORDER — POTASSIUM CHLORIDE CRYS ER 20 MEQ PO TBCR
40.0000 meq | EXTENDED_RELEASE_TABLET | Freq: Once | ORAL | Status: AC
  Administered 2024-04-22: 40 meq via ORAL
  Filled 2024-04-22: qty 2

## 2024-04-22 MED ORDER — VANCOMYCIN HCL 1500 MG/300ML IV SOLN
1500.0000 mg | Freq: Once | INTRAVENOUS | Status: AC
  Administered 2024-04-22: 1500 mg via INTRAVENOUS
  Filled 2024-04-22: qty 300

## 2024-04-22 MED ORDER — FUROSEMIDE 10 MG/ML IJ SOLN
20.0000 mg | Freq: Two times a day (BID) | INTRAMUSCULAR | Status: DC
  Administered 2024-04-22: 20 mg via INTRAVENOUS
  Filled 2024-04-22: qty 2

## 2024-04-22 MED ORDER — ONDANSETRON HCL 4 MG PO TABS: 4.0000 mg | ORAL_TABLET | Freq: Four times a day (QID) | ORAL | Status: DC | PRN

## 2024-04-22 MED ORDER — DILTIAZEM HCL ER COATED BEADS 120 MG PO CP24: 120.0000 mg | ORAL_CAPSULE | Freq: Every day | ORAL | Status: DC

## 2024-04-22 MED ORDER — IPRATROPIUM-ALBUTEROL 0.5-2.5 (3) MG/3ML IN SOLN: 3.0000 mL | Freq: Four times a day (QID) | RESPIRATORY_TRACT | Status: DC | PRN

## 2024-04-22 MED ORDER — SODIUM CHLORIDE 0.9% FLUSH
3.0000 mL | Freq: Two times a day (BID) | INTRAVENOUS | Status: DC
Start: 2024-04-22 — End: 2024-04-24
  Administered 2024-04-22 – 2024-04-23 (×3): 3 mL via INTRAVENOUS

## 2024-04-22 MED ORDER — IPRATROPIUM-ALBUTEROL 0.5-2.5 (3) MG/3ML IN SOLN
3.0000 mL | Freq: Once | RESPIRATORY_TRACT | Status: AC
  Administered 2024-04-22: 3 mL via RESPIRATORY_TRACT
  Filled 2024-04-22: qty 3

## 2024-04-22 MED ORDER — ACETAMINOPHEN 325 MG PO TABS
650.0000 mg | ORAL_TABLET | Freq: Four times a day (QID) | ORAL | Status: DC | PRN
Start: 2024-04-22 — End: 2024-04-24
  Administered 2024-04-22: 650 mg via ORAL
  Filled 2024-04-22: qty 2

## 2024-04-22 MED ORDER — FERROUS SULFATE 325 (65 FE) MG PO TABS
325.0000 mg | ORAL_TABLET | Freq: Every day | ORAL | Status: DC
  Administered 2024-04-22 – 2024-04-23 (×2): 325 mg via ORAL
  Filled 2024-04-22 (×2): qty 1

## 2024-04-22 MED ORDER — DULOXETINE HCL 20 MG PO CPEP
20.0000 mg | ORAL_CAPSULE | Freq: Every day | ORAL | Status: DC
  Administered 2024-04-22 – 2024-04-23 (×2): 20 mg via ORAL
  Filled 2024-04-22 (×2): qty 1

## 2024-04-22 MED ORDER — ATORVASTATIN CALCIUM 20 MG PO TABS
20.0000 mg | ORAL_TABLET | Freq: Every day | ORAL | Status: DC
  Administered 2024-04-22 – 2024-04-23 (×2): 20 mg via ORAL
  Filled 2024-04-22 (×2): qty 1

## 2024-04-22 MED ORDER — FOLIC ACID 1 MG PO TABS
1.0000 mg | ORAL_TABLET | Freq: Every day | ORAL | Status: DC
  Administered 2024-04-22 – 2024-04-23 (×2): 1 mg via ORAL
  Filled 2024-04-22 (×2): qty 1

## 2024-04-22 MED ORDER — TORSEMIDE 20 MG PO TABS
20.0000 mg | ORAL_TABLET | Freq: Every day | ORAL | Status: DC
  Administered 2024-04-23: 20 mg via ORAL
  Filled 2024-04-22: qty 1

## 2024-04-22 MED ORDER — ALLOPURINOL 300 MG PO TABS
300.0000 mg | ORAL_TABLET | Freq: Every day | ORAL | Status: DC
  Administered 2024-04-22 – 2024-04-23 (×2): 300 mg via ORAL
  Filled 2024-04-22 (×2): qty 1

## 2024-04-22 MED ORDER — VITAMIN B-12 1000 MCG PO TABS
1000.0000 ug | ORAL_TABLET | Freq: Every day | ORAL | Status: DC
  Administered 2024-04-22 – 2024-04-23 (×2): 1000 ug via ORAL
  Filled 2024-04-22 (×2): qty 1

## 2024-04-22 MED ORDER — AZITHROMYCIN 500 MG PO TABS
500.0000 mg | ORAL_TABLET | Freq: Every day | ORAL | Status: DC
  Administered 2024-04-23: 500 mg via ORAL
  Filled 2024-04-22: qty 1

## 2024-04-22 MED ORDER — SODIUM CHLORIDE 0.9 % IV SOLN
2.0000 g | Freq: Two times a day (BID) | INTRAVENOUS | Status: DC
  Administered 2024-04-22 (×2): 2 g via INTRAVENOUS
  Filled 2024-04-22 (×2): qty 12.5

## 2024-04-22 MED ORDER — SERTRALINE HCL 50 MG PO TABS: 25.0000 mg | ORAL_TABLET | Freq: Every day | ORAL | Status: DC

## 2024-04-22 NOTE — Assessment & Plan Note (Addendum)
 Patient has history of chronic GI bleed from underlying active colon cancer Stable H&H  Continue oral iron  supplement, folic acid  and vitamin B12

## 2024-04-22 NOTE — H&P (Signed)
 History and Physical    Peggy Armstrong FMW:[id number] DOB: 1946-04-01 DOA: 04/22/2024  PCP: Peggy [name] DASEN, PA-C   Patient coming from: Home   Chief Complaint:  Chief Complaint  Patient presents with   Shortness of Breath   ED TRIAGE note:Pt from homve via [facility]. Pt reports SOB, wheezing, starting x2 days ago. Pt also c/o chronic back pain.   HPI:  Peggy Armstrong is a 78 y.o. 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  She also  noticed some edema of the lower extremities after stopped taking torsemide  at home. Patient reported dry cough and progressive worsening shortness of breath. Patient denies any fever, chill, abdominal pain and diarrhea.  Denies any chest pain and palpitation.  No other complaint at this time.  Per chart review when patient was admitted 3 weeks ago developed atrial flutter however due to low heart rate Cardizem  was on hold.  Previously Eliquis  has been discontinued in August/2025 due to GI bleed.    ED Course:  Presentation to ED patient is hemodynamically stable.  Afebrile.  O2 sat 92 to 97% room air. Lab, CBC unremarkable with stable H&H normal WBC count and chronically low platelet count 48. CMP showing elevated AST/ALT/ALP 44/60/25.  Normal bilirubin repeat.  ROS unremarkable. Elevated proBNP 4643. Elevated troponin 28.  Chest x-ray showing left lower lobe pneumonia and cardiomegaly.  In the ED patient received ceftriaxone , azithromycin and Lasix  40 mg.  DuoNeb  and Dilaudid .   Hospitalist consulted for further evaluation and management of community-acquired pneumonia, acute on chronic diastolic heart failure exacerbation and elevated troponin-due to demand ischemia    Significant labs in the ED: Lab Orders         Culture, blood (routine x 2) Call MD if unable to obtain prior to antibiotics being given         Respiratory (~20 pathogens) panel by PCR         CBC         Comprehensive metabolic panel         Pro Brain natriuretic peptide         Legionella Pneumophila Serogp 1 Ur Ag         Procalcitonin         Strep pneumoniae urinary antigen         CBC         Comprehensive metabolic panel       Review of Systems:  Review of Systems  Constitutional:  Negative for chills, fever, malaise/fatigue and weight loss.  Respiratory:  Negative for sputum production, shortness of breath and wheezing.        Dry cough  Cardiovascular:  Positive for leg swelling. Negative for chest pain, palpitations and orthopnea.  Gastrointestinal:  Negative for abdominal pain, blood in stool, constipation, diarrhea, heartburn, melena, nausea and vomiting.  Musculoskeletal:  Negative for myalgias.  Neurological:  Negative for dizziness and headaches.  Psychiatric/Behavioral:  The patient is not nervous/anxious.     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   Cellulitis of left arm 08/14/2023   Cholangitis (HCC)    Choledocholithiasis 03/22/2017    Chronic anticoagulation 02/12/2024   Chronic diastolic CHF (congestive heart failure) (HCC) 11/08/2019   Chronic low back pain 07/18/2023   CKD (chronic kidney disease) stage 3, GFR 30-59 ml/min (HCC) 06/09/2017   CKD (chronic kidney disease), stage III (HCC) 06/09/2017   Closed compression fracture of body of L1 vertebra (HCC) 02/10/2024   Closed compression fracture of L2 lumbar vertebra, initial encounter (HCC) 05/11/2023   Clotting disorder    right DVT     Congestive heart failure (HCC) 05/18/2023   Constipation 02/21/2024   Dehydration 05/12/2023   Depressive disorder 05/18/2023   Difficulty walking 05/19/2023   Disorder of bone 10/25/2023   Displacement of bone 10/25/2023   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   Duodenal mass    Duodenal ulcer    DVT (deep venous thrombosis) (HCC)    right DVT   Dysphagia 05/19/2023   Epigastric pain    Essential hypertension    Fall at home, initial encounter 10/21/2023   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   GAD (generalized anxiety disorder)    Gastric polyps 05/16/2023   Generalized weakness 05/12/2023   GI bleed 07/20/2023   Goals of care, counseling/discussion 02/20/2024   Gout    Gout    Hematemesis 11/05/2017   Hemorrhagic shock (HCC) 07/21/2023   History of depression 10/21/2023   History of DVT (deep vein thrombosis) 11/08/2019   History of GI bleed 10/21/2023   Hyperlipidemia 05/12/2023   Hypertension    Hypokalemia 07/03/2017   Hypothyroidism 05/12/2023   IDA (iron  deficiency anemia) 02/15/2024   Iron  deficiency anemia 08/02/2016   Left elbow avulsion fracture 10/21/2023   Left humeral fracture 10/21/2023   Long term current use of anticoagulant therapy 06/20/2023   Lumbar radiculopathy 12/15/2023   Macrocytic anemia 11/05/2017   Medication management 02/21/2024   Melena 11/05/2017   Morbid obesity (HCC) 11/08/2019   Mucosal abnormality of esophagus 02/14/2024   Muscle weakness 05/19/2023   Nausea &  vomiting    Need for assistance with personal care 05/21/2023   Need for immunization against influenza 07/18/2019   Need for vaccination 04/23/2018   Obesity with body mass index 30 or greater 10/25/2023   Occult blood in stools 05/16/2023   Pain 02/21/2024   Palliative care encounter 02/20/2024   Paroxysmal atrial fibrillation (HCC) 05/12/2023   Peri-ampullary neoplasm    Poor dentition 08/14/2016   RA (rheumatoid arthritis) (HCC)    Reduced mobility 06/20/2023   Restless leg 12/02/2022   Right shoulder pain 07/26/2018   Routine general medical examination at a health care facility 12/13/2019   S/P ERCP 05/05/2017   Severe anemia 02/10/2024   SIRS (systemic inflammatory response syndrome) (HCC) 03/22/2017   Symbolic dysfunction 05/19/2023   Unsteadiness on feet 05/21/2023   Upper GI bleed 06/20/2023    Past Surgical History:  Procedure Laterality Date   ABDOMINAL HYSTERECTOMY     BIOPSY  05/16/2023  Procedure: BIOPSY;  Surgeon: Aneita Gwendlyn DASEN, MD;  Location: [facility];  Service: Gastroenterology;;   CHOLECYSTECTOMY     ENDOSCOPIC RETROGRADE CHOLANGIOPANCREATOGRAPHY (ERCP) WITH PROPOFOL  N/A 06/15/2017   Procedure: ENDOSCOPIC RETROGRADE CHOLANGIOPANCREATOGRAPHY (ERCP) WITH PROPOFOL ;  Surgeon: Teressa Toribio SQUIBB, MD;  Location: [facility];  Service: Endoscopy;  Laterality: N/A;   ESOPHAGOGASTRODUODENOSCOPY N/A 02/14/2024   Procedure: EGD (ESOPHAGOGASTRODUODENOSCOPY);  Surgeon: Wilhelmenia Aloha Raddle., MD;  Location: [facility];  Service: Gastroenterology;  Laterality: N/A;   ESOPHAGOGASTRODUODENOSCOPY (EGD) WITH PROPOFOL  N/A 03/24/2017   Procedure: ESOPHAGOGASTRODUODENOSCOPY (EGD) WITH PROPOFOL ;  Surgeon: Legrand Victory LITTIE DOUGLAS, MD;  Location: [facility];  Service: Gastroenterology;  Laterality: N/A;   ESOPHAGOGASTRODUODENOSCOPY (EGD) WITH PROPOFOL  N/A 11/07/2017   Procedure: ESOPHAGOGASTRODUODENOSCOPY (EGD) WITH PROPOFOL ;  Surgeon: Teressa Toribio SQUIBB, MD;  Location: [facility];  Service: Endoscopy;  Laterality: N/A;   ESOPHAGOGASTRODUODENOSCOPY (EGD) WITH PROPOFOL  N/A 05/16/2023   Procedure: ESOPHAGOGASTRODUODENOSCOPY (EGD) WITH PROPOFOL ;  Surgeon: Aneita Gwendlyn DASEN, MD;  Location: [facility];  Service: Gastroenterology;  Laterality: N/A;   ESOPHAGOGASTRODUODENOSCOPY (EGD) WITH PROPOFOL  N/A 07/21/2023   Procedure: ESOPHAGOGASTRODUODENOSCOPY (EGD) WITH PROPOFOL ;  Surgeon: Charlanne Groom, MD;  Location: [facility];  Service: Gastroenterology;  Laterality: N/A;   EUS N/A 04/06/2017   Procedure: UPPER ENDOSCOPIC ULTRASOUND (EUS) LINEAR;  Surgeon: Teressa Toribio SQUIBB, MD;  Location: [facility];  Service: Endoscopy;  Laterality: N/A;  to evaluate duodenal mass   HEMOSTASIS CLIP PLACEMENT  07/21/2023   Procedure: HEMOSTASIS CLIP PLACEMENT;  Surgeon: Charlanne Groom, MD;  Location: [facility] ENDOSCOPY;  Service: Gastroenterology;;   HEMOSTASIS CONTROL  07/21/2023   Procedure: HEMOSTASIS CONTROL;  Surgeon: Charlanne Groom, MD;  Location[facility] END[facility]COPY;  Service: Gastroenterology;;   HERNIA REPAIR     IR ANGIOGRAM VISCERAL SELECTIVE  07/22/2023   IR BILIARY DRAIN PLACEMENT WITH CHOLANGIOGRAM  03/25/2017   IR CONVERT BILIARY DRAIN TO INT EXT BILIARY DRAIN  04/03/2017   IR EMBO ART  VEN HEMORR LYMPH EXTRAV  INC GUIDE ROADMAPPING  07/22/2023   IR US  GUIDE VASC ACCESS RIGHT  07/22/2023   POLYPECTOMY  05/16/2023   Procedure: POLYPECTOMY;  Surgeon: Aneita Gwendlyn DASEN, MD;  Location: [facility];  Service: Gastroenterology;;     reports that she quit smoking about 13 years ago. Her smoking use included cigarettes. She started smoking about 57 years ago. She has a 66 pack-year smoking history. She has been exposed to tobacco smoke. She has quit using smokeless tobacco. She reports that she does not drink alcohol and does not use drugs.  Allergies  Allergen Reactions   Codeine Nausea And Vomiting and Other (See Comments)    Made me very sick   Tape Other (See Comments)    Tears skin, as  it is VERY THIN!!  - surgical tape   Oxycodone  Other (See Comments)    Hallucinations     Family History  Problem Relation Age of Onset   Hypertension Mother    Prostate cancer Father    Colon cancer Father    Hypertension Brother    Rheum arthritis Maternal Grandmother    Diabetes Paternal Grandmother    Heart disease Paternal Grandfather    Colon cancer Child     Prior to Admission medications   Medication Sig Start Date End Date Taking? Authorizing Provider  acetaminophen  (TYLENOL ) 500 MG tablet Take 2 tablets (1,000 mg total) by mouth every 8 (eight) hours. 02/23/24   Krishnan, Gokul, MD  albuterol  (VENTOLIN  HFA) 108 (90 Base) MCG/ACT inhaler Inhale 1-2 puffs into the  lungs every 6 (six) hours as needed for wheezing or shortness of breath. Patient not taking: Reported on 04/18/2024    [provider]  allopurinol  (ZYLOPRIM ) 300 MG tablet Take 1 tablet (300 mg total) by mouth daily. 07/18/23   Rothfuss, Jacob T, PA-C  amiodarone  (PACERONE ) 200 MG tablet Take 1 tablet (200 mg total) by mouth 2 (two) times daily. Patient taking differently: Take 200 mg by mouth daily. 09/01/23   Croitoru, Mihai, MD  Ascorbic Acid (VITAMIN C PO) Take 250 mg by mouth in the morning.    [provider]  atorvastatin  (LIPITOR) 20 MG tablet Take 1 tablet (20 mg total) by mouth daily. 08/11/23     bisacodyl  (DULCOLAX) 10 MG suppository Place 1 suppository (10 mg total) rectally daily as needed for moderate constipation. Patient not taking: Reported on 04/18/2024 02/23/24   Verdene Purchase, MD  busPIRone  (BUSPAR ) 10 MG tablet TAKE 1 TABLET BY MOUTH TWICE A DAY Patient not taking: Reported on 04/11/2024 08/22/23   Rothfuss, Jacob T, PA-C  Calcium  Carb-Cholecalciferol  (CALCIUM  600 + D PO) Take 1 tablet by mouth in the morning.    [provider]  Cholecalciferol  (VITAMIN D3 PO) Take 1,000 mcg by mouth in the morning.    [provider]  cyanocobalamin  (VITAMIN B12) 1000 MCG  tablet Take 1,000 mcg by mouth daily.    [provider]  diltiazem  (CARDIZEM  CD) 120 MG 24 hr capsule Take 1 capsule (120 mg total) by mouth daily. 04/05/24 07/04/24  Vernon Ranks, MD  DULoxetine  (CYMBALTA ) 20 MG capsule TAKE 1 CAPSULE BY MOUTH EVERY DAY 10/23/23   Rothfuss, Jacob T, PA-C  ferrous sulfate  325 (65 FE) MG tablet Take 325 mg by mouth daily with breakfast.    [provider]  folic acid  (FOLVITE ) 1 MG tablet Take 1 tablet (1 mg total) by mouth daily. 05/18/23   Krishnan, Gokul, MD  ipratropium-albuterol  (DUONEB) 0.5-2.5 (3) MG/3ML SOLN Take 3 mLs by nebulization every 6 (six) hours as needed (for shortness of breath or wheezing). Patient not taking: Reported on 04/18/2024    [provider]  levothyroxine  (SYNTHROID ) 100 MCG tablet Take 1 tablet (100 mcg total) by mouth daily before breakfast. 07/18/23   Rothfuss, Jacob T, PA-C  lidocaine  (LIDODERM ) 5 % Place 1 patch onto the skin daily. Remove & Discard patch within 12 hours or as directed by MD 02/24/24   Krishnan, Gokul, MD  melatonin 3 MG TABS tablet Take 1 tablet (3 mg total) by mouth at bedtime as needed (insomnia). Patient taking differently: Take 3 mg by mouth at bedtime. 05/18/23   Krishnan, Gokul, MD  methocarbamol  (ROBAXIN ) 500 MG tablet TAKE 1 TABLET BY MOUTH EVERY 6 HOURS AS NEEDED FOR MUSCLE SPASMS. 10/03/23   Rothfuss, Jacob T, PA-C  ondansetron  (ZOFRAN ) 4 MG tablet Take 4 mg by mouth every 6 (six) hours as needed for nausea or vomiting.    [provider]  pantoprazole  (PROTONIX ) 40 MG tablet Take 1 tablet (40 mg total) by mouth 2 (two) times daily. Patient taking differently: Take 40 mg by mouth daily. 02/23/24   Krishnan, Gokul, MD  polyethylene glycol (MIRALAX  / GLYCOLAX ) 17 g packet Take 17 g by mouth daily. Patient not taking: Reported on 04/18/2024 02/24/24   Verdene Purchase, MD  Potassium Chloride  ER 20 MEQ TBCR Take 1 tablet by mouth daily. 12/29/23   [provider]   senna-docusate (SENOKOT-S) 8.6-50 MG tablet Take 2 tablets by mouth 2 (two) times daily. Patient  not taking: Reported on 04/11/2024 02/23/24   Krishnan, Gokul, MD  sertraline  (ZOLOFT ) 25 MG tablet Take 25 mg by mouth daily.    [provider]  sucralfate  (CARAFATE ) 1 g tablet Take 1 tablet (1 g total) by mouth 2 (two) times daily. Patient not taking: Reported on 04/11/2024 02/23/24   Krishnan, Gokul, MD  TIADYLT  ER 300 MG 24 hr capsule Take 300 mg by mouth See admin instructions. Give 1 capsule by mouth in the morning for atrial flutter IF SYSTOLIC IS LESS THAN 120 OR HEART LESS THAN 60 Patient not taking: Reported on 04/11/2024 03/25/24   [provider]  torsemide  (DEMADEX ) 20 MG tablet Take 1 tablet (20 mg total) by mouth daily for CHF. Patient not taking: Reported on 04/11/2024 01/26/24        Physical Exam: Vitals:   04/22/24 0038 04/22/24 0039 04/22/24 0145  BP: (!) 153/77  (!) 156/95  Pulse: 77  72  Resp: 18  20  Temp: 99.9 F (37.7 C)    TempSrc: Oral    SpO2: 92%  97%  Weight:  74.4 kg   Height:  5' (1.524 m)     Physical Exam Constitutional:      General: She is not in acute distress.    Appearance: She is well-developed. She is not ill-appearing.  Cardiovascular:     Rate and Rhythm: Normal rate and regular rhythm.  Pulmonary:     Breath sounds: No decreased breath sounds, wheezing, rhonchi or rales.  Musculoskeletal:     Cervical back: Neck supple.  Skin:    General: Skin is warm.     Capillary Refill: Capillary refill takes less than 2 seconds.  Neurological:     Mental Status: She is alert and oriented to person, place, and time.  Psychiatric:        Mood and Affect: Mood normal.      Labs on Admission: I have personally reviewed following labs and imaging studies  CBC: Recent Labs  Lab 04/17/24 1307 04/22/24 0124  WBC 7.6 7.8  NEUTROABS 6.6  --   HGB 9.6* 8.9*  HCT 32.4* 30.4*  MCV 93 93.5  PLT 41* 48*   Basic Metabolic  Panel: Recent Labs  Lab 04/17/24 1307 04/22/24 0124  NA 140 138  K 3.3* 3.5  CL 103 102  CO2 26 27  GLUCOSE 93 111*  BUN 20 16  CREATININE 0.95 0.80  CALCIUM  8.6* 8.3*   GFR: Estimated Creatinine Clearance: 52.2 mL/min (by C-G formula based on SCr of 0.8 mg/dL). Liver Function Tests: Recent Labs  Lab 04/22/24 0124  AST 44*  ALT 60*  ALKPHOS 185*  BILITOT 0.7  PROT 5.9*  ALBUMIN 3.3*   No results for input(s): LIPASE, AMYLASE in the last 168 hours. No results for input(s): AMMONIA in the last 168 hours. Coagulation Profile: No results for input(s): INR, PROTIME in the last 168 hours. Cardiac Enzymes: No results for input(s): CKTOTAL, CKMB, CKMBINDEX, TROPONINI, TROPONINIHS in the last 168 hours. BNP (last 3 results) Recent Labs    02/15/24 0503 03/12/24 0311  BNP 633.1* 130.7*   HbA1C: No results for input(s): HGBA1C in the last 72 hours. CBG: No results for input(s): GLUCAP in the last 168 hours. Lipid Profile: No results for input(s): CHOL, HDL, LDLCALC, TRIG, CHOLHDL, LDLDIRECT in the last 72 hours. Thyroid  Function Tests: No results for input(s): TSH, T4TOTAL, FREET4, T3FREE, THYROIDAB in the last 72 hours. Anemia Panel: No results for input(s): VITAMINB12, FOLATE,  FERRITIN, TIBC, IRON , RETICCTPCT in the last 72 hours. Urine analysis:    Component Value Date/Time   COLORURINE AMBER (A) 04/01/2024 1047   APPEARANCEUR CLOUDY (A) 04/01/2024 1047   LABSPEC >1.046 (H) 04/01/2024 1047   PHURINE 5.0 04/01/2024 1047   GLUCOSEU NEGATIVE 04/01/2024 1047   HGBUR NEGATIVE 04/01/2024 1047   BILIRUBINUR NEGATIVE 04/01/2024 1047   KETONESUR NEGATIVE 04/01/2024 1047   PROTEINUR 30 (A) 04/01/2024 1047   NITRITE POSITIVE (A) 04/01/2024 1047   LEUKOCYTESUR TRACE (A) 04/01/2024 1047    Radiological Exams on Admission: I have personally reviewed images DG Chest 2 View Result Date: 04/22/2024 EXAM: 2 VIEW(S)  XRAY OF THE CHEST 04/22/2024 01:06:54 AM COMPARISON: Chest x-ray 03/12/2024. CLINICAL HISTORY: SHOB upon standing earlier this evening. FINDINGS: LUNGS AND PLEURA: There is some faint mild patchy airspace opacities in the right upper lobe. No pulmonary edema. No pleural effusion. No pneumothorax. HEART AND MEDIASTINUM: The heart is enlarged. BONES AND SOFT TISSUES: No acute osseous abnormality. Chronic proximal left humeral fracture again seen. IMPRESSION: 1. Faint mild patchy airspace opacities in the right upper lobe suspicious for infection . 2. Enlarged heart. Electronically signed by: Greig Pique MD 04/22/2024 01:09 AM EDT RP Workstation: [id number]     Assessment/Plan: Principal Problem:   Community acquired pneumonia Active Problems:   Acute on chronic diastolic CHF (congestive heart failure) (HCC)   Duodenal adenocarcinoma (HCC)   Generalized anxiety disorder   Hypothyroidism   History of GI bleed   Chronic back pain   Thrombocytopenia   History of atrial flutter   Elevated troponin    Assessment and Plan: Community-acquired pneumonia -Presented to emergency department complaining of progressive worsening shortness of breath cough and bilateral lower extremity edema.  Presentation to ED patient is hemodynamically stable.  Afebrile.  O2 sat 92 to 97% room air. Lab, CBC unremarkable with stable H&H normal WBC count and chronically low platelet count 48. CMP showing elevated AST/ALT/ALP 44/60/25.  Normal bilirubin repeat.  ROS unremarkable. Elevated proBNP 4643. Elevated troponin 28. - Chest x-ray showing left lower lobe pneumonia and cardiomegaly. - In the ED patient received ceftriaxone , azithromycin and Lasix  40 mg.  DuoNeb and Dilaudid . -Patient never desatted below 88% however requiring oxygen O2 sat 94 to 96% on 2 L. - Given patient recently admitted 2 weeks ago there is risk for MRSA and Pseudomonas associated pneumonia.  Also patient is immunocompromised in the setting of  active colon cancer. - Broadening antibiotic to IV vancomycin  and IV cefepime . -Obtaining blood culture, sputum culture, urine Legionella and urine, urine strep antigen, procalcitonin level and checking respiratory panel - Need to follow-up with culture result for appropriate antibiotic guidance. - Continue supportive care.   Acute on chronic diastolic heart failure exacerbation -Patient reported currently amiodarone  however not taking the torsemide .  She has been noticing orthopnea dyspnea and bilateral lower extremity trace edema. -Patient is not clear about taking Cardizem  or not.  Per chart review when patient was admitted 3 weeks ago developed bradycardia and Cardizem  was on hold on discharge. - Based on the heart rate trend can resume Cardizem . - Elevated proBNP around 800.  Chest x-ray showing cardiomegaly and evidence for pneumonia. - In the ED patient received Lasix  40 mg. - Continue IV Lasix  20 mg twice daily.  Strict I's/O Daily weight and monitor urine output.  Obtaining echocardiogram.  History of duodenal cancer History of chronic GI bleed -History of renal cancer nonamenable for surgery.  Patient supposed to follow-up with oncology to  discuss further treatment plan include radiation therapy versus chemotherapy.  Chronic thrombocytopenia -Low platelet count 48.  Baseline is around 41-92.  Continue to monitor.  Chronic anemia History of GI bleed -Patient has history of chronic GI bleed from underlying active colon cancer.  Stable H&H 8.9 and 30.  Continue to monitor. - Continue oral iron  supplement, folic acid  and vitamin B12.  Chronic back pain -Continue Tylenol  as needed  History of atrial flutter/atrial fibrillation not on anticoagulation due to GI bleed Prolonged QTc interval -EKG showed normal sinus rhythm heart rate 79 prolonged QTc interval 504.  Patient unclear about taking Cardizem  however she is currently taking amiodarone  200 mg daily. - Holding Cardizem  for  now.  Based on heart rate trend can resume Cardizem .  Continue amiodarone  200 mg daily.  Given patient has previous history of GI bleed currently not on anticoagulation.  Elevated troponin-secondary demand ischemia -Elevated troponin 28.  EKG showing normal sinus rhythm heart rate 79.  There is no history of abnormality. -Demand ischemia in the setting of acute CHF exacerbation.  Will follow-up with second troponin level.  Generalized anxiety disorder -Continue duloxetine .  Unclear if patient is taking Zoloft  or not.  Hypothyroidism -Continue levothyroxine   DVT prophylaxis:  SCDs Code Status:  Full Code Diet: Heart healthy diet Family Communication:   Family was present at bedside, at the time of interview. Opportunity was given to ask question and all questions were answered satisfactorily.  Disposition Plan: Need to follow-up with sputum and blood culture results for antibiotic guidance.  Pending echocardiogram Consults: None indicated at this time Admission status:   Inpatient, Step Down Unit  Severity of Illness: The appropriate patient status for this patient is INPATIENT. Inpatient status is judged to be reasonable and necessary in order to provide the required intensity of service to ensure the patient's safety. The patient's presenting symptoms, physical exam findings, and initial radiographic and laboratory data in the context of their chronic comorbidities is felt to place them at high risk for further clinical deterioration. Furthermore, it is not anticipated that the patient will be medically stable for discharge from the hospital within 2 midnights of admission.   * I certify that at the point of admission it is my clinical judgment that the patient will require inpatient hospital care spanning beyond 2 midnights from the point of admission due to high intensity of service, high risk for further deterioration and high frequency of surveillance required.DEWAINE    Rashonda Warrior,  MD [facility]  How to contact the [facility] Attending or Consulting provider 7A - 7P or covering provider during after hours 7P -7A, for this patient.  Check the care team in [facility] and look for a) attending/consulting [facility] provider listed and b) the [facility] team listed Log into [email or web address] and use [place]'s universal password to access. If you do not have the password, please contact the hospital operator. Locate the [facility] provider you are looking for under [facility] and page to a number that you can be directly reached. If you still have difficulty reaching the provider, please page the [facility] (Director on Call) for the Hospitalists listed on amion for assistance.  04/22/2024, 4:43 AM

## 2024-04-22 NOTE — Assessment & Plan Note (Signed)
" >>  ASSESSMENT AND PLAN FOR HYPOTHYROIDISM WRITTEN ON 04/22/2024  9:18 AM BY BARBARANN NEST, MD  Continue levothyroxine   "

## 2024-04-22 NOTE — Progress Notes (Signed)
 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 Rads), GI bleeding (not on [facility]), chronic thrombocytopenia, anxiety/depression, hypothyroidism, and chronic HFpEF who presented on 10/27 with SOB.  She was found to have CAP without hypoxia.  Started on Ceftriaxone  and Azithromycin and given Lasix  and nebs.  Assessment and Plan:  Assessment & Plan Community acquired pneumonia Progressively worsening shortness of breath, cough, and bilateral lower extremity edema Afebrile, VSS, no hypoxia CBC unremarkable with chronically low but stable platelet count CMP showing elevated AST/ALT/ALP Elevated proBNP and troponin, mild Chest x-ray showing left lower lobe pneumonia and cardiomegaly Treated with ceftriaxone , azithromycin and Lasix  as well as DuoNebs and Dilaudid  (Cefepime  and Vanc for a brief period of time) Procalcitonin 0.13, RVP negative, strep pneumo negative Appears to be improved already Possible dc tomorrow if she remains clinically stable Acute on chronic diastolic CHF (congestive heart failure) (HCC) On amiodarone  but not taking torsemide  and has noticed orthopnea, dyspnea, and BLE trace edema Resume Cardizem  Elevated proBNP around 800 without apparent edema on CXR Given Lasix , will resume torsemide  Echocardiogram unremarkable Duodenal adenocarcinoma (HCC) Recent diagnosis, not amenable to surgery at this time based on overall poor mobility status Plans to follow-up with oncology to discuss further treatment plan include radiation therapy versus chemotherapy Dr. Ezzard is her doctor in [place], considering radiation, less likely chemotherapy F/u with Dr. Dasie from surgery in 1 month to revisit this discussion, as well - daughter does not  think this is an option Generalized anxiety disorder Continue duloxetine  Hypothyroidism Continue levothyroxine   History of GI bleed Patient has history of chronic GI bleed from underlying active colon cancer Stable H&H  Continue oral iron  supplement, folic acid  and vitamin B12. Chronic back pain Continue Tylenol  as needed  Thrombocytopenia Low platelet count 48 Baseline is around 41-92 Continue to monitor History of atrial flutter Resume Cardizem  Continue amiodarone   With history of GI bleed, currently not on anticoagulation Elevated troponin Minimally elevated troponin EKG unremarkable No chest pain Appears to be c/w demand ischemia in the setting of mild acute CHF exacerbation HLD (hyperlipidemia) Continue atorvastatin  Class 1 obesity due to excess calories with body mass index (BMI) of 32.0 to 32.9 in adult Body mass index is 32.03 kg/m.[name]  Weight loss should be encouraged Outpatient PCP/bariatric medicine f/u encouraged Significantly low or high BMI is associated with higher medical risk including morbidity and mortality     Consultants: PT OT  Procedures: None  Antibiotics: Azithromycin 10/27-31 Ceftriaxone  10/27-31    Subjective: She is feeling much better.  Still having some back pain.  I spoke with her daughter,  Her biggest concern is the pain in her back.  Pain is controlled in the hospital, gets up and moves better but then it comes back and she has nothing to take.  She has spoken with PCP and oncology, both think it is related to chronic pain + tumor.  She can't stay home long enough to get to appointments with oncology (has one in the morning).  Her daughter is hoping we could reach out to Dr. Ezzard to coordinate pain control.  Her daughter will try to reschedule to appointment for later this week.   Physical Exam: Vitals:   04/22/24 9473 04/22/24 0545 04/22/24 0851 04/22/24 0909  BP:  122/81  (!) 100/54  Pulse:  (!) 29 71 66  Resp:  (!) 26 (!) 21  18  Temp: 98.8 F (37.1 C)   98.8 F (37.1 C)  TempSrc: Oral   Oral  SpO2:  98% 94% 93%  Weight:      Height:        Intake/Output Summary (Last 24 hours) at 04/22/2024 0917 Last data filed at 04/22/2024 0720 Gross per 24 hour  Intake 743.1 ml  Output --  Net 743.1 ml   Filed Weights   04/22/24 0039  Weight: 74.4 kg    Exam:  General:  Appears calm and comfortable and is in NAD Eyes:  normal lids, iris ENT:  grossly normal hearing, lips & tongue, mmm Cardiovascular:  RRR, no m/r/g. No LE edema.  Respiratory:   CTA bilaterally with no wheezes/rales/rhonchi.  Normal respiratory effort. Abdomen:  soft, NT, ND Skin:  no rash or induration seen on limited exam Musculoskeletal:  grossly normal tone BUE/BLE, good ROM, no bony abnormality Psychiatric:  grossly normal mood and affect, speech fluent and appropriate, AOx3 Neurologic:  CN 2-12 grossly intact, moves all extremities in coordinated fashion  Data Reviewed: I have reviewed the patient's lab results since admission.  Pertinent labs for today include:  K+ 3.2 AP 169 Albumin 3.0 AST 40/ALT 54 HS troponin 28, 36 WBC 6.7 Hgb 8.5 Platelets 42   Family Communication: None present; I spoke with her daughter by telephone  Disposition: Status is: Inpatient Remains inpatient appropriate because: ongoing management   Planned Discharge Destination: Home    Time spent: 50 minutes  Author: Delon Herald, MD 04/22/2024 9:13 AM  For on call review [email or web address].

## 2024-04-22 NOTE — Assessment & Plan Note (Addendum)
 Recent diagnosis, not amenable to surgery at this time based on overall poor mobility status Plans to follow-up with oncology to discuss further treatment plan include radiation therapy versus chemotherapy Dr. Ezzard is her doctor in [place], considering radiation, less likely chemotherapy F/u with Dr. Dasie from surgery in 1 month to revisit this discussion, as well - daughter does not think this is an option

## 2024-04-22 NOTE — Assessment & Plan Note (Addendum)
 Minimally elevated troponin EKG unremarkable No chest pain Appears to be c/w demand ischemia in the setting of mild acute CHF exacerbation

## 2024-04-22 NOTE — TOC Initial Note (Signed)
 Transition of Care [facility]) - Initial/Assessment Note    Patient Details  Name: Peggy Armstrong MRN: [id number] Date of Birth: October 24, 1945  [facility]) CM/SW Contact:    Bascom Service, RN Phone Number: 04/22/2024, 12:49 PM  Clinical Narrative: Referral for ST SNF placement. Await PT recc.                  Expected Discharge Plan: Skilled Nursing Facility Barriers to Discharge: Continued Medical Work up   Patient Goals and CMS Choice Patient states their goals for this hospitalization and ongoing recovery are:: Home CMS Medicare.gov Compare Post Acute Care list provided to:: Patient Choice offered to / list presented to : Patient [place] ownership interest in [facility].provided to:: Patient    Expected Discharge Plan and Services                                              Prior Living Arrangements/Services                       Activities of Daily Living   ADL Screening (condition at time of admission) Independently performs ADLs?: No Does the patient have a NEW difficulty with bathing/dressing/toileting/self-feeding that is expected to last >3 days?: No Does the patient have a NEW difficulty with getting in/out of bed, walking, or climbing stairs that is expected to last >3 days?: No Does the patient have a NEW difficulty with communication that is expected to last >3 days?: No Is the patient deaf or have difficulty hearing?: No Does the patient have difficulty seeing, even when wearing glasses/contacts?: No Does the patient have difficulty concentrating, remembering, or making decisions?: No  Permission Sought/Granted                  Emotional Assessment              Admission diagnosis:  Community acquired pneumonia [J18.9] Acute on chronic diastolic congestive heart failure (HCC) [I50.33] Pneumonia of right upper lobe due to infectious organism [J18.9] Patient Active Problem List   Diagnosis Date Noted    Community acquired pneumonia 04/22/2024   History of atrial flutter 04/22/2024   Elevated troponin 04/22/2024   Class 1 obesity due to excess calories with body mass index (BMI) of 32.0 to 32.9 in adult 04/22/2024   Hyperkalemia 04/15/2024   Thrombocytopenia 04/15/2024   Chronic back pain 04/01/2024   Pain 02/21/2024   Constipation 02/21/2024   Medication management 02/21/2024   Goals of care, counseling/discussion 02/20/2024   Palliative care encounter 02/20/2024   Anemia due to blood loss, acute 02/15/2024   IDA (iron  deficiency anemia) 02/15/2024   Anemia 02/14/2024   Mucosal abnormality of esophagus 02/14/2024   Chronic anticoagulation 02/12/2024   Acute on chronic anemia 02/12/2024   Severe anemia 02/10/2024   Closed compression fracture of body of L1 vertebra (HCC) 02/10/2024   Lumbar radiculopathy 12/15/2023   Acquired hypothyroidism    Avulsion injury 10/25/2023   Disorder of bone 10/25/2023   Displacement of bone 10/25/2023   Obesity with body mass index 30 or greater 10/25/2023   Fall at home, initial encounter 10/21/2023   Left humeral fracture 10/21/2023   Left elbow avulsion fracture 10/21/2023   History of GI bleed 10/21/2023   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   History of depression 10/21/2023  Atrial fibrillation (HCC)    Back injury    DVT (deep venous thrombosis) (HCC)    Hypertension    Cellulitis of left arm 08/14/2023   Hemorrhagic shock (HCC) 07/21/2023   ABLA (acute blood loss anemia) 07/21/2023   Acute GI bleeding 07/21/2023   AKI (acute kidney injury) 07/21/2023   GI bleed 07/20/2023   Chronic low back pain 07/18/2023   Upper GI bleed 06/20/2023   Long term current use of anticoagulant therapy 06/20/2023   Reduced mobility 06/20/2023   Need for assistance with personal care 05/21/2023   Unsteadiness on feet 05/21/2023   Difficulty walking 05/19/2023   Dysphagia 05/19/2023   Muscle weakness 05/19/2023   Symbolic dysfunction  05/19/2023   Congestive heart failure (HCC) 05/18/2023   Depressive disorder 05/18/2023   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   Occult blood in stools 05/16/2023   Gastric polyps 05/16/2023   Generalized weakness 05/12/2023   Acute cystitis 05/12/2023   Dehydration 05/12/2023   Acute prerenal azotemia 05/12/2023   Paroxysmal atrial fibrillation (HCC) 05/12/2023   AAA (abdominal aortic aneurysm) 05/12/2023   Hyperlipidemia 05/12/2023   Generalized anxiety disorder 05/12/2023   Hypothyroidism 05/12/2023   Closed compression fracture of L2 lumbar vertebra, initial encounter (HCC) 05/11/2023   Restless leg 12/02/2022   Routine general medical examination at a health care facility 12/13/2019   Acute on chronic diastolic CHF (congestive heart failure) (HCC) 11/08/2019   Morbid obesity (HCC) 11/08/2019   History of DVT (deep vein thrombosis) 11/08/2019   Need for immunization against influenza 07/18/2019   Right shoulder pain 07/26/2018   Need for vaccination 04/23/2018   Duodenal ulcer    Hematemesis 11/05/2017   Macrocytic anemia 11/05/2017   Melena 11/05/2017   Nausea & vomiting    Hypokalemia 07/03/2017   Essential hypertension    Gout    Clotting disorder    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 [place] [address] Phone: [phone] Fax: [phone]  [facility]  -  [facility] [address], [address] [place] [address] Phone: [phone] Fax: [phone]     Social Drivers of Health (SDOH) Social History: SDOH Screenings   Food Insecurity: No Food Insecurity (04/22/2024)  Housing: Low Risk  (04/22/2024)  Transportation Needs: No Transportation Needs (04/22/2024)  Utilities: Not At Risk (04/22/2024)  Depression (PHQ2-9): Low Risk  (04/05/2024)  Financial Resource Strain: Low Risk  (12/02/2022)   Received from [facility]  Physical Activity: Unknown (04/05/2022)   Received from [facility]  Social Connections: Socially Isolated (04/22/2024)  Stress: No Stress Concern Present (04/05/2022)   Received from [facility]  Tobacco Use: Medium Risk (04/22/2024)   SDOH Interventions:     Readmission Risk Interventions    02/24/2024   11:28 AM 02/12/2024    2:43 PM 07/24/2023    5:23 PM  Readmission Risk Prevention Plan  Transportation  Screening Complete Complete Complete  PCP or Specialist Appt within 3-5 Days  Complete Complete  HRI or Home Care Consult  Complete Complete  Social Work Consult for Recovery Care Planning/Counseling   Complete  Palliative Care Screening  Not Applicable Not Applicable  Medication Review (RN Care Manager) Complete Complete Referral to Pharmacy  PCP or Specialist appointment within 3-5 days of discharge Complete    HRI or Home Care Consult Complete    SW Recovery Care/Counseling Consult Complete    Palliative Care Screening Not Applicable    Skilled Nursing Facility Complete

## 2024-04-22 NOTE — Progress Notes (Signed)
 Pharmacy Antibiotic Note  Peggy Armstrong is a 78 y.o. female admitted on 04/22/2024 with pneumonia.  PMH significant for carcinoma of the ampulla of Vater.  Pharmacy has been consulted for Vancomycin  and Cefepime  dosing.  Plan: Vancomycin  1500mg  IV x 1 followed by Vancomycin  750 mg IV Q 24 hrs. Goal AUC 400-550. Expected AUC: 517.6  SCr used: 0.8 Cefepime  2g IV q12h Follow renal function F/u culture results & sensitivities  Height: 5' (152.4 cm) Weight: 74.4 kg (164 lb 0.4 oz) IBW/kg (Calculated) : 45.5  Temp (24hrs), Avg:99.9 F (37.7 C), Min:99.9 F (37.7 C), Max:99.9 F (37.7 C)  Recent Labs  Lab 04/17/24 1307 04/22/24 0124  WBC 7.6 7.8  CREATININE 0.95 0.80    Estimated Creatinine Clearance: 52.2 mL/min (by C-G formula based on SCr of 0.8 mg/dL).    Allergies  Allergen Reactions   Codeine Nausea And Vomiting and Other (See Comments)    Made me very sick   Tape Other (See Comments)    Tears skin, as it is VERY THIN!!  - surgical tape   Oxycodone  Other (See Comments)    Hallucinations     Antimicrobials this admission: 10/27 Ceftriaxone  x 1 10/27 Azithromycin x 1 10/27 Cefepime  >> 10/27 Vancomycin  >>  Dose adjustments this admission:    Microbiology results: 10/27 BCx:      Thank you for allowing pharmacy to be a part of this patient's care.  Arvin Gauss, PharmD 04/22/2024 3:38 AM

## 2024-04-22 NOTE — Assessment & Plan Note (Signed)
 Continue levothyroxine 

## 2024-04-22 NOTE — Assessment & Plan Note (Signed)
 Continue Tylenol as needed

## 2024-04-22 NOTE — Progress Notes (Signed)
  Echocardiogram 2D Echocardiogram has been performed.  Tinnie FORBES Gosling RDCS 04/22/2024, 2:45 PM

## 2024-04-22 NOTE — ED Triage Notes (Signed)
 Pt from homve via [facility].  Pt reports SOB, wheezing, starting x2 days ago.  Pt also c/o chronic back pain.

## 2024-04-22 NOTE — Assessment & Plan Note (Addendum)
 Progressively worsening shortness of breath, cough, and bilateral lower extremity edema Afebrile, VSS, no hypoxia CBC unremarkable with chronically low but stable platelet count CMP showing elevated AST/ALT/ALP Elevated proBNP and troponin, mild Chest x-ray showing left lower lobe pneumonia and cardiomegaly Treated with ceftriaxone , azithromycin and Lasix  as well as DuoNebs and Dilaudid  (Cefepime  and Vanc for a brief period of time) Procalcitonin 0.13, RVP negative, strep pneumo negative Appears to be improved already Possible dc tomorrow if she remains clinically stable

## 2024-04-22 NOTE — Plan of Care (Signed)
 Problem: Education: Goal: Knowledge of General Education information will improve Description: Including pain rating scale, medication(s)/side effects and non-pharmacologic comfort measures 04/22/2024 1336 by Rosanne Elspeth HERO, RN Outcome: Progressing 04/22/2024 1131 by Rosanne Elspeth HERO, RN Outcome: Progressing   Problem: Health Behavior/Discharge Planning: Goal: Ability to manage health-related needs will improve 04/22/2024 1336 by Rosanne Elspeth HERO, RN Outcome: Progressing 04/22/2024 1131 by Rosanne Elspeth HERO, RN Outcome: Progressing   Problem: Clinical Measurements: Goal: Ability to maintain clinical measurements within normal limits will improve 04/22/2024 1336 by Rosanne Elspeth HERO, RN Outcome: Progressing 04/22/2024 1131 by Rosanne Elspeth HERO, RN Outcome: Progressing Goal: Will remain free from infection 04/22/2024 1336 by Rosanne Elspeth HERO, RN Outcome: Progressing 04/22/2024 1131 by Rosanne Elspeth HERO, RN Outcome: Progressing Goal: Diagnostic test results will improve 04/22/2024 1336 by Rosanne Elspeth HERO, RN Outcome: Progressing 04/22/2024 1131 by Rosanne Elspeth HERO, RN Outcome: Progressing Goal: Respiratory complications will improve 04/22/2024 1336 by Rosanne Elspeth HERO, RN Outcome: Progressing 04/22/2024 1131 by Rosanne Elspeth HERO, RN Outcome: Progressing Goal: Cardiovascular complication will be avoided 04/22/2024 1336 by Rosanne Elspeth HERO, RN Outcome: Progressing 04/22/2024 1131 by Rosanne Elspeth HERO, RN Outcome: Progressing   Problem: Activity: Goal: Risk for activity intolerance will decrease 04/22/2024 1336 by Rosanne Elspeth HERO, RN Outcome: Progressing 04/22/2024 1131 by Rosanne Elspeth HERO, RN Outcome: Progressing   Problem: Nutrition: Goal: Adequate nutrition will be maintained 04/22/2024 1336 by Rosanne Elspeth HERO, RN Outcome: Progressing 04/22/2024 1131 by Rosanne Elspeth HERO, RN Outcome:  Progressing   Problem: Coping: Goal: Level of anxiety will decrease 04/22/2024 1336 by Rosanne Elspeth HERO, RN Outcome: Progressing 04/22/2024 1131 by Rosanne Elspeth HERO, RN Outcome: Progressing   Problem: Elimination: Goal: Will not experience complications related to bowel motility 04/22/2024 1336 by Rosanne Elspeth HERO, RN Outcome: Progressing 04/22/2024 1131 by Rosanne Elspeth HERO, RN Outcome: Progressing Goal: Will not experience complications related to urinary retention 04/22/2024 1336 by Rosanne Elspeth HERO, RN Outcome: Progressing 04/22/2024 1131 by Rosanne Elspeth HERO, RN Outcome: Progressing   Problem: Pain Managment: Goal: General experience of comfort will improve and/or be controlled 04/22/2024 1336 by Rosanne Elspeth HERO, RN Outcome: Progressing 04/22/2024 1131 by Rosanne Elspeth HERO, RN Outcome: Progressing   Problem: Safety: Goal: Ability to remain free from injury will improve 04/22/2024 1336 by Rosanne Elspeth HERO, RN Outcome: Progressing 04/22/2024 1131 by Rosanne Elspeth HERO, RN Outcome: Progressing   Problem: Skin Integrity: Goal: Risk for impaired skin integrity will decrease 04/22/2024 1336 by Rosanne Elspeth HERO, RN Outcome: Progressing 04/22/2024 1131 by Rosanne Elspeth HERO, RN Outcome: Progressing   Problem: Education: Goal: Ability to demonstrate management of disease process will improve 04/22/2024 1336 by Rosanne Elspeth HERO, RN Outcome: Progressing 04/22/2024 1131 by Rosanne Elspeth HERO, RN Outcome: Progressing Goal: Ability to verbalize understanding of medication therapies will improve 04/22/2024 1336 by Rosanne Elspeth HERO, RN Outcome: Progressing 04/22/2024 1131 by Rosanne Elspeth HERO, RN Outcome: Progressing Goal: Individualized Educational Video(s) 04/22/2024 1336 by Rosanne Elspeth HERO, RN Outcome: Progressing 04/22/2024 1131 by Rosanne Elspeth HERO, RN Outcome: Progressing   Problem: Activity: Goal:  Capacity to carry out activities will improve 04/22/2024 1336 by Rosanne Elspeth HERO, RN Outcome: Progressing 04/22/2024 1131 by Rosanne Elspeth HERO, RN Outcome: Progressing   Problem: Cardiac: Goal: Ability to achieve and maintain adequate cardiopulmonary perfusion will improve 04/22/2024 1336 by Rosanne Elspeth HERO, RN Outcome: Progressing 04/22/2024 1131 by Rosanne Elspeth HERO, RN Outcome: Progressing   Problem: Activity: Goal: Ability to tolerate increased activity will improve 04/22/2024 1336 by  Rosanne Elspeth HERO, RN Outcome: Progressing 04/22/2024 1131 by Rosanne Elspeth HERO, RN Outcome: Progressing   Problem: Clinical Measurements: Goal: Ability to maintain a body temperature in the normal range will improve 04/22/2024 1336 by Rosanne Elspeth HERO, RN Outcome: Progressing 04/22/2024 1131 by Rosanne Elspeth HERO, RN Outcome: Progressing   Problem: Respiratory: Goal: Ability to maintain adequate ventilation will improve 04/22/2024 1336 by Rosanne Elspeth HERO, RN Outcome: Progressing 04/22/2024 1131 by Rosanne Elspeth HERO, RN Outcome: Progressing Goal: Ability to maintain a clear airway will improve 04/22/2024 1336 by Rosanne Elspeth HERO, RN Outcome: Progressing 04/22/2024 1131 by Rosanne Elspeth HERO, RN Outcome: Progressing

## 2024-04-22 NOTE — Assessment & Plan Note (Addendum)
 Continue duloxetine

## 2024-04-22 NOTE — Assessment & Plan Note (Addendum)
 On amiodarone  but not taking torsemide  and has noticed orthopnea, dyspnea, and BLE trace edema Resume Cardizem  Elevated proBNP around 800 without apparent edema on CXR Given Lasix , will resume torsemide  Echocardiogram unremarkable

## 2024-04-22 NOTE — Assessment & Plan Note (Addendum)
 Low platelet count 48 Baseline is around 41-92 Continue to monitor

## 2024-04-22 NOTE — Assessment & Plan Note (Addendum)
 Resume Cardizem  Continue amiodarone   With history of GI bleed, currently not on anticoagulation

## 2024-04-22 NOTE — ED Provider Notes (Signed)
 [place] [facility]MENT AT [facility] Provider Note   CSN: [id number] Arrival date & time: 04/22/24  9972     Patient presents with: Shortness of Breath   Peggy Armstrong is a 78 y.o. 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  Patient does not have COPD and is on any oxygen at home.  Also has chronic back pain, RA.  She is not currently anticoagulated for A-fib secondary to GI bleeding from her previously stated carcinoma.  Also endorses more fatigue this week..  No known ill contacts.    HPI     Prior to Admission medications   Medication Sig Start Date End Date Taking? Authorizing Provider  acetaminophen  (TYLENOL ) 500 MG tablet Take 2 tablets (1,000 mg total) by mouth every 8 (eight) hours. 02/23/24   Krishnan, Gokul, MD  albuterol  (VENTOLIN  HFA) 108 (90 Base) MCG/ACT inhaler Inhale 1-2 puffs into the lungs every 6 (six) hours as needed for wheezing or shortness of breath. Patient not taking: Reported on 04/18/2024    [provider]  allopurinol  (ZYLOPRIM ) 300 MG tablet Take 1 tablet (300 mg total) by mouth daily. 07/18/23   Rothfuss, Jacob T, PA-C  amiodarone  (PACERONE ) 200 MG tablet Take 1 tablet (200 mg total) by mouth 2 (two) times daily. Patient taking differently: Take 200 mg by mouth daily. 09/01/23   Croitoru, Mihai, MD  Ascorbic Acid (VITAMIN C PO) Take 250 mg by mouth in the morning.    [provider]  atorvastatin  (LIPITOR) 20 MG tablet Take 1 tablet (20 mg total) by mouth daily. 08/11/23     bisacodyl  (DULCOLAX) 10 MG suppository Place 1 suppository (10 mg total) rectally daily as needed for moderate constipation. Patient not taking: Reported on 04/18/2024 02/23/24   Krishnan, Gokul, MD  busPIRone  (BUSPAR ) 10 MG tablet TAKE 1 TABLET BY MOUTH  TWICE A DAY Patient not taking: Reported on 04/11/2024 08/22/23   Rothfuss, Jacob T, PA-C  Calcium  Carb-Cholecalciferol  (CALCIUM  600 + D PO) Take 1 tablet by mouth in the morning.    [provider]  Cholecalciferol  (VITAMIN D3 PO) Take 1,000 mcg by mouth in the morning.    [provider]  cyanocobalamin  (VITAMIN B12) 1000 MCG tablet Take 1,000 mcg by mouth daily.    [provider]  diltiazem  (CARDIZEM  CD) 120 MG 24 hr capsule Take 1 capsule (120 mg total) by mouth daily. 04/05/24 07/04/24  Vernon Ranks, MD  DULoxetine  (CYMBALTA ) 20 MG capsule TAKE 1 CAPSULE BY MOUTH EVERY DAY 10/23/23   Rothfuss, Jacob T, PA-C  ferrous sulfate  325 (65 FE) MG tablet Take 325 mg by mouth daily with breakfast.    [provider]  folic acid  (FOLVITE ) 1 MG tablet Take 1 tablet (1 mg total) by mouth daily. 05/18/23   Krishnan, Gokul, MD  ipratropium-albuterol  (DUONEB) 0.5-2.5 (3) MG/3ML SOLN Take 3 mLs by nebulization every 6 (six) hours as needed (for shortness of breath or wheezing). Patient not taking: Reported on 04/18/2024    [provider]  levothyroxine  (SYNTHROID ) 100 MCG tablet Take 1 tablet (100 mcg total) by mouth daily before breakfast. 07/18/23   Rothfuss, Jacob T, PA-C  lidocaine  (LIDODERM ) 5 % Place 1 patch onto the skin daily. Remove & Discard patch within 12 hours or as directed by MD 02/24/24   Krishnan, Gokul,  MD  melatonin 3 MG TABS tablet Take 1 tablet (3 mg total) by mouth at bedtime as needed (insomnia). Patient taking differently: Take 3 mg by mouth at bedtime. 05/18/23   Krishnan, Gokul, MD  methocarbamol  (ROBAXIN ) 500 MG tablet TAKE 1 TABLET BY MOUTH EVERY 6 HOURS AS NEEDED FOR MUSCLE SPASMS. 10/03/23   Rothfuss, Jacob T, PA-C  ondansetron  (ZOFRAN ) 4 MG tablet Take 4 mg by mouth every 6 (six) hours as needed for nausea or vomiting.    [provider]  pantoprazole  (PROTONIX ) 40 MG tablet Take 1 tablet (40 mg total) by mouth 2 (two) times  daily. Patient taking differently: Take 40 mg by mouth daily. 02/23/24   Krishnan, Gokul, MD  polyethylene glycol (MIRALAX  / GLYCOLAX ) 17 g packet Take 17 g by mouth daily. Patient not taking: Reported on 04/18/2024 02/24/24   Krishnan, Gokul, MD  Potassium Chloride  ER 20 MEQ TBCR Take 1 tablet by mouth daily. 12/29/23   [provider]  senna-docusate (SENOKOT-S) 8.6-50 MG tablet Take 2 tablets by mouth 2 (two) times daily. Patient not taking: Reported on 04/11/2024 02/23/24   Krishnan, Gokul, MD  sertraline  (ZOLOFT ) 25 MG tablet Take 25 mg by mouth daily.    [provider]  sucralfate  (CARAFATE ) 1 g tablet Take 1 tablet (1 g total) by mouth 2 (two) times daily. Patient not taking: Reported on 04/11/2024 02/23/24   Krishnan, Gokul, MD  TIADYLT  ER 300 MG 24 hr capsule Take 300 mg by mouth See admin instructions. Give 1 capsule by mouth in the morning for atrial flutter IF SYSTOLIC IS LESS THAN 120 OR HEART LESS THAN 60 Patient not taking: Reported on 04/11/2024 03/25/24   [provider]  torsemide  (DEMADEX ) 20 MG tablet Take 1 tablet (20 mg total) by mouth daily for CHF. Patient not taking: Reported on 04/11/2024 01/26/24       Allergies: Codeine, Tape, and Oxycodone     Review of Systems  Constitutional:  Positive for activity change, appetite change, chills and fatigue. Negative for fever.  HENT: Negative.    Respiratory:  Positive for cough and shortness of breath.   Cardiovascular: Negative.   Gastrointestinal: Negative.     Updated Vital Signs BP (!) 156/95   Pulse 72   Temp 99.9 F (37.7 C) (Oral)   Resp 20   Ht 5' (1.524 m)   Wt 74.4 kg   SpO2 97%   BMI 32.03 kg/m   Physical Exam Vitals and nursing note reviewed.  Constitutional:      Appearance: She is not ill-appearing or toxic-appearing.  HENT:     Head: Normocephalic and atraumatic.     Mouth/Throat:     Mouth: Mucous membranes are moist.     Pharynx: No oropharyngeal exudate or posterior  oropharyngeal erythema.  Eyes:     General:        Right eye: No discharge.        Left eye: No discharge.     Conjunctiva/sclera: Conjunctivae normal.  Cardiovascular:     Rate and Rhythm: Normal rate and regular rhythm.     Pulses: Normal pulses.  Pulmonary:     Effort: Pulmonary effort is normal. No respiratory distress.     Breath sounds: Examination of the left-middle field reveals wheezing. Wheezing present. No rales.  Chest:     Chest wall: No mass, tenderness or edema.  Abdominal:     General: Bowel sounds are normal. There is no distension.     Tenderness:  There is no abdominal tenderness.  Musculoskeletal:        General: No deformity.     Cervical back: Neck supple.     Right lower leg: No edema.     Left lower leg: No edema.  Skin:    General: Skin is warm and dry.     Capillary Refill: Capillary refill takes less than 2 seconds.  Neurological:     General: No focal deficit present.     Mental Status: She is alert and oriented to person, place, and time. Mental status is at baseline.  Psychiatric:        Mood and Affect: Mood normal.     (all labs ordered are listed, but only abnormal results are displayed) Labs Reviewed  CBC - Abnormal; Notable for the following components:      Result Value   RBC 3.25 (*)    Hemoglobin 8.9 (*)    HCT 30.4 (*)    MCHC 29.3 (*)    RDW 18.4 (*)    Platelets 48 (*)    All other components within normal limits  COMPREHENSIVE METABOLIC PANEL WITH GFR - Abnormal; Notable for the following components:   Glucose, Bld 111 (*)    Calcium  8.3 (*)    Total Protein 5.9 (*)    Albumin 3.3 (*)    AST 44 (*)    ALT 60 (*)    Alkaline Phosphatase 185 (*)    All other components within normal limits  PRO BRAIN NATRIURETIC PEPTIDE - Abnormal; Notable for the following components:   Pro Brain Natriuretic Peptide 4,643.0 (*)    All other components within normal limits  TROPONIN T, HIGH SENSITIVITY - Abnormal; Notable for the following  components:   Troponin T High Sensitivity 28 (*)    All other components within normal limits  TROPONIN T, HIGH SENSITIVITY    EKG: EKG Interpretation Date/Time:  Monday April 22 2024 00:41:03 EDT Ventricular Rate:  79 PR Interval:  175 QRS Duration:  112 QT Interval:  439 QTC Calculation: 504 R Axis:   -44  Text Interpretation: Sinus rhythm Borderline IVCD with LAD Abnormal R-wave progression, late transition Nonspecific T abnormalities, anterior leads Prolonged QT interval When compared with ECG of 03/12/2024, No significant change was found Confirmed by Raford Lenis ([id number]) on 04/22/2024 12:47:15 AM  Radiology: DG Chest 2 View Result Date: 04/22/2024 EXAM: 2 VIEW(S) XRAY OF THE CHEST 04/22/2024 01:06:54 AM COMPARISON: Chest x-ray 03/12/2024. CLINICAL HISTORY: SHOB upon standing earlier this evening. FINDINGS: LUNGS AND PLEURA: There is some faint mild patchy airspace opacities in the right upper lobe. No pulmonary edema. No pleural effusion. No pneumothorax. HEART AND MEDIASTINUM: The heart is enlarged. BONES AND SOFT TISSUES: No acute osseous abnormality. Chronic proximal left humeral fracture again seen. IMPRESSION: 1. Faint mild patchy airspace opacities in the right upper lobe suspicious for infection . 2. Enlarged heart. Electronically signed by: Greig Pique MD 04/22/2024 01:09 AM EDT RP Workstation: [id number]     Procedures   Medications Ordered in the ED  cefTRIAXone  (ROCEPHIN ) 1 g in sodium chloride  0.9 % 100 mL IVPB (0 g Intravenous Stopped 04/22/24 0213)  azithromycin (ZITHROMAX) tablet 500 mg (500 mg Oral Given 04/22/24 0135)  ipratropium-albuterol  (DUONEB) 0.5-2.5 (3) MG/3ML nebulizer solution 3 mL (3 mLs Nebulization Given 04/22/24 0209)  HYDROmorphone  (DILAUDID ) injection 0.5 mg (0.5 mg Intravenous Given 04/22/24 0216)  furosemide  (LASIX ) injection 40 mg (40 mg Intravenous Given 04/22/24 0302)    Clinical Course as of  04/22/24 0322  Mon Apr 22, 2024  0201  Patient was decompensating with her O2 sat to the 80s on RA; placed on 2L [place] by this provider with improvement in her sats to 95%.  [RS]  0202 CXR suggestive of PNA, abx initiated.   [RS]  0321 Consult to Dr. Sundil, hospitalist, who is amenable to plan for admission at this time. I appreciate her collaboration in the care of this patient.  [RS]    Clinical Course User Index [RS] Eylin Pontarelli, Pleasant SAUNDERS, PA-C                                 Medical Decision Making 78 y/o female with hx of afib not on anticoagulation who presents with SOB.   HTN on intake, VS otherwise normal. Cardiopulmonary exam as above with mild wheezing on the left. Abdominal exam is benign.   DDX includes but is not limited to PE, pleural effusion, PNA, CHF, dysrhythmia.     Amount and/or Complexity of Data Reviewed Labs: ordered.    Details: CBC without leukocytosis, anemia with hemoglobin of 8.9 near patient's baseline of 9.  Thrombocytopenic with platelets of 48 near patient's baseline over the last couple of weeks.  CMP with transaminitis with AST/ALT 40/60, normal renal function and electrolytes.  Troponin mildly elevated 28 and BNP significantly elevated at 4643. Radiology: ordered.    Details: Chest x-ray with right upper lobe patchy infiltrate concerning for infection.    ECG/medicine tests:     Details:   EKG with sinus rhythm with prolonged QT  Risk Prescription drug management. Decision regarding hospitalization.     Patient will require admission to the hospital for concomitant CHF exacerbation and likely new pneumonia.  Antibiotics ordered in the ED as well as diuresis.  Patient remains on 2 L supplemental oxygen via nasal cannula to maintain O2 saturations. Symptomatically significatnly improved on supplemental O2.   Consult to hospitalist as above. Juleah voiced understanding of her medical evaluation and treatment plan. Each of their questions answered to their expressed satisfaction.  She is  amenable to plan for admission at this time.   This chart was dictated using voice recognition software, Dragon. Despite the best efforts of this provider to proofread and correct errors, errors may still occur which can change documentation meaning.      Final diagnoses:  Acute on chronic diastolic congestive heart failure (HCC)  Pneumonia of right upper lobe due to infectious organism    ED Discharge Orders     None          Bobette Pleasant SAUNDERS DEVONNA 04/22/24 0322    Raford Lenis, MD 04/22/24 [phone]

## 2024-04-22 NOTE — Assessment & Plan Note (Signed)
 Continue atorvastatin 

## 2024-04-22 NOTE — Assessment & Plan Note (Signed)
 Body mass index is 32.03 kg/m.[name]  Weight loss should be encouraged Outpatient PCP/bariatric medicine f/u encouraged Significantly low or high BMI is associated with higher medical risk including morbidity and mortality

## 2024-04-22 NOTE — Plan of Care (Signed)

## 2024-04-23 ENCOUNTER — Other Ambulatory Visit (HOSPITAL_COMMUNITY): Payer: Self-pay

## 2024-04-23 ENCOUNTER — Inpatient Hospital Stay

## 2024-04-23 ENCOUNTER — Inpatient Hospital Stay: Admitting: Oncology

## 2024-04-23 DIAGNOSIS — J189 Pneumonia, unspecified organism: Secondary | ICD-10-CM | POA: Diagnosis not present

## 2024-04-23 LAB — CBC
HCT: 28.7 % — ABNORMAL LOW (ref 36.0–46.0)
Hemoglobin: 8.2 g/dL — ABNORMAL LOW (ref 12.0–15.0)
MCH: 27.4 pg (ref 26.0–34.0)
MCHC: 28.6 g/dL — ABNORMAL LOW (ref 30.0–36.0)
MCV: 96 fL (ref 80.0–100.0)
Platelets: 57 K/uL — ABNORMAL LOW (ref 150–400)
RBC: 2.99 MIL/uL — ABNORMAL LOW (ref 3.87–5.11)
RDW: 18.4 % — ABNORMAL HIGH (ref 11.5–15.5)
WBC: 5.7 K/uL (ref 4.0–10.5)
nRBC: 0 % (ref 0.0–0.2)

## 2024-04-23 LAB — BASIC METABOLIC PANEL WITH GFR
Anion gap: 9 (ref 5–15)
BUN: 22 mg/dL (ref 8–23)
CO2: 27 mmol/L (ref 22–32)
Calcium: 8.3 mg/dL — ABNORMAL LOW (ref 8.9–10.3)
Chloride: 101 mmol/L (ref 98–111)
Creatinine, Ser: 0.96 mg/dL (ref 0.44–1.00)
GFR, Estimated: >60 mL/min (ref >60)
Glucose, Bld: 87 mg/dL (ref 70–99)
Potassium: 3.2 mmol/L — ABNORMAL LOW (ref 3.5–5.1)
Sodium: 136 mmol/L (ref 135–145)

## 2024-04-23 MED ORDER — TORSEMIDE 20 MG PO TABS
20.0000 mg | ORAL_TABLET | Freq: Every day | ORAL | 0 refills | Status: DC
  Filled 2024-04-23: qty 30, 30d supply, fill #0

## 2024-04-23 MED ORDER — HYDROCODONE-ACETAMINOPHEN 5-325 MG PO TABS
1.0000 | ORAL_TABLET | Freq: Four times a day (QID) | ORAL | 0 refills | Status: DC | PRN
  Filled 2024-04-23: qty 30, 4d supply, fill #0

## 2024-04-23 MED ORDER — AMOXICILLIN-POT CLAVULANATE 875-125 MG PO TABS
1.0000 | ORAL_TABLET | Freq: Two times a day (BID) | ORAL | 0 refills | Status: DC
  Filled 2024-04-23: qty 6, 3d supply, fill #0

## 2024-04-23 MED ORDER — TRAMADOL HCL 50 MG PO TABS
100.0000 mg | ORAL_TABLET | Freq: Two times a day (BID) | ORAL | 0 refills | Status: DC | PRN
  Filled 2024-04-23: qty 20, 5d supply, fill #0

## 2024-04-23 MED ORDER — POTASSIUM CHLORIDE ER 20 MEQ PO TBCR
1.0000 | EXTENDED_RELEASE_TABLET | Freq: Every day | ORAL | 0 refills | Status: AC
  Filled 2024-04-23: qty 28, 28d supply, fill #0

## 2024-04-23 MED ORDER — BUSPIRONE HCL 5 MG PO TABS
5.0000 mg | ORAL_TABLET | Freq: Three times a day (TID) | ORAL | 0 refills | Status: DC | PRN
  Filled 2024-04-23: qty 30, 10d supply, fill #0

## 2024-04-23 MED ORDER — POTASSIUM CHLORIDE 20 MEQ PO PACK
60.0000 meq | PACK | Freq: Once | ORAL | Status: AC
  Administered 2024-04-23: 60 meq via ORAL
  Filled 2024-04-23: qty 3

## 2024-04-23 NOTE — Evaluation (Signed)
 Occupational Therapy Evaluation Patient Details Name: Peggy Armstrong MRN: [id number] DOB: 1945/07/14 Today's Date: 04/23/2024   History of Present Illness   Peggy Armstrong is a 78 yr old female admitted with community acquired pneumonia. 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 During the session today, she required CGA to min assist for tasks, including supine to sit, simulated lower body dressing, and sit to stand using a RW. She reported having low back pain which increased with activity; as such, she was unable to perform progressive out of bed activity this date. She reported use of a back brace when out of bed at home. She will benefit from further OT services to maximize her independence with self care tasks and to decrease the risk for restricted participation in meaningful activities. OT recommends she return home with her daughter at discharge, with resumption of home health therapy services.      If plan is discharge home, recommend the following:   A little help with walking and/or transfers;A little help with bathing/dressing/bathroom;Assistance with cooking/housework;Assist for transportation;Help with stairs or ramp for entrance     Functional Status Assessment   Patient has had a recent decline in their functional status and demonstrates the ability to make significant improvements in function in a reasonable and predictable amount of time.      Equipment Recommendations   None recommended by OT     Recommendations for Other Services         Precautions/Restrictions   Precautions Precautions: Back;Fall Restrictions Weight Bearing Restrictions Per Provider Order: No     Mobility Bed Mobility Overal bed mobility: Needs Assistance Bed Mobility: Supine to Sit, Sit to Supine     Supine to sit: Contact guard, Used rails, HOB elevated Sit to supine: Min assist        Transfers Overall transfer level: Needs assistance Equipment used: Rolling walker (2 wheels) Transfers: Sit to/from Stand Sit to Stand: Contact guard assist, From elevated surface                  Balance       Sitting balance - Comments: static sitting-good. dynamic sitting-fair+       Standing balance comment: CGA with RW          ADL either performed or assessed with clinical judgement   ADL Overall ADL's : Needs assistance/impaired Eating/Feeding: Independent;Sitting   Grooming: Set up;Sitting   Upper Body Bathing: Set up;Sitting   Lower Body Bathing: Minimal assistance;Sitting/lateral leans   Upper Body Dressing : Set up;Sitting   Lower Body Dressing: Sitting/lateral leans;Minimal assistance   Toilet Transfer: Contact guard assist;Minimal assistance;Rolling walker (2 wheels);Ambulation;Grab bars   Toileting- Clothing Manipulation and Hygiene: Contact guard assist;Minimal assistance;Sit to/from stand               Vision   Additional Comments: She correctly read the time depicted on the wall clock.            Pertinent Vitals/Pain Pain Assessment Pain Assessment: Faces Pain Score: 3  Pain Location:  L low back Pain Intervention(s): Limited activity within patient's tolerance, Monitored during session, Repositioned     Extremity/Trunk Assessment Upper Extremity Assessment Upper Extremity Assessment: Right hand dominant;LUE deficits/detail;RUE deficits/detail RUE Deficits / Details: Chronic  shoulder AROM limitations. Elbow and hand AROM WFL. Grip strength 4/5 LUE Deficits / Details: Chronic shoulder AROM limitations. Elbow and hand AROM WFL. Grip strength 4/5   Lower Extremity Assessment Lower Extremity Assessment: Generalized weakness;LLE deficits/detail;RLE deficits/detail RLE Deficits / Details: AROM WFL LLE Deficits / Details: AROM WFL       Communication Communication Communication: No apparent difficulties   Cognition Arousal: Alert   Cognition: No apparent impairments             OT - Cognition Comments: Oriented x4          Following commands: Intact       Cueing  General Comments   Cueing Techniques: Verbal cues              Home Living Family/patient expects to be discharged to:: Private residence Living Arrangements: Children (daughter who works from home) Available Help at Discharge: Family Type of Home: Apartment Home Access: Level entry     Home Layout: One level     Bathroom Shower/Tub: Tub/shower unit         Home Equipment: Tub bench;Wheelchair - manual;Other (comment);BSC/3in1;Hospital bed;Rolling Walker (2 wheels);Cane - single point Lexicographer)          Prior Functioning/Environment Prior Level of Function : Needs assist             Mobility Comments:  (She has been using a RW vs. manual wheelchair inside the home. Has been receiving home health OT and PT.) ADLs Comments:  (She was modified independent to independent with ADLs; her daughter managed household chores.)    OT Problem List: Decreased strength;Decreased range of motion;Decreased activity tolerance;Impaired balance (sitting and/or standing);Pain;Impaired UE functional use;Decreased knowledge of use of DME or AE   OT Treatment/Interventions: Self-care/ADL training;Therapeutic exercise;DME and/or AE instruction;Manual therapy;Therapeutic activities;Patient/family education;Balance training;Energy conservation      OT Goals(Current goals can be  found in the care plan section)   Acute Rehab OT Goals OT Goal Formulation: With patient Time For Goal Achievement: 05/07/24 Potential to Achieve Goals: Good ADL Goals Pt Will Perform Grooming: with set-up;with supervision;standing Pt Will Perform Lower Body Dressing: with supervision;sit to/from stand;sitting/lateral leans;with adaptive equipment Pt Will Transfer to Toilet: with supervision;ambulating Pt Will Perform Toileting - Clothing Manipulation and hygiene: with supervision;sit to/from stand   OT Frequency:  Min 2X/week       AM-PAC OT 6 Clicks Daily Activity     Outcome Measure Help from another person eating meals?: None Help from another person taking care of personal grooming?: A Little Help from another person toileting, which includes using toliet, bedpan, or urinal?: A Little Help from another person bathing (including washing, rinsing, drying)?: A Lot Help from another person to put on and taking off regular upper body clothing?: A Little Help from another person to put on and taking off regular lower body clothing?: A Little 6 Click Score: 18   End of Session Equipment Utilized During Treatment: Rolling walker (2 wheels) Nurse Communication: Mobility status  Activity Tolerance: Patient limited by pain Patient left: in bed;with call bell/phone within reach;with bed alarm set  OT Visit Diagnosis: Unsteadiness on feet (R26.81);Other abnormalities of gait and mobility (R26.89);Muscle weakness (generalized) (M62.81);Pain Pain - part of body:  (low back)  Time: 8498-8475 OT Time Calculation (min): 23 min Charges:  OT General Charges $OT Visit: 1 Visit OT Evaluation $OT Eval Moderate Complexity: 1 Mod OT Treatments $Therapeutic Activity: 8-22 mins    Peggy Armstrong JINNY Lesches, OTR/L 04/23/2024, 5:40 PM

## 2024-04-23 NOTE — TOC Transition Note (Signed)
 Transition of Care [facility]) - Discharge Note   Patient Details  Name: Peggy Armstrong MRN: [id number] Date of Birth: 09-30-1945  [facility] CM/SW Contact:  Bascom Service, RN Phone Number: 04/23/2024, 4:06 PM   Clinical Narrative: Per [name] d/c home with [facility] rep [facility] already active. PTAR @ d/c-confirmed address. No further CM needs.      Final next level of care: Home w Home Health Services Barriers to Discharge: No Barriers Identified   Patient Goals and CMS Choice Patient states their goals for this hospitalization and ongoing recovery are:: Home CMS Medicare.gov Compare Post Acute Care list provided to:: Patient Represenative (must comment) Choice offered to / list presented to : Adult Children [place] ownership interest in [facility].provided to:: Adult Children    Discharge Placement                       Discharge Plan and Services Additional resources added to the After Visit Summary for     Discharge Planning Services: CM Consult Post Acute Care Choice: Home Health                    HH Arranged: RN, PT, OT [facility] Agency: [facility] Date [facility] Agency Contacted: 04/23/24 Time HH Agency Contacted: 1606 Representative spoke with at [facility] Agency: [facility]  Social Drivers of Health (SDOH) Interventions SDOH Screenings   Food Insecurity: No Food Insecurity (04/22/2024)  Housing: Low Risk  (04/22/2024)  Transportation Needs: No Transportation Needs (04/22/2024)  Utilities: Not At Risk (04/22/2024)  Depression (PHQ2-9): Low Risk  (04/05/2024)  Financial Resource Strain: Low Risk  (12/02/2022)   Received from [facility]  Physical Activity: Unknown (04/05/2022)   Received from [facility]  Social Connections: Socially Isolated (04/22/2024)  Stress: No Stress Concern Present (04/05/2022)   Received from [facility]  Tobacco Use: Medium Risk (04/22/2024)     Readmission Risk Interventions    02/24/2024    11:28 AM 02/12/2024    2:43 PM 07/24/2023    5:23 PM  Readmission Risk Prevention Plan  Transportation Screening Complete Complete Complete  PCP or Specialist Appt within 3-5 Days  Complete Complete  HRI or Home Care Consult  Complete Complete  Social Work Consult for Recovery Care Planning/Counseling   Complete  Palliative Care Screening  Not Applicable Not Applicable  Medication Review Oceanographer) Complete Complete Referral to Pharmacy  PCP or Specialist appointment within 3-5 days of discharge Complete    HRI or Home Care Consult Complete    SW Recovery Care/Counseling Consult Complete    Palliative Care Screening Not Applicable    Skilled Nursing Facility Complete

## 2024-04-23 NOTE — Care Management CC44 (Signed)
 Condition Code 44 Documentation Completed  Patient Details  Name: Peggy Armstrong MRN: [id number] Date of Birth: 1946-04-06   Condition Code 44 given:  Yes Patient signature on Condition Code 44 notice:  Yes Documentation of 2 MD's agreement:  Yes Code 44 added to claim:  Yes    Bascom Service, RN 04/23/2024, 3:59 PM

## 2024-04-23 NOTE — Plan of Care (Signed)
  Problem: Education: Goal: Knowledge of General Education information will improve Description: Including pain rating scale, medication(s)/side effects and non-pharmacologic comfort measures Outcome: Progressing   Problem: Health Behavior/Discharge Planning: Goal: Ability to manage health-related needs will improve Outcome: Progressing   Problem: Clinical Measurements: Goal: Ability to maintain clinical measurements within normal limits will improve Outcome: Progressing   Problem: Nutrition: Goal: Adequate nutrition will be maintained Outcome: Progressing   Problem: Elimination: Goal: Will not experience complications related to bowel motility Outcome: Progressing Goal: Will not experience complications related to urinary retention Outcome: Progressing   Problem: Pain Managment: Goal: General experience of comfort will improve and/or be controlled Outcome: Progressing   Problem: Safety: Goal: Ability to remain free from injury will improve Outcome: Progressing   Problem: Skin Integrity: Goal: Risk for impaired skin integrity will decrease Outcome: Progressing   Problem: Education: Goal: Ability to demonstrate management of disease process will improve Outcome: Progressing Goal: Ability to verbalize understanding of medication therapies will improve Outcome: Progressing Goal: Individualized Educational Video(s) Outcome: Progressing   Problem: Cardiac: Goal: Ability to achieve and maintain adequate cardiopulmonary perfusion will improve Outcome: Progressing   Problem: Clinical Measurements: Goal: Ability to maintain a body temperature in the normal range will improve Outcome: Progressing   Problem: Respiratory: Goal: Ability to maintain adequate ventilation will improve Outcome: Progressing Goal: Ability to maintain a clear airway will improve Outcome: Progressing   Problem: Activity: Goal: Risk for activity intolerance will decrease Outcome: Not  Progressing   Problem: Coping: Goal: Level of anxiety will decrease Outcome: Not Progressing   Problem: Activity: Goal: Capacity to carry out activities will improve Outcome: Not Progressing   Problem: Activity: Goal: Ability to tolerate increased activity will improve Outcome: Not Progressing

## 2024-04-23 NOTE — Progress Notes (Signed)
 Patient anxious this morning, saying she can't breathe. SPO2 was 93% on room air, placed on 1LNC for comfort and added IS, patient able to get 350 volume on IS with short inhalation. Questions answered.

## 2024-04-23 NOTE — Assessment & Plan Note (Addendum)
 Patient has history of chronic GI bleed from underlying active colon cancer Stable H&H  Continue oral iron  supplement, folic acid  and vitamin B12

## 2024-04-23 NOTE — Assessment & Plan Note (Signed)
 Resume Cardizem  Continue amiodarone   With history of GI bleed, currently not on anticoagulation

## 2024-04-23 NOTE — Assessment & Plan Note (Signed)
 Progressively worsening shortness of breath, cough, and bilateral lower extremity edema Afebrile, VSS, no hypoxia CBC unremarkable with chronically low but stable platelet count CMP showing elevated AST/ALT/ALP Elevated proBNP and troponin, mild Chest x-ray showing left lower lobe pneumonia and cardiomegaly Treated with ceftriaxone , azithromycin and Lasix  as well as DuoNebs and Dilaudid  (Cefepime  and Vanc for a brief period of time) Procalcitonin 0.13, RVP negative, strep pneumo negative Appears to be improved already Possible dc tomorrow if she remains clinically stable

## 2024-04-23 NOTE — Assessment & Plan Note (Signed)
 Continue duloxetine

## 2024-04-23 NOTE — Assessment & Plan Note (Signed)
 Continue atorvastatin 

## 2024-04-23 NOTE — Care Management Obs Status (Signed)
 MEDICARE OBSERVATION STATUS NOTIFICATION   Patient Details  Name: Peggy Armstrong MRN: [id number] Date of Birth: [birth year]   Medicare Observation Status Notification Given:  Yes    MahabirNathanel, RN 04/23/2024, 3:59 PM

## 2024-04-23 NOTE — Assessment & Plan Note (Signed)
 On amiodarone  but not taking torsemide  and has noticed orthopnea, dyspnea, and BLE trace edema Resume Cardizem  Elevated proBNP around 800 without apparent edema on CXR Given Lasix , will resume torsemide  Echocardiogram unremarkable

## 2024-04-23 NOTE — Assessment & Plan Note (Signed)
 Minimally elevated troponin EKG unremarkable No chest pain Appears to be c/w demand ischemia in the setting of mild acute CHF exacerbation

## 2024-04-23 NOTE — Evaluation (Addendum)
 Physical Therapy Evaluation Patient Details Name: Peggy Armstrong MRN: [id number] DOB: [birth year] Today's Date: 04/23/2024  History of Present Illness  Peggy Armstrong is a 78 y.o. female admitted with community acquired pneumonia. 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 Pt from home, reports ind with transfers from hospital bed to w/c, ind with transfers to toilet, using RW for short distance ambulation at home, daughter is home but pt reports she is undergoing chemo and I don't want to put extra stress on her. Pt reports active with HHPT and using RW to ambulate with them as well. Pt reports wearing TLSO most of the time but not always, not wanting PT to order another one despite pt's not being present. Pt able to verbalize back precautions correctly and good self awareness, initiates log rolling without cues, min A to lift BLE back into bed when transitioning to supine. Pt needing min A to CGA for transfers from multiple surfaces, cues for hand placement. Pt amb with step through gait pattern initially, but with distance, returns to step-to gait pattern with LLE trailing and pt reporting fatigue. Pt able to void bladder on BSC, needing assistance to complete pericare and then ends in bed for back comfort. Pt on RA throughout eval with Spo2 91-96%. Recommend resume HHPT and 24/7 support from daughter. Pt currently with functional limitations due to the deficits listed below (see PT Problem List). Pt will benefit from acute  skilled PT to increase their independence and safety with mobility to allow discharge.           If plan is discharge home, recommend the following: A little help with walking and/or transfers;Assistance with cooking/housework;Assist for transportation;A little help with bathing/dressing/bathroom   Can travel by private vehicle        Equipment Recommendations None recommended by PT  Recommendations for Other Services       Functional Status Assessment Patient has had a recent decline in their functional status and demonstrates the ability to make significant improvements in function in a reasonable and predictable amount of time.     Precautions / Restrictions Precautions Precautions: Back;Fall Recall of Precautions/Restrictions: Intact Restrictions Weight Bearing Restrictions Per Provider Order: No      Mobility  Bed Mobility Overal bed mobility: Needs Assistance Bed Mobility: Rolling, Sidelying to Sit, Sit to Sidelying Rolling: Contact guard assist, Used rails Sidelying to sit: Contact guard assist, Used rails     Sit to sidelying: Min assist, Used rails General bed mobility comments: pt self initiates log roll and able to verbalize back precautions, CGA for rolling and powering up to sitting, min A to lift BLE back into bed as pt reports her hospital bed is lower to the ground    Transfers Overall transfer level: Needs assistance Equipment used: Rolling walker (2 wheels) Transfers: Sit to/from Stand Sit to Stand: Contact guard assist, Min assist           General transfer comment: CGA to min A to power up and steady with transfers from bed, BSC and recliner    Ambulation/Gait Ambulation/Gait assistance: Contact guard assist Gait Distance (  Feet): 50 Feet (x2) Assistive device: Rolling walker (2 wheels) Gait Pattern/deviations: Step-to pattern, Step-through pattern, Decreased stance time - left Gait velocity: decreased     General Gait Details: step  through gait pattern, pt reporting leg length discrepency, decreased heel-toe pattern, with distance and fatigue pt with more step-to gait pattern with LLE trailing and decreasead heel-toe pattern  Stairs            Wheelchair Mobility     Tilt Bed    Modified Rankin (Stroke Patients Only)       Balance Overall balance assessment: Needs assistance Sitting-balance support: Feet supported Sitting balance-Leahy Scale: Fair Sitting balance - Comments: static sitting, not challenged   Standing balance support: Reliant on assistive device for balance, Bilateral upper extremity supported Standing balance-Leahy Scale: Poor                               Pertinent Vitals/Pain Pain Assessment Pain Assessment: Faces Faces Pain Scale: Hurts even more Pain Location: back Pain Descriptors / Indicators: Aching, Sore Pain Intervention(s): Limited activity within patient's tolerance, Monitored during session, Repositioned    Home Living Family/patient expects to be discharged to:: Private residence Living Arrangements: Children Available Help at Discharge: Family;Available 24 hours/day Type of Home: Apartment Home Access: Level entry       Home Layout: One level Home Equipment: Grab bars - tub/shower;Rolling Walker (2 wheels);Cane - single point;Tub bench;BSC/3in1;Wheelchair - manual;Hospital bed Additional Comments: Pt lives with daughter who works from home    Prior Function Prior Level of Function : Needs assist             Mobility Comments: pt reports mobilizing in w/c due to daughter undergoing cancer treatment I don't want to put extra stress on her, using RW with HHPT at home ADLs Comments: pt reports ind with toileting, dressing, sponge bath     Extremity/Trunk Assessment   Upper Extremity Assessment Upper Extremity Assessment: Defer to OT evaluation    Lower Extremity Assessment Lower Extremity Assessment: Generalized weakness (AROM WFL,  strength grossly 3+/5, denies numbness/tingling)    Cervical / Trunk Assessment Cervical / Trunk Assessment: Kyphotic  Communication   Communication Communication: No apparent difficulties    Cognition Arousal: Alert Behavior During Therapy: WFL for tasks assessed/performed   PT - Cognitive impairments: No apparent impairments                         Following commands: Intact       Cueing Cueing Techniques: Verbal cues     General Comments      Exercises     Assessment/Plan    PT Assessment Patient needs continued PT services  PT Problem List Decreased strength;Decreased activity tolerance;Decreased balance;Decreased mobility;Pain       PT Treatment Interventions DME instruction;Gait training;Functional mobility training;Therapeutic activities;Therapeutic exercise;Balance training;Neuromuscular re-education;Patient/family education    PT Goals (Current goals can be found in the Care Plan section)  Acute Rehab PT Goals Patient Stated Goal: return home with daughter assisting as needed PT Goal Formulation: With patient Time For Goal Achievement: 05/07/24 Potential to Achieve Goals: Good    Frequency Min 3X/week     Co-evaluation               AM-PAC PT 6 Clicks Mobility  Outcome Measure Help needed turning from your back to your side while in a flat bed without using bedrails?: A Little  Help needed moving from lying on your back to sitting on the side of a flat bed without using bedrails?: A Little Help needed moving to and from a bed to a chair (including a wheelchair)?: A Little Help needed standing up from a chair using your arms (e.g., wheelchair or bedside chair)?: A Little Help needed to walk in hospital room?: A Little Help needed climbing 3-5 steps with a railing? : A Lot 6 Click Score: 17    End of Session Equipment Utilized During Treatment: Gait belt Activity Tolerance: Patient tolerated treatment well Patient left: in  bed;with call bell/phone within reach;with bed alarm set Nurse Communication: Mobility status PT Visit Diagnosis: Muscle weakness (generalized) (M62.81);Difficulty in walking, not elsewhere classified (R26.2) Pain - part of body:  (back)    Time: 8760-8684 PT Time Calculation (min) (ACUTE ONLY): 36 min   Charges:   PT Evaluation $PT Eval Moderate Complexity: 1 Mod PT Treatments $Gait Training: 8-22 mins PT General Charges $$ ACUTE PT VISIT: 1 Visit         Tori Annamarie Yamaguchi PT, DPT 04/23/24, 1:42 PM

## 2024-04-23 NOTE — Assessment & Plan Note (Signed)
 Low platelet count 48 Baseline is around 41-92 Continue to monitor

## 2024-04-23 NOTE — Plan of Care (Signed)
 Problem: Education: Goal: Knowledge of General Education information will improve Description: Including pain rating scale, medication(s)/side effects and non-pharmacologic comfort measures 04/23/2024 1658 by Rosanne Elspeth HERO, RN Outcome: Adequate for Discharge 04/23/2024 [phone] by Rosanne Elspeth HERO, RN Outcome: Progressing   Problem: Health Behavior/Discharge Planning: Goal: Ability to manage health-related needs will improve 04/23/2024 1658 by Rosanne Elspeth HERO, RN Outcome: Adequate for Discharge 04/23/[phone] by Rosanne Elspeth HERO, RN Outcome: Progressing   Problem: Clinical Measurements: Goal: Ability to maintain clinical measurements within normal limits will improve 04/23/2024 1658 by Rosanne Elspeth HERO, RN Outcome: Adequate for Discharge 04/23/2024 [phone] by Rosanne Elspeth HERO, RN Outcome: Progressing Goal: Will remain free from infection Outcome: Adequate for Discharge Goal: Diagnostic test results will improve Outcome: Adequate for Discharge Goal: Respiratory complications will improve Outcome: Adequate for Discharge Goal: Cardiovascular complication will be avoided Outcome: Adequate for Discharge   Problem: Activity: Goal: Risk for activity intolerance will decrease 04/23/2024 1658 by Rosanne Elspeth HERO, RN Outcome: Adequate for Discharge 04/23/2024 [phone] by Rosanne Elspeth HERO, RN Outcome: Not Progressing   Problem: Nutrition: Goal: Adequate nutrition will be maintained 04/23/2024 1658 by Rosanne Elspeth HERO, RN Outcome: Adequate for Discharge 04/23/2024 [phone] by Rosanne Elspeth HERO, RN Outcome: Progressing   Problem: Coping: Goal: Level of anxiety will decrease 04/23/2024 1658 by Rosanne Elspeth HERO, RN Outcome: Adequate for Discharge 04/23/2024 [phone] by Rosanne Elspeth HERO, RN Outcome: Not Progressing   Problem: Elimination: Goal: Will not experience complications related to bowel motility 04/23/2024 1658 by Rosanne Elspeth HERO,  RN Outcome: Adequate for Discharge 04/23/2024 [phone] by Rosanne Elspeth HERO, RN Outcome: Progressing Goal: Will not experience complications related to urinary retention 04/23/2024 1658 by Rosanne Elspeth HERO, RN Outcome: Adequate for Discharge 04/23/2024 [phone] by Rosanne Elspeth HERO, RN Outcome: Progressing   Problem: Pain Managment: Goal: General experience of comfort will improve and/or be controlled 04/23/2024 1658 by Rosanne Elspeth HERO, RN Outcome: Adequate for Discharge 04/23/[phone] by Rosanne Elspeth HERO, RN Outcome: Progressing   Problem: Safety: Goal: Ability to remain free from injury will improve 04/23/2024 1658 by Rosanne Elspeth HERO, RN Outcome: Adequate for Discharge 04/23/2024 9178 by Rosanne Elspeth HERO, RN Outcome: Progressing   Problem: Skin Integrity: Goal: Risk for impaired skin integrity will decrease 04/23/2024 1658 by Rosanne Elspeth HERO, RN Outcome: Adequate for Discharge 04/23/2024 [phone] by Rosanne Elspeth HERO, RN Outcome: Progressing   Problem: Education: Goal: Ability to demonstrate management of disease process will improve 04/23/2024 1658 by Rosanne Elspeth HERO, RN Outcome: Adequate for Discharge 04/23/2024 [phone] by Rosanne Elspeth HERO, RN Outcome: Progressing Goal: Ability to verbalize understanding of medication therapies will improve 04/23/2024 1658 by Rosanne Elspeth HERO, RN Outcome: Adequate for Discharge 04/23/[phone] by Rosanne Elspeth HERO, RN Outcome: Progressing Goal: Individualized Educational Video(s) 04/23/2024 1658 by Rosanne Elspeth HERO, RN Outcome: Adequate for Discharge 04/23/2024 [phone] by Rosanne Elspeth HERO, RN Outcome: Progressing   Problem: Activity: Goal: Capacity to carry out activities will improve 04/23/2024 1658 by Rosanne Elspeth HERO, RN Outcome: Adequate for Discharge 04/23/[phone] by Rosanne Elspeth HERO, RN Outcome: Not Progressing   Problem: Cardiac: Goal: Ability to achieve and  maintain adequate cardiopulmonary perfusion will improve 04/23/2024 1658 by Rosanne Elspeth HERO, RN Outcome: Adequate for Discharge 04/23/2024 [phone] by Rosanne Elspeth HERO, RN Outcome: Progressing   Problem: Activity: Goal: Ability to tolerate increased activity will improve 04/23/2024 1658 by Rosanne Elspeth HERO, RN Outcome: Adequate for Discharge 04/23/2024 [phone] by Rosanne Elspeth HERO, RN Outcome: Not Progressing   Problem:  Clinical Measurements: Goal: Ability to maintain a body temperature in the normal range will improve 04/23/2024 1658 by Rosanne Elspeth HERO, RN Outcome: Adequate for Discharge 04/23/2024 [phone] by Rosanne Elspeth HERO, RN Outcome: Progressing   Problem: Respiratory: Goal: Ability to maintain adequate ventilation will improve 04/23/2024 1658 by Rosanne Elspeth HERO, RN Outcome: Adequate for Discharge 04/23/[phone] by Rosanne Elspeth HERO, RN Outcome: Progressing Goal: Ability to maintain a clear airway will improve 04/23/2024 1658 by Rosanne Elspeth HERO, RN Outcome: Adequate for Discharge 04/23/[phone] by Rosanne Elspeth HERO, RN Outcome: Progressing

## 2024-04-23 NOTE — Assessment & Plan Note (Signed)
 Continue levothyroxine 

## 2024-04-23 NOTE — Assessment & Plan Note (Addendum)
 Body mass index is 32.59 kg/m.[name]  Weight loss should be encouraged Outpatient PCP/bariatric medicine f/u encouraged Significantly low or high BMI is associated with higher medical risk including morbidity and mortality

## 2024-04-23 NOTE — Assessment & Plan Note (Addendum)
 Continue Tylenol  as needed  Her daughter is concerned that pain control is part of what is leading to recurrent hospitalizations for her - she doesn't have time to follow up before she runs out of medication and ends up rehospitalized Will try to arrange for her to follow up with Dr. Ezzard later this week to prevent that occurrence again

## 2024-04-23 NOTE — Progress Notes (Signed)
 Heart Failure Navigator Progress Note  Assessed for Heart & Vascular TOC clinic readiness.  Patient does not meet criteria due to EF 60-65%, has a scheduled [facility] appointment on 04/26/2024. No HF TOC. .   Navigator will sign off at this time.    Stephane Haddock, BSN, Scientist, Clinical (histocompatibility And Immunogenetics) Only

## 2024-04-23 NOTE — Assessment & Plan Note (Signed)
" >>  ASSESSMENT AND PLAN FOR HYPOTHYROIDISM WRITTEN ON 04/23/2024  9:20 AM BY BARBARANN NEST, MD  Continue levothyroxine   "

## 2024-04-23 NOTE — Hospital Course (Signed)
 78yo with h/o chronic LBP, atrial flutter, duodenal adenocarcinoma (not a surgical candidate, deciding about chemo vs. Rads), GI bleeding (not on [facility]), chronic thrombocytopenia, anxiety/depression, hypothyroidism, and chronic HFpEF who presented on 10/27 with SOB. She was found to have CAP without hypoxia. Started on Ceftriaxone  and Azithromycin and given Lasix  and nebs.

## 2024-04-23 NOTE — Progress Notes (Signed)
 Discharge meds in a secure bag delivered to patient in room by this RN

## 2024-04-23 NOTE — Discharge Summary (Signed)
 Physician Discharge Summary   Patient: Peggy Armstrong MRN: [id number] DOB: [birth year]  Admit date:     04/22/2024  Discharge date: 04/23/24  Discharge Physician: Peggy Armstrong   PCP: Peggy [name] DASEN, PA-C   Recommendations at discharge:   Resume home health physical and occupational therapy at the time of discharge Home oxygen is not indicated at this time Continue Augmentin for 3 more days starting tomorrow (10/29) Resume torsemide  with potassium daily Take buspirone  as needed for anxiety You are being prescribed a limited number of narcotic pain pills; take as directed and do not drive or make important decisions while taking this medication  Follow up with Dr. Ezzard later this week, call for an appointment Follow up with PA Peggy Armstrong in 1-2 weeks  Discharge Diagnoses: Principal Problem:   Community acquired pneumonia Active Problems:   Acute on chronic diastolic CHF (congestive heart failure) (HCC)   Duodenal adenocarcinoma (HCC)   HLD (hyperlipidemia)   Generalized anxiety disorder   Hypothyroidism   History of GI bleed   Chronic back pain   Thrombocytopenia   History of atrial flutter   Elevated troponin   Class 1 obesity due to excess calories with body mass index (BMI) of 32.0 to 32.9 in adult    Hospital Course: 78yo with h/o chronic LBP, atrial flutter, duodenal adenocarcinoma (not a surgical candidate, deciding about chemo vs. Rads), GI bleeding (not on [facility]), chronic thrombocytopenia, anxiety/depression, hypothyroidism, and chronic HFpEF who presented on 10/27 with SOB. She was found to have CAP without hypoxia. Started on Ceftriaxone  and Azithromycin and given Lasix  and nebs.   Assessment and Plan:  Assessment & Plan Community acquired pneumonia Progressively worsening shortness of breath, cough, and bilateral lower extremity edema Afebrile, VSS, no hypoxia CBC unremarkable with chronically low but stable platelet count CMP showing elevated  AST/ALT/ALP Elevated proBNP and troponin, mild Chest x-ray showing left lower lobe pneumonia and cardiomegaly Treated with ceftriaxone , azithromycin and Lasix  as well as DuoNebs and Dilaudid  (Cefepime  and Vanc for a brief period of time) Procalcitonin 0.13, RVP negative, strep pneumo negative Appears to be improved already Possible dc tomorrow if she remains clinically stable Acute on chronic diastolic CHF (congestive heart failure) (HCC) On amiodarone  but not taking torsemide  and has noticed orthopnea, dyspnea, and BLE trace edema Resume Cardizem  Elevated proBNP around 800 without apparent edema on CXR Given Lasix , will resume torsemide  Echocardiogram unremarkable Duodenal adenocarcinoma (HCC) Recent diagnosis, not amenable to surgery at this time based on overall poor mobility status Plans to follow-up with oncology to discuss further treatment plan include radiation therapy versus chemotherapy Dr. Ezzard is her doctor in [place], considering radiation, less likely chemotherapy F/u with Dr. Dasie from surgery if appropriate to revisit this discussion, as well - daughter does not think this is an option  Generalized anxiety disorder Continue duloxetine  Hypothyroidism Continue levothyroxine   History of GI bleed Patient has history of chronic GI bleed from underlying active colon cancer Stable H&H  Continue oral iron  supplement, folic acid  and vitamin B12 Chronic back pain Continue Tylenol  as needed  Her daughter is concerned that pain control is part of what is leading to recurrent hospitalizations for her - she doesn't have time to follow up before she runs out of medication and ends up rehospitalized Will try to arrange for her to follow up with Dr. Ezzard later this week to prevent that occurrence again Thrombocytopenia Low platelet count 48 Baseline is around 41-92 Continue to monitor History of atrial flutter Resume Cardizem   Continue amiodarone   With history of GI bleed,  currently not on anticoagulation Elevated troponin Minimally elevated troponin EKG unremarkable No chest pain Appears to be c/w demand ischemia in the setting of mild acute CHF exacerbation HLD (hyperlipidemia) Continue atorvastatin  Class 1 obesity due to excess calories with body mass index (BMI) of 32.0 to 32.9 in adult Body mass index is 32.59 kg/m.Peggy Armstrong  Weight loss should be encouraged Outpatient PCP/bariatric medicine f/u encouraged Significantly low or high BMI is associated with higher medical risk including morbidity and mortality     Consultants: PT OT   Procedures: Echocardiogram 10/27   Antibiotics: Azithromycin 10/27-28 Ceftriaxone  10/27-28 Augmentin 10/29-31    Pain control - [place]  Controlled Substance Reporting System database was reviewed. and patient was instructed, not to drive, operate heavy machinery, perform activities at heights, swimming or participation in water activities or provide baby-sitting services while on Pain, Sleep and Anxiety Medications; until their outpatient Physician has advised to do so again. Also recommended to not to take more than prescribed Pain, Sleep and Anxiety Medications.   Disposition: Home Diet recommendation:  Cardiac diet DISCHARGE MEDICATION: Allergies as of 04/23/2024       Reactions   Codeine Nausea And Vomiting, Other (See Comments)   Made me very sick   Tape Other (See Comments)   Tears skin, as it is VERY THIN!!  - surgical tape   Oxycodone  Other (See Comments)   Hallucinations         Medication List     STOP taking these medications    folic acid  1 MG tablet Commonly known as: FOLVITE    ipratropium-albuterol  0.5-2.5 (3) MG/3ML Soln Commonly known as: DUONEB   sertraline  25 MG tablet Commonly known as: ZOLOFT    sucralfate  1 g tablet Commonly known as: CARAFATE        TAKE these medications    acetaminophen  500 MG tablet Commonly known as: TYLENOL  Take 2 tablets (1,000 mg total)  by mouth every 8 (eight) hours. What changed:  when to take this reasons to take this   albuterol  108 (90 Base) MCG/ACT inhaler Commonly known as: VENTOLIN  HFA Inhale 1-2 puffs into the lungs every 6 (six) hours as needed for wheezing or shortness of breath.   allopurinol  300 MG tablet Commonly known as: ZYLOPRIM  Take 1 tablet (300 mg total) by mouth daily.   amiodarone  200 MG tablet Commonly known as: PACERONE  Take 1 tablet (200 mg total) by mouth 2 (two) times daily.   amoxicillin-clavulanate 875-125 MG tablet Commonly known as: AUGMENTIN Take 1 tablet by mouth 2 (two) times daily for 3 days.   atorvastatin  20 MG tablet Commonly known as: LIPITOR Take 1 tablet (20 mg total) by mouth daily.   busPIRone  5 MG tablet Commonly known as: BUSPAR  Take 1 tablet (5 mg total) by mouth 3 (three) times daily as needed (anxiety).   CALCIUM  600 + D PO Take 1 tablet by mouth in the morning.   cyanocobalamin  1000 MCG tablet Commonly known as: VITAMIN B12 Take 1,000 mcg by mouth daily.   diltiazem  120 MG 24 hr capsule Commonly known as: CARDIZEM  CD Take 1 capsule (120 mg total) by mouth daily. What changed: when to take this   DULoxetine  20 MG capsule Commonly known as: CYMBALTA  TAKE 1 CAPSULE BY MOUTH EVERY DAY   ferrous sulfate  325 (65 FE) MG tablet Take 325 mg by mouth daily with breakfast.   HYDROcodone -acetaminophen  5-325 MG tablet Commonly known as: NORCO/VICODIN Take 1-2 tablets by mouth every  6 (six) hours as needed for severe pain (pain score 7-10).   levothyroxine  100 MCG tablet Commonly known as: SYNTHROID  Take 1 tablet (100 mcg total) by mouth daily before breakfast.   melatonin 3 MG Tabs tablet Take 1 tablet (3 mg total) by mouth at bedtime as needed (insomnia). What changed: when to take this   methocarbamol  500 MG tablet Commonly known as: ROBAXIN  TAKE 1 TABLET BY MOUTH EVERY 6 HOURS AS NEEDED FOR MUSCLE SPASMS.   pantoprazole  40 MG tablet Commonly  known as: PROTONIX  Take 1 tablet (40 mg total) by mouth 2 (two) times daily.   Potassium Chloride  ER 20 MEQ Tbcr Take 1 tablet (20 mEq total) by mouth daily.   torsemide  20 MG tablet Commonly known as: DEMADEX  Take 1 tablet (20 mg total) by mouth daily for CHF.   traMADol  50 MG tablet Commonly known as: ULTRAM  Take 2 tablets (100 mg total) by mouth every 12 (twelve) hours as needed for up to 10 days for moderate pain (pain score 4-6). What changed: reasons to take this   VITAMIN C PO Take 250 mg by mouth in the morning.   VITAMIN D3 PO Take 1,000 mcg by mouth in the morning.        Follow-up Information     Peggy Armstrong, [name] DASEN, PA-C Follow up in 1 week(s).   Specialty: Physician Assistant Contact information: [address] [place] [address] [phone]         Peggy Armstrong Valaria LABOR, MD. Schedule an appointment as soon as possible for a visit in 3 day(s).   Specialty: Oncology Contact information: [address] [place] [address] [phone]                Discharge Exam:   Subjective: Feeling ok but had an episode of anxiety and SOB this AM; RN placed [place] O2 despite no hypoxia and this resolved.  She was on RA at the time of my evaluation and remained on RA without hypoxia throughout therapy.   Objective: Vitals:   04/23/24 1100 04/23/24 1407  BP:  122/65  Pulse:  73  Resp:    Temp:  (!) 97.4 F (36.3 C)  SpO2: 99% 94%    Intake/Output Summary (Last 24 hours) at 04/23/2024 1558 Last data filed at 04/23/2024 0820 Gross per 24 hour  Intake 526.56 ml  Output 750 ml  Net -223.44 ml   Filed Weights   04/22/24 0039 04/23/24 0647  Weight: 74.4 kg 75.7 kg    Exam:  General:  Appears calm and comfortable and is in NAD Eyes:  normal lids, iris ENT:  grossly normal hearing, lips & tongue, mmm Cardiovascular:  RRR, no m/r/g. No LE edema.  Respiratory:   CTA bilaterally with no wheezes/rales/rhonchi.  Normal respiratory effort. Abdomen:  soft, NT,  ND Skin:  no rash or induration seen on limited exam Musculoskeletal:  grossly normal tone BUE/BLE, good ROM, no bony abnormality Psychiatric:  grossly normal mood and affect, speech fluent and appropriate, AOx3 Neurologic:  CN 2-12 grossly intact, moves all extremities in coordinated fashion  Data Reviewed: I have reviewed the patient's lab results since admission.  Pertinent labs for today include:   K+ 3.2, repleted WBC 5.7 Hgb 8.2 Platelets 57, improved    Condition at discharge: stable  The results of significant diagnostics from this hospitalization (including imaging, microbiology, ancillary and laboratory) are listed below for reference.   Imaging Studies: ECHOCARDIOGRAM LIMITED Result Date: 04/22/2024    ECHOCARDIOGRAM LIMITED REPORT  Patient Name:   MECHILLE VARGHESE Date of Exam: 04/22/2024 Medical Rec #:  [id number]         Height:       60.0 in Accession #:    [id number]        Weight:       164.0 lb Date of Birth:  07/24/1945         BSA:          1.716 m Patient Age:    57 years          BP:           130/59 mmHg Patient Gender: F                 HR:           68 bpm. Exam Location:  Inpatient Procedure: Limited Echo, Cardiac Doppler and Color Doppler (Both Spectral and            Color Flow Doppler were utilized during procedure). Indications:     CHF I50.31  History:         Patient has prior history of Echocardiogram examinations, most                  recent 02/15/2024.  Sonographer:     Tinnie Gosling RDCS Referring Phys:  [id number] SUBRINA SUNDIL Diagnosing Phys: Georganna Archer IMPRESSIONS  1. Left ventricular ejection fraction, by estimation, is 60 to 65%. The left ventricle has normal function. The left ventricle has no regional wall motion abnormalities. There is mild concentric left ventricular hypertrophy. Diastology was not assessed.  2. RVSP not assessed. Right ventricular systolic function is mildly reduced. The right ventricular size is mildly enlarged.  3. The  mitral valve is normal in structure. Trivial mitral valve regurgitation.  4. The aortic valve is tricuspid. Aortic valve regurgitation is not visualized.  5. The inferior vena cava is normal in size with greater than 50% respiratory variability, suggesting right atrial pressure of 3 mmHg. Conclusion(s)/Recommendation(s): Limited TTE for LV and RV systolic function assessment. Normal LV systolic function. Mildly reduced RV systolic function and mildly enlarged RV. Appears largely unchanged from prior TTE. FINDINGS  Left Ventricle: Left ventricular ejection fraction, by estimation, is 60 to 65%. The left ventricle has normal function. The left ventricle has no regional wall motion abnormalities. The left ventricular internal cavity size was normal in size. There is  mild concentric left ventricular hypertrophy. Diastology was not assessed. Right Ventricle: RVSP not assessed. The right ventricular size is mildly enlarged. No increase in right ventricular wall thickness. Right ventricular systolic function is mildly reduced. Left Atrium: Left atrial size was not assessed. Right Atrium: Right atrial size was not assessed. Pericardium: There is no evidence of pericardial effusion. Mitral Valve: The mitral valve is normal in structure. Trivial mitral valve regurgitation. Tricuspid Valve: The tricuspid valve is normal in structure. Aortic Valve: The aortic valve is tricuspid. Aortic valve regurgitation is not visualized. Pulmonic Valve: The pulmonic valve was not assessed. Aorta: Aortic root could not be assessed. Venous: The inferior vena cava is normal in size with greater than 50% respiratory variability, suggesting right atrial pressure of 3 mmHg. IAS/Shunts: The interatrial septum was not assessed. LEFT VENTRICLE PLAX 2D LVIDd:         5.70 cm LVIDs:         4.20 cm LV PW:         1.20 cm LV IVS:  1.20 cm  IVC IVC diam: 1.30 cm LEFT ATRIUM         Index LA diam:    4.70 cm 2.74 cm/m  MITRAL VALVE MV Area (PHT):  2.59 cm MV E velocity: 90.00 cm/s MV A velocity: 92.70 cm/s MV E/A ratio:  0.97 Georganna Archer Electronically signed by Georganna Archer Signature Date/Time: 04/22/2024/3:08:45 PM    Final (Updated)    DG Chest 2 View Result Date: 04/22/2024 EXAM: 2 VIEW(S) XRAY OF THE CHEST 04/22/2024 01:06:54 AM COMPARISON: Chest x-ray 03/12/2024. CLINICAL HISTORY: SHOB upon standing earlier this evening. FINDINGS: LUNGS AND PLEURA: There is some faint mild patchy airspace opacities in the right upper lobe. No pulmonary edema. No pleural effusion. No pneumothorax. HEART AND MEDIASTINUM: The heart is enlarged. BONES AND SOFT TISSUES: No acute osseous abnormality. Chronic proximal left humeral fracture again seen. IMPRESSION: 1. Faint mild patchy airspace opacities in the right upper lobe suspicious for infection . 2. Enlarged heart. Electronically signed by: Greig Pique MD 04/22/2024 01:09 AM EDT RP Workstation: [id number]   MR LUMBAR SPINE WO CONTRAST Result Date: 04/01/2024 EXAM: MRI LUMBAR SPINE 04/01/2024 07:15:00 AM TECHNIQUE: Multiplanar multisequence MRI of the lumbar spine was performed without the administration of intravenous contrast. COMPARISON: CT of the lumbar spine dated 04/01/2024 and MRI of the lumbar spine dated 03/12/2024. CLINICAL HISTORY: Compression fracture, lumbar. Pt c/o low back pain, eval lumbar compression fx. FINDINGS: BONES AND ALIGNMENT: Dextroscoliosis of the thoracolumbar spine again demonstrated. Mild-to-moderate compression fracture of L1 again demonstrated, which continues to be heterogeneously hyperintense on T2. Chronic burst fracture of L2 also again demonstrated, which is also heterogeneously hyperintense on T2. There is retropulsion of the posterior superior corner of the vertebral body, by approximately 6 mm, similar to the prior exam. Hemangiomas are again noted within the T11 and T12 vertebrae. At L4-L5, there is degenerative grade 1 anterolisthesis. Other visualized bone marrow  signal is unremarkable. SPINAL CORD: The conus terminates normally. SOFT TISSUES: No paraspinal mass. T12-L1: No significant disc herniation. No spinal canal stenosis or neural foraminal narrowing. L1-L2: Moderate-to-severe central spinal canal stenosis and bilateral lateral recess stenosis. There is possible impingement of the L2 nerves in the lateral recesses bilaterally. L2-L3: Mild disc bulging and bilateral facet arthrosis with mild central spinal canal stenosis and bilateral lateral recess stenosis. L3-L4: Mild facet hypertrophic changes. No significant disc herniation. No spinal canal stenosis or neural foraminal narrowing. L4-L5: Degenerative grade 1 anterolisthesis with mild bilateral facet hypertrophy, resulting in mild central spinal canal stenosis, moderate right lateral recess stenosis, and mild-to-moderate left lateral recess stenosis. L5-S1: No significant disc herniation. No spinal canal stenosis or neural foraminal narrowing. IMPRESSION: 1. 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 2. Mild-to-moderate compression fracture of L1 and chronic burst fracture of L2 with 6 mm retropulsion, unchanged from prior. 3. Mild central spinal canal stenosis and bilateral lateral recess stenosis at L2-3. 4. Mild central spinal canal stenosis, moderate right lateral recess stenosis, and mild-to-moderate left lateral recess stenosis at L4-5. Electronically signed by: Evalene Coho MD 04/01/2024 08:46 AM EDT RP Workstation: [id number]   CT ABDOMEN PELVIS W CONTRAST Result Date: 04/01/2024 CLINICAL DATA:  Abdominal pain and back pain. EXAM: CT ABDOMEN AND PELVIS WITH CONTRAST CT LUMBAR SPINE WITHOUT CONTRAST TECHNIQUE: Multidetector CT imaging of the abdomen and pelvis was performed using the standard protocol following bolus administration of intravenous contrast. Multiplanar CT  image  reconstructions were created and reviewed. Multidetector CT imaging of the lumbar spine was performed using the standard protocol without the use of contrast. Multiplanar CT image reconstructions were created and reviewed. RADIATION DOSE REDUCTION: This exam was performed according to the departmental dose-optimization program which includes automated exposure control, adjustment of the mA and/or kV according to patient size and/or use of iterative reconstruction technique. CONTRAST:  OMNIPAQUE  IOHEXOL  300 MG/ML  SOLN COMPARISON:  CTA chest, abdomen and pelvis and lumbar spine CT both 03/12/2024. FINDINGS: CT ABDOMEN AND PELVIS WITH CONTRAST FINDINGS Lower chest: There is a small layering right pleural effusion, significantly improved. There is posterior atelectasis in the lung bases. No focal pneumonia. There is mild cardiomegaly. Coronary artery calcifications. No pericardial effusion. There is a moderate-sized hiatal hernia, small amount of fluid again in the hernia sac. Hepatobiliary: No focal liver abnormality is seen. Status post cholecystectomy. There is chronic extrahepatic biliary prominence with common bile duct again 10 mm, with pneumobilia again seen. Pancreas: No mass or ductal dilatation or inflammation. Partial glandular atrophy. Spleen: Borderline prominent. There are numerous scattered subcentimeter hypodensities which are too small to characterize and were not well seen on the last study but probably present at that time, with a definitive increase in the number of lesions compared with CTA abdomen and pelvis 07/20/2023. Findings are nonspecific. Possible etiologies include infectious and neoplastic involvement. Adrenals/Urinary Tract: There is no adrenal mass. There is a 2.7 cm Bosniak 1 cyst in the medial upper pole left kidney, Hounsfield density is 9, and occasional few bilateral Bosniak 2 subcentimeter cortical cysts which are too small to characterize. There is no mass  enhancement. There is no urinary stone or obstruction. Unremarkable bladder. Stomach/Bowel: Negative for dilatation or wall thickening. An appendix is not seen in this patient. Scattered sigmoid diverticulosis without diverticulitis. Vascular/Lymphatic: Aortic atherosclerosis. No enlarged abdominal or pelvic lymph nodes. Reproductive: Status post hysterectomy. No adnexal masses. Other: None. Musculoskeletal: Osteopenia, degenerative and posttraumatic changes of the spine. 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 T Here are healed fractures of the posteromedial right tenth through twelfth ribs. Mild hip DJD. There are healed fracture deformities of the left pubic rami. CT LUMBAR SPINE WITHOUT CONTRAST FINDINGS Segmentation: Standard. Alignment: Mild dextroscoliosis apex at L2. Mild grade 1 degenerative anterolisthesis L3-4. 6 mm chronic posterosuperior vertebral cortical retropulsion at L2 due to prior L2 burst fracture. No new or worsening alignment abnormality.  No further listhesis. Vertebrae: Diffuse osteopenia. 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 No focal pathologic lesion. As above there is partial hemangiomatous replacement of the T11 and T12 vertebrae with thoracic spine bridging enthesopathy 08/06/2010. No interval worsening is seen in the previously noted L1 upper plate anterior wedge compression fracture. Anterior height loss is 20%, central height loss is 25-30%, posterior height loss about 15%. There is mild posterosuperior cortical retropulsion eccentric to the left which has slightly increased, with increased encroachment on the left subarticular zone. AP thecal sac is 10 mm in the midline. L2 burst fracture with 6 mm posterosuperior cortical retropulsion is unchanged with moderate spinal canal and subarticular zone effacement. This is unchanged as is a mild upper plate concave compression fracture deformity of L3. L4  and L5 remain normal in heights.  There is lumbar spondylosis. Paraspinal and other soft tissues: No acute finding. Disc levels: Just below T12-L1 there is increased effacement of the left ventral thecal sac due to slight increased posterosuperior cortical retropulsion at L1,  with encroachment on the subarticular zone. The disc itself is normal in height without herniation. There are slight facet spurs. The foramina are moderately stenotic. Widened L1-2 disc space due to the prior L2 burst fracture. Posterosuperior L2 cortical retropulsion as described above with moderate encroachment on the spinal canal and subarticular zones. Moderate to severe left and mild right L1-2 foraminal stenosis is seen with slight facet spurring. The L2-3 and L3-4 discs are normal in height. There is mild foraminal stenosis and facet hypertrophy. There are mild disc bulge without herniation or canal stenosis. At L4-5, the disc is normal in height. Grade 1 degenerative anterolisthesis is noted with moderate facet hypertrophy, ligamentous thickening, disc bulge and moderate spinal canal and lateral recess stenosis. Relatively mild foraminal stenosis. At L5-S1 there is facet hypertrophy and inferior foraminal bulging of the disc with bilateral moderate to severe foraminal stenosis, but no disc herniation or canal zone narrowing. The SI joints are patent. No sacral insufficiency fracture seen. There is spurring of both SI joints. IMPRESSION: 1. No acute findings in the abdomen or pelvis. 2. Small right pleural effusion, significantly improved. 3. Moderate-sized hiatal hernia. 4. Cardiomegaly with coronary artery and aortic atherosclerosis. 5. Numerous subcentimeter hypodensities in the spleen, too small to characterize but definitively increased in number compared with CTA abdomen and pelvis 07/20/2023. Findings are nonspecific, with possible etiologies including infectious and neoplastic involvement. 6. Osteopenia and degenerative change.  7. L1 upper plate anterior wedge compression fracture with 20% anterior height loss, 25-30% central height loss and 15% posterior height loss. There is mild posterosuperior cortical retropulsion eccentric to the left which has slightly increased, with increased encroachment on the left ventral thecal sac and subarticular zone. The overall vertebral height loss appears unchanged. 8. L2 burst fracture with 6 mm posterosuperior cortical retropulsion, unchanged. 9. L3 mild upper plate concave compression fracture deformity, unchanged. 10. 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 11. L5-S1 moderate to severe bilateral foraminal stenosis. Other levels with lesser foraminal stenosis as above. 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 Aortic Atherosclerosis (ICD10-I70.0). Electronically Signed   By: Francis Quam M.D.   On: 04/01/2024 02:37   CT L-SPINE NO CHARGE Result Date: 04/01/2024 CLINICAL DATA:  Abdominal pain and back pain. EXAM: CT ABDOMEN AND PELVIS WITH CONTRAST CT LUMBAR SPINE WITHOUT CONTRAST TECHNIQUE: Multidetector CT imaging of the abdomen and pelvis was performed using the standard protocol following bolus administration of intravenous contrast. Multiplanar CT image reconstructions were created and reviewed. Multidetector CT imaging of the lumbar spine was performed using the standard protocol without the use of contrast. Multiplanar CT image reconstructions were created and reviewed. RADIATION DOSE REDUCTION: This exam was performed according to the departmental dose-optimization program which includes automated exposure control, adjustment of the mA and/or kV according to patient size and/or use of iterative reconstruction technique. CONTRAST:  OMNIPAQUE  IOHEXOL  300 MG/ML  SOLN COMPARISON:  CTA chest, abdomen and pelvis and lumbar spine CT both 03/12/2024. FINDINGS: CT ABDOMEN AND PELVIS WITH CONTRAST FINDINGS Lower chest: There is a small layering right pleural effusion, significantly improved.  There is posterior atelectasis in the lung bases. No focal pneumonia. There is mild cardiomegaly. Coronary artery calcifications. No pericardial effusion. There is a moderate-sized hiatal hernia, small amount of fluid again in the hernia sac. Hepatobiliary: No focal liver abnormality is seen. Status post cholecystectomy. There is chronic extrahepatic biliary prominence with common bile duct again 10 mm, with pneumobilia again seen. Pancreas: No mass or ductal dilatation or inflammation. Partial glandular atrophy.  Spleen: Borderline prominent. There are numerous scattered subcentimeter hypodensities which are too small to characterize and were not well seen on the last study but probably present at that time, with a definitive increase in the number of lesions compared with CTA abdomen and pelvis 07/20/2023. Findings are nonspecific. Possible etiologies include infectious and neoplastic involvement. Adrenals/Urinary Tract: There is no adrenal mass. There is a 2.7 cm Bosniak 1 cyst in the medial upper pole left kidney, Hounsfield density is 9, and occasional few bilateral Bosniak 2 subcentimeter cortical cysts which are too small to characterize. There is no mass enhancement. There is no urinary stone or obstruction. Unremarkable bladder. Stomach/Bowel: Negative for dilatation or wall thickening. An appendix is not seen in this patient. Scattered sigmoid diverticulosis without diverticulitis. Vascular/Lymphatic: Aortic atherosclerosis. No enlarged abdominal or pelvic lymph nodes. Reproductive: Status post hysterectomy. No adnexal masses. Other: None. Musculoskeletal: Osteopenia, degenerative and posttraumatic changes of the spine. 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 T Here are healed fractures of the posteromedial right tenth through twelfth ribs. Mild hip DJD. There are healed fracture deformities of the left pubic rami. CT LUMBAR SPINE WITHOUT CONTRAST FINDINGS Segmentation: Standard.  Alignment: Mild dextroscoliosis apex at L2. Mild grade 1 degenerative anterolisthesis L3-4. 6 mm chronic posterosuperior vertebral cortical retropulsion at L2 due to prior L2 burst fracture. No new or worsening alignment abnormality.  No further listhesis. Vertebrae: Diffuse osteopenia. 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 No focal pathologic lesion. As above there is partial hemangiomatous replacement of the T11 and T12 vertebrae with thoracic spine bridging enthesopathy 08/06/2010. No interval worsening is seen in the previously noted L1 upper plate anterior wedge compression fracture. Anterior height loss is 20%, central height loss is 25-30%, posterior height loss about 15%. There is mild posterosuperior cortical retropulsion eccentric to the left which has slightly increased, with increased encroachment on the left subarticular zone. AP thecal sac is 10 mm in the midline. L2 burst fracture with 6 mm posterosuperior cortical retropulsion is unchanged with moderate spinal canal and subarticular zone effacement. This is unchanged as is a mild upper plate concave compression fracture deformity of L3. L4 and L5 remain normal in heights.  There is lumbar spondylosis. Paraspinal and other soft tissues: No acute finding. Disc levels: Just below T12-L1 there is increased effacement of the left ventral thecal sac due to slight increased posterosuperior cortical retropulsion at L1, with encroachment on the subarticular zone. The disc itself is normal in height without herniation. There are slight facet spurs. The foramina are moderately stenotic. Widened L1-2 disc space due to the prior L2 burst fracture. Posterosuperior L2 cortical retropulsion as described above with moderate encroachment on the spinal canal and subarticular zones. Moderate to severe left and mild right L1-2 foraminal stenosis is seen with slight facet spurring. The L2-3 and L3-4 discs are  normal in height. There is mild foraminal stenosis and facet hypertrophy. There are mild disc bulge without herniation or canal stenosis. At L4-5, the disc is normal in height. Grade 1 degenerative anterolisthesis is noted with moderate facet hypertrophy, ligamentous thickening, disc bulge and moderate spinal canal and lateral recess stenosis. Relatively mild foraminal stenosis. At L5-S1 there is facet hypertrophy and inferior foraminal bulging of the disc with bilateral moderate to severe foraminal stenosis, but no disc herniation or canal zone narrowing. The SI joints are patent. No sacral insufficiency fracture seen. There is spurring of both SI joints. IMPRESSION: 1. No acute findings in  the abdomen or pelvis. 2. Small right pleural effusion, significantly improved. 3. Moderate-sized hiatal hernia. 4. Cardiomegaly with coronary artery and aortic atherosclerosis. 5. Numerous subcentimeter hypodensities in the spleen, too small to characterize but definitively increased in number compared with CTA abdomen and pelvis 07/20/2023. Findings are nonspecific, with possible etiologies including infectious and neoplastic involvement. 6. Osteopenia and degenerative change. 7. L1 upper plate anterior wedge compression fracture with 20% anterior height loss, 25-30% central height loss and 15% posterior height loss. There is mild posterosuperior cortical retropulsion eccentric to the left which has slightly increased, with increased encroachment on the left ventral thecal sac and subarticular zone. The overall vertebral height loss appears unchanged. 8. L2 burst fracture with 6 mm posterosuperior cortical retropulsion, unchanged. 9. L3 mild upper plate concave compression fracture deformity, unchanged. 10. 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 11. L5-S1 moderate to severe bilateral foraminal stenosis. Other levels with lesser foraminal stenosis as above. 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 Aortic Atherosclerosis  (ICD10-I70.0). Electronically Signed   By: Francis Quam M.D.   On: 04/01/2024 02:37    Microbiology: Results for orders placed or performed during the hospital encounter of 04/22/24  Culture, blood (routine x 2) Call MD if unable to obtain prior to antibiotics being given     Status: None (Preliminary result)   Collection Time: 04/22/24  4:30 AM   Specimen: BLOOD RIGHT WRIST  Result Value Ref Range Status   Specimen Description   Final    BLOOD RIGHT WRIST Performed at [facility], [address], [place], [place] [address]    Special Requests   Final    BOTTLES DRAWN AEROBIC AND ANAEROBIC Blood Culture adequate volume Performed at [facility], [address], [place], [place] [address]    Culture   Final    NO GROWTH 1 DAY Performed at [facility], [address], [place], [place] [address]    Report Status PENDING  Incomplete  Culture, blood (routine x 2) Call MD if unable to obtain prior to antibiotics being given     Status: None (Preliminary result)   Collection Time: 04/22/24  4:46 AM   Specimen: BLOOD LEFT FOREARM  Result Value Ref Range Status   Specimen Description   Final    BLOOD LEFT FOREARM Performed at [facility], [address] [place], [place] [address]    Special Requests   Final    BOTTLES DRAWN AEROBIC AND ANAEROBIC Blood Culture results may not be optimal due to an inadequate volume of blood received in culture bottles Performed at [facility], [address], [place], [place] [address]    Culture   Final    NO GROWTH 1 DAY Performed at [facility], [address] [place], [place] [address]    Report Status PENDING  Incomplete  Respiratory (~20 pathogens) panel by PCR     Status: None   Collection Time: 04/22/24  5:11 AM   Specimen: Nasopharyngeal Swab; Respiratory  Result Value Ref Range Status   Adenovirus NOT DETECTED NOT DETECTED Final   Coronavirus 229E NOT DETECTED NOT  DETECTED Final    Comment: (NOTE) The Coronavirus on the Respiratory Panel, DOES NOT test for the novel  Coronavirus (2019 nCoV)    Coronavirus HKU1 NOT DETECTED NOT DETECTED Final   Coronavirus NL63 NOT DETECTED NOT DETECTED Final   Coronavirus OC43 NOT DETECTED NOT DETECTED Final   Metapneumovirus NOT DETECTED NOT DETECTED Final  Rhinovirus / Enterovirus NOT DETECTED NOT DETECTED Final   Influenza A NOT DETECTED NOT DETECTED Final   Influenza B NOT DETECTED NOT DETECTED Final   Parainfluenza Virus 1 NOT DETECTED NOT DETECTED Final   Parainfluenza Virus 2 NOT DETECTED NOT DETECTED Final   Parainfluenza Virus 3 NOT DETECTED NOT DETECTED Final   Parainfluenza Virus 4 NOT DETECTED NOT DETECTED Final   Respiratory Syncytial Virus NOT DETECTED NOT DETECTED Final   Bordetella pertussis NOT DETECTED NOT DETECTED Final   Bordetella Parapertussis NOT DETECTED NOT DETECTED Final   Chlamydophila pneumoniae NOT DETECTED NOT DETECTED Final   Mycoplasma pneumoniae NOT DETECTED NOT DETECTED Final    Comment: Performed at [facility], [address], [place], [place] [address]  MRSA Next Gen by PCR, Nasal     Status: Abnormal   Collection Time: 04/22/24  1:59 PM   Specimen: Nasal Mucosa; Nasal Swab  Result Value Ref Range Status   MRSA by PCR Next Gen DETECTED (A) NOT DETECTED Final    Comment: (NOTE) The GeneXpert MRSA Assay (FDA approved for NASAL specimens only), is one component of a comprehensive MRSA colonization surveillance program. It is not intended to diagnose MRSA infection nor to guide or monitor treatment for MRSA infections. Test performance is not FDA approved in patients less than 70 years old. Performed at [facility], [address] [place], [place] [address]     Labs: CBC: Recent Labs  Lab 04/17/24 1307 04/22/24 0124 04/22/24 0511 04/23/24 1055  WBC 7.6 7.8 6.7 5.7  NEUTROABS 6.6  --   --   --   HGB 9.6* 8.9* 8.5* 8.2*  HCT 32.4*  30.4* 28.5* 28.7*  MCV 93 93.5 94.7 96.0  PLT 41* 48* 42* 57*   Basic Metabolic Panel: Recent Labs  Lab 04/17/24 1307 04/22/24 0124 04/22/24 0511 04/23/24 1055  NA 140 138 138 136  K 3.3* 3.5 3.2* 3.2*  CL 103 102 101 101  CO2 26 27 27 27   GLUCOSE 93 111* 101* 87  BUN 20 16 16 22   CREATININE 0.95 0.80 0.80 0.96  CALCIUM  8.6* 8.3* 7.9* 8.3*   Liver Function Tests: Recent Labs  Lab 04/22/24 0124 04/22/24 0511  AST 44* 40  ALT 60* 54*  ALKPHOS 185* 169*  BILITOT 0.7 0.4  PROT 5.9* 5.4*  ALBUMIN 3.3* 3.0*   CBG: No results for input(s): GLUCAP in the last 168 hours.  Discharge time spent: greater than 30 minutes.  Signed: Delon Herald, MD [facility] 04/23/2024

## 2024-04-23 NOTE — Assessment & Plan Note (Addendum)
 Recent diagnosis, not amenable to surgery at this time based on overall poor mobility status Plans to follow-up with oncology to discuss further treatment plan include radiation therapy versus chemotherapy Dr. Ezzard is her doctor in [place], considering radiation, less likely chemotherapy F/u with Dr. Dasie from surgery if appropriate to revisit this discussion, as well - daughter does not think this is an option

## 2024-04-24 ENCOUNTER — Encounter: Payer: Self-pay | Admitting: *Deleted

## 2024-04-24 ENCOUNTER — Telehealth: Payer: Self-pay

## 2024-04-24 ENCOUNTER — Telehealth: Admitting: *Deleted

## 2024-04-24 LAB — LEGIONELLA PNEUMOPHILA SEROGP 1 UR AG: L. pneumophila Serogp 1 Ur Ag: NEGATIVE

## 2024-04-24 NOTE — Transitions of Care (Post Inpatient/ED Visit) (Unsigned)
   04/24/2024  Name: Peggy Armstrong MRN: [id number] DOB: Apr 01, 1946  Today's TOC FU Call Status: Today's TOC FU Call Status:: Unsuccessful Call (1st Attempt) Unsuccessful Call (1st Attempt) Date: 04/24/24  Attempted to reach the patient regarding the most recent Inpatient/ED visit.  Follow Up Plan: Additional outreach attempts will be made to reach the patient to complete the Transitions of Care (Post Inpatient/ED visit) call.   Signature Julian Lemmings, LPN [facility] Nurse Health Advisor Direct Dial [phone]

## 2024-04-25 NOTE — Transitions of Care (Post Inpatient/ED Visit) (Signed)
 04/25/2024  Name: Peggy Armstrong MRN: [id number] DOB: 27-Dec-1945  Today's TOC FU Call Status: Today's TOC FU Call Status:: Successful TOC FU Call Completed Unsuccessful Call (1st Attempt) Date: 04/24/24 [facility] FU Call Complete Date: 04/25/24 Patient's Name and Date of Birth confirmed.  Transition Care Management Follow-up Telephone Call Date of Discharge: 04/23/24 Discharge Facility: [facility]) Primary Inpatient Discharge Diagnosis:: pneumonia How have you been since you were released from the hospital?: Better Any questions or concerns?: No  Items Reviewed: Did you receive and understand the discharge instructions provided?: Yes Medications obtained,verified, and reconciled?: Yes (Medications Reviewed) Any new allergies since your discharge?: No Dietary orders reviewed?: Yes Do you have support at home?: Yes People in Home [RPT]: child(ren), adult  Medications Reviewed Today: Medications Reviewed Today     Reviewed by Emmitt Pan, LPN (Licensed Practical Nurse) on 04/25/24 at 1232  Med List Status: <None>   Medication Order Taking? Sig Documenting Provider Last Dose Status Informant  acetaminophen  (TYLENOL ) 500 MG tablet [id number] Yes Take 2 tablets (1,000 mg total) by mouth every 8 (eight) hours.  Patient taking differently: Take 1,000 mg by mouth every 6 (six) hours as needed for mild pain (pain score 1-3) or moderate pain (pain score 4-6).   Verdene Purchase, MD  Active Pharmacy Records, Care Giver, Family Member  albuterol  (VENTOLIN  HFA) 108 [phone] Base) MCG/ACT inhaler [id number] Yes Inhale 1-2 puffs into the lungs every 6 (six) hours as needed for wheezing or shortness of breath. [provider]  Active Pharmacy Records, Care Giver, Family Member  allopurinol  (ZYLOPRIM ) 300 MG tablet [id number] Yes Take 1 tablet (300 mg total) by mouth daily. Rothfuss, Jacob T, PA-C  Active Pharmacy Records, Care Giver, Family Member  amiodarone  (PACERONE ) 200 MG tablet  [id number] Yes Take 1 tablet (200 mg total) by mouth 2 (two) times daily. Croitoru, Mihai, MD  Active Pharmacy Records, Care Giver, Family Member  amoxicillin-clavulanate (AUGMENTIN) 875-125 MG tablet [id number] Yes Take 1 tablet by mouth 2 (two) times daily for 3 days. Barbarann Nest, MD  Active   Ascorbic Acid (VITAMIN C PO) [id number] Yes Take 250 mg by mouth in the morning. [provider]  Active Pharmacy Records, Care Giver, Family Member           Med Note RENNIS, DUROJAHYE' R   Mon Apr 01, 2024  5:30 AM)    atorvastatin  (LIPITOR) 20 MG tablet [id number] Yes Take 1 tablet (20 mg total) by mouth daily.   Active Pharmacy Records, Care Giver, Family Member  busPIRone  (BUSPAR ) 5 MG tablet [id number] Yes Take 1 tablet (5 mg total) by mouth 3 (three) times daily as needed (anxiety). Barbarann Nest, MD  Active   Calcium  Carb-Cholecalciferol  (CALCIUM  600 + D PO) [id number] Yes Take 1 tablet by mouth in the morning. [provider]  Active Pharmacy Records, Care Giver, Family Member  Cholecalciferol  (VITAMIN D3 PO) [id number] Yes Take 1,000 mcg by mouth in the morning. [provider]  Active Pharmacy Records, Care Giver, Family Member           Med Note RENNIS, DUROJAHYE' R   Mon Apr 01, 2024  5:25 AM)    cyanocobalamin  (VITAMIN B12) 1000 MCG tablet [id number] Yes Take 1,000 mcg by mouth daily. [provider]  Active Pharmacy Records, Care Giver, Family Member  diltiazem  (CARDIZEM  CD) 120 MG 24 hr capsule [id number] Yes Take 1 capsule (120 mg total) by mouth daily.  Patient taking differently: Take 120 mg by mouth at  bedtime.   Vernon Ranks, MD  Active Pharmacy Records, Care Giver, Family Member  DULoxetine  (CYMBALTA ) 20 MG capsule [id number] Yes TAKE 1 CAPSULE BY MOUTH EVERY DAY Rothfuss, Jacob T, PA-C  Active Pharmacy Records, Care Giver, Family Member  ferrous sulfate  325 (65 FE) MG tablet [id number] Yes Take 325 mg by mouth daily with breakfast. [provider]  Active Pharmacy Records, Care Giver, Family Member           Med Note (GARNER, TIFFANY L   Wed Nov 15, 2023  2:59 PM)    ferumoxytol  (FERAHEME ) 510 mg in sodium chloride  0.9 % 100 mL IVPB [id number]   Von Bellis, MD  Active   HYDROcodone -acetaminophen  (NORCO/VICODIN) 5-325 MG tablet [id number] Yes Take 1-2 tablets by mouth every 6 (six) hours as needed for severe pain (pain score 7-10). Barbarann Nest, MD  Active   levothyroxine  (SYNTHROID ) 100 MCG tablet [id number] Yes Take 1 tablet (100 mcg total) by mouth daily before breakfast. Rothfuss, [name] DASEN, PA-C  Active Pharmacy Records, Care Giver, Family Member  melatonin 3 MG TABS tablet [id number] Yes Take 1 tablet (3 mg total) by mouth at bedtime as needed (insomnia).  Patient taking differently: Take 3 mg by mouth at bedtime.   Verdene Purchase, MD  Active Pharmacy Records, Care Giver, Family Member  methocarbamol  (ROBAXIN ) 500 MG tablet [id number] Yes TAKE 1 TABLET BY MOUTH EVERY 6 HOURS AS NEEDED FOR MUSCLE SPASMS. Rothfuss, Jacob T, PA-C  Active Pharmacy Records, Care Giver, Family Member  pantoprazole  (PROTONIX ) 40 MG tablet [id number] Yes Take 1 tablet (40 mg total) by mouth 2 (two) times daily. Verdene Purchase, MD  Active Pharmacy Records, Care Giver, Family Member  Potassium Chloride  ER 20 MEQ TBCR [id number] Yes Take 1 tablet (20 mEq total) by mouth daily. Barbarann Nest, MD  Active   torsemide  (DEMADEX ) 20 MG tablet [id number] Yes Take 1 tablet (20 mg total) by mouth daily for CHF. Barbarann Nest, MD  Active   traMADol  (ULTRAM ) 50 MG tablet [id number] Yes Take 2 tablets (100 mg total) by mouth every 12 (twelve) hours as needed for up to 10 days for moderate pain (pain score 4-6). Barbarann Nest, MD  Active   Med List Note Steffi Nian, CPhT 04/22/24 [id number]): Daughter manages medications             Home Care and Equipment/Supplies: Were Home Health Services Ordered?: NA Any new equipment or medical supplies  ordered?: NA  Functional Questionnaire: Do you need assistance with bathing/showering or dressing?: Yes Do you need assistance with meal preparation?: Yes Do you need assistance with eating?: No Do you have difficulty maintaining continence: No Do you need assistance with getting out of bed/getting out of a chair/moving?: No Do you have difficulty managing or taking your medications?: Yes  Follow up appointments reviewed: PCP Follow-up appointment confirmed?: Yes Date of PCP follow-up appointment?: 05/02/24 Follow-up Provider: rothfuss Specialist Hospital Follow-up appointment confirmed?: Yes Date of Specialist follow-up appointment?: 04/29/24 Follow-Up Specialty Provider:: [facility] Do you need transportation to your follow-up appointment?: No Do you understand care options if your condition(s) worsen?: Yes-patient verbalized understanding    SIGNATURE Julian Lemmings, LPN [facility] Nurse Health Advisor Direct Dial [phone]

## 2024-04-26 ENCOUNTER — Other Ambulatory Visit (HOSPITAL_BASED_OUTPATIENT_CLINIC_OR_DEPARTMENT_OTHER): Payer: Self-pay

## 2024-04-26 ENCOUNTER — Other Ambulatory Visit: Payer: Self-pay

## 2024-04-26 ENCOUNTER — Ambulatory Visit

## 2024-04-26 VITALS — BP 108/70 | HR 80 | Ht 60.0 in | Wt 158.0 lb

## 2024-04-26 DIAGNOSIS — I480 Paroxysmal atrial fibrillation: Secondary | ICD-10-CM | POA: Diagnosis not present

## 2024-04-26 DIAGNOSIS — I5032 Chronic diastolic (congestive) heart failure: Secondary | ICD-10-CM | POA: Diagnosis not present

## 2024-04-26 MED ORDER — ATORVASTATIN CALCIUM 20 MG PO TABS
20.0000 mg | ORAL_TABLET | Freq: Every day | ORAL | 1 refills | Status: AC
  Filled 2024-04-26 – 2024-05-20 (×2): qty 90, 90d supply, fill #0
  Filled 2024-07-23 – 2024-07-29 (×2): qty 90, 90d supply, fill #1

## 2024-04-26 NOTE — Assessment & Plan Note (Signed)
" >>  ASSESSMENT AND PLAN FOR PAROXYSMAL ATRIAL FIBRILLATION (HCC) WRITTEN ON 04/26/2024  2:49 PM BY Keiji Melland, ALEAN SAUNDERS, MD  Remains in sinus rhythm. Continue amiodarone  200 mg once daily for rhythm control.  Not on anticoagulation, Eliquis  discontinued August 2025 during inpatient stay for acute severe anemia.  "

## 2024-04-26 NOTE — Progress Notes (Signed)
 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  Echocardiogram 02/15/2023 LVEF 60 to 65%, grade 1 diastolic dysfunction, biatrial dilatation, mild to moderate TR, ascending aorta 3.8 cm and repeat limited echocardiogram 04/22/2024 showed no significant changes, noted RV mildly dilated with mildly reduced function.  Here for follow-up visit accompanied by her daughter after recent discharge from the hospital where she was treated for community-acquired pneumonia.  Pending follow-up with Dr. Ezzard with regards to initiation of radiation therapy for adenocarcinoma of the duodenum.  Problem List Items Addressed This Visit     Chronic diastolic CHF (congestive heart failure) (HCC) - Primary   Euvolemic and compensated. Weight Has gradually reduced and now down to 158 pounds.  At her last office visit with me was 185 pounds.  Continue salt restriction below 2 g/day. Continue torsemide  20 mg once daily. Mild hypokalemia at discharge from the hospital, has been placed on potassium chloride  20 mEq daily,  continue the same.        Paroxysmal atrial fibrillation (HCC)   Remains in sinus rhythm. Continue amiodarone  200 mg once daily for rhythm control.  Not on anticoagulation, Eliquis  discontinued August 2025 during inpatient stay for acute severe anemia.       Return to clinic tentatively in 6 months.   History of Present Illness:    Peggy Armstrong is a 78 y.o. female who is being seen today for follow-up visit. PCP is Rothfuss, Jacob T, PA-C. Last visit with me in the office was 11/27/2023. Follows up with Dr. Ezzard and oncology.  Here for the visit today accompanied by her daughter.  Daughter helps care for her at home.  Her mobility is quite limited due to back pain and uses a walker while in the house.  Outdoors she is in a wheelchair.  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  Multiple ER visits and couple hospital admissions since her last visit with me.  In August admission was for severe anemia requiring blood transfusion.  Eliquis  was discontinued  Recently was admitted and discharged from [facility] 04/22/2024 through 04/23/2024 in the setting of symptoms of shortness of breath and diagnosed with community-acquired pneumonia with left lower lobe infiltrates.  Her diuretic torsemide   was resumed.  Mentions her breathing has gradually improved. No chest pain. No orthopnea. Despite her back  pain she is able to lay flat in her bed and sleep through the night. No significant worsening pedal edema. Appetite has been poor mostly due to significant back pain.  Her daughter helps manage her medications.   Recent EKG from 10/27/2025Sinus rhythm with heart rate 79/min, PR interval 175 ms, QRS duration 112 ms, intraventricular conduction delay nonspecific  Blood work from 04/23/2024 sodium 136, potassium 3.2 BUN 22, creatinine 0.96, eGFR greater than 60. CBC hemoglobin 8.2, hematocrit 28.7, WBC 5.7 and platelets 57.   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   Cellulitis of left arm 08/14/2023   Cholangitis (HCC)    Choledocholithiasis 03/22/2017   Chronic anticoagulation 02/12/2024   Chronic diastolic CHF (congestive heart failure) (HCC) 11/08/2019   Chronic low back pain 07/18/2023   CKD (chronic kidney disease) stage 3, GFR 30-59 ml/min (HCC) 06/09/2017   CKD (chronic kidney disease), stage III (HCC) 06/09/2017   Closed compression fracture of body of L1 vertebra (HCC) 02/10/2024   Closed compression fracture of L2 lumbar vertebra, initial encounter (HCC) 05/11/2023   Clotting disorder    right DVT     Congestive heart failure (HCC) 05/18/2023   Constipation 02/21/2024   Dehydration 05/12/2023   Depressive disorder 05/18/2023   Difficulty walking 05/19/2023   Disorder of bone 10/25/2023   Displacement of bone 10/25/2023   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    Duodenal mass    Duodenal ulcer    DVT (deep venous thrombosis) (HCC)    right DVT   Dysphagia 05/19/2023   Epigastric pain    Essential hypertension    Fall at home, initial encounter 10/21/2023   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   GAD (generalized anxiety disorder)    Gastric polyps 05/16/2023   Generalized weakness 05/12/2023   GI bleed 07/20/2023   Goals of care, counseling/discussion 02/20/2024   Gout    Gout    Hematemesis 11/05/2017   Hemorrhagic shock (HCC) 07/21/2023   History of depression 10/21/2023   History of DVT (deep vein thrombosis) 11/08/2019   History of GI bleed 10/21/2023   Hyperlipidemia 05/12/2023   Hypertension    Hypokalemia 07/03/2017   Hypothyroidism 05/12/2023   IDA (iron  deficiency anemia) 02/15/2024   Iron  deficiency anemia 08/02/2016   Left elbow avulsion fracture 10/21/2023   Left humeral fracture 10/21/2023   Long term current use of anticoagulant therapy 06/20/2023   Lumbar radiculopathy 12/15/2023   Macrocytic anemia 11/05/2017   Medication management 02/21/2024   Melena 11/05/2017   Morbid obesity (HCC) 11/08/2019   Mucosal abnormality of esophagus 02/14/2024   Muscle weakness 05/19/2023   Nausea & vomiting    Need for assistance with personal care 05/21/2023   Need for immunization against influenza 07/18/2019   Need for vaccination 04/23/2018   Obesity with body mass index 30 or greater 10/25/2023  Occult blood in stools 05/16/2023   Pain 02/21/2024   Palliative care encounter 02/20/2024   Paroxysmal atrial fibrillation (HCC) 05/12/2023   Peri-ampullary neoplasm    Poor dentition 08/14/2016   RA (rheumatoid arthritis) (HCC)    Reduced mobility 06/20/2023   Restless leg 12/02/2022   Right shoulder pain 07/26/2018   Routine general medical examination at a health care facility 12/13/2019   S/P ERCP 05/05/2017   Severe anemia 02/10/2024   SIRS (systemic inflammatory response syndrome) (HCC) 03/22/2017   Symbolic  dysfunction 05/19/2023   Unsteadiness on feet 05/21/2023   Upper GI bleed 06/20/2023    Past Surgical History:  Procedure Laterality Date   ABDOMINAL HYSTERECTOMY     BIOPSY  05/16/2023   Procedure: BIOPSY;  Surgeon: Aneita Gwendlyn DASEN, MD;  Location: [facility];  Service: Gastroenterology;;   CHOLECYSTECTOMY     ENDOSCOPIC RETROGRADE CHOLANGIOPANCREATOGRAPHY (ERCP) WITH PROPOFOL  N/A 06/15/2017   Procedure: ENDOSCOPIC RETROGRADE CHOLANGIOPANCREATOGRAPHY (ERCP) WITH PROPOFOL ;  Surgeon: Teressa Toribio SQUIBB, MD;  Location: [facility];  Service: Endoscopy;  Laterality: N/A;   ESOPHAGOGASTRODUODENOSCOPY N/A 02/14/2024   Procedure: EGD (ESOPHAGOGASTRODUODENOSCOPY);  Surgeon: Wilhelmenia Aloha Raddle., MD;  Location: [facility];  Service: Gastroenterology;  Laterality: N/A;   ESOPHAGOGASTRODUODENOSCOPY (EGD) WITH PROPOFOL  N/A 03/24/2017   Procedure: ESOPHAGOGASTRODUODENOSCOPY (EGD) WITH PROPOFOL ;  Surgeon: Legrand Victory LITTIE DOUGLAS, MD;  Location: [facility];  Service: Gastroenterology;  Laterality: N/A;   ESOPHAGOGASTRODUODENOSCOPY (EGD) WITH PROPOFOL  N/A 11/07/2017   Procedure: ESOPHAGOGASTRODUODENOSCOPY (EGD) WITH PROPOFOL ;  Surgeon: Teressa Toribio SQUIBB, MD;  Location: [facility];  Service: Endoscopy;  Laterality: N/A;   ESOPHAGOGASTRODUODENOSCOPY (EGD) WITH PROPOFOL  N/A 05/16/2023   Procedure: ESOPHAGOGASTRODUODENOSCOPY (EGD) WITH PROPOFOL ;  Surgeon: Aneita Gwendlyn DASEN, MD;  Location: [facility];  Service: Gastroenterology;  Laterality: N/A;   ESOPHAGOGASTRODUODENOSCOPY (EGD) WITH PROPOFOL  N/A 07/21/2023   Procedure: ESOPHAGOGASTRODUODENOSCOPY (EGD) WITH PROPOFOL ;  Surgeon: Charlanne Groom, MD;  [facility];  Service: Gastroenterology;  Laterality: N/A;   EUS N/A 04/06/2017   Procedure: UPPER ENDOSCOPIC ULTRASOUND (EUS) LINEAR;  Surgeon: Teressa Toribio SQUIBB, MD;  Location: [facility];  Service: Endoscopy;  Laterality: N/A;  to evaluate duodenal mass   HEMOSTASIS CLIP PLACEMENT  07/21/2023    Procedure: HEMOSTASIS CLIP PLACEMENT;  Surgeon: Charlanne Groom, MD;  Location: [facility];  Service: Gastroenterology;;   HEMOSTASIS CONTROL  07/21/2023   Procedure: HEMOSTASIS CONTROL;  Surgeon: Charlanne Groom, MD;  Location: [facility];  Service: Gastroenterology;;   HERNIA REPAIR     IR ANGIOGRAM VISCERAL SELECTIVE  07/22/2023   IR BILIARY DRAIN PLACEMENT WITH CHOLANGIOGRAM  03/25/2017   IR CONVERT BILIARY DRAIN TO INT EXT BILIARY DRAIN  04/03/2017   IR EMBO ART  VEN HEMORR LYMPH EXTRAV  INC GUIDE ROADMAPPING  07/22/2023   IR US  GUIDE VASC ACCESS RIGHT  07/22/2023   POLYPECTOMY  05/16/2023   Procedure: POLYPECTOMY;  Surgeon: Aneita Gwendlyn DASEN, MD;  Location: [facility];  Service: Gastroenterology;;    Current Medications: Current Meds  Medication Sig   acetaminophen  (TYLENOL ) 500 MG tablet Take 2 tablets (1,000 mg total) by mouth every 8 (eight) hours. (Patient taking differently: Take 1,000 mg by mouth every 6 (six) hours as needed for mild pain (pain score 1-3) or moderate pain (pain score 4-6).)   albuterol  (VENTOLIN  HFA) 108 (90 Base) MCG/ACT inhaler Inhale 1-2 puffs into the lungs every 6 (six) hours as needed for wheezing or shortness of breath.   allopurinol  (ZYLOPRIM ) 300 MG tablet Take 1 tablet (300 mg total) by mouth daily.  amiodarone  (PACERONE ) 200 MG tablet Take 1 tablet (200 mg total) by mouth 2 (two) times daily.   Ascorbic Acid (VITAMIN C PO) Take 250 mg by mouth in the morning.   busPIRone  (BUSPAR ) 5 MG tablet Take 1 tablet (5 mg total) by mouth 3 (three) times daily as needed (anxiety).   Calcium  Carb-Cholecalciferol  (CALCIUM  600 + D PO) Take 1 tablet by mouth in the morning.   Cholecalciferol  (VITAMIN D3 PO) Take 1,000 mcg by mouth in the morning.   cyanocobalamin  (VITAMIN B12) 1000 MCG tablet Take 1,000 mcg by mouth daily.   diltiazem  (CARDIZEM  CD) 120 MG 24 hr capsule Take 1 capsule (120 mg total) by mouth daily. (Patient taking differently: Take 120 mg by mouth at  bedtime.)   DULoxetine  (CYMBALTA ) 20 MG capsule TAKE 1 CAPSULE BY MOUTH EVERY DAY   ferrous sulfate  325 (65 FE) MG tablet Take 325 mg by mouth daily with breakfast.   HYDROcodone -acetaminophen  (NORCO/VICODIN) 5-325 MG tablet Take 1-2 tablets by mouth every 6 (six) hours as needed for severe pain (pain score 7-10).   levothyroxine  (SYNTHROID ) 100 MCG tablet Take 1 tablet (100 mcg total) by mouth daily before breakfast.   melatonin 3 MG TABS tablet Take 1 tablet (3 mg total) by mouth at bedtime as needed (insomnia). (Patient taking differently: Take 3 mg by mouth at bedtime.)   methocarbamol  (ROBAXIN ) 500 MG tablet TAKE 1 TABLET BY MOUTH EVERY 6 HOURS AS NEEDED FOR MUSCLE SPASMS.   pantoprazole  (PROTONIX ) 40 MG tablet Take 1 tablet (40 mg total) by mouth 2 (two) times daily.   Potassium Chloride  ER 20 MEQ TBCR Take 1 tablet (20 mEq total) by mouth daily.   torsemide  (DEMADEX ) 20 MG tablet Take 1 tablet (20 mg total) by mouth daily for CHF.   [DISCONTINUED] atorvastatin  (LIPITOR) 20 MG tablet Take 1 tablet (20 mg total) by mouth daily.     Allergies:   Codeine, Tape, and Oxycodone    Social History   Socioeconomic History   Marital status: Divorced    Spouse name: Not on file   Number of children: 2   Years of education: 11   Highest education level: Not on file  Occupational History   Occupation: RETIRED - FACTORY WORKER  Tobacco Use   Smoking status: Former    Current packs/day: 0.00    Average packs/day: 1.5 packs/day for 44.0 years (66.0 ttl pk-yrs)    Types: Cigarettes    Start date: 46    Quit date: 2012    Years since quitting: 13.8    Passive exposure: Past   Smokeless tobacco: Former   Tobacco comments:    Quit in 2012. Started at 20's. Cannot recall how many.   Vaping Use   Vaping status: Never Used  Substance and Sexual Activity   Alcohol use: No   Drug use: No   Sexual activity: Yes    Comment: 2 children. Retired teacher, early years/pre.  Other Topics Concern   Not on file   Social History Narrative   Not on file   Social Drivers of Health   Financial Resource Strain: Low Risk  (12/02/2022)   Received from [facility]   Overall Financial Resource Strain (CARDIA)    Difficulty of Paying Living Expenses: Not very hard  Food Insecurity: No Food Insecurity (04/22/2024)   Hunger Vital Sign    Worried About Running Out of Food in the Last Year: Never true    Ran Out of Food in the Last Year: Never true  Transportation Needs: No Transportation Needs (04/22/2024)   PRAPARE - Administrator, Civil Service (Medical): No    Lack of Transportation (Non-Medical): No  Physical Activity: Unknown (04/05/2022)   Received from [facility]   Exercise Vital Sign    On average, how many days per week do you engage in moderate to strenuous exercise (like a brisk walk)?: 0 days    Minutes of Exercise per Session: Not on file  Stress: No Stress Concern Present (04/05/2022)   Received from [facility] Occupational Health - Occupational Stress Questionnaire    Feeling of Stress : Not at all  Social Connections: Socially Isolated (04/22/2024)   Social Connection and Isolation Panel    Frequency of Communication with Friends and Family: More than three times a week    Frequency of Social Gatherings with Friends and Family: More than three times a week    Attends Religious Services: Never    Database Administrator or Organizations: No    Attends Engineer, Structural: Never    Marital Status: Divorced     Family History: The patient's family history includes Colon cancer in her child and father; Diabetes in her paternal grandmother; Heart disease in her paternal grandfather; Hypertension in her brother and mother; Prostate cancer in her father; Rheum arthritis in her maternal grandmother. ROS:   Please see the history of present illness.    All 14 point review of systems negative except as described per history of present  illness.  EKGs/Labs/Other Studies Reviewed:    The following studies were reviewed today:   EKG:       Recent Labs: 07/18/2023: TSH 5.230 02/23/2024: Magnesium  1.9 03/12/2024: B Natriuretic Peptide 130.7 04/22/2024: ALT 54; Pro Brain Natriuretic Peptide 4,643.0 04/23/2024: BUN 22; Creatinine, Ser 0.96; Hemoglobin 8.2; Platelets 57; Potassium 3.2; Sodium 136  Recent Lipid Panel    Component Value Date/Time   CHOL 127 07/18/2023 1542   TRIG 122 07/18/2023 1542   HDL 25 (L) 07/18/2023 1542   CHOLHDL 5.1 (H) 07/18/2023 1542   LDLCALC 80 07/18/2023 1542    Physical Exam:    VS:  BP 108/70   Pulse 80   Ht 5' (1.524 m)   Wt 158 lb (71.7 kg)   SpO2 96%   BMI 30.86 kg/m     Wt Readings from Last 3 Encounters:  04/26/24 158 lb (71.7 kg)  04/23/24 166 lb 14.2 oz (75.7 kg)  04/11/24 164 lb 3.2 oz (74.5 kg)     GENERAL:  Well nourished, well developed in no acute distress NECK: No JVD; No carotid bruits CARDIAC: RRR, S1 and S2 present, no murmurs, no rubs, no gallops CHEST:  Clear to auscultation without rales, wheezing or rhonchi  Extremities: No pitting pedal edema. Pulses bilaterally symmetric with radial 2+ and dorsalis pedis 2+ NEUROLOGIC:  Alert and oriented x 3  Medication Adjustments/Labs and Tests Ordered: Current medicines are reviewed at length with the patient today.  Concerns regarding medicines are outlined above.  No orders of the defined types were placed in this encounter.  No orders of the defined types were placed in this encounter.   Signed, Ameya Vowell reddy Deoni Cosey, MD, MPH, [facility]. 04/26/2024 2:49 PM    [facility]

## 2024-04-26 NOTE — Assessment & Plan Note (Signed)
 Euvolemic and compensated. Weight Has gradually reduced and now down to 158 pounds.  At her last office visit with me was 185 pounds.  Continue salt restriction below 2 g/day. Continue torsemide  20 mg once daily. Mild hypokalemia at discharge from the hospital, has been placed on potassium chloride  20 mEq daily, continue the same.

## 2024-04-26 NOTE — Assessment & Plan Note (Signed)
 Remains in sinus rhythm. Continue amiodarone  200 mg once daily for rhythm control.  Not on anticoagulation, Eliquis  discontinued August 2025 during inpatient stay for acute severe anemia.

## 2024-04-26 NOTE — Patient Instructions (Addendum)
 Medication Instructions:  Your physician recommends that you continue on your current medications as directed. Please refer to the Current Medication list given to you today.  *If you need a refill on your cardiac medications before your next appointment, please call your pharmacy*   Lab Work: None Ordered If you have labs (blood work) drawn today and your tests are completely normal, you will receive your results only by: MyChart Message (if you have MyChart) OR A paper copy in the mail If you have any lab test that is abnormal or we need to change your treatment, we will call you to review the results.   Testing/Procedures: None Ordered   Follow-Up: At [facility], you and your health needs are our priority.  As part of our continuing mission to provide you with exceptional heart care, we have created designated Provider Care Teams.  These Care Teams include your primary Cardiologist (physician) and Advanced Practice Providers (APPs -  Physician Assistants and Nurse Practitioners) who all work together to provide you with the care you need, when you need it.  We recommend signing up for the patient portal called "MyChart".  Sign up information is provided on this After Visit Summary.  MyChart is used to connect with patients for Virtual Visits (Telemedicine).  Patients are able to view lab/test results, encounter notes, upcoming appointments, etc.  Non-urgent messages can be sent to your provider as well.   To learn more about what you can do with MyChart, go to [email or web address].    Your next appointment:   6 month(s)  The format for your next appointment:   In Person  Provider:   Huntley Dec, MD    Other Instructions NA

## 2024-04-27 LAB — CULTURE, BLOOD (ROUTINE X 2)
Culture: NO GROWTH
Culture: NO GROWTH
Special Requests: ADEQUATE

## 2024-04-28 NOTE — Progress Notes (Unsigned)
 [facility]  [address] [place],  [place]  [address] [phone]  Clinic Day:  12/22/2023  Referring physician: Iven [name] DASEN, PA-C   HISTORY OF PRESENT ILLNESS:  The patient is a 78 y.o. female who I recently began seeing for newly diagnosed duodenal adenocarcinoma.  She comes in today to go over her PET scan images to determine if there is any occult evidence of disease metastasis.  She also comes in today to go over her Guardant testing to determine if her disease harbors a mutation/alteration for which some form of targeted therapy could be applied.  Since her last visit, the patient claims to be feeling better.  She claims to be walking more from the rehabilitation she has been receiving at her nursing facility.  She still holds her side hope that some type of treatment can be given to eradicate her duodenal adenocarcinoma.  PHYSICAL EXAM:  There were no vitals taken for this visit. Wt Readings from Last 3 Encounters:  04/26/24 158 lb (71.7 kg)  04/23/24 166 lb 14.2 oz (75.7 kg)  04/11/24 164 lb 3.2 oz (74.5 kg)   There is no height or weight on file to calculate BMI. Performance status (ECOG): 2 - Symptomatic, <50% confined to bed Physical Exam Constitutional:      Appearance: Normal appearance. She is obese. She is not ill-appearing.     Comments: She has a chronically ill appearance.  She is in a wheelchair and is wearing oxygen per nasal cannula.  HENT:     Mouth/Throat:     Mouth: Mucous membranes are moist.     Pharynx: Oropharynx is clear. No oropharyngeal exudate or posterior oropharyngeal erythema.  Cardiovascular:     Rate and Rhythm: Normal rate and regular rhythm.     Heart sounds: No murmur heard.    No friction rub. No gallop.  Pulmonary:     Effort: Pulmonary effort is normal. No respiratory distress.     Breath sounds: Decreased air movement present. Decreased breath sounds present. No wheezing, rhonchi or rales.   Abdominal:     General: Bowel sounds are normal. There is no distension.     Palpations: Abdomen is soft. There is no mass.     Tenderness: There is no abdominal tenderness.  Musculoskeletal:        General: No swelling.     Right lower leg: No edema.     Left lower leg: No edema.  Lymphadenopathy:     Cervical: No cervical adenopathy.     Upper Body:     Right upper body: No supraclavicular or axillary adenopathy.     Left upper body: No supraclavicular or axillary adenopathy.     Lower Body: No right inguinal adenopathy. No left inguinal adenopathy.  Skin:    General: Skin is warm.     Coloration: Skin is not jaundiced.     Findings: No lesion or rash.  Neurological:     General: No focal deficit present.     Mental Status: She is alert and oriented to person, place, and time. Mental status is at baseline.  Psychiatric:        Mood and Affect: Mood normal.        Behavior: Behavior normal.        Thought Content: Thought content normal.   SCANS: Her PET scan done yesterday revealed the following: FINDINGS: Physiologic uptake: Mediastinum: 1.59 SUV max Liver: 2.18 SUV max   NECK: Heterogeneous, nodular  thyroid  gland. Some coarse calcifications are present in the gland. Asymmetric increased activity in the right thyroid  lobe with an SUV max of 3.61  CHEST: Chest wall- There are no significant chest wall abnormalities. Axilla- There are no significant axillary abnormalities. Lung parenchyma- There are no significant lung parenchyma abnormalities. Mediastinum- There are no significant hilar or mediastinal adenopathy. Pleura- There are no significant pleural abnormalities.  ABDOMEN: Stomach- No significant abnormalities. Liver- No significant abnormalities. Spleen- No significant abnormalities. Pancreas-calcifications Kidneys- No significant abnormalities. Bowel-moderate-to-large hiatus hernia is present. There is some activity of the 2nd duodenum corresponding to the  area of concern seen on prior standard CT with an SUV maximum 5.08. While the activity at this site may be physiologic, the known position of the lesion suggests this is likely neoplastic and/or inflammatory. No soft tissue mass seen. Other, scattered sites of activity in the bowel are very likely physiologic. Spine- No significant abnormalities.  PELVIS: Bowel- Normal physiologic bowel activity is identified. Masses- There are no pelvic masses.  Bones-there is some activity around the left shoulder associated with fracture of the proximal humerus. 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 Partial healing of fracture at the junction between the pubis and inferior pubic ramus and between the superior pubic ramus and acetabulum.  IMPRESSION: 1. Focal radiotracer uptake at the 2nd duodenum consistent with the patient's known malignancy. No additional site of activity in the abdomen. 2. Decreased bone mineralization. Activity about the left proximal humerus is associated with humeral fracture. Correlate with history. If no prior imaging of this region was performed consider dedicated shoulder series and orthopedic consultation. Also healing fractures and nonunion involving the superior and inferior pubic rami on the left. 3. Compressive atelectasis right lower lobe related to moderate pleural effusion. Heart is enlarged. Coronary artery disease is present. Large hiatus hernia. 4. Heterogeneous, nodular thyroid  gland. Mild increased activity in the right thyroid  lobe. Consider thyroiditis. Correlation with laboratory indices suggested. Ultrasound may be helpful to exclude underlying worrisome nodule.  LABS:      Latest Ref Rng & Units 04/23/2024   10:55 AM 04/22/2024    5:11 AM 04/22/2024    1:24 AM  CBC  WBC 4.0 - 10.5 K/uL 5.7  6.7  7.8   Hemoglobin 12.0 - 15.0 g/dL 8.2  8.5  8.9   Hematocrit 36.0 - 46.0 % 28.7  28.5  30.4   Platelets 150 - 400 K/uL 57  42  48       Latest  Ref Rng & Units 04/23/2024   10:55 AM 04/22/2024    5:11 AM 04/22/2024    1:24 AM  CMP  Glucose 70 - 99 mg/dL 87  898  888   BUN 8 - 23 mg/dL 22  16  16    Creatinine 0.44 - 1.00 mg/dL 9.03  9.19  9.19   Sodium 135 - 145 mmol/L 136  138  138   Potassium 3.5 - 5.1 mmol/L 3.2  3.2  3.5   Chloride 98 - 111 mmol/L 101  101  102   CO2 22 - 32 mmol/L 27  27  27    Calcium  8.9 - 10.3 mg/dL 8.3  7.9  8.3   Total Protein 6.5 - 8.1 g/dL  5.4  5.9   Total Bilirubin 0.0 - 1.2 mg/dL  0.4  0.7   Alkaline Phos 38 - 126 U/L  169  185   AST 15 - 41 U/L  40  44   ALT  0 - 44 U/L  54  60    Lab Results  Component Value Date   CEA 4.18 11/17/2023     Latest Reference Range & Units 11/21/23 12:07  Iron  28 - 170 ug/dL 30  UIBC ug/dL 692  TIBC 749 - 549 ug/dL 662  Saturation Ratios 10.4 - 31.8 % 9 (L)  Ferritin 11 - 307 ng/mL 65  (L): Data is abnormally low  PATHOLOGY:  Her duodenal mass biopsy in April 2025 revealed the following: Diagnosis   A: Duodenum, mass, biopsy - 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  ASSESSMENT & PLAN:  A 78 y.o. female with duodenal adenocarcinoma.  In clinic today, I went over her PET scan images with her and her family, for which they could see there is no evidence of metastatic disease.  I also went over her Guardant test results with her, which unfortunately did not show any findings to suggest some form of targeted therapy could be used to address her disease.  The patient still holds aside hope that some of surgery can be done to remove her duodenal adenocarcinoma.  The patient is well aware of what was told to her initially at [facility],  where they felt a surgery somewhat similar to a Whipple procedure would be necessary to remove her tumor.  This patient, at best, has an ECOG 2 (likely 3) performance status.  I was very candid with the patient and her family in stating that I am not sure how well she could even tolerate neoadjuvant chemotherapy, which could potentially be used to reduce her tumor burden before undergoing surgery.  I really do not see how her current baseline health would be amenable for her to undergo surgery.  Nevertheless, the patient still wishes to have a second opinion regarding surgical options.  Based upon this, she will be sent to [facility] in [place] to discuss surgical opportunities.  I will see this patient back in 6 weeks for repeat clinical assessment.  Hopefully, by then, she will have had her second surgical opinion with regards to how to address her duodenal adenocarcinoma.  The patient and her family understand all the plans discussed today and are in agreement with them.    Novalie Leamy DELENA Kerns, MD

## 2024-04-29 ENCOUNTER — Other Ambulatory Visit: Payer: Self-pay | Admitting: Oncology

## 2024-04-29 ENCOUNTER — Inpatient Hospital Stay: Attending: Oncology

## 2024-04-29 ENCOUNTER — Other Ambulatory Visit (HOSPITAL_BASED_OUTPATIENT_CLINIC_OR_DEPARTMENT_OTHER): Payer: Self-pay

## 2024-04-29 ENCOUNTER — Telehealth (HOSPITAL_BASED_OUTPATIENT_CLINIC_OR_DEPARTMENT_OTHER): Payer: Self-pay

## 2024-04-29 ENCOUNTER — Inpatient Hospital Stay

## 2024-04-29 ENCOUNTER — Inpatient Hospital Stay (HOSPITAL_BASED_OUTPATIENT_CLINIC_OR_DEPARTMENT_OTHER): Admitting: Oncology

## 2024-04-29 ENCOUNTER — Inpatient Hospital Stay: Admitting: Oncology

## 2024-04-29 VITALS — BP 132/90 | HR 75 | Temp 97.9°F | Resp 14 | Ht 60.0 in

## 2024-04-29 DIAGNOSIS — G8929 Other chronic pain: Secondary | ICD-10-CM

## 2024-04-29 DIAGNOSIS — C170 Malignant neoplasm of duodenum: Secondary | ICD-10-CM | POA: Diagnosis present

## 2024-04-29 DIAGNOSIS — M48061 Spinal stenosis, lumbar region without neurogenic claudication: Secondary | ICD-10-CM | POA: Diagnosis not present

## 2024-04-29 DIAGNOSIS — M549 Dorsalgia, unspecified: Secondary | ICD-10-CM | POA: Diagnosis not present

## 2024-04-29 DIAGNOSIS — M545 Low back pain, unspecified: Secondary | ICD-10-CM

## 2024-04-29 LAB — CMP (CANCER CENTER ONLY)
ALT: 48 U/L — ABNORMAL HIGH (ref 0–44)
AST: 68 U/L — ABNORMAL HIGH (ref 15–41)
Albumin: 3.5 g/dL (ref 3.5–5.0)
Alkaline Phosphatase: 209 U/L — ABNORMAL HIGH (ref 38–126)
Anion gap: 9 (ref 5–15)
BUN: 17 mg/dL (ref 8–23)
CO2: 28 mmol/L (ref 22–32)
Calcium: 9.2 mg/dL (ref 8.9–10.3)
Chloride: 101 mmol/L (ref 98–111)
Creatinine: 0.81 mg/dL (ref 0.44–1.00)
GFR, Estimated: >60 mL/min (ref >60)
Glucose, Bld: 118 mg/dL — ABNORMAL HIGH (ref 70–99)
Potassium: 3.6 mmol/L (ref 3.5–5.1)
Sodium: 139 mmol/L (ref 135–145)
Total Bilirubin: 0.4 mg/dL (ref 0.0–1.2)
Total Protein: 6.4 g/dL — ABNORMAL LOW (ref 6.5–8.1)

## 2024-04-29 LAB — CBC WITH DIFFERENTIAL (CANCER CENTER ONLY)
Abs Immature Granulocytes: 0.08 K/uL — ABNORMAL HIGH (ref 0.00–0.07)
Basophils Absolute: 0 K/uL (ref 0.0–0.1)
Basophils Relative: 0 %
Eosinophils Absolute: 0.2 K/uL (ref 0.0–0.5)
Eosinophils Relative: 3 %
HCT: 32.7 % — ABNORMAL LOW (ref 36.0–46.0)
Hemoglobin: 9.8 g/dL — ABNORMAL LOW (ref 12.0–15.0)
Immature Granulocytes: 2 %
Lymphocytes Relative: 11 %
Lymphs Abs: 0.6 K/uL — ABNORMAL LOW (ref 0.7–4.0)
MCH: 27.5 pg (ref 26.0–34.0)
MCHC: 30 g/dL (ref 30.0–36.0)
MCV: 91.9 fL (ref 80.0–100.0)
Monocytes Absolute: 0.3 K/uL (ref 0.1–1.0)
Monocytes Relative: 6 %
Neutro Abs: 3.9 K/uL (ref 1.7–7.7)
Neutrophils Relative %: 78 %
Platelet Count: 201 K/uL (ref 150–400)
RBC: 3.56 MIL/uL — ABNORMAL LOW (ref 3.87–5.11)
RDW: 17 % — ABNORMAL HIGH (ref 11.5–15.5)
WBC Count: 5 K/uL (ref 4.0–10.5)
nRBC: 0 % (ref 0.0–0.2)

## 2024-04-29 MED ORDER — APIXABAN 5 MG PO TABS
5.0000 mg | ORAL_TABLET | Freq: Two times a day (BID) | ORAL | 0 refills | Status: AC
  Filled 2024-04-29 – 2024-07-23 (×5): qty 30, 15d supply, fill #0

## 2024-04-29 MED ORDER — BUSPIRONE HCL 10 MG PO TABS
10.0000 mg | ORAL_TABLET | Freq: Two times a day (BID) | ORAL | 2 refills | Status: DC
  Filled 2024-04-29: qty 180, 90d supply, fill #0

## 2024-04-29 MED ORDER — DULOXETINE HCL 20 MG PO CPEP
20.0000 mg | ORAL_CAPSULE | Freq: Every day | ORAL | 0 refills | Status: AC
  Filled 2024-04-29 – 2024-07-22 (×2): qty 30, 30d supply, fill #0

## 2024-04-29 MED ORDER — DULOXETINE HCL 20 MG PO CPEP
20.0000 mg | ORAL_CAPSULE | Freq: Every day | ORAL | 2 refills | Status: DC
  Filled 2024-04-29 (×2): qty 90, 90d supply, fill #0

## 2024-04-29 MED ORDER — TORSEMIDE 20 MG PO TABS
ORAL_TABLET | ORAL | 3 refills | Status: AC
  Filled 2024-04-29: qty 176, 100d supply, fill #0
  Filled 2024-05-20: qty 30, 30d supply, fill #0
  Filled 2024-06-14: qty 135, 90d supply, fill #0
  Filled 2024-07-11 – 2024-07-23 (×2): qty 135, 90d supply, fill #1

## 2024-04-29 MED ORDER — FENTANYL 25 MCG/HR TD PT72
1.0000 | MEDICATED_PATCH | TRANSDERMAL | 0 refills | Status: DC
  Filled 2024-04-29: qty 10, 30d supply, fill #0

## 2024-04-29 MED ORDER — OXYCODONE HCL 10 MG PO TABS
10.0000 mg | ORAL_TABLET | ORAL | 0 refills | Status: DC | PRN
  Filled 2024-04-29: qty 80, 14d supply, fill #0

## 2024-04-29 MED ORDER — ALLOPURINOL 300 MG PO TABS
300.0000 mg | ORAL_TABLET | Freq: Every day | ORAL | 0 refills | Status: DC
  Filled 2024-04-29: qty 30, 30d supply, fill #0

## 2024-04-29 NOTE — Telephone Encounter (Signed)
 Called Sandra from [facility] and gave pt VO.    Copied from CRM #[id number]. Topic: Clinical - Home Health Verbal Orders >> Apr 29, 2024  8:46 AM Aleatha BROCKS wrote: Caller/Agency: [facility]  Callback Number: [phone] Service Requested: Skilled Nursing Frequency: weekly x3 every other week 2x Any new concerns about the patient? No

## 2024-04-30 ENCOUNTER — Other Ambulatory Visit (HOSPITAL_BASED_OUTPATIENT_CLINIC_OR_DEPARTMENT_OTHER): Payer: Self-pay

## 2024-05-01 ENCOUNTER — Telehealth: Payer: Self-pay | Admitting: Dietician

## 2024-05-01 NOTE — Telephone Encounter (Signed)
 Patient screened on MST. First attempt to reach. Provided my cell# on voice mail to return call to set up a nutrition consult.  Micheline Craven, RDN, LDN Registered Dietitian, [facility] Part Time Remote (Usual office hours: Tuesday-Thursday) Cell: [phone]

## 2024-05-02 ENCOUNTER — Inpatient Hospital Stay (HOSPITAL_BASED_OUTPATIENT_CLINIC_OR_DEPARTMENT_OTHER): Admitting: Student

## 2024-05-11 ENCOUNTER — Other Ambulatory Visit: Payer: Self-pay

## 2024-05-13 ENCOUNTER — Encounter: Payer: Self-pay | Admitting: Pulmonary Disease

## 2024-05-14 ENCOUNTER — Other Ambulatory Visit: Payer: Self-pay

## 2024-05-14 ENCOUNTER — Other Ambulatory Visit (HOSPITAL_BASED_OUTPATIENT_CLINIC_OR_DEPARTMENT_OTHER): Payer: Self-pay

## 2024-05-14 ENCOUNTER — Other Ambulatory Visit (HOSPITAL_COMMUNITY): Payer: Self-pay

## 2024-05-15 ENCOUNTER — Other Ambulatory Visit: Payer: Self-pay

## 2024-05-15 ENCOUNTER — Other Ambulatory Visit (HOSPITAL_BASED_OUTPATIENT_CLINIC_OR_DEPARTMENT_OTHER): Payer: Self-pay

## 2024-05-15 ENCOUNTER — Telehealth (HOSPITAL_BASED_OUTPATIENT_CLINIC_OR_DEPARTMENT_OTHER): Payer: Self-pay | Admitting: Student

## 2024-05-15 NOTE — Telephone Encounter (Signed)
 Copied from CRM #[id number]. Topic: Clinical - Home Health Verbal Orders >> May 14, 2024  4:06 PM Antwanette L wrote: Caller/Agency: [facility] Callback Number: [phone] Service Requested: Occupational Therapy Frequency: 1w4 Any new concerns about the patient? No

## 2024-05-15 NOTE — Telephone Encounter (Signed)
Number provided is not in service?

## 2024-05-17 ENCOUNTER — Emergency Department (HOSPITAL_COMMUNITY)

## 2024-05-17 ENCOUNTER — Emergency Department (HOSPITAL_COMMUNITY)
Admission: EM | Admit: 2024-05-17 | Discharge: 2024-05-18 | Disposition: A | Discharge status: Home / Self Care | Attending: Emergency Medicine | Admitting: Emergency Medicine

## 2024-05-17 ENCOUNTER — Other Ambulatory Visit: Payer: Self-pay

## 2024-05-17 DIAGNOSIS — I5032 Chronic diastolic (congestive) heart failure: Secondary | ICD-10-CM | POA: Diagnosis not present

## 2024-05-17 DIAGNOSIS — R0602 Shortness of breath: Secondary | ICD-10-CM

## 2024-05-17 DIAGNOSIS — E039 Hypothyroidism, unspecified: Secondary | ICD-10-CM | POA: Insufficient documentation

## 2024-05-17 DIAGNOSIS — I4892 Unspecified atrial flutter: Secondary | ICD-10-CM | POA: Diagnosis not present

## 2024-05-17 DIAGNOSIS — Z7901 Long term (current) use of anticoagulants: Secondary | ICD-10-CM | POA: Insufficient documentation

## 2024-05-17 DIAGNOSIS — Z79899 Other long term (current) drug therapy: Secondary | ICD-10-CM | POA: Insufficient documentation

## 2024-05-17 LAB — CBC WITH DIFFERENTIAL/PLATELET
Abs Immature Granulocytes: 0.03 K/uL (ref 0.00–0.07)
Basophils Absolute: 0 K/uL (ref 0.0–0.1)
Basophils Relative: 0 %
Eosinophils Absolute: 0.1 K/uL (ref 0.0–0.5)
Eosinophils Relative: 2 %
HCT: 31.6 % — ABNORMAL LOW (ref 36.0–46.0)
Hemoglobin: 9.3 g/dL — ABNORMAL LOW (ref 12.0–15.0)
Immature Granulocytes: 1 %
Lymphocytes Relative: 14 %
Lymphs Abs: 0.5 K/uL — ABNORMAL LOW (ref 0.7–4.0)
MCH: 27.2 pg (ref 26.0–34.0)
MCHC: 29.4 g/dL — ABNORMAL LOW (ref 30.0–36.0)
MCV: 92.4 fL (ref 80.0–100.0)
Monocytes Absolute: 0.3 K/uL (ref 0.1–1.0)
Monocytes Relative: 9 %
Neutro Abs: 2.6 K/uL (ref 1.7–7.7)
Neutrophils Relative %: 74 %
Platelets: 92 K/uL — ABNORMAL LOW (ref 150–400)
RBC: 3.42 MIL/uL — ABNORMAL LOW (ref 3.87–5.11)
RDW: 16.7 % — ABNORMAL HIGH (ref 11.5–15.5)
WBC: 3.5 K/uL — ABNORMAL LOW (ref 4.0–10.5)
nRBC: 0 % (ref 0.0–0.2)

## 2024-05-17 LAB — RESP PANEL BY RT-PCR (RSV, FLU A&B, COVID)  RVPGX2
Influenza A by PCR: NEGATIVE
Influenza B by PCR: NEGATIVE
Resp Syncytial Virus by PCR: NEGATIVE
SARS Coronavirus 2 by RT PCR: NEGATIVE

## 2024-05-17 LAB — PRO BRAIN NATRIURETIC PEPTIDE: Pro Brain Natriuretic Peptide: 1821 pg/mL — ABNORMAL HIGH (ref <300.0)

## 2024-05-17 LAB — TROPONIN T, HIGH SENSITIVITY: Troponin T High Sensitivity: 20 ng/L — ABNORMAL HIGH (ref 0–19)

## 2024-05-17 LAB — COMPREHENSIVE METABOLIC PANEL WITH GFR
ALT: 17 U/L (ref 0–44)
AST: 41 U/L (ref 15–41)
Albumin: 3.3 g/dL — ABNORMAL LOW (ref 3.5–5.0)
Alkaline Phosphatase: 156 U/L — ABNORMAL HIGH (ref 38–126)
Anion gap: 8 (ref 5–15)
BUN: 15 mg/dL (ref 8–23)
CO2: 26 mmol/L (ref 22–32)
Calcium: 8.6 mg/dL — ABNORMAL LOW (ref 8.9–10.3)
Chloride: 105 mmol/L (ref 98–111)
Creatinine, Ser: 0.81 mg/dL (ref 0.44–1.00)
GFR, Estimated: >60 mL/min (ref >60)
Glucose, Bld: 99 mg/dL (ref 70–99)
Potassium: 3.4 mmol/L — ABNORMAL LOW (ref 3.5–5.1)
Sodium: 140 mmol/L (ref 135–145)
Total Bilirubin: 0.6 mg/dL (ref 0.0–1.2)
Total Protein: 6.1 g/dL — ABNORMAL LOW (ref 6.5–8.1)

## 2024-05-17 MED ORDER — TORSEMIDE 20 MG PO TABS
20.0000 mg | ORAL_TABLET | Freq: Once | ORAL | Status: AC
  Administered 2024-05-17: 20 mg via ORAL
  Filled 2024-05-17: qty 1

## 2024-05-17 NOTE — ED Provider Notes (Signed)
 [place] EMERGENCY DEPARTMENT AT [facility] Provider Note   CSN: [id number] Arrival date & time: 05/17/24  8182     Patient presents with: Shortness of Breath   Peggy Armstrong is a 78 y.o. female.    Patient with history of chronic lower back pain with spinal stenosis, history of atrial flutter, duodenal adenocarcinoma, history of GI bleed not on anticoagulation, chronic thrombocytopenia, hypothyroidism, and chronic diastolic heart failure -- presents to the emergency department today for evaluation of shortness of breath.  Patient reports increasing shortness of breath that started today.  She feels this more when she gets up and walks or when she lies flat.  She has lower extremity swelling that seems to be near baseline.  She denies fever or cough.  No chest pain, abdominal pain, nausea, vomiting, diarrhea.  Patient is not on chronic oxygen.  She was transported by EMS.  She states that she is on Lasix  for heart failure, does not feel that she has missed any doses but is not sure if she had to her dose today.       Prior to Admission medications   Medication Sig Start Date End Date Taking? Authorizing Provider  acetaminophen  (TYLENOL ) 500 MG tablet Take 2 tablets (1,000 mg total) by mouth every 8 (eight) hours. Patient taking differently: Take 1,000 mg by mouth every 6 (six) hours as needed for mild pain (pain score 1-3) or moderate pain (pain score 4-6). 02/23/24   Krishnan, Gokul, MD  albuterol  (VENTOLIN  HFA) 108 [phone] Base) MCG/ACT inhaler Inhale 1-2 puffs into the lungs every 6 (six) hours as needed for wheezing or shortness of breath.    [provider]  allopurinol  (ZYLOPRIM ) 300 MG tablet Take 1 tablet (300 mg total) by mouth daily. 07/18/23   Rothfuss, Jacob T, PA-C  allopurinol  (ZYLOPRIM ) 300 MG tablet Take 1 tablet (300 mg total) by mouth daily. 01/26/24     amiodarone  (PACERONE ) 200 MG tablet Take 1 tablet (200 mg total) by mouth 2 (two) times daily.  09/01/23   Croitoru, Jerel, MD  apixaban  (ELIQUIS ) 5 MG TABS tablet Take 1 tablet (5 mg total) by mouth 2 (two) times daily for AFIB 12/25/23     Ascorbic Acid (VITAMIN C PO) Take 250 mg by mouth in the morning.    [provider]  atorvastatin  (LIPITOR) 20 MG tablet Take 1 tablet (20 mg total) by mouth daily. 08/11/23     busPIRone  (BUSPAR ) 10 MG tablet Take 1 tablet (10 mg total) by mouth 2 (two) times daily. 08/22/23   Rothfuss, Jacob T, PA-C  busPIRone  (BUSPAR ) 5 MG tablet Take 1 tablet (5 mg total) by mouth 3 (three) times daily as needed (anxiety). 04/23/24   Barbarann Nest, MD  Calcium  Carb-Cholecalciferol  (CALCIUM  600 + D PO) Take 1 tablet by mouth in the morning.    [provider]  Cholecalciferol  (VITAMIN D3 PO) Take 1,000 mcg by mouth in the morning.    [provider]  cyanocobalamin  (VITAMIN B12) 1000 MCG tablet Take 1,000 mcg by mouth daily.    [provider]  diltiazem  (CARDIZEM  CD) 120 MG 24 hr capsule Take 1 capsule (120 mg total) by mouth daily. Patient taking differently: Take 120 mg by mouth at bedtime. 04/05/24 07/04/24  Vernon Ranks, MD  DULoxetine  (CYMBALTA ) 20 MG capsule TAKE 1 CAPSULE BY MOUTH EVERY DAY 10/23/23   Rothfuss, Jacob T, PA-C  DULoxetine  (CYMBALTA ) 20 MG capsule Take 1 capsule (20 mg total) by mouth  daily. 01/26/24     DULoxetine  (CYMBALTA ) 20 MG capsule Take 1 capsule (20 mg total) by mouth daily. 10/23/23   Rothfuss, Jacob T, PA-C  fentaNYL  (DURAGESIC ) 25 MCG/HR Place 1 patch onto the skin every 3 (three) days. 04/29/24   Ezzard Valaria LABOR, MD  ferrous sulfate  325 (65 FE) MG tablet Take 325 mg by mouth daily with breakfast.    [provider]  levothyroxine  (SYNTHROID ) 100 MCG tablet Take 1 tablet (100 mcg total) by mouth daily before breakfast. 07/18/23   Rothfuss, Jacob T, PA-C  melatonin 3 MG TABS tablet Take 1 tablet (3 mg total) by mouth at bedtime as needed (insomnia). Patient taking differently: Take 3 mg by mouth at  bedtime. 05/18/23   Krishnan, Gokul, MD  methocarbamol  (ROBAXIN ) 500 MG tablet TAKE 1 TABLET BY MOUTH EVERY 6 HOURS AS NEEDED FOR MUSCLE SPASMS. 10/03/23   Rothfuss, Jacob T, PA-C  Oxycodone  HCl 10 MG TABS Take 1 tablet (10 mg total) by mouth every 4 (four) to 6 (six) hours as needed for pain. 04/29/24   Lewis, Dequincy A, MD  pantoprazole  (PROTONIX ) 40 MG tablet Take 1 tablet (40 mg total) by mouth 2 (two) times daily. 02/23/24   Krishnan, Gokul, MD  Potassium Chloride  ER 20 MEQ TBCR Take 1 tablet (20 mEq total) by mouth daily. 04/23/24   Barbarann Nest, MD  torsemide  (DEMADEX ) 20 MG tablet Take torsemide  on alternating days 40 mg one day (2 tablets), then the next day 20 mg (1 tablet) 09/21/23   Madireddy, Alean SAUNDERS, MD    Allergies: Codeine, Tape, and Oxycodone     Review of Systems  Updated Vital Signs BP (!) 169/85 (BP Location: Right Arm)   Pulse 62   Temp 97.8 F (36.6 C) (Oral)   Ht 5' (1.524 m)   Wt 71.2 kg   SpO2 100%   BMI 30.66 kg/m   Physical Exam Vitals and nursing note reviewed.  Constitutional:      General: She is not in acute distress.    Appearance: She is well-developed.  HENT:     Head: Normocephalic and atraumatic.     Right Ear: External ear normal.     Left Ear: External ear normal.     Nose: Nose normal.  Eyes:     Conjunctiva/sclera: Conjunctivae normal.  Cardiovascular:     Rate and Rhythm: Normal rate and regular rhythm.     Heart sounds: No murmur heard. Pulmonary:     Effort: No respiratory distress.     Breath sounds: No wheezing, rhonchi or rales.  Abdominal:     Palpations: Abdomen is soft.     Tenderness: There is no abdominal tenderness. There is no guarding or rebound.  Musculoskeletal:     Cervical back: Normal range of motion and neck supple.     Right lower leg: Edema (1-2+) present.     Left lower leg: Edema (1-2+) present.     Comments: Uncomfortable sitting up due to back pain  Skin:    General: Skin is warm and dry.      Findings: No rash.  Neurological:     General: No focal deficit present.     Mental Status: She is alert. Mental status is at baseline.     Motor: No weakness.  Psychiatric:        Mood and Affect: Mood normal.     (all labs ordered are listed, but only abnormal results are displayed) Labs Reviewed  CBC WITH DIFFERENTIAL/PLATELET -  Abnormal; Notable for the following components:      Result Value   WBC 3.5 (*)    RBC 3.42 (*)    Hemoglobin 9.3 (*)    HCT 31.6 (*)    MCHC 29.4 (*)    RDW 16.7 (*)    Platelets 92 (*)    Lymphs Abs 0.5 (*)    All other components within normal limits  COMPREHENSIVE METABOLIC PANEL WITH GFR - Abnormal; Notable for the following components:   Potassium 3.4 (*)    Calcium  8.6 (*)    Total Protein 6.1 (*)    Albumin 3.3 (*)    Alkaline Phosphatase 156 (*)    All other components within normal limits  PRO BRAIN NATRIURETIC PEPTIDE - Abnormal; Notable for the following components:   Pro Brain Natriuretic Peptide 1,821.0 (*)    All other components within normal limits  TROPONIN T, HIGH SENSITIVITY - Abnormal; Notable for the following components:   Troponin T High Sensitivity 20 (*)    All other components within normal limits  RESP PANEL BY RT-PCR (RSV, FLU A&B, COVID)  RVPGX2  TROPONIN T, HIGH SENSITIVITY    EKG: EKG Interpretation Date/Time:  Friday May 17 2024 18:35:39 EST Ventricular Rate:  65 PR Interval:  181 QRS Duration:  138 QT Interval:  490 QTC Calculation: 510 R Axis:   -46  Text Interpretation: Sinus rhythm Left bundle branch block No acute changes No significant change since last tracing Confirmed by Charlyn Sora ([id number]) on 05/17/2024 8:25:29 PM  Radiology: DG Chest 2 View Result Date: 05/17/2024 EXAM: 2 VIEW(S) XRAY OF THE CHEST 05/17/2024 07:12:00 PM COMPARISON: 04/22/2024 CLINICAL HISTORY: SOB SOB FINDINGS: LUNGS AND PLEURA: No focal pulmonary opacity. No pleural effusion. No pneumothorax. HEART AND MEDIASTINUM:  The heart is borderline in size. BONES AND SOFT TISSUES: No acute osseous abnormality. IMPRESSION: 1. No acute cardiopulmonary process identified. 2. Borderline cardiomegaly. Electronically signed by: Franky Crease MD 05/17/2024 07:16 PM EST RP Workstation: [id number]     Procedures   Medications Ordered in the ED  torsemide  (DEMADEX ) tablet 20 mg (20 mg Oral Given 05/17/24 2034)    ED Course  Patient seen and examined. History obtained directly from patient.   Labs/EKG: Ordered CBC, CMP, troponin, BNP, COVID panel.  EKG.  Imaging: Ordered chest x-ray.  Medications/Fluids: None ordered  Most recent vital signs reviewed and are as follows: BP (!) 169/85 (BP Location: Right Arm)   Pulse 62   Temp 97.8 F (36.6 C) (Oral)   Resp 20   Ht 5' (1.524 m)   Wt 71.2 kg   SpO2 100%   BMI 30.66 kg/m   Initial impression: Patient with normal saturation on room air, no increased work of breathing or tachypnea.  She is talking in full sentences.   8:23 PM Reassessment performed. Patient appears stable, anxious.  Labs personally reviewed and interpreted including: CBC with white blood cell count 3.5, hemoglobin 9.3 near baseline, platelet count 92 also in line with counts over the past month; CMP potassium 3.4, normal creatinine; troponin 20 likely demand due to underlying heart failure; BNP was high but not to the extent of previous admission; COVID testing was normal.  Imaging personally visualized and interpreted including: Chest x-ray, agree no pneumonia or signs of fluid overload  Reviewed pertinent lab work and imaging with patient at bedside. Questions answered.   Most current vital signs reviewed and are as follows: BP (!) 169/85 (BP Location: Right Arm)   Pulse 62  Temp 97.8 F (36.6 C) (Oral)   Resp 20   Ht 5' (1.524 m)   Wt 71.2 kg   SpO2 100%   BMI 30.66 kg/m   Plan: Discharge to home.  Patient will be given a dose of torsemide  oral here.  Patient was discussed with  and seen by Dr. Charlyn agrees with plan.  Prescriptions written for: None  Other home care instructions discussed: Continue home treatment plan.  ED return instructions discussed: New or worsening symptoms, worsening shortness of breath or trouble breathing, fevers  Follow-up instructions discussed: Patient encouraged to follow-up with their PCP in 3 days.                                   Medical Decision Making Amount and/or Complexity of Data Reviewed Labs: ordered. Radiology: ordered.  Risk Prescription drug management.   Patient presents with shortness of breath today, normal vital signs, no hypoxia.  Symptoms are intermittent.  Patient has a history of heart failure which appears to be relatively compensated today.  Chest x-ray was clear.  She has what appears to be baseline lower extremity edema without worsening.  BNP is elevated but much improved from when she was admitted about a month ago.  Troponin borderline high, but also improved and elevation likely related to demand from underlying heart failure.  Low concern for ACS, PE today.  The patient's vital signs, pertinent lab work and imaging were reviewed and interpreted as discussed in the ED course. Hospitalization was considered for further testing, treatments, or serial exams/observation. However as patient is well-appearing, has a stable exam, and reassuring studies today, I do not feel that they warrant admission at this time. This plan was discussed with the patient who verbalizes agreement and comfort with this plan and seems reliable and able to return to the Emergency Department with worsening or changing symptoms.       Final diagnoses:  Shortness of breath  Chronic diastolic heart failure [facility])    ED Discharge Orders     None          Desiderio Chew, PA-C 05/17/24 2051    Charlyn Sora, MD 05/18/24 1433

## 2024-05-17 NOTE — ED Triage Notes (Signed)
 Patient BIB [facility] coming from home c/o SOB worsening with ambulation and laying flat. HX of CHF/cancer. Patient reports BLE swelling. Denies CP/fever/cough/chills. Patient is alert and oriented x 4. Airway patent, respirations even and unlabored. Skin normal, warm and dry.

## 2024-05-17 NOTE — ED Notes (Signed)
 [facility] called for transport. Pt confirmed her address was [address], [place] [address]. [facility] advised there would be a delay in transport.

## 2024-05-17 NOTE — Discharge Instructions (Signed)
 Please read and follow all provided instructions.  Your diagnoses today include:  1. Shortness of breath   2. Chronic diastolic heart failure (HCC)    Tests performed today include: Complete blood cell count: Your hemoglobin was low but stable Complete metabolic panel: Potassium was slightly low but no severe findings Screening test for heart failure: Elevated but much improved from when you were admitted a few weeks ago Cardiac enzyme did not show signs of acute stress on the heart COVID and flu testing were negative Chest x-ray does not show signs of pneumonia or too much fluid today Vital signs. See below for your results today.   Medications prescribed:  None  Take any prescribed medications only as directed.  Home care instructions:  Follow any educational materials contained in this packet.  Follow-up instructions: Please follow-up with your primary care provider in the next 3 days for further evaluation of your symptoms.   Return instructions:  Please return to the Emergency Department if you experience worsening symptoms.  Please return if you have any other emergent concerns.  Additional Information:  Your vital signs today were: BP (!) 169/85 (BP Location: Right Arm)   Pulse 62   Temp 97.8 F (36.6 C) (Oral)   Resp 20   Ht 5' (1.524 m)   Wt 71.2 kg   SpO2 100%   BMI 30.66 kg/m  If your blood pressure (BP) was elevated above 135/85 this visit, please have this repeated by your doctor within one month. --------------

## 2024-05-18 MED ORDER — OXYCODONE HCL 5 MG PO TABS
5.0000 mg | ORAL_TABLET | Freq: Four times a day (QID) | ORAL | Status: DC | PRN
  Administered 2024-05-18: 5 mg via ORAL
  Filled 2024-05-18: qty 1

## 2024-05-18 NOTE — ED Notes (Addendum)
 [facility] here to transport patient back home. Patient ok to be discharged at this time per Provider Griselda.

## 2024-05-18 NOTE — ED Notes (Addendum)
 Patient resting in bed waiting for [facility].

## 2024-05-20 ENCOUNTER — Other Ambulatory Visit (HOSPITAL_BASED_OUTPATIENT_CLINIC_OR_DEPARTMENT_OTHER): Payer: Self-pay

## 2024-05-20 ENCOUNTER — Other Ambulatory Visit: Payer: Self-pay

## 2024-05-21 ENCOUNTER — Other Ambulatory Visit (HOSPITAL_BASED_OUTPATIENT_CLINIC_OR_DEPARTMENT_OTHER): Payer: Self-pay

## 2024-05-22 ENCOUNTER — Other Ambulatory Visit: Payer: Self-pay

## 2024-05-22 ENCOUNTER — Other Ambulatory Visit: Payer: Self-pay | Admitting: Neurosurgery

## 2024-05-22 DIAGNOSIS — C170 Malignant neoplasm of duodenum: Secondary | ICD-10-CM

## 2024-05-26 ENCOUNTER — Other Ambulatory Visit: Payer: Self-pay

## 2024-05-26 ENCOUNTER — Emergency Department (HOSPITAL_COMMUNITY)
Admission: EM | Admit: 2024-05-26 | Discharge: 2024-05-26 | Disposition: A | Discharge status: Home / Self Care | Attending: Emergency Medicine | Admitting: Emergency Medicine

## 2024-05-26 ENCOUNTER — Encounter (HOSPITAL_COMMUNITY): Payer: Self-pay

## 2024-05-26 ENCOUNTER — Emergency Department (HOSPITAL_COMMUNITY)

## 2024-05-26 DIAGNOSIS — Z79890 Hormone replacement therapy (postmenopausal): Secondary | ICD-10-CM | POA: Insufficient documentation

## 2024-05-26 DIAGNOSIS — I509 Heart failure, unspecified: Secondary | ICD-10-CM | POA: Insufficient documentation

## 2024-05-26 DIAGNOSIS — E039 Hypothyroidism, unspecified: Secondary | ICD-10-CM | POA: Diagnosis not present

## 2024-05-26 DIAGNOSIS — R0602 Shortness of breath: Secondary | ICD-10-CM | POA: Diagnosis present

## 2024-05-26 LAB — CBC WITH DIFFERENTIAL/PLATELET
Basophils Absolute: 0 K/uL (ref 0.0–0.1)
Basophils Relative: 0 %
Eosinophils Absolute: 0 K/uL (ref 0.0–0.5)
Eosinophils Relative: 0 %
HCT: 33.4 % — ABNORMAL LOW (ref 36.0–46.0)
Hemoglobin: 9.8 g/dL — ABNORMAL LOW (ref 12.0–15.0)
Lymphocytes Relative: 6 %
Lymphs Abs: 0.3 K/uL — ABNORMAL LOW (ref 0.7–4.0)
MCH: 26.9 pg (ref 26.0–34.0)
MCHC: 29.3 g/dL — ABNORMAL LOW (ref 30.0–36.0)
MCV: 91.8 fL (ref 80.0–100.0)
Monocytes Absolute: 0.2 K/uL (ref 0.1–1.0)
Monocytes Relative: 5 %
Neutro Abs: 3.7 K/uL (ref 1.7–7.7)
Neutrophils Relative %: 89 %
Platelets: 96 K/uL — ABNORMAL LOW (ref 150–400)
RBC: 3.64 MIL/uL — ABNORMAL LOW (ref 3.87–5.11)
RDW: 16.6 % — ABNORMAL HIGH (ref 11.5–15.5)
WBC: 4.2 K/uL (ref 4.0–10.5)
nRBC: 0 % (ref 0.0–0.2)

## 2024-05-26 LAB — BASIC METABOLIC PANEL WITH GFR
Anion gap: 9 (ref 5–15)
BUN: 12 mg/dL (ref 8–23)
CO2: 25 mmol/L (ref 22–32)
Calcium: 8.5 mg/dL — ABNORMAL LOW (ref 8.9–10.3)
Chloride: 104 mmol/L (ref 98–111)
Creatinine, Ser: 0.83 mg/dL (ref 0.44–1.00)
GFR, Estimated: >60 mL/min (ref >60)
Glucose, Bld: 101 mg/dL — ABNORMAL HIGH (ref 70–99)
Potassium: 3.5 mmol/L (ref 3.5–5.1)
Sodium: 138 mmol/L (ref 135–145)

## 2024-05-26 LAB — RESP PANEL BY RT-PCR (RSV, FLU A&B, COVID)  RVPGX2
Influenza A by PCR: NEGATIVE
Influenza B by PCR: NEGATIVE
Resp Syncytial Virus by PCR: NEGATIVE
SARS Coronavirus 2 by RT PCR: NEGATIVE

## 2024-05-26 LAB — TROPONIN I (HIGH SENSITIVITY): Troponin I (High Sensitivity): 11 ng/L (ref <18)

## 2024-05-26 LAB — BRAIN NATRIURETIC PEPTIDE: B Natriuretic Peptide: 303.2 pg/mL — ABNORMAL HIGH (ref 0.0–100.0)

## 2024-05-26 MED ORDER — FENTANYL CITRATE (PF) 50 MCG/ML IJ SOSY
25.0000 ug | PREFILLED_SYRINGE | Freq: Once | INTRAMUSCULAR | Status: AC
  Administered 2024-05-26: 25 ug via INTRAVENOUS
  Filled 2024-05-26: qty 1

## 2024-05-26 MED ORDER — IOHEXOL 350 MG/ML SOLN
75.0000 mL | Freq: Once | INTRAVENOUS | Status: AC | PRN
  Administered 2024-05-26: 75 mL via INTRAVENOUS

## 2024-05-26 MED ORDER — KETOROLAC TROMETHAMINE 15 MG/ML IJ SOLN
15.0000 mg | Freq: Once | INTRAMUSCULAR | Status: AC
  Administered 2024-05-26: 15 mg via INTRAVENOUS
  Filled 2024-05-26: qty 1

## 2024-05-26 NOTE — ED Notes (Signed)
 [facility] called

## 2024-05-26 NOTE — ED Provider Notes (Signed)
 [place] EMERGENCY DEPARTMENT AT [facility] Provider Note   CSN: [id number] Arrival date & time: 05/26/24  1914     Patient presents with: Back Pain and Shortness of Breath   Peggy Armstrong is a 78 y.o. female presented to ED with complaint of shortness of breath.  Patient was seen in the ED approximately 9 days ago with similar presentation.  She reports that she has a subjective feeling she cannot take a full breath in.  She denies any coughing or fevers.  She says she does have a history of blood clots in the past was taken off of Eliquis  due to GI bleeding issues.  Incidentally, she is having worsening pain in her back, which she has known multiple vertebral fractures.  She is scheduled for kyphoplasty in 4 days with a neurosurgeon.  She has a fentanyl  patch in place and also takes oral pain medicine with 10 mg oxycodones, says her pain is still quite significant.  I reviewed external records including most recent hospitalization and discharge 1 month ago.  She does have a history of a flutter, duodenal adenocarcinoma, GI bleed with discontinuing anticoagulation, chronic thrombocytopenia, anxiety, hypothyroidism, chronic heart failure.  She was treated with antibiotics for community-acquired pneumonia while in the hospital.   HPI     Prior to Admission medications   Medication Sig Start Date End Date Taking? Authorizing Provider  acetaminophen  (TYLENOL ) 500 MG tablet Take 2 tablets (1,000 mg total) by mouth every 8 (eight) hours. Patient taking differently: Take 1,000 mg by mouth every 6 (six) hours as needed for mild pain (pain score 1-3) or moderate pain (pain score 4-6). 02/23/24   Krishnan, Gokul, MD  albuterol  (VENTOLIN  HFA) 108 [phone] Base) MCG/ACT inhaler Inhale 1-2 puffs into the lungs every 6 (six) hours as needed for wheezing or shortness of breath.    [provider]  allopurinol  (ZYLOPRIM ) 300 MG tablet Take 1 tablet (300 mg total) by mouth daily.  07/18/23   Rothfuss, Jacob T, PA-C  allopurinol  (ZYLOPRIM ) 300 MG tablet Take 1 tablet (300 mg total) by mouth daily. 01/26/24     amiodarone  (PACERONE ) 200 MG tablet Take 1 tablet (200 mg total) by mouth 2 (two) times daily. 09/01/23   Croitoru, Jerel, MD  apixaban  (ELIQUIS ) 5 MG TABS tablet Take 1 tablet (5 mg total) by mouth 2 (two) times daily for AFIB 12/25/23     Ascorbic Acid (VITAMIN C PO) Take 250 mg by mouth in the morning.    [provider]  atorvastatin  (LIPITOR) 20 MG tablet Take 1 tablet (20 mg total) by mouth daily. 08/11/23     busPIRone  (BUSPAR ) 10 MG tablet Take 1 tablet (10 mg total) by mouth 2 (two) times daily. 08/22/23   Rothfuss, Jacob T, PA-C  busPIRone  (BUSPAR ) 5 MG tablet Take 1 tablet (5 mg total) by mouth 3 (three) times daily as needed (anxiety). 04/23/24   Barbarann Nest, MD  Calcium  Carb-Cholecalciferol  (CALCIUM  600 + D PO) Take 1 tablet by mouth in the morning.    [provider]  Cholecalciferol  (VITAMIN D3 PO) Take 1,000 mcg by mouth in the morning.    [provider]  cyanocobalamin  (VITAMIN B12) 1000 MCG tablet Take 1,000 mcg by mouth daily.    [provider]  diltiazem  (CARDIZEM  CD) 120 MG 24 hr capsule Take 1 capsule (120 mg total) by mouth daily. Patient taking differently: Take 120 mg by mouth at bedtime. 04/05/24 07/04/24  Vernon Ranks, MD  DULoxetine  (CYMBALTA ) 20 MG capsule TAKE 1 CAPSULE BY MOUTH EVERY DAY 10/23/23   Rothfuss, Jacob T, PA-C  DULoxetine  (CYMBALTA ) 20 MG capsule Take 1 capsule (20 mg total) by mouth daily. 01/26/24     DULoxetine  (CYMBALTA ) 20 MG capsule Take 1 capsule (20 mg total) by mouth daily. 10/23/23   Rothfuss, Jacob T, PA-C  fentaNYL  (DURAGESIC ) 25 MCG/HR Place 1 patch onto the skin every 3 (three) days. 04/29/24   Ezzard Valaria LABOR, MD  ferrous sulfate  325 (65 FE) MG tablet Take 325 mg by mouth daily with breakfast.    [provider]  levothyroxine  (SYNTHROID ) 100 MCG tablet Take 1 tablet (100  mcg total) by mouth daily before breakfast. 07/18/23   Rothfuss, Jacob T, PA-C  melatonin 3 MG TABS tablet Take 1 tablet (3 mg total) by mouth at bedtime as needed (insomnia). Patient taking differently: Take 3 mg by mouth at bedtime. 05/18/23   Krishnan, Gokul, MD  methocarbamol  (ROBAXIN ) 500 MG tablet TAKE 1 TABLET BY MOUTH EVERY 6 HOURS AS NEEDED FOR MUSCLE SPASMS. 10/03/23   Rothfuss, Jacob T, PA-C  Oxycodone  HCl 10 MG TABS Take 1 tablet (10 mg total) by mouth every 4 (four) to 6 (six) hours as needed for pain. 04/29/24   Lewis, Dequincy A, MD  pantoprazole  (PROTONIX ) 40 MG tablet Take 1 tablet (40 mg total) by mouth 2 (two) times daily. 02/23/24   Krishnan, Gokul, MD  Potassium Chloride  ER 20 MEQ TBCR Take 1 tablet (20 mEq total) by mouth daily. 04/23/24   Barbarann Nest, MD  torsemide  (DEMADEX ) 20 MG tablet Take torsemide  on alternating days 40 mg one day (2 tablets), then the next day 20 mg (1 tablet) 09/21/23   Madireddy, Alean SAUNDERS, MD    Allergies: Codeine, Tape, and Oxycodone     Review of Systems  Updated Vital Signs BP (!) 140/80   Pulse 65   Temp 98 F (36.7 C) (Oral)   Resp 13   Ht 5' (1.524 m)   Wt 71.7 kg   SpO2 100%   BMI 30.86 kg/m   Physical Exam Constitutional:      General: She is not in acute distress. HENT:     Head: Normocephalic and atraumatic.  Eyes:     Conjunctiva/sclera: Conjunctivae normal.     Pupils: Pupils are equal, round, and reactive to light.  Cardiovascular:     Rate and Rhythm: Normal rate and regular rhythm.  Pulmonary:     Effort: Pulmonary effort is normal. No respiratory distress.     Comments: No audible wheezing on exam, no hypoxia Abdominal:     General: There is no distension.     Tenderness: There is no abdominal tenderness.  Skin:    General: Skin is warm and dry.  Neurological:     General: No focal deficit present.     Mental Status: She is alert. Mental status is at baseline.  Psychiatric:        Mood and Affect: Mood  normal.        Behavior: Behavior normal.     (all labs ordered are listed, but only abnormal results are displayed) Labs Reviewed  BASIC METABOLIC PANEL WITH GFR - Abnormal; Notable for the following components:      Result Value   Glucose, Bld 101 (*)    Calcium  8.5 (*)    All other components within normal limits  CBC WITH DIFFERENTIAL/PLATELET - Abnormal; Notable for the following components:   RBC 3.64 (*)  Hemoglobin 9.8 (*)    HCT 33.4 (*)    MCHC 29.3 (*)    RDW 16.6 (*)    Platelets 96 (*)    Lymphs Abs 0.3 (*)    All other components within normal limits  BRAIN NATRIURETIC PEPTIDE - Abnormal; Notable for the following components:   B Natriuretic Peptide 303.2 (*)    All other components within normal limits  RESP PANEL BY RT-PCR (RSV, FLU A&B, COVID)  RVPGX2  TROPONIN I (HIGH SENSITIVITY)    EKG: EKG Interpretation Date/Time:  Sunday May 26 2024 20:26:25 EST Ventricular Rate:  67 PR Interval:  182 QRS Duration:  110 QT Interval:  486 QTC Calculation: 514 R Axis:   -34  Text Interpretation: Sinus rhythm Left axis deviation Probable anterior infarct, age indeterminate Prolonged QT interval Confirmed by Cottie Cough [phone]) on 05/26/2024 8:47:50 PM  Radiology: CT Angio Chest PE W and/or Wo Contrast Result Date: 05/26/2024 EXAM: CTA CHEST 05/26/2024 09:41:36 PM TECHNIQUE: CTA of the chest was performed after the administration of intravenous contrast. Multiplanar reformatted images are provided for review. MIP images are provided for review. Automated exposure control, iterative reconstruction, and/or weight based adjustment of the mA/kV was utilized to reduce the radiation dose to as low as reasonably achievable. COMPARISON: AP and lateral chest studies from 04/22/2024 and 05/17/2024, CTA chest 03/12/2024, PET CT from 12/20/2023, chest CT without contrast 03/29/2017. CLINICAL HISTORY: Pulmonary embolism (PE) suspected, high prob. Shortness of breath.  History of descending duodenal neoplasm. FINDINGS: PULMONARY ARTERIES: Pulmonary arteries are adequately opacified for evaluation. The pulmonary trunk is 2.7 cm and both main pulmonary arteries measure 3 cm indicating arterial hypertension, seen previously. No arterial embolism is seen to the segmental level, but the subsegmental arteries are obscured due to breathing motion throughout. MEDIASTINUM: There is mild to moderate cardiomegaly with a left chamber predominance. The heart is slightly less enlarged than previously. There is no pericardial effusion. There are mild scattered 3-vessel coronary calcifications, but no visible heavy plaques. The aorta and great vessels are tortuous with scattered calcific plaques without aneurysm, dissection, or stenosis, with mild nonaneurysmal aortic ectasia unchanged. The pulmonary veins are nondilated. LYMPH NODES: No mediastinal, hilar or axillary lymphadenopathy. LUNGS AND PLEURA: There is diffuse bronchial thickening. There is increased mosaicism of the lung fields most likely due to air trapping. Mild posterior atelectasis in both lungs. There is no consolidation. There is no pneumothorax. There is a small layering right pleural effusion, significantly improved from 03/12/2024 with interval resolution of the prior left pleural effusion. There is a chronic 4 mm subpleural left upper lobe nodule on series 6 axial 47, a 3 mm left upper lobe nodule slightly anterior to this on image 46 which was seen on this year's studies but not in 2018, and a 5 mm lingular nodule on image 52, which was present this year but not in 2018. There are no other visible nodules. UPPER ABDOMEN: Limited images of the upper abdomen demonstrate a moderate-sized hiatal hernia with about one-fourth of the stomach intrathoracic. There is a small amount of free fluid collecting in the sac to the right as before. Pneumobilia is chronically noted, with prior cholecystectomy. There is a Bosniak 1 cyst in the  superior pole of the left kidney measuring 2.8 cm, 2 HU. No acute upper abdominal findings. SOFT TISSUES AND BONES: There is osteopenia, kyphosis, degenerative change and multilevel bridging enthesopathy of the thoracic spine. No acute or suspicious osseous findings. There is bilateral moderate arthrosis of  the shoulders and a chronic proximal left humeral fracture malunion. No acute soft tissue abnormality. IMPRESSION: 1. No evidence of pulmonary embolism to the segmental level; subsegmental arteries are obscured by breathing motion. Stable prominence of the central arteries. 2. Mild to moderate cardiomegaly with left chamber predominance, slightly less enlarged than previously. No pericardial effusion. No acute edema. 3. Moderate-sized hiatal hernia with a small amount of free fluid in the right-sided hernia sac. 4. Small layering right pleural effusion, decreased compared to 03/12/2024, with interval resolution of the prior left pleural effusion. 5. Small solid pulmonary nodules measuring up to 5 mm, 2 of which were present on studies from this year but not seen in 2018. given the patient's cancer history, an optional CT at 6-12 months may be considered. Electronically signed by: Francis Quam MD 05/26/2024 10:20 PM EST RP Workstation: [id number]     Procedures   Medications Ordered in the ED  fentaNYL  (SUBLIMAZE ) injection 25 mcg (25 mcg Intravenous Given 05/26/24 2025)  ketorolac  (TORADOL ) 15 MG/ML injection 15 mg (15 mg Intravenous Given 05/26/24 2027)  iohexol  (OMNIPAQUE ) 350 MG/ML injection 75 mL (75 mLs Intravenous Contrast Given 05/26/24 2142)    Clinical Course as of 05/26/24 2253  Sun May 26, 2024  2223 No large central PE, neg troponins - BNP stable with very mild elevation; hgb at baseline.  She is on baseline oxygen. She is stable for discharge from this standpoint and hopefully can proceed with her surgery in 4 days to help with her back pain [MT]    Clinical Course User Index [MT]  Mar Walmer, Donnice PARAS, MD                                 Medical Decision Making Amount and/or Complexity of Data Reviewed Labs: ordered. Radiology: ordered. ECG/medicine tests: ordered.  Risk Prescription drug management.   This patient presents to the ED with concern for shortness of breath. This involves an extensive number of treatment options, and is a complaint that carries with it a high risk of complications and morbidity.  The differential diagnosis includes congestive heart failure versus pneumonia versus pulmonary embolism versus anemia versus other  Viral illness including COVID-19 is also on the differential  Co-morbidities that complicate the patient evaluation: History of heart failure   External records from outside source obtained and reviewed including hospital discharge summary last month  I ordered and personally interpreted labs.  The pertinent results include: No emergent findings.  Labs are stable and near baseline levels.  COVID and flu are negative  I ordered imaging studies including CT PE study I independently visualized and interpreted imaging which showed stable pulmonary nodule, small stable pulmonary pleural effusion, no focal infiltrate, no acute PE I agree with the radiologist interpretation  The patient was maintained on a cardiac monitor.  I personally viewed and interpreted the cardiac monitored which showed an underlying rhythm of: Sinus rhythm  Per my interpretation the patient's ECG shows no acute ischemic findings  I have reviewed the patients home medicines and have made adjustments as needed  Test Considered: Doubt aortic dissection, doubt intra-abdominal emergency, doubt sepsis   I do not see evidence of COPD, congestive heart failure, or other life-threatening cause of shortness of breath.  She has a very nonspecific feeling of dyspnea at home, which I suspect is likely being driven by anxiety at this point.  This may also be due to  chronic  pain that she has in her back.  I am hopeful that she is able to proceed with her kyphoplasty procedure this week.  She will need pizza or transportation home.        Final diagnoses:  Shortness of breath    ED Discharge Orders     None          Cottie Donnice PARAS, MD 05/26/24 2253

## 2024-05-26 NOTE — Discharge Instructions (Addendum)
 Your blood test and CT scan were reassuring did not show any medical emergencies.  Your flu and COVID test were negative.  You can proceed with your regular schedule surgery for your back pain.  I recommend you follow-up with your primary care doctor as well for this ongoing feeling of shortness of breath.  In the ER, your vital signs and oxygen levels were normal and reassuring.

## 2024-05-26 NOTE — ED Triage Notes (Signed)
 Pt coming from home via ems for evaluation of lower left back pain x1 year accompanied with shortness of breath x1 week. Pt was 89% on RA and placed on 1 lpm [place] by ems and rose to 100%. BP 142/100, HR 68, CBG159. Pt has home fentanyl  patch on left bicep.

## 2024-05-27 ENCOUNTER — Other Ambulatory Visit (HOSPITAL_BASED_OUTPATIENT_CLINIC_OR_DEPARTMENT_OTHER): Payer: Self-pay | Admitting: Student

## 2024-05-27 ENCOUNTER — Other Ambulatory Visit (HOSPITAL_BASED_OUTPATIENT_CLINIC_OR_DEPARTMENT_OTHER): Payer: Self-pay

## 2024-05-27 ENCOUNTER — Encounter (HOSPITAL_COMMUNITY): Payer: Self-pay | Admitting: Neurosurgery

## 2024-05-27 DIAGNOSIS — E039 Hypothyroidism, unspecified: Secondary | ICD-10-CM

## 2024-05-27 MED ORDER — LEVOTHYROXINE SODIUM 100 MCG PO TABS
100.0000 ug | ORAL_TABLET | Freq: Every day | ORAL | 3 refills | Status: AC
  Filled 2024-05-27: qty 90, 90d supply, fill #0
  Filled 2024-07-23 (×2): qty 90, 90d supply, fill #1

## 2024-05-27 NOTE — Progress Notes (Signed)
 SDW CALL  Patient was given pre-op instructions over the phone. The opportunity was given for the patient to ask questions. No further questions asked. Patient verbalized understanding of instructions given.   PCP - [name] Iven RIGGERS Cardiologist - Alean Madireedy,MD  PPM/ICD - denies Device Orders -  Rep Notified -   Chest x-ray -CT 05/26/24;2 view 05/17/24  EKG - 05/26/24 Stress Test -denies  ECHO - 04/22/24 Cardiac Cath - denies  Sleep Study - denies CPAP -   Fasting Blood Sugar - na Checks Blood Sugar _____ times a day  Blood Thinner Instructions:na Aspirin Instructions:na  ERAS Protcol -NPO PRE-SURGERY Ensure or G2-   COVID TEST- na   Anesthesia review: yes- Hx afib- not on Eliquis , EKG 05/26/24 showed SR with prolonged QT; ER visit 11/30 for SOB-CBC-Hgb 9.8,platelets 96.  Patient denies shortness of breath, fever, cough and chest pain over the phone call   Special instructions:    Oral Hygiene is also important to reduce your risk of infection.  Remember - BRUSH YOUR TEETH THE MORNING OF SURGERY WITH YOUR REGULAR TOOTHPASTE

## 2024-05-28 ENCOUNTER — Other Ambulatory Visit: Payer: Self-pay

## 2024-05-28 NOTE — Progress Notes (Signed)
 Anesthesia Chart Review: Same day workup  78 year old female with pertinent history including duodenal adenocarcinoma [not a surgical candidate and is currently being evaluated for chemo versus radiation therapy],  HFpEF, paroxysmal atrial fibrillation/flutter on rhythm control with amiodarone , not on anticoagulation given history of recurrent GI bleed [discontinued August 2025], AAA, HTN, GERD on PPI, moderate hiatal hernia, rheumatoid arthritis, CKD stage III, remote history of DVT, dyslipidemia, hypothyroidism,  chronic thrombocytopenia, chronic back pain on fentanyl  patch 25 mcg and oxycodone  10 mg every 4-6 hours as needed.  Echocardiogram 02/15/2023 LVEF 60 to 65%, grade 1 diastolic dysfunction, biatrial dilatation, mild to moderate TR, ascending aorta 3.8 cm and repeat limited echocardiogram 04/22/2024 showed no significant changes, noted RV mildly dilated with mildly reduced function.   Recent admission 10/27 to 04/23/2024 for community-acquired pneumonia with left lower lobe infiltrate.  Treated with ceftriaxone , azithromycin , Lasix , DuoNebs.  She was seen in the ED on 05/26/2024 with nonspecific complaint of shortness of breath.  Reported feeling that she could not get a full breath.  CT negative for PE, troponins negative, BNP stable with very mild elevation, hemoglobin at baseline.  Symptoms felt possibly secondary to anxiety as well as chronic back pain.  She was discharged for outpatient follow-up and advised to proceed with kyphoplasty as planned.  Patient will need day of surgery evaluation.  BMP and CBC 05/26/2024 reviewed, chronic anemia hemoglobin 9.8, chronic thrombocytopenia platelets 96k, otherwise unremarkable.  EKG 05/26/2024: Sinus rhythm.  Rate 67.  LAD.  Probable anterior infarct, age-indeterminate.  Prolonged QT interval (QTc 514).  CTA chest 05/26/2024: IMPRESSION: 1. No evidence of pulmonary embolism to the segmental level; subsegmental arteries are obscured by  breathing motion. Stable prominence of the central arteries. 2. Mild to moderate cardiomegaly with left chamber predominance, slightly less enlarged than previously. No pericardial effusion. No acute edema. 3. Moderate-sized hiatal hernia with a small amount of free fluid in the right-sided hernia sac. 4. Small layering right pleural effusion, decreased compared to 03/12/2024, with interval resolution of the prior left pleural effusion. 5. Small solid pulmonary nodules measuring up to 5 mm, 2 of which were present on studies from this year but not seen in 2018. given the patient's cancer history, an optional CT at 6-12 months may be considered  Limited echo 04/22/2024: 1. Left ventricular ejection fraction, by estimation, is 60 to 65%. The  left ventricle has normal function. The left ventricle has no regional  wall motion abnormalities. There is mild concentric left ventricular  hypertrophy. Diastology was not assessed.   2. RVSP not assessed. Right ventricular systolic function is mildly  reduced. The right ventricular size is mildly enlarged.   3. The mitral valve is normal in structure. Trivial mitral valve  regurgitation.   4. The aortic valve is tricuspid. Aortic valve regurgitation is not  visualized.   5. The inferior vena cava is normal in size with greater than 50%  respiratory variability, suggesting right atrial pressure of 3 mmHg.   Conclusion(s)/Recommendation(s): Limited TTE for LV and RV systolic  function assessment. Normal LV systolic function. Mildly reduced RV  systolic function and mildly enlarged RV. Appears largely unchanged from  prior TTE.   TTE 02/15/2024: 1. Left ventricular ejection fraction, by estimation, is 60 to 65%. The  left ventricle has normal function. The left ventricle has no regional  wall motion abnormalities. Left ventricular diastolic parameters are  consistent with Grade I diastolic  dysfunction (impaired relaxation).   2. Right  ventricular systolic function is normal.  The right ventricular  size is normal. There is mildly elevated pulmonary artery systolic  pressure. The estimated right ventricular systolic pressure is 40.5 mmHg.   3. Left atrial size was moderately dilated.   4. Right atrial size was moderately dilated.   5. The mitral valve is normal in structure. No evidence of mitral valve  regurgitation. No evidence of mitral stenosis.   6. Tricuspid valve regurgitation is mild to moderate.   7. The aortic valve is tricuspid. There is mild calcification of the  aortic valve. Aortic valve regurgitation is not visualized. Aortic valve  sclerosis/calcification is present, without any evidence of aortic  stenosis.   8. Aortic dilatation noted. There is borderline dilatation of the  ascending aorta, measuring 38 mm.   9. The inferior vena cava is normal in size with greater than 50%  respiratory variability, suggesting right atrial pressure of 3 mmHg.      Lynwood Geofm RIGGERS [facility] Short Stay Center/Anesthesiology Phone [phone] 05/28/2024 11:36 AM

## 2024-05-28 NOTE — Anesthesia Preprocedure Evaluation (Signed)
 Anesthesia Evaluation  Patient identified by MRN, date of birth, ID band Patient awake    Reviewed: Allergy & Precautions, NPO status , Patient's Chart, lab work & pertinent test results  History of Anesthesia Complications Negative for: history of anesthetic complications  Airway Mallampati: III  TM Distance: >3 FB Neck ROM: Full    Dental  (+) Dental Advisory Given, Edentulous Upper, Edentulous Lower   Pulmonary shortness of breath, neg sleep apnea, neg COPD, neg recent URI, former smoker   breath sounds clear to auscultation       Cardiovascular hypertension, (-) angina +CHF  (-) Past MI  Rhythm:Regular  1. Left ventricular ejection fraction, by estimation, is 60 to 65%. The  left ventricle has normal function. The left ventricle has no regional  wall motion abnormalities. There is mild concentric left ventricular  hypertrophy. Diastology was not assessed.   2. RVSP not assessed. Right ventricular systolic function is mildly  reduced. The right ventricular size is mildly enlarged.   3. The mitral valve is normal in structure. Trivial mitral valve  regurgitation.   4. The aortic valve is tricuspid. Aortic valve regurgitation is not  visualized.   5. The inferior vena cava is normal in size with greater than 50%  respiratory variability, suggesting right atrial pressure of 3 mmHg.     Neuro/Psych neg Seizures PSYCHIATRIC DISORDERS       Neuromuscular disease    GI/Hepatic Neg liver ROS, PUD,GERD  Medicated and Controlled,,  Endo/Other  Hypothyroidism    Renal/GU Lab Results      Component                Value               Date                      NA                       138                 05/26/2024                K                        3.5                 05/26/2024                CO2                      25                  05/26/2024                GLUCOSE                  101 (H)             05/26/2024                 BUN                      12                  05/26/2024                CREATININE  0.83                05/26/2024                CALCIUM                   8.5 (L)             05/26/2024                GFR                      40.64 (L)           06/02/2017                EGFR                     61                  04/17/2024                GFRNONAA                 >60                 05/26/2024                Musculoskeletal  (+) Arthritis ,    Abdominal   Peds  Hematology  (+) Blood dyscrasia, anemia Lab Results      Component                Value               Date                      WBC                      4.2                 05/26/2024                HGB                      9.8 (L)             05/26/2024                HCT                      33.4 (L)            05/26/2024                MCV                      91.8                05/26/2024                PLT                      96 (L)              05/26/2024              Anesthesia Other Findings   Reproductive/Obstetrics  Anesthesia Physical Anesthesia Plan  ASA: 3  Anesthesia Plan: General   Post-op Pain Management: Minimal or no pain anticipated   Induction: Intravenous  PONV Risk Score and Plan: 4 or greater and Ondansetron  and Dexamethasone   Airway Management Planned: Oral ETT  Additional Equipment: None  Intra-op Plan:   Post-operative Plan: Extubation in OR  Informed Consent: I have reviewed the patients History and Physical, chart, labs and discussed the procedure including the risks, benefits and alternatives for the proposed anesthesia with the patient or authorized representative who has indicated his/her understanding and acceptance.     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  Echocardiogram 02/15/2023 LVEF 60 to 65%, grade 1 diastolic dysfunction, biatrial dilatation, mild to moderate TR, ascending aorta 3.8 cm and repeat limited echocardiogram 04/22/2024 showed no significant changes, noted RV mildly dilated with mildly reduced function.   Recent admission 10/27 to 04/23/2024 for community-acquired pneumonia with left lower lobe infiltrate.  Treated with ceftriaxone , azithromycin , Lasix , DuoNebs.  She was seen in the ED on 05/26/2024 with nonspecific complaint of shortness of breath.  Reported feeling that she could not get a full breath.  CT negative for PE, troponins negative, BNP stable with very mild elevation, hemoglobin at baseline.  Symptoms felt possibly secondary to anxiety as well as chronic back pain.  She was discharged for outpatient follow-up and advised to proceed with kyphoplasty as planned.  Patient will need day of surgery evaluation.  BMP and CBC 05/26/2024 reviewed, chronic anemia hemoglobin 9.8, chronic thrombocytopenia platelets 96k, otherwise unremarkable.  EKG 05/26/2024: Sinus rhythm.  Rate 67.  LAD.  Probable anterior infarct, age-indeterminate.  Prolonged QT interval (QTc 514).  CTA chest 05/26/2024: IMPRESSION: 1. No evidence of pulmonary embolism to the segmental level; subsegmental arteries are obscured by breathing motion. Stable prominence of the central arteries. 2. Mild to moderate cardiomegaly with left  chamber predominance, slightly less enlarged than previously. No pericardial effusion. No acute edema. 3. Moderate-sized hiatal hernia with a small amount of free fluid in the right-sided hernia sac. 4. Small layering right pleural effusion, decreased compared to 03/12/2024, with interval resolution of the prior left pleural effusion. 5. Small solid pulmonary nodules measuring up to 5 mm, 2 of which were present on studies from this year but not seen in 2018. given the patient's cancer history, an optional CT at 6-12 months may be considered  Limited echo 04/22/2024: 1. Left ventricular ejection fraction, by estimation, is 60 to 65%. The  left ventricle has normal function. The left ventricle has no regional  wall motion abnormalities. There is mild concentric left ventricular  hypertrophy. Diastology was not assessed.   2. RVSP not assessed. Right ventricular systolic function is mildly  reduced. The right ventricular size is mildly enlarged.   3. The mitral valve is normal in structure. Trivial mitral valve  regurgitation.   4. The aortic valve is tricuspid. Aortic valve regurgitation is not  visualized.  5. The inferior vena cava is normal in size with greater than 50%  respiratory variability, suggesting right atrial pressure of 3 mmHg.   Conclusion(s)/Recommendation(s): Limited TTE for LV and RV systolic  function assessment. Normal LV systolic function. Mildly reduced RV  systolic function and mildly enlarged RV. Appears largely unchanged from  prior TTE.   TTE 02/15/2024: 1. Left ventricular ejection fraction, by estimation, is 60 to 65%. The  left ventricle has normal function. The left ventricle has no regional  wall motion abnormalities. Left ventricular diastolic parameters are  consistent with Grade I diastolic  dysfunction (impaired relaxation).   2. Right ventricular systolic function is normal. The right ventricular  size is normal. There is mildly elevated  pulmonary artery systolic  pressure. The estimated right ventricular systolic pressure is 40.5 mmHg.   3. Left atrial size was moderately dilated.   4. Right atrial size was moderately dilated.   5. The mitral valve is normal in structure. No evidence of mitral valve  regurgitation. No evidence of mitral stenosis.   6. Tricuspid valve regurgitation is mild to moderate.   7. The aortic valve is tricuspid. There is mild calcification of the  aortic valve. Aortic valve regurgitation is not visualized. Aortic valve  sclerosis/calcification is present, without any evidence of aortic  stenosis.   8. Aortic dilatation noted. There is borderline dilatation of the  ascending aorta, measuring 38 mm.   9. The inferior vena cava is normal in size with greater than 50%  respiratory variability, suggesting right atrial pressure of 3 mmHg.     )         Anesthesia Quick Evaluation

## 2024-05-29 ENCOUNTER — Other Ambulatory Visit (HOSPITAL_BASED_OUTPATIENT_CLINIC_OR_DEPARTMENT_OTHER): Payer: Self-pay

## 2024-05-29 ENCOUNTER — Ambulatory Visit (HOSPITAL_COMMUNITY)

## 2024-05-29 ENCOUNTER — Encounter (HOSPITAL_COMMUNITY): Payer: Self-pay | Admitting: Neurosurgery

## 2024-05-29 ENCOUNTER — Ambulatory Visit (HOSPITAL_COMMUNITY): Admitting: Physician Assistant

## 2024-05-29 ENCOUNTER — Other Ambulatory Visit: Payer: Self-pay

## 2024-05-29 ENCOUNTER — Ambulatory Visit (HOSPITAL_COMMUNITY)
Admission: RE | Admit: 2024-05-29 | Discharge: 2024-05-29 | Disposition: A | Attending: Neurosurgery | Admitting: Neurosurgery

## 2024-05-29 ENCOUNTER — Encounter (HOSPITAL_COMMUNITY)
Admission: RE | Disposition: A | Discharge status: Home / Self Care | Payer: Self-pay | Source: Home / Self Care | Attending: Neurosurgery

## 2024-05-29 HISTORY — PX: KYPHOPLASTY: SHX5884

## 2024-05-29 LAB — SURGICAL PCR SCREEN
MRSA, PCR: POSITIVE — AB
Staphylococcus aureus: POSITIVE — AB

## 2024-05-29 SURGERY — KYPHOPLASTY
Anesthesia: General

## 2024-05-29 MED ORDER — LACTATED RINGERS IV SOLN: INTRAVENOUS | Status: DC

## 2024-05-29 MED ORDER — PHENYLEPHRINE HCL-NACL 20-0.9 MG/250ML-% IV SOLN
INTRAVENOUS | Status: DC | PRN
  Administered 2024-05-29: 50 ug/min via INTRAVENOUS

## 2024-05-29 MED ORDER — SUGAMMADEX SODIUM 200 MG/2ML IV SOLN
INTRAVENOUS | Status: DC | PRN
  Administered 2024-05-29: 200 mg via INTRAVENOUS
  Administered 2024-05-29: 100 mg via INTRAVENOUS

## 2024-05-29 MED ORDER — KETAMINE HCL 50 MG/5ML IJ SOSY
PREFILLED_SYRINGE | INTRAMUSCULAR | Status: AC
  Filled 2024-05-29: qty 5

## 2024-05-29 MED ORDER — PROPOFOL 500 MG/50ML IV EMUL
INTRAVENOUS | Status: DC | PRN
  Administered 2024-05-29: 100 ug/kg/min via INTRAVENOUS

## 2024-05-29 MED ORDER — FENTANYL CITRATE (PF) 100 MCG/2ML IJ SOLN
25.0000 ug | INTRAMUSCULAR | Status: DC | PRN
  Administered 2024-05-29 (×2): 25 ug via INTRAVENOUS

## 2024-05-29 MED ORDER — ACETAMINOPHEN 10 MG/ML IV SOLN
INTRAVENOUS | Status: AC
  Filled 2024-05-29: qty 100

## 2024-05-29 MED ORDER — ONDANSETRON HCL 4 MG/2ML IJ SOLN
INTRAMUSCULAR | Status: DC | PRN
  Administered 2024-05-29: 4 mg via INTRAVENOUS

## 2024-05-29 MED ORDER — CHLORHEXIDINE GLUCONATE CLOTH 2 % EX PADS: 6.0000 | MEDICATED_PAD | Freq: Once | CUTANEOUS | Status: DC

## 2024-05-29 MED ORDER — KETAMINE HCL 10 MG/ML IJ SOLN
INTRAMUSCULAR | Status: DC | PRN
  Administered 2024-05-29: 30 mg via INTRAVENOUS

## 2024-05-29 MED ORDER — LACTATED RINGERS IV SOLN: INTRAVENOUS | Status: DC | PRN

## 2024-05-29 MED ORDER — FENTANYL CITRATE (PF) 100 MCG/2ML IJ SOLN
INTRAMUSCULAR | Status: AC
  Filled 2024-05-29: qty 2

## 2024-05-29 MED ORDER — PROPOFOL 10 MG/ML IV BOLUS
INTRAVENOUS | Status: AC
  Filled 2024-05-29: qty 20

## 2024-05-29 MED ORDER — LIDOCAINE-EPINEPHRINE 1 %-1:100000 IJ SOLN
INTRAMUSCULAR | Status: AC
  Filled 2024-05-29: qty 1

## 2024-05-29 MED ORDER — CEFAZOLIN SODIUM-DEXTROSE 2-4 GM/100ML-% IV SOLN
2.0000 g | INTRAVENOUS | Status: AC
  Administered 2024-05-29: 2 g via INTRAVENOUS
  Filled 2024-05-29: qty 100

## 2024-05-29 MED ORDER — DEXAMETHASONE SOD PHOSPHATE PF 10 MG/ML IJ SOLN
INTRAMUSCULAR | Status: DC | PRN
  Administered 2024-05-29: 5 mg via INTRAVENOUS

## 2024-05-29 MED ORDER — FENTANYL CITRATE (PF) 250 MCG/5ML IJ SOLN
INTRAMUSCULAR | Status: DC | PRN
  Administered 2024-05-29: 25 ug via INTRAVENOUS
  Administered 2024-05-29: 50 ug via INTRAVENOUS
  Administered 2024-05-29: 25 ug via INTRAVENOUS

## 2024-05-29 MED ORDER — CHLORHEXIDINE GLUCONATE 0.12 % MT SOLN
15.0000 mL | Freq: Once | OROMUCOSAL | Status: AC
  Administered 2024-05-29: 15 mL via OROMUCOSAL
  Filled 2024-05-29: qty 15

## 2024-05-29 MED ORDER — PROPOFOL 10 MG/ML IV BOLUS
INTRAVENOUS | Status: DC | PRN
  Administered 2024-05-29: 90 mg via INTRAVENOUS

## 2024-05-29 MED ORDER — OXYCODONE-ACETAMINOPHEN 10-325 MG PO TABS
1.0000 | ORAL_TABLET | Freq: Four times a day (QID) | ORAL | 0 refills | Status: AC | PRN
  Filled 2024-05-29: qty 20, 5d supply, fill #0

## 2024-05-29 MED ORDER — ROCURONIUM BROMIDE 10 MG/ML (PF) SYRINGE
PREFILLED_SYRINGE | INTRAVENOUS | Status: DC | PRN
  Administered 2024-05-29: 40 mg via INTRAVENOUS

## 2024-05-29 MED ORDER — ACETAMINOPHEN 10 MG/ML IV SOLN
1000.0000 mg | Freq: Once | INTRAVENOUS | Status: DC | PRN
  Administered 2024-05-29: 1000 mg via INTRAVENOUS

## 2024-05-29 MED ORDER — IOPAMIDOL (ISOVUE-300) INJECTION 61%
INTRAVENOUS | Status: DC | PRN
  Administered 2024-05-29: 100 mL

## 2024-05-29 MED ORDER — LIDOCAINE 2% (20 MG/ML) 5 ML SYRINGE
INTRAMUSCULAR | Status: DC | PRN
  Administered 2024-05-29: 40 mg via INTRAVENOUS

## 2024-05-29 MED ORDER — ORAL CARE MOUTH RINSE: 15.0000 mL | Freq: Once | OROMUCOSAL | Status: AC

## 2024-05-29 MED ORDER — LIDOCAINE-EPINEPHRINE 1 %-1:100000 IJ SOLN
INTRAMUSCULAR | Status: DC | PRN
  Administered 2024-05-29: 4 mL

## 2024-05-29 SURGICAL SUPPLY — 31 items
BAG COUNTER SPONGE SURGICOUNT (BAG) ×1 IMPLANT
BLADE CLIPPER SURG (BLADE) IMPLANT
BLADE SURG 15 STRL LF DISP TIS (BLADE) ×1 IMPLANT
CEMENT KYPHON C01A KIT/MIXER (Cement) IMPLANT
DERMABOND ADVANCED .7 DNX12 (GAUZE/BANDAGES/DRESSINGS) ×1 IMPLANT
DEVICE BIOPSY BONE KYPHX (INSTRUMENTS) IMPLANT
DRAPE C-ARM 42X72 X-RAY (DRAPES) ×1 IMPLANT
DRAPE HALF SHEET 40X57 (DRAPES) ×1 IMPLANT
DRAPE INCISE IOBAN 66X45 STRL (DRAPES) ×1 IMPLANT
DRAPE LAPAROTOMY 100X72X124 (DRAPES) ×1 IMPLANT
DRAPE SURG 17X23 STRL (DRAPES) ×1 IMPLANT
DRAPE WARM FLUID 44X44 (DRAPES) ×1 IMPLANT
DURAPREP 26ML APPLICATOR (WOUND CARE) ×1 IMPLANT
GAUZE 4X4 16PLY ~~LOC~~+RFID DBL (SPONGE) ×1 IMPLANT
GLOVE BIOGEL PI IND STRL 7.5 (GLOVE) ×3 IMPLANT
GLOVE BIOGEL PI MICRO STRL 7 (GLOVE) ×1 IMPLANT
GOWN STRL REUS W/ TWL LRG LVL3 (GOWN DISPOSABLE) ×2 IMPLANT
GOWN STRL REUS W/ TWL XL LVL3 (GOWN DISPOSABLE) IMPLANT
GOWN STRL REUS W/TWL 2XL LVL3 (GOWN DISPOSABLE) IMPLANT
INTRODUCER DEVICE OSTEO LEVEL (INTRODUCER) IMPLANT
KIT BASIN OR (CUSTOM PROCEDURE TRAY) ×1 IMPLANT
KIT TURNOVER KIT B (KITS) ×1 IMPLANT
NDL HYPO 25X1 1.5 SAFETY (NEEDLE) ×1 IMPLANT
PACK SRG BSC III STRL LF ECLPS (CUSTOM PROCEDURE TRAY) ×1 IMPLANT
PAD ARMBOARD POSITIONER FOAM (MISCELLANEOUS) ×3 IMPLANT
SOLN 0.9% NACL POUR BTL 1000ML (IV SOLUTION) ×1 IMPLANT
SUT VICRYL 3-0 RB1 18 ABS (SUTURE) ×1 IMPLANT
SYR CONTROL 10ML LL (SYRINGE) ×2 IMPLANT
TOWEL GREEN STERILE (TOWEL DISPOSABLE) ×1 IMPLANT
TOWEL GREEN STERILE FF (TOWEL DISPOSABLE) ×1 IMPLANT
TRAY KYPHOPAK 15/3 ONESTEP 1ST (MISCELLANEOUS) IMPLANT

## 2024-05-29 NOTE — Transfer of Care (Signed)
 Immediate Anesthesia Transfer of Care Note  Patient: Peggy Armstrong  Procedure(s) Performed: KYPHOPLASTY LUMBAR ONE  Patient Location: PACU  Anesthesia Type:General  Level of Consciousness: awake, alert , and patient cooperative  Airway & Oxygen Therapy: Patient Spontanous Breathing and Patient connected to nasal cannula oxygen  Post-op Assessment: Report given to RN and Post -op Vital signs reviewed and stable  Post vital signs: Reviewed and stable  Last Vitals:  Vitals Value Taken Time  BP 167/85 05/29/24 09:55  Temp 36.7 C 05/29/24 09:55  Pulse 75 05/29/24 10:00  Resp 20 05/29/24 10:00  SpO2 96 % 05/29/24 10:00  Vitals shown include unfiled device data.  Last Pain:  Vitals:   05/29/24 0732  TempSrc:   PainSc: 10-Worst pain ever      Patients Stated Pain Goal: 6 (05/29/24 0732)  Complications: There were no known notable events for this encounter.

## 2024-05-29 NOTE — Discharge Summary (Addendum)
 Physician Discharge Summary  Patient ID: Peggy Armstrong MRN: [id number] DOB/AGE: 1945-09-21 78 y.o.  Admit date: 05/29/2024 Discharge date: 05/29/2024  Admission Diagnoses:  L1 compression fracture  Discharge Diagnoses:  Same Active Problems:   * No active hospital problems. *   Discharged Condition: Stable  Hospital Course:  Peggy Armstrong is a 78 y.o. female who underwent uncomplicated L1 kyphoplasty and was discharged home from PACU in stable condition  Treatments: Surgery - L1 kyphoplasty  Discharge Exam: Blood pressure (!) 167/85, pulse 77, temperature 98 F (36.7 C), resp. rate 12, height 5' (1.524 m), weight 71.7 kg, SpO2 97%. Awake, alert, oriented Speech fluent, appropriate CN grossly intact 5/5 BUE/BLE Wound c/d/i  Disposition: Discharge disposition: 01-Home or Self Care       Discharge Instructions     Call MD for:  redness, tenderness, or signs of infection (pain, swelling, redness, odor or green/yellow discharge around incision site)   Complete by: As directed    Call MD for:  temperature >100.4   Complete by: As directed    Diet - low sodium heart healthy   Complete by: As directed    Discharge instructions   Complete by: As directed    Walk at home as much as possible, at least 4 times / day   Increase activity slowly   Complete by: As directed    Lifting restrictions   Complete by: As directed    No lifting > 10 lbs   May shower / Bathe   Complete by: As directed    48 hours after surgery   May walk up steps   Complete by: As directed    No dressing needed   Complete by: As directed    Other Restrictions   Complete by: As directed    No bending/twisting at waist      Allergies as of 05/29/2024       Reactions   Codeine Nausea And Vomiting, Other (See Comments)   Made me very sick   Tape Other (See Comments)   Tears skin, as it is VERY THIN!!  - surgical tape   Oxycodone  Other (See Comments)   Hallucinations          Medication List     PAUSE taking these medications    apixaban  5 MG Tabs tablet Wait to take this until: May 30, 2024 Commonly known as: ELIQUIS  Take 1 tablet (5 mg total) by mouth 2 (two) times daily for AFIB       TAKE these medications    acetaminophen  500 MG tablet Commonly known as: TYLENOL  Take 2 tablets (1,000 mg total) by mouth every 8 (eight) hours. What changed:  when to take this reasons to take this   albuterol  108 (90 Base) MCG/ACT inhaler Commonly known as: VENTOLIN  HFA Inhale 2 puffs into the lungs every 6 (six) hours as needed for wheezing or shortness of breath.   allopurinol  300 MG tablet Commonly known as: ZYLOPRIM  Take 1 tablet (300 mg total) by mouth daily.   amiodarone  200 MG tablet Commonly known as: PACERONE  Take 1 tablet (200 mg total) by mouth 2 (two) times daily.   atorvastatin  20 MG tablet Commonly known as: LIPITOR Take 1 tablet (20 mg total) by mouth daily.   busPIRone  10 MG tablet Commonly known as: BUSPAR  Take 10 mg by mouth 3 (three) times daily as needed (anxiety).   CALCIUM  PO Take 1 tablet by mouth daily.   diltiazem  120 MG 24 hr capsule  Commonly known as: CARDIZEM  CD Take 1 capsule (120 mg total) by mouth daily. What changed: when to take this   DULoxetine  20 MG capsule Commonly known as: CYMBALTA  Take 1 capsule (20 mg total) by mouth daily.   DULoxetine  20 MG capsule Commonly known as: CYMBALTA  Take 1 capsule (20 mg total) by mouth daily.   fentaNYL  25 MCG/HR Commonly known as: DURAGESIC  Place 1 patch onto the skin every 3 (three) days.   IRON  PO Take 1 tablet by mouth daily.   levothyroxine  100 MCG tablet Commonly known as: SYNTHROID  Take 1 tablet (100 mcg total) by mouth daily before breakfast.   melatonin 3 MG Tabs tablet Take 1 tablet (3 mg total) by mouth at bedtime as needed (insomnia). What changed: when to take this   MELATONIN PO Take 1 tablet by mouth at bedtime.   Oxycodone  HCl 10 MG  Tabs Take 1 tablet (10 mg total) by mouth every 4 (four) to 6 (six) hours as needed for pain.   oxyCODONE -acetaminophen  10-325 MG tablet Commonly known as: Percocet Take 1 tablet by mouth every 6 (six) hours as needed for up to 5 days for pain.   pantoprazole  40 MG tablet Commonly known as: PROTONIX  Take 1 tablet (40 mg total) by mouth 2 (two) times daily.   Potassium Chloride  ER 20 MEQ Tbcr Take 1 tablet (20 mEq total) by mouth daily.   torsemide  20 MG tablet Commonly known as: DEMADEX  Take torsemide  on alternating days 40 mg one day (2 tablets), then the next day 20 mg (1 tablet) What changed:  how much to take how to take this when to take this   VITAMIN B-12 PO Take 1 tablet by mouth daily.   VITAMIN C PO Take 1 tablet by mouth daily.   VITAMIN D3 PO Take 1 capsule by mouth daily.               Discharge Care Instructions  (From admission, onward)           Start     Ordered   05/29/24 0000  No dressing needed        05/29/24 9248            Follow-up Information     Lanis Pupa, MD Follow up.   Specialty: Neurosurgery Contact information: [address] [place] [address] [phone]                 Signed: Pupa JAYSON Lanis 05/29/2024, 9:57 AM

## 2024-05-29 NOTE — H&P (Signed)
 Chief Complaint   Back pain  History of Present Illness  Mrs. Detert is a 78 year old woman I am seeing in follow-up, last seen about a year ago with an L2 compression fracture of unclear etiology. This was managed conservatively. In the interim, the patient has been diagnosed with biopsy confirmed duodenal adenocarcinoma. Unfortunately, she has been complaining of a few months now of relatively severe primarily left-sided low back pain. Multiple abdominal scanned and lumbar spine CT and MRI have demonstrated a new L1 compression fracture not present in April of this year. She was referred for neurosurgical evaluation.   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   Cellulitis of left arm 08/14/2023   Cholangitis (HCC)    Choledocholithiasis 03/22/2017   Chronic anticoagulation 02/12/2024   Chronic diastolic CHF (congestive heart failure) (HCC) 11/08/2019   Chronic low back pain 07/18/2023   CKD (chronic kidney disease) stage 3, GFR 30-59 ml/min (HCC) 06/09/2017   CKD (chronic kidney disease), stage III (HCC) 06/09/2017   Closed compression fracture of body of L1 vertebra (HCC) 02/10/2024   Closed compression fracture of L2 lumbar vertebra, initial encounter (HCC) 05/11/2023   Clotting disorder    right DVT     Congestive heart failure (HCC) 05/18/2023   Constipation 02/21/2024   Dehydration 05/12/2023   Depressive disorder 05/18/2023   Difficulty walking  05/19/2023   Disorder of bone 10/25/2023   Displacement of bone 10/25/2023   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   Duodenal mass    Duodenal ulcer    DVT (deep venous thrombosis) (HCC)    right DVT   Dysphagia 05/19/2023   Epigastric pain    Essential hypertension    Fall at home, initial encounter 10/21/2023   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   GAD (generalized anxiety disorder)    Gastric polyps 05/16/2023   Generalized weakness 05/12/2023   GI bleed 07/20/2023   Goals of care, counseling/discussion 02/20/2024   Gout    Gout    Hematemesis 11/05/2017   Hemorrhagic shock (HCC) 07/21/2023   History of depression 10/21/2023   History of DVT (deep vein thrombosis) 11/08/2019   History of GI bleed 10/21/2023   Hyperlipidemia 05/12/2023   Hypertension    Hypokalemia 07/03/2017   Hypothyroidism 05/12/2023   IDA (iron  deficiency anemia) 02/15/2024   Iron  deficiency anemia 08/02/2016   Left elbow avulsion fracture 10/21/2023   Left humeral fracture 10/21/2023   Long term current use of anticoagulant therapy 06/20/2023   Lumbar radiculopathy 12/15/2023   Macrocytic anemia 11/05/2017   Medication management 02/21/2024   Melena 11/05/2017   Morbid obesity (HCC) 11/08/2019   Mucosal abnormality of esophagus 02/14/2024   Muscle weakness 05/19/2023   Nausea & vomiting    Need for assistance with personal care 05/21/2023   Need for immunization against influenza 07/18/2019   Need for vaccination 04/23/2018   Obesity with body mass index 30 or greater 10/25/2023  Occult blood in stools 05/16/2023   Pain 02/21/2024   Palliative care encounter 02/20/2024   Paroxysmal atrial fibrillation (HCC) 05/12/2023   Peri-ampullary neoplasm    Poor dentition 08/14/2016   RA (rheumatoid arthritis) (HCC)    Reduced mobility 06/20/2023   Restless leg 12/02/2022   Right shoulder pain 07/26/2018   Routine general medical examination at a health care facility 12/13/2019   S/P ERCP  05/05/2017   Severe anemia 02/10/2024   SIRS (systemic inflammatory response syndrome) (HCC) 03/22/2017   Symbolic dysfunction 05/19/2023   Unsteadiness on feet 05/21/2023   Upper GI bleed 06/20/2023    Past Surgical History   Past Surgical History:  Procedure Laterality Date   ABDOMINAL HYSTERECTOMY     BIOPSY  05/16/2023   Procedure: BIOPSY;  Surgeon: Aneita Gwendlyn DASEN, MD;  Location: [facility];  Service: Gastroenterology;;   CHOLECYSTECTOMY     ENDOSCOPIC RETROGRADE CHOLANGIOPANCREATOGRAPHY (ERCP) WITH PROPOFOL  N/A 06/15/2017   Procedure: ENDOSCOPIC RETROGRADE CHOLANGIOPANCREATOGRAPHY (ERCP) WITH PROPOFOL ;  Surgeon: Teressa Toribio SQUIBB, MD;  Location: [facility];  Service: Endoscopy;  Laterality: N/A;   ESOPHAGOGASTRODUODENOSCOPY N/A 02/14/2024   Procedure: EGD (ESOPHAGOGASTRODUODENOSCOPY);  Surgeon: Wilhelmenia Aloha Raddle., MD;  Location: [facility];  Service: Gastroenterology;  Laterality: N/A;   ESOPHAGOGASTRODUODENOSCOPY (EGD) WITH PROPOFOL  N/A 03/24/2017   Procedure: ESOPHAGOGASTRODUODENOSCOPY (EGD) WITH PROPOFOL ;  Surgeon: Legrand Victory LITTIE DOUGLAS, MD;  Location: [facility];  Service: Gastroenterology;  Laterality: N/A;   ESOPHAGOGASTRODUODENOSCOPY (EGD) WITH PROPOFOL  N/A 11/07/2017   Procedure: ESOPHAGOGASTRODUODENOSCOPY (EGD) WITH PROPOFOL ;  Surgeon: Teressa Toribio SQUIBB, MD;  Location: [facility];  Service: Endoscopy;  Laterality: N/A;   ESOPHAGOGASTRODUODENOSCOPY (EGD) WITH PROPOFOL  N/A 05/16/2023   Procedure: ESOPHAGOGASTRODUODENOSCOPY (EGD) WITH PROPOFOL ;  Surgeon: Aneita Gwendlyn DASEN, MD;  Location: [facility];  Service: Gastroenterology;  Laterality: N/A;   ESOPHAGOGASTRODUODENOSCOPY (EGD) WITH PROPOFOL  N/A 07/21/2023   Procedure: ESOPHAGOGASTRODUODENOSCOPY (EGD) WITH PROPOFOL ;  Surgeon: Charlanne Groom, MD;  Location: [facility];  Service: Gastroenterology;  Laterality: N/A;   EUS N/A 04/06/2017   Procedure: UPPER ENDOSCOPIC ULTRASOUND (EUS) LINEAR;  Surgeon: Teressa Toribio SQUIBB,  MD;  Location: [facility];  Service: Endoscopy;  Laterality: N/A;  to evaluate duodenal mass   HEMOSTASIS CLIP PLACEMENT  07/21/2023   Procedure: HEMOSTASIS CLIP PLACEMENT;  Surgeon: Charlanne Groom, MD;  Location: [facility];  Service: Gastroenterology;;   HEMOSTASIS CONTROL  07/21/2023   Procedure: HEMOSTASIS CONTROL;  Surgeon: Charlanne Groom, MD;  Location:  [facility]COPY;  Service: Gastroenterology;;   HERNIA REPAIR     IR ANGIOGRAM VISCERAL SELECTIVE  07/22/2023   IR BILIARY DRAIN PLACEMENT WITH CHOLANGIOGRAM  03/25/2017   IR CONVERT BILIARY DRAIN TO INT EXT BILIARY DRAIN  04/03/2017   IR EMBO ART  VEN HEMORR LYMPH EXTRAV  INC GUIDE ROADMAPPING  07/22/2023   IR US  GUIDE VASC ACCESS RIGHT  07/22/2023   POLYPECTOMY  05/16/2023   Procedure: POLYPECTOMY;  Surgeon: Aneita Gwendlyn DASEN, MD;  Location: [facility];  Service: Gastroenterology;;    Social History   Social History   Tobacco Use   Smoking status: Former    Current packs/day: 0.00    Average packs/day: 1.5 packs/day for 44.0 years (66.0 ttl pk-yrs)    Types: Cigarettes    Start date: 1968    Quit date: 2012    Years since quitting: 13.9    Passive exposure: Past   Smokeless tobacco: Former   Tobacco comments:    Quit in 2012. Started at 20's. Cannot recall how many.   Vaping Use  Vaping status: Never Used  Substance Use Topics   Alcohol use: No   Drug use: No    Medications   Prior to Admission medications   Medication Sig Start Date End Date Taking? Authorizing Provider  acetaminophen  (TYLENOL ) 500 MG tablet Take 2 tablets (1,000 mg total) by mouth every 8 (eight) hours. Patient taking differently: Take 1,000 mg by mouth 2 (two) times daily as needed for fever or headache (pain). 02/23/24  Yes Krishnan, Gokul, MD  albuterol  (VENTOLIN  HFA) 108 (90 Base) MCG/ACT inhaler Inhale 2 puffs into the lungs every 6 (six) hours as needed for wheezing or shortness of breath.   Yes [provider]  allopurinol  (ZYLOPRIM ) 300  MG tablet Take 1 tablet (300 mg total) by mouth daily. 07/18/23  Yes Rothfuss, Jacob T, PA-C  amiodarone  (PACERONE ) 200 MG tablet Take 1 tablet (200 mg total) by mouth 2 (two) times daily. 09/01/23  Yes Croitoru, Mihai, MD  Ascorbic Acid (VITAMIN C PO) Take 1 tablet by mouth daily.   Yes [provider]  atorvastatin  (LIPITOR) 20 MG tablet Take 1 tablet (20 mg total) by mouth daily. 08/11/23  Yes   busPIRone  (BUSPAR ) 10 MG tablet Take 10 mg by mouth 3 (three) times daily as needed (anxiety).   Yes [provider]  CALCIUM  PO Take 1 tablet by mouth daily.   Yes [provider]  Cholecalciferol  (VITAMIN D3 PO) Take 1 capsule by mouth daily.   Yes [provider]  Cyanocobalamin  (VITAMIN B-12 PO) Take 1 tablet by mouth daily.   Yes [provider]  diltiazem  (CARDIZEM  CD) 120 MG 24 hr capsule Take 1 capsule (120 mg total) by mouth daily. Patient taking differently: Take 120 mg by mouth at bedtime. 04/05/24 07/04/24 Yes Pahwani, Fredia, MD  DULoxetine  (CYMBALTA ) 20 MG capsule Take 1 capsule (20 mg total) by mouth daily. 10/23/23  Yes Rothfuss, Jacob T, PA-C  fentaNYL  (DURAGESIC ) 25 MCG/HR Place 1 patch onto the skin every 3 (three) days. 04/29/24  Yes Lewis, Dequincy A, MD  Ferrous Sulfate  (IRON  PO) Take 1 tablet by mouth daily.   Yes [provider]  levothyroxine  (SYNTHROID ) 100 MCG tablet Take 1 tablet (100 mcg total) by mouth daily before breakfast. 05/27/24  Yes Rothfuss, Jacob T, PA-C  MELATONIN PO Take 1 tablet by mouth at bedtime.   Yes [provider]  Oxycodone  HCl 10 MG TABS Take 1 tablet (10 mg total) by mouth every 4 (four) to 6 (six) hours as needed for pain. 04/29/24  Yes Lewis, Dequincy A, MD  pantoprazole  (PROTONIX ) 40 MG tablet Take 1 tablet (40 mg total) by mouth 2 (two) times daily. 02/23/24  Yes Verdene Purchase, MD  Potassium Chloride  ER 20 MEQ TBCR Take 1 tablet (20 mEq total) by mouth daily. 04/23/24  Yes Barbarann Nest, MD   torsemide  (DEMADEX ) 20 MG tablet Take torsemide  on alternating days 40 mg one day (2 tablets), then the next day 20 mg (1 tablet) Patient taking differently: Take 20 mg by mouth daily. 09/21/23  Yes Madireddy, Alean SAUNDERS, MD  apixaban  (ELIQUIS ) 5 MG TABS tablet Take 1 tablet (5 mg total) by mouth 2 (two) times daily for AFIB Patient not taking: Reported on 05/29/2024 12/25/23     DULoxetine  (CYMBALTA ) 20 MG capsule Take 1 capsule (20 mg total) by mouth daily. Patient not taking: Reported on 05/29/2024 01/26/24     melatonin 3 MG TABS tablet Take 1 tablet (3 mg total) by mouth at bedtime as  needed (insomnia). Patient taking differently: Take 3 mg by mouth at bedtime. 05/18/23   Verdene Purchase, MD    Allergies   Allergies  Allergen Reactions   Codeine Nausea And Vomiting and Other (See Comments)    Made me very sick   Tape Other (See Comments)    Tears skin, as it is VERY THIN!!  - surgical tape   Oxycodone  Other (See Comments)    Hallucinations     Review of Systems  ROS  Neurologic Exam  Awake, alert, oriented Memory and concentration grossly intact Speech fluent, appropriate CN grossly intact Motor exam: Upper Extremities Deltoid Bicep Tricep Grip  Right 5/5 5/5 5/5 5/5  Left 5/5 5/5 5/5 5/5   Lower Extremities IP Quad PF DF EHL  Right 5/5 5/5 5/5 5/5 5/5  Left 5/5 5/5 5/5 5/5 5/5   Sensation grossly intact to LT  Imaging  MRI reveals chronic L2 fracture, and L1 vertebral body fracture with associated FLAIR signal change suggesting non-healing.   Impression  - 78 y.o. female with hx of duodenal adenocarcinoma and back pain likely related to L1 fracture  Plan  - Will proceed with kyphoplasty L1  I have reviewed the indications for the procedure as well as the details of the procedure and the expected postoperative course and recovery at length with the patient in the office. We have also reviewed in detail the risks, benefits, and alternatives to the procedure. All  questions were answered and DELESA KAWA provided informed consent to proceed.  Gerldine Maizes, MD [facility]

## 2024-05-29 NOTE — Op Note (Signed)
  NEUROSURGERY OPERATIVE NOTE   PREOP DIAGNOSIS: L1 compression fx   POSTOP DIAGNOSIS: Same  PROCEDURE: 1. L1 Kyphoplasty with biopsy  SURGEON: Dr. Gerldine Maizes, MD  ASSISTANT: None  ANESTHESIA: GETA  EBL: Minimal  SPECIMENS: None  DRAINS: None  COMPLICATIONS: None immediate  CONDITION: Hemodynamically stable to PCAU  HISTORY: Peggy Armstrong is a 78 y.o. yo female with severe back pain and imaging revealing new non-healing L1 compression fracture. Pt attempted conservative treatment without improvement and elected to undergo kyphoplasty. Risks, benefits, and alternatives to surgery were all reviewed in detail with the patient and family. After all questions were answered, informed consent was obtained and witnessed.  PROCEDURE IN DETAIL: After informed consent was obtained and witnessed, the patient was brought to the operating room. After induction of anesthesia, the patient was positioned on the operative table in the prone position. All pressure points were meticulously padded. Fluoroscopy was used to mark out the projection of the L1 pedicles on the skin. Skin incision was then marked out and prepped and draped in the usual sterile fashion.  After time out was conducted, stab incision was made and Jamshidi needle introduced. Under AP and lateral fluoroscopic guidance, the right L1 pedicle was canulated. Biopsy of the vertebral body was taken. Drill was then used to create a channel and the kyphoplasty balloon was placed and expanded to create a cavity. Approximately 3cc of PMMA cement was injected under fluoroscopy, taking care to preserve the posterior cortex. Trocar was then introduced and the Jamshidi needle removed.  Stab incision was then closed with 3-0 vicryl suture and standard skin glue. The patient was then transferred to the stretcher and taken to the PACU in stable hemodynamic condition.  At the end of the case all sponge, needle, and instrument counts were  correct.   Gerldine Maizes, MD [facility]

## 2024-05-30 ENCOUNTER — Encounter (HOSPITAL_COMMUNITY): Payer: Self-pay | Admitting: Neurosurgery

## 2024-05-30 LAB — SURGICAL PATHOLOGY

## 2024-05-30 NOTE — Anesthesia Postprocedure Evaluation (Signed)
 Anesthesia Post Note  Patient: Peggy Armstrong  Procedure(s) Performed: KYPHOPLASTY LUMBAR ONE     Patient location during evaluation: PACU Anesthesia Type: General Level of consciousness: awake and patient cooperative Pain management: pain level controlled Vital Signs Assessment: post-procedure vital signs reviewed and stable Respiratory status: spontaneous breathing, respiratory function stable and nonlabored ventilation Cardiovascular status: blood pressure returned to baseline and stable Postop Assessment: no apparent nausea or vomiting Anesthetic complications: no   There were no known notable events for this encounter.                  Trudy Kory

## 2024-06-03 ENCOUNTER — Encounter (HOSPITAL_BASED_OUTPATIENT_CLINIC_OR_DEPARTMENT_OTHER): Payer: Self-pay

## 2024-06-03 ENCOUNTER — Other Ambulatory Visit (HOSPITAL_BASED_OUTPATIENT_CLINIC_OR_DEPARTMENT_OTHER): Admitting: Radiology

## 2024-06-03 ENCOUNTER — Inpatient Hospital Stay (HOSPITAL_BASED_OUTPATIENT_CLINIC_OR_DEPARTMENT_OTHER)
Admission: RE | Admit: 2024-06-03 | Discharge: 2024-06-03 | Disposition: A | Source: Ambulatory Visit | Attending: Oncology | Admitting: Oncology

## 2024-06-03 DIAGNOSIS — C170 Malignant neoplasm of duodenum: Secondary | ICD-10-CM

## 2024-06-03 NOTE — Progress Notes (Deleted)
 [facility] at [facility] [address] [place],  [place]  [address] [phone]  Clinic Day:  04/29/2024  Referring physician: Iven [name] DASEN, PA-C   HISTORY OF PRESENT ILLNESS:  The patient is a 78 y.o. female who I recently began seeing for newly diagnosed duodenal adenocarcinoma.  She comes in today as she has severe back pain.  Of note, the patient has had both MRIs of her spine and CT scans of her abdomen done, for which no obvious mass was seen emanating from her small bowel.  However, severe spinal stenosis was seen in her L-spine recently.  The patient claims her back pain is severe throughout the day, for which she ranks it a 10 out of 10 in severity.  She has been taking hydrocodone /Tylenol , which has not been particularly effective.  Her daughter claims she is scheduled to see neurosurgery in the future, but the exact date has not been scheduled.  She denies having any abdominal pain.  Furthermore, she claims her appetite is decent.  PHYSICAL EXAM:  There were no vitals taken for this visit. Wt Readings from Last 3 Encounters:  05/29/24 158 lb (71.7 kg)  05/26/24 158 lb (71.7 kg)  05/17/24 157 lb (71.2 kg)   There is no height or weight on file to calculate BMI. Performance status (ECOG): 2 - Symptomatic, <50% confined to bed Physical Exam Constitutional:      Appearance: Normal appearance. She is not ill-appearing.     Comments: She has a chronically ill appearance.  She is in a wheelchair and appears to be in distress from pain. HENT:     Mouth/Throat:     Mouth: Mucous membranes are moist.     Pharynx: Oropharynx is clear. No oropharyngeal exudate or posterior oropharyngeal erythema.  Cardiovascular:     Rate and Rhythm: Normal rate and regular rhythm.     Heart sounds: No murmur heard.    No friction rub. No gallop.  Pulmonary:     Effort: Pulmonary effort is normal. No respiratory distress.     Breath sounds: Decreased air movement present.  Decreased breath sounds present. No wheezing, rhonchi or rales.  Abdominal:     General: Bowel sounds are normal. There is no distension.     Palpations: Abdomen is soft. There is no mass.     Tenderness: There is no abdominal tenderness.  Musculoskeletal:        General: No swelling.     Right lower leg: No edema.     Left lower leg: No edema.  Lymphadenopathy:     Cervical: No cervical adenopathy.     Upper Body:     Right upper body: No supraclavicular or axillary adenopathy.     Left upper body: No supraclavicular or axillary adenopathy.     Lower Body: No right inguinal adenopathy. No left inguinal adenopathy.  Skin:    General: Skin is warm.     Coloration: Skin is not jaundiced.     Findings: No lesion or rash.  Neurological:     General: No focal deficit present.     Mental Status: She is alert and oriented to person, place, and time. Mental status is at baseline.  Psychiatric:        Mood and Affect: Mood normal.        Behavior: Behavior normal.        Thought Content: Thought content normal.   SCANS: CT scans of abdomen/pelvis on 04/01/2024 revealed the following:  FINDINGS: CT ABDOMEN AND PELVIS WITH CONTRAST FINDINGS   Lower chest: There is a small layering right pleural effusion, significantly improved. There is posterior atelectasis in the lung bases. No focal pneumonia.   There is mild cardiomegaly. Coronary artery calcifications. No pericardial effusion. There is a moderate-sized hiatal hernia, small amount of fluid again in the hernia sac.   Hepatobiliary: No focal liver abnormality is seen. Status post cholecystectomy. There is chronic extrahepatic biliary prominence with common bile duct again 10 mm, with pneumobilia again seen.   Pancreas: No mass or ductal dilatation or inflammation. Partial glandular atrophy.   Spleen: Borderline prominent. There are numerous scattered subcentimeter hypodensities which are too small to characterize and were not  well seen on the last study but probably present at that time, with a definitive increase in the number of lesions compared with CTA abdomen and pelvis 07/20/2023.   Findings are nonspecific. Possible etiologies include infectious and neoplastic involvement.   Adrenals/Urinary Tract: There is no adrenal mass. There is a 2.7 cm Bosniak 1 cyst in the medial upper pole left kidney, Hounsfield density is 9, and occasional few bilateral Bosniak 2 subcentimeter cortical cysts which are too small to characterize.   There is no mass enhancement. There is no urinary stone or obstruction. Unremarkable bladder.   Stomach/Bowel: Negative for dilatation or wall thickening. An appendix is not seen in this patient. Scattered sigmoid diverticulosis without diverticulitis.   Vascular/Lymphatic: Aortic atherosclerosis. No enlarged abdominal or pelvic lymph nodes.   Reproductive: Status post hysterectomy. No adnexal masses.   Other: None.   Musculoskeletal: Osteopenia, degenerative and posttraumatic changes of the spine. 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 T   Here are healed fractures of the posteromedial right tenth through twelfth ribs. Mild hip DJD. There are healed fracture deformities of the left pubic rami.   CT LUMBAR SPINE WITHOUT CONTRAST FINDINGS   Segmentation: Standard.   Alignment: Mild dextroscoliosis apex at L2. Mild grade 1 degenerative anterolisthesis L3-4.   6 mm chronic posterosuperior vertebral cortical retropulsion at L2 due to prior L2 burst fracture.   No new or worsening alignment abnormality.  No further listhesis.   Vertebrae: Diffuse osteopenia. 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   No focal pathologic lesion. As above there is partial hemangiomatous replacement of the T11 and T12 vertebrae with thoracic spine bridging enthesopathy 08/06/2010.   No interval worsening  is seen in the previously noted L1 upper plate anterior wedge compression fracture.   Anterior height loss is 20%, central height loss is 25-30%, posterior height loss about 15%.   There is mild posterosuperior cortical retropulsion eccentric to the left which has slightly increased, with increased encroachment on the left subarticular zone.   AP thecal sac is 10 mm in the midline. L2 burst fracture with 6 mm posterosuperior cortical retropulsion is unchanged with moderate spinal canal and subarticular zone effacement.   This is unchanged as is a mild upper plate concave compression fracture deformity of L3.   L4 and L5 remain normal in heights.  There is lumbar spondylosis.   Paraspinal and other soft tissues: No acute finding.   Disc levels: Just below T12-L1 there is increased effacement of the left ventral thecal sac due to slight increased posterosuperior cortical retropulsion at L1, with encroachment on the subarticular zone. The disc itself is normal in height without herniation. There are slight facet spurs. The foramina are moderately stenotic.   Widened L1-2  disc space due to the prior L2 burst fracture. Posterosuperior L2 cortical retropulsion as described above with moderate encroachment on the spinal canal and subarticular zones.   Moderate to severe left and mild right L1-2 foraminal stenosis is seen with slight facet spurring.   The L2-3 and L3-4 discs are normal in height. There is mild foraminal stenosis and facet hypertrophy. There are mild disc bulge without herniation or canal stenosis.   At L4-5, the disc is normal in height. Grade 1 degenerative anterolisthesis is noted with moderate facet hypertrophy, ligamentous thickening, disc bulge and moderate spinal canal and lateral recess stenosis. Relatively mild foraminal stenosis.   At L5-S1 there is facet hypertrophy and inferior foraminal bulging of the disc with bilateral moderate to severe foraminal  stenosis, but no disc herniation or canal zone narrowing.   The SI joints are patent. No sacral insufficiency fracture seen. There is spurring of both SI joints.   IMPRESSION: 1. No acute findings in the abdomen or pelvis. 2. Small right pleural effusion, significantly improved. 3. Moderate-sized hiatal hernia. 4. Cardiomegaly with coronary artery and aortic atherosclerosis. 5. Numerous subcentimeter hypodensities in the spleen, too small to characterize but definitively increased in number compared with CTA abdomen and pelvis 07/20/2023. Findings are nonspecific, with possible etiologies including infectious and neoplastic involvement. 6. Osteopenia and degenerative change. 7. L1 upper plate anterior wedge compression fracture with 20% anterior height loss, 25-30% central height loss and 15% posterior height loss. There is mild posterosuperior cortical retropulsion eccentric to the left which has slightly increased, with increased encroachment on the left ventral thecal sac and subarticular zone. The overall vertebral height loss appears unchanged. 8. L2 burst fracture with 6 mm posterosuperior cortical retropulsion, unchanged. 9. L3 mild upper plate concave compression fracture deformity, unchanged. 10. 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 11. L5-S1 moderate to severe bilateral foraminal stenosis. Other levels with lesser foraminal stenosis as above. 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   LABS:      Latest Ref Rng & Units 05/26/2024    8:15 PM 05/17/2024    7:01 PM 04/29/2024    1:53 PM  CBC  WBC 4.0 - 10.5 K/uL 4.2  3.5  5.0   Hemoglobin 12.0 - 15.0 g/dL 9.8  9.3  9.8   Hematocrit 36.0 - 46.0 % 33.4  31.6  32.7   Platelets 150 - 400 K/uL 96  92  201       Latest Ref Rng & Units 05/26/2024    8:15 PM 05/17/2024    7:01 PM 04/29/2024    1:53 PM  CMP  Glucose 70 - 99 mg/dL 898  99  881   BUN 8 - 23 mg/dL 12  15  17    Creatinine 0.44 - 1.00 mg/dL 9.16  9.18   9.18   Sodium 135 - 145 mmol/L 138  140  139   Potassium 3.5 - 5.1 mmol/L 3.5  3.4  3.6   Chloride 98 - 111 mmol/L 104  105  101   CO2 22 - 32 mmol/L 25  26  28    Calcium  8.9 - 10.3 mg/dL 8.5  8.6  9.2   Total Protein 6.5 - 8.1 g/dL  6.1  6.4   Total Bilirubin 0.0 - 1.2 mg/dL  0.6  0.4   Alkaline Phos 38 - 126 U/L  156  209   AST 15 - 41 U/L  41  68   ALT 0 - 44 U/L  17  48  Lab Results  Component Value Date   CEA 4.18 11/17/2023     Latest Reference Range & Units 11/21/23 12:07  Iron  28 - 170 ug/dL 30  UIBC ug/dL 692  TIBC 749 - 549 ug/dL 662  Saturation Ratios 10.4 - 31.8 % 9 (L)  Ferritin 11 - 307 ng/mL 65  (L): Data is abnormally low  PATHOLOGY:  Her duodenal mass biopsy in April 2025 revealed the following: Diagnosis   A: Duodenum, mass, biopsy - 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  ASSESSMENT & PLAN:  A 78 y.o. female with duodenal adenocarcinoma.  However, this visit today was focused primarily on pain control.  Due to her immense back pain, I will place her on a Duragesic  25 mcg patch.  This is for long-acting pain relief.  The patient understands that this patch needs to be changed every 3 days.  With respect to immediate pain relief, I will prescribe her oxycodone  10 mg every 4-6 hours as needed.  She was told to take a laxative every time she takes oxycodone  to prevent severe constipation from being an issue.  Based upon recent scans, it does appear her primary issue is severe spinal stenosis.  Recent scans actually did not show any visual evidence of her duodenal adenocarcinoma getting larger or causing locoregional complications.  In  fact, it was not really commented upon on a CT scan done less than a month ago.  She knows to discuss her back situation with neurosurgery to see if there is any procedure/surgery that could be given to alleviate her back pain.  Otherwise, I will see this patient back in approximately 1 month.  A PET scan will be done a day before her next visit to see if her duodenal adenocarcinoma has shown any signs of local disease progression or overt metastasis.  The patient and her family understand all the plans discussed today and are in agreement with them.    Rolland Steinert DELENA Kerns, MD

## 2024-06-04 ENCOUNTER — Other Ambulatory Visit (HOSPITAL_BASED_OUTPATIENT_CLINIC_OR_DEPARTMENT_OTHER): Payer: Self-pay | Admitting: Student

## 2024-06-04 ENCOUNTER — Other Ambulatory Visit (HOSPITAL_BASED_OUTPATIENT_CLINIC_OR_DEPARTMENT_OTHER): Payer: Self-pay

## 2024-06-04 ENCOUNTER — Inpatient Hospital Stay: Attending: Oncology | Admitting: Oncology

## 2024-06-04 DIAGNOSIS — M545 Low back pain, unspecified: Secondary | ICD-10-CM

## 2024-06-05 ENCOUNTER — Other Ambulatory Visit (HOSPITAL_BASED_OUTPATIENT_CLINIC_OR_DEPARTMENT_OTHER): Payer: Self-pay

## 2024-06-05 NOTE — Telephone Encounter (Signed)
 [name] daughter per DPR said pt had injection on the cement she has on back last week. She does not see neurosurgeon until 06/13/2024. She does not have fentanyl  patches, is out of steroids, and oxycodone . She tried to reach out to neurosurgeon for refill on oxycodone . [name] states requests are slow there. Is asking if Jacob can send her in something for her pain?

## 2024-06-06 ENCOUNTER — Other Ambulatory Visit (HOSPITAL_BASED_OUTPATIENT_CLINIC_OR_DEPARTMENT_OTHER): Payer: Self-pay

## 2024-06-06 ENCOUNTER — Other Ambulatory Visit: Payer: Self-pay

## 2024-06-06 MED ORDER — METHOCARBAMOL 500 MG PO TABS
500.0000 mg | ORAL_TABLET | Freq: Four times a day (QID) | ORAL | 0 refills | Status: DC | PRN
  Filled 2024-06-06: qty 90, 23d supply, fill #0

## 2024-06-06 NOTE — Telephone Encounter (Signed)
 Peggy Armstrong advised methocarbamol  was sent in. She said her and [name] spoke this morning.

## 2024-06-14 ENCOUNTER — Other Ambulatory Visit (HOSPITAL_BASED_OUTPATIENT_CLINIC_OR_DEPARTMENT_OTHER): Payer: Self-pay

## 2024-06-14 ENCOUNTER — Ambulatory Visit (INDEPENDENT_AMBULATORY_CARE_PROVIDER_SITE_OTHER): Admitting: Student

## 2024-06-14 ENCOUNTER — Encounter (HOSPITAL_BASED_OUTPATIENT_CLINIC_OR_DEPARTMENT_OTHER): Payer: Self-pay | Admitting: Student

## 2024-06-14 VITALS — BP 145/84 | HR 68 | Temp 97.9°F | Resp 16 | Ht 60.0 in | Wt 154.9 lb

## 2024-06-14 DIAGNOSIS — G8929 Other chronic pain: Secondary | ICD-10-CM | POA: Diagnosis not present

## 2024-06-14 DIAGNOSIS — I10 Essential (primary) hypertension: Secondary | ICD-10-CM

## 2024-06-14 DIAGNOSIS — E039 Hypothyroidism, unspecified: Secondary | ICD-10-CM

## 2024-06-14 DIAGNOSIS — I5032 Chronic diastolic (congestive) heart failure: Secondary | ICD-10-CM | POA: Diagnosis not present

## 2024-06-14 DIAGNOSIS — D649 Anemia, unspecified: Secondary | ICD-10-CM

## 2024-06-14 DIAGNOSIS — M545 Low back pain, unspecified: Secondary | ICD-10-CM

## 2024-06-14 DIAGNOSIS — C170 Malignant neoplasm of duodenum: Secondary | ICD-10-CM

## 2024-06-14 MED ORDER — ALBUTEROL SULFATE HFA 108 (90 BASE) MCG/ACT IN AERS
2.0000 | INHALATION_SPRAY | Freq: Four times a day (QID) | RESPIRATORY_TRACT | 6 refills | Status: AC | PRN
  Filled 2024-06-14: qty 18, 25d supply, fill #0
  Filled 2024-07-23: qty 18, 25d supply, fill #1

## 2024-06-14 MED ORDER — TRAMADOL HCL 50 MG PO TABS
100.0000 mg | ORAL_TABLET | Freq: Four times a day (QID) | ORAL | 0 refills | Status: DC | PRN
  Filled 2024-06-14: qty 120, 15d supply, fill #0

## 2024-06-14 NOTE — Progress Notes (Addendum)
 "  Established Patient Office Visit  Subjective   Patient ID: Peggy Armstrong, female    DOB: 10-01-1945  Age: 78 y.o. MRN: [id number]  Chief Complaint  Patient presents with   Medical Management of Chronic Issues    Follow up.    Back pain    Back pain. Does not have any pain meds.     HPI  Discussed the use of AI scribe software for clinical note transcription with the patient, who gave verbal consent to proceed.  History of Present Illness   Peggy Armstrong is a 78 year old female with chronic back pain who presents with severe pain management issues.  She experiences severe back pain that persists despite previous interventions. An x-ray conducted by a surgeon showed no new damage, and no further surgical intervention could be offered. The pain is unbearable and affects her ability to move and perform daily activities. It is particularly intense when lying flat on her back, making it difficult to get out of bed or a car.  She uses tramadol , taking two tablets at a time, which helps her move better. She also uses muscle relaxers in the morning to aid mobility and is cautious about taking Tylenol  due to liver health concerns.  Her legs feel funny, like they are 'sick with sleep,' and her feet become numb, which she attributes to nerve issues in her lower back. She performs exercises in bed as shown by physical therapy to aid mobility.  She has a history of anemia, with her last hemoglobin recorded at 9.8, and continues to take iron  supplements. She also mentions a history of gout, with a recent flare-up managed by using hot water on her hands and legs.  She experiences wheezing and has a history of using albuterol , which she keeps on hand for episodes of pneumonia or difficulty breathing. No shortness of breath reported.      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   Anemia due to blood loss, acute 02/15/2024   IDA (iron  deficiency anemia) 02/15/2024   Anemia 02/14/2024   Chronic anticoagulation 02/12/2024   Acute on chronic anemia 02/12/2024   Severe anemia 02/10/2024   Lumbar radiculopathy 12/15/2023   History of GI bleed 10/21/2023   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   History of depression 10/21/2023   ABLA (acute blood loss anemia) 07/21/2023   Acute GI bleeding 07/21/2023   GI bleed 07/20/2023   Chronic low back pain 07/18/2023   Upper GI bleed 06/20/2023   Reduced mobility 06/20/2023   Dysphagia 05/19/2023   Depressive disorder 05/18/2023   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   AAA (abdominal aortic aneurysm) 05/12/2023   HLD (hyperlipidemia) 05/12/2023   Generalized anxiety disorder 05/12/2023   Atrial fibrillation (HCC) 05/12/2023   Acquired hypothyroidism 05/12/2023   Restless leg 12/02/2022   Chronic diastolic CHF (congestive heart failure) (HCC) 11/08/2019   Morbid obesity (HCC) 11/08/2019   History of DVT (deep vein thrombosis) 11/08/2019   Essential hypertension    Gout    Clotting disorder    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   CKD (chronic kidney disease) stage 3, GFR 30-59 ml/min (HCC) 06/09/2017   CKD (chronic kidney disease), stage III (HCC) 06/09/2017   S/P ERCP 05/05/2017   RA (rheumatoid arthritis) (HCC) 09/07/2015   Past Medical History:  Diagnosis Date   AAA (abdominal aortic aneurysm)  05/12/2023   Acquired hypothyroidism 05/12/2023   Acute on chronic anemia 02/12/2024   AKI (acute kidney injury) 07/21/2023   Anemia due to blood loss, acute 02/15/2024   Atypical atrial flutter (HCC)    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   Cholangitis (HCC)    Choledocholithiasis 03/22/2017   Chronic anticoagulation 02/12/2024   Chronic diastolic CHF (congestive heart failure) (HCC) 11/08/2019   Chronic low back pain 07/18/2023   CKD (chronic kidney disease) stage 3,  GFR 30-59 ml/min (HCC) 06/09/2017   CKD (chronic kidney disease), stage III (HCC) 06/09/2017   Closed compression fracture of body of L1 vertebra (HCC) 02/10/2024   Closed compression fracture of L2 lumbar vertebra, initial encounter (HCC) 05/11/2023   Congestive heart failure (HCC) 05/18/2023   Depressive disorder 05/18/2023   Disorder of bone 10/25/2023   Displacement of bone 10/25/2023   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   DVT (deep venous thrombosis) (HCC)    right DVT   Essential hypertension    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   GAD (generalized anxiety disorder)    Gastric polyps 05/16/2023   Gout    Hemorrhagic shock (HCC) 07/21/2023   History of depression 10/21/2023   History of DVT (deep vein thrombosis) 11/08/2019   History of GI bleed 10/21/2023   Hyperlipidemia 05/12/2023   Hypertension    Hypothyroidism 05/12/2023   IDA (iron  deficiency anemia) 02/15/2024   Iron  deficiency anemia 08/02/2016   Left humeral fracture 10/21/2023   Long term current use of anticoagulant therapy 06/20/2023   Lumbar radiculopathy 12/15/2023   Macrocytic anemia 11/05/2017   Morbid obesity (HCC) 11/08/2019   Mucosal abnormality of esophagus 02/14/2024   Obesity with body mass index 30 or greater 10/25/2023   Paroxysmal atrial fibrillation (HCC) 05/12/2023   Peri-ampullary neoplasm    RA (rheumatoid arthritis) (HCC)    Restless leg 12/02/2022   S/P ERCP 05/05/2017   Upper GI bleed 06/20/2023   Social History[1] Allergies[2]    ROS Per HPI.    Objective:     BP (!) 145/84   Pulse 68   Temp 97.9 F (36.6 C) (Oral)   Resp 16   Ht 5' (1.524 m)   Wt 154 lb 14.4 oz (70.3 kg)   SpO2 97%   BMI 30.25 kg/m  BP Readings from Last 3 Encounters:  06/14/24 (!) 145/84  05/29/24 (!) 151/77  05/26/24 (!) 148/76   Wt Readings from Last 3 Encounters:  06/14/24 154 lb 14.4 oz (70.3 kg)  05/29/24 158 lb (71.7 kg)  05/26/24 158 lb (71.7 kg)   SpO2 Readings from Last  3 Encounters:  06/14/24 97%  05/29/24 95%  05/26/24 97%    Physical Exam Constitutional:      Appearance: Normal appearance. She is not ill-appearing.     Comments: Patient is in quite a bit of back pain today.  HENT:     Head: Normocephalic and atraumatic.     Nose: Nose normal.  Eyes:     General: No scleral icterus.    Conjunctiva/sclera: Conjunctivae normal.  Cardiovascular:     Rate and Rhythm: Normal rate and regular rhythm.     Heart sounds: Normal heart sounds. No murmur heard.    No friction rub.  Pulmonary:     Effort: Pulmonary effort is normal. No respiratory distress.     Breath sounds: Normal breath sounds. No wheezing, rhonchi or rales.  Musculoskeletal:        General: Normal range of motion.  Right lower leg: No edema.     Left lower leg: No edema.  Skin:    General: Skin is warm and dry.     Coloration: Skin is not jaundiced or pale.  Neurological:     General: No focal deficit present.     Mental Status: She is alert.  Psychiatric:        Mood and Affect: Affect is tearful.        Behavior: Behavior normal.      No results found for any visits on 06/14/24.  Last CBC Lab Results  Component Value Date   WBC 4.2 05/26/2024   HGB 9.8 (L) 05/26/2024   HCT 33.4 (L) 05/26/2024   MCV 91.8 05/26/2024   MCH 26.9 05/26/2024   RDW 16.6 (H) 05/26/2024   PLT 96 (L) 05/26/2024   Last metabolic panel Lab Results  Component Value Date   GLUCOSE 101 (H) 05/26/2024   NA 138 05/26/2024   K 3.5 05/26/2024   CL 104 05/26/2024   CO2 25 05/26/2024   BUN 12 05/26/2024   CREATININE 0.83 05/26/2024   GFRNONAA >60 05/26/2024   CALCIUM  8.5 (L) 05/26/2024   PHOS 3.4 02/18/2024   PROT 6.1 (L) 05/17/2024   ALBUMIN 3.3 (L) 05/17/2024   LABGLOB 2.4 07/18/2023   BILITOT 0.6 05/17/2024   ALKPHOS 156 (H) 05/17/2024   AST 41 05/17/2024   ALT 17 05/17/2024   ANIONGAP 9 05/26/2024   Last lipids Lab Results  Component Value Date   CHOL 127 07/18/2023   HDL  25 (L) 07/18/2023   LDLCALC 80 07/18/2023   TRIG 122 07/18/2023   CHOLHDL 5.1 (H) 07/18/2023   Last hemoglobin A1c Lab Results  Component Value Date   HGBA1C 4.3 (L) 07/18/2023      The ASCVD Risk score (Arnett DK, et al., 2019) failed to calculate for the following reasons:   The valid total cholesterol range is 130 to 320 mg/dL   * - Cholesterol units were assumed    Assessment & Plan:    Assessment and Plan    Chronic low back pain Severe chronic low back pain, exacerbated by movement and lying flat. Recent x-ray showed no new damage. Pain management options limited due to upcoming appointment with pain management specialist on January 20th. Tramadol  previously provided some relief. Discussed potential use of fentanyl  patches for better pain control, but currently using tramadol  as a bridge. Discussed risks of serotonin syndrome with tramadol  and Cymbalta  combination. - Prescribed tramadol  100 mg every 6 hours as needed for moderate to severe pain, up to 8 tablets per day. - Advised to mix tramadol  with Tylenol  325 mg for additional pain relief. - Continue muscle relaxers as needed. - Encouraged physical therapy exercises to prevent ulcers and clots since she is staying in bed so often. - Advised to monitor for signs of serotonin syndrome due to tramadol  and Cymbalta  combination.  Duodenal adenocarcinoma Continuing to work with Dr. Ezzard with oncology they are waiting to get a PET scan since the cancer has not shown up on the most recent CT scan.  PET scan pending due to pain management needs. - Will schedule PET scan after pain management consultation. -Bridging her pain management at this time  Anemia Recent hemoglobin level of 9.8. Monitoring required due to potential impact on symptoms such as restless leg syndrome. - Ordered CBC to further assess - Continue iron  supplementation.  Essential hypertension Chronic, not at goal today hypertension likely exacerbated by  pain. Blood pressure to be rechecked today. - We will continue to monitor over time - Continue current regimen.  Acquired hypothyroidism Chronic, stable.  Well-managed on current levothyroxine  regimen. - Continue current levothyroxine  regimen.  Gout Chronic stable. Recent flare managed. No current issues with allopurinol . - Continue allopurinol  regimen.  Heart failure Euvolemic on exam today.  Torsemide  seems to be doing pretty well. - Continue current level of GDMT and fluid pill use  General health maintenance Discussion of flu season and importance of avoiding hospital visits to prevent flu exposure. - Advised to avoid hospital visits during flu season.     I personally spent a total of 33 minutes in the care of the patient today including preparing to see the patient, getting/reviewing separately obtained history, performing a medically appropriate exam/evaluation, counseling and educating, placing orders, and documenting clinical information in the EHR.   Return in about 3 months (around 09/12/2024), or if symptoms worsen or fail to improve, for Chronic Followup.    Lacy Taglieri T Jachob Mcclean, PA-C     [1]  Social History Tobacco Use   Smoking status: Former    Current packs/day: 0.00    Average packs/day: 1.5 packs/day for 44.0 years (66.0 ttl pk-yrs)    Types: Cigarettes    Start date: 5    Quit date: 2012    Years since quitting: 13.9    Passive exposure: Past   Smokeless tobacco: Former   Tobacco comments:    Quit in 2012. Started at 20's. Cannot recall how many.   Vaping Use   Vaping status: Never Used  Substance Use Topics   Alcohol use: No   Drug use: No  [2]  Allergies Allergen Reactions   Codeine Nausea And Vomiting and Other (See Comments)    Made me very sick   Tape Other (See Comments)    Tears skin, as it is VERY THIN!!  - surgical tape   Oxycodone  Other (See Comments)    Hallucinations    "

## 2024-06-14 NOTE — Patient Instructions (Addendum)
 It was nice to see you today!  I will plan to bridge you to your pain management appointment with tramadol - let me know if you have any issues.  If you have any problems before your next visit feel free to message me via MyChart (minor issues or questions) or call the office, otherwise you may reach out to schedule an office visit.  Thank you! Kaheem Halleck, PA-C

## 2024-06-15 LAB — CBC WITH DIFFERENTIAL/PLATELET
Basophils Absolute: 0 x10E3/uL (ref 0.0–0.2)
Basos: 1 %
EOS (ABSOLUTE): 0.2 x10E3/uL (ref 0.0–0.4)
Eos: 3 %
Hematocrit: 36.4 % (ref 34.0–46.6)
Hemoglobin: 10.9 g/dL — ABNORMAL LOW (ref 11.1–15.9)
Immature Grans (Abs): 0.1 x10E3/uL (ref 0.0–0.1)
Immature Granulocytes: 1 %
Lymphocytes Absolute: 0.7 x10E3/uL (ref 0.7–3.1)
Lymphs: 13 %
MCH: 26.9 pg (ref 26.6–33.0)
MCHC: 29.9 g/dL — ABNORMAL LOW (ref 31.5–35.7)
MCV: 90 fL (ref 79–97)
Monocytes Absolute: 0.4 x10E3/uL (ref 0.1–0.9)
Monocytes: 8 %
Neutrophils Absolute: 3.9 x10E3/uL (ref 1.4–7.0)
Neutrophils: 74 %
Platelets: 178 x10E3/uL (ref 150–450)
RBC: 4.05 x10E6/uL (ref 3.77–5.28)
RDW: 14.6 % (ref 11.7–15.4)
WBC: 5.2 x10E3/uL (ref 3.4–10.8)

## 2024-06-17 ENCOUNTER — Ambulatory Visit (HOSPITAL_BASED_OUTPATIENT_CLINIC_OR_DEPARTMENT_OTHER): Payer: Self-pay | Admitting: Student

## 2024-06-19 ENCOUNTER — Other Ambulatory Visit (HOSPITAL_BASED_OUTPATIENT_CLINIC_OR_DEPARTMENT_OTHER): Payer: Self-pay

## 2024-06-24 ENCOUNTER — Other Ambulatory Visit (HOSPITAL_BASED_OUTPATIENT_CLINIC_OR_DEPARTMENT_OTHER): Payer: Self-pay

## 2024-06-24 ENCOUNTER — Other Ambulatory Visit (HOSPITAL_BASED_OUTPATIENT_CLINIC_OR_DEPARTMENT_OTHER): Payer: Self-pay | Admitting: Student

## 2024-06-24 MED ORDER — PANTOPRAZOLE SODIUM 40 MG PO TBEC
40.0000 mg | DELAYED_RELEASE_TABLET | Freq: Two times a day (BID) | ORAL | 3 refills | Status: AC
  Filled 2024-06-24: qty 60, 30d supply, fill #0
  Filled 2024-07-23: qty 60, 30d supply, fill #1

## 2024-06-28 ENCOUNTER — Other Ambulatory Visit (HOSPITAL_BASED_OUTPATIENT_CLINIC_OR_DEPARTMENT_OTHER): Payer: Self-pay

## 2024-06-30 ENCOUNTER — Emergency Department (HOSPITAL_COMMUNITY)
Admission: EM | Admit: 2024-06-30 | Discharge: 2024-07-01 | Disposition: A | Discharge status: Home / Self Care | Attending: Emergency Medicine | Admitting: Emergency Medicine

## 2024-06-30 DIAGNOSIS — M545 Low back pain, unspecified: Secondary | ICD-10-CM | POA: Insufficient documentation

## 2024-06-30 DIAGNOSIS — M549 Dorsalgia, unspecified: Secondary | ICD-10-CM

## 2024-06-30 DIAGNOSIS — Z7901 Long term (current) use of anticoagulants: Secondary | ICD-10-CM | POA: Insufficient documentation

## 2024-06-30 DIAGNOSIS — G8929 Other chronic pain: Secondary | ICD-10-CM | POA: Insufficient documentation

## 2024-06-30 DIAGNOSIS — N390 Urinary tract infection, site not specified: Secondary | ICD-10-CM | POA: Insufficient documentation

## 2024-06-30 DIAGNOSIS — R112 Nausea with vomiting, unspecified: Secondary | ICD-10-CM | POA: Diagnosis present

## 2024-06-30 LAB — URINALYSIS, ROUTINE W REFLEX MICROSCOPIC
Bilirubin Urine: NEGATIVE
Glucose, UA: NEGATIVE mg/dL
Hgb urine dipstick: NEGATIVE
Ketones, ur: NEGATIVE mg/dL
Leukocytes,Ua: NEGATIVE
Nitrite: POSITIVE — AB
Protein, ur: NEGATIVE mg/dL
Specific Gravity, Urine: 1.023 (ref 1.005–1.030)
pH: 6 (ref 5.0–8.0)

## 2024-06-30 MED ORDER — HYDROMORPHONE HCL 1 MG/ML IJ SOLN
1.0000 mg | Freq: Once | INTRAMUSCULAR | Status: AC
  Administered 2024-06-30: 1 mg via INTRAMUSCULAR
  Filled 2024-06-30: qty 1

## 2024-06-30 MED ORDER — DEXAMETHASONE SOD PHOSPHATE PF 10 MG/ML IJ SOLN
10.0000 mg | Freq: Once | INTRAMUSCULAR | Status: AC
  Administered 2024-06-30: 10 mg via INTRAMUSCULAR

## 2024-06-30 MED ORDER — FOSFOMYCIN TROMETHAMINE 3 G PO PACK
3.0000 g | PACK | Freq: Once | ORAL | Status: AC
  Administered 2024-06-30: 3 g via ORAL
  Filled 2024-06-30: qty 3

## 2024-06-30 MED ORDER — SALONPAS 3.1-6-10 % EX PTCH
1.0000 | MEDICATED_PATCH | Freq: Two times a day (BID) | CUTANEOUS | 0 refills | Status: AC
  Filled 2024-06-30 – 2024-07-02 (×3): qty 20, 5d supply, fill #0

## 2024-06-30 MED ORDER — DICLOFENAC EPOLAMINE 1.3 % EX PTCH
1.0000 | MEDICATED_PATCH | Freq: Two times a day (BID) | CUTANEOUS | Status: DC
  Administered 2024-06-30: 1 via TRANSDERMAL
  Filled 2024-06-30: qty 1

## 2024-06-30 MED ORDER — PREDNISONE 20 MG PO TABS
40.0000 mg | ORAL_TABLET | Freq: Every day | ORAL | 0 refills | Status: DC
  Filled 2024-06-30: qty 8, 4d supply, fill #0

## 2024-06-30 NOTE — Discharge Instructions (Signed)
 Days evaluation has been reassuring. You have been prescribed a course of steroids and pain medication patches which should assist with good pain control.  Please call your primary care clinician tomorrow to ensure appropriate ongoing outpatient care.

## 2024-06-30 NOTE — ED Triage Notes (Signed)
 Pt arrives via [facility] from home c/o chronic back pain. Pt denies new injury. Pt had recent back surgery.

## 2024-06-30 NOTE — ED Provider Notes (Signed)
 " [place] EMERGENCY DEPARTMENT AT [facility] Provider Note   CSN: [id number] Arrival date & time: 06/30/24  1839     Patient presents with: Back Pain   Peggy Armstrong is a 79 y.o. female.   HPI Patient presents with back pain, diffuse, lower, without radiculopathy, fever, nausea, vomiting. Patient has a history of chronic back pain, has been seen, evaluated multiple times over the past 2 months, is scheduled to follow-up with pain management clinic in 2 weeks.  No fall, trauma.  She last saw her physician assistant in [place] last week.     Prior to Admission medications  Medication Sig Start Date End Date Taking? Authorizing Provider  Camphor-Menthol -Methyl Sal (SALONPAS ) 3.07-02-08 % [facility] Apply 1 patch topically in the morning and at bedtime for 5 days. 06/30/24 07/05/24 Yes Garrick Charleston, MD  predniSONE  (DELTASONE ) 20 MG tablet Take 2 tablets (40 mg total) by mouth daily with breakfast. For the next four days 06/30/24  Yes Garrick Charleston, MD  acetaminophen  (TYLENOL ) 500 MG tablet Take 2 tablets (1,000 mg total) by mouth every 8 (eight) hours. Patient taking differently: Take 1,000 mg by mouth 2 (two) times daily as needed for fever or headache (pain). 02/23/24   Krishnan, Gokul, MD  albuterol  (VENTOLIN  HFA) 108 (90 Base) MCG/ACT inhaler Inhale 2 puffs into the lungs every 6 (six) hours as needed for wheezing or shortness of breath. 06/14/24   Rothfuss, Jacob T, PA-C  allopurinol  (ZYLOPRIM ) 300 MG tablet Take 1 tablet (300 mg total) by mouth daily. 07/18/23   Rothfuss, Jacob T, PA-C  amiodarone  (PACERONE ) 200 MG tablet Take 1 tablet (200 mg total) by mouth 2 (two) times daily. 09/01/23   Croitoru, Jerel, MD  apixaban  (ELIQUIS ) 5 MG TABS tablet Take 1 tablet (5 mg total) by mouth 2 (two) times daily for AFIB 12/25/23     Ascorbic Acid (VITAMIN C PO) Take 1 tablet by mouth daily.    [provider]  atorvastatin  (LIPITOR) 20 MG tablet Take 1 tablet (20 mg total) by  mouth daily. 08/11/23     busPIRone  (BUSPAR ) 10 MG tablet Take 10 mg by mouth 3 (three) times daily as needed (anxiety).    [provider]  CALCIUM  PO Take 1 tablet by mouth daily.    [provider]  Cholecalciferol  (VITAMIN D3 PO) Take 1 capsule by mouth daily.    [provider]  Cyanocobalamin  (VITAMIN B-12 PO) Take 1 tablet by mouth daily.    [provider]  diltiazem  (CARDIZEM  CD) 120 MG 24 hr capsule Take 1 capsule (120 mg total) by mouth daily. Patient taking differently: Take 120 mg by mouth at bedtime. 04/05/24 07/04/24  Vernon Ranks, MD  DULoxetine  (CYMBALTA ) 20 MG capsule Take 1 capsule (20 mg total) by mouth daily. 01/26/24     Ferrous Sulfate  (IRON  PO) Take 1 tablet by mouth daily.    [provider]  levothyroxine  (SYNTHROID ) 100 MCG tablet Take 1 tablet (100 mcg total) by mouth daily before breakfast. 05/27/24   Rothfuss, Jacob T, PA-C  melatonin 3 MG TABS tablet Take 1 tablet (3 mg total) by mouth at bedtime as needed (insomnia). Patient taking differently: Take 3 mg by mouth at bedtime. 05/18/23   Krishnan, Gokul, MD  MELATONIN PO Take 1 tablet by mouth at bedtime.    [provider]  methocarbamol  (ROBAXIN ) 500 MG tablet Take 1 tablet (500 mg total) by mouth every 6 (six) hours as needed for muscle spasms.  06/06/24   Rothfuss, Jacob T, PA-C  pantoprazole  (PROTONIX ) 40 MG tablet Take 1 tablet (40 mg total) by mouth 2 (two) times daily. 06/24/24   Rothfuss, Jacob T, PA-C  Potassium Chloride  ER 20 MEQ TBCR Take 1 tablet (20 mEq total) by mouth daily. 04/23/24   Barbarann Nest, MD  torsemide  (DEMADEX ) 20 MG tablet Take torsemide  on alternating days 40 mg one day (2 tablets), then the next day 20 mg (1 tablet) Patient taking differently: Take 20 mg by mouth daily. 09/21/23   Madireddy, Alean SAUNDERS, MD  traMADol  (ULTRAM ) 50 MG tablet Take 2 tablets (100 mg total) by mouth every 6 (six) hours as needed for moderate pain (pain score 4-6)  or severe pain (pain score 7-10). 06/14/24   Rothfuss, Jacob T, PA-C    Allergies: Codeine, Tape, and Oxycodone     Review of Systems  Updated Vital Signs BP (!) 188/110 (BP Location: Left Arm)   Pulse 73   Temp 97.7 F (36.5 C) (Oral)   Resp 14   SpO2 98%   Physical Exam Vitals and nursing note reviewed.  Constitutional:      General: She is not in acute distress.    Appearance: She is well-developed.  HENT:     Head: Normocephalic and atraumatic.  Eyes:     Conjunctiva/sclera: Conjunctivae normal.  Cardiovascular:     Rate and Rhythm: Normal rate and regular rhythm.  Pulmonary:     Effort: Pulmonary effort is normal. No respiratory distress.     Breath sounds: Normal breath sounds. No stridor.  Abdominal:     General: There is no distension.  Skin:    General: Skin is warm and dry.  Neurological:     General: No focal deficit present.     Mental Status: She is alert and oriented to person, place, and time.     Cranial Nerves: No cranial nerve deficit.     Motor: Atrophy present.  Psychiatric:        Mood and Affect: Mood normal.     (all labs ordered are listed, but only abnormal results are displayed) Labs Reviewed  URINALYSIS, ROUTINE W REFLEX MICROSCOPIC - Abnormal; Notable for the following components:      Result Value   APPearance HAZY (*)    Nitrite POSITIVE (*)    Bacteria, UA RARE (*)    All other components within normal limits    EKG: None  Radiology: No results found.   Procedures   Medications Ordered in the ED  diclofenac  (FLECTOR ) 1.3 % 1 patch (1 patch Transdermal Patch Applied 06/30/24 2129)  fosfomycin  (MONUROL ) packet 3 g (has no administration in time range)  dexamethasone  (DECADRON ) injection 10 mg (10 mg Intramuscular Given 06/30/24 1929)  HYDROmorphone  (DILAUDID ) injection 1 mg (1 mg Intramuscular Given 06/30/24 1928)                                    Medical Decision Making Elderly female presents with back pain.  She is  awake, alert, distally neurovascularly intact, has no red flags suggesting CNS dysfunction. Given her back pain, and her personal suspicion for urinary tract infection, urinalysis was performed, there is no evidence for pyelonephritis, with no fever, tachycardia, or distress. Urinalysis positive for infection, patient received antibiotics here, and she improved markedly with medications including diclofenac , Decadron , Dilaudid .  Patient will follow-up with primary care and as scheduled with pain management  later this month.  Amount and/or Complexity of Data Reviewed External Data Reviewed: notes.    Details: 6 ED visits in the past 6 months Labs: ordered. Decision-making details documented in ED Course.  Risk Prescription drug management. Decision regarding hospitalization. Diagnosis or treatment significantly limited by social determinants of health.   9:43 PM Patient markedly improved     Final diagnoses:  Acute on chronic back pain  Lower urinary tract infectious disease    ED Discharge Orders          Ordered    predniSONE  (DELTASONE ) 20 MG tablet  Daily with breakfast        06/30/24 2143    Camphor-Menthol -Methyl Sal (SALONPAS ) 3.07-02-08 % [facility]  2 times daily        06/30/24 2143               Garrick Charleston, MD 06/30/24 2143  "

## 2024-07-01 ENCOUNTER — Other Ambulatory Visit (HOSPITAL_BASED_OUTPATIENT_CLINIC_OR_DEPARTMENT_OTHER): Payer: Self-pay

## 2024-07-01 DIAGNOSIS — G8929 Other chronic pain: Secondary | ICD-10-CM

## 2024-07-01 MED ORDER — METHOCARBAMOL 500 MG PO TABS
500.0000 mg | ORAL_TABLET | Freq: Four times a day (QID) | ORAL | 0 refills | Status: AC | PRN
  Filled 2024-07-01: qty 90, 23d supply, fill #0

## 2024-07-02 ENCOUNTER — Other Ambulatory Visit (HOSPITAL_BASED_OUTPATIENT_CLINIC_OR_DEPARTMENT_OTHER): Payer: Self-pay

## 2024-07-03 DIAGNOSIS — I130 Hypertensive heart and chronic kidney disease with heart failure and stage 1 through stage 4 chronic kidney disease, or unspecified chronic kidney disease: Secondary | ICD-10-CM | POA: Insufficient documentation

## 2024-07-03 DIAGNOSIS — Z7901 Long term (current) use of anticoagulants: Secondary | ICD-10-CM | POA: Diagnosis not present

## 2024-07-03 DIAGNOSIS — I5030 Unspecified diastolic (congestive) heart failure: Secondary | ICD-10-CM | POA: Insufficient documentation

## 2024-07-03 DIAGNOSIS — I480 Paroxysmal atrial fibrillation: Secondary | ICD-10-CM | POA: Insufficient documentation

## 2024-07-03 DIAGNOSIS — N189 Chronic kidney disease, unspecified: Secondary | ICD-10-CM | POA: Diagnosis not present

## 2024-07-03 DIAGNOSIS — M545 Low back pain, unspecified: Secondary | ICD-10-CM | POA: Diagnosis not present

## 2024-07-03 DIAGNOSIS — M549 Dorsalgia, unspecified: Secondary | ICD-10-CM | POA: Diagnosis present

## 2024-07-04 ENCOUNTER — Other Ambulatory Visit: Payer: Self-pay

## 2024-07-04 ENCOUNTER — Other Ambulatory Visit (HOSPITAL_BASED_OUTPATIENT_CLINIC_OR_DEPARTMENT_OTHER): Payer: Self-pay

## 2024-07-04 ENCOUNTER — Emergency Department (HOSPITAL_COMMUNITY)
Admission: EM | Admit: 2024-07-04 | Discharge: 2024-07-04 | Disposition: A | Discharge status: Home / Self Care | Attending: Emergency Medicine | Admitting: Emergency Medicine

## 2024-07-04 ENCOUNTER — Emergency Department (HOSPITAL_COMMUNITY)

## 2024-07-04 DIAGNOSIS — M545 Low back pain, unspecified: Secondary | ICD-10-CM

## 2024-07-04 DIAGNOSIS — Z7901 Long term (current) use of anticoagulants: Secondary | ICD-10-CM

## 2024-07-04 MED ORDER — HYDROCODONE-ACETAMINOPHEN 5-325 MG PO TABS
1.0000 | ORAL_TABLET | Freq: Once | ORAL | Status: AC
  Administered 2024-07-04: 1 via ORAL
  Filled 2024-07-04: qty 1

## 2024-07-04 MED ORDER — HYDROCODONE-ACETAMINOPHEN 5-325 MG PO TABS
1.0000 | ORAL_TABLET | ORAL | 0 refills | Status: DC | PRN
  Filled 2024-07-04: qty 20, 2d supply, fill #0

## 2024-07-04 NOTE — Discharge Instructions (Addendum)
 Please work with your primary care provider and with your pain management clinic for management of your back pain.  In addition to medication, you can may apply ice to the areas that are painful.  Ice should be applied for 30 minutes at a time, 4 times a day.

## 2024-07-04 NOTE — ED Triage Notes (Signed)
 Patient BIB EMS from home c/o back pain x 1 week. Patient report she's taking her PRN medication with no relief. Fentanyl  50 mcg given by EMS. Patient denies any recent injury or fall.   BP 172/98 HR 80 RR 20 O2sat 97 %On RA

## 2024-07-04 NOTE — ED Provider Notes (Signed)
 " [place] EMERGENCY DEPARTMENT AT [facility] Provider Note   CSN: [id number] Arrival date & time: 07/03/24  2356     Patient presents with: Back Pain   Peggy Armstrong is a 79 y.o. female.   The history is provided by the patient.  Back Pain  She has history of hypertension, hyperlipidemia, diastolic heart failure, paroxysmal atrial fibrillation anticoagulated on apixaban , chronic kidney disease, chronic back pain, spinal compression fracture and comes in because of increased pain over the last 2 days.  Pain is primarily on the left side but without radiation.  She has been taking acetaminophen  and methocarbamol  without relief.  She denies fever or chills and denies any weakness or tingling.  She denies any trauma.    Prior to Admission medications  Medication Sig Start Date End Date Taking? Authorizing Provider  acetaminophen  (TYLENOL ) 500 MG tablet Take 2 tablets (1,000 mg total) by mouth every 8 (eight) hours. Patient taking differently: Take 1,000 mg by mouth 2 (two) times daily as needed for fever or headache (pain). 02/23/24   Krishnan, Gokul, MD  albuterol  (VENTOLIN  HFA) 108 (90 Base) MCG/ACT inhaler Inhale 2 puffs into the lungs every 6 (six) hours as needed for wheezing or shortness of breath. 06/14/24   Rothfuss, Jacob T, PA-C  allopurinol  (ZYLOPRIM ) 300 MG tablet Take 1 tablet (300 mg total) by mouth daily. 07/18/23   Rothfuss, Jacob T, PA-C  amiodarone  (PACERONE ) 200 MG tablet Take 1 tablet (200 mg total) by mouth 2 (two) times daily. 09/01/23   Croitoru, Mihai, MD  apixaban  (ELIQUIS ) 5 MG TABS tablet Take 1 tablet (5 mg total) by mouth 2 (two) times daily for AFIB 12/25/23     Ascorbic Acid (VITAMIN C PO) Take 1 tablet by mouth daily.    [provider]  atorvastatin  (LIPITOR) 20 MG tablet Take 1 tablet (20 mg total) by mouth daily. 08/11/23     busPIRone  (BUSPAR ) 10 MG tablet Take 10 mg by mouth 3 (three) times daily as needed (anxiety).    [provider]  CALCIUM  PO Take 1 tablet by mouth daily.    [provider]  Camphor-Menthol -Methyl Sal (SALONPAS ) 3.07-02-08 % PTCH Apply 1 patch topically in the morning and at bedtime for 5 days. 06/30/24 07/05/24  Garrick Charleston, MD  Cholecalciferol  (VITAMIN D3 PO) Take 1 capsule by mouth daily.    [provider]  Cyanocobalamin  (VITAMIN B-12 PO) Take 1 tablet by mouth daily.    [provider]  diltiazem  (CARDIZEM  CD) 120 MG 24 hr capsule Take 1 capsule (120 mg total) by mouth daily. Patient taking differently: Take 120 mg by mouth at bedtime. 04/05/24 07/04/24  Vernon Ranks, MD  DULoxetine  (CYMBALTA ) 20 MG capsule Take 1 capsule (20 mg total) by mouth daily. 01/26/24     Ferrous Sulfate  (IRON  PO) Take 1 tablet by mouth daily.    [provider]  levothyroxine  (SYNTHROID ) 100 MCG tablet Take 1 tablet (100 mcg total) by mouth daily before breakfast. 05/27/24   Rothfuss, Jacob T, PA-C  melatonin 3 MG TABS tablet Take 1 tablet (3 mg total) by mouth at bedtime as needed (insomnia). Patient taking differently: Take 3 mg by mouth at bedtime. 05/18/23   Krishnan, Gokul, MD  MELATONIN PO Take 1 tablet by mouth at bedtime.    [provider]  methocarbamol  (ROBAXIN ) 500 MG tablet Take 1 tablet (500 mg total) by mouth every 6 (six) hours as needed for muscle spasms. 07/01/24  Rothfuss, Jacob T, PA-C  pantoprazole  (PROTONIX ) 40 MG tablet Take 1 tablet (40 mg total) by mouth 2 (two) times daily. 06/24/24   Rothfuss, Jacob T, PA-C  Potassium Chloride  ER 20 MEQ TBCR Take 1 tablet (20 mEq total) by mouth daily. 04/23/24   Barbarann Nest, MD  predniSONE  (DELTASONE ) 20 MG tablet Take 2 tablets (40 mg total) by mouth daily with breakfast. For the next four days 06/30/24   Garrick Charleston, MD  torsemide  (DEMADEX ) 20 MG tablet Take torsemide  on alternating days 40 mg one day (2 tablets), then the next day 20 mg (1 tablet) Patient taking differently: Take 20 mg by mouth daily.  09/21/23   Madireddy, Alean SAUNDERS, MD  traMADol  (ULTRAM ) 50 MG tablet Take 2 tablets (100 mg total) by mouth every 6 (six) hours as needed for moderate pain (pain score 4-6) or severe pain (pain score 7-10). 06/14/24   Rothfuss, [name] DASEN, PA-C    Allergies: Codeine, Tape, and Oxycodone     Review of Systems  Musculoskeletal:  Positive for back pain.  All other systems reviewed and are negative.   Updated Vital Signs BP (!) 176/107 (BP Location: Right Arm)   Pulse 88   Temp 98.4 F (36.9 C) (Oral)   Resp 16   SpO2 98%   Physical Exam Vitals and nursing note reviewed.   79 year old female, resting comfortably and in no acute distress. Vital signs are significant for elevated blood pressure. Oxygen saturation is 98%, which is normal. Head is normocephalic and atraumatic. PERRLA, EOMI.  Back is tender in the mid and lower lumbar area and also in the left costovertebral angle. Lungs are clear without rales, wheezes, or rhonchi. Chest is nontender. Heart has regular rate and rhythm without murmur. Abdomen is soft, flat, nontender Extremities have no cyanosis or edema. Skin is warm and dry without rash. Neurologic: Awake and alert, moves all extremities equally.   Radiology: CT Renal Stone Study Result Date: 07/04/2024 EXAM: CT ABDOMEN AND PELVIS WITHOUT CONTRAST 07/04/2024 02:07:35 AM TECHNIQUE: CT of the abdomen and pelvis was performed without the administration of intravenous contrast. Multiplanar reformatted images are provided for review. Automated exposure control, iterative reconstruction, and/or weight-based adjustment of the mA/kV was utilized to reduce the radiation dose to as low as reasonably achievable. COMPARISON: CT abdomen and pelvis with IV contrast 04/01/2024, CTA chest abdomen and pelvis 03/12/2024. CLINICAL HISTORY: Abdominal/flank pain, stone suspected. FINDINGS: LOWER CHEST: There is mild cardiomegaly. There is hypodensity in the cardiac blood pool consistent with  anemia. Small layering right pleural effusion is unchanged. There is posterior atelectasis in the lower lobes. Calcification in the coronary arteries. There is a moderate-sized hiatal hernia with a small amount of fluid in the hernia. LIVER: Noncontrast attenuation of the liver is 87 Hounsfield units which may be seen with amiodarone  or heavy metal toxicity. No focal liver abnormality is seen without contrast. GALLBLADDER AND BILE DUCTS: No new biliary abnormality is seen with chronic post cholecystectomy pneumobilia and common bile duct prominence measuring 10 mm. SPLEEN: The last CT demonstrated numerous subcentimeter hypodensities in the spleen. If these are still present, they are not visible without contrast. PANCREAS: The pancreas is partially atrophic, otherwise unremarkable without contrast. ADRENAL GLANDS: No acute abnormality. KIDNEYS, URETERS AND BLADDER: No stones in the kidneys or ureters. No hydronephrosis. No perinephric or periureteral stranding. There is no renal mass. There are a few Bosniak 1 and 2 renal cysts but no suspicious contour deforming abnormality of the unenhanced kidneys.  Per consensus, no follow-up is needed for simple Bosniak type 1 and 2 renal cysts, unless the patient has a malignancy history or risk factors. Urinary bladder is unremarkable. GI AND BOWEL: Stomach demonstrates no acute abnormality. There is no bowel obstruction or inflammation. The appendix is not seen. Moderate fecal retention with dense stool. There is sigmoid diverticulosis without evidence of diverticulitis. PERITONEUM AND RETROPERITONEUM: Trace pelvic ascites. No free air. Interval increased body wall edema and mild mesenteric congestive changes. No free hemorrhage, focal fluid collections or localizing inflammatory processes are identified. VASCULATURE: Aorta is normal in caliber. There is moderate to heavy aortoiliac and branch vessel atherosclerosis without aneurysm. LYMPH NODES: No lymphadenopathy.  REPRODUCTIVE ORGANS: The uterus is absent. No adnexal mass is seen. BONES AND SOFT TISSUES: There is osteopenia. Chronic compression fractures with retropulsion of L1 and L2 with interval kyphoplasty of L1. Degenerative change lumbar spine. No new osseous abnormality. No focal soft tissue abnormality. IMPRESSION: 1. No acute CT findings in the abdomen or pelvis. 2. Interval increased body wall edema and mild mesenteric congestive changes with trace pelvic ascites. 3. Moderate fecal retention with dense stool. No bowel obstruction or inflammation. 4. Moderate-sized hiatal hernia with a small amount of fluid in the hernia. 5. Sigmoid diverticulosis without evidence of diverticulitis. 6. Moderate to heavy aortoiliac and branch vessel atherosclerosis without aneurysm. 7. Increased liver attenuation, which may be seen with amiodarone  therapy or heavy metal deposition. 8. Cardiomegaly with decreased density of the cardiac blood pool consistent with anemia. 9. Unchanged small right pleural effusion. Electronically signed by: Francis Quam MD 07/04/2024 02:36 AM EST RP Workstation: [id number]     Procedures   Medications Ordered in the ED  HYDROcodone -acetaminophen  (NORCO/VICODIN) 5-325 MG per tablet 1 tablet (1 tablet Oral Given 07/04/24 0159)                                    Medical Decision Making Amount and/or Complexity of Data Reviewed Radiology: ordered.  Risk Prescription drug management.   Low back and left flank pain which seems most likely to be exacerbation of chronic back pain.  I have ordered hydrocodone -acetaminophen  for pain, and I have ordered CT scan to evaluate for possible new spinal compression fracture and also to look for possible urolithiasis.  CT scan shows no acute findings in abdomen or pelvis.  No evidence of urolithiasis, no change in her compression fractures except for interval kyphoplasty of L1.  I have independently viewed the images, and agree with the radiologist's  interpretation.  Patient had good relief from hydrocodone -acetaminophen .  I am discharging her with a prescription for same.     Final diagnoses:  Acute on chronic low back pain  Anticoagulated on apixaban     ED Discharge Orders          Ordered    HYDROcodone -acetaminophen  (NORCO/VICODIN) 5-325 MG tablet  Every 4 hours PRN        07/04/24 0312               Raford Lenis, MD 07/04/24 [phone]  "

## 2024-07-08 ENCOUNTER — Other Ambulatory Visit (HOSPITAL_BASED_OUTPATIENT_CLINIC_OR_DEPARTMENT_OTHER): Payer: Self-pay

## 2024-07-08 MED ORDER — TRAMADOL HCL 50 MG PO TABS
50.0000 mg | ORAL_TABLET | Freq: Four times a day (QID) | ORAL | 0 refills | Status: DC | PRN
  Filled 2024-07-08: qty 20, 5d supply, fill #0

## 2024-07-09 ENCOUNTER — Encounter (HOSPITAL_COMMUNITY): Payer: Self-pay

## 2024-07-09 ENCOUNTER — Emergency Department (HOSPITAL_COMMUNITY)
Admission: EM | Admit: 2024-07-09 | Discharge: 2024-07-10 | Disposition: A | Discharge status: Home / Self Care | Attending: Emergency Medicine | Admitting: Emergency Medicine

## 2024-07-09 ENCOUNTER — Other Ambulatory Visit: Payer: Self-pay

## 2024-07-09 ENCOUNTER — Other Ambulatory Visit (HOSPITAL_BASED_OUTPATIENT_CLINIC_OR_DEPARTMENT_OTHER): Payer: Self-pay

## 2024-07-09 ENCOUNTER — Emergency Department (HOSPITAL_COMMUNITY)

## 2024-07-09 DIAGNOSIS — N189 Chronic kidney disease, unspecified: Secondary | ICD-10-CM | POA: Insufficient documentation

## 2024-07-09 DIAGNOSIS — R7989 Other specified abnormal findings of blood chemistry: Secondary | ICD-10-CM | POA: Insufficient documentation

## 2024-07-09 DIAGNOSIS — M546 Pain in thoracic spine: Secondary | ICD-10-CM | POA: Diagnosis not present

## 2024-07-09 DIAGNOSIS — I130 Hypertensive heart and chronic kidney disease with heart failure and stage 1 through stage 4 chronic kidney disease, or unspecified chronic kidney disease: Secondary | ICD-10-CM | POA: Diagnosis not present

## 2024-07-09 DIAGNOSIS — I480 Paroxysmal atrial fibrillation: Secondary | ICD-10-CM | POA: Insufficient documentation

## 2024-07-09 DIAGNOSIS — G8929 Other chronic pain: Secondary | ICD-10-CM | POA: Diagnosis not present

## 2024-07-09 DIAGNOSIS — M549 Dorsalgia, unspecified: Secondary | ICD-10-CM | POA: Diagnosis present

## 2024-07-09 DIAGNOSIS — I509 Heart failure, unspecified: Secondary | ICD-10-CM | POA: Diagnosis not present

## 2024-07-09 DIAGNOSIS — R0602 Shortness of breath: Secondary | ICD-10-CM | POA: Diagnosis not present

## 2024-07-09 DIAGNOSIS — Z7901 Long term (current) use of anticoagulants: Secondary | ICD-10-CM | POA: Diagnosis not present

## 2024-07-09 LAB — CBC
HCT: 40.6 % (ref 36.0–46.0)
Hemoglobin: 11.8 g/dL — ABNORMAL LOW (ref 12.0–15.0)
MCH: 27.6 pg (ref 26.0–34.0)
MCHC: 29.1 g/dL — ABNORMAL LOW (ref 30.0–36.0)
MCV: 94.9 fL (ref 80.0–100.0)
Platelets: 106 K/uL — ABNORMAL LOW (ref 150–400)
RBC: 4.28 MIL/uL (ref 3.87–5.11)
RDW: 21 % — ABNORMAL HIGH (ref 11.5–15.5)
WBC: 6.4 K/uL (ref 4.0–10.5)
nRBC: 0 % (ref 0.0–0.2)

## 2024-07-09 LAB — BASIC METABOLIC PANEL WITH GFR
Anion gap: 11 (ref 5–15)
BUN: 15 mg/dL (ref 8–23)
CO2: 23 mmol/L (ref 22–32)
Calcium: 9.1 mg/dL (ref 8.9–10.3)
Chloride: 103 mmol/L (ref 98–111)
Creatinine, Ser: 0.9 mg/dL (ref 0.44–1.00)
GFR, Estimated: >60 mL/min
Glucose, Bld: 96 mg/dL (ref 70–99)
Potassium: 3.9 mmol/L (ref 3.5–5.1)
Sodium: 137 mmol/L (ref 135–145)

## 2024-07-09 LAB — RESP PANEL BY RT-PCR (RSV, FLU A&B, COVID)  RVPGX2
Influenza A by PCR: NEGATIVE
Influenza B by PCR: NEGATIVE
Resp Syncytial Virus by PCR: NEGATIVE
SARS Coronavirus 2 by RT PCR: NEGATIVE

## 2024-07-09 LAB — TROPONIN T, HIGH SENSITIVITY: Troponin T High Sensitivity: 29 ng/L — ABNORMAL HIGH (ref 0–19)

## 2024-07-09 LAB — PRO BRAIN NATRIURETIC PEPTIDE: Pro Brain Natriuretic Peptide: >35000 pg/mL — ABNORMAL HIGH

## 2024-07-09 NOTE — ED Notes (Signed)
 Pt later complains of SOB x months with worsening tonight. Pt states that is worse with lying down. Denies CP

## 2024-07-09 NOTE — ED Provider Triage Note (Signed)
 Emergency Medicine Provider Triage Evaluation Note  Peggy Armstrong , a 79 y.o. female  was evaluated in triage.  Pt complains of shortness of breath and back pain.  She states she has been short of breath for several months but worse in the last week.  Lying flat makes it worse.  Denies chest pain.  Also reports lower back pain that she has chronically.  Has been seen 4 times for her back pain in the last week.  Review of Systems  Positive: See above Negative: See above  Physical Exam  BP (!) 161/97   Pulse 74   Temp 98.1 F (36.7 C) (Oral)   Resp 16   SpO2 100%  Gen:   Awake, no distress   Resp:  Normal effort  MSK:   Moves extremities without difficulty  Other:    Medical Decision Making  Medically screening exam initiated at 8:28 PM.  Appropriate orders placed.  Peggy Armstrong was informed that the remainder of the evaluation will be completed by another provider, this initial triage assessment does not replace that evaluation, and the importance of remaining in the ED until their evaluation is complete.  Work up started   Peggy Norleen POUR, PA-C 07/09/24 2029

## 2024-07-09 NOTE — ED Notes (Signed)
 Notified doctor and RN of pt increased SHOB, Vitals were updated.

## 2024-07-09 NOTE — ED Triage Notes (Signed)
 Pt BIB [facility] for chronic back pain. Pt has been seen at [facility] and here 4 times in last week. Pt is prescribed Tramadol  and a muscle relaxer and has an appt with pain clinic next week. Denies new injury

## 2024-07-10 LAB — TROPONIN T, HIGH SENSITIVITY: Troponin T High Sensitivity: 33 ng/L — ABNORMAL HIGH (ref 0–19)

## 2024-07-10 MED ORDER — FUROSEMIDE 10 MG/ML IJ SOLN
40.0000 mg | Freq: Once | INTRAMUSCULAR | Status: AC
  Administered 2024-07-10: 40 mg via INTRAVENOUS
  Filled 2024-07-10: qty 4

## 2024-07-10 MED ORDER — HYDROCODONE-ACETAMINOPHEN 5-325 MG PO TABS
1.0000 | ORAL_TABLET | Freq: Once | ORAL | Status: AC
  Administered 2024-07-10: 1 via ORAL
  Filled 2024-07-10: qty 1

## 2024-07-10 NOTE — ED Notes (Signed)
 Pt daughter called, no answer, message left. Pt cites that her daughter is working form home all day and will be home when she gets there.

## 2024-07-10 NOTE — ED Notes (Signed)
 Pt daughter called back and is awaiting pt arrival at home

## 2024-07-10 NOTE — ED Provider Notes (Signed)
 " [place] EMERGENCY DEPARTMENT AT [facility] Provider Note   CSN: [id number] Arrival date & time: 07/09/24  8042     Patient presents with: Back Pain   Peggy Armstrong is a 79 y.o. female.   The history is provided by the patient.  Back Pain  She has history of hypertension, paroxysmal atrial fibrillation anticoagulated on apixaban , chronic kidney disease, heart failure, chronic back pain comes in because of shortness of breath which started today.  Pain is worse when she lays flat but also seems to be worsened by her pain  She had been taking hydrocodone -acetaminophen  which had been giving her relief, her neurosurgeon ordered tramadol  today.  She denies chest pain, heaviness, tightness, pressure.  Of note, she is scheduled to be seen at a pain management clinic on 07/16/2024.    Prior to Admission medications  Medication Sig Start Date End Date Taking? Authorizing Provider  acetaminophen  (TYLENOL ) 500 MG tablet Take 2 tablets (1,000 mg total) by mouth every 8 (eight) hours. Patient taking differently: Take 1,000 mg by mouth 2 (two) times daily as needed for fever or headache (pain). 02/23/24   Krishnan, Gokul, MD  albuterol  (VENTOLIN  HFA) 108 [phone] Base) MCG/ACT inhaler Inhale 2 puffs into the lungs every 6 (six) hours as needed for wheezing or shortness of breath. 06/14/24   Rothfuss, Jacob T, PA-C  allopurinol  (ZYLOPRIM ) 300 MG tablet Take 1 tablet (300 mg total) by mouth daily. 07/18/23   Rothfuss, Jacob T, PA-C  amiodarone  (PACERONE ) 200 MG tablet Take 1 tablet (200 mg total) by mouth 2 (two) times daily. 09/01/23   Croitoru, Jerel, MD  apixaban  (ELIQUIS ) 5 MG TABS tablet Take 1 tablet (5 mg total) by mouth 2 (two) times daily for AFIB 12/25/23     Ascorbic Acid (VITAMIN C PO) Take 1 tablet by mouth daily.    [provider]  atorvastatin  (LIPITOR) 20 MG tablet Take 1 tablet (20 mg total) by mouth daily. 08/11/23     busPIRone  (BUSPAR ) 10 MG tablet Take 10 mg by  mouth 3 (three) times daily as needed (anxiety).    [provider]  CALCIUM  PO Take 1 tablet by mouth daily.    [provider]  Cholecalciferol  (VITAMIN D3 PO) Take 1 capsule by mouth daily.    [provider]  Cyanocobalamin  (VITAMIN B-12 PO) Take 1 tablet by mouth daily.    [provider]  diltiazem  (CARDIZEM  CD) 120 MG 24 hr capsule Take 1 capsule (120 mg total) by mouth daily. Patient taking differently: Take 120 mg by mouth at bedtime. 04/05/24 07/04/24  Vernon Ranks, MD  DULoxetine  (CYMBALTA ) 20 MG capsule Take 1 capsule (20 mg total) by mouth daily. 01/26/24     Ferrous Sulfate  (IRON  PO) Take 1 tablet by mouth daily.    [provider]  HYDROcodone -acetaminophen  (NORCO/VICODIN) 5-325 MG tablet Take 1-2 tablets by mouth every 4 (four) hours as needed. 07/04/24   Raford Lenis, MD  levothyroxine  (SYNTHROID ) 100 MCG tablet Take 1 tablet (100 mcg total) by mouth daily before breakfast. 05/27/24   Rothfuss, Jacob T, PA-C  melatonin 3 MG TABS tablet Take 1 tablet (3 mg total) by mouth at bedtime as needed (insomnia). Patient taking differently: Take 3 mg by mouth at bedtime. 05/18/23   Krishnan, Gokul, MD  MELATONIN PO Take 1 tablet by mouth at bedtime.    [provider]  methocarbamol  (ROBAXIN ) 500 MG tablet Take 1 tablet (500 mg total) by mouth every  6 (six) hours as needed for muscle spasms. 07/01/24   Rothfuss, Jacob T, PA-C  pantoprazole  (PROTONIX ) 40 MG tablet Take 1 tablet (40 mg total) by mouth 2 (two) times daily. 06/24/24   Rothfuss, Jacob T, PA-C  Potassium Chloride  ER 20 MEQ TBCR Take 1 tablet (20 mEq total) by mouth daily. 04/23/24   Barbarann Nest, MD  predniSONE  (DELTASONE ) 20 MG tablet Take 2 tablets (40 mg total) by mouth daily with breakfast. For the next four days 06/30/24   Garrick Charleston, MD  torsemide  (DEMADEX ) 20 MG tablet Take torsemide  on alternating days 40 mg one day (2 tablets), then the next day 20 mg (1 tablet) Patient  taking differently: Take 20 mg by mouth daily. 09/21/23   Madireddy, Alean SAUNDERS, MD  traMADol  (ULTRAM ) 50 MG tablet Take 1 tablet (50 mg total) by mouth every 6 (six) hours as needed. 07/08/24       Allergies: Codeine, Tape, and Oxycodone     Review of Systems  Musculoskeletal:  Positive for back pain.  All other systems reviewed and are negative.   Updated Vital Signs BP (!) 152/98 (BP Location: Right Arm)   Pulse 71   Temp 98 F (36.7 C) (Oral)   Resp (!) 22   SpO2 100%   Physical Exam Vitals and nursing note reviewed.   79 year old female, resting comfortably and in no acute distress. Vital signs are significant for elevated blood pressure and slightly elevated respiratory rate. Oxygen saturation is 100%, which is normal. Head is normocephalic and atraumatic. PERRLA, EOMI.  Lungs are clear without rales, wheezes, or rhonchi. Heart has regular rate and rhythm without murmur. Abdomen is soft, flat, nontender. Extremities have no cyanosis or edema, full range of motion is present. Skin is warm and dry without rash. Neurologic: Awake and alert and oriented, moves all extremities equally.  (all labs ordered are listed, but only abnormal results are displayed) Labs Reviewed  PRO BRAIN NATRIURETIC PEPTIDE - Abnormal; Notable for the following components:      Result Value   Pro Brain Natriuretic Peptide >35,000.0 (*)    All other components within normal limits  CBC - Abnormal; Notable for the following components:   Hemoglobin 11.8 (*)    MCHC 29.1 (*)    RDW 21.0 (*)    Platelets 106 (*)    All other components within normal limits  TROPONIN T, HIGH SENSITIVITY - Abnormal; Notable for the following components:   Troponin T High Sensitivity 29 (*)    All other components within normal limits  RESP PANEL BY RT-PCR (RSV, FLU A&B, COVID)  RVPGX2  BASIC METABOLIC PANEL WITH GFR  TROPONIN T, HIGH SENSITIVITY    EKG: ED ECG REPORT   Date: 07/10/2024 20:37:21  Rate: 68   Rhythm: normal sinus rhythm  QRS Axis: left  Intervals: normal  ST/T Wave abnormalities: normal  Conduction Disutrbances:left anterior fascicular block  Narrative Interpretation: Anterior fascicular block, possible old anterior infarct.  When compared with ECG of 05/26/2024, no significant changes are seen.  Old EKG Reviewed: unchanged  I have personally reviewed the EKG tracing and agree with the computerized printout as noted.   ED ECG REPORT   Date: 07/10/2024 22:10:02  Rate: 90  Rhythm: normal sinus rhythm  QRS Axis: left  Intervals: normal  ST/T Wave abnormalities: normal  Conduction Disutrbances:left anterior fascicular block  Narrative Interpretation: Sinus rhythm with probable left anterior fascicular block.  When compared with ECG of earlier today, no significant changes  are seen.  Old EKG Reviewed: unchanged  I have personally reviewed the EKG tracing and agree with the computerized printout as noted. Radiology: DG Chest 2 View Result Date: 07/09/2024 EXAM: 2 VIEW(S) XRAY OF THE CHEST 07/09/2024 08:45:00 PM COMPARISON: 05/17/2024 CLINICAL HISTORY: FINDINGS: LUNGS AND PLEURA: No focal pulmonary opacity. Trace bilateral pleural effusions. No pneumothorax. HEART AND MEDIASTINUM: Cardiomegaly. Atherosclerotic calcifications. BONES AND SOFT TISSUES: Vertebral augmentation of upper lumbar spine vertebral body. Old healed fracture of proximal left humerus. Vascular coiling within the abdomen. IMPRESSION: 1. Cardiomegaly. 2. Trace bilateral pleural effusions. Electronically signed by: Morgane Naveau MD 07/09/2024 08:50 PM EST RP Workstation: [id number]     Procedures   Medications Ordered in the ED  HYDROcodone -acetaminophen  (NORCO/VICODIN) 5-325 MG per tablet 1 tablet (1 tablet Oral Given 07/10/24 0429)  furosemide  (LASIX ) injection 40 mg (40 mg Intravenous Given 07/10/24 0429)                                    Medical Decision Making Risk Prescription drug  management.   Shortness of breath and patient with history of heart failure and currently anticoagulated on apixaban .  Because of anticoagulation, pulmonary embolism is very unlikely.  Differential diagnosis includes, but is not limited to, heart failure exacerbation, ACS, pneumonia, viral respiratory tract infection.  Chest x-ray shows cardiomegaly and trace bilateral pleural effusions.  Have independently viewed the images, and agree with the radiologist's interpretation.  I have reviewed her laboratory tests, my interpretation is negative PCR for COVID-19 and influenza and RSV, thrombocytopenia which is new, mild anemia improved compared with baseline, normal basic metabolic panel, mildly elevated troponin, markedly elevated proBNP.  I have reviewed her past records, and on 05/17/2024 proBNP was only mildly elevated.  This is strongly suggestive of of heart failure as the cause of her shortness of breath.  Echocardiogram in 04/22/2024 showed left ventricular ejection fraction 60-65% but diastolic parameters were not able to be recorded.  I have ordered a dose of furosemide  intravenously.  Troponin will need to be repeated to see if it is rising.  Repeat troponin has risen minimally.  She had excellent diuresis and is breathing much better.  She only ambulates at home when physical therapy is there, I feel she is safe for discharge.  She is currently taking daily torsemide , I have asked her to double her dose for the next 5 days, and follow-up with cardiology.     Final diagnoses:  Acute on chronic congestive heart failure, unspecified heart failure type (HCC)  Elevated troponin  Chronic midline back pain, unspecified back location    ED Discharge Orders     None          Raford Lenis, MD 07/10/24 [phone]  "

## 2024-07-10 NOTE — Discharge Instructions (Signed)
 Increase your torsemide  to 40 mg (2 tablets) each day for the next 5 days.  After that, work with your cardiologist to adjust your torsemide  dose.  Return to the emergency department if symptoms or not being adequately controlled at home.

## 2024-07-11 ENCOUNTER — Other Ambulatory Visit (HOSPITAL_BASED_OUTPATIENT_CLINIC_OR_DEPARTMENT_OTHER): Payer: Self-pay

## 2024-07-15 ENCOUNTER — Other Ambulatory Visit: Payer: Self-pay

## 2024-07-16 ENCOUNTER — Other Ambulatory Visit (HOSPITAL_BASED_OUTPATIENT_CLINIC_OR_DEPARTMENT_OTHER): Payer: Self-pay

## 2024-07-17 ENCOUNTER — Encounter (HOSPITAL_BASED_OUTPATIENT_CLINIC_OR_DEPARTMENT_OTHER): Payer: Self-pay

## 2024-07-17 ENCOUNTER — Other Ambulatory Visit (HOSPITAL_BASED_OUTPATIENT_CLINIC_OR_DEPARTMENT_OTHER): Payer: Self-pay

## 2024-07-17 ENCOUNTER — Telehealth (HOSPITAL_BASED_OUTPATIENT_CLINIC_OR_DEPARTMENT_OTHER): Payer: Self-pay

## 2024-07-17 ENCOUNTER — Other Ambulatory Visit (HOSPITAL_BASED_OUTPATIENT_CLINIC_OR_DEPARTMENT_OTHER): Payer: Self-pay | Admitting: Student

## 2024-07-17 DIAGNOSIS — G8929 Other chronic pain: Secondary | ICD-10-CM

## 2024-07-17 MED ORDER — TRAMADOL HCL 50 MG PO TABS
100.0000 mg | ORAL_TABLET | Freq: Four times a day (QID) | ORAL | 0 refills | Status: AC | PRN
  Filled 2024-07-17: qty 120, 15d supply, fill #0

## 2024-07-17 NOTE — Telephone Encounter (Signed)
 Copied from CRM #[id number]. Topic: Clinical - Medication Refill >> Jul 17, 2024  9:29 AM Willma R wrote: Medication: traMADol  (ULTRAM ) 50 MG tablet  Has the patient contacted their pharmacy? yes  This is the patient's preferred pharmacy:  [facility] [address] [place] [address] Phone: [phone] Fax: [phone]  Is this the correct pharmacy for this prescription? Yes  Has the prescription been filled recently? No  Is the patient out of the medication? Yes  Has the patient been seen for an appointment in the last year OR does the patient have an upcoming appointment? Yes  Can we respond through MyChart? No call daughter [name] at [phone]  Patient seen her pain management Dr yesterday who advised she contact her PCP and request a refill.  Agent: Please be advised that Rx refills may take up to 3 business days. We ask that you follow-up with your pharmacy.     LMTCB for [employer]

## 2024-07-17 NOTE — Telephone Encounter (Signed)
 Source  Peggy Armstrong (Patient)   Subject  Peggy Armstrong (Patient)   Topic  General - Call Back - No Documentation    Communication  Reason for CRM: [name] called back to speak with Jillane Po. Called [facility] and was advised that Carlie is working at another site. But, [name] stated that pain management told her mom that [name] would need to refill pain med tramadol  since he's done it before.       [phone] (M)

## 2024-07-22 ENCOUNTER — Other Ambulatory Visit (HOSPITAL_BASED_OUTPATIENT_CLINIC_OR_DEPARTMENT_OTHER): Payer: Self-pay

## 2024-07-23 ENCOUNTER — Other Ambulatory Visit (HOSPITAL_BASED_OUTPATIENT_CLINIC_OR_DEPARTMENT_OTHER): Payer: Self-pay | Admitting: Student

## 2024-07-23 ENCOUNTER — Other Ambulatory Visit (HOSPITAL_BASED_OUTPATIENT_CLINIC_OR_DEPARTMENT_OTHER): Payer: Self-pay

## 2024-07-23 DIAGNOSIS — M1A9XX0 Chronic gout, unspecified, without tophus (tophi): Secondary | ICD-10-CM

## 2024-07-23 MED ORDER — ALLOPURINOL 300 MG PO TABS
300.0000 mg | ORAL_TABLET | Freq: Every day | ORAL | 0 refills | Status: AC
  Filled 2024-07-23: qty 90, 90d supply, fill #0

## 2024-07-29 ENCOUNTER — Other Ambulatory Visit (HOSPITAL_BASED_OUTPATIENT_CLINIC_OR_DEPARTMENT_OTHER): Payer: Self-pay

## 2024-08-02 ENCOUNTER — Ambulatory Visit (HOSPITAL_BASED_OUTPATIENT_CLINIC_OR_DEPARTMENT_OTHER)
Admission: EM | Admit: 2024-08-02 | Discharge: 2024-08-02 | Disposition: A | Discharge status: Home / Self Care | Source: Home / Self Care

## 2024-08-02 ENCOUNTER — Encounter (HOSPITAL_BASED_OUTPATIENT_CLINIC_OR_DEPARTMENT_OTHER): Payer: Self-pay

## 2024-08-02 ENCOUNTER — Telehealth: Admitting: Family Medicine

## 2024-08-02 DIAGNOSIS — M109 Gout, unspecified: Secondary | ICD-10-CM

## 2024-08-02 MED ORDER — PREDNISONE 50 MG PO TABS: ORAL_TABLET | ORAL | 0 refills | Status: AC

## 2024-08-02 MED ORDER — PREDNISONE 20 MG PO TABS: 20.0000 mg | ORAL_TABLET | Freq: Two times a day (BID) | ORAL | 0 refills | Status: DC

## 2024-08-02 NOTE — Discharge Instructions (Addendum)
 Return if any problems.

## 2024-08-02 NOTE — Progress Notes (Signed)
 E-Visit for Gout Symptoms  We are sorry that you are not feeling well. We are here to help!  Based on what you shared with me it looks like you have a flare of your gout.  Gout is a form of arthritis. It can cause pain and swelling in the joints. At first, it tends to affect only 1 joint - most frequently the big toe. It happens in people who have too much uric acid in the blood. Uric acid is a chemical that is produced when the body breaks down certain foods. Uric acid can form sharp needle-like crystals that build up in the joints and cause pain. Uric acid crystals can also form inside the tubes that carry urine from the kidneys to the bladder. These crystals can turn into kidney stones that can cause pain and problems with the flow of urine. People with gout get sudden flares or attacks of severe pain, most often the big toe, ankle, or knee. Often the joint also turns red and swells. Usually, only 1 joint is affected, but some people have pain in more than 1 joint. Gout flares tend to happen more often during the night.  The pain from gout can be extreme. The pain and swelling are worst at the beginning of a gout flare. The symptoms then get better within a few days to weeks. It is not clear how the body turns off a gout flare.  Do not start any NEW preventative medicine until the gout has cleared completely. However, If you are already on Probenecid or Allopurinol  for CHRONIC gout, you may continue taking this during an active flare up     HOME CARE Losing weight can help relieve gout. It's not clear that following a specific diet plan will help with gout symptoms but eating a balanced diet can help improve your overall health. It can also help you lose weight, if you are overweight. In general, a healthy diet includes plenty of fruits, vegetables, whole grains, and low-fat dairy products (labelled low fat, skim, 2%). Avoid sugar sweetened drinks (including sodas, tea, juice and juice  blends, coffee drinks and sports drinks) Limit alcohol to 1-2 drinks of beer, spirits or wine daily these can make gout flares worse. Some people with gout also have other health problems, such as heart disease, high blood pressure, kidney disease, or obesity. If you have any of these issues, it's important to work with your doctor to manage them. This can help improve your overall health and might also help with your gout.  GET HELP RIGHT AWAY IF: Your symptoms persist after you have completed your treatment plan You develop severe diarrhea You develop abnormal sensations  You develop vomiting,   You develop weakness  You develop abdominal pain  FOLLOW UP WITH YOUR PRIMARY PROVIDER IF: If your symptoms do not improve within 10 days  MAKE SURE YOU  Understand these instructions. Will watch your condition. Will get help right away if you are not doing well or get worse.  Thank you for choosing an e-visit.  Your e-visit answers were reviewed by a board certified advanced clinical practitioner to complete your personal care plan. Depending upon the condition, your plan could have included both over the counter or prescription medications.  Please review your pharmacy choice. Make sure the pharmacy is open so you can pick up prescription now. If there is a problem, you may contact your provider through [employer] and have the prescription routed to another pharmacy.  Your  safety is important to us . If you have drug allergies check your prescription carefully.   For the next 24 hours you can use MyChart to ask questions about today's visit, request a non-urgent call back, or ask for a work or school excuse. You will get an email in the next two days asking about your experience. I hope that your e-visit has been valuable and will speed your recovery.  I have spent 5 minutes in review of e-visit questionnaire, review and updating patient chart, medical decision making and response to  patient.   Crystal Scarberry, FNP

## 2024-08-02 NOTE — ED Triage Notes (Signed)
 Pt c/o dull right knee pain starting this morning. Reports hx arthritis and weekly physical therapy since October- yesterday was the last day for the at home therapy. Denies known injury to cause the pain. States she sometimes has numbness and tingling in her leg/foot. Her last dose of tylenol  was at 2. She gets tramadol  for her back issues but her last dose was at 0600.

## 2024-08-03 ENCOUNTER — Ambulatory Visit (HOSPITAL_BASED_OUTPATIENT_CLINIC_OR_DEPARTMENT_OTHER)

## 2024-09-12 ENCOUNTER — Ambulatory Visit (HOSPITAL_BASED_OUTPATIENT_CLINIC_OR_DEPARTMENT_OTHER): Admitting: Student

## 2024-10-24 ENCOUNTER — Ambulatory Visit
